# Patient Record
Sex: Female | Born: 1970 | Race: Black or African American | Hispanic: No | Marital: Single | Smoking: Former smoker
Health system: Southern US, Community
[De-identification: ages and names within clinical notes are randomized; demographics above are authoritative.]

## PROBLEM LIST (undated history)

## (undated) DIAGNOSIS — F419 Anxiety disorder, unspecified: Secondary | ICD-10-CM

## (undated) DIAGNOSIS — F32A Depression, unspecified: Secondary | ICD-10-CM

## (undated) DIAGNOSIS — Z9989 Dependence on other enabling machines and devices: Secondary | ICD-10-CM

## (undated) DIAGNOSIS — E039 Hypothyroidism, unspecified: Secondary | ICD-10-CM

## (undated) DIAGNOSIS — G4733 Obstructive sleep apnea (adult) (pediatric): Secondary | ICD-10-CM

## (undated) DIAGNOSIS — M1990 Unspecified osteoarthritis, unspecified site: Secondary | ICD-10-CM

## (undated) DIAGNOSIS — D649 Anemia, unspecified: Secondary | ICD-10-CM

## (undated) DIAGNOSIS — K219 Gastro-esophageal reflux disease without esophagitis: Secondary | ICD-10-CM

## (undated) DIAGNOSIS — Z973 Presence of spectacles and contact lenses: Secondary | ICD-10-CM

## (undated) DIAGNOSIS — R51 Headache: Secondary | ICD-10-CM

## (undated) DIAGNOSIS — R0602 Shortness of breath: Secondary | ICD-10-CM

## (undated) DIAGNOSIS — Z9689 Presence of other specified functional implants: Secondary | ICD-10-CM

## (undated) DIAGNOSIS — R42 Dizziness and giddiness: Secondary | ICD-10-CM

## (undated) DIAGNOSIS — Z8709 Personal history of other diseases of the respiratory system: Secondary | ICD-10-CM

## (undated) DIAGNOSIS — F329 Major depressive disorder, single episode, unspecified: Secondary | ICD-10-CM

## (undated) DIAGNOSIS — I209 Angina pectoris, unspecified: Secondary | ICD-10-CM

## (undated) DIAGNOSIS — I10 Essential (primary) hypertension: Secondary | ICD-10-CM

## (undated) HISTORY — PX: SHOULDER SURGERY: SHX246

## (undated) HISTORY — PX: CHOLECYSTECTOMY: SHX55

## (undated) HISTORY — PX: ROTATOR CUFF REPAIR: SHX139

## (undated) HISTORY — PX: KNEE ARTHROSCOPY: SHX127

## (undated) HISTORY — PX: ENDOMETRIAL ABLATION: SHX621

## (undated) HISTORY — PX: BACK SURGERY: SHX140

---
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
## 1994-09-17 HISTORY — PX: TUBAL LIGATION: SHX77

## 1998-12-21 ENCOUNTER — Ambulatory Visit (HOSPITAL_BASED_OUTPATIENT_CLINIC_OR_DEPARTMENT_OTHER): Admission: RE | Admit: 1998-12-21 | Discharge: 1998-12-21 | Payer: Self-pay | Admitting: Orthopedic Surgery

## 1999-02-22 ENCOUNTER — Other Ambulatory Visit: Admission: RE | Admit: 1999-02-22 | Discharge: 1999-02-22 | Payer: Self-pay | Admitting: Family Medicine

## 1999-04-10 ENCOUNTER — Other Ambulatory Visit: Admission: RE | Admit: 1999-04-10 | Discharge: 1999-04-10 | Payer: Self-pay | Admitting: Family Medicine

## 2003-11-29 ENCOUNTER — Emergency Department (HOSPITAL_COMMUNITY): Admission: EM | Admit: 2003-11-29 | Discharge: 2003-11-29 | Payer: Self-pay | Admitting: Emergency Medicine

## 2006-12-17 ENCOUNTER — Encounter: Admission: RE | Admit: 2006-12-17 | Discharge: 2007-01-28 | Payer: Self-pay | Admitting: Orthopedic Surgery

## 2008-12-18 ENCOUNTER — Emergency Department (HOSPITAL_COMMUNITY): Admission: EM | Admit: 2008-12-18 | Discharge: 2008-12-18 | Payer: Self-pay | Admitting: Emergency Medicine

## 2009-02-12 ENCOUNTER — Inpatient Hospital Stay (HOSPITAL_COMMUNITY): Admission: EM | Admit: 2009-02-12 | Discharge: 2009-02-14 | Payer: Self-pay | Admitting: Emergency Medicine

## 2009-02-12 ENCOUNTER — Ambulatory Visit: Payer: Self-pay | Admitting: Internal Medicine

## 2009-02-12 ENCOUNTER — Emergency Department (HOSPITAL_COMMUNITY): Admission: EM | Admit: 2009-02-12 | Discharge: 2009-02-12 | Payer: Self-pay | Admitting: Emergency Medicine

## 2009-05-22 ENCOUNTER — Emergency Department (HOSPITAL_COMMUNITY): Admission: EM | Admit: 2009-05-22 | Discharge: 2009-05-22 | Payer: Self-pay | Admitting: Emergency Medicine

## 2009-06-28 ENCOUNTER — Emergency Department (HOSPITAL_COMMUNITY): Admission: EM | Admit: 2009-06-28 | Discharge: 2009-06-28 | Payer: Self-pay | Admitting: Emergency Medicine

## 2009-08-18 ENCOUNTER — Emergency Department (HOSPITAL_COMMUNITY): Admission: EM | Admit: 2009-08-18 | Discharge: 2009-08-18 | Payer: Self-pay | Admitting: Emergency Medicine

## 2009-09-17 HISTORY — PX: JOINT REPLACEMENT: SHX530

## 2009-10-07 ENCOUNTER — Other Ambulatory Visit: Admission: RE | Admit: 2009-10-07 | Discharge: 2009-10-07 | Payer: Self-pay | Admitting: Obstetrics and Gynecology

## 2009-10-13 ENCOUNTER — Ambulatory Visit (HOSPITAL_COMMUNITY): Admission: RE | Admit: 2009-10-13 | Discharge: 2009-10-13 | Payer: Self-pay | Admitting: Obstetrics and Gynecology

## 2009-11-17 ENCOUNTER — Emergency Department (HOSPITAL_COMMUNITY): Admission: EM | Admit: 2009-11-17 | Discharge: 2009-11-17 | Payer: Self-pay | Admitting: Emergency Medicine

## 2009-11-21 ENCOUNTER — Encounter: Admission: RE | Admit: 2009-11-21 | Discharge: 2010-02-19 | Payer: Self-pay | Admitting: Orthopedic Surgery

## 2009-12-20 ENCOUNTER — Ambulatory Visit (HOSPITAL_COMMUNITY): Admission: RE | Admit: 2009-12-20 | Discharge: 2009-12-20 | Payer: Self-pay | Admitting: Obstetrics and Gynecology

## 2010-03-15 ENCOUNTER — Emergency Department (HOSPITAL_COMMUNITY): Admission: EM | Admit: 2010-03-15 | Discharge: 2010-03-15 | Payer: Self-pay | Admitting: Emergency Medicine

## 2010-04-15 ENCOUNTER — Emergency Department (HOSPITAL_COMMUNITY): Admission: EM | Admit: 2010-04-15 | Discharge: 2010-04-15 | Payer: Self-pay | Admitting: Emergency Medicine

## 2010-05-29 ENCOUNTER — Encounter
Admission: RE | Admit: 2010-05-29 | Discharge: 2010-08-27 | Payer: Self-pay | Source: Home / Self Care | Admitting: Orthopedic Surgery

## 2010-06-27 ENCOUNTER — Encounter: Admission: RE | Admit: 2010-06-27 | Discharge: 2010-06-27 | Payer: Self-pay | Admitting: Orthopedic Surgery

## 2010-08-30 ENCOUNTER — Emergency Department (HOSPITAL_COMMUNITY)
Admission: EM | Admit: 2010-08-30 | Discharge: 2010-08-31 | Payer: Self-pay | Source: Home / Self Care | Admitting: Emergency Medicine

## 2010-09-20 ENCOUNTER — Encounter
Admission: RE | Admit: 2010-09-20 | Discharge: 2010-10-17 | Payer: Self-pay | Source: Home / Self Care | Attending: Orthopedic Surgery | Admitting: Orthopedic Surgery

## 2010-11-10 ENCOUNTER — Other Ambulatory Visit: Payer: Self-pay | Admitting: Orthopedic Surgery

## 2010-11-10 DIAGNOSIS — M25512 Pain in left shoulder: Secondary | ICD-10-CM

## 2010-11-12 ENCOUNTER — Ambulatory Visit
Admission: RE | Admit: 2010-11-12 | Discharge: 2010-11-12 | Disposition: A | Discharge status: Home / Self Care | Payer: Medicaid Other | Source: Ambulatory Visit | Attending: Orthopedic Surgery | Admitting: Orthopedic Surgery

## 2010-11-12 DIAGNOSIS — M25512 Pain in left shoulder: Secondary | ICD-10-CM

## 2010-11-27 LAB — CBC
HCT: 35.9 % — ABNORMAL LOW (ref 36.0–46.0)
Hemoglobin: 11.9 g/dL — ABNORMAL LOW (ref 12.0–15.0)
MCH: 26.8 pg (ref 26.0–34.0)
MCHC: 33.1 g/dL (ref 30.0–36.0)
MCV: 80.9 fL (ref 78.0–100.0)
Platelets: 396 10*3/uL (ref 150–400)
RBC: 4.44 MIL/uL (ref 3.87–5.11)
RDW: 14.9 % (ref 11.5–15.5)
WBC: 8 10*3/uL (ref 4.0–10.5)

## 2010-11-27 LAB — BASIC METABOLIC PANEL
BUN: 9 mg/dL (ref 6–23)
CO2: 27 mEq/L (ref 19–32)
Calcium: 9.2 mg/dL (ref 8.4–10.5)
Chloride: 98 mEq/L (ref 96–112)
Creatinine, Ser: 0.95 mg/dL (ref 0.4–1.2)
GFR calc Af Amer: >60 mL/min (ref >60)
GFR calc non Af Amer: >60 mL/min (ref >60)
Glucose, Bld: 101 mg/dL — ABNORMAL HIGH (ref 70–99)
Potassium: 2.7 mEq/L — CL (ref 3.5–5.1)
Sodium: 137 mEq/L (ref 135–145)

## 2010-11-27 LAB — DIFFERENTIAL
Basophils Absolute: 0 10*3/uL (ref 0.0–0.1)
Basophils Relative: 0 % (ref 0–1)
Eosinophils Absolute: 0.3 10*3/uL (ref 0.0–0.7)
Eosinophils Relative: 4 % (ref 0–5)
Lymphocytes Relative: 39 % (ref 12–46)
Lymphs Abs: 3.1 10*3/uL (ref 0.7–4.0)
Monocytes Absolute: 0.3 10*3/uL (ref 0.1–1.0)
Monocytes Relative: 4 % (ref 3–12)
Neutro Abs: 4.3 10*3/uL (ref 1.7–7.7)
Neutrophils Relative %: 54 % (ref 43–77)

## 2010-12-06 LAB — CBC
HCT: 34.5 % — ABNORMAL LOW (ref 36.0–46.0)
Hemoglobin: 11.4 g/dL — ABNORMAL LOW (ref 12.0–15.0)
MCHC: 33.1 g/dL (ref 30.0–36.0)
MCV: 81.1 fL (ref 78.0–100.0)
Platelets: 339 10*3/uL (ref 150–400)
RBC: 4.25 MIL/uL (ref 3.87–5.11)
RDW: 15 % (ref 11.5–15.5)
WBC: 6.8 10*3/uL (ref 4.0–10.5)

## 2010-12-06 LAB — BASIC METABOLIC PANEL
BUN: 10 mg/dL (ref 6–23)
CO2: 26 mEq/L (ref 19–32)
Calcium: 9.3 mg/dL (ref 8.4–10.5)
Chloride: 103 mEq/L (ref 96–112)
Creatinine, Ser: 0.94 mg/dL (ref 0.4–1.2)
GFR calc Af Amer: >60 mL/min (ref >60)
GFR calc non Af Amer: >60 mL/min (ref >60)
Glucose, Bld: 99 mg/dL (ref 70–99)
Potassium: 3.4 mEq/L — ABNORMAL LOW (ref 3.5–5.1)
Sodium: 138 mEq/L (ref 135–145)

## 2010-12-06 LAB — HCG, QUANTITATIVE, PREGNANCY: hCG, Beta Chain, Quant, S: <2 m[IU]/mL (ref <5)

## 2010-12-19 LAB — BASIC METABOLIC PANEL
BUN: 14 mg/dL (ref 6–23)
CO2: 27 mEq/L (ref 19–32)
Calcium: 9.3 mg/dL (ref 8.4–10.5)
Chloride: 102 mEq/L (ref 96–112)
Creatinine, Ser: 1.02 mg/dL (ref 0.4–1.2)
GFR calc Af Amer: >60 mL/min (ref >60)
GFR calc non Af Amer: >60 mL/min (ref >60)
Glucose, Bld: 111 mg/dL — ABNORMAL HIGH (ref 70–99)
Potassium: 3.9 mEq/L (ref 3.5–5.1)
Sodium: 137 mEq/L (ref 135–145)

## 2010-12-19 LAB — POCT CARDIAC MARKERS
CKMB, poc: <1 ng/mL — ABNORMAL LOW (ref 1.0–8.0)
Myoglobin, poc: 72.8 ng/mL (ref 12–200)
Troponin i, poc: <0.05 ng/mL (ref 0.00–0.09)

## 2010-12-21 LAB — URINALYSIS, ROUTINE W REFLEX MICROSCOPIC
Bilirubin Urine: NEGATIVE
Glucose, UA: NEGATIVE mg/dL
Hgb urine dipstick: NEGATIVE
Ketones, ur: NEGATIVE mg/dL
Nitrite: NEGATIVE
Protein, ur: NEGATIVE mg/dL
Specific Gravity, Urine: 1.025 (ref 1.005–1.030)
Urobilinogen, UA: 0.2 mg/dL (ref 0.0–1.0)
pH: 5.5 (ref 5.0–8.0)

## 2010-12-21 LAB — URINE MICROSCOPIC-ADD ON

## 2010-12-26 LAB — URINALYSIS, ROUTINE W REFLEX MICROSCOPIC
Bilirubin Urine: NEGATIVE
Glucose, UA: NEGATIVE mg/dL
Ketones, ur: 40 mg/dL — AB
Protein, ur: NEGATIVE mg/dL
Urobilinogen, UA: 0.2 mg/dL (ref 0.0–1.0)

## 2010-12-26 LAB — CBC
HCT: 32.7 % — ABNORMAL LOW (ref 36.0–46.0)
HCT: 32.3 % — ABNORMAL LOW (ref 36.0–46.0)
Hemoglobin: 11.1 g/dL — ABNORMAL LOW (ref 12.0–15.0)
MCHC: 33.9 g/dL (ref 30.0–36.0)
MCHC: 33.3 g/dL (ref 30.0–36.0)
MCV: 79.1 fL (ref 78.0–100.0)
MCV: 79.5 fL (ref 78.0–100.0)
Platelets: 296 10*3/uL (ref 150–400)
Platelets: 298 10*3/uL (ref 150–400)
RBC: 4.08 MIL/uL (ref 3.87–5.11)
RBC: 4.08 MIL/uL (ref 3.87–5.11)
RDW: 15.3 % (ref 11.5–15.5)
WBC: 13.2 10*3/uL — ABNORMAL HIGH (ref 4.0–10.5)

## 2010-12-26 LAB — COMPREHENSIVE METABOLIC PANEL
AST: 16 U/L (ref 0–37)
Albumin: 3.3 g/dL — ABNORMAL LOW (ref 3.5–5.2)
Alkaline Phosphatase: 59 U/L (ref 39–117)
BUN: 10 mg/dL (ref 6–23)
BUN: 9 mg/dL (ref 6–23)
CO2: 24 mEq/L (ref 19–32)
Calcium: 9 mg/dL (ref 8.4–10.5)
Calcium: 8.5 mg/dL (ref 8.4–10.5)
Chloride: 104 mEq/L (ref 96–112)
Creatinine, Ser: 0.93 mg/dL (ref 0.4–1.2)
GFR calc Af Amer: >60 mL/min (ref >60)
GFR calc non Af Amer: >60 mL/min (ref >60)
Glucose, Bld: 98 mg/dL (ref 70–99)
Potassium: 3.7 mEq/L (ref 3.5–5.1)
Total Bilirubin: 1 mg/dL (ref 0.3–1.2)
Total Protein: 7.7 g/dL (ref 6.0–8.3)
Total Protein: 7.9 g/dL (ref 6.0–8.3)

## 2010-12-26 LAB — C4 COMPLEMENT: Complement C4, Body Fluid: 29 mg/dL (ref 16–47)

## 2010-12-26 LAB — BASIC METABOLIC PANEL
BUN: 12 mg/dL (ref 6–23)
CO2: 24 mEq/L (ref 19–32)
Calcium: 8.4 mg/dL (ref 8.4–10.5)
Chloride: 107 mEq/L (ref 96–112)
Creatinine, Ser: 0.85 mg/dL (ref 0.4–1.2)

## 2010-12-26 LAB — GONOCOCCUS CULTURE

## 2010-12-26 LAB — URINE CULTURE: Colony Count: 40000

## 2010-12-26 LAB — HIV ANTIBODY (ROUTINE TESTING W REFLEX): HIV: NONREACTIVE

## 2010-12-26 LAB — DIFFERENTIAL
Basophils Relative: 0 % (ref 0–1)
Eosinophils Absolute: 0.1 10*3/uL (ref 0.0–0.7)
Monocytes Relative: 3 % (ref 3–12)
Neutro Abs: 10.4 10*3/uL — ABNORMAL HIGH (ref 1.7–7.7)
Neutrophils Relative %: 84 % — ABNORMAL HIGH (ref 43–77)

## 2010-12-26 LAB — URINE MICROSCOPIC-ADD ON

## 2010-12-26 LAB — GC/CHLAMYDIA PROBE AMP, URINE: Chlamydia, Swab/Urine, PCR: NEGATIVE

## 2010-12-26 LAB — PREGNANCY, URINE: Preg Test, Ur: NEGATIVE

## 2010-12-26 LAB — PROTIME-INR
INR: 1.1 (ref 0.00–1.49)
Prothrombin Time: 14.2 seconds (ref 11.6–15.2)

## 2011-01-26 DIAGNOSIS — R079 Chest pain, unspecified: Secondary | ICD-10-CM

## 2011-01-30 NOTE — Consult Note (Signed)
Marilyn [name], Marilyn [name]             ACCOUNT NO.:  [id number]   MEDICAL RECORD NO.:  [id number]          PATIENT TYPE:  INP   LOCATION:  3303                         FACILITY:  [facility]   PHYSICIAN:  Jefry H. Pollyann Kennedy, MD     DATE OF BIRTH:  11-18-1970   DATE OF CONSULTATION:  02/12/2009  DATE OF DISCHARGE:                                 CONSULTATION   REASON FOR CONSULTATION:  Possible epiglottitis.   HISTORY:  This is a 40 year old lady who for the past 3 days had  worsening sore throat and trouble swallowing.  She has had intermittent  dyspnea but has not been noted to have stridor.  She was sent from [facility] by ambulance.  She was keeping her saturations  of 100% on room air.  She denies fever.  She has been on lisinopril for  about a month.   EXAMINATION:  Obese lady in no distress.  She has a clear voice and no  inspiratory or expiratory stridor.  She is able to handle some  secretions.  She is sitting semi supine and breathing without  difficulty.   Lateral x-ray reveals some enlargement of the epiglottis.  There is no  obvious airway obstruction.   Flexible fiberoptic laryngoscopy was performed following topical Afrin  and Xylocaine application.  Examination reveals normal vocal cord  mobility without any obstruction of the airway.  There is pale edema of  the anterior surface of the epiglottis.  The laryngeal surface is  uninvolved.  Some similar edema of the vallecula mucosa.  There is no  pooling of secretions and no evidence of airway obstruction.  There are  no palpable neck masses.   IMPRESSION:  Angioedema probably ACE inhibitor related.  Recommended  admission to the hospital for observation by the Medical Service and for  monitoring of blood pressure and for discontinuation of lisinopril and  starting on new antihypertensive medication.  I will be available for  consultation if there is any worsening airway concerns.  As long as she  gets  better, she can be discharged when we know that she is having no  worsening symptoms and no airway obstruction.  If the airway obstruction  does become more apparent and progressed, then surgical intervention may  be necessary.      Jefry H. Pollyann Kennedy, MD  Electronically Signed     JHR/MEDQ  D:  02/12/2009  T:  02/13/2009  Job:  [id number]

## 2011-01-30 NOTE — Discharge Summary (Signed)
Marilyn [name], Marilyn [name]             ACCOUNT NO.:  [id number]   MEDICAL RECORD NO.:  [id number]          PATIENT TYPE:  INP   LOCATION:  5504                         FACILITY:  [facility]   PHYSICIAN:  Marilyn Dickens, Marilyn [name]     DATE OF BIRTH:  02/01/1971   DATE OF ADMISSION:  02/12/2009  DATE OF DISCHARGE:  02/14/2009                               DISCHARGE SUMMARY   DISCHARGE DIAGNOSES:  1. Angioedema, likely secondary to lisinopril.  2. Trichomonas infection in urine.  3. Hypertension.  4. Sore throat.  5. Anemia.  6. Asthma.   DISPOSITION AND FOLLOWUP:  The patient to follow up with her primary  care doctor at the [facility] within next week.  The patient is instructed to go to her primary care Marilyn [name] for  restarting her blood pressure medications.  The patient instructed to  not take lisinopril-like medication due to possible reaction that could  be life-threatening.   DISCHARGE MEDICATION:  1. Prednisone taper over next 6 days 10 mg 21 pack.  2. Advair 250/50 one puff twice a day.  3. Albuterol inhaler 1-2 puffs every 6 hours as needed.  4. Augmentin 875 mg for 7 days twice daily.  5. Benadryl 25 mg every 6-hour as needed.  6. Flagyl 500 mg total of 4 tablets take it all at one time.   The patient instructed to avoid sexual activity and completed treatment  of partner and await 1 week prior to resuming sexual activity.   PROCEDURES PERFORMED:  Flexible fiberoptic laryngoscopy done by Dr.  Pollyann [name] on Feb 12, 2009, examination showed normal vocal cord mobility  without any obstruction of the airway, pale edema of anterior surface of  epiglottis.  Laryngeal surface in uninvolved.   Neck x-ray soft tissue with thickened epiglottitis worrisome for  epiglottitis.  Chest x-ray on Feb 12, 2009, no acute disease.   CONSULTATIONS:  Marilyn H. Marilyn Kennedy, Marilyn [name], ENT   ADMITTING HISTORY AND PHYSICAL:  The patient is a 40 year old African  American female with past  medical history of asthma since childhood;  hypertension, treated intermittently, recent treatment for community-  acquired pneumonia with azithromycin about a month prior to this  presentation.  The patient presented on this occasion with complaint of  increasing throat pain and feeling of choking.  The patient started  having some throat pain and sore-throat like symptoms about a week prior  to this admission.  This pain was intermittent and very minimal in the  initial stage.  The pain gradually started to progress.  The patient  went to bed night prior to admission without significant symptoms.  However, she woke up around 2 in the night with severe shortness of  breath and feeling of choking.  The patient also has severe throat pain.  The patient also reported neck swelling.  The patient attempted taking  albuterol inhaler, which did not produce any relief.  The patient went  to [facility] Emergency Department and was further evaluated.  The  patient was thought to have epiglottitis with soft tissue swelling  appreciated on neck x-ray.  The patient was  therefore transferred to  [facility] Emergency Department.  The patient was evaluated promptly by  Dr. Pollyann [name] from ENT as well as the emergency room physician.  The patient  denied taking any new medications except prescribed by her physician.  The patient did not have significant history of food allergies; however  the patient does have severe reaction to tomatoes.  The patient did not  report any intermittent ingestion or accidental exposure.   PHYSICAL EXAMINATION:  VITAL SIGNS:  On admission; temperature 100.9,  blood pressure 136/80, pulse of 101, respiratory rate of 22, and O2  saturation 100% on room air.  GENERAL:  The patient was not in acute distress.  EYES:  Extraocular movements intact.  Pupils were round and reactive to  light.  No icterus.  ENT:  The patient had a soft, mild edematous neck.  No lymphadenopathy.  The  patient's oral mucosa was pink, but dry.  The patient had some  ulceration on posterior pharyngeal wall and hemorrhages.  The patient's  uvula was in middle without any gross swelling.  The patient did not  have tonsillar enlargement. There was contusion bilaterally.  RESPIRATORY:  No accessory muscles were used.  The patient spoke in full  sentences without any distress.  CARDIOVASCULAR:  S1 and S2 normal.  No murmur.  No regurgitation.  ABDOMEN:  Soft, nontender, and obese.  No hepatosplenomegaly.  EXTREMITIES:  No edema, cyanosis, or clubbing.  SKIN:  No itching.  No rash.  LYMPH NODE:  No lymphadenopathy.  MUSCULOSKELETAL:  No joint swelling or pain or redness.  NEUROLOGICAL:  Alert and oriented x3.  Cranial nerves II through XII are  intact.  No focal deficit.  PSYCH:  Appropriate.   The patient was on admission, white count of 12.3, hemoglobin of 11.1,  hematocrit of 32.7, MCV of 80.1, and platelet count 284.  Sodium 136,  potassium 3.7, chloride 103, bicarb 24, BUN 10, creatinine 0.96, and  glucose of 98.  AST of 14, ALT of 12, total protein 7.7, albumin of 3.4,  calcium 9.0, total bilirubin 0.8, and alk phosphatase 59.  PT was 14.2  and INR was 1.1.  Urine hCG pregnancy test negative.  Urinalysis  revealed negative for bilirubin, 40 of ketones, trace blood,  urobilirubin of 0.2, positive for nitrite and small leukocyte esterase.  Urine microscopy showed 3-6 wbc per HPF.  Urine also showed mucous with  Trichomonas.  C4 complement level was 29.  HIV was nonreactive.  Urine  culture was pending.  Rapid strep antigen test was negative.   HOSPITAL COURSE:  1. Angioedema.  The patient on admission had throat pain and swelling,      which was evaluated by Dr. Pollyann [name].  His optical laryngoscopy      findings were consistent with angioedema and not epiglottitis.  The      patient was given intravenous steroids with decongestant.  The      patient was provided antihistamines and  lisinopril was held with      presumptive diagnosis of angioedema, secondary to lisinopril.  The      patient responded to this management appropriately.  At the time of      discharge, the patient had no shortness of breath.  No neck      swelling or pain on swallowing.  The patient was also treated with      Augmentin orally, which will be continued for 7 days at the time of  discharge.  The patient now instructed not to take angiotensin      receptor inhibitor as well as ACE inhibitors.  2. Hypertension.  The patient was not continued on any hypertensive      medications during this hospitalization.  The patient's blood      pressure was normotensive.  The patient on discharge instructed to      follow up with primary care Marilyn [name] for restarting her choice of      medication.  3. Anemia.  The patient likely has iron-deficiency anemia, secondary      to menstruation.  The patient was advised about iron      supplementation.  The patient wish to discuss this with her primary      care Marilyn [name].  Therefore, no iron supplementation will provided at      the time of discharge.  4. Trichomonas in urine.  The patient was positive for Trichomonas in      the urine.  The patient did not report significant urinary      symptoms.  The patient was treated with Flagyl 500 mg 4 tablets x1      at the time of discharge.  The patient was screened for gonorrhea      and chlamydia.  The results are pending, the patient was negative      for HIV.  The patient will be further contacted if any of the      screening test, positive for complete antibiotic therapy.  The      patient also instructed to inform her partner and not to resume      sexual activity at least for 1 week after completed therapy of her      and her partner.  The patient pregnancy test was negative.  5. Asthma.  The patient did not have significant wheezing on admission      or on physical exam.  The patient was continued on Advair  and      albuterol therapy.  The patient at the time of discharge was      instructed to continue this therapy.  6. Vitals at the time of discharge:  Temperature 97.1, pulse 87,      respirations of 16, blood pressure of 133/77, and 99% oxygen      saturation on room air.      Clerance Lav, MD PhD  Electronically Signed      Marilyn Dickens, Marilyn [name]  Electronically Signed    RS/MEDQ  D:  02/14/2009  T:  02/15/2009  Job:  [id number]   cc:   [facility]  Ihor Austin, Marilyn [name]  Jeannett Senior. Marilyn Kennedy, Marilyn [name]

## 2011-03-28 ENCOUNTER — Encounter: Payer: Self-pay | Admitting: *Deleted

## 2011-03-28 ENCOUNTER — Emergency Department (HOSPITAL_COMMUNITY)
Admission: EM | Admit: 2011-03-28 | Discharge: 2011-03-28 | Disposition: A | Discharge status: Home / Self Care | Payer: Medicaid Other | Attending: Emergency Medicine | Admitting: Emergency Medicine

## 2011-03-28 ENCOUNTER — Emergency Department (HOSPITAL_COMMUNITY): Payer: Medicaid Other

## 2011-03-28 ENCOUNTER — Other Ambulatory Visit: Payer: Self-pay

## 2011-03-28 DIAGNOSIS — I10 Essential (primary) hypertension: Secondary | ICD-10-CM | POA: Insufficient documentation

## 2011-03-28 DIAGNOSIS — J45909 Unspecified asthma, uncomplicated: Secondary | ICD-10-CM | POA: Insufficient documentation

## 2011-03-28 DIAGNOSIS — Z87891 Personal history of nicotine dependence: Secondary | ICD-10-CM | POA: Insufficient documentation

## 2011-03-28 DIAGNOSIS — R0789 Other chest pain: Secondary | ICD-10-CM

## 2011-03-28 DIAGNOSIS — R071 Chest pain on breathing: Secondary | ICD-10-CM | POA: Insufficient documentation

## 2011-03-28 HISTORY — DX: Essential (primary) hypertension: I10

## 2011-03-28 HISTORY — DX: Unspecified osteoarthritis, unspecified site: M19.90

## 2011-03-28 MED ORDER — KETOROLAC TROMETHAMINE 60 MG/2ML IM SOLN
60.0000 mg | Freq: Once | INTRAMUSCULAR | Status: AC
  Administered 2011-03-28: 60 mg via INTRAMUSCULAR
  Filled 2011-03-28: qty 2

## 2011-03-28 MED ORDER — IBUPROFEN 600 MG PO TABS: 600.0000 mg | ORAL_TABLET | Freq: Four times a day (QID) | ORAL | Status: AC | PRN

## 2011-03-28 NOTE — ED Notes (Signed)
C/o CP for 2 days

## 2011-03-28 NOTE — ED Notes (Signed)
Md at bs to evaluate.

## 2011-03-28 NOTE — ED Notes (Signed)
Pt states she has had intermittent right sided chest pain x 2 days. Radiates to center of chest. Denies anything making pain better or worse. Does hurt on palpation. Heart sounds strong, s1 and s2 present with no extra heart sounds. States she has had some shortness of breath with episodes. Appears to be slightly winded with work of breathing. Able to speak sentences. Placed on 2l o2 [place]. Lung sounds clear in all fields. Denies any n/v/d.

## 2011-03-28 NOTE — ED Notes (Signed)
Into room to see pt. Pt denies any pain at this time. Nad. Call bell at bs. Given sprite per request. Denies any other needs.

## 2011-03-28 NOTE — ED Notes (Signed)
Remains resting sitting up in bed. Nad. Denies any needs at this time. Call bell at bs. Pain 3/10. Resting comfortably.

## 2011-03-28 NOTE — ED Provider Notes (Signed)
History     Chief Complaint  Patient presents with  . Chest Pain  . Cough   HPI Comments: Pt with hx of asthma in the past presents with R sied parasternal shapr, intermittent CP, nothing makes better or worse, is havbing no pain while talking during HPI.  Denies leg swelling, fever, cough, vomiting or abd pain.  She has not had this pain in the past.  She has not had any problems with her heart or lungs other than well controlled asthma.  Pain started 2 days ago but got worse 1 hour ago.  Goes and comes spontaneously and is not made worse with braething, movement.  Patient is a 40 y.o. female presenting with chest pain and cough. The history is provided by the patient.  Chest Pain Pertinent negatives for primary symptoms include no fever, no shortness of breath, no cough, no abdominal pain, no nausea and no vomiting.  Pertinent negatives for associated symptoms include no numbness and no weakness.    Cough Associated symptoms include chest pain. Pertinent negatives include no chills, no headaches, no sore throat and no shortness of breath.    Past Medical History  Diagnosis Date  . Hypertension   . Arthritis   . Asthma     Past Surgical History  Procedure Date  . Cesarean section   . Cholecystectomy   . Tubal ligation   . Knee arthroscopy     History reviewed. No pertinent family history.  History  Substance Use Topics  . Smoking status: Former Games developer  . Smokeless tobacco: Not on file  . Alcohol Use: No    OB History    Grav Para Term Preterm Abortions TAB SAB Ect Mult Living                  Review of Systems  Constitutional: Negative for fever and chills.  HENT: Negative for sore throat and neck pain.   Eyes: Negative for visual disturbance.  Respiratory: Negative for cough and shortness of breath.   Cardiovascular: Positive for chest pain.  Gastrointestinal: Negative for nausea, vomiting, abdominal pain and diarrhea.  Genitourinary: Negative for dysuria  and frequency.  Musculoskeletal: Negative for back pain.  Skin: Negative for rash.  Neurological: Negative for weakness, numbness and headaches.  Hematological: Negative for adenopathy.  Psychiatric/Behavioral: Negative for behavioral problems.    Physical Exam  BP 182/89  Pulse 70  Temp(Src) 98.8 F (37.1 C) (Oral)  Resp 18  Ht 5\' 3"  (1.6 m)  Wt 291 lb (131.997 kg)  BMI 51.55 kg/m2  SpO2 100%  Physical Exam  Constitutional: She appears well-developed and well-nourished. No distress.  HENT:  Head: Normocephalic and atraumatic.  Mouth/Throat: Oropharynx is clear and moist. No oropharyngeal exudate.  Eyes: Conjunctivae and EOM are normal. Pupils are equal, round, and reactive to light. Right eye exhibits no discharge. Left eye exhibits no discharge. No scleral icterus.  Neck: Normal range of motion. Neck supple. No JVD present. No thyromegaly present.  Cardiovascular: Normal rate, regular rhythm, normal heart sounds and intact distal pulses.  Exam reveals no gallop and no friction rub.   No murmur heard. Pulmonary/Chest: Effort normal and breath sounds normal. No respiratory distress. She has no wheezes. She has no rales. Tenderness:  very ttp on the R chest wall in the mid R parasternal location.  nio rash at this location.  Abdominal: Soft. Bowel sounds are normal. She exhibits no distension and no mass. There is no tenderness.  Musculoskeletal: Normal range  of motion. She exhibits no edema and no tenderness.  Lymphadenopathy:    She has no cervical adenopathy.  Neurological: She is alert. Coordination normal.  Skin: Skin is warm and dry. No rash noted. She is not diaphoretic. No erythema.  Psychiatric: She has a normal mood and affect. Her behavior is normal.    ED Course  Procedures  MDM Pt has acute chest pain with palpation which she states is reproducible - the ECG is normal and shows no ischemia.  CXR r/o ptx, toradol IM.  Reeval.  8:13 PM    ED ECG REPORT    Date: 03/28/2011   Rate: 67  Rhythm: normal sinus rhythm  QRS Axis: normal  Intervals: normal  ST/T Wave abnormalities: normal  Conduction Disutrbances:none  Narrative Interpretation:   Old EKG Reviewed: none available   CXR without acute findings - will d/c home.  Vida Roller, MD 03/28/11 2131

## 2011-03-28 NOTE — ED Notes (Signed)
Resting sitting up in bed. Nad. Denies any needs at this time. Pain 3/10. Call bell at bs. Equal chest rise and fall with no respiratory distress. Will continue to monitor.

## 2011-03-28 NOTE — ED Notes (Signed)
rocephin

## 2011-04-12 ENCOUNTER — Encounter (HOSPITAL_COMMUNITY): Payer: Self-pay | Admitting: *Deleted

## 2011-04-12 ENCOUNTER — Emergency Department (HOSPITAL_COMMUNITY)
Admission: EM | Admit: 2011-04-12 | Discharge: 2011-04-13 | Discharge status: Against Medical Advice | Payer: Medicaid Other | Source: Home / Self Care

## 2011-04-12 DIAGNOSIS — F341 Dysthymic disorder: Secondary | ICD-10-CM | POA: Insufficient documentation

## 2011-04-12 DIAGNOSIS — T63461A Toxic effect of venom of wasps, accidental (unintentional), initial encounter: Secondary | ICD-10-CM | POA: Insufficient documentation

## 2011-04-12 DIAGNOSIS — I10 Essential (primary) hypertension: Secondary | ICD-10-CM | POA: Insufficient documentation

## 2011-04-12 DIAGNOSIS — M7989 Other specified soft tissue disorders: Secondary | ICD-10-CM | POA: Insufficient documentation

## 2011-04-12 DIAGNOSIS — T6391XA Toxic effect of contact with unspecified venomous animal, accidental (unintentional), initial encounter: Secondary | ICD-10-CM | POA: Insufficient documentation

## 2011-04-12 DIAGNOSIS — Z87891 Personal history of nicotine dependence: Secondary | ICD-10-CM | POA: Insufficient documentation

## 2011-04-12 DIAGNOSIS — J45909 Unspecified asthma, uncomplicated: Secondary | ICD-10-CM | POA: Insufficient documentation

## 2011-04-12 NOTE — ED Notes (Signed)
Reports stung by bee 2 days ago to right index finger; c/o increased pain and swelling spreading to hand

## 2011-04-13 ENCOUNTER — Emergency Department (HOSPITAL_COMMUNITY)
Admission: EM | Admit: 2011-04-13 | Discharge: 2011-04-13 | Disposition: A | Discharge status: Home / Self Care | Payer: Medicaid Other | Attending: Emergency Medicine | Admitting: Emergency Medicine

## 2011-04-13 ENCOUNTER — Encounter (HOSPITAL_COMMUNITY): Payer: Self-pay | Admitting: *Deleted

## 2011-04-13 DIAGNOSIS — T63441A Toxic effect of venom of bees, accidental (unintentional), initial encounter: Secondary | ICD-10-CM

## 2011-04-13 MED ORDER — METHYLPREDNISOLONE SODIUM SUCC 125 MG IJ SOLR
125.0000 mg | Freq: Once | INTRAMUSCULAR | Status: AC
  Administered 2011-04-13: 125 mg via INTRAMUSCULAR
  Filled 2011-04-13: qty 2

## 2011-04-13 MED ORDER — FEXOFENADINE HCL 180 MG PO TABS: ORAL_TABLET | ORAL | Status: DC

## 2011-04-13 MED ORDER — LORATADINE 10 MG PO TABS
10.0000 mg | ORAL_TABLET | Freq: Once | ORAL | Status: AC
  Administered 2011-04-13: 10 mg via ORAL
  Filled 2011-04-13: qty 1

## 2011-04-13 NOTE — ED Provider Notes (Signed)
Medical screening examination/treatment/procedure(s) were performed by non-physician practitioner and as supervising physician I was immediately available for consultation/collaboration.   Selin Eisler L Nader Boys, MD 04/13/11 2309 

## 2011-04-13 NOTE — ED Provider Notes (Signed)
History     Chief Complaint  Patient presents with  . Insect Bite   HPI Comments: Pt states she sustained a bee sting to her back and to the right index finger 7/24. She noted continued swelling of the left index finger and hand. She denies any SOB, hives, difficulty speaking, or LOC. She does note  Burning , itching, and swelling. The swollen area is aggravated by movement an palpation. Relieved by ice packs. She has taken benadryl with some relief, but became concerned that the hand and finger are still swollen 3 days after the sting.  The history is provided by the patient.    Past Medical History  Diagnosis Date  . Hypertension   . Arthritis   . Asthma     Past Surgical History  Procedure Date  . Cesarean section   . Cholecystectomy   . Tubal ligation   . Knee arthroscopy     No family history on file.  History  Substance Use Topics  . Smoking status: Former Games developer  . Smokeless tobacco: Not on file  . Alcohol Use: No    OB History    Grav Para Term Preterm Abortions TAB SAB Ect Mult Living                  Review of Systems  Constitutional: Negative for activity change.       All ROS Neg except as noted in HPI  HENT: Negative for nosebleeds and neck pain.   Eyes: Negative for photophobia and discharge.  Respiratory: Negative for cough, shortness of breath and wheezing.   Cardiovascular: Negative for chest pain and palpitations.  Gastrointestinal: Negative for abdominal pain and blood in stool.  Genitourinary: Negative for dysuria, frequency and hematuria.  Musculoskeletal: Negative for back pain and arthralgias.  Skin: Negative.   Neurological: Negative for dizziness, seizures and speech difficulty.  Psychiatric/Behavioral: Negative for hallucinations and confusion.    Physical Exam  BP 140/75  Pulse 75  Temp(Src) 98.4 F (36.9 C) (Oral)  Resp 20  Ht 5\' 3"  (1.6 m)  Wt 290 lb (131.543 kg)  BMI 51.37 kg/m2  SpO2 98%  Physical Exam  Nursing note  and vitals reviewed. Constitutional: She is oriented to person, place, and time. She appears well-developed and well-nourished.  Non-toxic appearance.  HENT:  Head: Normocephalic.  Right Ear: Tympanic membrane and external ear normal.  Left Ear: Tympanic membrane and external ear normal.  Eyes: EOM and lids are normal. Pupils are equal, round, and reactive to light.  Neck: Normal range of motion. Neck supple. Carotid bruit is not present.  Cardiovascular: Normal rate, regular rhythm, normal heart sounds, intact distal pulses and normal pulses.   Pulmonary/Chest: Breath sounds normal. No respiratory distress. She has no wheezes. She has no rales.  Abdominal: Soft. Bowel sounds are normal. There is no tenderness. There is no guarding.  Musculoskeletal:       The proximal left index finger is swollen. The dorsal portion of the left hand is mildly swollen. Good ROM of all fingers. Good cap refill.  Lymphadenopathy:       Head (right side): No submandibular adenopathy present.       Head (left side): No submandibular adenopathy present.    She has no cervical adenopathy.  Neurological: She is alert and oriented to person, place, and time. She has normal strength. No cranial nerve deficit or sensory deficit.  Skin: Skin is warm and dry.  Psychiatric: She has a normal mood  and affect. Her speech is normal.    ED Course  Procedures  MDM I have reviewed nursing notes, vital signs, and all appropriate lab and imaging results for this patient.      Kathie Dike, [place] 04/13/11 1723

## 2011-04-13 NOTE — ED Notes (Signed)
Pt not in waiting room when called to treatment room. 

## 2011-04-13 NOTE — ED Notes (Signed)
Stung by a bee to left hand on Tuesday. States she was here last night and left after waiting 3.5 hours. Pt states swelling, itching, and burning to left hand.

## 2011-04-13 NOTE — ED Notes (Signed)
Reports bee sting to left index finger 3 days ago with increased swelling and pain to site

## 2011-05-08 ENCOUNTER — Encounter (HOSPITAL_COMMUNITY)
Admission: RE | Admit: 2011-05-08 | Discharge: 2011-05-08 | Disposition: A | Discharge status: Home / Self Care | Payer: Medicare Other | Source: Ambulatory Visit | Attending: Orthopedic Surgery | Admitting: Orthopedic Surgery

## 2011-05-08 LAB — COMPREHENSIVE METABOLIC PANEL
ALT: 17 U/L (ref 0–35)
AST: 18 U/L (ref 0–37)
Calcium: 9.9 mg/dL (ref 8.4–10.5)
Creatinine, Ser: 0.87 mg/dL (ref 0.50–1.10)
GFR calc Af Amer: >60 mL/min (ref >60)
Glucose, Bld: 91 mg/dL (ref 70–99)
Sodium: 140 mEq/L (ref 135–145)
Total Protein: 7.4 g/dL (ref 6.0–8.3)

## 2011-05-08 LAB — CBC
Hemoglobin: 11.5 g/dL — ABNORMAL LOW (ref 12.0–15.0)
MCH: 26.6 pg (ref 26.0–34.0)
MCV: 82 fL (ref 78.0–100.0)
RBC: 4.33 MIL/uL (ref 3.87–5.11)

## 2011-05-08 LAB — SURGICAL PCR SCREEN
MRSA, PCR: NEGATIVE
Staphylococcus aureus: NEGATIVE

## 2011-05-08 LAB — TYPE AND SCREEN
ABO/RH(D): O POS
Antibody Screen: NEGATIVE

## 2011-05-08 LAB — HCG, SERUM, QUALITATIVE: Preg, Serum: NEGATIVE

## 2011-05-16 ENCOUNTER — Inpatient Hospital Stay (HOSPITAL_COMMUNITY): Payer: Medicare Other

## 2011-05-16 ENCOUNTER — Inpatient Hospital Stay (HOSPITAL_COMMUNITY)
Admission: RE | Admit: 2011-05-16 | Discharge: 2011-05-21 | DRG: 470 | Disposition: A | Discharge status: Home Health | Payer: Medicare Other | Source: Ambulatory Visit | Attending: Orthopedic Surgery | Admitting: Orthopedic Surgery

## 2011-05-16 DIAGNOSIS — M1710 Unilateral primary osteoarthritis, unspecified knee: Principal | ICD-10-CM | POA: Diagnosis present

## 2011-05-16 DIAGNOSIS — Z6841 Body Mass Index (BMI) 40.0 and over, adult: Secondary | ICD-10-CM

## 2011-05-16 DIAGNOSIS — I10 Essential (primary) hypertension: Secondary | ICD-10-CM | POA: Diagnosis present

## 2011-05-16 DIAGNOSIS — J45909 Unspecified asthma, uncomplicated: Secondary | ICD-10-CM | POA: Diagnosis present

## 2011-05-16 DIAGNOSIS — Z01812 Encounter for preprocedural laboratory examination: Secondary | ICD-10-CM

## 2011-05-17 LAB — CBC
MCH: 26.3 pg (ref 26.0–34.0)
MCHC: 32 g/dL (ref 30.0–36.0)
Platelets: 274 10*3/uL (ref 150–400)
RBC: 3.34 MIL/uL — ABNORMAL LOW (ref 3.87–5.11)
RDW: 14.3 % (ref 11.5–15.5)

## 2011-05-17 LAB — PROTIME-INR: Prothrombin Time: 14 seconds (ref 11.6–15.2)

## 2011-05-17 LAB — BASIC METABOLIC PANEL
CO2: 25 mEq/L (ref 19–32)
Calcium: 8.5 mg/dL (ref 8.4–10.5)
Creatinine, Ser: 1.02 mg/dL (ref 0.50–1.10)
GFR calc Af Amer: >60 mL/min (ref >60)
GFR calc non Af Amer: 59 mL/min — ABNORMAL LOW (ref >60)
Sodium: 136 mEq/L (ref 135–145)

## 2011-05-18 LAB — CBC
Platelets: 253 10*3/uL (ref 150–400)
RBC: 3.1 MIL/uL — ABNORMAL LOW (ref 3.87–5.11)
RDW: 14.4 % (ref 11.5–15.5)
WBC: 11.7 10*3/uL — ABNORMAL HIGH (ref 4.0–10.5)

## 2011-05-18 LAB — PROTIME-INR
INR: 1.33 (ref 0.00–1.49)
Prothrombin Time: 16.7 seconds — ABNORMAL HIGH (ref 11.6–15.2)

## 2011-05-18 LAB — BASIC METABOLIC PANEL
Calcium: 8.9 mg/dL (ref 8.4–10.5)
GFR calc Af Amer: >60 mL/min (ref >60)
GFR calc non Af Amer: >60 mL/min (ref >60)
Potassium: 3.7 mEq/L (ref 3.5–5.1)
Sodium: 138 mEq/L (ref 135–145)

## 2011-05-19 LAB — CBC
MCH: 26.4 pg (ref 26.0–34.0)
MCV: 83.3 fL (ref 78.0–100.0)
Platelets: 279 10*3/uL (ref 150–400)
RBC: 3.11 MIL/uL — ABNORMAL LOW (ref 3.87–5.11)
RDW: 14.6 % (ref 11.5–15.5)
WBC: 9.5 10*3/uL (ref 4.0–10.5)

## 2011-05-19 LAB — BASIC METABOLIC PANEL
CO2: 29 mEq/L (ref 19–32)
Calcium: 8.9 mg/dL (ref 8.4–10.5)
Creatinine, Ser: 0.83 mg/dL (ref 0.50–1.10)
GFR calc non Af Amer: >60 mL/min (ref >60)
Sodium: 136 mEq/L (ref 135–145)

## 2011-05-19 LAB — PROTIME-INR: INR: 1.83 — ABNORMAL HIGH (ref 0.00–1.49)

## 2011-05-20 LAB — PROTIME-INR: Prothrombin Time: 18 seconds — ABNORMAL HIGH (ref 11.6–15.2)

## 2011-05-21 LAB — PROTIME-INR
INR: 1.31 (ref 0.00–1.49)
Prothrombin Time: 16.5 seconds — ABNORMAL HIGH (ref 11.6–15.2)

## 2011-05-21 NOTE — Op Note (Signed)
Marilyn [name], Marilyn [name]             ACCOUNT NO.:  [id number]  MEDICAL RECORD NO.:  [id number]  LOCATION:  2550                         FACILITY:  [facility]  PHYSICIAN:  [name], MD     DATE OF BIRTH:  [birth year]  DATE OF PROCEDURE:  05/16/2011 DATE OF DISCHARGE:                              OPERATIVE REPORT   PREOPERATIVE DIAGNOSIS:  Osteoarthritis, left knee.  POSTOPERATIVE DIAGNOSIS:  Osteoarthritis, left knee.  PROCEDURES: 1. Left total knee arthroplasty with Zimmer components with a size 2     stemmed tibia with the combined length of 75 mm for the tibia, size     C femur, 29-mm patella with a 12-mm posterior stabilized rotating     platform poly. 2. Computer guidance for alignment.  SURGEON:  [name], MD  ANESTHESIA:  General plus femoral block plus popliteal block.  ESTIMATED BLOOD LOSS:  Minimal.  ANTIBIOTICS:  Two grams of Kefzol.  DRAINS:  None.  COMPLICATIONS:  None.  TOURNIQUET TIME:  None.  DISPOSITION:  To PACU in stable condition.  INDICTIONS FOR PROCEDURE:  The patient is a 40 year old woman with a BMI greater than 50 with osteoarthritis of her left knee.  She has undergone prolonged conservative therapy.  She has undergone arthroscopy, steroid injections, hyaluronic acid injections, activity modification, and walking aids.  She has failed prolonged conservative treatment and presents at this time for total knee arthroplasty.  Risks and benefits were discussed including an increased risk of the normal surgical risk due to the elevated BMI with an increased risk of infection, neurovascular injury, persistent pain, dislocation, DVT, pulmonary embolus, and need for additional surgery.  The patient states she understands and wished to proceed at this time.  DESCRIPTION OF PROCEDURE:  The patient was brought to OR room #10 after undergoing a femoral block.  She then underwent a general anesthetic. After adequate level of anesthesia was  obtained, the patient's left lower extremity was prepped using DuraPrep and draped into a sterile field.  Marilyn [name] was used to cover all exposed skin.  A midline incision was made and carried down to the medial parapatellar retinacular incision.  Attention was first focused on the femur.  10 mm was taken off the distal femur with IM guidance with 6 degrees of valgus. Attention was then focused on the tibia and 10 mm was taken off the tibia with external alignment, neutral varus and valgus, and a 7-degree posterior slope.  The tibia was sized for a size 2 and the keel punches were made for the long stem for the size 2.  The computer guidance tool was placed on the femur.  The rotation was adjusted to have a reading of 3 for median lateral with the thigh elevated and the leg hanging at 90 degrees.  This was then sized and pinned for a size C femur.  The size C femoral cutting block was placed and the cuts were made for the size C femur.  The box cutter was then placed and the box cut was made for the size E femur.  Lug cuts were made.  The patella was resurfaced and sized for a size 29-mm patella.  The tibia was  trialed with a 12-mm poly tray and this had stable varus and valgus stress.  The knee was irrigated with pulsatile lavage.  A popliteal block was provided with 60 mL of 0.25% Marcaine plain.  Care was taken not to have an intravascular injection.  The tibial and femoral component was cemented in place.  All loose cement was removed.  The patella was clamped and cemented in place.  It was difficult placing the poly tray due to tightness immediately and this was waited until the cement hardened.  After further freeing of the attachment of the MCL medially, we were able to obtain a proper fit with the 12-mm poly tray.  There was laxity of the MCL due to the elevation of the insertion.  There was no injury to the MCL.  The patient would be placed in a knee immobilizer with knee extension  to allow this to heal back.  The wound was irrigated with pulsatile lavage throughout the case.  Tourniquet was not used. Hemostasis was obtained.  The retinacula was closed using #1 Vicryl. Subcu was closed using 0 Vicryl.  Skin was closed using approximating staples.  The wound was covered with Adaptic orthopedic sponges, ABD dressing, Webril, and Coban.  The patient was placed in a knee immobilizer, extubated, and taken to PACU in stable condition.     [name], MD     MVD/MEDQ  D:  05/16/2011  T:  05/16/2011  Job:  [id number]  Electronically Signed by Aldean Baker MD on 05/21/2011 06:44:44 AM

## 2011-05-22 ENCOUNTER — Emergency Department (HOSPITAL_COMMUNITY)
Admission: EM | Admit: 2011-05-22 | Discharge: 2011-05-22 | Disposition: A | Discharge status: Home / Self Care | Payer: Medicaid Other | Attending: Emergency Medicine | Admitting: Emergency Medicine

## 2011-05-22 ENCOUNTER — Encounter (HOSPITAL_COMMUNITY): Payer: Self-pay

## 2011-05-22 DIAGNOSIS — J45909 Unspecified asthma, uncomplicated: Secondary | ICD-10-CM | POA: Insufficient documentation

## 2011-05-22 DIAGNOSIS — Z87891 Personal history of nicotine dependence: Secondary | ICD-10-CM | POA: Insufficient documentation

## 2011-05-22 DIAGNOSIS — K5900 Constipation, unspecified: Secondary | ICD-10-CM | POA: Insufficient documentation

## 2011-05-22 DIAGNOSIS — I10 Essential (primary) hypertension: Secondary | ICD-10-CM | POA: Insufficient documentation

## 2011-05-22 DIAGNOSIS — R1084 Generalized abdominal pain: Secondary | ICD-10-CM | POA: Insufficient documentation

## 2011-05-22 DIAGNOSIS — M129 Arthropathy, unspecified: Secondary | ICD-10-CM | POA: Insufficient documentation

## 2011-05-22 MED ORDER — POLYETHYLENE GLYCOL 3350 17 G PO PACK: 17.0000 g | PACK | Freq: Every day | ORAL | Status: AC

## 2011-05-22 NOTE — ED Notes (Signed)
Patient assisted with bedpan by Lupita Leash, ED tech. ED tech reports patient had large BM, dark in color. Patient reporting only minimal amount of pain after BM. Rating pain 3/10 at present time.

## 2011-05-22 NOTE — ED Notes (Signed)
Pt reports had left knee replacement one week ago.  C/O no bm since Thursday.  Reports severe generalized abd pain.

## 2011-05-22 NOTE — ED Provider Notes (Addendum)
History    Marilyn [name] with abdominal pain. Worsening over past few days. Generalized. Crampy.  No appreciable exacerbating or relieving factors. Marilyn [name] just discharged from hospital after L total knee. No fever or chills. No n/v. Marilyn [name] had 2 large bowel movements prior to my evaluation. Currently reports feelngmuch better. CSN: [id number] Arrival date & time: 05/22/2011  1:31 PM  Chief Complaint  Patient presents with  . Abdominal Pain   Patient is a 40 y.o. female presenting with abdominal pain. The history is provided by the patient. No language interpreter was used.  Abdominal Pain The primary symptoms of the illness include abdominal pain. The primary symptoms of the illness do not include fever, fatigue, shortness of breath, vomiting, dysuria, vaginal discharge or vaginal bleeding. The onset of the illness was gradual. The problem has not changed since onset. Additional symptoms associated with the illness include constipation. Symptoms associated with the illness do not include chills, diaphoresis or frequency.    Past Medical History  Diagnosis Date  . Hypertension   . Arthritis   . Asthma     Past Surgical History  Procedure Date  . Cesarean section   . Cholecystectomy   . Tubal ligation   . Knee arthroscopy   . Joint replacement   . Rotator cuff repair     No family history on file.  History  Substance Use Topics  . Smoking status: Former Games developer  . Smokeless tobacco: Not on file  . Alcohol Use: No    OB History    Grav Para Term Preterm Abortions TAB SAB Ect Mult Living                  Review of Systems  Constitutional: Negative for fever, chills, diaphoresis and fatigue.  HENT: Negative.   Eyes: Negative.   Respiratory: Negative for cough and shortness of breath.   Cardiovascular: Negative.   Gastrointestinal: Positive for abdominal pain and constipation. Negative for vomiting and blood in stool.  Genitourinary: Negative for dysuria, frequency, vaginal bleeding and  vaginal discharge.  Musculoskeletal:       L knee pain, improving. Denies drainage or increased redness.  Psychiatric/Behavioral: Negative.   All other systems reviewed and are negative.    Physical Exam  BP 128/70  Pulse 95  Temp(Src) 97.9 F (36.6 C) (Oral)  Resp 22  Ht 5\' 3"  (1.6 m)  Wt 301 lb (136.533 kg)  BMI 53.32 kg/m2  SpO2 100%  Physical Exam  Nursing note and vitals reviewed. Constitutional: She appears well-developed and well-nourished.       Morbidly obese  HENT:  Head: Normocephalic.  Cardiovascular: Normal rate, regular rhythm and normal heart sounds.   Pulmonary/Chest: Effort normal and breath sounds normal. No respiratory distress.  Abdominal: Soft. There is no tenderness. There is no rebound.  Musculoskeletal:       L knee in immobilizer. Taken down and examined. Incision intact with drainage or significant erythema. Neurovascularly intact distally.  Neurological: She is alert. GCS eye subscore is 4. GCS verbal subscore is 5. GCS motor subscore is 6.  Skin: Skin is warm and dry.    ED Course  Procedures  MDM 40yF with generalized abdominal pain. Suspect likely secondary to constipation from recent surgery, narcotic pain meds and decreased mobility. Marilyn [name] had two large BMs while in ED and reports feeling much better. Abdominal exam benign. Discussed further w/u with Marilyn [name] and she is comfortable with plan to DC home with bowel regiment. Understands sign/symptoms to return for  immediate re-eval.      Raeford Razor, MD 05/26/11 1647  Raeford Razor, MD 05/28/11 [phone]

## 2011-05-26 NOTE — H&P (Signed)
40yf with generalized abdominal pain. Worsening over past few days. Pt discharged from hospital after L total knee.  Pt reports no BM in almost a week. Pain crampy and intermittent. No appreciable exacerbating or relieving factors. No fever or chills. Prior to pt being evaluated by me had 2 large bowel movements in ED and reports feeling much better. No urinary complaints. Reports knee pain improving. No n/v.

## 2011-06-07 NOTE — Discharge Summary (Signed)
  Marilyn [name], Marilyn [name]             ACCOUNT NO.:  [id number]  MEDICAL RECORD NO.:  [id number]  LOCATION:  5019                         FACILITY:  [facility]  PHYSICIAN:  [name], MD     DATE OF BIRTH:  02/22/1971  DATE OF ADMISSION:  05/16/2011 DATE OF DISCHARGE:  05/21/2011                              DISCHARGE SUMMARY   FINAL DIAGNOSES: 1. Osteoarthritis, left knee. 2. Obesity, BMI greater than 50.  Discharged to home in stable condition with home health physical therapy.  Follow up in the office in 2 weeks.  Prescriptions include her admission medications as per the medical reconciliation form as well as Coumadin for DVT prophylaxis and Percocet for pain.  The patient is also to wear a Bledsoe knee brace on the left to ensure there is no strain or injury to her collateral ligaments during her rehab process.  Plan to follow up in the office in 2 weeks.  It is okay for her to remove the brace during range of motion exercises.   [name], MD     MVD/MEDQ  D:  05/21/2011  T:  05/21/2011  Job:  [id number]  Electronically Signed by Aldean Baker MD on 06/07/2011 06:23:01 AM

## 2011-06-25 ENCOUNTER — Ambulatory Visit: Payer: Medicaid Other | Attending: Orthopedic Surgery | Admitting: Physical Therapy

## 2011-06-25 DIAGNOSIS — M25669 Stiffness of unspecified knee, not elsewhere classified: Secondary | ICD-10-CM | POA: Insufficient documentation

## 2011-06-25 DIAGNOSIS — R269 Unspecified abnormalities of gait and mobility: Secondary | ICD-10-CM | POA: Insufficient documentation

## 2011-06-25 DIAGNOSIS — IMO0001 Reserved for inherently not codable concepts without codable children: Secondary | ICD-10-CM | POA: Insufficient documentation

## 2011-06-25 DIAGNOSIS — M25569 Pain in unspecified knee: Secondary | ICD-10-CM | POA: Insufficient documentation

## 2011-06-25 DIAGNOSIS — R262 Difficulty in walking, not elsewhere classified: Secondary | ICD-10-CM | POA: Insufficient documentation

## 2011-06-25 DIAGNOSIS — Z96659 Presence of unspecified artificial knee joint: Secondary | ICD-10-CM | POA: Insufficient documentation

## 2011-07-25 ENCOUNTER — Other Ambulatory Visit: Payer: Self-pay | Admitting: Family Medicine

## 2011-07-25 DIAGNOSIS — M25561 Pain in right knee: Secondary | ICD-10-CM

## 2011-07-29 ENCOUNTER — Ambulatory Visit
Admission: RE | Admit: 2011-07-29 | Discharge: 2011-07-29 | Disposition: A | Discharge status: Home / Self Care | Payer: Medicaid Other | Source: Ambulatory Visit | Attending: Family Medicine | Admitting: Family Medicine

## 2011-07-29 DIAGNOSIS — M25561 Pain in right knee: Secondary | ICD-10-CM

## 2011-09-19 ENCOUNTER — Emergency Department (HOSPITAL_COMMUNITY)
Admission: EM | Admit: 2011-09-19 | Discharge: 2011-09-19 | Disposition: A | Discharge status: Home / Self Care | Payer: Medicaid Other | Attending: Emergency Medicine | Admitting: Emergency Medicine

## 2011-09-19 ENCOUNTER — Encounter (HOSPITAL_COMMUNITY): Payer: Self-pay | Admitting: *Deleted

## 2011-09-19 DIAGNOSIS — R32 Unspecified urinary incontinence: Secondary | ICD-10-CM | POA: Insufficient documentation

## 2011-09-19 DIAGNOSIS — S39012A Strain of muscle, fascia and tendon of lower back, initial encounter: Secondary | ICD-10-CM

## 2011-09-19 DIAGNOSIS — J45909 Unspecified asthma, uncomplicated: Secondary | ICD-10-CM | POA: Insufficient documentation

## 2011-09-19 DIAGNOSIS — N390 Urinary tract infection, site not specified: Secondary | ICD-10-CM

## 2011-09-19 DIAGNOSIS — M545 Low back pain, unspecified: Secondary | ICD-10-CM | POA: Insufficient documentation

## 2011-09-19 DIAGNOSIS — R350 Frequency of micturition: Secondary | ICD-10-CM | POA: Insufficient documentation

## 2011-09-19 DIAGNOSIS — R300 Dysuria: Secondary | ICD-10-CM | POA: Insufficient documentation

## 2011-09-19 DIAGNOSIS — X58XXXA Exposure to other specified factors, initial encounter: Secondary | ICD-10-CM | POA: Insufficient documentation

## 2011-09-19 DIAGNOSIS — R3915 Urgency of urination: Secondary | ICD-10-CM | POA: Insufficient documentation

## 2011-09-19 DIAGNOSIS — I10 Essential (primary) hypertension: Secondary | ICD-10-CM | POA: Insufficient documentation

## 2011-09-19 DIAGNOSIS — M129 Arthropathy, unspecified: Secondary | ICD-10-CM | POA: Insufficient documentation

## 2011-09-19 DIAGNOSIS — S335XXA Sprain of ligaments of lumbar spine, initial encounter: Secondary | ICD-10-CM | POA: Insufficient documentation

## 2011-09-19 DIAGNOSIS — M5380 Other specified dorsopathies, site unspecified: Secondary | ICD-10-CM | POA: Insufficient documentation

## 2011-09-19 DIAGNOSIS — Z79899 Other long term (current) drug therapy: Secondary | ICD-10-CM | POA: Insufficient documentation

## 2011-09-19 DIAGNOSIS — M546 Pain in thoracic spine: Secondary | ICD-10-CM | POA: Insufficient documentation

## 2011-09-19 LAB — URINALYSIS, ROUTINE W REFLEX MICROSCOPIC
Bilirubin Urine: NEGATIVE
Glucose, UA: NEGATIVE mg/dL
Hgb urine dipstick: NEGATIVE
Ketones, ur: NEGATIVE mg/dL
pH: 5.5 (ref 5.0–8.0)

## 2011-09-19 LAB — URINE MICROSCOPIC-ADD ON

## 2011-09-19 MED ORDER — CEPHALEXIN 500 MG PO CAPS: 500.0000 mg | ORAL_CAPSULE | Freq: Four times a day (QID) | ORAL | Status: AC

## 2011-09-19 MED ORDER — CEPHALEXIN 500 MG PO CAPS
500.0000 mg | ORAL_CAPSULE | Freq: Once | ORAL | Status: AC
Start: 2011-09-19 — End: 2011-09-19
  Administered 2011-09-19: 500 mg via ORAL

## 2011-09-19 MED ORDER — OXYCODONE-ACETAMINOPHEN 7.5-325 MG PO TABS: 1.0000 | ORAL_TABLET | ORAL | Status: DC | PRN

## 2011-09-19 MED ORDER — CEPHALEXIN 500 MG PO CAPS
ORAL_CAPSULE | ORAL | Status: AC
  Filled 2011-09-19: qty 1

## 2011-09-19 MED ORDER — HYDROMORPHONE HCL PF 1 MG/ML IJ SOLN
1.0000 mg | Freq: Once | INTRAMUSCULAR | Status: AC
  Administered 2011-09-19: 1 mg via INTRAMUSCULAR
  Filled 2011-09-19: qty 1

## 2011-09-19 NOTE — ED Notes (Signed)
Upper and lower back pain.  

## 2011-09-19 NOTE — ED Notes (Signed)
Med for pain given,  Pt aware that u/a needed.  Drinking sprite.

## 2011-09-19 NOTE — ED Notes (Signed)
Pain from upper back down  To low back,  Increased pain with movement.  Alert,

## 2011-09-22 NOTE — ED Provider Notes (Signed)
History     CSN: [id number]  Arrival date & time 09/19/11  1731   First MD Initiated Contact with Patient 09/19/11 2040      Chief Complaint  Patient presents with  . Back Pain    (Consider location/radiation/quality/duration/timing/severity/associated sxs/prior treatment) Patient is a 41 y.o. female presenting with back pain. The history is provided by the patient.  Back Pain  This is a recurrent problem. The current episode started yesterday. The problem occurs constantly. The problem has been gradually worsening. The pain is associated with no known injury (she reports known history of arthritis in her neck and lower back). The pain is present in the thoracic spine and lumbar spine. The quality of the pain is described as stabbing and aching. The pain does not radiate. The pain is at a severity of 8/10. The pain is the same all the time. Associated symptoms include dysuria. Pertinent negatives include no chest pain, no fever, no numbness, no headaches, no abdominal pain, no paresthesias, no paresis and no weakness. Associated symptoms comments: Has increased urinary frequency and urgency.  Denies pain with urination.  Had an episode of urinary incontinence yesterday when she was unable to get to the bathroom quickly enough secondary to pain.  Otherwise,  No loss of urinary control.. She has tried muscle relaxants and NSAIDs for the symptoms. The treatment provided no relief.    Past Medical History  Diagnosis Date  . Hypertension   . Arthritis   . Asthma     Past Surgical History  Procedure Date  . Cesarean section   . Cholecystectomy   . Tubal ligation   . Knee arthroscopy   . Joint replacement   . Rotator cuff repair     History reviewed. No pertinent family history.  History  Substance Use Topics  . Smoking status: Former Games developer  . Smokeless tobacco: Not on file  . Alcohol Use: No    OB History    Grav Para Term Preterm Abortions TAB SAB Ect Mult Living         Review of Systems  Constitutional: Negative for fever.  HENT: Negative for congestion, sore throat and neck pain.   Eyes: Negative.   Respiratory: Negative for chest tightness and shortness of breath.   Cardiovascular: Negative for chest pain.  Gastrointestinal: Negative for nausea and abdominal pain.  Genitourinary: Positive for dysuria and urgency.  Musculoskeletal: Positive for back pain. Negative for joint swelling and arthralgias.  Skin: Negative.  Negative for rash and wound.  Neurological: Negative for dizziness, weakness, light-headedness, numbness, headaches and paresthesias.  Hematological: Negative.   Psychiatric/Behavioral: Negative.     Allergies  Lisinopril; Bee venom; Codeine; Darvocet; and Tomato  Home Medications   Current Outpatient Rx  Name Route Sig Dispense Refill  . AMLODIPINE BESYLATE 5 MG PO TABS Oral Take 5 mg by mouth daily.      [name] DICLOFENAC SODIUM 75 MG PO TBEC Oral Take 75 mg by mouth 2 (two) times daily.      [name] HYDROCHLOROTHIAZIDE 25 MG PO TABS Oral Take 25 mg by mouth daily.      [name] LORATADINE 10 MG PO TABS Oral Take 10 mg by mouth daily.      [name] PROVENTIL HFA IN Inhalation Inhale 2 puffs into the lungs daily as needed. Asthma Symptoms     . CEPHALEXIN 500 MG PO CAPS Oral Take 1 capsule (500 mg total) by mouth 4 (four) times daily. 28 capsule 0  . CYCLOBENZAPRINE HCL  10 MG PO TABS Oral Take 5-10 mg by mouth 3 (three) times daily as needed. Muscle Spasms     . OXYCODONE-ACETAMINOPHEN 7.5-325 MG PO TABS Oral Take 1 tablet by mouth every 4 (four) hours as needed for pain. 20 tablet 0  . POLYETHYLENE GLYCOL 3350 PO PACK Oral Take 17 g by mouth daily.        BP 150/88  Pulse 73  Temp(Src) 98.7 F (37.1 C) (Oral)  Resp 20  SpO2 98%  LMP 12/17/2009  Physical Exam  Nursing note and vitals reviewed. Constitutional: She is oriented to person, place, and time. She appears well-developed and well-nourished.  HENT:  Head: Normocephalic.  Eyes:  Conjunctivae are normal.  Neck: Normal range of motion. Neck supple.  Cardiovascular: Regular rhythm and intact distal pulses.        Pedal pulses normal.  Pulmonary/Chest: Effort normal. She has no wheezes.  Abdominal: Soft. Bowel sounds are normal. She exhibits no distension and no mass.  Musculoskeletal: Normal range of motion. She exhibits no edema.       Thoracic back: She exhibits tenderness, pain and spasm. She exhibits no swelling and no edema.       Lumbar back: She exhibits tenderness. She exhibits no swelling, no edema and no spasm.  Neurological: She is alert and oriented to person, place, and time. She has normal strength. She displays no atrophy and no tremor. No cranial nerve deficit or sensory deficit. Gait normal.  Reflex Scores:      Patellar reflexes are 2+ on the right side and 2+ on the left side.      Achilles reflexes are 2+ on the right side and 2+ on the left side.      No strength deficit noted in hip and knee flexor and extensor muscle groups.  Ankle flexion and extension intact.  Ambulatory.  Equal grip strength.  Skin: Skin is warm and dry.  Psychiatric: She has a normal mood and affect.    ED Course  Procedures (including critical care time)  Labs Reviewed  URINALYSIS, ROUTINE W REFLEX MICROSCOPIC - Abnormal; Notable for the following:    Specific Gravity, Urine >1.030 (*)    Nitrite POSITIVE (*)    All other components within normal limits  URINE MICROSCOPIC-ADD ON - Abnormal; Notable for the following:    Squamous Epithelial / LPF FEW (*)    Bacteria, UA MANY (*)    All other components within normal limits  LAB REPORT - SCANNED   No results found.   1. Lumbar strain   2. Urinary tract infection       MDM  Keflex,  Oxycodone.  F/u pcp for recheck in few days if not improved,  Or if develops numbness,  Weakness,  Fever,  Vomiting.        Candis Musa, PA 09/22/11 1112

## 2011-09-22 NOTE — ED Provider Notes (Signed)
Medical screening examination/treatment/procedure(s) were performed by non-physician practitioner and as supervising physician I was immediately available for consultation/collaboration.  Nicoletta Dress. Colon Branch, MD 09/22/11 1137

## 2011-10-25 ENCOUNTER — Encounter (HOSPITAL_COMMUNITY): Payer: Self-pay | Admitting: *Deleted

## 2011-10-25 ENCOUNTER — Emergency Department (HOSPITAL_COMMUNITY): Payer: Medicare Other

## 2011-10-25 ENCOUNTER — Emergency Department (HOSPITAL_COMMUNITY)
Admission: EM | Admit: 2011-10-25 | Discharge: 2011-10-25 | Disposition: A | Discharge status: Home / Self Care | Payer: Medicare Other | Attending: Emergency Medicine | Admitting: Emergency Medicine

## 2011-10-25 DIAGNOSIS — J3489 Other specified disorders of nose and nasal sinuses: Secondary | ICD-10-CM | POA: Insufficient documentation

## 2011-10-25 DIAGNOSIS — J069 Acute upper respiratory infection, unspecified: Secondary | ICD-10-CM | POA: Insufficient documentation

## 2011-10-25 DIAGNOSIS — Z79899 Other long term (current) drug therapy: Secondary | ICD-10-CM | POA: Insufficient documentation

## 2011-10-25 DIAGNOSIS — J45909 Unspecified asthma, uncomplicated: Secondary | ICD-10-CM | POA: Insufficient documentation

## 2011-10-25 DIAGNOSIS — Z8739 Personal history of other diseases of the musculoskeletal system and connective tissue: Secondary | ICD-10-CM | POA: Insufficient documentation

## 2011-10-25 DIAGNOSIS — I10 Essential (primary) hypertension: Secondary | ICD-10-CM | POA: Insufficient documentation

## 2011-10-25 LAB — RAPID STREP SCREEN (MED CTR MEBANE ONLY): Streptococcus, Group A Screen (Direct): NEGATIVE

## 2011-10-25 MED ORDER — GUAIFENESIN-DM 100-10 MG/5ML PO SYRP: ORAL_SOLUTION | ORAL | Status: DC

## 2011-10-25 MED ORDER — HYDROCOD POLST-CHLORPHEN POLST 10-8 MG/5ML PO LQCR
5.0000 mL | Freq: Once | ORAL | Status: AC
  Administered 2011-10-25: 5 mL via ORAL
  Filled 2011-10-25: qty 5

## 2011-10-25 NOTE — ED Provider Notes (Addendum)
History     CSN: [id number]  Arrival date & time 10/25/11  2017   First MD Initiated Contact with Patient 10/25/11 2046      Chief Complaint  Patient presents with  . Nasal Congestion    (Consider location/radiation/quality/duration/timing/severity/associated sxs/prior treatment) HPI Comments: Nasal congestion and mild NP cough.  No fever or chills.  No SOB.  The history is provided by the patient. No language interpreter was used.    Past Medical History  Diagnosis Date  . Hypertension   . Arthritis   . Asthma     Past Surgical History  Procedure Date  . Cesarean section   . Cholecystectomy   . Tubal ligation   . Knee arthroscopy   . Joint replacement   . Rotator cuff repair     No family history on file.  History  Substance Use Topics  . Smoking status: Former Games developer  . Smokeless tobacco: Not on file  . Alcohol Use: No    OB History    Grav Para Term Preterm Abortions TAB SAB Ect Mult Living                  Review of Systems  Constitutional: Negative for fever and chills.  HENT: Positive for congestion and sneezing.   Respiratory: Positive for cough. Negative for shortness of breath, wheezing and stridor.   Musculoskeletal: Positive for myalgias.  All other systems reviewed and are negative.    Allergies  Lisinopril; Bee venom; Codeine; Darvocet; and Tomato  Home Medications   Current Outpatient Rx  Name Route Sig Dispense Refill  . PROVENTIL HFA IN Inhalation Inhale 2 puffs into the lungs daily as needed. Asthma Symptoms     . AMLODIPINE BESYLATE 5 MG PO TABS Oral Take 5 mg by mouth every morning.     . CYCLOBENZAPRINE HCL 10 MG PO TABS Oral Take 5-10 mg by mouth 3 (three) times daily as needed. Muscle Spasms     . DICLOFENAC SODIUM 75 MG PO TBEC Oral Take 75 mg by mouth 2 (two) times daily.      [name] HYDROCHLOROTHIAZIDE 25 MG PO TABS Oral Take 25 mg by mouth every morning.     [name] LORATADINE 10 MG PO TABS Oral Take 10 mg by mouth daily.      .  GUAIFENESIN-DM 100-10 MG/5ML PO SYRP  10 mls po q 4-6 hrs prn cough 240 mL 0  . POLYETHYLENE GLYCOL 3350 PO PACK Oral Take 17 g by mouth daily.        BP 159/68  Pulse 82  Temp(Src) 98.7 F (37.1 C) (Oral)  Resp 20  Ht 5\' 3"  (1.6 m)  Wt 298 lb (135.172 kg)  BMI 52.79 kg/m2  SpO2 98%  Physical Exam  Constitutional: She is oriented to person, place, and time. She appears well-developed and well-nourished.  HENT:  Head: Normocephalic.  Right Ear: External ear normal.  Left Ear: External ear normal.  Eyes: EOM are normal.  Cardiovascular: Normal rate, regular rhythm and intact distal pulses.   Pulmonary/Chest: Effort normal and breath sounds normal. No accessory muscle usage. Not tachypneic. No respiratory distress. She has no decreased breath sounds. She has no wheezes. She has no rhonchi. She has no rales. She exhibits no tenderness.  Abdominal: Soft.  Lymphadenopathy:    She has no cervical adenopathy.  Neurological: She is alert and oriented to person, place, and time.  Skin: Skin is warm and dry.  Psychiatric: She has a normal mood  and affect. Her behavior is normal. Judgment and thought content normal.    ED Course  Procedures (including critical care time)   Labs Reviewed  RAPID STREP SCREEN   Dg Chest 2 View  10/25/2011  *RADIOLOGY REPORT*  Clinical Data: 41 year old female with productive cough.  CHEST - 2 VIEW  Comparison: 03/28/2011 and earlier.  Findings: Normal lung volumes. Normal cardiac size and mediastinal contours.  Visualized tracheal air column is within normal limits. The lungs are clear. No acute osseous abnormality identified.  IMPRESSION: Negative, no acute cardiopulmonary abnormality.  Original Report Authenticated By: Harley Hallmark, M.D.     1. URI, acute       MDM          Worthy Rancher, PA 10/25/11 2244  Worthy Rancher, [place] 11/27/11 [phone]

## 2011-10-25 NOTE — ED Notes (Signed)
Pt reports cough, congestion sinus pressure x 3 days

## 2011-10-26 NOTE — ED Provider Notes (Signed)
Medical screening examination/treatment/procedure(s) were performed by non-physician practitioner and as supervising physician I was immediately available for consultation/collaboration.  Nicoletta Dress. Colon Branch, MD 10/26/11 1246

## 2011-11-09 ENCOUNTER — Encounter (HOSPITAL_COMMUNITY): Payer: Self-pay | Admitting: Emergency Medicine

## 2011-11-09 ENCOUNTER — Emergency Department (HOSPITAL_COMMUNITY)
Admission: EM | Admit: 2011-11-09 | Discharge: 2011-11-09 | Disposition: A | Discharge status: Home / Self Care | Payer: Medicare Other | Attending: Emergency Medicine | Admitting: Emergency Medicine

## 2011-11-09 DIAGNOSIS — M25569 Pain in unspecified knee: Secondary | ICD-10-CM | POA: Insufficient documentation

## 2011-11-09 DIAGNOSIS — Z8739 Personal history of other diseases of the musculoskeletal system and connective tissue: Secondary | ICD-10-CM | POA: Insufficient documentation

## 2011-11-09 DIAGNOSIS — J45909 Unspecified asthma, uncomplicated: Secondary | ICD-10-CM | POA: Insufficient documentation

## 2011-11-09 DIAGNOSIS — M25561 Pain in right knee: Secondary | ICD-10-CM

## 2011-11-09 DIAGNOSIS — I10 Essential (primary) hypertension: Secondary | ICD-10-CM | POA: Insufficient documentation

## 2011-11-09 MED ORDER — HYDROCODONE-ACETAMINOPHEN 5-325 MG PO TABS
1.0000 | ORAL_TABLET | Freq: Once | ORAL | Status: DC
  Filled 2011-11-09: qty 1

## 2011-11-09 MED ORDER — IBUPROFEN 800 MG PO TABS
800.0000 mg | ORAL_TABLET | Freq: Once | ORAL | Status: DC
  Filled 2011-11-09: qty 1

## 2011-11-09 MED ORDER — HYDROCODONE-ACETAMINOPHEN 5-325 MG PO TABS: 1.0000 | ORAL_TABLET | Freq: Four times a day (QID) | ORAL | Status: AC | PRN

## 2011-11-09 NOTE — ED Notes (Signed)
Pt DC to home with the assistance of WC.

## 2011-11-09 NOTE — ED Provider Notes (Signed)
History     CSN: [id number]  Arrival date & time 11/09/11  1557   First MD Initiated Contact with Patient 11/09/11 [phone]      Chief Complaint  Patient presents with  . Knee Pain    (Consider location/radiation/quality/duration/timing/severity/associated sxs/prior treatment) HPI Comments: Pt has a known R knee meniscus tear dx on MRI recently.  She sees dr. Lajoyce Corners and called the  Office but they are unable to see her until 3 days from now.  She thinks she turned wrong while sleeping last night.  Patient is a 41 y.o. female presenting with knee pain. The history is provided by the patient. No language interpreter was used.  Knee Pain This is a new problem. The current episode started today. The problem occurs constantly. The problem has been unchanged. Pertinent negatives include no fever. Exacerbated by: weight bearing. She has tried nothing for the symptoms.    Past Medical History  Diagnosis Date  . Hypertension   . Arthritis   . Asthma     Past Surgical History  Procedure Date  . Cesarean section   . Cholecystectomy   . Tubal ligation   . Knee arthroscopy   . Joint replacement   . Rotator cuff repair     History reviewed. No pertinent family history.  History  Substance Use Topics  . Smoking status: Former Games developer  . Smokeless tobacco: Not on file  . Alcohol Use: No    OB History    Grav Para Term Preterm Abortions TAB SAB Ect Mult Living                  Review of Systems  Constitutional: Negative for fever.  Musculoskeletal:       Knee pain   All other systems reviewed and are negative.    Allergies  Lisinopril; Bee venom; Codeine; Darvocet; and Tomato  Home Medications   Current Outpatient Rx  Name Route Sig Dispense Refill  . PROVENTIL HFA IN Inhalation Inhale 2 puffs into the lungs daily as needed. Asthma Symptoms     . AMLODIPINE BESYLATE 5 MG PO TABS Oral Take 5 mg by mouth every morning.     . CYCLOBENZAPRINE HCL 10 MG PO TABS Oral Take  5-10 mg by mouth 3 (three) times daily as needed. Muscle Spasms     . DICLOFENAC SODIUM 75 MG PO TBEC Oral Take 75 mg by mouth 2 (two) times daily.      [name] HYDROCHLOROTHIAZIDE 25 MG PO TABS Oral Take 25 mg by mouth every morning.     [name] LORATADINE 10 MG PO TABS Oral Take 10 mg by mouth daily.      [name] POLYETHYLENE GLYCOL 3350 PO PACK Oral Take 17 g by mouth daily.        BP 150/93  Pulse 72  Temp(Src) 98.3 F (36.8 C) (Oral)  Resp 18  Ht 5\' 6"  (1.676 m)  Wt 280 lb (127.007 kg)  BMI 45.19 kg/m2  SpO2 100%  Physical Exam  Nursing note and vitals reviewed. Constitutional: She is oriented to person, place, and time. She appears well-developed and well-nourished. No distress.  HENT:  Head: Normocephalic and atraumatic.  Eyes: EOM are normal.  Neck: Normal range of motion.  Cardiovascular: Normal rate, regular rhythm and normal heart sounds.   Pulmonary/Chest: Effort normal and breath sounds normal.  Abdominal: Soft. She exhibits no distension. There is no tenderness.  Musculoskeletal:       Right knee: She exhibits decreased range  of motion. She exhibits no swelling.       Good examination difficult secondary to morbid obesity.  No swelling appreciated.  Skin intact.  Pain with movement and  Attempted weight bearing.  Neurological: She is alert and oriented to person, place, and time.  Skin: Skin is warm and dry.  Psychiatric: She has a normal mood and affect. Judgment normal.    ED Course  Procedures (including critical care time)  Labs Reviewed - No data to display No results found.   No diagnosis found.    MDM  Pt has a knee immobilizer and a walker at home.        Worthy Rancher, PA 11/09/11 1739

## 2011-11-09 NOTE — ED Notes (Signed)
Pt has had a MRI in oct and told she has a torn meniscus in right knee.  Has pain this am that she feels is from turning wrong in the bed last night.

## 2011-11-09 NOTE — ED Provider Notes (Signed)
Medical screening examination/treatment/procedure(s) were performed by non-physician practitioner and as supervising physician I was immediately available for consultation/collaboration.   Gerhard Munch, MD 11/09/11 2056

## 2011-11-09 NOTE — Discharge Instructions (Signed)
Knee Pain Knee pain can be a result of an injury or other medical conditions. Treatment will depend on the cause of your pain. HOME CARE  Only take medicine as told by your doctor.   Keep a healthy weight. Being overweight can make the knee hurt more.   Stretch before exercising or playing sports.   If there is constant knee pain, change the way you exercise. Ask your doctor for advice.   Make sure shoes fit well. Choose the right shoe for the sport or activity.   Protect your knees. Wear kneepads if needed.   Rest when you are tired.  GET HELP RIGHT AWAY IF:   Your knee pain does not stop.   Your knee pain does not get better.   Your knee joint feels hot to the touch.   You have a temperature by mouth above 102 F (38.9 C), not controlled by medicine.   Your baby is older than 3 months with a rectal temperature of 102 F (38.9 C) or higher.   Your baby is 42 months old or younger with a rectal temperature of 100.4 F (38 C) or higher.  MAKE SURE YOU:   Understand these instructions.   Will watch this condition.   Will get help right away if you are not doing well or get worse.  Document Released: 11/30/2008 Document Revised: 05/16/2011 Document Reviewed: 11/30/2008 [facility] Patient Information 2012 [place], [place].Cryotherapy Cryotherapy means treatment with cold. Ice or gel packs can be used to reduce both pain and swelling. Ice is the most helpful within the first 24 to 48 hours after an injury or flareup from overusing a muscle or joint. Sprains, strains, spasms, burning pain, shooting pain, and aches can all be eased with ice. Ice can also be used when recovering from surgery. Ice is effective, has very few side effects, and is safe for most people to use. PRECAUTIONS  Ice is not a safe treatment option for people with:  Raynaud's phenomenon. This is a condition affecting small blood vessels in the extremities. Exposure to cold may cause your problems to return.    Cold hypersensitivity. There are many forms of cold hypersensitivity, including:   Cold urticaria. Red, itchy hives appear on the skin when the tissues begin to warm after being iced.   Cold erythema. This is a red, itchy rash caused by exposure to cold.   Cold hemoglobinuria. Red blood cells break down when the tissues begin to warm after being iced. The hemoglobin that carry oxygen are passed into the urine because they cannot combine with blood proteins fast enough.   Numbness or altered sensitivity in the area being iced.  If you have any of the following conditions, do not use ice until you have discussed cryotherapy with your caregiver:  Heart conditions, such as arrhythmia, angina, or chronic heart disease.   High blood pressure.   Healing wounds or open skin in the area being iced.   Current infections.   Rheumatoid arthritis.   Poor circulation.   Diabetes.  Ice slows the blood flow in the region it is applied. This is beneficial when trying to stop inflamed tissues from spreading irritating chemicals to surrounding tissues. However, if you expose your skin to cold temperatures for too long or without the proper protection, you can damage your skin or nerves. Watch for signs of skin damage due to cold. HOME CARE INSTRUCTIONS Follow these tips to use ice and cold packs safely.  Place a dry or  damp towel between the ice and skin. A damp towel will cool the skin more quickly, so you may need to shorten the time that the ice is used.   For a more rapid response, add gentle compression to the ice.   Ice for no more than 10 to 20 minutes at a time. The bonier the area you are icing, the less time it will take to get the benefits of ice.   Check your skin after 5 minutes to make sure there are no signs of a poor response to cold or skin damage.   Rest 20 minutes or more in between uses.   Once your skin is numb, you can end your treatment. You can test numbness by very  lightly touching your skin. The touch should be so light that you do not see the skin dimple from the pressure of your fingertip. When using ice, most people will feel these normal sensations in this order: cold, burning, aching, and numbness.   Do not use ice on someone who cannot communicate their responses to pain, such as small children or people with dementia.  HOW TO MAKE AN ICE PACK Ice packs are the most common way to use ice therapy. Other methods include ice massage, ice baths, and cryo-sprays. Muscle creams that cause a cold, tingly feeling do not offer the same benefits that ice offers and should not be used as a substitute unless recommended by your caregiver. To make an ice pack, do one of the following:  Place crushed ice or a bag of frozen vegetables in a sealable plastic bag. Squeeze out the excess air. Place this bag inside another plastic bag. Slide the bag into a pillowcase or place a damp towel between your skin and the bag.   Mix 3 parts water with 1 part rubbing alcohol. Freeze the mixture in a sealable plastic bag. When you remove the mixture from the freezer, it will be slushy. Squeeze out the excess air. Place this bag inside another plastic bag. Slide the bag into a pillowcase or place a damp towel between your skin and the bag.  SEEK MEDICAL CARE IF:  You develop white spots on your skin. This may give the skin a blotchy (mottled) appearance.   Your skin turns blue or pale.   Your skin becomes waxy or hard.   Your swelling gets worse.  MAKE SURE YOU:   Understand these instructions.   Will watch your condition.   Will get help right away if you are not doing well or get worse.  Document Released: 04/30/2011 Document Reviewed: 04/26/2011 [facility] Patient Information 2012 [place], [place].    Take the pain med as directed.  Take ibuprofen 800 mg every 8 hrs with food.  Apply ice 20-30 min several times daily and elevate leg as much as possible.  Wear your knee  immobilizer and walker.  Bear weight as tolerated.  Follow up with dr. Lajoyce Corners as planned.

## 2011-11-22 ENCOUNTER — Other Ambulatory Visit (HOSPITAL_COMMUNITY): Payer: Self-pay | Admitting: Family Medicine

## 2011-11-22 DIAGNOSIS — Z139 Encounter for screening, unspecified: Secondary | ICD-10-CM

## 2011-11-23 ENCOUNTER — Ambulatory Visit (HOSPITAL_COMMUNITY)
Admission: RE | Admit: 2011-11-23 | Discharge: 2011-11-23 | Disposition: A | Discharge status: Home / Self Care | Payer: Medicare Other | Source: Ambulatory Visit | Attending: Family Medicine | Admitting: Family Medicine

## 2011-11-23 DIAGNOSIS — Z139 Encounter for screening, unspecified: Secondary | ICD-10-CM

## 2011-11-23 DIAGNOSIS — Z1231 Encounter for screening mammogram for malignant neoplasm of breast: Secondary | ICD-10-CM | POA: Insufficient documentation

## 2011-11-28 NOTE — ED Provider Notes (Signed)
Medical screening examination/treatment/procedure(s) were performed by non-physician practitioner and as supervising physician I was immediately available for consultation/collaboration.  Nicoletta Dress. Colon Branch, MD 11/28/11 1816

## 2012-02-16 ENCOUNTER — Emergency Department (HOSPITAL_COMMUNITY)
Admission: EM | Admit: 2012-02-16 | Discharge: 2012-02-16 | Disposition: A | Discharge status: Home / Self Care | Payer: Medicare Other | Attending: Emergency Medicine | Admitting: Emergency Medicine

## 2012-02-16 ENCOUNTER — Encounter (HOSPITAL_COMMUNITY): Payer: Self-pay | Admitting: Emergency Medicine

## 2012-02-16 ENCOUNTER — Emergency Department (HOSPITAL_COMMUNITY): Payer: Medicare Other

## 2012-02-16 DIAGNOSIS — M25519 Pain in unspecified shoulder: Secondary | ICD-10-CM | POA: Insufficient documentation

## 2012-02-16 DIAGNOSIS — R079 Chest pain, unspecified: Secondary | ICD-10-CM | POA: Insufficient documentation

## 2012-02-16 DIAGNOSIS — M542 Cervicalgia: Secondary | ICD-10-CM | POA: Insufficient documentation

## 2012-02-16 DIAGNOSIS — I10 Essential (primary) hypertension: Secondary | ICD-10-CM | POA: Insufficient documentation

## 2012-02-16 DIAGNOSIS — M25511 Pain in right shoulder: Secondary | ICD-10-CM

## 2012-02-16 DIAGNOSIS — J45909 Unspecified asthma, uncomplicated: Secondary | ICD-10-CM | POA: Insufficient documentation

## 2012-02-16 LAB — BASIC METABOLIC PANEL
BUN: 11 mg/dL (ref 6–23)
Chloride: 99 mEq/L (ref 96–112)
GFR calc Af Amer: 87 mL/min — ABNORMAL LOW (ref >90)
Glucose, Bld: 97 mg/dL (ref 70–99)
Potassium: 3.3 mEq/L — ABNORMAL LOW (ref 3.5–5.1)
Sodium: 135 mEq/L (ref 135–145)

## 2012-02-16 LAB — DIFFERENTIAL
Lymphs Abs: 2.9 10*3/uL (ref 0.7–4.0)
Monocytes Relative: 4 % (ref 3–12)
Neutro Abs: 3.3 10*3/uL (ref 1.7–7.7)
Neutrophils Relative %: 48 % (ref 43–77)

## 2012-02-16 LAB — CBC
Hemoglobin: 11.2 g/dL — ABNORMAL LOW (ref 12.0–15.0)
Platelets: 339 10*3/uL (ref 150–400)
RBC: 4.35 MIL/uL (ref 3.87–5.11)
WBC: 6.8 10*3/uL (ref 4.0–10.5)

## 2012-02-16 LAB — TROPONIN I: Troponin I: <0.3 ng/mL (ref <0.30)

## 2012-02-16 MED ORDER — NAPROXEN 500 MG PO TABS: 500.0000 mg | ORAL_TABLET | Freq: Two times a day (BID) | ORAL | Status: DC

## 2012-02-16 NOTE — Discharge Instructions (Signed)
Chest pain workup was negative no evidence of a blood clot in the lungs no evidence of an acute heart event no evidence of pneumonia or collapse of the lungs. Suspect based on your exam that there is a rotator cuff injury to the right shoulder area we'll treat you with Naprosyn take anti-inflammatory instructed make a point to followup with your primary care Dr. Next week may need orthopedic referral.

## 2012-02-16 NOTE — ED Provider Notes (Signed)
History  This chart was scribed for Marilyn Jakes, MD by Bennett Scrape. This patient was seen in room APA19/APA19 and the patient's care was started at 3:14PM.  CSN: [id number]  Arrival date & time 02/16/12  1427   First MD Initiated Contact with Patient 02/16/12 1514      Chief Complaint  Patient presents with  . Shoulder Pain  . Chest Pain    Patient is a 41 y.o. female presenting with chest pain. The history is provided by the patient. No language interpreter was used.  Chest Pain The chest pain began more than 2 weeks ago. Chest pain occurs constantly. The chest pain is improving. At its most intense, the pain is at 5/10. The pain is currently at 5/10. The quality of the pain is described as tightness. The pain radiates to the right shoulder and right neck. Pertinent negatives for primary symptoms include no fever, no cough, no nausea and no vomiting.  Pertinent negatives for associated symptoms include no weakness.     Marilyn [name] is a 41 y.o. female with a h/o HTN, arthritis and asthma who presents to the Emergency Department complaining of one month of gradual onset, gradually worsening right-sided chest pain described as a tight feeling that radiates to the right shoulder and right neck. She states that it originally started off as a coming and going pain that has become constant. She rates her pain a 5 out of 10 currently. The pain is worse with moving her right arm backwards. She called her PCP for advice but has not been seen by her PCP for the symptoms. She denies nausea, emesis and SOB as associated symptoms. She is a former smoker but denies alcohol use.  Dr. Lysbeth Galas is PCP.   Past Medical History  Diagnosis Date  . Hypertension   . Arthritis   . Asthma     Past Surgical History  Procedure Date  . Cesarean section   . Cholecystectomy   . Tubal ligation   . Knee arthroscopy   . Joint replacement   . Rotator cuff repair     History reviewed. No  pertinent family history.  History  Substance Use Topics  . Smoking status: Former Games developer  . Smokeless tobacco: Not on file  . Alcohol Use: No     Review of Systems  Constitutional: Negative for fever and chills.  HENT: Positive for neck pain (Mild right-sided neck pain). Negative for congestion.   Eyes: Negative for visual disturbance.  Respiratory: Negative for cough.   Cardiovascular: Positive for chest pain.  Gastrointestinal: Negative for nausea, vomiting and diarrhea.  Genitourinary: Negative for dysuria.  Musculoskeletal: Positive for myalgias (chronic) and back pain (chronic).  Skin: Negative for rash.  Neurological: Negative for weakness and headaches.  Hematological: Does not bruise/bleed easily.    Allergies  Lisinopril; Bee venom; Codeine; Darvocet; and Tomato  Home Medications   Current Outpatient Rx  Name Route Sig Dispense Refill  . PROVENTIL HFA IN Inhalation Inhale 2 puffs into the lungs daily as needed. Asthma Symptoms     . AMLODIPINE BESYLATE 5 MG PO TABS Oral Take 5 mg by mouth every morning.     [name] VITAMIN D 1000 UNITS PO TABS Oral Take 1,000 Units by mouth daily.    . CYCLOBENZAPRINE HCL 10 MG PO TABS Oral Take 5-10 mg by mouth 3 (three) times daily as needed. Muscle Spasms     . FERROUS SULFATE 325 (65 FE) MG PO TABS Oral Take  325 mg by mouth 2 (two) times daily.    [name] HYDROCHLOROTHIAZIDE 25 MG PO TABS Oral Take 25 mg by mouth every morning.     [name] LEVOTHYROXINE SODIUM 25 MCG PO TABS Oral Take 75 mcg by mouth daily.    [name] LORATADINE 10 MG PO TABS Oral Take 10 mg by mouth daily.      [name] ONE-DAILY MULTI VITAMINS PO TABS Oral Take 1 tablet by mouth daily.    . OXYBUTYNIN CHLORIDE ER 5 MG PO TB24 Oral Take 5 mg by mouth daily.    [name] POLYETHYLENE GLYCOL 3350 PO PACK Oral Take 17 g by mouth daily as needed. constipation    . NAPROXEN 500 MG PO TABS Oral Take 1 tablet (500 mg total) by mouth 2 (two) times daily. 14 tablet 0    Triage Vitals: BP 129/80  Pulse  85  Temp(Src) 98.7 F (37.1 C) (Oral)  Resp 20  Ht 5\' 3"  (1.6 m)  Wt 300 lb (136.079 kg)  BMI 53.14 kg/m2  SpO2 100%  Physical Exam  Nursing note and vitals reviewed. Constitutional: She is oriented to person, place, and time. She appears well-developed and well-nourished. No distress.  HENT:  Head: Normocephalic and atraumatic.       Moist mucus membranes  Eyes: Conjunctivae and EOM are normal.  Neck: Normal range of motion. Neck supple. No tracheal deviation present.  Cardiovascular: Normal rate and regular rhythm.   No murmur heard.      Radial pulse is 1+ on the right side  Pulmonary/Chest: Effort normal and breath sounds normal. No respiratory distress. She exhibits tenderness (Tenderness over the right upper chest area under the clavical toward the right shoulder).  Abdominal: Soft. Bowel sounds are normal. There is no tenderness.  Musculoskeletal: Normal range of motion. She exhibits tenderness.       Discomfort at 90 degrees of abduction of right arm, discomfort upon going back on distention of right arm  Lymphadenopathy:    She has no cervical adenopathy.  Neurological: She is alert and oriented to person, place, and time. No cranial nerve deficit.  Skin: Skin is warm and dry.  Psychiatric: She has a normal mood and affect. Her behavior is normal.    ED Course  Procedures (including critical care time)  DIAGNOSTIC STUDIES: Oxygen Saturation is 100% on room air, normal by my interpretation.    COORDINATION OF CARE: 3:27PM-Discussed treatment plan which includes x-rays and blood work with pt and pt agreed to plan.  Labs Reviewed  CBC - Abnormal; Notable for the following:    Hemoglobin 11.2 (*)    HCT 35.5 (*)    MCH 25.7 (*)    All other components within normal limits  DIFFERENTIAL - Abnormal; Notable for the following:    Eosinophils Relative 6 (*)    All other components within normal limits  BASIC METABOLIC PANEL - Abnormal; Notable for the following:     Potassium 3.3 (*)    GFR calc non Af Amer 75 (*)    GFR calc Af Amer 87 (*)    All other components within normal limits  D-DIMER, QUANTITATIVE  TROPONIN I   Dg Chest 2 View  02/16/2012  *RADIOLOGY REPORT*  Clinical Data: 42 year old female with chest pain, shoulder pain.  CHEST - 2 VIEW  Comparison: 10/25/2011 and earlier.  Findings:  Cardiac size and mediastinal contours are within normal limits.  Lung volumes are stable within normal limits. Visualized tracheal air column is within normal  limits.  No pneumothorax, pulmonary edema, pleural effusion or confluent pulmonary opacity. No acute osseous abnormality identified.  Right upper quadrant surgical clips.  IMPRESSION: No acute cardiopulmonary abnormality.  Original Report Authenticated By: Harley Hallmark, M.D.   Results for orders placed during the hospital encounter of 02/16/12  CBC      Component Value Range   WBC 6.8  4.0 - 10.5 (K/uL)   RBC 4.35  3.87 - 5.11 (MIL/uL)   Hemoglobin 11.2 (*) 12.0 - 15.0 (g/dL)   HCT 16.1 (*) 09.6 - 46.0 (%)   MCV 81.6  78.0 - 100.0 (fL)   MCH 25.7 (*) 26.0 - 34.0 (pg)   MCHC 31.5  30.0 - 36.0 (g/dL)   RDW 04.5  40.9 - 81.1 (%)   Platelets 339  150 - 400 (K/uL)  DIFFERENTIAL      Component Value Range   Neutrophils Relative 48  43 - 77 (%)   Neutro Abs 3.3  1.7 - 7.7 (K/uL)   Lymphocytes Relative 42  12 - 46 (%)   Lymphs Abs 2.9  0.7 - 4.0 (K/uL)   Monocytes Relative 4  3 - 12 (%)   Monocytes Absolute 0.3  0.1 - 1.0 (K/uL)   Eosinophils Relative 6 (*) 0 - 5 (%)   Eosinophils Absolute 0.4  0.0 - 0.7 (K/uL)   Basophils Relative 0  0 - 1 (%)   Basophils Absolute 0.0  0.0 - 0.1 (K/uL)  BASIC METABOLIC PANEL      Component Value Range   Sodium 135  135 - 145 (mEq/L)   Potassium 3.3 (*) 3.5 - 5.1 (mEq/L)   Chloride 99  96 - 112 (mEq/L)   CO2 25  19 - 32 (mEq/L)   Glucose, Bld 97  70 - 99 (mg/dL)   BUN 11  6 - 23 (mg/dL)   Creatinine, Ser 9.14  0.50 - 1.10 (mg/dL)   Calcium 9.1  8.4 - 78.2  (mg/dL)   GFR calc non Af Amer 75 (*) >90 (mL/min)   GFR calc Af Amer 87 (*) >90 (mL/min)  D-DIMER, QUANTITATIVE      Component Value Range   D-Dimer, Quant 0.27  0.00 - 0.48 (ug/mL-FEU)  TROPONIN I      Component Value Range   Troponin I <0.30  <0.30 (ng/mL)     Date: 02/16/2012  Rate: 65  Rhythm: normal sinus rhythm  QRS Axis: normal  Intervals: normal  ST/T Wave abnormalities: normal  Conduction Disutrbances:none  Narrative Interpretation:   Old EKG Reviewed: none available    1. Shoulder pain, acute, right   2. Chest pain       MDM  Patient with several week history of right-sided chest pain radiating into the right shoulder cardiac chest pain workup is negative no acute changes on EKG troponin is negative d-dimer negative for pulmonary embolism chest x-ray negative for pneumonia or pneumothorax. On examination symptoms very much consistent with a right rotator cuff injury patient has increased pain with abduction of the right shoulder up above 90 and with extension posteriorly. Will treat with anti-inflammatories have patient followup with her primary care doctor may need orthopedic evaluation.      I personally performed the services described in this documentation, which was scribed in my presence. The recorded information has been reviewed and considered.     Marilyn Jakes, MD 02/16/12 [phone]

## 2012-02-16 NOTE — ED Notes (Signed)
Discharge instructions reviewed with pt, questions answered. Pt verbalized understanding.  

## 2012-02-16 NOTE — ED Notes (Signed)
Pt c/o rt sided chest pain that radiates into the rt shoulder. Pt states the pain has been going on for about one month.

## 2012-03-13 ENCOUNTER — Other Ambulatory Visit: Payer: Self-pay | Admitting: Orthopedic Surgery

## 2012-03-13 DIAGNOSIS — M25511 Pain in right shoulder: Secondary | ICD-10-CM

## 2012-03-13 DIAGNOSIS — R531 Weakness: Secondary | ICD-10-CM

## 2012-03-19 ENCOUNTER — Ambulatory Visit
Admission: RE | Admit: 2012-03-19 | Discharge: 2012-03-19 | Disposition: A | Discharge status: Home / Self Care | Payer: Medicare Other | Source: Ambulatory Visit | Attending: Orthopedic Surgery | Admitting: Orthopedic Surgery

## 2012-03-19 DIAGNOSIS — M25511 Pain in right shoulder: Secondary | ICD-10-CM

## 2012-03-19 DIAGNOSIS — R531 Weakness: Secondary | ICD-10-CM

## 2012-03-28 ENCOUNTER — Other Ambulatory Visit (HOSPITAL_COMMUNITY): Payer: Self-pay | Admitting: Orthopedic Surgery

## 2012-04-08 ENCOUNTER — Encounter (HOSPITAL_COMMUNITY): Payer: Self-pay | Admitting: Pharmacy Technician

## 2012-04-17 ENCOUNTER — Encounter (HOSPITAL_COMMUNITY): Payer: Self-pay

## 2012-04-17 ENCOUNTER — Ambulatory Visit (HOSPITAL_COMMUNITY): Admission: RE | Admit: 2012-04-17 | Payer: Medicare Other | Source: Ambulatory Visit

## 2012-04-17 ENCOUNTER — Ambulatory Visit (HOSPITAL_COMMUNITY): Payer: Medicare Other

## 2012-04-17 ENCOUNTER — Encounter (HOSPITAL_COMMUNITY)
Admission: RE | Admit: 2012-04-17 | Discharge: 2012-04-17 | Disposition: A | Discharge status: Home / Self Care | Payer: Medicare Other | Source: Ambulatory Visit | Attending: Orthopedic Surgery | Admitting: Orthopedic Surgery

## 2012-04-17 HISTORY — DX: Anxiety disorder, unspecified: F41.9

## 2012-04-17 HISTORY — DX: Shortness of breath: R06.02

## 2012-04-17 HISTORY — DX: Anemia, unspecified: D64.9

## 2012-04-17 HISTORY — DX: Hypothyroidism, unspecified: E03.9

## 2012-04-17 LAB — COMPREHENSIVE METABOLIC PANEL
ALT: 21 U/L (ref 0–35)
AST: 19 U/L (ref 0–37)
Albumin: 3.6 g/dL (ref 3.5–5.2)
Alkaline Phosphatase: 72 U/L (ref 39–117)
CO2: 25 mEq/L (ref 19–32)
Chloride: 95 mEq/L — ABNORMAL LOW (ref 96–112)
Creatinine, Ser: 0.95 mg/dL (ref 0.50–1.10)
GFR calc non Af Amer: 72 mL/min — ABNORMAL LOW (ref >90)
Potassium: 3 mEq/L — ABNORMAL LOW (ref 3.5–5.1)
Total Bilirubin: 0.6 mg/dL (ref 0.3–1.2)

## 2012-04-17 LAB — CBC
MCV: 82.8 fL (ref 78.0–100.0)
Platelets: 324 10*3/uL (ref 150–400)
RBC: 4.77 MIL/uL (ref 3.87–5.11)
RDW: 15.4 % (ref 11.5–15.5)
WBC: 8.4 10*3/uL (ref 4.0–10.5)

## 2012-04-17 LAB — PROTIME-INR: INR: 1 (ref 0.00–1.49)

## 2012-04-17 LAB — TYPE AND SCREEN

## 2012-04-17 LAB — APTT: aPTT: 29 seconds (ref 24–37)

## 2012-04-17 NOTE — Pre-Procedure Instructions (Signed)
20 Marilyn [name]  04/17/2012   Your procedure is scheduled on:  04/23/12  Wednesday  Report to [facility] at [phone] AM.  Call this number if you have problems the morning of surgery: [phone]   Remember:   Do not eat food:After Midnight.  May have clear liquids:until Midnight .  Clear liquids include soda, tea, black coffee, apple or grape juice, broth.  Take these medicines the morning of surgery with A SIP OF WATER: use albuterol inhaler if needed and bring to hospital the da of surgery.  norvasc  Synthroid     Do not wear jewelry, make-up or nail polish.  Do not wear lotions, powders, or perfumes. You may wear deodorant.  Do not shave 48 hours prior to surgery. Men may shave face and neck.  Do not bring valuables to the hospital.  Contacts, dentures or bridgework may not be worn into surgery.  Leave suitcase in the car. After surgery it may be brought to your room.  For patients admitted to the hospital, checkout time is 11:00 AM the day of discharge.   Patients discharged the day of surgery will not be allowed to drive home.  Name and phone number of your driver:   Special Instructions: CHG Shower Use Special Wash: 1/2 bottle night before surgery and 1/2 bottle morning of surgery.   Please read over the following fact sheets that you were given: Pain Booklet, Coughing and Deep Breathing, Blood Transfusion Information, Lab Information, Total Joint Packet, MRSA Information and Surgical Site Infection Prevention

## 2012-04-22 MED ORDER — CEFAZOLIN SODIUM 10 G IJ SOLR
3.0000 g | INTRAMUSCULAR | Status: AC
  Administered 2012-04-23: 3 g via INTRAVENOUS
  Filled 2012-04-22: qty 3000

## 2012-04-23 ENCOUNTER — Inpatient Hospital Stay (HOSPITAL_COMMUNITY)
Admission: RE | Admit: 2012-04-23 | Discharge: 2012-04-27 | DRG: 470 | Disposition: A | Discharge status: Home Health | Payer: Medicare Other | Source: Ambulatory Visit | Attending: Orthopedic Surgery | Admitting: Orthopedic Surgery

## 2012-04-23 ENCOUNTER — Inpatient Hospital Stay (HOSPITAL_COMMUNITY): Payer: Medicare Other | Admitting: Anesthesiology

## 2012-04-23 ENCOUNTER — Encounter (HOSPITAL_COMMUNITY)
Admission: RE | Disposition: A | Discharge status: Home Health | Payer: Self-pay | Source: Ambulatory Visit | Attending: Orthopedic Surgery

## 2012-04-23 ENCOUNTER — Encounter (HOSPITAL_COMMUNITY): Payer: Self-pay | Admitting: Anesthesiology

## 2012-04-23 ENCOUNTER — Inpatient Hospital Stay (HOSPITAL_COMMUNITY): Payer: Medicare Other

## 2012-04-23 DIAGNOSIS — Z6841 Body Mass Index (BMI) 40.0 and over, adult: Secondary | ICD-10-CM

## 2012-04-23 DIAGNOSIS — D649 Anemia, unspecified: Secondary | ICD-10-CM | POA: Diagnosis present

## 2012-04-23 DIAGNOSIS — F411 Generalized anxiety disorder: Secondary | ICD-10-CM | POA: Diagnosis present

## 2012-04-23 DIAGNOSIS — E039 Hypothyroidism, unspecified: Secondary | ICD-10-CM | POA: Diagnosis present

## 2012-04-23 DIAGNOSIS — M1710 Unilateral primary osteoarthritis, unspecified knee: Principal | ICD-10-CM | POA: Diagnosis present

## 2012-04-23 DIAGNOSIS — I10 Essential (primary) hypertension: Secondary | ICD-10-CM | POA: Diagnosis present

## 2012-04-23 DIAGNOSIS — E119 Type 2 diabetes mellitus without complications: Secondary | ICD-10-CM | POA: Diagnosis present

## 2012-04-23 DIAGNOSIS — Z96659 Presence of unspecified artificial knee joint: Secondary | ICD-10-CM

## 2012-04-23 DIAGNOSIS — Z01812 Encounter for preprocedural laboratory examination: Secondary | ICD-10-CM

## 2012-04-23 DIAGNOSIS — J449 Chronic obstructive pulmonary disease, unspecified: Secondary | ICD-10-CM | POA: Diagnosis present

## 2012-04-23 DIAGNOSIS — J4489 Other specified chronic obstructive pulmonary disease: Secondary | ICD-10-CM | POA: Diagnosis present

## 2012-04-23 HISTORY — PX: KNEE ARTHROPLASTY: SHX992

## 2012-04-23 HISTORY — DX: Headache: R51

## 2012-04-23 HISTORY — PX: TOTAL KNEE ARTHROPLASTY: SHX125

## 2012-04-23 LAB — GLUCOSE, CAPILLARY
Glucose-Capillary: 121 mg/dL — ABNORMAL HIGH (ref 70–99)
Glucose-Capillary: 164 mg/dL — ABNORMAL HIGH (ref 70–99)
Glucose-Capillary: 150 mg/dL — ABNORMAL HIGH (ref 70–99)

## 2012-04-23 SURGERY — ARTHROPLASTY, KNEE, TOTAL
Anesthesia: General | Site: Knee | Laterality: Right | Wound class: Clean

## 2012-04-23 MED ORDER — FENTANYL CITRATE 0.05 MG/ML IJ SOLN
INTRAMUSCULAR | Status: AC
  Filled 2012-04-23: qty 2

## 2012-04-23 MED ORDER — PNEUMOCOCCAL VAC POLYVALENT 25 MCG/0.5ML IJ INJ
0.5000 mL | INJECTION | Freq: Once | INTRAMUSCULAR | Status: DC
  Filled 2012-04-23 (×2): qty 0.5

## 2012-04-23 MED ORDER — WARFARIN VIDEO: Freq: Once | Status: DC

## 2012-04-23 MED ORDER — WARFARIN - PHARMACIST DOSING INPATIENT: Freq: Every day | Status: DC

## 2012-04-23 MED ORDER — ONDANSETRON HCL 4 MG/2ML IJ SOLN: 4.0000 mg | Freq: Four times a day (QID) | INTRAMUSCULAR | Status: DC | PRN

## 2012-04-23 MED ORDER — ONDANSETRON HCL 4 MG PO TABS: 4.0000 mg | ORAL_TABLET | Freq: Four times a day (QID) | ORAL | Status: DC | PRN

## 2012-04-23 MED ORDER — LIDOCAINE HCL (CARDIAC) 20 MG/ML IV SOLN
INTRAVENOUS | Status: DC | PRN
  Administered 2012-04-23: 60 mg via INTRAVENOUS

## 2012-04-23 MED ORDER — MIDAZOLAM HCL 2 MG/2ML IJ SOLN
INTRAMUSCULAR | Status: AC
  Filled 2012-04-23: qty 2

## 2012-04-23 MED ORDER — FERROUS SULFATE 325 (65 FE) MG PO TABS
650.0000 mg | ORAL_TABLET | Freq: Every day | ORAL | Status: DC
  Administered 2012-04-24 – 2012-04-27 (×4): 650 mg via ORAL
  Filled 2012-04-23 (×5): qty 2

## 2012-04-23 MED ORDER — LACTATED RINGERS IV SOLN
INTRAVENOUS | Status: DC | PRN
  Administered 2012-04-23 (×2): via INTRAVENOUS

## 2012-04-23 MED ORDER — HYDROMORPHONE HCL PF 1 MG/ML IJ SOLN
INTRAMUSCULAR | Status: AC
  Filled 2012-04-23: qty 1

## 2012-04-23 MED ORDER — DOCUSATE SODIUM 100 MG PO CAPS
100.0000 mg | ORAL_CAPSULE | Freq: Two times a day (BID) | ORAL | Status: DC
  Administered 2012-04-23 – 2012-04-27 (×8): 100 mg via ORAL
  Filled 2012-04-23 (×11): qty 1

## 2012-04-23 MED ORDER — METHOCARBAMOL 500 MG PO TABS
ORAL_TABLET | ORAL | Status: AC
  Filled 2012-04-23: qty 1

## 2012-04-23 MED ORDER — POLYETHYLENE GLYCOL 3350 17 G PO PACK
17.0000 g | PACK | Freq: Every day | ORAL | Status: DC | PRN
  Administered 2012-04-25: 17 g via ORAL
  Filled 2012-04-23: qty 1

## 2012-04-23 MED ORDER — PROMETHAZINE HCL 25 MG/ML IJ SOLN: 6.2500 mg | INTRAMUSCULAR | Status: DC | PRN

## 2012-04-23 MED ORDER — ALBUTEROL SULFATE HFA 108 (90 BASE) MCG/ACT IN AERS
2.0000 | INHALATION_SPRAY | Freq: Four times a day (QID) | RESPIRATORY_TRACT | Status: DC | PRN
  Filled 2012-04-23: qty 6.7

## 2012-04-23 MED ORDER — ROCURONIUM BROMIDE 100 MG/10ML IV SOLN
INTRAVENOUS | Status: DC | PRN
  Administered 2012-04-23: 50 mg via INTRAVENOUS

## 2012-04-23 MED ORDER — METHOCARBAMOL 100 MG/ML IJ SOLN
500.0000 mg | Freq: Four times a day (QID) | INTRAVENOUS | Status: DC | PRN
  Filled 2012-04-23: qty 5

## 2012-04-23 MED ORDER — HYDROCHLOROTHIAZIDE 25 MG PO TABS
25.0000 mg | ORAL_TABLET | Freq: Every morning | ORAL | Status: DC
  Administered 2012-04-24 – 2012-04-27 (×4): 25 mg via ORAL
  Filled 2012-04-23 (×4): qty 1

## 2012-04-23 MED ORDER — WARFARIN SODIUM 7.5 MG PO TABS
7.5000 mg | ORAL_TABLET | Freq: Once | ORAL | Status: AC
  Administered 2012-04-23: 7.5 mg via ORAL
  Filled 2012-04-23: qty 1

## 2012-04-23 MED ORDER — ACETAMINOPHEN 10 MG/ML IV SOLN
INTRAVENOUS | Status: AC
  Filled 2012-04-23: qty 100

## 2012-04-23 MED ORDER — MEPERIDINE HCL 25 MG/ML IJ SOLN: 6.2500 mg | INTRAMUSCULAR | Status: DC | PRN

## 2012-04-23 MED ORDER — METOCLOPRAMIDE HCL 10 MG PO TABS: 5.0000 mg | ORAL_TABLET | Freq: Three times a day (TID) | ORAL | Status: DC | PRN

## 2012-04-23 MED ORDER — MIDAZOLAM HCL 5 MG/5ML IJ SOLN
INTRAMUSCULAR | Status: DC | PRN
  Administered 2012-04-23: 2 mg via INTRAVENOUS

## 2012-04-23 MED ORDER — OXYBUTYNIN CHLORIDE ER 5 MG PO TB24
5.0000 mg | ORAL_TABLET | Freq: Every day | ORAL | Status: DC
  Administered 2012-04-23 – 2012-04-27 (×5): 5 mg via ORAL
  Filled 2012-04-23 (×5): qty 1

## 2012-04-23 MED ORDER — HYDROMORPHONE HCL PF 1 MG/ML IJ SOLN
0.2500 mg | INTRAMUSCULAR | Status: DC | PRN
  Administered 2012-04-23: 1 mg via INTRAVENOUS
  Administered 2012-04-23 (×3): 0.5 mg via INTRAVENOUS

## 2012-04-23 MED ORDER — SODIUM CHLORIDE 0.9 % IR SOLN
Status: DC | PRN
  Administered 2012-04-23: 1000 mL
  Administered 2012-04-23: 3000 mL

## 2012-04-23 MED ORDER — ONDANSETRON HCL 4 MG/2ML IJ SOLN
INTRAMUSCULAR | Status: DC | PRN
  Administered 2012-04-23: 4 mg via INTRAVENOUS

## 2012-04-23 MED ORDER — ACETAMINOPHEN 10 MG/ML IV SOLN
INTRAVENOUS | Status: DC | PRN
  Administered 2012-04-23: 1000 mg via INTRAVENOUS

## 2012-04-23 MED ORDER — METOCLOPRAMIDE HCL 5 MG/ML IJ SOLN: 5.0000 mg | Freq: Three times a day (TID) | INTRAMUSCULAR | Status: DC | PRN

## 2012-04-23 MED ORDER — PROPOFOL 10 MG/ML IV EMUL
INTRAVENOUS | Status: DC | PRN
  Administered 2012-04-23: 200 mg via INTRAVENOUS

## 2012-04-23 MED ORDER — HYDROMORPHONE HCL PF 1 MG/ML IJ SOLN: 0.5000 mg | INTRAMUSCULAR | Status: DC | PRN

## 2012-04-23 MED ORDER — GLYCOPYRROLATE 0.2 MG/ML IJ SOLN
INTRAMUSCULAR | Status: DC | PRN
  Administered 2012-04-23: .6 mg via INTRAVENOUS

## 2012-04-23 MED ORDER — SODIUM CHLORIDE 0.9 % IV SOLN
INTRAVENOUS | Status: DC
  Administered 2012-04-23: 17:00:00 via INTRAVENOUS

## 2012-04-23 MED ORDER — BUPIVACAINE-EPINEPHRINE PF 0.5-1:200000 % IJ SOLN
INTRAMUSCULAR | Status: DC | PRN
  Administered 2012-04-23: 30 mL

## 2012-04-23 MED ORDER — HYDROCODONE-ACETAMINOPHEN 5-325 MG PO TABS
1.0000 | ORAL_TABLET | ORAL | Status: DC | PRN
  Administered 2012-04-25 – 2012-04-26 (×5): 2 via ORAL
  Administered 2012-04-26: 1 via ORAL
  Administered 2012-04-26 – 2012-04-27 (×5): 2 via ORAL
  Filled 2012-04-23: qty 2
  Filled 2012-04-23: qty 1
  Filled 2012-04-23 (×11): qty 2

## 2012-04-23 MED ORDER — COUMADIN BOOK
Freq: Once | Status: AC
  Administered 2012-04-23: 21:00:00
  Filled 2012-04-23: qty 1

## 2012-04-23 MED ORDER — HYDROMORPHONE HCL PF 1 MG/ML IJ SOLN
0.5000 mg | INTRAMUSCULAR | Status: DC | PRN
  Administered 2012-04-23 – 2012-04-25 (×11): 1 mg via INTRAVENOUS
  Filled 2012-04-23 (×10): qty 1

## 2012-04-23 MED ORDER — VITAMIN D3 25 MCG (1000 UNIT) PO TABS
1000.0000 [IU] | ORAL_TABLET | Freq: Every day | ORAL | Status: DC
  Administered 2012-04-23 – 2012-04-27 (×5): 1000 [IU] via ORAL
  Filled 2012-04-23 (×5): qty 1

## 2012-04-23 MED ORDER — AMLODIPINE BESYLATE 5 MG PO TABS
5.0000 mg | ORAL_TABLET | Freq: Every morning | ORAL | Status: DC
Start: 2012-04-24 — End: 2012-04-27
  Administered 2012-04-24 – 2012-04-27 (×4): 5 mg via ORAL
  Filled 2012-04-23 (×4): qty 1

## 2012-04-23 MED ORDER — NEOSTIGMINE METHYLSULFATE 1 MG/ML IJ SOLN
INTRAMUSCULAR | Status: DC | PRN
  Administered 2012-04-23: 4 mg via INTRAVENOUS

## 2012-04-23 MED ORDER — DEXAMETHASONE SODIUM PHOSPHATE 4 MG/ML IJ SOLN
INTRAMUSCULAR | Status: DC | PRN
  Administered 2012-04-23: 8 mg via INTRAVENOUS

## 2012-04-23 MED ORDER — MIDAZOLAM HCL 2 MG/2ML IJ SOLN
1.0000 mg | INTRAMUSCULAR | Status: DC | PRN
  Administered 2012-04-23: 1 mg via INTRAVENOUS
  Administered 2012-04-23: 2 mg via INTRAVENOUS

## 2012-04-23 MED ORDER — FENTANYL CITRATE 0.05 MG/ML IJ SOLN
INTRAMUSCULAR | Status: DC | PRN
  Administered 2012-04-23 (×2): 50 ug via INTRAVENOUS
  Administered 2012-04-23: 250 ug via INTRAVENOUS
  Administered 2012-04-23: 50 ug via INTRAVENOUS

## 2012-04-23 MED ORDER — FENTANYL CITRATE 0.05 MG/ML IJ SOLN
50.0000 ug | INTRAMUSCULAR | Status: DC | PRN
  Administered 2012-04-23: 100 ug via INTRAVENOUS

## 2012-04-23 MED ORDER — CEFAZOLIN SODIUM-DEXTROSE 2-3 GM-% IV SOLR
2.0000 g | Freq: Four times a day (QID) | INTRAVENOUS | Status: AC
  Administered 2012-04-23 – 2012-04-24 (×3): 2 g via INTRAVENOUS
  Filled 2012-04-23 (×3): qty 50

## 2012-04-23 MED ORDER — MIDAZOLAM HCL 2 MG/2ML IJ SOLN: 0.5000 mg | Freq: Once | INTRAMUSCULAR | Status: DC | PRN

## 2012-04-23 MED ORDER — MAGNESIUM CITRATE PO SOLN
1.0000 | Freq: Once | ORAL | Status: AC | PRN
  Filled 2012-04-23: qty 296

## 2012-04-23 MED ORDER — LEVOTHYROXINE SODIUM 75 MCG PO TABS
75.0000 ug | ORAL_TABLET | Freq: Every day | ORAL | Status: DC
  Administered 2012-04-24 – 2012-04-27 (×4): 75 ug via ORAL
  Filled 2012-04-23 (×5): qty 1

## 2012-04-23 MED ORDER — OXYCODONE-ACETAMINOPHEN 5-325 MG PO TABS
1.0000 | ORAL_TABLET | ORAL | Status: DC | PRN
  Administered 2012-04-23: 2 via ORAL
  Administered 2012-04-23: 1 via ORAL
  Administered 2012-04-24 – 2012-04-27 (×8): 2 via ORAL
  Filled 2012-04-23 (×9): qty 2

## 2012-04-23 MED ORDER — METHOCARBAMOL 500 MG PO TABS
500.0000 mg | ORAL_TABLET | Freq: Four times a day (QID) | ORAL | Status: DC | PRN
  Administered 2012-04-23 – 2012-04-27 (×11): 500 mg via ORAL
  Filled 2012-04-23 (×10): qty 1

## 2012-04-23 MED ORDER — LORATADINE 10 MG PO TABS
10.0000 mg | ORAL_TABLET | Freq: Every day | ORAL | Status: DC
  Administered 2012-04-23 – 2012-04-27 (×5): 10 mg via ORAL
  Filled 2012-04-23 (×5): qty 1

## 2012-04-23 MED ORDER — OXYCODONE-ACETAMINOPHEN 5-325 MG PO TABS
ORAL_TABLET | ORAL | Status: AC
  Filled 2012-04-23: qty 2

## 2012-04-23 MED ORDER — LACTATED RINGERS IV SOLN
INTRAVENOUS | Status: DC
  Administered 2012-04-23: 10:00:00 via INTRAVENOUS

## 2012-04-23 MED ORDER — BISACODYL 10 MG RE SUPP: 10.0000 mg | Freq: Every day | RECTAL | Status: DC | PRN

## 2012-04-23 SURGICAL SUPPLY — 52 items
BANDAGE GAUZE ELAST BULKY 4 IN (GAUZE/BANDAGES/DRESSINGS) ×2 IMPLANT
BLADE SAGITTAL 25.0X1.27X90 (BLADE) ×2 IMPLANT
BLADE SAW SAG 90X13X1.27 (BLADE) IMPLANT
BLADE SAW SGTL 13.0X1.19X90.0M (BLADE) ×2 IMPLANT
BLADE SURG 21 STRL SS (BLADE) ×2 IMPLANT
BNDG COHESIVE 6X5 TAN STRL LF (GAUZE/BANDAGES/DRESSINGS) ×2 IMPLANT
BONE CEMENT PALACOSE (Orthopedic Implant) ×4 IMPLANT
BOWL SMART MIX CTS (DISPOSABLE) ×2 IMPLANT
CEMENT BONE PALACOSE (Orthopedic Implant) ×2 IMPLANT
CLOTH BEACON ORANGE TIMEOUT ST (SAFETY) ×2 IMPLANT
COVER BACK TABLE 24X17X13 BIG (DRAPES) ×2 IMPLANT
COVER SURGICAL LIGHT HANDLE (MISCELLANEOUS) ×2 IMPLANT
CUFF TOURNIQUET SINGLE 34IN LL (TOURNIQUET CUFF) IMPLANT
CUFF TOURNIQUET SINGLE 44IN (TOURNIQUET CUFF) IMPLANT
DRAPE EXTREMITY T 121X128X90 (DRAPE) ×2 IMPLANT
DRAPE PROXIMA HALF (DRAPES) ×2 IMPLANT
DRAPE U-SHAPE 47X51 STRL (DRAPES) ×2 IMPLANT
DRSG ADAPTIC 3X8 NADH LF (GAUZE/BANDAGES/DRESSINGS) ×2 IMPLANT
DRSG PAD ABDOMINAL 8X10 ST (GAUZE/BANDAGES/DRESSINGS) ×2 IMPLANT
DURAPREP 26ML APPLICATOR (WOUND CARE) ×2 IMPLANT
ELECT REM PT RETURN 9FT ADLT (ELECTROSURGICAL) ×2
ELECTRODE REM PT RTRN 9FT ADLT (ELECTROSURGICAL) ×1 IMPLANT
FACESHIELD LNG OPTICON STERILE (SAFETY) ×6 IMPLANT
GLOVE BIOGEL PI IND STRL 9 (GLOVE) ×1 IMPLANT
GLOVE BIOGEL PI INDICATOR 9 (GLOVE) ×1
GLOVE NEODERM STER SZ 7 (GLOVE) ×4 IMPLANT
GLOVE SURG ORTHO 9.0 STRL STRW (GLOVE) ×6 IMPLANT
GOWN PREVENTION PLUS XLARGE (GOWN DISPOSABLE) ×2 IMPLANT
GOWN SRG XL XLNG 56XLVL 4 (GOWN DISPOSABLE) ×2 IMPLANT
GOWN STRL NON-REIN XL XLG LVL4 (GOWN DISPOSABLE) ×2
HANDPIECE INTERPULSE COAX TIP (DISPOSABLE) ×1
KIT BASIN OR (CUSTOM PROCEDURE TRAY) ×2 IMPLANT
KIT ROOM TURNOVER OR (KITS) ×2 IMPLANT
MANIFOLD NEPTUNE II (INSTRUMENTS) ×2 IMPLANT
NEEDLE SPNL 18GX3.5 QUINCKE PK (NEEDLE) IMPLANT
NS IRRIG 1000ML POUR BTL (IV SOLUTION) ×2 IMPLANT
PACK TOTAL JOINT (CUSTOM PROCEDURE TRAY) ×2 IMPLANT
PAD ARMBOARD 7.5X6 YLW CONV (MISCELLANEOUS) ×4 IMPLANT
PADDING CAST COTTON 6X4 STRL (CAST SUPPLIES) IMPLANT
SET HNDPC FAN SPRY TIP SCT (DISPOSABLE) ×1 IMPLANT
SPONGE GAUZE 4X4 12PLY (GAUZE/BANDAGES/DRESSINGS) ×2 IMPLANT
SPONGE LAP 18X18 X RAY DECT (DISPOSABLE) ×2 IMPLANT
STAPLER VISISTAT 35W (STAPLE) ×2 IMPLANT
SUCTION FRAZIER TIP 10 FR DISP (SUCTIONS) IMPLANT
SUT VIC AB 0 CTB1 27 (SUTURE) ×2 IMPLANT
SUT VIC AB 1 CTX 36 (SUTURE) ×2
SUT VIC AB 1 CTX36XBRD ANBCTR (SUTURE) ×2 IMPLANT
TOWEL OR 17X24 6PK STRL BLUE (TOWEL DISPOSABLE) ×2 IMPLANT
TOWEL OR 17X26 10 PK STRL BLUE (TOWEL DISPOSABLE) ×2 IMPLANT
TRAY FOLEY CATH 14FR (SET/KITS/TRAYS/PACK) ×2 IMPLANT
WATER STERILE IRR 1000ML POUR (IV SOLUTION) ×4 IMPLANT
WRAP KNEE MAXI GEL POST OP (GAUZE/BANDAGES/DRESSINGS) ×2 IMPLANT

## 2012-04-23 NOTE — H&P (Signed)
Marilyn [name] is an 41 y.o. female.   Chief Complaint: Osteoarthritis right knee HPI: Patient is a 41 year old woman with BMI greater than 40 who has had chronic osteoarthritis of her right knee. She has failed conservative care she has pain with activities of daily living and wishes to proceed at this time. Patient has used assistive devices anti-inflammatories injections activity modifications all without relief.  Past Medical History  Diagnosis Date  . Hypertension   . Arthritis   . Asthma   . Hypothyroidism     takes levothyroxen  . Anxiety   . Shortness of breath     with exertion  . Diabetes mellitus     borderline ... A1C was elevated.  . Anemia     taking iron now.     Past Surgical History  Procedure Date  . Cesarean section   . Cholecystectomy   . Knee arthroscopy   . Rotator cuff repair   . Joint replacement 2011    total left knee  . Tubal ligation 1996    No family history on file. Social History:  reports that she has quit smoking. She does not have any smokeless tobacco history on file. She reports that she does not drink alcohol or use illicit drugs.  Allergies:  Allergies  Allergen Reactions  . Lisinopril Anaphylaxis  . Bee Venom Swelling  . Codeine Other (See Comments)    Upset stomach  . Darvocet (Propoxyphene-Acetaminophen) Hives  . Tomato Rash    No prescriptions prior to admission    No results found for this or any previous visit (from the past 48 hour(s)). No results found.  Review of Systems  All other systems reviewed and are negative.    There were no vitals taken for this visit. Physical Exam   on examination patient has range of motion from 10-100. She has pain to palpation over the mediolateral joint line as well as the patellofemoral joint. Collateral cruciates are stable. Radiographs show tricompartmental arthritic changes. Assessment/Plan Assessment: Osteoarthritis right knee.  Plan: Will plan for total knee  arthroplasty. Risks and benefits were discussed including increased risks do to her elevated BMI which include infection neurovascular injury persistent pain failure of the implant DVT need for additional surgery. Patient states she understands and wishes to proceed at this time.  Lafe Clerk V 04/23/2012, 7:03 AM

## 2012-04-23 NOTE — Progress Notes (Deleted)
UR COMPLETED  

## 2012-04-23 NOTE — Transfer of Care (Signed)
Immediate Anesthesia Transfer of Care Note  Patient: Marilyn [name]  Procedure(s) Performed: Procedure(s) (LRB): TOTAL KNEE ARTHROPLASTY (Right)  Patient Location: PACU  Anesthesia Type: General  Level of Consciousness: awake, alert  and oriented  Airway & Oxygen Therapy: Patient Spontanous Breathing and Patient connected to nasal cannula oxygen  Post-op Assessment: Report given to PACU RN and Post -op Vital signs reviewed and stable  Post vital signs: Reviewed and stable  Complications: No apparent anesthesia complications

## 2012-04-23 NOTE — Preoperative (Signed)
Beta Blockers   Reason not to administer Beta Blockers:Not Applicable 

## 2012-04-23 NOTE — Progress Notes (Signed)
ANTICOAGULATION CONSULT NOTE - Initial Consult  Pharmacy Consult for Coumadin Indication: VTE prophylaxis  Allergies  Allergen Reactions  . Lisinopril Anaphylaxis  . Bee Venom Swelling  . Codeine Other (See Comments)    Upset stomach  . Darvocet (Propoxyphene-Acetaminophen) Hives  . Tomato Rash    Patient Measurements:   Weight ~ 140 kg  Vital Signs: Temp: 98.6 F (37 C) (08/07 1350) Temp src: Oral (08/07 0904) BP: 160/67 mmHg (08/07 1517) Pulse Rate: 77  (08/07 1526)  Labs: Baseline INR=1   Medical History: Past Medical History  Diagnosis Date  . Hypertension   . Arthritis   . Asthma   . Hypothyroidism     takes levothyroxen  . Anxiety   . Shortness of breath     with exertion  . Diabetes mellitus     borderline ... A1C was elevated.  . Anemia     taking iron now.     Medications:  Scheduled:    . amLODipine  5 mg Oral q morning - 10a  .  ceFAZolin (ANCEF) IV  3 g Intravenous 60 min Pre-Op  .  ceFAZolin (ANCEF) IV  2 g Intravenous Q6H  . cholecalciferol  1,000 Units Oral Daily  . docusate sodium  100 mg Oral BID  . ferrous sulfate  650 mg Oral Q breakfast  . hydrochlorothiazide  25 mg Oral q morning - 10a  . HYDROmorphone      . HYDROmorphone      . HYDROmorphone      . HYDROmorphone      . levothyroxine  75 mcg Oral Daily  . loratadine  10 mg Oral Daily  . methocarbamol      . midazolam      . oxybutynin  5 mg Oral Daily  . oxyCODONE-acetaminophen        Assessment: 41 yo F admitted 04/23/2012 for R TKA to start Coumadin for post-op VTE prophylaxis.  Baseline INR and CBC wnl.    Goal of Therapy:  INR 2-3 Monitor platelets by anticoagulation protocol: Yes   Plan:  1. Coumadin 7.5 mg PO x 1 tonight. 2. Daily INR. 3. Coumadin education materials ordered.  [employer], Pharm.D., BCPS Clinical Pharmacist Pager [phone] 04/23/2012 5:19 PM

## 2012-04-23 NOTE — Anesthesia Preprocedure Evaluation (Addendum)
Anesthesia Evaluation  Patient identified by MRN, date of birth, ID band Patient awake    Reviewed: Allergy & Precautions, H&P , NPO status , Patient's Chart, lab work & pertinent test results  History of Anesthesia Complications Negative for: history of anesthetic complications  Airway Mallampati: I TM Distance: >3 FB Neck ROM: Full    Dental No notable dental hx. (+) Teeth Intact and Dental Advisory Given   Pulmonary shortness of breath and with exertion, asthma , COPD COPD inhaler, former smoker,  breath sounds clear to auscultation  Pulmonary exam normal       Cardiovascular hypertension, Pt. 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) Morbid obesity  Renal/GU negative Renal ROS     Musculoskeletal   Abdominal (+) + obese,   Peds  Hematology negative hematology ROS (+)   Anesthesia Other Findings   Reproductive/Obstetrics ablation                          Anesthesia Physical Anesthesia Plan  ASA: III  Anesthesia Plan: General   Post-op Pain Management:    Induction: Intravenous  Airway Management Planned: Oral ETT  Additional Equipment:   Intra-op Plan:   Post-operative Plan: Extubation in OR  Informed Consent: I have reviewed the patients History and Physical, chart, labs and discussed the procedure including the risks, benefits and alternatives for the proposed anesthesia with the patient or authorized representative who has indicated his/her understanding and acceptance.   Dental advisory given  Plan Discussed with: CRNA and Surgeon  Anesthesia Plan Comments: (Plan routine monitors, GETA, with femoral nerve block for post op analgesia)        Anesthesia Quick Evaluation

## 2012-04-23 NOTE — Op Note (Signed)
OPERATIVE REPORT  DATE OF SURGERY: 04/23/2012  PATIENT:  Marilyn [name],  41 y.o. female  PRE-OPERATIVE DIAGNOSIS:  Osteoarthritis Right Knee  POST-OPERATIVE DIAGNOSIS:  Osteoarthritis Right Knee  PROCEDURE:  Procedure(s): TOTAL KNEE ARTHROPLASTY Zimmer components. Size 2 tibia with stem. Size C. femur. 17 mm rotating platform polyethylene. 29 mm patella. Extra time required do to BMI greater than 50.  SURGEON:  Surgeon(s): [name], MD  ANESTHESIA:   regional and general  EBL:  Minimal ML  SPECIMEN:  No Specimen  TOURNIQUET:  * No tourniquets in log *  PROCEDURE DETAILS: Patient is a 41 year old woman with BMI greater than 50 presents after failure conservative care for total knee arthroplasty on the right knee she is status post a total knee arthroplasty on the left. Patient complains of pain with activities daily living she is undergone arthroscopy anti-inflammatories assistive devices modification of her activities as well as injections all without relief. Due to failure conservative care patient presents at this time for surgical intervention. Risks and benefits of surgery were discussed including infection neurovascular injury persistent pain DVT fracture the bone need for additional surgery increased risk due to elevated BMI. Patient states she understands was pursued this time. Should procedure patient brought to or and underwent a general anesthetic after a popliteal block. After adequate levels of anesthesia were obtained patient's right lower extremity was prepped using DuraPrep draped into a sterile field an [place] was used to cover all exposed skin. A midline incision was made carried down to a medial parapatellar retinacular incision. Attention was first focused on the patella tendon millimeters was taken off the patella and the button cuts were made for a size 29 patella. Attention was then focused on the tibia and 10 mm was taken off the tibia neutral varus valgus  7 posterior slope this was then punched for the long stem. Attention was then focused on the femur and this was set for a size C. femur and knee box cuts and chamfer cuts were made for the femur. This was then trialed with different polyethylene drains in the 17 mm has stable varus and valgus in full extension. The knee was irrigated with pulsatile lavage meniscus and loose material was removed as well as bony spurs. The tibia was cemented in place followed by the femoral component cemented in place the poly-tray was inserted and the knee was left in extension until the cement hardened. The patella was clamped in place and left in place until the cement hardened. The wounds irrigated with pulsatile lavage both before and after placement of the components. A tourniquet was not used due to the elevated BMI. The knee was placed the range of motion and there is no subluxation of the patella. The retinaculum was closed using #1 Vicryl subcutaneous is closed using 0 Vicryl the skin was closed using staples the wound is covered with Adaptic orthopedic sponges web roll and Coban. Patient was extubated taken to the PACU in stable condition.  PLAN OF CARE: Admit to inpatient   PATIENT DISPOSITION:  PACU - hemodynamically stable.   [name], MD 04/23/2012 1:38 PM

## 2012-04-23 NOTE — Anesthesia Procedure Notes (Signed)
Anesthesia Regional Block:  Femoral nerve block  Pre-Anesthetic Checklist: ,, timeout performed, Correct Patient, Correct Site, Correct Laterality, Correct Procedure, Correct Position, site marked, Risks and benefits discussed,  Surgical consent,  Pre-op evaluation,  At surgeon's request and post-op pain management  Laterality: Right  Prep: chloraprep       Needles:  Injection technique: Single-shot  Needle Type: Stimulator Needle - 40     Needle Length: 4cm  Needle Gauge: 22 and 22 G    Additional Needles:  Procedures: nerve stimulator Femoral nerve block  Nerve Stimulator or Paresthesia:  Response: patella twitch, 0.48 mA, 0.1 ms,   Additional Responses:   Narrative:  Start time: 04/23/2012 10:32 AM End time: 04/23/2012 10:38 AM Injection made incrementally with aspirations every 5 mL.  Performed by: Personally  Anesthesiologist: Sandford Craze, MD  Additional Notes: Pt identified in Holding room.  Monitors applied. Working IV access confirmed. Sterile prep R groin.  #22ga PNS to patella twitch at 0.90mA threshold.  30cc 0.5% Bupivacaine with 1:200k epi injected incrementally after negative test dose.  Patient asymptomatic, VSS, no heme aspirated, tolerated well.   Sandford Craze, MD  Femoral nerve block

## 2012-04-23 NOTE — Anesthesia Postprocedure Evaluation (Signed)
  Anesthesia Post-op Note  Patient: Marilyn [name]  Procedure(s) Performed: Procedure(s) (LRB): TOTAL KNEE ARTHROPLASTY (Right)  Patient Location: PACU  Anesthesia Type: GA combined with regional for post-op pain  Level of Consciousness: awake, alert  and oriented  Airway and Oxygen Therapy: Patient Spontanous Breathing and Patient connected to nasal cannula oxygen  Post-op Pain: moderate  Post-op Assessment: Post-op Vital signs reviewed, Patient's Cardiovascular Status Stable, Respiratory Function Stable, Patent Airway, No signs of Nausea or vomiting and Pain level controlled  Post-op Vital Signs: Reviewed and stable  Complications: No apparent anesthesia complications

## 2012-04-24 ENCOUNTER — Encounter (HOSPITAL_COMMUNITY): Payer: Self-pay | Admitting: General Practice

## 2012-04-24 LAB — CBC
Hemoglobin: 8.2 g/dL — ABNORMAL LOW (ref 12.0–15.0)
MCH: 26.6 pg (ref 26.0–34.0)
MCHC: 32.2 g/dL (ref 30.0–36.0)
Platelets: 293 10*3/uL (ref 150–400)
RDW: 15.5 % (ref 11.5–15.5)

## 2012-04-24 LAB — BASIC METABOLIC PANEL
BUN: 10 mg/dL (ref 6–23)
Calcium: 8.8 mg/dL (ref 8.4–10.5)
GFR calc Af Amer: 82 mL/min — ABNORMAL LOW (ref >90)
GFR calc non Af Amer: 71 mL/min — ABNORMAL LOW (ref >90)
Glucose, Bld: 145 mg/dL — ABNORMAL HIGH (ref 70–99)
Potassium: 3.7 mEq/L (ref 3.5–5.1)

## 2012-04-24 LAB — PROTIME-INR: Prothrombin Time: 14.6 seconds (ref 11.6–15.2)

## 2012-04-24 LAB — GLUCOSE, CAPILLARY
Glucose-Capillary: 138 mg/dL — ABNORMAL HIGH (ref 70–99)
Glucose-Capillary: 120 mg/dL — ABNORMAL HIGH (ref 70–99)

## 2012-04-24 MED ORDER — PATIENT'S GUIDE TO USING COUMADIN BOOK
Freq: Once | Status: DC
  Filled 2012-04-24: qty 1

## 2012-04-24 MED ORDER — WARFARIN SODIUM 7.5 MG PO TABS
7.5000 mg | ORAL_TABLET | Freq: Once | ORAL | Status: AC
  Administered 2012-04-24: 7.5 mg via ORAL
  Filled 2012-04-24: qty 1

## 2012-04-24 MED ORDER — DIPHENHYDRAMINE HCL 25 MG PO CAPS
25.0000 mg | ORAL_CAPSULE | Freq: Four times a day (QID) | ORAL | Status: DC | PRN
  Administered 2012-04-24 – 2012-04-27 (×3): 25 mg via ORAL
  Filled 2012-04-24 (×5): qty 1

## 2012-04-24 NOTE — Progress Notes (Signed)
CARE MANAGEMENT NOTE 04/24/2012  Patient:  Marilyn [name], Marilyn [name]   Account Number:  [id number]  Date Initiated:  04/24/2012  Documentation initiated by:  Vance Peper  Subjective/Objective Assessment:   41 yr old female s/p right total knee arthroplasty.     Action/Plan:   CM spoke with patient regarding Home Health needs at discharge. Choice offered. Patient has rolling walker and 3in1.   Anticipated DC Date:  04/26/2012   Anticipated DC Plan:  HOME W HOME HEALTH SERVICES      DC Planning Services  CM consult      [facility] Choice  HOME HEALTH   Choice offered to / List presented to:  C-1 Patient        HH arranged  HH-2 PT      [facility] agency  [facility]   Status of service:  Completed, signed off Medicare Important Message given?   (If response is "NO", the following Medicare IM given date fields will be blank) Date Medicare IM given:   Date Additional Medicare IM given:    Discharge Disposition:  HOME W HOME HEALTH SERVICES  Per UR Regulation:    If discussed at Long Length of Stay Meetings, dates discussed:    Comments:

## 2012-04-24 NOTE — Progress Notes (Signed)
UR COMPLETED  

## 2012-04-24 NOTE — Evaluation (Signed)
Physical Therapy Evaluation Patient Details Name: Marilyn [name] MRN: [id number] DOB: October 11, 1970 Today's Date: 04/24/2012 Time: 4098-1191 PT Time Calculation (min): 25 min  PT Assessment / Plan / Recommendation Clinical Impression  Pt is a 41 y/o female, s/p R TKA.  Patient is pleasent and cooperative.  Pt demonstrates good knowledge of ther-ex.  Pt presents with deficits in functional mobility secondary to pain, decreased conditioning and weakness.  Patient will benefit from skilled physical therapy to improve function and maximize independence for discharge.    PT Assessment  Patient needs continued PT services    Follow Up Recommendations  Home health PT    Barriers to Discharge        Equipment Recommendations       Recommendations for Other Services     Frequency 7X/week    Precautions / Restrictions Precautions Precautions: Knee Restrictions Weight Bearing Restrictions: Yes RLE Weight Bearing: Weight bearing as tolerated   Pertinent Vitals/Pain 4/10 pre activity; 8/10 during ambulation; 6/10 post activity      Mobility  Bed Mobility Bed Mobility: Supine to Sit;Sitting - Scoot to Edge of Bed Supine to Sit: 4: Min guard Sitting - Scoot to [place] of Bed: 4: Min assist Details for Bed Mobility Assistance: Min assist for lowering RLE Transfers Transfers: Sit to Stand;Stand to Sit Sit to Stand: 4: Min guard;From bed Stand to Sit: 4: Min guard;With armrests;To chair/3-in-1 Details for Transfer Assistance: VCs for handplacement initially provided Ambulation/Gait Ambulation/Gait Assistance: 4: Min guard Ambulation Distance (Feet): 6 Feet Assistive device: 4-wheeled walker Gait Pattern: Step-to pattern;Decreased stride length;Antalgic;Trunk flexed;Wide base of support Gait velocity: decreased    Exercises Total Joint Exercises Ankle Circles/Pumps: AROM;10 reps;Both Quad Sets: AROM;Right;5 reps Heel Slides: AAROM;5 reps;Right   PT Diagnosis: Difficulty  walking;Abnormality of gait;Generalized weakness;Acute pain  PT Problem List: Decreased strength;Decreased range of motion;Decreased activity tolerance;Decreased mobility;Pain    PT Goals Acute Rehab PT Goals PT Goal Formulation: With patient Time For Goal Achievement: 05/01/12 Potential to Achieve Goals: Good Pt will go Sit to Stand: with modified independence PT Goal: Sit to Stand - Progress: Goal set today Pt will go Stand to Sit: with modified independence PT Goal: Stand to Sit - Progress: Goal set today Pt will Ambulate: 51 - 150 feet;with modified independence;with rolling walker PT Goal: Ambulate - Progress: Goal set today Pt will Go Up / Down Stairs: 3-5 stairs;with min assist;with rolling walker;with rail(s) PT Goal: Up/Down Stairs - Progress: Goal set today Pt will Perform Home Exercise Program: Independently PT Goal: Perform Home Exercise Program - Progress: Goal set today  Visit Information  Last PT Received On: 04/24/12 Assistance Needed: +2    Subjective Data  Subjective: I'm ready to get moving Patient Stated Goal: to go home with family assist   Prior Functioning  Home Living Lives With: Family Available Help at Discharge: Family Type of Home: House Home Access: Stairs to enter Secretary/administrator of Steps: 3 Entrance Stairs-Rails: Right Home Layout: One level Bathroom Shower/Tub: Engineer, manufacturing systems: Handicapped height Home Adaptive Equipment: Bedside commode/3-in-1;Raised toilet seat with rails;Tub transfer bench;Walker - rolling;Straight cane Additional Comments: Pt has had previous L TKA and has equipment from that procedure Prior Function Level of Independence: Independent Able to Take Stairs?: Yes Driving: Yes    Cognition  Overall Cognitive Status: Appears within functional limits for tasks assessed/performed Arousal/Alertness: Awake/alert Orientation Level: Appears intact for tasks assessed Behavior During Session: [facility] for tasks  performed    Extremity/Trunk Assessment Right Lower Extremity  Assessment RLE ROM/Strength/Tone: Deficits;Due to pain RLE ROM/Strength/Tone Deficits: 14-40 RLE Sensation: WFL - Light Touch;WFL - Proprioception RLE Coordination: WFL - gross motor Left Lower Extremity Assessment LLE ROM/Strength/Tone: WFL for tasks assessed (hx of Ltka) LLE Sensation: WFL - Light Touch;WFL - Proprioception LLE Coordination: WFL - gross motor      End of Session PT - End of Session Equipment Utilized During Treatment: Gait belt Activity Tolerance: Patient tolerated treatment well;Patient limited by fatigue;Patient limited by pain Patient left: in chair;with call bell/phone within reach;with family/visitor present Nurse Communication: Mobility status  GP     Fabio Asa 04/24/2012, 1:30 PM  Charlotte Crumb, PT DPT  [phone]

## 2012-04-24 NOTE — Progress Notes (Signed)
Physical Therapy Treatment Patient Details Name: Marilyn [name] MRN: [id number] DOB: Sep 05, 1971 Today's Date: 04/24/2012 Time: 7829-5621 PT Time Calculation (min): 28 min  PT Assessment / Plan / Recommendation Comments on Treatment Session  Pt is 41 yo s/p R TKA.  Patient is making steady progress towards PT goals at this time.  Pt will continue to benefit from skilled PT to improve functional mobility, ROM and strength.  Will continue to see to address HEP and stair negotiation.    Follow Up Recommendations  Home health PT    Barriers to Discharge        Equipment Recommendations       Recommendations for Other Services    Frequency 7X/week   Plan Discharge plan remains appropriate    Precautions / Restrictions Precautions Precautions: Knee Restrictions Weight Bearing Restrictions: Yes RLE Weight Bearing: Weight bearing as tolerated   Pertinent Vitals/Pain 3/10    Mobility  Bed Mobility Bed Mobility: Supine to Sit;Sitting - Scoot to Edge of Bed Supine to Sit: 4: Min guard Sitting - Scoot to [place] of Bed: 4: Min assist Details for Bed Mobility Assistance: Min assist for lowering RLE Transfers Transfers: Sit to Stand;Stand to Sit Sit to Stand: With armrests;From chair/3-in-1;4: Min assist;With upper extremity assist Stand to Sit: 4: Min guard;With armrests;To chair/3-in-1 Details for Transfer Assistance: VCs for handplacement initially provided Ambulation/Gait Ambulation/Gait Assistance: 4: Min guard Ambulation Distance (Feet): 48 Feet Assistive device: Rolling walker Gait Pattern: Step-to pattern;Decreased stride length;Antalgic;Trunk flexed;Wide base of support Gait velocity: decreased    Exercises Total Joint Exercises Ankle Circles/Pumps: AROM;10 reps;Both Quad Sets: AROM;Right;5 reps Heel Slides: AAROM;5 reps;Right   PT Diagnosis: Difficulty walking;Abnormality of gait;Generalized weakness;Acute pain  PT Problem List: Decreased strength;Decreased range of  motion;Decreased activity tolerance;Decreased mobility;Pain PT Treatment Interventions:     PT Goals Acute Rehab PT Goals PT Goal Formulation: With patient Time For Goal Achievement: 05/01/12 Potential to Achieve Goals: Good Pt will go Sit to Stand: with modified independence PT Goal: Sit to Stand - Progress: Progressing toward goal Pt will go Stand to Sit: with modified independence PT Goal: Stand to Sit - Progress: Progressing toward goal Pt will Ambulate: 51 - 150 feet;with modified independence;with rolling walker PT Goal: Ambulate - Progress: Progressing toward goal Pt will Go Up / Down Stairs: 3-5 stairs;with min assist;with rolling walker;with rail(s) PT Goal: Up/Down Stairs - Progress: Goal set today Pt will Perform Home Exercise Program: Independently PT Goal: Perform Home Exercise Program - Progress: Goal set today  Visit Information  Last PT Received On: 04/24/12 Assistance Needed: +2    Subjective Data  Subjective: I feel like the pain has gone down some this afternoon Patient Stated Goal: to go home   Cognition  Overall Cognitive Status: Appears within functional limits for tasks assessed/performed Arousal/Alertness: Awake/alert Orientation Level: Appears intact for tasks assessed Behavior During Session: [facility] for tasks performed    Balance     End of Session PT - End of Session Equipment Utilized During Treatment: Gait belt Activity Tolerance: Patient tolerated treatment well;Patient limited by fatigue;Patient limited by pain Patient left: in chair;with call bell/phone within reach;with family/visitor present Nurse Communication: Mobility status   GP     Fabio Asa 04/24/2012, 3:50 PM Charlotte Crumb, PT DPT  [phone]

## 2012-04-24 NOTE — Progress Notes (Signed)
Patient ID: Marilyn [name], female   DOB: 07-11-1971, 41 y.o.   MRN: [id number] Postoperative day one total knee arthroplasty. Plan to discontinue the Foley catheter this morning. Patient given instructions for knee extension exercises. Anticipate discharge to home on Monday. Lab work still pending this morning.

## 2012-04-25 ENCOUNTER — Encounter (HOSPITAL_COMMUNITY): Payer: Self-pay | Admitting: Orthopedic Surgery

## 2012-04-25 LAB — GLUCOSE, CAPILLARY
Glucose-Capillary: 118 mg/dL — ABNORMAL HIGH (ref 70–99)
Glucose-Capillary: 114 mg/dL — ABNORMAL HIGH (ref 70–99)

## 2012-04-25 LAB — CBC
HCT: 24.7 % — ABNORMAL LOW (ref 36.0–46.0)
Hemoglobin: 8.2 g/dL — ABNORMAL LOW (ref 12.0–15.0)
MCH: 27.4 pg (ref 26.0–34.0)
MCHC: 33.2 g/dL (ref 30.0–36.0)
MCV: 82.6 fL (ref 78.0–100.0)

## 2012-04-25 LAB — BASIC METABOLIC PANEL
BUN: 10 mg/dL (ref 6–23)
CO2: 30 mEq/L (ref 19–32)
Chloride: 95 mEq/L — ABNORMAL LOW (ref 96–112)
Glucose, Bld: 118 mg/dL — ABNORMAL HIGH (ref 70–99)
Potassium: 3.1 mEq/L — ABNORMAL LOW (ref 3.5–5.1)

## 2012-04-25 MED ORDER — PNEUMOCOCCAL VAC POLYVALENT 25 MCG/0.5ML IJ INJ
0.5000 mL | INJECTION | INTRAMUSCULAR | Status: AC
  Administered 2012-04-26: 0.5 mL via INTRAMUSCULAR
  Filled 2012-04-25: qty 0.5

## 2012-04-25 MED ORDER — WARFARIN SODIUM 1 MG PO TABS: 1.0000 mg | ORAL_TABLET | Freq: Every day | ORAL | Status: DC

## 2012-04-25 MED ORDER — WARFARIN SODIUM 7.5 MG PO TABS
7.5000 mg | ORAL_TABLET | Freq: Once | ORAL | Status: AC
  Administered 2012-04-25: 7.5 mg via ORAL
  Filled 2012-04-25: qty 1

## 2012-04-25 MED ORDER — HYDROCODONE-ACETAMINOPHEN 5-500 MG PO TABS: 1.0000 | ORAL_TABLET | Freq: Four times a day (QID) | ORAL | Status: AC | PRN

## 2012-04-25 MED ORDER — OXYCODONE-ACETAMINOPHEN 5-325 MG PO TABS: 1.0000 | ORAL_TABLET | ORAL | Status: DC | PRN

## 2012-04-25 NOTE — Progress Notes (Signed)
Patient ID: Marilyn [name], female   DOB: 01-Jul-1971, 41 y.o.   MRN: [id number] Postoperative day 2 right total knee arthroplasty. Last hemoglobin 8.2. Will discontinue her IV today. Anticipate discharge to home on Monday. Prescriptions on the chart for discharge.

## 2012-04-25 NOTE — Progress Notes (Signed)
ANTICOAGULATION CONSULT NOTE - Follow Up Consult  Pharmacy Consult for Coumadin Indication: VTE prophylaxis  Allergies  Allergen Reactions  . Lisinopril Anaphylaxis  . Bee Venom Swelling  . Codeine Other (See Comments)    Upset stomach  . Darvocet (Propoxyphene-Acetaminophen) Hives  . Tomato Rash   Labs:  [facility] 04/25/12 0815 04/24/12 0623  HGB 8.2* 8.2*  HCT 24.7* 25.5*  PLT 261 293  APTT -- --  LABPROT 16.3* 14.6  INR 1.29 1.12  HEPARINUNFRC -- --  CREATININE -- 0.97  CKTOTAL -- --  CKMB -- --  TROPONINI -- --    The CrCl is unknown because both a height and weight (above a minimum accepted value) are required for this calculation.  Assessment: S/p TKA No bleeding noted INR increasing as expected  Goal of Therapy:  INR 2-3 Monitor platelets by anticoagulation protocol: Yes   Plan:  1) Repeat Coumadin 7.5 mg po x 1 2) Follow up daily INR  Thank you. Okey Regal, PharmD [phone]  04/25/2012,9:04 AM

## 2012-04-25 NOTE — Progress Notes (Signed)
Chart reviewed and spoke with pt.  She has all DME from previous TKA, will have assist with LB ADLs until she achieves adequate knee flexion to perform independently.  She does not feel she needs OT services at this time.  Will sign off.  Thank you .  Jeani Hawking, OTR/L [phone]

## 2012-04-25 NOTE — Progress Notes (Signed)
Physical Therapy Treatment Patient Details Name: Marilyn [name] MRN: [id number] DOB: 1971-06-16 Today's Date: 04/25/2012 Time: 7829-5621 PT Time Calculation (min): 25 min  PT Assessment / Plan / Recommendation Comments on Treatment Session  Pt is making steady progress towards PT goals at this time.  Pt will benefit from continued skilled PT to adress stair negotiation with family training and HEP education.     Follow Up Recommendations  Home health PT    Barriers to Discharge        Equipment Recommendations       Recommendations for Other Services    Frequency 7X/week   Plan Discharge plan remains appropriate    Precautions / Restrictions Precautions Precautions: Knee Restrictions Weight Bearing Restrictions: Yes RLE Weight Bearing: Weight bearing as tolerated   Pertinent Vitals/Pain 4/10    Mobility  Transfers Transfers: Sit to Stand;Stand to Sit Sit to Stand: With armrests;From chair/3-in-1;With upper extremity assist;4: Min guard Stand to Sit: 4: Min guard;With armrests;To chair/3-in-1 Ambulation/Gait Ambulation/Gait Assistance: 5: Supervision Ambulation Distance (Feet): 155 Feet Assistive device: Rolling walker Ambulation/Gait Assistance Details: limited by fatigue Gait Pattern: Step-to pattern;Decreased stride length;Antalgic;Trunk flexed;Wide base of support Gait velocity: decreased          PT Goals Acute Rehab PT Goals PT Goal Formulation: With patient Time For Goal Achievement: 05/01/12 Potential to Achieve Goals: Good Pt will go Sit to Stand: with modified independence PT Goal: Sit to Stand - Progress: Progressing toward goal Pt will go Stand to Sit: with modified independence PT Goal: Stand to Sit - Progress: Progressing toward goal Pt will Ambulate: 51 - 150 feet;with modified independence;with rolling walker PT Goal: Ambulate - Progress: Progressing toward goal  Visit Information  Last PT Received On: 04/25/12 Assistance Needed: +2      Subjective Data  Subjective: I had a good night until about 3 am because i slept past my window for pain medicine Patient Stated Goal: to go home   Cognition  Overall Cognitive Status: Appears within functional limits for tasks assessed/performed Arousal/Alertness: Awake/alert Orientation Level: Appears intact for tasks assessed Behavior During Session: [facility] for tasks performed       End of Session PT - End of Session Equipment Utilized During Treatment: Gait belt Activity Tolerance: Patient tolerated treatment well;Patient limited by fatigue;Patient limited by pain Patient left: in chair;with call bell/phone within reach;with family/visitor present Nurse Communication: Mobility status   GP     Fabio Asa 04/25/2012, 10:59 AM Charlotte Crumb, PT DPT  [phone]

## 2012-04-26 LAB — BASIC METABOLIC PANEL
BUN: 7 mg/dL (ref 6–23)
Chloride: 95 mEq/L — ABNORMAL LOW (ref 96–112)
Creatinine, Ser: 0.81 mg/dL (ref 0.50–1.10)
GFR calc Af Amer: >90 mL/min (ref >90)

## 2012-04-26 LAB — GLUCOSE, CAPILLARY
Glucose-Capillary: 105 mg/dL — ABNORMAL HIGH (ref 70–99)
Glucose-Capillary: 92 mg/dL (ref 70–99)

## 2012-04-26 LAB — PROTIME-INR: Prothrombin Time: 21.8 seconds — ABNORMAL HIGH (ref 11.6–15.2)

## 2012-04-26 LAB — CBC
HCT: 22.2 % — ABNORMAL LOW (ref 36.0–46.0)
MCHC: 32.4 g/dL (ref 30.0–36.0)
MCV: 82.8 fL (ref 78.0–100.0)
RDW: 15.5 % (ref 11.5–15.5)

## 2012-04-26 MED ORDER — WARFARIN SODIUM 5 MG PO TABS
5.0000 mg | ORAL_TABLET | Freq: Once | ORAL | Status: AC
  Administered 2012-04-26: 5 mg via ORAL
  Filled 2012-04-26: qty 1

## 2012-04-26 MED ORDER — BISACODYL 10 MG RE SUPP
10.0000 mg | Freq: Once | RECTAL | Status: AC
  Administered 2012-04-26: 10 mg via RECTAL
  Filled 2012-04-26: qty 1

## 2012-04-26 NOTE — Progress Notes (Signed)
Subjective: 3 Days Post-Op Procedure(s) (LRB): TOTAL KNEE ARTHROPLASTY (Right) Patient reports pain as 6 on 0-10 scale.    Objective: Vital signs in last 24 hours: Temp:  [98.5 F (36.9 C)-99 F (37.2 C)] 99 F (37.2 C) (08/10 0549) Pulse Rate:  [83-97] 91  (08/10 0549) Resp:  [18-20] 18  (08/10 0549) BP: (131-150)/(57-74) 131/57 mmHg (08/10 0549) SpO2:  [100 %] 100 % (08/10 0549)  Intake/Output from previous day: 08/09 0701 - 08/10 0700 In: 1040 [P.O.:1040] Out: 3 [Urine:3] Intake/Output this shift:     Basename 04/25/12 0815 04/24/12 0623  HGB 8.2* 8.2*    Basename 04/25/12 0815 04/24/12 0623  WBC 11.6* 11.6*  RBC 2.99* 3.08*  HCT 24.7* 25.5*  PLT 261 293    Basename 04/25/12 0815 04/24/12 0623  NA 135 138  K 3.1* 3.7  CL 95* 100  CO2 30 27  BUN 10 10  CREATININE 0.95 0.97  GLUCOSE 118* 145*  CALCIUM 9.1 8.8    Basename 04/25/12 0815 04/24/12 0623  LABPT -- --  INR 1.29 1.12    Neurologically intact Compartment soft  Assessment/Plan: 3 Days Post-Op Procedure(s) (LRB): TOTAL KNEE ARTHROPLASTY (Right) Up with therapy  Chrishelle Zito C 04/26/2012, 8:03 AM

## 2012-04-26 NOTE — Progress Notes (Signed)
ANTICOAGULATION CONSULT NOTE - Follow Up Consult  Pharmacy Consult for Coumadin Indication: VTE prophylaxis  Allergies  Allergen Reactions  . Lisinopril Anaphylaxis  . Bee Venom Swelling  . Codeine Other (See Comments)    Upset stomach  . Darvocet (Propoxyphene-Acetaminophen) Hives  . Tomato Rash   Labs:  [facility] 04/26/12 0905 04/25/12 0815 04/24/12 0623  HGB 7.2* 8.2* --  HCT 22.2* 24.7* 25.5*  PLT 249 261 293  APTT -- -- --  LABPROT 21.8* 16.3* 14.6  INR 1.86* 1.29 1.12  HEPARINUNFRC -- -- --  CREATININE 0.81 0.95 0.97  CKTOTAL -- -- --  CKMB -- -- --  TROPONINI -- -- --    The CrCl is unknown because both a height and weight (above a minimum accepted value) are required for this calculation.  Assessment: S/p TKA No bleeding noted INR increasing as expected. INR today is 1.86  Goal of Therapy:  INR 2-3 Monitor platelets by anticoagulation protocol: Yes   Plan:  1) Coumadin 5mg  po x 1 2) Follow up daily INR 3) Monitor for signs of bleeding   Franchot Erichsen, Pharm.D. Clinical Pharmacist   Pager: [phone] Phone: [phone] 04/26/2012 11:01 AM

## 2012-04-26 NOTE — Progress Notes (Signed)
Physical Therapy Treatment Patient Details Name: Marilyn [name] MRN: [id number] DOB: 04/04/1971 Today's Date: 04/26/2012 Time: 4098-1191 PT Time Calculation (min): 25 min  PT Assessment / Plan / Recommendation Comments on Treatment Session  Continued to do well with stairs - son able to safely assist patient.  Slow progress with HEP.    Follow Up Recommendations  Home health PT    Barriers to Discharge        Equipment Recommendations  None recommended by PT    Recommendations for Other Services    Frequency 7X/week   Plan Discharge plan remains appropriate;Frequency remains appropriate    Precautions / Restrictions Precautions Precautions: Knee Restrictions Weight Bearing Restrictions: Yes RLE Weight Bearing: Weight bearing as tolerated       Mobility  Transfers Transfers: Sit to Stand;Stand to Sit Sit to Stand: 4: Min guard;With upper extremity assist;With armrests;From chair/3-in-1 Stand to Sit: 4: Min guard;With upper extremity assist;With armrests;To chair/3-in-1 Details for Transfer Assistance: Used proper hand placement.  Verbal cues for placement of RLE when sitting Ambulation/Gait Ambulation/Gait Assistance: 5: Supervision Ambulation Distance (Feet): 40 Feet Assistive device: Rolling walker Ambulation/Gait Assistance Details: Patient ambulated to and from bathroom.  Good technique. Gait Pattern: Step-to pattern;Decreased stride length;Antalgic;Trunk flexed;Wide base of support Gait velocity: decreased Stairs: Yes Stairs Assistance: 4: Min assist Stairs Assistance Details (indicate cue type and reason): Provided son with instruction on safe guarding technique for stairs.  Patient able to recall correct sequencing. Stair Management Technique: Backwards;With walker Number of Stairs: 3     Exercises Total Joint Exercises Ankle Circles/Pumps: AROM;Both;10 reps;Seated Quad Sets: AROM;Right;10 reps;Seated Short Arc Quad: AROM;Right;10 reps;Seated Heel Slides:  AROM;Right;10 reps;Seated   PT Diagnosis:    PT Problem List:   PT Treatment Interventions:     PT Goals Acute Rehab PT Goals PT Goal: Sit to Stand - Progress: Progressing toward goal PT Goal: Stand to Sit - Progress: Progressing toward goal PT Goal: Ambulate - Progress: Progressing toward goal PT Goal: Up/Down Stairs - Progress: Met PT Goal: Perform Home Exercise Program - Progress: Progressing toward goal  Visit Information  Last PT Received On: 04/26/12 Assistance Needed: +1    Subjective Data      Cognition  Overall Cognitive Status: Appears within functional limits for tasks assessed/performed Arousal/Alertness: Awake/alert Orientation Level: Appears intact for tasks assessed Behavior During Session: [facility] for tasks performed    Balance     End of Session PT - End of Session Equipment Utilized During Treatment: Gait belt Activity Tolerance: Patient limited by fatigue Patient left: in chair;with call bell/phone within reach;with family/visitor present Nurse Communication: Mobility status   GP     Vena Austria 04/26/2012, 5:01 PM Durenda Hurt. Renaldo Fiddler, [facility] Acute Rehab Services Pager [phone]

## 2012-04-26 NOTE — Progress Notes (Signed)
Pt dressing on right leg sliding off.  Pt requested the dressing to be changed. Incision was clean dry and intact with not drainage.  Staples were in place with no signs of infection.  ABD dressing and tape placed across incision.   Khrystyne Arpin N

## 2012-04-26 NOTE — Progress Notes (Signed)
Physical Therapy Treatment Patient Details Name: Marilyn [name] MRN: [id number] DOB: 06-17-1971 Today's Date: 04/26/2012 Time: 6578-4696 PT Time Calculation (min): 32 min  PT Assessment / Plan / Recommendation Comments on Treatment Session  Patient did well with stairs (daughter present for education).      Follow Up Recommendations  Home health PT    Barriers to Discharge        Equipment Recommendations  None recommended by PT    Recommendations for Other Services    Frequency 7X/week   Plan Discharge plan remains appropriate;Frequency remains appropriate    Precautions / Restrictions Precautions Precautions: Knee Restrictions Weight Bearing Restrictions: Yes RLE Weight Bearing: Weight bearing as tolerated       Mobility  Bed Mobility Bed Mobility: Supine to Sit;Sitting - Scoot to Edge of Bed Supine to Sit: 4: Min assist;HOB flat;With rails Sitting - Scoot to Edge of Bed: 4: Min guard;With rail Details for Bed Mobility Assistance:  (Min assist to move RLE off bed to sit) Transfers Transfers: Sit to Stand;Stand to Sit Sit to Stand: 4: Min guard;With upper extremity assist;From bed;With armrests;From chair/3-in-1 Stand to Sit: 4: Min guard;With upper extremity assist;With armrests;To chair/3-in-1 Details for Transfer Assistance: Verbal cues for hand placement and RLE placement during transfers Ambulation/Gait Ambulation/Gait Assistance: 5: Supervision Ambulation Distance (Feet): 82 Feet Assistive device: Rolling walker Ambulation/Gait Assistance Details: Cues to stand tall.  Limited by fatigue Gait Pattern: Step-to pattern;Decreased stride length;Antalgic;Trunk flexed;Wide base of support Gait velocity: decreased Stairs: Yes Stairs Assistance: 4: Min assist Stairs Assistance Details (indicate cue type and reason): Instructed patient on sequence for stairs backward.  Assist to steady RW Stair Management Technique: Backwards;With walker Number of Stairs: 3        PT Goals Acute Rehab PT Goals PT Goal: Sit to Stand - Progress: Progressing toward goal PT Goal: Stand to Sit - Progress: Progressing toward goal PT Goal: Ambulate - Progress: Progressing toward goal PT Goal: Up/Down Stairs - Progress: Met  Visit Information  Last PT Received On: 04/26/12 Assistance Needed: +1    Subjective Data  Subjective: I did the stairs backward with my last surgery.   Cognition  Overall Cognitive Status: Appears within functional limits for tasks assessed/performed Arousal/Alertness: Awake/alert Orientation Level: Appears intact for tasks assessed Behavior During Session: [facility] for tasks performed    Balance     End of Session PT - End of Session Equipment Utilized During Treatment: Gait belt Activity Tolerance: Patient limited by fatigue Patient left: in chair;with call bell/phone within reach;with family/visitor present Nurse Communication: Mobility status   GP     Vena Austria 04/26/2012, 9:48 AM Durenda Hurt. Renaldo Fiddler, [facility] Acute Rehab Services Pager [phone]

## 2012-04-27 LAB — PROTIME-INR: Prothrombin Time: 20.4 seconds — ABNORMAL HIGH (ref 11.6–15.2)

## 2012-04-27 LAB — GLUCOSE, CAPILLARY: Glucose-Capillary: 107 mg/dL — ABNORMAL HIGH (ref 70–99)

## 2012-04-27 MED ORDER — WARFARIN SODIUM 7.5 MG PO TABS
7.5000 mg | ORAL_TABLET | Freq: Once | ORAL | Status: AC
  Administered 2012-04-27: 7.5 mg via ORAL
  Filled 2012-04-27: qty 1

## 2012-04-27 NOTE — Progress Notes (Signed)
Physical Therapy Treatment Patient Details Name: LAQUITA HARLAN MRN: [id number] DOB: 04/30/1971 Today's Date: 04/27/2012 Time: 0830-0910 PT Time Calculation (min): 40 min  PT Assessment / Plan / Recommendation Comments on Treatment Session  Pt has met acute PT goals and is appropriate for discharge home when cleared by MD.    Follow Up Recommendations  Home health PT    Barriers to Discharge        Equipment Recommendations  None recommended by PT    Recommendations for Other Services    Frequency 7X/week   Plan Discharge plan remains appropriate;Frequency remains appropriate    Precautions / Restrictions Precautions Precautions: Knee Restrictions Weight Bearing Restrictions: Yes RLE Weight Bearing: Weight bearing as tolerated   Pertinent Vitals/Pain Pt reports pain in R knee 7/10 at rest.  Pt medicated approximately 1 hour prior to start of session. Repositioned R LE and applied Ice at completion of session.  Pt resting comfortably.     Mobility  Bed Mobility Bed Mobility: Supine to Sit;Sitting - Scoot to Edge of Bed Supine to Sit: 5: Supervision;HOB flat Sitting - Scoot to Edge of Bed: 7: Independent Details for Bed Mobility Assistance: Cues for technique to exit bed on contralateral side of surgery.   Transfers Transfers: Sit to Stand;Stand to Sit Sit to Stand: 6: Modified independent (Device/Increase time);From bed;From chair/3-in-1;With upper extremity assist (3 trials) Stand to Sit: 6: Modified independent (Device/Increase time);With upper extremity assist;To chair/3-in-1 Details for Transfer Assistance: Pt demonstrates proper technique Ambulation/Gait Ambulation/Gait Assistance: 5: Supervision Ambulation Distance (Feet): 130 Feet Assistive device: Rolling walker Ambulation/Gait Assistance Details: cues for decreased dependence on UEs Gait Pattern: Step-to pattern;Decreased stride length;Antalgic;Trunk flexed;Wide base of support Gait velocity:  decreased Stairs: Yes Stairs Assistance: 5: Supervision;6: Modified independent (Device/Increase time) (3 trials) Stairs Assistance Details (indicate cue type and reason): Instructed pt in ascending stairs forward with 1 rail only, forward with 1 rail and cane and backward with RW.  Pt preferred forward with 1 rail.   Stair Management Technique: One rail Right;Backwards;Forwards;With walker;With cane;Step to pattern Number of Stairs: 3  Wheelchair Mobility Wheelchair Mobility: No    Exercises Total Joint Exercises Ankle Circles/Pumps: AROM;Both;10 reps;Seated Quad Sets: AROM;Right;10 reps;Seated   PT Diagnosis:    PT Problem List:   PT Treatment Interventions:     PT Goals Acute Rehab PT Goals PT Goal Formulation: With patient Time For Goal Achievement: 05/01/12 Potential to Achieve Goals: Good Pt will go Sit to Stand: with modified independence PT Goal: Sit to Stand - Progress: Met Pt will go Stand to Sit: with modified independence PT Goal: Stand to Sit - Progress: Met Pt will Ambulate: 51 - 150 feet;with modified independence;with rolling walker PT Goal: Ambulate - Progress: Met Pt will Go Up / Down Stairs: 3-5 stairs;with min assist;with rolling walker;with rail(s) PT Goal: Up/Down Stairs - Progress: Met Pt will Perform Home Exercise Program: Independently PT Goal: Perform Home Exercise Program - Progress: Progressing toward goal  Visit Information  Last PT Received On: 04/27/12    Subjective Data  Subjective: I think I am doing pretty well Patient Stated Goal: to go home   Cognition  Overall Cognitive Status: Appears within functional limits for tasks assessed/performed Arousal/Alertness: Awake/alert Orientation Level: Appears intact for tasks assessed Behavior During Session: [facility] for tasks performed    Balance  Balance Balance Assessed: No  End of Session PT - End of Session Equipment Utilized During Treatment: Gait belt Activity Tolerance: Patient tolerated  treatment well Patient left: in  chair;with call bell/phone within reach;with family/visitor present Nurse Communication: Mobility status   GP     Tarryn Bogdan 04/27/2012, 9:20 AM Theron Arista L. Kambrey Hagger DPT [phone]

## 2012-04-27 NOTE — Discharge Summary (Signed)
Physician Discharge Summary  Patient ID: Marilyn [name] MRN: [id number] DOB/AGE: [birth year] 41 y.o.  Admit date: 04/23/2012 Discharge date: 04/27/2012  Admission Diagnoses: right knee OA   Discharge Diagnoses: right knee OA Active Problems:  * No active hospital problems. *    Discharged Condition: good  Hospital Course: underwent right TKA by Dr. Lajoyce Corners. Post op dvt prophylaxis, PT, OT. Stable post op course. Safely ambulating and ready for D/C home Sunday.   Consults: None  Significant Diagnostic Studies:   Treatments: surgery: right TKA  Discharge Exam: Blood pressure 126/55, pulse 92, temperature 98.7 F (37.1 C), temperature source Oral, resp. rate 18, SpO2 99.00%.   Disposition: 01-Home or Self Care   Medication List  As of 04/27/2012  3:58 PM   TAKE these medications         amLODipine 5 MG tablet   Commonly known as: NORVASC   Take 5 mg by mouth every morning.      cholecalciferol 1000 UNITS tablet   Commonly known as: VITAMIN D   Take 1,000 Units by mouth daily.      cyclobenzaprine 10 MG tablet   Commonly known as: FLEXERIL   Take 5-10 mg by mouth 3 (three) times daily as needed. For muscle spasms      ferrous sulfate 325 (65 FE) MG tablet   Take 650 mg by mouth daily with breakfast.      hydrochlorothiazide 25 MG tablet   Commonly known as: HYDRODIURIL   Take 25 mg by mouth every morning.      HYDROcodone-acetaminophen 5-500 MG per tablet   Commonly known as: VICODIN   Take 1 tablet by mouth every 6 (six) hours as needed for pain.      levothyroxine 75 MCG tablet   Commonly known as: SYNTHROID, LEVOTHROID   Take 75 mcg by mouth daily.      loratadine 10 MG tablet   Commonly known as: CLARITIN   Take 10 mg by mouth daily.      multivitamin tablet   Take 1 tablet by mouth daily.      oxybutynin 5 MG 24 hr tablet   Commonly known as: DITROPAN-XL   Take 5 mg by mouth daily.      oxyCODONE-acetaminophen 5-325 MG per tablet   Commonly known  as: PERCOCET/ROXICET   Take 1 tablet by mouth every 4 (four) hours as needed for pain.      polyethylene glycol packet   Commonly known as: MIRALAX / GLYCOLAX   Take 17 g by mouth daily as needed. For Constipation      PROVENTIL HFA IN   Inhale 2 puffs into the lungs daily as needed. For Asthma Symptoms      senna 8.6 MG Tabs   Commonly known as: SENOKOT   Take 1 tablet by mouth daily as needed. For constipation      warfarin 1 MG tablet   Commonly known as: COUMADIN   Take 1 tablet (1 mg total) by mouth daily.           Follow-up Information    Follow up with DUDA,MARCUS V, MD in 2 weeks.   Contact information:   [address] [place] [address] [phone]          Signed: Eldred Manges 04/27/2012, 3:58 PM

## 2012-04-27 NOTE — Progress Notes (Signed)
ANTICOAGULATION CONSULT NOTE - Follow Up Consult  Pharmacy Consult for Coumadin Indication: VTE prophylaxis  Allergies  Allergen Reactions  . Lisinopril Anaphylaxis  . Bee Venom Swelling  . Codeine Other (See Comments)    Upset stomach  . Darvocet (Propoxyphene-Acetaminophen) Hives  . Tomato Rash   Labs:  [facility] 04/27/12 0702 04/26/12 0905 04/25/12 0815  HGB -- 7.2* 8.2*  HCT -- 22.2* 24.7*  PLT -- 249 261  APTT -- -- --  LABPROT 20.4* 21.8* 16.3*  INR 1.71* 1.86* 1.29  HEPARINUNFRC -- -- --  CREATININE -- 0.81 0.95  CKTOTAL -- -- --  CKMB -- -- --  TROPONINI -- -- --    The CrCl is unknown because both a height and weight (above a minimum accepted value) are required for this calculation.  Assessment: S/p TKA now on subtherapeutic Coumadin  No bleeding noted INR down from 1.86 to 1.71 after a decrease in dose yesterday.   Goal of Therapy:  INR 2-3 Monitor platelets by anticoagulation protocol: Yes   Plan:  1) Coumadin 7.5mg  po x 1 2) Follow up daily INR 3) Monitor for signs of bleeding   Franchot Erichsen, Pharm.D. Clinical Pharmacist   Pager: [phone] Phone: [phone] 04/27/2012 12:20 PM

## 2012-04-28 LAB — GLUCOSE, CAPILLARY

## 2012-05-27 ENCOUNTER — Ambulatory Visit: Payer: Medicare Other | Attending: Orthopedic Surgery | Admitting: Physical Therapy

## 2012-05-27 DIAGNOSIS — R5381 Other malaise: Secondary | ICD-10-CM | POA: Insufficient documentation

## 2012-05-27 DIAGNOSIS — Z96659 Presence of unspecified artificial knee joint: Secondary | ICD-10-CM | POA: Insufficient documentation

## 2012-05-27 DIAGNOSIS — IMO0001 Reserved for inherently not codable concepts without codable children: Secondary | ICD-10-CM | POA: Insufficient documentation

## 2012-05-27 DIAGNOSIS — R262 Difficulty in walking, not elsewhere classified: Secondary | ICD-10-CM | POA: Insufficient documentation

## 2012-05-27 DIAGNOSIS — M25569 Pain in unspecified knee: Secondary | ICD-10-CM | POA: Insufficient documentation

## 2012-05-27 DIAGNOSIS — M25669 Stiffness of unspecified knee, not elsewhere classified: Secondary | ICD-10-CM | POA: Insufficient documentation

## 2012-05-28 ENCOUNTER — Other Ambulatory Visit: Payer: Self-pay | Admitting: Family Medicine

## 2012-05-28 DIAGNOSIS — M549 Dorsalgia, unspecified: Secondary | ICD-10-CM

## 2012-05-29 ENCOUNTER — Ambulatory Visit: Payer: Medicare Other | Admitting: Physical Therapy

## 2012-05-30 ENCOUNTER — Ambulatory Visit: Payer: Medicare Other | Admitting: Physical Therapy

## 2012-06-02 ENCOUNTER — Ambulatory Visit: Payer: Medicare Other | Admitting: *Deleted

## 2012-06-04 ENCOUNTER — Ambulatory Visit: Payer: Medicare Other | Admitting: Physical Therapy

## 2012-06-05 ENCOUNTER — Inpatient Hospital Stay
Admission: RE | Admit: 2012-06-05 | Discharge: 2012-06-05 | Payer: Medicare Other | Source: Ambulatory Visit | Attending: Family Medicine | Admitting: Family Medicine

## 2012-06-05 DIAGNOSIS — M5126 Other intervertebral disc displacement, lumbar region: Secondary | ICD-10-CM

## 2012-06-06 ENCOUNTER — Ambulatory Visit: Payer: Medicare Other | Admitting: Physical Therapy

## 2012-06-09 ENCOUNTER — Ambulatory Visit: Payer: Medicare Other | Admitting: Physical Therapy

## 2012-06-11 ENCOUNTER — Ambulatory Visit: Payer: Medicare Other | Admitting: Physical Therapy

## 2012-06-13 ENCOUNTER — Ambulatory Visit: Payer: Medicare Other | Admitting: Physical Therapy

## 2012-06-16 ENCOUNTER — Ambulatory Visit: Payer: Medicare Other | Attending: Orthopedic Surgery | Admitting: Physical Therapy

## 2012-06-16 DIAGNOSIS — R262 Difficulty in walking, not elsewhere classified: Secondary | ICD-10-CM | POA: Insufficient documentation

## 2012-06-16 DIAGNOSIS — IMO0001 Reserved for inherently not codable concepts without codable children: Secondary | ICD-10-CM | POA: Insufficient documentation

## 2012-06-16 DIAGNOSIS — M25569 Pain in unspecified knee: Secondary | ICD-10-CM | POA: Insufficient documentation

## 2012-06-16 DIAGNOSIS — M25669 Stiffness of unspecified knee, not elsewhere classified: Secondary | ICD-10-CM | POA: Insufficient documentation

## 2012-06-16 DIAGNOSIS — Z96659 Presence of unspecified artificial knee joint: Secondary | ICD-10-CM | POA: Insufficient documentation

## 2012-06-16 DIAGNOSIS — R5381 Other malaise: Secondary | ICD-10-CM | POA: Insufficient documentation

## 2012-06-18 ENCOUNTER — Ambulatory Visit: Payer: Medicare Other | Attending: Orthopedic Surgery | Admitting: Physical Therapy

## 2012-06-18 DIAGNOSIS — R5381 Other malaise: Secondary | ICD-10-CM | POA: Insufficient documentation

## 2012-06-18 DIAGNOSIS — IMO0001 Reserved for inherently not codable concepts without codable children: Secondary | ICD-10-CM | POA: Insufficient documentation

## 2012-06-18 DIAGNOSIS — R262 Difficulty in walking, not elsewhere classified: Secondary | ICD-10-CM | POA: Insufficient documentation

## 2012-06-18 DIAGNOSIS — Z96659 Presence of unspecified artificial knee joint: Secondary | ICD-10-CM | POA: Insufficient documentation

## 2012-06-18 DIAGNOSIS — M25569 Pain in unspecified knee: Secondary | ICD-10-CM | POA: Insufficient documentation

## 2012-06-18 DIAGNOSIS — M25669 Stiffness of unspecified knee, not elsewhere classified: Secondary | ICD-10-CM | POA: Insufficient documentation

## 2012-06-20 ENCOUNTER — Ambulatory Visit: Payer: Medicare Other | Admitting: Physical Therapy

## 2012-06-23 ENCOUNTER — Ambulatory Visit: Payer: Medicare Other | Admitting: Physical Therapy

## 2012-06-25 ENCOUNTER — Ambulatory Visit: Payer: Medicare Other | Admitting: Physical Therapy

## 2012-06-27 ENCOUNTER — Ambulatory Visit: Payer: Medicare Other | Admitting: Physical Therapy

## 2012-06-30 ENCOUNTER — Ambulatory Visit: Payer: Medicare Other | Admitting: Physical Therapy

## 2012-07-02 ENCOUNTER — Ambulatory Visit: Payer: Medicare Other | Admitting: Physical Therapy

## 2012-07-04 ENCOUNTER — Encounter: Payer: Medicare Other | Admitting: Physical Therapy

## 2012-07-18 ENCOUNTER — Encounter (HOSPITAL_COMMUNITY): Payer: Self-pay

## 2012-07-18 ENCOUNTER — Emergency Department (HOSPITAL_COMMUNITY)
Admission: EM | Admit: 2012-07-18 | Discharge: 2012-07-18 | Disposition: A | Discharge status: Home / Self Care | Payer: Medicare Other | Attending: Emergency Medicine | Admitting: Emergency Medicine

## 2012-07-18 DIAGNOSIS — Z87891 Personal history of nicotine dependence: Secondary | ICD-10-CM | POA: Insufficient documentation

## 2012-07-18 DIAGNOSIS — E039 Hypothyroidism, unspecified: Secondary | ICD-10-CM | POA: Insufficient documentation

## 2012-07-18 DIAGNOSIS — I10 Essential (primary) hypertension: Secondary | ICD-10-CM | POA: Insufficient documentation

## 2012-07-18 DIAGNOSIS — E876 Hypokalemia: Secondary | ICD-10-CM

## 2012-07-18 DIAGNOSIS — R7309 Other abnormal glucose: Secondary | ICD-10-CM | POA: Insufficient documentation

## 2012-07-18 DIAGNOSIS — Z79899 Other long term (current) drug therapy: Secondary | ICD-10-CM | POA: Insufficient documentation

## 2012-07-18 DIAGNOSIS — R5381 Other malaise: Secondary | ICD-10-CM | POA: Insufficient documentation

## 2012-07-18 DIAGNOSIS — N390 Urinary tract infection, site not specified: Secondary | ICD-10-CM | POA: Insufficient documentation

## 2012-07-18 DIAGNOSIS — Z91038 Other insect allergy status: Secondary | ICD-10-CM | POA: Insufficient documentation

## 2012-07-18 DIAGNOSIS — J45909 Unspecified asthma, uncomplicated: Secondary | ICD-10-CM | POA: Insufficient documentation

## 2012-07-18 DIAGNOSIS — R51 Headache: Secondary | ICD-10-CM

## 2012-07-18 DIAGNOSIS — G43109 Migraine with aura, not intractable, without status migrainosus: Secondary | ICD-10-CM | POA: Insufficient documentation

## 2012-07-18 DIAGNOSIS — D649 Anemia, unspecified: Secondary | ICD-10-CM | POA: Insufficient documentation

## 2012-07-18 DIAGNOSIS — F411 Generalized anxiety disorder: Secondary | ICD-10-CM | POA: Insufficient documentation

## 2012-07-18 DIAGNOSIS — Z888 Allergy status to other drugs, medicaments and biological substances status: Secondary | ICD-10-CM | POA: Insufficient documentation

## 2012-07-18 DIAGNOSIS — Z8701 Personal history of pneumonia (recurrent): Secondary | ICD-10-CM | POA: Insufficient documentation

## 2012-07-18 DIAGNOSIS — Z91018 Allergy to other foods: Secondary | ICD-10-CM | POA: Insufficient documentation

## 2012-07-18 LAB — URINALYSIS, ROUTINE W REFLEX MICROSCOPIC
Ketones, ur: NEGATIVE mg/dL
Nitrite: POSITIVE — AB
Protein, ur: 30 mg/dL — AB
Urobilinogen, UA: 0.2 mg/dL (ref 0.0–1.0)

## 2012-07-18 LAB — CBC WITH DIFFERENTIAL/PLATELET
Basophils Absolute: 0.1 10*3/uL (ref 0.0–0.1)
Eosinophils Absolute: 0.1 10*3/uL (ref 0.0–0.7)
HCT: 34.2 % — ABNORMAL LOW (ref 36.0–46.0)
Lymphocytes Relative: 22 % (ref 12–46)
Lymphs Abs: 1.1 10*3/uL (ref 0.7–4.0)
MCHC: 32.2 g/dL (ref 30.0–36.0)
MCV: 77 fL — ABNORMAL LOW (ref 78.0–100.0)
Monocytes Relative: 6 % (ref 3–12)
Neutro Abs: 3.5 10*3/uL (ref 1.7–7.7)
Platelets: 260 10*3/uL (ref 150–400)
RDW: 14.7 % (ref 11.5–15.5)
WBC: 5.1 10*3/uL (ref 4.0–10.5)

## 2012-07-18 LAB — BASIC METABOLIC PANEL
BUN: 16 mg/dL (ref 6–23)
Creatinine, Ser: 1.17 mg/dL — ABNORMAL HIGH (ref 0.50–1.10)
GFR calc Af Amer: 65 mL/min — ABNORMAL LOW (ref >90)
GFR calc non Af Amer: 56 mL/min — ABNORMAL LOW (ref >90)

## 2012-07-18 MED ORDER — NITROFURANTOIN MONOHYD MACRO 100 MG PO CAPS: 100.0000 mg | ORAL_CAPSULE | Freq: Two times a day (BID) | ORAL | Status: DC

## 2012-07-18 MED ORDER — METOCLOPRAMIDE HCL 5 MG/ML IJ SOLN
10.0000 mg | Freq: Once | INTRAMUSCULAR | Status: AC
  Administered 2012-07-18: 10 mg via INTRAVENOUS
  Filled 2012-07-18: qty 2

## 2012-07-18 MED ORDER — POTASSIUM CHLORIDE CRYS ER 20 MEQ PO TBCR
40.0000 meq | EXTENDED_RELEASE_TABLET | Freq: Once | ORAL | Status: AC
  Administered 2012-07-18: 40 meq via ORAL
  Filled 2012-07-18: qty 2

## 2012-07-18 MED ORDER — DIPHENHYDRAMINE HCL 50 MG/ML IJ SOLN
12.5000 mg | Freq: Once | INTRAMUSCULAR | Status: AC
  Administered 2012-07-18: 12.5 mg via INTRAVENOUS
  Filled 2012-07-18: qty 1

## 2012-07-18 MED ORDER — POTASSIUM CHLORIDE ER 10 MEQ PO TBCR: 10.0000 meq | EXTENDED_RELEASE_TABLET | Freq: Two times a day (BID) | ORAL | Status: DC

## 2012-07-18 MED ORDER — SODIUM CHLORIDE 0.9 % IV SOLN
Freq: Once | INTRAVENOUS | Status: AC
  Administered 2012-07-18: 12:00:00 via INTRAVENOUS

## 2012-07-18 NOTE — ED Notes (Signed)
Pt c/o nausea/vomiting and headache after starting new antibiotic. No vomiting since Wed

## 2012-07-18 NOTE — ED Provider Notes (Signed)
History     CSN: [id number]  Arrival date & time 07/18/12  1021   First MD Initiated Contact with Patient 07/18/12 1038      Chief Complaint  Patient presents with  . Nausea  . Headache    (Consider location/radiation/quality/duration/timing/severity/associated sxs/prior treatment) HPI Comments: Patient c/o nausea ,vomiting and frontal headache for several days.  States the symptoms began after she starting taking mobic for a "inflammed tendon" in her right knee.  She describes the headache as intermittent and throbbing.  She also c/o nausea and intermittent vomiting that resolved two days ago.  She denies abdominal pain, chest pain, shortness of breath,dizziness, fever, diarrhea or hematemesis  Patient is a 41 y.o. female presenting with headaches. The history is provided by the patient.  Headache  This is a new problem. The current episode started 2 days ago. Episode frequency: intermittently. The problem has not changed since onset.Associated with: recent anti-inflammatory. The pain is located in the frontal region. The quality of the pain is described as throbbing and dull. The pain is mild. The pain does not radiate. Associated symptoms include nausea and vomiting. Pertinent negatives include no fever, no malaise/fatigue, no near-syncope, no palpitations, no syncope and no shortness of breath. She has tried nothing for the symptoms. The treatment provided no relief.    Past Medical History  Diagnosis Date  . Hypertension   . Arthritis   . Asthma   . Hypothyroidism     takes levothyroxen  . Anxiety   . Shortness of breath     with exertion  . Anemia     taking iron now.   . Pneumonia     hx of PNA  . Diabetes mellitus     borderline ... A1C was elevated.  [name] Headache     HX OF MIGRAINES    Past Surgical History  Procedure Date  . Cesarean section   . Cholecystectomy   . Knee arthroscopy   . Rotator cuff repair   . Joint replacement 2011    total left knee  .  Tubal ligation 1996  . Knee arthroplasty 04/23/2012  . Total knee arthroplasty 04/23/2012    Procedure: TOTAL KNEE ARTHROPLASTY;  Surgeon: [name], MD;  Location: [facility] OR;  Service: Orthopedics;  Laterality: Right;  Right Total Knee Arthroplasty    No family history on file.  History  Substance Use Topics  . Smoking status: Former Smoker    Quit date: 04/24/1984  . Smokeless tobacco: Never Used  . Alcohol Use: No    OB History    Grav Para Term Preterm Abortions TAB SAB Ect Mult Living                  Review of Systems  Constitutional: Positive for appetite change and fatigue. Negative for fever, chills, malaise/fatigue and activity change.  HENT: Negative for neck pain and neck stiffness.   Respiratory: Negative for chest tightness and shortness of breath.   Cardiovascular: Negative for chest pain, palpitations, syncope and near-syncope.  Gastrointestinal: Positive for nausea and vomiting. Negative for abdominal pain and diarrhea.  Musculoskeletal: Negative for arthralgias.  Skin: Negative for rash.  Neurological: Positive for headaches. Negative for dizziness, seizures, syncope, facial asymmetry, speech difficulty, weakness and numbness.  Psychiatric/Behavioral: Negative for confusion and decreased concentration.  All other systems reviewed and are negative.    Allergies  Lisinopril; Bee venom; Codeine; Darvocet; and Tomato  Home Medications   Current Outpatient Rx  Name Route Sig  Dispense Refill  . PROVENTIL HFA IN Inhalation Inhale 2 puffs into the lungs daily as needed. For Asthma Symptoms    . AMLODIPINE BESYLATE 5 MG PO TABS Oral Take 5 mg by mouth every morning.     [name] VITAMIN D 1000 UNITS PO TABS Oral Take 1,000 Units by mouth daily.    [name] FERROUS SULFATE 325 (65 FE) MG PO TABS Oral Take 650 mg by mouth daily with breakfast.     . HYDROCHLOROTHIAZIDE 25 MG PO TABS Oral Take 25 mg by mouth every morning.     [name] LEVOTHYROXINE SODIUM 75 MCG PO TABS Oral Take 75  mcg by mouth daily.    [name] LORATADINE 10 MG PO TABS Oral Take 10 mg by mouth daily.      [name] ONE-DAILY MULTI VITAMINS PO TABS Oral Take 1 tablet by mouth daily.    . OXYBUTYNIN CHLORIDE ER 5 MG PO TB24 Oral Take 5 mg by mouth daily.    [name] POLYETHYLENE GLYCOL 3350 PO PACK Oral Take 17 g by mouth daily as needed. For Constipation    . SENNA 8.6 MG PO TABS Oral Take 1 tablet by mouth daily as needed. For constipation    . CYCLOBENZAPRINE HCL 10 MG PO TABS Oral Take 5-10 mg by mouth 3 (three) times daily as needed. For muscle spasms      BP 86/71  Pulse 182  Temp 98.2 F (36.8 C) (Oral)  Resp 20  Ht 5\' 3"  (1.6 m)  Wt 274 lb (124.286 kg)  BMI 48.54 kg/m2  SpO2 93%  Physical Exam  Nursing note and vitals reviewed. Constitutional: She is oriented to person, place, and time. She appears well-developed and well-nourished. No distress.  HENT:  Head: Normocephalic and atraumatic.  Mouth/Throat: Oropharynx is clear and moist.  Eyes: EOM are normal. Pupils are equal, round, and reactive to light.  Neck: Normal range of motion and phonation normal. Neck supple. No rigidity. No Brudzinski's sign and no Kernig's sign noted.  Cardiovascular: Normal rate, regular rhythm, normal heart sounds and intact distal pulses.   No murmur heard. Pulmonary/Chest: Effort normal and breath sounds normal. No respiratory distress.  Abdominal: Soft. She exhibits no distension and no mass. There is no tenderness. There is no rebound and no guarding.  Musculoskeletal: Normal range of motion.  Lymphadenopathy:    She has no cervical adenopathy.  Neurological: She is alert and oriented to person, place, and time. No cranial nerve deficit or sensory deficit. She exhibits normal muscle tone. Coordination and gait normal.  Reflex Scores:      Tricep reflexes are 2+ on the right side and 2+ on the left side.      Bicep reflexes are 2+ on the right side and 2+ on the left side. Skin: Skin is warm and dry.    ED Course    Procedures (including critical care time)  Labs Reviewed  CBC WITH DIFFERENTIAL - Abnormal; Notable for the following:    Hemoglobin 11.0 (*)     HCT 34.2 (*)     MCV 77.0 (*)     MCH 24.8 (*)     All other components within normal limits  BASIC METABOLIC PANEL - Abnormal; Notable for the following:    Sodium 132 (*)     Potassium 2.8 (*)     Chloride 89 (*)     Glucose, Bld 102 (*)     Creatinine, Ser 1.17 (*)     GFR calc non Af  Amer 56 (*)     GFR calc Af Amer 65 (*)     All other components within normal limits  URINALYSIS, ROUTINE W REFLEX MICROSCOPIC - Abnormal; Notable for the following:    Hgb urine dipstick SMALL (*)     Bilirubin Urine SMALL (*)     Protein, ur 30 (*)     Nitrite POSITIVE (*)     Leukocytes, UA TRACE (*)     All other components within normal limits  URINE MICROSCOPIC-ADD ON - Abnormal; Notable for the following:    Bacteria, UA MANY (*)     All other components within normal limits  POCT PREGNANCY, URINE  URINE CULTURE     1. Headache   2. Urinary tract infection   3. Hypokalemia     Urine culture is pending  MDM  Pt is feeling better, headache, nausea has resolved.  abd remains soft , NT.  Has received IVF's, reglan, benadryl and potassium.  Discussed lab findings with pt. Including hypokalemia and UTI.   Vitals stable,  Pt is non-toxic appearing.  No focal neuro deficits, no meningeal signs.  headache are intermittent, doubt emergent neurological process.   Patient agrees to close f/u with PMD or to return here if the symptoms worsen.    Pt agrees to close f/u with her PMD for recheck of her potassium.     Prescribed: macrobid potassium   Marilyn [name] L. [place], [place] 07/19/12 2051

## 2012-07-22 LAB — URINE CULTURE: Colony Count: >=100000

## 2012-07-23 NOTE — ED Notes (Signed)
+  Urine. Patient treated with Macrobid. Sensitive to same. Per protocol MD. °

## 2012-07-26 NOTE — ED Provider Notes (Signed)
Medical screening examination/treatment/procedure(s) were performed by non-physician practitioner and as supervising physician I was immediately available for consultation/collaboration. Devoria Albe, MD, Armando Gang   Ward Givens, MD 07/26/12 [phone]

## 2012-08-18 ENCOUNTER — Emergency Department (HOSPITAL_COMMUNITY): Payer: Medicare Other

## 2012-08-18 ENCOUNTER — Emergency Department (HOSPITAL_COMMUNITY)
Admission: EM | Admit: 2012-08-18 | Discharge: 2012-08-18 | Disposition: A | Discharge status: Home / Self Care | Payer: Medicare Other | Attending: Emergency Medicine | Admitting: Emergency Medicine

## 2012-08-18 ENCOUNTER — Encounter (HOSPITAL_COMMUNITY): Payer: Self-pay | Admitting: Emergency Medicine

## 2012-08-18 DIAGNOSIS — M25569 Pain in unspecified knee: Secondary | ICD-10-CM

## 2012-08-18 DIAGNOSIS — M25559 Pain in unspecified hip: Secondary | ICD-10-CM

## 2012-08-18 DIAGNOSIS — D649 Anemia, unspecified: Secondary | ICD-10-CM | POA: Insufficient documentation

## 2012-08-18 DIAGNOSIS — Y929 Unspecified place or not applicable: Secondary | ICD-10-CM | POA: Insufficient documentation

## 2012-08-18 DIAGNOSIS — Z9889 Other specified postprocedural states: Secondary | ICD-10-CM | POA: Insufficient documentation

## 2012-08-18 DIAGNOSIS — Y9389 Activity, other specified: Secondary | ICD-10-CM | POA: Insufficient documentation

## 2012-08-18 DIAGNOSIS — S79919A Unspecified injury of unspecified hip, initial encounter: Secondary | ICD-10-CM | POA: Insufficient documentation

## 2012-08-18 DIAGNOSIS — E119 Type 2 diabetes mellitus without complications: Secondary | ICD-10-CM | POA: Insufficient documentation

## 2012-08-18 DIAGNOSIS — S79929A Unspecified injury of unspecified thigh, initial encounter: Secondary | ICD-10-CM | POA: Insufficient documentation

## 2012-08-18 DIAGNOSIS — Z8701 Personal history of pneumonia (recurrent): Secondary | ICD-10-CM | POA: Insufficient documentation

## 2012-08-18 DIAGNOSIS — I10 Essential (primary) hypertension: Secondary | ICD-10-CM | POA: Insufficient documentation

## 2012-08-18 DIAGNOSIS — W19XXXA Unspecified fall, initial encounter: Secondary | ICD-10-CM

## 2012-08-18 DIAGNOSIS — W010XXA Fall on same level from slipping, tripping and stumbling without subsequent striking against object, initial encounter: Secondary | ICD-10-CM | POA: Insufficient documentation

## 2012-08-18 DIAGNOSIS — S8990XA Unspecified injury of unspecified lower leg, initial encounter: Secondary | ICD-10-CM | POA: Insufficient documentation

## 2012-08-18 DIAGNOSIS — F411 Generalized anxiety disorder: Secondary | ICD-10-CM | POA: Insufficient documentation

## 2012-08-18 DIAGNOSIS — Z79899 Other long term (current) drug therapy: Secondary | ICD-10-CM | POA: Insufficient documentation

## 2012-08-18 DIAGNOSIS — E039 Hypothyroidism, unspecified: Secondary | ICD-10-CM | POA: Insufficient documentation

## 2012-08-18 DIAGNOSIS — Z87891 Personal history of nicotine dependence: Secondary | ICD-10-CM | POA: Insufficient documentation

## 2012-08-18 DIAGNOSIS — M129 Arthropathy, unspecified: Secondary | ICD-10-CM | POA: Insufficient documentation

## 2012-08-18 MED ORDER — HYDROCODONE-ACETAMINOPHEN 5-500 MG PO TABS: 1.0000 | ORAL_TABLET | Freq: Four times a day (QID) | ORAL | Status: DC | PRN

## 2012-08-18 MED ORDER — METHOCARBAMOL 500 MG PO TABS: 500.0000 mg | ORAL_TABLET | Freq: Two times a day (BID) | ORAL | Status: DC

## 2012-08-18 NOTE — ED Provider Notes (Signed)
History     CSN: [id number]  Arrival date & time 08/18/12  1154   First MD Initiated Contact with Patient 08/18/12 1211      Chief Complaint  Patient presents with  . Fall  . Knee Pain  . Hip Pain    (Consider location/radiation/quality/duration/timing/severity/associated sxs/prior treatment) HPI  Patient presents to the ER by EMS after a fall. She is status post right knee replacement surgery in August 2013.  She was at a car wash when she went to turn and slipped in water causing her to fall on her right hip.  She denies hitting her head or injuring her neck. With 2 people to help she was able to stand and ambulate  with a limp. She is no longer having pain because she was given 2 mg of Dilaudid. nad vss  Past Medical History  Diagnosis Date  . Hypertension   . Arthritis   . Asthma   . Hypothyroidism     takes levothyroxen  . Anxiety   . Shortness of breath     with exertion  . Anemia     taking iron now.   . Pneumonia     hx of PNA  . Diabetes mellitus     borderline ... A1C was elevated.  [name] Headache     HX OF MIGRAINES    Past Surgical History  Procedure Date  . Cesarean section   . Cholecystectomy   . Knee arthroscopy   . Rotator cuff repair   . Joint replacement 2011    total left knee  . Tubal ligation 1996  . Knee arthroplasty 04/23/2012  . Total knee arthroplasty 04/23/2012    Procedure: TOTAL KNEE ARTHROPLASTY;  Surgeon: [name], MD;  Location: [facility] OR;  Service: Orthopedics;  Laterality: Right;  Right Total Knee Arthroplasty    No family history on file.  History  Substance Use Topics  . Smoking status: Former Smoker    Quit date: 04/24/1984  . Smokeless tobacco: Never Used  . Alcohol Use: No    OB History    Grav Para Term Preterm Abortions TAB SAB Ect Mult Living                  Review of Systems  Review of Systems  Gen: no weight loss, fevers, chills, night sweats  Lungs:No wheezing, coughing or hemoptysis CV: no chest  pain, palpitations, dependent edema or orthopnea  Abd: no abdominal pain, nausea, vomiting  GU: no dysuria or gross hematuria  MSK:  Right hip and knee pain Neuro: no headache, no focal neurologic deficits  Skin: no abnormalities Psyche: negative.   Allergies  Lisinopril; Bee venom; Codeine; Darvocet; Meloxicam; and Tomato  Home Medications   Current Outpatient Rx  Name  Route  Sig  Dispense  Refill  . PROVENTIL HFA IN   Inhalation   Inhale 2 puffs into the lungs daily as needed. For Asthma Symptoms         . AMLODIPINE BESYLATE 5 MG PO TABS   Oral   Take 5 mg by mouth every morning.          [name] VITAMIN D 1000 UNITS PO TABS   Oral   Take 1,000 Units by mouth daily.         . CYCLOBENZAPRINE HCL 10 MG PO TABS   Oral   Take 5-10 mg by mouth 3 (three) times daily as needed. For muscle spasms         .  FERROUS SULFATE 325 (65 FE) MG PO TABS   Oral   Take 650 mg by mouth daily with breakfast.          . HYDROCHLOROTHIAZIDE 25 MG PO TABS   Oral   Take 25 mg by mouth every morning.          [name] LEVOTHYROXINE SODIUM 75 MCG PO TABS   Oral   Take 75 mcg by mouth daily.         [name] LORATADINE 10 MG PO TABS   Oral   Take 10 mg by mouth daily.           [name] ONE-DAILY MULTI VITAMINS PO TABS   Oral   Take 1 tablet by mouth daily.         . OXYBUTYNIN CHLORIDE ER 5 MG PO TB24   Oral   Take 5 mg by mouth daily.         [name] POLYETHYLENE GLYCOL 3350 PO PACK   Oral   Take 17 g by mouth daily as needed. For Constipation         . SENNA 8.6 MG PO TABS   Oral   Take 1 tablet by mouth daily as needed. For constipation         . HYDROCODONE-ACETAMINOPHEN 5-500 MG PO TABS   Oral   Take 1 tablet by mouth every 6 (six) hours as needed for pain.   12 tablet   0   . METHOCARBAMOL 500 MG PO TABS   Oral   Take 1 tablet (500 mg total) by mouth 2 (two) times daily.   20 tablet   0     BP 156/109  Pulse 99  Temp 98.1 F (36.7 C) (Oral)  Resp 24  Ht 5\' 3"   (1.6 m)  Wt 286 lb (129.729 kg)  BMI 50.66 kg/m2  SpO2 99%  LMP 12/25/2009  Physical Exam  Nursing note and vitals reviewed. Constitutional: She appears well-developed and well-nourished. No distress.       Pt is morbidly obses  HENT:  Head: Normocephalic and atraumatic.  Eyes: Pupils are equal, round, and reactive to light.  Neck: Normal range of motion. Neck supple.  Cardiovascular: Normal rate and regular rhythm.   Pulmonary/Chest: Effort normal.  Abdominal: Soft.  Musculoskeletal:       Right hip: She exhibits decreased range of motion and tenderness. She exhibits normal strength, no bony tenderness, no swelling, no crepitus, no deformity and no laceration.       Right knee: She exhibits decreased range of motion (at baseline s/p knee replacement). tenderness (all over) found.       Legs: Neurological: She is alert.  Skin: Skin is warm and dry.    ED Course  Procedures (including critical care time)  Labs Reviewed - No data to display Dg Hip Complete Right  08/18/2012  *RADIOLOGY REPORT*  Clinical Data: Right hip pain post fall  RIGHT HIP - COMPLETE 2+ VIEW  Comparison: None.  Findings: 3 views of the right hip submitted.  No acute fracture or subluxation.  No radiopaque foreign body.  Pelvic phleboliths are noted.  IMPRESSION: No acute fracture or subluxation.   Original Report Authenticated By: Natasha Mead, M.D.    Dg Knee Complete 4 Views Right  08/18/2012  *RADIOLOGY REPORT*  Clinical Data: Fall, knee pain  RIGHT KNEE - COMPLETE 4+ VIEW  Comparison: 04/23/2012  Findings: Four views of the right knee submitted.  No acute fracture or subluxation.  The study is limited by patient large body habitus.  Again noted right knee prosthesis in anatomic alignment.  IMPRESSION: No acute fracture or subluxation.  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   Original Report Authenticated By: Natasha Mead, M.D.      1. Fall   2. Hip pain   3. Knee pain       MDM  xrays are normal.  People recently had knee replacement therefore has walker at home. She has two people she lives with who are available to help her ambulate should she have difficulty for a few days. Her Ortho doc is Dr. Lajoyce Corners. She is being referred abck to him for residual pain.   Pt has been advised of the symptoms that warrant their return to the ED. Patient has voiced understanding and has agreed to follow-up with the PCP or specialist.         Dorthula Matas, PA 08/18/12 1402

## 2012-08-18 NOTE — ED Notes (Signed)
Patient claims she was at a carwash and she slipped on water and fell on her R side.  Patient claims hard landing on R knee and R hip.   Patient described pain as 10/10 pain in both.

## 2012-08-19 NOTE — ED Provider Notes (Signed)
Medical screening examination/treatment/procedure(s) were performed by non-physician practitioner and as supervising physician I was immediately available for consultation/collaboration.   Dione Booze, MD 08/19/12 [phone]

## 2012-08-20 ENCOUNTER — Ambulatory Visit: Payer: Medicare Other | Admitting: Physical Therapy

## 2012-09-02 ENCOUNTER — Ambulatory Visit: Payer: Medicare Other | Admitting: Physical Therapy

## 2012-09-08 ENCOUNTER — Ambulatory Visit: Payer: Medicare Other | Admitting: Physical Therapy

## 2012-10-01 ENCOUNTER — Ambulatory Visit: Payer: Medicare Other | Attending: Orthopedic Surgery | Admitting: Physical Therapy

## 2012-10-01 DIAGNOSIS — R5381 Other malaise: Secondary | ICD-10-CM | POA: Insufficient documentation

## 2012-10-01 DIAGNOSIS — M25569 Pain in unspecified knee: Secondary | ICD-10-CM | POA: Insufficient documentation

## 2012-10-01 DIAGNOSIS — IMO0001 Reserved for inherently not codable concepts without codable children: Secondary | ICD-10-CM | POA: Insufficient documentation

## 2012-10-01 DIAGNOSIS — M25669 Stiffness of unspecified knee, not elsewhere classified: Secondary | ICD-10-CM | POA: Insufficient documentation

## 2012-10-02 ENCOUNTER — Ambulatory Visit: Payer: Medicare Other | Admitting: Physical Therapy

## 2012-10-06 ENCOUNTER — Ambulatory Visit: Payer: Medicare Other | Admitting: Physical Therapy

## 2012-10-07 ENCOUNTER — Ambulatory Visit: Payer: Medicare Other | Admitting: Physical Therapy

## 2012-10-09 ENCOUNTER — Ambulatory Visit: Payer: Medicare Other | Admitting: Physical Therapy

## 2012-10-13 ENCOUNTER — Ambulatory Visit: Payer: Medicare Other | Admitting: *Deleted

## 2012-10-14 ENCOUNTER — Encounter: Payer: Medicare Other | Admitting: Physical Therapy

## 2012-10-16 ENCOUNTER — Ambulatory Visit: Payer: Medicare Other | Admitting: Physical Therapy

## 2012-10-20 ENCOUNTER — Ambulatory Visit: Payer: Medicare Other | Attending: Orthopedic Surgery | Admitting: Physical Therapy

## 2012-10-20 DIAGNOSIS — M25569 Pain in unspecified knee: Secondary | ICD-10-CM | POA: Insufficient documentation

## 2012-10-20 DIAGNOSIS — IMO0001 Reserved for inherently not codable concepts without codable children: Secondary | ICD-10-CM | POA: Insufficient documentation

## 2012-10-20 DIAGNOSIS — R5381 Other malaise: Secondary | ICD-10-CM | POA: Insufficient documentation

## 2012-10-20 DIAGNOSIS — M25669 Stiffness of unspecified knee, not elsewhere classified: Secondary | ICD-10-CM | POA: Insufficient documentation

## 2012-10-21 ENCOUNTER — Ambulatory Visit: Payer: Medicare Other | Admitting: Physical Therapy

## 2012-10-23 ENCOUNTER — Ambulatory Visit: Payer: Medicare Other | Admitting: Physical Therapy

## 2012-10-23 ENCOUNTER — Other Ambulatory Visit (HOSPITAL_COMMUNITY): Payer: Self-pay | Admitting: Family Medicine

## 2012-10-23 DIAGNOSIS — Z139 Encounter for screening, unspecified: Secondary | ICD-10-CM

## 2012-10-27 ENCOUNTER — Ambulatory Visit: Payer: Medicare Other | Admitting: Physical Therapy

## 2012-10-28 ENCOUNTER — Ambulatory Visit: Payer: Medicare Other | Admitting: Physical Therapy

## 2012-10-30 ENCOUNTER — Encounter: Payer: Medicare Other | Admitting: Physical Therapy

## 2012-11-03 ENCOUNTER — Ambulatory Visit: Payer: Medicare Other | Admitting: *Deleted

## 2012-11-04 ENCOUNTER — Ambulatory Visit: Payer: Medicare Other | Admitting: *Deleted

## 2012-11-06 ENCOUNTER — Ambulatory Visit: Payer: Medicare Other | Admitting: Physical Therapy

## 2012-11-10 ENCOUNTER — Ambulatory Visit: Payer: Medicare Other | Admitting: Physical Therapy

## 2012-11-11 ENCOUNTER — Encounter: Payer: Medicare Other | Admitting: Physical Therapy

## 2012-11-13 ENCOUNTER — Ambulatory Visit: Payer: Medicare Other | Admitting: Physical Therapy

## 2012-11-17 ENCOUNTER — Encounter: Payer: Medicare Other | Admitting: Physical Therapy

## 2012-11-18 ENCOUNTER — Ambulatory Visit: Payer: Medicare Other | Attending: Orthopedic Surgery | Admitting: Physical Therapy

## 2012-11-18 DIAGNOSIS — R5381 Other malaise: Secondary | ICD-10-CM | POA: Insufficient documentation

## 2012-11-18 DIAGNOSIS — M25669 Stiffness of unspecified knee, not elsewhere classified: Secondary | ICD-10-CM | POA: Insufficient documentation

## 2012-11-18 DIAGNOSIS — M25569 Pain in unspecified knee: Secondary | ICD-10-CM | POA: Insufficient documentation

## 2012-11-18 DIAGNOSIS — IMO0001 Reserved for inherently not codable concepts without codable children: Secondary | ICD-10-CM | POA: Insufficient documentation

## 2012-11-20 ENCOUNTER — Ambulatory Visit: Payer: Medicare Other | Admitting: Physical Therapy

## 2012-11-24 ENCOUNTER — Ambulatory Visit: Payer: Medicare Other | Admitting: Physical Therapy

## 2012-11-25 ENCOUNTER — Ambulatory Visit (HOSPITAL_COMMUNITY)
Admission: RE | Admit: 2012-11-25 | Discharge: 2012-11-25 | Disposition: A | Discharge status: Home / Self Care | Payer: Medicare Other | Source: Ambulatory Visit | Attending: Family Medicine | Admitting: Family Medicine

## 2012-11-25 ENCOUNTER — Ambulatory Visit: Payer: Medicare Other | Admitting: Physical Therapy

## 2012-11-25 DIAGNOSIS — Z1231 Encounter for screening mammogram for malignant neoplasm of breast: Secondary | ICD-10-CM | POA: Insufficient documentation

## 2012-11-25 DIAGNOSIS — Z139 Encounter for screening, unspecified: Secondary | ICD-10-CM

## 2012-11-28 ENCOUNTER — Ambulatory Visit: Payer: Medicare Other | Admitting: Physical Therapy

## 2012-12-01 ENCOUNTER — Ambulatory Visit: Payer: Medicare Other | Admitting: Physical Therapy

## 2012-12-02 ENCOUNTER — Encounter: Payer: Medicare Other | Admitting: Physical Therapy

## 2012-12-04 ENCOUNTER — Ambulatory Visit: Payer: Medicare Other | Admitting: Physical Therapy

## 2012-12-08 ENCOUNTER — Encounter: Payer: Medicare Other | Admitting: *Deleted

## 2012-12-10 ENCOUNTER — Other Ambulatory Visit: Payer: Self-pay

## 2012-12-10 ENCOUNTER — Encounter (HOSPITAL_COMMUNITY): Payer: Self-pay | Admitting: *Deleted

## 2012-12-10 ENCOUNTER — Emergency Department (HOSPITAL_COMMUNITY)
Admission: EM | Admit: 2012-12-10 | Discharge: 2012-12-10 | Disposition: A | Discharge status: Home / Self Care | Payer: Medicare Other | Attending: Emergency Medicine | Admitting: Emergency Medicine

## 2012-12-10 ENCOUNTER — Emergency Department (HOSPITAL_COMMUNITY): Payer: Medicare Other

## 2012-12-10 DIAGNOSIS — I10 Essential (primary) hypertension: Secondary | ICD-10-CM | POA: Insufficient documentation

## 2012-12-10 DIAGNOSIS — E119 Type 2 diabetes mellitus without complications: Secondary | ICD-10-CM | POA: Insufficient documentation

## 2012-12-10 DIAGNOSIS — E669 Obesity, unspecified: Secondary | ICD-10-CM | POA: Insufficient documentation

## 2012-12-10 DIAGNOSIS — F411 Generalized anxiety disorder: Secondary | ICD-10-CM | POA: Insufficient documentation

## 2012-12-10 DIAGNOSIS — J45909 Unspecified asthma, uncomplicated: Secondary | ICD-10-CM | POA: Insufficient documentation

## 2012-12-10 DIAGNOSIS — Z8679 Personal history of other diseases of the circulatory system: Secondary | ICD-10-CM | POA: Insufficient documentation

## 2012-12-10 DIAGNOSIS — Z8701 Personal history of pneumonia (recurrent): Secondary | ICD-10-CM | POA: Insufficient documentation

## 2012-12-10 DIAGNOSIS — R079 Chest pain, unspecified: Secondary | ICD-10-CM

## 2012-12-10 DIAGNOSIS — Z8739 Personal history of other diseases of the musculoskeletal system and connective tissue: Secondary | ICD-10-CM | POA: Insufficient documentation

## 2012-12-10 DIAGNOSIS — E039 Hypothyroidism, unspecified: Secondary | ICD-10-CM | POA: Insufficient documentation

## 2012-12-10 DIAGNOSIS — D649 Anemia, unspecified: Secondary | ICD-10-CM | POA: Insufficient documentation

## 2012-12-10 DIAGNOSIS — Z87891 Personal history of nicotine dependence: Secondary | ICD-10-CM | POA: Insufficient documentation

## 2012-12-10 DIAGNOSIS — Z79899 Other long term (current) drug therapy: Secondary | ICD-10-CM | POA: Insufficient documentation

## 2012-12-10 LAB — COMPREHENSIVE METABOLIC PANEL
Albumin: 3.4 g/dL — ABNORMAL LOW (ref 3.5–5.2)
BUN: 14 mg/dL (ref 6–23)
Calcium: 9.3 mg/dL (ref 8.4–10.5)
Creatinine, Ser: 0.86 mg/dL (ref 0.50–1.10)
GFR calc Af Amer: >90 mL/min (ref >90)
Glucose, Bld: 96 mg/dL (ref 70–99)
Total Protein: 7.6 g/dL (ref 6.0–8.3)

## 2012-12-10 LAB — TROPONIN I: Troponin I: <0.3 ng/mL (ref <0.30)

## 2012-12-10 MED ORDER — ONDANSETRON HCL 4 MG/2ML IJ SOLN
4.0000 mg | Freq: Once | INTRAMUSCULAR | Status: AC
  Administered 2012-12-10: 4 mg via INTRAVENOUS
  Filled 2012-12-10: qty 2

## 2012-12-10 MED ORDER — KETOROLAC TROMETHAMINE 30 MG/ML IJ SOLN
30.0000 mg | Freq: Once | INTRAMUSCULAR | Status: AC
  Administered 2012-12-10: 30 mg via INTRAVENOUS
  Filled 2012-12-10: qty 1

## 2012-12-10 MED ORDER — CYCLOBENZAPRINE HCL 10 MG PO TABS: 10.0000 mg | ORAL_TABLET | Freq: Two times a day (BID) | ORAL | Status: DC | PRN

## 2012-12-10 MED ORDER — NAPROXEN 500 MG PO TABS: 500.0000 mg | ORAL_TABLET | Freq: Two times a day (BID) | ORAL | Status: DC

## 2012-12-10 NOTE — ED Provider Notes (Signed)
History     This chart was scribed for Donnetta Hutching, MD, MD by Smitty Pluck, ED Scribe. The patient was seen in room APA01/APA01 and the patient's care was started at 8:54 PM.   CSN: [id number]  Arrival date & time 12/10/12  1909      Chief Complaint  Patient presents with  . Chest Pain    The history is provided by the patient. No language interpreter was used.   Marilyn [name] is a 42 y.o. female hx of HTN and borderline DM who presents to the Emergency Department complaining of intermittent, stabbing, severe right chest pain onset 4 days ago. She states the episodes of pain lasted 1 minute. She reports that pain returned today 3 hours ago and has remained constant since. She states she has hx of asthma and usually has SOB. She has not taken any medication PTA. Pt denies diaphoresis, radiation of pain, numbness in extremities, fever, chills, nausea, vomiting, diarrhea, weakness in extremities, cough, SOB and any other pain. She denies hx of cardiac illnesses, MI and stroke. She denies smoking cigarettes. She denies family hx of cardiac illnesses. Hx of bilateral knee replacement.   PCP is Dr. Lysbeth Galas   Past Medical History  Diagnosis Date  . Hypertension   . Arthritis   . Asthma   . Hypothyroidism     takes levothyroxen  . Anxiety   . Shortness of breath     with exertion  . Anemia     taking iron now.   . Pneumonia     hx of PNA  . Diabetes mellitus     borderline ... A1C was elevated.  [name] Headache     HX OF MIGRAINES    Past Surgical History  Procedure Laterality Date  . Cesarean section    . Cholecystectomy    . Knee arthroscopy    . Rotator cuff repair    . Joint replacement  2011    total left knee  . Tubal ligation  1996  . Knee arthroplasty  04/23/2012  . Total knee arthroplasty  04/23/2012    Procedure: TOTAL KNEE ARTHROPLASTY;  Surgeon: [name], MD;  Location: [facility] OR;  Service: Orthopedics;  Laterality: Right;  Right Total Knee Arthroplasty    No  family history on file.  History  Substance Use Topics  . Smoking status: Former Smoker    Quit date: 04/24/1984  . Smokeless tobacco: Never Used  . Alcohol Use: No    OB History   Grav Para Term Preterm Abortions TAB SAB Ect Mult Living                  Review of Systems 10 Systems reviewed and all are negative for acute change except as noted in the HPI.   Allergies  Lisinopril; Bee venom; Codeine; Darvocet; Meloxicam; and Tomato  Home Medications   Current Outpatient Rx  Name  Route  Sig  Dispense  Refill  . albuterol (PROVENTIL HFA;VENTOLIN HFA) 108 (90 BASE) MCG/ACT inhaler   Inhalation   Inhale 2 puffs into the lungs every 6 (six) hours as needed for wheezing or shortness of breath.         [name] amLODipine (NORVASC) 5 MG tablet   Oral   Take 5 mg by mouth every morning.          . celecoxib (CELEBREX) 200 MG capsule   Oral   Take 200 mg by mouth 2 (two) times daily.         [name]  cholecalciferol (VITAMIN D) 1000 UNITS tablet   Oral   Take 1,000 Units by mouth daily.         . ferrous sulfate 325 (65 FE) MG tablet   Oral   Take 650 mg by mouth daily with breakfast.          . hydrochlorothiazide 25 MG tablet   Oral   Take 25 mg by mouth every morning.          [name] levothyroxine (SYNTHROID, LEVOTHROID) 75 MCG tablet   Oral   Take 75 mcg by mouth daily.         [name] loratadine (CLARITIN) 10 MG tablet   Oral   Take 10 mg by mouth daily.           . methocarbamol (ROBAXIN) 500 MG tablet   Oral   Take 1 tablet (500 mg total) by mouth 2 (two) times daily.   20 tablet   0   . Multiple Vitamin (MULTIVITAMIN) tablet   Oral   Take 1 tablet by mouth daily.         [name] oxybutynin (DITROPAN-XL) 5 MG 24 hr tablet   Oral   Take 5 mg by mouth daily.         . polyethylene glycol (MIRALAX / GLYCOLAX) packet   Oral   Take 17 g by mouth daily as needed. For Constipation         . senna (SENOKOT) 8.6 MG TABS   Oral   Take 1 tablet by mouth daily  as needed. For constipation           BP 161/85  Pulse 68  Temp(Src) 97.9 F (36.6 C) (Oral)  Resp 20  Ht 5\' 3"  (1.6 m)  Wt 295 lb (133.811 kg)  BMI 52.27 kg/m2  SpO2 100%  Physical Exam  Nursing note and vitals reviewed. Constitutional: She is oriented to person, place, and time. She appears well-developed and well-nourished.  obese  HENT:  Head: Normocephalic and atraumatic.  Eyes: Conjunctivae and EOM are normal. Pupils are equal, round, and reactive to light.  Neck: Normal range of motion. Neck supple.  Cardiovascular: Normal rate, regular rhythm and normal heart sounds.   Pulmonary/Chest: Effort normal and breath sounds normal.  Abdominal: Soft. Bowel sounds are normal.  Musculoskeletal: Normal range of motion.  Neurological: She is alert and oriented to person, place, and time.  Skin: Skin is warm and dry.  Psychiatric: She has a normal mood and affect.    ED Course  Procedures (including critical care time) DIAGNOSTIC STUDIES: Oxygen Saturation is 100% on room air, normal by my interpretation.    COORDINATION OF CARE: 8:57 PM Discussed ED treatment with pt and pt agrees.  9:00 PM Ordered:  Medications  ketorolac (TORADOL) 30 MG/ML injection 30 mg (not administered)  ondansetron (ZOFRAN) injection 4 mg (not administered)     Results for orders placed during the hospital encounter of 12/10/12  COMPREHENSIVE METABOLIC PANEL      Result Value Range   Sodium 133 (*) 135 - 145 mEq/L   Potassium 3.2 (*) 3.5 - 5.1 mEq/L   Chloride 99  96 - 112 mEq/L   CO2 22  19 - 32 mEq/L   Glucose, Bld 96  70 - 99 mg/dL   BUN 14  6 - 23 mg/dL   Creatinine, Ser 7.82  0.50 - 1.10 mg/dL   Calcium 9.3  8.4 - 95.6 mg/dL   Total Protein 7.6  6.0 -  8.3 g/dL   Albumin 3.4 (*) 3.5 - 5.2 g/dL   AST 14  0 - 37 U/L   ALT 12  0 - 35 U/L   Alkaline Phosphatase 70  39 - 117 U/L   Total Bilirubin 0.3  0.3 - 1.2 mg/dL   GFR calc non Af Amer 82 (*) >90 mL/min   GFR calc Af Amer >90   >90 mL/min  TROPONIN I      Result Value Range   Troponin I <0.30  <0.30 ng/mL   Dg Chest Portable 1 View  12/10/2012  *RADIOLOGY REPORT*  Clinical Data: Chest pain  PORTABLE CHEST - 1 VIEW  Comparison: 02/16/2012  Findings: Cardiomediastinal silhouette is within normal limits. The lungs are clear. No pleural effusion.  No pneumothorax.  No acute osseous abnormality.  IMPRESSION: Normal chest.   Original Report Authenticated By: Christiana Pellant, M.D.          No results found.   No diagnosis found.   Date: 12/10/2012  Rate: 66  Rhythm: normal sinus rhythm  QRS Axis: normal  Intervals: normal  ST/T Wave abnormalities: normal  Conduction Disutrbances: none  Narrative Interpretation: unremarkable     MDM  History, physical, risk factor profile is atypical for acute coronary syndrome or pulmonary emboli. Patient is in no acute distress. EKG and troponin negative.  Discharge meds Naprosyn 500 mg #20 and Flexeril 10 mg #20      I personally performed the services described in this documentation, which was scribed in my presence. The recorded information has been reviewed and is accurate.    Donnetta Hutching, MD 12/10/12 2158

## 2012-12-10 NOTE — ED Notes (Signed)
Pt reporting pain in right side of chest and shoulder.  Reports pain has occurred intermittently for several days.  Reports nausea earlier, but unrelated to time experienced to chest pain.  No vomiting or SOB.

## 2012-12-10 NOTE — ED Notes (Signed)
Performed EKG in Triage, pulled old one from MUSE and handed both to Dr Estell Harpin.

## 2012-12-11 ENCOUNTER — Encounter: Payer: Medicare Other | Admitting: Physical Therapy

## 2013-01-08 ENCOUNTER — Ambulatory Visit (HOSPITAL_BASED_OUTPATIENT_CLINIC_OR_DEPARTMENT_OTHER): Payer: Medicare Other | Attending: Otolaryngology

## 2013-01-08 VITALS — Ht 63.0 in | Wt 295.0 lb

## 2013-01-08 DIAGNOSIS — G4733 Obstructive sleep apnea (adult) (pediatric): Secondary | ICD-10-CM

## 2013-01-11 DIAGNOSIS — G4733 Obstructive sleep apnea (adult) (pediatric): Secondary | ICD-10-CM

## 2013-01-11 DIAGNOSIS — R0989 Other specified symptoms and signs involving the circulatory and respiratory systems: Secondary | ICD-10-CM

## 2013-01-11 DIAGNOSIS — R0609 Other forms of dyspnea: Secondary | ICD-10-CM

## 2013-01-12 NOTE — Procedures (Signed)
Marilyn [name], Marilyn [name]             ACCOUNT NO.:  [id number]  MEDICAL RECORD NO.:  [id number]          PATIENT TYPE:  OUT  LOCATION:  SLEEP CENTER                 FACILITY:  [facility]  PHYSICIAN:  Karlisa Gaubert D. Maple Hudson, MD, FCCP, FACPDATE OF BIRTH:  1968/[birth year]/09  DATE OF STUDY:  01/08/2013                           NOCTURNAL POLYSOMNOGRAM  REFERRING PHYSICIAN:  Jefry H. Pollyann Kennedy, MD  INDICATION FOR STUDY:  Hypersomnia with sleep apnea.  EPWORTH SLEEPINESS SCORE:  15/24, BMI 52.3, weight 295 pounds, height 63 inches, neck 15 inches.  MEDICATIONS:  Home medications are charted and reviewed.  SLEEP ARCHITECTURE:  Total sleep time 257.5 minutes with sleep efficiency 71.3%, stage I was 17.9%, stage II 69.5%, stage III absent, REM 12.6% of total sleep time.  Sleep latency 45 minutes, REM latency 173.5 minutes, awake after sleep onset 58.5 minutes.  Arousal index 11.2.  Bedtime medication:  None.  RESPIRATORY DATA:  Apnea-hypopnea index (AHI) 40.1 per hour.  A total of 172 events was scored including 3 obstructive apneas, 169 hypopneas. Events were seen in all sleep positions, especially supine, and in REM. REM AHI 44.3 per hour.  This study was ordered as a diagnostic NPSG protocol and CPAP titration was not done.  OXYGEN DATA:  Loud snoring with oxygen desaturation to a nadir of 87% and mean oxygen saturation through the study of 95.9% on room air.  CARDIAC DATA:  Sinus rhythm with occasional PAC and PVC.  MOVEMENT-PARASOMNIA:  No significant movement disturbance. Bathroom x1.  IMPRESSIONS-RECOMMENDATIONS: 1. Severe obstructive sleep apnea/hypopnea syndrome, AHI 40.1 per     hour, mainly hypopneas.  Loud snoring with oxygen desaturation to a     nadir of 87% and mean oxygen saturation through the study of 95.9%     on room air. 2. This study was ordered as a diagnostic NPSG protocol.  She can     return for dedicated CPAP titration study if appropriate.    [name] D. Maple Hudson, MD, [facility],  FACP Diplomate, American Board of Sleep Medicine   CDY/MEDQ  D:  01/11/2013 15:10:00  T:  01/12/2013 08:46:56  Job:  [id number]

## 2013-01-22 ENCOUNTER — Ambulatory Visit (HOSPITAL_BASED_OUTPATIENT_CLINIC_OR_DEPARTMENT_OTHER): Payer: Medicare Other | Attending: Otolaryngology

## 2013-01-22 VITALS — Ht 63.0 in | Wt 295.0 lb

## 2013-01-22 DIAGNOSIS — G4710 Hypersomnia, unspecified: Secondary | ICD-10-CM | POA: Insufficient documentation

## 2013-01-22 DIAGNOSIS — G4733 Obstructive sleep apnea (adult) (pediatric): Secondary | ICD-10-CM

## 2013-01-22 DIAGNOSIS — G4730 Sleep apnea, unspecified: Secondary | ICD-10-CM | POA: Insufficient documentation

## 2013-01-24 DIAGNOSIS — G4730 Sleep apnea, unspecified: Secondary | ICD-10-CM

## 2013-01-24 DIAGNOSIS — G4710 Hypersomnia, unspecified: Secondary | ICD-10-CM

## 2013-01-24 NOTE — Procedures (Signed)
NAMEJADI, Marilyn [name]             ACCOUNT NO.:  [id number]  MEDICAL RECORD NO.:  [id number]          PATIENT TYPE:  OUT  LOCATION:  SLEEP CENTER                 FACILITY:  [facility]  PHYSICIAN:  Halia Franey D. Maple Hudson, MD, FCCP, FACPDATE OF BIRTH:  [birth year]  DATE OF STUDY:  01/22/2013                           NOCTURNAL POLYSOMNOGRAM  REFERRING PHYSICIAN:  Jefry H. Pollyann Kennedy, MD  REFERRING PHYSICIAN:  Susy Frizzle, MD.  INDICATION FOR STUDY:  Hypersomnia with sleep apnea.  EPWORTH SLEEPINESS SCORE:  15/24.  BMI 52.3, weight 295 pounds.  Height 63 inches.  Neck 15 inches.  MEDICATIONS:  Home medications are charted and reviewed.  Baseline diagnostic NPSG on January 08, 2013 had recorded AHI 40.1 per hour with body weight 295 pounds.  CPAP titration is requested.  SLEEP ARCHITECTURE:  Total sleep time 301 minutes with sleep efficiency 80.1%.  Stage I was 14.1%, stage II was 77.1%, stage III was 0.5%, REM 8.3% of total sleep time.  Sleep latency 31.5 minutes, REM latency 109.5 minutes.  Awake after sleep onset 41.5 minutes.  Arousal index 9.4.  Bedtime medication:  None.  RESPIRATORY DATA:  CPAP titration protocol.  CPAP was titrated to 9 CWP, AHI 0 per hour.  She wore a small ResMed AirFit F10 full-face mask with heated humidifier.  OXYGEN DATA:  At final CPAP pressure, snoring was prevented and mean oxygen saturation held 97% on room air.  CARDIAC DATA:  Sinus rhythm with PVCs.  MOVEMENT/PARASOMNIA:  A total of 23 limb jerks were counted, of which 6 were associated with arousals or awakening for periodic limb movement with arousal and index of 1.2 per hour.  No bathroom trips.  IMPRESSION/RECOMMENDATION: 1. Successful CPAP titration to 9 CWP, AHI 0 per hour.  She wore a     small ResMed AirFit F10 full-face mask with heated humidifier.     Snoring was prevented and mean oxygen saturation held 97% on room     air. 2. Baseline diagnostic and PSG on January 08, 2013 had recorded AHI  of     40.1 per hour.  Body weight was 295 pounds.     Barrington Worley D. Maple Hudson, MD, [facility], FACP Diplomate, American Board of Sleep Medicine    CDY/MEDQ  D:  01/24/2013 14:09:19  T:  01/24/2013 19:43:50  Job:  [id number]

## 2013-04-03 ENCOUNTER — Encounter (HOSPITAL_COMMUNITY): Payer: Self-pay | Admitting: *Deleted

## 2013-04-03 ENCOUNTER — Emergency Department (HOSPITAL_COMMUNITY)
Admission: EM | Admit: 2013-04-03 | Discharge: 2013-04-03 | Disposition: A | Discharge status: Home / Self Care | Payer: Medicare Other | Attending: Emergency Medicine | Admitting: Emergency Medicine

## 2013-04-03 DIAGNOSIS — E119 Type 2 diabetes mellitus without complications: Secondary | ICD-10-CM | POA: Insufficient documentation

## 2013-04-03 DIAGNOSIS — Z791 Long term (current) use of non-steroidal anti-inflammatories (NSAID): Secondary | ICD-10-CM | POA: Insufficient documentation

## 2013-04-03 DIAGNOSIS — Z8701 Personal history of pneumonia (recurrent): Secondary | ICD-10-CM | POA: Insufficient documentation

## 2013-04-03 DIAGNOSIS — I10 Essential (primary) hypertension: Secondary | ICD-10-CM | POA: Insufficient documentation

## 2013-04-03 DIAGNOSIS — M25511 Pain in right shoulder: Secondary | ICD-10-CM

## 2013-04-03 DIAGNOSIS — Z87891 Personal history of nicotine dependence: Secondary | ICD-10-CM | POA: Insufficient documentation

## 2013-04-03 DIAGNOSIS — D649 Anemia, unspecified: Secondary | ICD-10-CM | POA: Insufficient documentation

## 2013-04-03 DIAGNOSIS — F411 Generalized anxiety disorder: Secondary | ICD-10-CM | POA: Insufficient documentation

## 2013-04-03 DIAGNOSIS — M549 Dorsalgia, unspecified: Secondary | ICD-10-CM | POA: Insufficient documentation

## 2013-04-03 DIAGNOSIS — M25519 Pain in unspecified shoulder: Secondary | ICD-10-CM | POA: Insufficient documentation

## 2013-04-03 DIAGNOSIS — Z8679 Personal history of other diseases of the circulatory system: Secondary | ICD-10-CM | POA: Insufficient documentation

## 2013-04-03 DIAGNOSIS — Z79899 Other long term (current) drug therapy: Secondary | ICD-10-CM | POA: Insufficient documentation

## 2013-04-03 DIAGNOSIS — G4733 Obstructive sleep apnea (adult) (pediatric): Secondary | ICD-10-CM | POA: Insufficient documentation

## 2013-04-03 DIAGNOSIS — J45901 Unspecified asthma with (acute) exacerbation: Secondary | ICD-10-CM | POA: Insufficient documentation

## 2013-04-03 DIAGNOSIS — E039 Hypothyroidism, unspecified: Secondary | ICD-10-CM | POA: Insufficient documentation

## 2013-04-03 DIAGNOSIS — M129 Arthropathy, unspecified: Secondary | ICD-10-CM | POA: Insufficient documentation

## 2013-04-03 DIAGNOSIS — Z9981 Dependence on supplemental oxygen: Secondary | ICD-10-CM | POA: Insufficient documentation

## 2013-04-03 HISTORY — DX: Obstructive sleep apnea (adult) (pediatric): G47.33

## 2013-04-03 HISTORY — DX: Dependence on other enabling machines and devices: Z99.89

## 2013-04-03 MED ORDER — HYDROCODONE-ACETAMINOPHEN 5-325 MG PO TABS
2.0000 | ORAL_TABLET | Freq: Once | ORAL | Status: AC
  Administered 2013-04-03: 2 via ORAL
  Filled 2013-04-03: qty 2

## 2013-04-03 MED ORDER — HYDROCODONE-ACETAMINOPHEN 7.5-325 MG PO TABS: 1.0000 | ORAL_TABLET | ORAL | Status: DC | PRN

## 2013-04-03 MED ORDER — IBUPROFEN 800 MG PO TABS
800.0000 mg | ORAL_TABLET | Freq: Once | ORAL | Status: AC
  Administered 2013-04-03: 800 mg via ORAL
  Filled 2013-04-03: qty 1

## 2013-04-03 MED ORDER — CELECOXIB 200 MG PO CAPS: 200.0000 mg | ORAL_CAPSULE | Freq: Two times a day (BID) | ORAL | Status: DC

## 2013-04-03 NOTE — ED Notes (Signed)
H Bryant, PA in with pt 

## 2013-04-03 NOTE — ED Provider Notes (Signed)
History    CSN: [id number] Arrival date & time 04/03/13  1431  First MD Initiated Contact with Patient 04/03/13 1618     Chief Complaint  Patient presents with  . Shoulder Pain   (Consider location/radiation/quality/duration/timing/severity/associated sxs/prior Treatment) Patient is a 42 y.o. female presenting with shoulder pain. The history is provided by the patient.  Shoulder Pain This is a new problem. The current episode started 1 to 4 weeks ago. The problem occurs daily. The problem has been gradually worsening. Associated symptoms include arthralgias. Pertinent negatives include no abdominal pain, chest pain, coughing, headaches, neck pain or numbness. Exacerbated by: moving the right shoulder. She has tried NSAIDs for the symptoms. The treatment provided no relief.   Past Medical History  Diagnosis Date  . Hypertension   . Arthritis   . Asthma   . Hypothyroidism     takes levothyroxen  . Anxiety   . Shortness of breath     with exertion  . Anemia     taking iron now.   . Pneumonia     hx of PNA  . Diabetes mellitus     borderline ... A1C was elevated.  [name] Headache(784.0)     HX OF MIGRAINES  . OSA on CPAP    Past Surgical History  Procedure Laterality Date  . Cesarean section    . Cholecystectomy    . Knee arthroscopy    . Rotator cuff repair    . Joint replacement  2011    total left knee  . Tubal ligation  1996  . Knee arthroplasty  04/23/2012  . Total knee arthroplasty  04/23/2012    Procedure: TOTAL KNEE ARTHROPLASTY;  Surgeon: [name], MD;  Location: [facility] OR;  Service: Orthopedics;  Laterality: Right;  Right Total Knee Arthroplasty   History reviewed. No pertinent family history. History  Substance Use Topics  . Smoking status: Former Smoker    Quit date: 04/24/1984  . Smokeless tobacco: Never Used  . Alcohol Use: No   OB History   Grav Para Term Preterm Abortions TAB SAB Ect Mult Living                 Review of Systems  Constitutional:  Negative for activity change.       All ROS Neg except as noted in HPI  HENT: Negative for nosebleeds and neck pain.   Eyes: Negative for photophobia and discharge.  Respiratory: Positive for wheezing. Negative for cough and shortness of breath.   Cardiovascular: Negative for chest pain and palpitations.  Gastrointestinal: Negative for abdominal pain and blood in stool.  Genitourinary: Negative for dysuria, frequency and hematuria.  Musculoskeletal: Positive for back pain and arthralgias.  Skin: Negative.   Neurological: Negative for dizziness, seizures, speech difficulty, numbness and headaches.  Psychiatric/Behavioral: Negative for hallucinations and confusion. The patient is nervous/anxious.     Allergies  Lisinopril; Bee venom; Codeine; Darvocet; Meloxicam; and Tomato  Home Medications   Current Outpatient Rx  Name  Route  Sig  Dispense  Refill  . albuterol (PROVENTIL HFA;VENTOLIN HFA) 108 (90 BASE) MCG/ACT inhaler   Inhalation   Inhale 2 puffs into the lungs every 6 (six) hours as needed for wheezing or shortness of breath.         [name] amLODipine (NORVASC) 5 MG tablet   Oral   Take 5 mg by mouth every morning.          . celecoxib (CELEBREX) 200 MG capsule   Oral  Take 200 mg by mouth 2 (two) times daily.         . cholecalciferol (VITAMIN D) 1000 UNITS tablet   Oral   Take 1,000 Units by mouth daily.         . cyclobenzaprine (FLEXERIL) 10 MG tablet   Oral   Take 1 tablet (10 mg total) by mouth 2 (two) times daily as needed for muscle spasms.   20 tablet   0   . ferrous sulfate 325 (65 FE) MG tablet   Oral   Take 650 mg by mouth daily with breakfast.          . hydrochlorothiazide 25 MG tablet   Oral   Take 25 mg by mouth every morning.          [name] levothyroxine (SYNTHROID, LEVOTHROID) 75 MCG tablet   Oral   Take 75 mcg by mouth daily.         [name] loratadine (CLARITIN) 10 MG tablet   Oral   Take 10 mg by mouth daily.           . Multiple  Vitamin (MULTIVITAMIN) tablet   Oral   Take 1 tablet by mouth daily.         [name] oxybutynin (DITROPAN-XL) 5 MG 24 hr tablet   Oral   Take 5 mg by mouth daily.         . polyethylene glycol (MIRALAX / GLYCOLAX) packet   Oral   Take 17 g by mouth daily as needed. For Constipation         . senna (SENOKOT) 8.6 MG TABS   Oral   Take 1 tablet by mouth daily as needed. For constipation          BP 155/74  Pulse 63  Temp(Src) 99.2 F (37.3 C) (Oral)  Resp 20  Ht 5\' 3"  (1.6 m)  Wt 309 lb (140.161 kg)  BMI 54.75 kg/m2  SpO2 99% Physical Exam  Nursing note and vitals reviewed. Constitutional: She is oriented to person, place, and time. She appears well-developed and well-nourished.  Non-toxic appearance.  Morbid obesity  HENT:  Head: Normocephalic.  Right Ear: Tympanic membrane and external ear normal.  Left Ear: Tympanic membrane and external ear normal.  Eyes: EOM and lids are normal. Pupils are equal, round, and reactive to light.  Neck: Normal range of motion. Neck supple. Carotid bruit is not present.  Cardiovascular: Normal rate, regular rhythm, normal heart sounds, intact distal pulses and normal pulses.   Pulmonary/Chest: Breath sounds normal. No respiratory distress.  Abdominal: Soft. Bowel sounds are normal. There is no tenderness. There is no guarding.  Musculoskeletal: Normal range of motion.  There is pain with attempted abduction and abduction of the right shoulder. There is pain to palpation of the anterior right shoulder. There is no dislocation. There no hot areas appreciated. The brachial and radial pulses are 2+. The capillary refill is less than 3 seconds.  Lymphadenopathy:       Head (right side): No submandibular adenopathy present.       Head (left side): No submandibular adenopathy present.    She has no cervical adenopathy.  Neurological: She is alert and oriented to person, place, and time. She has normal strength. No cranial nerve deficit or sensory  deficit.  No gross sensory or motor deficits appreciated.  Skin: Skin is warm and dry.  Psychiatric: She has a normal mood and affect. Her speech is normal.    ED Course  Procedures (including critical care time) Labs Reviewed - No data to display No results found. No diagnosis found.  MDM  I have reviewed nursing notes, vital signs, and all appropriate lab and imaging results for this patient. Patient states she has a history of rotator cuff injury on the left. She has had this repaired. She is now having similar symptoms on the right. She has pain with abduction and abduction. She is beginning to have at times some tingling involving the right upper extremity. It is of note that the patient was seen at another emergency department approximately 4-5 days ago with similar shoulder pain. X-ray of that time was negative for fracture or dislocation.  Examination is consistent with possible rotator cuff complications. There is no dislocation appreciated. The plan at this time is for the patient to be placed on Norco one or 2 tablets every 4 hours as needed for pain, and arm sling has been applied. Patient will be placed on Celebrex. Patient is to see Dr. Aldean Baker who performed her left shoulder operation.  Kathie Dike, PA-C 04/03/13 [address], [place] 04/03/13 1719

## 2013-04-03 NOTE — ED Notes (Signed)
Pain in R shoulder began 02/17/13.  Went to urgent care in [place] and [place] were benign.  Numbness in R arm.  She has had rotator cuff surgery on L shoulder and states this pain feels similar to pain prior to surgery.

## 2013-04-04 NOTE — ED Provider Notes (Signed)
Medical screening examination/treatment/procedure(s) were performed by non-physician practitioner and as supervising physician I was immediately available for consultation/collaboration.  Glenroy Crossen L Kalib Bhagat, MD 04/04/13 0111 

## 2013-04-14 ENCOUNTER — Ambulatory Visit: Payer: Medicare Other | Attending: Orthopedic Surgery | Admitting: Physical Therapy

## 2013-04-14 DIAGNOSIS — IMO0001 Reserved for inherently not codable concepts without codable children: Secondary | ICD-10-CM | POA: Insufficient documentation

## 2013-04-14 DIAGNOSIS — R5381 Other malaise: Secondary | ICD-10-CM | POA: Insufficient documentation

## 2013-04-14 DIAGNOSIS — M25519 Pain in unspecified shoulder: Secondary | ICD-10-CM | POA: Insufficient documentation

## 2013-04-17 ENCOUNTER — Ambulatory Visit: Payer: Medicare Other | Attending: Orthopedic Surgery | Admitting: Physical Therapy

## 2013-04-17 DIAGNOSIS — R5381 Other malaise: Secondary | ICD-10-CM | POA: Insufficient documentation

## 2013-04-17 DIAGNOSIS — M25519 Pain in unspecified shoulder: Secondary | ICD-10-CM | POA: Insufficient documentation

## 2013-04-17 DIAGNOSIS — IMO0001 Reserved for inherently not codable concepts without codable children: Secondary | ICD-10-CM | POA: Insufficient documentation

## 2013-04-21 ENCOUNTER — Ambulatory Visit: Payer: Medicare Other | Admitting: Physical Therapy

## 2013-04-23 ENCOUNTER — Ambulatory Visit: Payer: Medicare Other | Admitting: Physical Therapy

## 2013-04-28 ENCOUNTER — Encounter: Payer: Self-pay | Admitting: Obstetrics and Gynecology

## 2013-04-28 ENCOUNTER — Ambulatory Visit: Payer: Medicare Other | Admitting: Physical Therapy

## 2013-04-28 ENCOUNTER — Ambulatory Visit (INDEPENDENT_AMBULATORY_CARE_PROVIDER_SITE_OTHER): Payer: Medicare Other | Admitting: Obstetrics and Gynecology

## 2013-04-28 VITALS — BP 138/100 | Wt 308.2 lb

## 2013-04-28 DIAGNOSIS — E669 Obesity, unspecified: Secondary | ICD-10-CM

## 2013-04-28 DIAGNOSIS — Z9889 Other specified postprocedural states: Secondary | ICD-10-CM

## 2013-04-28 DIAGNOSIS — N926 Irregular menstruation, unspecified: Secondary | ICD-10-CM

## 2013-04-28 NOTE — Progress Notes (Signed)
Patient ID: Marilyn [name], female   DOB: [birth year], 42 y.o.   MRN: [id number] Pt here today to discuss PM symptoms after having endometrial ablation  Has questions re: duration of endo ablation NO period so far at 5 yrs.  Pt made aware of long range nature of endo ablation.  Weight issues discussed with options.  Plan 10% weight loss to prevent diabetes.

## 2013-04-30 ENCOUNTER — Encounter: Payer: Medicare Other | Admitting: Physical Therapy

## 2013-05-05 ENCOUNTER — Ambulatory Visit: Payer: Medicare Other | Admitting: Physical Therapy

## 2013-05-08 ENCOUNTER — Encounter: Payer: Medicare Other | Admitting: Physical Therapy

## 2013-05-11 ENCOUNTER — Other Ambulatory Visit: Payer: Self-pay | Admitting: Family Medicine

## 2013-05-11 DIAGNOSIS — M25511 Pain in right shoulder: Secondary | ICD-10-CM

## 2013-05-12 ENCOUNTER — Ambulatory Visit: Payer: Medicare Other | Admitting: Physical Therapy

## 2013-05-14 ENCOUNTER — Ambulatory Visit: Payer: Medicare Other | Admitting: Physical Therapy

## 2013-05-19 ENCOUNTER — Ambulatory Visit
Admission: RE | Admit: 2013-05-19 | Discharge: 2013-05-19 | Disposition: A | Discharge status: Home / Self Care | Payer: Medicare Other | Source: Ambulatory Visit | Attending: Family Medicine | Admitting: Family Medicine

## 2013-05-19 DIAGNOSIS — M25511 Pain in right shoulder: Secondary | ICD-10-CM

## 2013-05-29 ENCOUNTER — Ambulatory Visit: Payer: Medicare Other | Attending: Orthopedic Surgery

## 2013-05-29 DIAGNOSIS — IMO0001 Reserved for inherently not codable concepts without codable children: Secondary | ICD-10-CM | POA: Insufficient documentation

## 2013-05-29 DIAGNOSIS — R5381 Other malaise: Secondary | ICD-10-CM | POA: Insufficient documentation

## 2013-05-29 DIAGNOSIS — M25519 Pain in unspecified shoulder: Secondary | ICD-10-CM | POA: Insufficient documentation

## 2013-06-04 ENCOUNTER — Emergency Department (HOSPITAL_COMMUNITY)
Admission: EM | Admit: 2013-06-04 | Discharge: 2013-06-04 | Disposition: A | Discharge status: Home / Self Care | Payer: Medicare Other | Attending: Emergency Medicine | Admitting: Emergency Medicine

## 2013-06-04 ENCOUNTER — Encounter (HOSPITAL_COMMUNITY): Payer: Self-pay | Admitting: *Deleted

## 2013-06-04 ENCOUNTER — Emergency Department (HOSPITAL_COMMUNITY): Payer: Medicare Other

## 2013-06-04 DIAGNOSIS — J45909 Unspecified asthma, uncomplicated: Secondary | ICD-10-CM | POA: Insufficient documentation

## 2013-06-04 DIAGNOSIS — R52 Pain, unspecified: Secondary | ICD-10-CM | POA: Insufficient documentation

## 2013-06-04 DIAGNOSIS — M25552 Pain in left hip: Secondary | ICD-10-CM

## 2013-06-04 DIAGNOSIS — Z8659 Personal history of other mental and behavioral disorders: Secondary | ICD-10-CM | POA: Insufficient documentation

## 2013-06-04 DIAGNOSIS — D649 Anemia, unspecified: Secondary | ICD-10-CM | POA: Insufficient documentation

## 2013-06-04 DIAGNOSIS — Z79899 Other long term (current) drug therapy: Secondary | ICD-10-CM | POA: Insufficient documentation

## 2013-06-04 DIAGNOSIS — Z8701 Personal history of pneumonia (recurrent): Secondary | ICD-10-CM | POA: Insufficient documentation

## 2013-06-04 DIAGNOSIS — I10 Essential (primary) hypertension: Secondary | ICD-10-CM | POA: Insufficient documentation

## 2013-06-04 DIAGNOSIS — Z87891 Personal history of nicotine dependence: Secondary | ICD-10-CM | POA: Insufficient documentation

## 2013-06-04 DIAGNOSIS — M1612 Unilateral primary osteoarthritis, left hip: Secondary | ICD-10-CM

## 2013-06-04 DIAGNOSIS — G4730 Sleep apnea, unspecified: Secondary | ICD-10-CM | POA: Insufficient documentation

## 2013-06-04 DIAGNOSIS — M169 Osteoarthritis of hip, unspecified: Secondary | ICD-10-CM | POA: Insufficient documentation

## 2013-06-04 DIAGNOSIS — M1610 Unilateral primary osteoarthritis, unspecified hip: Secondary | ICD-10-CM | POA: Insufficient documentation

## 2013-06-04 DIAGNOSIS — E039 Hypothyroidism, unspecified: Secondary | ICD-10-CM | POA: Insufficient documentation

## 2013-06-04 DIAGNOSIS — Z9889 Other specified postprocedural states: Secondary | ICD-10-CM | POA: Insufficient documentation

## 2013-06-04 DIAGNOSIS — M25559 Pain in unspecified hip: Secondary | ICD-10-CM | POA: Insufficient documentation

## 2013-06-04 MED ORDER — OXYCODONE-ACETAMINOPHEN 5-325 MG PO TABS: 1.0000 | ORAL_TABLET | ORAL | Status: DC | PRN

## 2013-06-04 MED ORDER — OXYCODONE-ACETAMINOPHEN 5-325 MG PO TABS
1.0000 | ORAL_TABLET | Freq: Once | ORAL | Status: AC
  Administered 2013-06-04: 1 via ORAL
  Filled 2013-06-04: qty 1

## 2013-06-04 NOTE — ED Notes (Signed)
Pt states left hip pain x 2-3 weeks. States pain has gradually gotten worse. States it first began while getting up from a bent position while cleaning her bath tub. Seen PMD for same and was given muscle relaxers which did not help with the pain.

## 2013-06-04 NOTE — ED Provider Notes (Signed)
CSN: [id number]     Arrival date & time 06/04/13  1510 History   First MD Initiated Contact with Patient 06/04/13 1605     Chief Complaint  Patient presents with  . Hip Pain   (Consider location/radiation/quality/duration/timing/severity/associated sxs/prior Treatment) HPI Comments: Marilyn [name] is a 42 y.o. Female presenting with a now 2-3 week history of left lateral hip pain which has been intermittent but worse when she attempted to go up a flight of steps yesterday.  She originally develop sudden onset of pain while bending over her bathtub several weeks ago.  She has been seen by her pcp for this and started on flexeril which has not resolved the pain.  She takes celebrex chronically for shoulder pain she is currently undergoing evaluation for by her orthopedist,  Dr Lajoyce Corners,  In fact saw his this week,  But forgot to mention her hip pain as it was hurting her the day of this visit.  Pain is worse with weight bearing, and attempt to "roll her leg outward".   It is better at rest.     The history is provided by the patient.    Past Medical History  Diagnosis Date  . Hypertension   . Arthritis   . Asthma   . Hypothyroidism     takes levothyroxen  . Anxiety   . Shortness of breath     with exertion  . Anemia     taking iron now.   . Pneumonia     hx of PNA  . Headache(784.0)     HX OF MIGRAINES  . OSA on CPAP    Past Surgical History  Procedure Laterality Date  . Cesarean section    . Cholecystectomy    . Knee arthroscopy    . Rotator cuff repair    . Joint replacement  2011    total left knee  . Tubal ligation  1996  . Knee arthroplasty  04/23/2012  . Total knee arthroplasty  04/23/2012    Procedure: TOTAL KNEE ARTHROPLASTY;  Surgeon: [name], MD;  Location: [facility] OR;  Service: Orthopedics;  Laterality: Right;  Right Total Knee Arthroplasty  . Endometrial ablation     Family History  Problem Relation Age of Onset  . Cancer Mother     colon  . Epilepsy Mother    . Cancer Father     prostate  . Diabetes Father   . Hypertension Father   . Hypertension Maternal Aunt   . Diabetes Maternal Aunt   . Hypertension Paternal Aunt    History  Substance Use Topics  . Smoking status: Former Smoker    Quit date: 04/24/1984  . Smokeless tobacco: Never Used  . Alcohol Use: No   OB History   Grav Para Term Preterm Abortions TAB SAB Ect Mult Living                 Review of Systems  Constitutional: Negative for fever.  Musculoskeletal: Positive for arthralgias. Negative for myalgias and joint swelling.  Neurological: Negative for weakness and numbness.    Allergies  Lisinopril; Bee venom; Codeine; Darvocet; Meloxicam; and Tomato  Home Medications   Current Outpatient Rx  Name  Route  Sig  Dispense  Refill  . albuterol (PROVENTIL HFA;VENTOLIN HFA) 108 (90 BASE) MCG/ACT inhaler   Inhalation   Inhale 2 puffs into the lungs every 6 (six) hours as needed for wheezing or shortness of breath.         [name]  amLODipine (NORVASC) 5 MG tablet   Oral   Take 5 mg by mouth every morning.          . celecoxib (CELEBREX) 200 MG capsule   Oral   Take 200 mg by mouth 2 (two) times daily.         . cholecalciferol (VITAMIN D) 1000 UNITS tablet   Oral   Take 1,000 Units by mouth daily.         . ferrous sulfate 325 (65 FE) MG tablet   Oral   Take 650 mg by mouth daily with breakfast.          . hydrochlorothiazide 25 MG tablet   Oral   Take 25 mg by mouth every morning.          [name] HYDROcodone-acetaminophen (NORCO) 7.5-325 MG per tablet   Oral   Take 1 tablet by mouth every 4 (four) hours as needed for pain.   20 tablet   0   . levothyroxine (SYNTHROID, LEVOTHROID) 75 MCG tablet   Oral   Take 75 mcg by mouth daily.         [name] loratadine (CLARITIN) 10 MG tablet   Oral   Take 10 mg by mouth daily.           . Multiple Vitamin (MULTIVITAMIN) tablet   Oral   Take 1 tablet by mouth daily.         [name] oxybutynin (DITROPAN-XL) 5 MG  24 hr tablet   Oral   Take 5 mg by mouth daily.         [name] senna (SENOKOT) 8.6 MG TABS   Oral   Take 1 tablet by mouth daily as needed. For constipation         . traMADol (ULTRAM) 50 MG tablet   Oral   Take 50 mg by mouth at bedtime as needed for pain.         [name] oxyCODONE-acetaminophen (PERCOCET/ROXICET) 5-325 MG per tablet   Oral   Take 1 tablet by mouth every 4 (four) hours as needed for pain.   20 tablet   0    BP 174/94  Pulse 65  Temp(Src) 98.6 F (37 C) (Oral)  Resp 18  Ht 5\' 3"  (1.6 m)  Wt 305 lb (138.347 kg)  BMI 54.04 kg/m2  SpO2 95% Physical Exam  Constitutional: She appears well-developed and well-nourished.  HENT:  Head: Atraumatic.  Neck: Normal range of motion.  Cardiovascular:  Pulses equal bilaterally  Musculoskeletal: She exhibits tenderness.  ttp along left anterior lateral thigh.  Worsened with attempts to flex the quadricep muscles and with active internal rotation of the left hip,  No pain with external rotation.    Neurological: She is alert. She has normal strength. She displays normal reflexes. No sensory deficit.  Equal strength  Skin: Skin is warm and dry.  Psychiatric: She has a normal mood and affect.    ED Course  Procedures (including critical care time) Labs Review Labs Reviewed - No data to display Imaging Review Dg Hip Complete Left  06/04/2013   *RADIOLOGY REPORT*  Clinical Data: Left hip pain  LEFT HIP - COMPLETE 2+ VIEW  Comparison: None.  Findings: No fracture or dislocation is noted. Sacroiliac joints appear normal.  Mild narrowing of the left hip joint is noted with sclerosis noted in the superior acetabulum consistent with mild degenerative joint disease.  IMPRESSION: Mild degenerative joint disease of left hip.  No fracture or dislocation is  noted.   Original Report Authenticated By: Lupita Raider.,  M.D.    MDM   1. Osteoarthritis of left hip   2. Acute hip pain, left    Patients labs and/or radiological studies  were viewed and considered during the medical decision making and disposition process. Pt was prescribed percocet.  Encouraged heat therapy,  Continue with flexeril and her celebrex.  She has appt with her pcp in 4 days for recheck of this condition.  She was encouraged to keep this appt.    The patient appears reasonably screened and/or stabilized for discharge and I doubt any other medical condition or other [facility] requiring further screening, evaluation, or treatment in the ED at this time prior to discharge.     Burgess Amor, PA-C 06/04/13 1929

## 2013-06-04 NOTE — ED Provider Notes (Signed)
Medical screening examination/treatment/procedure(s) were performed by non-physician practitioner and as supervising physician I was immediately available for consultation/collaboration.   Glynn Octave, MD 06/04/13 2051

## 2013-10-28 ENCOUNTER — Other Ambulatory Visit (HOSPITAL_COMMUNITY): Payer: Self-pay | Admitting: Family Medicine

## 2013-10-28 DIAGNOSIS — Z1231 Encounter for screening mammogram for malignant neoplasm of breast: Secondary | ICD-10-CM

## 2013-11-26 ENCOUNTER — Encounter (HOSPITAL_COMMUNITY): Payer: Self-pay | Admitting: Emergency Medicine

## 2013-11-26 ENCOUNTER — Emergency Department (HOSPITAL_COMMUNITY)
Admission: EM | Admit: 2013-11-26 | Discharge: 2013-11-26 | Disposition: A | Discharge status: Home / Self Care | Payer: Medicare Other | Attending: Emergency Medicine | Admitting: Emergency Medicine

## 2013-11-26 ENCOUNTER — Emergency Department (HOSPITAL_COMMUNITY): Payer: Medicare Other

## 2013-11-26 ENCOUNTER — Ambulatory Visit (HOSPITAL_COMMUNITY)
Admission: RE | Admit: 2013-11-26 | Discharge: 2013-11-26 | Disposition: A | Discharge status: Home / Self Care | Payer: Medicare Other | Source: Ambulatory Visit | Attending: Family Medicine | Admitting: Family Medicine

## 2013-11-26 DIAGNOSIS — Z1231 Encounter for screening mammogram for malignant neoplasm of breast: Secondary | ICD-10-CM

## 2013-11-26 DIAGNOSIS — G43909 Migraine, unspecified, not intractable, without status migrainosus: Secondary | ICD-10-CM | POA: Insufficient documentation

## 2013-11-26 DIAGNOSIS — G4733 Obstructive sleep apnea (adult) (pediatric): Secondary | ICD-10-CM | POA: Insufficient documentation

## 2013-11-26 DIAGNOSIS — F411 Generalized anxiety disorder: Secondary | ICD-10-CM | POA: Insufficient documentation

## 2013-11-26 DIAGNOSIS — M129 Arthropathy, unspecified: Secondary | ICD-10-CM | POA: Insufficient documentation

## 2013-11-26 DIAGNOSIS — E669 Obesity, unspecified: Secondary | ICD-10-CM | POA: Insufficient documentation

## 2013-11-26 DIAGNOSIS — Z79899 Other long term (current) drug therapy: Secondary | ICD-10-CM | POA: Insufficient documentation

## 2013-11-26 DIAGNOSIS — E039 Hypothyroidism, unspecified: Secondary | ICD-10-CM | POA: Insufficient documentation

## 2013-11-26 DIAGNOSIS — Z791 Long term (current) use of non-steroidal anti-inflammatories (NSAID): Secondary | ICD-10-CM | POA: Insufficient documentation

## 2013-11-26 DIAGNOSIS — D649 Anemia, unspecified: Secondary | ICD-10-CM | POA: Insufficient documentation

## 2013-11-26 DIAGNOSIS — M722 Plantar fascial fibromatosis: Secondary | ICD-10-CM | POA: Insufficient documentation

## 2013-11-26 DIAGNOSIS — I10 Essential (primary) hypertension: Secondary | ICD-10-CM | POA: Insufficient documentation

## 2013-11-26 DIAGNOSIS — Z8701 Personal history of pneumonia (recurrent): Secondary | ICD-10-CM | POA: Insufficient documentation

## 2013-11-26 DIAGNOSIS — Z87891 Personal history of nicotine dependence: Secondary | ICD-10-CM | POA: Insufficient documentation

## 2013-11-26 DIAGNOSIS — J45909 Unspecified asthma, uncomplicated: Secondary | ICD-10-CM | POA: Insufficient documentation

## 2013-11-26 MED ORDER — HYDROCODONE-ACETAMINOPHEN 7.5-325 MG PO TABS: 1.0000 | ORAL_TABLET | Freq: Four times a day (QID) | ORAL | Status: DC | PRN

## 2013-11-26 NOTE — ED Notes (Signed)
Patient c/o bilateral foot pain. Denies any known injury or swelling. Per patient originally pain only in left foot but has now started in right foot. Per patient has appointment to see podiatrist next month but states pain is to bad. Per patient took Aleve last night with no relief.

## 2013-11-26 NOTE — Discharge Instructions (Signed)

## 2013-11-28 NOTE — ED Provider Notes (Signed)
Medical screening examination/treatment/procedure(s) were performed by non-physician practitioner and as supervising physician I was immediately available for consultation/collaboration.   EKG Interpretation None        Laray AngerKathleen M Lady Wisham, DO 11/28/13 1411

## 2013-11-28 NOTE — ED Provider Notes (Signed)
CSN: [id number]     Arrival date & time 11/26/13  [id number] History   First MD Initiated Contact with Patient 11/26/13 [phone]     Chief Complaint  Patient presents with  . Foot Pain     (Consider location/radiation/quality/duration/timing/severity/associated sxs/prior Treatment) Patient is a 43 y.o. female presenting with lower extremity pain. The history is provided by the patient.  Foot Pain This is a chronic problem. The current episode started more than 1 month ago. The problem occurs constantly. The problem has been gradually worsening. Associated symptoms include arthralgias. Pertinent negatives include no chills, fever, joint swelling, myalgias, numbness, rash, vomiting or weakness. The symptoms are aggravated by standing and walking. She has tried NSAIDs for the symptoms. The treatment provided no relief.   patient c/o bilateral foot and heel pain for approximately one month.  States the sx's began in left foot and now also has similar pain in the right foot as well.  Pain is worse in the morning and with excessive weight bearing,  She denies injury, redness, fever, chills, swelling or calf pain.  Pt has appt with podiatry next month, but pain is worsening and uncontrolled with NSAID   Past Medical History  Diagnosis Date  . Hypertension   . Arthritis   . Asthma   . Hypothyroidism     takes levothyroxen  . Anxiety   . Shortness of breath     with exertion  . Anemia     taking iron now.   . Pneumonia     hx of PNA  . Headache(784.0)     HX OF MIGRAINES  . OSA on CPAP    Past Surgical History  Procedure Laterality Date  . Cesarean section    . Cholecystectomy    . Knee arthroscopy    . Rotator cuff repair    . Joint replacement  2011    total left knee  . Tubal ligation  1996  . Knee arthroplasty  04/23/2012  . Total knee arthroplasty  04/23/2012    Procedure: TOTAL KNEE ARTHROPLASTY;  Surgeon: Nadara MustardMarcus V Duda, MD;  Location: [facility] OR;  Service: Orthopedics;  Laterality: Right;   Right Total Knee Arthroplasty  . Endometrial ablation     Family History  Problem Relation Age of Onset  . Cancer Mother     colon  . Epilepsy Mother   . Cancer Father     prostate  . Diabetes Father   . Hypertension Father   . Hypertension Maternal Aunt   . Diabetes Maternal Aunt   . Hypertension Paternal Aunt    History  Substance Use Topics  . Smoking status: Former Smoker -- 0.25 packs/day for 3 years    Types: Cigarettes    Quit date: 04/24/1984  . Smokeless tobacco: Never Used  . Alcohol Use: No   OB History   Grav Para Term Preterm Abortions TAB SAB Ect Mult Living   2 2 2       2      Review of Systems  Constitutional: Negative for fever and chills.  Gastrointestinal: Negative for vomiting.  Genitourinary: Negative for dysuria and difficulty urinating.  Musculoskeletal: Positive for arthralgias. Negative for back pain, joint swelling and myalgias.  Skin: Negative for color change, rash and wound.  Neurological: Negative for weakness and numbness.  All other systems reviewed and are negative.      Allergies  Lisinopril; Bee venom; Codeine; Darvocet; Meloxicam; and Tomato  Home Medications   Current Outpatient Rx  Name  Route  Sig  Dispense  Refill  . albuterol (PROVENTIL HFA;VENTOLIN HFA) 108 (90 BASE) MCG/ACT inhaler   Inhalation   Inhale 2 puffs into the lungs every 6 (six) hours as needed for wheezing or shortness of breath.         [name] amLODipine (NORVASC) 5 MG tablet   Oral   Take 5 mg by mouth every morning.          . celecoxib (CELEBREX) 200 MG capsule   Oral   Take 200 mg by mouth 2 (two) times daily.         . cholecalciferol (VITAMIN D) 1000 UNITS tablet   Oral   Take 1,000 Units by mouth daily.         . ferrous sulfate 325 (65 FE) MG tablet   Oral   Take 650 mg by mouth daily with breakfast.          . hydrochlorothiazide 25 MG tablet   Oral   Take 25 mg by mouth every morning.          [name] HYDROcodone-acetaminophen  (NORCO) 7.5-325 MG per tablet   Oral   Take 1 tablet by mouth every 4 (four) hours as needed for pain.   20 tablet   0   . HYDROcodone-acetaminophen (NORCO) 7.5-325 MG per tablet   Oral   Take 1 tablet by mouth every 6 (six) hours as needed for moderate pain.   20 tablet   0   . levothyroxine (SYNTHROID, LEVOTHROID) 75 MCG tablet   Oral   Take 75 mcg by mouth daily.         [name] loratadine (CLARITIN) 10 MG tablet   Oral   Take 10 mg by mouth daily.           . Multiple Vitamin (MULTIVITAMIN) tablet   Oral   Take 1 tablet by mouth daily.         [name] oxybutynin (DITROPAN-XL) 5 MG 24 hr tablet   Oral   Take 5 mg by mouth daily.         [name] oxyCODONE-acetaminophen (PERCOCET/ROXICET) 5-325 MG per tablet   Oral   Take 1 tablet by mouth every 4 (four) hours as needed for pain.   20 tablet   0   . senna (SENOKOT) 8.6 MG TABS   Oral   Take 1 tablet by mouth daily as needed. For constipation         . traMADol (ULTRAM) 50 MG tablet   Oral   Take 50 mg by mouth at bedtime as needed for pain.          BP 160/70  Pulse 66  Temp(Src) 98.1 F (36.7 C)  Resp 24  Ht 5\' 3"  (1.6 m)  Wt 305 lb (138.347 kg)  BMI 54.04 kg/m2  SpO2 100% Physical Exam  Nursing note and vitals reviewed. Constitutional: She is oriented to person, place, and time. She appears well-developed and well-nourished. No distress.  Pt is obese  HENT:  Head: Normocephalic and atraumatic.  Cardiovascular: Normal rate, regular rhythm, normal heart sounds and intact distal pulses.   Pulmonary/Chest: Effort normal and breath sounds normal.  Musculoskeletal: She exhibits tenderness. She exhibits no edema.       Left foot: She exhibits tenderness. She exhibits normal range of motion, no bony tenderness, no swelling, normal capillary refill, no crepitus, no deformity and no laceration.       Feet:  Localized ttp of the bilateral heel, pain  worse on left.  ROM is preserved.  DP pulse is brisk,distal  sensation intact.  No erythema, abrasion, bruising or bony deformity.  No proximal tenderness or erythema.  Achille's tendon intact and NT.  Compartments soft  Neurological: She is alert and oriented to person, place, and time. She exhibits normal muscle tone. Coordination normal.  Skin: Skin is warm and dry.    ED Course  Procedures (including critical care time) Labs Review Labs Reviewed - No data to display Imaging Review Dg Foot Complete Left  11/26/2013   CLINICAL DATA:  Foot pain bearing weight  EXAM: LEFT FOOT - COMPLETE 3+ VIEW  COMPARISON:  None.  FINDINGS: There is no evidence of fracture or dislocation. There is no evidence of arthropathy or other focal bone abnormality. Soft tissues are unremarkable.  IMPRESSION: Negative.   Electronically Signed   By: Marlan Palau M.D.   On: 11/26/2013 10:33    EKG Interpretation None      MDM   Final diagnoses:  Plantar fasciitis      patient is ambulatory, well appearing.  No concerning sx's for cellulitis or DVT.  Vitals improved on recheck.    Foot XR read as negative, but on my evaluation of the image, pt has a large calcaneal spur that may also be source of her foot pain given the patient has heel pain.  XR findings discussed with patient.  She agrees to f/u with podiatry and try shoe inserts   Patient is stable for discharge and agrees to plan  Sarahlynn Cisnero L. Halley Shepheard, PA-C 11/28/13 1318

## 2014-03-04 ENCOUNTER — Other Ambulatory Visit (HOSPITAL_COMMUNITY): Payer: Self-pay | Admitting: Adult Health Nurse Practitioner

## 2014-03-04 ENCOUNTER — Ambulatory Visit (HOSPITAL_COMMUNITY)
Admission: RE | Admit: 2014-03-04 | Discharge: 2014-03-04 | Disposition: A | Discharge status: Home / Self Care | Payer: PRIVATE HEALTH INSURANCE | Source: Ambulatory Visit | Attending: Adult Health Nurse Practitioner | Admitting: Adult Health Nurse Practitioner

## 2014-03-04 DIAGNOSIS — M549 Dorsalgia, unspecified: Secondary | ICD-10-CM

## 2014-03-04 DIAGNOSIS — M546 Pain in thoracic spine: Secondary | ICD-10-CM

## 2014-03-04 DIAGNOSIS — M47817 Spondylosis without myelopathy or radiculopathy, lumbosacral region: Secondary | ICD-10-CM | POA: Insufficient documentation

## 2014-03-16 ENCOUNTER — Other Ambulatory Visit (HOSPITAL_COMMUNITY): Payer: Self-pay | Admitting: Adult Health Nurse Practitioner

## 2014-03-16 ENCOUNTER — Other Ambulatory Visit: Payer: Medicare Other

## 2014-03-16 ENCOUNTER — Other Ambulatory Visit: Payer: Self-pay | Admitting: Adult Health Nurse Practitioner

## 2014-03-16 DIAGNOSIS — IMO0002 Reserved for concepts with insufficient information to code with codable children: Secondary | ICD-10-CM

## 2014-03-17 ENCOUNTER — Encounter (HOSPITAL_COMMUNITY): Payer: Self-pay | Admitting: Emergency Medicine

## 2014-03-17 ENCOUNTER — Ambulatory Visit (HOSPITAL_COMMUNITY)
Admission: RE | Admit: 2014-03-17 | Discharge: 2014-03-17 | Disposition: A | Discharge status: Home / Self Care | Payer: PRIVATE HEALTH INSURANCE | Source: Ambulatory Visit | Attending: Adult Health Nurse Practitioner | Admitting: Adult Health Nurse Practitioner

## 2014-03-17 ENCOUNTER — Emergency Department (HOSPITAL_COMMUNITY)
Admission: EM | Admit: 2014-03-17 | Discharge: 2014-03-17 | Disposition: A | Discharge status: Home / Self Care | Payer: PRIVATE HEALTH INSURANCE | Attending: Emergency Medicine | Admitting: Emergency Medicine

## 2014-03-17 DIAGNOSIS — M51379 Other intervertebral disc degeneration, lumbosacral region without mention of lumbar back pain or lower extremity pain: Secondary | ICD-10-CM | POA: Insufficient documentation

## 2014-03-17 DIAGNOSIS — M129 Arthropathy, unspecified: Secondary | ICD-10-CM | POA: Diagnosis not present

## 2014-03-17 DIAGNOSIS — F40298 Other specified phobia: Secondary | ICD-10-CM | POA: Insufficient documentation

## 2014-03-17 DIAGNOSIS — D649 Anemia, unspecified: Secondary | ICD-10-CM | POA: Diagnosis not present

## 2014-03-17 DIAGNOSIS — F410 Panic disorder [episodic paroxysmal anxiety] without agoraphobia: Secondary | ICD-10-CM | POA: Diagnosis not present

## 2014-03-17 DIAGNOSIS — Z8701 Personal history of pneumonia (recurrent): Secondary | ICD-10-CM | POA: Insufficient documentation

## 2014-03-17 DIAGNOSIS — Z79899 Other long term (current) drug therapy: Secondary | ICD-10-CM | POA: Insufficient documentation

## 2014-03-17 DIAGNOSIS — I10 Essential (primary) hypertension: Secondary | ICD-10-CM | POA: Insufficient documentation

## 2014-03-17 DIAGNOSIS — M79609 Pain in unspecified limb: Secondary | ICD-10-CM | POA: Diagnosis not present

## 2014-03-17 DIAGNOSIS — IMO0002 Reserved for concepts with insufficient information to code with codable children: Secondary | ICD-10-CM

## 2014-03-17 DIAGNOSIS — Z9981 Dependence on supplemental oxygen: Secondary | ICD-10-CM | POA: Diagnosis not present

## 2014-03-17 DIAGNOSIS — J45909 Unspecified asthma, uncomplicated: Secondary | ICD-10-CM | POA: Insufficient documentation

## 2014-03-17 DIAGNOSIS — Z87891 Personal history of nicotine dependence: Secondary | ICD-10-CM | POA: Diagnosis not present

## 2014-03-17 DIAGNOSIS — E039 Hypothyroidism, unspecified: Secondary | ICD-10-CM | POA: Diagnosis not present

## 2014-03-17 DIAGNOSIS — M5137 Other intervertebral disc degeneration, lumbosacral region: Secondary | ICD-10-CM | POA: Diagnosis not present

## 2014-03-17 DIAGNOSIS — M47817 Spondylosis without myelopathy or radiculopathy, lumbosacral region: Secondary | ICD-10-CM | POA: Insufficient documentation

## 2014-03-17 DIAGNOSIS — M549 Dorsalgia, unspecified: Secondary | ICD-10-CM | POA: Insufficient documentation

## 2014-03-17 DIAGNOSIS — Z791 Long term (current) use of non-steroidal anti-inflammatories (NSAID): Secondary | ICD-10-CM | POA: Diagnosis not present

## 2014-03-17 DIAGNOSIS — M48061 Spinal stenosis, lumbar region without neurogenic claudication: Secondary | ICD-10-CM | POA: Diagnosis not present

## 2014-03-17 DIAGNOSIS — F411 Generalized anxiety disorder: Secondary | ICD-10-CM | POA: Diagnosis present

## 2014-03-17 DIAGNOSIS — G4733 Obstructive sleep apnea (adult) (pediatric): Secondary | ICD-10-CM | POA: Diagnosis not present

## 2014-03-17 NOTE — ED Provider Notes (Signed)
TIME SEEN:  8:20 PM  CHIEF COMPLAINT: panic attack  HPI Comments: Marilyn [name] is a 43 y.o. female who presents to the Emergency Department complaining of attention, asthma, hypothyroidism, anxiety who was having an MRI of her back today and begin having a panic attack. Patient states she normally has to take Valium prior to her MRI but took a Xanax instead. She states during the end of the MRI she began to hyperventilate, felt short of breath and claustrophobic. She was sent to the emergency department for evaluation. She denies having any current complaints now. Chest pain or shortness of breath. No numbness, tingling or focal weakness.   ROS: See HPI Constitutional: no fever  Eyes: no drainage  ENT: no runny nose   Cardiovascular:  no chest pain  Resp: no SOB  GI: no vomiting GU: no dysuria Integumentary: no rash  Allergy: no hives  Musculoskeletal: no leg swelling  Neurological: no slurred speech ROS otherwise negative  PAST MEDICAL HISTORY/PAST SURGICAL HISTORY:  Past Medical History  Diagnosis Date  . Hypertension   . Arthritis   . Asthma   . Hypothyroidism     takes levothyroxen  . Anxiety   . Shortness of breath     with exertion  . Anemia     taking iron now.   . Pneumonia     hx of PNA  . Headache(784.0)     HX OF MIGRAINES  . OSA on CPAP     MEDICATIONS:  Prior to Admission medications   Medication Sig Start Date End Date Taking? Authorizing Provider  albuterol (PROVENTIL HFA;VENTOLIN HFA) 108 (90 BASE) MCG/ACT inhaler Inhale 2 puffs into the lungs every 6 (six) hours as needed for wheezing or shortness of breath.    Historical Provider, MD  amLODipine (NORVASC) 5 MG tablet Take 5 mg by mouth every morning.     Historical Provider, MD  celecoxib (CELEBREX) 200 MG capsule Take 200 mg by mouth 2 (two) times daily.    Historical Provider, MD  cholecalciferol (VITAMIN D) 1000 UNITS tablet Take 1,000 Units by mouth daily.    Historical Provider, MD  ferrous  sulfate 325 (65 FE) MG tablet Take 650 mg by mouth daily with breakfast.     Historical Provider, MD  hydrochlorothiazide 25 MG tablet Take 25 mg by mouth every morning.     Historical Provider, MD  HYDROcodone-acetaminophen (NORCO) 7.5-325 MG per tablet Take 1 tablet by mouth every 4 (four) hours as needed for pain. 04/03/13   Kathie DikeHobson M Bryant, PA-C  HYDROcodone-acetaminophen (NORCO) 7.5-325 MG per tablet Take 1 tablet by mouth every 6 (six) hours as needed for moderate pain. 11/26/13   Tammy L. Triplett, PA-C  levothyroxine (SYNTHROID, LEVOTHROID) 75 MCG tablet Take 75 mcg by mouth daily.    Historical Provider, MD  loratadine (CLARITIN) 10 MG tablet Take 10 mg by mouth daily.      Historical Provider, MD  Multiple Vitamin (MULTIVITAMIN) tablet Take 1 tablet by mouth daily.    Historical Provider, MD  oxybutynin (DITROPAN-XL) 5 MG 24 hr tablet Take 5 mg by mouth daily.    Historical Provider, MD  oxyCODONE-acetaminophen (PERCOCET/ROXICET) 5-325 MG per tablet Take 1 tablet by mouth every 4 (four) hours as needed for pain. 06/04/13   Burgess AmorJulie Idol, PA-C  senna (SENOKOT) 8.6 MG TABS Take 1 tablet by mouth daily as needed. For constipation    Historical Provider, MD  traMADol (ULTRAM) 50 MG tablet Take 50 mg by mouth at  bedtime as needed for pain.    Historical Provider, MD    ALLERGIES:  Allergies  Allergen Reactions  . Lisinopril Anaphylaxis  . Bee Venom Swelling  . Codeine Other (See Comments)    Upset stomach  . Darvocet [Propoxyphene N-Acetaminophen] Hives  . Meloxicam     Diarrhea, insomnia, constipation  . Tomato Rash    SOCIAL HISTORY:  History  Substance Use Topics  . Smoking status: Former Smoker -- 0.25 packs/day for 3 years    Types: Cigarettes    Quit date: 04/24/1984  . Smokeless tobacco: Never Used  . Alcohol Use: No    FAMILY HISTORY: Family History  Problem Relation Age of Onset  . Cancer Mother     colon  . Epilepsy Mother   . Cancer Father     prostate  .  Diabetes Father   . Hypertension Father   . Hypertension Maternal Aunt   . Diabetes Maternal Aunt   . Hypertension Paternal Aunt     EXAM: Triage Vitals: BP 154/89  Pulse 62  Temp(Src) 97.9 F (36.6 C) (Oral)  Resp 16  Ht 5\' 3"  (1.6 m)  Wt 308 lb (139.708 kg)  BMI 54.57 kg/m2  SpO2 99% CONSTITUTIONAL: Alert and oriented and responds appropriately to questions. Well-appearing; well-nourished, drowsy but easily arousable, protecting her airway HEAD: Normocephalic EYES: Conjunctivae clear, PERRL ENT: normal nose; no rhinorrhea; moist mucous membranes; pharynx without lesions noted NECK: Supple, no meningismus, no LAD  CARD: RRR; S1 and S2 appreciated; no murmurs, no clicks, no rubs, no gallops RESP: Normal chest excursion without splinting or tachypnea; breath sounds clear and equal bilaterally; no wheezes, no rhonchi, no rales,  ABD/GI: Normal bowel sounds; non-distended; soft, non-tender, no rebound, no guarding BACK:  The back appears normal and is non-tender to palpation, there is no CVA tenderness EXT: Normal ROM in all joints; non-tender to palpation; no edema; normal capillary refill; no cyanosis    SKIN: Normal color for age and race; warm NEURO: Moves all extremities equally PSYCH: The patient's mood and manner are appropriate. Grooming and personal hygiene are appropriate.  MEDICAL DECISION MAKING: Patient here with panic attack due to claustrophobia while getting an MRI. She states this feels like prior panic attack she is having the past. She is now appropriate, no complaints. I feel she is safe to be discharged home with her family. She is very drowsy but did take 2 Xanax prior to her MRI. She is here with family and I feel they are safe to take her home.     Layla MawKristen N Chace Klippel, DO 03/17/14 2104

## 2014-03-17 NOTE — ED Notes (Signed)
She was having an MRI and is claustrophobic, she normally gets valium when she has an MRI and she was given xanax to take. Had a panic attack while in the machine and can't calm down per family.

## 2014-03-17 NOTE — Discharge Instructions (Signed)

## 2014-03-18 ENCOUNTER — Other Ambulatory Visit: Payer: Medicare Other

## 2014-05-06 ENCOUNTER — Ambulatory Visit: Payer: PRIVATE HEALTH INSURANCE | Attending: Anesthesiology | Admitting: Physical Therapy

## 2014-05-06 DIAGNOSIS — E119 Type 2 diabetes mellitus without complications: Secondary | ICD-10-CM | POA: Insufficient documentation

## 2014-05-06 DIAGNOSIS — J45909 Unspecified asthma, uncomplicated: Secondary | ICD-10-CM | POA: Insufficient documentation

## 2014-05-06 DIAGNOSIS — R5381 Other malaise: Secondary | ICD-10-CM | POA: Insufficient documentation

## 2014-05-06 DIAGNOSIS — IMO0001 Reserved for inherently not codable concepts without codable children: Secondary | ICD-10-CM | POA: Insufficient documentation

## 2014-05-06 DIAGNOSIS — I10 Essential (primary) hypertension: Secondary | ICD-10-CM | POA: Insufficient documentation

## 2014-05-06 DIAGNOSIS — M545 Low back pain, unspecified: Secondary | ICD-10-CM | POA: Insufficient documentation

## 2014-05-11 ENCOUNTER — Ambulatory Visit: Payer: PRIVATE HEALTH INSURANCE | Admitting: Physical Therapy

## 2014-05-11 DIAGNOSIS — IMO0001 Reserved for inherently not codable concepts without codable children: Secondary | ICD-10-CM | POA: Diagnosis not present

## 2014-05-12 ENCOUNTER — Ambulatory Visit: Payer: PRIVATE HEALTH INSURANCE | Admitting: Physical Therapy

## 2014-05-12 DIAGNOSIS — IMO0001 Reserved for inherently not codable concepts without codable children: Secondary | ICD-10-CM | POA: Diagnosis not present

## 2014-05-26 ENCOUNTER — Ambulatory Visit: Payer: PRIVATE HEALTH INSURANCE | Attending: Anesthesiology | Admitting: Physical Therapy

## 2014-05-26 DIAGNOSIS — Z96659 Presence of unspecified artificial knee joint: Secondary | ICD-10-CM | POA: Insufficient documentation

## 2014-05-26 DIAGNOSIS — J45909 Unspecified asthma, uncomplicated: Secondary | ICD-10-CM | POA: Diagnosis not present

## 2014-05-26 DIAGNOSIS — M545 Low back pain, unspecified: Secondary | ICD-10-CM | POA: Diagnosis not present

## 2014-05-26 DIAGNOSIS — IMO0001 Reserved for inherently not codable concepts without codable children: Secondary | ICD-10-CM | POA: Diagnosis present

## 2014-05-26 DIAGNOSIS — I10 Essential (primary) hypertension: Secondary | ICD-10-CM | POA: Diagnosis not present

## 2014-05-26 DIAGNOSIS — R5381 Other malaise: Secondary | ICD-10-CM | POA: Diagnosis not present

## 2014-05-26 DIAGNOSIS — E119 Type 2 diabetes mellitus without complications: Secondary | ICD-10-CM | POA: Insufficient documentation

## 2014-05-26 DIAGNOSIS — M47817 Spondylosis without myelopathy or radiculopathy, lumbosacral region: Secondary | ICD-10-CM | POA: Insufficient documentation

## 2014-05-27 ENCOUNTER — Encounter: Payer: Medicare Other | Admitting: Physical Therapy

## 2014-07-19 ENCOUNTER — Encounter (HOSPITAL_COMMUNITY): Payer: Self-pay | Admitting: Emergency Medicine

## 2014-08-18 ENCOUNTER — Other Ambulatory Visit: Payer: Self-pay | Admitting: Orthopedic Surgery

## 2014-08-18 DIAGNOSIS — M25512 Pain in left shoulder: Secondary | ICD-10-CM

## 2014-08-28 ENCOUNTER — Ambulatory Visit
Admission: RE | Admit: 2014-08-28 | Discharge: 2014-08-28 | Disposition: A | Discharge status: Home / Self Care | Payer: Medicare Other | Source: Ambulatory Visit | Attending: Orthopedic Surgery | Admitting: Orthopedic Surgery

## 2014-08-28 DIAGNOSIS — M25512 Pain in left shoulder: Secondary | ICD-10-CM

## 2014-10-18 ENCOUNTER — Ambulatory Visit: Payer: Medicare Other | Admitting: Physical Therapy

## 2014-10-25 ENCOUNTER — Ambulatory Visit: Payer: Medicare Other | Admitting: Physical Therapy

## 2014-11-19 ENCOUNTER — Other Ambulatory Visit: Payer: Self-pay

## 2014-11-19 DIAGNOSIS — Z1231 Encounter for screening mammogram for malignant neoplasm of breast: Secondary | ICD-10-CM

## 2014-11-30 ENCOUNTER — Ambulatory Visit: Payer: Medicare Other

## 2014-12-09 ENCOUNTER — Ambulatory Visit
Admission: RE | Admit: 2014-12-09 | Discharge: 2014-12-09 | Disposition: A | Discharge status: Home / Self Care | Payer: Medicare Other | Source: Ambulatory Visit

## 2014-12-09 DIAGNOSIS — Z1231 Encounter for screening mammogram for malignant neoplasm of breast: Secondary | ICD-10-CM

## 2015-03-12 ENCOUNTER — Emergency Department (HOSPITAL_COMMUNITY)
Admission: EM | Admit: 2015-03-12 | Discharge: 2015-03-13 | Disposition: A | Discharge status: Home / Self Care | Payer: Medicare Other | Attending: Emergency Medicine | Admitting: Emergency Medicine

## 2015-03-12 ENCOUNTER — Encounter (HOSPITAL_COMMUNITY): Payer: Self-pay | Admitting: *Deleted

## 2015-03-12 ENCOUNTER — Emergency Department (HOSPITAL_COMMUNITY): Payer: Medicare Other

## 2015-03-12 DIAGNOSIS — F419 Anxiety disorder, unspecified: Secondary | ICD-10-CM | POA: Insufficient documentation

## 2015-03-12 DIAGNOSIS — R0789 Other chest pain: Secondary | ICD-10-CM | POA: Diagnosis not present

## 2015-03-12 DIAGNOSIS — M1990 Unspecified osteoarthritis, unspecified site: Secondary | ICD-10-CM | POA: Diagnosis not present

## 2015-03-12 DIAGNOSIS — R600 Localized edema: Secondary | ICD-10-CM | POA: Insufficient documentation

## 2015-03-12 DIAGNOSIS — R079 Chest pain, unspecified: Secondary | ICD-10-CM | POA: Diagnosis present

## 2015-03-12 DIAGNOSIS — I10 Essential (primary) hypertension: Secondary | ICD-10-CM | POA: Diagnosis not present

## 2015-03-12 DIAGNOSIS — Z79899 Other long term (current) drug therapy: Secondary | ICD-10-CM | POA: Insufficient documentation

## 2015-03-12 DIAGNOSIS — Z9981 Dependence on supplemental oxygen: Secondary | ICD-10-CM | POA: Insufficient documentation

## 2015-03-12 DIAGNOSIS — Z87891 Personal history of nicotine dependence: Secondary | ICD-10-CM | POA: Insufficient documentation

## 2015-03-12 DIAGNOSIS — G4733 Obstructive sleep apnea (adult) (pediatric): Secondary | ICD-10-CM | POA: Diagnosis not present

## 2015-03-12 DIAGNOSIS — Z8701 Personal history of pneumonia (recurrent): Secondary | ICD-10-CM | POA: Diagnosis not present

## 2015-03-12 DIAGNOSIS — E039 Hypothyroidism, unspecified: Secondary | ICD-10-CM | POA: Insufficient documentation

## 2015-03-12 DIAGNOSIS — Z862 Personal history of diseases of the blood and blood-forming organs and certain disorders involving the immune mechanism: Secondary | ICD-10-CM | POA: Insufficient documentation

## 2015-03-12 LAB — CBC WITH DIFFERENTIAL/PLATELET
BASOS ABS: 0 10*3/uL (ref 0.0–0.1)
Basophils Relative: 0 % (ref 0–1)
Eosinophils Absolute: 0.5 10*3/uL (ref 0.0–0.7)
Eosinophils Relative: 5 % (ref 0–5)
HEMATOCRIT: 37 % (ref 36.0–46.0)
HEMOGLOBIN: 11.7 g/dL — AB (ref 12.0–15.0)
LYMPHS ABS: 4 10*3/uL (ref 0.7–4.0)
Lymphocytes Relative: 43 % (ref 12–46)
MCH: 27.2 pg (ref 26.0–34.0)
MCHC: 31.6 g/dL (ref 30.0–36.0)
MCV: 86 fL (ref 78.0–100.0)
MONO ABS: 0.3 10*3/uL (ref 0.1–1.0)
Monocytes Relative: 3 % (ref 3–12)
NEUTROS PCT: 49 % (ref 43–77)
Neutro Abs: 4.6 10*3/uL (ref 1.7–7.7)
Platelets: 335 10*3/uL (ref 150–400)
RBC: 4.3 MIL/uL (ref 3.87–5.11)
RDW: 14.3 % (ref 11.5–15.5)
WBC: 9.4 10*3/uL (ref 4.0–10.5)

## 2015-03-12 LAB — COMPREHENSIVE METABOLIC PANEL
ALT: 29 U/L (ref 14–54)
ANION GAP: 10 (ref 5–15)
AST: 25 U/L (ref 15–41)
Albumin: 3.9 g/dL (ref 3.5–5.0)
Alkaline Phosphatase: 72 U/L (ref 38–126)
BUN: 18 mg/dL (ref 6–20)
CALCIUM: 9 mg/dL (ref 8.9–10.3)
CHLORIDE: 102 mmol/L (ref 101–111)
CO2: 28 mmol/L (ref 22–32)
Creatinine, Ser: 1.16 mg/dL — ABNORMAL HIGH (ref 0.44–1.00)
GFR calc Af Amer: >60 mL/min (ref >60)
GFR, EST NON AFRICAN AMERICAN: 56 mL/min — AB (ref >60)
Glucose, Bld: 103 mg/dL — ABNORMAL HIGH (ref 65–99)
Potassium: 3.6 mmol/L (ref 3.5–5.1)
SODIUM: 140 mmol/L (ref 135–145)
Total Bilirubin: 1.1 mg/dL (ref 0.3–1.2)
Total Protein: 7.6 g/dL (ref 6.5–8.1)

## 2015-03-12 LAB — TROPONIN I

## 2015-03-12 MED ORDER — ASPIRIN 81 MG PO CHEW
324.0000 mg | CHEWABLE_TABLET | Freq: Once | ORAL | Status: AC
  Administered 2015-03-12: 324 mg via ORAL
  Filled 2015-03-12: qty 4

## 2015-03-12 MED ORDER — KETOROLAC TROMETHAMINE 30 MG/ML IJ SOLN
30.0000 mg | Freq: Once | INTRAMUSCULAR | Status: AC
  Administered 2015-03-12: 30 mg via INTRAVENOUS
  Filled 2015-03-12: qty 1

## 2015-03-12 NOTE — ED Provider Notes (Signed)
CSN: [id number]     Arrival date & time 03/12/15  2209 History  This chart was scribed for Dione Booze, MD by Evon Slack, ED Scribe. This patient was seen in room APA02/APA02 and the patient's care was started at 11:05 PM.    Chief Complaint  Patient presents with  . Chest Pain    Patient is a 44 y.o. female presenting with chest pain. The history is provided by the patient. No language interpreter was used.  Chest Pain Associated symptoms: no diaphoresis, no nausea, no shortness of breath and not vomiting    HPI Comments: Marilyn [name] is a 44 y.o. female with PMHX of HTN, SOB and anemia who presents to the Emergency Department complaining of new sharp chest pain onset tonight at 9:30 PM. Pt rates the severity of her pain at is worse 9/10 and currently 5/10. Pt describes the pain as a pressure on her chest. Pt states that the pain was intermittently radiating up into her left shoulder. Pt states that onset of pain began while she was driving. Pt doesn't report any alleviating or worsening factors. Pt doesn't report any medications PTA.  Denies SOB, nausea, vomiting or diaphoresis. Denies any recent long distant travel or surgery. Pt reports Hx of chest wall tenderness but states that her pain tonight is worse.   Past Medical History  Diagnosis Date  . Hypertension   . Arthritis   . Asthma   . Hypothyroidism     takes levothyroxen  . Anxiety   . Shortness of breath     with exertion  . Anemia     taking iron now.   . Pneumonia     hx of PNA  . Headache(784.0)     HX OF MIGRAINES  . OSA on CPAP    Past Surgical History  Procedure Laterality Date  . Cesarean section    . Cholecystectomy    . Knee arthroscopy    . Rotator cuff repair    . Joint replacement  2011    total left knee  . Tubal ligation  1996  . Knee arthroplasty  04/23/2012  . Total knee arthroplasty  04/23/2012    Procedure: TOTAL KNEE ARTHROPLASTY;  Surgeon: [name], MD;  Location: [facility] OR;  Service:  Orthopedics;  Laterality: Right;  Right Total Knee Arthroplasty  . Endometrial ablation     Family History  Problem Relation Age of Onset  . Cancer Mother     colon  . Epilepsy Mother   . Cancer Father     prostate  . Diabetes Father   . Hypertension Father   . Hypertension Maternal Aunt   . Diabetes Maternal Aunt   . Hypertension Paternal Aunt    History  Substance Use Topics  . Smoking status: Former Smoker -- 0.25 packs/day for 3 years    Types: Cigarettes    Quit date: 04/24/1984  . Smokeless tobacco: Never Used  . Alcohol Use: No   OB History    Gravida Para Term Preterm AB TAB SAB Ectopic Multiple Living   Review of Systems  Constitutional: Negative for diaphoresis.  Respiratory: Negative for shortness of breath.   Cardiovascular: Positive for chest pain.  Gastrointestinal: Negative for nausea and vomiting.  All other systems reviewed and are negative.     Allergies  Lisinopril; Bee venom; Codeine; Darvocet; Meloxicam; and Tomato  Home Medications   Prior to  Admission medications   Medication Sig Start Date End Date Taking? Authorizing Provider  amLODipine (NORVASC) 5 MG tablet Take 5 mg by mouth every morning.    Yes Historical Provider, MD  celecoxib (CELEBREX) 200 MG capsule Take 200 mg by mouth 2 (two) times daily. 01/09/15  Yes Historical Provider, MD  cetirizine (ZYRTEC) 10 MG tablet Take 10 mg by mouth daily as needed for allergies.   Yes Historical Provider, MD  cyclobenzaprine (FLEXERIL) 10 MG tablet Take 10 mg by mouth 3 (three) times daily as needed for muscle spasms.   Yes Historical Provider, MD  hydrochlorothiazide 25 MG tablet Take 25 mg by mouth every morning.    Yes Historical Provider, MD  HYDROcodone-acetaminophen (NORCO) 7.5-325 MG per tablet Take 1 tablet by mouth every 4 (four) hours as needed for pain. 04/03/13  Yes Ivery Quale, PA-C  levothyroxine (SYNTHROID, LEVOTHROID) 100 MCG tablet Take 100 mcg by mouth daily  before breakfast.   Yes Historical Provider, MD  MICRONIZED COLESTIPOL HCL 1 G tablet Take 2 g by mouth 2 (two) times daily. 02/18/15  Yes Historical Provider, MD  naproxen (NAPROSYN) 500 MG tablet Take 500 mg by mouth 2 (two) times daily. 03/08/15  Yes Historical Provider, MD  oxybutynin (DITROPAN-XL) 10 MG 24 hr tablet Take 10 mg by mouth at bedtime.   Yes Historical Provider, MD  oxyCODONE-acetaminophen (PERCOCET) 7.5-325 MG per tablet Take 1 tablet by mouth every 6 (six) hours as needed. 03/01/15  Yes Historical Provider, MD  albuterol (PROVENTIL HFA;VENTOLIN HFA) 108 (90 BASE) MCG/ACT inhaler Inhale 2 puffs into the lungs every 6 (six) hours as needed for wheezing or shortness of breath.    Historical Provider, MD  ALPRAZolam Prudy Feeler) 1 MG tablet Take 2 mg by mouth once. For MRI    Historical Provider, MD   BP 171/86 mmHg  Pulse 68  Temp(Src) 98 F (36.7 C) (Oral)  Resp 29  Ht 5\' 3"  (1.6 m)  Wt 308 lb (139.708 kg)  BMI 54.57 kg/m2  SpO2 100%   Physical Exam  Constitutional: She is oriented to person, place, and time. She appears well-developed and well-nourished. No distress.  Obese.   HENT:  Head: Normocephalic and atraumatic.  Eyes: Conjunctivae and EOM are normal. Pupils are equal, round, and reactive to light.  Neck: Normal range of motion. Neck supple. No JVD present.  Cardiovascular: Normal rate, regular rhythm and normal heart sounds.   No murmur heard. Pulmonary/Chest: Effort normal and breath sounds normal. She has no wheezes. She has no rales. She exhibits tenderness.  Moderate bilateral parasternal tenderness which reproduces her pain  Abdominal: Soft. Bowel sounds are normal. She exhibits no distension and no mass. There is no tenderness.  Musculoskeletal: Normal range of motion. She exhibits edema.  Trace pitting edema   Lymphadenopathy:    She has no cervical adenopathy.  Neurological: She is alert and oriented to person, place, and time. No cranial nerve deficit. She  exhibits normal muscle tone. Coordination normal.  Skin: Skin is warm and dry. No rash noted.  Psychiatric: She has a normal mood and affect. Her behavior is normal. Judgment and thought content normal.  Nursing note and vitals reviewed.   ED Course  Procedures (including critical care time) DIAGNOSTIC STUDIES: Oxygen Saturation is 100% on RA, normal by my interpretation.    COORDINATION OF CARE: 11:17 PM-Discussed treatment plan with pt at bedside and pt agreed to plan.     Labs Review Labs Reviewed  CBC WITH DIFFERENTIAL/PLATELET -  Abnormal; Notable for the following:    Hemoglobin 11.7 (*)    All other components within normal limits  COMPREHENSIVE METABOLIC PANEL  TROPONIN I    Imaging Review Dg Chest Portable 1 View  03/12/2015   CLINICAL DATA:  Chest pain  EXAM: PORTABLE CHEST - 1 VIEW  COMPARISON:  12/10/2012  FINDINGS: Lungs are clear.  No pleural effusion or pneumothorax.  The heart is top-normal in size.  IMPRESSION: No evidence of acute cardiopulmonary disease.   Electronically Signed   By: Charline Bills M.D.   On: 03/12/2015 23:01     EKG Interpretation   Date/Time:  Saturday March 12 2015 22:18:04 EDT Ventricular Rate:  78 PR Interval:  177 QRS Duration: 81 QT Interval:  386 QTC Calculation: 440 R Axis:   52 Text Interpretation:  Sinus rhythm Normal ECG When compared with ECG of  12/10/2012, No significant change was found Confirmed by [facility]  MD, Tywanda Rice  ([id number]) on 03/12/2015 11:06:58 PM      MDM   Final diagnoses:  Chest wall pain      Chest pain is reproducible suspicious for chest wall pain. Old records are reviewed and she does have prior ED visit for chest wall pain. Heart score is ,2 putting the patient at very low risk. She was given a dose of aspirin and therapeutic trial of ibuprofen with good relief of pain. She is discharged with prescription for naproxen.   I personally performed the services described in this documentation, which was  scribed in my presence. The recorded information has been reviewed and is accurate.       Dione Booze, MD 03/13/15 [phone]

## 2015-03-12 NOTE — ED Notes (Signed)
Pt reports sharp chest pain that radiates to her left arm. Pain started about 1 hour ago while she was driving.

## 2015-03-13 DIAGNOSIS — R0789 Other chest pain: Secondary | ICD-10-CM | POA: Diagnosis not present

## 2015-03-13 MED ORDER — NAPROXEN 500 MG PO TABS: 500.0000 mg | ORAL_TABLET | Freq: Two times a day (BID) | ORAL | Status: DC

## 2015-03-13 MED ORDER — DEXAMETHASONE 4 MG PO TABS
12.0000 mg | ORAL_TABLET | Freq: Once | ORAL | Status: AC
  Administered 2015-03-13: 12 mg via ORAL
  Filled 2015-03-13: qty 3

## 2015-03-13 NOTE — Discharge Instructions (Signed)
Chest Wall Pain °Chest wall pain is pain in or around the bones and muscles of your chest. It may take up to 6 weeks to get better. It may take longer if you must stay physically active in your work and activities.  °CAUSES  °Chest wall pain may happen on its own. However, it may be caused by: °· A viral illness like the flu. °· Injury. °· Coughing. °· Exercise. °· Arthritis. °· Fibromyalgia. °· Shingles. °HOME CARE INSTRUCTIONS  °· Avoid overtiring physical activity. Try not to strain or perform activities that cause pain. This includes any activities using your chest or your abdominal and side muscles, especially if heavy weights are used. °· Put ice on the sore area. °¨ Put ice in a plastic bag. °¨ Place a towel between your skin and the bag. °¨ Leave the ice on for 15-20 minutes per hour while awake for the first 2 days. °· Only take over-the-counter or prescription medicines for pain, discomfort, or fever as directed by your caregiver. °SEEK IMMEDIATE MEDICAL CARE IF:  °· Your pain increases, or you are very uncomfortable. °· You have a fever. °· Your chest pain becomes worse. °· You have new, unexplained symptoms. °· You have nausea or vomiting. °· You feel sweaty or lightheaded. °· You have a cough with phlegm (sputum), or you cough up blood. °MAKE SURE YOU:  °· Understand these instructions. °· Will watch your condition. °· Will get help right away if you are not doing well or get worse. °Document Released: 09/03/2005 Document Revised: 11/26/2011 Document Reviewed: 04/30/2011 °ExitCare® Patient Information ©2015 [employer]. This information is not intended to replace advice given to you by your health care provider. Make sure you discuss any questions you have with your health care provider. ° °Naproxen and naproxen sodium oral immediate-release tablets °What is this medicine? °NAPROXEN (na PROX en) is a non-steroidal anti-inflammatory drug (NSAID). It is used to reduce swelling and to treat pain. This  medicine may be used for dental pain, headache, or painful monthly periods. It is also used for painful joint and muscular problems such as arthritis, tendinitis, bursitis, and gout. °This medicine may be used for other purposes; ask your health care provider or pharmacist if you have questions. °COMMON BRAND NAME(S): Aflaxen, Aleve, Aleve Arthritis, All Day Relief, Anaprox, Anaprox DS, Naprosyn °What should I tell my health care provider before I take this medicine? °They need to know if you have any of these conditions: °-asthma °-cigarette smoker °-drink more than 3 alcohol containing drinks a day °-heart disease or circulation problems such as heart failure or leg edema (fluid retention) °-high blood pressure °-kidney disease °-liver disease °-stomach bleeding or ulcers °-an unusual or allergic reaction to naproxen, aspirin, other NSAIDs, other medicines, foods, dyes, or preservatives °-pregnant or trying to get pregnant °-breast-feeding °How should I use this medicine? °Take this medicine by mouth with a glass of water. Follow the directions on the prescription label. Take it with food if your stomach gets upset. Try to not lie down for at least 10 minutes after you take it. Take your medicine at regular intervals. Do not take your medicine more often than directed. Long-term, continuous use may increase the risk of heart attack or stroke. °A special MedGuide will be given to you by the pharmacist with each prescription and refill. Be sure to read this information carefully each time. °Talk to your pediatrician regarding the use of this medicine in children. Special care may be needed. °Overdosage:   If you think you have taken too much of this medicine contact a poison control center or emergency room at once. °NOTE: This medicine is only for you. Do not share this medicine with others. °What if I miss a dose? °If you miss a dose, take it as soon as you can. If it is almost time for your next dose, take only  that dose. Do not take double or extra doses. °What may interact with this medicine? °-alcohol °-aspirin °-cidofovir °-diuretics °-lithium °-methotrexate °-other drugs for inflammation like ketorolac or prednisone °-pemetrexed °-probenecid °-warfarin °This list may not describe all possible interactions. Give your health care provider a list of all the medicines, herbs, non-prescription drugs, or dietary supplements you use. Also tell them if you smoke, drink alcohol, or use illegal drugs. Some items may interact with your medicine. °What should I watch for while using this medicine? °Tell your doctor or health care professional if your pain does not get better. Talk to your doctor before taking another medicine for pain. Do not treat yourself. °This medicine does not prevent heart attack or stroke. In fact, this medicine may increase the chance of a heart attack or stroke. The chance may increase with longer use of this medicine and in people who have heart disease. If you take aspirin to prevent heart attack or stroke, talk with your doctor or health care professional. °Do not take other medicines that contain aspirin, ibuprofen, or naproxen with this medicine. Side effects such as stomach upset, nausea, or ulcers may be more likely to occur. Many medicines available without a prescription should not be taken with this medicine. °This medicine can cause ulcers and bleeding in the stomach and intestines at any time during treatment. Do not smoke cigarettes or drink alcohol. These increase irritation to your stomach and can make it more susceptible to damage from this medicine. Ulcers and bleeding can happen without warning symptoms and can cause death. °You may get drowsy or dizzy. Do not drive, use machinery, or do anything that needs mental alertness until you know how this medicine affects you. Do not stand or sit up quickly, especially if you are an older patient. This reduces the risk of dizzy or fainting  spells. °This medicine can cause you to bleed more easily. Try to avoid damage to your teeth and gums when you brush or floss your teeth. °What side effects may I notice from receiving this medicine? °Side effects that you should report to your doctor or health care professional as soon as possible: °-black or bloody stools, blood in the urine or vomit °-blurred vision °-chest pain °-difficulty breathing or wheezing °-nausea or vomiting °-severe stomach pain °-skin rash, skin redness, blistering or peeling skin, hives, or itching °-slurred speech or weakness on one side of the body °-swelling of eyelids, throat, lips °-unexplained weight gain or swelling °-unusually weak or tired °-yellowing of eyes or skin °Side effects that usually do not require medical attention (report to your doctor or health care professional if they continue or are bothersome): °-constipation °-headache °-heartburn °This list may not describe all possible side effects. Call your doctor for medical advice about side effects. You may report side effects to FDA at 1-800-FDA-1088. °Where should I keep my medicine? °Keep out of the reach of children. °Store at room temperature between 15 and 30 degrees C (59 and 86 degrees F). Keep container tightly closed. Throw away any unused medicine after the expiration date. °NOTE: This sheet is a summary. It   may not cover all possible information. If you have questions about this medicine, talk to your doctor, pharmacist, or health care provider. °© 2015, [employer]. (2009-09-05 20:10:16) ° °

## 2015-03-13 NOTE — ED Notes (Signed)
Patient ambulatory to bathroom without difficulty.

## 2015-05-20 ENCOUNTER — Encounter (HOSPITAL_COMMUNITY): Payer: Self-pay | Admitting: Emergency Medicine

## 2015-05-20 ENCOUNTER — Emergency Department (HOSPITAL_COMMUNITY)
Admission: EM | Admit: 2015-05-20 | Discharge: 2015-05-20 | Disposition: A | Discharge status: Home / Self Care | Payer: Medicare Other | Attending: Emergency Medicine | Admitting: Emergency Medicine

## 2015-05-20 DIAGNOSIS — I10 Essential (primary) hypertension: Secondary | ICD-10-CM | POA: Insufficient documentation

## 2015-05-20 DIAGNOSIS — D649 Anemia, unspecified: Secondary | ICD-10-CM | POA: Diagnosis not present

## 2015-05-20 DIAGNOSIS — R221 Localized swelling, mass and lump, neck: Secondary | ICD-10-CM

## 2015-05-20 DIAGNOSIS — Z791 Long term (current) use of non-steroidal anti-inflammatories (NSAID): Secondary | ICD-10-CM | POA: Diagnosis not present

## 2015-05-20 DIAGNOSIS — Z8701 Personal history of pneumonia (recurrent): Secondary | ICD-10-CM | POA: Diagnosis not present

## 2015-05-20 DIAGNOSIS — Z87891 Personal history of nicotine dependence: Secondary | ICD-10-CM | POA: Insufficient documentation

## 2015-05-20 DIAGNOSIS — M1990 Unspecified osteoarthritis, unspecified site: Secondary | ICD-10-CM | POA: Diagnosis not present

## 2015-05-20 DIAGNOSIS — J029 Acute pharyngitis, unspecified: Secondary | ICD-10-CM | POA: Insufficient documentation

## 2015-05-20 DIAGNOSIS — E039 Hypothyroidism, unspecified: Secondary | ICD-10-CM | POA: Insufficient documentation

## 2015-05-20 DIAGNOSIS — R0789 Other chest pain: Secondary | ICD-10-CM | POA: Diagnosis not present

## 2015-05-20 DIAGNOSIS — Z91040 Latex allergy status: Secondary | ICD-10-CM | POA: Diagnosis not present

## 2015-05-20 DIAGNOSIS — G4730 Sleep apnea, unspecified: Secondary | ICD-10-CM | POA: Insufficient documentation

## 2015-05-20 DIAGNOSIS — Z7952 Long term (current) use of systemic steroids: Secondary | ICD-10-CM | POA: Diagnosis not present

## 2015-05-20 DIAGNOSIS — Z79899 Other long term (current) drug therapy: Secondary | ICD-10-CM | POA: Insufficient documentation

## 2015-05-20 DIAGNOSIS — F419 Anxiety disorder, unspecified: Secondary | ICD-10-CM | POA: Diagnosis not present

## 2015-05-20 DIAGNOSIS — J45909 Unspecified asthma, uncomplicated: Secondary | ICD-10-CM | POA: Insufficient documentation

## 2015-05-20 MED ORDER — PREDNISONE 20 MG PO TABS: 40.0000 mg | ORAL_TABLET | Freq: Every day | ORAL | Status: DC

## 2015-05-20 MED ORDER — PREDNISONE 50 MG PO TABS
60.0000 mg | ORAL_TABLET | Freq: Once | ORAL | Status: AC
  Administered 2015-05-20: 60 mg via ORAL
  Filled 2015-05-20 (×2): qty 1

## 2015-05-20 NOTE — ED Notes (Signed)
Pt able to swallow medication without difficulty

## 2015-05-20 NOTE — Discharge Instructions (Signed)
His throat looks a little swollen. It is likely either a reaction or a viral infection. return for worsening of symptoms. Take sterile it's for 2 days.

## 2015-05-20 NOTE — ED Provider Notes (Signed)
CSN: [id number]     Arrival date & time 05/20/15  1318 History   First MD Initiated Contact with Patient 05/20/15 1328     Chief Complaint  Patient presents with  . Oral Swelling     (Consider location/radiation/quality/duration/timing/severity/associated sxs/prior Treatment) The history is provided by the patient.   patient has had swelling in her throat. States she woke up this morning feeling a little tight in her. States her voice is harsh. 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 That was several years ago. No new medications. No fevers. She states it also feels little tight in the chest. No cough. No difficulty swallowing. No pain in her throat. No new medications. States around 2 weeks ago she had an injection in her left hip.  Past Medical History  Diagnosis Date  . Hypertension   . Arthritis   . Asthma   . Hypothyroidism     takes levothyroxen  . Anxiety   . Shortness of breath     with exertion  . Anemia     taking iron now.   . Pneumonia     hx of PNA  . Headache(784.0)     HX OF MIGRAINES  . OSA on CPAP    Past Surgical History  Procedure Laterality Date  . Cesarean section    . Cholecystectomy    . Knee arthroscopy    . Rotator cuff repair    . Joint replacement  2011    total left knee  . Tubal ligation  1996  . Knee arthroplasty  04/23/2012  . Total knee arthroplasty  04/23/2012    Procedure: TOTAL KNEE ARTHROPLASTY;  Surgeon: [name], MD;  Location: [facility] OR;  Service: Orthopedics;  Laterality: Right;  Right Total Knee Arthroplasty  . Endometrial ablation     Family History  Problem Relation Age of Onset  . Cancer Mother     colon  . Epilepsy Mother   . Cancer Father     prostate  . Diabetes Father   . Hypertension Father   . Hypertension Maternal Aunt   . Diabetes Maternal Aunt   . Hypertension Paternal Aunt    Social History  Substance Use Topics  . Smoking status: Former Smoker -- 0.25 packs/day for 3 years   Types: Cigarettes    Quit date: 04/24/1984  . Smokeless tobacco: Never Used  . Alcohol Use: No   OB History    Gravida Para Term Preterm AB TAB SAB Ectopic Multiple Living   Review of Systems  Constitutional: Negative for diaphoresis and appetite change.  HENT: Positive for sore throat and voice change. Negative for dental problem, drooling, facial swelling, tinnitus and trouble swallowing.   Respiratory: Positive for chest tightness. Negative for choking and shortness of breath.   Cardiovascular: Negative for chest pain.  Gastrointestinal: Negative for abdominal pain.  Genitourinary: Negative for dyspareunia.  Musculoskeletal: Negative for back pain.  Skin: Negative for wound.      Allergies  Lisinopril; Bee venom; Codeine; Darvocet; Meloxicam; Latex; and Tomato  Home Medications   Prior to Admission medications   Medication Sig Start Date End Date Taking? Authorizing Provider  albuterol (PROVENTIL HFA;VENTOLIN HFA) 108 (90 BASE) MCG/ACT inhaler Inhale 2 puffs into the lungs every 6 (six) hours as needed for wheezing or shortness of breath.   Yes Historical Provider, MD  ALPRAZolam Prudy Feeler) 1 MG tablet Take  2 mg by mouth once. For MRI   Yes Historical Provider, MD  amLODipine (NORVASC) 5 MG tablet Take 5 mg by mouth every morning.    Yes Historical Provider, MD  celecoxib (CELEBREX) 200 MG capsule Take 200 mg by mouth 2 (two) times daily. 01/09/15  Yes Historical Provider, MD  cetirizine (ZYRTEC) 10 MG tablet Take 10 mg by mouth daily as needed for allergies.   Yes Historical Provider, MD  cyclobenzaprine (FLEXERIL) 10 MG tablet Take 10 mg by mouth 3 (three) times daily as needed for muscle spasms.   Yes Historical Provider, MD  ENDOCET 10-325 MG per tablet Take 1 tablet by mouth every 4 (four) hours as needed. 04/21/15  Yes Historical Provider, MD  hydrochlorothiazide 25 MG tablet Take 25 mg by mouth every morning.    Yes Historical Provider, MD  levothyroxine  (SYNTHROID, LEVOTHROID) 100 MCG tablet Take 100 mcg by mouth daily before breakfast.   Yes Historical Provider, MD  naproxen (NAPROSYN) 500 MG tablet Take 1 tablet (500 mg total) by mouth 2 (two) times daily. 03/13/15  Yes Dione Booze, MD  oxybutynin (DITROPAN-XL) 10 MG 24 hr tablet Take 10 mg by mouth at bedtime.   Yes Historical Provider, MD  HYDROcodone-acetaminophen (NORCO) 7.5-325 MG per tablet Take 1 tablet by mouth every 4 (four) hours as needed for pain. Patient not taking: Reported on 05/20/2015 04/03/13   Ivery Quale, PA-C  oxyCODONE-acetaminophen (PERCOCET) 7.5-325 MG per tablet Take 1 tablet by mouth every 6 (six) hours as needed. 03/01/15   Historical Provider, MD  predniSONE (DELTASONE) 20 MG tablet Take 2 tablets (40 mg total) by mouth daily. 05/21/15   Benjiman Core, MD   BP 154/96 mmHg  Pulse 64  Temp(Src) 98.1 F (36.7 C) (Oral)  Resp 13  Ht 5\' 3"  (1.6 m)  Wt 300 lb (136.079 kg)  BMI 53.16 kg/m2  SpO2 100% Physical Exam  Constitutional: She appears well-developed and well-nourished.  HENT:  Mouth/Throat: No oropharyngeal exudate.  Slightly harsh voice. Mild posterior pharyngeal edema laterally. No erythema. No swelling of tongue.  Eyes: EOM are normal.  Neck: Neck supple. No thyromegaly present.  Cardiovascular: Normal rate and regular rhythm.   Pulmonary/Chest: Effort normal.  Abdominal: Soft.  Neurological: She is alert.  Skin: Skin is warm.    ED Course  Procedures (including critical care time) Labs Review Labs Reviewed - No data to display  Imaging Review No results found. I have personally reviewed and evaluated these images and lab results as part of my medical decision-making.   EKG Interpretation None      MDM   Final diagnoses:  Throat swelling    Patient is a feeling of swelling in the mouth. Patient appears stable. Does not appear to be a severe allergic reaction. Will discharge home with steroids    Benjiman Core, MD 05/21/15  2150

## 2015-05-20 NOTE — ED Notes (Addendum)
Pt c/o of difficulty swallowing and states, "I feel like my throat is starting to close up." Pt voice is hoarse. 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 Airway patent. Lung sounds clear. Sats 100 percent. Denies any new foods or medications. AOx4

## 2015-06-20 ENCOUNTER — Other Ambulatory Visit: Payer: Self-pay | Admitting: Nurse Practitioner

## 2015-06-20 DIAGNOSIS — M25552 Pain in left hip: Secondary | ICD-10-CM

## 2015-06-26 ENCOUNTER — Ambulatory Visit
Admission: RE | Admit: 2015-06-26 | Discharge: 2015-06-26 | Disposition: A | Discharge status: Home / Self Care | Payer: Medicare Other | Source: Ambulatory Visit | Attending: Nurse Practitioner | Admitting: Nurse Practitioner

## 2015-06-26 DIAGNOSIS — M25552 Pain in left hip: Secondary | ICD-10-CM

## 2015-07-24 ENCOUNTER — Encounter (HOSPITAL_COMMUNITY): Payer: Self-pay | Admitting: *Deleted

## 2015-07-24 ENCOUNTER — Emergency Department (HOSPITAL_COMMUNITY)
Admission: EM | Admit: 2015-07-24 | Discharge: 2015-07-24 | Disposition: A | Discharge status: Home / Self Care | Payer: Medicare Other | Attending: Emergency Medicine | Admitting: Emergency Medicine

## 2015-07-24 ENCOUNTER — Emergency Department (HOSPITAL_COMMUNITY): Payer: Medicare Other

## 2015-07-24 DIAGNOSIS — Y998 Other external cause status: Secondary | ICD-10-CM | POA: Insufficient documentation

## 2015-07-24 DIAGNOSIS — M1990 Unspecified osteoarthritis, unspecified site: Secondary | ICD-10-CM | POA: Diagnosis not present

## 2015-07-24 DIAGNOSIS — Z9981 Dependence on supplemental oxygen: Secondary | ICD-10-CM | POA: Insufficient documentation

## 2015-07-24 DIAGNOSIS — J45909 Unspecified asthma, uncomplicated: Secondary | ICD-10-CM | POA: Diagnosis not present

## 2015-07-24 DIAGNOSIS — Z79899 Other long term (current) drug therapy: Secondary | ICD-10-CM | POA: Diagnosis not present

## 2015-07-24 DIAGNOSIS — Z91040 Latex allergy status: Secondary | ICD-10-CM | POA: Diagnosis not present

## 2015-07-24 DIAGNOSIS — Z791 Long term (current) use of non-steroidal anti-inflammatories (NSAID): Secondary | ICD-10-CM | POA: Diagnosis not present

## 2015-07-24 DIAGNOSIS — Y9389 Activity, other specified: Secondary | ICD-10-CM | POA: Insufficient documentation

## 2015-07-24 DIAGNOSIS — G4733 Obstructive sleep apnea (adult) (pediatric): Secondary | ICD-10-CM | POA: Diagnosis not present

## 2015-07-24 DIAGNOSIS — Y9289 Other specified places as the place of occurrence of the external cause: Secondary | ICD-10-CM | POA: Diagnosis not present

## 2015-07-24 DIAGNOSIS — F419 Anxiety disorder, unspecified: Secondary | ICD-10-CM | POA: Insufficient documentation

## 2015-07-24 DIAGNOSIS — E039 Hypothyroidism, unspecified: Secondary | ICD-10-CM | POA: Insufficient documentation

## 2015-07-24 DIAGNOSIS — Z9889 Other specified postprocedural states: Secondary | ICD-10-CM | POA: Insufficient documentation

## 2015-07-24 DIAGNOSIS — Z87891 Personal history of nicotine dependence: Secondary | ICD-10-CM | POA: Insufficient documentation

## 2015-07-24 DIAGNOSIS — I10 Essential (primary) hypertension: Secondary | ICD-10-CM | POA: Diagnosis not present

## 2015-07-24 DIAGNOSIS — Z862 Personal history of diseases of the blood and blood-forming organs and certain disorders involving the immune mechanism: Secondary | ICD-10-CM | POA: Insufficient documentation

## 2015-07-24 DIAGNOSIS — M19011 Primary osteoarthritis, right shoulder: Secondary | ICD-10-CM | POA: Diagnosis not present

## 2015-07-24 DIAGNOSIS — S4991XA Unspecified injury of right shoulder and upper arm, initial encounter: Secondary | ICD-10-CM | POA: Diagnosis present

## 2015-07-24 DIAGNOSIS — Z8701 Personal history of pneumonia (recurrent): Secondary | ICD-10-CM | POA: Insufficient documentation

## 2015-07-24 DIAGNOSIS — X58XXXA Exposure to other specified factors, initial encounter: Secondary | ICD-10-CM | POA: Diagnosis not present

## 2015-07-24 MED ORDER — PROMETHAZINE HCL 12.5 MG PO TABS
12.5000 mg | ORAL_TABLET | Freq: Once | ORAL | Status: AC
  Administered 2015-07-24: 12.5 mg via ORAL
  Filled 2015-07-24: qty 1

## 2015-07-24 MED ORDER — HYDROCODONE-ACETAMINOPHEN 5-325 MG PO TABS: 1.0000 | ORAL_TABLET | ORAL | Status: DC | PRN

## 2015-07-24 MED ORDER — HYDROCODONE-ACETAMINOPHEN 5-325 MG PO TABS
2.0000 | ORAL_TABLET | Freq: Once | ORAL | Status: AC
  Administered 2015-07-24: 2 via ORAL
  Filled 2015-07-24: qty 2

## 2015-07-24 NOTE — ED Provider Notes (Signed)
History  By signing my name below, I, Karle Plumber, attest that this documentation has been prepared under the direction and in the presence of Ivery Quale, PA-C. Electronically Signed: Karle Plumber, ED Scribe. 07/24/2015. 1:58 PM.  Chief Complaint  Patient presents with  . Shoulder Pain   The history is provided by the patient and medical records. No language interpreter was used.    HPI Comments:  Marilyn [name] is a 44 y.o. morbidly obese female who presents to the Emergency Department complaining of severe right shoulder pain onset two weeks ago. Pt states she heard a popping sound last night and now reports associated swelling of the fingers of the right hand. Pt states she has had surgery (performed by Dr. Lajoyce Corners) on the right shoulder in the past for a tendon repair and on the left for a tendon repair and rotator cuff repair. She is wearing a sling on the right arm but has not taken anything for pain. Touching or moving the area intensifies the pain. She denies alleviating factors. She denies numbness, tingling or weakness of the RUE, bruising or wounds. She denies any trauma, injury or fall.  Past Medical History  Diagnosis Date  . Hypertension   . Arthritis   . Asthma   . Hypothyroidism     takes levothyroxen  . Anxiety   . Shortness of breath     with exertion  . Anemia     taking iron now.   . Pneumonia     hx of PNA  . Headache(784.0)     HX OF MIGRAINES  . OSA on CPAP    Past Surgical History  Procedure Laterality Date  . Cesarean section    . Cholecystectomy    . Knee arthroscopy    . Rotator cuff repair    . Joint replacement  2011    total left knee  . Tubal ligation  1996  . Knee arthroplasty  04/23/2012  . Total knee arthroplasty  04/23/2012    Procedure: TOTAL KNEE ARTHROPLASTY;  Surgeon: [name], MD;  Location: [facility] OR;  Service: Orthopedics;  Laterality: Right;  Right Total Knee Arthroplasty  . Endometrial ablation     Family History   Problem Relation Age of Onset  . Cancer Mother     colon  . Epilepsy Mother   . Cancer Father     prostate  . Diabetes Father   . Hypertension Father   . Hypertension Maternal Aunt   . Diabetes Maternal Aunt   . Hypertension Paternal Aunt    Social History  Substance Use Topics  . Smoking status: Former Smoker -- 0.25 packs/day for 3 years    Types: Cigarettes    Quit date: 04/24/1984  . Smokeless tobacco: Never Used  . Alcohol Use: No   OB History    Gravida Para Term Preterm AB TAB SAB Ectopic Multiple Living   Review of Systems  Musculoskeletal: Positive for myalgias. Negative for joint swelling.  Skin: Negative for color change and wound.  Neurological: Negative for weakness and numbness.  All other systems reviewed and are negative.   Allergies  Lisinopril; Bee venom; Codeine; Darvocet; Meloxicam; Latex; and Tomato  Home Medications   Prior to Admission medications   Medication Sig Start Date End Date Taking? Authorizing Provider  albuterol (PROVENTIL HFA;VENTOLIN HFA) 108 (90 BASE) MCG/ACT inhaler Inhale 2 puffs into the lungs every 6 (six)  hours as needed for wheezing or shortness of breath.   Yes Historical Provider, MD  ALPRAZolam Prudy Feeler) 1 MG tablet Take 2 mg by mouth once as needed. For MRI   Yes Historical Provider, MD  amLODipine (NORVASC) 5 MG tablet Take 5 mg by mouth every morning.    Yes Historical Provider, MD  cetirizine (ZYRTEC) 10 MG tablet Take 10 mg by mouth daily as needed for allergies.   Yes Historical Provider, MD  cyclobenzaprine (FLEXERIL) 10 MG tablet Take 10 mg by mouth 3 (three) times daily as needed for muscle spasms.   Yes Historical Provider, MD  hydrochlorothiazide 25 MG tablet Take 25 mg by mouth every morning.    Yes Historical Provider, MD  levothyroxine (SYNTHROID, LEVOTHROID) 100 MCG tablet Take 100 mcg by mouth daily before breakfast.   Yes Historical Provider, MD  naproxen (NAPROSYN) 500 MG tablet Take 1  tablet (500 mg total) by mouth 2 (two) times daily. 03/13/15  Yes Dione Booze, MD  oxybutynin (DITROPAN-XL) 10 MG 24 hr tablet Take 10 mg by mouth at bedtime.   Yes Historical Provider, MD  oxyCODONE-acetaminophen (PERCOCET) 7.5-325 MG per tablet Take 1 tablet by mouth every 6 (six) hours as needed. 03/01/15  Yes Historical Provider, MD  HYDROcodone-acetaminophen (NORCO/VICODIN) 5-325 MG tablet Take 1 tablet by mouth every 4 (four) hours as needed. 07/24/15   Ivery Quale, PA-C   Triage Vitals: BP 153/91 mmHg  Pulse 89  Temp(Src) 97.8 F (36.6 C) (Oral)  Resp 20  Ht  (1.6 m)  Wt 310 lb (140.615 kg)  BMI 54.93 kg/m2  SpO2 97% Physical Exam  Constitutional: She is oriented to person, place, and time. She appears well-developed and well-nourished.  HENT:  Head: Normocephalic and atraumatic.  Eyes: EOM are normal.  Neck: Normal range of motion.  Cardiovascular: Normal rate, regular rhythm and normal heart sounds.  Exam reveals no gallop and no friction rub.   No murmur heard. Cap refill of RUE less than 2 seconds  Pulmonary/Chest: Effort normal and breath sounds normal. No respiratory distress. She has no wheezes. She has no rales.  Musculoskeletal: Normal range of motion. She exhibits tenderness.  No deformity of right hand or wrist Pain in anterior and posterior right shoulder No evidence of dislocation Bicep/tricep muscles with soreness  Neurological: She is alert and oriented to person, place, and time.  Sensory intact. Grip of RUE intact.  Skin: Skin is warm and dry.  Psychiatric: She has a normal mood and affect. Her behavior is normal.  Nursing note and vitals reviewed.   ED Course  Procedures (including critical care time) DIAGNOSTIC STUDIES: Oxygen Saturation is 97% on RA, normal by my interpretation.   COORDINATION OF CARE: 12:39 PM- Will X-Ray right shoulder and order pain medication. Pt verbalizes understanding and agrees to plan.  Medications   HYDROcodone-acetaminophen (NORCO/VICODIN) 5-325 MG per tablet 2 tablet (2 tablets Oral Given 07/24/15 1243)  promethazine (PHENERGAN) tablet 12.5 mg (12.5 mg Oral Given 07/24/15 1243)   Labs Review Labs Reviewed - No data to display  Imaging Review Dg Shoulder Right  07/24/2015  CLINICAL DATA:  44 year old female right shoulder pain for several weeks, acute exacerbation last night. Initial encounter. EXAM: RIGHT SHOULDER - 2+ VIEW COMPARISON:  Shoulder MRI 05/19/2013. FINDINGS: Bone mineralization is within normal limits. No glenohumeral joint dislocation. Proximal right humerus intact. Right clavicle and scapula appear intact. Mild to moderate degenerative overgrowth at the right acromioclavicular joint. Visible right ribs and lung parenchyma within  normal limits. IMPRESSION: Degenerative changes. No acute osseous abnormality identified about the right shoulder. Electronically Signed   By: Odessa FlemingH  Hall M.D.   On: 07/24/2015 13:39   I have personally reviewed and evaluated these images and lab results as part of my medical decision-making.   EKG Interpretation None      MDM  X-ray of the right shoulder shows degenerative changes, but no acute problems. The examination shows normal neurovascular status. The patient has a sling. She will be treated with Norco, and she has an allergy in the nonsteroidal anti-inflammatory family. Patient is to follow-up with Dr. Berna SpareMarcus dUDA.   Final diagnoses:  Primary osteoarthritis of right shoulder    **I have reviewed nursing notes, vital signs, and all appropriate lab and imaging results for this patient.* I personally performed the services described in this documentation, which was scribed in my presence. The recorded information has been reviewed and is accurate.    Ivery QualeHobson Dequavious Harshberger, PA-C 07/24/15 1512  Bethann BerkshireJoseph Zammit, MD 07/25/15 [phone]

## 2015-07-24 NOTE — ED Notes (Signed)
Hx of shoulder pain, pain for last couple of weeks but last night pt heard a pop and c/o swelling to fingers of right hand.  Pt with arm sling in place on arrival

## 2015-07-24 NOTE — ED Notes (Signed)
PA at bedside.

## 2015-07-24 NOTE — Discharge Instructions (Signed)
Your x-ray is negative for any dislocation or fracture. Please see Dr. Lajoyce Corners as soon as possible for orthopedic evaluation . Use Tylenol for mild discomfort. Use Norco for more severe pain. Osteoarthritis Osteoarthritis is a disease that causes soreness and inflammation of a joint. It occurs when the cartilage at the affected joint wears down. Cartilage acts as a cushion, covering the ends of bones where they meet to form a joint. Osteoarthritis is the most common form of arthritis. It often occurs in older people. The joints affected most often by this condition include those in the:  Ends of the fingers.  Thumbs.  Neck.  Lower back.  Knees.  Hips. CAUSES  Over time, the cartilage that covers the ends of bones begins to wear away. This causes bone to rub on bone, producing pain and stiffness in the affected joints.  RISK FACTORS Certain factors can increase your chances of having osteoarthritis, including:  Older age.  Excessive body weight.  Overuse of joints.  Previous joint injury. SIGNS AND SYMPTOMS   Pain, swelling, and stiffness in the joint.  Over time, the joint may lose its normal shape.  Small deposits of bone (osteophytes) may grow on the edges of the joint.  Bits of bone or cartilage can break off and float inside the joint space. This may cause more pain and damage. DIAGNOSIS  Your health care provider will do a physical exam and ask about your symptoms. Various tests may be ordered, such as:  X-rays of the affected joint.  Blood tests to rule out other types of arthritis. Additional tests may be used to diagnose your condition. TREATMENT  Goals of treatment are to control pain and improve joint function. Treatment plans may include:  A prescribed exercise program that allows for rest and joint relief.  A weight control plan.  Pain relief techniques, such as:  Properly applied heat and cold.  Electric pulses delivered to nerve endings under the skin  (transcutaneous electrical nerve stimulation [TENS]).  Massage.  Certain nutritional supplements.  Medicines to control pain, such as:  Acetaminophen.  Nonsteroidal anti-inflammatory drugs (NSAIDs), such as naproxen.  Narcotic or central-acting agents, such as tramadol.  Corticosteroids. These can be given orally or as an injection.  Surgery to reposition the bones and relieve pain (osteotomy) or to remove loose pieces of bone and cartilage. Joint replacement may be needed in advanced states of osteoarthritis. HOME CARE INSTRUCTIONS   Take medicines only as directed by your health care provider.  Maintain a healthy weight. Follow your health care provider's instructions for weight control. This may include dietary instructions.  Exercise as directed. Your health care provider can recommend specific types of exercise. These may include:  Strengthening exercises. These are done to strengthen the muscles that support joints affected by arthritis. They can be performed with weights or with exercise bands to add resistance.  Aerobic activities. These are exercises, such as brisk walking or low-impact aerobics, that get your heart pumping.  Range-of-motion activities. These keep your joints limber.  Balance and agility exercises. These help you maintain daily living skills.  Rest your affected joints as directed by your health care provider.  Keep all follow-up visits as directed by your health care provider. SEEK MEDICAL CARE IF:   Your skin turns red.  You develop a rash in addition to your joint pain.  You have worsening joint pain.  You have a fever along with joint or muscle aches. SEEK IMMEDIATE MEDICAL CARE IF:  You  have a significant loss of weight or appetite.  You have night sweats. FOR MORE INFORMATION   National Institute of Arthritis and Musculoskeletal and Skin Diseases: www.niams.http://www.myers.net/nih.gov  [employer] Aging: https://walker.com/www.nia.nih.gov  American College  of Rheumatology: www.rheumatology.org   This information is not intended to replace advice given to you by your health care provider. Make sure you discuss any questions you have with your health care provider.   Document Released: 09/03/2005 Document Revised: 09/24/2014 Document Reviewed: 05/11/2013 Elsevier Interactive Patient Education [employer]2016 Elsevier Inc.

## 2015-08-30 ENCOUNTER — Other Ambulatory Visit (HOSPITAL_COMMUNITY): Payer: Self-pay | Admitting: Orthopaedic Surgery

## 2015-09-02 ENCOUNTER — Encounter (HOSPITAL_COMMUNITY)
Admission: RE | Admit: 2015-09-02 | Discharge: 2015-09-02 | Disposition: A | Discharge status: Home / Self Care | Payer: Medicare Other | Source: Ambulatory Visit | Attending: Orthopaedic Surgery | Admitting: Orthopaedic Surgery

## 2015-09-02 ENCOUNTER — Encounter (HOSPITAL_COMMUNITY): Payer: Self-pay

## 2015-09-02 DIAGNOSIS — Z01812 Encounter for preprocedural laboratory examination: Secondary | ICD-10-CM | POA: Diagnosis present

## 2015-09-02 DIAGNOSIS — M1612 Unilateral primary osteoarthritis, left hip: Secondary | ICD-10-CM | POA: Insufficient documentation

## 2015-09-02 HISTORY — DX: Personal history of other diseases of the respiratory system: Z87.09

## 2015-09-02 HISTORY — DX: Gastro-esophageal reflux disease without esophagitis: K21.9

## 2015-09-02 HISTORY — DX: Dizziness and giddiness: R42

## 2015-09-02 HISTORY — DX: Angina pectoris, unspecified: I20.9

## 2015-09-02 HISTORY — DX: Presence of spectacles and contact lenses: Z97.3

## 2015-09-02 LAB — CBC
HEMATOCRIT: 36 % (ref 36.0–46.0)
HEMOGLOBIN: 11.1 g/dL — AB (ref 12.0–15.0)
MCH: 26.6 pg (ref 26.0–34.0)
MCHC: 30.8 g/dL (ref 30.0–36.0)
MCV: 86.3 fL (ref 78.0–100.0)
Platelets: 351 10*3/uL (ref 150–400)
RBC: 4.17 MIL/uL (ref 3.87–5.11)
RDW: 13.9 % (ref 11.5–15.5)
WBC: 5.9 10*3/uL (ref 4.0–10.5)

## 2015-09-02 LAB — HCG, SERUM, QUALITATIVE: PREG SERUM: NEGATIVE

## 2015-09-02 LAB — BASIC METABOLIC PANEL
ANION GAP: 10 (ref 5–15)
BUN: 13 mg/dL (ref 6–20)
CALCIUM: 9.3 mg/dL (ref 8.9–10.3)
CO2: 27 mmol/L (ref 22–32)
Chloride: 101 mmol/L (ref 101–111)
Creatinine, Ser: 1.1 mg/dL — ABNORMAL HIGH (ref 0.44–1.00)
GFR calc Af Amer: >60 mL/min (ref >60)
GFR calc non Af Amer: 59 mL/min — ABNORMAL LOW (ref >60)
Glucose, Bld: 104 mg/dL — ABNORMAL HIGH (ref 65–99)
POTASSIUM: 3.9 mmol/L (ref 3.5–5.1)
Sodium: 138 mmol/L (ref 135–145)

## 2015-09-02 LAB — SURGICAL PCR SCREEN
MRSA, PCR: NEGATIVE
Staphylococcus aureus: NEGATIVE

## 2015-09-02 LAB — PROTIME-INR
INR: 1.15 (ref 0.00–1.49)
Prothrombin Time: 14.9 seconds (ref 11.6–15.2)

## 2015-09-02 LAB — ABO/RH: ABO/RH(D): O POS

## 2015-09-02 NOTE — Progress Notes (Signed)
EKG / epic 03/14/2015 CXR/epic 03/12/2015

## 2015-09-02 NOTE — Progress Notes (Signed)
Dr Park BreedManny/Anesthesia in to see pt. No orders given. Anesthesia to see pt day of surgery.

## 2015-09-02 NOTE — Patient Instructions (Addendum)
Marilyn [name]  09/02/2015   Your procedure is scheduled on: Friday September 09, 2015  [facility] to [facility]  Entrance [place]  elevators to 3rd floor to  Short Stay Center at 5:30 AM.  Call this number if you have problems the morning of surgery [phone]   Remember: ONLY 1 PERSON MAY GO WITH YOU TO SHORT STAY TO GET  READY MORNING OF YOUR SURGERY.  Do not eat food or drink liquids :After Midnight.     Take these medicines the morning of surgery with A SIP OF WATER: Amlodipine (Norvasc); Ceterizine (Zyrtec) if needed; Levothyroxine; Oxycodone-Acetaminophen if needed; May use Albuterol Inhaler if needed (bring with you day of surgery)                               You may not have any metal on your body including hair pins and              piercings  Do not wear jewelry, make-up, lotions, powders or perfumes, deodorant             Do not wear nail polish.  Do not shave  48 hours prior to surgery.                Do not bring valuables to the hospital. [facility] IS NOT             RESPONSIBLE   FOR VALUABLES.  Contacts, dentures or bridgework may not be worn into surgery.  Leave suitcase in the car. After surgery it may be brought to your room.   BRING CPAP MASK AND TUBING WITH YOU DAY OF SURGERY              _____________________________________________________________________             [facility] Before surgery, you can play an important role.  Because skin is not sterile, your skin needs to be as free of germs as possible.  You can reduce the number of germs on your skin by washing with CHG (chlorahexidine gluconate) soap before surgery.  CHG is an antiseptic cleaner which kills germs and bonds with the skin to continue killing germs even after washing. Please DO NOT use if you have an allergy to CHG or antibacterial soaps.  If your skin becomes reddened/irritated stop using the CHG and inform your nurse when you  arrive at Short Stay. Do not shave (including legs and underarms) for at least 48 hours prior to the first CHG shower.  You may shave your face/neck. Please follow these instructions carefully:  1.  Shower with CHG Soap the night before surgery and the  morning of Surgery.  2.  If you choose to wash your hair, wash your hair first as usual with your  normal  shampoo.  3.  After you shampoo, rinse your hair and body thoroughly to remove the  shampoo.                           4.  Use CHG as you would any other liquid soap.  You can apply chg directly  to the skin and wash  Gently with a scrungie or clean washcloth.  5.  Apply the CHG Soap to your body ONLY FROM THE NECK DOWN.   Do not use on face/ open                           Wound or open sores. Avoid contact with eyes, ears mouth and genitals (private parts).                       Wash face,  Genitals (private parts) with your normal soap.             6.  Wash thoroughly, paying special attention to the area where your surgery  will be performed.  7.  Thoroughly rinse your body with warm water from the neck down.  8.  DO NOT shower/wash with your normal soap after using and rinsing off  the CHG Soap.                9.  Pat yourself dry with a clean towel.            10.  Wear clean pajamas.            11.  Place clean sheets on your bed the night of your first shower and do not  sleep with pets. Day of Surgery : Do not apply any lotions/deodorants the morning of surgery.  Please wear clean clothes to the hospital/surgery center.  FAILURE TO FOLLOW THESE INSTRUCTIONS MAY RESULT IN THE CANCELLATION OF YOUR SURGERY PATIENT SIGNATURE_________________________________  NURSE SIGNATURE__________________________________  ________________________________________________________________________

## 2015-09-03 LAB — HEMOGLOBIN A1C
HEMOGLOBIN A1C: 6.2 % — AB (ref 4.8–5.6)
Mean Plasma Glucose: 131 mg/dL

## 2015-09-06 NOTE — Progress Notes (Signed)
A1C results in epic per PAT visit 09/02/2015 sent to Dr Maureen Ralphs Blackman

## 2015-09-08 MED ORDER — DEXTROSE 5 % IV SOLN
3.0000 g | INTRAVENOUS | Status: AC
  Administered 2015-09-09: 3 g via INTRAVENOUS
  Filled 2015-09-08: qty 3000

## 2015-09-08 NOTE — Anesthesia Preprocedure Evaluation (Addendum)
Anesthesia Evaluation  Patient identified by MRN, date of birth, ID band Patient awake    Reviewed: Allergy & Precautions, NPO status , Patient's Chart, lab work & pertinent test results  Airway Mallampati: II  TM Distance: >3 FB Neck ROM: Full    Dental no notable dental hx.    Pulmonary shortness of breath, asthma , sleep apnea , former smoker,    Pulmonary exam normal breath sounds clear to auscultation       Cardiovascular hypertension, Pt. on medications + angina Normal cardiovascular exam Rhythm:Regular Rate:Normal     Neuro/Psych  Headaches, Anxiety    GI/Hepatic Neg liver ROS, GERD  ,  Endo/Other  Hypothyroidism Morbid obesity  Renal/GU negative Renal ROS  negative genitourinary   Musculoskeletal negative musculoskeletal ROS (+)   Abdominal (+) + obese,   Peds negative pediatric ROS (+)  Hematology negative hematology ROS (+)   Anesthesia Other Findings   Reproductive/Obstetrics negative OB ROS                            Anesthesia Physical Anesthesia Plan  ASA: III  Anesthesia Plan: General   Post-op Pain Management:    Induction: Intravenous  Airway Management Planned: Oral ETT  Additional Equipment:   Intra-op Plan:   Post-operative Plan: Extubation in OR  Informed Consent: I have reviewed the patients History and Physical, chart, labs and discussed the procedure including the risks, benefits and alternatives for the proposed anesthesia with the patient or authorized representative who has indicated his/her understanding and acceptance.   Dental advisory given  Plan Discussed with: CRNA  Anesthesia Plan Comments: (Discussed r/b/a of general and spinal anesthesia. She prefers general. She did have a general with her two knee arthroplasties without complication.)       Anesthesia Quick Evaluation

## 2015-09-09 ENCOUNTER — Encounter (HOSPITAL_COMMUNITY): Payer: Self-pay | Admitting: *Deleted

## 2015-09-09 ENCOUNTER — Inpatient Hospital Stay (HOSPITAL_COMMUNITY): Payer: Medicare Other

## 2015-09-09 ENCOUNTER — Inpatient Hospital Stay (HOSPITAL_COMMUNITY)
Admission: RE | Admit: 2015-09-09 | Discharge: 2015-09-12 | DRG: 470 | Disposition: A | Discharge status: Skilled Nursing Facility | Payer: Medicare Other | Source: Ambulatory Visit | Attending: Orthopaedic Surgery | Admitting: Orthopaedic Surgery

## 2015-09-09 ENCOUNTER — Inpatient Hospital Stay (HOSPITAL_COMMUNITY): Payer: Medicare Other | Admitting: Anesthesiology

## 2015-09-09 ENCOUNTER — Encounter (HOSPITAL_COMMUNITY)
Admission: RE | Disposition: A | Discharge status: Skilled Nursing Facility | Payer: Self-pay | Source: Ambulatory Visit | Attending: Orthopaedic Surgery

## 2015-09-09 DIAGNOSIS — Z419 Encounter for procedure for purposes other than remedying health state, unspecified: Secondary | ICD-10-CM

## 2015-09-09 DIAGNOSIS — Z87891 Personal history of nicotine dependence: Secondary | ICD-10-CM

## 2015-09-09 DIAGNOSIS — Z01812 Encounter for preprocedural laboratory examination: Secondary | ICD-10-CM | POA: Diagnosis not present

## 2015-09-09 DIAGNOSIS — Z96653 Presence of artificial knee joint, bilateral: Secondary | ICD-10-CM | POA: Diagnosis present

## 2015-09-09 DIAGNOSIS — E039 Hypothyroidism, unspecified: Secondary | ICD-10-CM | POA: Diagnosis present

## 2015-09-09 DIAGNOSIS — M25552 Pain in left hip: Secondary | ICD-10-CM | POA: Diagnosis present

## 2015-09-09 DIAGNOSIS — Z96651 Presence of right artificial knee joint: Secondary | ICD-10-CM | POA: Diagnosis present

## 2015-09-09 DIAGNOSIS — Z6841 Body Mass Index (BMI) 40.0 and over, adult: Secondary | ICD-10-CM | POA: Diagnosis not present

## 2015-09-09 DIAGNOSIS — D62 Acute posthemorrhagic anemia: Secondary | ICD-10-CM | POA: Diagnosis not present

## 2015-09-09 DIAGNOSIS — G4733 Obstructive sleep apnea (adult) (pediatric): Secondary | ICD-10-CM | POA: Diagnosis present

## 2015-09-09 DIAGNOSIS — Z96642 Presence of left artificial hip joint: Secondary | ICD-10-CM

## 2015-09-09 DIAGNOSIS — I10 Essential (primary) hypertension: Secondary | ICD-10-CM | POA: Diagnosis present

## 2015-09-09 DIAGNOSIS — M1612 Unilateral primary osteoarthritis, left hip: Principal | ICD-10-CM

## 2015-09-09 DIAGNOSIS — Z79899 Other long term (current) drug therapy: Secondary | ICD-10-CM | POA: Diagnosis not present

## 2015-09-09 HISTORY — PX: TOTAL HIP ARTHROPLASTY: SHX124

## 2015-09-09 LAB — GLUCOSE, CAPILLARY: Glucose-Capillary: 100 mg/dL — ABNORMAL HIGH (ref 65–99)

## 2015-09-09 SURGERY — ARTHROPLASTY, HIP, TOTAL, ANTERIOR APPROACH
Anesthesia: General | Site: Hip | Laterality: Left

## 2015-09-09 MED ORDER — ALBUTEROL SULFATE (2.5 MG/3ML) 0.083% IN NEBU: 3.0000 mL | INHALATION_SOLUTION | Freq: Four times a day (QID) | RESPIRATORY_TRACT | Status: DC | PRN

## 2015-09-09 MED ORDER — HYDROCHLOROTHIAZIDE 25 MG PO TABS
25.0000 mg | ORAL_TABLET | Freq: Every day | ORAL | Status: DC
  Administered 2015-09-09 – 2015-09-12 (×4): 25 mg via ORAL
  Filled 2015-09-09 (×4): qty 1

## 2015-09-09 MED ORDER — OXYCODONE HCL 5 MG PO TABS
5.0000 mg | ORAL_TABLET | ORAL | Status: DC | PRN
  Administered 2015-09-09: 10 mg via ORAL
  Administered 2015-09-09 (×2): 5 mg via ORAL
  Administered 2015-09-10: 10 mg via ORAL
  Administered 2015-09-10: 5 mg via ORAL
  Administered 2015-09-10 – 2015-09-12 (×11): 10 mg via ORAL
  Filled 2015-09-09 (×3): qty 2
  Filled 2015-09-09: qty 1
  Filled 2015-09-09 (×3): qty 2
  Filled 2015-09-09: qty 1
  Filled 2015-09-09 (×8): qty 2

## 2015-09-09 MED ORDER — AMLODIPINE BESYLATE 5 MG PO TABS
5.0000 mg | ORAL_TABLET | Freq: Every day | ORAL | Status: DC
  Administered 2015-09-10 – 2015-09-12 (×3): 5 mg via ORAL
  Filled 2015-09-09 (×3): qty 1

## 2015-09-09 MED ORDER — ZOLPIDEM TARTRATE 5 MG PO TABS: 5.0000 mg | ORAL_TABLET | Freq: Every evening | ORAL | Status: DC | PRN

## 2015-09-09 MED ORDER — GLYCOPYRROLATE 0.2 MG/ML IJ SOLN
INTRAMUSCULAR | Status: AC
  Filled 2015-09-09: qty 2

## 2015-09-09 MED ORDER — DIPHENHYDRAMINE HCL 12.5 MG/5ML PO ELIX: 12.5000 mg | ORAL_SOLUTION | ORAL | Status: DC | PRN

## 2015-09-09 MED ORDER — MIDAZOLAM HCL 5 MG/5ML IJ SOLN
INTRAMUSCULAR | Status: DC | PRN
  Administered 2015-09-09 (×2): 0.5 mg via INTRAVENOUS
  Administered 2015-09-09: 1 mg via INTRAVENOUS

## 2015-09-09 MED ORDER — METHOCARBAMOL 500 MG PO TABS
500.0000 mg | ORAL_TABLET | Freq: Four times a day (QID) | ORAL | Status: DC | PRN
  Administered 2015-09-09 – 2015-09-12 (×9): 500 mg via ORAL
  Filled 2015-09-09 (×9): qty 1

## 2015-09-09 MED ORDER — MIDAZOLAM HCL 2 MG/2ML IJ SOLN
INTRAMUSCULAR | Status: AC
Start: 2015-09-09 — End: 2015-09-09
  Filled 2015-09-09: qty 2

## 2015-09-09 MED ORDER — SUCCINYLCHOLINE CHLORIDE 20 MG/ML IJ SOLN
INTRAMUSCULAR | Status: DC | PRN
  Administered 2015-09-09: 160 mg via INTRAVENOUS

## 2015-09-09 MED ORDER — LABETALOL HCL 5 MG/ML IV SOLN: 5.0000 mg | INTRAVENOUS | Status: DC | PRN

## 2015-09-09 MED ORDER — GLYCOPYRROLATE 0.2 MG/ML IJ SOLN
INTRAMUSCULAR | Status: AC
  Filled 2015-09-09: qty 1

## 2015-09-09 MED ORDER — NEOSTIGMINE METHYLSULFATE 10 MG/10ML IV SOLN
INTRAVENOUS | Status: DC | PRN
  Administered 2015-09-09: 4 mg via INTRAVENOUS

## 2015-09-09 MED ORDER — LEVOTHYROXINE SODIUM 100 MCG PO TABS
100.0000 ug | ORAL_TABLET | Freq: Every day | ORAL | Status: DC
  Administered 2015-09-10 – 2015-09-12 (×3): 100 ug via ORAL
  Filled 2015-09-09 (×4): qty 1

## 2015-09-09 MED ORDER — BISACODYL 10 MG RE SUPP: 10.0000 mg | Freq: Every day | RECTAL | Status: DC | PRN

## 2015-09-09 MED ORDER — OXYBUTYNIN CHLORIDE ER 10 MG PO TB24
10.0000 mg | ORAL_TABLET | Freq: Every day | ORAL | Status: DC
  Administered 2015-09-09 – 2015-09-12 (×4): 10 mg via ORAL
  Filled 2015-09-09 (×4): qty 1

## 2015-09-09 MED ORDER — ALUM & MAG HYDROXIDE-SIMETH 200-200-20 MG/5ML PO SUSP: 30.0000 mL | ORAL | Status: DC | PRN

## 2015-09-09 MED ORDER — ONDANSETRON HCL 4 MG/2ML IJ SOLN
4.0000 mg | Freq: Four times a day (QID) | INTRAMUSCULAR | Status: DC | PRN
Start: 2015-09-09 — End: 2015-09-12

## 2015-09-09 MED ORDER — HYDROMORPHONE HCL 1 MG/ML IJ SOLN
0.2500 mg | INTRAMUSCULAR | Status: DC | PRN
  Administered 2015-09-09: 0.25 mg via INTRAVENOUS
  Administered 2015-09-09: 0.5 mg via INTRAVENOUS
  Administered 2015-09-09 (×2): 0.25 mg via INTRAVENOUS

## 2015-09-09 MED ORDER — FENTANYL CITRATE (PF) 250 MCG/5ML IJ SOLN
INTRAMUSCULAR | Status: AC
  Filled 2015-09-09: qty 5

## 2015-09-09 MED ORDER — LIDOCAINE HCL (CARDIAC) 20 MG/ML IV SOLN
INTRAVENOUS | Status: DC | PRN
  Administered 2015-09-09: 100 mg via INTRAVENOUS

## 2015-09-09 MED ORDER — PHENOL 1.4 % MT LIQD
1.0000 | OROMUCOSAL | Status: DC | PRN
  Filled 2015-09-09: qty 177

## 2015-09-09 MED ORDER — LACTATED RINGERS IV SOLN
INTRAVENOUS | Status: DC | PRN
  Administered 2015-09-09 (×2): via INTRAVENOUS

## 2015-09-09 MED ORDER — LABETALOL HCL 5 MG/ML IV SOLN
10.0000 mg | INTRAVENOUS | Status: DC | PRN
  Administered 2015-09-09: 10 mg via INTRAVENOUS

## 2015-09-09 MED ORDER — FENTANYL CITRATE (PF) 100 MCG/2ML IJ SOLN
INTRAMUSCULAR | Status: AC
  Filled 2015-09-09: qty 2

## 2015-09-09 MED ORDER — SODIUM CHLORIDE 0.9 % IR SOLN
Status: DC | PRN
  Administered 2015-09-09: 1000 mL

## 2015-09-09 MED ORDER — LABETALOL HCL 5 MG/ML IV SOLN
INTRAVENOUS | Status: DC | PRN
  Administered 2015-09-09 (×2): 5 mg via INTRAVENOUS

## 2015-09-09 MED ORDER — METOCLOPRAMIDE HCL 5 MG/ML IJ SOLN: 5.0000 mg | Freq: Three times a day (TID) | INTRAMUSCULAR | Status: DC | PRN

## 2015-09-09 MED ORDER — LABETALOL HCL 5 MG/ML IV SOLN
5.0000 mg | INTRAVENOUS | Status: DC | PRN
  Administered 2015-09-09: 5 mg via INTRAVENOUS

## 2015-09-09 MED ORDER — RIVAROXABAN 10 MG PO TABS
10.0000 mg | ORAL_TABLET | Freq: Every day | ORAL | Status: DC
  Administered 2015-09-10 – 2015-09-12 (×3): 10 mg via ORAL
  Filled 2015-09-09 (×4): qty 1

## 2015-09-09 MED ORDER — ONDANSETRON HCL 4 MG PO TABS: 4.0000 mg | ORAL_TABLET | Freq: Four times a day (QID) | ORAL | Status: DC | PRN

## 2015-09-09 MED ORDER — DEXAMETHASONE SODIUM PHOSPHATE 10 MG/ML IJ SOLN
INTRAMUSCULAR | Status: AC
  Filled 2015-09-09: qty 1

## 2015-09-09 MED ORDER — DEXAMETHASONE SODIUM PHOSPHATE 10 MG/ML IJ SOLN
INTRAMUSCULAR | Status: DC | PRN
  Administered 2015-09-09: 10 mg via INTRAVENOUS

## 2015-09-09 MED ORDER — HYDROMORPHONE HCL 1 MG/ML IJ SOLN: 0.5000 mg | INTRAMUSCULAR | Status: DC | PRN

## 2015-09-09 MED ORDER — ONDANSETRON HCL 4 MG/2ML IJ SOLN
INTRAMUSCULAR | Status: DC | PRN
  Administered 2015-09-09 (×2): 2 mg via INTRAVENOUS

## 2015-09-09 MED ORDER — TRANEXAMIC ACID 1000 MG/10ML IV SOLN
1000.0000 mg | INTRAVENOUS | Status: AC
  Administered 2015-09-09: 1000 mg via INTRAVENOUS
  Filled 2015-09-09: qty 10

## 2015-09-09 MED ORDER — MENTHOL 3 MG MT LOZG: 1.0000 | LOZENGE | OROMUCOSAL | Status: DC | PRN

## 2015-09-09 MED ORDER — LABETALOL HCL 5 MG/ML IV SOLN
INTRAVENOUS | Status: AC
  Filled 2015-09-09: qty 4

## 2015-09-09 MED ORDER — HYDROMORPHONE HCL 1 MG/ML IJ SOLN
INTRAMUSCULAR | Status: AC
  Filled 2015-09-09: qty 1

## 2015-09-09 MED ORDER — NEOSTIGMINE METHYLSULFATE 10 MG/10ML IV SOLN
INTRAVENOUS | Status: AC
  Filled 2015-09-09: qty 1

## 2015-09-09 MED ORDER — GLYCOPYRROLATE 0.2 MG/ML IJ SOLN
INTRAMUSCULAR | Status: DC | PRN
  Administered 2015-09-09: .8 mg via INTRAVENOUS

## 2015-09-09 MED ORDER — PROPOFOL 10 MG/ML IV BOLUS
INTRAVENOUS | Status: AC
  Filled 2015-09-09: qty 40

## 2015-09-09 MED ORDER — DOCUSATE SODIUM 100 MG PO CAPS
100.0000 mg | ORAL_CAPSULE | Freq: Two times a day (BID) | ORAL | Status: DC
  Administered 2015-09-09 – 2015-09-12 (×6): 100 mg via ORAL

## 2015-09-09 MED ORDER — SODIUM CHLORIDE 0.9 % IV SOLN
INTRAVENOUS | Status: DC
  Administered 2015-09-09 – 2015-09-10 (×2): via INTRAVENOUS

## 2015-09-09 MED ORDER — ONDANSETRON HCL 4 MG/2ML IJ SOLN
INTRAMUSCULAR | Status: AC
Start: 2015-09-09 — End: 2015-09-09
  Filled 2015-09-09: qty 2

## 2015-09-09 MED ORDER — PROPOFOL 10 MG/ML IV BOLUS
INTRAVENOUS | Status: DC | PRN
  Administered 2015-09-09: 50 mg via INTRAVENOUS
  Administered 2015-09-09: 200 mg via INTRAVENOUS

## 2015-09-09 MED ORDER — FENTANYL CITRATE (PF) 100 MCG/2ML IJ SOLN
INTRAMUSCULAR | Status: DC | PRN
Start: 2015-09-09 — End: 2015-09-09
  Administered 2015-09-09 (×5): 50 ug via INTRAVENOUS
  Administered 2015-09-09: 100 ug via INTRAVENOUS

## 2015-09-09 MED ORDER — PROMETHAZINE HCL 25 MG/ML IJ SOLN: 6.2500 mg | INTRAMUSCULAR | Status: DC | PRN

## 2015-09-09 MED ORDER — ROCURONIUM BROMIDE 100 MG/10ML IV SOLN
INTRAVENOUS | Status: DC | PRN
  Administered 2015-09-09: 30 mg via INTRAVENOUS

## 2015-09-09 MED ORDER — CEFAZOLIN SODIUM-DEXTROSE 2-3 GM-% IV SOLR
2.0000 g | Freq: Four times a day (QID) | INTRAVENOUS | Status: AC
  Administered 2015-09-09 (×2): 2 g via INTRAVENOUS
  Filled 2015-09-09 (×2): qty 50

## 2015-09-09 MED ORDER — METOCLOPRAMIDE HCL 10 MG PO TABS: 5.0000 mg | ORAL_TABLET | Freq: Three times a day (TID) | ORAL | Status: DC | PRN

## 2015-09-09 MED ORDER — METHOCARBAMOL 1000 MG/10ML IJ SOLN
500.0000 mg | Freq: Four times a day (QID) | INTRAVENOUS | Status: DC | PRN
  Administered 2015-09-09: 500 mg via INTRAVENOUS
  Filled 2015-09-09 (×2): qty 5

## 2015-09-09 SURGICAL SUPPLY — 36 items
BAG ZIPLOCK 12X15 (MISCELLANEOUS) IMPLANT
BENZOIN TINCTURE PRP APPL 2/3 (GAUZE/BANDAGES/DRESSINGS) IMPLANT
BLADE SAW SGTL 18X1.27X75 (BLADE) ×2 IMPLANT
CAPT HIP TOTAL 2 ×2 IMPLANT
CELLS DAT CNTRL 66122 CELL SVR (MISCELLANEOUS) ×1 IMPLANT
CLOTH BEACON ORANGE TIMEOUT ST (SAFETY) ×2 IMPLANT
DRAPE STERI IOBAN 125X83 (DRAPES) ×2 IMPLANT
DRAPE U-SHAPE 47X51 STRL (DRAPES) ×4 IMPLANT
DRSG AQUACEL AG ADV 3.5X10 (GAUZE/BANDAGES/DRESSINGS) ×2 IMPLANT
DURAPREP 26ML APPLICATOR (WOUND CARE) ×2 IMPLANT
ELECT REM PT RETURN 9FT ADLT (ELECTROSURGICAL) ×2
ELECTRODE REM PT RTRN 9FT ADLT (ELECTROSURGICAL) ×1 IMPLANT
GAUZE XEROFORM 1X8 LF (GAUZE/BANDAGES/DRESSINGS) ×2 IMPLANT
GLOVE BIO SURGEON STRL SZ7.5 (GLOVE) IMPLANT
GLOVE BIOGEL PI IND STRL 7.5 (GLOVE) ×1 IMPLANT
GLOVE BIOGEL PI IND STRL 8 (GLOVE) ×3 IMPLANT
GLOVE BIOGEL PI INDICATOR 7.5 (GLOVE) ×1
GLOVE BIOGEL PI INDICATOR 8 (GLOVE) ×3
GLOVE ECLIPSE 8.0 STRL XLNG CF (GLOVE) IMPLANT
GOWN STRL REUS W/TWL XL LVL3 (GOWN DISPOSABLE) ×4 IMPLANT
HANDPIECE INTERPULSE COAX TIP (DISPOSABLE) ×1
HOLDER FOLEY CATH W/STRAP (MISCELLANEOUS) ×2 IMPLANT
PACK ANTERIOR HIP CUSTOM (KITS) ×2 IMPLANT
RTRCTR WOUND ALEXIS 18CM MED (MISCELLANEOUS) ×2
SET HNDPC FAN SPRY TIP SCT (DISPOSABLE) ×1 IMPLANT
STAPLER VISISTAT 35W (STAPLE) ×2 IMPLANT
STRIP CLOSURE SKIN 1/2X4 (GAUZE/BANDAGES/DRESSINGS) IMPLANT
SUT ETHIBOND NAB CT1 #1 30IN (SUTURE) ×2 IMPLANT
SUT MNCRL AB 4-0 PS2 18 (SUTURE) IMPLANT
SUT VIC AB 0 CT1 36 (SUTURE) ×2 IMPLANT
SUT VIC AB 1 CT1 36 (SUTURE) ×4 IMPLANT
SUT VIC AB 2-0 CT1 27 (SUTURE) ×2
SUT VIC AB 2-0 CT1 TAPERPNT 27 (SUTURE) ×2 IMPLANT
SUT VIC AB 4-0 PS2 18 (SUTURE) ×2 IMPLANT
TRAY FOLEY W/METER SILVER 14FR (SET/KITS/TRAYS/PACK) IMPLANT
TRAY FOLEY W/METER SILVER 16FR (SET/KITS/TRAYS/PACK) IMPLANT

## 2015-09-09 NOTE — Progress Notes (Signed)
Dr. Council Mechanicenenny made aware of patient's blood pressures -orders given- Labetalol 10 mg IVP given as ordered.

## 2015-09-09 NOTE — Anesthesia Procedure Notes (Signed)
Procedure Name: Intubation Date/Time: 09/09/2015 7:31 AM Performed by: Edison PaceGRAY, Meade Hogeland E Pre-anesthesia Checklist: Patient identified, Suction available, Emergency Drugs available, Patient being monitored and Timeout performed Patient Re-evaluated:Patient Re-evaluated prior to inductionOxygen Delivery Method: Circle system utilized Preoxygenation: Pre-oxygenation with 100% oxygen Intubation Type: IV induction Laryngoscope Size: Mac and 3 Grade View: Grade II Tube type: Oral Tube size: 7.5 mm Number of attempts: 1 Airway Equipment and Method: Stylet Placement Confirmation: ETT inserted through vocal cords under direct vision,  positive ETCO2 and breath sounds checked- equal and bilateral Secured at: 21 cm Tube secured with: Tape Dental Injury: Teeth and Oropharynx as per pre-operative assessment

## 2015-09-09 NOTE — Progress Notes (Signed)
Blood pressure now 151/89

## 2015-09-09 NOTE — Progress Notes (Signed)
Labetalol 5 mg IVP given as ordered for elevated blood pressures

## 2015-09-09 NOTE — Brief Op Note (Signed)
09/09/2015  8:54 AM  PATIENT:  Seward Speckhelma M Berres  44 y.o. female  PRE-OPERATIVE DIAGNOSIS:  Severe osteoarthritis left hip  POST-OPERATIVE DIAGNOSIS:  Severe osteoarthritis left hip  PROCEDURE:  Procedure(s): LEFT TOTAL HIP ARTHROPLASTY ANTERIOR APPROACH (Left)  SURGEON:  Surgeon(s) and Role:    * Kathryne Hitchhristopher Y Blackman, MD - Primary  PHYSICIAN ASSISTANT: Rexene EdisonGil Clark, PA-C  ANESTHESIA:   general  EBL:  Total I/O In: 1000 [I.V.:1000] Out: 400 [Urine:150; Blood:250]  BLOOD ADMINISTERED:none  DRAINS: none   LOCAL MEDICATIONS USED:  NONE  SPECIMEN:  No Specimen  DISPOSITION OF SPECIMEN:  N/A  COUNTS:  YES  TOURNIQUET:  * No tourniquets in log *  DICTATION: .Other Dictation: Dictation Number [id number]  PLAN OF CARE: Admit to inpatient   PATIENT DISPOSITION:  PACU - hemodynamically stable.   Delay start of Pharmacological VTE agent (>24hrs) due to surgical blood loss or risk of bleeding: no

## 2015-09-09 NOTE — Progress Notes (Signed)
Dr. Council Mechanicenenny made aware of patient's blood pressures- orders given.

## 2015-09-09 NOTE — Progress Notes (Signed)
Dr. Magnus IvanBlackman stated patient had all the X-rays she needed done in the O.R.- none for PACU

## 2015-09-09 NOTE — Progress Notes (Signed)
Dr. Council Mechanicenenny in to see patient- O.K. To go to floor.

## 2015-09-09 NOTE — Anesthesia Postprocedure Evaluation (Signed)
Anesthesia Post Note  Patient: Marilyn [name]  Procedure(s) Performed: Procedure(s) (LRB): LEFT TOTAL HIP ARTHROPLASTY ANTERIOR APPROACH (Left)  Patient location during evaluation: PACU Anesthesia Type: General Level of consciousness: awake and alert Pain management: pain level controlled Vital Signs Assessment: post-procedure vital signs reviewed and stable Respiratory status: spontaneous breathing, nonlabored ventilation, respiratory function stable and patient connected to nasal cannula oxygen Cardiovascular status: blood pressure returned to baseline and stable Postop Assessment: no signs of nausea or vomiting Anesthetic complications: no Comments: To floor on OSA precautions.    Last Vitals:  Filed Vitals:   09/09/15 1215 09/09/15 1244  BP: 151/89 159/91  Pulse: 78 77  Temp: 36.3 C 36.7 C  Resp: 20 16    Last Pain:  Filed Vitals:   09/09/15 1257  PainSc: 3                  Jeremiyah Cullens J

## 2015-09-09 NOTE — Op Note (Signed)
Marilyn [name], Marilyn [name]             ACCOUNT NO.:  [id number]  MEDICAL RECORD NO.:  [id number]  LOCATION:                               FACILITY:  [facility]  PHYSICIAN:  Vanita Panda. Magnus Ivan, M.D.DATE OF BIRTH:  1971-05-03  DATE OF PROCEDURE:  09/09/2015 DATE OF DISCHARGE:                              OPERATIVE REPORT   PREOPERATIVE DIAGNOSES: 1. Primary osteoarthritis and degenerative joint disease, left hip. 2. Morbid obesity with a body mass index or BMI greater than 40.  POSTOPERATIVE DIAGNOSES: 1. Primary osteoarthritis and degenerative joint disease, left hip. 2. Morbid obesity with a body mass index or BMI greater than 40.  PROCEDURE:  Left total hip arthroplasty with a direct anterior approach.  IMPLANTS:  DePuy Sector Gription acetabular component size 48, size 32+ 0 neutral polyethylene liner, size 9 Corail femoral component with standard offset, size 32+ 1 ceramic hip ball.  SURGEON:  Doneen Poisson, MD  ASSISTING:  Richardean Canal, PA-C  ANESTHESIA:  General.  ANTIBIOTICS:  3 g IV Ancef.  BLOOD LOSS:  250 mL.  COMPLICATIONS:  None.  INDICATIONS:  Ms. Fryer is a 44 year old female with known end-stage arthritis, involving her left hip.  She is a morbidly obese individual, but a partner of mine was able to do bilateral knee replacements for her.  She has debilitating arthritis of her left hip.  Her pain is daily, it is affecting her mobility, her activities of daily living, and her quality of life.  I did see her in the office and examined her and felt that I could get to her hip through a direct anterior approach. She understands the limiting factors of this are mainly her obesity, and she understands her heightened risks of acute blood loss anemia, nerve, and vessel injury, fracture, infection, dislocation, DVT due to her obesity.  I explained all these things to her.  Her quality of life is miserable.  Her activities of daily living and mobility are  limited detrimentally.  She has x-rays that show complete loss of her joint space.  I again feel comfortable with this surgery and proceeding in light of her understanding the risks and benefits involved, and an informed consent has been obtained.  PROCEDURE DESCRIPTION:  After informed consent was obtained, appropriate left hip was marked.  She was brought to the operating room, and general anesthesia was obtained while she was on a stretcher.  A Foley catheter was placed.  Both feet had traction boots applied to them.  Next, she was placed supine on the Hana fracture table with a perineal post in place, and both legs in inline skeletal traction devices, but no traction applied.  Her left operative hip was then prepped and draped with DuraPrep and sterile drapes.  Time-out was called.  She was identified as the correct patient, correct left hip.  I then made an incision inferior and posterior to the anterior superior iliac spine and carried this obliquely down the leg.  I dissected down the tensor fascia lata muscle.  Tensor fascia was then divided longitudinally, so I could proceed with a direct anterior approach to the hip.  We identified and cauterized the lateral femoral circumflex vessels.  I then  identified the hip capsule.  I opened up the hip capsule in an L-type format, and basically performed a capsulectomy due to her size.  I was able to place Cobra retractors around the medial and lateral femoral neck and made our femoral neck cut with an oscillating saw proximal to the lesser trochanter and completed this with an osteotome.  We placed a corkscrew guide in the femoral head and removed the femoral head in its entirety and found it to be devoid of cartilage.  We then cleaned the acetabular rim and released the acetabular labrum and placed a bent Hohmann to the medial acetabular rim and released the transverse acetabular ligament. Then, began reaming under direct visualization  from a size 42 reamer only up to a size 48 with all reamers under direct visualization, and the last reamer under direct fluoroscopy, so we could obtain our depth of reaming, our inclination, and anteversion.  Once, I was pleased with this, I placed a real DePuy Sector Gription acetabular component size 48 without difficulty and a 32+ 0 neutral polyethylene liner for that sized acetabular component.  Attention was then turned to the femur.  With the leg externally rotated to 100 degrees, extended and adducted, we were able to place a Mueller retractor medially and a Hohmann retractor behind the greater trochanter.  We rolled the leg down and over and again with a Mueller retractor medially and a Hohmann retractor behind the greater trochanter, we were able to release the lateral joint capsule.  We then used a box cutting osteotome to enter the femoral canal and a rongeur to lateralize.  We were able to broach with just a size 8 and a size 9 broach.  Due to her thick cortical bone and her young age, I felt a size 9 fit nicely.  We trialed a 32+ 0 head and a standard neck and brought the leg back over and up with traction and internal rotation.  We were pleased with range of motion, stability, offset, and leg length.  We then dislocated the hip and removed the trial components.  We then placed the real Corail femoral component with standard offset, size 9 and a 32+ 1 ceramic hip ball and reduced it in the acetabulum.  Again, we were pleased with stability, leg lengths, and offset.  We then irrigated the soft tissues with normal saline solution using pulsatile lavage.  We were not able to close the joint capsule. We closed the tensor fascia with interrupted #1 Vicryl suture followed by 0 Vicryl in the deep tissue, 2-0 Vicryl in subcutaneous tissue, interrupted staples on the skin.  Xeroform and Aquacel dressing was applied.  She was taken off the Hana table, awakened, extubated, and taken to  the recovery room in stable condition.  All final counts were correct.  There were no complications noted.  Of note, Richardean CanalGilbert Clark, PA- C, assisted during the entire case.  His assistance was crucial for facilitating all aspects of this case.     Vanita Pandahristopher Y. Magnus IvanBlackman, M.D.     CYB/MEDQ  D:  09/09/2015  T:  09/09/2015  Job:  [id number]

## 2015-09-09 NOTE — Transfer of Care (Signed)
Immediate Anesthesia Transfer of Care Note  Patient: Marilyn [name]  Procedure(s) Performed: Procedure(s): LEFT TOTAL HIP ARTHROPLASTY ANTERIOR APPROACH (Left)  Patient Location: PACU  Anesthesia Type:General  Level of Consciousness: awake, oriented, patient cooperative, lethargic and responds to stimulation  Airway & Oxygen Therapy: Patient Spontanous Breathing and Patient connected to face mask oxygen  Post-op Assessment: Report given to RN, Post -op Vital signs reviewed and stable and Patient moving all extremities  Post vital signs: Reviewed and stable  Last Vitals:  Filed Vitals:   09/09/15 0523  BP: 168/98  Pulse: 85  Temp: 36.6 C  Resp: 18    Complications: No apparent anesthesia complications

## 2015-09-09 NOTE — Progress Notes (Signed)
Utilization review completed.  

## 2015-09-09 NOTE — H&P (Signed)
TOTAL HIP ADMISSION H&P  Patient is admitted for left total hip arthroplasty.  Subjective:  Chief Complaint: left hip pain  HPI: Marilyn [name], 44 y.o. female, has a history of pain and functional disability in the left hip(s) due to arthritis and patient has failed non-surgical conservative treatments for greater than 12 weeks to include NSAID's and/or analgesics, flexibility and strengthening excercises, use of assistive devices, weight reduction as appropriate and activity modification.  Onset of symptoms was gradual starting 2 years ago with rapidlly worsening course since that time.The patient noted no past surgery on the left hip(s).  Patient currently rates pain in the left hip at 10 out of 10 with activity. Patient has night pain, worsening of pain with activity and weight bearing, trendelenberg gait, pain that interfers with activities of daily living and pain with passive range of motion. Patient has evidence of subchondral sclerosis, periarticular osteophytes and joint space narrowing by imaging studies. This condition presents safety issues increasing the risk of falls.  There is no current active infection.  Patient Active Problem List   Diagnosis Date Noted  . Osteoarthritis of left hip 09/09/2015  . Obesity (BMI 35.0-39.9 without comorbidity) (HCC) 04/28/2013   Past Medical History  Diagnosis Date  . Hypertension   . Arthritis   . Asthma   . Hypothyroidism     takes levothyroxen  . Shortness of breath     with exertion  . Headache(784.0)     HX OF MIGRAINES  . OSA on CPAP   . Anginal pain (HCC)     pt states experiences chest wall pain pt states related to her asthma   . History of bronchitis   . Anxiety     with MRI's  . GERD (gastroesophageal reflux disease)   . Anemia     taking iron now. pt states having no current issues 09/02/2015  . Wears glasses   . Dizziness     Past Surgical History  Procedure Laterality Date  . Cesarean section      times 2  .  Cholecystectomy    . Knee arthroscopy    . Rotator cuff repair      left   . Joint replacement  2011    total left knee  . Tubal ligation  1996  . Knee arthroplasty  04/23/2012    right   . Total knee arthroplasty  04/23/2012    Procedure: TOTAL KNEE ARTHROPLASTY;  Surgeon: [name], MD;  Location: [facility] OR;  Service: Orthopedics;  Laterality: Right;  Right Total Knee Arthroplasty  . Endometrial ablation    . Shoulder surgery      right to repair ligament tear    Prescriptions prior to admission  Medication Sig Dispense Refill Last Dose  . albuterol (PROVENTIL HFA;VENTOLIN HFA) 108 (90 BASE) MCG/ACT inhaler Inhale 2 puffs into the lungs every 6 (six) hours as needed for wheezing or shortness of breath.   Past Month at Unknown time  . amLODipine (NORVASC) 5 MG tablet Take 5 mg by mouth daily.    09/09/2015 at 0345  . cetirizine (ZYRTEC) 10 MG tablet Take 10 mg by mouth daily as needed for allergies.   Past Month at Unknown time  . cyclobenzaprine (FLEXERIL) 10 MG tablet Take 10 mg by mouth 3 (three) times daily as needed for muscle spasms.   09/08/2015 at pm  . hydrochlorothiazide 25 MG tablet Take 25 mg by mouth daily.    09/08/2015 at am  . levothyroxine (  SYNTHROID, LEVOTHROID) 100 MCG tablet Take 100 mcg by mouth daily before breakfast.   09/09/2015 at 0345  . naproxen (NAPROSYN) 500 MG tablet Take 1 tablet (500 mg total) by mouth 2 (two) times daily. (Patient taking differently: Take 1,000 mg by mouth daily. ) 30 tablet 0 09/08/2015 at pm  . oxybutynin (DITROPAN-XL) 10 MG 24 hr tablet Take 10 mg by mouth daily.    09/08/2015 at am  . oxyCODONE-acetaminophen (PERCOCET) 10-325 MG tablet Take 2 tablets by mouth every 4 (four) hours as needed for pain.   09/09/2015 at 0345   Allergies  Allergen Reactions  . Lisinopril Anaphylaxis  . Bee Venom Swelling  . Codeine Other (See Comments)    Upset stomach  . Darvocet [Propoxyphene N-Acetaminophen] Hives    Tolerates tylenol  . Meloxicam      Diarrhea, insomnia, constipation  . Latex Rash  . Tomato Rash    Social History  Substance Use Topics  . Smoking status: Former Smoker -- 0.25 packs/day for 4 years    Types: Cigarettes    Quit date: 09/18/1991  . Smokeless tobacco: Never Used  . Alcohol Use: No    Family History  Problem Relation Age of Onset  . Cancer Mother     colon  . Epilepsy Mother   . Cancer Father     prostate  . Diabetes Father   . Hypertension Father   . Hypertension Maternal Aunt   . Diabetes Maternal Aunt   . Hypertension Paternal Aunt      Review of Systems  Musculoskeletal: Positive for joint pain.  All other systems reviewed and are negative.   Objective:  Physical Exam  Constitutional: She is oriented to person, place, and time. She appears well-developed and well-nourished.  HENT:  Head: Normocephalic and atraumatic.  Eyes: EOM are normal. Pupils are equal, round, and reactive to light.  Neck: Normal range of motion. Neck supple.  Cardiovascular: Normal rate and regular rhythm.   Respiratory: Effort normal.  GI: Soft. Bowel sounds are normal.  Musculoskeletal:       Left hip: She exhibits decreased range of motion, decreased strength, tenderness and bony tenderness.  Neurological: She is alert and oriented to person, place, and time.  Skin: Skin is warm and dry.    Vital signs in last 24 hours: Temp:  [97.9 F (36.6 C)] 97.9 F (36.6 C) (12/23 0523) Pulse Rate:  [85] 85 (12/23 0523) Resp:  [18] 18 (12/23 0523) BP: (168)/(98) 168/98 mmHg (12/23 0523) SpO2:  [99 %] 99 % (12/23 0523) Weight:  [143.535 kg (316 lb 7 oz)] 143.535 kg (316 lb 7 oz) (12/23 0553)  Labs:   Estimated body mass index is 56.07 kg/(m^2) as calculated from the following:   Height as of this encounter: 5\' 3"  (1.6 m).   Weight as of this encounter: 143.535 kg (316 lb 7 oz).   Imaging Review Plain radiographs demonstrate severe degenerative joint disease of the left hip(s). The bone quality  appears to be good for age and reported activity level.  Assessment/Plan:  End stage arthritis, left hip(s)  The patient history, physical examination, clinical judgement of the provider and imaging studies are consistent with end stage degenerative joint disease of the left hip(s) and total hip arthroplasty is deemed medically necessary. The treatment options including medical management, injection therapy, arthroscopy and arthroplasty were discussed at length. The risks and benefits of total hip arthroplasty were presented and reviewed. The risks due to aseptic loosening, infection, stiffness,  dislocation/subluxation,  thromboembolic complications and other imponderables were discussed.  The patient acknowledged the explanation, agreed to proceed with the plan and consent was signed. Patient is being admitted for inpatient treatment for surgery, pain control, PT, OT, prophylactic antibiotics, VTE prophylaxis, progressive ambulation and ADL's and discharge planning.The patient is planning to be discharged home with home health services

## 2015-09-10 LAB — BASIC METABOLIC PANEL
Anion gap: 10 (ref 5–15)
BUN: 15 mg/dL (ref 6–20)
CALCIUM: 8.6 mg/dL — AB (ref 8.9–10.3)
CO2: 24 mmol/L (ref 22–32)
Chloride: 103 mmol/L (ref 101–111)
Creatinine, Ser: 1.13 mg/dL — ABNORMAL HIGH (ref 0.44–1.00)
GFR calc Af Amer: >60 mL/min (ref >60)
GFR calc non Af Amer: 57 mL/min — ABNORMAL LOW (ref >60)
GLUCOSE: 141 mg/dL — AB (ref 65–99)
Potassium: 4.1 mmol/L (ref 3.5–5.1)
Sodium: 137 mmol/L (ref 135–145)

## 2015-09-10 LAB — CBC
HEMATOCRIT: 27.6 % — AB (ref 36.0–46.0)
Hemoglobin: 8.7 g/dL — ABNORMAL LOW (ref 12.0–15.0)
MCH: 26.8 pg (ref 26.0–34.0)
MCHC: 31.5 g/dL (ref 30.0–36.0)
MCV: 84.9 fL (ref 78.0–100.0)
Platelets: 310 10*3/uL (ref 150–400)
RBC: 3.25 MIL/uL — ABNORMAL LOW (ref 3.87–5.11)
RDW: 13.8 % (ref 11.5–15.5)
WBC: 9.2 10*3/uL (ref 4.0–10.5)

## 2015-09-10 NOTE — Evaluation (Signed)
Physical Therapy Evaluation Patient Details Name: Marilyn [name] MRN: [id number] DOB: 1971-05-03 Today's Date: 09/10/2015   History of Present Illness  L THR; s/p Bil TKR  Clinical Impression  Pt with good pain control and pleased with progress with mobility.    Follow Up Recommendations SNF    Equipment Recommendations  Rolling walker with 5" wheels    Recommendations for Other Services OT consult     Precautions / Restrictions Precautions Precautions: Fall Restrictions Weight Bearing Restrictions: No Other Position/Activity Restrictions: WBAT      Mobility  Bed Mobility               General bed mobility comments: Pt declines back to  bed  Transfers Overall transfer level: Needs assistance Equipment used: Rolling walker (2 wheeled) Transfers: Sit to/from Stand Sit to Stand: Min assist Stand pivot transfers: Min assist       General transfer comment: cues for LE management and use of UEs to self assist  Ambulation/Gait Ambulation/Gait assistance: Min assist;Min guard Ambulation Distance (Feet): 130 Feet (twice) Assistive device: Rolling walker (2 wheeled) Gait Pattern/deviations: Step-to pattern;Step-through pattern;Decreased step length - right;Decreased step length - left;Shuffle;Trunk flexed Gait velocity: decreased   General Gait Details: cues for posture, position from RW and initial sequence  Stairs            Wheelchair Mobility    Modified Rankin (Stroke Patients Only)       Balance                                             Pertinent Vitals/Pain Pain Assessment: 0-10 Pain Score: 3  Pain Location: L hip Pain Descriptors / Indicators: Aching;Sore Pain Intervention(s): Limited activity within patient's tolerance;Monitored during session;Premedicated before session;Ice applied    Home Living Family/patient expects to be discharged to:: Private residence Living Arrangements: Parent Available Help at  Discharge: Family Type of Home: House Home Access: Stairs to enter Entrance Stairs-Rails: Right Entrance Stairs-Number of Steps: 3 Home Layout: One level Home Equipment: Bedside commode;Cane - quad      Prior Function Level of Independence: Independent;Independent with assistive device(s)               Hand Dominance        Extremity/Trunk Assessment   Upper Extremity Assessment: Overall WFL for tasks assessed                     Communication   Communication: No difficulties  Cognition Arousal/Alertness: Awake/alert Behavior During Therapy: WFL for tasks assessed/performed Overall Cognitive Status: Within Functional Limits for tasks assessed                      General Comments      Exercises Total Joint Exercises Ankle Circles/Pumps: AROM;Both;15 reps;Supine      Assessment/Plan    PT Assessment    PT Diagnosis     PT Problem List    PT Treatment Interventions     PT Goals (Current goals can be found in the Care Plan section) Acute Rehab PT Goals Patient Stated Goal: Regain IND and ambulate with decreased pain PT Goal Formulation: With patient Time For Goal Achievement: 09/15/15 Potential to Achieve Goals: Good    Frequency 7X/week   Barriers to discharge        Co-evaluation  End of Session   Activity Tolerance: Patient tolerated treatment well Patient left: in chair;with call bell/phone within reach;with family/visitor present Nurse Communication: Mobility status         Time: 1340-1403 PT Time Calculation (min) (ACUTE ONLY): 23 min   Charges:     PT Treatments $Gait Training: 23-37 mins   PT G Codes:        Kyheem Bathgate 09/10/2015, 2:07 PM

## 2015-09-10 NOTE — Progress Notes (Signed)
Subjective: 1 Day Post-Op Procedure(s) (LRB): LEFT TOTAL HIP ARTHROPLASTY ANTERIOR APPROACH (Left) Patient reports pain as moderate.  Acute blood loss anemia from surgery, but well compensated.  Objective: Vital signs in last 24 hours: Temp:  [97.4 F (36.3 C)-98.8 F (37.1 C)] 98.4 F (36.9 C) (12/24 0557) Pulse Rate:  [65-92] 86 (12/24 0557) Resp:  [0-26] 16 (12/24 0557) BP: (136-190)/(71-131) 136/71 mmHg (12/24 0557) SpO2:  [38 %-100 %] 99 % (12/24 0557)  Intake/Output from previous day: 12/23 0701 - 12/24 0700 In: 3820 [P.O.:840; I.V.:2925; IV Piggyback:55] Out: 2600 [Urine:2350; Blood:250] Intake/Output this shift:     Recent Labs  09/10/15 0506  HGB 8.7*    Recent Labs  09/10/15 0506  WBC 9.2  RBC 3.25*  HCT 27.6*  PLT 310    Recent Labs  09/10/15 0506  NA 137  K 4.1  CL 103  CO2 24  BUN 15  CREATININE 1.13*  GLUCOSE 141*  CALCIUM 8.6*   No results for input(s): LABPT, INR in the last 72 hours.  Sensation intact distally Intact pulses distally Dorsiflexion/Plantar flexion intact Incision: scant drainage  Assessment/Plan: 1 Day Post-Op Procedure(s) (LRB): LEFT TOTAL HIP ARTHROPLASTY ANTERIOR APPROACH (Left) Up with therapy - WBAT left hip May need short-term SNF placement due to minimal support at home  [facility]BLACKMAN,Edmond Ginsberg Y 09/10/2015, 7:50 AM

## 2015-09-10 NOTE — Progress Notes (Signed)
Foley discontinued, pt due to void by 12:45p.

## 2015-09-10 NOTE — NC FL2 (Signed)
[place] MEDICAID FL2 LEVEL OF CARE SCREENING TOOL     IDENTIFICATION  Patient Name: Marilyn [name] Birthdate: [birth year] Sex: female Admission Date (Current Location): 09/09/2015  [facility] [place]   Facility and Address:  [facility],  [address], [place] [id number]      Provider Number: [id number]  Attending Physician Name and Address:  Kathryne Hitchhristopher Y Blackman, *  Relative Name and Phone Number:       Current Level of Care: Hospital Recommended Level of Care: Skilled Nursing Facility Prior Approval Number:    Date Approved/Denied:   PASRR Number: [id number]  Discharge Plan: SNF    Current Diagnoses: Patient Active Problem List   Diagnosis Date Noted  . Osteoarthritis of left hip 09/09/2015  . 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 Obesity (BMI 35.0-39.9 without comorbidity) (HCC) 04/28/2013    Orientation RESPIRATION BLADDER Height & Weight    Self, Time, Situation, Place  Normal Continent      BEHAVIORAL SYMPTOMS/MOOD NEUROLOGICAL BOWEL NUTRITION STATUS  Other (Comment) (no behaviors)   Continent Diet  AMBULATORY STATUS COMMUNICATION OF NEEDS Skin   Limited Assist Verbally Surgical wounds                       Personal Care Assistance Level of Assistance  Bathing, Feeding, Dressing Bathing Assistance: Limited assistance Feeding assistance: Independent Dressing Assistance: Limited assistance     Functional Limitations Info  Sight, Hearing, Speech Sight Info: Adequate Hearing Info: Adequate Speech Info: Adequate    SPECIAL CARE FACTORS FREQUENCY  PT (By licensed PT), OT (By licensed OT)     PT Frequency: 5 x wk OT Frequency: 5 x wk            Contractures Contractures Info: Not present    Additional Factors Info  Code Status Code Status Info: Full             Current Medications (09/10/2015):  This is the current hospital active medication list Current  Facility-Administered Medications  Medication Dose Route Frequency Provider Last Rate Last Dose  . 0.9 %  sodium chloride infusion   Intravenous Continuous Kathryne Hitchhristopher Y Blackman, MD 75 mL/hr at 09/10/15 0534    . albuterol (PROVENTIL) (2.5 MG/3ML) 0.083% nebulizer solution 3 mL  3 mL Inhalation Q6H PRN Kathryne Hitchhristopher Y Blackman, MD      . alum & mag hydroxide-simeth (MAALOX/MYLANTA) 200-200-20 MG/5ML suspension 30 mL  30 mL Oral Q4H PRN Kathryne Hitchhristopher Y Blackman, MD      . amLODipine (NORVASC) tablet 5 mg  5 mg Oral Daily Kathryne Hitchhristopher Y Blackman, MD   5 mg at 09/10/15 0956  . bisacodyl (DULCOLAX) suppository 10 mg  10 mg Rectal Daily PRN Kathryne Hitchhristopher Y Blackman, MD      . diphenhydrAMINE (BENADRYL) 12.5 MG/5ML elixir 12.5-25 mg  12.5-25 mg Oral Q4H PRN Kathryne Hitchhristopher Y Blackman, MD      . docusate sodium (COLACE) capsule 100 mg  100 mg Oral BID Kathryne Hitchhristopher Y Blackman, MD   100 mg at 09/10/15 0956  . hydrochlorothiazide (HYDRODIURIL) tablet 25 mg  25 mg Oral Daily Kathryne Hitchhristopher Y Blackman, MD   25 mg at 09/10/15 0956  . HYDROmorphone (DILAUDID) injection 0.5 mg  0.5 mg Intravenous Q2H PRN Kathryne Hitchhristopher Y Blackman, MD      . levothyroxine (SYNTHROID, LEVOTHROID) tablet 100 mcg  100 mcg Oral QAC breakfast Kathryne Hitchhristopher Y Blackman, MD   100 mcg at 09/10/15 [phone]  .  menthol-cetylpyridinium (CEPACOL) lozenge 3 mg  1 lozenge Oral PRN Kathryne Hitch, MD       Or  . phenol (CHLORASEPTIC) mouth spray 1 spray  1 spray Mouth/Throat PRN Kathryne Hitch, MD      . methocarbamol (ROBAXIN) tablet 500 mg  500 mg Oral Q6H PRN Kathryne Hitch, MD   500 mg at 09/10/15 1478   Or  . methocarbamol (ROBAXIN) 500 mg in dextrose 5 % 50 mL IVPB  500 mg Intravenous Q6H PRN Kathryne Hitch, MD   500 mg at 09/09/15 1002  . metoCLOPramide (REGLAN) tablet 5-10 mg  5-10 mg Oral Q8H PRN Kathryne Hitch, MD       Or  . metoCLOPramide (REGLAN) injection 5-10 mg  5-10 mg Intravenous Q8H PRN Kathryne Hitch,  MD      . ondansetron [facility]) tablet 4 mg  4 mg Oral Q6H PRN Kathryne Hitch, MD       Or  . ondansetron [facility]) injection 4 mg  4 mg Intravenous Q6H PRN Kathryne Hitch, MD      . oxybutynin (DITROPAN-XL) 24 hr tablet 10 mg  10 mg Oral Daily Kathryne Hitch, MD   10 mg at 09/10/15 0956  . oxyCODONE (Oxy IR/ROXICODONE) immediate release tablet 5-15 mg  5-15 mg Oral Q3H PRN Kathryne Hitch, MD   10 mg at 09/10/15 0949  . rivaroxaban (XARELTO) tablet 10 mg  10 mg Oral Q breakfast Kathryne Hitch, MD   10 mg at 09/10/15 2956  . zolpidem (AMBIEN) tablet 5 mg  5 mg Oral QHS PRN Kathryne Hitch, MD         Discharge Medications: Please see discharge summary for a list of discharge medications.  Relevant Imaging Results:  Relevant Lab Results:   Additional Information SS # [id number]. Pt uses CPAP at night.  Janace Decker, Dickey Gave, LCSW

## 2015-09-10 NOTE — Clinical Social Work Note (Signed)
Clinical Social Work Assessment  Patient Details  Name: Marilyn [name] MRN: [id number] Date of Birth: 30-Apr-1971  Date of referral:  09/10/15               Reason for consult:  Facility Placement, Discharge Planning                Permission sought to share information with:  Chartered certified accountant granted to share information::  Yes, Verbal Permission Granted  Name::        Agency::     Relationship::     Contact Information:     Housing/Transportation Living arrangements for the past 2 months:  Single Family Home Source of Information:  Patient Patient Interpreter Needed:  None Criminal Activity/Legal Involvement Pertinent to Current Situation/Hospitalization:  No - Comment as needed Significant Relationships:  Adult Children Lives with:    Do you feel safe going back to the place where you live?   (ST Rehab needed.) Need for family participation in patient care:  No (Coment)  Care giving concerns:  Pt's care cannot be managed at home following hospital d/c.   Social Worker assessment / plan: Pt hospitalized on 09/09/15 for pre planned left total hip arthroplasty. CSW met with pt to assist with d/c planning. ST Rehab will be needed at d/c. Pt has requested placement in [facility]. SNF search initiated and bed offers pending. CSW will continue to follow to assist with d/c planning to SNF. Employment status:  Unemployed Forensic scientist:  Medicaid In [place], [place] PT Recommendations:  [place] / Referral to community resources:  [place]  Patient/Family's Response to care:  Pt feels ST Rehab is needed.   Patient/Family's Understanding of and Emotional Response to Diagnosis, Current Treatment, and Prognosis: Pt is aware of her medical status and is motivated to work with therapy.  Emotional Assessment Appearance:  Appears stated age Attitude/Demeanor/Rapport:  Other (cooperative) Affect (typically  observed):  Calm, Pleasant Orientation:    Alcohol / Substance use:  Not Applicable Psych involvement (Current and /or in the community):  No (Comment)  Discharge Needs  Concerns to be addressed:  Discharge Planning Concerns Readmission within the last 30 days:  No Current discharge risk:  None Barriers to Discharge:  No Barriers Identified   Loraine Maple  [phone] 09/10/2015, 12:21 PM

## 2015-09-10 NOTE — Evaluation (Signed)
Physical Therapy Evaluation Patient Details Name: Marilyn [name] MRN: [id number] DOB: 1971-03-22 Today's Date: 09/10/2015   History of Present Illness  L THR; s/p Bil THR  Clinical Impression  Pt s/p L THR presents with decreased L LE strength/ROM, post op pain, and obesity limiting functional mobility.  Pt would benefit from follow up rehab at SNF level to maximize IND and safety prior to return home with ltd assist.    Follow Up Recommendations SNF    Equipment Recommendations  Rolling walker with 5" wheels (wide)    Recommendations for Other Services OT consult     Precautions / Restrictions Precautions Precautions: Fall Restrictions Weight Bearing Restrictions: No Other Position/Activity Restrictions: WBAT      Mobility  Bed Mobility Overal bed mobility: Needs Assistance Bed Mobility: Supine to Sit     Supine to sit: Min assist;Mod assist     General bed mobility comments: cues for sequence and use of R LE to self assist.  HOB raised and pt utilizing hand rails  Transfers Overall transfer level: Needs assistance Equipment used: Rolling walker (2 wheeled) Transfers: Sit to/from Stand Sit to Stand: Min assist;From elevated surface         General transfer comment: cues for LE management and use of UEs to self assist  Ambulation/Gait Ambulation/Gait assistance: Min assist Ambulation Distance (Feet): 130 Feet Assistive device: Rolling walker (2 wheeled) Gait Pattern/deviations: Step-to pattern;Step-through pattern;Decreased step length - right;Decreased step length - left;Shuffle;Trunk flexed Gait velocity: decreased   General Gait Details: cues for posture, position from RW and initial sequence  Stairs            Wheelchair Mobility    Modified Rankin (Stroke Patients Only)       Balance                                             Pertinent Vitals/Pain Pain Assessment: 0-10 Pain Score: 3  Pain Location: L hip Pain  Descriptors / Indicators: Aching;Sore Pain Intervention(s): Limited activity within patient's tolerance;Monitored during session;Premedicated before session;Ice applied    Home Living Family/patient expects to be discharged to:: Private residence Living Arrangements: Parent Available Help at Discharge: Family Type of Home: House Home Access: Stairs to enter Entrance Stairs-Rails: Right Entrance Stairs-Number of Steps: 3 Home Layout: One level Home Equipment: Bedside commode;Cane - quad      Prior Function Level of Independence: Independent;Independent with assistive device(s)               Hand Dominance        Extremity/Trunk Assessment   Upper Extremity Assessment: Overall WFL for tasks assessed           Lower Extremity Assessment: LLE deficits/detail   LLE Deficits / Details: AAROM at hip to 70 flex and 20 abd with 2+/5 strength at hip  Cervical / Trunk Assessment: Normal  Communication   Communication: No difficulties  Cognition Arousal/Alertness: Awake/alert Behavior During Therapy: WFL for tasks assessed/performed Overall Cognitive Status: Within Functional Limits for tasks assessed                      General Comments      Exercises Total Joint Exercises Ankle Circles/Pumps: AROM;Both;15 reps;Supine Heel Slides: AAROM;Left;15 reps;Supine Hip ABduction/ADduction: AAROM;Left;10 reps;Supine      Assessment/Plan    PT Assessment Patient needs continued PT services  PT Diagnosis Difficulty walking   PT Problem List Decreased strength;Decreased range of motion;Decreased activity tolerance;Decreased mobility;Decreased knowledge of use of DME;Obesity;Pain  PT Treatment Interventions DME instruction;Gait training;Stair training;Functional mobility training;Therapeutic activities;Therapeutic exercise;Patient/family education   PT Goals (Current goals can be found in the Care Plan section) Acute Rehab PT Goals Patient Stated Goal: Regain IND  and ambulate with decreased pain PT Goal Formulation: With patient Time For Goal Achievement: 09/15/15 Potential to Achieve Goals: Good    Frequency 7X/week   Barriers to discharge Decreased caregiver support Pt resides with mother who has significant health issues    Co-evaluation               End of Session Equipment Utilized During Treatment: Gait belt Activity Tolerance: Patient tolerated treatment well Patient left: in chair;with call bell/phone within reach Nurse Communication: Mobility status         Time: 1005-1036 PT Time Calculation (min) (ACUTE ONLY): 31 min   Charges:   PT Evaluation $Initial PT Evaluation Tier I: 1 Procedure PT Treatments $Gait Training: 8-22 mins   PT G Codes:        Artavious Trebilcock 10/04/15, 12:45 PM

## 2015-09-10 NOTE — Evaluation (Signed)
Occupational Therapy Evaluation Patient Details Name: Donley Redderhelma M Messman MRN: [id number] DOB: 07-Aug-1971 Today's Date: 09/10/2015    History of Present Illness L THR; s/p Bil TKR   Clinical Impression   Pt is s/p THA resulting in the deficits listed below (see OT Problem List).  Pt will benefit from skilled OT to increase their safety and independence with ADL and functional mobility for ADL to facilitate discharge to venue listed below.       Follow Up Recommendations  Home health OT;SNF (depending on progress)    Equipment Recommendations  None recommended by OT       Precautions / Restrictions Precautions Precautions: Fall Restrictions Weight Bearing Restrictions: No Other Position/Activity Restrictions: WBAT      Mobility Bed Mobility Overal bed mobility: Needs Assistance Bed Mobility: Supine to Sit     Supine to sit: Min assist;Mod assist     General bed mobility comments: pt in chair  Transfers Overall transfer level: Needs assistance Equipment used: Rolling walker (2 wheeled) Transfers: Sit to/from UGI CorporationStand;Stand Pivot Transfers Sit to Stand: Min assist Stand pivot transfers: Min assist       General transfer comment: cues for LE management and use of UEs to self assist         ADL Overall ADL's : Needs assistance/impaired                     Lower Body Dressing: Sit to/from stand;Moderate assistance Lower Body Dressing Details (indicate cue type and reason): pt may benefit from AE Toilet Transfer: Minimal assistance;BSC;Cueing for safety;RW   Toileting- Clothing Manipulation and Hygiene: Minimal assistance;Sit to/from stand         General ADL Comments: pt performed sit to stand x3  with minA. Pt will benefit from AE/ Instructed on where to obtain. will follow up next OT session               Pertinent Vitals/Pain Pain Assessment: 0-10 Pain Score: 3  Pain Location: L hip Pain Descriptors / Indicators: Sore Pain  Intervention(s): Monitored during session        Extremity/Trunk Assessment Upper Extremity Assessment Upper Extremity Assessment: Overall WFL for tasks assessed     Cervical / Trunk Assessment Cervical / Trunk Assessment: Normal   Communication Communication Communication: No difficulties   Cognition Arousal/Alertness: Awake/alert Behavior During Therapy: WFL for tasks assessed/performed Overall Cognitive Status: Within Functional Limits for tasks assessed                        Exercises Exercises: Total Joint          Home Living Family/patient expects to be discharged to:: Private residence Living Arrangements: Parent Available Help at Discharge: Family Type of Home: House Home Access: Stairs to enter Secretary/administratorntrance Stairs-Number of Steps: 3 Entrance Stairs-Rails: Right Home Layout: One level     Bathroom Shower/Tub: Chief Strategy OfficerTub/shower unit   Bathroom Toilet: Handicapped height     Home Equipment: Bedside commode;Cane - quad          Prior Functioning/Environment Level of Independence: Independent;Independent with assistive device(s)             OT Diagnosis: Acute pain;Generalized weakness   OT Problem List: Decreased strength;Pain   OT Treatment/Interventions: Self-care/ADL training;DME and/or AE instruction;Patient/family education    OT Goals(Current goals can be found in the care plan section) Acute Rehab OT Goals Patient Stated Goal: Regain IND and ambulate with decreased pain OT Goal Formulation: With  patient Time For Goal Achievement: 09/24/15 Potential to Achieve Goals: Good  OT Frequency: Min 2X/week   Barriers to D/C: Decreased caregiver support          Co-evaluation              End of Session Equipment Utilized During Treatment: Rolling walker Nurse Communication: Mobility status  Activity Tolerance: Patient tolerated treatment well Patient left: in chair;with call bell/phone within reach   Time: 1241-1300 OT Time  Calculation (min): 19 min Charges:  OT General Charges $OT Visit: 1 Procedure OT Evaluation $Initial OT Evaluation Tier I: 1 Procedure G-Codes:    Einar Crow D 09/13/15, 1:15 PM

## 2015-09-10 NOTE — Clinical Social Work Placement (Signed)
   CLINICAL SOCIAL WORK PLACEMENT  NOTE  Date:  09/10/2015  Patient Details  Name: Donley Redderhelma M Vanderhoof MRN: [id number] Date of Birth: 11/25/1970  Clinical Social Work is seeking post-discharge placement for this patient at the Skilled  Nursing Facility level of care (*CSW will initial, date and re-position this form in  chart as items are completed):  Yes   Patient/family provided with [facility] Clinical Social Work Department's list of facilities offering this level of care within the geographic area requested by the patient (or if unable, by the patient's family).  Yes   Patient/family informed of their freedom to choose among providers that offer the needed level of care, that participate in Medicare, Medicaid or managed care program needed by the patient, have an available bed and are willing to accept the patient.  Yes   Patient/family informed of [facility]'s ownership interest in [facility] and [facility], as well as of the fact that they are under no obligation to receive care at these facilities.  PASRR submitted to EDS on 09/10/15     PASRR number received on 09/10/15     Existing PASRR number confirmed on       FL2 transmitted to all facilities in geographic area requested by pt/family on 09/10/15     FL2 transmitted to all facilities within larger geographic area on       Patient informed that his/her managed care company has contracts with or will negotiate with certain facilities, including the following:            Patient/family informed of bed offers received.  Patient chooses bed at       Physician recommends and patient chooses bed at      Patient to be transferred to   on  .  Patient to be transferred to facility by       Patient family notified on   of transfer.  Name of family member notified:        PHYSICIAN       Additional Comment:    _______________________________________________ Royetta AsalHaidinger, Ayren Zumbro Lee, Alexander MtLCSW   [phone] 09/10/2015, 12:27 PM

## 2015-09-11 LAB — CBC
HCT: 26.6 % — ABNORMAL LOW (ref 36.0–46.0)
Hemoglobin: 8.3 g/dL — ABNORMAL LOW (ref 12.0–15.0)
MCH: 26.8 pg (ref 26.0–34.0)
MCHC: 31.2 g/dL (ref 30.0–36.0)
MCV: 85.8 fL (ref 78.0–100.0)
PLATELETS: 299 10*3/uL (ref 150–400)
RBC: 3.1 MIL/uL — AB (ref 3.87–5.11)
RDW: 14.2 % (ref 11.5–15.5)
WBC: 9.2 10*3/uL (ref 4.0–10.5)

## 2015-09-11 LAB — TYPE AND SCREEN
ABO/RH(D): O POS
Antibody Screen: NEGATIVE

## 2015-09-11 MED ORDER — POLYETHYLENE GLYCOL 3350 17 G PO PACK
17.0000 g | PACK | Freq: Every day | ORAL | Status: DC | PRN
  Administered 2015-09-11 – 2015-09-12 (×2): 17 g via ORAL
  Filled 2015-09-11 (×2): qty 1

## 2015-09-11 NOTE — Progress Notes (Signed)
Dr. Roda ShuttersXu aware via phone pt requested laxative. See new order received.

## 2015-09-11 NOTE — Progress Notes (Signed)
Subjective: 2 Days Post-Op Procedure(s) (LRB): LEFT TOTAL HIP ARTHROPLASTY ANTERIOR APPROACH (Left) Patient reports pain as moderate.  Still asymptomatic from acute blood loss anemia.  Working well with therapy.  Objective: Vital signs in last 24 hours: Temp:  [97.9 F (36.6 C)-98.6 F (37 C)] 98.6 F (37 C) (12/25 0355) Pulse Rate:  [93-94] 93 (12/25 0355) Resp:  [16] 16 (12/25 0355) BP: (115-119)/(70-73) 115/73 mmHg (12/25 0355) SpO2:  [99 %-100 %] 100 % (12/25 0355)  Intake/Output from previous day: 12/24 0701 - 12/25 0700 In: 1896.3 [P.O.:720; I.V.:1176.3] Out: -  Intake/Output this shift: Total I/O In: 240 [P.O.:240] Out: -    Recent Labs  09/10/15 0506 09/11/15 0524  HGB 8.7* 8.3*    Recent Labs  09/10/15 0506 09/11/15 0524  WBC 9.2 9.2  RBC 3.25* 3.10*  HCT 27.6* 26.6*  PLT 310 299    Recent Labs  09/10/15 0506  NA 137  K 4.1  CL 103  CO2 24  BUN 15  CREATININE 1.13*  GLUCOSE 141*  CALCIUM 8.6*   No results for input(s): LABPT, INR in the last 72 hours.  Sensation intact distally Intact pulses distally Dorsiflexion/Plantar flexion intact Incision: scant drainage  Assessment/Plan: 2 Days Post-Op Procedure(s) (LRB): LEFT TOTAL HIP ARTHROPLASTY ANTERIOR APPROACH (Left) Up with therapy Discharge to SNF next 1-2 days. Will change dressing prior to discharge.  Marilyn [name] Y 09/11/2015, 10:45 AM

## 2015-09-11 NOTE — Progress Notes (Signed)
Physical Therapy Treatment Patient Details Name: Marilyn [name] MRN: [id number] DOB: 1970/10/21 Today's Date: 09/11/2015    History of Present Illness L THR; s/p Bil TKR    PT Comments    Pt motivated and progressing well with mobility.  Follow Up Recommendations  SNF     Equipment Recommendations  Rolling walker with 5" wheels    Recommendations for Other Services OT consult     Precautions / Restrictions Precautions Precautions: Fall Restrictions Weight Bearing Restrictions: No Other Position/Activity Restrictions: WBAT    Mobility  Bed Mobility Overal bed mobility: Needs Assistance Bed Mobility: Supine to Sit     Supine to sit: Min assist     General bed mobility comments: cues for sequence and use or R LE to self assist  Transfers Overall transfer level: Needs assistance Equipment used: Rolling walker (2 wheeled) Transfers: Sit to/from Stand Sit to Stand: Min guard         General transfer comment: cues for LE management and use of UEs to self assist  Ambulation/Gait Ambulation/Gait assistance: Min assist;Min guard Ambulation Distance (Feet): 100 Feet (and 50' and 20' to bathroom) Assistive device: Rolling walker (2 wheeled) Gait Pattern/deviations: Step-to pattern;Step-through pattern;Decreased step length - right;Decreased step length - left;Shuffle;Trunk flexed Gait velocity: decreased   General Gait Details: cues for posture, position from RW and initial sequence   Stairs            Wheelchair Mobility    Modified Rankin (Stroke Patients Only)       Balance                                    Cognition Arousal/Alertness: Awake/alert Behavior During Therapy: WFL for tasks assessed/performed Overall Cognitive Status: Within Functional Limits for tasks assessed                      Exercises Total Joint Exercises Ankle Circles/Pumps: AROM;Both;15 reps;Supine Quad Sets: AROM;Both;10  reps;Supine Gluteal Sets: AROM;Both;10 reps;Supine Heel Slides: AAROM;Left;Supine;20 reps Hip ABduction/ADduction: AAROM;Left;Supine;20 reps    General Comments        Pertinent Vitals/Pain Pain Assessment: 0-10 Pain Score: 5  Pain Location: L hip/thigh Pain Descriptors / Indicators: Aching;Burning Pain Intervention(s): Limited activity within patient's tolerance;Monitored during session;Premedicated before session;Ice applied    Home Living                      Prior Function            PT Goals (current goals can now be found in the care plan section) Acute Rehab PT Goals Patient Stated Goal: Regain IND and ambulate with decreased pain PT Goal Formulation: With patient Time For Goal Achievement: 09/15/15 Potential to Achieve Goals: Good Progress towards PT goals: Progressing toward goals    Frequency  7X/week    PT Plan Current plan remains appropriate    Co-evaluation             End of Session   Activity Tolerance: Patient tolerated treatment well Patient left: in chair;with call bell/phone within reach     Time: 0807-0850 PT Time Calculation (min) (ACUTE ONLY): 43 min  Charges:  $Gait Training: 23-37 mins $Therapeutic Exercise: 8-22 mins                    G Codes:      Damar Petit 09/11/2015, 8:58 AM

## 2015-09-12 MED ORDER — CYCLOBENZAPRINE HCL 10 MG PO TABS: 10.0000 mg | ORAL_TABLET | Freq: Three times a day (TID) | ORAL | Status: DC | PRN

## 2015-09-12 MED ORDER — OXYCODONE HCL 5 MG PO TABS: 5.0000 mg | ORAL_TABLET | ORAL | Status: DC | PRN

## 2015-09-12 MED ORDER — RIVAROXABAN 10 MG PO TABS: 10.0000 mg | ORAL_TABLET | Freq: Every day | ORAL | Status: DC

## 2015-09-12 NOTE — Progress Notes (Signed)
CSW continuing to follow.   CSW received report that pt needing short [facility] rehab [facility] [facility]  [facility] and facility confirmed that they could accept pt today.   CSW discussed with pt and pt son and both are in agreement [facility].  CSW facilitated pt discharge needs including contacting facility, faxing pt discharge information via [employer], discussing with pt and pt son at bedside, providing RN phone number to call report, and providing discharge packet to pt to [facility] to [facility].  [facility]   No further social work needs identified at this time.  CSW signing off.   Loletta SpecterSuzanna Manning Luna, MSW, LCSW Clinical Social Work Coverage for Hum[employer] IncJamie Haidinger, [place]LCSW [phone]

## 2015-09-12 NOTE — Progress Notes (Signed)
Patient alert and oriented, pain controlled. Report called to SNF discharge paperwork given to patient to give to facility. Patient left via private vehicle.

## 2015-09-12 NOTE — Care Management Note (Signed)
Case Management Note  Patient Details  Name: Marilyn [name] MRN: [id number] Date of Birth: 10-Nov-1970  Subjective/Objective:    S/p Left total hip arthroplasty             Action/Plan: Discharge planning per CSW  Expected Discharge Date:                  Expected Discharge Plan:  Skilled Nursing Facility  In-House Referral:  Clinical Social Work  Discharge planning Services  CM Consult  Post Acute Care Choice:  NA Choice offered to:  NA  DME Arranged:  N/A DME Agency:  NA  HH Arranged:  NA HH Agency:  NA  Status of Service:  Completed, signed off  Medicare Important Message Given:    Date Medicare IM Given:    Medicare IM give by:    Date Additional Medicare IM Given:    Additional Medicare Important Message give by:     If discussed at Long Length of Stay Meetings, dates discussed:    Additional Comments:  Alexis Goodelleele, Tausha Milhoan K, RN 09/12/2015, 11:00 AM

## 2015-09-12 NOTE — Progress Notes (Signed)
Physical Therapy Treatment Patient Details Name: Marilyn [name] Nemmers MRN: [id number] DOB: [birth year] Today's Date: 09/12/2015    History of Present Illness L THR; s/p Bil TKR    PT Comments    POD # 3 Assisted out of recliner to amb top BR then in hallway.  Slow but steady gait.  Pt will need ST Rehab at SNF prior to D/C to home.   Follow Up Recommendations  SNF     Equipment Recommendations       Recommendations for Other Services       Precautions / Restrictions Precautions Precautions: Fall Restrictions Weight Bearing Restrictions: No LLE Weight Bearing: Weight bearing as tolerated    Mobility  Bed Mobility   Bed Mobility: Supine to Sit     Supine to sit: Supervision     General bed mobility comments: Pt OOB in recliner  Transfers Overall transfer level: Needs assistance Equipment used: Rolling walker (2 wheeled) Transfers: Sit to/from Stand Sit to Stand: Min guard Stand pivot transfers: Min guard       General transfer comment: cues for LE management and use of UEs to self assist plus increased time esp off toilet  Ambulation/Gait Ambulation/Gait assistance: Min guard;Min assist Ambulation Distance (Feet): 75 Feet Assistive device: Rolling walker (2 wheeled) Gait Pattern/deviations: Step-to pattern;Step-through pattern Gait velocity: decreased   General Gait Details: decreased amb distance due to increase c/o fatigue   Stairs            Wheelchair Mobility    Modified Rankin (Stroke Patients Only)       Balance                                    Cognition Arousal/Alertness: Awake/alert Behavior During Therapy: WFL for tasks assessed/performed Overall Cognitive Status: Within Functional Limits for tasks assessed                      Exercises      General Comments        Pertinent Vitals/Pain Pain Assessment: 0-10 Pain Score: 5  Pain Location: L hip Pain Descriptors / Indicators: Sore;Tender Pain  Intervention(s): Monitored during session;Repositioned;Ice applied    Home Living                      Prior Function            PT Goals (current goals can now be found in the care plan section) Progress towards PT goals: Progressing toward goals    Frequency  7X/week    PT Plan Current plan remains appropriate    Co-evaluation             End of Session Equipment Utilized During Treatment: Gait belt Activity Tolerance: Patient tolerated treatment well Patient left: in chair;with call bell/phone within reach     Time: 1115-1145 PT Time Calculation (min) (ACUTE ONLY): 30 min  Charges:  $Gait Training: 8-22 mins $Therapeutic Activity: 8-22 mins                    G Codes:      Felecia ShellingLori Roxene Alviar  PTA [facility]  Acute  Rehab Pager      [phone]

## 2015-09-12 NOTE — Progress Notes (Signed)
Occupational Therapy Treatment Patient Details Name: Marilyn [name] MRN: [id number] DOB: [birth year] Today's Date: 09/12/2015    History of present illness L THR; s/p Bil TKR   OT comments  Pt wants SNF  Follow Up Recommendations  SNF (pt wants SNF as her mother not able to A)    Equipment Recommendations  None recommended by OT    Recommendations for Other Services      Precautions / Restrictions Precautions Precautions: Fall       Mobility Bed Mobility   Bed Mobility: Supine to Sit     Supine to sit: Supervision        Transfers Overall transfer level: Needs assistance Equipment used: Rolling walker (2 wheeled) Transfers: Sit to/from Stand Sit to Stand: Min guard Stand pivot transfers: Min guard                ADL                       Lower Body Dressing: Minimal assistance;Sit to/from stand Lower Body Dressing Details (indicate cue type and reason): pt will need AE. she is aware Toilet Transfer: Supervision/safety;RW;Comfort height toilet   Toileting- Clothing Manipulation and Hygiene: Supervision/safety;Sit to/from stand                Vision                            Cognition   Behavior During Therapy: [facility] System[facility]FL for tasks assessed/performed Overall Cognitive Status: Within Functional Limits for tasks assessed                               General Comments      Pertinent Vitals/ Pain       Pain Score: 2  Pain Location: L hip Pain Descriptors / Indicators: Sore Pain Intervention(s): Monitored during session;Repositioned         Frequency       Progress Toward Goals  OT Goals(current goals can now be found in the care plan section)  Progress towards OT goals: Progressing toward goals     Plan Discharge plan needs to be updated    Co-evaluation                 End of Session Equipment Utilized During Treatment: Rolling walker   Activity Tolerance Patient tolerated treatment well   Patient Left in chair;with call bell/phone within reach;with family/visitor present   Nurse Communication Mobility status        Time: 1000-1025 OT Time Calculation (min): 25 min  Charges: OT General Charges $OT Visit: 1 Procedure OT Treatments $Self Care/Home Management : 23-37 mins  Immaculate Crutcher, Metro KungLorraine D 09/12/2015, 10:29 AM

## 2015-09-12 NOTE — Progress Notes (Signed)
Subjective: 3 Days Post-Op Procedure(s) (LRB): LEFT TOTAL HIP ARTHROPLASTY ANTERIOR APPROACH (Left) Patient reports pain as moderate.    Objective: Vital signs in last 24 hours: Temp:  [97.7 F (36.5 C)-98.7 F (37.1 C)] 98.2 F (36.8 C) (12/26 0551) Pulse Rate:  [63-95] 89 (12/26 0551) Resp:  [16-18] 16 (12/26 0551) BP: (123-131)/(59-70) 131/59 mmHg (12/26 0551) SpO2:  [98 %-100 %] 98 % (12/26 0551)  Intake/Output from previous day: 12/25 0701 - 12/26 0700 In: 1320 [P.O.:1320] Out: -  Intake/Output this shift:     Recent Labs  09/10/15 0506 09/11/15 0524  HGB 8.7* 8.3*    Recent Labs  09/10/15 0506 09/11/15 0524  WBC 9.2 9.2  RBC 3.25* 3.10*  HCT 27.6* 26.6*  PLT 310 299    Recent Labs  09/10/15 0506  NA 137  K 4.1  CL 103  CO2 24  BUN 15  CREATININE 1.13*  GLUCOSE 141*  CALCIUM 8.6*   No results for input(s): LABPT, INR in the last 72 hours.  Sensation intact distally Intact pulses distally Dorsiflexion/Plantar flexion intact Incision: scant drainage No cellulitis present  Assessment/Plan: 3 Days Post-Op Procedure(s) (LRB): LEFT TOTAL HIP ARTHROPLASTY ANTERIOR APPROACH (Left) Up with therapy Discharge to SNF today  Vernadine Coombs Y 09/12/2015, 9:42 AM

## 2015-09-12 NOTE — Discharge Instructions (Signed)

## 2015-09-12 NOTE — Discharge Summary (Signed)
Patient ID: Marilyn [name] MRN: [id number] DOB/AGE: [birth year] 46 y.o.  Admit date: 09/09/2015 Discharge date: 09/12/2015  Admission Diagnoses:  Principal Problem:   Osteoarthritis of left hip Active Problems:   Status post total replacement of left hip   Discharge Diagnoses:  Same  Past Medical History  Diagnosis Date  . Hypertension   . Arthritis   . Asthma   . Hypothyroidism     takes levothyroxen  . Shortness of breath     with exertion  . Headache(784.0)     HX OF MIGRAINES  . OSA on CPAP   . Anginal pain (HCC)     pt states experiences chest wall pain pt states related to her asthma   . History of bronchitis   . Anxiety     with MRI's  . GERD (gastroesophageal reflux disease)   . Anemia     taking iron now. pt states having no current issues 09/02/2015  . Wears glasses   . Dizziness     Surgeries: Procedure(s): LEFT TOTAL HIP ARTHROPLASTY ANTERIOR APPROACH on 09/09/2015   Consultants:    Discharged Condition: Improved  Hospital Course: Marilyn [name] is an 44 y.o. female who was admitted 09/09/2015 for operative treatment ofOsteoarthritis of left hip. Patient has severe unremitting pain that affects sleep, daily activities, and work/hobbies. After pre-op clearance the patient was taken to the operating room on 09/09/2015 and underwent  Procedure(s): LEFT TOTAL HIP ARTHROPLASTY ANTERIOR APPROACH.    Patient was given perioperative antibiotics: Anti-infectives    Start     Dose/Rate Route Frequency Ordered Stop   09/09/15 1400  ceFAZolin (ANCEF) IVPB 2 g/50 mL premix     2 g 100 mL/hr over 30 Minutes Intravenous Every 6 hours 09/09/15 1327 09/09/15 2125   09/09/15 0600  ceFAZolin (ANCEF) 3 g in dextrose 5 % 50 mL IVPB     3 g 160 mL/hr over 30 Minutes Intravenous On call to O.R. 09/08/15 1304 09/09/15 0721       Patient was given sequential compression devices, early ambulation, and chemoprophylaxis to prevent DVT.  Patient benefited  maximally from hospital stay and there were no complications.    Recent vital signs: Patient Vitals for the past 24 hrs:  BP Temp Temp src Pulse Resp SpO2  09/12/15 0551 (!) 131/59 mmHg 98.2 F (36.8 C) Axillary 89 16 98 %  09/11/15 2102 123/60 mmHg 98.7 F (37.1 C) Oral 95 17 100 %  09/11/15 1532 129/70 mmHg 97.7 F (36.5 C) Oral 63 18 100 %     Recent laboratory studies:  Recent Labs  09/10/15 0506 09/11/15 0524  WBC 9.2 9.2  HGB 8.7* 8.3*  HCT 27.6* 26.6*  PLT 310 299  NA 137  --   K 4.1  --   CL 103  --   CO2 24  --   BUN 15  --   CREATININE 1.13*  --   GLUCOSE 141*  --   CALCIUM 8.6*  --      Discharge Medications:     Medication List    STOP taking these medications        naproxen 500 MG tablet  Commonly known as:  NAPROSYN     oxyCODONE-acetaminophen 10-325 MG tablet  Commonly known as:  PERCOCET      TAKE these medications        albuterol 108 (90 BASE) MCG/ACT inhaler  Commonly known as:  PROVENTIL HFA;VENTOLIN HFA  Inhale 2 puffs into the  lungs every 6 (six) hours as needed for wheezing or shortness of breath.     amLODipine 5 MG tablet  Commonly known as:  NORVASC  Take 5 mg by mouth daily.     cetirizine 10 MG tablet  Commonly known as:  ZYRTEC  Take 10 mg by mouth daily as needed for allergies.     cyclobenzaprine 10 MG tablet  Commonly known as:  FLEXERIL  Take 1 tablet (10 mg total) by mouth 3 (three) times daily as needed for muscle spasms.     hydrochlorothiazide 25 MG tablet  Commonly known as:  HYDRODIURIL  Take 25 mg by mouth daily.     levothyroxine 100 MCG tablet  Commonly known as:  SYNTHROID, LEVOTHROID  Take 100 mcg by mouth daily before breakfast.     oxybutynin 10 MG 24 hr tablet  Commonly known as:  DITROPAN-XL  Take 10 mg by mouth daily.     oxyCODONE 5 MG immediate release tablet  Commonly known as:  Oxy IR/ROXICODONE  Take 1-3 tablets (5-15 mg total) by mouth every 3 (three) hours as needed for severe pain or  breakthrough pain.     rivaroxaban 10 MG Tabs tablet  Commonly known as:  XARELTO  Take 1 tablet (10 mg total) by mouth daily with breakfast.        Diagnostic Studies: Dg C-arm 1-60 Min-no Report  09/09/2015  CLINICAL DATA: surgery C-ARM 1-60 MINUTES Fluoroscopy was utilized by the requesting physician.  No radiographic interpretation.   Dg Hip Operative Unilat With Pelvis Left  09/09/2015  CLINICAL DATA:  Left total hip replacement EXAM: OPERATIVE left HIP (WITH PELVIS IF PERFORMED) 3 VIEWS TECHNIQUE: Fluoroscopic spot image(s) were submitted for interpretation post-operatively. COMPARISON:  preoperative radiographs 04/21/2015. FINDINGS: 3 images show total hip replacement on the left. Femoral and acetabular components for well positioned. No radiographically detectable complication. IMPRESSION: Good appearance following hip replacement. Electronically Signed   By: Paulina FusiMark  Shogry M.D.   On: 09/09/2015 09:06    Disposition: to skilled nursing      Discharge Instructions    Discharge patient    Complete by:  As directed            Follow-up Information    Follow up with Kathryne HitchBLACKMAN,Pasqualino Witherspoon Y, MD In 2 weeks.   Specialty:  Orthopedic Surgery   Contact information:   [address] [place] [address] [phone]        Signed: Kathryne HitchBLACKMAN,Kenai Fluegel Y 09/12/2015, 9:45 AM

## 2015-09-12 NOTE — Progress Notes (Signed)
RT placed patient on CPAP. Patient home setting is 10 cmH2O. Sterile water added to water chamber for humidification. Patient is tolerating well. RT will continue to monitor.

## 2015-09-13 ENCOUNTER — Encounter (HOSPITAL_COMMUNITY): Payer: Self-pay | Admitting: Orthopaedic Surgery

## 2015-09-29 ENCOUNTER — Encounter (HOSPITAL_COMMUNITY): Payer: Self-pay | Admitting: *Deleted

## 2015-09-29 ENCOUNTER — Emergency Department (HOSPITAL_COMMUNITY)
Admission: EM | Admit: 2015-09-29 | Discharge: 2015-09-29 | Disposition: A | Discharge status: Home / Self Care | Payer: Medicare Other | Attending: Emergency Medicine | Admitting: Emergency Medicine

## 2015-09-29 DIAGNOSIS — Z8739 Personal history of other diseases of the musculoskeletal system and connective tissue: Secondary | ICD-10-CM | POA: Insufficient documentation

## 2015-09-29 DIAGNOSIS — G4733 Obstructive sleep apnea (adult) (pediatric): Secondary | ICD-10-CM | POA: Insufficient documentation

## 2015-09-29 DIAGNOSIS — E039 Hypothyroidism, unspecified: Secondary | ICD-10-CM | POA: Diagnosis not present

## 2015-09-29 DIAGNOSIS — I209 Angina pectoris, unspecified: Secondary | ICD-10-CM | POA: Diagnosis not present

## 2015-09-29 DIAGNOSIS — Z8719 Personal history of other diseases of the digestive system: Secondary | ICD-10-CM | POA: Diagnosis not present

## 2015-09-29 DIAGNOSIS — J029 Acute pharyngitis, unspecified: Secondary | ICD-10-CM | POA: Insufficient documentation

## 2015-09-29 DIAGNOSIS — R491 Aphonia: Secondary | ICD-10-CM | POA: Diagnosis not present

## 2015-09-29 DIAGNOSIS — Z9981 Dependence on supplemental oxygen: Secondary | ICD-10-CM | POA: Diagnosis not present

## 2015-09-29 DIAGNOSIS — J45909 Unspecified asthma, uncomplicated: Secondary | ICD-10-CM | POA: Diagnosis not present

## 2015-09-29 DIAGNOSIS — Z79899 Other long term (current) drug therapy: Secondary | ICD-10-CM | POA: Insufficient documentation

## 2015-09-29 DIAGNOSIS — Z862 Personal history of diseases of the blood and blood-forming organs and certain disorders involving the immune mechanism: Secondary | ICD-10-CM | POA: Insufficient documentation

## 2015-09-29 DIAGNOSIS — R1110 Vomiting, unspecified: Secondary | ICD-10-CM | POA: Diagnosis not present

## 2015-09-29 DIAGNOSIS — I10 Essential (primary) hypertension: Secondary | ICD-10-CM | POA: Diagnosis not present

## 2015-09-29 DIAGNOSIS — Z87891 Personal history of nicotine dependence: Secondary | ICD-10-CM | POA: Diagnosis not present

## 2015-09-29 DIAGNOSIS — K1379 Other lesions of oral mucosa: Secondary | ICD-10-CM

## 2015-09-29 DIAGNOSIS — Z8659 Personal history of other mental and behavioral disorders: Secondary | ICD-10-CM | POA: Insufficient documentation

## 2015-09-29 DIAGNOSIS — Z91040 Latex allergy status: Secondary | ICD-10-CM | POA: Diagnosis not present

## 2015-09-29 LAB — RAPID STREP SCREEN (MED CTR MEBANE ONLY): Streptococcus, Group A Screen (Direct): NEGATIVE

## 2015-09-29 MED ORDER — SODIUM CHLORIDE 0.9 % IV BOLUS (SEPSIS)
1000.0000 mL | Freq: Once | INTRAVENOUS | Status: AC
  Administered 2015-09-29: 1000 mL via INTRAVENOUS

## 2015-09-29 MED ORDER — DEXAMETHASONE SODIUM PHOSPHATE 4 MG/ML IJ SOLN
12.0000 mg | Freq: Once | INTRAMUSCULAR | Status: AC
  Administered 2015-09-29: 12 mg via INTRAVENOUS
  Filled 2015-09-29: qty 3

## 2015-09-29 MED ORDER — DIPHENHYDRAMINE HCL 25 MG PO TABS: 25.0000 mg | ORAL_TABLET | Freq: Four times a day (QID) | ORAL | Status: DC

## 2015-09-29 MED ORDER — DEXTROSE 5 % IV SOLN
1.0000 g | Freq: Once | INTRAVENOUS | Status: AC
  Administered 2015-09-29: 1 g via INTRAVENOUS
  Filled 2015-09-29: qty 10

## 2015-09-29 MED ORDER — DIPHENHYDRAMINE HCL 50 MG/ML IJ SOLN
25.0000 mg | Freq: Once | INTRAMUSCULAR | Status: AC
  Administered 2015-09-29: 25 mg via INTRAVENOUS
  Filled 2015-09-29: qty 1

## 2015-09-29 MED ORDER — MORPHINE SULFATE (PF) 4 MG/ML IV SOLN
4.0000 mg | Freq: Once | INTRAVENOUS | Status: AC
  Administered 2015-09-29: 4 mg via INTRAVENOUS
  Filled 2015-09-29: qty 1

## 2015-09-29 NOTE — ED Provider Notes (Signed)
CSN: [id number]     Arrival date & time 09/29/15  1118 History  By signing my name below, I, Marilyn [name], attest that this documentation has been prepared under the direction and in the presence of Azalia BilisKevin Ima Hafner, MD. Electronically Signed: Marica OtterNusrat [name], ED Scribe. 09/29/2015. 12:22 PM.   Chief Complaint  Patient presents with  . Sore Throat   The history is provided by the patient. No language interpreter was used.   PCP: Josue HectorNYLAND,LEONARD ROBERT, MD HPI Comments: Marilyn [name] is a 45 y.o. female who presents to the Emergency Department complaining of sore throat onset 2 days ago. Associated Sx include losing her voice, decreased intake PO, and pain with swallowing. Pt denies fever at home (at triage pt's temperature is 100.1), nasal congestion, cough.   Past Medical History  Diagnosis Date  . Hypertension   . Arthritis   . Asthma   . Hypothyroidism     takes levothyroxen  . Shortness of breath     with exertion  . Headache(784.0)     HX OF MIGRAINES  . OSA on CPAP   . Anginal pain (HCC)     pt states experiences chest wall pain pt states related to her asthma   . History of bronchitis   . Anxiety     with MRI's  . GERD (gastroesophageal reflux disease)   . Anemia     taking iron now. pt states having no current issues 09/02/2015  . Wears glasses   . Dizziness    Past Surgical History  Procedure Laterality Date  . Cesarean section      times 2  . Cholecystectomy    . Knee arthroscopy    . Rotator cuff repair      left   . Joint replacement  2011    total left knee  . Tubal ligation  1996  . Knee arthroplasty  04/23/2012    right   . Total knee arthroplasty  04/23/2012    Procedure: TOTAL KNEE ARTHROPLASTY;  Surgeon: Nadara MustardMarcus V Duda, MD;  Location: [facility] OR;  Service: Orthopedics;  Laterality: Right;  Right Total Knee Arthroplasty  . Endometrial ablation    . Shoulder surgery      right to repair ligament tear  . Total hip arthroplasty Left 09/09/2015    Procedure:  LEFT TOTAL HIP ARTHROPLASTY ANTERIOR APPROACH;  Surgeon: Kathryne Hitchhristopher Y Blackman, MD;  Location: [facility];  Service: Orthopedics;  Laterality: Left;   Family History  Problem Relation Age of Onset  . Cancer Mother     colon  . Epilepsy Mother   . Cancer Father     prostate  . Diabetes Father   . Hypertension Father   . Hypertension Maternal Aunt   . Diabetes Maternal Aunt   . Hypertension Paternal Aunt    Social History  Substance Use Topics  . Smoking status: Former Smoker -- 0.25 packs/day for 4 years    Types: Cigarettes    Quit date: 09/18/1991  . Smokeless tobacco: Never Used  . Alcohol Use: No   OB History    Gravida Para Term Preterm AB TAB SAB Ectopic Multiple Living   2 2 2       2      Review of Systems A complete 10 system review of systems was obtained and all systems are negative except as noted in the HPI and PMH.   Allergies  Lisinopril; Bee venom; Codeine; Darvocet; Meloxicam; Latex; and Tomato  Home Medications   Prior  to Admission medications   Medication Sig Start Date End Date Taking? Authorizing Provider  albuterol (PROVENTIL HFA;VENTOLIN HFA) 108 (90 BASE) MCG/ACT inhaler Inhale 2 puffs into the lungs every 6 (six) hours as needed for wheezing or shortness of breath.   Yes Historical Provider, MD  amLODipine (NORVASC) 5 MG tablet Take 5 mg by mouth daily.    Yes Historical Provider, MD  amoxicillin-clavulanate (AUGMENTIN) 875-125 MG tablet Take 1 tablet by mouth 2 (two) times daily. 10 day course starting 09/28/2015 09/28/15  Yes Historical Provider, MD  cetirizine (ZYRTEC) 10 MG tablet Take 10 mg by mouth daily as needed for allergies.   Yes Historical Provider, MD  cyclobenzaprine (FLEXERIL) 10 MG tablet Take 1 tablet (10 mg total) by mouth 3 (three) times daily as needed for muscle spasms. 09/12/15  Yes Kathryne Hitch, MD  hydrochlorothiazide 25 MG tablet Take 25 mg by mouth daily.    Yes Historical Provider, MD  levothyroxine (SYNTHROID,  LEVOTHROID) 100 MCG tablet Take 100 mcg by mouth daily before breakfast.   Yes Historical Provider, MD  oxybutynin (DITROPAN-XL) 10 MG 24 hr tablet Take 10 mg by mouth daily.    Yes Historical Provider, MD  oxycodone (ROXICODONE) 30 MG immediate release tablet Take 30 mg by mouth 2 (two) times daily. 09/08/15  Yes Historical Provider, MD  oxyCODONE-acetaminophen (PERCOCET) 10-325 MG tablet Take 1-2 tablets by mouth every 4 (four) hours as needed for pain.  09/25/15  Yes Historical Provider, MD  polyethylene glycol powder (GLYCOLAX/MIRALAX) powder Take 17 g by mouth daily. 09/26/15  Yes Historical Provider, MD  Influenza Virus Vacc Split PF (AFLURIA PRESERVATIVE FREE) 0.5 ML SUSY Reported on 09/29/2015 07/30/14   Historical Provider, MD  oxyCODONE (OXY IR/ROXICODONE) 5 MG immediate release tablet Take 1-3 tablets (5-15 mg total) by mouth every 3 (three) hours as needed for severe pain or breakthrough pain. Patient not taking: Reported on 09/29/2015 09/12/15   Kathryne Hitch, MD  pneumococcal 13-valent conjugate vaccine (PREVNAR 13) SUSP injection Reported on 09/29/2015 08/10/14   Historical Provider, MD  rivaroxaban (XARELTO) 10 MG TABS tablet Take 1 tablet (10 mg total) by mouth daily with breakfast. Patient not taking: Reported on 09/29/2015 09/12/15   Kathryne Hitch, MD   Triage Vitals: BP 127/67 mmHg  Pulse 100  Temp(Src) 100.1 F (37.8 C) (Tympanic)  Resp 18  Ht 5\' 2"  (1.575 m)  Wt 290 lb (131.543 kg)  BMI 53.03 kg/m2  SpO2 100% Physical Exam  Constitutional: She is oriented to person, place, and time. She appears well-developed and well-nourished. No distress.  HENT:  Head: Normocephalic and atraumatic.  Mouth/Throat: Uvula is midline.  Tonsillar swelling, uvula edema.   Eyes: EOM are normal.  Neck: Normal range of motion.  Cardiovascular: Normal rate, regular rhythm and normal heart sounds.   Pulmonary/Chest: Effort normal and breath sounds normal.  No stridor    Abdominal: Soft. She exhibits no distension. There is no tenderness.  Musculoskeletal: Normal range of motion.  Neurological: She is alert and oriented to person, place, and time.  Skin: Skin is warm and dry.  Psychiatric: She has a normal mood and affect. Judgment normal.  Nursing note and vitals reviewed.   ED Course  Procedures (including critical care time) DIAGNOSTIC STUDIES: Oxygen Saturation is 100% on ra, nl by my interpretation.    COORDINATION OF CARE: 12:21 PM: Discussed treatment plan which includes meds and labs with pt at bedside; patient verbalizes understanding and agrees with treatment plan.  Labs Review Labs Reviewed  RAPID STREP SCREEN (NOT AT [facility])   I have personally reviewed and evaluated these lab results as part of my medical decision-making.   MDM   Final diagnoses:  None    2:17 PM Patient feels better this time.  Improve speech.  She will continue her antibiotics.  IV Decadron given.  Hydrated with fluids.  Benadryl given.  Tolerating secretions.  Oral airway patent.  No stridor.  Uvular edema.  No tongue swelling or lip swelling.  Overall well-appearing.  Primary care follow-up.  She understands to return to the ER for new or worsening symptoms  I personally performed the services described in this documentation, which was scribed in my presence. The recorded information has been reviewed and is accurate.       Azalia Bilis, MD 09/29/15 [phone]

## 2015-09-29 NOTE — ED Notes (Addendum)
Pt seen at urgent care yesterday for sore throat. Pt was told it was from drainage. States she feels like her throat is getting "tighter and tighter". Vomited x 1 yesterday. Pt has muffled voice and states she is unable to swallow. Tonsils are enlarged.

## 2015-09-29 NOTE — ED Notes (Signed)
Patient with no complaints at this time. Respirations even and unlabored. Skin warm/dry. Discharge instructions reviewed with patient at this time. Patient given opportunity to voice concerns/ask questions. IV removed per policy and band-aid applied to site. Patient discharged at this time and left Emergency Department with steady gait.  

## 2015-09-29 NOTE — ED Notes (Signed)
Not able to obtain IV access.  2nd nurse to try.

## 2015-10-02 LAB — CULTURE, GROUP A STREP (THRC)

## 2015-10-06 ENCOUNTER — Emergency Department (HOSPITAL_COMMUNITY)
Admission: EM | Admit: 2015-10-06 | Discharge: 2015-10-06 | Disposition: A | Discharge status: Home / Self Care | Payer: Medicare Other | Attending: Emergency Medicine | Admitting: Emergency Medicine

## 2015-10-06 ENCOUNTER — Encounter (HOSPITAL_COMMUNITY): Payer: Self-pay | Admitting: Emergency Medicine

## 2015-10-06 DIAGNOSIS — I209 Angina pectoris, unspecified: Secondary | ICD-10-CM | POA: Insufficient documentation

## 2015-10-06 DIAGNOSIS — F17210 Nicotine dependence, cigarettes, uncomplicated: Secondary | ICD-10-CM | POA: Insufficient documentation

## 2015-10-06 DIAGNOSIS — Z91040 Latex allergy status: Secondary | ICD-10-CM | POA: Insufficient documentation

## 2015-10-06 DIAGNOSIS — E039 Hypothyroidism, unspecified: Secondary | ICD-10-CM | POA: Insufficient documentation

## 2015-10-06 DIAGNOSIS — M1990 Unspecified osteoarthritis, unspecified site: Secondary | ICD-10-CM | POA: Diagnosis not present

## 2015-10-06 DIAGNOSIS — Z9989 Dependence on other enabling machines and devices: Secondary | ICD-10-CM | POA: Diagnosis not present

## 2015-10-06 DIAGNOSIS — I10 Essential (primary) hypertension: Secondary | ICD-10-CM | POA: Insufficient documentation

## 2015-10-06 DIAGNOSIS — Z862 Personal history of diseases of the blood and blood-forming organs and certain disorders involving the immune mechanism: Secondary | ICD-10-CM | POA: Insufficient documentation

## 2015-10-06 DIAGNOSIS — J45909 Unspecified asthma, uncomplicated: Secondary | ICD-10-CM | POA: Diagnosis not present

## 2015-10-06 DIAGNOSIS — M7989 Other specified soft tissue disorders: Secondary | ICD-10-CM | POA: Insufficient documentation

## 2015-10-06 DIAGNOSIS — G4733 Obstructive sleep apnea (adult) (pediatric): Secondary | ICD-10-CM | POA: Diagnosis not present

## 2015-10-06 DIAGNOSIS — G8929 Other chronic pain: Secondary | ICD-10-CM | POA: Insufficient documentation

## 2015-10-06 DIAGNOSIS — Z8659 Personal history of other mental and behavioral disorders: Secondary | ICD-10-CM | POA: Diagnosis not present

## 2015-10-06 DIAGNOSIS — Z9981 Dependence on supplemental oxygen: Secondary | ICD-10-CM | POA: Diagnosis not present

## 2015-10-06 DIAGNOSIS — Z8719 Personal history of other diseases of the digestive system: Secondary | ICD-10-CM | POA: Insufficient documentation

## 2015-10-06 DIAGNOSIS — G43909 Migraine, unspecified, not intractable, without status migrainosus: Secondary | ICD-10-CM | POA: Insufficient documentation

## 2015-10-06 DIAGNOSIS — Z792 Long term (current) use of antibiotics: Secondary | ICD-10-CM | POA: Diagnosis not present

## 2015-10-06 DIAGNOSIS — M25511 Pain in right shoulder: Secondary | ICD-10-CM | POA: Insufficient documentation

## 2015-10-06 DIAGNOSIS — Z79899 Other long term (current) drug therapy: Secondary | ICD-10-CM | POA: Diagnosis not present

## 2015-10-06 MED ORDER — NAPROXEN 500 MG PO TABS: 500.0000 mg | ORAL_TABLET | Freq: Two times a day (BID) | ORAL | Status: DC

## 2015-10-06 NOTE — ED Provider Notes (Signed)
CSN: [id number]     Arrival date & time 10/06/15  0857 History   First MD Initiated Contact with Patient 10/06/15 [phone]     Chief Complaint  Patient presents with  . Shoulder Pain     (Consider location/radiation/quality/duration/timing/severity/associated sxs/prior Treatment) The history is provided by the patient.   Marilyn [name] is a 45 y.o. female presenting with acute on chronic right shoulder pain. She has a history of arthritis in this joint and has had arthroscopic surgery by Dr Lajoyce Corners approximately 2 years ago.  She underwent a total left hip arthroplasty last month and was in a wheelchair, and has aggravated her right shoulder with the motion required to roll the chair while she was recovering from the surgery.  She has seen Dr. Lajoyce Corners for this new shoulder pain, x-rays revealed arthritis per patient's report.  She had a steroid injection last month which has not improved her pain.  She is currently taking oxycodone 10 mg tablets for her hip surgery which is not alleviating her right shoulder pain.  She denies any other injury or fall to the shoulder.  It feels swollen to her, there has been no redness, rash, radiation of pain.  She denies chest pain or shortness of breath.  Pain is worsened with movement but is also constant at rest.  She is scheduled for follow-up with Dr. Lajoyce Corners in 2 weeks.     Past Medical History  Diagnosis Date  . Hypertension   . Arthritis   . Asthma   . Hypothyroidism     takes levothyroxen  . Shortness of breath     with exertion  . Headache(784.0)     HX OF MIGRAINES  . OSA on CPAP   . Anginal pain (HCC)     pt states experiences chest wall pain pt states related to her asthma   . History of bronchitis   . Anxiety     with MRI's  . GERD (gastroesophageal reflux disease)   . Anemia     taking iron now. pt states having no current issues 09/02/2015  . Wears glasses   . Dizziness    Past Surgical History  Procedure Laterality Date  . Cesarean  section      times 2  . Cholecystectomy    . Knee arthroscopy    . Rotator cuff repair      left   . Joint replacement  2011    total left knee  . Tubal ligation  1996  . Knee arthroplasty  04/23/2012    right   . Total knee arthroplasty  04/23/2012    Procedure: TOTAL KNEE ARTHROPLASTY;  Surgeon: [name], MD;  Location: [facility] OR;  Service: Orthopedics;  Laterality: Right;  Right Total Knee Arthroplasty  . Endometrial ablation    . Shoulder surgery      right to repair ligament tear  . Total hip arthroplasty Left 09/09/2015    Procedure: LEFT TOTAL HIP ARTHROPLASTY ANTERIOR APPROACH;  Surgeon: Kathryne Hitch, MD;  Location: [facility];  Service: Orthopedics;  Laterality: Left;   Family History  Problem Relation Age of Onset  . Cancer Mother     colon  . Epilepsy Mother   . Cancer Father     prostate  . Diabetes Father   . Hypertension Father   . Hypertension Maternal Aunt   . Diabetes Maternal Aunt   . Hypertension Paternal Aunt    Social History  Substance Use Topics  . Smoking  status: Former Smoker -- 0.25 packs/day for 4 years    Types: Cigarettes    Quit date: 09/18/1991  . Smokeless tobacco: Never Used  . Alcohol Use: No   OB History    Gravida Para Term Preterm AB TAB SAB Ectopic Multiple Living   Review of Systems  Constitutional: Negative for fever.  Musculoskeletal: Positive for joint swelling and arthralgias. Negative for myalgias.  Skin: Negative for rash.  Neurological: Negative for weakness and numbness.      Allergies  Lisinopril; Bee venom; Codeine; Darvocet; Meloxicam; Latex; and Tomato  Home Medications   Prior to Admission medications   Medication Sig Start Date End Date Taking? Authorizing Provider  amLODipine (NORVASC) 5 MG tablet Take 5 mg by mouth daily.    Yes Historical Provider, MD  amoxicillin-clavulanate (AUGMENTIN) 875-125 MG tablet Take 1 tablet by mouth 2 (two) times daily. 10 day course starting  09/28/2015 09/28/15  Yes Historical Provider, MD  cetirizine (ZYRTEC) 10 MG tablet Take 10 mg by mouth daily as needed for allergies.   Yes Historical Provider, MD  hydrochlorothiazide 25 MG tablet Take 25 mg by mouth daily.    Yes Historical Provider, MD  levothyroxine (SYNTHROID, LEVOTHROID) 100 MCG tablet Take 100 mcg by mouth daily before breakfast.   Yes Historical Provider, MD  oxybutynin (DITROPAN-XL) 10 MG 24 hr tablet Take 10 mg by mouth daily.    Yes Historical Provider, MD  oxyCODONE-acetaminophen (PERCOCET) 10-325 MG tablet Take 1-2 tablets by mouth every 4 (four) hours as needed for pain.  09/25/15  Yes Historical Provider, MD  polyethylene glycol powder (GLYCOLAX/MIRALAX) powder Take 17 g by mouth daily. 09/26/15  Yes Historical Provider, MD  albuterol (PROVENTIL HFA;VENTOLIN HFA) 108 (90 BASE) MCG/ACT inhaler Inhale 2 puffs into the lungs every 6 (six) hours as needed for wheezing or shortness of breath.    Historical Provider, MD  cyclobenzaprine (FLEXERIL) 10 MG tablet Take 1 tablet (10 mg total) by mouth 3 (three) times daily as needed for muscle spasms. 09/12/15   Kathryne Hitch, MD  diphenhydrAMINE (BENADRYL) 25 MG tablet Take 1 tablet (25 mg total) by mouth every 6 (six) hours. 09/29/15   Azalia Bilis, MD  Influenza Virus Vacc Split PF (AFLURIA PRESERVATIVE FREE) 0.5 ML SUSY Reported on 09/29/2015 07/30/14   Historical Provider, MD  naproxen (NAPROSYN) 500 MG tablet Take 1 tablet (500 mg total) by mouth 2 (two) times daily. 10/06/15   Burgess Amor, PA-C  oxyCODONE (OXY IR/ROXICODONE) 5 MG immediate release tablet Take 1-3 tablets (5-15 mg total) by mouth every 3 (three) hours as needed for severe pain or breakthrough pain. Patient not taking: Reported on 09/29/2015 09/12/15   Kathryne Hitch, MD  pneumococcal 13-valent conjugate vaccine (PREVNAR 13) SUSP injection Reported on 09/29/2015 08/10/14   Historical Provider, MD  rivaroxaban (XARELTO) 10 MG TABS tablet Take 1 tablet (10  mg total) by mouth daily with breakfast. Patient not taking: Reported on 09/29/2015 09/12/15   Kathryne Hitch, MD   BP 123/83 mmHg  Pulse 88  Temp(Src) 97.8 F (36.6 C) (Oral)  Resp 16  Ht  (1.6 m)  Wt 133.811 kg  BMI 52.27 kg/m2  SpO2 100% Physical Exam  Constitutional: She appears well-developed and well-nourished.  HENT:  Head: Atraumatic.  Neck: Normal range of motion.  Cardiovascular:  Pulses equal bilaterally  Musculoskeletal: She exhibits tenderness.  Right shoulder: She exhibits tenderness. She exhibits no swelling, no effusion, no crepitus, no deformity, no spasm, normal pulse and normal strength.  ttp right anterior shoulder.  No appreciable edema in comparison to the left shoulder. No erythema, no rash. Pain worsened with passive and active ROM.  Neurological: She is alert. She has normal strength. She displays normal reflexes. No sensory deficit.  Equal grip strength.  Skin: Skin is warm and dry.  Psychiatric: She has a normal mood and affect.    ED Course  Procedures (including critical care time) Labs Review Labs Reviewed - No data to display  Imaging Review No results found. I have personally reviewed and evaluated these images and lab results as part of my medical decision-making.   EKG Interpretation None      MDM   Final diagnoses:  Chronic shoulder pain, right    Pt advised heat tx, sling provided, discussed for prn use, make sure maintains ROM.  Naproxen prescribed.  States has had this in the past and has been helpful without increasing stomach upset as meloxicam does. Heat tx.  F/u with Dr. Lajoyce Corners as planned.  The patient appears reasonably screened and/or stabilized for discharge and I doubt any other medical condition or other [facility] requiring further screening, evaluation, or treatment in the ED at this time prior to discharge.     Burgess Amor, PA-C 10/06/15 1026  Rolland Porter, MD 10/06/15 1036

## 2015-10-06 NOTE — ED Notes (Signed)
Pt states that she has been having right shoulder pain with no injury since the middle of December.

## 2015-10-06 NOTE — Discharge Instructions (Signed)
Shoulder Pain The shoulder is the joint that connects your arms to your body. The bones that form the shoulder joint include the upper arm bone (humerus), the shoulder blade (scapula), and the collarbone (clavicle). The top of the humerus is shaped like a ball and fits into a rather flat socket on the scapula (glenoid cavity). A combination of muscles and strong, fibrous tissues that connect muscles to bones (tendons) support your shoulder joint and hold the ball in the socket. Small, fluid-filled sacs (bursae) are located in different areas of the joint. They act as cushions between the bones and the overlying soft tissues and help reduce friction between the gliding tendons and the bone as you move your arm. Your shoulder joint allows a wide range of motion in your arm. This range of motion allows you to do things like scratch your back or throw a ball. However, this range of motion also makes your shoulder more prone to pain from overuse and injury. Causes of shoulder pain can originate from both injury and overuse and usually can be grouped in the following four categories:  Redness, swelling, and pain (inflammation) of the tendon (tendinitis) or the bursae (bursitis).  Instability, such as a dislocation of the joint.  Inflammation of the joint (arthritis).  Broken bone (fracture). HOME CARE INSTRUCTIONS   Apply heat to your shoulder 20 minutes 3 times daily.  If you have a shoulder sling or immobilizer, wear it for comfort.  Make sure you are maintaining range of motion however in your shoulder as discussed.  Only take over-the-counter or prescription medicines for pain, discomfort, or fever as directed by your caregiver. SEEK MEDICAL CARE IF:   Your shoulder pain increases, or new pain develops in your arm, hand, or fingers.  Your hand or fingers become cold and numb.  Your pain is not relieved with medicines. SEEK IMMEDIATE MEDICAL CARE IF:   Your arm, hand, or fingers are numb or  tingling.  Your arm, hand, or fingers are significantly swollen or turn white or blue. MAKE SURE YOU:   Understand these instructions.  Will watch your condition.  Will get help right away if you are not doing well or get worse.   This information is not intended to replace advice given to you by your health care provider. Make sure you discuss any questions you have with your health care provider.   Document Released: 06/13/2005 Document Revised: 09/24/2014 Document Reviewed: 12/27/2014 Elsevier Interactive Patient Education [employer]

## 2015-11-09 ENCOUNTER — Other Ambulatory Visit: Payer: Self-pay | Admitting: Physician Assistant

## 2015-11-09 ENCOUNTER — Encounter (HOSPITAL_COMMUNITY): Payer: Self-pay | Admitting: *Deleted

## 2015-11-10 ENCOUNTER — Inpatient Hospital Stay (HOSPITAL_COMMUNITY): Payer: Medicare Other | Admitting: Anesthesiology

## 2015-11-10 ENCOUNTER — Inpatient Hospital Stay (HOSPITAL_COMMUNITY)
Admission: AD | Admit: 2015-11-10 | Discharge: 2015-11-14 | DRG: 857 | Disposition: A | Discharge status: Home / Self Care | Payer: Medicare Other | Source: Ambulatory Visit | Attending: Orthopaedic Surgery | Admitting: Orthopaedic Surgery

## 2015-11-10 ENCOUNTER — Encounter (HOSPITAL_COMMUNITY)
Admission: AD | Disposition: A | Discharge status: Home / Self Care | Payer: Self-pay | Source: Ambulatory Visit | Attending: Orthopaedic Surgery

## 2015-11-10 ENCOUNTER — Encounter (HOSPITAL_COMMUNITY): Payer: Self-pay | Admitting: *Deleted

## 2015-11-10 DIAGNOSIS — E662 Morbid (severe) obesity with alveolar hypoventilation: Secondary | ICD-10-CM | POA: Diagnosis present

## 2015-11-10 DIAGNOSIS — M25552 Pain in left hip: Secondary | ICD-10-CM | POA: Diagnosis present

## 2015-11-10 DIAGNOSIS — T8131XA Disruption of external operation (surgical) wound, not elsewhere classified, initial encounter: Secondary | ICD-10-CM

## 2015-11-10 DIAGNOSIS — E039 Hypothyroidism, unspecified: Secondary | ICD-10-CM | POA: Diagnosis present

## 2015-11-10 DIAGNOSIS — IMO0001 Reserved for inherently not codable concepts without codable children: Secondary | ICD-10-CM

## 2015-11-10 DIAGNOSIS — T814XXA Infection following a procedure, initial encounter: Secondary | ICD-10-CM | POA: Diagnosis present

## 2015-11-10 DIAGNOSIS — Z6841 Body Mass Index (BMI) 40.0 and over, adult: Secondary | ICD-10-CM | POA: Diagnosis not present

## 2015-11-10 DIAGNOSIS — Z96651 Presence of right artificial knee joint: Secondary | ICD-10-CM | POA: Diagnosis present

## 2015-11-10 DIAGNOSIS — L02415 Cutaneous abscess of right lower limb: Secondary | ICD-10-CM | POA: Diagnosis present

## 2015-11-10 DIAGNOSIS — T8149XA Infection following a procedure, other surgical site, initial encounter: Secondary | ICD-10-CM | POA: Diagnosis present

## 2015-11-10 DIAGNOSIS — Z87891 Personal history of nicotine dependence: Secondary | ICD-10-CM | POA: Diagnosis not present

## 2015-11-10 DIAGNOSIS — Z96642 Presence of left artificial hip joint: Secondary | ICD-10-CM | POA: Diagnosis present

## 2015-11-10 DIAGNOSIS — B999 Unspecified infectious disease: Secondary | ICD-10-CM

## 2015-11-10 HISTORY — PX: INCISION AND DRAINAGE HIP: SHX1801

## 2015-11-10 LAB — POCT I-STAT 4, (NA,K, GLUC, HGB,HCT)
Glucose, Bld: 85 mg/dL (ref 65–99)
HCT: 33 % — ABNORMAL LOW (ref 36.0–46.0)
HEMOGLOBIN: 11.2 g/dL — AB (ref 12.0–15.0)
POTASSIUM: 3.5 mmol/L (ref 3.5–5.1)
SODIUM: 137 mmol/L (ref 135–145)

## 2015-11-10 SURGERY — IRRIGATION AND DEBRIDEMENT HIP
Anesthesia: General | Site: Hip | Laterality: Left

## 2015-11-10 MED ORDER — CEFAZOLIN SODIUM-DEXTROSE 2-3 GM-% IV SOLR
2.0000 g | Freq: Three times a day (TID) | INTRAVENOUS | Status: AC
  Administered 2015-11-11 – 2015-11-13 (×9): 2 g via INTRAVENOUS
  Filled 2015-11-10 (×10): qty 50

## 2015-11-10 MED ORDER — SUCCINYLCHOLINE CHLORIDE 20 MG/ML IJ SOLN
INTRAMUSCULAR | Status: AC
  Filled 2015-11-10: qty 1

## 2015-11-10 MED ORDER — ACETAMINOPHEN 650 MG RE SUPP: 650.0000 mg | Freq: Four times a day (QID) | RECTAL | Status: DC | PRN

## 2015-11-10 MED ORDER — ROCURONIUM BROMIDE 50 MG/5ML IV SOLN
INTRAVENOUS | Status: AC
  Filled 2015-11-10: qty 1

## 2015-11-10 MED ORDER — DEXAMETHASONE SODIUM PHOSPHATE 10 MG/ML IJ SOLN
INTRAMUSCULAR | Status: AC
  Filled 2015-11-10: qty 1

## 2015-11-10 MED ORDER — ONDANSETRON HCL 4 MG/2ML IJ SOLN
INTRAMUSCULAR | Status: DC | PRN
  Administered 2015-11-10: 4 mg via INTRAVENOUS

## 2015-11-10 MED ORDER — LACTATED RINGERS IV SOLN
INTRAVENOUS | Status: DC
  Administered 2015-11-10 (×3): via INTRAVENOUS

## 2015-11-10 MED ORDER — ALBUTEROL SULFATE (2.5 MG/3ML) 0.083% IN NEBU: 2.5000 mg | INHALATION_SOLUTION | Freq: Four times a day (QID) | RESPIRATORY_TRACT | Status: DC | PRN

## 2015-11-10 MED ORDER — ESCITALOPRAM OXALATE 10 MG PO TABS
10.0000 mg | ORAL_TABLET | Freq: Every day | ORAL | Status: DC
  Administered 2015-11-11 – 2015-11-13 (×4): 10 mg via ORAL
  Filled 2015-11-10 (×4): qty 1

## 2015-11-10 MED ORDER — METOCLOPRAMIDE HCL 5 MG/ML IJ SOLN: 5.0000 mg | Freq: Three times a day (TID) | INTRAMUSCULAR | Status: DC | PRN

## 2015-11-10 MED ORDER — OXYCODONE HCL 5 MG PO TABS
ORAL_TABLET | ORAL | Status: AC
  Filled 2015-11-10: qty 2

## 2015-11-10 MED ORDER — PROPOFOL 10 MG/ML IV BOLUS
INTRAVENOUS | Status: DC | PRN
  Administered 2015-11-10: 50 mg via INTRAVENOUS
  Administered 2015-11-10: 200 mg via INTRAVENOUS

## 2015-11-10 MED ORDER — DEXAMETHASONE SODIUM PHOSPHATE 10 MG/ML IJ SOLN
INTRAMUSCULAR | Status: DC | PRN
  Administered 2015-11-10: 10 mg via INTRAVENOUS

## 2015-11-10 MED ORDER — METHOCARBAMOL 500 MG PO TABS
ORAL_TABLET | ORAL | Status: AC
  Filled 2015-11-10: qty 1

## 2015-11-10 MED ORDER — ONDANSETRON HCL 4 MG/2ML IJ SOLN: 4.0000 mg | Freq: Four times a day (QID) | INTRAMUSCULAR | Status: DC | PRN

## 2015-11-10 MED ORDER — ONDANSETRON HCL 4 MG PO TABS: 4.0000 mg | ORAL_TABLET | Freq: Four times a day (QID) | ORAL | Status: DC | PRN

## 2015-11-10 MED ORDER — ACETAMINOPHEN 325 MG PO TABS: 650.0000 mg | ORAL_TABLET | Freq: Four times a day (QID) | ORAL | Status: DC | PRN

## 2015-11-10 MED ORDER — FENTANYL CITRATE (PF) 100 MCG/2ML IJ SOLN
25.0000 ug | INTRAMUSCULAR | Status: DC | PRN
  Administered 2015-11-10: 25 ug via INTRAVENOUS
  Administered 2015-11-10 (×2): 50 ug via INTRAVENOUS

## 2015-11-10 MED ORDER — SODIUM CHLORIDE 0.9 % IV SOLN: INTRAVENOUS | Status: DC

## 2015-11-10 MED ORDER — MIDAZOLAM HCL 5 MG/5ML IJ SOLN
INTRAMUSCULAR | Status: DC | PRN
  Administered 2015-11-10: 2 mg via INTRAVENOUS

## 2015-11-10 MED ORDER — PHENYLEPHRINE 40 MCG/ML (10ML) SYRINGE FOR IV PUSH (FOR BLOOD PRESSURE SUPPORT)
PREFILLED_SYRINGE | INTRAVENOUS | Status: AC
  Filled 2015-11-10: qty 10

## 2015-11-10 MED ORDER — HYDROMORPHONE HCL 1 MG/ML IJ SOLN: 1.0000 mg | INTRAMUSCULAR | Status: DC | PRN

## 2015-11-10 MED ORDER — PROPOFOL 10 MG/ML IV BOLUS
INTRAVENOUS | Status: AC
  Filled 2015-11-10: qty 20

## 2015-11-10 MED ORDER — LEVOTHYROXINE SODIUM 100 MCG PO TABS
100.0000 ug | ORAL_TABLET | Freq: Every day | ORAL | Status: DC
  Administered 2015-11-11 – 2015-11-14 (×4): 100 ug via ORAL
  Filled 2015-11-10 (×4): qty 1

## 2015-11-10 MED ORDER — METHOCARBAMOL 500 MG PO TABS
500.0000 mg | ORAL_TABLET | Freq: Four times a day (QID) | ORAL | Status: DC | PRN
  Administered 2015-11-10 – 2015-11-13 (×5): 500 mg via ORAL
  Filled 2015-11-10 (×4): qty 1

## 2015-11-10 MED ORDER — CHLORHEXIDINE GLUCONATE 4 % EX LIQD: 60.0000 mL | Freq: Once | CUTANEOUS | Status: DC

## 2015-11-10 MED ORDER — OXYCODONE HCL 5 MG PO TABS
5.0000 mg | ORAL_TABLET | ORAL | Status: DC | PRN
  Administered 2015-11-10: 10 mg via ORAL
  Administered 2015-11-11 (×4): 5 mg via ORAL
  Administered 2015-11-11 – 2015-11-14 (×6): 10 mg via ORAL
  Filled 2015-11-10 (×3): qty 2
  Filled 2015-11-10: qty 1
  Filled 2015-11-10: qty 2
  Filled 2015-11-10 (×3): qty 1
  Filled 2015-11-10 (×2): qty 2

## 2015-11-10 MED ORDER — FENTANYL CITRATE (PF) 100 MCG/2ML IJ SOLN
INTRAMUSCULAR | Status: AC
  Filled 2015-11-10: qty 2

## 2015-11-10 MED ORDER — PROMETHAZINE HCL 25 MG/ML IJ SOLN: 6.2500 mg | INTRAMUSCULAR | Status: DC | PRN

## 2015-11-10 MED ORDER — SODIUM CHLORIDE 0.9 % IR SOLN
Status: DC | PRN
  Administered 2015-11-10 (×2): 3000 mL

## 2015-11-10 MED ORDER — AMLODIPINE BESYLATE 5 MG PO TABS
5.0000 mg | ORAL_TABLET | Freq: Every day | ORAL | Status: DC
  Administered 2015-11-11 – 2015-11-14 (×4): 5 mg via ORAL
  Filled 2015-11-10 (×4): qty 1

## 2015-11-10 MED ORDER — LIDOCAINE HCL (CARDIAC) 20 MG/ML IV SOLN
INTRAVENOUS | Status: AC
  Filled 2015-11-10: qty 5

## 2015-11-10 MED ORDER — DOCUSATE SODIUM 100 MG PO CAPS
100.0000 mg | ORAL_CAPSULE | Freq: Two times a day (BID) | ORAL | Status: DC
  Administered 2015-11-11 – 2015-11-14 (×8): 100 mg via ORAL
  Filled 2015-11-10 (×8): qty 1

## 2015-11-10 MED ORDER — METHOCARBAMOL 1000 MG/10ML IJ SOLN
500.0000 mg | Freq: Four times a day (QID) | INTRAVENOUS | Status: DC | PRN
  Filled 2015-11-10: qty 5

## 2015-11-10 MED ORDER — METOCLOPRAMIDE HCL 5 MG PO TABS: 5.0000 mg | ORAL_TABLET | Freq: Three times a day (TID) | ORAL | Status: DC | PRN

## 2015-11-10 MED ORDER — POLYETHYLENE GLYCOL 3350 17 G PO PACK
17.0000 g | PACK | Freq: Every day | ORAL | Status: DC | PRN
  Administered 2015-11-12: 17 g via ORAL
  Filled 2015-11-10 (×2): qty 1

## 2015-11-10 MED ORDER — FENTANYL CITRATE (PF) 100 MCG/2ML IJ SOLN
INTRAMUSCULAR | Status: DC | PRN
  Administered 2015-11-10: 50 ug via INTRAVENOUS
  Administered 2015-11-10: 100 ug via INTRAVENOUS
  Administered 2015-11-10 (×2): 50 ug via INTRAVENOUS

## 2015-11-10 MED ORDER — DIPHENHYDRAMINE HCL 12.5 MG/5ML PO ELIX: 12.5000 mg | ORAL_SOLUTION | ORAL | Status: DC | PRN

## 2015-11-10 MED ORDER — EPHEDRINE SULFATE 50 MG/ML IJ SOLN
INTRAMUSCULAR | Status: AC
  Filled 2015-11-10: qty 1

## 2015-11-10 MED ORDER — LIDOCAINE HCL (CARDIAC) 20 MG/ML IV SOLN
INTRAVENOUS | Status: DC | PRN
  Administered 2015-11-10: 60 mg via INTRAVENOUS

## 2015-11-10 MED ORDER — HYDROCHLOROTHIAZIDE 25 MG PO TABS
25.0000 mg | ORAL_TABLET | Freq: Every day | ORAL | Status: DC
  Administered 2015-11-11 – 2015-11-14 (×5): 25 mg via ORAL
  Filled 2015-11-10 (×5): qty 1

## 2015-11-10 MED ORDER — MIDAZOLAM HCL 2 MG/2ML IJ SOLN
INTRAMUSCULAR | Status: AC
  Filled 2015-11-10: qty 2

## 2015-11-10 MED ORDER — PHENYLEPHRINE HCL 10 MG/ML IJ SOLN
INTRAMUSCULAR | Status: DC | PRN
  Administered 2015-11-10: 40 ug via INTRAVENOUS

## 2015-11-10 MED ORDER — FENTANYL CITRATE (PF) 250 MCG/5ML IJ SOLN
INTRAMUSCULAR | Status: AC
  Filled 2015-11-10: qty 5

## 2015-11-10 MED ORDER — DEXTROSE 5 % IV SOLN
3.0000 g | INTRAVENOUS | Status: AC
  Administered 2015-11-10: 3 g via INTRAVENOUS
  Filled 2015-11-10: qty 3000

## 2015-11-10 MED ORDER — ONDANSETRON HCL 4 MG/2ML IJ SOLN
INTRAMUSCULAR | Status: AC
  Filled 2015-11-10: qty 2

## 2015-11-10 MED ORDER — SODIUM CHLORIDE 0.9 % IV SOLN
INTRAVENOUS | Status: DC
  Administered 2015-11-10 – 2015-11-11 (×2): via INTRAVENOUS

## 2015-11-10 MED ORDER — OXYBUTYNIN CHLORIDE ER 10 MG PO TB24
10.0000 mg | ORAL_TABLET | Freq: Every day | ORAL | Status: DC
  Administered 2015-11-11 – 2015-11-14 (×4): 10 mg via ORAL
  Filled 2015-11-10 (×4): qty 1

## 2015-11-10 SURGICAL SUPPLY — 54 items
BAG DECANTER FOR FLEXI CONT (MISCELLANEOUS) IMPLANT
COVER SURGICAL LIGHT HANDLE (MISCELLANEOUS) ×2 IMPLANT
DRAPE IMP U-DRAPE 54X76 (DRAPES) ×2 IMPLANT
DRAPE ORTHO SPLIT 77X108 STRL (DRAPES) ×2
DRAPE SURG ORHT 6 SPLT 77X108 (DRAPES) ×2 IMPLANT
DRAPE U-SHAPE 47X51 STRL (DRAPES) ×2 IMPLANT
DRSG ADAPTIC 3X8 NADH LF (GAUZE/BANDAGES/DRESSINGS) ×2 IMPLANT
DRSG AQUACEL AG ADV 3.5X10 (GAUZE/BANDAGES/DRESSINGS) ×2 IMPLANT
DRSG PAD ABDOMINAL 8X10 ST (GAUZE/BANDAGES/DRESSINGS) ×2 IMPLANT
DURAPREP 26ML APPLICATOR (WOUND CARE) ×2 IMPLANT
ELECT CAUTERY BLADE 6.4 (BLADE) IMPLANT
ELECT REM PT RETURN 9FT ADLT (ELECTROSURGICAL)
ELECTRODE REM PT RTRN 9FT ADLT (ELECTROSURGICAL) IMPLANT
GAUZE SPONGE 4X4 12PLY STRL (GAUZE/BANDAGES/DRESSINGS) ×2 IMPLANT
GAUZE XEROFORM 1X8 LF (GAUZE/BANDAGES/DRESSINGS) ×2 IMPLANT
GLOVE BIO SURGEON STRL SZ8 (GLOVE) ×2 IMPLANT
GLOVE BIOGEL PI IND STRL 6.5 (GLOVE) ×1 IMPLANT
GLOVE BIOGEL PI IND STRL 8 (GLOVE) ×1 IMPLANT
GLOVE BIOGEL PI INDICATOR 6.5 (GLOVE) ×1
GLOVE BIOGEL PI INDICATOR 8 (GLOVE) ×1
GLOVE ORTHO TXT STRL SZ7.5 (GLOVE) ×2 IMPLANT
GLOVE SURG SS PI 6.5 STRL IVOR (GLOVE) ×2 IMPLANT
GLOVE SURG SS PI 7.5 STRL IVOR (GLOVE) ×2 IMPLANT
GLOVE SURG SS PI 8.5 STRL IVOR (GLOVE) ×1
GLOVE SURG SS PI 8.5 STRL STRW (GLOVE) ×1 IMPLANT
GOWN STRL REUS W/ TWL LRG LVL3 (GOWN DISPOSABLE) ×1 IMPLANT
GOWN STRL REUS W/ TWL XL LVL3 (GOWN DISPOSABLE) ×4 IMPLANT
GOWN STRL REUS W/TWL LRG LVL3 (GOWN DISPOSABLE) ×1
GOWN STRL REUS W/TWL XL LVL3 (GOWN DISPOSABLE) ×4
HANDPIECE INTERPULSE COAX TIP (DISPOSABLE)
KIT BASIN OR (CUSTOM PROCEDURE TRAY) ×2 IMPLANT
KIT ROOM TURNOVER OR (KITS) ×2 IMPLANT
MANIFOLD NEPTUNE II (INSTRUMENTS) ×2 IMPLANT
NS IRRIG 1000ML POUR BTL (IV SOLUTION) ×2 IMPLANT
PACK TOTAL JOINT (CUSTOM PROCEDURE TRAY) ×2 IMPLANT
PACK UNIVERSAL I (CUSTOM PROCEDURE TRAY) ×2 IMPLANT
PAD ARMBOARD 7.5X6 YLW CONV (MISCELLANEOUS) ×4 IMPLANT
SET HNDPC FAN SPRY TIP SCT (DISPOSABLE) IMPLANT
SPONGE LAP 18X18 X RAY DECT (DISPOSABLE) ×2 IMPLANT
STAPLER VISISTAT 35W (STAPLE) ×2 IMPLANT
SUT ETHILON 2 0 FS 18 (SUTURE) ×6 IMPLANT
SUT VIC AB 0 CT1 27 (SUTURE) ×2
SUT VIC AB 0 CT1 27XBRD ANBCTR (SUTURE) ×2 IMPLANT
SUT VIC AB 1 CTB1 27 (SUTURE) ×4 IMPLANT
SUT VIC AB 2-0 CT1 27 (SUTURE) ×2
SUT VIC AB 2-0 CT1 TAPERPNT 27 (SUTURE) ×2 IMPLANT
SUT VICRYL 0 CT 1 36IN (SUTURE) IMPLANT
TIP HIGH FLOW IRRIGATION COAX (MISCELLANEOUS) ×4 IMPLANT
TOWEL OR 17X24 6PK STRL BLUE (TOWEL DISPOSABLE) ×2 IMPLANT
TOWEL OR 17X26 10 PK STRL BLUE (TOWEL DISPOSABLE) ×2 IMPLANT
TUBE ANAEROBIC SPECIMEN COL (MISCELLANEOUS) IMPLANT
UNDERPAD 30X30 INCONTINENT (UNDERPADS AND DIAPERS) ×2 IMPLANT
WATER STERILE IRR 1000ML POUR (IV SOLUTION) ×2 IMPLANT
YANKAUER SUCT BULB TIP NO VENT (SUCTIONS) ×2 IMPLANT

## 2015-11-10 NOTE — H&P (Signed)
Marilyn [name] is an 45 y.o. female.   Chief Complaint:   Left hip incision pain and wound breakdown HPI:   45 yo morbidly obese female who underwent a left anterior total hip replacement in December.  Presented to the office a week ago with a small opening at her incision with some drainage.  The wound was cleaned and she was started on antibiotics for what looked like a superficial suture abscess.  The continues to complain of incisional pain and concern about something deeper going on.  Cultures from the office show sensitive Staph.  I have recommended an irrigation and debridement of the superficial tissues around her hip incision.  Past Medical History  Diagnosis Date  . Hypertension   . Arthritis   . Asthma   . Hypothyroidism     takes levothyroxen  . Shortness of breath     with exertion  . Headache(784.0)     HX OF MIGRAINES  . OSA on CPAP   . Anginal pain (HCC)     pt states experiences chest wall pain pt states related to her asthma   . History of bronchitis   . Anxiety     with MRI's  . GERD (gastroesophageal reflux disease)   . Anemia     taking iron now. pt states having no current issues 09/02/2015  . Wears glasses   . Dizziness     Past Surgical History  Procedure Laterality Date  . Cesarean section      times 2  . Cholecystectomy    . Knee arthroscopy    . Rotator cuff repair      left   . Joint replacement  2011    total left knee  . Tubal ligation  1996  . Knee arthroplasty  04/23/2012    right   . Total knee arthroplasty  04/23/2012    Procedure: TOTAL KNEE ARTHROPLASTY;  Surgeon: [name], MD;  Location: [facility];  Service: Orthopedics;  Laterality: Right;  Right Total Knee Arthroplasty  . Endometrial ablation    . Shoulder surgery      right to repair ligament tear  . Total hip arthroplasty Left 09/09/2015    Procedure: LEFT TOTAL HIP ARTHROPLASTY ANTERIOR APPROACH;  Surgeon: Kathryne Hitch, MD;  Location: [facility];  Service: Orthopedics;   Laterality: Left;    Family History  Problem Relation Age of Onset  . Cancer Mother     colon  . Epilepsy Mother   . Cancer Father     prostate  . Diabetes Father   . Hypertension Father   . Hypertension Maternal Aunt   . Diabetes Maternal Aunt   . Hypertension Paternal Aunt    Social History:  reports that she quit smoking about 24 years ago. Her smoking use included Cigarettes. She has a 1 pack-year smoking history. She has never used smokeless tobacco. She reports that she does not drink alcohol or use illicit drugs.  Allergies:  Allergies  Allergen Reactions  . Lisinopril Anaphylaxis  . Bee Venom Swelling  . Codeine Other (See Comments)    Upset stomach  . Darvocet [Propoxyphene N-Acetaminophen] Hives    Tolerates tylenol  . Meloxicam     Diarrhea, insomnia, constipation  . Latex Rash  . Tomato Rash    Medications Prior to Admission  Medication Sig Dispense Refill  . amLODipine (NORVASC) 5 MG tablet Take 5 mg by mouth daily.     . cyclobenzaprine (FLEXERIL) 10 MG tablet Take  1 tablet (10 mg total) by mouth 3 (three) times daily as needed for muscle spasms. 30 tablet 0  . diphenhydrAMINE (BENADRYL) 25 MG tablet Take 1 tablet (25 mg total) by mouth every 6 (six) hours. (Patient taking differently: Take 25 mg by mouth at bedtime as needed for itching or allergies. ) 12 tablet 0  . escitalopram (LEXAPRO) 10 MG tablet Take 10 mg by mouth at bedtime.    . hydrochlorothiazide 25 MG tablet Take 25 mg by mouth daily.     [name] levothyroxine (SYNTHROID, LEVOTHROID) 100 MCG tablet Take 100 mcg by mouth daily before breakfast.    . oxybutynin (DITROPAN-XL) 10 MG 24 hr tablet Take 10 mg by mouth daily.     [name] oxyCODONE-acetaminophen (PERCOCET) 10-325 MG tablet Take 1-2 tablets by mouth every 4 (four) hours as needed for pain.   0  . albuterol (PROVENTIL HFA;VENTOLIN HFA) 108 (90 BASE) MCG/ACT inhaler Inhale 2 puffs into the lungs every 6 (six) hours as needed for wheezing or shortness of  breath.    . cetirizine (ZYRTEC) 10 MG tablet Take 10 mg by mouth daily as needed for allergies.    . naproxen (NAPROSYN) 500 MG tablet Take 1 tablet (500 mg total) by mouth 2 (two) times daily. (Patient taking differently: Take 500 mg by mouth 2 (two) times daily as needed for mild pain. ) 30 tablet 0  . polyethylene glycol powder (GLYCOLAX/MIRALAX) powder Take 17 g by mouth daily as needed for mild constipation.       Results for orders placed or performed during the hospital encounter of 11/10/15 (from the past 48 hour(s))  I-STAT 4, (NA,K, GLUC, HGB,HCT)     Status: Abnormal   Collection Time: 11/10/15  3:49 PM  Result Value Ref Range   Sodium 137 135 - 145 mmol/L   Potassium 3.5 3.5 - 5.1 mmol/L   Glucose, Bld 85 65 - 99 mg/dL   HCT 19.1 (L) 47.8 - 29.5 %   Hemoglobin 11.2 (L) 12.0 - 15.0 g/dL   No results found.  Review of Systems  All other systems reviewed and are negative.   Blood pressure 134/66, pulse 76, temperature 97.3 F (36.3 C), temperature source Oral, resp. rate 18, height  (1.6 m), weight 133.811 kg (295 lb), SpO2 100 %. Physical Exam  Constitutional: She is oriented to person, place, and time. She appears well-developed and well-nourished.  HENT:  Head: Normocephalic and atraumatic.  Eyes: EOM are normal. Pupils are equal, round, and reactive to light.  Neck: Normal range of motion. Neck supple.  Cardiovascular: Normal rate and regular rhythm.   Respiratory: Effort normal and breath sounds normal.  GI: Soft. Bowel sounds are normal.  Musculoskeletal:       Legs: Neurological: She is alert and oriented to person, place, and time.  Skin: Skin is warm and dry.  Psychiatric: She has a normal mood and affect.     Assessment/Plan Left hip incision with small dehiscence and questionable superficial infection 1)  To the OR for an irrigation and debridement of her left hip incision.  Kathryne Hitch, MD 11/10/2015, 5:14 PM

## 2015-11-10 NOTE — Anesthesia Procedure Notes (Signed)
Procedure Name: Intubation Date/Time: 11/10/2015 5:24 PM Performed by: Little Ishikawa L Pre-anesthesia Checklist: Patient identified, Timeout performed, Emergency Drugs available, Suction available and Patient being monitored Patient Re-evaluated:Patient Re-evaluated prior to inductionOxygen Delivery Method: Circle system utilized Preoxygenation: Pre-oxygenation with 100% oxygen Intubation Type: IV induction Ventilation: Mask ventilation without difficulty Laryngoscope Size: Mac and 3 Grade View: Grade I Tube type: Oral Tube size: 7.0 mm Number of attempts: 1 Airway Equipment and Method: Stylet Placement Confirmation: positive ETCO2,  ETT inserted through vocal cords under direct vision and breath sounds checked- equal and bilateral Secured at: 21 cm Tube secured with: Tape Dental Injury: Teeth and Oropharynx as per pre-operative assessment

## 2015-11-10 NOTE — Anesthesia Preprocedure Evaluation (Signed)
Anesthesia Evaluation  Patient identified by MRN, date of birth, ID band Patient awake    Reviewed: Allergy & Precautions, NPO status , Patient's Chart, lab work & pertinent test results  Airway Mallampati: II  TM Distance: >3 FB Neck ROM: Full    Dental  (+) Dental Advisory Given   Pulmonary asthma , sleep apnea and Continuous Positive Airway Pressure Ventilation , former smoker,    breath sounds clear to auscultation       Cardiovascular hypertension, Pt. on medications  Rhythm:Regular Rate:Normal     Neuro/Psych Anxiety negative neurological ROS     GI/Hepatic Neg liver ROS, GERD  ,  Endo/Other  Hypothyroidism Morbid obesity  Renal/GU negative Renal ROS     Musculoskeletal  (+) Arthritis ,   Abdominal   Peds  Hematology  (+) anemia ,   Anesthesia Other Findings   Reproductive/Obstetrics                             Anesthesia Physical Anesthesia Plan  ASA: III  Anesthesia Plan: General   Post-op Pain Management:    Induction: Intravenous  Airway Management Planned: Oral ETT  Additional Equipment:   Intra-op Plan:   Post-operative Plan: Extubation in OR  Informed Consent: I have reviewed the patients History and Physical, chart, labs and discussed the procedure including the risks, benefits and alternatives for the proposed anesthesia with the patient or authorized representative who has indicated his/her understanding and acceptance.   Dental advisory given  Plan Discussed with: CRNA  Anesthesia Plan Comments:         Anesthesia Quick Evaluation

## 2015-11-10 NOTE — Brief Op Note (Signed)
11/10/2015  6:23 PM  PATIENT:  Marilyn [name]  45 y.o. female  PRE-OPERATIVE DIAGNOSIS:  left hip wound dehiscence, superficial infection  POST-OPERATIVE DIAGNOSIS:  left hip wound dehiscence, superficial infection  PROCEDURE:  Procedure(s): IRRIGATION AND DEBRIDEMENT LEFT HIP INCISION (Left)  SURGEON:  Surgeon(s) and Role:    * Kathryne Hitch, MD - Primary  PHYSICIAN ASSISTANT: Rexene Edison, PA-C  ANESTHESIA:   general  EBL:  Total I/O In: -  Out: 200 [Blood:200]  COUNTS:  YES  DICTATION: .Other Dictation: Dictation Number [id number]  PLAN OF CARE: Admit to inpatient   PATIENT DISPOSITION:  PACU - hemodynamically stable.   Delay start of Pharmacological VTE agent (>24hrs) due to surgical blood loss or risk of bleeding: no

## 2015-11-10 NOTE — Op Note (Signed)
Marilyn, [name]             ACCOUNT NO.:  [id number]  MEDICAL RECORD NO.:  [id number]  LOCATION:  [facility]                         FACILITY:  [facility]  PHYSICIAN:  Vanita Panda. Magnus Ivan, M.D.DATE OF BIRTH:  1970-12-04  DATE OF PROCEDURE:  11/10/2015 DATE OF DISCHARGE:                              OPERATIVE REPORT   PREOPERATIVE DIAGNOSIS:  Right hip wound dehiscence with questionable superficial infection.  POSTOPERATIVE DIAGNOSIS:  Right hip superficial abscess with adipose tissue necrosis.  FINDINGS:  Adipose tissue necrosis of the left hip superficial to the deep tissues with obvious abscess.  SURGEON:  Vanita Panda. Magnus Ivan, MD  ASSISTANT:  Richardean Canal, PA-C  ANESTHESIA:  General.  ANTIBIOTICS:  3 g IV Ancef.  BLOOD LOSS:  200 mL.  COMPLICATIONS:  None.  INDICATIONS:  [name] is a morbidly obese 45 year old, who in late December, I believe underwent what we felt was successful left total hip arthroplasty.  We watched her wound carefully and she seemed to do well. However about 2 weeks ago, she noted a small area of drainage from her incision.  We felt this may have been superficial abscess and we put her on antibiotics, but did obtain cultures and it grew out a sensitive Staph.  The skin is now indurated around this area.  I could see no gross purulence, but is definitely painful to her and there is a slight hint of redness, so at this point, an open irrigation debridement is warranted in an attempt to salvage the hip joint.  PROCEDURE DESCRIPTION:  After informed consent was obtained, appropriate left hip was marked.  She was brought to the operating room and general anesthesia was obtained while she was on her stretcher.  She was then placed supine on the Hana fracture table with the perineal post in place and both legs in inline skeletal traction devices, but no traction applied.  Her left operative hip was prepped and draped with Betadine scrub and  paint and sterile drapes.  Time-out was called.  She was identified as correct patient, correct left hip.  I then ellipsed out her previous incision and this seems I got into the adipose tissue.  I did find adipose tissue necrosis and obvious what appeared to be abscess.  I was able to use a sharp knife to debride just a little bit of necrotic adipose tissue, but otherwise there was no other necrotic tissue.  I do not feel any opening of our closed tensor fascia at all. I then used 3 L of normal saline solution using pulsatile lavage and cleaned out the superficial tissues.  I did open the deep tissues to see if there was any fluid in the joint, there was no fluid joint and no communication.  I still then irrigated the joint itself with additional 3 L of normal saline solution after changing out instrumentation for cleaner instruments.  I then closed the iliotibial band with #1 Vicryl suture followed by 0 Vicryl in the deep tissue, 2-0 Vicryl in the subcutaneous tissue, interrupted nylon sutures on the skin.  Xeroform and Aquacel dressing applied and she was taken off the Hana table and taken to recovery room in stable condition.  All final  counts were correct.  There were no complications noted.  Postoperatively, we are going to admit her for IV antibiotics with likely a PICC line and 6 weeks of IV antibiotics.     Vanita Panda. Magnus Ivan, M.D.     CYB/MEDQ  D:  11/10/2015  T:  11/10/2015  Job:  [id number]

## 2015-11-10 NOTE — Transfer of Care (Signed)
Immediate Anesthesia Transfer of Care Note  Patient: Marilyn [name]  Procedure(s) Performed: Procedure(s): IRRIGATION AND DEBRIDEMENT LEFT HIP INCISION (Left)  Patient Location: PACU  Anesthesia Type:General  Level of Consciousness: awake  Airway & Oxygen Therapy: Patient Spontanous Breathing and Patient connected to nasal cannula oxygen  Post-op Assessment: Report given to RN and Post -op Vital signs reviewed and stable  Post vital signs: stable  Last Vitals:  Filed Vitals:   11/10/15 1521 11/10/15 1902  BP: 134/66 148/94  Pulse:    Temp: 36.3 C 36.7 C  Resp:  12    Complications: No apparent anesthesia complications

## 2015-11-10 NOTE — Anesthesia Postprocedure Evaluation (Signed)
Anesthesia Post Note  Patient: Marilyn [name]  Procedure(s) Performed: Procedure(s) (LRB): IRRIGATION AND DEBRIDEMENT LEFT HIP INCISION (Left)  Patient location during evaluation: PACU Anesthesia Type: General Level of consciousness: awake and alert, oriented and patient cooperative Pain management: pain level controlled Vital Signs Assessment: post-procedure vital signs reviewed and stable Respiratory status: spontaneous breathing, nonlabored ventilation, respiratory function stable and patient connected to nasal cannula oxygen Cardiovascular status: blood pressure returned to baseline and stable Postop Assessment: no signs of nausea or vomiting Anesthetic complications: no    Last Vitals:  Filed Vitals:   11/10/15 1930 11/10/15 1946  BP: 122/76 138/73  Pulse: 79 77  Temp:  36.6 C  Resp: 16 16    Last Pain:  Filed Vitals:   11/10/15 1947  PainSc: Asleep                 Manning Luna,E. Indy [place]

## 2015-11-11 ENCOUNTER — Encounter (HOSPITAL_COMMUNITY): Payer: Self-pay | Admitting: Orthopaedic Surgery

## 2015-11-11 LAB — SEDIMENTATION RATE: SED RATE: 122 mm/h — AB (ref 0–22)

## 2015-11-11 LAB — BASIC METABOLIC PANEL
ANION GAP: 7 (ref 5–15)
BUN: 6 mg/dL (ref 6–20)
CALCIUM: 8.4 mg/dL — AB (ref 8.9–10.3)
CHLORIDE: 104 mmol/L (ref 101–111)
CO2: 26 mmol/L (ref 22–32)
CREATININE: 1.02 mg/dL — AB (ref 0.44–1.00)
GFR calc Af Amer: >60 mL/min (ref >60)
GFR calc non Af Amer: >60 mL/min (ref >60)
GLUCOSE: 158 mg/dL — AB (ref 65–99)
POTASSIUM: 3.9 mmol/L (ref 3.5–5.1)
Sodium: 137 mmol/L (ref 135–145)

## 2015-11-11 LAB — C-REACTIVE PROTEIN: CRP: 11.2 mg/dL — AB (ref <1.0)

## 2015-11-11 LAB — CBC
HEMATOCRIT: 24.3 % — AB (ref 36.0–46.0)
HEMOGLOBIN: 7.4 g/dL — AB (ref 12.0–15.0)
MCH: 24.6 pg — AB (ref 26.0–34.0)
MCHC: 30.5 g/dL (ref 30.0–36.0)
MCV: 80.7 fL (ref 78.0–100.0)
PLATELETS: 378 10*3/uL (ref 150–400)
RBC: 3.01 MIL/uL — AB (ref 3.87–5.11)
RDW: 15 % (ref 11.5–15.5)
WBC: 8.4 10*3/uL (ref 4.0–10.5)

## 2015-11-11 NOTE — Progress Notes (Signed)
Utilization review completed. Nomi Rudnicki, RN, BSN. 

## 2015-11-11 NOTE — Progress Notes (Signed)
Patient ID: Marilyn [name], female   DOB: 16-Dec-1970, 45 y.o.   MRN: [id number] No acute changes.  Will continue IV antibiotics through out the weekend for her soft-tissue infection.

## 2015-11-12 LAB — CBC
HCT: 24.5 % — ABNORMAL LOW (ref 36.0–46.0)
Hemoglobin: 7.2 g/dL — ABNORMAL LOW (ref 12.0–15.0)
MCH: 24.2 pg — ABNORMAL LOW (ref 26.0–34.0)
MCHC: 29.4 g/dL — AB (ref 30.0–36.0)
MCV: 82.5 fL (ref 78.0–100.0)
PLATELETS: 395 10*3/uL (ref 150–400)
RBC: 2.97 MIL/uL — ABNORMAL LOW (ref 3.87–5.11)
RDW: 15.3 % (ref 11.5–15.5)
WBC: 8.2 10*3/uL (ref 4.0–10.5)

## 2015-11-12 NOTE — Progress Notes (Signed)
Patient ID: Marilyn [name], female   DOB: 19-Apr-1971, 45 y.o.   MRN: [id number] Postoperative day 1 status post irrigation and debridement left hip. Patient is scheduled for a PICC line and anticipate discharge on Monday.

## 2015-11-13 NOTE — Progress Notes (Signed)
Patient ID: Marilyn [name], female   DOB: January 02, 1971, 45 y.o.   MRN: [id number] Patient is sitting in a chair and comfortable. She states she has a little bit of serosanguineous drainage. Plan for discharge possibly on Monday. Dr. Magnus Ivan to determine need for IV antibiotics or not.

## 2015-11-14 MED ORDER — CEPHALEXIN 500 MG PO CAPS: 500.0000 mg | ORAL_CAPSULE | Freq: Four times a day (QID) | ORAL | Status: DC

## 2015-11-14 MED ORDER — CIPROFLOXACIN HCL 500 MG PO TABS: 500.0000 mg | ORAL_TABLET | Freq: Two times a day (BID) | ORAL | Status: DC

## 2015-11-14 MED ORDER — OXYCODONE-ACETAMINOPHEN 10-325 MG PO TABS: 1.0000 | ORAL_TABLET | Freq: Four times a day (QID) | ORAL | Status: DC | PRN

## 2015-11-14 NOTE — Progress Notes (Signed)
Pt to be discharged to home today. Went over discharge instructions and prescriptions with patient. Answered any Questions or concerns pt had. IV d/c.

## 2015-11-14 NOTE — Discharge Instructions (Signed)
You can get your current dressing wet daily in the shower. You can change your dressing if it gets too soaked.

## 2015-11-14 NOTE — Care Management Important Message (Signed)
Important Message  Patient Details  Name: GWENN TEODORO MRN: [id number] Date of Birth: March 21, 1971   Medicare Important Message Given:  Yes    Oralia Rud Zita Ozimek 11/14/2015, 4:46 PM

## 2015-11-14 NOTE — Discharge Summary (Signed)
Patient ID: Marilyn [name] MRN: [id number] DOB/AGE: 47-Mar-1972 47 y.o.  Admit date: 11/10/2015 Discharge date: 11/14/2015  Admission Diagnoses:  Principal Problem:   Left hip postoperative wound infection Active Problems:   Surgical wound dehiscence left hip; questionable superficial infection   Discharge Diagnoses:  Same  Past Medical History  Diagnosis Date  . Hypertension   . Arthritis   . Asthma   . Hypothyroidism     takes levothyroxen  . Shortness of breath     with exertion  . Headache(784.0)     HX OF MIGRAINES  . OSA on CPAP   . Anginal pain (HCC)     pt states experiences chest wall pain pt states related to her asthma   . History of bronchitis   . Anxiety     with MRI's  . GERD (gastroesophageal reflux disease)   . Anemia     taking iron now. pt states having no current issues 09/02/2015  . Wears glasses   . Dizziness     Surgeries: Procedure(s): IRRIGATION AND DEBRIDEMENT LEFT HIP INCISION on 11/10/2015   Consultants:    Discharged Condition: Improved  Hospital Course: Marilyn [name] is an 45 y.o. female who was admitted 11/10/2015 for operative treatment ofLeft hip postoperative wound infection. Patient has severe unremitting pain that affects sleep, daily activities, and work/hobbies. After pre-op clearance the patient was taken to the operating room on 11/10/2015 and underwent  Procedure(s): IRRIGATION AND DEBRIDEMENT LEFT HIP INCISION.    Patient was given perioperative antibiotics: Anti-infectives    Start     Dose/Rate Route Frequency Ordered Stop   11/14/15 0000  cephALEXin (KEFLEX) 500 MG capsule     500 mg Oral 4 times daily 11/14/15 0750     11/14/15 0000  ciprofloxacin (CIPRO) 500 MG tablet     500 mg Oral 2 times daily 11/14/15 0750     11/11/15 0000  ceFAZolin (ANCEF) IVPB 2 g/50 mL premix     2 g 100 mL/hr over 30 Minutes Intravenous 3 times per day 11/10/15 2006 11/13/15 1530   11/10/15 1515  ceFAZolin (ANCEF) 3 g in dextrose 5  % 50 mL IVPB     3 g 130 mL/hr over 30 Minutes Intravenous To [facility] 11/10/15 1455 11/10/15 1740       Patient was given sequential compression devices, early ambulation, and chemoprophylaxis to prevent DVT.  Patient benefited maximally from hospital stay and there were no complications.    Recent vital signs: Patient Vitals for the past 24 hrs:  BP Temp Temp src Pulse Resp SpO2  11/14/15 0600 127/62 mmHg 98 F (36.7 C) Oral 73 16 99 %  11/13/15 2219 (!) 134/50 mmHg 98 F (36.7 C) Oral 77 - 100 %  11/13/15 1400 136/68 mmHg 99 F (37.2 C) Oral 75 18 100 %     Recent laboratory studies:  Recent Labs  11/12/15 0531  WBC 8.2  HGB 7.2*  HCT 24.5*  PLT 395     Discharge Medications:     Medication List    TAKE these medications        albuterol 108 (90 Base) MCG/ACT inhaler  Commonly known as:  PROVENTIL HFA;VENTOLIN HFA  Inhale 2 puffs into the lungs every 6 (six) hours as needed for wheezing or shortness of breath.     amLODipine 5 MG tablet  Commonly known as:  NORVASC  Take 5 mg by mouth daily.     cephALEXin 500 MG capsule  Commonly known as:  KEFLEX  Take 1 capsule (500 mg total) by mouth 4 (four) times daily.     cetirizine 10 MG tablet  Commonly known as:  ZYRTEC  Take 10 mg by mouth daily as needed for allergies.     ciprofloxacin 500 MG tablet  Commonly known as:  CIPRO  Take 1 tablet (500 mg total) by mouth 2 (two) times daily.     cyclobenzaprine 10 MG tablet  Commonly known as:  FLEXERIL  Take 1 tablet (10 mg total) by mouth 3 (three) times daily as needed for muscle spasms.     diphenhydrAMINE 25 MG tablet  Commonly known as:  BENADRYL  Take 1 tablet (25 mg total) by mouth every 6 (six) hours.     escitalopram 10 MG tablet  Commonly known as:  LEXAPRO  Take 10 mg by mouth at bedtime.     hydrochlorothiazide 25 MG tablet  Commonly known as:  HYDRODIURIL  Take 25 mg by mouth daily.     levothyroxine 100 MCG tablet  Commonly  known as:  SYNTHROID, LEVOTHROID  Take 100 mcg by mouth daily before breakfast.     naproxen 500 MG tablet  Commonly known as:  NAPROSYN  Take 1 tablet (500 mg total) by mouth 2 (two) times daily.     oxybutynin 10 MG 24 hr tablet  Commonly known as:  DITROPAN-XL  Take 10 mg by mouth daily.     oxyCODONE-acetaminophen 10-325 MG tablet  Commonly known as:  PERCOCET  Take 1-2 tablets by mouth every 6 (six) hours as needed for pain.     polyethylene glycol powder powder  Commonly known as:  GLYCOLAX/MIRALAX  Take 17 g by mouth daily as needed for mild constipation.        Diagnostic Studies: No results found.  Disposition: 01-Home or Self Care      Discharge Instructions    Discharge patient    Complete by:  As directed            Follow-up Information    Follow up with Kathryne Hitch, MD. Schedule an appointment as soon as possible for a visit in 2 weeks.   Specialty:  Orthopedic Surgery   Contact information:   [address] [place] [address] [phone]        Signed: Kathryne Hitch 11/14/2015, 7:50 AM

## 2015-11-14 NOTE — Progress Notes (Signed)
Patient ID: Marilyn [name], female   DOB: 10-26-1970, 45 y.o.   MRN: [id number] Feels better and looks better overall.  Skin not red over incision and dressing changed.  Can be discharged to home today.

## 2015-12-06 ENCOUNTER — Other Ambulatory Visit: Payer: Self-pay

## 2015-12-06 DIAGNOSIS — Z1231 Encounter for screening mammogram for malignant neoplasm of breast: Secondary | ICD-10-CM

## 2015-12-11 ENCOUNTER — Encounter (HOSPITAL_COMMUNITY): Payer: Self-pay | Admitting: Emergency Medicine

## 2015-12-11 ENCOUNTER — Emergency Department (HOSPITAL_COMMUNITY)
Admission: EM | Admit: 2015-12-11 | Discharge: 2015-12-11 | Disposition: A | Discharge status: Home / Self Care | Payer: Medicare Other | Attending: Emergency Medicine | Admitting: Emergency Medicine

## 2015-12-11 ENCOUNTER — Emergency Department (HOSPITAL_COMMUNITY): Payer: Medicare Other

## 2015-12-11 DIAGNOSIS — Z792 Long term (current) use of antibiotics: Secondary | ICD-10-CM | POA: Diagnosis not present

## 2015-12-11 DIAGNOSIS — M25552 Pain in left hip: Secondary | ICD-10-CM | POA: Insufficient documentation

## 2015-12-11 DIAGNOSIS — Z87891 Personal history of nicotine dependence: Secondary | ICD-10-CM | POA: Diagnosis not present

## 2015-12-11 DIAGNOSIS — Z79899 Other long term (current) drug therapy: Secondary | ICD-10-CM | POA: Insufficient documentation

## 2015-12-11 DIAGNOSIS — I10 Essential (primary) hypertension: Secondary | ICD-10-CM | POA: Insufficient documentation

## 2015-12-11 DIAGNOSIS — Z96642 Presence of left artificial hip joint: Secondary | ICD-10-CM | POA: Diagnosis not present

## 2015-12-11 DIAGNOSIS — E039 Hypothyroidism, unspecified: Secondary | ICD-10-CM | POA: Insufficient documentation

## 2015-12-11 DIAGNOSIS — M1990 Unspecified osteoarthritis, unspecified site: Secondary | ICD-10-CM | POA: Diagnosis not present

## 2015-12-11 DIAGNOSIS — M25511 Pain in right shoulder: Secondary | ICD-10-CM

## 2015-12-11 MED ORDER — KETOROLAC TROMETHAMINE 60 MG/2ML IM SOLN
30.0000 mg | Freq: Once | INTRAMUSCULAR | Status: AC
  Administered 2015-12-11: 30 mg via INTRAMUSCULAR
  Filled 2015-12-11: qty 2

## 2015-12-11 NOTE — ED Notes (Addendum)
Pt states she had right shoulder surgery in February and she is having pain with no injury.  States she has had a lot of complications with this surgery and her hip replacement.

## 2015-12-11 NOTE — Discharge Instructions (Signed)
Watch out for fevers. Which of her redness of the joint. Follow-up in 2 days as planned. You have run out of the medicines from your pain management doctor several days early. Follow up with him.

## 2015-12-11 NOTE — ED Notes (Signed)
MD at bedside. 

## 2015-12-11 NOTE — ED Provider Notes (Signed)
CSN: [id number]     Arrival date & time 12/11/15  1539 History   First MD Initiated Contact with Patient 12/11/15 1553     Chief Complaint  Patient presents with  . Shoulder Pain      Patient is a 45 y.o. female presenting with shoulder pain. The history is provided by the patient.  Shoulder Pain Patient had acute on chronic pain in her right shoulder. States that around 6 weeks ago Dr. Lajoyce Corners did outpatient surgery and had some rotator cuff surgery. States she's had some pain since. States it has been bothering her but she's had more problems with her left hip so she had to put that on the back burner. States now she's been getting more pain in the right shoulder. States it goes down the arm. No fevers. States that the pain is unrelieved with her 15 mg oxycodone that she gets her pain clinic. No fall. No new trauma. Patient states later on questioning that she has run out of her pain pills. States that she ran out around for 5 days ago. She states that she's had to take more because the pain was so severe. Picked up 27 days ago. She said that she has follow-up with her orthopedic surgeon in 2 days. No nausea vomiting or diarrhea.  Past Medical History  Diagnosis Date  . Hypertension   . Arthritis   . Asthma   . Hypothyroidism     takes levothyroxen  . Shortness of breath     with exertion  . Headache(784.0)     HX OF MIGRAINES  . OSA on CPAP   . Anginal pain (HCC)     pt states experiences chest wall pain pt states related to her asthma   . History of bronchitis   . Anxiety     with MRI's  . GERD (gastroesophageal reflux disease)   . Anemia     taking iron now. pt states having no current issues 09/02/2015  . Wears glasses   . Dizziness    Past Surgical History  Procedure Laterality Date  . Cesarean section      times 2  . Cholecystectomy    . Knee arthroscopy    . Rotator cuff repair      left   . Joint replacement  2011    total left knee  . Tubal ligation  1996  . Knee  arthroplasty  04/23/2012    right   . Total knee arthroplasty  04/23/2012    Procedure: TOTAL KNEE ARTHROPLASTY;  Surgeon: [name], MD;  [facility];  Service: Orthopedics;  Laterality: Right;  Right Total Knee Arthroplasty  . Endometrial ablation    . Shoulder surgery      right to repair ligament tear  . Total hip arthroplasty Left 09/09/2015    Procedure: LEFT TOTAL HIP ARTHROPLASTY ANTERIOR APPROACH;  Surgeon: Kathryne Hitch, MD;  [facility];  Service: Orthopedics;  Laterality: Left;  . Incision and drainage hip Left 11/10/2015    Procedure: IRRIGATION AND DEBRIDEMENT LEFT HIP INCISION;  Surgeon: Kathryne Hitch, MD;  [facility];  Service: Orthopedics;  Laterality: Left;   Family History  Problem Relation Age of Onset  . Cancer Mother     colon  . Epilepsy Mother   . Cancer Father     prostate  . Diabetes Father   . Hypertension Father   . Hypertension Maternal Aunt   . Diabetes Maternal Aunt   .  Hypertension Paternal Aunt    Social History  Substance Use Topics  . Smoking status: Former Smoker -- 0.25 packs/day for 4 years    Types: Cigarettes    Quit date: 09/18/1991  . Smokeless tobacco: Never Used  . Alcohol Use: No   OB History    Gravida Para Term Preterm AB TAB SAB Ectopic Multiple Living   Review of Systems  Constitutional: Negative for appetite change.  Respiratory: Negative for shortness of breath.   Cardiovascular: Negative for chest pain.  Musculoskeletal:       Right shoulder pain and left hip pain.  Skin: Negative for wound.  Neurological: Negative for weakness and numbness.      Allergies  Lisinopril; Bee venom; Codeine; Darvocet; Meloxicam; Latex; and Tomato  Home Medications   Prior to Admission medications   Medication Sig Start Date End Date Taking? Authorizing Provider  amLODipine (NORVASC) 5 MG tablet Take 5 mg by mouth daily.    Yes Historical Provider, MD  Ascorbic Acid (VITAMIN C  PO) Take 1 tablet by mouth daily.   Yes Historical Provider, MD  cephALEXin (KEFLEX) 500 MG capsule Take 1 capsule (500 mg total) by mouth 4 (four) times daily. 11/14/15  Yes Kathryne Hitch, MD  cetirizine (ZYRTEC) 10 MG tablet Take 10 mg by mouth daily as needed for allergies.   Yes Historical Provider, MD  ciprofloxacin (CIPRO) 500 MG tablet Take 1 tablet (500 mg total) by mouth 2 (two) times daily. 11/14/15  Yes Kathryne Hitch, MD  cyclobenzaprine (FLEXERIL) 10 MG tablet Take 1 tablet (10 mg total) by mouth 3 (three) times daily as needed for muscle spasms. 09/12/15  Yes Kathryne Hitch, MD  diphenhydrAMINE (BENADRYL) 25 MG tablet Take 1 tablet (25 mg total) by mouth every 6 (six) hours. Patient taking differently: Take 25 mg by mouth at bedtime as needed for itching or allergies.  09/29/15  Yes Azalia Bilis, MD  escitalopram (LEXAPRO) 10 MG tablet Take 10 mg by mouth at bedtime.   Yes Historical Provider, MD  hydrochlorothiazide 25 MG tablet Take 25 mg by mouth daily.    Yes Historical Provider, MD  levothyroxine (SYNTHROID, LEVOTHROID) 100 MCG tablet Take 100 mcg by mouth daily before breakfast.   Yes Historical Provider, MD  naproxen sodium (ALEVE) 220 MG tablet Take 440-660 mg by mouth 2 (two) times daily as needed (Pain).   Yes Historical Provider, MD  oxybutynin (DITROPAN-XL) 10 MG 24 hr tablet Take 10 mg by mouth daily.    Yes Historical Provider, MD  oxyCODONE (ROXICODONE) 15 MG immediate release tablet Take 15 mg by mouth every 4 (four) hours as needed for pain.   Yes Historical Provider, MD  albuterol (PROVENTIL HFA;VENTOLIN HFA) 108 (90 BASE) MCG/ACT inhaler Inhale 2 puffs into the lungs every 6 (six) hours as needed for wheezing or shortness of breath.    Historical Provider, MD  naproxen (NAPROSYN) 500 MG tablet Take 1 tablet (500 mg total) by mouth 2 (two) times daily. Patient taking differently: Take 500 mg by mouth 2 (two) times daily as needed for mild pain.   10/06/15   Burgess Amor, PA-C  oxyCODONE-acetaminophen (PERCOCET) 10-325 MG tablet Take 1-2 tablets by mouth every 6 (six) hours as needed for pain. 11/14/15   Kathryne Hitch, MD  polyethylene glycol powder (GLYCOLAX/MIRALAX) powder Take 17 g by mouth daily as needed for mild constipation.  09/26/15  Historical Provider, MD   BP 175/86 mmHg  Pulse 70  Temp(Src) 98.2 F (36.8 C) (Oral)  Resp 18  Ht 5\' 3"  (1.6 m)  Wt 297 lb (134.718 kg)  BMI 52.62 kg/m2  SpO2 100% Physical Exam  Constitutional: She appears well-developed.  Patient is obese  HENT:  Head: Atraumatic.  Abdominal: Soft.  Musculoskeletal: She exhibits tenderness.  Tenderness over right shoulder, particularly anteriorly. No real swelling to upper extremity. neurovascularly intact in right hand. Some pain with range of motion. Not irritable joint.  Skin: Skin is warm.  No rash over right shoulder.    ED Course  Procedures (including critical care time) Labs Review Labs Reviewed - No data to display  Imaging Review Dg Shoulder Right  12/11/2015  CLINICAL DATA:  Status post right shoulder surgery 11/01/2015. Pain has worsened since the procedure. Initial encounter. EXAM: RIGHT SHOULDER - 2+ VIEW COMPARISON:  MRI right shoulder 05/19/2013.  Plain films 07/24/2015. FINDINGS: No acute bony or joint abnormality is identified. Moderate acromioclavicular degenerative change is seen. Imaged right lung and ribs appear normal. IMPRESSION: No acute abnormality. Moderate acromioclavicular osteoarthritis. Electronically Signed   By: Drusilla Kannerhomas  Dalessio M.D.   On: 12/11/2015 16:28   Mm Digital Screening Bilateral  12/12/2015  CLINICAL DATA:  Screening. EXAM: DIGITAL SCREENING BILATERAL MAMMOGRAM WITH CAD COMPARISON:  Previous exam(s). ACR Breast Density Category a: The breast tissue is almost entirely fatty. FINDINGS: There are no findings suspicious for malignancy. Images were processed with CAD. IMPRESSION: No mammographic evidence of  malignancy. A result letter of this screening mammogram will be mailed directly to the patient. RECOMMENDATION: Screening mammogram in one year. (Code:SM-B-01Y) BI-RADS CATEGORY  1: Negative. Electronically Signed   By: Britta MccreedySusan  Turner M.D.   On: 12/12/2015 15:26   I have personally reviewed and evaluated these images and lab results as part of my medical decision-making.   EKG Interpretation None      MDM   Final diagnoses:  Shoulder joint pain, right    Patient with shoulder pain. Chronic. Does not appear to have irritable shoulder. Doubt infection. Pain likely exacerbated by the fact that she ran out of her chronic pain meds around 5 days ago. Will discharge home.    Benjiman CoreNathan Jmarion Christiano, MD 12/13/15 1459

## 2015-12-12 ENCOUNTER — Ambulatory Visit
Admission: RE | Admit: 2015-12-12 | Discharge: 2015-12-12 | Disposition: A | Discharge status: Home / Self Care | Payer: Medicare Other | Source: Ambulatory Visit

## 2015-12-12 DIAGNOSIS — Z1231 Encounter for screening mammogram for malignant neoplasm of breast: Secondary | ICD-10-CM

## 2016-01-20 ENCOUNTER — Other Ambulatory Visit: Payer: Self-pay | Admitting: Orthopaedic Surgery

## 2016-01-20 DIAGNOSIS — M25551 Pain in right hip: Secondary | ICD-10-CM

## 2016-01-28 ENCOUNTER — Ambulatory Visit
Admission: RE | Admit: 2016-01-28 | Discharge: 2016-01-28 | Disposition: A | Discharge status: Home / Self Care | Payer: Medicare Other | Source: Ambulatory Visit | Attending: Orthopaedic Surgery | Admitting: Orthopaedic Surgery

## 2016-01-28 DIAGNOSIS — M25551 Pain in right hip: Secondary | ICD-10-CM

## 2016-03-19 ENCOUNTER — Ambulatory Visit: Payer: Medicare Other | Attending: Orthopaedic Surgery | Admitting: Physical Therapy

## 2016-03-19 DIAGNOSIS — R2689 Other abnormalities of gait and mobility: Secondary | ICD-10-CM | POA: Diagnosis present

## 2016-03-19 DIAGNOSIS — M25552 Pain in left hip: Secondary | ICD-10-CM | POA: Insufficient documentation

## 2016-03-19 DIAGNOSIS — M6281 Muscle weakness (generalized): Secondary | ICD-10-CM | POA: Diagnosis present

## 2016-03-19 NOTE — Therapy (Signed)
[facility] [address] [place], [place], [address] Phone: [phone]   Fax:  [phone]  Physical Therapy Treatment  Patient Details  Name: Marilyn [name] MRN: [id number] Date of Birth: [birth year] Referring Provider: Doneen Poissonhristopher Blackman.  Encounter Date: 03/19/2016      PT End of Session - 03/19/16 1007    Visit Number 1   Number of Visits 16   Date for PT Re-Evaluation 05/18/16   PT Start Time 0903   PT Stop Time 0953   PT Time Calculation (min) 50 min   Activity Tolerance Patient tolerated treatment well   Behavior [facility] Therapy [facility] for tasks assessed/performed      Past Medical History  Diagnosis Date  . Hypertension   . Arthritis   . Asthma   . Hypothyroidism     takes levothyroxen  . Shortness of breath     with exertion  . Headache(784.0)     HX OF MIGRAINES  . OSA on CPAP   . Anginal pain (HCC)     pt states experiences chest wall pain pt states related to her asthma   . History of bronchitis   . Anxiety     with MRI's  . GERD (gastroesophageal reflux disease)   . Anemia     taking iron now. pt states having no current issues 09/02/2015  . Wears glasses   . Dizziness     Past Surgical History  Procedure Laterality Date  . Cesarean section      times 2  . Cholecystectomy    . Knee arthroscopy    . Rotator cuff repair      left   . Joint replacement  2011    total left knee  . Tubal ligation  1996  . Knee arthroplasty  04/23/2012    right   . Total knee arthroplasty  04/23/2012    Procedure: TOTAL KNEE ARTHROPLASTY;  Surgeon: Nadara MustardMarcus V Duda, MD;  Location: [facility];  Service: Orthopedics;  Laterality: Right;  Right Total Knee Arthroplasty  . Endometrial ablation    . Shoulder surgery      right to repair ligament tear  . Total hip arthroplasty Left 09/09/2015    Procedure: LEFT TOTAL HIP ARTHROPLASTY ANTERIOR APPROACH;  Surgeon: Kathryne Hitchhristopher Y Blackman, MD;  Location[facility];  Service: Orthopedics;   Laterality: Left;  . Incision and drainage hip Left 11/10/2015    Procedure: IRRIGATION AND DEBRIDEMENT LEFT HIP INCISION;  Surgeon: Kathryne Hitchhristopher Y Blackman, MD;  Location: [facility];  Service: Orthopedics;  Laterality: Left;    There were no vitals filed for this visit.      Subjective Assessment - 03/19/16 0936    Subjective My left hip hurts and nothing helps.   Limitations Sitting;Standing   How long can you sit comfortably? 20 minutes.   How long can you stand comfortably? 15-20 minutes.   Patient Stated Goals Get out of pain and walk better.   Currently in Pain? Yes   Pain Score 6    Pain Location Hip   Pain Orientation Left   Pain Descriptors / Indicators Aching;Throbbing   Pain Type Surgical pain   Pain Onset More than a month ago   Pain Frequency Constant   Aggravating Factors  Walking; sitting and standing.  cannot lie on left side.   Pain Relieving Factors "Nothing."            [facility] - 03/19/16 0001    Assessment   Medical  Diagnosis Left anterior hip replacement.   Referring Provider Doneen Poissonhristopher Blackman.   Precautions   Precaution Comments No ultrasound.   Restrictions   Weight Bearing Restrictions No   Balance Screen   Has the patient fallen in the past 6 months No   Has the patient had a decrease in activity level because of a fear of falling?  Yes   Is the patient reluctant to leave their home because of a fear of falling?  No   Home [employer] information [employer] managernvironment   Living Environment Private residence   Prior Function   Level of Independence Independent   ROM / Strength   AROM / PROM / Strength AROM;Strength   AROM   Overall AROM Comments Functional left hip ROM.   Strength   Overall Strength Comments Left hip flexion= 3-/5 and left hip= 3-/5.   Palpation   Palpation comment Very tender to palpation over and around the patient's left greater trochanter and left hip incisional site with scar tissue palable.   Bed Mobility   Bed Mobility --  Needs assist to  move left LE.   Ambulation/Gait   Gait Pattern Decreased step length - left;Decreased stance time - left;Trendelenburg;Antalgic                     [facility] Adult PT Treatment/Exercise - 03/19/16 0001    Modalities   Modalities Electrical Stimulation;Iontophoresis   Electrical Stimulation   Electrical Stimulation Location Right lateral hip.   Electrical Stimulation Action IFC   Electrical Stimulation Parameters 80-150 Hz x 15 minutes.   Electrical Stimulation Goals Pain   Iontophoresis   Type of Iontophoresis Dexamethasone   Location Right hip.   Dose 80 mA-Min.                  PT Short Term Goals - 03/19/16 1108    PT SHORT TERM GOAL #1   Title Ind with a comprehensive HEP.   Time 4   Period Weeks   Status New           PT Long Term Goals - 03/19/16 1108    PT LONG TERM GOAL #1   Title Walk without antalgia a community distance.   Time 8   Period Weeks   Status New   PT LONG TERM GOAL #2   Title Increase left hip strength to a solid 4+/5 to increase stability for functional tasks.   Time 8   Period Weeks   Status New   PT LONG TERM GOAL #3   Title Stand 30 minutes with pain not > 3/10.   Time 8   Period Weeks   Status New   PT LONG TERM GOAL #4   Title Sit 30 minutes with pain not > 3/10.   Time 8   Period Weeks   Status New   PT LONG TERM GOAL #5   Title Perform ADL's with left hip pain not > 3/10.   Time 8   Period Weeks   Status New               Plan - 03/19/16 1008    Clinical Impression Statement The patient had a left anterior total hip replacement on 09/09/15.  Unfortunately she developed an infection and had to undergo surgical debridement on 11/10/15.  The pain has worsened and she has increased pain with sitting, standing, walking and her sleep is disturbed by pain.  Her pain-level is a 6/10 today and higher (7-8+/10) with the  aforementioned activities.  Her left hip is weak and very tender in the area of her left  greater trochanter..     Rehab Potential Good   PT Frequency 2x / week   PT Duration 8 weeks   PT Next Visit Plan STW/M to left hip incisional site and greater trochanter; e'stim; Ionto and left hip strengthening.   Consulted and Agree with Plan of Care Patient      Patient will benefit from skilled therapeutic intervention in order to improve the following deficits and impairments:  Pain, Decreased activity tolerance, Decreased strength, Abnormal gait  Visit Diagnosis: Pain in left hip - Plan: PT plan of care cert/re-cert  Muscle weakness (generalized) - Plan: PT plan of care cert/re-cert  Other abnormalities of gait and mobility - Plan: PT plan of care cert/re-cert       G-Codes - Apr 16, 2016 1101    Functional Assessment Tool Used FOTO.Marland KitchenMarland KitchenMarland Kitchen58% limitation.   Functional Limitation Mobility: Walking and moving around   Mobility: Walking and Moving Around Current Status [phone]) At least 40 percent but less than 60 percent impaired, limited or restricted   Mobility: Walking and Moving Around Goal Status [phone]) At least 1 percent but less than 20 percent impaired, limited or restricted      Problem List Patient Active Problem List   Diagnosis Date Noted  . Surgical wound dehiscence left hip; questionable superficial infection 11/10/2015  . Left hip postoperative wound infection 11/10/2015  . Osteoarthritis of left hip 09/09/2015  . 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 Obesity (BMI 35.0-39.9 without comorbidity) (HCC) 04/28/2013    Micahel Omlor, [place] MPT 04/16/16, 11:12 AM  [facility] [address] [place], [place], [address] Phone: [phone]   Fax:  [phone]  Name: Marilyn [name] MRN: [id number] Date of Birth: [birth year]

## 2016-03-22 ENCOUNTER — Ambulatory Visit: Payer: Medicare Other | Admitting: Physical Therapy

## 2016-03-22 DIAGNOSIS — R2689 Other abnormalities of gait and mobility: Secondary | ICD-10-CM

## 2016-03-22 DIAGNOSIS — M25552 Pain in left hip: Secondary | ICD-10-CM | POA: Diagnosis not present

## 2016-03-22 DIAGNOSIS — M6281 Muscle weakness (generalized): Secondary | ICD-10-CM

## 2016-03-22 NOTE — Therapy (Addendum)
[facility] [place], [name], [address] Phone: [phone]   Fax:  [phone]  Physical Therapy Treatment  Patient Details  Name: Marilyn [name] MRN: [id number] Date of Birth: 01/06/1971 Referring Provider: Jean Rosenthal.  Encounter Date: 03/22/2016      PT End of Session - 03/22/16 1210    Visit Number 2   Number of Visits 16   Date for PT Re-Evaluation 05/18/16   PT Start Time 1761   PT Stop Time 1221   PT Time Calculation (min) 57 min   Activity Tolerance Patient tolerated treatment well   Behavior During Therapy [facility] for tasks assessed/performed      Past Medical History  Diagnosis Date  . Hypertension   . Arthritis   . Asthma   . Hypothyroidism     takes levothyroxen  . Shortness of breath     with exertion  . Headache(784.0)     HX OF MIGRAINES  . OSA on CPAP   . Anginal pain ([place])     pt states experiences chest wall pain pt states related to her asthma   . History of bronchitis   . Anxiety     with MRI's  . GERD (gastroesophageal reflux disease)   . Anemia     taking iron now. pt states having no current issues 09/02/2015  . Wears glasses   . Dizziness     Past Surgical History  Procedure Laterality Date  . Cesarean section      times 2  . Cholecystectomy    . Knee arthroscopy    . Rotator cuff repair      left   . Joint replacement  2011    total left knee  . Tubal ligation  1996  . Knee arthroplasty  04/23/2012    right   . Total knee arthroplasty  04/23/2012    Procedure: TOTAL KNEE ARTHROPLASTY;  Surgeon: Newt Minion, MD;  Location: [place];  Service: Orthopedics;  Laterality: Right;  Right Total Knee Arthroplasty  . Endometrial ablation    . Shoulder surgery      right to repair ligament tear  . Total hip arthroplasty Left 09/09/2015    Procedure: LEFT TOTAL HIP ARTHROPLASTY ANTERIOR APPROACH;  Surgeon: Mcarthur Rossetti, MD;  [facility];  Service: Orthopedics;   Laterality: Left;  . Incision and drainage hip Left 11/10/2015    Procedure: IRRIGATION AND DEBRIDEMENT LEFT HIP INCISION;  Surgeon: Mcarthur Rossetti, MD;  Location: [place];  Service: Orthopedics;  Laterality: Left;    There were no vitals filed for this visit.      Subjective Assessment - 03/22/16 1126    Subjective My left hip hurts and nothing helps.   Limitations Sitting;Standing   How long can you sit comfortably? 20 minutes.   How long can you stand comfortably? 15-20 minutes.   Patient Stated Goals Get out of pain and walk better.   Currently in Pain? Yes   Pain Score 6    Pain Location Hip   Pain Orientation Left   Pain Descriptors / Indicators Throbbing;Aching                         [facility] Adult PT Treatment/Exercise - 03/22/16 0001    Exercises   Exercises Knee/Hip   Knee/Hip Exercises: Stretches   Hip Flexor Stretch Left;1 rep;30 seconds   ITB Stretch Left;1 rep;30 seconds   Knee/Hip Exercises: Aerobic  Nustep L 3 x 10   Modalities   Modalities Electrical Stimulation;Iontophoresis   Electrical Stimulation   Electrical Stimulation Location R hip and lateral leg   Electrical Stimulation Action IFC   Electrical Stimulation Parameters 80-150 hz to tolerance x 15 min    Electrical Stimulation Goals Pain   Iontophoresis   Type of Iontophoresis Dexamethasone   Location Right hip.   Dose 80 mA-Min.   Manual Therapy   Manual Therapy Myofascial release;Soft tissue mobilization   Soft tissue mobilization to L gluteals, piriformis, lateral quads and HS   Myofascial Release L ITB                PT Education - 03/22/16 1209    Education provided Yes   Education Details HEP   Person(s) Educated Patient   Methods Explanation;Demonstration;Handout   Comprehension Returned demonstration;Verbalized understanding          PT Short Term Goals - 03/19/16 1108    PT SHORT TERM GOAL #1   Title Ind with a comprehensive HEP.   Time 4    Period Weeks   Status New           PT Long Term Goals - 03/19/16 1108    PT LONG TERM GOAL #1   Title Walk without antalgia a community distance.   Time 8   Period Weeks   Status New   PT LONG TERM GOAL #2   Title Increase left hip strength to a solid 4+/5 to increase stability for functional tasks.   Time 8   Period Weeks   Status New   PT LONG TERM GOAL #3   Title Stand 30 minutes with pain not > 3/10.   Time 8   Period Weeks   Status New   PT LONG TERM GOAL #4   Title Sit 30 minutes with pain not > 3/10.   Time 8   Period Weeks   Status New   PT LONG TERM GOAL #5   Title Perform ADL's with left hip pain not > 3/10.   Time 8   Period Weeks   Status New               Plan - 03/22/16 1211    Clinical Impression Statement Patient tolerated TE and STW well although she was painful along ITB and throughout gluts and piriformis. Discussed DN with patient however she has received results of bloodwork yet as to whether or not she has an infection.   Rehab Potential Good   PT Frequency 2x / week   PT Duration 8 weeks   PT Treatment/Interventions ADLs/Self Care Home Management;Electrical Stimulation;Iontophoresis 35m/ml Dexamethasone;Therapeutic exercise;Patient/family education;Manual techniques;Dry needling;Passive range of motion   PT Next Visit Plan STW/M to left hip incisional site and greater trochanter; e'stim; Ionto and left hip strengthening.   Consulted and Agree with Plan of Care Patient      Patient will benefit from skilled therapeutic intervention in order to improve the following deficits and impairments:  Pain, Decreased activity tolerance, Decreased strength, Abnormal gait  Visit Diagnosis: Pain in left hip  Muscle weakness (generalized)  Other abnormalities of gait and mobility     Problem List Patient Active Problem List   Diagnosis Date Noted  . Surgical wound dehiscence left hip; questionable superficial infection 11/10/2015  . Left  hip postoperative wound infection 11/10/2015  . Osteoarthritis of left hip 09/09/2015  . 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 Obesity (BMI 35.0-39.9  without comorbidity) ([place]) 04/28/2013    Madelyn Flavors PT  03/22/2016, 12:57 PM  [facility] [place], [name], [address] Phone: [phone]   Fax:  [phone]  Name: Marilyn [name] MRN: [id number] Date of Birth: 01/08/1971  PHYSICAL THERAPY DISCHARGE SUMMARY  Visits from Start of Care: 2.  Current functional level related to goals / functional outcomes: See above.   Remaining deficits: Patient did not return to PT.   Education / Equipment:   Plan: Patient agrees to discharge.  Patient goals were not met. Patient is being discharged due to not returning since the last visit.  ?????        Mali Applegate MPT

## 2016-03-22 NOTE — Patient Instructions (Signed)
Iliotibial Band Stretch, Side-Lying   Lie on side, back to edge of bed, top arm in front. Allow top leg to drape behind over edge. Hold 30 or more seconds.  Repeat 3 times per session. Do _2-3 sessions per day.   HIP: Flexors - Supine   Lie on edge of surface. Place leg off the surface, allow knee to bend. Bring other knee toward chest. Hold _60__ seconds. _3__ reps per set, _2-3__ times per day, Rest lowered foot on stool if needed.  Solon PalmJulie Braden Cimo, PT 03/22/2016 12:09 PM [facility] [address] [place], [place], [address] Phone: [phone]   Fax:  [phone]

## 2016-03-27 ENCOUNTER — Ambulatory Visit: Payer: Medicare Other | Admitting: *Deleted

## 2016-03-29 ENCOUNTER — Encounter: Payer: Medicare Other | Admitting: *Deleted

## 2016-05-31 ENCOUNTER — Other Ambulatory Visit: Payer: Self-pay | Admitting: Orthopaedic Surgery

## 2016-05-31 DIAGNOSIS — M25552 Pain in left hip: Secondary | ICD-10-CM

## 2016-06-01 ENCOUNTER — Ambulatory Visit
Admission: RE | Admit: 2016-06-01 | Discharge: 2016-06-01 | Disposition: A | Discharge status: Home / Self Care | Payer: Medicare Other | Source: Ambulatory Visit | Attending: Orthopaedic Surgery | Admitting: Orthopaedic Surgery

## 2016-06-01 ENCOUNTER — Other Ambulatory Visit: Payer: Self-pay

## 2016-06-01 DIAGNOSIS — M25552 Pain in left hip: Secondary | ICD-10-CM

## 2016-06-27 ENCOUNTER — Ambulatory Visit (INDEPENDENT_AMBULATORY_CARE_PROVIDER_SITE_OTHER): Payer: Medicare Other | Admitting: Orthopaedic Surgery

## 2016-06-27 DIAGNOSIS — M25552 Pain in left hip: Secondary | ICD-10-CM

## 2016-07-18 ENCOUNTER — Observation Stay (HOSPITAL_COMMUNITY)
Admission: EM | Admit: 2016-07-18 | Discharge: 2016-07-19 | Disposition: A | Discharge status: Home / Self Care | Payer: Medicare Other | Attending: Internal Medicine | Admitting: Internal Medicine

## 2016-07-18 ENCOUNTER — Observation Stay (HOSPITAL_COMMUNITY): Payer: Medicare Other

## 2016-07-18 ENCOUNTER — Encounter (HOSPITAL_COMMUNITY): Payer: Self-pay

## 2016-07-18 DIAGNOSIS — Z87891 Personal history of nicotine dependence: Secondary | ICD-10-CM | POA: Diagnosis not present

## 2016-07-18 DIAGNOSIS — E119 Type 2 diabetes mellitus without complications: Secondary | ICD-10-CM | POA: Diagnosis not present

## 2016-07-18 DIAGNOSIS — F418 Other specified anxiety disorders: Secondary | ICD-10-CM | POA: Diagnosis present

## 2016-07-18 DIAGNOSIS — Z91040 Latex allergy status: Secondary | ICD-10-CM | POA: Insufficient documentation

## 2016-07-18 DIAGNOSIS — J4521 Mild intermittent asthma with (acute) exacerbation: Secondary | ICD-10-CM

## 2016-07-18 DIAGNOSIS — J45901 Unspecified asthma with (acute) exacerbation: Secondary | ICD-10-CM | POA: Diagnosis not present

## 2016-07-18 DIAGNOSIS — R0600 Dyspnea, unspecified: Secondary | ICD-10-CM

## 2016-07-18 DIAGNOSIS — J45909 Unspecified asthma, uncomplicated: Secondary | ICD-10-CM | POA: Diagnosis present

## 2016-07-18 DIAGNOSIS — R0602 Shortness of breath: Secondary | ICD-10-CM | POA: Diagnosis present

## 2016-07-18 DIAGNOSIS — Z79899 Other long term (current) drug therapy: Secondary | ICD-10-CM | POA: Diagnosis not present

## 2016-07-18 DIAGNOSIS — R739 Hyperglycemia, unspecified: Secondary | ICD-10-CM | POA: Diagnosis present

## 2016-07-18 DIAGNOSIS — E039 Hypothyroidism, unspecified: Secondary | ICD-10-CM | POA: Insufficient documentation

## 2016-07-18 DIAGNOSIS — Z23 Encounter for immunization: Secondary | ICD-10-CM | POA: Diagnosis not present

## 2016-07-18 DIAGNOSIS — I10 Essential (primary) hypertension: Secondary | ICD-10-CM | POA: Insufficient documentation

## 2016-07-18 DIAGNOSIS — E876 Hypokalemia: Secondary | ICD-10-CM | POA: Diagnosis present

## 2016-07-18 LAB — BASIC METABOLIC PANEL
Anion gap: 12 (ref 5–15)
BUN: 14 mg/dL (ref 6–20)
CALCIUM: 9.1 mg/dL (ref 8.9–10.3)
CO2: 18 mmol/L — ABNORMAL LOW (ref 22–32)
CREATININE: 1.1 mg/dL — AB (ref 0.44–1.00)
Chloride: 104 mmol/L (ref 101–111)
GFR calc Af Amer: >60 mL/min (ref >60)
GFR, EST NON AFRICAN AMERICAN: 59 mL/min — AB (ref >60)
GLUCOSE: 272 mg/dL — AB (ref 65–99)
Potassium: 3.4 mmol/L — ABNORMAL LOW (ref 3.5–5.1)
Sodium: 134 mmol/L — ABNORMAL LOW (ref 135–145)

## 2016-07-18 LAB — GLUCOSE, CAPILLARY
GLUCOSE-CAPILLARY: 202 mg/dL — AB (ref 65–99)
GLUCOSE-CAPILLARY: 165 mg/dL — AB (ref 65–99)
Glucose-Capillary: 212 mg/dL — ABNORMAL HIGH (ref 65–99)
Glucose-Capillary: 187 mg/dL — ABNORMAL HIGH (ref 65–99)

## 2016-07-18 LAB — CBC WITH DIFFERENTIAL/PLATELET
BASOS ABS: 0 10*3/uL (ref 0.0–0.1)
BASOS PCT: 0 %
EOS ABS: 0 10*3/uL (ref 0.0–0.7)
EOS PCT: 0 %
HEMATOCRIT: 39.6 % (ref 36.0–46.0)
Hemoglobin: 12.5 g/dL (ref 12.0–15.0)
LYMPHS PCT: 11 %
Lymphs Abs: 0.9 10*3/uL (ref 0.7–4.0)
MCH: 26.2 pg (ref 26.0–34.0)
MCHC: 31.6 g/dL (ref 30.0–36.0)
MCV: 82.8 fL (ref 78.0–100.0)
MONO ABS: 0 10*3/uL — AB (ref 0.1–1.0)
Monocytes Relative: 0 %
Neutro Abs: 7.7 10*3/uL (ref 1.7–7.7)
Neutrophils Relative %: 89 %
PLATELETS: ADEQUATE 10*3/uL (ref 150–400)
RBC: 4.78 MIL/uL (ref 3.87–5.11)
RDW: 15.2 % (ref 11.5–15.5)
Smear Review: ADEQUATE
WBC: 8.6 10*3/uL (ref 4.0–10.5)

## 2016-07-18 MED ORDER — OXYCODONE-ACETAMINOPHEN 10-325 MG PO TABS: 1.0000 | ORAL_TABLET | Freq: Four times a day (QID) | ORAL | Status: DC | PRN

## 2016-07-18 MED ORDER — HYDRALAZINE HCL 25 MG PO TABS
50.0000 mg | ORAL_TABLET | Freq: Three times a day (TID) | ORAL | Status: DC
  Administered 2016-07-18 – 2016-07-19 (×3): 50 mg via ORAL
  Filled 2016-07-18 (×3): qty 2

## 2016-07-18 MED ORDER — AMLODIPINE BESYLATE 5 MG PO TABS
10.0000 mg | ORAL_TABLET | Freq: Every day | ORAL | Status: DC
  Administered 2016-07-18 – 2016-07-19 (×2): 10 mg via ORAL
  Filled 2016-07-18 (×2): qty 2

## 2016-07-18 MED ORDER — OXYCODONE HCL 5 MG PO TABS
5.0000 mg | ORAL_TABLET | Freq: Four times a day (QID) | ORAL | Status: DC | PRN
  Administered 2016-07-18: 5 mg via ORAL
  Filled 2016-07-18 (×2): qty 1

## 2016-07-18 MED ORDER — METHYLPREDNISOLONE SODIUM SUCC 125 MG IJ SOLR
125.0000 mg | Freq: Once | INTRAMUSCULAR | Status: AC
  Administered 2016-07-18: 125 mg via INTRAVENOUS
  Filled 2016-07-18: qty 2

## 2016-07-18 MED ORDER — METHYLPREDNISOLONE SODIUM SUCC 125 MG IJ SOLR
80.0000 mg | Freq: Three times a day (TID) | INTRAMUSCULAR | Status: DC
  Administered 2016-07-18 – 2016-07-19 (×3): 80 mg via INTRAVENOUS
  Filled 2016-07-18 (×3): qty 2

## 2016-07-18 MED ORDER — IPRATROPIUM BROMIDE 0.02 % IN SOLN
RESPIRATORY_TRACT | Status: AC
  Administered 2016-07-18: 0.5 mg
  Filled 2016-07-18: qty 2.5

## 2016-07-18 MED ORDER — IPRATROPIUM-ALBUTEROL 0.5-2.5 (3) MG/3ML IN SOLN
3.0000 mL | Freq: Once | RESPIRATORY_TRACT | Status: AC
  Administered 2016-07-18: 3 mL via RESPIRATORY_TRACT
  Filled 2016-07-18: qty 3

## 2016-07-18 MED ORDER — POLYETHYLENE GLYCOL 3350 17 G PO PACK: 17.0000 g | PACK | Freq: Every day | ORAL | Status: DC | PRN

## 2016-07-18 MED ORDER — ALBUTEROL SULFATE (2.5 MG/3ML) 0.083% IN NEBU
2.5000 mg | INHALATION_SOLUTION | Freq: Four times a day (QID) | RESPIRATORY_TRACT | Status: DC
  Administered 2016-07-18 – 2016-07-19 (×5): 2.5 mg via RESPIRATORY_TRACT
  Filled 2016-07-18 (×4): qty 3

## 2016-07-18 MED ORDER — METHYLPREDNISOLONE SODIUM SUCC 125 MG IJ SOLR
60.0000 mg | Freq: Four times a day (QID) | INTRAMUSCULAR | Status: DC
  Administered 2016-07-18: 60 mg via INTRAVENOUS
  Filled 2016-07-18: qty 2

## 2016-07-18 MED ORDER — ALBUTEROL (5 MG/ML) CONTINUOUS INHALATION SOLN: 15.0000 mg/h | INHALATION_SOLUTION | Freq: Once | RESPIRATORY_TRACT | Status: DC

## 2016-07-18 MED ORDER — OXYBUTYNIN CHLORIDE ER 5 MG PO TB24
10.0000 mg | ORAL_TABLET | Freq: Every day | ORAL | Status: DC
  Administered 2016-07-18 – 2016-07-19 (×2): 10 mg via ORAL
  Filled 2016-07-18 (×2): qty 2

## 2016-07-18 MED ORDER — MOMETASONE FURO-FORMOTEROL FUM 200-5 MCG/ACT IN AERO
2.0000 | INHALATION_SPRAY | Freq: Two times a day (BID) | RESPIRATORY_TRACT | Status: DC
  Administered 2016-07-18 – 2016-07-19 (×2): 2 via RESPIRATORY_TRACT
  Filled 2016-07-18: qty 8.8

## 2016-07-18 MED ORDER — CYCLOBENZAPRINE HCL 10 MG PO TABS: 10.0000 mg | ORAL_TABLET | Freq: Three times a day (TID) | ORAL | Status: DC | PRN

## 2016-07-18 MED ORDER — ONDANSETRON HCL 4 MG PO TABS: 4.0000 mg | ORAL_TABLET | Freq: Four times a day (QID) | ORAL | Status: DC | PRN

## 2016-07-18 MED ORDER — IPRATROPIUM BROMIDE 0.02 % IN SOLN: 0.5000 mg | Freq: Once | RESPIRATORY_TRACT | Status: DC

## 2016-07-18 MED ORDER — LEVOTHYROXINE SODIUM 50 MCG PO TABS
100.0000 ug | ORAL_TABLET | Freq: Every day | ORAL | Status: DC
  Administered 2016-07-18 – 2016-07-19 (×2): 100 ug via ORAL
  Filled 2016-07-18 (×2): qty 2

## 2016-07-18 MED ORDER — ALBUTEROL SULFATE (2.5 MG/3ML) 0.083% IN NEBU: 2.5000 mg | INHALATION_SOLUTION | RESPIRATORY_TRACT | Status: DC | PRN

## 2016-07-18 MED ORDER — ALBUTEROL SULFATE (2.5 MG/3ML) 0.083% IN NEBU
2.5000 mg | INHALATION_SOLUTION | Freq: Once | RESPIRATORY_TRACT | Status: AC
  Administered 2016-07-18: 2.5 mg via RESPIRATORY_TRACT
  Filled 2016-07-18: qty 3

## 2016-07-18 MED ORDER — POTASSIUM CHLORIDE CRYS ER 20 MEQ PO TBCR
30.0000 meq | EXTENDED_RELEASE_TABLET | Freq: Once | ORAL | Status: AC
  Administered 2016-07-18: 30 meq via ORAL
  Filled 2016-07-18: qty 3

## 2016-07-18 MED ORDER — HEPARIN SODIUM (PORCINE) 5000 UNIT/ML IJ SOLN
5000.0000 [IU] | Freq: Three times a day (TID) | INTRAMUSCULAR | Status: DC
  Administered 2016-07-18 – 2016-07-19 (×3): 5000 [IU] via SUBCUTANEOUS
  Filled 2016-07-18 (×3): qty 1

## 2016-07-18 MED ORDER — INSULIN ASPART 100 UNIT/ML ~~LOC~~ SOLN: 0.0000 [IU] | Freq: Every day | SUBCUTANEOUS | Status: DC

## 2016-07-18 MED ORDER — ESCITALOPRAM OXALATE 10 MG PO TABS
10.0000 mg | ORAL_TABLET | Freq: Every day | ORAL | Status: DC
Start: 2016-07-18 — End: 2016-07-19
  Administered 2016-07-18: 10 mg via ORAL
  Filled 2016-07-18: qty 1

## 2016-07-18 MED ORDER — HYDRALAZINE HCL 20 MG/ML IJ SOLN
10.0000 mg | Freq: Four times a day (QID) | INTRAMUSCULAR | Status: DC | PRN
  Filled 2016-07-18: qty 1

## 2016-07-18 MED ORDER — ALBUTEROL (5 MG/ML) CONTINUOUS INHALATION SOLN
INHALATION_SOLUTION | RESPIRATORY_TRACT | Status: AC
  Administered 2016-07-18: 15 mg
  Filled 2016-07-18: qty 20

## 2016-07-18 MED ORDER — POLYETHYLENE GLYCOL 3350 17 GM/SCOOP PO POWD: 17.0000 g | Freq: Every day | ORAL | Status: DC | PRN

## 2016-07-18 MED ORDER — SODIUM CHLORIDE 0.9% FLUSH: 3.0000 mL | INTRAVENOUS | Status: DC | PRN

## 2016-07-18 MED ORDER — PNEUMOCOCCAL VAC POLYVALENT 25 MCG/0.5ML IJ INJ
0.5000 mL | INJECTION | INTRAMUSCULAR | Status: AC
  Administered 2016-07-19: 0.5 mL via INTRAMUSCULAR
  Filled 2016-07-18: qty 0.5

## 2016-07-18 MED ORDER — HYDRALAZINE HCL 20 MG/ML IJ SOLN: 10.0000 mg | INTRAMUSCULAR | Status: DC | PRN

## 2016-07-18 MED ORDER — ONDANSETRON HCL 4 MG/2ML IJ SOLN: 4.0000 mg | Freq: Four times a day (QID) | INTRAMUSCULAR | Status: DC | PRN

## 2016-07-18 MED ORDER — OXYCODONE-ACETAMINOPHEN 5-325 MG PO TABS
1.0000 | ORAL_TABLET | Freq: Four times a day (QID) | ORAL | Status: DC | PRN
  Administered 2016-07-18 (×2): 1 via ORAL
  Filled 2016-07-18 (×2): qty 1

## 2016-07-18 MED ORDER — INSULIN ASPART 100 UNIT/ML ~~LOC~~ SOLN
0.0000 [IU] | Freq: Three times a day (TID) | SUBCUTANEOUS | Status: DC
  Administered 2016-07-18: 2 [IU] via SUBCUTANEOUS
  Administered 2016-07-18 (×2): 3 [IU] via SUBCUTANEOUS
  Administered 2016-07-19: 2 [IU] via SUBCUTANEOUS

## 2016-07-18 MED ORDER — MAGNESIUM SULFATE 2 GM/50ML IV SOLN
2.0000 g | Freq: Once | INTRAVENOUS | Status: AC
  Administered 2016-07-18: 2 g via INTRAVENOUS
  Filled 2016-07-18: qty 50

## 2016-07-18 MED ORDER — ALBUTEROL SULFATE (2.5 MG/3ML) 0.083% IN NEBU: 5.0000 mg | INHALATION_SOLUTION | Freq: Once | RESPIRATORY_TRACT | Status: DC

## 2016-07-18 MED ORDER — IPRATROPIUM-ALBUTEROL 0.5-2.5 (3) MG/3ML IN SOLN: 3.0000 mL | RESPIRATORY_TRACT | Status: DC | PRN

## 2016-07-18 MED ORDER — MONTELUKAST SODIUM 10 MG PO TABS
10.0000 mg | ORAL_TABLET | Freq: Every day | ORAL | Status: DC
  Administered 2016-07-18: 10 mg via ORAL
  Filled 2016-07-18: qty 1

## 2016-07-18 MED ORDER — ENOXAPARIN SODIUM 40 MG/0.4ML ~~LOC~~ SOLN: 40.0000 mg | SUBCUTANEOUS | Status: DC

## 2016-07-18 MED ORDER — SODIUM CHLORIDE 0.9 % IV SOLN: 250.0000 mL | INTRAVENOUS | Status: DC | PRN

## 2016-07-18 MED ORDER — POTASSIUM CHLORIDE CRYS ER 20 MEQ PO TBCR
20.0000 meq | EXTENDED_RELEASE_TABLET | Freq: Once | ORAL | Status: AC
  Administered 2016-07-18: 20 meq via ORAL
  Filled 2016-07-18: qty 1

## 2016-07-18 MED ORDER — SODIUM CHLORIDE 0.9% FLUSH
3.0000 mL | Freq: Two times a day (BID) | INTRAVENOUS | Status: DC
  Administered 2016-07-18 (×2): 3 mL via INTRAVENOUS

## 2016-07-18 NOTE — Progress Notes (Signed)
PROGRESS NOTE                                                                                                                                                                                                             Patient Demographics:    Marilyn [name], is a 45 y.o. female, DOB - [birth year], ZOX:[id number]  Admit date - 07/18/2016   Admitting Physician Briscoe Deutscherimothy S Opyd, MD  Outpatient Primary MD for the patient is Marilyn HectorNYLAND,LEONARD ROBERT, MD  LOS - 0  Chief Complaint  Patient presents with  . Shortness of Breath       Brief Narrative   45 year old morbidly obese African-American female with history of hypertension, anxiety, depression, hypothyroidism and asthma was admitted with three-day history of shortness of breath and wheezing found to be due to asthma exacerbation.   Subjective:    Marilyn [name] today has, No headache, No chest pain, No abdominal pain - No Nausea, No new weakness tingling or numbness, No Cough - Improved wheezing and SOB.     Assessment  & Plan :    Principal Problem:   Exacerbation of asthma Active Problems:   Hypokalemia   Hypertension   Depression with anxiety   Hypothyroidism   Hyperglycemia   Acute asthma exacerbation   1.Acute hypoxic respiratory failure due to acute on chronic asthma exacerbation. Continue IV Solu-Medrol, have added Singulair and delirium, wheezing and shortness of breath is improved, continue scheduled as well as when necessary nebulizer treatments. Likely discharge in the morning if stable with outpatient pulmonary follow-up.  2. Hypertension. Continue Norvasc, have added scheduled and as needed hydralazine for better control.  3. Hypokalemia. Replaced and monitor.  4. Hypothyroidism. Continue home dose Synthroid.  5. Anxiety depression. Continue home medications.  6. Morbid obesity and steroid-induced hyperglycemia. Follow with PCP for weight loss,  sliding scale while on IV Solu Medrol.    Family Communication  :  None  Code Status :  Full  Diet : Heart Healthy  Disposition Plan  :  Home in am  Consults  :  None  Procedures  :  None  DVT Prophylaxis  :    Heparin & SCDs   Lab Results  Component Value Date   PLT  07/18/2016    PLATELET CLUMPS NOTED ON SMEAR, COUNT APPEARS ADEQUATE    Inpatient Medications  Scheduled Meds: . albuterol  2.5 mg Nebulization QID  . amLODipine  10 mg Oral Daily  . escitalopram  10 mg Oral QHS  . insulin aspart  0-5 Units Subcutaneous QHS  . insulin aspart  0-9 Units Subcutaneous TID WC  . levothyroxine  100 mcg Oral QAC breakfast  . methylPREDNISolone (SOLU-MEDROL) injection  80 mg Intravenous TID  . mometasone-formoterol  2 puff Inhalation BID  . montelukast  10 mg Oral QHS  . oxybutynin  10 mg Oral Daily  . potassium chloride  20 mEq Oral Once  . sodium chloride flush  3 mL Intravenous Q12H   Continuous Infusions:  PRN Meds:.sodium chloride, albuterol, cyclobenzaprine, hydrALAZINE, ondansetron **OR** ondansetron (ZOFRAN) IV, oxyCODONE-acetaminophen **AND** oxyCODONE, polyethylene glycol, sodium chloride flush  Antibiotics  :    Anti-infectives    None         Objective:   Vitals:   07/18/16 0600 07/18/16 0630 07/18/16 0800 07/18/16 0911  BP: 193/81 185/82 (!) 178/77   Pulse: 108 107 (!) 109   Resp: 24 23 (!) 24   Temp:   97.3 F (36.3 C)   TempSrc:   Oral   SpO2: 96% 95% 98% 98%  Weight:   (!) 146.3 kg (322 lb 8 oz)   Height:   5\' 3"  (1.6 m)     Wt Readings from Last 3 Encounters:  07/18/16 (!) 146.3 kg (322 lb 8 oz)  12/11/15 134.7 kg (297 lb)  11/10/15 133.8 kg (295 lb)    No intake or output data in the 24 hours ending 07/18/16 0919   Physical Exam  Awake Alert, Oriented X 3, No new F.N deficits, Normal affect [place].AT,PERRAL Supple Neck,No JVD, No cervical lymphadenopathy appriciated.  Symmetrical Chest wall movement, Good air movement bilaterally,  +ve wheezing RRR,No Gallops,Rubs or new Murmurs, No Parasternal Heave +ve B.Sounds, Abd Soft, No tenderness, No organomegaly appriciated, No rebound - guarding or rigidity. No Cyanosis, Clubbing or edema, No new Rash or bruise      Data Review:    CBC  Recent Labs Lab 07/18/16 0312  WBC 8.6  HGB 12.5  HCT 39.6  PLT PLATELET CLUMPS NOTED ON SMEAR, COUNT APPEARS ADEQUATE  MCV 82.8  MCH 26.2  MCHC 31.6  RDW 15.2  LYMPHSABS 0.9  MONOABS 0.0*  EOSABS 0.0  BASOSABS 0.0    Chemistries   Recent Labs Lab 07/18/16 0312  NA 134*  K 3.4*  CL 104  CO2 18*  GLUCOSE 272*  BUN 14  CREATININE 1.10*  CALCIUM 9.1   ------------------------------------------------------------------------------------------------------------------ No results for input(s): CHOL, HDL, LDLCALC, TRIG, CHOLHDL, LDLDIRECT in the last 72 hours.  Lab Results  Component Value Date   HGBA1C 6.2 (H) 09/02/2015   ------------------------------------------------------------------------------------------------------------------ No results for input(s): TSH, T4TOTAL, T3FREE, THYROIDAB in the last 72 hours.  Invalid input(s): FREET3 ------------------------------------------------------------------------------------------------------------------ No results for input(s): VITAMINB12, FOLATE, FERRITIN, TIBC, IRON, RETICCTPCT in the last 72 hours.  Coagulation profile No results for input(s): INR, PROTIME in the last 168 hours.  No results for input(s): DDIMER in the last 72 hours.  Cardiac Enzymes No results for input(s): CKMB, TROPONINI, MYOGLOBIN in the last 168 hours.  Invalid input(s): CK ------------------------------------------------------------------------------------------------------------------ No results found for: BNP  Micro Results No results found for this or any previous visit (from the past 240 hour(s)).  Radiology Reports Dg Chest 1 View  Result Date: 07/18/2016 CLINICAL DATA:   Dyspnea this morning EXAM: CHEST 1 VIEW COMPARISON:  03/12/2015 FINDINGS: A single AP portable  view of the chest demonstrates no focal airspace consolidation or alveolar edema. The lungs are grossly clear. There is no large effusion or pneumothorax. Cardiac and mediastinal contours appear unremarkable. IMPRESSION: No active disease. Electronically Signed   By: Ellery Plunk M.D.   On: 07/18/2016 06:02    Time Spent in minutes  30   SINGH,PRASHANT K M.D on 07/18/2016 at 9:19 AM  Between 7am to 7pm - Pager - [phone]  After 7pm go to [email or web address] - password [facility]  [facility] -  Office  [phone]

## 2016-07-18 NOTE — Care Management Note (Signed)
Case Management Note  Patient Details  Name: Marilyn [name] M Bodnar MRN: [id number] Date of Birth: 06-04-1971  Subjective/Objective:                  Pt is from home, lives with parents and her children. She is ind with ADL's. She has CPAP at home but no home oxygen. She thought she has a neb machine but does not and will need one. She was prescribed neb tx from the urgent care. She has chosen [facility] from list of DME providers. Alroy BailiffLinda Lothian, [facility], is aware of [facility], will obtain pt info from chart and deliver DME to pt room prior to DC. Pt has PCP, transportation and no difficulty affording her medications. She plans to return home with self care.   Action/Plan: No further CM needs anticipated. Pt will need to be weaned from oxygen prior to DC or have home O2 assessment performed.  Expected Discharge Date:    07/20/2016              Expected Discharge Plan:  Home/Self Care  In-House Referral:  NA  Discharge planning Services  CM Consult  Post Acute Care Choice:  Durable Medical Equipment Choice offered to:  Patient  DME Arranged:  Nebulizer machine DME Agency:  [facility]  Status of Service:  Completed, signed off  Malcolm MetroChildress, Brennden Masten Demske, RN 07/18/2016, 1:43 PM

## 2016-07-18 NOTE — H&P (Signed)
History and Physical    Marilyn [name] ZOX:[id number] DOB: 03/11/1971 DOA: 07/18/2016  PCP: Josue Hector, MD   Patient coming from: Home  Chief Complaint: SOB, wheezing   HPI: Marilyn [name] is a 45 y.o. female with medical history significant for hypertension, anxiety, depression, hypothyroidism, and asthma who presents to the emergency department with 3 days of worsening dyspnea and wheezing. Patient was in her usual state of health until approximately 3 days ago when she noted the insidious development of mild exertional dyspnea and occasional wheezing. Symptoms quickly began to progress and she sought evaluation in an urgent care yesterday.  She was suspected to be having an acute asthma exacerbation at the urgent care and was treated with IM steroid and albuterol nebulizers before being discharged home with prednisone and albuterol nebs. Despite frequent use of the nebulizer, the patient continued to worsen and became very dyspneic at rest. She denies any recent fevers or chills, denies chest pain or palpitations, and denies recent long distance travel or sick contacts. She has not experienced any runny nose, sore throat, or aches. She denies orthopnea or significant lower extremity edema. She reports having an asthma exacerbation approximately twice per year, but has never required hospitalization. She was previously treated with a controller medication on a scheduled basis, but reports that it has been several years since she has used anything other than albuterol.  ED Course: Upon arrival to the ED, patient is found to be afebrile, saturating adequately on room air, tachycardic in the 110s, and hypertensive to 210/100. EKG features a sinus tachycardia with rate 111 and QTc of 515. Chest x-ray is negative for acute cardiopulmonary disease. Chemistry panel is notable for a mild hyponatremia and hypokalemia, as well as serum glucose 272. CBC features clumped platelets and is otherwise  unremarkable. Patient was treated with duo nebs and continuous albuterol nebulizer. She was given 125 mg IV Solu-Medrol and 2 g of IV magnesium. She had some improvement in the ED, but continues to be dyspneic at rest and will be observed on the telemetry unit for ongoing evaluation and management of asthma with acute exacerbation.  Review of Systems:  All other systems reviewed and apart from HPI, are negative.  Past Medical History:  Diagnosis Date  . Anemia    taking iron now. pt states having no current issues 09/02/2015  . Anginal pain (HCC)    pt states experiences chest wall pain pt states related to her asthma   . Anxiety    with MRI's  . Arthritis   . Asthma   . Dizziness   . GERD (gastroesophageal reflux disease)   . Headache(784.0)    HX OF MIGRAINES  . History of bronchitis   . Hypertension   . Hypothyroidism    takes levothyroxen  . OSA on CPAP   . Shortness of breath    with exertion  . Wears glasses     Past Surgical History:  Procedure Laterality Date  . CESAREAN SECTION     times 2  . CHOLECYSTECTOMY    . ENDOMETRIAL ABLATION    . INCISION AND DRAINAGE HIP Left 11/10/2015   Procedure: IRRIGATION AND DEBRIDEMENT LEFT HIP INCISION;  Surgeon: Kathryne Hitch, MD;  Location: [facility] OR;  Service: Orthopedics;  Laterality: Left;  . JOINT REPLACEMENT  2011   total left knee  . KNEE ARTHROPLASTY  04/23/2012   right   . KNEE ARTHROSCOPY    . ROTATOR CUFF REPAIR  left   . SHOULDER SURGERY     right to repair ligament tear  . TOTAL HIP ARTHROPLASTY Left 09/09/2015   Procedure: LEFT TOTAL HIP ARTHROPLASTY ANTERIOR APPROACH;  Surgeon: Kathryne Hitch, MD;  Location: [facility] ORS;  Service: Orthopedics;  Laterality: Left;  . TOTAL KNEE ARTHROPLASTY  04/23/2012   Procedure: TOTAL KNEE ARTHROPLASTY;  Surgeon: [name], MD;  Location: [facility] OR;  Service: Orthopedics;  Laterality: Right;  Right Total Knee Arthroplasty  . TUBAL LIGATION  1996     reports that  she quit smoking about 24 years ago. She has a 2.00 pack-year smoking history. She has never used smokeless tobacco. She reports that she does not drink alcohol or use drugs.  Allergies  Allergen Reactions  . Lisinopril Anaphylaxis  . Bee Venom Swelling    On site swelling  . Codeine Other (See Comments)    Upset stomach  . Darvocet [Propoxyphene N-Acetaminophen] Hives    Tolerates tylenol  . Meloxicam Diarrhea and Other (See Comments)    Insomnia, constipation  . Latex Rash  . Tomato Rash    Family History  Problem Relation Age of Onset  . Cancer Mother     colon  . Epilepsy Mother   . Cancer Father     prostate  . Diabetes Father   . Hypertension Father   . Hypertension Maternal Aunt   . Diabetes Maternal Aunt   . Hypertension Paternal Aunt      Prior to Admission medications   Medication Sig Start Date End Date Taking? Authorizing Provider  predniSONE (DELTASONE) 20 MG tablet Take 20 mg by mouth daily with breakfast. 07/17/16 07/22/16 Yes Historical Provider, MD  albuterol (PROVENTIL HFA;VENTOLIN HFA) 108 (90 BASE) MCG/ACT inhaler Inhale 2 puffs into the lungs every 6 (six) hours as needed for wheezing or shortness of breath.    Historical Provider, MD  amLODipine (NORVASC) 5 MG tablet Take 5 mg by mouth daily.     Historical Provider, MD  Ascorbic Acid (VITAMIN C PO) Take 1 tablet by mouth daily.    Historical Provider, MD  cephALEXin (KEFLEX) 500 MG capsule Take 1 capsule (500 mg total) by mouth 4 (four) times daily. 11/14/15   Kathryne Hitch, MD  cetirizine (ZYRTEC) 10 MG tablet Take 10 mg by mouth daily as needed for allergies.    Historical Provider, MD  ciprofloxacin (CIPRO) 500 MG tablet Take 1 tablet (500 mg total) by mouth 2 (two) times daily. 11/14/15   Kathryne Hitch, MD  cyclobenzaprine (FLEXERIL) 10 MG tablet Take 1 tablet (10 mg total) by mouth 3 (three) times daily as needed for muscle spasms. 09/12/15   Kathryne Hitch, MD    diphenhydrAMINE (BENADRYL) 25 MG tablet Take 1 tablet (25 mg total) by mouth every 6 (six) hours. Patient taking differently: Take 25 mg by mouth at bedtime as needed for itching or allergies.  09/29/15   Azalia Bilis, MD  escitalopram (LEXAPRO) 10 MG tablet Take 10 mg by mouth at bedtime.    Historical Provider, MD  hydrochlorothiazide 25 MG tablet Take 25 mg by mouth daily.     Historical Provider, MD  levothyroxine (SYNTHROID, LEVOTHROID) 100 MCG tablet Take 100 mcg by mouth daily before breakfast.    Historical Provider, MD  naproxen (NAPROSYN) 500 MG tablet Take 1 tablet (500 mg total) by mouth 2 (two) times daily. Patient taking differently: Take 500 mg by mouth 2 (two) times daily as needed for mild pain.  10/06/15  Burgess AmorJulie Idol, PA-C  naproxen sodium (ALEVE) 220 MG tablet Take 440-660 mg by mouth 2 (two) times daily as needed (Pain).    Historical Provider, MD  oxybutynin (DITROPAN-XL) 10 MG 24 hr tablet Take 10 mg by mouth daily.     Historical Provider, MD  oxyCODONE (ROXICODONE) 15 MG immediate release tablet Take 15 mg by mouth every 4 (four) hours as needed for pain.    Historical Provider, MD  oxyCODONE-acetaminophen (PERCOCET) 10-325 MG tablet Take 1-2 tablets by mouth every 6 (six) hours as needed for pain. 11/14/15   Kathryne Hitchhristopher Y Blackman, MD  polyethylene glycol powder (GLYCOLAX/MIRALAX) powder Take 17 g by mouth daily as needed for mild constipation.  09/26/15   Historical Provider, MD    Physical Exam: Vitals:   07/18/16 0356 07/18/16 0400 07/18/16 0432 07/18/16 0505  BP: 198/86 187/88  194/87  Pulse: 110 104  104  Resp: 26 20  23   Temp:      TempSrc:      SpO2: 99% 97% 98% 98%  Weight:      Height:          Constitutional: Respiratory distress with tachypnea and accessory muscle recruitment. No pallor, no cyanosis Eyes: PERTLA, lids and conjunctivae normal ENMT: Mucous membranes are moist. Posterior pharynx clear of any exudate or lesions.   Neck: normal, supple, no  masses, no thyromegaly Respiratory: Markedly diminished b/l with occasional expiratory wheeze. Increased WOB.  Cardiovascular: Rate ~110 and regular. No carotid bruits. No significant JVD. Abdomen: No distension, no tenderness, no masses palpated. Bowel sounds normal.  Musculoskeletal: no clubbing / cyanosis. No joint deformity upper and lower extremities. Normal muscle tone.  Skin: no significant rashes, lesions, ulcers. Warm, dry, well-perfused. Neurologic: CN 2-12 grossly intact. Sensation intact, DTR normal. Strength 5/5 in all 4 limbs.  Psychiatric: Normal judgment and insight. Alert and oriented x 3. Normal mood and affect.     Labs on Admission: I have personally reviewed following labs and imaging studies  CBC:  Recent Labs Lab 07/18/16 0312  WBC 8.6  NEUTROABS 7.7  HGB 12.5  HCT 39.6  MCV 82.8  PLT PLATELET CLUMPS NOTED ON SMEAR, COUNT APPEARS ADEQUATE   Basic Metabolic Panel:  Recent Labs Lab 07/18/16 0312  NA 134*  K 3.4*  CL 104  CO2 18*  GLUCOSE 272*  BUN 14  CREATININE 1.10*  CALCIUM 9.1   GFR: Estimated Creatinine Clearance: 90.2 mL/min (by C-G formula based on SCr of 1.1 mg/dL (H)). Liver Function Tests: No results for input(s): AST, ALT, ALKPHOS, BILITOT, PROT, ALBUMIN in the last 168 hours. No results for input(s): LIPASE, AMYLASE in the last 168 hours. No results for input(s): AMMONIA in the last 168 hours. Coagulation Profile: No results for input(s): INR, PROTIME in the last 168 hours. Cardiac Enzymes: No results for input(s): CKTOTAL, CKMB, CKMBINDEX, TROPONINI in the last 168 hours. BNP (last 3 results) No results for input(s): PROBNP in the last 8760 hours. HbA1C: No results for input(s): HGBA1C in the last 72 hours. CBG: No results for input(s): GLUCAP in the last 168 hours. Lipid Profile: No results for input(s): CHOL, HDL, LDLCALC, TRIG, CHOLHDL, LDLDIRECT in the last 72 hours. Thyroid Function Tests: No results for input(s): TSH,  T4TOTAL, FREET4, T3FREE, THYROIDAB in the last 72 hours. Anemia Panel: No results for input(s): VITAMINB12, FOLATE, FERRITIN, TIBC, IRON, RETICCTPCT in the last 72 hours. Urine analysis:    Component Value Date/Time   COLORURINE YELLOW 07/18/2012 1156   APPEARANCEUR  CLEAR 07/18/2012 1156   LABSPEC 1.025 07/18/2012 1156   PHURINE 5.5 07/18/2012 1156   GLUCOSEU NEGATIVE 07/18/2012 1156   HGBUR SMALL (A) 07/18/2012 1156   BILIRUBINUR SMALL (A) 07/18/2012 1156   KETONESUR NEGATIVE 07/18/2012 1156   PROTEINUR 30 (A) 07/18/2012 1156   UROBILINOGEN 0.2 07/18/2012 1156   NITRITE POSITIVE (A) 07/18/2012 1156   LEUKOCYTESUR TRACE (A) 07/18/2012 1156   Sepsis Labs: @LABRCNTIP (procalcitonin:4,lacticidven:4) )No results found for this or any previous visit (from the past 240 hour(s)).   Radiological Exams on Admission: Dg Chest 1 View  Result Date: 07/18/2016 CLINICAL DATA:  Dyspnea this morning EXAM: CHEST 1 VIEW COMPARISON:  03/12/2015 FINDINGS: A single AP portable view of the chest demonstrates no focal airspace consolidation or alveolar edema. The lungs are grossly clear. There is no large effusion or pneumothorax. Cardiac and mediastinal contours appear unremarkable. IMPRESSION: No active disease. Electronically Signed   By: Ellery Plunkaniel R Mitchell M.D.   On: 07/18/2016 06:02    EKG: Independently reviewed. Sinus tachycardia (rate 112), QTc 515  Assessment/Plan  1. Asthma with acute exacerbation - Presents in acute respiratory distress despite treatment at home with steroids and nebs  - Reports ~2 exacerbations per year; this is her first hospitalization for asthma; was on Advair years ago, now just using albuterol prn  - CXR is clear, EKG without ischemia, no JVD or edema - She was treated with 2 g IV mag, 125 mg IV Solu-Medrol, and multiple neb treatments in ED  - Continue albuterol nebs, Solu-Medrol 60 mg IV q6h   2. Hypertension - BP elevated to 220/105 initially, improved by time of  admission but still elevated   - Managed at home with Norvasc and HCTZ  - Continue Norvasc; hold HCTZ in setting of hyponatremia and hypokalemia  - Hydralazine IVPs available prn    3. Hypokalemia  - Serum potassium 3.4 on admission - 30 mEq K-Dur given on admission - She was treated with 2 g IV magnesium in ED    4. Hypothyroidism  - Appears to be stable  - Continue Synthroid   5. Hyperglycemia - Serum glucose 272 on admission, likely secondary to steroids  - Check CBG with meals and qHS - Start a low-intensity sliding-scale insulin correctional   6. Depression, anxiety - Pt reports this to be stable - Continue Lexapro     DVT prophylaxis: SCDs  Code Status: Full  Family Communication: Discussed with patient Disposition Plan: Observe on med-surg Consults called: None Admission status: Observation    Briscoe Deutscherimothy S Opyd, MD [facility] Pager [phone]  If 7PM-7AM, please contact night-coverage [email or web address] Password [email or web address]  07/18/2016, 6:07 AM

## 2016-07-18 NOTE — Care Management Obs Status (Signed)
MEDICARE OBSERVATION STATUS NOTIFICATION   Patient Details  Name: Marilyn [name] MRN: [id number] Date of Birth: May 03, 1971   Medicare Observation Status Notification Given:  Yes    Malcolm MetroChildress, Lanayah Gartley Demske, RN 07/18/2016, 1:16 PM

## 2016-07-18 NOTE — ED Notes (Signed)
ED Provider at bedside. 

## 2016-07-18 NOTE — ED Provider Notes (Signed)
[facility] DEPT Provider Note   CSN: [id number] Arrival date & time: 07/18/16  0248  Time seen 02:55 AM   History   Chief Complaint Chief Complaint  Patient presents with  . Shortness of Breath   Level 5 caveat for respiratory distress  HPI Marilyn [name] is a 45 y.o. female.  HPI patient only able to answer yes or no questions by shaking her head. She indicates she has a history of asthma and it is seasonally aggravated. She reports for the past 3 days she's been having increasing shortness of breath. She was seen earlier today at her doctor's office and got a steroid injection and started on oral prednisone. She reports dry cough and chest tightness. She has a mild sore throat. She denies rhinorrhea or fever. She indicates she's never had to be admitted because her asthma so bad.  Past Medical History:  Diagnosis Date  . Anemia    taking iron now. pt states having no current issues 09/02/2015  . Anginal pain (HCC)    pt states experiences chest wall pain pt states related to her asthma   . Anxiety    with MRI's  . Arthritis   . Asthma   . Dizziness   . GERD (gastroesophageal reflux disease)   . Headache(784.0)    HX OF MIGRAINES  . History of bronchitis   . Hypertension   . Hypothyroidism    takes levothyroxen  . OSA on CPAP   . Shortness of breath    with exertion  . Wears glasses     Patient Active Problem List   Diagnosis Date Noted  . Surgical wound dehiscence left hip; questionable superficial infection 11/10/2015  . Left hip postoperative wound infection 11/10/2015  . Osteoarthritis of left hip 09/09/2015  . 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 Obesity (BMI 35.0-39.9 without comorbidity) 04/28/2013    Past Surgical History:  Procedure Laterality Date  . CESAREAN SECTION     times 2  . CHOLECYSTECTOMY    . ENDOMETRIAL ABLATION    . INCISION AND DRAINAGE HIP Left 11/10/2015   Procedure: IRRIGATION AND DEBRIDEMENT LEFT HIP  INCISION;  Surgeon: Kathryne Hitch, MD;  Location: [facility] OR;  Service: Orthopedics;  Laterality: Left;  . JOINT REPLACEMENT  2011   total left knee  . KNEE ARTHROPLASTY  04/23/2012   right   . KNEE ARTHROSCOPY    . ROTATOR CUFF REPAIR     left   . SHOULDER SURGERY     right to repair ligament tear  . TOTAL HIP ARTHROPLASTY Left 09/09/2015   Procedure: LEFT TOTAL HIP ARTHROPLASTY ANTERIOR APPROACH;  Surgeon: Kathryne Hitch, MD;  Location: [facility];  Service: Orthopedics;  Laterality: Left;  . TOTAL KNEE ARTHROPLASTY  04/23/2012   Procedure: TOTAL KNEE ARTHROPLASTY;  Surgeon: [name], MD;  Location: [facility] OR;  Service: Orthopedics;  Laterality: Right;  Right Total Knee Arthroplasty  . TUBAL LIGATION  1996    OB History    Gravida Para Term Preterm AB Living   2 2 2     2    SAB TAB Ectopic Multiple Live Births                   Home Medications    Prior to Admission medications   Medication Sig Start Date End Date Taking? Authorizing Provider  predniSONE (DELTASONE) 20 MG tablet Take 20 mg by mouth daily with breakfast. 07/17/16 07/22/16 Yes Historical Provider,  MD  albuterol (PROVENTIL HFA;VENTOLIN HFA) 108 (90 BASE) MCG/ACT inhaler Inhale 2 puffs into the lungs every 6 (six) hours as needed for wheezing or shortness of breath.    Historical Provider, MD  amLODipine (NORVASC) 5 MG tablet Take 5 mg by mouth daily.     Historical Provider, MD  Ascorbic Acid (VITAMIN C PO) Take 1 tablet by mouth daily.    Historical Provider, MD  cephALEXin (KEFLEX) 500 MG capsule Take 1 capsule (500 mg total) by mouth 4 (four) times daily. 11/14/15   Kathryne Hitchhristopher Y Blackman, MD  cetirizine (ZYRTEC) 10 MG tablet Take 10 mg by mouth daily as needed for allergies.    Historical Provider, MD  ciprofloxacin (CIPRO) 500 MG tablet Take 1 tablet (500 mg total) by mouth 2 (two) times daily. 11/14/15   Kathryne Hitchhristopher Y Blackman, MD  cyclobenzaprine (FLEXERIL) 10 MG tablet Take 1 tablet (10 mg total) by  mouth 3 (three) times daily as needed for muscle spasms. 09/12/15   Kathryne Hitchhristopher Y Blackman, MD  diphenhydrAMINE (BENADRYL) 25 MG tablet Take 1 tablet (25 mg total) by mouth every 6 (six) hours. Patient taking differently: Take 25 mg by mouth at bedtime as needed for itching or allergies.  09/29/15   Azalia BilisKevin Campos, MD  escitalopram (LEXAPRO) 10 MG tablet Take 10 mg by mouth at bedtime.    Historical Provider, MD  hydrochlorothiazide 25 MG tablet Take 25 mg by mouth daily.     Historical Provider, MD  levothyroxine (SYNTHROID, LEVOTHROID) 100 MCG tablet Take 100 mcg by mouth daily before breakfast.    Historical Provider, MD  naproxen (NAPROSYN) 500 MG tablet Take 1 tablet (500 mg total) by mouth 2 (two) times daily. Patient taking differently: Take 500 mg by mouth 2 (two) times daily as needed for mild pain.  10/06/15   Burgess AmorJulie Idol, PA-C  naproxen sodium (ALEVE) 220 MG tablet Take 440-660 mg by mouth 2 (two) times daily as needed (Pain).    Historical Provider, MD  oxybutynin (DITROPAN-XL) 10 MG 24 hr tablet Take 10 mg by mouth daily.     Historical Provider, MD  oxyCODONE (ROXICODONE) 15 MG immediate release tablet Take 15 mg by mouth every 4 (four) hours as needed for pain.    Historical Provider, MD  oxyCODONE-acetaminophen (PERCOCET) 10-325 MG tablet Take 1-2 tablets by mouth every 6 (six) hours as needed for pain. 11/14/15   Kathryne Hitchhristopher Y Blackman, MD  polyethylene glycol powder (GLYCOLAX/MIRALAX) powder Take 17 g by mouth daily as needed for mild constipation.  09/26/15   Historical Provider, MD    Family History Family History  Problem Relation Age of Onset  . Cancer Mother     colon  . Epilepsy Mother   . Cancer Father     prostate  . Diabetes Father   . Hypertension Father   . Hypertension Maternal Aunt   . Diabetes Maternal Aunt   . Hypertension Paternal Aunt     Social History Social History  Substance Use Topics  . Smoking status: Former Smoker    Packs/day: 0.50    Years: 4.00     Quit date: 09/18/1991  . Smokeless tobacco: Never Used  . Alcohol use No     Allergies   Lisinopril; Bee venom; Codeine; Darvocet [propoxyphene n-acetaminophen]; Meloxicam; Latex; and Tomato   Review of Systems Review of Systems  All other systems reviewed and are negative.    Physical Exam Updated Vital Signs BP (!) 219/105 (BP Location: Left Arm)   Pulse  111   Temp 97.9 F (36.6 C) (Oral)   Resp 25   Ht 5\' 3"  (1.6 m)   Wt (!) 325 lb (147.4 kg)   SpO2 98%   BMI 57.57 kg/m   Vital signs normal except for tachycardia and hypertension   Physical Exam  Constitutional: She is oriented to person, place, and time. She appears well-developed and well-nourished.  Non-toxic appearance. She does not appear ill. She appears distressed.  HENT:  Head: Normocephalic and atraumatic.  Right Ear: External ear normal.  Left Ear: External ear normal.  Nose: Nose normal. No mucosal edema or rhinorrhea.  Mouth/Throat: Oropharynx is clear and moist and mucous membranes are normal. No dental abscesses or uvula swelling.  Eyes: Conjunctivae and EOM are normal. Pupils are equal, round, and reactive to light.  Neck: Normal range of motion and full passive range of motion without pain. Neck supple.  Cardiovascular: Normal rate, regular rhythm and normal heart sounds.  Exam reveals no gallop and no friction rub.   No murmur heard. Pulmonary/Chest: Accessory muscle usage present. Tachypnea noted. She is in respiratory distress. She has decreased breath sounds. She has wheezes. She has no rhonchi. She has no rales. She exhibits no tenderness and no crepitus.  Patient has audible wheezing at times, she is unable to verbally answer questions.  Abdominal: Soft. Normal appearance and bowel sounds are normal. She exhibits no distension. There is no tenderness. There is no rebound and no guarding.  Musculoskeletal: Normal range of motion. She exhibits no edema or tenderness.  Moves all extremities  well.   Neurological: She is alert and oriented to person, place, and time. She has normal strength. No cranial nerve deficit.  Skin: Skin is warm, dry and intact. No rash noted. No erythema. No pallor.  Psychiatric: She has a normal mood and affect. Her speech is normal and behavior is normal. Her mood appears not anxious.  Nursing note and vitals reviewed.    ED Treatments / Results  Labs (all labs ordered are listed, but only abnormal results are displayed) Results for orders placed or performed during the hospital encounter of 07/18/16  Basic metabolic panel  Result Value Ref Range   Sodium 134 (L) 135 - 145 mmol/L   Potassium 3.4 (L) 3.5 - 5.1 mmol/L   Chloride 104 101 - 111 mmol/L   CO2 18 (L) 22 - 32 mmol/L   Glucose, Bld 272 (H) 65 - 99 mg/dL   BUN 14 6 - 20 mg/dL   Creatinine, Ser 1.611.10 (H) 0.44 - 1.00 mg/dL   Calcium 9.1 8.9 - 09.610.3 mg/dL   GFR calc non Af Amer 59 (L) >60 mL/min   GFR calc Af Amer >60 >60 mL/min   Anion gap 12 5 - 15  CBC with Differential  Result Value Ref Range   WBC 8.6 4.0 - 10.5 K/uL   RBC 4.78 3.87 - 5.11 MIL/uL   Hemoglobin 12.5 12.0 - 15.0 g/dL   HCT 04.539.6 40.936.0 - 81.146.0 %   MCV 82.8 78.0 - 100.0 fL   MCH 26.2 26.0 - 34.0 pg   MCHC 31.6 30.0 - 36.0 g/dL   RDW 91.415.2 78.211.5 - 95.615.5 %   Platelets  150 - 400 K/uL    PLATELET CLUMPS NOTED ON SMEAR, COUNT APPEARS ADEQUATE   Neutrophils Relative % 89 %   Neutro Abs 7.7 1.7 - 7.7 K/uL   Lymphocytes Relative 11 %   Lymphs Abs 0.9 0.7 - 4.0 K/uL   Monocytes  Relative 0 %   Monocytes Absolute 0.0 (L) 0.1 - 1.0 K/uL   Eosinophils Relative 0 %   Eosinophils Absolute 0.0 0.0 - 0.7 K/uL   Basophils Relative 0 %   Basophils Absolute 0.0 0.0 - 0.1 K/uL   Smear Review      PLATELET CLUMPS NOTED ON SMEAR, COUNT APPEARS ADEQUATE   Laboratory interpretation all normal except mild hypokalemia, normal GFR (African American)    EKG  EKG Interpretation None       Radiology No results  found.  Procedures Procedures (including critical care time)  Medications Ordered in ED Medications  magnesium sulfate IVPB 2 g 50 mL (2 g Intravenous New Bag/Given 07/18/16 0419)  ipratropium (ATROVENT) 0.02 % nebulizer solution (0.5 mg  Given 07/18/16 0309)  albuterol (PROVENTIL, VENTOLIN) (5 MG/ML) 0.5% continuous inhalation solution (15 mg  Given 07/18/16 0309)  methylPREDNISolone sodium succinate (SOLU-MEDROL) 125 mg/2 mL injection 125 mg (125 mg Intravenous Given 07/18/16 0320)  ipratropium-albuterol (DUONEB) 0.5-2.5 (3) MG/3ML nebulizer solution 3 mL (3 mLs Nebulization Given 07/18/16 0429)  albuterol (PROVENTIL) (2.5 MG/3ML) 0.083% nebulizer solution 2.5 mg (2.5 mg Nebulization Given 07/18/16 0429)     Initial Impression / Assessment and Plan / ED Course  I have reviewed the triage vital signs and the nursing notes.  Pertinent labs & imaging results that were available during my care of the patient were reviewed by me and considered in my medical decision making (see chart for details).  Clinical Course   Patient was started on a continuous nebulizer. She received a steroid injection earlier today and was started on oral prednisone. However that was earlier today, so given another IV dose. I reviewed her labs from about 8 months ago and she had a mild renal insufficiency, will hold off of IV magnesium for now.  Recheck 03:30 AM continuous nebulizer about 3/4 done, still have diffuse wheezing, has improved air movement, retractions better, still not talking.   Nurse reports patient is still c/o chest tightness, another neb ordered, BMET back and was given IV magnesium (normal GFR).   Recheck 04:15 AM pt ambulated to bathroom just outside her room. On reexam her lungs are clear. She is able to speak in sentences now.   Recheck 05:00 AM starting to c/o chest tightness again, has low pitched rhonchi returning. Discussed admission and she is agreeable. Has some chest wall tenderness over  the CCJ especially in the right upper areas.   05:23 AM Dr Antionette Char, hospitalist, admit to obs, med-surg  Final Clinical Impressions(s) / ED Diagnoses   Final diagnoses:  Exacerbation of asthma, unspecified asthma severity, unspecified whether persistent    Plan admission  Devoria Albe, MD, FACEP  CRITICAL CARE Performed by: Young Mulvey L Salem Lembke Total critical care time: 40 minutes Critical care time was exclusive of separately billable procedures and treating other patients. Critical care was necessary to treat or prevent imminent or life-threatening deterioration. Critical care was time spent personally by me on the following activities: development of treatment plan with patient and/or surrogate as well as nursing, discussions with consultants, evaluation of patient's response to treatment, examination of patient, obtaining history from patient or surrogate, ordering and performing treatments and interventions, ordering and review of laboratory studies, ordering and review of radiographic studies, pulse oximetry and re-evaluation of patient's condition.     Devoria Albe, MD 07/18/16 [phone]

## 2016-07-18 NOTE — ED Triage Notes (Signed)
Pt reports SOB that started yesterday with dry cough. Pt reports going to PCP today and being prescribed albuterol HFA and nebulizer treatments, as well as prednisone x 5 days. Pt took breathing tx around 11:30 pm last night without relief.

## 2016-07-19 DIAGNOSIS — I10 Essential (primary) hypertension: Secondary | ICD-10-CM | POA: Diagnosis not present

## 2016-07-19 DIAGNOSIS — J45901 Unspecified asthma with (acute) exacerbation: Secondary | ICD-10-CM | POA: Diagnosis not present

## 2016-07-19 DIAGNOSIS — J4521 Mild intermittent asthma with (acute) exacerbation: Secondary | ICD-10-CM | POA: Diagnosis not present

## 2016-07-19 DIAGNOSIS — R739 Hyperglycemia, unspecified: Secondary | ICD-10-CM | POA: Diagnosis not present

## 2016-07-19 LAB — GLUCOSE, CAPILLARY: Glucose-Capillary: 159 mg/dL — ABNORMAL HIGH (ref 65–99)

## 2016-07-19 LAB — BASIC METABOLIC PANEL
ANION GAP: 9 (ref 5–15)
BUN: 23 mg/dL — ABNORMAL HIGH (ref 6–20)
CALCIUM: 8.9 mg/dL (ref 8.9–10.3)
CO2: 24 mmol/L (ref 22–32)
Chloride: 103 mmol/L (ref 101–111)
Creatinine, Ser: 0.9 mg/dL (ref 0.44–1.00)
Glucose, Bld: 158 mg/dL — ABNORMAL HIGH (ref 65–99)
POTASSIUM: 4.7 mmol/L (ref 3.5–5.1)
Sodium: 136 mmol/L (ref 135–145)

## 2016-07-19 LAB — HEMOGLOBIN A1C
Hgb A1c MFr Bld: 6.2 % — ABNORMAL HIGH (ref 4.8–5.6)
Mean Plasma Glucose: 131 mg/dL

## 2016-07-19 MED ORDER — METHYLPREDNISOLONE 4 MG PO TBPK: ORAL_TABLET | ORAL | 0 refills | Status: DC

## 2016-07-19 MED ORDER — FLUTICASONE-SALMETEROL 250-50 MCG/DOSE IN AEPB: 1.0000 | INHALATION_SPRAY | Freq: Two times a day (BID) | RESPIRATORY_TRACT | 0 refills | Status: DC

## 2016-07-19 MED ORDER — ALBUTEROL SULFATE (2.5 MG/3ML) 0.083% IN NEBU: 2.5000 mg | INHALATION_SOLUTION | Freq: Three times a day (TID) | RESPIRATORY_TRACT | Status: DC

## 2016-07-19 MED ORDER — MONTELUKAST SODIUM 10 MG PO TABS: 10.0000 mg | ORAL_TABLET | Freq: Every day | ORAL | 0 refills | Status: DC

## 2016-07-19 MED ORDER — PREDNISONE 5 MG PO TABS: ORAL_TABLET | ORAL | 0 refills | Status: DC

## 2016-07-19 MED ORDER — ALBUTEROL SULFATE (2.5 MG/3ML) 0.083% IN NEBU: 2.5000 mg | INHALATION_SOLUTION | Freq: Three times a day (TID) | RESPIRATORY_TRACT | 12 refills | Status: DC

## 2016-07-19 NOTE — Progress Notes (Signed)
Discharge instructions read to patient and her family member All questions answered to patient satisfaction. RX as written given to patient.  Patient discharged to home with family

## 2016-07-19 NOTE — Care Management Note (Signed)
Case Management Note  Patient Details  Name: Marilyn [name] MRN: [id number] Date of Birth: [birth year]  Expected Discharge Date:     07/19/2016             Expected Discharge Plan:  Home/Self Care  In-House Referral:  NA  Discharge planning Services  CM Consult  Post Acute Care Choice:  Durable Medical Equipment Choice offered to:  Patient  DME Arranged:  Nebulizer machine DME Agency:  [facility] Status of Service:  Completed, signed off  Additional Comments: Pt discharging home today with self care. [facility]. No further CM needs.   Malcolm Metrohildress, Dannilynn Gallina Demske, RN 07/19/2016, 10:58 AM

## 2016-07-19 NOTE — Discharge Instructions (Signed)
Follow with Primary MD NYLAND,LEONARD ROBERT, MD in 7 days  ° °Get CBC, CMP, 2 view Chest X ray checked  by Primary MD or SNF MD in 5-7 days ( we routinely change or add medications that can affect your baseline labs and fluid status, therefore we recommend that you get the mentioned basic workup next visit with your PCP, your PCP may decide not to get them or add new tests based on their clinical decision) ° ° °Activity: As tolerated with Full fall precautions use walker/cane & assistance as needed ° ° °Disposition Home   ° ° °Diet:   Heart Healthy  . ° °For Heart failure patients - Check your Weight same time everyday, if you gain over 2 pounds, or you develop in leg swelling, experience more shortness of breath or chest pain, call your Primary MD immediately. Follow Cardiac Low Salt Diet and 1.5 lit/day fluid restriction. ° ° °On your next visit with your primary care physician please Get Medicines reviewed and adjusted. ° ° °Please request your Prim.MD to go over all Hospital Tests and Procedure/Radiological results at the follow up, please get all Hospital records sent to your Prim MD by signing hospital release before you go home. ° ° °If you experience worsening of your admission symptoms, develop shortness of breath, life threatening emergency, suicidal or homicidal thoughts you must seek medical attention immediately by calling 911 or calling your MD immediately  if symptoms less severe. ° °You Must read complete instructions/literature along with all the possible adverse reactions/side effects for all the Medicines you take and that have been prescribed to you. Take any new Medicines after you have completely understood and accpet all the possible adverse reactions/side effects.  ° °Do not drive, operate heavy machinery, perform activities at heights, swimming or participation in water activities or provide baby sitting services if your were admitted for syncope or siezures until you have seen by Primary  MD or a Neurologist and advised to do so again. ° °Do not drive when taking Pain medications.  ° ° °Do not take more than prescribed Pain, Sleep and Anxiety Medications ° °Special Instructions: If you have smoked or chewed Tobacco  in the last 2 yrs please stop smoking, stop any regular Alcohol  and or any Recreational drug use. ° °Wear Seat belts while driving. ° ° °Please note ° °You were cared for by a hospitalist during your hospital stay. If you have any questions about your discharge medications or the care you received while you were in the hospital after you are discharged, you can call the unit and asked to speak with the hospitalist on call if the hospitalist that took care of you is not available. Once you are discharged, your primary care physician will handle any further medical issues. Please note that NO REFILLS for any discharge medications will be authorized once you are discharged, as it is imperative that you return to your primary care physician (or establish a relationship with a primary care physician if you do not have one) for your aftercare needs so that they can reassess your need for medications and monitor your lab values. ° ° °

## 2016-07-19 NOTE — Discharge Summary (Signed)
Marilyn [name] ZOX:[id number] DOB: 11-27-1970 DOA: 07/18/2016  PCP: Josue Hector, MD  Admit date: 07/18/2016  Discharge date: 07/19/2016  Admitted From: Home   Disposition:  Home   Recommendations for Outpatient Follow-up:   Follow up with PCP in 1 week  PCP Please obtain BMP/CBC, 2 view CXR in 1week,  (see Discharge instructions)   PCP Please follow up on the following pending results: Monitor BMP closely as she developed hypokalemia on her home dose HCTZ may require potassium supplementation on a chronic basis   Home Health: None   Equipment/Devices: Community education officer  Consultations: None Discharge Condition: Stable   CODE STATUS: Full   Diet Recommendation: Heart Healthy     Chief Complaint  Patient presents with  . Shortness of Breath     Brief history of present illness from the day of admission and additional interim summary    45 year old morbidly obese African-American female with history of hypertension, anxiety, depression, hypothyroidism and asthma was admitted with three-day history of shortness of breath and wheezing found to be due to asthma exacerbation.  Hospital issues addressed    1. Acute hypoxic respiratory failure due to acute on chronic asthma exacerbation. Resolved after IV Solu-Medrol, Singulair and Advair, wheezing and shortness of breath Completely resolved, she is back to her baseline and completely symptom-free, will be discharged on a oral prednisone taper along with nebulizer treatments as needed and Singulair plus had wet. Follow with PCP and pulmonary outpatient.  2. Hypertension. Continue Norvasc and home dose HCTZ monitor BMP closely.  3. Hypokalemia. Replaced and monitor.  4. Hypothyroidism. Continue home dose Synthroid.  5. Anxiety depression. Continue  home medications.  6. Morbid obesity and steroid-induced hyperglycemia. Follow with PCP for weight loss, sliding scale while on IV Solu Medrol.  7. OSA. Compliant with CPAP daily at bedtime.   Discharge diagnosis     Principal Problem:   Exacerbation of asthma Active Problems:   Hypokalemia   Hypertension   Depression with anxiety   Hypothyroidism   Hyperglycemia   Acute asthma exacerbation    Discharge instructions    Discharge Instructions    Diet - low sodium heart healthy    Complete by:  As directed    Discharge instructions    Complete by:  As directed    Follow with Primary MD Josue Hector, MD in 7 days   Get CBC, CMP, 2 view Chest X ray checked  by Primary MD or SNF MD in 5-7 days ( we routinely change or add medications that can affect your baseline labs and fluid status, therefore we recommend that you get the mentioned basic workup next visit with your PCP, your PCP may decide not to get them or add new tests based on their clinical decision)   Activity: As tolerated with Full fall precautions use walker/cane & assistance as needed   Disposition Home     Diet:   Heart Healthy    For Heart failure patients - Check your Weight same time everyday, if you  gain over 2 pounds, or you develop in leg swelling, experience more shortness of breath or chest pain, call your Primary MD immediately. Follow Cardiac Low Salt Diet and 1.5 lit/day fluid restriction.   On your next visit with your primary care physician please Get Medicines reviewed and adjusted.   Please request your Prim.MD to go over all Hospital Tests and Procedure/Radiological results at the follow up, please get all Hospital records sent to your Prim MD by signing hospital release before you go home.   If you experience worsening of your admission symptoms, develop shortness of breath, life threatening emergency, suicidal or homicidal thoughts you must seek medical attention immediately by  calling 911 or calling your MD immediately  if symptoms less severe.  You Must read complete instructions/literature along with all the possible adverse reactions/side effects for all the Medicines you take and that have been prescribed to you. Take any new Medicines after you have completely understood and accpet all the possible adverse reactions/side effects.   Do not drive, operate heavy machinery, perform activities at heights, swimming or participation in water activities or provide baby sitting services if your were admitted for syncope or siezures until you have seen by Primary MD or a Neurologist and advised to do so again.  Do not drive when taking Pain medications.    Do not take more than prescribed Pain, Sleep and Anxiety Medications  Special Instructions: If you have smoked or chewed Tobacco  in the last 2 yrs please stop smoking, stop any regular Alcohol  and or any Recreational drug use.  Wear Seat belts while driving.   Please note  You were cared for by a hospitalist during your hospital stay. If you have any questions about your discharge medications or the care you received while you were in the hospital after you are discharged, you can call the unit and asked to speak with the hospitalist on call if the hospitalist that took care of you is not available. Once you are discharged, your primary care physician will handle any further medical issues. Please note that NO REFILLS for any discharge medications will be authorized once you are discharged, as it is imperative that you return to your primary care physician (or establish a relationship with a primary care physician if you do not have one) for your aftercare needs so that they can reassess your need for medications and monitor your lab values.   Increase activity slowly    Complete by:  As directed       Discharge Medications     Medication List    STOP taking these medications   oxyCODONE 15 MG immediate  release tablet Commonly known as:  ROXICODONE   predniSONE 20 MG tablet Commonly known as:  DELTASONE Replaced by:  predniSONE 5 MG tablet     TAKE these medications   albuterol (2.5 MG/3ML) 0.083% nebulizer solution Commonly known as:  PROVENTIL Take 3 mLs (2.5 mg total) by nebulization 3 (three) times daily. What changed:  You were already taking a medication with the same name, and this prescription was added. Make sure you understand how and when to take each.   albuterol 108 (90 Base) MCG/ACT inhaler Commonly known as:  PROVENTIL HFA;VENTOLIN HFA Inhale 2 puffs into the lungs every 6 (six) hours as needed for wheezing or shortness of breath. What changed:  Another medication with the same name was added. Make sure you understand how and when to take each.   ALEVE  220 MG tablet Generic drug:  naproxen sodium Take 440-660 mg by mouth 2 (two) times daily as needed (Pain).   amLODipine 5 MG tablet Commonly known as:  NORVASC Take 5 mg by mouth daily.   cyclobenzaprine 10 MG tablet Commonly known as:  FLEXERIL Take 1 tablet (10 mg total) by mouth 3 (three) times daily as needed for muscle spasms.   diphenhydrAMINE 25 MG tablet Commonly known as:  BENADRYL Take 1 tablet (25 mg total) by mouth every 6 (six) hours. What changed:  when to take this  reasons to take this   escitalopram 10 MG tablet Commonly known as:  LEXAPRO Take 10 mg by mouth at bedtime.   Fluticasone-Salmeterol 250-50 MCG/DOSE Aepb Commonly known as:  ADVAIR DISKUS Inhale 1 puff into the lungs 2 (two) times daily.   hydrochlorothiazide 25 MG tablet Commonly known as:  HYDRODIURIL Take 25 mg by mouth daily.   levothyroxine 100 MCG tablet Commonly known as:  SYNTHROID, LEVOTHROID Take 100 mcg by mouth daily before breakfast.   montelukast 10 MG tablet Commonly known as:  SINGULAIR Take 1 tablet (10 mg total) by mouth at bedtime.   oxybutynin 10 MG 24 hr tablet Commonly known as:   DITROPAN-XL Take 10 mg by mouth daily.   oxyCODONE-acetaminophen 10-325 MG tablet Commonly known as:  PERCOCET Take 1-2 tablets by mouth every 6 (six) hours as needed for pain.   polyethylene glycol powder powder Commonly known as:  GLYCOLAX/MIRALAX Take 17 g by mouth daily as needed for mild constipation.   predniSONE 5 MG tablet Commonly known as:  DELTASONE Label  & dispense according to the schedule below. 8 Pills PO for 3 days, 6 Pills PO for 3 days, 4 Pills PO for 3 days, 2 Pills PO for 3 days, 1 Pills PO for 3 days, 1/2 Pill  PO for 3 days then STOP. Total 65 pills. Replaces:  predniSONE 20 MG tablet   VITAMIN C PO Take 1 tablet by mouth daily.   XTAMPZA ER 27 MG C12a Generic drug:  OxyCODONE ER Take 1 capsule by mouth every 12 (twelve) hours.       Follow-up Information    Josue Hector, MD. Schedule an appointment as soon as possible for a visit in 1 week(s).   Specialty:  Family Medicine Contact information: [address] [place] [address] [phone]        Fredirick Maudlin, MD. Schedule an appointment as soon as possible for a visit in 1 week(s).   Specialty:  Pulmonary Disease Why:  Asthma Contact information: [address] [place] [address] [phone]           Major procedures and Radiology Reports - PLEASE review detailed and final reports thoroughly  -        Dg Chest 1 View  Result Date: 07/18/2016 CLINICAL DATA:  Dyspnea this morning EXAM: CHEST 1 VIEW COMPARISON:  03/12/2015 FINDINGS: A single AP portable view of the chest demonstrates no focal airspace consolidation or alveolar edema. The lungs are grossly clear. There is no large effusion or pneumothorax. Cardiac and mediastinal contours appear unremarkable. IMPRESSION: No active disease. Electronically Signed   By: Ellery Plunk M.D.   On: 07/18/2016 06:02    Micro Results     No results found for this or any previous visit (from the past  240 hour(s)).  Today   Subjective    Marilyn [name] today has no headache,no chest abdominal pain,no new weakness tingling  or numbness, feels much better wants to go home today.     Objective   Blood pressure 140/85, pulse 92, temperature 98.1 F (36.7 C), temperature source Oral, resp. rate 20, height 5\' 3"  (1.6 m), weight (!) 146.3 kg (322 lb 8 oz), SpO2 96 %.   Intake/Output Summary (Last 24 hours) at 07/19/16 0852 Last data filed at 07/18/16 1700  Gross per 24 hour  Intake              720 ml  Output                0 ml  Net              720 ml    Exam Awake Alert, Oriented x 3, No new F.N deficits, Normal affect .AT,PERRAL Supple Neck,No JVD, No cervical lymphadenopathy appriciated.  Symmetrical Chest wall movement, Good air movement bilaterally, CTAB RRR,No Gallops,Rubs or new Murmurs, No Parasternal Heave +ve B.Sounds, Abd Soft, Non tender, No organomegaly appriciated, No rebound -guarding or rigidity. No Cyanosis, Clubbing or edema, No new Rash or bruise   Data Review   CBC w Diff:  Lab Results  Component Value Date   WBC 8.6 07/18/2016   HGB 12.5 07/18/2016   HCT 39.6 07/18/2016   PLT  07/18/2016    PLATELET CLUMPS NOTED ON SMEAR, COUNT APPEARS ADEQUATE   LYMPHOPCT 11 07/18/2016   MONOPCT 0 07/18/2016   EOSPCT 0 07/18/2016   BASOPCT 0 07/18/2016    CMP:  Lab Results  Component Value Date   NA 136 07/19/2016   K 4.7 07/19/2016   CL 103 07/19/2016   CO2 24 07/19/2016   BUN 23 (H) 07/19/2016   CREATININE 0.90 07/19/2016   PROT 7.6 03/12/2015   ALBUMIN 3.9 03/12/2015   BILITOT 1.1 03/12/2015   ALKPHOS 72 03/12/2015   AST 25 03/12/2015   ALT 29 03/12/2015  .   Total Time in preparing paper work, data evaluation and todays exam - 35 minutes  Leroy SeaSINGH,Tanish Prien K M.D on 07/19/2016 at 8:52 AM  [facility]   Office  [phone]

## 2016-08-02 ENCOUNTER — Other Ambulatory Visit (HOSPITAL_COMMUNITY): Payer: Self-pay | Admitting: Respiratory Therapy

## 2016-08-02 DIAGNOSIS — J45909 Unspecified asthma, uncomplicated: Secondary | ICD-10-CM

## 2016-08-15 ENCOUNTER — Ambulatory Visit (INDEPENDENT_AMBULATORY_CARE_PROVIDER_SITE_OTHER): Payer: Medicare Other | Admitting: Orthopaedic Surgery

## 2016-08-15 ENCOUNTER — Ambulatory Visit (INDEPENDENT_AMBULATORY_CARE_PROVIDER_SITE_OTHER): Payer: Medicare Other

## 2016-08-15 DIAGNOSIS — M25552 Pain in left hip: Secondary | ICD-10-CM

## 2016-08-15 MED ORDER — METHYLPREDNISOLONE 4 MG PO TABS: ORAL_TABLET | ORAL | 0 refills | Status: DC

## 2016-08-15 NOTE — Progress Notes (Signed)
[name] comes in in today due to acute left hip pain. This hip is been hurting for about 2 days now is somewhat in the groin. 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 Soon after hip replacement did taken to the operating room for superficial infection soft tissues. She some and is morbidly obese. She denies any recent illnesses. She does have bad asthma may feel like it is going into COPD and she actually sees her pulmonologist Friday for this. She's been off of steroids. She is on chronic pain medication as well.  On examination I can easily put her left hip the range of motion. She does have some pain with this but she is not walking with any significant limp. Her incisions well-healed. There is no redness or warmth around her incision either. I did review x-rays of her left hip today and I see no lucency around the components or periosteal reaction. At this point I will try steroid taper. She is artery on chronic pain medications well. Obviously if this is worsening we will need to see her back. She aren't has appointment with me on the severed 21st we'll have her keep that appointment but no x-rays are needed.

## 2016-08-17 ENCOUNTER — Ambulatory Visit (HOSPITAL_COMMUNITY): Payer: Medicare Other

## 2016-08-22 ENCOUNTER — Ambulatory Visit (HOSPITAL_COMMUNITY)
Admission: RE | Admit: 2016-08-22 | Discharge: 2016-08-22 | Disposition: A | Discharge status: Home / Self Care | Payer: Medicare Other | Source: Ambulatory Visit | Attending: Pulmonary Disease | Admitting: Pulmonary Disease

## 2016-08-22 DIAGNOSIS — J45909 Unspecified asthma, uncomplicated: Secondary | ICD-10-CM | POA: Insufficient documentation

## 2016-08-22 MED ORDER — ALBUTEROL SULFATE (2.5 MG/3ML) 0.083% IN NEBU
2.5000 mg | INHALATION_SOLUTION | Freq: Once | RESPIRATORY_TRACT | Status: AC
  Administered 2016-08-22: 2.5 mg via RESPIRATORY_TRACT

## 2016-08-27 ENCOUNTER — Emergency Department (HOSPITAL_COMMUNITY): Payer: Medicare Other

## 2016-08-27 ENCOUNTER — Emergency Department (HOSPITAL_COMMUNITY)
Admission: EM | Admit: 2016-08-27 | Discharge: 2016-08-28 | Disposition: A | Discharge status: Home / Self Care | Payer: Medicare Other | Attending: Emergency Medicine | Admitting: Emergency Medicine

## 2016-08-27 ENCOUNTER — Encounter (HOSPITAL_COMMUNITY): Payer: Self-pay | Admitting: Emergency Medicine

## 2016-08-27 DIAGNOSIS — E039 Hypothyroidism, unspecified: Secondary | ICD-10-CM | POA: Diagnosis not present

## 2016-08-27 DIAGNOSIS — Z79899 Other long term (current) drug therapy: Secondary | ICD-10-CM | POA: Diagnosis not present

## 2016-08-27 DIAGNOSIS — J069 Acute upper respiratory infection, unspecified: Secondary | ICD-10-CM | POA: Insufficient documentation

## 2016-08-27 DIAGNOSIS — R0602 Shortness of breath: Secondary | ICD-10-CM | POA: Diagnosis present

## 2016-08-27 DIAGNOSIS — Z87891 Personal history of nicotine dependence: Secondary | ICD-10-CM | POA: Diagnosis not present

## 2016-08-27 DIAGNOSIS — I10 Essential (primary) hypertension: Secondary | ICD-10-CM | POA: Diagnosis not present

## 2016-08-27 DIAGNOSIS — J040 Acute laryngitis: Secondary | ICD-10-CM

## 2016-08-27 DIAGNOSIS — J4521 Mild intermittent asthma with (acute) exacerbation: Secondary | ICD-10-CM | POA: Diagnosis not present

## 2016-08-27 LAB — BRAIN NATRIURETIC PEPTIDE: B Natriuretic Peptide: 42 pg/mL (ref 0.0–100.0)

## 2016-08-27 LAB — BASIC METABOLIC PANEL
ANION GAP: 12 (ref 5–15)
BUN: 13 mg/dL (ref 6–20)
CHLORIDE: 107 mmol/L (ref 101–111)
CO2: 20 mmol/L — AB (ref 22–32)
Calcium: 9.4 mg/dL (ref 8.9–10.3)
Creatinine, Ser: 0.97 mg/dL (ref 0.44–1.00)
GFR calc Af Amer: >60 mL/min (ref >60)
GFR calc non Af Amer: >60 mL/min (ref >60)
GLUCOSE: 93 mg/dL (ref 65–99)
POTASSIUM: 3.3 mmol/L — AB (ref 3.5–5.1)
Sodium: 139 mmol/L (ref 135–145)

## 2016-08-27 LAB — CBC
HEMATOCRIT: 39.7 % (ref 36.0–46.0)
HEMOGLOBIN: 12.5 g/dL (ref 12.0–15.0)
MCH: 26.5 pg (ref 26.0–34.0)
MCHC: 31.5 g/dL (ref 30.0–36.0)
MCV: 84.3 fL (ref 78.0–100.0)
Platelets: 290 10*3/uL (ref 150–400)
RBC: 4.71 MIL/uL (ref 3.87–5.11)
RDW: 15.7 % — AB (ref 11.5–15.5)
WBC: 8.9 10*3/uL (ref 4.0–10.5)

## 2016-08-27 LAB — TROPONIN I: Troponin I: <0.03 ng/mL (ref <0.03)

## 2016-08-27 MED ORDER — SODIUM CHLORIDE 0.9 % IV BOLUS (SEPSIS)
1000.0000 mL | Freq: Once | INTRAVENOUS | Status: AC
  Administered 2016-08-27: 1000 mL via INTRAVENOUS

## 2016-08-27 MED ORDER — NITROGLYCERIN 0.4 MG SL SUBL
0.4000 mg | SUBLINGUAL_TABLET | SUBLINGUAL | Status: DC | PRN
  Administered 2016-08-27 (×2): 0.4 mg via SUBLINGUAL
  Filled 2016-08-27 (×2): qty 1

## 2016-08-27 MED ORDER — SODIUM CHLORIDE 0.9 % IV BOLUS (SEPSIS): 1000.0000 mL | Freq: Once | INTRAVENOUS | Status: DC

## 2016-08-27 MED ORDER — PREDNISONE 10 MG PO TABS
60.0000 mg | ORAL_TABLET | Freq: Once | ORAL | Status: AC
  Administered 2016-08-27: 60 mg via ORAL
  Filled 2016-08-27: qty 1

## 2016-08-27 MED ORDER — PREDNISONE 10 MG (21) PO TBPK: ORAL_TABLET | ORAL | 0 refills | Status: DC

## 2016-08-27 MED ORDER — IOPAMIDOL (ISOVUE-370) INJECTION 76%
100.0000 mL | Freq: Once | INTRAVENOUS | Status: AC | PRN
  Administered 2016-08-27: 100 mL via INTRAVENOUS

## 2016-08-27 NOTE — ED Triage Notes (Signed)
Pt c/o SOB that started today. Pt also has cough. Family reports pt has been under a Dr's care for same. Tests being run. Lung sounds clear to auscultation. O2 sat 100% on RA.

## 2016-08-27 NOTE — ED Provider Notes (Signed)
[facility]-EM[facility]Y DEPT Provider Note   CSN: [id number] Arrival date & time: 08/27/16  1718   By signing my name below, I, Clarisse GougeXavier Herndon, attest that this documentation has been prepared under the direction and in the presence of Jacalyn LefevreJulie Mayleigh Tetrault, MD. Electronically signed, Clarisse GougeXavier Herndon, ED Scribe. 08/27/16. 8:36 PM.   History   Chief Complaint Chief Complaint  Patient presents with  . Shortness of Breath   The history is provided by the patient and a relative. No language interpreter was used.    HPI Comments: Marilyn [name] is a 45 y.o. female with Hx of  HTN who presents to the Emergency Department complaining of constant, moderate chest pain x 2 days. She describess the pain as pressure and she states that it feels like someone is sitting on her chest. Pt reports associated dry cough and hoarse throat. Relative states that triage believes that she may be dehydrated. Pt takes morphine at home with no relief. She denies fever .    vioce hoarse Past Medical History:  Diagnosis Date  . Anemia    taking iron now. pt states having no current issues 09/02/2015  . Anginal pain (HCC)    pt states experiences chest wall pain pt states related to her asthma   . Anxiety    with MRI's  . Arthritis   . Asthma   . Dizziness   . GERD (gastroesophageal reflux disease)   . Headache(784.0)    HX OF MIGRAINES  . History of bronchitis   . Hypertension   . Hypothyroidism    takes levothyroxen  . OSA on CPAP   . Shortness of breath    with exertion  . Wears glasses     Patient Active Problem List   Diagnosis Date Noted  . Exacerbation of asthma 07/18/2016  . Hypokalemia 07/18/2016  . Hypertension 07/18/2016  . Depression with anxiety 07/18/2016  . Hypothyroidism 07/18/2016  . Hyperglycemia 07/18/2016  . Acute asthma exacerbation 07/18/2016  . Surgical wound dehiscence left hip; questionable superficial infection 11/10/2015  . Left hip postoperative wound infection 11/10/2015  .  Osteoarthritis of left hip 09/09/2015  . 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 Obesity (BMI 35.0-39.9 without comorbidity) 04/28/2013    Past Surgical History:  Procedure Laterality Date  . CESAREAN SECTION     times 2  . CHOLECYSTECTOMY    . ENDOMETRIAL ABLATION    . INCISION AND DRAINAGE HIP Left 11/10/2015   Procedure: IRRIGATION AND DEBRIDEMENT LEFT HIP INCISION;  Surgeon: Kathryne Hitchhristopher Y Blackman, MD;  Location: [facility] OR;  Service: Orthopedics;  Laterality: Left;  . JOINT REPLACEMENT  2011   total left knee  . KNEE ARTHROPLASTY  04/23/2012   right   . KNEE ARTHROSCOPY    . ROTATOR CUFF REPAIR     left   . SHOULDER SURGERY     right to repair ligament tear  . TOTAL HIP ARTHROPLASTY Left 09/09/2015   Procedure: LEFT TOTAL HIP ARTHROPLASTY ANTERIOR APPROACH;  Surgeon: Kathryne Hitchhristopher Y Blackman, MD;  Location: [facility];  Service: Orthopedics;  Laterality: Left;  . TOTAL KNEE ARTHROPLASTY  04/23/2012   Procedure: TOTAL KNEE ARTHROPLASTY;  Surgeon: Nadara MustardMarcus V Duda, MD;  Location: [facility] OR;  Service: Orthopedics;  Laterality: Right;  Right Total Knee Arthroplasty  . TUBAL LIGATION  1996    OB History    Gravida Para Term Preterm AB Living   2 2 2     2    SAB TAB Ectopic Multiple  Live Births                   Home Medications    Prior to Admission medications   Medication Sig Start Date End Date Taking? Authorizing Provider  albuterol (PROVENTIL HFA;VENTOLIN HFA) 108 (90 BASE) MCG/ACT inhaler Inhale 2 puffs into the lungs every 6 (six) hours as needed for wheezing or shortness of breath.   Yes Historical Provider, MD  albuterol (PROVENTIL) (2.5 MG/3ML) 0.083% nebulizer solution Take 3 mLs (2.5 mg total) by nebulization 3 (three) times daily. 07/19/16  Yes Leroy Sea, MD  amLODipine (NORVASC) 5 MG tablet Take 5 mg by mouth daily.    Yes Historical Provider, MD  cyclobenzaprine (FLEXERIL) 10 MG tablet Take 1 tablet (10 mg total) by mouth 3 (three) times daily as needed for  muscle spasms. 09/12/15  Yes Kathryne Hitch, MD  escitalopram (LEXAPRO) 10 MG tablet Take 10 mg by mouth at bedtime.   Yes Historical Provider, MD  hydrochlorothiazide 25 MG tablet Take 25 mg by mouth daily.    Yes Historical Provider, MD  levothyroxine (SYNTHROID, LEVOTHROID) 100 MCG tablet Take 100 mcg by mouth daily before breakfast.   Yes Historical Provider, MD  oxybutynin (DITROPAN-XL) 10 MG 24 hr tablet Take 10 mg by mouth daily.    Yes Historical Provider, MD  oxyCODONE (ROXICODONE) 15 MG immediate release tablet Take 15 mg by mouth every 6 (six) hours as needed for pain.  08/10/16  Yes Historical Provider, MD  XTAMPZA ER 27 MG C12A Take 1 capsule by mouth every 12 (twelve) hours.  07/12/16  Yes Historical Provider, MD  diphenhydrAMINE (BENADRYL) 25 MG tablet Take 1 tablet (25 mg total) by mouth every 6 (six) hours. Patient not taking: Reported on 08/27/2016 09/29/15   Azalia Bilis, MD  predniSONE (STERAPRED UNI-PAK 21 TAB) 10 MG (21) TBPK tablet Take 6 tabs by mouth daily  for 2 days, then 5 tabs for 2 days, then 4 tabs for 2 days, then 3 tabs for 2 days, 2 tabs for 2 days, then 1 tab by mouth daily for 2 days 08/27/16   Jacalyn Lefevre, MD  RESTASIS MULTIDOSE 0.05 % ophthalmic emulsion Place 1 drop into both eyes 2 (two) times daily.  08/12/16   Historical Provider, MD    Family History Family History  Problem Relation Age of Onset  . Cancer Mother     colon  . Epilepsy Mother   . Cancer Father     prostate  . Diabetes Father   . Hypertension Father   . Hypertension Maternal Aunt   . Diabetes Maternal Aunt   . Hypertension Paternal Aunt     Social History Social History  Substance Use Topics  . Smoking status: Former Smoker    Packs/day: 0.50    Years: 4.00    Quit date: 09/18/1991  . Smokeless tobacco: Never Used  . Alcohol use No     Allergies   Lisinopril; Bee venom; Codeine; Darvocet [propoxyphene n-acetaminophen]; Meloxicam; Latex; Morphine and related; and  Tomato   Review of Systems Review of Systems  Constitutional: Negative for fever.  Respiratory: Positive for cough (dry), chest tightness and shortness of breath.   Cardiovascular: Positive for chest pain.  All other systems reviewed and are negative.    Physical Exam Updated Vital Signs BP 171/83   Pulse 73   Temp 97.6 F (36.4 C) (Temporal)   Resp 24   Ht 5\' 3"  (1.6 m)   Wt [name])  322 lb (146.1 kg)   SpO2 100%   BMI 57.04 kg/m   Physical Exam  Constitutional: She is oriented to person, place, and time. She appears well-developed and well-nourished.  Hoarse throat.  HENT:  Head: Normocephalic.  Eyes: EOM are normal.  Neck: Normal range of motion.  Cardiovascular: Normal rate, regular rhythm, normal heart sounds and intact distal pulses.   Pulmonary/Chest: Effort normal.  Abdominal: Soft. Bowel sounds are normal. She exhibits no distension.  Musculoskeletal: Normal range of motion.  Neurological: She is alert and oriented to person, place, and time.  Psychiatric: She has a normal mood and affect.  Nursing note and vitals reviewed.    ED Treatments / Results  DIAGNOSTIC STUDIES: Oxygen Saturation is 100% on RA, normal by my interpretation.    COORDINATION OF CARE: 8:14 PM Discussed treatment plan with pt at bedside and pt agreed to plan.  Labs (all labs ordered are listed, but only abnormal results are displayed) Labs Reviewed  CBC - Abnormal; Notable for the following:       Result Value   RDW 15.7 (*)    All other components within normal limits  BASIC METABOLIC PANEL - Abnormal; Notable for the following:    Potassium 3.3 (*)    CO2 20 (*)    All other components within normal limits  TROPONIN I  BRAIN NATRIURETIC PEPTIDE  TROPONIN I    EKG  EKG Interpretation  Date/Time:  Monday August 27 2016 17:24:51 EST Ventricular Rate:  75 PR Interval:  162 QRS Duration: 78 QT Interval:  392 QTC Calculation: 437 R Axis:   12 Text Interpretation:  Normal  sinus rhythm Normal ECG Confirmed by Kijuan Gallicchio MD, Ivone Licht ([id number]) on 08/27/2016 8:17:47 PM       Radiology Dg Chest 2 View  Result Date: 08/27/2016 CLINICAL DATA:  Chest pain and shortness of Breath EXAM: CHEST  2 VIEW COMPARISON:  07/18/2016 FINDINGS: The heart size and mediastinal contours are within normal limits. Both lungs are clear. The visualized skeletal structures are unremarkable. IMPRESSION: No active cardiopulmonary disease. Electronically Signed   By: Alcide CleverMark  Lukens M.D.   On: 08/27/2016 17:55   Ct Angio Chest Pe W And/or Wo Contrast  Result Date: 08/27/2016 CLINICAL DATA:  Moderate chest pain x2 days. Dry cough and hoarse throat. EXAM: CT ANGIOGRAPHY CHEST WITH CONTRAST TECHNIQUE: Multidetector CT imaging of the chest was performed using the standard protocol during bolus administration of intravenous contrast. Multiplanar CT image reconstructions and MIPs were obtained to evaluate the vascular anatomy. CONTRAST:  100 cc of Isovue 370 IV COMPARISON:  None. FINDINGS: Cardiovascular: The study is of quality for the evaluation of pulmonary embolism. There are no filling defects in the central, lobar, segmental or subsegmental pulmonary artery branches to suggest acute pulmonary embolism. Great vessels are normal in course and caliber. Normal heart size. No significant pericardial fluid/thickening. Mild aortic atherosclerosis without dissection or aneurysm. Mediastinum/Nodes: No discrete thyroid nodules. Unremarkable esophagus. No pathologically enlarged axillary, mediastinal or hilar lymph nodes. Lungs/Pleura: No pneumothorax. No pleural effusion. Left lower lobe scarring and/or atelectasis. No dominant mass. No pneumonic consolidations or effusions. No pneumothoraces. Tiny 2 mm pleural-based density in the right lower lobe, nonspecific possibly postinfectious or postinflammatory. Upper abdomen: Hepatic steatosis.  No splenomegaly.  Small splenule. Musculoskeletal:  No aggressive appearing focal  osseous lesions. Review of the MIP images confirms the above findings. IMPRESSION: No acute pulmonary embolus. Left lower lobe scarring and/or atelectasis. No acute cardiopulmonary disease. Hepatic steatosis. Electronically Signed  By: Tollie Eth M.D.   On: 08/27/2016 23:30    Procedures Procedures (including critical care time)  Medications Ordered in ED Medications  nitroGLYCERIN (NITROSTAT) SL tablet 0.4 mg (0.4 mg Sublingual Given 08/27/16 2116)  predniSONE (DELTASONE) tablet 60 mg (not administered)  sodium chloride 0.9 % bolus 1,000 mL (0 mLs Intravenous Stopped 08/27/16 2237)  iopamidol (ISOVUE-370) 76 % injection 100 mL (100 mLs Intravenous Contrast Given 08/27/16 2248)     Initial Impression / Assessment and Plan / ED Course  I have reviewed the triage vital signs and the nursing notes.  Pertinent labs & imaging results that were available during my care of the patient were reviewed by me and considered in my medical decision making (see chart for details).  Clinical Course     Pt feels much better. She is in no resp distress now or when she arrived.  Per her doctor, PFTs were normal.  Pt has some laryngitis, so I am going to add prednisone.  She knows to f/u with pcp.  Final Clinical Impressions(s) / ED Diagnoses   Final diagnoses:  Mild intermittent asthma with exacerbation  Viral upper respiratory tract infection  I personally performed the services described in this documentation, which was scribed in my presence. The recorded information has been reviewed and is accurate.   New Prescriptions New Prescriptions   PREDNISONE (STERAPRED UNI-PAK 21 TAB) 10 MG (21) TBPK TABLET    Take 6 tabs by mouth daily  for 2 days, then 5 tabs for 2 days, then 4 tabs for 2 days, then 3 tabs for 2 days, 2 tabs for 2 days, then 1 tab by mouth daily for 2 days     Jacalyn Lefevre, MD 08/27/16 [phone]

## 2016-09-06 ENCOUNTER — Ambulatory Visit (INDEPENDENT_AMBULATORY_CARE_PROVIDER_SITE_OTHER): Payer: Medicare Other | Admitting: Orthopaedic Surgery

## 2016-09-11 ENCOUNTER — Ambulatory Visit (INDEPENDENT_AMBULATORY_CARE_PROVIDER_SITE_OTHER): Payer: Medicare Other | Admitting: Orthopaedic Surgery

## 2016-10-02 ENCOUNTER — Telehealth (INDEPENDENT_AMBULATORY_CARE_PROVIDER_SITE_OTHER): Payer: Self-pay | Admitting: *Deleted

## 2016-10-02 NOTE — Telephone Encounter (Signed)
Can you call her for me and see what's going on?

## 2016-10-02 NOTE — Telephone Encounter (Signed)
Pt called asking if she could speak to someone about her hip.

## 2016-10-02 NOTE — Telephone Encounter (Signed)
Patient states that her left hip has been hurting since New Years from wearing a pair of ankle boots.  Hurts to walk, sit, and stand.  Her call back number is [phone]

## 2016-10-02 NOTE — Telephone Encounter (Signed)
See below

## 2016-10-05 NOTE — Telephone Encounter (Signed)
I did not look at my messages unfortunately.  See if she would like tramadol or tylenol #3 called in.  Eiher 1-2 every 8-12 hours as needed for pain. #60, no refills.

## 2016-10-05 NOTE — Telephone Encounter (Signed)
Patient has appt for Monday

## 2016-10-08 ENCOUNTER — Ambulatory Visit (INDEPENDENT_AMBULATORY_CARE_PROVIDER_SITE_OTHER): Payer: Medicare Other | Admitting: Orthopaedic Surgery

## 2016-10-08 ENCOUNTER — Ambulatory Visit (INDEPENDENT_AMBULATORY_CARE_PROVIDER_SITE_OTHER): Payer: Medicare Other

## 2016-10-08 DIAGNOSIS — M25552 Pain in left hip: Secondary | ICD-10-CM

## 2016-10-08 MED ORDER — TIZANIDINE HCL 4 MG PO TABS: 4.0000 mg | ORAL_TABLET | Freq: Three times a day (TID) | ORAL | 0 refills | Status: DC | PRN

## 2016-10-08 NOTE — Progress Notes (Signed)
The patient is now over a year out from a left total hip arthroplasty. She is 11 months out from an irrigation and debridement of her left hip incision. She had been doing well but she had a constant type of dull aching pain about a few weeks ago after wearing high heels. She actually has been on steroids and antibiotic recently due to upper respiratory tract infection does feel little bit better to her.  On examination her hip incisions well-healed. There is no redness or inflammation. I can put her hip through range of motion with some pain but it is not appear to be the pain associated with a chronic infection. She is walking without a significant limp at all.  An AP pelvis lateral of her left operative hip shows a well-seated implant. I see no radiolucencies around it to suggest loosening and there is no periosteal reaction around the bone suggest a chronic infection.  At this point I'll try Zanaflex as a muscle relaxant. I'll see her back in 3 months or obviously sooner if there is any issues. At that visit in 3 months I would like an AP and lateral of her left operative hip for comparative purposes.

## 2016-10-29 ENCOUNTER — Telehealth (INDEPENDENT_AMBULATORY_CARE_PROVIDER_SITE_OTHER): Payer: Self-pay | Admitting: *Deleted

## 2016-10-29 MED ORDER — CYCLOBENZAPRINE HCL 10 MG PO TABS: 10.0000 mg | ORAL_TABLET | Freq: Three times a day (TID) | ORAL | 0 refills | Status: DC | PRN

## 2016-10-29 NOTE — Telephone Encounter (Signed)
Please advise 

## 2016-10-29 NOTE — Telephone Encounter (Signed)
Pt calling about left hip. Pt stated muscle relaxer that was prescribed is not working.

## 2016-10-29 NOTE — Telephone Encounter (Signed)
I sent in some flexeril to try, but other than that, there is really nothing else.

## 2016-11-02 ENCOUNTER — Ambulatory Visit (INDEPENDENT_AMBULATORY_CARE_PROVIDER_SITE_OTHER): Payer: Medicare Other | Admitting: Family

## 2016-11-02 DIAGNOSIS — M7541 Impingement syndrome of right shoulder: Secondary | ICD-10-CM | POA: Diagnosis not present

## 2016-11-02 NOTE — Progress Notes (Signed)
Office Visit Note   Patient: Marilyn [name]           Date of Birth: July 28, 1971           MRN: [id number] Visit Date: 11/02/2016              Requested by: Joette CatchingLeonard Nyland, MD [address] [place], [place] [id number] PCP: Josue HectorNYLAND,LEONARD ROBERT, MD  No chief complaint on file.   HPI: The patient is a 46 year old woman who presents today for evaluation of right shoulder pain. She has had history of arthroscopies to bilateral shoulders for rotator cuff injuries. States that she noticed him a couple weeks ago pain lifting arms above her head. 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 No known injury. Increased pain at night. Difficulty getting comfortable in bed.    Assessment & Plan: Visit Diagnoses:  1. Impingement syndrome of right shoulder     Plan: injection today. Will follow up in office in 4 weeks if continued pain.   Follow-Up Instructions: Return in about 4 weeks (around 11/30/2016).   Physical Exam  Constitutional: Appears well-developed.  Head: Normocephalic.  Eyes: EOM are normal.  Neck: Normal range of motion.  Cardiovascular: Normal rate.   Pulmonary/Chest: Effort normal.  Neurological: Is alert.  Skin: Skin is warm.  Psychiatric: Has a normal mood and affect.  Right Shoulder Exam   Tenderness  The patient is experiencing tenderness in the biceps tendon.  Range of Motion  Active Abduction: 90  Passive Abduction: normal   Tests  Drop Arm: negative Impingement: positive  Other  Erythema: absent       Imaging: No results found.  Orders:  No orders of the defined types were placed in this encounter.  No orders of the defined types were placed in this encounter.    Procedures: Large Joint Inj Date/Time: 11/02/2016 1:23 PM Performed by: Adonis HugueninZAMORA, ERIN R Authorized by: Barnie DelZAMORA, ERIN R   Consent Given by:  Patient Site marked: the procedure site was marked   Timeout: prior to procedure the correct patient, procedure, and site was  verified   Indications:  Pain and diagnostic evaluation Location:  Shoulder Site:  R subacromial bursa Prep: patient was prepped and draped in usual sterile fashion   Needle Size:  22 G Needle Length:  1.5 inches Ultrasound Guidance: No   Fluoroscopic Guidance: No   Arthrogram: No   Medications:  5 mL lidocaine 1 %; 40 mg methylPREDNISolone acetate 40 MG/ML Aspiration Attempted: No   Patient tolerance:  Patient tolerated the procedure well with no immediate complications     Clinical Data: No additional findings.  Subjective: Review of Systems  Constitutional: Negative for chills and fever.  Musculoskeletal: Positive for arthralgias. Negative for joint swelling.  Neurological: Negative for weakness and numbness.    Objective: Vital Signs: There were no vitals taken for this visit.  Specialty Comments:  No specialty comments available.  PMFS History: Patient Active Problem List   Diagnosis Date Noted  . Exacerbation of asthma 07/18/2016  . Hypokalemia 07/18/2016  . Hypertension 07/18/2016  . Depression with anxiety 07/18/2016  . Hypothyroidism 07/18/2016  . Hyperglycemia 07/18/2016  . Acute asthma exacerbation 07/18/2016  . Surgical wound dehiscence left hip; questionable superficial infection 11/10/2015  . Left hip postoperative wound infection 11/10/2015  . Osteoarthritis of left hip 09/09/2015  . 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 Obesity (BMI 35.0-39.9 without comorbidity) 04/28/2013   Past Medical History:  Diagnosis Date  . Anemia    taking iron now. pt states having no current issues 09/02/2015  . Anginal pain (HCC)    pt states experiences chest wall pain pt states related to her asthma   . Anxiety    with MRI's  . Arthritis   . Asthma   . Dizziness   . GERD (gastroesophageal reflux disease)   . Headache(784.0)    HX OF MIGRAINES  . History of bronchitis   . Hypertension   . Hypothyroidism    takes levothyroxen  . OSA on  CPAP   . Shortness of breath    with exertion  . Wears glasses     Family History  Problem Relation Age of Onset  . Cancer Mother     colon  . Epilepsy Mother   . Cancer Father     prostate  . Diabetes Father   . Hypertension Father   . Hypertension Maternal Aunt   . Diabetes Maternal Aunt   . Hypertension Paternal Aunt     Past Surgical History:  Procedure Laterality Date  . CESAREAN SECTION     times 2  . CHOLECYSTECTOMY    . ENDOMETRIAL ABLATION    . INCISION AND DRAINAGE HIP Left 11/10/2015   Procedure: IRRIGATION AND DEBRIDEMENT LEFT HIP INCISION;  Surgeon: Kathryne Hitch, MD;  Location: [facility];  Service: Orthopedics;  Laterality: Left;  . JOINT REPLACEMENT  2011   total left knee  . KNEE ARTHROPLASTY  04/23/2012   right   . KNEE ARTHROSCOPY    . ROTATOR CUFF REPAIR     left   . SHOULDER SURGERY     right to repair ligament tear  . TOTAL HIP ARTHROPLASTY Left 09/09/2015   Procedure: LEFT TOTAL HIP ARTHROPLASTY ANTERIOR APPROACH;  Surgeon: Kathryne Hitch, MD;  Location: [facility];  Service: Orthopedics;  Laterality: Left;  . TOTAL KNEE ARTHROPLASTY  04/23/2012   Procedure: TOTAL KNEE ARTHROPLASTY;  Surgeon: [name], MD;  Location: [facility];  Service: Orthopedics;  Laterality: Right;  Right Total Knee Arthroplasty  . TUBAL LIGATION  1996   Social History   Occupational History  . Not on file.   Social History Main Topics  . Smoking status: Former Smoker    Packs/day: 0.50    Years: 4.00    Quit date: 09/18/1991  . Smokeless tobacco: Never Used  . Alcohol use No  . Drug use: No  . Sexual activity: Yes    Birth control/ protection: Surgical

## 2016-11-05 MED ORDER — LIDOCAINE HCL 1 % IJ SOLN
5.0000 mL | INTRAMUSCULAR | Status: AC | PRN
  Administered 2016-11-02: 5 mL

## 2016-11-05 MED ORDER — METHYLPREDNISOLONE ACETATE 40 MG/ML IJ SUSP
40.0000 mg | INTRAMUSCULAR | Status: AC | PRN
  Administered 2016-11-02: 40 mg via INTRA_ARTICULAR

## 2016-11-16 ENCOUNTER — Ambulatory Visit (HOSPITAL_COMMUNITY)
Admission: RE | Admit: 2016-11-16 | Discharge: 2016-11-16 | Disposition: A | Discharge status: Home / Self Care | Payer: Medicare Other | Source: Ambulatory Visit | Attending: Anesthesiology | Admitting: Anesthesiology

## 2016-11-16 ENCOUNTER — Other Ambulatory Visit (HOSPITAL_COMMUNITY): Payer: Self-pay | Admitting: Anesthesiology

## 2016-11-16 DIAGNOSIS — M5136 Other intervertebral disc degeneration, lumbar region: Secondary | ICD-10-CM | POA: Diagnosis not present

## 2016-11-16 DIAGNOSIS — M47816 Spondylosis without myelopathy or radiculopathy, lumbar region: Secondary | ICD-10-CM | POA: Insufficient documentation

## 2016-11-16 DIAGNOSIS — M47817 Spondylosis without myelopathy or radiculopathy, lumbosacral region: Secondary | ICD-10-CM | POA: Diagnosis present

## 2016-11-21 ENCOUNTER — Ambulatory Visit (INDEPENDENT_AMBULATORY_CARE_PROVIDER_SITE_OTHER): Payer: Medicare Other | Admitting: Physician Assistant

## 2016-11-29 NOTE — Progress Notes (Signed)
Office Visit Note   Patient: Marilyn [name]           Date of Birth: Feb 14, 1971           MRN: [id number] Visit Date: 11/30/2016              Requested by: Joette CatchingLeonard Nyland, MD [address] [place], [place] [id number] PCP: Josue HectorNYLAND,LEONARD ROBERT, MD  Chief Complaint  Patient presents with  . Right Shoulder - Follow-up    HPI: s/p right shoulder injection last month for impingement   The patient is a 46 year old woman who presents today for evaluation of her right shoulder. She has had impingement syndrome ongoing for several months. She is 4 weeks status post cortisone injection which she states was not helpful at all. States cortisone injections never helped for me I have had too many of them. I had physical therapy int the past with little releif.   She is status post arthroscopy bilateral shoulders for rotator cuff tears.  Pain. Complaint patient complains of decreased range of motion. Feels she is unable to reach above her head or behind her back. Complains of night pain and aching which is constant.    Assessment & Plan: Visit Diagnoses:  1. Impingement syndrome of right shoulder   2. History of rotator cuff tear     Plan: Recommended that we give physical therapy a try. Patient is in agreement with the plan. [facility] and [place] which is convenient for her. She'll follow up in 4 weeks to reevaluate her right shoulder pain.  Follow-Up Instructions: Return in about 4 weeks (around 12/28/2016), or if symptoms worsen or fail to improve.  Physical Exam  Constitutional: Appears well-developed.  Head: Normocephalic.  Eyes: EOM are normal.  Neck: Normal range of motion.  Cardiovascular: Normal rate.   Pulmonary/Chest: Effort normal.  Neurological: Is alert.  Skin: Skin is warm.  Psychiatric: Has a normal mood and affect.  Right Shoulder Exam   Tenderness  The patient is experiencing no tenderness.    Range of  Motion  Active Abduction: 90  Passive Abduction: 100  Extension: normal   Tests  Drop Arm: negative Impingement: positive  Other  Erythema: absent Pulse: present      Imaging: No results found.  Labs: Lab Results  Component Value Date   HGBA1C 6.2 (H) 07/18/2016   HGBA1C 6.2 (H) 09/02/2015   ESRSEDRATE 122 (H) 11/11/2015   CRP 11.2 (H) 11/11/2015   REPTSTATUS 10/02/2015 FINAL 09/29/2015   CULT  09/29/2015    NO GROUP A STREP (S.PYOGENES) ISOLATED [facility] 07[facility]18[facility]2012    Orders:  Orders Placed This Encounter  Procedures  . Ambulatory referral to Physical Therapy   No orders of the defined types were placed in this encounter.    Procedures: No procedures performed  Clinical Data: No additional findings.  Subjective: Review of Systems  Constitutional: Negative for chills and fever.  Musculoskeletal: Positive for arthralgias.    Objective: Vital Signs: Ht 5\' 3"  (1.6 m)   Wt (!) 322 lb (146.1 kg)   BMI 57.04 kg/m   Specialty Comments:  No specialty comments available.  PMFS History: Patient Active Problem List   Diagnosis Date Noted  . History of rotator cuff tear 11/30/2016  . Impingement syndrome of right shoulder 11/30/2016  . Exacerbation of asthma 07/18/2016  . Hypokalemia 07/18/2016  . Hypertension 07/18/2016  . Depression with  anxiety 07/18/2016  . Hypothyroidism 07/18/2016  . Hyperglycemia 07/18/2016  . Acute asthma exacerbation 07/18/2016  . Surgical wound dehiscence left hip; questionable superficial infection 11/10/2015  . Left hip postoperative wound infection 11/10/2015  . Osteoarthritis of left hip 09/09/2015  . 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 Obesity (BMI 35.0-39.9 without comorbidity) 04/28/2013   Past Medical History:  Diagnosis Date  . Anemia    taking iron now. pt states having no current issues 09/02/2015  . Anginal pain (HCC)    pt states  experiences chest wall pain pt states related to her asthma   . Anxiety    with MRI's  . Arthritis   . Asthma   . Dizziness   . GERD (gastroesophageal reflux disease)   . Headache(784.0)    HX OF MIGRAINES  . History of bronchitis   . Hypertension   . Hypothyroidism    takes levothyroxen  . OSA on CPAP   . Shortness of breath    with exertion  . Wears glasses     Family History  Problem Relation Age of Onset  . Cancer Mother     colon  . Epilepsy Mother   . Cancer Father     prostate  . Diabetes Father   . Hypertension Father   . Hypertension Maternal Aunt   . Diabetes Maternal Aunt   . Hypertension Paternal Aunt     Past Surgical History:  Procedure Laterality Date  . CESAREAN SECTION     times 2  . CHOLECYSTECTOMY    . ENDOMETRIAL ABLATION    . INCISION AND DRAINAGE HIP Left 11/10/2015   Procedure: IRRIGATION AND DEBRIDEMENT LEFT HIP INCISION;  Surgeon: Kathryne Hitch, MD;  [facility];  Service: Orthopedics;  Laterality: Left;  . JOINT REPLACEMENT  2011   total left knee  . KNEE ARTHROPLASTY  04/23/2012   right   . KNEE ARTHROSCOPY    . ROTATOR CUFF REPAIR     left   . SHOULDER SURGERY     right to repair ligament tear  . TOTAL HIP ARTHROPLASTY Left 09/09/2015   Procedure: LEFT TOTAL HIP ARTHROPLASTY ANTERIOR APPROACH;  Surgeon: Kathryne Hitch, MD;  Location: [facility];  Service: Orthopedics;  Laterality: Left;  . TOTAL KNEE ARTHROPLASTY  04/23/2012   Procedure: TOTAL KNEE ARTHROPLASTY;  Surgeon: [name], MD;  Location: [facility];  Service: Orthopedics;  Laterality: Right;  Right Total Knee Arthroplasty  . TUBAL LIGATION  1996   Social History   Occupational History  . Not on file.   Social History Main Topics  . Smoking status: Former Smoker    Packs/day: 0.50    Years: 4.00    Quit date: 09/18/1991  . Smokeless tobacco: Never Used  . Alcohol use No  . Drug use: No  . Sexual activity: Yes    Birth control/ protection: Surgical

## 2016-11-30 ENCOUNTER — Ambulatory Visit (INDEPENDENT_AMBULATORY_CARE_PROVIDER_SITE_OTHER): Payer: Medicare Other | Admitting: Family

## 2016-11-30 ENCOUNTER — Encounter (INDEPENDENT_AMBULATORY_CARE_PROVIDER_SITE_OTHER): Payer: Self-pay | Admitting: Family

## 2016-11-30 VITALS — Ht 63.0 in | Wt 322.0 lb

## 2016-11-30 DIAGNOSIS — M7541 Impingement syndrome of right shoulder: Secondary | ICD-10-CM | POA: Insufficient documentation

## 2016-11-30 DIAGNOSIS — Z8739 Personal history of other diseases of the musculoskeletal system and connective tissue: Secondary | ICD-10-CM | POA: Diagnosis not present

## 2016-12-11 ENCOUNTER — Ambulatory Visit: Payer: Medicare Other | Admitting: Physical Therapy

## 2016-12-12 ENCOUNTER — Ambulatory Visit: Payer: Medicare Other | Attending: Family | Admitting: Physical Therapy

## 2016-12-12 DIAGNOSIS — M25611 Stiffness of right shoulder, not elsewhere classified: Secondary | ICD-10-CM | POA: Diagnosis present

## 2016-12-12 DIAGNOSIS — M25511 Pain in right shoulder: Secondary | ICD-10-CM | POA: Insufficient documentation

## 2016-12-12 DIAGNOSIS — G8929 Other chronic pain: Secondary | ICD-10-CM | POA: Diagnosis present

## 2016-12-12 DIAGNOSIS — M6281 Muscle weakness (generalized): Secondary | ICD-10-CM | POA: Insufficient documentation

## 2016-12-12 NOTE — Patient Instructions (Signed)
Instructed patient in right shoulder ER with yellow theraband.  Provided theraband to patient for home use.

## 2016-12-12 NOTE — Therapy (Signed)
[facility] [address] [place], [place], [address] Phone: [phone]   Fax:  [phone]  Physical Therapy Evaluation  Patient Details  Name: Marilyn [name] MRN: [id number] Date of Birth: 08-14-1971 Referring Provider: Barnie Del  Encounter Date: 12/12/2016      PT End of Session - 12/12/16 1740    Visit Number 1   Number of Visits 12   Date for PT Re-Evaluation 01/23/17   PT Start Time 1030   PT Stop Time 1120   PT Time Calculation (min) 50 min   Activity Tolerance Patient tolerated treatment well   Behavior During Therapy [facility] for tasks assessed/performed      Past Medical History:  Diagnosis Date  . Anemia    taking iron now. pt states having no current issues 09/02/2015  . Anginal pain (HCC)    pt states experiences chest wall pain pt states related to her asthma   . Anxiety    with MRI's  . Arthritis   . Asthma   . Dizziness   . GERD (gastroesophageal reflux disease)   . Headache(784.0)    HX OF MIGRAINES  . History of bronchitis   . Hypertension   . Hypothyroidism    takes levothyroxen  . OSA on CPAP   . Shortness of breath    with exertion  . Wears glasses     Past Surgical History:  Procedure Laterality Date  . CESAREAN SECTION     times 2  . CHOLECYSTECTOMY    . ENDOMETRIAL ABLATION    . INCISION AND DRAINAGE HIP Left 11/10/2015   Procedure: IRRIGATION AND DEBRIDEMENT LEFT HIP INCISION;  Surgeon: Kathryne Hitch, MD;  [facility];  Service: Orthopedics;  Laterality: Left;  . JOINT REPLACEMENT  2011   total left knee  . KNEE ARTHROPLASTY  04/23/2012   right   . KNEE ARTHROSCOPY    . ROTATOR CUFF REPAIR     left   . SHOULDER SURGERY     right to repair ligament tear  . TOTAL HIP ARTHROPLASTY Left 09/09/2015   Procedure: LEFT TOTAL HIP ARTHROPLASTY ANTERIOR APPROACH;  Surgeon: Kathryne Hitch, MD;  [facility];  Service: Orthopedics;  Laterality: Left;  . TOTAL KNEE  ARTHROPLASTY  04/23/2012   Procedure: TOTAL KNEE ARTHROPLASTY;  Surgeon: [name], MD;  [facility];  Service: Orthopedics;  Laterality: Right;  Right Total Knee Arthroplasty  . TUBAL LIGATION  1996    There were no vitals filed for this visit.       Subjective Assessment - 12/12/16 1714    Subjective The patient reports ongoing and worsening right shoulder pain.  She has had surgery to both shoulders.  Pain increases to a 8+/10 with certain movements.  Rest decreases her pain.  Patient to consult with her doctor as she feels pain in her left shoulder as well.   Pertinent History Previous right shoulder surgery.   Patient Stated Goals Use right UE without.   Currently in Pain? Yes   Pain Score 5    Pain Location Shoulder   Pain Orientation Right   Pain Descriptors / Indicators Aching;Stabbing;Sharp;Throbbing   Pain Type Chronic pain   Pain Onset More than a month ago   Pain Frequency Constant   Aggravating Factors  See above.   Pain Relieving Factors See above.   Effect of Pain on Daily Activities See above.            [facility]  PT Assessment - 12/12/16 0001      Assessment   Medical Diagnosis Right shoulder Impingement.   Referring Provider Barnie DelErin Zamora     Precautions   Precautions None     Restrictions   Weight Bearing Restrictions No     Balance Screen   Has the patient fallen in the past 6 months No   Has the patient had a decrease in activity level because of a fear of falling?  No   Is the patient reluctant to leave their home because of a fear of falling?  No     Home Environment   Living Environment Private residence     Prior Function   Level of Independence Independent     Posture/Postural Control   Posture/Postural Control Postural limitations   Postural Limitations Rounded Shoulders;Forward head     AROM   Overall AROM Comments Right shoulder flexion to 140 degrees; ER= 40 degrees; behind back to right gluteal region.     Strength   Overall  Strength Comments Right shoulder ER= 4-/5 and IR= 4+/5.     Palpation   Palpation comment tender to palpation over right Infraspinatus especially near the humeral attachment.     Special Tests    Special Tests Rotator Cuff Impingement   Rotator Cuff Impingment tests Neer impingement test;Hawkins- Kennedy test     Neer Impingement test    Findings Positive   Side Right     Hawkins-Kennedy test   Findings Positive   Side Right                   [facility] Adult PT Treatment/Exercise - 12/12/16 0001      Modalities   Modalities Electrical Stimulation;Moist Heat     Moist Heat Therapy   Number Minutes Moist Heat --  15 minutes.     Programme researcher, broadcasting/film/videolectrical Stimulation   Electrical Stimulation Location Right posterior shoulder.   Electrical Stimulation Action Constant Pre-mod.   Electrical Stimulation Parameters 80-150 Hz x 15 minutes.   Electrical Stimulation Goals Pain                PT Education - 12/12/16 1720    Education provided Yes   Education Details HEP.   Person(s) Educated Patient   Methods Explanation;Demonstration;Handout   Comprehension Returned demonstration;Verbalized understanding;Tactile cues required          PT Short Term Goals - 03/19/16 1108      PT SHORT TERM GOAL #1   Title Ind with a comprehensive HEP.   Time 4   Period Weeks   Status New           PT Long Term Goals - 12/12/16 1736      PT LONG TERM GOAL #1   Title independent with a HEP.   Time 6   Status New     PT LONG TERM GOAL #2   Title Active right shoulder flexion to 155 degrees so the patient can easily reach overhead   Time 6   Period Weeks   Status New     PT LONG TERM GOAL #3   Title Active ER to 75 degrees+ to allow for easily donning/doffing of apparel   Time 6   Period Weeks   Status New     PT LONG TERM GOAL #4   Title Increase ROM so patient is able to reach behind back to L4 with right hand.   Time 6   Period Weeks   Status New  PT LONG TERM  GOAL #5   Title Increase right  shoulder ER strength to a solid 4+/5 to increase stability for performance of functional activities   Time 6   Period Weeks   Status New     PT LONG TERM GOAL #6   Title Perform ADL's with right shoulder pain not > 3/10.   Time 6   Period Weeks   Status New               Plan - January 07, 2017 1728    Clinical Impression Statement Patient presents with chronic right shoulder pain with a subsquent loss of range of motion and strength especially into ER.  Impingement testing is positive.  Limitations impair patient's ability to perform ADL's.  Patient will benefit from skilled physical therapy to include ROM and RTC and scapular strengthening exercises as well as modalites PRN.   Rehab Potential Excellent   PT Frequency 2x / week   PT Duration 6 weeks   PT Treatment/Interventions ADLs/Self Care Home Management;Cryotherapy;Electrical Stimulation;Moist Heat;Ultrasound;Patient/family education;Therapeutic exercise;Therapeutic activities;Manual techniques;Passive range of motion;Dry needling;Vasopneumatic Device   PT Next Visit Plan Please perform combo e'stim/U/S to right Infraspinatus f/b STW/M including IASTM.  PAIN-FREE pulleys, UE Ranger; PROM; RW 4; scapular strengthening, full can and SDLY ER.  Other modalites and dry needling if needed.   Consulted and Agree with Plan of Care Patient      Patient will benefit from skilled therapeutic intervention in order to improve the following deficits and impairments:  Pain, Decreased activity tolerance, Decreased strength, Decreased range of motion  Visit Diagnosis: Chronic right shoulder pain - Plan: PT plan of care cert/re-cert  Stiffness of right shoulder, not elsewhere classified - Plan: PT plan of care cert/re-cert  Muscle weakness (generalized) - Plan: PT plan of care cert/re-cert      G-Codes - 01/07/2017 1735    Functional Assessment Tool Used (Outpatient Only) Clinical judgement.   Functional  Limitation Self care   Mobility: Walking and Moving Around Current Status [phone]) At least 20 percent but less than 40 percent impaired, limited or restricted   Mobility: Walking and Moving Around Goal Status [phone]) At least 1 percent but less than 20 percent impaired, limited or restricted       Problem List Patient Active Problem List   Diagnosis Date Noted  . History of rotator cuff tear 11/30/2016  . Impingement syndrome of right shoulder 11/30/2016  . Exacerbation of asthma 07/18/2016  . Hypokalemia 07/18/2016  . Hypertension 07/18/2016  . Depression with anxiety 07/18/2016  . Hypothyroidism 07/18/2016  . Hyperglycemia 07/18/2016  . Acute asthma exacerbation 07/18/2016  . Surgical wound dehiscence left hip; questionable superficial infection 11/10/2015  . Left hip postoperative wound infection 11/10/2015  . Osteoarthritis of left hip 09/09/2015  . 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 Obesity (BMI 35.0-39.9 without comorbidity) 04/28/2013    Milyn Stapleton, [place] MPT 01-07-17, 5:42 PM  [facility] [address] [place], [place], [address] Phone: [phone]   Fax:  [phone]  Name: Marilyn [name] MRN: [id number] Date of Birth: [birth year]

## 2016-12-18 ENCOUNTER — Encounter: Payer: Medicare Other | Admitting: Physical Therapy

## 2016-12-20 ENCOUNTER — Ambulatory Visit: Payer: Medicare Other | Attending: Family | Admitting: Physical Therapy

## 2016-12-20 ENCOUNTER — Encounter: Payer: Self-pay | Admitting: Physical Therapy

## 2016-12-20 DIAGNOSIS — M25511 Pain in right shoulder: Secondary | ICD-10-CM | POA: Diagnosis present

## 2016-12-20 DIAGNOSIS — G8929 Other chronic pain: Secondary | ICD-10-CM | POA: Diagnosis present

## 2016-12-20 DIAGNOSIS — M25611 Stiffness of right shoulder, not elsewhere classified: Secondary | ICD-10-CM | POA: Insufficient documentation

## 2016-12-20 DIAGNOSIS — M6281 Muscle weakness (generalized): Secondary | ICD-10-CM

## 2016-12-20 NOTE — Therapy (Signed)
[facility], [name], [address] Phone: [phone]   Fax:  [phone]  Physical Therapy Treatment  Patient Details  Name: Marilyn [name] MRN: [id number] Date of Birth: [birth year] Referring Provider: Dondra Prader  Encounter Date: 12/20/2016      PT End of Session - 12/20/16 1412    Visit Number 2   Number of Visits 12   Date for PT Re-Evaluation 01/23/17   PT Start Time 1230   PT Stop Time 1303   PT Time Calculation (min) 33 min   Activity Tolerance Patient tolerated treatment well   Behavior During Therapy [facility] for tasks assessed/performed      Past Medical History:  Diagnosis Date  . Anemia    taking iron now. pt states having no current issues 09/02/2015  . Anginal pain ([place])    pt states experiences chest wall pain pt states related to her asthma   . Anxiety    with MRI's  . Arthritis   . Asthma   . Dizziness   . GERD (gastroesophageal reflux disease)   . Headache(784.0)    HX OF MIGRAINES  . History of bronchitis   . Hypertension   . Hypothyroidism    takes levothyroxen  . OSA on CPAP   . Shortness of breath    with exertion  . Wears glasses     Past Surgical History:  Procedure Laterality Date  . CESAREAN SECTION     times 2  . CHOLECYSTECTOMY    . ENDOMETRIAL ABLATION    . INCISION AND DRAINAGE HIP Left 11/10/2015   Procedure: IRRIGATION AND DEBRIDEMENT LEFT HIP INCISION;  Surgeon: Mcarthur Rossetti, MD;  Location: [place];  Service: Orthopedics;  Laterality: Left;  . JOINT REPLACEMENT  2011   total left knee  . KNEE ARTHROPLASTY  04/23/2012   right   . KNEE ARTHROSCOPY    . ROTATOR CUFF REPAIR     left   . SHOULDER SURGERY     right to repair ligament tear  . TOTAL HIP ARTHROPLASTY Left 09/09/2015   Procedure: LEFT TOTAL HIP ARTHROPLASTY ANTERIOR APPROACH;  Surgeon: Mcarthur Rossetti, MD;  [facility];  Service: Orthopedics;  Laterality: Left;  . TOTAL KNEE  ARTHROPLASTY  04/23/2012   Procedure: TOTAL KNEE ARTHROPLASTY;  Surgeon: Newt Minion, MD;  Location: [place];  Service: Orthopedics;  Laterality: Right;  Right Total Knee Arthroplasty  . TUBAL LIGATION  1996    There were no vitals filed for this visit.      Subjective Assessment - 12/20/16 1408    Subjective Patient arriving to therapy today reporting 6/10 R shoulder pain. Pt reporting certain movements make her pain worse.    Pertinent History Previous right shoulder surgery.   Limitations Sitting;Standing   How long can you sit comfortably? 20 minutes.   How long can you stand comfortably? 15-20 minutes.   Patient Stated Goals Use right UE without.   Currently in Pain? Yes   Pain Score 6    Pain Location Shoulder   Pain Orientation Right   Pain Descriptors / Indicators Aching;Throbbing   Pain Type Chronic pain   Pain Onset More than a month ago   Pain Frequency Constant   Aggravating Factors  certain movements   Pain Relieving Factors changing positions, resting   Effect of Pain on Daily Activities difficutly reaching and performing ADL's            [facility] PT Assessment -  12/20/16 0001      Assessment   Medical Diagnosis Right shoulder Impingement.   Referring Provider Dondra Prader     Precautions   Precautions None     Restrictions   Weight Bearing Restrictions No     Balance Screen   Has the patient fallen in the past 6 months No   Has the patient had a decrease in activity level because of a fear of falling?  No   Is the patient reluctant to leave their home because of a fear of falling?  No     Home Environment   Living Environment Private residence                     [facility] Adult PT Treatment/Exercise - 12/20/16 0001      Posture/Postural Control   Posture/Postural Control Postural limitations   Postural Limitations Rounded Shoulders;Forward head     Exercises   Exercises Shoulder     Shoulder Exercises: Seated   Other Seated Exercises  UE ranger: 30 reps counter clock wise and clockwise, flexion     Shoulder Exercises: Pulleys   Flexion 3 minutes     Modalities   Modalities Electrical Stimulation;Ultrasound     Electrical Stimulation   Electrical Stimulation Location Right posterior shoulder.   Electrical Stimulation Action IFC    Electrical Stimulation Parameters 80-150 Hz x 15 minutes, intensity to pt's tolerance   Electrical Stimulation Goals Pain     Ultrasound   Ultrasound Location R posterior shoulder, 1 mHz, 100% duty cycle, 1.2 w.cm2 x 8 minutes   Ultrasound Goals Pain                  PT Short Term Goals - 03/19/16 1108      PT SHORT TERM GOAL #1   Title Ind with a comprehensive HEP.   Time 4   Period Weeks   Status New           PT Long Term Goals - 12/12/16 1736      PT LONG TERM GOAL #1   Title independent with a HEP.   Time 6   Status New     PT LONG TERM GOAL #2   Title Active right shoulder flexion to 155 degrees so the patient can easily reach overhead   Time 6   Period Weeks   Status New     PT LONG TERM GOAL #3   Title Active ER to 75 degrees+ to allow for easily donning/doffing of apparel   Time 6   Period Weeks   Status New     PT LONG TERM GOAL #4   Title Increase ROM so patient is able to reach behind back to L4 with right hand.   Time 6   Period Weeks   Status New     PT LONG TERM GOAL #5   Title Increase right  shoulder ER strength to a solid 4+/5 to increase stability for performance of functional activities   Time 6   Period Weeks   Status New     PT LONG TERM GOAL #6   Title Perform ADL's with right shoulder pain not > 3/10.   Time 6   Period Weeks   Status New               Plan - 12/20/16 1413    Clinical Impression Statement Patient presenting with R shoulder pain with decreased ROM. Pt tolerating pulley and UE ranger  exercises well today. Discussed increasing pt's exercise plan at next visit. [place] and E-stim performed today for  pain and pt reporting 3/10 pain at end of session. Continue with skilled PT to progress toward goals set. No goals met this visit.    Rehab Potential Excellent   PT Frequency 2x / week   PT Duration 6 weeks   PT Treatment/Interventions ADLs/Self Care Home Management;Cryotherapy;Electrical Stimulation;Moist Heat;Ultrasound;Patient/family education;Therapeutic exercise;Therapeutic activities;Manual techniques;Passive range of motion;Dry needling;Vasopneumatic Device   PT Next Visit Plan Please perform combo e'stim/U/S to right Infraspinatus f/b STW/M including IASTM.  PAIN-FREE pulleys, UE Ranger; PROM; RW 4; scapular strengthening, full can and SDLY ER.  Other modalites and dry needling if needed.   Consulted and Agree with Plan of Care Patient      Patient will benefit from skilled therapeutic intervention in order to improve the following deficits and impairments:  Pain, Decreased activity tolerance, Decreased strength, Decreased range of motion  Visit Diagnosis: Chronic right shoulder pain  Stiffness of right shoulder, not elsewhere classified  Muscle weakness (generalized)     Problem List Patient Active Problem List   Diagnosis Date Noted  . History of rotator cuff tear 11/30/2016  . Impingement syndrome of right shoulder 11/30/2016  . Exacerbation of asthma 07/18/2016  . Hypokalemia 07/18/2016  . Hypertension 07/18/2016  . Depression with anxiety 07/18/2016  . Hypothyroidism 07/18/2016  . Hyperglycemia 07/18/2016  . Acute asthma exacerbation 07/18/2016  . Surgical wound dehiscence left hip; questionable superficial infection 11/10/2015  . Left hip postoperative wound infection 11/10/2015  . Osteoarthritis of left hip 09/09/2015  . 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 Obesity (BMI 35.0-39.9 without comorbidity) 04/28/2013    Oretha Caprice, MPT 12/20/2016, 2:16 PM  [facility] [address] [place], [name], [address] Phone: [phone]   Fax:  [phone]  Name: Marilyn [name] MRN: [id number] Date of Birth: Dec 05, 1970

## 2016-12-21 ENCOUNTER — Ambulatory Visit (INDEPENDENT_AMBULATORY_CARE_PROVIDER_SITE_OTHER): Payer: Medicare Other | Admitting: Family

## 2016-12-21 ENCOUNTER — Encounter: Payer: Medicare Other | Admitting: Physical Therapy

## 2016-12-21 DIAGNOSIS — M7541 Impingement syndrome of right shoulder: Secondary | ICD-10-CM | POA: Diagnosis not present

## 2016-12-21 NOTE — Progress Notes (Signed)
Office Visit Note   Patient: Marilyn [name]           Date of Birth: 09/30/1970           MRN: [id number] Visit Date: 12/21/2016              Requested by: Joette Catching, MD [address] [place], [place] [address] PCP: Josue Hector, MD  No chief complaint on file.   HPI: The patient is a 46 year old woman who presents today for evaluation of her right shoulder. She has had impingement syndrome ongoing for several months. She is 4 weeks status post cortisone injection which she states was not helpful at all. States cortisone injections never helped for me I have had too many of them. has had physical therapy int the past with little releif.   She is status post arthroscopy bilateral shoulders x 2 each shoulder.  Pain. Complaint patient complains of decreased range of motion. Feels she has pain with above head reaching and behind her back reaching. Complains of night pain and aching which is constant.  Has had 2 visits with PT, has made little improvement thus far.    Assessment & Plan: Visit Diagnoses:  1. Impingement syndrome of right shoulder     Plan: Recommended that we continue with physical therapy. Patient is in agreement with the plan. She'll follow up in 4 weeks to reevaluate her right shoulder pain with Dr. Lajoyce Corners as she is concerned she may require further surgical intervention.  Follow-Up Instructions: Return in about 4 weeks (around 01/18/2017).  Physical Exam  Constitutional: Appears well-developed.  Head: Normocephalic.  Eyes: EOM are normal.  Neck: Normal range of motion.  Cardiovascular: Normal rate.   Pulmonary/Chest: Effort normal.  Neurological: Is alert.  Skin: Skin is warm.  Psychiatric: Has a normal mood and affect.  Right Shoulder Exam   Tenderness  The patient is experiencing no tenderness.    Range of Motion  Active Abduction: 110   Tests  Drop Arm: negative Impingement: positive  Other  Erythema: absent Pulse:  present      Imaging: No results found.  Labs: Lab Results  Component Value Date   HGBA1C 6.2 (H) 07/18/2016   HGBA1C 6.2 (H) 09/02/2015   ESRSEDRATE 122 (H) 11/11/2015   CRP 11.2 (H) 11/11/2015   REPTSTATUS 10/02/2015 FINAL 09/29/2015   CULT  09/29/2015    NO GROUP A STREP (S.PYOGENES) ISOLATED Performed at [facility]    [facility] ESCHERICHIA COLI 07/18/2012    Orders:  No orders of the defined types were placed in this encounter.  No orders of the defined types were placed in this encounter.    Procedures: No procedures performed  Clinical Data: No additional findings.  Subjective: Review of Systems  Constitutional: Negative for chills and fever.  Musculoskeletal: Positive for arthralgias.    Objective: Vital Signs: There were no vitals taken for this visit.  Specialty Comments:  No specialty comments available.  PMFS History: Patient Active Problem List   Diagnosis Date Noted  . History of rotator cuff tear 11/30/2016  . Impingement syndrome of right shoulder 11/30/2016  . Exacerbation of asthma 07/18/2016  . Hypokalemia 07/18/2016  . Hypertension 07/18/2016  . Depression with anxiety 07/18/2016  . Hypothyroidism 07/18/2016  . Hyperglycemia 07/18/2016  . Acute asthma exacerbation 07/18/2016  . Surgical wound dehiscence left hip; questionable superficial infection 11/10/2015  . Left hip postoperative wound infection 11/10/2015  . Osteoarthritis of left hip 09/09/2015  . 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 Obesity (BMI 35.0-39.9 without comorbidity) 04/28/2013   Past Medical History:  Diagnosis Date  . Anemia    taking iron now. pt states having no current issues 09/02/2015  . Anginal pain (HCC)    pt states experiences chest wall pain pt states related to her asthma   . Anxiety    with MRI's  . Arthritis   . Asthma   . Dizziness   . GERD (gastroesophageal reflux disease)   . Headache(784.0)    HX OF MIGRAINES   . History of bronchitis   . Hypertension   . Hypothyroidism    takes levothyroxen  . OSA on CPAP   . Shortness of breath    with exertion  . Wears glasses     Family History  Problem Relation Age of Onset  . Cancer Mother     colon  . Epilepsy Mother   . Cancer Father     prostate  . Diabetes Father   . Hypertension Father   . Hypertension Maternal Aunt   . Diabetes Maternal Aunt   . Hypertension Paternal Aunt     Past Surgical History:  Procedure Laterality Date  . CESAREAN SECTION     times 2  . CHOLECYSTECTOMY    . ENDOMETRIAL ABLATION    . INCISION AND DRAINAGE HIP Left 11/10/2015   Procedure: IRRIGATION AND DEBRIDEMENT LEFT HIP INCISION;  Surgeon: Kathryne Hitch, MD;  Location: [facility];  Service: Orthopedics;  Laterality: Left;  . JOINT REPLACEMENT  2011   total left knee  . KNEE ARTHROPLASTY  04/23/2012   right   . KNEE ARTHROSCOPY    . ROTATOR CUFF REPAIR     left   . SHOULDER SURGERY     right to repair ligament tear  . TOTAL HIP ARTHROPLASTY Left 09/09/2015   Procedure: LEFT TOTAL HIP ARTHROPLASTY ANTERIOR APPROACH;  Surgeon: Kathryne Hitch, MD;  Location: [facility];  Service: Orthopedics;  Laterality: Left;  . TOTAL KNEE ARTHROPLASTY  04/23/2012   Procedure: TOTAL KNEE ARTHROPLASTY;  Surgeon: [name], MD;  Location: [facility] OR;  Service: Orthopedics;  Laterality: Right;  Right Total Knee Arthroplasty  . TUBAL LIGATION  1996   Social History   Occupational History  . Not on file.   Social History Main Topics  . Smoking status: Former Smoker    Packs/day: 0.50    Years: 4.00    Quit date: 09/18/1991  . Smokeless tobacco: Never Used  . Alcohol use No  . Drug use: No  . Sexual activity: Yes    Birth control/ protection: Surgical

## 2016-12-25 ENCOUNTER — Encounter: Payer: Self-pay | Admitting: Physical Therapy

## 2016-12-25 ENCOUNTER — Ambulatory Visit: Payer: Medicare Other | Admitting: Physical Therapy

## 2016-12-25 DIAGNOSIS — M25611 Stiffness of right shoulder, not elsewhere classified: Secondary | ICD-10-CM

## 2016-12-25 DIAGNOSIS — M25511 Pain in right shoulder: Principal | ICD-10-CM

## 2016-12-25 DIAGNOSIS — G8929 Other chronic pain: Secondary | ICD-10-CM

## 2016-12-25 DIAGNOSIS — M6281 Muscle weakness (generalized): Secondary | ICD-10-CM

## 2016-12-25 NOTE — Therapy (Signed)
[facility] [address] [place], [place], [address] Phone: [phone]   Fax:  [phone]  Physical Therapy Treatment  Patient Details  Name: Marilyn [name] MRN: [id number] Date of Birth: 21-Nov-1970 Referring Provider: Barnie Del  Encounter Date: 12/25/2016      PT End of Session - 12/25/16 1353    Visit Number 3   Number of Visits 12   Date for PT Re-Evaluation 01/23/17   PT Start Time 1350   PT Stop Time 1438   PT Time Calculation (min) 48 min   Activity Tolerance Patient tolerated treatment well   Behavior During Therapy [facility] for tasks assessed/performed      Past Medical History:  Diagnosis Date  . Anemia    taking iron now. pt states having no current issues 09/02/2015  . Anginal pain (HCC)    pt states experiences chest wall pain pt states related to her asthma   . Anxiety    with MRI's  . Arthritis   . Asthma   . Dizziness   . GERD (gastroesophageal reflux disease)   . Headache(784.0)    HX OF MIGRAINES  . History of bronchitis   . Hypertension   . Hypothyroidism    takes levothyroxen  . OSA on CPAP   . Shortness of breath    with exertion  . Wears glasses     Past Surgical History:  Procedure Laterality Date  . CESAREAN SECTION     times 2  . CHOLECYSTECTOMY    . ENDOMETRIAL ABLATION    . INCISION AND DRAINAGE HIP Left 11/10/2015   Procedure: IRRIGATION AND DEBRIDEMENT LEFT HIP INCISION;  Surgeon: Kathryne Hitch, MD;  Location: [facility];  Service: Orthopedics;  Laterality: Left;  . JOINT REPLACEMENT  2011   total left knee  . KNEE ARTHROPLASTY  04/23/2012   right   . KNEE ARTHROSCOPY    . ROTATOR CUFF REPAIR     left   . SHOULDER SURGERY     right to repair ligament tear  . TOTAL HIP ARTHROPLASTY Left 09/09/2015   Procedure: LEFT TOTAL HIP ARTHROPLASTY ANTERIOR APPROACH;  Surgeon: Kathryne Hitch, MD;  Location: [facility];  Service: Orthopedics;  Laterality: Left;  . TOTAL KNEE  ARTHROPLASTY  04/23/2012   Procedure: TOTAL KNEE ARTHROPLASTY;  Surgeon: [name], MD;  Location: [facility];  Service: Orthopedics;  Laterality: Right;  Right Total Knee Arthroplasty  . TUBAL LIGATION  1996    There were no vitals filed for this visit.      Subjective Assessment - 12/25/16 1352    Subjective "Its bad today."   Pertinent History Previous right shoulder surgery.   Limitations Sitting;Standing   How long can you sit comfortably? 20 minutes.   How long can you stand comfortably? 15-20 minutes.   Patient Stated Goals Use right UE without.   Currently in Pain? Yes   Pain Score 6    Pain Location Shoulder   Pain Orientation Right   Pain Descriptors / Indicators Aching;Throbbing;Sharp  sharp pains intermittantly   Pain Type Chronic pain   Pain Radiating Towards to R elbow   Pain Onset More than a month ago            [facility] PT Assessment - 12/25/16 0001      Assessment   Medical Diagnosis Right shoulder Impingement.   Next MD Visit "2 weeks"     Precautions   Precautions None     Restrictions  Weight Bearing Restrictions No                     [facility] Adult PT Treatment/Exercise - 12/25/16 0001      Shoulder Exercises: Seated   Extension Strengthening;Right;20 reps;Theraband   Theraband Level (Shoulder Extension) Level 1 (Yellow)   Row Strengthening;Right;20 reps;Theraband   Theraband Level (Shoulder Row) Level 1 (Yellow)   External Rotation Strengthening;Right;20 reps;Theraband   Theraband Level (Shoulder External Rotation) Level 1 (Yellow)   Internal Rotation Strengthening;Right;20 reps;Theraband   Theraband Level (Shoulder Internal Rotation) Level 1 (Yellow)     Shoulder Exercises: Standing   Flexion AROM;Both;20 reps   Other Standing Exercises B shoulder scaption AROM x20 reps   Other Standing Exercises R wall slides x15 reps     Shoulder Exercises: Pulleys   Flexion Other (comment)  x5 min     Modalities   Modalities Electrical  Stimulation;Moist Heat;Ultrasound     Moist Heat Therapy   Number Minutes Moist Heat 15 Minutes   Moist Heat Location Shoulder     Electrical Stimulation   Electrical Stimulation Location R posterior shoulder   Electrical Stimulation Action Pre-Mod   Electrical Stimulation Parameters 80-150 hz x15 min   Electrical Stimulation Goals Pain;Tone     Ultrasound   Ultrasound Location R posteriolateral shoulder   Ultrasound Parameters 1.5 w/cm2, 100%, 1 mhz x10 min   Ultrasound Goals Pain     Manual Therapy   Manual Therapy Soft tissue mobilization   Soft tissue mobilization STW to posterior shoulder and deltoids to reduce tone and pain in sitting                     PT Long Term Goals - 12/12/16 1736      PT LONG TERM GOAL #1   Title independent with a HEP.   Time 6   Status New     PT LONG TERM GOAL #2   Title Active right shoulder flexion to 155 degrees so the patient can easily reach overhead   Time 6   Period Weeks   Status New     PT LONG TERM GOAL #3   Title Active ER to 75 degrees+ to allow for easily donning/doffing of apparel   Time 6   Period Weeks   Status New     PT LONG TERM GOAL #4   Title Increase ROM so patient is able to reach behind back to L4 with right hand.   Time 6   Period Weeks   Status New     PT LONG TERM GOAL #5   Title Increase right  shoulder ER strength to a solid 4+/5 to increase stability for performance of functional activities   Time 6   Period Weeks   Status New     PT LONG TERM GOAL #6   Title Perform ADL's with right shoulder pain not > 3/10.   Time 6   Period Weeks   Status New               Plan - 12/25/16 1516    Clinical Impression Statement Patient tolerated today's treatment fairly well as she arrived with 6/10 R shoulder pain. Patient guided through various strengthening and ROM exercises with minimal pain noted with seated extension, ER and with standing scaption and wall slides. Continued tone  noted in R posterior shoulder as well as R deltoids region. Normal modalities response noted following removal of the modalities.  Rehab Potential Excellent   PT Frequency 2x / week   PT Duration 6 weeks   PT Treatment/Interventions ADLs/Self Care Home Management;Cryotherapy;Electrical Stimulation;Moist Heat;Ultrasound;Patient/family education;Therapeutic exercise;Therapeutic activities;Manual techniques;Passive range of motion;Dry needling;Vasopneumatic Device   PT Next Visit Plan Please perform combo e'stim/U/S to right Infraspinatus f/b STW/M including IASTM.  PAIN-FREE pulleys, UE Ranger; PROM; RW 4; scapular strengthening, full can and SDLY ER.  Other modalites and dry needling if needed.   Consulted and Agree with Plan of Care Patient      Patient will benefit from skilled therapeutic intervention in order to improve the following deficits and impairments:  Pain, Decreased activity tolerance, Decreased strength, Decreased range of motion  Visit Diagnosis: Chronic right shoulder pain  Stiffness of right shoulder, not elsewhere classified  Muscle weakness (generalized)     Problem List Patient Active Problem List   Diagnosis Date Noted  . History of rotator cuff tear 11/30/2016  . Impingement syndrome of right shoulder 11/30/2016  . Exacerbation of asthma 07/18/2016  . Hypokalemia 07/18/2016  . Hypertension 07/18/2016  . Depression with anxiety 07/18/2016  . Hypothyroidism 07/18/2016  . Hyperglycemia 07/18/2016  . Acute asthma exacerbation 07/18/2016  . Surgical wound dehiscence left hip; questionable superficial infection 11/10/2015  . Left hip postoperative wound infection 11/10/2015  . Osteoarthritis of left hip 09/09/2015  . 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 Obesity (BMI 35.0-39.9 without comorbidity) 04/28/2013    Evelene Croon, PTA 12/25/2016, 3:19 PM  [facility] [address] [place],  [place], [address] Phone: [phone]   Fax:  [phone]  Name: MEKIAH WAHLER MRN: [id number] Date of Birth: [birth year]

## 2016-12-28 ENCOUNTER — Encounter: Payer: Self-pay | Admitting: Physical Therapy

## 2016-12-28 ENCOUNTER — Ambulatory Visit: Payer: Medicare Other | Admitting: Physical Therapy

## 2016-12-28 DIAGNOSIS — M25611 Stiffness of right shoulder, not elsewhere classified: Secondary | ICD-10-CM

## 2016-12-28 DIAGNOSIS — M6281 Muscle weakness (generalized): Secondary | ICD-10-CM

## 2016-12-28 DIAGNOSIS — M25511 Pain in right shoulder: Secondary | ICD-10-CM | POA: Diagnosis not present

## 2016-12-28 DIAGNOSIS — G8929 Other chronic pain: Secondary | ICD-10-CM

## 2016-12-28 NOTE — Therapy (Signed)
[facility] [address] [place], [place], [address] Phone: [phone]   Fax:  [phone]  Physical Therapy Treatment  Patient Details  Name: Marilyn [name] MRN: [id number] Date of Birth: 1971/02/16 Referring Provider: Barnie Del  Encounter Date: 12/28/2016      PT End of Session - 12/28/16 1200    Visit Number 4   Number of Visits 12   Date for PT Re-Evaluation 01/23/17   PT Start Time 1030   PT Stop Time 1120   PT Time Calculation (min) 50 min   Activity Tolerance Patient tolerated treatment well   Behavior During Therapy [facility] for tasks assessed/performed      Past Medical History:  Diagnosis Date  . Anemia    taking iron now. pt states having no current issues 09/02/2015  . Anginal pain (HCC)    pt states experiences chest wall pain pt states related to her asthma   . Anxiety    with MRI's  . Arthritis   . Asthma   . Dizziness   . GERD (gastroesophageal reflux disease)   . Headache(784.0)    HX OF MIGRAINES  . History of bronchitis   . Hypertension   . Hypothyroidism    takes levothyroxen  . OSA on CPAP   . Shortness of breath    with exertion  . Wears glasses     Past Surgical History:  Procedure Laterality Date  . CESAREAN SECTION     times 2  . CHOLECYSTECTOMY    . ENDOMETRIAL ABLATION    . INCISION AND DRAINAGE HIP Left 11/10/2015   Procedure: IRRIGATION AND DEBRIDEMENT LEFT HIP INCISION;  Surgeon: Kathryne Hitch, MD;  Location: [facility] OR;  Service: Orthopedics;  Laterality: Left;  . JOINT REPLACEMENT  2011   total left knee  . KNEE ARTHROPLASTY  04/23/2012   right   . KNEE ARTHROSCOPY    . ROTATOR CUFF REPAIR     left   . SHOULDER SURGERY     right to repair ligament tear  . TOTAL HIP ARTHROPLASTY Left 09/09/2015   Procedure: LEFT TOTAL HIP ARTHROPLASTY ANTERIOR APPROACH;  Surgeon: Kathryne Hitch, MD;  Location: [facility];  Service: Orthopedics;  Laterality: Left;  . TOTAL KNEE  ARTHROPLASTY  04/23/2012   Procedure: TOTAL KNEE ARTHROPLASTY;  Surgeon: [name], MD;  Location: [facility];  Service: Orthopedics;  Laterality: Right;  Right Total Knee Arthroplasty  . TUBAL LIGATION  1996    There were no vitals filed for this visit.      Subjective Assessment - 12/28/16 1158    Subjective Pt arriving to therapy today reporting 4/10 pain.    Pertinent History Previous right shoulder surgery.   Limitations Sitting;Standing   How long can you sit comfortably? 20 minutes.   How long can you stand comfortably? 15-20 minutes.   Patient Stated Goals Use right UE without.   Currently in Pain? Yes   Pain Score 4    Pain Location Shoulder   Pain Orientation Right   Pain Descriptors / Indicators Aching   Pain Type Chronic pain   Pain Onset More than a month ago   Pain Frequency Constant   Aggravating Factors  certain movements   Pain Relieving Factors changing positions, resting   Effect of Pain on Daily Activities difficugtly reaching and performing ADL's            [facility] PT Assessment - 12/28/16 0001      Assessment  Medical Diagnosis Right shoulder Impingement.     Precautions   Precautions None     Restrictions   Weight Bearing Restrictions No     AROM   Overall AROM Comments R shoulder flexion: 138 degrees, ER: 55 degrees, IR: thumb to T12.                      [facility] Adult PT Treatment/Exercise - 12/28/16 0001      Exercises   Exercises Shoulder     Shoulder Exercises: Supine   Other Supine Exercises serratus punches x 20, shoulder stabilization 20 cricles clock wise and 20 counter clock wise with 2# weight.      Shoulder Exercises: Seated   Extension Strengthening;Right;20 reps;Theraband   Theraband Level (Shoulder Extension) Level 2 (Red)   Row Strengthening;Right;20 reps;Theraband   Theraband Level (Shoulder Row) Level 2 (Red)   External Rotation Strengthening;Right;20 reps;Theraband   Theraband Level (Shoulder External  Rotation) Level 2 (Red)   Internal Rotation Strengthening;Right;20 reps;Theraband   Theraband Level (Shoulder Internal Rotation) Level 2 (Red)   Other Seated Exercises IR with towel  x10     Shoulder Exercises: Sidelying   Other Sidelying Exercises In sidelying, allowing gravity to pull R UE into extension and IR, holding 30 seconds x 6     Shoulder Exercises: Standing   Flexion AROM;Both;20 reps   Other Standing Exercises B shoulder scaption AROM x20 reps     Shoulder Exercises: Pulleys   Flexion Limitations;Other (comment)   Flexion Limitations 4 minutes     Moist Heat Therapy   Number Minutes Moist Heat 10 Minutes   Moist Heat Location Shoulder                PT Education - 12/28/16 1159    Education provided Yes   Education Details Instructed in sidelying stretch for IR   Person(s) Educated Patient   Methods Explanation;Demonstration   Comprehension Verbalized understanding;Returned demonstration             PT Long Term Goals - 12/28/16 1202      PT LONG TERM GOAL #1   Title independent with a HEP.   Time 6   Period Weeks   Status On-going     PT LONG TERM GOAL #2   Title Active right shoulder flexion to 155 degrees so the patient can easily reach overhead   Period Weeks   Status New     PT LONG TERM GOAL #3   Title Active ER to 75 degrees+ to allow for easily donning/doffing of apparel   Time 6   Period Weeks   Status New     PT LONG TERM GOAL #4   Title Increase ROM so patient is able to reach behind back to L4 with right hand.   Time 6   Period Weeks   Status New     PT LONG TERM GOAL #5   Title Increase right  shoulder ER strength to a solid 4+/5 to increase stability for performance of functional activities   Time 6   Period Weeks   Status New     PT LONG TERM GOAL #6   Title Perform ADL's with right shoulder pain not > 3/10.   Time 6   Period Weeks   Status New               Plan - 12/28/16 1200    Clinical  Impression Statement Patient tolerated today's treatment well with concentration  on ther exercises for R shoulder. Pt reporting 4/10 pain at beginning of session and 3/10 pain at end of session. Pt with improvement in AROM of her R shoulder in ER and IR. Continue with skilled PT to progress toward goals set.    Rehab Potential Excellent   PT Frequency 2x / week   PT Duration 6 weeks   PT Treatment/Interventions ADLs/Self Care Home Management;Cryotherapy;Electrical Stimulation;Moist Heat;Ultrasound;Patient/family education;Therapeutic exercise;Therapeutic activities;Manual techniques;Passive range of motion;Dry needling;Vasopneumatic Device   PT Next Visit Plan Please perform combo e'stim/U/S to right Infraspinatus f/b STW/M including IASTM.  PAIN-FREE pulleys, UE Ranger; PROM; RW 4; scapular strengthening, full can and SDLY ER.  Other modalites and dry needling if needed.   Consulted and Agree with Plan of Care Patient      Patient will benefit from skilled therapeutic intervention in order to improve the following deficits and impairments:  Pain, Decreased activity tolerance, Decreased strength, Decreased range of motion  Visit Diagnosis: Chronic right shoulder pain  Stiffness of right shoulder, not elsewhere classified  Muscle weakness (generalized)     Problem List Patient Active Problem List   Diagnosis Date Noted  . History of rotator cuff tear 11/30/2016  . Impingement syndrome of right shoulder 11/30/2016  . Exacerbation of asthma 07/18/2016  . Hypokalemia 07/18/2016  . Hypertension 07/18/2016  . Depression with anxiety 07/18/2016  . Hypothyroidism 07/18/2016  . Hyperglycemia 07/18/2016  . Acute asthma exacerbation 07/18/2016  . Surgical wound dehiscence left hip; questionable superficial infection 11/10/2015  . Left hip postoperative wound infection 11/10/2015  . Osteoarthritis of left hip 09/09/2015  . 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 Obesity (BMI  35.0-39.9 without comorbidity) 04/28/2013    Sharmon Leyden, MPT 12/28/2016, 12:05 PM  [facility] [address] [place], [place], [address] Phone: [phone]   Fax:  [phone]  Name: Marilyn [name] MRN: [id number] Date of Birth: 08-29-1971

## 2017-01-02 ENCOUNTER — Encounter: Payer: Self-pay | Admitting: Physical Therapy

## 2017-01-02 ENCOUNTER — Ambulatory Visit: Payer: Medicare Other | Admitting: Physical Therapy

## 2017-01-02 DIAGNOSIS — G8929 Other chronic pain: Secondary | ICD-10-CM

## 2017-01-02 DIAGNOSIS — M25511 Pain in right shoulder: Principal | ICD-10-CM

## 2017-01-02 DIAGNOSIS — M25611 Stiffness of right shoulder, not elsewhere classified: Secondary | ICD-10-CM

## 2017-01-02 DIAGNOSIS — M6281 Muscle weakness (generalized): Secondary | ICD-10-CM

## 2017-01-02 NOTE — Therapy (Signed)
[facility] [address] [place], [place], [address] Phone: [phone]   Fax:  [phone]  Physical Therapy Treatment  Patient Details  Name: Marilyn [name] MRN: [id number] Date of Birth: [birth year] Referring Provider: Barnie Del  Encounter Date: 01/02/2017      PT End of Session - 01/02/17 1212    Visit Number 5   Number of Visits 12   Date for PT Re-Evaluation 01/23/17   PT Start Time 1030   PT Stop Time 1115   PT Time Calculation (min) 45 min   Activity Tolerance Patient tolerated treatment well   Behavior During Therapy [facility] for tasks assessed/performed      Past Medical History:  Diagnosis Date  . Anemia    taking iron now. pt states having no current issues 09/02/2015  . Anginal pain (HCC)    pt states experiences chest wall pain pt states related to her asthma   . Anxiety    with MRI's  . Arthritis   . Asthma   . Dizziness   . GERD (gastroesophageal reflux disease)   . Headache(784.0)    HX OF MIGRAINES  . History of bronchitis   . Hypertension   . Hypothyroidism    takes levothyroxen  . OSA on CPAP   . Shortness of breath    with exertion  . Wears glasses     Past Surgical History:  Procedure Laterality Date  . CESAREAN SECTION     times 2  . CHOLECYSTECTOMY    . ENDOMETRIAL ABLATION    . INCISION AND DRAINAGE HIP Left 11/10/2015   Procedure: IRRIGATION AND DEBRIDEMENT LEFT HIP INCISION;  Surgeon: Kathryne Hitch, MD;  [facility];  Service: Orthopedics;  Laterality: Left;  . JOINT REPLACEMENT  2011   total left knee  . KNEE ARTHROPLASTY  04/23/2012   right   . KNEE ARTHROSCOPY    . ROTATOR CUFF REPAIR     left   . SHOULDER SURGERY     right to repair ligament tear  . TOTAL HIP ARTHROPLASTY Left 09/09/2015   Procedure: LEFT TOTAL HIP ARTHROPLASTY ANTERIOR APPROACH;  Surgeon: Kathryne Hitch, MD;  [facility]: [facility];  Service: Orthopedics;  Laterality: Left;  . TOTAL KNEE  ARTHROPLASTY  04/23/2012   Procedure: TOTAL KNEE ARTHROPLASTY;  Surgeon: [name], MD;  Location[facility];  Service: Orthopedics;  Laterality: Right;  Right Total Knee Arthroplasty  . TUBAL LIGATION  1996    There were no vitals filed for this visit.      Subjective Assessment - 01/02/17 1210    Subjective Pt arriving to therapy today reporting 6/10 pain. Pt reporting a hard day at work yesterday which made her pain worse.    Pertinent History Previous right shoulder surgery.   Limitations Sitting;Standing   How long can you sit comfortably? 20 minutes.   How long can you stand comfortably? 15-20 minutes.   Patient Stated Goals Use right UE without.   Currently in Pain? Yes   Pain Score 6    Pain Location Shoulder   Pain Orientation Right   Pain Descriptors / Indicators Aching;Sore   Pain Type Chronic pain   Pain Onset More than a month ago   Pain Frequency Constant   Aggravating Factors  certain movements, being at work for long hours   Pain Relieving Factors changing positions, heat, resting   Effect of Pain on Daily Activities difficulty reaching and performing ADL's at times  [facility] PT Assessment - 01/02/17 0001      Assessment   Medical Diagnosis Right shoulder Impingement.     Precautions   Precautions None     Restrictions   Weight Bearing Restrictions No                     [facility] Adult PT Treatment/Exercise - 01/02/17 0001      Exercises   Exercises Shoulder     Shoulder Exercises: Seated   Extension Strengthening;Right;20 reps;Theraband   Theraband Level (Shoulder Extension) Level 2 (Red)   Row Strengthening;Right;20 reps;Theraband   Theraband Level (Shoulder Row) Level 2 (Red)   External Rotation Strengthening;Right;20 reps;Theraband   Theraband Level (Shoulder External Rotation) Level 2 (Red)   Internal Rotation Strengthening;Right;20 reps;Theraband   Theraband Level (Shoulder Internal Rotation) Level 2 (Red)     Shoulder  Exercises: Standing   Flexion AROM;Both;20 reps   Other Standing Exercises B shoulder scaption AROM x20 reps     Shoulder Exercises: Pulleys   Flexion Limitations 5 minutes     Modalities   Modalities Electrical Stimulation;Moist Heat     Moist Heat Therapy   Number Minutes Moist Heat 15 Minutes   Moist Heat Location Shoulder     Electrical Stimulation   Electrical Stimulation Location R shoulder upper trap   Electrical Stimulation Action IFC   Electrical Stimulation Parameters 80-150 Hz x 15 minutes, intensity to pt's tolerance   Electrical Stimulation Goals Pain     Manual Therapy   Manual Therapy Soft tissue mobilization   Soft tissue mobilization STW to R upper trap and cervical paraspinals, Pt with trigger points noted in R Upper Trap.                 PT Education - 01/02/17 1211    Education provided Yes   Education Details reviewed HEP   Person(s) Educated Patient   Methods Explanation   Comprehension Verbalized understanding             PT Long Term Goals - 01/02/17 1213      PT LONG TERM GOAL #1   Title independent with a HEP.   Period Weeks   Status Achieved     PT LONG TERM GOAL #2   Title Active right shoulder flexion to 155 degrees so the patient can easily reach overhead   Time 6   Period Weeks   Status New     PT LONG TERM GOAL #3   Title Active ER to 75 degrees+ to allow for easily donning/doffing of apparel   Period Weeks   Status New     PT LONG TERM GOAL #4   Title Increase ROM so patient is able to reach behind back to L4 with right hand.   Time 6   Period Weeks   Status New     PT LONG TERM GOAL #5   Title Increase right  shoulder ER strength to a solid 4+/5 to increase stability for performance of functional activities   Time 6   Period Weeks   Status New     PT LONG TERM GOAL #6   Title Perform ADL's with right shoulder pain not > 3/10.   Time 6   Period Weeks   Status New               Plan - 01/02/17  1212    Clinical Impression Statement patient tolerated treatment well today. Pt arriving with 6/10 pain and leaving  with 4/10 pain in her R shoulder. Reviewed HEP and STW with biofreeze and E-stim applied today. Continue with skilled PT as pt progresses toward LTG's set.    Rehab Potential Excellent   PT Frequency 2x / week   PT Duration 6 weeks   PT Treatment/Interventions ADLs/Self Care Home Management;Cryotherapy;Electrical Stimulation;Moist Heat;Ultrasound;Patient/family education;Therapeutic exercise;Therapeutic activities;Manual techniques;Passive range of motion;Dry needling;Vasopneumatic Device   PT Next Visit Plan Please perform combo e'stim/U/S to right Infraspinatus f/b STW/M including IASTM.  PAIN-FREE pulleys, UE Ranger; PROM; RW 4; scapular strengthening, full can and SDLY ER.  Other modalites and dry needling if needed.   Consulted and Agree with Plan of Care Patient      Patient will benefit from skilled therapeutic intervention in order to improve the following deficits and impairments:  Pain, Decreased activity tolerance, Decreased strength, Decreased range of motion  Visit Diagnosis: Chronic right shoulder pain  Stiffness of right shoulder, not elsewhere classified  Muscle weakness (generalized)     Problem List Patient Active Problem List   Diagnosis Date Noted  . History of rotator cuff tear 11/30/2016  . Impingement syndrome of right shoulder 11/30/2016  . Exacerbation of asthma 07/18/2016  . Hypokalemia 07/18/2016  . Hypertension 07/18/2016  . Depression with anxiety 07/18/2016  . Hypothyroidism 07/18/2016  . Hyperglycemia 07/18/2016  . Acute asthma exacerbation 07/18/2016  . Surgical wound dehiscence left hip; questionable superficial infection 11/10/2015  . Left hip postoperative wound infection 11/10/2015  . Osteoarthritis of left hip 09/09/2015  . 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 Obesity (BMI 35.0-39.9 without comorbidity)  04/28/2013    Sharmon Leyden , MPT 01/02/2017, 12:15 PM  [facility] [address] [place], [place], [address] Phone: [phone]   Fax:  [phone]  Name: Marilyn [name] MRN: [id number] Date of Birth: [birth year]

## 2017-01-04 ENCOUNTER — Encounter: Payer: Medicare Other | Admitting: Physical Therapy

## 2017-01-04 ENCOUNTER — Ambulatory Visit (INDEPENDENT_AMBULATORY_CARE_PROVIDER_SITE_OTHER): Payer: Medicare Other | Admitting: Orthopedic Surgery

## 2017-01-04 DIAGNOSIS — M7541 Impingement syndrome of right shoulder: Secondary | ICD-10-CM | POA: Diagnosis not present

## 2017-01-04 NOTE — Progress Notes (Signed)
Office Visit Note   Patient: Marilyn [name]           Date of Birth: Dec 26, 1970           MRN: [id number] Visit Date: 01/04/2017              Requested by: Joette Catching, MD [address] [place], [place] [address] PCP: Josue Hector, MD  Chief Complaint  Patient presents with  . Right Shoulder - Pain      HPI: Patient is a 46 year old woman with impingement and adhesive capsulitis of both shoulders she is currently doing physical therapy at [facility] outpatient and she states she has not returned to the gym yet. She is working primarily on her right shoulder. She states the pain is worse at the end of the day does not have much pain generated today she states she still has decreased range of motion she has had no relief with subacromial injections. Of note patient states she is currently going to pain management and she is on morphine and oxycodone 15 mg.  Assessment & Plan: Visit Diagnoses:  1. Impingement syndrome of right shoulder     Plan: Recommended continue with her physical therapy continue with scapular stabilization return to the gym for overall conditioning. Follow-up as needed. Recommended focusing on scapular stabilization.  Follow-Up Instructions: Return if symptoms worsen or fail to improve.   Ortho Exam  Patient is alert, oriented, no adenopathy, well-dressed, normal affect, normal respiratory effort. Examination patient does have an antalgic gait. Examination of both shoulders she has active abduction and flexion 220. She has significant decreased internal and external rotation of only about 30 bilaterally. Patient does have pain with Neer and Hawkins impingement test.  Imaging: No results found.  Labs: Lab Results  Component Value Date   HGBA1C 6.2 (H) 07/18/2016   HGBA1C 6.2 (H) 09/02/2015   ESRSEDRATE 122 (H) 11/11/2015   CRP 11.2 (H) 11/11/2015   REPTSTATUS 10/02/2015 FINAL 09/29/2015   CULT  09/29/2015    NO GROUP A STREP (S.PYOGENES)  ISOLATED Performed at [facility]    [facility] ESCHERICHIA COLI 07/18/2012    Orders:  No orders of the defined types were placed in this encounter.  No orders of the defined types were placed in this encounter.    Procedures: No procedures performed  Clinical Data: No additional findings.  ROS:  All other systems negative, except as noted in the HPI. Review of Systems  Objective: Vital Signs: There were no vitals taken for this visit.  Specialty Comments:  No specialty comments available.  PMFS History: Patient Active Problem List   Diagnosis Date Noted  . History of rotator cuff tear 11/30/2016  . Impingement syndrome of right shoulder 11/30/2016  . Exacerbation of asthma 07/18/2016  . Hypokalemia 07/18/2016  . Hypertension 07/18/2016  . Depression with anxiety 07/18/2016  . Hypothyroidism 07/18/2016  . Hyperglycemia 07/18/2016  . Acute asthma exacerbation 07/18/2016  . Surgical wound dehiscence left hip; questionable superficial infection 11/10/2015  . Left hip postoperative wound infection 11/10/2015  . Osteoarthritis of left hip 09/09/2015  . 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 Obesity (BMI 35.0-39.9 without comorbidity) 04/28/2013   Past Medical History:  Diagnosis Date  . Anemia    taking iron now. pt states having no current issues 09/02/2015  . Anginal pain (HCC)    pt states experiences chest wall pain pt states related to her asthma   . Anxiety    with  MRI's  . Arthritis   . Asthma   . Dizziness   . GERD (gastroesophageal reflux disease)   . Headache(784.0)    HX OF MIGRAINES  . History of bronchitis   . Hypertension   . Hypothyroidism    takes levothyroxen  . OSA on CPAP   . Shortness of breath    with exertion  . Wears glasses     Family History  Problem Relation Age of Onset  . Cancer Mother     colon  . Epilepsy Mother   . Cancer Father     prostate  . Diabetes Father   . Hypertension Father   .  Hypertension Maternal Aunt   . Diabetes Maternal Aunt   . Hypertension Paternal Aunt     Past Surgical History:  Procedure Laterality Date  . CESAREAN SECTION     times 2  . CHOLECYSTECTOMY    . ENDOMETRIAL ABLATION    . INCISION AND DRAINAGE HIP Left 11/10/2015   Procedure: IRRIGATION AND DEBRIDEMENT LEFT HIP INCISION;  Surgeon: Kathryne Hitch, MD;  [facility];  Service: Orthopedics;  Laterality: Left;  . JOINT REPLACEMENT  2011   total left knee  . KNEE ARTHROPLASTY  04/23/2012   right   . KNEE ARTHROSCOPY    . ROTATOR CUFF REPAIR     left   . SHOULDER SURGERY     right to repair ligament tear  . TOTAL HIP ARTHROPLASTY Left 09/09/2015   Procedure: LEFT TOTAL HIP ARTHROPLASTY ANTERIOR APPROACH;  Surgeon: Kathryne Hitch, MD;  [facility];  Service: Orthopedics;  Laterality: Left;  . TOTAL KNEE ARTHROPLASTY  04/23/2012   Procedure: TOTAL KNEE ARTHROPLASTY;  Surgeon: [name], MD;  Location[facility];  Service: Orthopedics;  Laterality: Right;  Right Total Knee Arthroplasty  . TUBAL LIGATION  1996   Social History   Occupational History  . Not on file.   Social History Main Topics  . Smoking status: Former Smoker    Packs/day: 0.50    Years: 4.00    Quit date: 09/18/1991  . Smokeless tobacco: Never Used  . Alcohol use No  . Drug use: No  . Sexual activity: Yes    Birth control/ protection: Surgical

## 2017-01-07 ENCOUNTER — Ambulatory Visit (INDEPENDENT_AMBULATORY_CARE_PROVIDER_SITE_OTHER): Payer: Medicare Other | Admitting: Orthopaedic Surgery

## 2017-01-07 ENCOUNTER — Ambulatory Visit (INDEPENDENT_AMBULATORY_CARE_PROVIDER_SITE_OTHER): Payer: Medicare Other

## 2017-01-07 DIAGNOSIS — Z96642 Presence of left artificial hip joint: Secondary | ICD-10-CM

## 2017-01-07 NOTE — Progress Notes (Signed)
The patient is now over 2 years out from a left hip replacement. She did have a postoperative, patient of the wound dehiscence and superficial infection but it never became deep. She's had bilateral knee replacements in the past as well. She is someone who is morbidly obese and very active. Recently she was doing a lot of activities and felt a pulling sensation the muscle from the hip this was last week. She will also make sure that hip is doing well. She says she feels okay now.  On examination she has fluid range of motion of her left hip. His no issues at all with the skin or soft tissues. An AP pelvis lateral of her left hip show no evidence of osteolysis or loosening. There is no periosteal reaction tissue just residual infection.  At this point she'll follow-up as needed. She understands she can have a occasional aches and pains and given her weight problems certainly these things can be an issue I told her if any time there is any issues at all that we need to cc a call if we can see her.

## 2017-01-08 ENCOUNTER — Encounter: Payer: Medicare Other | Admitting: *Deleted

## 2017-01-09 ENCOUNTER — Encounter: Payer: Self-pay | Admitting: Physical Therapy

## 2017-01-09 ENCOUNTER — Ambulatory Visit: Payer: Medicare Other | Admitting: Physical Therapy

## 2017-01-09 DIAGNOSIS — M25511 Pain in right shoulder: Secondary | ICD-10-CM | POA: Diagnosis not present

## 2017-01-09 DIAGNOSIS — M6281 Muscle weakness (generalized): Secondary | ICD-10-CM

## 2017-01-09 DIAGNOSIS — M25611 Stiffness of right shoulder, not elsewhere classified: Secondary | ICD-10-CM

## 2017-01-09 DIAGNOSIS — G8929 Other chronic pain: Secondary | ICD-10-CM

## 2017-01-09 NOTE — Therapy (Signed)
[facility] [place], [name], [address] Phone: [phone]   Fax:  [phone]  Physical Therapy Treatment  Patient Details  Name: KEMIYA BATDORF MRN: [id number] Date of Birth: [birth year] Referring Provider: Dondra Prader  Encounter Date: 01/09/2017      PT End of Session - 01/09/17 1458    Visit Number 6   Number of Visits 12   Date for PT Re-Evaluation 01/23/17   PT Start Time 9480   PT Stop Time 1521   PT Time Calculation (min) 48 min   Activity Tolerance Patient tolerated treatment well   Behavior During Therapy [facility] for tasks assessed/performed      Past Medical History:  Diagnosis Date  . Anemia    taking iron now. pt states having no current issues 09/02/2015  . Anginal pain ([place])    pt states experiences chest wall pain pt states related to her asthma   . Anxiety    with MRI's  . Arthritis   . Asthma   . Dizziness   . GERD (gastroesophageal reflux disease)   . Headache(784.0)    HX OF MIGRAINES  . History of bronchitis   . Hypertension   . Hypothyroidism    takes levothyroxen  . OSA on CPAP   . Shortness of breath    with exertion  . Wears glasses     Past Surgical History:  Procedure Laterality Date  . CESAREAN SECTION     times 2  . CHOLECYSTECTOMY    . ENDOMETRIAL ABLATION    . INCISION AND DRAINAGE HIP Left 11/10/2015   Procedure: IRRIGATION AND DEBRIDEMENT LEFT HIP INCISION;  Surgeon: Mcarthur Rossetti, MD;  Location: [facility];  Service: Orthopedics;  Laterality: Left;  . JOINT REPLACEMENT  2011   total left knee  . KNEE ARTHROPLASTY  04/23/2012   right   . KNEE ARTHROSCOPY    . ROTATOR CUFF REPAIR     left   . SHOULDER SURGERY     right to repair ligament tear  . TOTAL HIP ARTHROPLASTY Left 09/09/2015   Procedure: LEFT TOTAL HIP ARTHROPLASTY ANTERIOR APPROACH;  Surgeon: Mcarthur Rossetti, MD;  [facility];  Service: Orthopedics;  Laterality: Left;  . TOTAL KNEE  ARTHROPLASTY  04/23/2012   Procedure: TOTAL KNEE ARTHROPLASTY;  Surgeon: Newt Minion, MD;  Location: [facility];  Service: Orthopedics;  Laterality: Right;  Right Total Knee Arthroplasty  . TUBAL LIGATION  1996    There were no vitals filed for this visit.      Subjective Assessment - 01/09/17 1434    Subjective Reports that early in the morning is usually the worst time. Still has good days and bad days. Thinks recent rainy weather may be affecting her shoulder as well.   Pertinent History Previous right shoulder surgery.   Limitations Sitting;Standing   How long can you sit comfortably? 20 minutes.   How long can you stand comfortably? 15-20 minutes.   Patient Stated Goals Use right UE without.   Currently in Pain? Yes   Pain Score 5    Pain Location Shoulder   Pain Orientation Right   Pain Descriptors / Indicators Aching;Sore   Pain Type Chronic pain   Pain Onset More than a month ago            [facility] PT Assessment - 01/09/17 0001      Assessment   Medical Diagnosis Right shoulder Impingement.     Precautions  Precautions None     Restrictions   Weight Bearing Restrictions No     ROM / Strength   AROM / PROM / Strength AROM     AROM   Overall AROM  Deficits   AROM Assessment Site Shoulder   Right/Left Shoulder Right   Right Shoulder Flexion 138 Degrees   Right Shoulder External Rotation 50 Degrees                     [facility] Adult PT Treatment/Exercise - 01/09/17 0001      Shoulder Exercises: Seated   Other Seated Exercises Limited to 2 reps of towel stretch for IR due to pain     Shoulder Exercises: Standing   Horizontal ABduction Strengthening;Both;20 reps;Theraband   Theraband Level (Shoulder Horizontal ABduction) Level 2 (Red)   External Rotation Strengthening;Right;20 reps;Theraband   Theraband Level (Shoulder External Rotation) Level 2 (Red)   Internal Rotation Strengthening;Right;20 reps;Theraband   Theraband Level (Shoulder Internal  Rotation) Level 2 (Red)   Flexion AROM;Both;20 reps   Extension Strengthening;Right;20 reps;Theraband   Theraband Level (Shoulder Extension) Level 2 (Red)   Row Strengthening;Right;20 reps;Theraband   Theraband Level (Shoulder Row) Level 2 (Red)   Other Standing Exercises B shoulder scaption AROM x20 reps   Other Standing Exercises R wall slides x10 reps     Shoulder Exercises: Pulleys   Flexion Other (comment)  x5 min   Other Pulley Exercises Seated UE ranger flex/circles x20 reps each     Modalities   Modalities Electrical Stimulation;Moist Heat     Moist Heat Therapy   Number Minutes Moist Heat 15 Minutes   Moist Heat Location Shoulder     Electrical Stimulation   Electrical Stimulation Location R shoulder   Electrical Stimulation Action IFC   Electrical Stimulation Parameters 1-10 hz x15 min   Electrical Stimulation Goals Pain                     PT Long Term Goals - 01/09/17 1500      PT LONG TERM GOAL #1   Title independent with a HEP.   Period Weeks   Status Achieved     PT LONG TERM GOAL #2   Title Active right shoulder flexion to 155 degrees so the patient can easily reach overhead   Time 6   Period Weeks   Status On-going  AROM 138 deg R shoulder flexion 01/09/2017     PT LONG TERM GOAL #3   Title Active ER to 75 degrees+ to allow for easily donning/doffing of apparel   Period Weeks   Status On-going  AROM R shoulder ER 55 deg 01/09/2017     PT LONG TERM GOAL #4   Title Increase ROM so patient is able to reach behind back to L4 with right hand.   Time 6   Period Weeks   Status On-going     PT LONG TERM GOAL #5   Title Increase right  shoulder ER strength to a solid 4+/5 to increase stability for performance of functional activities   Time 6   Period Weeks   Status On-going     PT LONG TERM GOAL #6   Title Perform ADL's with right shoulder pain not > 3/10.   Time 6   Period Weeks   Status Partially Met               Plan  - 01/09/17 1514    Clinical Impression Statement Patient able  to complete exercises fairly well although towel stretch into IR was especially painful today for patient. Patient able to complete resisted theraband strengthening with red therband without complaint of pain. End range pain experienced by patient with ER today as well as AROM flexion in sitting. Normal modalities response noted following removal of the modalities. Low level soreness in R shoulder experienced by patient following today's treatment per patient report.   Rehab Potential Excellent   PT Frequency 2x / week   PT Duration 6 weeks   PT Treatment/Interventions ADLs/Self Care Home Management;Cryotherapy;Electrical Stimulation;Moist Heat;Ultrasound;Patient/family education;Therapeutic exercise;Therapeutic activities;Manual techniques;Passive range of motion;Dry needling;Vasopneumatic Device   PT Next Visit Plan Please perform combo e'stim/U/S to right Infraspinatus f/b STW/M including IASTM.  PAIN-FREE pulleys, UE Ranger; PROM; RW 4; scapular strengthening, full can and SDLY ER.  Other modalites and dry needling if needed.   Consulted and Agree with Plan of Care Patient      Patient will benefit from skilled therapeutic intervention in order to improve the following deficits and impairments:  Pain, Decreased activity tolerance, Decreased strength, Decreased range of motion  Visit Diagnosis: Chronic right shoulder pain  Stiffness of right shoulder, not elsewhere classified  Muscle weakness (generalized)     Problem List Patient Active Problem List   Diagnosis Date Noted  . History of rotator cuff tear 11/30/2016  . Impingement syndrome of right shoulder 11/30/2016  . Exacerbation of asthma 07/18/2016  . Hypokalemia 07/18/2016  . Hypertension 07/18/2016  . Depression with anxiety 07/18/2016  . Hypothyroidism 07/18/2016  . Hyperglycemia 07/18/2016  . Acute asthma exacerbation 07/18/2016  . Surgical wound dehiscence  left hip; questionable superficial infection 11/10/2015  . Left hip postoperative wound infection 11/10/2015  . Osteoarthritis of left hip 09/09/2015  . 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 Obesity (BMI 35.0-39.9 without comorbidity) 04/28/2013    Wynelle Fanny, PTA 01/09/2017, 3:33 PM  [facility] [address] [place], [name], [address] Phone: [phone]   Fax:  [phone]  Name: ITZELLE GAINS MRN: [id number] Date of Birth: 1970/11/02

## 2017-01-10 ENCOUNTER — Ambulatory Visit: Payer: Medicare Other | Admitting: *Deleted

## 2017-01-10 DIAGNOSIS — M6281 Muscle weakness (generalized): Secondary | ICD-10-CM

## 2017-01-10 DIAGNOSIS — G8929 Other chronic pain: Secondary | ICD-10-CM

## 2017-01-10 DIAGNOSIS — M25511 Pain in right shoulder: Principal | ICD-10-CM

## 2017-01-10 DIAGNOSIS — M25611 Stiffness of right shoulder, not elsewhere classified: Secondary | ICD-10-CM

## 2017-01-10 NOTE — Therapy (Signed)
[facility] [place], [name], [address] Phone: [phone]   Fax:  [phone]  Physical Therapy Treatment  Patient Details  Name: Marilyn [name] MRN: [id number] Date of Birth: [birth year] Referring Provider: Dondra Prader  Encounter Date: 01/10/2017      PT End of Session - 01/10/17 1311    Visit Number 7   Number of Visits 12   Date for PT Re-Evaluation 01/23/17   PT Start Time 1300   PT Stop Time 8325   PT Time Calculation (min) 52 min      Past Medical History:  Diagnosis Date  . Anemia    taking iron now. pt states having no current issues 09/02/2015  . Anginal pain ([place])    pt states experiences chest wall pain pt states related to her asthma   . Anxiety    with MRI's  . Arthritis   . Asthma   . Dizziness   . GERD (gastroesophageal reflux disease)   . Headache(784.0)    HX OF MIGRAINES  . History of bronchitis   . Hypertension   . Hypothyroidism    takes levothyroxen  . OSA on CPAP   . Shortness of breath    with exertion  . Wears glasses     Past Surgical History:  Procedure Laterality Date  . CESAREAN SECTION     times 2  . CHOLECYSTECTOMY    . ENDOMETRIAL ABLATION    . INCISION AND DRAINAGE HIP Left 11/10/2015   Procedure: IRRIGATION AND DEBRIDEMENT LEFT HIP INCISION;  Surgeon: Mcarthur Rossetti, MD;  Location: [place];  Service: Orthopedics;  Laterality: Left;  . JOINT REPLACEMENT  2011   total left knee  . KNEE ARTHROPLASTY  04/23/2012   right   . KNEE ARTHROSCOPY    . ROTATOR CUFF REPAIR     left   . SHOULDER SURGERY     right to repair ligament tear  . TOTAL HIP ARTHROPLASTY Left 09/09/2015   Procedure: LEFT TOTAL HIP ARTHROPLASTY ANTERIOR APPROACH;  Surgeon: Mcarthur Rossetti, MD;  Location: [facility];  Service: Orthopedics;  Laterality: Left;  . TOTAL KNEE ARTHROPLASTY  04/23/2012   Procedure: TOTAL KNEE ARTHROPLASTY;  Surgeon: Newt Minion, MD;  Location: [place];  Service:  Orthopedics;  Laterality: Right;  Right Total Knee Arthroplasty  . TUBAL LIGATION  1996    There were no vitals filed for this visit.      Subjective Assessment - 01/10/17 1310    Subjective Reports that early in the morning is usually the worst time. Still has good days and bad days. Thinks recent rainy weather may be affecting her shoulder as well.   Pertinent History Previous right shoulder surgery.   Limitations Sitting;Standing   How long can you sit comfortably? 20 minutes.   How long can you stand comfortably? 15-20 minutes.   Patient Stated Goals Use right UE without.   Currently in Pain? Yes   Pain Score 4    Pain Location Shoulder   Pain Orientation Right   Pain Descriptors / Indicators Aching;Sore                         [facility] Adult PT Treatment/Exercise - 01/10/17 0001      Exercises   Exercises Shoulder     Shoulder Exercises: Standing   Horizontal ABduction Strengthening;Both;20 reps;Theraband   Theraband Level (Shoulder Horizontal ABduction) Level 2 (Red)   External Rotation Strengthening;Right;20  reps;Theraband   Theraband Level (Shoulder External Rotation) Level 2 (Red)   Internal Rotation Strengthening;Right;20 reps;Theraband   Theraband Level (Shoulder Internal Rotation) Level 2 (Red)   Extension Strengthening;Right;20 reps;Theraband   Row Strengthening;Right;20 reps;Theraband     Shoulder Exercises: Pulleys   Flexion Other (comment)  x5 min   Other Pulley Exercises Seated UE ranger flex/circles x20 reps each     Modalities   Modalities Electrical Stimulation;Moist Heat     Moist Heat Therapy   Number Minutes Moist Heat 15 Minutes   Moist Heat Location Shoulder     Electrical Stimulation   Electrical Stimulation Location R shoulder  IFC 1-'10hz'  x 15 mins   Electrical Stimulation Goals Pain     Ultrasound   Ultrasound Location RT shldr lateral aspect   Ultrasound Parameters  Combo 1.5 w/cm2 x 10 mins   Ultrasound Goals Pain                      PT Long Term Goals - 01/09/17 1500      PT LONG TERM GOAL #1   Title independent with a HEP.   Period Weeks   Status Achieved     PT LONG TERM GOAL #2   Title Active right shoulder flexion to 155 degrees so the patient can easily reach overhead   Time 6   Period Weeks   Status On-going  AROM 138 deg R shoulder flexion 01/09/2017     PT LONG TERM GOAL #3   Title Active ER to 75 degrees+ to allow for easily donning/doffing of apparel   Period Weeks   Status On-going  AROM R shoulder ER 55 deg 01/09/2017     PT LONG TERM GOAL #4   Title Increase ROM so patient is able to reach behind back to L4 with right hand.   Time 6   Period Weeks   Status On-going     PT LONG TERM GOAL #5   Title Increase right  shoulder ER strength to a solid 4+/5 to increase stability for performance of functional activities   Time 6   Period Weeks   Status On-going     PT LONG TERM GOAL #6   Title Perform ADL's with right shoulder pain not > 3/10.   Time 6   Period Weeks   Status Partially Met               Plan - 01/10/17 1312    Clinical Impression Statement Pt arrived today doing a little better with less RT shldr pain 4/10. She was able to complete therex with minimal increase in pain. Her CC was pain in the mornings and at night, but feels she is atleast 30% better since starting PT. Normal modality response today. LTGs are ongoing   Rehab Potential Excellent   PT Frequency 2x / week   PT Duration 6 weeks   PT Next Visit Plan Please perform combo e'stim/U/S to right Infraspinatus f/b STW/M including IASTM.  PAIN-FREE pulleys, UE Ranger; PROM; RW 4; scapular strengthening, full can and SDLY ER.  Other modalites and dry needling if needed.   Consulted and Agree with Plan of Care Patient      Patient will benefit from skilled therapeutic intervention in order to improve the following deficits and impairments:  Pain, Decreased activity tolerance, Decreased  strength, Decreased range of motion  Visit Diagnosis: Chronic right shoulder pain  Stiffness of right shoulder, not elsewhere classified  Muscle weakness (generalized)  Problem List Patient Active Problem List   Diagnosis Date Noted  . History of rotator cuff tear 11/30/2016  . Impingement syndrome of right shoulder 11/30/2016  . Exacerbation of asthma 07/18/2016  . Hypokalemia 07/18/2016  . Hypertension 07/18/2016  . Depression with anxiety 07/18/2016  . Hypothyroidism 07/18/2016  . Hyperglycemia 07/18/2016  . Acute asthma exacerbation 07/18/2016  . Surgical wound dehiscence left hip; questionable superficial infection 11/10/2015  . Left hip postoperative wound infection 11/10/2015  . Osteoarthritis of left hip 09/09/2015  . 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 Obesity (BMI 35.0-39.9 without comorbidity) 04/28/2013    Frankye Schwegel,CHRIS , PTA 01/10/2017, 3:38 PM  [facility] [place], [name], [address] Phone: [phone]   Fax:  [phone]  Name: ARES TEGTMEYER MRN: [id number] Date of Birth: 1971-08-26

## 2017-01-15 ENCOUNTER — Ambulatory Visit: Payer: Medicare Other | Attending: Family | Admitting: Physical Therapy

## 2017-01-15 ENCOUNTER — Encounter: Payer: Self-pay | Admitting: Physical Therapy

## 2017-01-15 DIAGNOSIS — M25611 Stiffness of right shoulder, not elsewhere classified: Secondary | ICD-10-CM | POA: Insufficient documentation

## 2017-01-15 DIAGNOSIS — M25511 Pain in right shoulder: Secondary | ICD-10-CM | POA: Diagnosis present

## 2017-01-15 DIAGNOSIS — M6281 Muscle weakness (generalized): Secondary | ICD-10-CM | POA: Diagnosis present

## 2017-01-15 DIAGNOSIS — G8929 Other chronic pain: Secondary | ICD-10-CM | POA: Diagnosis present

## 2017-01-15 NOTE — Therapy (Signed)
[facility] [place], [name], [address] Phone: [phone]   Fax:  [phone]  Physical Therapy Treatment  Patient Details  Name: Marilyn [name] MRN: [id number] Date of Birth: [birth year] Referring Provider: Dondra Prader  Encounter Date: 01/15/2017      PT End of Session - 01/15/17 1119    Visit Number 8   Number of Visits 12   Date for PT Re-Evaluation 01/23/17   PT Start Time 1115   PT Stop Time 1203   PT Time Calculation (min) 48 min   Activity Tolerance Patient tolerated treatment well   Behavior During Therapy [facility] for tasks assessed/performed      Past Medical History:  Diagnosis Date  . Anemia    taking iron now. pt states having no current issues 09/02/2015  . Anginal pain ([place])    pt states experiences chest wall pain pt states related to her asthma   . Anxiety    with MRI's  . Arthritis   . Asthma   . Dizziness   . GERD (gastroesophageal reflux disease)   . Headache(784.0)    HX OF MIGRAINES  . History of bronchitis   . Hypertension   . Hypothyroidism    takes levothyroxen  . OSA on CPAP   . Shortness of breath    with exertion  . Wears glasses     Past Surgical History:  Procedure Laterality Date  . CESAREAN SECTION     times 2  . CHOLECYSTECTOMY    . ENDOMETRIAL ABLATION    . INCISION AND DRAINAGE HIP Left 11/10/2015   Procedure: IRRIGATION AND DEBRIDEMENT LEFT HIP INCISION;  Surgeon: Mcarthur Rossetti, MD;  Location: [place];  Service: Orthopedics;  Laterality: Left;  . JOINT REPLACEMENT  2011   total left knee  . KNEE ARTHROPLASTY  04/23/2012   right   . KNEE ARTHROSCOPY    . ROTATOR CUFF REPAIR     left   . SHOULDER SURGERY     right to repair ligament tear  . TOTAL HIP ARTHROPLASTY Left 09/09/2015   Procedure: LEFT TOTAL HIP ARTHROPLASTY ANTERIOR APPROACH;  Surgeon: Mcarthur Rossetti, MD;  Location: [facility];  Service: Orthopedics;  Laterality: Left;  . TOTAL KNEE  ARTHROPLASTY  04/23/2012   Procedure: TOTAL KNEE ARTHROPLASTY;  Surgeon: Newt Minion, MD;  Location: [place];  Service: Orthopedics;  Laterality: Right;  Right Total Knee Arthroplasty  . TUBAL LIGATION  1996    There were no vitals filed for this visit.      Subjective Assessment - 01/15/17 1118    Subjective Reports that earlier this morning she experienced 7/10 sore and sharp pain in shoulder but mornings are always worse. Reports that she has been taking muscle relaxers at night and still sometimes does not get good sleep.   Pertinent History Previous right shoulder surgery.   Limitations Sitting;Standing   How long can you sit comfortably? 20 minutes.   How long can you stand comfortably? 15-20 minutes.   Patient Stated Goals Use right UE without.   Currently in Pain? Yes   Pain Score 6    Pain Location Shoulder   Pain Orientation Right   Pain Descriptors / Indicators Sore   Pain Type Chronic pain   Pain Onset More than a month ago            [facility] PT Assessment - 01/15/17 0001      Assessment   Medical Diagnosis Right shoulder  Impingement.     Precautions   Precautions None     Restrictions   Weight Bearing Restrictions No                     [facility] Adult PT Treatment/Exercise - 01/15/17 0001      Shoulder Exercises: Standing   Protraction Strengthening;Right;20 reps;Theraband   Theraband Level (Shoulder Protraction) Level 2 (Red)   External Rotation Strengthening;Right;Theraband  3x10 reps   Theraband Level (Shoulder External Rotation) Level 2 (Red)   Internal Rotation Strengthening;Right;Theraband   Theraband Level (Shoulder Internal Rotation) Level 2 (Red)   Internal Rotation Limitations 3x10 reps   Flexion Strengthening;Both;20 reps;Weights   Shoulder Flexion Weight (lbs) 1   Extension Strengthening;Right;Theraband   Theraband Level (Shoulder Extension) Level 2 (Red)   Extension Limitations 3x10 reps   Row Strengthening;Right;Theraband    Theraband Level (Shoulder Row) Level 2 (Red)   Row Limitations 3x10 reps   Other Standing Exercises B shoulder scaption 1# 2x10 reps     Shoulder Exercises: Pulleys   Flexion Other (comment)  x5 min   Other Pulley Exercises Seated UE ranger flex/circles x20 reps each     Modalities   Modalities Electrical Stimulation;Moist Heat;Ultrasound     Moist Heat Therapy   Number Minutes Moist Heat 15 Minutes   Moist Heat Location Shoulder     Electrical Stimulation   Electrical Stimulation Location R shoulder   Electrical Stimulation Action IFC   Electrical Stimulation Parameters 1-10 hz x15 min   Electrical Stimulation Goals Pain     Ultrasound   Ultrasound Location R posteriolateral shoulder   Ultrasound Parameters 1.5 w/cm2, 100%,1 mhz x10 min   Ultrasound Goals Pain                     PT Long Term Goals - 01/09/17 1500      PT LONG TERM GOAL #1   Title independent with a HEP.   Period Weeks   Status Achieved     PT LONG TERM GOAL #2   Title Active right shoulder flexion to 155 degrees so the patient can easily reach overhead   Time 6   Period Weeks   Status On-going  AROM 138 deg R shoulder flexion 01/09/2017     PT LONG TERM GOAL #3   Title Active ER to 75 degrees+ to allow for easily donning/doffing of apparel   Period Weeks   Status On-going  AROM R shoulder ER 55 deg 01/09/2017     PT LONG TERM GOAL #4   Title Increase ROM so patient is able to reach behind back to L4 with right hand.   Time 6   Period Weeks   Status On-going     PT LONG TERM GOAL #5   Title Increase right  shoulder ER strength to a solid 4+/5 to increase stability for performance of functional activities   Time 6   Period Weeks   Status On-going     PT LONG TERM GOAL #6   Title Perform ADL's with right shoulder pain not > 3/10.   Time 6   Period Weeks   Status Partially Met               Plan - 01/15/17 1155    Clinical Impression Statement Patient arrived to  clinic with 6/10 current R shoulder pain that earlier this morning was 7/10. Tone noted inferior to R acromian process region with tenderness experienced by patient in  R deltoids musculature. Patient guided through R shoulder strengthening with no complaints other than minimal increased pain with B shoulder scaption with 1#. Discomfort also noted with R shoulder protraction with red theraband per patient facial grimacing. Normal modalities response noted following removal of the modalities.   Rehab Potential Excellent   PT Frequency 2x / week   PT Duration 6 weeks   PT Treatment/Interventions ADLs/Self Care Home Management;Cryotherapy;Electrical Stimulation;Moist Heat;Ultrasound;Patient/family education;Therapeutic exercise;Therapeutic activities;Manual techniques;Passive range of motion;Dry needling;Vasopneumatic Device   PT Next Visit Plan Continue painfree R shoulder strengthening with modalities PRN per MPT POC.   Consulted and Agree with Plan of Care Patient      Patient will benefit from skilled therapeutic intervention in order to improve the following deficits and impairments:  Pain, Decreased activity tolerance, Decreased strength, Decreased range of motion  Visit Diagnosis: Chronic right shoulder pain  Stiffness of right shoulder, not elsewhere classified  Muscle weakness (generalized)     Problem List Patient Active Problem List   Diagnosis Date Noted  . History of rotator cuff tear 11/30/2016  . Impingement syndrome of right shoulder 11/30/2016  . Exacerbation of asthma 07/18/2016  . Hypokalemia 07/18/2016  . Hypertension 07/18/2016  . Depression with anxiety 07/18/2016  . Hypothyroidism 07/18/2016  . Hyperglycemia 07/18/2016  . Acute asthma exacerbation 07/18/2016  . Surgical wound dehiscence left hip; questionable superficial infection 11/10/2015  . Left hip postoperative wound infection 11/10/2015  . Osteoarthritis of left hip 09/09/2015  . Status post total  replacement of left hip 09/09/2015  . Obesity (BMI 35.0-39.9 without comorbidity) 04/28/2013    Wynelle Fanny, PTA 01/15/2017, 12:08 PM  [facility] [place], [name], [address] Phone: [phone]   Fax:  [phone]  Name: ENRIKA AGUADO MRN: [id number] Date of Birth: 07-30-1971

## 2017-01-17 ENCOUNTER — Encounter: Payer: Self-pay | Admitting: Physical Therapy

## 2017-01-17 ENCOUNTER — Ambulatory Visit: Payer: Medicare Other | Admitting: Physical Therapy

## 2017-01-17 DIAGNOSIS — M25511 Pain in right shoulder: Secondary | ICD-10-CM | POA: Diagnosis not present

## 2017-01-17 DIAGNOSIS — M6281 Muscle weakness (generalized): Secondary | ICD-10-CM

## 2017-01-17 DIAGNOSIS — G8929 Other chronic pain: Secondary | ICD-10-CM

## 2017-01-17 DIAGNOSIS — M25611 Stiffness of right shoulder, not elsewhere classified: Secondary | ICD-10-CM

## 2017-01-17 NOTE — Therapy (Signed)
[facility] [place], [name], [address] Phone: [phone]   Fax:  [phone]  Physical Therapy Treatment  Patient Details  Name: Marilyn [name] MRN: [id number] Date of Birth: [birth year] Referring Provider: Dondra Prader  Encounter Date: 01/17/2017      PT End of Session - 01/17/17 1128    Visit Number 9   Number of Visits 12   Date for PT Re-Evaluation 01/23/17   PT Start Time 1127  2 units secondary to late arrival   PT Stop Time 1207   PT Time Calculation (min) 40 min   Activity Tolerance Patient tolerated treatment well   Behavior During Therapy [facility] for tasks assessed/performed      Past Medical History:  Diagnosis Date  . Anemia    taking iron now. pt states having no current issues 09/02/2015  . Anginal pain ([place])    pt states experiences chest wall pain pt states related to her asthma   . Anxiety    with MRI's  . Arthritis   . Asthma   . Dizziness   . GERD (gastroesophageal reflux disease)   . Headache(784.0)    HX OF MIGRAINES  . History of bronchitis   . Hypertension   . Hypothyroidism    takes levothyroxen  . OSA on CPAP   . Shortness of breath    with exertion  . Wears glasses     Past Surgical History:  Procedure Laterality Date  . CESAREAN SECTION     times 2  . CHOLECYSTECTOMY    . ENDOMETRIAL ABLATION    . INCISION AND DRAINAGE HIP Left 11/10/2015   Procedure: IRRIGATION AND DEBRIDEMENT LEFT HIP INCISION;  Surgeon: Mcarthur Rossetti, MD;  Location: [place];  Service: Orthopedics;  Laterality: Left;  . JOINT REPLACEMENT  2011   total left knee  . KNEE ARTHROPLASTY  04/23/2012   right   . KNEE ARTHROSCOPY    . ROTATOR CUFF REPAIR     left   . SHOULDER SURGERY     right to repair ligament tear  . TOTAL HIP ARTHROPLASTY Left 09/09/2015   Procedure: LEFT TOTAL HIP ARTHROPLASTY ANTERIOR APPROACH;  Surgeon: Mcarthur Rossetti, MD;  Location[facility];  Service: Orthopedics;   Laterality: Left;  . TOTAL KNEE ARTHROPLASTY  04/23/2012   Procedure: TOTAL KNEE ARTHROPLASTY;  Surgeon: Newt Minion, MD;  Location: [place];  Service: Orthopedics;  Laterality: Right;  Right Total Knee Arthroplasty  . TUBAL LIGATION  1996    There were no vitals filed for this visit.      Subjective Assessment - 01/17/17 1127    Subjective Reports that she has "a little" shoulder pain this morning and "so far so good."   Pertinent History Previous right shoulder surgery.   Limitations Sitting;Standing   How long can you sit comfortably? 20 minutes.   How long can you stand comfortably? 15-20 minutes.   Patient Stated Goals Use right UE without.   Currently in Pain? Yes   Pain Score 4    Pain Location Shoulder   Pain Orientation Right   Pain Descriptors / Indicators Aching   Pain Type Chronic pain   Pain Onset More than a month ago            [facility] PT Assessment - 01/17/17 0001      Assessment   Medical Diagnosis Right shoulder Impingement.     Precautions   Precautions None     Restrictions  Weight Bearing Restrictions No                     [facility] Adult PT Treatment/Exercise - 01/17/17 0001      Shoulder Exercises: Standing   Protraction Strengthening;Right;Theraband   Theraband Level (Shoulder Protraction) Level 2 (Red)   Protraction Limitations 3x10 reps   Horizontal ABduction Strengthening;Both;20 reps;Theraband   Theraband Level (Shoulder Horizontal ABduction) Level 2 (Red)   External Rotation Strengthening;Right;Theraband  3x10 reps   Theraband Level (Shoulder External Rotation) Level 2 (Red)   Internal Rotation Strengthening;Right;Theraband   Theraband Level (Shoulder Internal Rotation) Level 2 (Red)   Internal Rotation Limitations 3x10 reps   Flexion Strengthening;Both;20 reps;Weights   Shoulder Flexion Weight (lbs) 1   Extension Strengthening;Right;Theraband   Theraband Level (Shoulder Extension) Level 2 (Red)   Extension Limitations  3x10 reps   Row Strengthening;Right;Theraband   Theraband Level (Shoulder Row) Level 2 (Red)   Row Limitations 3x10 reps   Other Standing Exercises B shoulder scaption 1# 2x10 reps   Other Standing Exercises R shoulder D2 strengthening 2# x20 reps     Shoulder Exercises: Pulleys   Flexion Other (comment)  x5 min   Other Pulley Exercises Seated UE ranger CW and CCW circles x30 reps each   Other Pulley Exercises Wall slides x15 reps     Modalities   Modalities Electrical Stimulation;Moist Heat     Moist Heat Therapy   Number Minutes Moist Heat 15 Minutes   Moist Heat Location Shoulder     Electrical Stimulation   Electrical Stimulation Location R shoulder   Electrical Stimulation Action IFC   Electrical Stimulation Parameters 1-10 hz x15 min   Electrical Stimulation Goals Pain;Tone                     PT Long Term Goals - 01/09/17 1500      PT LONG TERM GOAL #1   Title independent with a HEP.   Period Weeks   Status Achieved     PT LONG TERM GOAL #2   Title Active right shoulder flexion to 155 degrees so the patient can easily reach overhead   Time 6   Period Weeks   Status On-going  AROM 138 deg R shoulder flexion 01/09/2017     PT LONG TERM GOAL #3   Title Active ER to 75 degrees+ to allow for easily donning/doffing of apparel   Period Weeks   Status On-going  AROM R shoulder ER 55 deg 01/09/2017     PT LONG TERM GOAL #4   Title Increase ROM so patient is able to reach behind back to L4 with right hand.   Time 6   Period Weeks   Status On-going     PT LONG TERM GOAL #5   Title Increase right  shoulder ER strength to a solid 4+/5 to increase stability for performance of functional activities   Time 6   Period Weeks   Status On-going     PT LONG TERM GOAL #6   Title Perform ADL's with right shoulder pain not > 3/10.   Time 6   Period Weeks   Status Partially Met               Plan - 01/17/17 1158    Clinical Impression Statement  Patient tolerated today's treatment well with 4/10 R shoulder pain today. Patient guided through ROM and strengthening for R shoulder with only reports of complaint with D2 strengthening with  2# ball. Patient provided handout for TENS unit at home. Tone palpated throughout R UT region with patient educated regarding dry needling that is present in [place]. Normal modalities response noted following removal of the modalities.   Rehab Potential Excellent   PT Frequency 2x / week   PT Duration 6 weeks   PT Treatment/Interventions ADLs/Self Care Home Management;Cryotherapy;Electrical Stimulation;Moist Heat;Ultrasound;Patient/family education;Therapeutic exercise;Therapeutic activities;Manual techniques;Passive range of motion;Dry needling;Vasopneumatic Device   PT Next Visit Plan Continue painfree R shoulder strengthening with modalities PRN per MPT POC.   Consulted and Agree with Plan of Care Patient      Patient will benefit from skilled therapeutic intervention in order to improve the following deficits and impairments:  Pain, Decreased activity tolerance, Decreased strength, Decreased range of motion  Visit Diagnosis: Chronic right shoulder pain  Stiffness of right shoulder, not elsewhere classified  Muscle weakness (generalized)     Problem List Patient Active Problem List   Diagnosis Date Noted  . History of rotator cuff tear 11/30/2016  . Impingement syndrome of right shoulder 11/30/2016  . Exacerbation of asthma 07/18/2016  . Hypokalemia 07/18/2016  . Hypertension 07/18/2016  . Depression with anxiety 07/18/2016  . Hypothyroidism 07/18/2016  . Hyperglycemia 07/18/2016  . Acute asthma exacerbation 07/18/2016  . Surgical wound dehiscence left hip; questionable superficial infection 11/10/2015  . Left hip postoperative wound infection 11/10/2015  . Osteoarthritis of left hip 09/09/2015  . 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 Obesity (BMI 35.0-39.9 without  comorbidity) 04/28/2013    Wynelle Fanny, PTA 01/17/2017, 12:11 PM  [facility] [place], [name], [address] Phone: [phone]   Fax:  [phone]  Name: KATHARINA JEHLE MRN: [id number] Date of Birth: 07/21/1971

## 2017-01-22 ENCOUNTER — Ambulatory Visit: Payer: Medicare Other | Admitting: Physical Therapy

## 2017-01-22 DIAGNOSIS — M25511 Pain in right shoulder: Secondary | ICD-10-CM | POA: Diagnosis not present

## 2017-01-22 DIAGNOSIS — G8929 Other chronic pain: Secondary | ICD-10-CM

## 2017-01-22 DIAGNOSIS — M25611 Stiffness of right shoulder, not elsewhere classified: Secondary | ICD-10-CM

## 2017-01-22 NOTE — Patient Instructions (Addendum)
  Flexibility: Upper Trapezius Stretch   Gently grasp right side of head while reaching behind back with other hand. Tilt head away until a gentle stretch is felt. Hold 30 seconds. Repeat 3 times per set. Do 2 sessions per day.  http://orth.exer.us/340   Levator Stretch   Grasp seat or sit on hand on side to be stretched. Turn head toward other side and look down. Use hand on head to gently stretch neck in that position. Hold _30___ seconds. Repeat on other side. Repeat 3 times. Do 2 sessions per day.  http://gt2.exer.us/30    Trigger Point Dry Needling  . What is Trigger Point Dry Needling (DN)? o DN is a physical therapy technique used to treat muscle pain and dysfunction. Specifically, DN helps deactivate muscle trigger points (muscle knots).  o A thin filiform needle is used to penetrate the skin and stimulate the underlying trigger point. The goal is for a local twitch response (LTR) to occur and for the trigger point to relax. No medication of any kind is injected during the procedure.   . What Does Trigger Point Dry Needling Feel Like?  o The procedure feels different for each individual patient. Some patients report that they do not actually feel the needle enter the skin and overall the process is not painful. Very mild bleeding may occur. However, many patients feel a deep cramping in the muscle in which the needle was inserted. This is the local twitch response.   [name]. How Will I feel after the treatment? o Soreness is normal, and the onset of soreness may not occur for a few hours. Typically this soreness does not last longer than two days.  o Bruising is uncommon, however; ice can be used to decrease any possible bruising.  o In rare cases feeling tired or nauseous after the treatment is normal. In addition, your symptoms may get worse before they get better, this period will typically not last longer than 24 hours.   . What Can I do After My Treatment? o Increase your  hydration by drinking more water for the next 24 hours. o You may place ice or heat on the areas treated that have become sore, however, do not use heat on inflamed or bruised areas. Heat often brings more relief post needling. o You can continue your regular activities, but vigorous activity is not recommended initially after the treatment for 24 hours. o DN is best combined with other physical therapy such as strengthening, stretching, and other therapies.    Precautions:  In some cases, dry needling is done over the lung field. While rare, there is a risk of pneumothorax (punctured lung). Because of this, if you ever experience shortness of breath on exertion, difficulty taking a deep breath, chest pain or a dry cough following dry needling, you should report to an emergency room and tell them that you have been dry needled over the thorax.  Marilyn PalmJulie Nakeitha [name], PT 01/22/17 11:28 AM [facility] [address] [place], [place], [address] Phone: [phone]   Fax:  [phone]

## 2017-01-22 NOTE — Therapy (Signed)
[facility] [place], [name], [address] Phone: [phone]   Fax:  [phone]  Physical Therapy Treatment  Patient Details  Name: Marilyn [name] MRN: [id number] Date of Birth: 1971/02/16 Referring Provider: Dondra Prader  Encounter Date: 01/22/2017      PT End of Session - 01/22/17 1124    Visit Number 10   Number of Visits 12   Date for PT Re-Evaluation 01/23/17   PT Start Time 6754   PT Stop Time 1216   PT Time Calculation (min) 52 min   Activity Tolerance Patient tolerated treatment well   Behavior During Therapy [facility] for tasks assessed/performed      Past Medical History:  Diagnosis Date  . Anemia    taking iron now. pt states having no current issues 09/02/2015  . Anginal pain ([place])    pt states experiences chest wall pain pt states related to her asthma   . Anxiety    with MRI's  . Arthritis   . Asthma   . Dizziness   . GERD (gastroesophageal reflux disease)   . Headache(784.0)    HX OF MIGRAINES  . History of bronchitis   . Hypertension   . Hypothyroidism    takes levothyroxen  . OSA on CPAP   . Shortness of breath    with exertion  . Wears glasses     Past Surgical History:  Procedure Laterality Date  . CESAREAN SECTION     times 2  . CHOLECYSTECTOMY    . ENDOMETRIAL ABLATION    . INCISION AND DRAINAGE HIP Left 11/10/2015   Procedure: IRRIGATION AND DEBRIDEMENT LEFT HIP INCISION;  Surgeon: Mcarthur Rossetti, MD;  Location: [place];  Service: Orthopedics;  Laterality: Left;  . JOINT REPLACEMENT  2011   total left knee  . KNEE ARTHROPLASTY  04/23/2012   right   . KNEE ARTHROSCOPY    . ROTATOR CUFF REPAIR     left   . SHOULDER SURGERY     right to repair ligament tear  . TOTAL HIP ARTHROPLASTY Left 09/09/2015   Procedure: LEFT TOTAL HIP ARTHROPLASTY ANTERIOR APPROACH;  Surgeon: Mcarthur Rossetti, MD;  Location: [facility];  Service: Orthopedics;  Laterality: Left;  . TOTAL KNEE  ARTHROPLASTY  04/23/2012   Procedure: TOTAL KNEE ARTHROPLASTY;  Surgeon: Newt Minion, MD;  Location: [place];  Service: Orthopedics;  Laterality: Right;  Right Total Knee Arthroplasty  . TUBAL LIGATION  1996    There were no vitals filed for this visit.      Subjective Assessment - 01/22/17 1124    Subjective Reports 4-5/10 pain. She states 30-40% improvement overall. It really hurts by 3 in the afternoon.   Limitations Sitting;Standing   How long can you sit comfortably? 20 minutes.   How long can you stand comfortably? 15-20 minutes.   Patient Stated Goals Use right UE without.   Currently in Pain? Yes   Pain Score 4    Pain Location Shoulder   Pain Orientation Right            [facility] PT Assessment - 01/22/17 0001      AROM   Right Shoulder Flexion 130 Degrees  in standing                     [facility] Adult PT Treatment/Exercise - 01/22/17 0001      Shoulder Exercises: Pulleys   Flexion Other (comment)  x5 min   Other Pulley  Exercises standing UE Ranger flexion and arcs x 3 min     Shoulder Exercises: Stretch   Other Shoulder Stretches UT and levator trap stretches x 30 seconds ea     Modalities   Modalities Electrical Stimulation     Electrical Stimulation   Electrical Stimulation Location R shoulder   Electrical Stimulation Action IFC   Electrical Stimulation Parameters 80-150 Hz x 15 min   Electrical Stimulation Goals Pain;Tone     Manual Therapy   Manual Therapy Soft tissue mobilization   Soft tissue mobilization to R UT, Lev scap, deltoids and triceps          Trigger Point Dry Needling - 01/22/17 1210    Consent Given? Yes   Education Handout Provided Yes   Muscles Treated Upper Body Upper trapezius;Levator scapulae  R: splenius capitus and deltoids (a, m, p)   Upper Trapezius Response Twitch reponse elicited;Palpable increased muscle length   Levator Scapulae Response Twitch response elicited;Palpable increased muscle length               PT Education - 01/22/17 1130    Education provided Yes   Education Details HEP; DN education and aftercare   Person(s) Educated Patient   Methods Explanation;Demonstration;Tactile cues;Verbal cues;Handout   Comprehension Verbalized understanding;Returned demonstration             PT Long Term Goals - 01/09/17 1500      PT LONG TERM GOAL #1   Title independent with a HEP.   Period Weeks   Status Achieved     PT LONG TERM GOAL #2   Title Active right shoulder flexion to 155 degrees so the patient can easily reach overhead   Time 6   Period Weeks   Status On-going  AROM 138 deg R shoulder flexion 01/09/2017     PT LONG TERM GOAL #3   Title Active ER to 75 degrees+ to allow for easily donning/doffing of apparel   Period Weeks   Status On-going  AROM R shoulder ER 55 deg 01/09/2017     PT LONG TERM GOAL #4   Title Increase ROM so patient is able to reach behind back to L4 with right hand.   Time 6   Period Weeks   Status On-going     PT LONG TERM GOAL #5   Title Increase right  shoulder ER strength to a solid 4+/5 to increase stability for performance of functional activities   Time 6   Period Weeks   Status On-going     PT LONG TERM GOAL #6   Title Perform ADL's with right shoulder pain not > 3/10.   Time 6   Period Weeks   Status Partially Met               Plan - 01/22/17 1213    Clinical Impression Statement Patient did very well with TPDN. She had ++twitch responses in delotids, UT and levator scapula. She is hypersensitive to STW in theses areas and in R triceps. She may benefit from further DN in triceps. Cervical stretches were issued.   PT Treatment/Interventions ADLs/Self Care Home Management;Cryotherapy;Electrical Stimulation;Moist Heat;Ultrasound;Patient/family education;Therapeutic exercise;Therapeutic activities;Manual techniques;Passive range of motion;Dry needling;Vasopneumatic Device   PT Next Visit Plan Assess DN,  continute STW to R shoulder girdle and triceps; Continue DN prn. Continue painfree R shoulder strengthening with modalities PRN per MPT POC.   PT Home Exercise Plan UT and lev scap stretches   Consulted and Agree with Plan of Care  Patient      Patient will benefit from skilled therapeutic intervention in order to improve the following deficits and impairments:  Pain, Decreased activity tolerance, Decreased strength, Decreased range of motion  Visit Diagnosis: Chronic right shoulder pain  Stiffness of right shoulder, not elsewhere classified       G-Codes - Jan 31, 2017 1126    Functional Assessment Tool Used (Outpatient Only) Clinical judgement.   Functional Limitation Self care   Mobility: Walking and Moving Around Current Status [phone]) At least 20 percent but less than 40 percent impaired, limited or restricted   Mobility: Walking and Moving Around Goal Status [phone]) At least 1 percent but less than 20 percent impaired, limited or restricted      Problem List Patient Active Problem List   Diagnosis Date Noted  . History of rotator cuff tear 11/30/2016  . Impingement syndrome of right shoulder 11/30/2016  . Exacerbation of asthma 07/18/2016  . Hypokalemia 07/18/2016  . Hypertension 07/18/2016  . Depression with anxiety 07/18/2016  . Hypothyroidism 07/18/2016  . Hyperglycemia 07/18/2016  . Acute asthma exacerbation 07/18/2016  . Surgical wound dehiscence left hip; questionable superficial infection 11/10/2015  . Left hip postoperative wound infection 11/10/2015  . Osteoarthritis of left hip 09/09/2015  . 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 Obesity (BMI 35.0-39.9 without comorbidity) 04/28/2013   Madelyn Flavors PT 01/31/2017, 12:31 PM  [facility] [address] [place], [name], [address] Phone: [phone]   Fax:  [phone]  Name: ELIANNA WINDOM MRN: [id number] Date of Birth: 11-12-1970

## 2017-01-24 ENCOUNTER — Ambulatory Visit: Payer: Medicare Other | Admitting: Physical Therapy

## 2017-01-24 ENCOUNTER — Encounter: Payer: Self-pay | Admitting: Physical Therapy

## 2017-01-24 DIAGNOSIS — G8929 Other chronic pain: Secondary | ICD-10-CM

## 2017-01-24 DIAGNOSIS — M6281 Muscle weakness (generalized): Secondary | ICD-10-CM

## 2017-01-24 DIAGNOSIS — M25611 Stiffness of right shoulder, not elsewhere classified: Secondary | ICD-10-CM

## 2017-01-24 DIAGNOSIS — M25511 Pain in right shoulder: Secondary | ICD-10-CM | POA: Diagnosis not present

## 2017-01-24 NOTE — Therapy (Signed)
[facility] [place], [name], [address] Phone: [phone]   Fax:  [phone]  Physical Therapy Treatment  Patient Details  Name: Marilyn [name] MRN: [id number] Date of Birth: [birth year] Referring Provider: Dondra Prader  Encounter Date: 01/24/2017      PT End of Session - 01/24/17 1241    Visit Number 11   Number of Visits 12   Date for PT Re-Evaluation 01/23/17   PT Start Time 1030   PT Stop Time 1125   PT Time Calculation (min) 55 min   Activity Tolerance Patient tolerated treatment well   Behavior During Therapy [facility] for tasks assessed/performed      Past Medical History:  Diagnosis Date  . Anemia    taking iron now. pt states having no current issues 09/02/2015  . Anginal pain ([place])    pt states experiences chest wall pain pt states related to her asthma   . Anxiety    with MRI's  . Arthritis   . Asthma   . Dizziness   . GERD (gastroesophageal reflux disease)   . Headache(784.0)    HX OF MIGRAINES  . History of bronchitis   . Hypertension   . Hypothyroidism    takes levothyroxen  . OSA on CPAP   . Shortness of breath    with exertion  . Wears glasses     Past Surgical History:  Procedure Laterality Date  . CESAREAN SECTION     times 2  . CHOLECYSTECTOMY    . ENDOMETRIAL ABLATION    . INCISION AND DRAINAGE HIP Left 11/10/2015   Procedure: IRRIGATION AND DEBRIDEMENT LEFT HIP INCISION;  Surgeon: Mcarthur Rossetti, MD;  Location: [place];  Service: Orthopedics;  Laterality: Left;  . JOINT REPLACEMENT  2011   total left knee  . KNEE ARTHROPLASTY  04/23/2012   right   . KNEE ARTHROSCOPY    . ROTATOR CUFF REPAIR     left   . SHOULDER SURGERY     right to repair ligament tear  . TOTAL HIP ARTHROPLASTY Left 09/09/2015   Procedure: LEFT TOTAL HIP ARTHROPLASTY ANTERIOR APPROACH;  Surgeon: Mcarthur Rossetti, MD;  Location[facility];  Service: Orthopedics;  Laterality: Left;  . TOTAL KNEE  ARTHROPLASTY  04/23/2012   Procedure: TOTAL KNEE ARTHROPLASTY;  Surgeon: Newt Minion, MD;  Location: [place];  Service: Orthopedics;  Laterality: Right;  Right Total Knee Arthroplasty  . TUBAL LIGATION  1996    There were no vitals filed for this visit.      Subjective Assessment - 01/24/17 1238    Subjective Pt arriving to therapy today reporting 5/10 R shoulder pain. Pt reporting dry needling really helped after her last visit and pt reporting no pain for several hours following her session. Discussed extending pt's POC in order to continue with Dry Needling and progress toward goals set.    Pertinent History Previous right shoulder surgery.   Limitations Sitting;Standing   How long can you sit comfortably? 20 minutes.   How long can you stand comfortably? 15-20 minutes.   Patient Stated Goals Use right UE without.   Currently in Pain? Yes   Pain Score 5    Pain Location Shoulder   Pain Orientation Right   Pain Descriptors / Indicators Aching;Sore   Pain Onset More than a month ago   Pain Frequency Constant   Aggravating Factors  working, certain movements   Pain Relieving Factors changing positions, heat, resting, ice  Effect of Pain on Daily Activities difficulty reaching overhead and performing work duties all day            [facility] PT Assessment - 01/24/17 0001      Assessment   Medical Diagnosis right shoulder impingment     Precautions   Precautions None                     [facility] Adult PT Treatment/Exercise - 01/24/17 0001      Exercises   Exercises Shoulder     Shoulder Exercises: Standing   Other Standing Exercises Standing with 2# ball PNF D! and D2 patterns     Shoulder Exercises: Pulleys   Flexion Other (comment)  x5 min   Other Pulley Exercises standing UE Ranger flexion and arcs x 3 min     Modalities   Modalities Electrical Stimulation;Vasopneumatic     Vasopneumatic   Number Minutes Vasopneumatic  15 minutes   Vasopnuematic  Location  Shoulder   Vasopneumatic Pressure Medium   Vasopneumatic Temperature  34     Manual Therapy   Manual Therapy Soft tissue mobilization   Soft tissue mobilization to R UT, Lev scap, deltoids and triceps                PT Education - 01/24/17 1241    Education Details Discussed POC with pt   Person(s) Educated Patient   Methods Explanation   Comprehension Verbalized understanding             PT Long Term Goals - 01/09/17 1500      PT LONG TERM GOAL #1   Title independent with a HEP.   Period Weeks   Status Achieved     PT LONG TERM GOAL #2   Title Active right shoulder flexion to 155 degrees so the patient can easily reach overhead   Time 6   Period Weeks   Status On-going  AROM 138 deg R shoulder flexion 01/09/2017     PT LONG TERM GOAL #3   Title Active ER to 75 degrees+ to allow for easily donning/doffing of apparel   Period Weeks   Status On-going  AROM R shoulder ER 55 deg 01/09/2017     PT LONG TERM GOAL #4   Title Increase ROM so patient is able to reach behind back to L4 with right hand.   Time 6   Period Weeks   Status On-going     PT LONG TERM GOAL #5   Title Increase right  shoulder ER strength to a solid 4+/5 to increase stability for performance of functional activities   Time 6   Period Weeks   Status On-going     PT LONG TERM GOAL #6   Title Perform ADL's with right shoulder pain not > 3/10.   Time 6   Period Weeks   Status Partially Met               Plan - 01/24/17 1242    Clinical Impression Statement Patient arriving to therapy reporting 5/10 pain and leaving reporting 3/10 in her right shoulder. Pt with positive results following dry needling. We discussed Re-cert to perform further dry needling and progress toward her goals not met.    Rehab Potential Excellent   PT Frequency 2x / week   PT Duration 6 weeks   PT Treatment/Interventions ADLs/Self Care Home Management;Cryotherapy;Electrical Stimulation;Moist  Heat;Ultrasound;Patient/family education;Therapeutic exercise;Therapeutic activities;Manual techniques;Passive range of motion;Dry needling;Vasopneumatic Device   PT Next  Visit Plan Assess DN, continute STW to R shoulder girdle and triceps; Continue DN, Continue painfree R shoulder strengthening with modalities PRN per MPT POC.   PT Home Exercise Plan UT and lev scap stretches   Consulted and Agree with Plan of Care Patient      Patient will benefit from skilled therapeutic intervention in order to improve the following deficits and impairments:  Pain, Decreased activity tolerance, Decreased strength, Decreased range of motion  Visit Diagnosis: Chronic right shoulder pain  Stiffness of right shoulder, not elsewhere classified  Muscle weakness (generalized)       G-Codes - 02/22/17 1245    Functional Assessment Tool Used (Outpatient Only) Clinical judgement.   Functional Limitation Mobility: Walking and moving around   Mobility: Walking and Moving Around Current Status [phone]) At least 20 percent but less than 40 percent impaired, limited or restricted   Mobility: Walking and Moving Around Goal Status [phone]) At least 1 percent but less than 20 percent impaired, limited or restricted      Problem List Patient Active Problem List   Diagnosis Date Noted  . History of rotator cuff tear 11/30/2016  . Impingement syndrome of right shoulder 11/30/2016  . Exacerbation of asthma 07/18/2016  . Hypokalemia 07/18/2016  . Hypertension 07/18/2016  . Depression with anxiety 07/18/2016  . Hypothyroidism 07/18/2016  . Hyperglycemia 07/18/2016  . Acute asthma exacerbation 07/18/2016  . Surgical wound dehiscence left hip; questionable superficial infection 11/10/2015  . Left hip postoperative wound infection 11/10/2015  . Osteoarthritis of left hip 09/09/2015  . 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 Obesity (BMI 35.0-39.9 without comorbidity) 04/28/2013    Oretha Caprice, MPT  02/22/2017, 12:53 PM  [facility] [place], [name], [address] Phone: [phone]   Fax:  [phone]  Name: Marilyn [name] MRN: [id number] Date of Birth: [birth year]

## 2017-01-28 ENCOUNTER — Encounter: Payer: Medicare Other | Admitting: Physical Therapy

## 2017-01-30 ENCOUNTER — Ambulatory Visit: Payer: Medicare Other | Admitting: Physical Therapy

## 2017-01-30 ENCOUNTER — Encounter: Payer: Self-pay | Admitting: Physical Therapy

## 2017-01-30 DIAGNOSIS — M25511 Pain in right shoulder: Principal | ICD-10-CM

## 2017-01-30 DIAGNOSIS — G8929 Other chronic pain: Secondary | ICD-10-CM

## 2017-01-30 DIAGNOSIS — M6281 Muscle weakness (generalized): Secondary | ICD-10-CM

## 2017-01-30 DIAGNOSIS — M25611 Stiffness of right shoulder, not elsewhere classified: Secondary | ICD-10-CM

## 2017-01-30 NOTE — Therapy (Signed)
[facility] [place], [name], [address] Phone: [phone]   Fax:  [phone]  Physical Therapy Treatment  Patient Details  Name: Marilyn [name] MRN: [id number] Date of Birth: 1970-11-21 Referring Provider: Dondra Prader  Encounter Date: 01/30/2017      PT End of Session - 01/30/17 1125    Visit Number 12   Number of Visits 20   Date for PT Re-Evaluation 03/26/17   Authorization Type Sent Re-cert on 0/62/37   PT Start Time 1123   PT Stop Time 1210   PT Time Calculation (min) 47 min   Activity Tolerance Patient tolerated treatment well   Behavior During Therapy [facility] for tasks assessed/performed      Past Medical History:  Diagnosis Date  . Anemia    taking iron now. pt states having no current issues 09/02/2015  . Anginal pain ([place])    pt states experiences chest wall pain pt states related to her asthma   . Anxiety    with MRI's  . Arthritis   . Asthma   . Dizziness   . GERD (gastroesophageal reflux disease)   . Headache(784.0)    HX OF MIGRAINES  . History of bronchitis   . Hypertension   . Hypothyroidism    takes levothyroxen  . OSA on CPAP   . Shortness of breath    with exertion  . Wears glasses     Past Surgical History:  Procedure Laterality Date  . CESAREAN SECTION     times 2  . CHOLECYSTECTOMY    . ENDOMETRIAL ABLATION    . INCISION AND DRAINAGE HIP Left 11/10/2015   Procedure: IRRIGATION AND DEBRIDEMENT LEFT HIP INCISION;  Surgeon: Mcarthur Rossetti, MD;  Location: [place];  Service: Orthopedics;  Laterality: Left;  . JOINT REPLACEMENT  2011   total left knee  . KNEE ARTHROPLASTY  04/23/2012   right   . KNEE ARTHROSCOPY    . ROTATOR CUFF REPAIR     left   . SHOULDER SURGERY     right to repair ligament tear  . TOTAL HIP ARTHROPLASTY Left 09/09/2015   Procedure: LEFT TOTAL HIP ARTHROPLASTY ANTERIOR APPROACH;  Surgeon: Mcarthur Rossetti, MD;  Location: [facility];  Service:  Orthopedics;  Laterality: Left;  . TOTAL KNEE ARTHROPLASTY  04/23/2012   Procedure: TOTAL KNEE ARTHROPLASTY;  Surgeon: Newt Minion, MD;  Location: [place];  Service: Orthopedics;  Laterality: Right;  Right Total Knee Arthroplasty  . TUBAL LIGATION  1996    There were no vitals filed for this visit.      Subjective Assessment - 01/30/17 1124    Subjective Reports that she has been having more pain recently but thinks that may be due to rainy weather. Reports such intense R shoulder pain Sunday that she went to bed with an ice pack on.   Pertinent History Previous right shoulder surgery.   Limitations Sitting;Standing   How long can you sit comfortably? 20 minutes.   How long can you stand comfortably? 15-20 minutes.   Patient Stated Goals Use right UE without.   Currently in Pain? Yes   Pain Score 6    Pain Location Shoulder   Pain Orientation Right   Pain Descriptors / Indicators Aching;Sore   Pain Type Chronic pain   Pain Onset More than a month ago            [facility] PT Assessment - 01/30/17 0001      Assessment  Medical Diagnosis right shoulder impingment   Next MD Visit None     Precautions   Precautions None                     [facility] Adult PT Treatment/Exercise - 01/30/17 0001      Shoulder Exercises: Standing   External Rotation Strengthening;Right  R shoulder ER eccentric deloading 10# 3x1 min each   Flexion Strengthening;Both;20 reps;Weights   Shoulder Flexion Weight (lbs) 2   Flexion Limitations R shoulder flexion eccentric deloading with 10#   ABduction Strengthening;Right  R shoulder abduction eccentric deloading with 10#   ABduction Limitations 3x 1 min each   Other Standing Exercises B shoulder abduction 1# 2x10 reps     Shoulder Exercises: Pulleys   Flexion Other (comment)  x5 min   Other Pulley Exercises Wall slide x20 reps     Shoulder Exercises: ROM/Strengthening   UBE (Upper Arm Bike) 120 RPM x5 min     Modalities   Modalities  Electrical Stimulation;Moist Heat     Moist Heat Therapy   Number Minutes Moist Heat 15 Minutes   Moist Heat Location Shoulder     Electrical Stimulation   Electrical Stimulation Location R shoulder   Electrical Stimulation Action IFC   Electrical Stimulation Parameters 1-10 hz x15 min   Electrical Stimulation Goals Pain;Tone                     PT Long Term Goals - 01/09/17 1500      PT LONG TERM GOAL #1   Title independent with a HEP.   Period Weeks   Status Achieved     PT LONG TERM GOAL #2   Title Active right shoulder flexion to 155 degrees so the patient can easily reach overhead   Time 6   Period Weeks   Status On-going  AROM 138 deg R shoulder flexion 01/09/2017     PT LONG TERM GOAL #3   Title Active ER to 75 degrees+ to allow for easily donning/doffing of apparel   Period Weeks   Status On-going  AROM R shoulder ER 55 deg 01/09/2017     PT LONG TERM GOAL #4   Title Increase ROM so patient is able to reach behind back to L4 with right hand.   Time 6   Period Weeks   Status On-going     PT LONG TERM GOAL #5   Title Increase right  shoulder ER strength to a solid 4+/5 to increase stability for performance of functional activities   Time 6   Period Weeks   Status On-going     PT LONG TERM GOAL #6   Title Perform ADL's with right shoulder pain not > 3/10.   Time 6   Period Weeks   Status Partially Met               Plan - 01/30/17 1201    Clinical Impression Statement Patient arrived to treatment with what she perceives as weather related R shoulder pain. Patient did experience pain with trial of 5 min on UBE with 120 RPM but was able to push through to finish exercise. Eccentric deloading exercises for R shoulder to improve eccentric control and to improve opposite ROM. 10# for flexion and abduction eccentric control appeared to be easy thus patient educated that 20# may be completed tomorrow. Normal modalities response noted following  removal of the modalities.   Rehab Potential Excellent   PT Frequency  2x / week   PT Duration 6 weeks   PT Treatment/Interventions ADLs/Self Care Home Management;Cryotherapy;Electrical Stimulation;Moist Heat;Ultrasound;Patient/family education;Therapeutic exercise;Therapeutic activities;Manual techniques;Passive range of motion;Dry needling;Vasopneumatic Device   PT Next Visit Plan Attempt 20# eccentric deloading for flexion and abduction next treatment.   PT Home Exercise Plan UT and lev scap stretches   Consulted and Agree with Plan of Care Patient      Patient will benefit from skilled therapeutic intervention in order to improve the following deficits and impairments:  Pain, Decreased activity tolerance, Decreased strength, Decreased range of motion  Visit Diagnosis: Chronic right shoulder pain  Stiffness of right shoulder, not elsewhere classified  Muscle weakness (generalized)     Problem List Patient Active Problem List   Diagnosis Date Noted  . History of rotator cuff tear 11/30/2016  . Impingement syndrome of right shoulder 11/30/2016  . Exacerbation of asthma 07/18/2016  . Hypokalemia 07/18/2016  . Hypertension 07/18/2016  . Depression with anxiety 07/18/2016  . Hypothyroidism 07/18/2016  . Hyperglycemia 07/18/2016  . Acute asthma exacerbation 07/18/2016  . Surgical wound dehiscence left hip; questionable superficial infection 11/10/2015  . Left hip postoperative wound infection 11/10/2015  . Osteoarthritis of left hip 09/09/2015  . 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 Obesity (BMI 35.0-39.9 without comorbidity) 04/28/2013    Wynelle Fanny, PTA 01/30/2017, 12:16 PM  [facility] [address] [place], [name], [address] Phone: [phone]   Fax:  [phone]  Name: MIYANA MORDECAI MRN: [id number] Date of Birth: 1971-04-17

## 2017-01-31 ENCOUNTER — Encounter: Payer: Self-pay | Admitting: Physical Therapy

## 2017-01-31 ENCOUNTER — Ambulatory Visit: Payer: Medicare Other | Admitting: Physical Therapy

## 2017-01-31 DIAGNOSIS — M25611 Stiffness of right shoulder, not elsewhere classified: Secondary | ICD-10-CM

## 2017-01-31 DIAGNOSIS — G8929 Other chronic pain: Secondary | ICD-10-CM

## 2017-01-31 DIAGNOSIS — M25511 Pain in right shoulder: Secondary | ICD-10-CM | POA: Diagnosis not present

## 2017-01-31 DIAGNOSIS — M6281 Muscle weakness (generalized): Secondary | ICD-10-CM

## 2017-01-31 NOTE — Therapy (Signed)
[facility] [place], [name], [address] Phone: [phone]   Fax:  [phone]  Physical Therapy Treatment  Patient Details  Name: Marilyn [name] MRN: [id number] Date of Birth: 1971/08/08 Referring Provider: Dondra Prader  Encounter Date: 01/31/2017      PT End of Session - 01/31/17 1303    Visit Number 13   Number of Visits 20   Date for PT Re-Evaluation 03/26/17   PT Start Time 1230   PT Stop Time 1319   PT Time Calculation (min) 49 min   Activity Tolerance Patient tolerated treatment well   Behavior During Therapy [facility] for tasks assessed/performed      Past Medical History:  Diagnosis Date  . Anemia    taking iron now. pt states having no current issues 09/02/2015  . Anginal pain ([place])    pt states experiences chest wall pain pt states related to her asthma   . Anxiety    with MRI's  . Arthritis   . Asthma   . Dizziness   . GERD (gastroesophageal reflux disease)   . Headache(784.0)    HX OF MIGRAINES  . History of bronchitis   . Hypertension   . Hypothyroidism    takes levothyroxen  . OSA on CPAP   . Shortness of breath    with exertion  . Wears glasses     Past Surgical History:  Procedure Laterality Date  . CESAREAN SECTION     times 2  . CHOLECYSTECTOMY    . ENDOMETRIAL ABLATION    . INCISION AND DRAINAGE HIP Left 11/10/2015   Procedure: IRRIGATION AND DEBRIDEMENT LEFT HIP INCISION;  Surgeon: Mcarthur Rossetti, MD;  Location: [place];  Service: Orthopedics;  Laterality: Left;  . JOINT REPLACEMENT  2011   total left knee  . KNEE ARTHROPLASTY  04/23/2012   right   . KNEE ARTHROSCOPY    . ROTATOR CUFF REPAIR     left   . SHOULDER SURGERY     right to repair ligament tear  . TOTAL HIP ARTHROPLASTY Left 09/09/2015   Procedure: LEFT TOTAL HIP ARTHROPLASTY ANTERIOR APPROACH;  Surgeon: Mcarthur Rossetti, MD;  [facility];  Service: Orthopedics;  Laterality: Left;  . TOTAL KNEE  ARTHROPLASTY  04/23/2012   Procedure: TOTAL KNEE ARTHROPLASTY;  Surgeon: Newt Minion, MD;  Location: [place];  Service: Orthopedics;  Laterality: Right;  Right Total Knee Arthroplasty  . TUBAL LIGATION  1996    There were no vitals filed for this visit.      Subjective Assessment - 01/31/17 1237    Subjective Patient reported doing good after last treatment   Pertinent History Previous right shoulder surgery.   Limitations Sitting;Standing   How long can you sit comfortably? 20 minutes.   Patient Stated Goals Use right UE without.   Currently in Pain? Yes   Pain Score 6    Pain Location Shoulder   Pain Orientation Right   Pain Descriptors / Indicators Aching;Sore   Pain Type Chronic pain   Pain Onset More than a month ago   Pain Frequency Constant   Aggravating Factors  increased activity   Pain Relieving Factors rest            [facility] PT Assessment - 01/31/17 0001      AROM   AROM Assessment Site Shoulder   Right/Left Shoulder Right   Right Shoulder Flexion 145 Degrees  [facility] Adult PT Treatment/Exercise - 01/31/17 0001      Shoulder Exercises: Standing   Row Strengthening;Both;20 reps;Theraband   Theraband Level (Shoulder Row) Level 3 (Green)   Other Standing Exercises right shoulder flexion and abd eccentric contol 90-0 degrees '@20' # 3x36mn each      Shoulder Exercises: Pulleys   Flexion Other (comment)  587m   Other Pulley Exercises Wall slide x20 reps     Shoulder Exercises: ROM/Strengthening   Wall Pushups 20 reps     Moist Heat Therapy   Number Minutes Moist Heat 15 Minutes   Moist Heat Location Shoulder     Electrical Stimulation   Electrical Stimulation Location R shoulder   Electrical Stimulation Action IFC   Electrical Stimulation Parameters 1-'10hz'  x1518m  Electrical Stimulation Goals Pain;Tone                     PT Long Term Goals - 01/31/17 1308      PT LONG TERM GOAL #1   Title independent  with a HEP.   Time 6   Period Weeks   Status Achieved     PT LONG TERM GOAL #2   Title Active right shoulder flexion to 155 degrees so the patient can easily reach overhead   Time 6   Period Weeks   Status On-going  AROM 145 degrees 01/31/17     PT LONG TERM GOAL #3   Title Active ER to 75 degrees+ to allow for easily donning/doffing of apparel   Time 6   Period Weeks   Status On-going     PT LONG TERM GOAL #4   Title Increase ROM so patient is able to reach behind back to L4 with right hand.   Time 6   Period Weeks   Status On-going     PT LONG TERM GOAL #5   Title Increase right  shoulder ER strength to a solid 4+/5 to increase stability for performance of functional activities   Time 6   Period Weeks   Status On-going     PT LONG TERM GOAL #6   Title Perform ADL's with right shoulder pain not > 3/10.   Time 6   Period Weeks   Status Partially Met               Plan - 01/31/17 1309    Clinical Impression Statement Patient tolerated treatment well today. Patient reported doing better overall yet has increased soreness due to weather. Patient improved active ROM today. Patient current goals ongoing due to pain, ROM and strength deficts.    Rehab Potential Excellent   PT Frequency 2x / week   PT Duration 6 weeks   PT Treatment/Interventions ADLs/Self Care Home Management;Cryotherapy;Electrical Stimulation;Moist Heat;Ultrasound;Patient/family education;Therapeutic exercise;Therapeutic activities;Manual techniques;Passive range of motion;Dry needling;Vasopneumatic Device   PT Next Visit Plan cont with POC for ROM and strength/modalities PRN   Consulted and Agree with Plan of Care Patient      Patient will benefit from skilled therapeutic intervention in order to improve the following deficits and impairments:  Pain, Decreased activity tolerance, Decreased strength, Decreased range of motion  Visit Diagnosis: Chronic right shoulder pain  Stiffness of right  shoulder, not elsewhere classified  Muscle weakness (generalized)     Problem List Patient Active Problem List   Diagnosis Date Noted  . History of rotator cuff tear 11/30/2016  . Impingement syndrome of right shoulder 11/30/2016  . Exacerbation of asthma 07/18/2016  . Hypokalemia 07/18/2016  .  Hypertension 07/18/2016  . Depression with anxiety 07/18/2016  . Hypothyroidism 07/18/2016  . Hyperglycemia 07/18/2016  . Acute asthma exacerbation 07/18/2016  . Surgical wound dehiscence left hip; questionable superficial infection 11/10/2015  . Left hip postoperative wound infection 11/10/2015  . Osteoarthritis of left hip 09/09/2015  . 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 Obesity (BMI 35.0-39.9 without comorbidity) 04/28/2013    DUNFORD, CHRISTINA P, PTA 01/31/2017, 1:22 PM  [facility] [place], [name], [address] Phone: [phone]   Fax:  [phone]  Name: Marilyn [name] MRN: [id number] Date of Birth: 1971-04-03

## 2017-02-04 ENCOUNTER — Other Ambulatory Visit: Payer: Self-pay | Admitting: Adult Health Nurse Practitioner

## 2017-02-04 DIAGNOSIS — Z1231 Encounter for screening mammogram for malignant neoplasm of breast: Secondary | ICD-10-CM

## 2017-02-05 ENCOUNTER — Ambulatory Visit: Payer: Medicare Other | Admitting: Physical Therapy

## 2017-02-05 DIAGNOSIS — G8929 Other chronic pain: Secondary | ICD-10-CM

## 2017-02-05 DIAGNOSIS — M25611 Stiffness of right shoulder, not elsewhere classified: Secondary | ICD-10-CM

## 2017-02-05 DIAGNOSIS — M25511 Pain in right shoulder: Secondary | ICD-10-CM | POA: Diagnosis not present

## 2017-02-05 NOTE — Therapy (Signed)
[facility] [place], [name], [address] Phone: [phone]   Fax:  [phone]  Physical Therapy Treatment  Patient Details  Name: Marilyn [name] MRN: [id number] Date of Birth: 04/23/1971 Referring Provider: Dondra Prader  Encounter Date: 02/05/2017      PT End of Session - 02/05/17 1120    Visit Number 14   Number of Visits 20   Date for PT Re-Evaluation 03/26/17   Authorization Type Sent Re-cert on 9/57/47   PT Start Time 1116   PT Stop Time 1214   PT Time Calculation (min) 58 min   Activity Tolerance Patient tolerated treatment well   Behavior During Therapy [facility] for tasks assessed/performed      Past Medical History:  Diagnosis Date  . Anemia    taking iron now. pt states having no current issues 09/02/2015  . Anginal pain ([place])    pt states experiences chest wall pain pt states related to her asthma   . Anxiety    with MRI's  . Arthritis   . Asthma   . Dizziness   . GERD (gastroesophageal reflux disease)   . Headache(784.0)    HX OF MIGRAINES  . History of bronchitis   . Hypertension   . Hypothyroidism    takes levothyroxen  . OSA on CPAP   . Shortness of breath    with exertion  . Wears glasses     Past Surgical History:  Procedure Laterality Date  . CESAREAN SECTION     times 2  . CHOLECYSTECTOMY    . ENDOMETRIAL ABLATION    . INCISION AND DRAINAGE HIP Left 11/10/2015   Procedure: IRRIGATION AND DEBRIDEMENT LEFT HIP INCISION;  Surgeon: Mcarthur Rossetti, MD;  Location: [place];  Service: Orthopedics;  Laterality: Left;  . JOINT REPLACEMENT  2011   total left knee  . KNEE ARTHROPLASTY  04/23/2012   right   . KNEE ARTHROSCOPY    . ROTATOR CUFF REPAIR     left   . SHOULDER SURGERY     right to repair ligament tear  . TOTAL HIP ARTHROPLASTY Left 09/09/2015   Procedure: LEFT TOTAL HIP ARTHROPLASTY ANTERIOR APPROACH;  Surgeon: Mcarthur Rossetti, MD;  [facility];  Service:  Orthopedics;  Laterality: Left;  . TOTAL KNEE ARTHROPLASTY  04/23/2012   Procedure: TOTAL KNEE ARTHROPLASTY;  Surgeon: Newt Minion, MD;  Location: [place];  Service: Orthopedics;  Laterality: Right;  Right Total Knee Arthroplasty  . TUBAL LIGATION  1996    There were no vitals filed for this visit.      Subjective Assessment - 02/05/17 1119    Subjective Patient states she is really hurting today and last night had pain up into neck.   Pertinent History Previous right shoulder surgery.   Limitations Sitting;Standing   How long can you sit comfortably? 20 minutes.   How long can you stand comfortably? 15-20 minutes.   Patient Stated Goals Use right UE without.   Currently in Pain? Yes   Pain Score 5    Pain Location Shoulder   Pain Orientation Right   Pain Descriptors / Indicators Aching;Sore            [facility] PT Assessment - 02/05/17 0001      AROM   Right Shoulder Flexion 145 Degrees  120 standing   Right Shoulder External Rotation 30 Degrees  35 passive; limited by pain  [place] Adult PT Treatment/Exercise - 02/05/17 0001      Shoulder Exercises: Supine   Other Supine Exercises supine cane for ER x 10 with VCs and TCs     Shoulder Exercises: Pulleys   Flexion Other (comment)  64mn     Shoulder Exercises: ROM/Strengthening   UBE (Upper Arm Bike) 120 RPM x5 min     Modalities   Modalities Electrical Stimulation;Moist Heat     Moist Heat Therapy   Number Minutes Moist Heat 15 Minutes   Moist Heat Location Shoulder     Electrical Stimulation   Electrical Stimulation Location R shoulder   Electrical Stimulation Action IFC    Electrical Stimulation Parameters 80-150 Hz x 15 min   Electrical Stimulation Goals Pain;Tone     Manual Therapy   Manual Therapy Soft tissue mobilization;Myofascial release   Soft tissue mobilization to R UT, Lev Scap, cervical paraspinals and suboccipitals, R distal triceps and deltoids   Myofascial  Release R UT/levator scap          Trigger Point Dry Needling - 02/05/17 1207    Consent Given? Yes   Education Handout Provided No   Muscles Treated Upper Body Upper trapezius;Suboccipitals muscle group;Levator scapulae  R; and deltoids middle and distal triceps   Upper Trapezius Response Twitch reponse elicited;Palpable increased muscle length   SubOccipitals Response Twitch response elicited;Palpable increased muscle length   Levator Scapulae Response Palpable increased muscle length;Twitch response elicited              PT Education - 02/05/17 1220    Education provided Yes   Education Details supine cane for ER with elbow at 45 deg ABD   Person(s) Educated Patient   Methods Explanation;Demonstration;Tactile cues;Verbal cues   Comprehension Returned demonstration;Verbalized understanding             PT Long Term Goals - 01/31/17 1308      PT LONG TERM GOAL #1   Title independent with a HEP.   Time 6   Period Weeks   Status Achieved     PT LONG TERM GOAL #2   Title Active right shoulder flexion to 155 degrees so the patient can easily reach overhead   Time 6   Period Weeks   Status On-going  AROM 145 degrees 01/31/17     PT LONG TERM GOAL #3   Title Active ER to 75 degrees+ to allow for easily donning/doffing of apparel   Time 6   Period Weeks   Status On-going     PT LONG TERM GOAL #4   Title Increase ROM so patient is able to reach behind back to L4 with right hand.   Time 6   Period Weeks   Status On-going     PT LONG TERM GOAL #5   Title Increase right  shoulder ER strength to a solid 4+/5 to increase stability for performance of functional activities   Time 6   Period Weeks   Status On-going     PT LONG TERM GOAL #6   Title Perform ADL's with right shoulder pain not > 3/10.   Time 6   Period Weeks   Status Partially Met               Plan - 02/05/17 1212    Clinical Impression Statement Patient did well with DN today. She  has significant tone in L UT and levator scap due to compensation from weak shoulder. She responds well to DN with decrease  in muscle tension afterwards. She continues to have significant ROM deficits with pain being the limiting factor for her. ER is limited with 45 degrees ABD vs. elbow at side.    PT Treatment/Interventions ADLs/Self Care Home Management;Cryotherapy;Electrical Stimulation;Moist Heat;Ultrasound;Patient/family education;Therapeutic exercise;Therapeutic activities;Manual techniques;Passive range of motion;Dry needling;Vasopneumatic Device   PT Next Visit Plan cont with POC for ROM and strength/modalities PRN      Patient will benefit from skilled therapeutic intervention in order to improve the following deficits and impairments:  Pain, Decreased activity tolerance, Decreased strength, Decreased range of motion  Visit Diagnosis: Chronic right shoulder pain  Stiffness of right shoulder, not elsewhere classified     Problem List Patient Active Problem List   Diagnosis Date Noted  . History of rotator cuff tear 11/30/2016  . Impingement syndrome of right shoulder 11/30/2016  . Exacerbation of asthma 07/18/2016  . Hypokalemia 07/18/2016  . Hypertension 07/18/2016  . Depression with anxiety 07/18/2016  . Hypothyroidism 07/18/2016  . Hyperglycemia 07/18/2016  . Acute asthma exacerbation 07/18/2016  . Surgical wound dehiscence left hip; questionable superficial infection 11/10/2015  . Left hip postoperative wound infection 11/10/2015  . Osteoarthritis of left hip 09/09/2015  . 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 Obesity (BMI 35.0-39.9 without comorbidity) 04/28/2013   Madelyn Flavors PT 02/05/2017, 12:25 PM  [facility] [address] [place], [name], [address] Phone: [phone]   Fax:  [phone]  Name: Marilyn [name] MRN: [id number] Date of Birth: [birth year]

## 2017-02-07 ENCOUNTER — Encounter: Payer: Self-pay | Admitting: Physical Therapy

## 2017-02-07 ENCOUNTER — Ambulatory Visit: Payer: Medicare Other | Admitting: Physical Therapy

## 2017-02-07 DIAGNOSIS — G8929 Other chronic pain: Secondary | ICD-10-CM

## 2017-02-07 DIAGNOSIS — M6281 Muscle weakness (generalized): Secondary | ICD-10-CM

## 2017-02-07 DIAGNOSIS — M25611 Stiffness of right shoulder, not elsewhere classified: Secondary | ICD-10-CM

## 2017-02-07 DIAGNOSIS — M25511 Pain in right shoulder: Secondary | ICD-10-CM | POA: Diagnosis not present

## 2017-02-07 NOTE — Therapy (Signed)
[facility] [place], [name], [address] Phone: [phone]   Fax:  [phone]  Physical Therapy Treatment  Patient Details  Name: Marilyn [name] MRN: [id number] Date of Birth: 04-Dec-1970 Referring Provider: Dondra Prader  Encounter Date: 02/07/2017      PT End of Session - 02/07/17 1319    Visit Number 15   Number of Visits 20   Date for PT Re-Evaluation 03/26/17   Authorization Type Sent Re-cert on 3/33/54   PT Start Time 1230   PT Stop Time 1315   PT Time Calculation (min) 45 min   Activity Tolerance Patient tolerated treatment well      Past Medical History:  Diagnosis Date  . Anemia    taking iron now. pt states having no current issues 09/02/2015  . Anginal pain ([place])    pt states experiences chest wall pain pt states related to her asthma   . Anxiety    with MRI's  . Arthritis   . Asthma   . Dizziness   . GERD (gastroesophageal reflux disease)   . Headache(784.0)    HX OF MIGRAINES  . History of bronchitis   . Hypertension   . Hypothyroidism    takes levothyroxen  . OSA on CPAP   . Shortness of breath    with exertion  . Wears glasses     Past Surgical History:  Procedure Laterality Date  . CESAREAN SECTION     times 2  . CHOLECYSTECTOMY    . ENDOMETRIAL ABLATION    . INCISION AND DRAINAGE HIP Left 11/10/2015   Procedure: IRRIGATION AND DEBRIDEMENT LEFT HIP INCISION;  Surgeon: Mcarthur Rossetti, MD;  Location: [place];  Service: Orthopedics;  Laterality: Left;  . JOINT REPLACEMENT  2011   total left knee  . KNEE ARTHROPLASTY  04/23/2012   right   . KNEE ARTHROSCOPY    . ROTATOR CUFF REPAIR     left   . SHOULDER SURGERY     right to repair ligament tear  . TOTAL HIP ARTHROPLASTY Left 09/09/2015   Procedure: LEFT TOTAL HIP ARTHROPLASTY ANTERIOR APPROACH;  Surgeon: Mcarthur Rossetti, MD;  Location[facility];  Service: Orthopedics;  Laterality: Left;  . TOTAL KNEE ARTHROPLASTY   04/23/2012   Procedure: TOTAL KNEE ARTHROPLASTY;  Surgeon: Newt Minion, MD;  Location: [place];  Service: Orthopedics;  Laterality: Right;  Right Total Knee Arthroplasty  . TUBAL LIGATION  1996    There were no vitals filed for this visit.      Subjective Assessment - 02/07/17 1241    Subjective Patient arriving to therapy reporting 7/10 pain in right shoulder. Pt reported she was going to follow up with her physician for a X-ray or MRI to further evaluate her R shoulder.    Pertinent History Previous right shoulder surgery.   Limitations Sitting;Standing   How long can you sit comfortably? 20 minutes.   How long can you stand comfortably? 15-20 minutes.   Patient Stated Goals Use right UE without.   Currently in Pain? Yes   Pain Score 7    Pain Location Shoulder   Pain Orientation Right   Pain Type Chronic pain   Pain Onset More than a month ago   Pain Frequency Constant   Aggravating Factors  increased activity   Pain Relieving Factors rest   Effect of Pain on Daily Activities difficutly reaching overhead and performing work duties  [facility] Adult PT Treatment/Exercise - 02/07/17 0001      Shoulder Exercises: Pulleys   Flexion Other (comment)   Flexion Limitations 3 minutes   Other Pulley Exercises UBE x 5 minutes, 2.5 minutes both directions     Modalities   Modalities Electrical Stimulation;Moist Heat     Moist Heat Therapy   Number Minutes Moist Heat 15 Minutes   Moist Heat Location Shoulder     Electrical Stimulation   Electrical Stimulation Location R shoulder   Electrical Stimulation Action IFC   Electrical Stimulation Parameters 80-150 Hz x 15 minutes   Electrical Stimulation Goals Tone;Pain                PT Education - 02/07/17 1318    Education Details discussed follow up with MD, continue gentle stretching and exercises with HEP   Person(s) Educated Patient   Methods Explanation   Comprehension Verbalized  understanding             PT Long Term Goals - 02/07/17 1323      PT LONG TERM GOAL #1   Title independent with a HEP.   Time 6   Period Weeks   Status Achieved     PT LONG TERM GOAL #2   Title Active right shoulder flexion to 155 degrees so the patient can easily reach overhead   Period Weeks   Status On-going     PT LONG TERM GOAL #3   Title Active ER to 75 degrees+ to allow for easily donning/doffing of apparel   Time 6   Period Weeks   Status On-going     PT LONG TERM GOAL #4   Title Increase ROM so patient is able to reach behind back to L4 with right hand.   Time 6   Period Weeks   Status On-going     PT LONG TERM GOAL #5   Title Increase right  shoulder ER strength to a solid 4+/5 to increase stability for performance of functional activities   Time 6   Period Weeks   Status On-going     PT LONG TERM GOAL #6   Title Perform ADL's with right shoulder pain not > 3/10.   Time 6   Period Weeks   Status Partially Met               Plan - 02/07/17 1320    Clinical Impression Statement Patient arriving to therapy reporting 7/10 pain in her right shoulder and right upper trap. Pt with limited tolerance to exercises today due to pain. E-stim and heat applied. Pt reporting decreased pain  to 6/10 at end of session. Recommended pt folllw up with MD for further evaluation of her right shoulder.    Rehab Potential Excellent   PT Frequency 2x / week   PT Duration 6 weeks   PT Treatment/Interventions ADLs/Self Care Home Management;Cryotherapy;Electrical Stimulation;Moist Heat;Ultrasound;Patient/family education;Therapeutic exercise;Therapeutic activities;Manual techniques;Passive range of motion;Dry needling;Vasopneumatic Device   PT Next Visit Plan cont with POC for ROM and strength/modalities PRN   PT Home Exercise Plan UT and lev scap stretches   Consulted and Agree with Plan of Care Patient      Patient will benefit from skilled therapeutic intervention  in order to improve the following deficits and impairments:  Pain, Decreased activity tolerance, Decreased strength, Decreased range of motion  Visit Diagnosis: Chronic right shoulder pain  Muscle weakness (generalized)  Stiffness of right shoulder, not elsewhere classified     Problem List Patient  Active Problem List   Diagnosis Date Noted  . History of rotator cuff tear 11/30/2016  . Impingement syndrome of right shoulder 11/30/2016  . Exacerbation of asthma 07/18/2016  . Hypokalemia 07/18/2016  . Hypertension 07/18/2016  . Depression with anxiety 07/18/2016  . Hypothyroidism 07/18/2016  . Hyperglycemia 07/18/2016  . Acute asthma exacerbation 07/18/2016  . Surgical wound dehiscence left hip; questionable superficial infection 11/10/2015  . Left hip postoperative wound infection 11/10/2015  . Osteoarthritis of left hip 09/09/2015  . 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 Obesity (BMI 35.0-39.9 without comorbidity) 04/28/2013    Oretha Caprice, MPT 02/07/2017, 1:29 PM  [facility] [address] [place], [name], [address] Phone: [phone]   Fax:  [phone]  Name: Marilyn [name] MRN: [id number] Date of Birth: 06-22-1971

## 2017-02-13 ENCOUNTER — Ambulatory Visit (INDEPENDENT_AMBULATORY_CARE_PROVIDER_SITE_OTHER): Payer: Medicare Other

## 2017-02-13 ENCOUNTER — Encounter (INDEPENDENT_AMBULATORY_CARE_PROVIDER_SITE_OTHER): Payer: Self-pay | Admitting: Family

## 2017-02-13 ENCOUNTER — Ambulatory Visit (INDEPENDENT_AMBULATORY_CARE_PROVIDER_SITE_OTHER): Payer: Medicare Other | Admitting: Family

## 2017-02-13 DIAGNOSIS — G8929 Other chronic pain: Secondary | ICD-10-CM | POA: Diagnosis not present

## 2017-02-13 DIAGNOSIS — M542 Cervicalgia: Secondary | ICD-10-CM | POA: Diagnosis not present

## 2017-02-13 DIAGNOSIS — M25511 Pain in right shoulder: Secondary | ICD-10-CM

## 2017-02-13 MED ORDER — PREDNISONE 10 MG (21) PO TBPK: ORAL_TABLET | ORAL | 0 refills | Status: DC

## 2017-02-13 MED ORDER — OXYCODONE HCL 15 MG PO TABS: 15.0000 mg | ORAL_TABLET | Freq: Two times a day (BID) | ORAL | 0 refills | Status: DC | PRN

## 2017-02-13 NOTE — Progress Notes (Signed)
Office Visit Note   Patient: Marilyn [name]           Date of Birth: May 09, 1971           MRN: [id number] Visit Date: 02/13/2017              Requested by: Joette Catching, MD [address] [place], [place] [address] PCP: Joette Catching, MD  Chief Complaint  Patient presents with  . Right Shoulder - Pain, Follow-up      HPI: Patient is a 46 year old woman with impingement and adhesive capsulitis of both shoulders she is currently doing physical therapy at [facility] outpatient and she states she has not returned to the gym yet. She is working primarily on her right shoulder. She states the pain is worse at the end of the day does not have much pain generated today she states she still has decreased range of motion she has had no relief with subacromial injections. Of note patient states she is currently going to pain management and she is on morphine and oxycodone 15 mg.  Is currently between pain management clinics. Her current clinic no longer could see her they have placed referral to another clinic. She requests a refill of her oxycodone in the interim.  Complaining of neck pain. Points to the scapular border. Continues to have issues with range of motion which is consistent with adhesive capsulitis. Today complaining of numbness, tingling and weakness down the right arm to fingers, this has been consistent for months. Wonders if an MRI would be helpful. Requesting an x-ray today.  Has a history of 2 arthroscopies on the right shoulder as well as the left.  Assessment & Plan: Visit Diagnoses:  1. Chronic right shoulder pain   2. Cervicalgia      Plan: Recommended continue with her physical therapy continue with scapular stabilization. To discuss case with Dr. Lajoyce Corners and update patient on plan of care as to further MRI or arthroscopy. Will trial a pred taper for cervicalgia.   Follow-Up Instructions: Return in about 4 weeks (around 03/13/2017).   Ortho Exam  Patient is alert,  oriented, no adenopathy, well-dressed, normal affect, normal respiratory effort. Examination patient does have an antalgic gait. Cervical spine nontender. Full flexion and extension. These are painless. Does have pain with left rotation. Spurlings negative. Trapezius on right tender. Examination of both shoulders she has active abduction and flexion to 20. She has significant decreased internal and external rotation of only about 30 bilaterally. Patient does have pain with Neer and Hawkins impingement test. Biceps tendon nontender. Pain out of proportion to exam. Grip strength equal bilaterally.   Imaging: Xr Cervical Spine 2 Or 3 Views  Result Date: 02/13/2017 Radiographs of the cervical spine are negative. Disc spaces well preserved. No listhesis.   Xr Shoulder Right  Result Date: 02/13/2017 Radiographs of the right shoulder are negative for acute finding.   Labs: Lab Results  Component Value Date   HGBA1C 6.2 (H) 07/18/2016   HGBA1C 6.2 (H) 09/02/2015   ESRSEDRATE 122 (H) 11/11/2015   CRP 11.2 (H) 11/11/2015   REPTSTATUS 10/02/2015 FINAL 09/29/2015   CULT  09/29/2015    NO GROUP A STREP (S.PYOGENES) ISOLATED Performed at [facility]    [facility] ESCHERICHIA COLI 07/18/2012    Orders:  Orders Placed This Encounter  Procedures  . XR Shoulder Right  . XR Cervical Spine 2 or 3 views   Meds ordered this encounter  Medications  . oxyCODONE (ROXICODONE) 15 MG  immediate release tablet    Sig: Take 1 tablet (15 mg total) by mouth 2 (two) times daily as needed for pain.    Dispense:  30 tablet    Refill:  0    Do not fill until February 20, 2017.  [name] DISCONTD: predniSONE (STERAPRED UNI-PAK 21 TAB) 10 MG (21) TBPK tablet    Sig: Take 6 tabs by mouth daily  for 2 days, then 5 tabs for 2 days, then 4 tabs for 2 days, then 3 tabs for 2 days, 2 tabs for 2 days, then 1 tab by mouth daily for 2 days    Dispense:  42 tablet    Refill:  0  . DISCONTD: predniSONE (STERAPRED UNI-PAK 21  TAB) 10 MG (21) TBPK tablet    Sig: Take 6 tabs by mouth daily  for 2 days, then 5 tabs for 2 days, then 4 tabs for 2 days, then 3 tabs for 2 days, 2 tabs for 2 days, then 1 tab by mouth daily for 2 days    Dispense:  42 tablet    Refill:  0  . predniSONE (STERAPRED UNI-PAK 21 TAB) 10 MG (21) TBPK tablet    Sig: Take 6 tabs by mouth daily  for 2 days, then 5 tabs for 2, 4 tabs for 2 days, then 3 tabs for 2, 2 tabs for 2, then 1 tab daily for 2    Dispense:  42 tablet    Refill:  0     Procedures: No procedures performed  Clinical Data: No additional findings.  ROS:  All other systems negative, except as noted in the HPI. Review of Systems  Constitutional: Negative for chills and fever.  Musculoskeletal: Positive for arthralgias and neck pain. Negative for joint swelling and neck stiffness.    Objective: Vital Signs: There were no vitals taken for this visit.  Specialty Comments:  No specialty comments available.  PMFS History: Patient Active Problem List   Diagnosis Date Noted  . Chronic right shoulder pain 02/13/2017  . History of rotator cuff tear 11/30/2016  . Impingement syndrome of right shoulder 11/30/2016  . Exacerbation of asthma 07/18/2016  . Hypokalemia 07/18/2016  . Hypertension 07/18/2016  . Depression with anxiety 07/18/2016  . Hypothyroidism 07/18/2016  . Hyperglycemia 07/18/2016  . Acute asthma exacerbation 07/18/2016  . Surgical wound dehiscence left hip; questionable superficial infection 11/10/2015  . Left hip postoperative wound infection 11/10/2015  . Osteoarthritis of left hip 09/09/2015  . 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 Obesity (BMI 35.0-39.9 without comorbidity) 04/28/2013   Past Medical History:  Diagnosis Date  . Anemia    taking iron now. pt states having no current issues 09/02/2015  . Anginal pain ([facility])    pt states experiences chest wall pain pt states related to her asthma   . Anxiety    with MRI's  .  Arthritis   . Asthma   . Dizziness   . GERD (gastroesophageal reflux disease)   . Headache(784.0)    HX OF MIGRAINES  . History of bronchitis   . Hypertension   . Hypothyroidism    takes levothyroxen  . OSA on CPAP   . Shortness of breath    with exertion  . Wears glasses     Family History  Problem Relation Age of Onset  . Cancer Mother        colon  . Epilepsy Mother   . Cancer Father  prostate  . Diabetes Father   . Hypertension Father   . Hypertension Maternal Aunt   . Diabetes Maternal Aunt   . Hypertension Paternal Aunt     Past Surgical History:  Procedure Laterality Date  . CESAREAN SECTION     times 2  . CHOLECYSTECTOMY    . ENDOMETRIAL ABLATION    . INCISION AND DRAINAGE HIP Left 11/10/2015   Procedure: IRRIGATION AND DEBRIDEMENT LEFT HIP INCISION;  Surgeon: Kathryne Hitchhristopher Y Blackman, MD;  Location: [facility];  Service: Orthopedics;  Laterality: Left;  . JOINT REPLACEMENT  2011   total left knee  . KNEE ARTHROPLASTY  04/23/2012   right   . KNEE ARTHROSCOPY    . ROTATOR CUFF REPAIR     left   . SHOULDER SURGERY     right to repair ligament tear  . TOTAL HIP ARTHROPLASTY Left 09/09/2015   Procedure: LEFT TOTAL HIP ARTHROPLASTY ANTERIOR APPROACH;  Surgeon: Kathryne Hitchhristopher Y Blackman, MD;  Location[facility];  Service: Orthopedics;  Laterality: Left;  . TOTAL KNEE ARTHROPLASTY  04/23/2012   Procedure: TOTAL KNEE ARTHROPLASTY;  Surgeon: Nadara MustardMarcus V Duda, MD;  Location: [facility];  Service: Orthopedics;  Laterality: Right;  Right Total Knee Arthroplasty  . TUBAL LIGATION  1996   Social History   Occupational History  . Not on file.   Social History Main Topics  . Smoking status: Former Smoker    Packs/day: 0.50    Years: 4.00    Quit date: 09/18/1991  . Smokeless tobacco: Never Used  . Alcohol use No  . Drug use: No  . Sexual activity: Yes    Birth control/ protection: Surgical

## 2017-02-18 ENCOUNTER — Telehealth (INDEPENDENT_AMBULATORY_CARE_PROVIDER_SITE_OTHER): Payer: Self-pay | Admitting: Orthopedic Surgery

## 2017-02-18 ENCOUNTER — Ambulatory Visit: Payer: Medicare Other | Attending: Family | Admitting: Physical Therapy

## 2017-02-18 DIAGNOSIS — M6281 Muscle weakness (generalized): Secondary | ICD-10-CM | POA: Diagnosis present

## 2017-02-18 DIAGNOSIS — G8929 Other chronic pain: Secondary | ICD-10-CM

## 2017-02-18 DIAGNOSIS — M25611 Stiffness of right shoulder, not elsewhere classified: Secondary | ICD-10-CM

## 2017-02-18 DIAGNOSIS — M25511 Pain in right shoulder: Secondary | ICD-10-CM | POA: Diagnosis present

## 2017-02-18 NOTE — Telephone Encounter (Signed)
Patient called wanting to speak with you and see if she needs to set up an MRI. CB # [phone]

## 2017-02-18 NOTE — Therapy (Signed)
[facility] [place], [name], [address] Phone: [phone]   Fax:  [phone]  Physical Therapy Treatment  Patient Details  Name: Marilyn [name] MRN: [id number] Date of Birth: [birth year] Referring Provider: Dondra Prader  Encounter Date: 02/18/2017      PT End of Session - 02/18/17 1111    Visit Number 16   Number of Visits 20   Date for PT Re-Evaluation 03/26/17   Authorization Type Sent Re-cert on 3/47/42   PT Start Time 1115   PT Stop Time 1210   PT Time Calculation (min) 55 min   Activity Tolerance Patient tolerated treatment well   Behavior During Therapy [facility] for tasks assessed/performed      Past Medical History:  Diagnosis Date  . Anemia    taking iron now. pt states having no current issues 09/02/2015  . Anginal pain ([place])    pt states experiences chest wall pain pt states related to her asthma   . Anxiety    with MRI's  . Arthritis   . Asthma   . Dizziness   . GERD (gastroesophageal reflux disease)   . Headache(784.0)    HX OF MIGRAINES  . History of bronchitis   . Hypertension   . Hypothyroidism    takes levothyroxen  . OSA on CPAP   . Shortness of breath    with exertion  . Wears glasses     Past Surgical History:  Procedure Laterality Date  . CESAREAN SECTION     times 2  . CHOLECYSTECTOMY    . ENDOMETRIAL ABLATION    . INCISION AND DRAINAGE HIP Left 11/10/2015   Procedure: IRRIGATION AND DEBRIDEMENT LEFT HIP INCISION;  Surgeon: Mcarthur Rossetti, MD;  Location: [place];  Service: Orthopedics;  Laterality: Left;  . JOINT REPLACEMENT  2011   total left knee  . KNEE ARTHROPLASTY  04/23/2012   right   . KNEE ARTHROSCOPY    . ROTATOR CUFF REPAIR     left   . SHOULDER SURGERY     right to repair ligament tear  . TOTAL HIP ARTHROPLASTY Left 09/09/2015   Procedure: LEFT TOTAL HIP ARTHROPLASTY ANTERIOR APPROACH;  Surgeon: Mcarthur Rossetti, MD;  [facility];  Service:  Orthopedics;  Laterality: Left;  . TOTAL KNEE ARTHROPLASTY  04/23/2012   Procedure: TOTAL KNEE ARTHROPLASTY;  Surgeon: Newt Minion, MD;  Location: [place];  Service: Orthopedics;  Laterality: Right;  Right Total Knee Arthroplasty  . TUBAL LIGATION  1996    There were no vitals filed for this visit.      Subjective Assessment - 02/18/17 1124    Subjective Patient states she saw MD and xrays were taken of her neck and that PA would like her to have an MRI. Her pain is much better today reporting no pain.   Pertinent History Previous right shoulder surgery.   Limitations Sitting;Standing   How long can you sit comfortably? 20 minutes.   How long can you stand comfortably? 15-20 minutes.   Patient Stated Goals Use right UE without.   Currently in Pain? No/denies            [facility] PT Assessment - 02/18/17 0001      ROM / Strength   AROM / PROM / Strength Strength     AROM   AROM Assessment Site Shoulder   Right/Left Shoulder Right     Strength   Strength Assessment Site Shoulder   Right/Left Shoulder Right  Right Shoulder Flexion 4+/5   Right Shoulder Extension 5/5   Right Shoulder ABduction 4-/5   Right Shoulder Internal Rotation 5/5   Right Shoulder External Rotation 5/5                     [facility] Adult PT Treatment/Exercise - 02/18/17 0001      Shoulder Exercises: Supine   Horizontal ABduction Limitations atttempted but too painful   Other Supine Exercises PNF D1/D2 x 20 reps no weight  unable to do in stdg without pain or compensation     Shoulder Exercises: Standing   Row Strengthening;Both;10 reps;20 reps;Theraband   Theraband Level (Shoulder Row) Level 3 (Green)   Other Standing Exercises flex/ABD 1# x 20 ea     Shoulder Exercises: Pulleys   Flexion Other (comment);3 minutes   Other Pulley Exercises UBE x 5 minutes, 2.5 minutes both directions 120 RPM     Shoulder Exercises: ROM/Strengthening   Rhythmic Stabilization, Seated rhythmic stab; pain  with resisted flex and abd   Other ROM/Strengthening Exercises supine cane for flex x 20 and ER with approx 40 deg ABD x 20     Modalities   Modalities Electrical Stimulation;Moist Heat     Moist Heat Therapy   Number Minutes Moist Heat 15 Minutes   Moist Heat Location Shoulder     Electrical Stimulation   Electrical Stimulation Location R shoulder   Electrical Stimulation Action premod   Electrical Stimulation Parameters 80-150 Hz x 15 min   Electrical Stimulation Goals Pain                     PT Long Term Goals - 02/07/17 1323      PT LONG TERM GOAL #1   Title independent with a HEP.   Time 6   Period Weeks   Status Achieved     PT LONG TERM GOAL #2   Title Active right shoulder flexion to 155 degrees so the patient can easily reach overhead   Period Weeks   Status On-going     PT LONG TERM GOAL #3   Title Active ER to 75 degrees+ to allow for easily donning/doffing of apparel   Time 6   Period Weeks   Status On-going     PT LONG TERM GOAL #4   Title Increase ROM so patient is able to reach behind back to L4 with right hand.   Time 6   Period Weeks   Status On-going     PT LONG TERM GOAL #5   Title Increase right  shoulder ER strength to a solid 4+/5 to increase stability for performance of functional activities   Time 6   Period Weeks   Status On-going     PT LONG TERM GOAL #6   Title Perform ADL's with right shoulder pain not > 3/10.   Time 6   Period Weeks   Status Partially Met               Plan - 02/18/17 1202    Clinical Impression Statement Patient did well with exercises in pain free range. She has normal IR/ER strength, but is limited with flex and ABD due to pain. She had increased pain at end of treatment so estim and heat applied with normal response.   PT Treatment/Interventions ADLs/Self Care Home Management;Cryotherapy;Electrical Stimulation;Moist Heat;Ultrasound;Patient/family education;Therapeutic exercise;Therapeutic  activities;Manual techniques;Passive range of motion;Dry needling;Vasopneumatic Device   PT Next Visit Plan cont with POC for ROM  and strength/modalities PRN      Patient will benefit from skilled therapeutic intervention in order to improve the following deficits and impairments:  Pain, Decreased activity tolerance, Decreased strength, Decreased range of motion  Visit Diagnosis: Chronic right shoulder pain  Muscle weakness (generalized)  Stiffness of right shoulder, not elsewhere classified     Problem List Patient Active Problem List   Diagnosis Date Noted  . Chronic right shoulder pain 02/13/2017  . History of rotator cuff tear 11/30/2016  . Impingement syndrome of right shoulder 11/30/2016  . Exacerbation of asthma 07/18/2016  . Hypokalemia 07/18/2016  . Hypertension 07/18/2016  . Depression with anxiety 07/18/2016  . Hypothyroidism 07/18/2016  . Hyperglycemia 07/18/2016  . Acute asthma exacerbation 07/18/2016  . Surgical wound dehiscence left hip; questionable superficial infection 11/10/2015  . Left hip postoperative wound infection 11/10/2015  . Osteoarthritis of left hip 09/09/2015  . 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 Obesity (BMI 35.0-39.9 without comorbidity) 04/28/2013   Madelyn Flavors PT 02/18/2017, 12:06 PM  [facility] [address] [place], [name], [address] Phone: [phone]   Fax:  [phone]  Name: Marilyn [name] MRN: [id number] Date of Birth: 1970-11-03

## 2017-02-19 NOTE — Telephone Encounter (Signed)
I called and advised her patient MRI is unnecessary. She is wanting to know if arthroscopy is next step, it was mentioned. Please advise.

## 2017-02-19 NOTE — Telephone Encounter (Signed)
U call we could proceed with arthroscopy of her shoulder, but the chance of improvement is about 50-50.

## 2017-02-19 NOTE — Telephone Encounter (Signed)
Per Dr. Lajoyce Cornersuda she does not need another MRI. That she has had 2 and has had 2 scopes and that further imaging is not needed at this time.

## 2017-02-20 ENCOUNTER — Ambulatory Visit: Payer: Medicare Other | Admitting: Physical Therapy

## 2017-02-20 ENCOUNTER — Encounter: Payer: Self-pay | Admitting: Physical Therapy

## 2017-02-20 DIAGNOSIS — M6281 Muscle weakness (generalized): Secondary | ICD-10-CM

## 2017-02-20 DIAGNOSIS — M25511 Pain in right shoulder: Principal | ICD-10-CM

## 2017-02-20 DIAGNOSIS — G8929 Other chronic pain: Secondary | ICD-10-CM

## 2017-02-20 DIAGNOSIS — M25611 Stiffness of right shoulder, not elsewhere classified: Secondary | ICD-10-CM

## 2017-02-20 NOTE — Therapy (Addendum)
[name], [name], [address] Phone: [phone]   Fax:  [phone]  Physical Therapy Treatment  Patient Details  Name: Marilyn [name] MRN: [id number] Date of Birth: 12-20-1970 Referring Provider: Dondra Prader  Encounter Date: 02/20/2017      PT End of Session - 02/20/17 1130    Visit Number 17   Number of Visits 20   Date for PT Re-Evaluation 03/26/17   Authorization Type Sent Re-cert on 5/00/93   PT Start Time 1124   PT Stop Time 1209   PT Time Calculation (min) 45 min   Activity Tolerance Patient tolerated treatment well   Behavior During Therapy [facility] for tasks assessed/performed      Past Medical History:  Diagnosis Date  . Anemia    taking iron now. pt states having no current issues 09/02/2015  . Anginal pain ([place])    pt states experiences chest wall pain pt states related to her asthma   . Anxiety    with MRI's  . Arthritis   . Asthma   . Dizziness   . GERD (gastroesophageal reflux disease)   . Headache(784.0)    HX OF MIGRAINES  . History of bronchitis   . Hypertension   . Hypothyroidism    takes levothyroxen  . OSA on CPAP   . Shortness of breath    with exertion  . Wears glasses     Past Surgical History:  Procedure Laterality Date  . CESAREAN SECTION     times 2  . CHOLECYSTECTOMY    . ENDOMETRIAL ABLATION    . INCISION AND DRAINAGE HIP Left 11/10/2015   Procedure: IRRIGATION AND DEBRIDEMENT LEFT HIP INCISION;  Surgeon: Mcarthur Rossetti, MD;  Location: [facility];  Service: Orthopedics;  Laterality: Left;  . JOINT REPLACEMENT  2011   total left knee  . KNEE ARTHROPLASTY  04/23/2012   right   . KNEE ARTHROSCOPY    . ROTATOR CUFF REPAIR     left   . SHOULDER SURGERY     right to repair ligament tear  . TOTAL HIP ARTHROPLASTY Left 09/09/2015   Procedure: LEFT TOTAL HIP ARTHROPLASTY ANTERIOR APPROACH;  Surgeon: Mcarthur Rossetti, MD;  Location: [facility];  Service:  Orthopedics;  Laterality: Left;  . TOTAL KNEE ARTHROPLASTY  04/23/2012   Procedure: TOTAL KNEE ARTHROPLASTY;  Surgeon: Newt Minion, MD;  Location: [place];  Service: Orthopedics;  Laterality: Right;  Right Total Knee Arthroplasty  . TUBAL LIGATION  1996    There were no vitals filed for this visit.      Subjective Assessment - 02/20/17 1127    Subjective Reports that she recieved a call that they are going to do an MRI soon. Reports that her shoulder feels about the same.    Pertinent History Previous right shoulder surgery.   Limitations Sitting;Standing   How long can you sit comfortably? 20 minutes.   How long can you stand comfortably? 15-20 minutes.   Patient Stated Goals Use right UE without.   Currently in Pain? Yes   Pain Score 3    Pain Location Shoulder   Pain Orientation Right   Pain Descriptors / Indicators Discomfort   Pain Type Chronic pain   Pain Onset More than a month ago            [facility] PT Assessment - 02/20/17 0001      Assessment   Medical Diagnosis right shoulder impingment   Next MD  Visit None     Precautions   Precautions None     Restrictions   Weight Bearing Restrictions No                     [facility] Adult PT Treatment/Exercise - 02/20/17 0001      Shoulder Exercises: Standing   Flexion Strengthening;Both;20 reps;Weights   Shoulder Flexion Weight (lbs) 1   ABduction Strengthening;Both;20 reps;Weights   Shoulder ABduction Weight (lbs) 1   Row Strengthening;Left;Theraband   Theraband Level (Shoulder Row) Level 3 (Green)   Row Limitations 3x10 reps   Other Standing Exercises L shoulder D2 and D1 AROM  x20 reps   Other Standing Exercises Wall slides x30 reps     Shoulder Exercises: Pulleys   Flexion Other (comment)  x5 min     Shoulder Exercises: ROM/Strengthening   UBE (Upper Arm Bike) 120 RPM x5 min     Modalities   Modalities Electrical Stimulation;Moist Heat     Moist Heat Therapy   Number Minutes Moist Heat 10  Minutes   Moist Heat Location Shoulder     Electrical Stimulation   Electrical Stimulation Location R shoulder   Electrical Stimulation Action Pre-mod   Electrical Stimulation Parameters 80-150 hz x10 min   Electrical Stimulation Goals Pain                     PT Long Term Goals - 02/07/17 1323      PT LONG TERM GOAL #1   Title independent with a HEP.   Time 6   Period Weeks   Status Achieved     PT LONG TERM GOAL #2   Title Active right shoulder flexion to 155 degrees so the patient can easily reach overhead   Period Weeks   Status On-going     PT LONG TERM GOAL #3   Title Active ER to 75 degrees+ to allow for easily donning/doffing of apparel   Time 6   Period Weeks   Status On-going     PT LONG TERM GOAL #4   Title Increase ROM so patient is able to reach behind back to L4 with right hand.   Time 6   Period Weeks   Status On-going     PT LONG TERM GOAL #5   Title Increase right  shoulder ER strength to a solid 4+/5 to increase stability for performance of functional activities   Time 6   Period Weeks   Status On-going     PT LONG TERM GOAL #6   Title Perform ADL's with right shoulder pain not > 3/10.   Time 6   Period Weeks   Status Partially Met               Plan - 02/20/17 1549    Clinical Impression Statement Patient tolerated today's treatment well with only reports of weakness especially with shoulder flexion and abduction with 1# weights. Intermittant facial grimacing observed with D1 and D2 strengthening exercises. Normal modalities response noted following removal of the modalities.   Rehab Potential Excellent   PT Frequency 2x / week   PT Duration 6 weeks   PT Treatment/Interventions ADLs/Self Care Home Management;Cryotherapy;Electrical Stimulation;Moist Heat;Ultrasound;Patient/family education;Therapeutic exercise;Therapeutic activities;Manual techniques;Passive range of motion;Dry needling;Vasopneumatic Device   PT Next Visit  Plan cont with POC for ROM and strength/modalities PRN   PT Home Exercise Plan UT and lev scap stretches   Consulted and Agree with Plan of Care Patient  Patient will benefit from skilled therapeutic intervention in order to improve the following deficits and impairments:  Pain, Decreased activity tolerance, Decreased strength, Decreased range of motion  Visit Diagnosis: Chronic right shoulder pain  Muscle weakness (generalized)  Stiffness of right shoulder, not elsewhere classified     Problem List Patient Active Problem List   Diagnosis Date Noted  . Chronic right shoulder pain 02/13/2017  . History of rotator cuff tear 11/30/2016  . Impingement syndrome of right shoulder 11/30/2016  . Exacerbation of asthma 07/18/2016  . Hypokalemia 07/18/2016  . Hypertension 07/18/2016  . Depression with anxiety 07/18/2016  . Hypothyroidism 07/18/2016  . Hyperglycemia 07/18/2016  . Acute asthma exacerbation 07/18/2016  . Surgical wound dehiscence left hip; questionable superficial infection 11/10/2015  . Left hip postoperative wound infection 11/10/2015  . Osteoarthritis of left hip 09/09/2015  . 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 Obesity (BMI 35.0-39.9 without comorbidity) 04/28/2013    Wynelle Fanny, PTA 02/20/2017, 4:04 PM  [facility] [address] [place], [name], [address] Phone: [phone]   Fax:  [phone]  Name: Marilyn [name] MRN: [id number] Date of Birth: 01-30-1971  PHYSICAL THERAPY DISCHARGE SUMMARY  Visits from Start of Care: 17.  Current functional level related to goals / functional outcomes: See above.   Remaining deficits: Continued right shoulder pain and loss of motion.   Education / Equipment: HEP. Plan: Patient agrees to discharge.  Patient goals were partially met. Patient is being discharged due to not returning since the last visit.  ?????         Mali  Applegate MPT

## 2017-02-20 NOTE — Telephone Encounter (Signed)
Per Dr. Lajoyce Cornersuda that patient's best option would to continue with physical therapy. Arthroscopy surgery may not give relief if any. Further surgery would cause muscle to weaken more.

## 2017-02-21 ENCOUNTER — Encounter: Payer: Medicare Other | Admitting: *Deleted

## 2017-02-22 NOTE — Telephone Encounter (Signed)
I called and left voicemail to patient to advise of message below.

## 2017-03-13 ENCOUNTER — Ambulatory Visit (INDEPENDENT_AMBULATORY_CARE_PROVIDER_SITE_OTHER): Payer: Medicare Other | Admitting: Family

## 2017-03-27 ENCOUNTER — Ambulatory Visit (INDEPENDENT_AMBULATORY_CARE_PROVIDER_SITE_OTHER): Payer: Medicare Other | Admitting: Orthopaedic Surgery

## 2017-03-27 DIAGNOSIS — M549 Dorsalgia, unspecified: Secondary | ICD-10-CM | POA: Insufficient documentation

## 2017-03-27 DIAGNOSIS — M5441 Lumbago with sciatica, right side: Secondary | ICD-10-CM | POA: Diagnosis not present

## 2017-03-27 MED ORDER — METHYLPREDNISOLONE 4 MG PO TABS: ORAL_TABLET | ORAL | 0 refills | Status: DC

## 2017-03-27 MED ORDER — GABAPENTIN 300 MG PO CAPS: 300.0000 mg | ORAL_CAPSULE | Freq: Every day | ORAL | 0 refills | Status: DC

## 2017-03-27 NOTE — Progress Notes (Signed)
The patient comes in today with a 2 week history of low back pain that radiates into her left backside and sciatic area. She denies any injuries that she is aware of. She denies any hip pain in the groin today. It hurts mainly with activities. It does not radiate down into her foot. She is well-known patient her office. She's had a left hip replacement and bilateral knee replacements. She is also morbidly obese as well.  She denies any change in bowel or bladder function or weakness in her legs.  On examination she appears comfortable. I can easily put her right hip the range of motion was not much pain. Her pain seems to be more sciatic region. She's got good strength in her lower extremity and a numbness in her foot on the right side.  Previous plain films her lumbar spine show well maintained disc heights and alignment.  I am going to treat this as acute sciatica. We will try a Six-day steroid taper. She is already on chronic narcotic pain medications and does take anti-inflammatories. I counseled her about weight loss. We will have her try back extension exercises well. We'll also send in some Neurontin to take at night.

## 2017-04-03 ENCOUNTER — Telehealth (INDEPENDENT_AMBULATORY_CARE_PROVIDER_SITE_OTHER): Payer: Self-pay | Admitting: Orthopaedic Surgery

## 2017-04-03 DIAGNOSIS — G8929 Other chronic pain: Secondary | ICD-10-CM

## 2017-04-03 DIAGNOSIS — M5441 Lumbago with sciatica, right side: Principal | ICD-10-CM

## 2017-04-03 MED ORDER — DIAZEPAM 5 MG PO TABS: ORAL_TABLET | ORAL | 0 refills | Status: DC

## 2017-04-03 NOTE — Telephone Encounter (Signed)
We can order a MRI of her lumbar spine to rule out a herniated disc as the source of her pain.  Can still try intermittent ice and heat as well as over-the-counter anti-inflammatories.

## 2017-04-03 NOTE — Telephone Encounter (Signed)
I think patient means she finished the MDP and still has pain. Please advise

## 2017-04-03 NOTE — Telephone Encounter (Signed)
Patient called left voicemail message stating she finished the z-pack but still having a lot of pain. The number to contact patient is [phone]

## 2017-04-03 NOTE — Telephone Encounter (Signed)
IC patient and advised, she needs open scanner and valium for claustro.  Order entered for [employer], and I have sent valium to pharm

## 2017-04-19 ENCOUNTER — Ambulatory Visit
Admission: RE | Admit: 2017-04-19 | Discharge: 2017-04-19 | Disposition: A | Discharge status: Home / Self Care | Payer: Medicare Other | Source: Ambulatory Visit | Attending: Orthopaedic Surgery | Admitting: Orthopaedic Surgery

## 2017-04-19 DIAGNOSIS — M5441 Lumbago with sciatica, right side: Principal | ICD-10-CM

## 2017-04-19 DIAGNOSIS — G8929 Other chronic pain: Secondary | ICD-10-CM

## 2017-04-23 ENCOUNTER — Telehealth (INDEPENDENT_AMBULATORY_CARE_PROVIDER_SITE_OTHER): Payer: Self-pay | Admitting: Orthopaedic Surgery

## 2017-04-23 NOTE — Telephone Encounter (Signed)
Needs to come in to go over the MRI results and we can show the images and discuss with them what we need to do

## 2017-04-23 NOTE — Telephone Encounter (Signed)
Can we please make an appt for her  He really likes to see his patients in office not over phone, thanks [name]

## 2017-04-23 NOTE — Telephone Encounter (Signed)
Patient called asked if she can get a call back concerning the results of her MRI. The number to contact patient is [phone]

## 2017-04-23 NOTE — Telephone Encounter (Signed)
Please advise 

## 2017-04-24 NOTE — Telephone Encounter (Signed)
Patient scheduled 05/06/17 at 2:45pm with Dr. Magnus IvanBlackman for MRI review

## 2017-05-01 ENCOUNTER — Telehealth (INDEPENDENT_AMBULATORY_CARE_PROVIDER_SITE_OTHER): Payer: Self-pay

## 2017-05-01 ENCOUNTER — Ambulatory Visit
Admission: RE | Admit: 2017-05-01 | Discharge: 2017-05-01 | Disposition: A | Discharge status: Home / Self Care | Payer: Medicare Other | Source: Ambulatory Visit | Attending: Adult Health Nurse Practitioner | Admitting: Adult Health Nurse Practitioner

## 2017-05-01 DIAGNOSIS — Z1231 Encounter for screening mammogram for malignant neoplasm of breast: Secondary | ICD-10-CM

## 2017-05-01 MED ORDER — CYCLOBENZAPRINE HCL 10 MG PO TABS: 10.0000 mg | ORAL_TABLET | Freq: Two times a day (BID) | ORAL | 0 refills | Status: DC | PRN

## 2017-05-01 MED ORDER — ACETAMINOPHEN-CODEINE #3 300-30 MG PO TABS: 1.0000 | ORAL_TABLET | Freq: Three times a day (TID) | ORAL | 0 refills | Status: DC | PRN

## 2017-05-01 NOTE — Telephone Encounter (Signed)
Patient would like a call from you.  Stated that it's urgent and it's concerning her back.  CB# is [phone].

## 2017-05-01 NOTE — Telephone Encounter (Signed)
Patient had MRI on 04/19/17. She is wanting results but said she cannot get in to see you until Monday. She wanted to know if she could have something for pain in the meantime.

## 2017-05-01 NOTE — Addendum Note (Signed)
Addended byPrescott Parma: Ilithyia Titzer on: 05/01/2017 03:55 PM   Modules accepted: Orders

## 2017-05-01 NOTE — Telephone Encounter (Signed)
Please send and Tylenol 3, one to 2 every 8 hours as needed for pain #60 with no refills  as well as Flexeril 10 mg 1 by mouth twice a day for pain and spasms as needed #60 with no refills. Thanks

## 2017-05-01 NOTE — Telephone Encounter (Signed)
Called to pharmacy. Patient aware. 

## 2017-05-02 ENCOUNTER — Telehealth (INDEPENDENT_AMBULATORY_CARE_PROVIDER_SITE_OTHER): Payer: Self-pay | Admitting: Orthopaedic Surgery

## 2017-05-02 ENCOUNTER — Other Ambulatory Visit (INDEPENDENT_AMBULATORY_CARE_PROVIDER_SITE_OTHER): Payer: Self-pay

## 2017-05-02 ENCOUNTER — Other Ambulatory Visit (INDEPENDENT_AMBULATORY_CARE_PROVIDER_SITE_OTHER): Payer: Self-pay | Admitting: Orthopaedic Surgery

## 2017-05-02 MED ORDER — TRAMADOL HCL 50 MG PO TABS: 50.0000 mg | ORAL_TABLET | Freq: Four times a day (QID) | ORAL | 0 refills | Status: DC | PRN

## 2017-05-02 NOTE — Telephone Encounter (Signed)
Try tramadol 50 mg, 1-2 every 6-8 hours as needed, #60, no refills and flexeril 10 mg, 1 every 8 hours as needed, #60

## 2017-05-02 NOTE — Telephone Encounter (Signed)
Patient called advised the pharmacy did not fill the Rx because she us allergic to codeine.  Patient said she do not have anything to take for the pain. Patient asked for a call back as soon as possible.  The number to contact patient is [phone]

## 2017-05-02 NOTE — Telephone Encounter (Signed)
Please advise 

## 2017-05-02 NOTE — Telephone Encounter (Signed)
Called into pharmacy, left message for patient letting her know I did so

## 2017-05-06 ENCOUNTER — Ambulatory Visit (INDEPENDENT_AMBULATORY_CARE_PROVIDER_SITE_OTHER): Payer: Medicare Other | Admitting: Orthopaedic Surgery

## 2017-05-06 DIAGNOSIS — M5441 Lumbago with sciatica, right side: Secondary | ICD-10-CM | POA: Diagnosis not present

## 2017-05-06 NOTE — Progress Notes (Signed)
Marilyn [name] is a very pleasant morbidly obese female who comes in for follow-up after MRI of her lumbar spine. She was getting radicular symptoms from her low back going down her right backside to behind her knee and down her foot. She is radiating to the groin as well. She's had a left total hip arthroplasty bilateral knee replacements as well. Due to to the continued radicular symptoms and the failure of conservative treatment options we obtain an MRI to better evaluate her lumbar spine.  Her symptoms are still continued. She does have some pain in the groin with rotation of her hip but it is only minimal. She does have pain at the sciatic nerve region no weakness in her leg but numbness in the L4-5 distribution. She has significant truncal obesity obesity around her legs as well.  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  I share her the findings and MRI. I do most importantly feel that weight loss would help her the most and we even talked about this in detail. Certainly it is worth trying an epidural steroid injection talked her about this as well. She will think about this and if she decides to have this and see if Dr. Alvester Morin would be able to provide this at the right side at the L4-L5 area.

## 2017-05-13 ENCOUNTER — Telehealth (INDEPENDENT_AMBULATORY_CARE_PROVIDER_SITE_OTHER): Payer: Self-pay | Admitting: Physical Medicine and Rehabilitation

## 2017-05-13 ENCOUNTER — Other Ambulatory Visit (INDEPENDENT_AMBULATORY_CARE_PROVIDER_SITE_OTHER): Payer: Self-pay

## 2017-05-13 DIAGNOSIS — Z7689 Persons encountering health services in other specified circumstances: Secondary | ICD-10-CM

## 2017-05-13 NOTE — Telephone Encounter (Signed)
I looked back and his note said that she would call if she decided to wanted to proceed with injection. I put order in for you.

## 2017-05-29 ENCOUNTER — Encounter (INDEPENDENT_AMBULATORY_CARE_PROVIDER_SITE_OTHER): Payer: Self-pay | Admitting: Physical Medicine and Rehabilitation

## 2017-05-29 ENCOUNTER — Ambulatory Visit (INDEPENDENT_AMBULATORY_CARE_PROVIDER_SITE_OTHER): Payer: Medicare Other | Admitting: Physical Medicine and Rehabilitation

## 2017-05-29 VITALS — BP 173/104 | HR 81

## 2017-05-29 DIAGNOSIS — E669 Obesity, unspecified: Secondary | ICD-10-CM

## 2017-05-29 DIAGNOSIS — M25551 Pain in right hip: Secondary | ICD-10-CM

## 2017-05-29 DIAGNOSIS — M5416 Radiculopathy, lumbar region: Secondary | ICD-10-CM

## 2017-05-29 DIAGNOSIS — G8929 Other chronic pain: Secondary | ICD-10-CM

## 2017-05-29 DIAGNOSIS — Z96642 Presence of left artificial hip joint: Secondary | ICD-10-CM

## 2017-05-29 DIAGNOSIS — M47816 Spondylosis without myelopathy or radiculopathy, lumbar region: Secondary | ICD-10-CM

## 2017-05-29 DIAGNOSIS — M5441 Lumbago with sciatica, right side: Secondary | ICD-10-CM | POA: Diagnosis not present

## 2017-05-29 NOTE — Progress Notes (Deleted)
Lower back pain. Burning pain down side of right leg/ hip area. Pain is constant and does not seem to worsen with position/ activity. Also has right groin pain.

## 2017-06-06 ENCOUNTER — Encounter (INDEPENDENT_AMBULATORY_CARE_PROVIDER_SITE_OTHER): Payer: Self-pay | Admitting: Physical Medicine and Rehabilitation

## 2017-06-06 NOTE — Progress Notes (Signed)
Marilyn [name] - 46 y.o. female MRN [id number]  Date of birth: Mar 26, 1971  Office Visit Note: Visit Date: 05/29/2017 PCP: Joette Catching, MD Referred by: Joette Catching, MD  Subjective: Chief Complaint  Patient presents with  . Lower Back - Pain   HPI: Mrs. Guinn is a 46 year old female with prior left total hip arthroplasty in who is followed by Dr. Magnus Ivan in our office. In July she had acute on chronic right lower back pain referring into the hip and leg and more of a radicular-type fashion. Dr. Magnus Ivan evaluator the time and did not feel like this is coming from any hip arthritis on the right. She had good range of motion of the right hip without pain. She did have an MRI of the lumbar spine which is again reviewed below and reviewed with the patient. Dr. Magnus Ivan felt like there may be some treatment possible with interventional spine procedure. The patient at that time really was very anxious about injections into her spine and really did not decide at that time to complete any evaluation for this. She comes in today for evaluation for potential interventional spine procedure. I did go over her findings of the MRI today we spoke at length. I spoke for more than 50% of the time of the visit which was 30 minutes on the lumbar spine pathology as well as hip pathology in symptoms. She again reports constant pain that is unremitting. This pain is across the lower back but into the right hip. She gets occasional referral to the groin but pain down the posterior lateral leg and more of an L5 distribution. She has not had focal weakness but feels weak in general. She is obese. She does try to exercise when she can. She's not had any focal weakness or trauma. She's had no bowel or bladder changes.    Review of Systems  Constitutional: Negative for chills, fever, malaise/fatigue and weight loss.  HENT: Negative for hearing loss and sinus pain.   Eyes: Negative for blurred vision, double  vision and photophobia.  Respiratory: Negative for cough and shortness of breath.   Cardiovascular: Negative for chest pain, palpitations and leg swelling.  Gastrointestinal: Negative for abdominal pain, nausea and vomiting.  Genitourinary: Negative for flank pain.  Musculoskeletal: Positive for back pain and joint pain. Negative for myalgias.  Skin: Negative for itching and rash.  Neurological: Negative for tremors, focal weakness and weakness.  Endo/Heme/Allergies: Negative.   Psychiatric/Behavioral: Negative for depression.  All other systems reviewed and are negative.  Otherwise per HPI.  Assessment & Plan: Visit Diagnoses:  1. Chronic bilateral low back pain with right-sided sciatica   2. Spondylosis without myelopathy or radiculopathy, lumbar region   3. Pain in right hip   4. Obesity (BMI 35.0-39.9 without comorbidity)   5. Lumbar radiculopathy   6. History of total left hip arthroplasty     Plan: Findings:  Chronic severe worsening at times low back pain referral on the right hip and leg which is somewhat radicular. MRI evidence of significant facet arthropathy at L4-5 with facet joint effusions. There is right foraminal narrowing as well which appears to be worse than prior images. There is moderate foraminal narrowing on the left. L5-S1 shows extremely bad facet arthritis as well as severe foraminal narrowing worse on the right. The lumbar spine does seem to be an issue with her. We discussed injections in terms of facet joint injections for back pain and epidural injections for radicular-type pain. Transforaminal approach  would be somewhat difficult given her body habitus but probably doable. We discussed the risk and benefits and medications etc. Again we talked at length about this. We also discussed at length weight loss and diet and exercise. She is unsure she wants to complete spine injections at this point as she is very anxious. She will let us know if she decides to proceed.  Otherwise she'll follow-up with Dr. Magnus Ivan.    Meds & Orders: No orders of the defined types were placed in this encounter.  No orders of the defined types were placed in this encounter.   Follow-up: Return if symptoms worsen or fail to improve.   Procedures: No procedures performed  No notes on file   Clinical History: MRI LUMBAR SPINE WITHOUT CONTRAST  TECHNIQUE: Multiplanar, multisequence MR imaging of the lumbar spine was performed. No intravenous contrast was administered.  COMPARISON:  Plain films lumbar spine 11/16/2016. MRI lumbar spine 03/17/2014. This examination is incomplete. No axial imaging is available.  FINDINGS: Segmentation:  Standard.  Alignment:  Maintained.  Vertebrae:  No fracture or worrisome lesion.  Conus medullaris: Extends to the L1 level and appears normal.  Paraspinal and other soft tissues: Negative.  Disc levels:  T11-12 is imaged in the sagittal plane only. Shallow central protrusion at this level without stenosis is identified.  T12-L1:  Negative.  L1-2:  Negative.  L2-3:  Negative.  L3-4: Mild facet degenerative change. There is some ligamentum flavum thickening. No disc bulge or protrusion. The central canal and foramina are open.  L4-5: Bilateral facet degenerative change with associated effusions. There is a shallow disc bulge without central canal stenosis. Moderately severe right foraminal narrowing appears worse than on the prior examination. There is moderate left foraminal narrowing.  L5-S1: Advanced bilateral facet degenerative disease. Shallow disc bulge without central canal stenosis. Moderate to moderately severe foraminal narrowing at this level is worse on the right and unchanged in appearance.  IMPRESSION: Spondylosis appearing worst at L4-5 where there is moderately severe right foraminal narrowing, worse than on the prior examination. Facet arthropathy and new bilateral facet joint  effusions are present this level. The central canal is open.  Advanced bilateral facet degenerative disease L5-S1. Moderate to moderately severe bilateral foraminal narrowing at this level is unchanged.   Electronically Signed   By: Drusilla Kanner M.D.   On: 04/19/2017 19:13  She reports that she quit smoking about 25 years ago. She has a 2.00 pack-year smoking history. She has never used smokeless tobacco.   Recent Labs  07/18/16 0312  HGBA1C 6.2*    Objective:  VS:  HT:    WT:   BMI:     BP:(!) 173/104  HR:81bpm  TEMP: ( )  RESP:  Physical Exam  Constitutional: She is oriented to person, place, and time. She appears well-developed and well-nourished. No distress.  Obese  HENT:  Head: Normocephalic and atraumatic.  Nose: Nose normal.  Mouth/Throat: Oropharynx is clear and moist.  Eyes: Pupils are equal, round, and reactive to light. Conjunctivae are normal.  Neck: Normal range of motion. Neck supple.  Cardiovascular: Regular rhythm and intact distal pulses.   Pulmonary/Chest: Effort normal. No respiratory distress.  Abdominal: She exhibits no distension. There is no guarding.  Musculoskeletal:  Patient ambulates without aid. She has pain with extension of the lumbar spine. No real pain over the greater trochanters. No pain with right hip rotation. She has good distal strength without clonus.  Neurological: She is alert and oriented to  person, place, and time. She exhibits normal muscle tone. Coordination normal.  Skin: Skin is warm. No rash noted. No erythema.  Psychiatric: She has a normal mood and affect. Her behavior is normal.  Nursing note and vitals reviewed.   Ortho Exam Imaging: No results found.  Past Medical/Family/Surgical/Social History: Medications & Allergies reviewed per EMR Patient Active Problem List   Diagnosis Date Noted  . Acute right-sided low back pain with right-sided sciatica 03/27/2017  . Chronic right shoulder pain 02/13/2017  .  History of rotator cuff tear 11/30/2016  . Impingement syndrome of right shoulder 11/30/2016  . Exacerbation of asthma 07/18/2016  . Hypokalemia 07/18/2016  . Hypertension 07/18/2016  . Depression with anxiety 07/18/2016  . Hypothyroidism 07/18/2016  . Hyperglycemia 07/18/2016  . Acute asthma exacerbation 07/18/2016  . Surgical wound dehiscence left hip; questionable superficial infection 11/10/2015  . Left hip postoperative wound infection 11/10/2015  . Osteoarthritis of left hip 09/09/2015  . 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 Obesity (BMI 35.0-39.9 without comorbidity) 04/28/2013   Past Medical History:  Diagnosis Date  . Anemia    taking iron now. pt states having no current issues 09/02/2015  . Anginal pain (HCC)    pt states experiences chest wall pain pt states related to her asthma   . Anxiety    with MRI's  . Arthritis   . Asthma   . Dizziness   . GERD (gastroesophageal reflux disease)   . Headache(784.0)    HX OF MIGRAINES  . History of bronchitis   . Hypertension   . Hypothyroidism    takes levothyroxen  . OSA on CPAP   . Shortness of breath    with exertion  . Wears glasses    Family History  Problem Relation Age of Onset  . Cancer Mother        colon  . Epilepsy Mother   . Cancer Father        prostate  . Diabetes Father   . Hypertension Father   . Hypertension Maternal Aunt   . Diabetes Maternal Aunt   . Hypertension Paternal Aunt    Past Surgical History:  Procedure Laterality Date  . CESAREAN SECTION     times 2  . CHOLECYSTECTOMY    . ENDOMETRIAL ABLATION    . INCISION AND DRAINAGE HIP Left 11/10/2015   Procedure: IRRIGATION AND DEBRIDEMENT LEFT HIP INCISION;  Surgeon: Kathryne Hitch, MD;  Location: [facility] OR;  Service: Orthopedics;  Laterality: Left;  . JOINT REPLACEMENT  2011   total left knee  . KNEE ARTHROPLASTY  04/23/2012   right   . KNEE ARTHROSCOPY    . ROTATOR CUFF REPAIR     left   . SHOULDER SURGERY      right to repair ligament tear  . TOTAL HIP ARTHROPLASTY Left 09/09/2015   Procedure: LEFT TOTAL HIP ARTHROPLASTY ANTERIOR APPROACH;  Surgeon: Kathryne Hitch, MD;  Location: [facility];  Service: Orthopedics;  Laterality: Left;  . TOTAL KNEE ARTHROPLASTY  04/23/2012   Procedure: TOTAL KNEE ARTHROPLASTY;  Surgeon: [name], MD;  Location: [facility] OR;  Service: Orthopedics;  Laterality: Right;  Right Total Knee Arthroplasty  . TUBAL LIGATION  1996   Social History   Occupational History  . Not on file.   Social History Main Topics  . Smoking status: Former Smoker    Packs/day: 0.50    Years: 4.00    Quit date: 09/18/1991  . Smokeless tobacco: Never  Used  . Alcohol use No  . Drug use: No  . Sexual activity: Yes    Birth control/ protection: Surgical

## 2017-06-21 ENCOUNTER — Telehealth (INDEPENDENT_AMBULATORY_CARE_PROVIDER_SITE_OTHER): Payer: Self-pay | Admitting: Radiology

## 2017-06-21 NOTE — Telephone Encounter (Signed)
Patient is calling states that she does want to schedule for an injection.  Looking at her office note from 05/29/17 that she was anxious about the injections and so a consult was done and that she would call back if she wanted to do the injections.  --Not sure what injection needs to be done. Please advise. Patients call back number is [phone]

## 2017-06-26 NOTE — Telephone Encounter (Signed)
Bilateral L4-5 facet 

## 2017-06-27 NOTE — Telephone Encounter (Signed)
I called and lmom for pt to call back to sched

## 2017-07-01 ENCOUNTER — Telehealth (INDEPENDENT_AMBULATORY_CARE_PROVIDER_SITE_OTHER): Payer: Self-pay | Admitting: Physical Medicine and Rehabilitation

## 2017-07-01 NOTE — Telephone Encounter (Signed)
Bilateral L4-5 facet block but if the right hip leg is worse and has numbnes then L5-S1 interlam esi

## 2017-07-01 NOTE — Telephone Encounter (Signed)
More hip and leg pain- bilateral now. Scheduled for 10/29 at 1530.

## 2017-07-01 NOTE — Telephone Encounter (Signed)
Patient would like Valium before injection. [facility] [place]/ [place].

## 2017-07-02 MED ORDER — DIAZEPAM 5 MG PO TABS: ORAL_TABLET | ORAL | 0 refills | Status: DC

## 2017-07-02 NOTE — Telephone Encounter (Signed)
Printed rx  

## 2017-07-02 NOTE — Telephone Encounter (Signed)
Faxed

## 2017-07-03 ENCOUNTER — Ambulatory Visit (INDEPENDENT_AMBULATORY_CARE_PROVIDER_SITE_OTHER): Payer: Medicare Other | Admitting: Orthopaedic Surgery

## 2017-07-03 ENCOUNTER — Other Ambulatory Visit (INDEPENDENT_AMBULATORY_CARE_PROVIDER_SITE_OTHER): Payer: Self-pay

## 2017-07-03 DIAGNOSIS — M25551 Pain in right hip: Secondary | ICD-10-CM

## 2017-07-03 DIAGNOSIS — M5441 Lumbago with sciatica, right side: Secondary | ICD-10-CM

## 2017-07-03 NOTE — Progress Notes (Signed)
The patient is well-known to me. She is significantly morbidly obese female that I performed a left total hip arthroplasty on in December 2016. Her postoperative course was, located by superficial infection with adipose tissue necrosis. 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 She is actually scheduled for lumbar spine injections by Dr. Alvester MorinNewton on October 29. I will like to see if he would consider an intra-articular injection of her right hip at the same time. She is not a diabetic. She understands my concerns about surgery on her right hip given her significant thigh obesity and significant adipose tissue in this area that makes be quite worried about getting an implant and safely and dealing with the adipose tissue in this area.  On exam I can put her hip through range of motion on the right side but is painful to her in the groin area.  At this point we will set her up for steroid injection. I'll see her back in about 6 weeks to see how she is done. I think at some point losing weight for sure is in a been a better interest for her and until she loses more weight around the thigh I would likely still not be called with a hip replacement on that side.

## 2017-07-05 NOTE — Telephone Encounter (Signed)
sched for 07/15/2017

## 2017-07-15 ENCOUNTER — Encounter (INDEPENDENT_AMBULATORY_CARE_PROVIDER_SITE_OTHER): Payer: Self-pay | Admitting: Physical Medicine and Rehabilitation

## 2017-07-15 ENCOUNTER — Ambulatory Visit (INDEPENDENT_AMBULATORY_CARE_PROVIDER_SITE_OTHER): Payer: Medicare Other | Admitting: Physical Medicine and Rehabilitation

## 2017-07-15 ENCOUNTER — Ambulatory Visit (INDEPENDENT_AMBULATORY_CARE_PROVIDER_SITE_OTHER): Payer: Medicare Other

## 2017-07-15 VITALS — BP 157/88 | HR 73 | Temp 98.5°F

## 2017-07-15 DIAGNOSIS — M48062 Spinal stenosis, lumbar region with neurogenic claudication: Secondary | ICD-10-CM

## 2017-07-15 DIAGNOSIS — M5416 Radiculopathy, lumbar region: Secondary | ICD-10-CM | POA: Diagnosis not present

## 2017-07-15 DIAGNOSIS — M25551 Pain in right hip: Secondary | ICD-10-CM

## 2017-07-15 DIAGNOSIS — M47816 Spondylosis without myelopathy or radiculopathy, lumbar region: Secondary | ICD-10-CM

## 2017-07-15 MED ORDER — BETAMETHASONE SOD PHOS & ACET 6 (3-3) MG/ML IJ SUSP
12.0000 mg | Freq: Once | INTRAMUSCULAR | Status: AC
  Administered 2017-07-15: 12 mg

## 2017-07-15 MED ORDER — LIDOCAINE HCL (PF) 1 % IJ SOLN
2.0000 mL | Freq: Once | INTRAMUSCULAR | Status: AC
  Administered 2017-07-15: 2 mL

## 2017-07-15 NOTE — Progress Notes (Deleted)
Pt here to get Lumbar spine injection. Pt not in pain at the moment but when she is it hurts every where. Has numbness in bilateral feet. When pt stands for long periods or bends over it hurts. Pt taking dissolvable film for pain. Pt does have driver, not on any blood thinners, no allergy to contrast dye. .[name]

## 2017-07-15 NOTE — Patient Instructions (Signed)

## 2017-07-16 NOTE — Procedures (Signed)
Marilyn [name] is a 46 year old female that we saw for evaluation of potential injection of her lumbar spine.  Her lumbar spine show significant facet arthropathy with gaping facet joints at L4-5 with some lateral recess and B.  She has bilateral hip and leg pain in a radicular type fashion worse with standing and ambulating.  She also has pretty significant hip arthritis and has seen Dr. Magnus IvanBlackman since I saw her.  He requested right hip injection diagnostically and therapeutically.  We are going to complete the epidural injection today followed in 2 weeks by the hip injection.  She may unfortunately need facet joint blocks as well.  She is morbidly obese and has had a prior left total hip arthroplasty with complications.  She also takes buprenorphine palpable film.  I am unsure who prescribes this medication but she is reserved for chronic pain management.  Lumbar Epidural Steroid Injection - Interlaminar Approach with Fluoroscopic Guidance  Patient: Marilyn [name]      Date of Birth: June 28, 1971 MRN: [id number] PCP: Joette CatchingNyland, Leonard, MD      Visit Date: 07/15/2017   Universal Protocol:     Consent Given By: the patient  Position: PRONE  Additional Comments: Vital signs were monitored before and after the procedure. Patient was prepped and draped in the usual sterile fashion. The correct patient, procedure, and site was verified.   Injection Procedure Details:  Procedure Site One Meds Administered:  Meds ordered this encounter  Medications  . lidocaine (PF) (XYLOCAINE) 1 % injection 2 mL  . betamethasone acetate-betamethasone sodium phosphate (CELESTONE) injection 12 mg     Laterality: Right  Location/Site:  L5-S1  Needle size: 20 G  Needle type: Tuohy  Needle Placement: Paramedian epidural  Findings:  -Contrast Used: 0.5 mL iohexol 180 mg iodine/mL   -Comments: Excellent flow of contrast into the epidural space.  Procedure Details: Using a paramedian approach from the  side mentioned above, the region overlying the inferior lamina was localized under fluoroscopic visualization and the soft tissues overlying this structure were infiltrated with 4 ml. of 1% Lidocaine without Epinephrine. The Tuohy needle was inserted into the epidural space using a paramedian approach.   The epidural space was localized using loss of resistance along with lateral and bi-planar fluoroscopic views.  After negative aspirate for air, blood, and CSF, a 2 ml. volume of Isovue-250 was injected into the epidural space and the flow of contrast was observed. Radiographs were obtained for documentation purposes.    The injectate was administered into the level noted above.   Additional Comments:  The patient tolerated the procedure well Dressing: Band-Aid    Post-procedure details: Patient was observed during the procedure. Post-procedure instructions were reviewed.  Patient left the clinic in stable condition.

## 2017-07-24 ENCOUNTER — Telehealth (INDEPENDENT_AMBULATORY_CARE_PROVIDER_SITE_OTHER): Payer: Self-pay | Admitting: Physical Medicine and Rehabilitation

## 2017-07-24 ENCOUNTER — Other Ambulatory Visit (INDEPENDENT_AMBULATORY_CARE_PROVIDER_SITE_OTHER): Payer: Self-pay | Admitting: Physical Medicine and Rehabilitation

## 2017-07-24 MED ORDER — DIAZEPAM 5 MG PO TABS: ORAL_TABLET | ORAL | 0 refills | Status: DC

## 2017-07-24 NOTE — Telephone Encounter (Signed)
printed

## 2017-07-25 NOTE — Telephone Encounter (Signed)
Faxed to patient's pharmacy.  

## 2017-07-29 ENCOUNTER — Ambulatory Visit (INDEPENDENT_AMBULATORY_CARE_PROVIDER_SITE_OTHER): Payer: Medicare Other | Admitting: Physical Medicine and Rehabilitation

## 2017-08-05 ENCOUNTER — Ambulatory Visit (INDEPENDENT_AMBULATORY_CARE_PROVIDER_SITE_OTHER): Payer: Medicare Other | Admitting: Physical Medicine and Rehabilitation

## 2017-08-12 ENCOUNTER — Ambulatory Visit (INDEPENDENT_AMBULATORY_CARE_PROVIDER_SITE_OTHER): Payer: Medicare Other | Admitting: Orthopaedic Surgery

## 2017-08-19 ENCOUNTER — Telehealth (INDEPENDENT_AMBULATORY_CARE_PROVIDER_SITE_OTHER): Payer: Self-pay | Admitting: Physical Medicine and Rehabilitation

## 2017-08-19 ENCOUNTER — Ambulatory Visit (INDEPENDENT_AMBULATORY_CARE_PROVIDER_SITE_OTHER): Payer: Medicare Other | Admitting: Physical Medicine and Rehabilitation

## 2017-08-19 ENCOUNTER — Ambulatory Visit (INDEPENDENT_AMBULATORY_CARE_PROVIDER_SITE_OTHER): Payer: Self-pay

## 2017-08-19 ENCOUNTER — Encounter (INDEPENDENT_AMBULATORY_CARE_PROVIDER_SITE_OTHER): Payer: Self-pay | Admitting: Physical Medicine and Rehabilitation

## 2017-08-19 DIAGNOSIS — M25551 Pain in right hip: Secondary | ICD-10-CM

## 2017-08-19 DIAGNOSIS — M545 Low back pain, unspecified: Secondary | ICD-10-CM

## 2017-08-19 DIAGNOSIS — G8929 Other chronic pain: Secondary | ICD-10-CM | POA: Diagnosis not present

## 2017-08-19 NOTE — Progress Notes (Signed)
Marilyn [name] - 46 y.o. female MRN [id number]  Date of birth: 1971/06/27  Office Visit Note: Visit Date: 08/19/2017 PCP: Joette CatchingNyland, Leonard, MD Referred by: Joette CatchingNyland, Leonard, MD  Subjective: Chief Complaint  Patient presents with  . Right Hip - Pain   HPI: Marilyn [name] is a morbidly obese 46 year old female with chronic pain syndrome and chronic musculoskeletal pain.  We have seen her on occasion for back injection which was somewhat beneficial for her leg pain but not her back pain.  She has been followed by Dr. Magnus IvanBlackman for her hip pain.  We completed epidural injection which was not very beneficial.  She comes in today for planned hip injection as requested by Dr. Magnus IvanBlackman.  She has a history of prior total knee replacement and total hip replacement on the left.  She has had prior shoulder surgery.  She is on chronic buprenorphine and recovering Narcan as well as Cymbalta and gabapentin.  She has had various prescriptions for short acting opioid medications for different conditions.  She is followed by chronic pain management.  She reports most of her pain is in the low back and not the hip.  She is a very complicated pain patient again morbidly obese.  She has done well with prior hip replacement.  She denies any numbness tingling or paresthesia.  She denies any focal weakness but feels weak overall.  She has had no new trauma.    Review of Systems  Constitutional: Negative for chills, fever, malaise/fatigue and weight loss.  HENT: Negative for hearing loss and sinus pain.   Eyes: Negative for blurred vision, double vision and photophobia.  Respiratory: Negative for cough and shortness of breath.   Cardiovascular: Negative for chest pain, palpitations and leg swelling.  Gastrointestinal: Negative for abdominal pain, nausea and vomiting.  Genitourinary: Negative for flank pain.  Musculoskeletal: Positive for back pain and joint pain. Negative for myalgias.  Skin: Negative for itching and  rash.  Neurological: Negative for tremors, focal weakness and weakness.  Endo/Heme/Allergies: Negative.   Psychiatric/Behavioral: Negative for depression.  All other systems reviewed and are negative.  Otherwise per HPI.  Assessment & Plan: Visit Diagnoses:  1. Pain in right hip   2. Chronic bilateral low back pain without sciatica     Plan: Findings:  Acute on chronic axial low back pain which is felt to be probably facet mediated low back pain.  MRI evidence shows facet arthropathy without much in the way of stenosis or disc herniation.  Epidural injection helped to a degree of her leg pain.  Her hip right now is not bothering her to a great deal.  She has had prior total hip replacement on the other side.  She really is someone who has a complicated pain picture and would best be served in multifactorial pain management where she already receives treatment.  Nonetheless I did tell her that we could have her back and at least go over her spine once again from an evaluation standpoint.  She was scheduled today of the proposed injection of the hip which she really just did not need today.    Meds & Orders: No orders of the defined types were placed in this encounter.  No orders of the defined types were placed in this encounter.   Follow-up: Return if symptoms worsen or fail to improve.   Procedures: No procedures performed  No notes on file   Clinical History: MRI LUMBAR SPINE WITHOUT CONTRAST  TECHNIQUE: Multiplanar, multisequence MR  imaging of the lumbar spine was performed. No intravenous contrast was administered.  COMPARISON:  Plain films lumbar spine 11/16/2016. MRI lumbar spine 03/17/2014. This examination is incomplete. No axial imaging is available.  FINDINGS: Segmentation:  Standard.  Alignment:  Maintained.  Vertebrae:  No fracture or worrisome lesion.  Conus medullaris: Extends to the L1 level and appears normal.  Paraspinal and other soft tissues:  Negative.  Disc levels:  T11-12 is imaged in the sagittal plane only. Shallow central protrusion at this level without stenosis is identified.  T12-L1:  Negative.  L1-2:  Negative.  L2-3:  Negative.  L3-4: Mild facet degenerative change. There is some ligamentum flavum thickening. No disc bulge or protrusion. The central canal and foramina are open.  L4-5: Bilateral facet degenerative change with associated effusions. There is a shallow disc bulge without central canal stenosis. Moderately severe right foraminal narrowing appears worse than on the prior examination. There is moderate left foraminal narrowing.  L5-S1: Advanced bilateral facet degenerative disease. Shallow disc bulge without central canal stenosis. Moderate to moderately severe foraminal narrowing at this level is worse on the right and unchanged in appearance.  IMPRESSION: Spondylosis appearing worst at L4-5 where there is moderately severe right foraminal narrowing, worse than on the prior examination. Facet arthropathy and new bilateral facet joint effusions are present this level. The central canal is open.  Advanced bilateral facet degenerative disease L5-S1. Moderate to moderately severe bilateral foraminal narrowing at this level is unchanged.   Electronically Signed   By: Drusilla Kanner M.D.   On: 04/19/2017 19:13  She reports that she quit smoking about 25 years ago. She has a 2.00 pack-year smoking history. she has never used smokeless tobacco. No results for input(s): HGBA1C, LABURIC in the last 8760 hours.  Objective:  VS:  HT:    WT:   BMI:     BP:   HR: bpm  TEMP: ( )  RESP:  Physical Exam  Constitutional: She is oriented to person, place, and time. She appears well-developed and well-nourished.  Morbidly obese  Eyes: Conjunctivae and EOM are normal. Pupils are equal, round, and reactive to light.  Cardiovascular: Normal rate and intact distal pulses.  Pulmonary/Chest:  Effort normal.  Musculoskeletal:  Patient is slow to stand from a seated position.  She does not seem to have much pain with hip rotation.  She has good distal strength.  Neurological: She is alert and oriented to person, place, and time. She exhibits normal muscle tone.  Skin: Skin is warm and dry. No rash noted. No erythema.  Psychiatric: She has a normal mood and affect. Her behavior is normal.  Nursing note and vitals reviewed.   Ortho Exam Imaging: No results found.  Past Medical/Family/Surgical/Social History: Medications & Allergies reviewed per EMR Patient Active Problem List   Diagnosis Date Noted  . Pain of right hip joint 07/03/2017  . Acute right-sided low back pain with right-sided sciatica 03/27/2017  . Chronic right shoulder pain 02/13/2017  . History of rotator cuff tear 11/30/2016  . Impingement syndrome of right shoulder 11/30/2016  . Exacerbation of asthma 07/18/2016  . Hypokalemia 07/18/2016  . Hypertension 07/18/2016  . Depression with anxiety 07/18/2016  . Hypothyroidism 07/18/2016  . Hyperglycemia 07/18/2016  . Acute asthma exacerbation 07/18/2016  . Surgical wound dehiscence left hip; questionable superficial infection 11/10/2015  . Left hip postoperative wound infection 11/10/2015  . Osteoarthritis of left hip 09/09/2015  . 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  Obesity (BMI 35.0-39.9 without comorbidity) 04/28/2013   Past Medical History:  Diagnosis Date  . Anemia    taking iron now. pt states having no current issues 09/02/2015  . Anginal pain (HCC)    pt states experiences chest wall pain pt states related to her asthma   . Anxiety    with MRI's  . Arthritis   . Asthma   . Dizziness   . GERD (gastroesophageal reflux disease)   . Headache(784.0)    HX OF MIGRAINES  . History of bronchitis   . Hypertension   . Hypothyroidism    takes levothyroxen  . OSA on CPAP   . Shortness of breath    with exertion  . Wears glasses      Family History  Problem Relation Age of Onset  . Cancer Mother        colon  . Epilepsy Mother   . Cancer Father        prostate  . Diabetes Father   . Hypertension Father   . Hypertension Maternal Aunt   . Diabetes Maternal Aunt   . Hypertension Paternal Aunt    Past Surgical History:  Procedure Laterality Date  . CESAREAN SECTION     times 2  . CHOLECYSTECTOMY    . ENDOMETRIAL ABLATION    . INCISION AND DRAINAGE HIP Left 11/10/2015   Procedure: IRRIGATION AND DEBRIDEMENT LEFT HIP INCISION;  Surgeon: Kathryne Hitchhristopher Y Blackman, MD;  Location: [facility];  Service: Orthopedics;  Laterality: Left;  . JOINT REPLACEMENT  2011   total left knee  . KNEE ARTHROPLASTY  04/23/2012   right   . KNEE ARTHROSCOPY    . ROTATOR CUFF REPAIR     left   . SHOULDER SURGERY     right to repair ligament tear  . TOTAL HIP ARTHROPLASTY Left 09/09/2015   Procedure: LEFT TOTAL HIP ARTHROPLASTY ANTERIOR APPROACH;  Surgeon: Kathryne Hitchhristopher Y Blackman, MD;  Location: [facility];  Service: Orthopedics;  Laterality: Left;  . TOTAL KNEE ARTHROPLASTY  04/23/2012   Procedure: TOTAL KNEE ARTHROPLASTY;  Surgeon: Nadara MustardMarcus V Duda, MD;  Location: [facility];  Service: Orthopedics;  Laterality: Right;  Right Total Knee Arthroplasty  . TUBAL LIGATION  1996   Social History   Occupational History  . Not on file  Tobacco Use  . Smoking status: Former Smoker    Packs/day: 0.50    Years: 4.00    Pack years: 2.00    Last attempt to quit: 09/18/1991    Years since quitting: 25.9  . Smokeless tobacco: Never Used  Substance and Sexual Activity  . Alcohol use: No  . Drug use: No  . Sexual activity: Yes    Birth control/protection: Surgical

## 2017-08-19 NOTE — Patient Instructions (Signed)

## 2017-08-22 ENCOUNTER — Ambulatory Visit (INDEPENDENT_AMBULATORY_CARE_PROVIDER_SITE_OTHER): Payer: Medicare Other | Admitting: Orthopaedic Surgery

## 2017-08-22 LAB — PULMONARY FUNCTION TEST
DL/VA % pred: 103 %
DL/VA: 4.84 ml/min/mmHg/L
DLCO UNC: 17.51 ml/min/mmHg
DLCO unc % pred: 76 %
FEF 25-75 POST: 3.2 L/s
FEF 25-75 Pre: 2.51 L/sec
FEF2575-%Change-Post: 27 %
FEF2575-%Pred-Post: 129 %
FEF2575-%Pred-Pre: 101 %
FEV1-%CHANGE-POST: 4 %
FEV1-%PRED-POST: 98 %
FEV1-%PRED-PRE: 94 %
FEV1-POST: 2.26 L
FEV1-Pre: 2.16 L
FEV1FVC-%Change-Post: 3 %
FEV1FVC-%PRED-PRE: 103 %
FEV6-%CHANGE-POST: 0 %
FEV6-%PRED-POST: 92 %
FEV6-%Pred-Pre: 91 %
FEV6-POST: 2.55 L
FEV6-Pre: 2.53 L
FEV6FVC-%CHANGE-POST: 0 %
FEV6FVC-%PRED-POST: 103 %
FEV6FVC-%PRED-PRE: 103 %
FVC-%Change-Post: 1 %
FVC-%Pred-Post: 91 %
FVC-%Pred-Pre: 89 %
FVC-Post: 2.59 L
FVC-Pre: 2.55 L
PRE FEV6/FVC RATIO: 100 %
Post FEV1/FVC ratio: 87 %
Post FEV6/FVC ratio: 100 %
Pre FEV1/FVC ratio: 85 %
RV % PRED: 77 %
RV: 1.31 L
TLC % PRED: 84 %
TLC: 4.12 L

## 2017-08-22 NOTE — Telephone Encounter (Signed)
Closing encounter

## 2017-08-27 ENCOUNTER — Encounter (INDEPENDENT_AMBULATORY_CARE_PROVIDER_SITE_OTHER): Payer: Self-pay | Admitting: Physical Medicine and Rehabilitation

## 2017-08-27 NOTE — Telephone Encounter (Signed)
Reviewed chart, need to have her come in for OV and possible facet injection, she is in chronic pain management and can also follow with them.

## 2017-09-05 NOTE — Telephone Encounter (Signed)
Patient is scheduled and would like Valium prior. [facility] [place].

## 2017-09-19 ENCOUNTER — Other Ambulatory Visit (INDEPENDENT_AMBULATORY_CARE_PROVIDER_SITE_OTHER): Payer: Self-pay | Admitting: Physical Medicine and Rehabilitation

## 2017-09-19 ENCOUNTER — Telehealth (INDEPENDENT_AMBULATORY_CARE_PROVIDER_SITE_OTHER): Payer: Self-pay | Admitting: *Deleted

## 2017-09-19 MED ORDER — DIAZEPAM 5 MG PO TABS: ORAL_TABLET | ORAL | 0 refills | Status: DC

## 2017-09-19 NOTE — Telephone Encounter (Signed)
Done, printed.

## 2017-09-19 NOTE — Telephone Encounter (Signed)
Faxed to patient's pharmacy. Patient notified.

## 2017-09-23 ENCOUNTER — Ambulatory Visit (INDEPENDENT_AMBULATORY_CARE_PROVIDER_SITE_OTHER): Payer: Medicare Other | Admitting: Physical Medicine and Rehabilitation

## 2017-09-23 ENCOUNTER — Encounter (INDEPENDENT_AMBULATORY_CARE_PROVIDER_SITE_OTHER): Payer: Self-pay | Admitting: Physical Medicine and Rehabilitation

## 2017-09-23 VITALS — BP 188/93 | HR 68 | Temp 98.3°F

## 2017-09-23 DIAGNOSIS — M47816 Spondylosis without myelopathy or radiculopathy, lumbar region: Secondary | ICD-10-CM | POA: Diagnosis not present

## 2017-09-23 DIAGNOSIS — M545 Low back pain, unspecified: Secondary | ICD-10-CM

## 2017-09-23 DIAGNOSIS — G8929 Other chronic pain: Secondary | ICD-10-CM | POA: Diagnosis not present

## 2017-09-23 MED ORDER — METHYLPREDNISOLONE ACETATE 80 MG/ML IJ SUSP
80.0000 mg | Freq: Once | INTRAMUSCULAR | Status: AC
  Administered 2017-09-23: 80 mg

## 2017-09-23 NOTE — Patient Instructions (Signed)

## 2017-09-23 NOTE — Progress Notes (Deleted)
Pt states a needle feeling pain in neck that radiates to both buttocks with some tingling in left foot. Pt states if she puts her feet on the cold floor it eases pain, standing on feet for a long period of time makes pain worse. +Driver, -BT, -Dye Allergies.

## 2017-09-24 ENCOUNTER — Ambulatory Visit (INDEPENDENT_AMBULATORY_CARE_PROVIDER_SITE_OTHER): Payer: Medicare Other

## 2017-09-24 NOTE — Procedures (Signed)
Marilyn [name] is a 47 year old morbidly obese female with chronic worsening back pain as well as hip pain.  She is followed by Dr. Magnus Ivan in our office for her hips and who kindly sent her to me for back injection.  She is someone that does not like injections at all.  We have provided Valium preprocedure.  I did see her evaluation recently we decided to complete facet joint block at L4-5.  She does have severe facet arthropathy at L4-5 and less so at L5-S1.  There is been no frank nerve compression on imaging.  She has pain really all over the lower back hips and legs and neck and arms.  She is in chronic pain management.  She is on buprenorphine.  Lumbar Facet Joint Intra-Articular Injection(s) with Fluoroscopic Guidance  Patient: Marilyn [name]      Date of Birth: [birth year] MRN: [id number] PCP: Joette Catching, MD      Visit Date: 09/23/2017   Universal Protocol:    Date/Time: 09/23/2017  Consent Given By: the patient  Position: PRONE   Additional Comments: Vital signs were monitored before and after the procedure. Patient was prepped and draped in the usual sterile fashion. The correct patient, procedure, and site was verified.   Injection Procedure Details:  Procedure Site One Meds Administered:  Meds ordered this encounter  Medications  . methylPREDNISolone acetate (DEPO-MEDROL) injection 80 mg     Laterality: Bilateral  Location/Site:  L4-L5  Needle size: 22 guage  Needle type: Spinal  Needle Placement: Articular  Findings:  -Comments: Excellent flow of contrast producing a partial arthrogram.  Procedure Details: The fluoroscope beam is vertically oriented in AP, and the inferior recess is visualized beneath the lower pole of the inferior apophyseal process, which represents the target point for needle insertion. When direct visualization is difficult the target point is located at the medial projection of the vertebral pedicle. The region overlying each  aforementioned target is locally anesthetized with a 1 to 2 ml. volume of 1% Lidocaine without Epinephrine.   The spinal needle was inserted into each of the above mentioned facet joints using biplanar fluoroscopic guidance. A 0.25 to 0.5 ml. volume of Isovue-250 was injected and a partial facet joint arthrogram was obtained. A single spot film was obtained of the resulting arthrogram.    One to 1.25 ml of the steroid/anesthetic solution was then injected into each of the facet joints noted above.   Additional Comments:  The patient tolerated the procedure well Dressing: Band-Aid    Post-procedure details: Patient was observed during the procedure. Post-procedure instructions were reviewed.  Patient left the clinic in stable condition.  Pertinent Imaging: MRI LUMBAR SPINE WITHOUT CONTRAST  TECHNIQUE: Multiplanar, multisequence MR imaging of the lumbar spine was performed. No intravenous contrast was administered.  COMPARISON:  Plain films lumbar spine 11/16/2016. MRI lumbar spine 03/17/2014. This examination is incomplete. No axial imaging is available.  FINDINGS: Segmentation:  Standard.  Alignment:  Maintained.  Vertebrae:  No fracture or worrisome lesion.  Conus medullaris: Extends to the L1 level and appears normal.  Paraspinal and other soft tissues: Negative.  Disc levels:  T11-12 is imaged in the sagittal plane only. Shallow central protrusion at this level without stenosis is identified.  T12-L1:  Negative.  L1-2:  Negative.  L2-3:  Negative.  L3-4: Mild facet degenerative change. There is some ligamentum flavum thickening. No disc bulge or protrusion. The central canal and foramina are open.  L4-5: Bilateral facet degenerative change with associated  effusions. There is a shallow disc bulge without central canal stenosis. Moderately severe right foraminal narrowing appears worse than on the prior examination. There is moderate left  foraminal narrowing.  L5-S1: Advanced bilateral facet degenerative disease. Shallow disc bulge without central canal stenosis. Moderate to moderately severe foraminal narrowing at this level is worse on the right and unchanged in appearance.  IMPRESSION: Spondylosis appearing worst at L4-5 where there is moderately severe right foraminal narrowing, worse than on the prior examination. Facet arthropathy and new bilateral facet joint effusions are present this level. The central canal is open.  Advanced bilateral facet degenerative disease L5-S1. Moderate to moderately severe bilateral foraminal narrowing at this level is unchanged.   Electronically Signed   By: Drusilla Kannerhomas  Dalessio M.D.   On: 04/19/2017 19:13

## 2017-10-04 ENCOUNTER — Ambulatory Visit (INDEPENDENT_AMBULATORY_CARE_PROVIDER_SITE_OTHER): Payer: Medicare Other | Admitting: Family

## 2017-10-04 ENCOUNTER — Encounter (INDEPENDENT_AMBULATORY_CARE_PROVIDER_SITE_OTHER): Payer: Self-pay | Admitting: Family

## 2017-10-04 DIAGNOSIS — Z8739 Personal history of other diseases of the musculoskeletal system and connective tissue: Secondary | ICD-10-CM | POA: Diagnosis not present

## 2017-10-04 DIAGNOSIS — M7541 Impingement syndrome of right shoulder: Secondary | ICD-10-CM | POA: Diagnosis not present

## 2017-10-04 NOTE — Progress Notes (Signed)
Office Visit Note   Patient: Marilyn [name]           Date of Birth: 1970/09/19           MRN: [id number] Visit Date: 10/04/2017              Requested by: Joette Catching, MD [address] [place], [place] [address] PCP: Joette Catching, MD  Chief Complaint  Patient presents with  . Right Shoulder - Follow-up      HPI: Patient is a 47 year old woman with return of her right shoulder pain. Has has worsening pain over last 2 weeks. States about a week ago had to lift a client that had fallen. Had immedate worsening over her pain following this.    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 She has completed physical therapy at [facility] outpatient last summer.   She states the pain is worse at the end of the day. she states she still has decreased range of motion, pain with above head reaching.   Has a history of 2 arthroscopies on the right shoulder as well as the left.  Assessment & Plan: Visit Diagnoses:  1. Diabetic neurogenic arthropathy (HCC)      Plan: Recommended continue with her physical therapy exercises. Will trial a subacromial injection.    Follow-Up Instructions: No Follow-up on file.   Right Shoulder Exam   Tenderness  Right shoulder tenderness location: global.  Range of Motion  Active abduction: abnormal  Passive abduction: normal  Extension: 40   Muscle Strength  The patient has normal right shoulder strength.  Tests  Impingement: positive  Other  Erythema: absent Pulse: present      Patient is alert, oriented, no adenopathy, well-dressed, normal affect, normal respiratory effort. Morbidly obese.   Imaging: No results found.  Labs: Lab Results  Component Value Date   HGBA1C 6.2 (H) 07/18/2016   HGBA1C 6.2 (H) 09/02/2015   ESRSEDRATE 122 (H) 11/11/2015   CRP 11.2 (H) 11/11/2015   REPTSTATUS 10/02/2015 FINAL 09/29/2015   CULT  09/29/2015    NO GROUP A STREP (S.PYOGENES) ISOLATED Performed at [facility] 07/18/2012    Orders:  No orders of the defined types were placed in this encounter.  No orders of the defined types were placed in this encounter.    Procedures: Large Joint Inj: R subacromial bursa on 10/04/2017 9:51 AM Indications: pain Details: 22 G 1.5 in needle Medications: 5 mL lidocaine 1 %; 40 mg methylPREDNISolone acetate 40 MG/ML Consent was given by the patient.      Clinical Data: No additional findings.  ROS:  All other systems negative, except as noted in the HPI. Review of Systems  Constitutional: Negative for chills and fever.  Musculoskeletal: Positive for arthralgias and neck pain. Negative for joint swelling and neck stiffness.    Objective: Vital Signs: There were no vitals taken for this visit.  Specialty Comments:  No specialty comments available.  PMFS History: Patient Active Problem List   Diagnosis Date Noted  . Pain of right hip joint 07/03/2017  . Acute right-sided low back pain with right-sided sciatica 03/27/2017  . Chronic right shoulder pain 02/13/2017  . History of rotator cuff tear 11/30/2016  . Impingement syndrome of right shoulder 11/30/2016  . Exacerbation of asthma 07/18/2016  . Hypokalemia 07/18/2016  . Hypertension 07/18/2016  . Depression with anxiety 07/18/2016  . Hypothyroidism 07/18/2016  . Hyperglycemia 07/18/2016  . Acute asthma  exacerbation 07/18/2016  . Surgical wound dehiscence left hip; questionable superficial infection 11/10/2015  . Left hip postoperative wound infection 11/10/2015  . Osteoarthritis of left hip 09/09/2015  . 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 Obesity (BMI 35.0-39.9 without comorbidity) 04/28/2013   Past Medical History:  Diagnosis Date  . Anemia    taking iron now. pt states having no current issues 09/02/2015  . Anginal pain (HCC)    pt states experiences chest wall pain pt states related to her asthma   . Anxiety    with  MRI's  . Arthritis   . Asthma   . Dizziness   . GERD (gastroesophageal reflux disease)   . Headache(784.0)    HX OF MIGRAINES  . History of bronchitis   . Hypertension   . Hypothyroidism    takes levothyroxen  . OSA on CPAP   . Shortness of breath    with exertion  . Wears glasses     Family History  Problem Relation Age of Onset  . Cancer Mother        colon  . Epilepsy Mother   . Cancer Father        prostate  . Diabetes Father   . Hypertension Father   . Hypertension Maternal Aunt   . Diabetes Maternal Aunt   . Hypertension Paternal Aunt     Past Surgical History:  Procedure Laterality Date  . CESAREAN SECTION     times 2  . CHOLECYSTECTOMY    . ENDOMETRIAL ABLATION    . INCISION AND DRAINAGE HIP Left 11/10/2015   Procedure: IRRIGATION AND DEBRIDEMENT LEFT HIP INCISION;  Surgeon: Kathryne Hitchhristopher Y Blackman, MD;  Location: [facility] OR;  Service: Orthopedics;  Laterality: Left;  . JOINT REPLACEMENT  2011   total left knee  . KNEE ARTHROPLASTY  04/23/2012   right   . KNEE ARTHROSCOPY    . ROTATOR CUFF REPAIR     left   . SHOULDER SURGERY     right to repair ligament tear  . TOTAL HIP ARTHROPLASTY Left 09/09/2015   Procedure: LEFT TOTAL HIP ARTHROPLASTY ANTERIOR APPROACH;  Surgeon: Kathryne Hitchhristopher Y Blackman, MD;  Location: [facility];  Service: Orthopedics;  Laterality: Left;  . TOTAL KNEE ARTHROPLASTY  04/23/2012   Procedure: TOTAL KNEE ARTHROPLASTY;  Surgeon: Nadara MustardMarcus V Duda, MD;  Location: [facility] OR;  Service: Orthopedics;  Laterality: Right;  Right Total Knee Arthroplasty  . TUBAL LIGATION  1996   Social History   Occupational History  . Not on file  Tobacco Use  . Smoking status: Former Smoker    Packs/day: 0.50    Years: 4.00    Pack years: 2.00    Last attempt to quit: 09/18/1991    Years since quitting: 26.0  . Smokeless tobacco: Never Used  Substance and Sexual Activity  . Alcohol use: No  . Drug use: No  . Sexual activity: Yes    Birth control/protection: Surgical

## 2017-10-07 MED ORDER — LIDOCAINE HCL 1 % IJ SOLN
5.0000 mL | INTRAMUSCULAR | Status: AC | PRN
  Administered 2017-10-04: 5 mL

## 2017-10-07 MED ORDER — METHYLPREDNISOLONE ACETATE 40 MG/ML IJ SUSP
40.0000 mg | INTRAMUSCULAR | Status: AC | PRN
  Administered 2017-10-04: 40 mg via INTRA_ARTICULAR

## 2017-11-11 ENCOUNTER — Ambulatory Visit (INDEPENDENT_AMBULATORY_CARE_PROVIDER_SITE_OTHER): Payer: Medicare Other | Admitting: Orthopaedic Surgery

## 2017-11-12 ENCOUNTER — Ambulatory Visit (INDEPENDENT_AMBULATORY_CARE_PROVIDER_SITE_OTHER): Payer: Medicare Other | Admitting: Orthopaedic Surgery

## 2017-11-12 ENCOUNTER — Encounter (INDEPENDENT_AMBULATORY_CARE_PROVIDER_SITE_OTHER): Payer: Self-pay | Admitting: Orthopaedic Surgery

## 2017-11-12 ENCOUNTER — Other Ambulatory Visit (INDEPENDENT_AMBULATORY_CARE_PROVIDER_SITE_OTHER): Payer: Self-pay

## 2017-11-12 DIAGNOSIS — R200 Anesthesia of skin: Secondary | ICD-10-CM

## 2017-11-12 DIAGNOSIS — M5441 Lumbago with sciatica, right side: Secondary | ICD-10-CM | POA: Diagnosis not present

## 2017-11-12 DIAGNOSIS — G8929 Other chronic pain: Secondary | ICD-10-CM

## 2017-11-12 NOTE — Progress Notes (Signed)
The patient is a very pleasant 47 year old female with continued chronic low back pain with radicular symptoms going down her right leg.  She said the right leg is pretty numb to her at this point.  She has had at least 2 interventions by Dr. Alvester MorinNewton in the lumbar spine.  The first was in October 2019 and it looks like it was an L5-S1 injection of the right.  In January of this year looks like he did bilateral facet joint injections.  She is someone who is morbidly obese and weighs 330+ pounds.  She still having discomfort in her lumbar spine radiating down her right leg to her foot.  On exam she has limited flexion extension lumbar spine due to her significant obesity as well as her pain.  She has subjective numbness in the right lower extremity especially at the foot.  She has a positive straight leg raise on the right side.  At this point I would [facility] to work on anything to decrease her back pain including traction.  We will also send her for at least one more injection with Dr. Alvester MorinNewton at the right side at L4-L5 to see if this will help from a symptomatology standpoint.  If not other than losing weight we could send her to a spine surgeon for surgical evaluation without any guarantee that they would be able to do anything or not based on her weight.

## 2017-11-13 ENCOUNTER — Telehealth (INDEPENDENT_AMBULATORY_CARE_PROVIDER_SITE_OTHER): Payer: Self-pay | Admitting: *Deleted

## 2017-11-13 ENCOUNTER — Other Ambulatory Visit (INDEPENDENT_AMBULATORY_CARE_PROVIDER_SITE_OTHER): Payer: Self-pay | Admitting: Physical Medicine and Rehabilitation

## 2017-11-13 MED ORDER — DIAZEPAM 5 MG PO TABS: ORAL_TABLET | ORAL | 0 refills | Status: DC

## 2017-11-13 NOTE — Telephone Encounter (Signed)
rx printed

## 2017-11-13 NOTE — Progress Notes (Signed)
Valium pre-procedure 

## 2017-11-19 ENCOUNTER — Ambulatory Visit: Payer: Medicare Other | Attending: Orthopaedic Surgery | Admitting: Physical Therapy

## 2017-11-19 ENCOUNTER — Encounter: Payer: Self-pay | Admitting: Physical Therapy

## 2017-11-19 DIAGNOSIS — M5441 Lumbago with sciatica, right side: Secondary | ICD-10-CM | POA: Diagnosis present

## 2017-11-19 DIAGNOSIS — R293 Abnormal posture: Secondary | ICD-10-CM

## 2017-11-19 NOTE — Therapy (Signed)
[facility] [address] [place], [place], [address] Phone: [phone]   Fax:  [phone]  Physical Therapy Evaluation  Patient Details  Name: Marilyn [name] MRN: [id number] Date of Birth: 22-Nov-1970 Referring Provider: Gordy Savers MD.   Encounter Date: 11/19/2017  PT End of Session - 11/19/17 1411    Visit Number  1    Number of Visits  12    Date for PT Re-Evaluation  01/14/18    PT Start Time  0126    PT Stop Time  0218    PT Time Calculation (min)  52 min    Activity Tolerance  Patient tolerated treatment well    Behavior During Therapy  [facility] for tasks assessed/performed       Past Medical History:  Diagnosis Date  . Anemia    taking iron now. pt states having no current issues 09/02/2015  . Anginal pain (HCC)    pt states experiences chest wall pain pt states related to her asthma   . Anxiety    with MRI's  . Arthritis   . Asthma   . Dizziness   . GERD (gastroesophageal reflux disease)   . Headache(784.0)    HX OF MIGRAINES  . History of bronchitis   . Hypertension   . Hypothyroidism    takes levothyroxen  . OSA on CPAP   . Shortness of breath    with exertion  . Wears glasses     Past Surgical History:  Procedure Laterality Date  . CESAREAN SECTION     times 2  . CHOLECYSTECTOMY    . ENDOMETRIAL ABLATION    . INCISION AND DRAINAGE HIP Left 11/10/2015   Procedure: IRRIGATION AND DEBRIDEMENT LEFT HIP INCISION;  Surgeon: Kathryne Hitch, MD;  Location: [facility] OR;  Service: Orthopedics;  Laterality: Left;  . JOINT REPLACEMENT  2011   total left knee  . KNEE ARTHROPLASTY  04/23/2012   right   . KNEE ARTHROSCOPY    . ROTATOR CUFF REPAIR     left   . SHOULDER SURGERY     right to repair ligament tear  . TOTAL HIP ARTHROPLASTY Left 09/09/2015   Procedure: LEFT TOTAL HIP ARTHROPLASTY ANTERIOR APPROACH;  Surgeon: Kathryne Hitch, MD;  Location: [facility];  Service: Orthopedics;  Laterality:  Left;  . TOTAL KNEE ARTHROPLASTY  04/23/2012   Procedure: TOTAL KNEE ARTHROPLASTY;  Surgeon: [name], MD;  Location: [facility] OR;  Service: Orthopedics;  Laterality: Right;  Right Total Knee Arthroplasty  . TUBAL LIGATION  1996    There were no vitals filed for this visit.   Subjective Assessment - 11/19/17 1353    Subjective  The patient presents to OPPT with severe low back pain.  She has had ongoing low back pain for a long time but reports a recent severe exacerbation.  She is has had two injections in the past and is scheduled for another on 12/11/17.  She rates her pain at a 10+/10 today.  Any type of spinal movement is extremely painful.  She reports numbness over her right LE.    Pertinent History  H/o LBP; spinal arthritis.    Limitations  Walking    How long can you walk comfortably?  Short distances around house.    Diagnostic tests  MRI:  Please look under "Imaging" tab for details.    Patient Stated Goals  Get around without pain.    Pain Score  10-Worst pain ever  Pain Location  Back    Pain Orientation  Mid;Right    Pain Descriptors / Indicators  Stabbing;Sharp;Numbness    Pain Type  Acute pain    Pain Onset  1 to 4 weeks ago    Pain Frequency  Constant    Aggravating Factors   Sitting.    Pain Relieving Factors  Moving.         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    Referring Provider  Gordy Savers MD.    Onset Date/Surgical Date  -- ~a week and a half ago (recent exacerbation).      Precautions   Precautions  None      Restrictions   Weight Bearing Restrictions  No      Balance Screen   Has the patient fallen in the past 6 months  No    Has the patient had a decrease in activity level because of a fear of falling?   No    Is the patient reluctant to leave their home because of a fear of falling?   No      Home Environment   Living Environment   Private residence      Prior Function   Level of Independence  Independent      Posture/Postural Control   Posture Comments  Patient standing in 20 degrees of spinal flexion due to being in extreme pain.      ROM / Strength   AROM / PROM / Strength  AROM;Strength      AROM   Overall AROM Comments  Patient unable to move her spine from the 20 degree flexed position due to extreme pain and alos a fear of moving.        Strength   Overall Strength Comments  Normal right LE strength at right knee and ankle.      Palpation   Palpation comment  Extreme pain upon palpation at L4 to S1.      Special Tests   Other special tests  Unable to elicit right LE DTR's.  (+)Slump test.      Ambulation/Gait   Gait Comments  Patient walking in spinal flexion.  It is very obvious she is in severe pain today.             Objective measurements completed on examination: See above findings.      [facility] Adult PT Treatment/Exercise - 11/19/17 0001      Modalities   Modalities  Electrical Stimulation      Electrical Stimulation   Electrical Stimulation Location  Mid-lower back.    Electrical Stimulation Action  Pre-mod.    Electrical Stimulation Parameters  80-150 hz x 20 minutes.    Electrical Stimulation Goals  Pain                  PT Long Term Goals - 11/19/17 1413      PT LONG TERM GOAL #1   Title  independent with a HEP.    Time  6    Period  Weeks    Status  New      PT LONG TERM GOAL #2   Title  Walk a short cummunity distance wiht pain not > 4/10.    Time  6    Period  Weeks    Status  New      PT LONG TERM  GOAL #3   Title  Eliminate right LE symptoms.    Time  6    Period  Weeks    Status  New      PT LONG TERM GOAL #4   Title  Perform ADL's with pain not > 4/10.    Time  6    Period  Weeks    Status  New             Plan - 11/19/17 1408    Clinical Impression Statement  The patient reports an extreme exacerbation of low back pain with  right LE numbness about a week and a half ago.  She stands in 20 degrees of spinal flexion today and is unable to move from this position due to severe pain.  Her functional mobility is obvious severely impaired at this time.  She is scheduled for a spinal injection on 12/11/17.  The patient will benefit from skilled physical threapy intervention to begin with conservative treatment such as modalites.    Clinical Presentation  Evolving    Clinical Presentation due to:  Not improving.    Clinical Decision Making  Low    Clinical Impairments Affecting Rehab Potential  Severity of pain.    PT Frequency  2x / week    PT Duration  6 weeks    PT Treatment/Interventions  ADLs/Self Care Home Management;Cryotherapy;Electrical Stimulation;Ultrasound;Moist Heat;Therapeutic activities;Therapeutic exercise;Patient/family education;Manual techniques;Dry needling    PT Home Exercise Plan  Begin wihHMP, electrical stimulation; U/S and light STW/M.  Start with draw-in progression.    Consulted and Agree with Plan of Care  Patient       Patient will benefit from skilled therapeutic intervention in order to improve the following deficits and impairments:  Obesity, Decreased activity tolerance, Decreased range of motion, Difficulty walking, Decreased mobility, Postural dysfunction, Pain  Visit Diagnosis: Acute right-sided low back pain with right-sided sciatica - Plan: PT plan of care cert/re-cert  Abnormal posture - Plan: PT plan of care cert/re-cert     Problem List Patient Active Problem List   Diagnosis Date Noted  . Pain of right hip joint 07/03/2017  . Acute right-sided low back pain with right-sided sciatica 03/27/2017  . Chronic right shoulder pain 02/13/2017  . History of rotator cuff tear 11/30/2016  . Impingement syndrome of right shoulder 11/30/2016  . Exacerbation of asthma 07/18/2016  . Hypokalemia 07/18/2016  . Hypertension 07/18/2016  . Depression with anxiety 07/18/2016  .  Hypothyroidism 07/18/2016  . Hyperglycemia 07/18/2016  . Acute asthma exacerbation 07/18/2016  . Surgical wound dehiscence left hip; questionable superficial infection 11/10/2015  . Left hip postoperative wound infection 11/10/2015  . Osteoarthritis of left hip 09/09/2015  . 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 Obesity (BMI 35.0-39.9 without comorbidity) 04/28/2013    Ermon Sagan, [place] MPT 11/19/2017, 2:21 PM  [facility] [address] [place], [place], [address] Phone: [phone]   Fax:  [phone]  Name: Marilyn [name] MRN: [id number] Date of Birth: Feb 11, 1971

## 2017-11-21 ENCOUNTER — Encounter: Payer: Self-pay | Admitting: Physical Therapy

## 2017-11-21 ENCOUNTER — Ambulatory Visit: Payer: Medicare Other | Admitting: Physical Therapy

## 2017-11-21 DIAGNOSIS — M5441 Lumbago with sciatica, right side: Secondary | ICD-10-CM

## 2017-11-21 DIAGNOSIS — R293 Abnormal posture: Secondary | ICD-10-CM

## 2017-11-21 NOTE — Therapy (Signed)
[facility] [address] [place], [place], [address] Phone: [phone]   Fax:  [phone]  Physical Therapy Treatment  Patient Details  Name: Marilyn [name] MRN: [id number] Date of Birth: 1971/01/31 Referring Provider: Gordy Savershristopher Balckman MD.   Encounter Date: 11/21/2017  PT End of Session - 11/21/17 1346    Visit Number  2    Number of Visits  12    Date for PT Re-Evaluation  01/14/18    PT Start Time  1310    PT Stop Time  1354    PT Time Calculation (min)  44 min    Activity Tolerance  Patient tolerated treatment well    Behavior During Therapy  [facility] for tasks assessed/performed       Past Medical History:  Diagnosis Date  . Anemia    taking iron now. pt states having no current issues 09/02/2015  . Anginal pain (HCC)    pt states experiences chest wall pain pt states related to her asthma   . Anxiety    with MRI's  . Arthritis   . Asthma   . Dizziness   . GERD (gastroesophageal reflux disease)   . Headache(784.0)    HX OF MIGRAINES  . History of bronchitis   . Hypertension   . Hypothyroidism    takes levothyroxen  . OSA on CPAP   . Shortness of breath    with exertion  . Wears glasses     Past Surgical History:  Procedure Laterality Date  . CESAREAN SECTION     times 2  . CHOLECYSTECTOMY    . ENDOMETRIAL ABLATION    . INCISION AND DRAINAGE HIP Left 11/10/2015   Procedure: IRRIGATION AND DEBRIDEMENT LEFT HIP INCISION;  Surgeon: Kathryne Hitchhristopher Y Blackman, MD;  Location: [facility];  Service: Orthopedics;  Laterality: Left;  . JOINT REPLACEMENT  2011   total left knee  . KNEE ARTHROPLASTY  04/23/2012   right   . KNEE ARTHROSCOPY    . ROTATOR CUFF REPAIR     left   . SHOULDER SURGERY     right to repair ligament tear  . TOTAL HIP ARTHROPLASTY Left 09/09/2015   Procedure: LEFT TOTAL HIP ARTHROPLASTY ANTERIOR APPROACH;  Surgeon: Kathryne Hitchhristopher Y Blackman, MD;  Location: [facility];  Service: Orthopedics;  Laterality:  Left;  . TOTAL KNEE ARTHROPLASTY  04/23/2012   Procedure: TOTAL KNEE ARTHROPLASTY;  Surgeon: Nadara MustardMarcus V Duda, MD;  Location: [facility] OR;  Service: Orthopedics;  Laterality: Right;  Right Total Knee Arthroplasty  . TUBAL LIGATION  1996    There were no vitals filed for this visit.  Subjective Assessment - 11/21/17 1323    Subjective  patient arrived in pain    Pertinent History  H/o LBP; spinal arthritis.    Limitations  Walking    How long can you sit comfortably?  20 minutes.    How long can you stand comfortably?  15-20 minutes.    How long can you walk comfortably?  Short distances around house.    Diagnostic tests  MRI:  Please look under "Imaging" tab for details.    Patient Stated Goals  Get around without pain.    Currently in Pain?  Yes    Pain Score  10-Worst pain ever    Pain Location  Back    Pain Orientation  Right    Pain Descriptors / Indicators  Sharp;Discomfort;Aching    Pain Type  Chronic pain    Pain Onset  More than  a month ago    Pain Frequency  Constant    Aggravating Factors   any prolong position    Pain Relieving Factors  moveing                      [facility] Adult PT Treatment/Exercise - 11/21/17 0001      Moist Heat Therapy   Number Minutes Moist Heat  15 Minutes    Moist Heat Location  Lumbar Spine      Electrical Stimulation   Electrical Stimulation Location  low back    Electrical Stimulation Action  premod    Electrical Stimulation Parameters  80-150hz  x22min    Electrical Stimulation Goals  Pain      Ultrasound   Ultrasound Location  low back paraspinals    Ultrasound Parameters  1.5w/cm2/62mhz x60min    Ultrasound Goals  Pain      Manual Therapy   Manual Therapy  Soft tissue mobilization;Myofascial release    Soft tissue mobilization  gentle manual STW to bil low back esp right side to decrease pain and tone             PT Education - 11/21/17 1345    Education provided  Yes    Education Details  posture awareness  techniques    Person(s) Educated  Patient    Methods  Explanation;Demonstration;Handout    Comprehension  Verbalized understanding;Returned demonstration          PT Long Term Goals - 11/21/17 1348      PT LONG TERM GOAL #1   Title  independent with a HEP.    Time  6    Period  Weeks    Status  On-going      PT LONG TERM GOAL #2   Title  Walk a short cummunity distance wiht pain not > 4/10.    Time  6    Period  Weeks    Status  On-going      PT LONG TERM GOAL #3   Title  Eliminate right LE symptoms.    Time  6    Period  Weeks    Status  On-going      PT LONG TERM GOAL #4   Title  Perform ADL's with pain not > 4/10.    Time  6    Period  Weeks    Status  On-going            Plan - 11/21/17 1348    Clinical Impression Statement  Patient tolerated treatment fair due to high pain level. Today educated patient on posture awareness techniques and core activation with HEP provided. Patient has a lot of pain overall and walks with forward flexed posture. Patient has palpable pain on right low back area. Patient current goals ongoing due to pain limitations.     Rehab Potential  Excellent    Clinical Impairments Affecting Rehab Potential  Severity of pain.    PT Frequency  2x / week    PT Duration  6 weeks    PT Treatment/Interventions  ADLs/Self Care Home Management;Cryotherapy;Electrical Stimulation;Ultrasound;Moist Heat;Therapeutic activities;Therapeutic exercise;Patient/family education;Manual techniques;Dry needling    PT Next Visit Plan  cont with POC for ROM and strength/modalities PRN    Consulted and Agree with Plan of Care  Patient       Patient will benefit from skilled therapeutic intervention in order to improve the following deficits and impairments:  Obesity, Decreased activity tolerance, Decreased range of motion,  Difficulty walking, Decreased mobility, Postural dysfunction, Pain  Visit Diagnosis: Acute right-sided low back pain with right-sided  sciatica  Abnormal posture     Problem List Patient Active Problem List   Diagnosis Date Noted  . Pain of right hip joint 07/03/2017  . Acute right-sided low back pain with right-sided sciatica 03/27/2017  . Chronic right shoulder pain 02/13/2017  . History of rotator cuff tear 11/30/2016  . Impingement syndrome of right shoulder 11/30/2016  . Exacerbation of asthma 07/18/2016  . Hypokalemia 07/18/2016  . Hypertension 07/18/2016  . Depression with anxiety 07/18/2016  . Hypothyroidism 07/18/2016  . Hyperglycemia 07/18/2016  . Acute asthma exacerbation 07/18/2016  . Surgical wound dehiscence left hip; questionable superficial infection 11/10/2015  . Left hip postoperative wound infection 11/10/2015  . Osteoarthritis of left hip 09/09/2015  . 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 Obesity (BMI 35.0-39.9 without comorbidity) 04/28/2013    Leya Paige P, PTA 11/21/2017, 2:11 PM  [facility] [address] [place], [place], [address] Phone: [phone]   Fax:  [phone]  Name: Marilyn [name] MRN: [id number] Date of Birth: Jun 27, 1971

## 2017-11-21 NOTE — Patient Instructions (Signed)
Pelvic Tilt: Posterior - Legs Bent (Supine)   Tighten stomach and flatten back by rolling pelvis down and tighten buttock muscles. Hold _10___ seconds. Relax. Repeat _10-30___ times per set. Do __2__ sets per session. Do _2___ sessions per day.  Can perform in any position sitting/standing  Brushing Teeth    Place one foot on ledge and one hand on counter. Bend other knee slightly to keep back straight.  Copyright  VHI. All rights reserved.  Refrigerator   Squat with knees apart to reach lower shelves and drawers.   Copyright  VHI. All rights reserved.  Laundry [employer]   Squat down and hold basket close to stand. Use leg muscles to do the work.   Copyright  VHI. All rights reserved.  Housework - Vacuuming   Hold the vacuum with arm held at side. Step back and forth to move it, keeping head up. Avoid twisting.   Copyright  VHI. All rights reserved.  Housework - Wiping   Position yourself as close as possible to reach work surface. Avoid straining your back.   Copyright  VHI. All rights reserved.  Gardening - Mowing   Keep arms close to sides and walk with lawn mower.   Copyright  VHI. All rights reserved.  Sleeping on Side   Place pillow between knees. Use cervical support under neck and a roll around waist as needed.   Copyright  VHI. All rights reserved.  Log Roll   Lying on back, bend left knee and place left arm across chest. Roll all in one movement to the right. Reverse to roll to the left. Always move as one unit.   Copyright  VHI. All rights reserved.  Stand to Sit / Sit to Stand   To sit: Bend knees to lower self onto front edge of chair, then scoot back on seat. To stand: Reverse sequence by placing one foot forward, and scoot to front of seat. Use rocking motion to stand up.  Copyright  VHI. All rights reserved.  Posture - Standing   Good posture is important. Avoid slouching and forward head thrust. Maintain curve in low  back and align ears over shoul- ders, hips over ankles.   Copyright  VHI. All rights reserved.  Posture - Sitting   Sit upright, head facing forward. Try using a roll to support lower back. Keep shoulders relaxed, and avoid rounded back. Keep hips level with knees. Avoid crossing legs for long periods.   Copyright  VHI. All rights reserved.  Computer Work   Position work to Art gallery managerface forward. Use proper work and seat height. Keep shoulders back and down, wrists straight, and elbows at right angles. Use chair that provides full back support. Add footrest and lumbar roll as needed.   Copyright  VHI. All rights reserved.

## 2017-11-26 ENCOUNTER — Encounter: Payer: Self-pay | Admitting: Physical Therapy

## 2017-11-26 ENCOUNTER — Ambulatory Visit: Payer: Medicare Other | Admitting: Physical Therapy

## 2017-11-26 DIAGNOSIS — R293 Abnormal posture: Secondary | ICD-10-CM

## 2017-11-26 DIAGNOSIS — M5441 Lumbago with sciatica, right side: Secondary | ICD-10-CM

## 2017-11-26 NOTE — Therapy (Signed)
[facility] [address] [place], [place], [address] Phone: [phone]   Fax:  [phone]  Physical Therapy Treatment  Patient Details  Name: Marilyn [name] MRN: [id number] Date of Birth: May 04, 1971 Referring Provider: Gordy Savers MD.   Encounter Date: 11/26/2017  PT End of Session - 11/26/17 1330    Visit Number  3    Number of Visits  12    Date for PT Re-Evaluation  01/14/18    PT Start Time  1320    PT Stop Time  1358    PT Time Calculation (min)  38 min    Activity Tolerance  Treatment limited secondary to medical complications (Comment)    Behavior During Therapy  [facility] for tasks assessed/performed       Past Medical History:  Diagnosis Date  . Anemia    taking iron now. pt states having no current issues 09/02/2015  . Anginal pain (HCC)    pt states experiences chest wall pain pt states related to her asthma   . Anxiety    with MRI's  . Arthritis   . Asthma   . Dizziness   . GERD (gastroesophageal reflux disease)   . Headache(784.0)    HX OF MIGRAINES  . History of bronchitis   . Hypertension   . Hypothyroidism    takes levothyroxen  . OSA on CPAP   . Shortness of breath    with exertion  . Wears glasses     Past Surgical History:  Procedure Laterality Date  . CESAREAN SECTION     times 2  . CHOLECYSTECTOMY    . ENDOMETRIAL ABLATION    . INCISION AND DRAINAGE HIP Left 11/10/2015   Procedure: IRRIGATION AND DEBRIDEMENT LEFT HIP INCISION;  Surgeon: Kathryne Hitch, MD;  [facility];  Service: Orthopedics;  Laterality: Left;  . JOINT REPLACEMENT  2011   total left knee  . KNEE ARTHROPLASTY  04/23/2012   right   . KNEE ARTHROSCOPY    . ROTATOR CUFF REPAIR     left   . SHOULDER SURGERY     right to repair ligament tear  . TOTAL HIP ARTHROPLASTY Left 09/09/2015   Procedure: LEFT TOTAL HIP ARTHROPLASTY ANTERIOR APPROACH;  Surgeon: Kathryne Hitch, MD;  Location: [facility];   Service: Orthopedics;  Laterality: Left;  . TOTAL KNEE ARTHROPLASTY  04/23/2012   Procedure: TOTAL KNEE ARTHROPLASTY;  Surgeon: [name], MD;  [facility]: [facility];  Service: Orthopedics;  Laterality: Right;  Right Total Knee Arthroplasty  . TUBAL LIGATION  1996    There were no vitals filed for this visit.  Subjective Assessment - 11/26/17 1329    Subjective  Patient arrived to clinic with reports of difficulty breathing due to rushing to appointment.    Pertinent History  H/o LBP; spinal arthritis.    Limitations  Walking    How long can you sit comfortably?  20 minutes.    How long can you stand comfortably?  15-20 minutes.    How long can you walk comfortably?  Short distances around house.    Diagnostic tests  MRI:  Please look under "Imaging" tab for details.    Patient Stated Goals  Get around without pain.    Currently in Pain?  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      Precautions   Precautions  None      Restrictions   Weight Bearing Restrictions  No                  [facility] Adult PT Treatment/Exercise - 11/26/17 0001      Modalities   Modalities  Electrical Stimulation;Moist Heat      Moist Heat Therapy   Number Minutes Moist Heat  20 Minutes    Moist Heat Location  Lumbar Spine      Electrical Stimulation   Electrical Stimulation Location  B low back    Electrical Stimulation Action  Pre-Mod    Electrical Stimulation Parameters  80-150 hz x20 min    Electrical Stimulation Goals  Pain      Ultrasound   Ultrasound Location  B lumbar paraspinals    Ultrasound Parameters  1.5 w/cm2, 100%, 1 mhz x10 min    Ultrasound Goals  Pain      Manual Therapy    Manual Therapy  Soft tissue mobilization    Soft tissue mobilization  gentle manual STW to bil low back esp right side to decrease pain and tone                  PT Long Term Goals - 11/21/17 1348      PT LONG TERM GOAL #1   Title  independent with a HEP.    Time  6    Period  Weeks    Status  On-going      PT LONG TERM GOAL #2   Title  Walk a short cummunity distance wiht pain not > 4/10.    Time  6    Period  Weeks    Status  On-going      PT LONG TERM GOAL #3   Title  Eliminate right LE symptoms.    Time  6    Period  Weeks    Status  On-going      PT LONG TERM GOAL #4   Title  Perform ADL's with pain not > 4/10.    Time  6    Period  Weeks    Status  On-going            Plan - 11/26/17 1404    Clinical Impression Statement  Patient tolerated today's treatment fair as she arrived with extreme SOB due to rushing to appointment. Patient ambulated into clinic in trunk flexion. Patient very tender to palpation across the L2 region of lumbar paraspinals bilaterally. Patient's treatment completed in sitting and limited with breathing but able to breath better following diaphragmatic breathing. Normal modalities response noted following removal of the modalities.    Rehab Potential  Excellent    Clinical Impairments Affecting Rehab Potential  Severity of pain.    PT Frequency  2x / week    PT Duration  6 weeks    PT Treatment/Interventions  ADLs/Self Care Home Management;Cryotherapy;Electrical Stimulation;Ultrasound;Moist Heat;Therapeutic activities;Therapeutic exercise;Patient/family education;Manual techniques;Dry needling    PT Next Visit Plan  cont with POC for ROM and strength/modalities PRN    Consulted and Agree with Plan of Care  Patient       Patient  will benefit from skilled therapeutic intervention in order to improve the following deficits and impairments:  Obesity, Decreased activity tolerance, Decreased range of motion, Difficulty walking,  Decreased mobility, Postural dysfunction, Pain  Visit Diagnosis: Acute right-sided low back pain with right-sided sciatica  Abnormal posture     Problem List Patient Active Problem List   Diagnosis Date Noted  . Pain of right hip joint 07/03/2017  . Acute right-sided low back pain with right-sided sciatica 03/27/2017  . Chronic right shoulder pain 02/13/2017  . History of rotator cuff tear 11/30/2016  . Impingement syndrome of right shoulder 11/30/2016  . Exacerbation of asthma 07/18/2016  . Hypokalemia 07/18/2016  . Hypertension 07/18/2016  . Depression with anxiety 07/18/2016  . Hypothyroidism 07/18/2016  . Hyperglycemia 07/18/2016  . Acute asthma exacerbation 07/18/2016  . Surgical wound dehiscence left hip; questionable superficial infection 11/10/2015  . Left hip postoperative wound infection 11/10/2015  . Osteoarthritis of left hip 09/09/2015  . 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 Obesity (BMI 35.0-39.9 without comorbidity) 04/28/2013    Marvell Fuller, PTA 11/26/2017, 2:32 PM  [facility] [address] [place], [place], [address] Phone: [phone]   Fax:  [phone]  Name: Marilyn [name] MRN: [id number] Date of Birth: 1971-07-28

## 2017-11-28 ENCOUNTER — Ambulatory Visit (INDEPENDENT_AMBULATORY_CARE_PROVIDER_SITE_OTHER): Payer: Medicare Other | Admitting: Physical Medicine and Rehabilitation

## 2017-11-28 ENCOUNTER — Encounter (INDEPENDENT_AMBULATORY_CARE_PROVIDER_SITE_OTHER): Payer: Self-pay | Admitting: Physical Medicine and Rehabilitation

## 2017-11-28 ENCOUNTER — Ambulatory Visit (INDEPENDENT_AMBULATORY_CARE_PROVIDER_SITE_OTHER): Payer: Self-pay

## 2017-11-28 ENCOUNTER — Telehealth (INDEPENDENT_AMBULATORY_CARE_PROVIDER_SITE_OTHER): Payer: Self-pay

## 2017-11-28 VITALS — BP 156/98 | HR 71 | Temp 98.7°F

## 2017-11-28 DIAGNOSIS — M5416 Radiculopathy, lumbar region: Secondary | ICD-10-CM | POA: Diagnosis not present

## 2017-11-28 MED ORDER — METHYLPREDNISOLONE ACETATE 80 MG/ML IJ SUSP
80.0000 mg | Freq: Once | INTRAMUSCULAR | Status: AC
  Administered 2017-11-28: 80 mg

## 2017-11-28 NOTE — Patient Instructions (Signed)

## 2017-11-28 NOTE — Progress Notes (Signed)
.  Numeric Pain Rating Scale and Functional Assessment Average Pain 7   In the last MONTH (on 0-10 scale) has pain interfered with the following?  1. General activity like being  able to carry out your everyday physical activities such as walking, climbing stairs, carrying groceries, or moving a chair?  Rating(8)   +Driver, -BT, -Dye Allergies.  

## 2017-11-28 NOTE — Telephone Encounter (Signed)
Patient called stating that she has a appt.at 3:30pm with Dr. Alvester MorinNewton and Rx for Valium has not been sent to her pharmacy.  Cb# is [phone].  Please advise.  Thank you.

## 2017-11-28 NOTE — Telephone Encounter (Signed)
Called into pharmacy and patient notified

## 2017-11-29 ENCOUNTER — Encounter: Payer: Self-pay | Admitting: Physical Therapy

## 2017-11-29 ENCOUNTER — Ambulatory Visit: Payer: Medicare Other | Admitting: Physical Therapy

## 2017-11-29 DIAGNOSIS — R293 Abnormal posture: Secondary | ICD-10-CM

## 2017-11-29 DIAGNOSIS — M5441 Lumbago with sciatica, right side: Secondary | ICD-10-CM | POA: Diagnosis not present

## 2017-11-29 NOTE — Therapy (Signed)
[facility] [address] [place], [place], [address] Phone: [phone]   Fax:  [phone]  Physical Therapy Treatment  Patient Details  Name: Marilyn [name] MRN: [id number] Date of Birth: [birth year] Referring Provider: Gordy Savers MD.   Encounter Date: 11/29/2017  PT End of Session - 11/29/17 1123    Visit Number  4    Number of Visits  12    Date for PT Re-Evaluation  01/14/18    PT Start Time  1126    PT Stop Time  1216    PT Time Calculation (min)  50 min    Activity Tolerance  Patient tolerated treatment well    Behavior During Therapy  [facility] for tasks assessed/performed       Past Medical History:  Diagnosis Date  . Anemia    taking iron now. pt states having no current issues 09/02/2015  . Anginal pain (HCC)    pt states experiences chest wall pain pt states related to her asthma   . Anxiety    with MRI's  . Arthritis   . Asthma   . Dizziness   . GERD (gastroesophageal reflux disease)   . Headache(784.0)    HX OF MIGRAINES  . History of bronchitis   . Hypertension   . Hypothyroidism    takes levothyroxen  . OSA on CPAP   . Shortness of breath    with exertion  . Wears glasses     Past Surgical History:  Procedure Laterality Date  . CESAREAN SECTION     times 2  . CHOLECYSTECTOMY    . ENDOMETRIAL ABLATION    . INCISION AND DRAINAGE HIP Left 11/10/2015   Procedure: IRRIGATION AND DEBRIDEMENT LEFT HIP INCISION;  Surgeon: Kathryne Hitch, MD;  [facility];  Service: Orthopedics;  Laterality: Left;  . JOINT REPLACEMENT  2011   total left knee  . KNEE ARTHROPLASTY  04/23/2012   right   . KNEE ARTHROSCOPY    . ROTATOR CUFF REPAIR     left   . SHOULDER SURGERY     right to repair ligament tear  . TOTAL HIP ARTHROPLASTY Left 09/09/2015   Procedure: LEFT TOTAL HIP ARTHROPLASTY ANTERIOR APPROACH;  Surgeon: Kathryne Hitch, MD;  [facility];  Service: Orthopedics;  Laterality:  Left;  . TOTAL KNEE ARTHROPLASTY  04/23/2012   Procedure: TOTAL KNEE ARTHROPLASTY;  Surgeon: [name], MD;  [facility];  Service: Orthopedics;  Laterality: Right;  Right Total Knee Arthroplasty  . TUBAL LIGATION  1996    There were no vitals filed for this visit.  Subjective Assessment - 11/29/17 1123    Subjective  Had injection yesterday in her low back. Reports that when she got home she experienced a real sharp pain in middle of her low back when she was rising back up from kneeling.    Pertinent History  H/o LBP; spinal arthritis.    Limitations  Walking    How long can you sit comfortably?  20 minutes.    How long can you stand comfortably?  15-20 minutes.    How long can you walk comfortably?  Short distances around house.    Diagnostic tests  MRI:  Please look under "Imaging" tab for details.    Patient Stated Goals  Get around without pain.    Currently in Pain?  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    Next MD Visit  12/11/2017      Precautions   Precautions  None      Restrictions   Weight Bearing Restrictions  No                  [facility] Adult PT Treatment/Exercise - 11/29/17 0001      Modalities   Modalities  Electrical Stimulation;Moist Heat;Ultrasound      Moist Heat Therapy   Number Minutes Moist Heat  15 Minutes    Moist Heat Location  Lumbar Spine      Electrical Stimulation   Electrical Stimulation Location  B low back    Electrical Stimulation Action  Pre-Mod    Electrical Stimulation Parameters  80-150 hz x15 min    Electrical Stimulation Goals  Pain      Ultrasound   Ultrasound Location  B lumbar paraspinals    Ultrasound Parameters  1.5  w/cm2, 100%, 1 mhz x10 min    Ultrasound Goals  Pain      Manual Therapy   Manual Therapy  Soft tissue mobilization    Soft tissue mobilization  gentle manual STW to bil low back esp right side to decrease pain and tone                  PT Long Term Goals - 11/21/17 1348      PT LONG TERM GOAL #1   Title  independent with a HEP.    Time  6    Period  Weeks    Status  On-going      PT LONG TERM GOAL #2   Title  Walk a short cummunity distance wiht pain not > 4/10.    Time  6    Period  Weeks    Status  On-going      PT LONG TERM GOAL #3   Title  Eliminate right LE symptoms.    Time  6    Period  Weeks    Status  On-going      PT LONG TERM GOAL #4   Title  Perform ADL's with pain not > 4/10.    Time  6    Period  Weeks    Status  On-going            Plan - 11/29/17 1223    Clinical Impression Statement  Patient tolerated today's treatment well and arrived standing more erect following injection yesterday. Patient still very tender around L5-S1 region with increased muscle tone around L4-L5 region as well. Patient with improved and more normalized gait pattern following treatment. Normal modalities response noted following removal of the modalities.    Rehab Potential  Excellent    Clinical Impairments Affecting Rehab Potential  Severity of pain.    PT Frequency  2x / week    PT Duration  6 weeks    PT Treatment/Interventions  ADLs/Self Care Home Management;Cryotherapy;Electrical Stimulation;Ultrasound;Moist Heat;Therapeutic activities;Therapeutic exercise;Patient/family education;Manual techniques;Dry needling    PT Next Visit Plan  cont with POC for ROM and strength/modalities PRN    PT Home Exercise Plan  Begin  wihHMP, electrical stimulation; U/S and light STW/M.  Start with draw-in progression.    Consulted and Agree with Plan of Care  Patient       Patient will benefit from skilled therapeutic intervention in order to improve the following deficits  and impairments:  Obesity, Decreased activity tolerance, Decreased range of motion, Difficulty walking, Decreased mobility, Postural dysfunction, Pain  Visit Diagnosis: Acute right-sided low back pain with right-sided sciatica  Abnormal posture     Problem List Patient Active Problem List   Diagnosis Date Noted  . Pain of right hip joint 07/03/2017  . Acute right-sided low back pain with right-sided sciatica 03/27/2017  . Chronic right shoulder pain 02/13/2017  . History of rotator cuff tear 11/30/2016  . Impingement syndrome of right shoulder 11/30/2016  . Exacerbation of asthma 07/18/2016  . Hypokalemia 07/18/2016  . Hypertension 07/18/2016  . Depression with anxiety 07/18/2016  . Hypothyroidism 07/18/2016  . Hyperglycemia 07/18/2016  . Acute asthma exacerbation 07/18/2016  . Surgical wound dehiscence left hip; questionable superficial infection 11/10/2015  . Left hip postoperative wound infection 11/10/2015  . Osteoarthritis of left hip 09/09/2015  . 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 Obesity (BMI 35.0-39.9 without comorbidity) 04/28/2013    Marvell FullerKelsey P Burma Ketcher, PTA 11/29/2017, 12:28 PM  [facility] [address] [place], [place], [address] Phone: [phone]   Fax:  [phone]  Name: Marilyn [name] MRN: [id number] Date of Birth: 01/04/1971

## 2017-12-02 ENCOUNTER — Encounter: Payer: Medicare Other | Admitting: Physical Therapy

## 2017-12-04 ENCOUNTER — Encounter: Payer: Self-pay | Admitting: Physical Therapy

## 2017-12-04 ENCOUNTER — Ambulatory Visit: Payer: Medicare Other | Admitting: Physical Therapy

## 2017-12-04 DIAGNOSIS — R293 Abnormal posture: Secondary | ICD-10-CM

## 2017-12-04 DIAGNOSIS — M5441 Lumbago with sciatica, right side: Secondary | ICD-10-CM | POA: Diagnosis not present

## 2017-12-04 NOTE — Therapy (Signed)
[facility] [address] [place], [place], [address] Phone: [phone]   Fax:  [phone]  Physical Therapy Treatment  Patient Details  Name: Marilyn [name] MRN: [id number] Date of Birth: 1971-06-06 Referring Provider: Gordy Savers MD.   Encounter Date: 12/04/2017  PT End of Session - 12/04/17 1558    Visit Number  5    Number of Visits  12    Date for PT Re-Evaluation  01/14/18    PT Start Time  0205    PT Stop Time  0245    PT Time Calculation (min)  40 min       Past Medical History:  Diagnosis Date  . Anemia    taking iron now. pt states having no current issues 09/02/2015  . Anginal pain (HCC)    pt states experiences chest wall pain pt states related to her asthma   . Anxiety    with MRI's  . Arthritis   . Asthma   . Dizziness   . GERD (gastroesophageal reflux disease)   . Headache(784.0)    HX OF MIGRAINES  . History of bronchitis   . Hypertension   . Hypothyroidism    takes levothyroxen  . OSA on CPAP   . Shortness of breath    with exertion  . Wears glasses     Past Surgical History:  Procedure Laterality Date  . CESAREAN SECTION     times 2  . CHOLECYSTECTOMY    . ENDOMETRIAL ABLATION    . INCISION AND DRAINAGE HIP Left 11/10/2015   Procedure: IRRIGATION AND DEBRIDEMENT LEFT HIP INCISION;  Surgeon: Kathryne Hitch, MD;  [facility];  Service: Orthopedics;  Laterality: Left;  . JOINT REPLACEMENT  2011   total left knee  . KNEE ARTHROPLASTY  04/23/2012   right   . KNEE ARTHROSCOPY    . ROTATOR CUFF REPAIR     left   . SHOULDER SURGERY     right to repair ligament tear  . TOTAL HIP ARTHROPLASTY Left 09/09/2015   Procedure: LEFT TOTAL HIP ARTHROPLASTY ANTERIOR APPROACH;  Surgeon: Kathryne Hitch, MD;  [facility];  Service: Orthopedics;  Laterality: Left;  . TOTAL KNEE ARTHROPLASTY  04/23/2012   Procedure: TOTAL KNEE ARTHROPLASTY;  Surgeon: [name], MD;   [facility];  Service: Orthopedics;  Laterality: Right;  Right Total Knee Arthroplasty  . TUBAL LIGATION  1996    There were no vitals filed for this visit.  Subjective Assessment - 12/04/17 1558    Subjective  I'm having a good day.    Pain Score  4     Pain Location  Back    Pain Orientation  Lower    Pain Descriptors / Indicators  Discomfort    Pain Type  Chronic pain    Pain Onset  More than a month ago                      [facility] Adult PT Treatment/Exercise - 12/04/17 0001      Modalities   Modalities  Electrical Stimulation;Moist Heat      Moist Heat Therapy   Number Minutes Moist Heat  10 Minutes    Moist Heat Location  Lumbar Spine      Electrical Stimulation   Electrical Stimulation Location  Bilateral lower lumbar region    Electrical Stimulation Action  Pre-mod    Electrical Stimulation Parameters  80-150 Hz x 10 minutes.  Electrical Stimulation Goals  Pain      Ultrasound   Ultrasound Location  Lower lumbar    Ultrasound Parameters  Combo e'stim/U/S at 1.50 W/CM2 x 12 minutes.    Ultrasound Goals  Pain      Manual Therapy   Manual Therapy  Soft tissue mobilization    Soft tissue mobilization  STW/M x 11 minutes to bilateral lower lumbar paraspinal musculature.                    PT Long Term Goals - 11/21/17 1348      PT LONG TERM GOAL #1   Title  independent with a HEP.    Time  6    Period  Weeks    Status  On-going      PT LONG TERM GOAL #2   Title  Walk a short cummunity distance wiht pain not > 4/10.    Time  6    Period  Weeks    Status  On-going      PT LONG TERM GOAL #3   Title  Eliminate right LE symptoms.    Time  6    Period  Weeks    Status  On-going      PT LONG TERM GOAL #4   Title  Perform ADL's with pain not > 4/10.    Time  6    Period  Weeks    Status  On-going            Plan - 12/04/17 1604    Clinical Impression Statement  Patient did well today.  Patient able to tolerate STW/M  with increased pressure today.  She was extremely tender at the time of her evaluation even with light palpation.    PT Treatment/Interventions  ADLs/Self Care Home Management;Cryotherapy;Electrical Stimulation;Ultrasound;Moist Heat;Therapeutic activities;Therapeutic exercise;Patient/family education;Manual techniques;Dry needling    PT Next Visit Plan  cont with POC for ROM and strength/modalities PRN    PT Home Exercise Plan  Begin wihHMP, electrical stimulation; U/S and light STW/M.  Start with draw-in progression.    Consulted and Agree with Plan of Care  Patient       Patient will benefit from skilled therapeutic intervention in order to improve the following deficits and impairments:  Obesity, Decreased activity tolerance, Decreased range of motion, Difficulty walking, Decreased mobility, Postural dysfunction, Pain  Visit Diagnosis: Acute right-sided low back pain with right-sided sciatica  Abnormal posture     Problem List Patient Active Problem List   Diagnosis Date Noted  . Pain of right hip joint 07/03/2017  . Acute right-sided low back pain with right-sided sciatica 03/27/2017  . Chronic right shoulder pain 02/13/2017  . History of rotator cuff tear 11/30/2016  . Impingement syndrome of right shoulder 11/30/2016  . Exacerbation of asthma 07/18/2016  . Hypokalemia 07/18/2016  . Hypertension 07/18/2016  . Depression with anxiety 07/18/2016  . Hypothyroidism 07/18/2016  . Hyperglycemia 07/18/2016  . Acute asthma exacerbation 07/18/2016  . Surgical wound dehiscence left hip; questionable superficial infection 11/10/2015  . Left hip postoperative wound infection 11/10/2015  . Osteoarthritis of left hip 09/09/2015  . 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 Obesity (BMI 35.0-39.9 without comorbidity) 04/28/2013    Navdeep Halt, [place] MPT 12/04/2017, 4:07 PM  [facility] [address] [place], [place],  [id number] Phone: [phone]   Fax:  [phone]  Name: Donley Redderhelma M Cancio MRN: [id number] Date of Birth: Jul 04, 1971

## 2017-12-09 ENCOUNTER — Ambulatory Visit: Payer: Medicare Other | Admitting: Physical Therapy

## 2017-12-09 ENCOUNTER — Encounter: Payer: Self-pay | Admitting: Physical Therapy

## 2017-12-09 DIAGNOSIS — R293 Abnormal posture: Secondary | ICD-10-CM

## 2017-12-09 DIAGNOSIS — M5441 Lumbago with sciatica, right side: Secondary | ICD-10-CM

## 2017-12-09 NOTE — Therapy (Signed)
[facility] [address] [place], [place], [address] Phone: [phone]   Fax:  [phone]  Physical Therapy Treatment  Patient Details  Name: Marilyn [name] MRN: [id number] Date of Birth: [birth year] Referring Provider: Gordy Savers MD.   Encounter Date: 12/09/2017  PT End of Session - 12/09/17 1454    Visit Number  6    Number of Visits  12    Date for PT Re-Evaluation  01/14/18    PT Start Time  1458    PT Stop Time  1540    PT Time Calculation (min)  42 min    Activity Tolerance  Patient tolerated treatment well    Behavior During Therapy  [facility] for tasks assessed/performed       Past Medical History:  Diagnosis Date  . Anemia    taking iron now. pt states having no current issues 09/02/2015  . Anginal pain (HCC)    pt states experiences chest wall pain pt states related to her asthma   . Anxiety    with MRI's  . Arthritis   . Asthma   . Dizziness   . GERD (gastroesophageal reflux disease)   . Headache(784.0)    HX OF MIGRAINES  . History of bronchitis   . Hypertension   . Hypothyroidism    takes levothyroxen  . OSA on CPAP   . Shortness of breath    with exertion  . Wears glasses     Past Surgical History:  Procedure Laterality Date  . CESAREAN SECTION     times 2  . CHOLECYSTECTOMY    . ENDOMETRIAL ABLATION    . INCISION AND DRAINAGE HIP Left 11/10/2015   Procedure: IRRIGATION AND DEBRIDEMENT LEFT HIP INCISION;  Surgeon: Kathryne Hitch, MD;  Location: [facility] OR;  Service: Orthopedics;  Laterality: Left;  . JOINT REPLACEMENT  2011   total left knee  . KNEE ARTHROPLASTY  04/23/2012   right   . KNEE ARTHROSCOPY    . ROTATOR CUFF REPAIR     left   . SHOULDER SURGERY     right to repair ligament tear  . TOTAL HIP ARTHROPLASTY Left 09/09/2015   Procedure: LEFT TOTAL HIP ARTHROPLASTY ANTERIOR APPROACH;  Surgeon: Kathryne Hitch, MD;  Location: [facility];  Service: Orthopedics;  Laterality:  Left;  . TOTAL KNEE ARTHROPLASTY  04/23/2012   Procedure: TOTAL KNEE ARTHROPLASTY;  Surgeon: [name], MD;  Location: [facility] OR;  Service: Orthopedics;  Laterality: Right;  Right Total Knee Arthroplasty  . TUBAL LIGATION  1996    There were no vitals filed for this visit.  Subjective Assessment - 12/09/17 1453    Subjective  Reports that she is moving slow but relates that to rain coming in.    Pertinent History  H/o LBP; spinal arthritis.    Limitations  Walking    How long can you sit comfortably?  20 minutes.    How long can you stand comfortably?  15-20 minutes.    How long can you walk comfortably?  Short distances around house.    Diagnostic tests  MRI:  Please look under "Imaging" tab for details.    Patient Stated Goals  Get around without pain.    Currently in Pain?  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    Next MD Visit  12/11/2017      Precautions   Precautions  None      Restrictions   Weight Bearing Restrictions  No            No data recorded       [facility] Adult PT Treatment/Exercise - 12/09/17 0001      Modalities   Modalities  Electrical Stimulation;Moist Heat;Ultrasound      Moist Heat Therapy   Number Minutes Moist Heat  15 Minutes    Moist Heat Location  Lumbar Spine      Electrical Stimulation   Electrical Stimulation Location  B low back    Electrical Stimulation Action  Pre-Mod    Electrical Stimulation Parameters  80-150 hz x15 min    Electrical Stimulation Goals  Pain      Ultrasound   Ultrasound Location  B lumbar paraspinals     Ultrasound Parameters  1.5 w/cm2, 100%,1 mhz x10 min    Ultrasound Goals  Pain      Manual Therapy   Manual Therapy  Soft tissue mobilization    Soft tissue mobilization  STW to B lumbar paraspinals/ QL region to reduce muscle tightness and pain                  PT Long Term Goals - 11/21/17 1348      PT LONG TERM GOAL #1   Title  independent with a HEP.    Time  6    Period  Weeks    Status  On-going      PT LONG TERM GOAL #2   Title  Walk a short cummunity distance wiht pain not > 4/10.    Time  6    Period  Weeks    Status  On-going      PT LONG TERM GOAL #3   Title  Eliminate right LE symptoms.    Time  6    Period  Weeks    Status  On-going      PT LONG TERM GOAL #4   Title  Perform ADL's with pain not > 4/10.    Time  6    Period  Weeks    Status  On-going            Plan - 12/09/17 1556    Clinical Impression Statement  Patient tolerated today's treatment well although still in high level LBP and minimally sensitive to manual therapy of the L lumbar paraspinals/ QL but B sensitive to B SI region. Patient has not recieved continuing relief from latest injection and only had relief the day of the injection. No goal progression at this time secondary to pain. Normal modalities response noted following removal of the modalities.    Rehab Potential  Excellent    Clinical Impairments Affecting Rehab Potential  Severity of pain.    PT Frequency  2x / week    PT Duration  6 weeks    PT Treatment/Interventions  ADLs/Self Care Home Management;Cryotherapy;Electrical Stimulation;Ultrasound;Moist Heat;Therapeutic activities;Therapeutic exercise;Patient/family education;Manual techniques;Dry needling    PT Next Visit  Plan  Continue per MD discretion.    Consulted and Agree with Plan of Care  Patient       Patient will benefit from skilled therapeutic intervention in order to improve the following deficits and impairments:  Obesity, Decreased activity  tolerance, Decreased range of motion, Difficulty walking, Decreased mobility, Postural dysfunction, Pain  Visit Diagnosis: Acute right-sided low back pain with right-sided sciatica  Abnormal posture     Problem List Patient Active Problem List   Diagnosis Date Noted  . Pain of right hip joint 07/03/2017  . Acute right-sided low back pain with right-sided sciatica 03/27/2017  . Chronic right shoulder pain 02/13/2017  . History of rotator cuff tear 11/30/2016  . Impingement syndrome of right shoulder 11/30/2016  . Exacerbation of asthma 07/18/2016  . Hypokalemia 07/18/2016  . Hypertension 07/18/2016  . Depression with anxiety 07/18/2016  . Hypothyroidism 07/18/2016  . Hyperglycemia 07/18/2016  . Acute asthma exacerbation 07/18/2016  . Surgical wound dehiscence left hip; questionable superficial infection 11/10/2015  . Left hip postoperative wound infection 11/10/2015  . Osteoarthritis of left hip 09/09/2015  . 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 Obesity (BMI 35.0-39.9 without comorbidity) 04/28/2013    Marvell FullerKelsey P Pinchos Topel, PTA 12/09/17 4:01 PM   [facility] [address] [place], [place], [address] Phone: [phone]   Fax:  [phone]  Name: Marilyn [name] MRN: [id number] Date of Birth: 1971-04-23

## 2017-12-11 ENCOUNTER — Ambulatory Visit (INDEPENDENT_AMBULATORY_CARE_PROVIDER_SITE_OTHER): Payer: Medicare Other | Admitting: Orthopaedic Surgery

## 2017-12-11 ENCOUNTER — Encounter (INDEPENDENT_AMBULATORY_CARE_PROVIDER_SITE_OTHER): Payer: Self-pay | Admitting: Orthopaedic Surgery

## 2017-12-11 VITALS — Ht 63.0 in | Wt 322.0 lb

## 2017-12-11 DIAGNOSIS — G8929 Other chronic pain: Secondary | ICD-10-CM | POA: Diagnosis not present

## 2017-12-11 DIAGNOSIS — M5441 Lumbago with sciatica, right side: Secondary | ICD-10-CM

## 2017-12-11 NOTE — Progress Notes (Signed)
The patient is continue to have issues with right-sided low back pain with radicular symptoms going down her legs.  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  She is morbidly obese as well.  She is tried multiple injections by Dr. Alvester MorinNewton and is going to physical therapy.  She says she would just like some type of relief of the pain.  On examination she does have significant truncal obesity.  She has pain in the lumbar spine to the right and left sides.  It seems to radiate down more of the right leg.  There is no gross weakness with her subjective numbness and tingling down the foot.  An MRI done in August of last year showed moderately severe right foraminal narrowing at L4-L5 that are worsened from previous MRI comparisons.  At this point on loss of what else to try.  We can certainly send her for a surgical evaluation by a spine specialist but I gave her no guarantees that they would consider surgery on her based on her obesity.  She understands this well.  She will continue physical therapy while we await to get a referral to Dr. Otelia SergeantNitka.

## 2017-12-16 ENCOUNTER — Ambulatory Visit: Payer: Medicare Other | Attending: Orthopaedic Surgery | Admitting: Physical Therapy

## 2017-12-16 ENCOUNTER — Encounter: Payer: Self-pay | Admitting: Physical Therapy

## 2017-12-16 DIAGNOSIS — R293 Abnormal posture: Secondary | ICD-10-CM

## 2017-12-16 DIAGNOSIS — M5441 Lumbago with sciatica, right side: Secondary | ICD-10-CM | POA: Diagnosis present

## 2017-12-16 NOTE — Therapy (Signed)
[facility] [address] [place], [place], [address] Phone: [phone]   Fax:  [phone]  Physical Therapy Treatment  Patient Details  Name: Marilyn [name] MRN: [id number] Date of Birth: 1971-05-14 Referring Provider: Gordy Savershristopher Balckman MD.   Encounter Date: 12/16/2017  PT End of Session - 12/16/17 1445    Visit Number  7    Number of Visits  12    Date for PT Re-Evaluation  01/14/18    PT Start Time  1441    PT Stop Time  1525    PT Time Calculation (min)  44 min    Activity Tolerance  Patient tolerated treatment well    Behavior During Therapy  [facility] for tasks assessed/performed       Past Medical History:  Diagnosis Date  . Anemia    taking iron now. pt states having no current issues 09/02/2015  . Anginal pain (HCC)    pt states experiences chest wall pain pt states related to her asthma   . Anxiety    with MRI's  . Arthritis   . Asthma   . Dizziness   . GERD (gastroesophageal reflux disease)   . Headache(784.0)    HX OF MIGRAINES  . History of bronchitis   . Hypertension   . Hypothyroidism    takes levothyroxen  . OSA on CPAP   . Shortness of breath    with exertion  . Wears glasses     Past Surgical History:  Procedure Laterality Date  . CESAREAN SECTION     times 2  . CHOLECYSTECTOMY    . ENDOMETRIAL ABLATION    . INCISION AND DRAINAGE HIP Left 11/10/2015   Procedure: IRRIGATION AND DEBRIDEMENT LEFT HIP INCISION;  Surgeon: Kathryne Hitchhristopher Y Blackman, MD;  Location: [facility];  Service: Orthopedics;  Laterality: Left;  . JOINT REPLACEMENT  2011   total left knee  . KNEE ARTHROPLASTY  04/23/2012   right   . KNEE ARTHROSCOPY    . ROTATOR CUFF REPAIR     left   . SHOULDER SURGERY     right to repair ligament tear  . TOTAL HIP ARTHROPLASTY Left 09/09/2015   Procedure: LEFT TOTAL HIP ARTHROPLASTY ANTERIOR APPROACH;  Surgeon: Kathryne Hitchhristopher Y Blackman, MD;  Location: [facility];  Service: Orthopedics;  Laterality:  Left;  . TOTAL KNEE ARTHROPLASTY  04/23/2012   Procedure: TOTAL KNEE ARTHROPLASTY;  Surgeon: Nadara MustardMarcus V Duda, MD;  Location: [facility] OR;  Service: Orthopedics;  Laterality: Right;  Right Total Knee Arthroplasty  . TUBAL LIGATION  1996    There were no vitals filed for this visit.  Subjective Assessment - 12/16/17 1442    Subjective  No improvement thus far    Pertinent History  H/o LBP; spinal arthritis.    Limitations  Walking    How long can you sit comfortably?  20 minutes.    How long can you stand comfortably?  15-20 minutes.    How long can you walk comfortably?  Short distances around house.    Diagnostic tests  MRI:  Please look under "Imaging" tab for details.    Patient Stated Goals  Get around without pain.    Currently in Pain?  Yes    Pain Score  7     Pain Location  Back    Pain Orientation  Lower;Right    Pain Descriptors / Indicators  Discomfort    Pain Type  Chronic pain    Pain Onset  More than a  month ago    Pain Frequency  Constant    Aggravating Factors   prolong standing or activity    Pain Relieving Factors  movement out of position                       [facility] Adult PT Treatment/Exercise - 12/16/17 0001      Moist Heat Therapy   Number Minutes Moist Heat  15 Minutes    Moist Heat Location  Lumbar Spine      Electrical Stimulation   Electrical Stimulation Location  B low back    Electrical Stimulation Action  premod    Electrical Stimulation Parameters  80+-150hz  x1min    Electrical Stimulation Goals  Pain      Ultrasound   Ultrasound Location  b lumbar paraspinals    Ultrasound Parameters  1.5w/cm2/100%/74mz x36min    Ultrasound Goals  Pain      Manual Therapy   Manual Therapy  Soft tissue mobilization    Soft tissue mobilization  STW to B lumbar paraspinals/ QL region to reduce muscle tightness and pain                  PT Long Term Goals - 11/21/17 1348      PT LONG TERM GOAL #1   Title  independent with a HEP.    Time   6    Period  Weeks    Status  On-going      PT LONG TERM GOAL #2   Title  Walk a short cummunity distance wiht pain not > 4/10.    Time  6    Period  Weeks    Status  On-going      PT LONG TERM GOAL #3   Title  Eliminate right LE symptoms.    Time  6    Period  Weeks    Status  On-going      PT LONG TERM GOAL #4   Title  Perform ADL's with pain not > 4/10.    Time  6    Period  Weeks    Status  On-going            Plan - 12/16/17 1446    Clinical Impression Statement  Patient tolerated treatment well today. Patient reported no improvement with symptoms yet no worse. Patient has palpable pain bil QL and some tighness in muscles in low back. Patient has reported she will cont thaerapy and will be seeing a new MD in a few weeks. Goals ongoing at this time.     Rehab Potential  Excellent    Clinical Impairments Affecting Rehab Potential  Severity of pain.    PT Frequency  2x / week    PT Duration  6 weeks    PT Treatment/Interventions  ADLs/Self Care Home Management;Cryotherapy;Electrical Stimulation;Ultrasound;Moist Heat;Therapeutic activities;Therapeutic exercise;Patient/family education;Manual techniques;Dry needling    PT Next Visit Plan  cont with POC    Consulted and Agree with Plan of Care  Patient       Patient will benefit from skilled therapeutic intervention in order to improve the following deficits and impairments:  Obesity, Decreased activity tolerance, Decreased range of motion, Difficulty walking, Decreased mobility, Postural dysfunction, Pain  Visit Diagnosis: Acute right-sided low back pain with right-sided sciatica  Abnormal posture     Problem List Patient Active Problem List   Diagnosis Date Noted  . Pain of right hip joint 07/03/2017  . Acute right-sided low back  pain with right-sided sciatica 03/27/2017  . Chronic right shoulder pain 02/13/2017  . History of rotator cuff tear 11/30/2016  . Impingement syndrome of right shoulder 11/30/2016   . Exacerbation of asthma 07/18/2016  . Hypokalemia 07/18/2016  . Hypertension 07/18/2016  . Depression with anxiety 07/18/2016  . Hypothyroidism 07/18/2016  . Hyperglycemia 07/18/2016  . Acute asthma exacerbation 07/18/2016  . Surgical wound dehiscence left hip; questionable superficial infection 11/10/2015  . Left hip postoperative wound infection 11/10/2015  . Osteoarthritis of left hip 09/09/2015  . 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 Obesity (BMI 35.0-39.9 without comorbidity) 04/28/2013    Fusako Tanabe P, PTA 12/16/2017, 3:25 PM  [facility] [address] [place], [place], [address] Phone: [phone]   Fax:  [phone]  Name: Marilyn [name] MRN: [id number] Date of Birth: 06-12-1971

## 2017-12-18 ENCOUNTER — Ambulatory Visit: Payer: Medicare Other | Admitting: Physical Therapy

## 2017-12-18 ENCOUNTER — Encounter: Payer: Self-pay | Admitting: Physical Therapy

## 2017-12-18 DIAGNOSIS — M5441 Lumbago with sciatica, right side: Secondary | ICD-10-CM

## 2017-12-18 DIAGNOSIS — R293 Abnormal posture: Secondary | ICD-10-CM

## 2017-12-18 NOTE — Procedures (Signed)
Lumbar Epidural Steroid Injection - Interlaminar Approach with Fluoroscopic Guidance  Patient: Marilyn [name]      Date of Birth: Nov 26, 1970 MRN: [id number] PCP: Joette CatchingNyland, Leonard, MD      Visit Date: 11/28/2017   Universal Protocol:     Consent Given By: the patient  Position: PRONE  Additional Comments: Vital signs were monitored before and after the procedure. Patient was prepped and draped in the usual sterile fashion. The correct patient, procedure, and site was verified.   Injection Procedure Details:  Procedure Site One Meds Administered:  Meds ordered this encounter  Medications  . methylPREDNISolone acetate (DEPO-MEDROL) injection 80 mg     Laterality: Right  Location/Site:  L4-L5  Needle size: 20 G  Needle type: Tuohy  Needle Placement: Paramedian epidural  Findings:   -Comments: Excellent flow of contrast into the epidural space.  Procedure Details: Using a paramedian approach from the side mentioned above, the region overlying the inferior lamina was localized under fluoroscopic visualization and the soft tissues overlying this structure were infiltrated with 4 ml. of 1% Lidocaine without Epinephrine. The Tuohy needle was inserted into the epidural space using a paramedian approach.   The epidural space was localized using loss of resistance along with lateral and bi-planar fluoroscopic views.  After negative aspirate for air, blood, and CSF, a 2 ml. volume of Isovue-250 was injected into the epidural space and the flow of contrast was observed. Radiographs were obtained for documentation purposes.    The injectate was administered into the level noted above.   Additional Comments:  No complications occurred Dressing: Band-Aid    Post-procedure details: Patient was observed during the procedure. Post-procedure instructions were reviewed.  Patient left the clinic in stable condition.

## 2017-12-18 NOTE — Progress Notes (Signed)
Marilyn [name] - 47 y.o. female MRN [id number]  Date of birth: 1971-07-03  Office Visit Note: Visit Date: 11/28/2017 PCP: Joette Catching, MD Referred by: Joette Catching, MD  Subjective: Chief Complaint  Patient presents with  . Lower Back - Pain  . Right Leg - Pain  . Left Leg - Pain   HPI: Marilyn [name] is a 47 year old female followed by Dr. Magnus Ivan in our office for orthopedic care.  We have seen on a few occasions for facet joint block and epidural injection.  She has MRI findings of severe facet arthropathy at L4-5 and L5-S1 with by foraminal and foraminal narrowing quite severe.  She has complaints of low back pain and bilateral radicular pain which are always present and have really not been relieved with medications or injections for any length of time.  She has gotten relief with the injections but it seems to be fairly short-lived basis.  Depending on relief we get today with epidural injection she likely is going to need surgical consultation at some point.  Alternatively she could seek comprehensive pain management as she does have significant drug allergies and interactions.   ROS Otherwise per HPI.  Assessment & Plan: Visit Diagnoses:  1. Lumbar radiculopathy     Plan: No additional findings.   Meds & Orders:  Meds ordered this encounter  Medications  . methylPREDNISolone acetate (DEPO-MEDROL) injection 80 mg    Orders Placed This Encounter  Procedures  . XR C-ARM NO REPORT  . Epidural Steroid injection    Follow-up: Return if symptoms worsen or fail to improve, for Dr. Magnus Ivan.   Procedures: No procedures performed  Lumbar Epidural Steroid Injection - Interlaminar Approach with Fluoroscopic Guidance  Patient: Marilyn [name]      Date of Birth: 28-Feb-1971 MRN: [id number] PCP: Joette Catching, MD      Visit Date: 11/28/2017   Universal Protocol:     Consent Given By: the patient  Position: PRONE  Additional Comments: Vital signs were  monitored before and after the procedure. Patient was prepped and draped in the usual sterile fashion. The correct patient, procedure, and site was verified.   Injection Procedure Details:  Procedure Site One Meds Administered:  Meds ordered this encounter  Medications  . methylPREDNISolone acetate (DEPO-MEDROL) injection 80 mg     Laterality: Right  Location/Site:  L4-L5  Needle size: 20 G  Needle type: Tuohy  Needle Placement: Paramedian epidural  Findings:   -Comments: Excellent flow of contrast into the epidural space.  Procedure Details: Using a paramedian approach from the side mentioned above, the region overlying the inferior lamina was localized under fluoroscopic visualization and the soft tissues overlying this structure were infiltrated with 4 ml. of 1% Lidocaine without Epinephrine. The Tuohy needle was inserted into the epidural space using a paramedian approach.   The epidural space was localized using loss of resistance along with lateral and bi-planar fluoroscopic views.  After negative aspirate for air, blood, and CSF, a 2 ml. volume of Isovue-250 was injected into the epidural space and the flow of contrast was observed. Radiographs were obtained for documentation purposes.    The injectate was administered into the level noted above.   Additional Comments:  No complications occurred Dressing: Band-Aid    Post-procedure details: Patient was observed during the procedure. Post-procedure instructions were reviewed.  Patient left the clinic in stable condition.   Clinical History: MRI LUMBAR SPINE WITHOUT CONTRAST  TECHNIQUE: Multiplanar, multisequence MR imaging of the lumbar spine  was performed. No intravenous contrast was administered.  COMPARISON:  Plain films lumbar spine 11/16/2016. MRI lumbar spine 03/17/2014. This examination is incomplete. No axial imaging is available.  FINDINGS: Segmentation:  Standard.  Alignment:   Maintained.  Vertebrae:  No fracture or worrisome lesion.  Conus medullaris: Extends to the L1 level and appears normal.  Paraspinal and other soft tissues: Negative.  Disc levels:  T11-12 is imaged in the sagittal plane only. Shallow central protrusion at this level without stenosis is identified.  T12-L1:  Negative.  L1-2:  Negative.  L2-3:  Negative.  L3-4: Mild facet degenerative change. There is some ligamentum flavum thickening. No disc bulge or protrusion. The central canal and foramina are open.  L4-5: Bilateral facet degenerative change with associated effusions. There is a shallow disc bulge without central canal stenosis. Moderately severe right foraminal narrowing appears worse than on the prior examination. There is moderate left foraminal narrowing.  L5-S1: Advanced bilateral facet degenerative disease. Shallow disc bulge without central canal stenosis. Moderate to moderately severe foraminal narrowing at this level is worse on the right and unchanged in appearance.  IMPRESSION: Spondylosis appearing worst at L4-5 where there is moderately severe right foraminal narrowing, worse than on the prior examination. Facet arthropathy and new bilateral facet joint effusions are present this level. The central canal is open.  Advanced bilateral facet degenerative disease L5-S1. Moderate to moderately severe bilateral foraminal narrowing at this level is unchanged.   Electronically Signed   By: Drusilla Kannerhomas  Dalessio M.D.   On: 04/19/2017 19:13   She reports that she quit smoking about 26 years ago. She has a 2.00 pack-year smoking history. She has never used smokeless tobacco. No results for input(s): HGBA1C, LABURIC in the last 8760 hours.  Objective:  VS:  HT:    WT:   BMI:     BP:(!) 156/98  HR:71bpm  TEMP:98.7 F (37.1 C)(Oral)  RESP:96 % Physical Exam  Ortho Exam Imaging: No results found.  Past Medical/Family/Surgical/Social  History: Medications & Allergies reviewed per EMR, new medications updated. Patient Active Problem List   Diagnosis Date Noted  . Pain of right hip joint 07/03/2017  . Acute right-sided low back pain with right-sided sciatica 03/27/2017  . Chronic right shoulder pain 02/13/2017  . History of rotator cuff tear 11/30/2016  . Impingement syndrome of right shoulder 11/30/2016  . Exacerbation of asthma 07/18/2016  . Hypokalemia 07/18/2016  . Hypertension 07/18/2016  . Depression with anxiety 07/18/2016  . Hypothyroidism 07/18/2016  . Hyperglycemia 07/18/2016  . Acute asthma exacerbation 07/18/2016  . Surgical wound dehiscence left hip; questionable superficial infection 11/10/2015  . Left hip postoperative wound infection 11/10/2015  . Osteoarthritis of left hip 09/09/2015  . 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 Obesity (BMI 35.0-39.9 without comorbidity) 04/28/2013   Past Medical History:  Diagnosis Date  . Anemia    taking iron now. pt states having no current issues 09/02/2015  . Anginal pain (HCC)    pt states experiences chest wall pain pt states related to her asthma   . Anxiety    with MRI's  . Arthritis   . Asthma   . Dizziness   . GERD (gastroesophageal reflux disease)   . Headache(784.0)    HX OF MIGRAINES  . History of bronchitis   . Hypertension   . Hypothyroidism    takes levothyroxen  . OSA on CPAP   . Shortness of breath    with exertion  . Wears glasses  Family History  Problem Relation Age of Onset  . Cancer Mother        colon  . Epilepsy Mother   . Cancer Father        prostate  . Diabetes Father   . Hypertension Father   . Hypertension Maternal Aunt   . Diabetes Maternal Aunt   . Hypertension Paternal Aunt    Past Surgical History:  Procedure Laterality Date  . CESAREAN SECTION     times 2  . CHOLECYSTECTOMY    . ENDOMETRIAL ABLATION    . INCISION AND DRAINAGE HIP Left 11/10/2015   Procedure: IRRIGATION AND  DEBRIDEMENT LEFT HIP INCISION;  Surgeon: Kathryne Hitch, MD;  Location[facility];  Service: Orthopedics;  Laterality: Left;  . JOINT REPLACEMENT  2011   total left knee  . KNEE ARTHROPLASTY  04/23/2012   right   . KNEE ARTHROSCOPY    . ROTATOR CUFF REPAIR     left   . SHOULDER SURGERY     right to repair ligament tear  . TOTAL HIP ARTHROPLASTY Left 09/09/2015   Procedure: LEFT TOTAL HIP ARTHROPLASTY ANTERIOR APPROACH;  Surgeon: Kathryne Hitch, MD;  Location: [facility];  Service: Orthopedics;  Laterality: Left;  . TOTAL KNEE ARTHROPLASTY  04/23/2012   Procedure: TOTAL KNEE ARTHROPLASTY;  Surgeon: [name], MD;  Location: [facility];  Service: Orthopedics;  Laterality: Right;  Right Total Knee Arthroplasty  . TUBAL LIGATION  1996   Social History   Occupational History  . Not on file  Tobacco Use  . Smoking status: Former Smoker    Packs/day: 0.50    Years: 4.00    Pack years: 2.00    Last attempt to quit: 09/18/1991    Years since quitting: 26.2  . Smokeless tobacco: Never Used  Substance and Sexual Activity  . Alcohol use: No  . Drug use: No  . Sexual activity: Yes    Birth control/protection: Surgical

## 2017-12-18 NOTE — Therapy (Signed)
[facility] [address] [place], [place], [address] Phone: [phone]   Fax:  [phone]  Physical Therapy Treatment  Patient Details  Name: Marilyn [name] MRN: [id number] Date of Birth: May 13, 1971 Referring Provider: Gordy Savershristopher Balckman MD.   Encounter Date: 12/18/2017  PT End of Session - 12/18/17 1440    Visit Number  8    Number of Visits  12    Date for PT Re-Evaluation  01/14/18    PT Start Time  1440    PT Stop Time  1523    PT Time Calculation (min)  43 min    Activity Tolerance  Patient tolerated treatment well    Behavior During Therapy  [facility] for tasks assessed/performed       Past Medical History:  Diagnosis Date  . Anemia    taking iron now. pt states having no current issues 09/02/2015  . Anginal pain (HCC)    pt states experiences chest wall pain pt states related to her asthma   . Anxiety    with MRI's  . Arthritis   . Asthma   . Dizziness   . GERD (gastroesophageal reflux disease)   . Headache(784.0)    HX OF MIGRAINES  . History of bronchitis   . Hypertension   . Hypothyroidism    takes levothyroxen  . OSA on CPAP   . Shortness of breath    with exertion  . Wears glasses     Past Surgical History:  Procedure Laterality Date  . CESAREAN SECTION     times 2  . CHOLECYSTECTOMY    . ENDOMETRIAL ABLATION    . INCISION AND DRAINAGE HIP Left 11/10/2015   Procedure: IRRIGATION AND DEBRIDEMENT LEFT HIP INCISION;  Surgeon: Kathryne Hitchhristopher Y Blackman, MD;  Location[facility];  Service: Orthopedics;  Laterality: Left;  . JOINT REPLACEMENT  2011   total left knee  . KNEE ARTHROPLASTY  04/23/2012   right   . KNEE ARTHROSCOPY    . ROTATOR CUFF REPAIR     left   . SHOULDER SURGERY     right to repair ligament tear  . TOTAL HIP ARTHROPLASTY Left 09/09/2015   Procedure: LEFT TOTAL HIP ARTHROPLASTY ANTERIOR APPROACH;  Surgeon: Kathryne Hitchhristopher Y Blackman, MD;  Location[facility];  Service: Orthopedics;  Laterality:  Left;  . TOTAL KNEE ARTHROPLASTY  04/23/2012   Procedure: TOTAL KNEE ARTHROPLASTY;  Surgeon: Nadara MustardMarcus V Duda, MD;  Location[facility];  Service: Orthopedics;  Laterality: Right;  Right Total Knee Arthroplasty  . TUBAL LIGATION  1996    There were no vitals filed for this visit.  Subjective Assessment - 12/18/17 1439    Subjective  Reports that she was referred to a back surgeon and is hurting today.    Pertinent History  H/o LBP; spinal arthritis.    Limitations  Walking    How long can you sit comfortably?  20 minutes.    How long can you stand comfortably?  15-20 minutes.    How long can you walk comfortably?  Short distances around house.    Diagnostic tests  MRI:  Please look under "Imaging" tab for details.    Patient Stated Goals  Get around without pain.    Currently in Pain?  Yes    Pain Score  7     Pain Location  Back    Pain Orientation  Lower    Pain Descriptors / Indicators  Discomfort    Pain Type  Chronic pain  Pain Onset  More than a month ago    Pain Frequency  Constant         [facility] PT Assessment - 12/18/17 0001      Assessment   Medical Diagnosis  Chronic right-sided low abck pain with right sided sciatica.    Next MD Visit  TBD      Precautions   Precautions  None      Restrictions   Weight Bearing Restrictions  No                   [facility] Adult PT Treatment/Exercise - 12/18/17 0001      Modalities   Modalities  Electrical Stimulation;Moist Heat;Ultrasound      Moist Heat Therapy   Number Minutes Moist Heat  15 Minutes    Moist Heat Location  Lumbar Spine      Electrical Stimulation   Electrical Stimulation Location  B low back    Electrical Stimulation Action  Pre-Mod    Electrical Stimulation Parameters  80-150 hz x15 min    Electrical Stimulation Goals  Pain      Ultrasound   Ultrasound Location  B lumbar paraspinals    Ultrasound Parameters  1.5 w/cm2, 100%, 1 mhz x10 min    Ultrasound Goals  Pain      Manual Therapy    Manual Therapy  Soft tissue mobilization    Soft tissue mobilization  STW to B lumbar paraspinals/ QL region to reduce muscle tightness and pain                  PT Long Term Goals - 11/21/17 1348      PT LONG TERM GOAL #1   Title  independent with a HEP.    Time  6    Period  Weeks    Status  On-going      PT LONG TERM GOAL #2   Title  Walk a short cummunity distance wiht pain not > 4/10.    Time  6    Period  Weeks    Status  On-going      PT LONG TERM GOAL #3   Title  Eliminate right LE symptoms.    Time  6    Period  Weeks    Status  On-going      PT LONG TERM GOAL #4   Title  Perform ADL's with pain not > 4/10.    Time  6    Period  Weeks    Status  On-going            Plan - 12/18/17 1536    Clinical Impression Statement  Patient tolerated today's treatment fairly well as she arrived with increased LBP. Patient hesistant to talk with back surgeon per reports. Increased muscle tightness of B lumbar paraspinals at approx L4 region L > R. Patient more sensitive to R lumbar paraspinals manual therapy. Normal modalities response noted following removal of the modalities. Limited goal progression per pain reports.    Rehab Potential  Excellent    Clinical Impairments Affecting Rehab Potential  Severity of pain.    PT Frequency  2x / week    PT Duration  6 weeks    PT Treatment/Interventions  ADLs/Self Care Home Management;Cryotherapy;Electrical Stimulation;Ultrasound;Moist Heat;Therapeutic activities;Therapeutic exercise;Patient/family education;Manual techniques;Dry needling    PT Next Visit Plan  cont with POC    Consulted and Agree with Plan of Care  Patient       Patient will  benefit from skilled therapeutic intervention in order to improve the following deficits and impairments:  Obesity, Decreased activity tolerance, Decreased range of motion, Difficulty walking, Decreased mobility, Postural dysfunction, Pain  Visit Diagnosis: Acute right-sided  low back pain with right-sided sciatica  Abnormal posture     Problem List Patient Active Problem List   Diagnosis Date Noted  . Pain of right hip joint 07/03/2017  . Acute right-sided low back pain with right-sided sciatica 03/27/2017  . Chronic right shoulder pain 02/13/2017  . History of rotator cuff tear 11/30/2016  . Impingement syndrome of right shoulder 11/30/2016  . Exacerbation of asthma 07/18/2016  . Hypokalemia 07/18/2016  . Hypertension 07/18/2016  . Depression with anxiety 07/18/2016  . Hypothyroidism 07/18/2016  . Hyperglycemia 07/18/2016  . Acute asthma exacerbation 07/18/2016  . Surgical wound dehiscence left hip; questionable superficial infection 11/10/2015  . Left hip postoperative wound infection 11/10/2015  . Osteoarthritis of left hip 09/09/2015  . 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 Obesity (BMI 35.0-39.9 without comorbidity) 04/28/2013    Marvell Fuller, PTA 12/18/2017, 3:38 PM  [facility] [address] [place], [place], [address] Phone: [phone]   Fax:  [phone]  Name: ALISON KUBICKI MRN: [id number] Date of Birth: 1971-03-07

## 2017-12-23 ENCOUNTER — Ambulatory Visit: Payer: Medicare Other | Admitting: Physical Therapy

## 2017-12-23 ENCOUNTER — Encounter: Payer: Self-pay | Admitting: Physical Therapy

## 2017-12-23 DIAGNOSIS — M5441 Lumbago with sciatica, right side: Secondary | ICD-10-CM

## 2017-12-23 DIAGNOSIS — R293 Abnormal posture: Secondary | ICD-10-CM

## 2017-12-23 NOTE — Therapy (Signed)
[facility] [place], [name], [address] Phone: [phone]   Fax:  [phone]  Physical Therapy Treatment  Patient Details  Name: GENAE STRINE MRN: [id number] Date of Birth: 02-24-1971 Referring Provider: Doyne Keel MD.   Encounter Date: 12/23/2017  PT End of Session - 12/23/17 1440    Visit Number  9    Number of Visits  12    Date for PT Re-Evaluation  01/14/18    PT Start Time  9191    PT Stop Time  1519    PT Time Calculation (min)  42 min    Activity Tolerance  Patient tolerated treatment well    Behavior During Therapy  [facility] for tasks assessed/performed       Past Medical History:  Diagnosis Date  . Anemia    taking iron now. pt states having no current issues 09/02/2015  . Anginal pain ([place])    pt states experiences chest wall pain pt states related to her asthma   . Anxiety    with MRI's  . Arthritis   . Asthma   . Dizziness   . GERD (gastroesophageal reflux disease)   . Headache(784.0)    HX OF MIGRAINES  . History of bronchitis   . Hypertension   . Hypothyroidism    takes levothyroxen  . OSA on CPAP   . Shortness of breath    with exertion  . Wears glasses     Past Surgical History:  Procedure Laterality Date  . CESAREAN SECTION     times 2  . CHOLECYSTECTOMY    . ENDOMETRIAL ABLATION    . INCISION AND DRAINAGE HIP Left 11/10/2015   Procedure: IRRIGATION AND DEBRIDEMENT LEFT HIP INCISION;  Surgeon: Mcarthur Rossetti, MD;  Location: [facility];  Service: Orthopedics;  Laterality: Left;  . JOINT REPLACEMENT  2011   total left knee  . KNEE ARTHROPLASTY  04/23/2012   right   . KNEE ARTHROSCOPY    . ROTATOR CUFF REPAIR     left   . SHOULDER SURGERY     right to repair ligament tear  . TOTAL HIP ARTHROPLASTY Left 09/09/2015   Procedure: LEFT TOTAL HIP ARTHROPLASTY ANTERIOR APPROACH;  Surgeon: Mcarthur Rossetti, MD;  Location: [facility];  Service: Orthopedics;  Laterality:  Left;  . TOTAL KNEE ARTHROPLASTY  04/23/2012   Procedure: TOTAL KNEE ARTHROPLASTY;  Surgeon: Newt Minion, MD;  Location: [facility];  Service: Orthopedics;  Laterality: Right;  Right Total Knee Arthroplasty  . TUBAL LIGATION  1996    There were no vitals filed for this visit.  Subjective Assessment - 12/23/17 1437    Subjective  Patient arrived with reported ongoing discomfort    Pertinent History  H/o LBP; spinal arthritis.    Limitations  Walking    How long can you sit comfortably?  20 minutes.    How long can you stand comfortably?  15-20 minutes.    How long can you walk comfortably?  Short distances around house.    Diagnostic tests  MRI:  Please look under "Imaging" tab for details.    Patient Stated Goals  Get around without pain.    Currently in Pain?  Yes    Pain Score  7     Pain Location  Back    Pain Orientation  Right;Lower;Left more right    Pain Descriptors / Indicators  Discomfort    Pain Type  Chronic pain    Pain Onset  More than a month ago    Pain Frequency  Constant    Aggravating Factors   prolong standing or activity    Pain Relieving Factors  changing position                       [facility] Adult PT Treatment/Exercise - 12/23/17 0001      Moist Heat Therapy   Number Minutes Moist Heat  15 Minutes    Moist Heat Location  Lumbar Spine      Electrical Stimulation   Electrical Stimulation Location  B low back    Electrical Stimulation Action  premod    Electrical Stimulation Parameters  80-_0  x24mn    Electrical Stimulation Goals  Pain      Ultrasound   Ultrasound Location  B lumbar paraspinals    Ultrasound Parameters  1.5w/cm2/100%/128m x1026m   Ultrasound Goals  Pain      Manual Therapy   Manual Therapy  Soft tissue mobilization    Soft tissue mobilization  STW to B lumbar paraspinals/ QL region to reduce muscle tightness and pain                  PT Long Term Goals - 11/21/17 1348      PT LONG TERM GOAL #1   Title   independent with a HEP.    Time  6    Period  Weeks    Status  On-going      PT LONG TERM GOAL #2   Title  Walk a short cummunity distance wiht pain not > 4/10.    Time  6    Period  Weeks    Status  On-going      PT LONG TERM GOAL #3   Title  Eliminate right LE symptoms.    Time  6    Period  Weeks    Status  On-going      PT LONG TERM GOAL #4   Title  Perform ADL's with pain not > 4/10.    Time  6    Period  Weeks    Status  On-going            Plan - 12/23/17 1442    Clinical Impression Statement  Patient tolerated treatment well today. Patient reported ongoing discomfort in low back esp right side. Patient feels relief from therapy yet temporary. No further goals met at this time. MD appt with back doctor on 01/06/18.    Rehab Potential  Excellent    Clinical Impairments Affecting Rehab Potential  Severity of pain.    PT Frequency  2x / week    PT Duration  6 weeks    PT Treatment/Interventions  ADLs/Self Care Home Management;Cryotherapy;Electrical Stimulation;Ultrasound;Moist Heat;Therapeutic activities;Therapeutic exercise;Patient/family education;Manual techniques;Dry needling    PT Next Visit Plan  cont with POC    Consulted and Agree with Plan of Care  Patient       Patient will benefit from skilled therapeutic intervention in order to improve the following deficits and impairments:  Obesity, Decreased activity tolerance, Decreased range of motion, Difficulty walking, Decreased mobility, Postural dysfunction, Pain  Visit Diagnosis: Acute right-sided low back pain with right-sided sciatica  Abnormal posture     Problem List Patient Active Problem List   Diagnosis Date Noted  . Pain of right hip joint 07/03/2017  . Acute right-sided low back pain with right-sided sciatica 03/27/2017  . Chronic right shoulder pain 02/13/2017  . History  of rotator cuff tear 11/30/2016  . Impingement syndrome of right shoulder 11/30/2016  . Exacerbation of asthma  07/18/2016  . Hypokalemia 07/18/2016  . Hypertension 07/18/2016  . Depression with anxiety 07/18/2016  . Hypothyroidism 07/18/2016  . Hyperglycemia 07/18/2016  . Acute asthma exacerbation 07/18/2016  . Surgical wound dehiscence left hip; questionable superficial infection 11/10/2015  . Left hip postoperative wound infection 11/10/2015  . Osteoarthritis of left hip 09/09/2015  . 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 Obesity (BMI 35.0-39.9 without comorbidity) 04/28/2013    Kennedi Lizardo P, PTA 12/23/2017, 3:30 PM  [facility] [address] [place], [name], [address] Phone: [phone]   Fax:  [phone]  Name: TARANEH METHENEY MRN: [id number] Date of Birth: Feb 06, 1971

## 2017-12-25 ENCOUNTER — Ambulatory Visit: Payer: Medicare Other | Admitting: Physical Therapy

## 2017-12-25 DIAGNOSIS — M5441 Lumbago with sciatica, right side: Secondary | ICD-10-CM | POA: Diagnosis not present

## 2017-12-25 NOTE — Therapy (Signed)
[facility] [address] [place], [place], [address] Phone: [phone]   Fax:  [phone]  Physical Therapy Treatment  Patient Details  Name: Marilyn [name] MRN: [id number] Date of Birth: 04-28-1971 Referring Provider: Gordy Savershristopher Balckman MD.   Encounter Date: 12/25/2017  PT End of Session - 12/25/17 1458    Visit Number  10    Number of Visits  12    Date for PT Re-Evaluation  01/14/18    PT Start Time  1455 very late arrival; E-stim only    PT Stop Time  1512    PT Time Calculation (min)  17 min    Activity Tolerance  Patient tolerated treatment well    Behavior During Therapy  [facility] for [facility] assessed/performed       Past Medical History:  Diagnosis Date  . Anemia    taking iron now. pt states having no current issues 09/02/2015  . Anginal pain (HCC)    pt states experiences chest wall pain pt states related to her asthma   . Anxiety    with MRI's  . Arthritis   . Asthma   . Dizziness   . GERD (gastroesophageal reflux disease)   . Headache(784.0)    HX OF MIGRAINES  . History of bronchitis   . Hypertension   . Hypothyroidism    takes levothyroxen  . OSA on CPAP   . Shortness of breath    with exertion  . Wears glasses     Past Surgical History:  Procedure Laterality Date  . CESAREAN SECTION     times 2  . CHOLECYSTECTOMY    . ENDOMETRIAL ABLATION    . INCISION AND DRAINAGE HIP Left 11/10/2015   Procedure: IRRIGATION AND DEBRIDEMENT LEFT HIP INCISION;  Surgeon: Kathryne Hitchhristopher Y Blackman, MD;  [facility];  Service: Orthopedics;  Laterality: Left;  . JOINT REPLACEMENT  2011   total left knee  . KNEE ARTHROPLASTY  04/23/2012   right   . KNEE ARTHROSCOPY    . ROTATOR CUFF REPAIR     left   . SHOULDER SURGERY     right to repair ligament tear  . TOTAL HIP ARTHROPLASTY Left 09/09/2015   Procedure: LEFT TOTAL HIP ARTHROPLASTY ANTERIOR APPROACH;  Surgeon: Kathryne Hitchhristopher Y Blackman, MD;  [facility];   Service: Orthopedics;  Laterality: Left;  . TOTAL KNEE ARTHROPLASTY  04/23/2012   Procedure: TOTAL KNEE ARTHROPLASTY;  Surgeon: Nadara MustardMarcus V Duda, MD;  Location[facility];  Service: Orthopedics;  Laterality: Right;  Right Total Knee Arthroplasty  . TUBAL LIGATION  1996    There were no vitals filed for this visit.  Subjective Assessment - 12/25/17 1458    Subjective  Patient stated pain is bad secondary from just finishing work. 6/10    Pertinent History  H/o LBP; spinal arthritis.    Limitations  Walking    How long can you sit comfortably?  20 minutes.    How long can you stand comfortably?  15-20 minutes.    How long can you walk comfortably?  Short distances around house.    Diagnostic tests  MRI:  Please look under "Imaging" tab for details.    Patient Stated Goals  Get around without pain.    Currently in Pain?  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                   [facility] Adult PT Treatment/Exercise - 12/25/17 0001      Moist Heat Therapy   Number Minutes Moist Heat  15 Minutes    Moist Heat Location  Lumbar Spine      Electrical Stimulation   Electrical Stimulation Location  R low back    Electrical Stimulation Action  pre-mod    Electrical Stimulation Parameters  80-150 hz x 15 min    Electrical Stimulation Goals  Pain                  PT Long Term Goals - 11/21/17 1348      PT LONG TERM GOAL #1   Title  independent with a HEP.    Time  6    Period  Weeks    Status  On-going      PT LONG TERM GOAL #2   Title  Walk a short cummunity distance wiht pain not > 4/10.    Time  6    Period  Weeks    Status  On-going      PT LONG TERM GOAL #3   Title   Eliminate right LE symptoms.    Time  6    Period  Weeks    Status  On-going      PT LONG TERM GOAL #4   Title  Perform ADL's with pain not > 4/10.    Time  6    Period  Weeks    Status  On-going            Plan - 12/25/17 1500    Clinical Impression Statement  Secondary to late arrival, patient only recieved e-stim and moist heat. Patient reported a decrease in pain at end of session. Normal response to modalities upon removal.    Clinical Presentation  Evolving    Clinical Decision Making  Low    Rehab Potential  Excellent    Clinical Impairments Affecting Rehab Potential  Severity of pain.    PT Treatment/Interventions  ADLs/Self Care Home Management;Cryotherapy;Electrical Stimulation;Ultrasound;Moist Heat;Therapeutic activities;Therapeutic exercise;Patient/family education;Manual techniques;Dry needling    PT Next Visit Plan  cont with POC    Consulted and Agree with Plan of Care  Patient       Patient will benefit from skilled therapeutic intervention in order to improve the following deficits and impairments:  Obesity, Decreased activity tolerance, Decreased range of motion, Difficulty walking, Decreased mobility, Postural dysfunction, Pain  Visit Diagnosis: Acute right-sided low back pain with right-sided sciatica     Problem List Patient Active Problem List   Diagnosis Date Noted  . Pain of right hip joint 07/03/2017  . Acute right-sided low back pain with right-sided sciatica 03/27/2017  . Chronic right shoulder pain 02/13/2017  . History of rotator cuff tear 11/30/2016  . Impingement syndrome of right shoulder 11/30/2016  . Exacerbation of asthma 07/18/2016  . Hypokalemia 07/18/2016  . Hypertension 07/18/2016  . Depression with anxiety 07/18/2016  . Hypothyroidism 07/18/2016  . Hyperglycemia 07/18/2016  . Acute asthma exacerbation 07/18/2016  . Surgical wound dehiscence left hip; questionable superficial infection 11/10/2015  . Left hip postoperative  wound infection 11/10/2015  . Osteoarthritis of left hip 09/09/2015  . 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 Obesity (BMI 35.0-39.9 without comorbidity) 04/28/2013   Guss Bunde, PT, DPT 12/25/2017, 3:13 PM  [facility] [address] [place], [place], [address] Phone: [phone]   Fax:  [phone]  Name: Marilyn [name] MRN: [id number] Date of Birth: Apr 21, 1971

## 2017-12-30 ENCOUNTER — Ambulatory Visit: Payer: Medicare Other | Admitting: Physical Therapy

## 2017-12-30 ENCOUNTER — Encounter: Payer: Self-pay | Admitting: Physical Therapy

## 2017-12-30 DIAGNOSIS — M5441 Lumbago with sciatica, right side: Secondary | ICD-10-CM | POA: Diagnosis not present

## 2017-12-30 DIAGNOSIS — R293 Abnormal posture: Secondary | ICD-10-CM

## 2017-12-30 NOTE — Therapy (Signed)
[facility] [address] [place], [place], [address] Phone: [phone]   Fax:  [phone]  Physical Therapy Treatment  Patient Details  Name: Marilyn [name] MRN: [id number] Date of Birth: 1970/10/11 Referring Provider: Gordy Savers MD.   Encounter Date: 12/30/2017  PT End of Session - 12/30/17 1441    Visit Number  11    Number of Visits  12    Date for PT Re-Evaluation  01/14/18    PT Start Time  1441    PT Stop Time  1522    PT Time Calculation (min)  41 min    Activity Tolerance  Patient tolerated treatment well    Behavior During Therapy  [facility] for tasks assessed/performed       Past Medical History:  Diagnosis Date  . Anemia    taking iron now. pt states having no current issues 09/02/2015  . Anginal pain (HCC)    pt states experiences chest wall pain pt states related to her asthma   . Anxiety    with MRI's  . Arthritis   . Asthma   . Dizziness   . GERD (gastroesophageal reflux disease)   . Headache(784.0)    HX OF MIGRAINES  . History of bronchitis   . Hypertension   . Hypothyroidism    takes levothyroxen  . OSA on CPAP   . Shortness of breath    with exertion  . Wears glasses     Past Surgical History:  Procedure Laterality Date  . CESAREAN SECTION     times 2  . CHOLECYSTECTOMY    . ENDOMETRIAL ABLATION    . INCISION AND DRAINAGE HIP Left 11/10/2015   Procedure: IRRIGATION AND DEBRIDEMENT LEFT HIP INCISION;  Surgeon: Kathryne Hitch, MD;  [facility];  Service: Orthopedics;  Laterality: Left;  . JOINT REPLACEMENT  2011   total left knee  . KNEE ARTHROPLASTY  04/23/2012   right   . KNEE ARTHROSCOPY    . ROTATOR CUFF REPAIR     left   . SHOULDER SURGERY     right to repair ligament tear  . TOTAL HIP ARTHROPLASTY Left 09/09/2015   Procedure: LEFT TOTAL HIP ARTHROPLASTY ANTERIOR APPROACH;  Surgeon: Kathryne Hitch, MD;  [facility];  Service: Orthopedics;  Laterality:  Left;  . TOTAL KNEE ARTHROPLASTY  04/23/2012   Procedure: TOTAL KNEE ARTHROPLASTY;  Surgeon: [name], MD;  [facility];  Service: Orthopedics;  Laterality: Right;  Right Total Knee Arthroplasty  . TUBAL LIGATION  1996    There were no vitals filed for this visit.  Subjective Assessment - 12/30/17 1441    Subjective  Reports she has been running errands today.    Pertinent History  H/o LBP; spinal arthritis.    Limitations  Walking    How long can you sit comfortably?  20 minutes.    How long can you stand comfortably?  15-20 minutes.    How long can you walk comfortably?  Short distances around house.    Diagnostic tests  MRI:  Please look under "Imaging" tab for details.    Patient Stated Goals  Get around without pain.    Currently in Pain?  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    Next MD Visit  01/06/2018      Precautions   Precautions  None      Restrictions   Weight Bearing Restrictions  No                   [facility] Adult PT Treatment/Exercise - 12/30/17 0001      Modalities   Modalities  Electrical Stimulation;Moist Heat;Ultrasound      Moist Heat Therapy   Number Minutes Moist Heat  15 Minutes    Moist Heat Location  Lumbar Spine      Electrical Stimulation   Electrical Stimulation Location  B low back    Electrical Stimulation Action  Pre-Mod    Electrical Stimulation Parameters  80-150 hz x15 min    Electrical Stimulation Goals  Pain      Ultrasound   Ultrasound Location  B lumbar paraspinals    Ultrasound Parameters  1.5 w/cm2, 100%, 1 mhz x10 mi    Ultrasound Goals  Pain      Manual Therapy   Manual Therapy  Soft tissue mobilization    Soft  tissue mobilization  STW to B lumbar paraspinals/ QL region to reduce muscle tightness and pain                  PT Long Term Goals - 11/21/17 1348      PT LONG TERM GOAL #1   Title  independent with a HEP.    Time  6    Period  Weeks    Status  On-going      PT LONG TERM GOAL #2   Title  Walk a short cummunity distance wiht pain not > 4/10.    Time  6    Period  Weeks    Status  On-going      PT LONG TERM GOAL #3   Title  Eliminate right LE symptoms.    Time  6    Period  Weeks    Status  On-going      PT LONG TERM GOAL #4   Title  Perform ADL's with pain not > 4/10.    Time  6    Period  Weeks    Status  On-going            Plan - 12/30/17 1525    Clinical Impression Statement  Patient tolerated today's treatment well although very tender to manual therapy. Normal modalities response noted following removal of the modalities. Patient especially tender around R lumbar paraspinals as well as surrounding the R SI joint. Patient still limited by pain especially by the end of the day per patient reports.    Rehab Potential  Excellent    Clinical Impairments Affecting Rehab Potential  Severity of pain.    PT Frequency  2x / week    PT Duration  6 weeks    PT Treatment/Interventions  ADLs/Self Care Home Management;Cryotherapy;Electrical Stimulation;Ultrasound;Moist Heat;Therapeutic activities;Therapeutic exercise;Patient/family education;Manual techniques;Dry needling    PT Next Visit Plan  cont with POC    Consulted and Agree with Plan of Care  Patient       Patient will benefit from skilled therapeutic intervention in order to improve the following deficits and impairments:  Obesity, Decreased activity tolerance,  Decreased range of motion, Difficulty walking, Decreased mobility, Postural dysfunction, Pain  Visit Diagnosis: Acute right-sided low back pain with right-sided sciatica  Abnormal posture     Problem List Patient Active Problem List    Diagnosis Date Noted  . Pain of right hip joint 07/03/2017  . Acute right-sided low back pain with right-sided sciatica 03/27/2017  . Chronic right shoulder pain 02/13/2017  . History of rotator cuff tear 11/30/2016  . Impingement syndrome of right shoulder 11/30/2016  . Exacerbation of asthma 07/18/2016  . Hypokalemia 07/18/2016  . Hypertension 07/18/2016  . Depression with anxiety 07/18/2016  . Hypothyroidism 07/18/2016  . Hyperglycemia 07/18/2016  . Acute asthma exacerbation 07/18/2016  . Surgical wound dehiscence left hip; questionable superficial infection 11/10/2015  . Left hip postoperative wound infection 11/10/2015  . Osteoarthritis of left hip 09/09/2015  . 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 Obesity (BMI 35.0-39.9 without comorbidity) 04/28/2013    Marvell FullerKelsey P Sidney Kann, PTA 12/30/2017, 3:31 PM  [facility] [address] [place], [place], [address] Phone: [phone]   Fax:  [phone]  Name: Donley Redderhelma M Bauers MRN: [id number] Date of Birth: August 18, 1971

## 2018-01-02 ENCOUNTER — Encounter: Payer: Self-pay | Admitting: Physical Therapy

## 2018-01-02 ENCOUNTER — Ambulatory Visit: Payer: Medicare Other | Admitting: Physical Therapy

## 2018-01-02 DIAGNOSIS — M5441 Lumbago with sciatica, right side: Secondary | ICD-10-CM | POA: Diagnosis not present

## 2018-01-02 DIAGNOSIS — R293 Abnormal posture: Secondary | ICD-10-CM

## 2018-01-02 NOTE — Therapy (Addendum)
[facility] [place], [name], [address] Phone: [phone]   Fax:  [phone]  Physical Therapy Treatment  Patient Details  Name: Marilyn [name] MRN: [id number] Date of Birth: May 28, 1971 Referring Provider: Doyne Keel MD.   Encounter Date: 01/02/2018  PT End of Session - 01/02/18 1437    Visit Number  12    Number of Visits  12    Date for PT Re-Evaluation  01/14/18    PT Start Time  1440    PT Stop Time  1522    PT Time Calculation (min)  42 min    Activity Tolerance  Patient tolerated treatment well    Behavior During Therapy  [facility] for tasks assessed/performed       Past Medical History:  Diagnosis Date  . Anemia    taking iron now. pt states having no current issues 09/02/2015  . Anginal pain ([place])    pt states experiences chest wall pain pt states related to her asthma   . Anxiety    with MRI's  . Arthritis   . Asthma   . Dizziness   . GERD (gastroesophageal reflux disease)   . Headache(784.0)    HX OF MIGRAINES  . History of bronchitis   . Hypertension   . Hypothyroidism    takes levothyroxen  . OSA on CPAP   . Shortness of breath    with exertion  . Wears glasses     Past Surgical History:  Procedure Laterality Date  . CESAREAN SECTION     times 2  . CHOLECYSTECTOMY    . ENDOMETRIAL ABLATION    . INCISION AND DRAINAGE HIP Left 11/10/2015   Procedure: IRRIGATION AND DEBRIDEMENT LEFT HIP INCISION;  Surgeon: Mcarthur Rossetti, MD;  Location: [place];  Service: Orthopedics;  Laterality: Left;  . JOINT REPLACEMENT  2011   total left knee  . KNEE ARTHROPLASTY  04/23/2012   right   . KNEE ARTHROSCOPY    . ROTATOR CUFF REPAIR     left   . SHOULDER SURGERY     right to repair ligament tear  . TOTAL HIP ARTHROPLASTY Left 09/09/2015   Procedure: LEFT TOTAL HIP ARTHROPLASTY ANTERIOR APPROACH;  Surgeon: Mcarthur Rossetti, MD;  [facility];  Service: Orthopedics;  Laterality:  Left;  . TOTAL KNEE ARTHROPLASTY  04/23/2012   Procedure: TOTAL KNEE ARTHROPLASTY;  Surgeon: Newt Minion, MD;  Location: [place];  Service: Orthopedics;  Laterality: Right;  Right Total Knee Arthroplasty  . TUBAL LIGATION  1996    There were no vitals filed for this visit.  Subjective Assessment - 01/02/18 1436    Subjective  Reports her back is the same.    Pertinent History  H/o LBP; spinal arthritis.    Limitations  Walking    How long can you sit comfortably?  20 minutes.    How long can you stand comfortably?  15-20 minutes or less    How long can you walk comfortably?  Short distances around house.    Diagnostic tests  MRI:  Please look under "Imaging" tab for details.    Patient Stated Goals  Get around without pain.    Currently in Pain?  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    Next MD Visit  01/06/2018      Precautions   Precautions  None      Restrictions   Weight Bearing Restrictions  No                   [facility] Adult PT Treatment/Exercise - 01/02/18 0001      Modalities   Modalities  Electrical Stimulation;Moist Heat;Ultrasound      Moist Heat Therapy   Number Minutes Moist Heat  15 Minutes    Moist Heat Location  Lumbar Spine      Electrical Stimulation   Electrical Stimulation Location  B low back    Electrical Stimulation Action  Pre-Mod    Electrical Stimulation Parameters  80-150 hz x15 min    Electrical Stimulation Goals  Pain      Ultrasound   Ultrasound Location  B lumbar paraspinals    Ultrasound Parameters  1.5 w/cm2,100%, 1 mhz x10 min    Ultrasound Goals  Pain      Manual Therapy   Manual Therapy  Soft tissue mobilization    Soft tissue  mobilization  STW to B lumbar paraspinals/ QL region to reduce muscle tightness and pain                  PT Long Term Goals - 01/02/18 1523      PT LONG TERM GOAL #1   Title  independent with a HEP.    Time  6    Period  Weeks    Status  Not Met      PT LONG TERM GOAL #2   Title  Walk a short cummunity distance wiht pain not > 4/10.    Time  6    Period  Weeks    Status  Achieved      PT LONG TERM GOAL #3   Title  Eliminate right LE symptoms.    Time  6    Period  Weeks    Status  Not Met      PT LONG TERM GOAL #4   Title  Perform ADL's with pain not > 4/10.    Time  6    Period  Weeks    Status  Partially Met As long as intermittant sitting permitted per patient report 01/02/2018            Plan - 01/02/18 1529    Clinical Impression Statement  Patient has not progressed via PT for LBP. Patient still limited with tolerable sitting, standing and walking. Increased muscle tightness palpated along B L5 region in lumbar paraspinals, QL region. Patient did not report any sensitivity to manual therapy today. Normal modalities response noted following removal of the modalities. Goal achievement limited due to pain as patient able to tolerate short distances and activities as long as sitting permitted intermittantly.    Rehab Potential  Excellent    Clinical Impairments Affecting Rehab Potential  Severity of pain.    PT Frequency  2x / week    PT Duration  6 weeks    PT Treatment/Interventions  ADLs/Self Care Home Management;Cryotherapy;Electrical Stimulation;Ultrasound;Moist Heat;Therapeutic activities;Therapeutic exercise;Patient/family education;Manual techniques;Dry needling    PT Next Visit Plan  D/C summary due to returning to meet with orthopedic surgeon.  PT Home Exercise Plan  --    Consulted and Agree with Plan of Care  Patient       Patient will benefit from skilled therapeutic intervention in order to improve the following deficits and impairments:   Obesity, Decreased activity tolerance, Decreased range of motion, Difficulty walking, Decreased mobility, Postural dysfunction, Pain  Visit Diagnosis: Acute right-sided low back pain with right-sided sciatica  Abnormal posture     Problem List Patient Active Problem List   Diagnosis Date Noted  . Pain of right hip joint 07/03/2017  . Acute right-sided low back pain with right-sided sciatica 03/27/2017  . Chronic right shoulder pain 02/13/2017  . History of rotator cuff tear 11/30/2016  . Impingement syndrome of right shoulder 11/30/2016  . Exacerbation of asthma 07/18/2016  . Hypokalemia 07/18/2016  . Hypertension 07/18/2016  . Depression with anxiety 07/18/2016  . Hypothyroidism 07/18/2016  . Hyperglycemia 07/18/2016  . Acute asthma exacerbation 07/18/2016  . Surgical wound dehiscence left hip; questionable superficial infection 11/10/2015  . Left hip postoperative wound infection 11/10/2015  . Osteoarthritis of left hip 09/09/2015  . 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 Obesity (BMI 35.0-39.9 without comorbidity) 04/28/2013    Standley Brooking, PTA 01/02/2018, 3:36 PM  [facility] [address] [place], [name], [address] Phone: [phone]   Fax:  [phone]  Name: JALYNN WADDELL MRN: [id number] Date of Birth: 1971/01/31  PHYSICAL THERAPY DISCHARGE SUMMARY  Visits from Start of Care: 12.  Current functional level related to goals / functional outcomes: See above.   Remaining deficits: Continued c/o low back pain.   Education / Equipment: HEP. Plan: Patient agrees to discharge.  Patient goals were partially met. Patient is being discharged due to lack of progress.  ?????      Mali Applegate MPT

## 2018-01-06 ENCOUNTER — Ambulatory Visit (INDEPENDENT_AMBULATORY_CARE_PROVIDER_SITE_OTHER): Payer: Medicare Other | Admitting: Specialist

## 2018-01-29 ENCOUNTER — Encounter (INDEPENDENT_AMBULATORY_CARE_PROVIDER_SITE_OTHER): Payer: Self-pay | Admitting: Specialist

## 2018-01-29 ENCOUNTER — Ambulatory Visit (INDEPENDENT_AMBULATORY_CARE_PROVIDER_SITE_OTHER): Payer: Medicare Other

## 2018-01-29 ENCOUNTER — Ambulatory Visit (INDEPENDENT_AMBULATORY_CARE_PROVIDER_SITE_OTHER): Payer: Medicare Other | Admitting: Specialist

## 2018-01-29 VITALS — BP 153/83 | HR 66 | Ht 63.0 in | Wt 330.0 lb

## 2018-01-29 DIAGNOSIS — G8929 Other chronic pain: Secondary | ICD-10-CM | POA: Diagnosis not present

## 2018-01-29 DIAGNOSIS — M5441 Lumbago with sciatica, right side: Secondary | ICD-10-CM

## 2018-01-29 DIAGNOSIS — M4726 Other spondylosis with radiculopathy, lumbar region: Secondary | ICD-10-CM

## 2018-01-29 DIAGNOSIS — M4316 Spondylolisthesis, lumbar region: Secondary | ICD-10-CM

## 2018-01-29 DIAGNOSIS — M5136 Other intervertebral disc degeneration, lumbar region: Secondary | ICD-10-CM

## 2018-01-29 MED ORDER — DICLOFENAC SODIUM 75 MG PO TBEC: 75.0000 mg | DELAYED_RELEASE_TABLET | Freq: Two times a day (BID) | ORAL | 2 refills | Status: DC

## 2018-01-29 MED ORDER — GABAPENTIN 300 MG PO CAPS: 300.0000 mg | ORAL_CAPSULE | Freq: Two times a day (BID) | ORAL | 3 refills | Status: DC

## 2018-01-29 MED ORDER — FAMOTIDINE 20 MG PO TABS
20.0000 mg | ORAL_TABLET | Freq: Two times a day (BID) | ORAL | 3 refills | Status: DC
Start: 2018-01-29 — End: 2018-02-24

## 2018-01-29 NOTE — Progress Notes (Signed)
Office Visit Note   Patient: Marilyn [name]           Date of Birth: May 04, 1971           MRN: [id number] Visit Date: 01/29/2018              Requested by: Joette Catching, MD [address] [place], [place] [address] PCP: Joette Catching, MD   Assessment & Plan: Visit Diagnoses:  1. Chronic midline low back pain with right-sided sciatica   2. Spondylolisthesis, lumbar region   3. Other spondylosis with radiculopathy, lumbar region   4. Degenerative disc disease, lumbar     Plan: Avoid bending, stooping and avoid lifting weights greater than 10 lbs. Avoid prolong standing and walking. Avoid frequent bending and stooping  No lifting greater than 10 lbs. May use ice or moist heat for pain. Weight loss is of benefit. Handicap license is approved. Lumbar flexion exercises and exercises in sitting like a stationary bike are recommended, pool walking is also  Tolerated better than prolong standing and walking.  Follow-Up Instructions: No follow-ups on file.   Orders:  Orders Placed This Encounter  Procedures  . XR Lumbar Spine Complete W/Bend   Meds ordered this encounter  Medications  . diclofenac (VOLTAREN) 75 MG EC tablet    Sig: Take 1 tablet (75 mg total) by mouth 2 (two) times daily.    Dispense:  60 tablet    Refill:  2  . famotidine (PEPCID) 20 MG tablet    Sig: Take 1 tablet (20 mg total) by mouth 2 (two) times daily.    Dispense:  60 tablet    Refill:  3  . gabapentin (NEURONTIN) 300 MG capsule    Sig: Take 1 capsule (300 mg total) by mouth 2 (two) times daily.    Dispense:  60 capsule    Refill:  3      Procedures: No procedures performed   Clinical Data: No additional findings.   Subjective: Chief Complaint  Patient presents with  . Lower Back - Follow-up, Pain  . 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  She had bilateral TKR Left 2012, and right 2013 and then left THR in 2016. She also has had arthroscopic  Surgery of her both sides twice by Dr. Lajoyce Corners. She has been seen by Dr. Magnus Ivan and Dr. Alvester Morin. Has had Facet blocks and  ESI x2. Last ESI was 3/14 right L4-5. The back pain is mainly confined to the back from the neck all the way down. She notices some stiffness, it is painful on a scale of 1-10 it is a 5 or 6 and has been as high as a 6-8. It goes out and is mostly a  6-8, she will not get out of the house, go anywheree or do anything. Stays in a bed or recliner or chair. Has difficulty getting up  From a chair or from bed.  She is able to walk but does use the motorized cart even before her hips and knee surgery. She has a can, no walker. There is night right upper thigh pain laterally and anterior and the side of the left thigh with tingling but not Sure about numbness. No bowel difficulty, constipation but does have bladder with stress incontinence even with the medication she is on oxybutynin. She could not  walk a mile, due to legs being tired. She is able to sit and shuffles some or squirms. The legs feel weak or tired, driving she has to move her legs before starting out to get going. Also in the am she  Gets dizzy headed and she will sit on the side of the bed and mobilize the legs. On amylodipine, med that was recalled.  Bending and stooping with pain, could lift 20 lbs. She goes to pain management, she is on belbuca. Takes percocet 5-325  qhs and as needed maybe 4-5 tablets per week.Pain is midline L-S and it is sharp some days others it is dull and it changes with weather. Pain with cold days, can tell weather changes.          Review of Systems  Constitutional: Positive for activity change and unexpected weight change. Negative for appetite change, chills, diaphoresis, fatigue and fever.  HENT: Positive for congestion and voice change. Negative for dental problem, drooling, ear  discharge, ear pain, facial swelling, hearing loss, mouth sores, nosebleeds, postnasal drip, rhinorrhea, sinus pressure, sinus pain, sneezing, sore throat, tinnitus and trouble swallowing.   Eyes: Negative for photophobia, pain, discharge, redness, itching and visual disturbance.  Respiratory: Negative for apnea, cough, choking, chest tightness and wheezing.   Cardiovascular: Positive for leg swelling. Negative for chest pain and palpitations.  Gastrointestinal: Negative for abdominal distention, abdominal pain, anal bleeding, blood in stool, constipation, diarrhea, nausea, rectal pain and vomiting.  Endocrine: Positive for cold intolerance.  Genitourinary: Positive for urgency. Negative for difficulty urinating, dysuria, enuresis, flank pain, frequency and hematuria.  Musculoskeletal: Positive for back pain, gait problem, neck pain and neck stiffness. Negative for arthralgias, joint swelling and myalgias.  Skin: Negative for color change, pallor, rash and wound.  Allergic/Immunologic: Positive for environmental allergies. Negative for food allergies and immunocompromised state.  Neurological: Positive for dizziness, weakness and light-headedness. Negative for tremors, seizures, facial asymmetry, speech difficulty, numbness and headaches.  Hematological: Negative for adenopathy. Does not bruise/bleed easily.  Psychiatric/Behavioral: Negative for agitation, behavioral problems, confusion, decreased concentration, dysphoric mood, hallucinations, self-injury, sleep disturbance and suicidal ideas. The patient is not nervous/anxious and is not hyperactive.      Objective: Vital Signs: BP (!) 153/83 (BP Location: Right Arm, Patient Position: Sitting, Cuff Size: Large)   Pulse 66   Ht  (1.6 m)   Wt (!) 330 lb (149.7 kg)   BMI 58.46 kg/m   Physical Exam  Constitutional: She is oriented to person, place, and time. She appears well-developed and well-nourished.  HENT:  Head: Normocephalic and  atraumatic.  Eyes: Pupils are equal, round, and reactive to light. EOM are normal.  Neck: Normal range of motion. Neck supple.  Pulmonary/Chest: Effort normal and breath sounds normal.  Abdominal: Soft. Bowel sounds are normal.  Musculoskeletal: Normal range of motion.  Neurological: She is alert and oriented to person, place, and time.  Skin: Skin is warm and dry.  Psychiatric: She has a normal mood and affect. Her behavior is normal. Judgment and thought content normal.    Ortho Exam  Specialty Comments:  No specialty comments available.  Imaging: No results found.   PMFS History: Patient Active Problem List   Diagnosis Date Noted  . Pain of right hip joint 07/03/2017  . Acute right-sided low back pain with right-sided sciatica 03/27/2017  . Chronic right shoulder pain 02/13/2017  . History of rotator cuff tear 11/30/2016  . Impingement syndrome of right shoulder 11/30/2016  . Exacerbation of  asthma 07/18/2016  . Hypokalemia 07/18/2016  . Hypertension 07/18/2016  . Depression with anxiety 07/18/2016  . Hypothyroidism 07/18/2016  . Hyperglycemia 07/18/2016  . Acute asthma exacerbation 07/18/2016  . Surgical wound dehiscence left hip; questionable superficial infection 11/10/2015  . Left hip postoperative wound infection 11/10/2015  . Osteoarthritis of left hip 09/09/2015  . 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 Obesity (BMI 35.0-39.9 without comorbidity) 04/28/2013   Past Medical History:  Diagnosis Date  . Anemia    taking iron now. pt states having no current issues 09/02/2015  . Anginal pain (HCC)    pt states experiences chest wall pain pt states related to her asthma   . Anxiety    with MRI's  . Arthritis   . Asthma   . Dizziness   . GERD (gastroesophageal reflux disease)   . Headache(784.0)    HX OF MIGRAINES  . History of bronchitis   . Hypertension   . Hypothyroidism    takes levothyroxen  . OSA on CPAP   . Shortness of breath     with exertion  . Wears glasses     Family History  Problem Relation Age of Onset  . Cancer Mother        colon  . Epilepsy Mother   . Cancer Father        prostate  . Diabetes Father   . Hypertension Father   . Hypertension Maternal Aunt   . Diabetes Maternal Aunt   . Hypertension Paternal Aunt     Past Surgical History:  Procedure Laterality Date  . CESAREAN SECTION     times 2  . CHOLECYSTECTOMY    . ENDOMETRIAL ABLATION    . INCISION AND DRAINAGE HIP Left 11/10/2015   Procedure: IRRIGATION AND DEBRIDEMENT LEFT HIP INCISION;  Surgeon: Kathryne Hitch, MD;  Location: [facility];  Service: Orthopedics;  Laterality: Left;  . JOINT REPLACEMENT  2011   total left knee  . KNEE ARTHROPLASTY  04/23/2012   right   . KNEE ARTHROSCOPY    . ROTATOR CUFF REPAIR     left   . SHOULDER SURGERY     right to repair ligament tear  . TOTAL HIP ARTHROPLASTY Left 09/09/2015   Procedure: LEFT TOTAL HIP ARTHROPLASTY ANTERIOR APPROACH;  Surgeon: Kathryne Hitch, MD;  Location: [facility];  Service: Orthopedics;  Laterality: Left;  . TOTAL KNEE ARTHROPLASTY  04/23/2012   Procedure: TOTAL KNEE ARTHROPLASTY;  Surgeon: [name], MD;  Location: [facility];  Service: Orthopedics;  Laterality: Right;  Right Total Knee Arthroplasty  . TUBAL LIGATION  1996   Social History   Occupational History  . Not on file  Tobacco Use  . Smoking status: Former Smoker    Packs/day: 0.50    Years: 4.00    Pack years: 2.00    Last attempt to quit: 09/18/1991    Years since quitting: 26.3  . Smokeless tobacco: Never Used  Substance and Sexual Activity  . Alcohol use: No  . Drug use: No  . Sexual activity: Yes    Birth control/protection: Surgical

## 2018-01-29 NOTE — Patient Instructions (Signed)
Avoid bending, stooping and avoid lifting weights greater than 10 lbs. Avoid prolong standing and walking. Avoid frequent bending and stooping  No lifting greater than 10 lbs. May use ice or moist heat for pain. Weight loss is of benefit. Handicap license is approved. Lumbar flexion exercises and exercises in sitting like a stationary bike are recommended, pool walking is also  Tolerated better than prolong standing and walking.

## 2018-02-19 ENCOUNTER — Telehealth (INDEPENDENT_AMBULATORY_CARE_PROVIDER_SITE_OTHER): Payer: Self-pay | Admitting: Specialist

## 2018-02-19 NOTE — Telephone Encounter (Signed)
Patient called advised she had  severe back spasm yesterday and had to have help getting home. The number to contact patient is [phone]

## 2018-02-19 NOTE — Telephone Encounter (Signed)
Patient called advised she had severe back spasm yesterday and had to have help getting home. ---She has a follow up appt on 03/10/18 

## 2018-02-19 NOTE — Telephone Encounter (Signed)
Patient called advised she had severe back spasm yesterday and had to have help getting home. ---She has a follow up appt on 03/10/18

## 2018-02-21 ENCOUNTER — Ambulatory Visit (INDEPENDENT_AMBULATORY_CARE_PROVIDER_SITE_OTHER): Payer: Medicare Other | Admitting: Specialist

## 2018-02-21 ENCOUNTER — Encounter (INDEPENDENT_AMBULATORY_CARE_PROVIDER_SITE_OTHER): Payer: Self-pay | Admitting: Specialist

## 2018-02-21 VITALS — BP 146/78 | HR 68 | Ht 63.0 in | Wt 330.0 lb

## 2018-02-21 DIAGNOSIS — M4317 Spondylolisthesis, lumbosacral region: Secondary | ICD-10-CM | POA: Diagnosis not present

## 2018-02-21 DIAGNOSIS — M4316 Spondylolisthesis, lumbar region: Secondary | ICD-10-CM

## 2018-02-21 DIAGNOSIS — M5116 Intervertebral disc disorders with radiculopathy, lumbar region: Secondary | ICD-10-CM

## 2018-02-21 DIAGNOSIS — M48062 Spinal stenosis, lumbar region with neurogenic claudication: Secondary | ICD-10-CM

## 2018-02-21 DIAGNOSIS — M5136 Other intervertebral disc degeneration, lumbar region: Secondary | ICD-10-CM | POA: Diagnosis not present

## 2018-02-21 NOTE — Patient Instructions (Signed)
Avoid bending, stooping and avoid lifting weights greater than 10 lbs. Avoid prolong standing and walking. Order for a new walker with wheels. Surgery scheduling secretary Sherri Billings, will call you in the next week to schedule for surgery.  Surgery recommended is a two level lumbar fusion L4-5 and L5-S1 this would be done with rods, screws and cages with local bone graft and allograft (donor bone graft). Take hydrocodone for for pain. Risk of surgery includes risk of infection 1 in 200 patients, bleeding 1/2% chance you would need a transfusion.   Risk to the nerves is one in 10,000. You will need to use a brace for 3 months and wean from the brace on the 4th month. Expect improved walking and standing tolerance. Expect relief of leg pain but numbness may persist depending on the length and degree of pressure that has been present.   

## 2018-02-21 NOTE — Progress Notes (Signed)
Office Visit Note   Patient: Marilyn [name]           Date of Birth: 12-02-1970           MRN: [id number] Visit Date: 02/21/2018              Requested by: Joette CatchingNyland, Leonard, MD [address] [place], [place] [id number] PCP: Joette CatchingNyland, Leonard, MD   Assessment & Plan: Visit Diagnoses:  1. Spondylolisthesis, lumbar region   2. Intervertebral disc disorders with radiculopathy, lumbar region   3. Degenerative disc disease, lumbar     Plan:Avoid bending, stooping and avoid lifting weights greater than 10 lbs. Avoid prolong standing and walking. Order for a new walker with wheels. Surgery scheduling secretary Tivis RingerSherri Billings, will call you in the next week to schedule for surgery.  Surgery recommended is a two level lumbar fusion L4-5 and L5-S1 this would be done with rods, screws and cages with local bone graft and allograft (donor bone graft). Take hydrocodone for for pain. Risk of surgery includes risk of infection 1 in 200 patients, bleeding 1/2% chance you would need a transfusion.   Risk to the nerves is one in 10,000. You will need to use a brace for 3 months and wean from the brace on the 4th month. Expect improved walking and standing tolerance. Expect relief of leg pain but numbness may persist depending on the length and degree of pressure that has been present.  Follow-Up Instructions: No follow-ups on file.   Orders:  No orders of the defined types were placed in this encounter.  No orders of the defined types were placed in this encounter.     Procedures: No procedures performed   Clinical Data: No additional findings.   Subjective: Chief Complaint  Patient presents with  . Lower Back - Pain, Spasms    47 year old female status post left total hip replacement for severe OA left hip complicated by a post op infection, THR 09/2015 and I&D of the left THR 10/2015. She is having persistent pain with standing and walking and difficulty even grocery shopping to  the point she uses an electric shopping cart to get around. She has had some problems with leg swelling and has been seen by her cardiologist and evaluated. I have not received formal note but she indicates that testing with an Echocardiogram  Returned okay for consideration of surgery. Her plain radiographs demonstrate spondylolisthesis at both L4-5 and L5-S1 with severe spondylosis changes. Her MRI from 04/2017 show the spondylolisthesis reduces in a supine position and there is an empty facet sign present at both L4-5 and L5-S1. She returns today for assessment. Has undergone 3 ESIs without improvement in the back and leg pain. She underwent PT post her hip replacements and was not able to return to standings.   Review of Systems   Objective: Vital Signs: BP (!) 146/78 (BP Location: Right Arm, Patient Position: Sitting, Cuff Size: Normal) Comment (BP Location): forearm  Pulse 68   Physical Exam  Constitutional: She is oriented to person, place, and time. She appears well-developed and well-nourished.  HENT:  Head: Normocephalic and atraumatic.  Eyes: Pupils are equal, round, and reactive to light. EOM are normal.  Neck: Normal range of motion. Neck supple.  Pulmonary/Chest: Effort normal and breath sounds normal.  Abdominal: Soft. Bowel sounds are normal.  Neurological: She is alert and oriented to person, place, and time.  Skin: Skin is warm and dry.  Psychiatric: She has a normal  mood and affect. Her behavior is normal. Judgment and thought content normal.    Back Exam   Tenderness  The patient is experiencing tenderness in the lumbar.  Range of Motion  Extension: abnormal  Flexion: abnormal  Lateral bend right: abnormal  Lateral bend left: abnormal  Rotation right: abnormal  Rotation left: abnormal   Muscle Strength  Right Quadriceps:  5/5  Left Quadriceps:  5/5  Right Hamstrings:  5/5  Left Hamstrings:  5/5   Tests  Straight leg raise right: negative  Reflexes    Patellar: 0/4 Achilles: 0/4 Babinski's sign: normal   Other  Toe walk: abnormal Heel walk: abnormal Gait: abnormal  Erythema: no back redness Scars: absent      Specialty Comments:  No specialty comments available.  Imaging: No results found.   PMFS History: Patient Active Problem List   Diagnosis Date Noted  . Pain of right hip joint 07/03/2017  . Acute right-sided low back pain with right-sided sciatica 03/27/2017  . Chronic right shoulder pain 02/13/2017  . History of rotator cuff tear 11/30/2016  . Impingement syndrome of right shoulder 11/30/2016  . Exacerbation of asthma 07/18/2016  . Hypokalemia 07/18/2016  . Hypertension 07/18/2016  . Depression with anxiety 07/18/2016  . Hypothyroidism 07/18/2016  . Hyperglycemia 07/18/2016  . Acute asthma exacerbation 07/18/2016  . Surgical wound dehiscence left hip; questionable superficial infection 11/10/2015  . Left hip postoperative wound infection 11/10/2015  . Osteoarthritis of left hip 09/09/2015  . 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 Obesity (BMI 35.0-39.9 without comorbidity) 04/28/2013   Past Medical History:  Diagnosis Date  . Anemia    taking iron now. pt states having no current issues 09/02/2015  . Anginal pain (HCC)    pt states experiences chest wall pain pt states related to her asthma   . Anxiety    with MRI's  . Arthritis   . Asthma   . Dizziness   . GERD (gastroesophageal reflux disease)   . Headache(784.0)    HX OF MIGRAINES  . History of bronchitis   . Hypertension   . Hypothyroidism    takes levothyroxen  . OSA on CPAP   . Shortness of breath    with exertion  . Wears glasses     Family History  Problem Relation Age of Onset  . Cancer Mother        colon  . Epilepsy Mother   . Cancer Father        prostate  . Diabetes Father   . Hypertension Father   . Hypertension Maternal Aunt   . Diabetes Maternal Aunt   . Hypertension Paternal Aunt     Past  Surgical History:  Procedure Laterality Date  . CESAREAN SECTION     times 2  . CHOLECYSTECTOMY    . ENDOMETRIAL ABLATION    . INCISION AND DRAINAGE HIP Left 11/10/2015   Procedure: IRRIGATION AND DEBRIDEMENT LEFT HIP INCISION;  Surgeon: Kathryne Hitch, MD;  Location: [facility];  Service: Orthopedics;  Laterality: Left;  . JOINT REPLACEMENT  2011   total left knee  . KNEE ARTHROPLASTY  04/23/2012   right   . KNEE ARTHROSCOPY    . ROTATOR CUFF REPAIR     left   . SHOULDER SURGERY     right to repair ligament tear  . TOTAL HIP ARTHROPLASTY Left 09/09/2015   Procedure: LEFT TOTAL HIP ARTHROPLASTY ANTERIOR APPROACH;  Surgeon: Kathryne Hitch, MD;  Location: [facility];  Service: Orthopedics;  Laterality: Left;  . TOTAL KNEE ARTHROPLASTY  04/23/2012   Procedure: TOTAL KNEE ARTHROPLASTY;  Surgeon: [name], MD;  Location: [facility];  Service: Orthopedics;  Laterality: Right;  Right Total Knee Arthroplasty  . TUBAL LIGATION  1996   Social History   Occupational History  . Not on file  Tobacco Use  . Smoking status: Former Smoker    Packs/day: 0.50    Years: 4.00    Pack years: 2.00    Last attempt to quit: 09/18/1991    Years since quitting: 26.4  . Smokeless tobacco: Never Used  Substance and Sexual Activity  . Alcohol use: No  . Drug use: No  . Sexual activity: Yes    Birth control/protection: Surgical

## 2018-02-28 ENCOUNTER — Telehealth (INDEPENDENT_AMBULATORY_CARE_PROVIDER_SITE_OTHER): Payer: Self-pay

## 2018-02-28 NOTE — Telephone Encounter (Signed)
I called and advised that she will be fitted for a brace after surgery at the hospital and that usually the rx for the walker will come from the hospital as well.

## 2018-02-28 NOTE — Telephone Encounter (Signed)
Patient left voice mail inquiring about walker with wheels and brace.  Please call.  Thanks! [phone]

## 2018-03-02 ENCOUNTER — Other Ambulatory Visit: Payer: Self-pay

## 2018-03-02 ENCOUNTER — Encounter (HOSPITAL_COMMUNITY): Payer: Self-pay | Admitting: Emergency Medicine

## 2018-03-02 ENCOUNTER — Emergency Department (HOSPITAL_COMMUNITY)
Admission: EM | Admit: 2018-03-02 | Discharge: 2018-03-02 | Disposition: A | Discharge status: Home / Self Care | Payer: Medicare Other | Attending: Emergency Medicine | Admitting: Emergency Medicine

## 2018-03-02 DIAGNOSIS — D649 Anemia, unspecified: Secondary | ICD-10-CM | POA: Insufficient documentation

## 2018-03-02 DIAGNOSIS — J45909 Unspecified asthma, uncomplicated: Secondary | ICD-10-CM | POA: Diagnosis not present

## 2018-03-02 DIAGNOSIS — Z79899 Other long term (current) drug therapy: Secondary | ICD-10-CM | POA: Diagnosis not present

## 2018-03-02 DIAGNOSIS — R112 Nausea with vomiting, unspecified: Secondary | ICD-10-CM | POA: Diagnosis not present

## 2018-03-02 DIAGNOSIS — N390 Urinary tract infection, site not specified: Secondary | ICD-10-CM

## 2018-03-02 DIAGNOSIS — R1110 Vomiting, unspecified: Secondary | ICD-10-CM | POA: Diagnosis present

## 2018-03-02 DIAGNOSIS — Z87891 Personal history of nicotine dependence: Secondary | ICD-10-CM | POA: Diagnosis not present

## 2018-03-02 DIAGNOSIS — R197 Diarrhea, unspecified: Secondary | ICD-10-CM | POA: Insufficient documentation

## 2018-03-02 DIAGNOSIS — E039 Hypothyroidism, unspecified: Secondary | ICD-10-CM | POA: Diagnosis not present

## 2018-03-02 DIAGNOSIS — I10 Essential (primary) hypertension: Secondary | ICD-10-CM | POA: Insufficient documentation

## 2018-03-02 LAB — URINALYSIS, ROUTINE W REFLEX MICROSCOPIC
Bilirubin Urine: NEGATIVE
Glucose, UA: NEGATIVE mg/dL
Hgb urine dipstick: NEGATIVE
Ketones, ur: 5 mg/dL — AB
Leukocytes, UA: NEGATIVE
Nitrite: POSITIVE — AB
PH: 9 — AB (ref 5.0–8.0)
Protein, ur: NEGATIVE mg/dL
SPECIFIC GRAVITY, URINE: 1.015 (ref 1.005–1.030)

## 2018-03-02 LAB — CBC WITH DIFFERENTIAL/PLATELET
Basophils Absolute: 0 10*3/uL (ref 0.0–0.1)
Basophils Relative: 0 %
Eosinophils Absolute: 0.4 10*3/uL (ref 0.0–0.7)
Eosinophils Relative: 5 %
HCT: 34.8 % — ABNORMAL LOW (ref 36.0–46.0)
Hemoglobin: 10.8 g/dL — ABNORMAL LOW (ref 12.0–15.0)
LYMPHS PCT: 33 %
Lymphs Abs: 2.6 10*3/uL (ref 0.7–4.0)
MCH: 26.2 pg (ref 26.0–34.0)
MCHC: 31 g/dL (ref 30.0–36.0)
MCV: 84.5 fL (ref 78.0–100.0)
MONO ABS: 0.4 10*3/uL (ref 0.1–1.0)
MONOS PCT: 6 %
NEUTROS ABS: 4.4 10*3/uL (ref 1.7–7.7)
Neutrophils Relative %: 56 %
Platelets: 291 10*3/uL (ref 150–400)
RBC: 4.12 MIL/uL (ref 3.87–5.11)
RDW: 13.7 % (ref 11.5–15.5)
WBC: 7.8 10*3/uL (ref 4.0–10.5)

## 2018-03-02 LAB — COMPREHENSIVE METABOLIC PANEL
ALBUMIN: 4.2 g/dL (ref 3.5–5.0)
ALK PHOS: 61 U/L (ref 38–126)
ALT: 44 U/L (ref 14–54)
ANION GAP: 10 (ref 5–15)
AST: 35 U/L (ref 15–41)
BUN: 19 mg/dL (ref 6–20)
CO2: 23 mmol/L (ref 22–32)
Calcium: 9.6 mg/dL (ref 8.9–10.3)
Chloride: 104 mmol/L (ref 101–111)
Creatinine, Ser: 1.08 mg/dL — ABNORMAL HIGH (ref 0.44–1.00)
GFR calc Af Amer: >60 mL/min (ref >60)
GFR calc non Af Amer: 59 mL/min — ABNORMAL LOW (ref >60)
Glucose, Bld: 120 mg/dL — ABNORMAL HIGH (ref 65–99)
POTASSIUM: 3.3 mmol/L — AB (ref 3.5–5.1)
SODIUM: 137 mmol/L (ref 135–145)
Total Bilirubin: 0.8 mg/dL (ref 0.3–1.2)
Total Protein: 7.9 g/dL (ref 6.5–8.1)

## 2018-03-02 LAB — LIPASE, BLOOD: Lipase: 20 U/L (ref 11–51)

## 2018-03-02 MED ORDER — ONDANSETRON HCL 4 MG/2ML IJ SOLN
4.0000 mg | Freq: Once | INTRAMUSCULAR | Status: DC
  Filled 2018-03-02: qty 2

## 2018-03-02 MED ORDER — ALBUTEROL SULFATE HFA 108 (90 BASE) MCG/ACT IN AERS: 2.0000 | INHALATION_SPRAY | RESPIRATORY_TRACT | Status: DC | PRN

## 2018-03-02 MED ORDER — SODIUM CHLORIDE 0.9 % IV BOLUS: 1000.0000 mL | Freq: Once | INTRAVENOUS | Status: DC

## 2018-03-02 MED ORDER — NITROFURANTOIN MACROCRYSTAL 100 MG PO CAPS
100.0000 mg | ORAL_CAPSULE | Freq: Once | ORAL | Status: AC
  Administered 2018-03-02: 100 mg via ORAL
  Filled 2018-03-02: qty 2
  Filled 2018-03-02: qty 1

## 2018-03-02 MED ORDER — NITROFURANTOIN MONOHYD MACRO 100 MG PO CAPS: 100.0000 mg | ORAL_CAPSULE | Freq: Two times a day (BID) | ORAL | 0 refills | Status: DC

## 2018-03-02 MED ORDER — ONDANSETRON 8 MG PO TBDP
8.0000 mg | ORAL_TABLET | Freq: Once | ORAL | Status: AC
  Administered 2018-03-02: 8 mg via ORAL
  Filled 2018-03-02: qty 1

## 2018-03-02 MED ORDER — ONDANSETRON HCL 4 MG PO TABS: 4.0000 mg | ORAL_TABLET | Freq: Three times a day (TID) | ORAL | 0 refills | Status: DC | PRN

## 2018-03-02 NOTE — Discharge Instructions (Addendum)
Take loperamide (Imodium A-D) as needed for diarrhea. ?

## 2018-03-02 NOTE — ED Notes (Signed)
Pt ambulated to bathroom to give nurse urine sample.

## 2018-03-02 NOTE — ED Notes (Signed)
Pt denies nausea at this time.

## 2018-03-02 NOTE — ED Provider Notes (Signed)
[facility] EMERGENCY DEPARTMENT Provider Note   CSN: [id number] Arrival date & time: 03/02/18  0144     History   Chief Complaint Chief Complaint  Patient presents with  . Emesis    HPI Marilyn [name] is a 47 y.o. female.  The history is provided by the patient.  She has history of hypertension, asthma, GERD and comes in with vomiting and diarrhea this evening.  She states she has vomited 6-8 times, and had 2 episodes of diarrhea.  This has been associated with some epigastric pain which feels like excess stomach acid.  Pain was 6/10, but has subsided to 4/10.  There is no radiation of pain to the back, chest, shoulders.  She denies fever, but has had chills and sweats.  She also relates that she has not eaten in the last 2 days, but states that is not unusual for her.  She denies any sick contacts and denies any suspicious food exposure.  Past Medical History:  Diagnosis Date  . Anemia    taking iron now. pt states having no current issues 09/02/2015  . Anginal pain ([place])    pt states experiences chest wall pain pt states related to her asthma   . Anxiety    with MRI's  . Arthritis   . Asthma   . Dizziness   . GERD (gastroesophageal reflux disease)   . Headache(784.0)    HX OF MIGRAINES  . History of bronchitis   . Hypertension   . Hypothyroidism    takes levothyroxen  . OSA on CPAP   . Shortness of breath    with exertion  . Wears glasses     Patient Active Problem List   Diagnosis Date Noted  . Pain of right hip joint 07/03/2017  . Acute right-sided low back pain with right-sided sciatica 03/27/2017  . Chronic right shoulder pain 02/13/2017  . History of rotator cuff tear 11/30/2016  . Impingement syndrome of right shoulder 11/30/2016  . Exacerbation of asthma 07/18/2016  . Hypokalemia 07/18/2016  . Hypertension 07/18/2016  . Depression with anxiety 07/18/2016  . Hypothyroidism 07/18/2016  . Hyperglycemia 07/18/2016  . Acute asthma exacerbation  07/18/2016  . Surgical wound dehiscence left hip; questionable superficial infection 11/10/2015  . Left hip postoperative wound infection 11/10/2015  . Osteoarthritis of left hip 09/09/2015  . 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 Obesity (BMI 35.0-39.9 without comorbidity) 04/28/2013    Past Surgical History:  Procedure Laterality Date  . CESAREAN SECTION     times 2  . CHOLECYSTECTOMY    . ENDOMETRIAL ABLATION    . INCISION AND DRAINAGE HIP Left 11/10/2015   Procedure: IRRIGATION AND DEBRIDEMENT LEFT HIP INCISION;  Surgeon: Mcarthur Rossetti, MD;  Location: [place];  Service: Orthopedics;  Laterality: Left;  . JOINT REPLACEMENT  2011   total left knee  . KNEE ARTHROPLASTY  04/23/2012   right   . KNEE ARTHROSCOPY    . ROTATOR CUFF REPAIR     left   . SHOULDER SURGERY     right to repair ligament tear  . TOTAL HIP ARTHROPLASTY Left 09/09/2015   Procedure: LEFT TOTAL HIP ARTHROPLASTY ANTERIOR APPROACH;  Surgeon: Mcarthur Rossetti, MD;  Location: [facility];  Service: Orthopedics;  Laterality: Left;  . TOTAL KNEE ARTHROPLASTY  04/23/2012   Procedure: TOTAL KNEE ARTHROPLASTY;  Surgeon: Newt Minion, MD;  Location: [place];  Service: Orthopedics;  Laterality: Right;  Right Total Knee Arthroplasty  .  TUBAL LIGATION  1996     OB History    Gravida  2   Para  2   Term  2   Preterm      AB      Living  2     SAB      TAB      Ectopic      Multiple      Live Births               Home Medications    Prior to Admission medications   Medication Sig Start Date End Date Taking? Authorizing Provider  albuterol (PROVENTIL HFA;VENTOLIN HFA) 108 (90 BASE) MCG/ACT inhaler Inhale 2 puffs into the lungs every 6 (six) hours as needed for wheezing or shortness of breath.    [provider]  albuterol (PROVENTIL) (2.5 MG/3ML) 0.083% nebulizer solution Take 3 mLs (2.5 mg total) by nebulization 3 (three) times daily. 07/19/16   Thurnell Lose, MD    buprenorphine (SUBUTEX) 8 MG SUBL SL tablet Place 8 mg under the tongue daily.    [provider]  cyclobenzaprine (FLEXERIL) 10 MG tablet TAKE 1 TABLET BY MOUTH THREE TIMES DAILY AS NEEDED FOR MUSCLE SPASM 05/02/17   Mcarthur Rossetti, MD  diclofenac (VOLTAREN) 75 MG EC tablet Take 1 tablet (75 mg total) by mouth 2 (two) times daily. 01/29/18   Jessy Oto, MD  diphenhydrAMINE (BENADRYL) 25 MG tablet Take 1 tablet (25 mg total) by mouth every 6 (six) hours. 09/29/15   Jola Schmidt, MD  DULoxetine (CYMBALTA) 60 MG capsule  06/22/17   [provider]  FLUZONE QUADRIVALENT 0.5 ML injection TO BE ADMINISTERED BY PHARMACIST FOR IMMUNIZATION 07/20/17   [provider]  furosemide (LASIX) 40 MG tablet Take one po bid x 2days prn swelling 11/01/17   [provider]  gabapentin (NEURONTIN) 300 MG capsule Take 1 capsule (300 mg total) by mouth 2 (two) times daily. 01/29/18   Jessy Oto, MD  hydrochlorothiazide 25 MG tablet Take 25 mg by mouth daily.     [provider]  levothyroxine (SYNTHROID, LEVOTHROID) 112 MCG tablet  05/31/17   [provider]  mometasone-formoterol (DULERA) 200-5 MCG/ACT AERO Inhale into the lungs.    [provider]  NARCAN 4 MG/0.1ML LIQD nasal spray kit  06/28/17   [provider]  oxybutynin (DITROPAN-XL) 10 MG 24 hr tablet TAKE ONE TABLET BY MOUTH ONCE DAILY 05/31/17   [provider]  oxyCODONE-acetaminophen (PERCOCET/ROXICET) 5-325 MG tablet Take 1 tablet by mouth daily as needed. 02/06/18   [provider]  RESTASIS MULTIDOSE 0.05 % ophthalmic emulsion Place 1 drop into both eyes 2 (two) times daily.  08/12/16   [provider]    Family History Family History  Problem Relation Age of Onset  . Cancer Mother        colon  . Epilepsy Mother   . Cancer Father        prostate  . Diabetes Father   . Hypertension Father   . Hypertension Maternal Aunt   . Diabetes  Maternal Aunt   . Hypertension Paternal Aunt     Social History Social History   Tobacco Use  . Smoking status: Former Smoker    Packs/day: 0.50    Years: 4.00    Pack years: 2.00    Last attempt to quit: 09/18/1991    Years since quitting: 26.4  . Smokeless tobacco: Never Used  Substance Use  Topics  . Alcohol use: No  . Drug use: No     Allergies   Lisinopril; Bee venom; Other; Propoxyphene; Meloxicam; Codeine; Latex; Morphine and related; and Tomato   Review of Systems Review of Systems  All other systems reviewed and are negative.    Physical Exam Updated Vital Signs BP 137/82 (BP Location: Right Arm)   Pulse 62   Temp 97.6 F (36.4 C) (Oral)   Resp (!) 25   Ht _0  (1.6 m)   Wt (!) 149.7 kg (330 lb)   SpO2 100%   BMI 58.46 kg/m   Physical Exam  Nursing note and vitals reviewed.  47 year old female, resting comfortably and in no acute distress. Vital signs are significant for rapid respiratory rate. Oxygen saturation is 100%, which is normal. Head is normocephalic and atraumatic. PERRLA, EOMI. Oropharynx is clear. Neck is nontender and supple without adenopathy or JVD. Back is nontender and there is no CVA tenderness. Lungs are clear without rales, wheezes, or rhonchi. Chest is nontender. Heart has regular rate and rhythm without murmur. Abdomen is soft, flat, with mild epigastric tenderness.  There is no rebound or guarding.  There are no masses or hepatosplenomegaly and peristalsis is hypoactive. Extremities have 2+ edema, full range of motion is present. Skin is warm and dry without rash. Neurologic: Mental status is normal, cranial nerves are intact, there are no motor or sensory deficits.  ED Treatments / Results  Labs (all labs ordered are listed, but only abnormal results are displayed) Labs Reviewed  COMPREHENSIVE METABOLIC PANEL - Abnormal; Notable for the following components:      Result Value   Potassium 3.3 (*)    Glucose, Bld 120 (*)      Creatinine, Ser 1.08 (*)    GFR calc non Af Amer 59 (*)    All other components within normal limits  CBC WITH DIFFERENTIAL/PLATELET - Abnormal; Notable for the following components:   Hemoglobin 10.8 (*)    HCT 34.8 (*)    All other components within normal limits  URINALYSIS, ROUTINE W REFLEX MICROSCOPIC - Abnormal; Notable for the following components:   APPearance HAZY (*)    pH 9.0 (*)    Ketones, ur 5 (*)    Nitrite POSITIVE (*)    Bacteria, UA MANY (*)    All other components within normal limits  LIPASE, BLOOD   Procedures Procedures   Medications Ordered in ED Medications  nitrofurantoin (MACRODANTIN) capsule 100 mg (has no administration in time range)  ondansetron (ZOFRAN-ODT) disintegrating tablet 8 mg (8 mg Oral Given 03/02/18 0315)     Initial Impression / Assessment and Plan / ED Course  I have reviewed the triage vital signs and the nursing notes.  Pertinent lab results that were available during my care of the patient were reviewed by me and considered in my medical decision making (see chart for details).  Nausea, vomiting, diarrhea.  Pattern suspicious for viral gastroenteritis.  No red flags this is a just more serious illness.  Will check screening labs, give IV fluids, IV ondansetron.  Old records are reviewed, and she has no relevant past visits.  Unfortunately, IV access was not able to be established.  She was given oral dissolving tablet ondansetron with good relief of nausea.  Labs show mild hypokalemia which is presumably secondary to vomiting.  This should correct with normal diet without additional supplementation.  Urinalysis does show evidence of UTI, and she is given a prescription for  nitrofurantoin.  Mild anemia is present, and it is noted that she has had anemia before.  She is discharged with a take-home pack of ondansetron in addition to the prescription for nitrofurantoin.  Final Clinical Impressions(s) / ED Diagnoses   Final diagnoses:   Nausea vomiting and diarrhea  Urinary tract infection without hematuria, site unspecified  Normochromic normocytic anemia    ED Discharge Orders        Ordered    ondansetron (ZOFRAN) 4 MG tablet  Every 8 hours PRN     03/02/18 0528    nitrofurantoin, macrocrystal-monohydrate, (MACROBID) 100 MG capsule  2 times daily     65/03/54 6568       Delora Fuel, MD 12/75/17 [phone]

## 2018-03-02 NOTE — ED Triage Notes (Signed)
Pt c/o emesis since 2000 03/01/18 x 6 episodes with 2 episodes of diarrhea, no fever

## 2018-03-02 NOTE — ED Notes (Signed)
IV access attempted x 2 without success. Dr Preston FleetingGlick made aware- new orders received.  Tori, from lab made aware of need for blood draw.

## 2018-03-02 NOTE — ED Notes (Signed)
Lab at bedside

## 2018-03-03 ENCOUNTER — Emergency Department (HOSPITAL_COMMUNITY)
Admission: EM | Admit: 2018-03-03 | Discharge: 2018-03-03 | Disposition: A | Discharge status: Home / Self Care | Payer: Medicare Other | Attending: Emergency Medicine | Admitting: Emergency Medicine

## 2018-03-03 ENCOUNTER — Other Ambulatory Visit: Payer: Self-pay

## 2018-03-03 ENCOUNTER — Encounter (HOSPITAL_COMMUNITY): Payer: Self-pay

## 2018-03-03 DIAGNOSIS — Z96642 Presence of left artificial hip joint: Secondary | ICD-10-CM | POA: Diagnosis not present

## 2018-03-03 DIAGNOSIS — Z96653 Presence of artificial knee joint, bilateral: Secondary | ICD-10-CM | POA: Insufficient documentation

## 2018-03-03 DIAGNOSIS — J45909 Unspecified asthma, uncomplicated: Secondary | ICD-10-CM | POA: Diagnosis not present

## 2018-03-03 DIAGNOSIS — I10 Essential (primary) hypertension: Secondary | ICD-10-CM | POA: Insufficient documentation

## 2018-03-03 DIAGNOSIS — N390 Urinary tract infection, site not specified: Secondary | ICD-10-CM | POA: Insufficient documentation

## 2018-03-03 DIAGNOSIS — Z91040 Latex allergy status: Secondary | ICD-10-CM | POA: Insufficient documentation

## 2018-03-03 DIAGNOSIS — Z87891 Personal history of nicotine dependence: Secondary | ICD-10-CM | POA: Diagnosis not present

## 2018-03-03 DIAGNOSIS — R112 Nausea with vomiting, unspecified: Secondary | ICD-10-CM | POA: Insufficient documentation

## 2018-03-03 DIAGNOSIS — Z79899 Other long term (current) drug therapy: Secondary | ICD-10-CM | POA: Diagnosis not present

## 2018-03-03 DIAGNOSIS — E039 Hypothyroidism, unspecified: Secondary | ICD-10-CM | POA: Insufficient documentation

## 2018-03-03 LAB — COMPREHENSIVE METABOLIC PANEL
ALBUMIN: 3.8 g/dL (ref 3.5–5.0)
ALT: 39 U/L (ref 14–54)
ANION GAP: 8 (ref 5–15)
AST: 32 U/L (ref 15–41)
Alkaline Phosphatase: 58 U/L (ref 38–126)
BUN: 17 mg/dL (ref 6–20)
CO2: 21 mmol/L — AB (ref 22–32)
Calcium: 9.3 mg/dL (ref 8.9–10.3)
Chloride: 111 mmol/L (ref 101–111)
Creatinine, Ser: 1.16 mg/dL — ABNORMAL HIGH (ref 0.44–1.00)
GFR calc non Af Amer: 54 mL/min — ABNORMAL LOW (ref >60)
GLUCOSE: 113 mg/dL — AB (ref 65–99)
POTASSIUM: 3.4 mmol/L — AB (ref 3.5–5.1)
SODIUM: 140 mmol/L (ref 135–145)
Total Bilirubin: 0.8 mg/dL (ref 0.3–1.2)
Total Protein: 7.3 g/dL (ref 6.5–8.1)

## 2018-03-03 LAB — CBC WITH DIFFERENTIAL/PLATELET
BASOS PCT: 0 %
Basophils Absolute: 0 10*3/uL (ref 0.0–0.1)
Eosinophils Absolute: 0.2 10*3/uL (ref 0.0–0.7)
Eosinophils Relative: 3 %
HEMATOCRIT: 35.2 % — AB (ref 36.0–46.0)
HEMOGLOBIN: 10.7 g/dL — AB (ref 12.0–15.0)
Lymphocytes Relative: 35 %
Lymphs Abs: 2.5 10*3/uL (ref 0.7–4.0)
MCH: 26 pg (ref 26.0–34.0)
MCHC: 30.4 g/dL (ref 30.0–36.0)
MCV: 85.4 fL (ref 78.0–100.0)
MONOS PCT: 5 %
Monocytes Absolute: 0.4 10*3/uL (ref 0.1–1.0)
NEUTROS ABS: 4 10*3/uL (ref 1.7–7.7)
NEUTROS PCT: 57 %
Platelets: 268 10*3/uL (ref 150–400)
RBC: 4.12 MIL/uL (ref 3.87–5.11)
RDW: 13.6 % (ref 11.5–15.5)
WBC: 7 10*3/uL (ref 4.0–10.5)

## 2018-03-03 LAB — I-STAT BETA HCG BLOOD, ED (MC, WL, AP ONLY)

## 2018-03-03 LAB — LIPASE, BLOOD: Lipase: 17 U/L (ref 11–51)

## 2018-03-03 MED ORDER — NITROFURANTOIN MONOHYD MACRO 100 MG PO CAPS: 100.0000 mg | ORAL_CAPSULE | Freq: Two times a day (BID) | ORAL | 0 refills | Status: DC

## 2018-03-03 MED ORDER — SODIUM CHLORIDE 0.9 % IV SOLN
1.0000 g | Freq: Once | INTRAVENOUS | Status: AC
  Administered 2018-03-03: 1 g via INTRAVENOUS
  Filled 2018-03-03: qty 10

## 2018-03-03 MED ORDER — SODIUM CHLORIDE 0.9 % IV SOLN
INTRAVENOUS | Status: DC
  Administered 2018-03-03: 15:00:00 via INTRAVENOUS

## 2018-03-03 MED ORDER — SODIUM CHLORIDE 0.9 % IV BOLUS
1000.0000 mL | Freq: Once | INTRAVENOUS | Status: AC
  Administered 2018-03-03: 1000 mL via INTRAVENOUS

## 2018-03-03 MED ORDER — ONDANSETRON 8 MG PO TBDP: 8.0000 mg | ORAL_TABLET | Freq: Three times a day (TID) | ORAL | 0 refills | Status: DC | PRN

## 2018-03-03 MED ORDER — ONDANSETRON HCL 4 MG/2ML IJ SOLN
4.0000 mg | Freq: Once | INTRAMUSCULAR | Status: AC
  Administered 2018-03-03: 4 mg via INTRAVENOUS
  Filled 2018-03-03: qty 2

## 2018-03-03 MED FILL — Ondansetron HCl Tab 4 MG: ORAL | Qty: 4 | Status: AC

## 2018-03-03 NOTE — ED Triage Notes (Signed)
Pt reports n/v/d since Friday.  Was evaluated here yesterday and diagnosed with UTI.  Pt says hasn't gotten her antibiotics filled yet.  Pt returned today because still having n/v/d.

## 2018-03-03 NOTE — Discharge Instructions (Addendum)
Make sure to start the antibiotics that were prescribed the other day, I have given you another in prescription in case he lost that one, take the antinausea medications as needed.  Follow-up with your doctor later this week if you are not feeling any better.

## 2018-03-03 NOTE — ED Notes (Signed)
Pt given urinal to obtain UA sample.

## 2018-03-03 NOTE — ED Provider Notes (Signed)
[facility] EMERGENCY DEPARTMENT Provider Note   CSN: [id number] Arrival date & time: 03/03/18  1126     History   Chief Complaint Chief Complaint  Patient presents with  . Emesis    HPI Marilyn [name] is a 47 y.o. female.  HPI Pt stared having trouble with NVD since Friday.  She has not been able to keep anything down.  She has not been able to eat anything since Friday.  The vomiting has decreased somewhat but she still vomited a couple times yesterday and once today and still has a few loose stools per day.  Pt was seen in the ED yesterday early am.  She was discharged with zofran in hand and rx for abx and instructed to get imodium.  Pt did not pick up her prescriptions.  She is not feeling any better today and came back to the ED. Past Medical History:  Diagnosis Date  . Anemia    taking iron now. pt states having no current issues 09/02/2015  . Anginal pain ([place])    pt states experiences chest wall pain pt states related to her asthma   . Anxiety    with MRI's  . Arthritis   . Asthma   . Dizziness   . GERD (gastroesophageal reflux disease)   . Headache(784.0)    HX OF MIGRAINES  . History of bronchitis   . Hypertension   . Hypothyroidism    takes levothyroxen  . OSA on CPAP   . Shortness of breath    with exertion  . Wears glasses     Patient Active Problem List   Diagnosis Date Noted  . Pain of right hip joint 07/03/2017  . Acute right-sided low back pain with right-sided sciatica 03/27/2017  . Chronic right shoulder pain 02/13/2017  . History of rotator cuff tear 11/30/2016  . Impingement syndrome of right shoulder 11/30/2016  . Exacerbation of asthma 07/18/2016  . Hypokalemia 07/18/2016  . Hypertension 07/18/2016  . Depression with anxiety 07/18/2016  . Hypothyroidism 07/18/2016  . Hyperglycemia 07/18/2016  . Acute asthma exacerbation 07/18/2016  . Surgical wound dehiscence left hip; questionable superficial infection 11/10/2015  . Left hip  postoperative wound infection 11/10/2015  . Osteoarthritis of left hip 09/09/2015  . 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 Obesity (BMI 35.0-39.9 without comorbidity) 04/28/2013    Past Surgical History:  Procedure Laterality Date  . CESAREAN SECTION     times 2  . CHOLECYSTECTOMY    . ENDOMETRIAL ABLATION    . INCISION AND DRAINAGE HIP Left 11/10/2015   Procedure: IRRIGATION AND DEBRIDEMENT LEFT HIP INCISION;  Surgeon: Mcarthur Rossetti, MD;  Location: [place];  Service: Orthopedics;  Laterality: Left;  . JOINT REPLACEMENT  2011   total left knee  . KNEE ARTHROPLASTY  04/23/2012   right   . KNEE ARTHROSCOPY    . ROTATOR CUFF REPAIR     left   . SHOULDER SURGERY     right to repair ligament tear  . TOTAL HIP ARTHROPLASTY Left 09/09/2015   Procedure: LEFT TOTAL HIP ARTHROPLASTY ANTERIOR APPROACH;  Surgeon: Mcarthur Rossetti, MD;  Location: [facility];  Service: Orthopedics;  Laterality: Left;  . TOTAL KNEE ARTHROPLASTY  04/23/2012   Procedure: TOTAL KNEE ARTHROPLASTY;  Surgeon: Newt Minion, MD;  Location: [place];  Service: Orthopedics;  Laterality: Right;  Right Total Knee Arthroplasty  . TUBAL LIGATION  1996     OB History  Gravida  2   Para  2   Term  2   Preterm      AB      Living  2     SAB      TAB      Ectopic      Multiple      Live Births               Home Medications    Prior to Admission medications   Medication Sig Start Date End Date Taking? Authorizing Provider  albuterol (PROVENTIL HFA;VENTOLIN HFA) 108 (90 BASE) MCG/ACT inhaler Inhale 2 puffs into the lungs every 6 (six) hours as needed for wheezing or shortness of breath.   Yes [provider]  albuterol (PROVENTIL) (2.5 MG/3ML) 0.083% nebulizer solution Take 3 mLs (2.5 mg total) by nebulization 3 (three) times daily. 07/19/16  Yes Thurnell Lose, MD  buprenorphine (SUBUTEX) 8 MG SUBL SL tablet Place 8 mg under the tongue daily.   Yes [provider]  cetirizine (ZYRTEC) 10 MG tablet Take 10 mg by mouth daily.   Yes [provider]  cyclobenzaprine (FLEXERIL) 10 MG tablet TAKE 1 TABLET BY MOUTH THREE TIMES DAILY AS NEEDED FOR MUSCLE SPASM 05/02/17  Yes Mcarthur Rossetti, MD  diclofenac (VOLTAREN) 75 MG EC tablet Take 1 tablet (75 mg total) by mouth 2 (two) times daily. 01/29/18  Yes Jessy Oto, MD  diphenhydrAMINE (BENADRYL) 25 MG tablet Take 1 tablet (25 mg total) by mouth every 6 (six) hours. 09/29/15  Yes Jola Schmidt, MD  DULoxetine (CYMBALTA) 60 MG capsule Take 60 mg by mouth daily.  06/22/17  Yes [provider]  ergocalciferol (VITAMIN D2) 50000 units capsule Take 50,000 Units by mouth once a week.   Yes [provider]  famotidine (PEPCID) 20 MG tablet Take 20 mg by mouth 2 (two) times daily.   Yes [provider]  furosemide (LASIX) 20 MG tablet Take one tablet by mouth daily prn swelling 11/01/17  Yes [provider]  gabapentin (NEURONTIN) 300 MG capsule Take 1 capsule (300 mg total) by mouth 2 (two) times daily. 01/29/18  Yes Jessy Oto, MD  hydrochlorothiazide 25 MG tablet Take 25 mg by mouth daily.    Yes [provider]  levothyroxine (SYNTHROID, LEVOTHROID) 112 MCG tablet Take 112 mcg by mouth daily before breakfast.  05/31/17  Yes [provider]  NARCAN 4 MG/0.1ML LIQD nasal spray kit  06/28/17  Yes [provider]  ondansetron (ZOFRAN) 4 MG tablet Take 1 tablet (4 mg total) by mouth every 8 (eight) hours as needed for nausea or vomiting. 9/38/18  Yes Delora Fuel, MD  oxybutynin (DITROPAN-XL) 10 MG 24 hr tablet TAKE ONE TABLET BY MOUTH ONCE DAILY 05/31/17  Yes [provider]  oxyCODONE-acetaminophen (PERCOCET/ROXICET) 5-325 MG tablet Take 1 tablet by mouth daily as needed. 02/06/18  Yes [provider]  RESTASIS MULTIDOSE 0.05 % ophthalmic emulsion Place 1 drop into both eyes 2 (two) times daily.  08/12/16  Yes  [provider]  Topiramate ER (TROKENDI XR) 100 MG CP24 Take 1 capsule by mouth daily.   Yes [provider]  nitrofurantoin, macrocrystal-monohydrate, (MACROBID) 100 MG capsule Take 1 capsule (100 mg total) by mouth 2 (two) times daily. X 7 days 03/03/18   Dorie Rank, MD  ondansetron (ZOFRAN ODT) 8 MG disintegrating tablet Take 1 tablet (8 mg total) by mouth every 8 (eight) hours as needed for nausea  or vomiting. 03/03/18   Dorie Rank, MD    Family History Family History  Problem Relation Age of Onset  . Cancer Mother        colon  . Epilepsy Mother   . Cancer Father        prostate  . Diabetes Father   . Hypertension Father   . Hypertension Maternal Aunt   . Diabetes Maternal Aunt   . Hypertension Paternal Aunt     Social History Social History   Tobacco Use  . Smoking status: Former Smoker    Packs/day: 0.50    Years: 4.00    Pack years: 2.00    Last attempt to quit: 09/18/1991    Years since quitting: 26.4  . Smokeless tobacco: Never Used  Substance Use Topics  . Alcohol use: No  . Drug use: No     Allergies   Lisinopril; Bee venom; Other; Propoxyphene; Meloxicam; Codeine; Latex; Morphine and related; and Tomato   Review of Systems Review of Systems  Constitutional: Negative for fever.  Respiratory: Negative for cough.   Cardiovascular: Negative for chest pain.  Gastrointestinal: Negative for abdominal pain.  Genitourinary: Negative for dysuria.  Neurological: Positive for weakness.  All other systems reviewed and are negative.    Physical Exam Updated Vital Signs BP (!) 170/91 (BP Location: Left Arm)   Pulse 60   Temp 98.2 F (36.8 C) (Oral)   Resp 18   Ht 1.6 m (5' 3")   Wt 136.1 kg (300 lb)   SpO2 99%   BMI 53.14 kg/m   Physical Exam  HENT:  Head: Normocephalic and atraumatic.  Right Ear: External ear normal.  Left Ear: External ear normal.  Eyes: Conjunctivae are normal. Right eye exhibits no discharge. Left eye exhibits  no discharge. No scleral icterus.  Neck: Neck supple. No tracheal deviation present.  Cardiovascular: Normal rate, regular rhythm and intact distal pulses.  Pulmonary/Chest: Effort normal and breath sounds normal. No stridor. No respiratory distress. She has no wheezes. She has no rales.  Abdominal: Soft. Bowel sounds are normal. She exhibits no distension. There is no tenderness. There is no rebound and no guarding.  Musculoskeletal: She exhibits no edema or tenderness.  Neurological: She is alert. She has normal strength. No cranial nerve deficit (no facial droop, extraocular movements intact, no slurred speech) or sensory deficit. She exhibits normal muscle tone. She displays no seizure activity. Coordination normal.  Skin: Skin is warm and dry. No rash noted. She is not diaphoretic.  Psychiatric: She has a normal mood and affect.  Nursing note and vitals reviewed.    ED Treatments / Results  Labs (all labs ordered are listed, but only abnormal results are displayed) Labs Reviewed  COMPREHENSIVE METABOLIC PANEL - Abnormal; Notable for the following components:      Result Value   Potassium 3.4 (*)    CO2 21 (*)    Glucose, Bld 113 (*)    Creatinine, Ser 1.16 (*)    GFR calc non Af Amer 54 (*)    All other components within normal limits  CBC WITH DIFFERENTIAL/PLATELET - Abnormal; Notable for the following components:   Hemoglobin 10.7 (*)    HCT 35.2 (*)    All other components within normal limits  LIPASE, BLOOD  I-STAT BETA HCG BLOOD, ED ([facility], [facility], [facility] ONLY)    EKG None  Radiology No results found.  Procedures Procedures (including critical care time)  Medications Ordered in ED Medications  sodium chloride  0.9 % bolus 1,000 mL (0 mLs Intravenous Stopped 03/03/18 1509)  ondansetron (ZOFRAN) injection 4 mg (4 mg Intravenous Given 03/03/18 1216)  cefTRIAXone (ROCEPHIN) 1 g in sodium chloride 0.9 % 100 mL IVPB (0 g Intravenous Stopped 03/03/18 1620)     Initial Impression  / Assessment and Plan / ED Course  I have reviewed the triage vital signs and the nursing notes.  Pertinent labs & imaging results that were available during my care of the patient were reviewed by me and considered in my medical decision making (see chart for details).  Clinical Course as of Mar 03 2210  Mon Mar 03, 2018  1323 Slight increase in cr.  Consistent with dehydration   [JK]  1323 Anemia is stable   [JK]  1448 No further episodes of vomiting.  Patient has been treated with IV fluids and antibiotics   [JK]    Clinical Course User Index [JK] Dorie Rank, MD    Likely viral GE.  No signs to suggest obstruction. Labs are reassuring.  Sx improved with tx.  Discussed importance of getting her meds filled.  Final Clinical Impressions(s) / ED Diagnoses   Final diagnoses:  Non-intractable vomiting with nausea, unspecified vomiting type  Urinary tract infection without hematuria, site unspecified    ED Discharge Orders        Ordered    nitrofurantoin, macrocrystal-monohydrate, (MACROBID) 100 MG capsule  2 times daily     03/03/18 1450    ondansetron (ZOFRAN ODT) 8 MG disintegrating tablet  Every 8 hours PRN     03/03/18 1451       Dorie Rank, MD 03/03/18 2212

## 2018-03-04 ENCOUNTER — Inpatient Hospital Stay: Admission: RE | Admit: 2018-03-04 | Payer: Medicare Other | Source: Ambulatory Visit

## 2018-03-04 ENCOUNTER — Other Ambulatory Visit: Payer: Medicare Other

## 2018-03-05 ENCOUNTER — Other Ambulatory Visit (HOSPITAL_COMMUNITY): Payer: Self-pay | Admitting: Adult Health Nurse Practitioner

## 2018-03-05 ENCOUNTER — Telehealth (INDEPENDENT_AMBULATORY_CARE_PROVIDER_SITE_OTHER): Payer: Self-pay | Admitting: Specialist

## 2018-03-05 ENCOUNTER — Ambulatory Visit (HOSPITAL_COMMUNITY)
Admission: RE | Admit: 2018-03-05 | Discharge: 2018-03-05 | Disposition: A | Discharge status: Home / Self Care | Payer: Medicare Other | Source: Ambulatory Visit | Attending: Adult Health Nurse Practitioner | Admitting: Adult Health Nurse Practitioner

## 2018-03-05 DIAGNOSIS — R1010 Upper abdominal pain, unspecified: Secondary | ICD-10-CM | POA: Insufficient documentation

## 2018-03-05 DIAGNOSIS — R112 Nausea with vomiting, unspecified: Secondary | ICD-10-CM | POA: Insufficient documentation

## 2018-03-05 DIAGNOSIS — N390 Urinary tract infection, site not specified: Secondary | ICD-10-CM | POA: Diagnosis present

## 2018-03-05 DIAGNOSIS — K760 Fatty (change of) liver, not elsewhere classified: Secondary | ICD-10-CM | POA: Diagnosis not present

## 2018-03-05 DIAGNOSIS — Z9049 Acquired absence of other specified parts of digestive tract: Secondary | ICD-10-CM | POA: Insufficient documentation

## 2018-03-05 NOTE — Telephone Encounter (Signed)
Patient said she has had virus for past 5 days, she has not had the myelo test done - does she need to keep her appt for 6/24? Please advise # [phone]

## 2018-03-07 NOTE — Telephone Encounter (Signed)
I called and advised her to call us back once this has been r/s'd and we will get her back on our sched

## 2018-03-10 ENCOUNTER — Ambulatory Visit (INDEPENDENT_AMBULATORY_CARE_PROVIDER_SITE_OTHER): Payer: Medicare Other | Admitting: Specialist

## 2018-03-13 ENCOUNTER — Emergency Department (HOSPITAL_COMMUNITY)
Admission: EM | Admit: 2018-03-13 | Discharge: 2018-03-13 | Disposition: A | Discharge status: Home / Self Care | Payer: Medicare Other | Attending: Emergency Medicine | Admitting: Emergency Medicine

## 2018-03-13 ENCOUNTER — Other Ambulatory Visit: Payer: Self-pay

## 2018-03-13 ENCOUNTER — Encounter (HOSPITAL_COMMUNITY): Payer: Self-pay | Admitting: Emergency Medicine

## 2018-03-13 DIAGNOSIS — Z79899 Other long term (current) drug therapy: Secondary | ICD-10-CM | POA: Diagnosis not present

## 2018-03-13 DIAGNOSIS — I10 Essential (primary) hypertension: Secondary | ICD-10-CM | POA: Diagnosis not present

## 2018-03-13 DIAGNOSIS — J45909 Unspecified asthma, uncomplicated: Secondary | ICD-10-CM | POA: Insufficient documentation

## 2018-03-13 DIAGNOSIS — E039 Hypothyroidism, unspecified: Secondary | ICD-10-CM | POA: Diagnosis not present

## 2018-03-13 DIAGNOSIS — Z87891 Personal history of nicotine dependence: Secondary | ICD-10-CM | POA: Insufficient documentation

## 2018-03-13 DIAGNOSIS — R1013 Epigastric pain: Secondary | ICD-10-CM | POA: Insufficient documentation

## 2018-03-13 DIAGNOSIS — R109 Unspecified abdominal pain: Secondary | ICD-10-CM | POA: Diagnosis present

## 2018-03-13 LAB — COMPREHENSIVE METABOLIC PANEL
ALT: 32 U/L (ref 0–44)
AST: 34 U/L (ref 15–41)
Albumin: 4.1 g/dL (ref 3.5–5.0)
Alkaline Phosphatase: 71 U/L (ref 38–126)
Anion gap: 15 (ref 5–15)
BUN: 12 mg/dL (ref 6–20)
CO2: 25 mmol/L (ref 22–32)
Calcium: 9.6 mg/dL (ref 8.9–10.3)
Chloride: 98 mmol/L (ref 98–111)
Creatinine, Ser: 1.31 mg/dL — ABNORMAL HIGH (ref 0.44–1.00)
GFR calc Af Amer: 54 mL/min — ABNORMAL LOW (ref >60)
GFR calc non Af Amer: 47 mL/min — ABNORMAL LOW (ref >60)
Glucose, Bld: 100 mg/dL — ABNORMAL HIGH (ref 70–99)
Potassium: 3 mmol/L — ABNORMAL LOW (ref 3.5–5.1)
Sodium: 138 mmol/L (ref 135–145)
Total Bilirubin: 1.2 mg/dL (ref 0.3–1.2)
Total Protein: 8.2 g/dL — ABNORMAL HIGH (ref 6.5–8.1)

## 2018-03-13 LAB — CBC WITH DIFFERENTIAL/PLATELET
Basophils Absolute: 0 10*3/uL (ref 0.0–0.1)
Basophils Relative: 0 %
Eosinophils Absolute: 0.3 10*3/uL (ref 0.0–0.7)
Eosinophils Relative: 4 %
HCT: 40.6 % (ref 36.0–46.0)
Hemoglobin: 12.7 g/dL (ref 12.0–15.0)
Lymphocytes Relative: 42 %
Lymphs Abs: 3.2 10*3/uL (ref 0.7–4.0)
MCH: 26.4 pg (ref 26.0–34.0)
MCHC: 31.3 g/dL (ref 30.0–36.0)
MCV: 84.4 fL (ref 78.0–100.0)
Monocytes Absolute: 0.4 10*3/uL (ref 0.1–1.0)
Monocytes Relative: 5 %
Neutro Abs: 3.7 10*3/uL (ref 1.7–7.7)
Neutrophils Relative %: 49 %
Platelets: 334 10*3/uL (ref 150–400)
RBC: 4.81 MIL/uL (ref 3.87–5.11)
RDW: 14 % (ref 11.5–15.5)
WBC: 7.7 10*3/uL (ref 4.0–10.5)

## 2018-03-13 LAB — LIPASE, BLOOD: Lipase: 21 U/L (ref 11–51)

## 2018-03-13 MED ORDER — LACTATED RINGERS IV BOLUS
1000.0000 mL | Freq: Once | INTRAVENOUS | Status: AC
  Administered 2018-03-13: 1000 mL via INTRAVENOUS

## 2018-03-13 MED ORDER — HALOPERIDOL LACTATE 5 MG/ML IJ SOLN
2.0000 mg | Freq: Once | INTRAMUSCULAR | Status: AC
  Administered 2018-03-13: 2 mg via INTRAVENOUS
  Filled 2018-03-13: qty 1

## 2018-03-13 MED ORDER — GI COCKTAIL ~~LOC~~
30.0000 mL | Freq: Once | ORAL | Status: AC
  Administered 2018-03-13: 30 mL via ORAL
  Filled 2018-03-13: qty 30

## 2018-03-13 MED ORDER — FAMOTIDINE IN NACL 20-0.9 MG/50ML-% IV SOLN
20.0000 mg | Freq: Once | INTRAVENOUS | Status: AC
  Administered 2018-03-13: 20 mg via INTRAVENOUS
  Filled 2018-03-13: qty 50

## 2018-03-13 MED ORDER — POTASSIUM CHLORIDE CRYS ER 20 MEQ PO TBCR
60.0000 meq | EXTENDED_RELEASE_TABLET | Freq: Once | ORAL | Status: AC
  Administered 2018-03-13: 60 meq via ORAL
  Filled 2018-03-13: qty 3

## 2018-03-13 MED ORDER — HYDROMORPHONE HCL 1 MG/ML IJ SOLN
0.5000 mg | Freq: Once | INTRAMUSCULAR | Status: AC
  Administered 2018-03-13: 0.5 mg via INTRAVENOUS
  Filled 2018-03-13: qty 1

## 2018-03-13 MED ORDER — PANTOPRAZOLE SODIUM 20 MG PO TBEC: 20.0000 mg | DELAYED_RELEASE_TABLET | Freq: Two times a day (BID) | ORAL | 0 refills | Status: DC

## 2018-03-13 NOTE — ED Triage Notes (Signed)
Pt c/o epigastric pain x 2 weeks.  Has been here for same recently. Pt arrived hyperventilating. Instructed to slow deep breathe. Pt states she has been this way x 2 hours now. C/o n/v/d.

## 2018-03-17 NOTE — ED Provider Notes (Signed)
[facility] EMERGENCY DEPARTMENT Provider Note   CSN: [id number] Arrival date & time: 03/13/18  1828     History   Chief Complaint Chief Complaint  Patient presents with  . Abdominal Pain    HPI Marilyn [name] is a 47 y.o. female.  HPI   47 year old female with abdominal pain.  Epigastric.  Onset about 2 weeks ago.  Initially felt that she may have food poisoning but symptoms have persisted.  She has been evaluated in the emergency room for the same.  Symptoms wax and wane.  Consistently worse after eating though.  No sick contacts.  Associated vomiting and diarrhea.  Past Medical History:  Diagnosis Date  . Anemia    taking iron now. pt states having no current issues 09/02/2015  . Anginal pain ([place])    pt states experiences chest wall pain pt states related to her asthma   . Anxiety    with MRI's  . Arthritis   . Asthma   . Dizziness   . GERD (gastroesophageal reflux disease)   . Headache(784.0)    HX OF MIGRAINES  . History of bronchitis   . Hypertension   . Hypothyroidism    takes levothyroxen  . OSA on CPAP   . Shortness of breath    with exertion  . Wears glasses     Patient Active Problem List   Diagnosis Date Noted  . Pain of right hip joint 07/03/2017  . Acute right-sided low back pain with right-sided sciatica 03/27/2017  . Chronic right shoulder pain 02/13/2017  . History of rotator cuff tear 11/30/2016  . Impingement syndrome of right shoulder 11/30/2016  . Exacerbation of asthma 07/18/2016  . Hypokalemia 07/18/2016  . Hypertension 07/18/2016  . Depression with anxiety 07/18/2016  . Hypothyroidism 07/18/2016  . Hyperglycemia 07/18/2016  . Acute asthma exacerbation 07/18/2016  . Surgical wound dehiscence left hip; questionable superficial infection 11/10/2015  . Left hip postoperative wound infection 11/10/2015  . Osteoarthritis of left hip 09/09/2015  . 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 Obesity (BMI 35.0-39.9  without comorbidity) 04/28/2013    Past Surgical History:  Procedure Laterality Date  . CESAREAN SECTION     times 2  . CHOLECYSTECTOMY    . ENDOMETRIAL ABLATION    . INCISION AND DRAINAGE HIP Left 11/10/2015   Procedure: IRRIGATION AND DEBRIDEMENT LEFT HIP INCISION;  Surgeon: Mcarthur Rossetti, MD;  Location: [place];  Service: Orthopedics;  Laterality: Left;  . JOINT REPLACEMENT  2011   total left knee  . KNEE ARTHROPLASTY  04/23/2012   right   . KNEE ARTHROSCOPY    . ROTATOR CUFF REPAIR     left   . SHOULDER SURGERY     right to repair ligament tear  . TOTAL HIP ARTHROPLASTY Left 09/09/2015   Procedure: LEFT TOTAL HIP ARTHROPLASTY ANTERIOR APPROACH;  Surgeon: Mcarthur Rossetti, MD;  Location: [facility];  Service: Orthopedics;  Laterality: Left;  . TOTAL KNEE ARTHROPLASTY  04/23/2012   Procedure: TOTAL KNEE ARTHROPLASTY;  Surgeon: Newt Minion, MD;  Location: [place];  Service: Orthopedics;  Laterality: Right;  Right Total Knee Arthroplasty  . TUBAL LIGATION  1996     OB History    Gravida  2   Para  2   Term  2   Preterm      AB      Living  2     SAB      TAB  Ectopic      Multiple      Live Births               Home Medications    Prior to Admission medications   Medication Sig Start Date End Date Taking? Authorizing Provider  albuterol (PROVENTIL HFA;VENTOLIN HFA) 108 (90 BASE) MCG/ACT inhaler Inhale 2 puffs into the lungs every 6 (six) hours as needed for wheezing or shortness of breath.   Yes [provider]  albuterol (PROVENTIL) (2.5 MG/3ML) 0.083% nebulizer solution Take 3 mLs (2.5 mg total) by nebulization 3 (three) times daily. 07/19/16  Yes Thurnell Lose, MD  buprenorphine (SUBUTEX) 8 MG SUBL SL tablet Place 8 mg under the tongue daily.   Yes [provider]  cetirizine (ZYRTEC) 10 MG tablet Take 10 mg by mouth daily.   Yes [provider]  cyclobenzaprine (FLEXERIL) 10 MG tablet TAKE 1 TABLET BY MOUTH  THREE TIMES DAILY AS NEEDED FOR MUSCLE SPASM 05/02/17  Yes Mcarthur Rossetti, MD  diclofenac (VOLTAREN) 75 MG EC tablet Take 1 tablet (75 mg total) by mouth 2 (two) times daily. 01/29/18  Yes Jessy Oto, MD  diphenhydrAMINE (BENADRYL) 25 MG tablet Take 1 tablet (25 mg total) by mouth every 6 (six) hours. 09/29/15  Yes Jola Schmidt, MD  DULoxetine (CYMBALTA) 60 MG capsule Take 60 mg by mouth daily.  06/22/17  Yes [provider]  ergocalciferol (VITAMIN D2) 50000 units capsule Take 50,000 Units by mouth once a week.   Yes [provider]  famotidine (PEPCID) 20 MG tablet Take 20 mg by mouth 2 (two) times daily.   Yes [provider]  furosemide (LASIX) 20 MG tablet Take one tablet by mouth daily as needed for swelling 11/01/17  Yes [provider]  gabapentin (NEURONTIN) 300 MG capsule Take 1 capsule (300 mg total) by mouth 2 (two) times daily. 01/29/18  Yes Jessy Oto, MD  hydrochlorothiazide 25 MG tablet Take 25 mg by mouth daily.    Yes [provider]  levothyroxine (SYNTHROID, LEVOTHROID) 112 MCG tablet Take 112 mcg by mouth daily before breakfast.  05/31/17  Yes [provider]  NARCAN 4 MG/0.1ML LIQD nasal spray kit Place 1 spray into the nose once.  06/28/17  Yes [provider]  ondansetron (ZOFRAN ODT) 8 MG disintegrating tablet Take 1 tablet (8 mg total) by mouth every 8 (eight) hours as needed for nausea or vomiting. 03/03/18  Yes Dorie Rank, MD  oxybutynin (DITROPAN-XL) 10 MG 24 hr tablet TAKE ONE TABLET BY MOUTH ONCE DAILY 05/31/17  Yes [provider]  oxyCODONE-acetaminophen (PERCOCET/ROXICET) 5-325 MG tablet Take 1 tablet by mouth daily as needed for moderate pain.  02/06/18  Yes [provider]  promethazine (PHENERGAN) 25 MG suppository Place 1 suppository rectally every 6 (six) hours as needed. 03/05/18  Yes [provider]  RESTASIS MULTIDOSE 0.05 % ophthalmic emulsion Place 1 drop into  both eyes 2 (two) times daily.  08/12/16  Yes [provider]  Topiramate ER (TROKENDI XR) 100 MG CP24 Take 1 capsule by mouth daily.   Yes [provider]  nitrofurantoin, macrocrystal-monohydrate, (MACROBID) 100 MG capsule Take 1 capsule (100 mg total) by mouth 2 (two) times daily. X 7 days Patient not taking: Reported on 03/13/2018 03/03/18   Dorie Rank, MD  ondansetron (ZOFRAN) 4 MG tablet Take 1 tablet (4 mg total) by mouth every 8 (eight) hours as needed for nausea or vomiting. Patient not taking:  Reported on 7/32/2025 01/11/05   Delora Fuel, MD  pantoprazole (PROTONIX) 20 MG tablet Take 1 tablet (20 mg total) by mouth 2 (two) times daily before a meal. 03/13/18   Virgel Manifold, MD    Family History Family History  Problem Relation Age of Onset  . Cancer Mother        colon  . Epilepsy Mother   . Cancer Father        prostate  . Diabetes Father   . Hypertension Father   . Hypertension Maternal Aunt   . Diabetes Maternal Aunt   . Hypertension Paternal Aunt     Social History Social History   Tobacco Use  . Smoking status: Former Smoker    Packs/day: 0.50    Years: 4.00    Pack years: 2.00    Last attempt to quit: 09/18/1991    Years since quitting: 26.5  . Smokeless tobacco: Never Used  Substance Use Topics  . Alcohol use: No  . Drug use: No     Allergies   Lisinopril; Bee venom; Other; Propoxyphene; Meloxicam; Codeine; Latex; Morphine and related; and Tomato   Review of Systems Review of Systems  All systems reviewed and negative, other than as noted in HPI.  Physical Exam Updated Vital Signs BP (!) 154/83   Pulse 67   Temp 99.1 F (37.3 C) (Oral)   Resp (!) 25   SpO2 99%   Physical Exam  Constitutional: She appears well-developed and well-nourished. No distress.  HENT:  Head: Normocephalic and atraumatic.  Eyes: Conjunctivae are normal. Right eye exhibits no discharge. Left eye exhibits no discharge.  Neck: Neck supple.    Cardiovascular: Normal rate, regular rhythm and normal heart sounds. Exam reveals no gallop and no friction rub.  No murmur heard. Pulmonary/Chest: Effort normal and breath sounds normal. No respiratory distress.  Abdominal: Soft. She exhibits no distension. There is tenderness.  Epigastric tenderness without rebound or guarding.  Musculoskeletal: She exhibits no edema or tenderness.  Neurological: She is alert.  Skin: Skin is warm and dry.  Psychiatric: She has a normal mood and affect. Her behavior is normal. Thought content normal.  Nursing note and vitals reviewed.    ED Treatments / Results  Labs (all labs ordered are listed, but only abnormal results are displayed) Labs Reviewed  COMPREHENSIVE METABOLIC PANEL - Abnormal; Notable for the following components:      Result Value   Potassium 3.0 (*)    Glucose, Bld 100 (*)    Creatinine, Ser 1.31 (*)    Total Protein 8.2 (*)    GFR calc non Af Amer 47 (*)    GFR calc Af Amer 54 (*)    All other components within normal limits  CBC WITH DIFFERENTIAL/PLATELET  LIPASE, BLOOD    EKG None  Radiology No results found.  Procedures Procedures (including critical care time)  Medications Ordered in ED Medications  haloperidol lactate (HALDOL) injection 2 mg (2 mg Intravenous Given 03/13/18 1948)  gi cocktail (Maalox,Lidocaine,Donnatal) (30 mLs Oral Given 03/13/18 1951)  HYDROmorphone (DILAUDID) injection 0.5 mg (0.5 mg Intravenous Given 03/13/18 1946)  lactated ringers bolus 1,000 mL (0 mLs Intravenous Stopped 03/13/18 2108)  famotidine (PEPCID) IVPB 20 mg premix (0 mg Intravenous Stopped 03/13/18 2034)  potassium chloride SA (K-DUR,KLOR-CON) CR tablet 60 mEq (60 mEq Oral Given 03/13/18 2109)     Initial Impression / Assessment and Plan / ED Course  I have reviewed the triage vital signs and the nursing notes.  Pertinent labs & imaging results that were available during my care of the patient were reviewed by me and  considered in my medical decision making (see chart for details).     47 year old female with epigastric pain and nausea/vomiting.  Ongoing for 2 weeks.  Consistently worse after eating.  She is status post cholecystectomy.  We will empirically put her on a PPI for possible PUD.  Symptoms going on for 2 weeks at this point I think that GI follow-up is reasonable.  Return precautions were discussed.  Final Clinical Impressions(s) / ED Diagnoses   Final diagnoses:  Epigastric pain    ED Discharge Orders        Ordered    pantoprazole (PROTONIX) 20 MG tablet  2 times daily before meals     03/13/18 2240      Virgel Manifold, MD 03/17/18 1742

## 2018-03-24 ENCOUNTER — Other Ambulatory Visit: Payer: Medicare Other

## 2018-03-31 ENCOUNTER — Other Ambulatory Visit: Payer: Medicare Other

## 2018-04-03 ENCOUNTER — Ambulatory Visit (INDEPENDENT_AMBULATORY_CARE_PROVIDER_SITE_OTHER): Payer: Medicare Other | Admitting: Specialist

## 2018-04-07 ENCOUNTER — Ambulatory Visit
Admission: RE | Admit: 2018-04-07 | Discharge: 2018-04-07 | Disposition: A | Discharge status: Home / Self Care | Payer: Medicare Other | Source: Ambulatory Visit | Attending: Specialist | Admitting: Specialist

## 2018-04-07 DIAGNOSIS — M4316 Spondylolisthesis, lumbar region: Secondary | ICD-10-CM

## 2018-04-07 DIAGNOSIS — M5136 Other intervertebral disc degeneration, lumbar region: Secondary | ICD-10-CM

## 2018-04-07 DIAGNOSIS — M48062 Spinal stenosis, lumbar region with neurogenic claudication: Secondary | ICD-10-CM

## 2018-04-07 DIAGNOSIS — M4317 Spondylolisthesis, lumbosacral region: Secondary | ICD-10-CM

## 2018-04-07 MED ORDER — DIAZEPAM 5 MG PO TABS
10.0000 mg | ORAL_TABLET | Freq: Once | ORAL | Status: AC
  Administered 2018-04-07: 10 mg via ORAL

## 2018-04-07 MED ORDER — IOPAMIDOL (ISOVUE-M 200) INJECTION 41%
20.0000 mL | Freq: Once | INTRAMUSCULAR | Status: AC
  Administered 2018-04-07: 20 mL via INTRATHECAL

## 2018-04-07 NOTE — Discharge Instructions (Signed)
Myelogram Discharge Instructions  1. Go home and rest quietly for the next 24 hours.  It is important to lie flat for the next 24 hours.  Get up only to go to the restroom.  You may lie in the bed or on a couch on your back, your stomach, your left side or your right side.  You may have one pillow under your head.  You may have pillows between your knees while you are on your side or under your knees while you are on your back.  2. DO NOT drive today.  Recline the seat as far back as it will go, while still wearing your seat belt, on the way home.  3. You may get up to go to the bathroom as needed.  You may sit up for 10 minutes to eat.  You may resume your normal diet and medications unless otherwise indicated.  Drink lots of extra fluids today and tomorrow.  4. The incidence of headache, nausea, or vomiting is about 5% (one in 20 patients).  If you develop a headache, lie flat and drink plenty of fluids until the headache goes away.  Caffeinated beverages may be helpful.  If you develop severe nausea and vomiting or a headache that does not go away with flat bed rest, call [phone].  5. You may resume normal activities after your 24 hours of bed rest is over; however, do not exert yourself strongly or do any heavy lifting tomorrow. If when you get up you have a headache when standing, go back to bed and force fluids for another 24 hours.  6. Call your physician for a follow-up appointment.  The results of your myelogram will be sent directly to your physician by the following day.  7. If you have any questions or if complications develop after you arrive home, please call [phone].  Discharge instructions have been explained to the patient.  The patient, or the person responsible for the patient, fully understands these instructions.  YOU MAY RESTART YOUR CYMBALTA TOMORROW 04/08/2018 AT 10:30AM.

## 2018-04-10 ENCOUNTER — Encounter (INDEPENDENT_AMBULATORY_CARE_PROVIDER_SITE_OTHER): Payer: Self-pay | Admitting: Specialist

## 2018-04-10 ENCOUNTER — Ambulatory Visit (INDEPENDENT_AMBULATORY_CARE_PROVIDER_SITE_OTHER): Payer: Medicare Other | Admitting: Specialist

## 2018-04-10 VITALS — BP 153/85 | HR 69 | Ht 63.0 in | Wt 292.0 lb

## 2018-04-10 DIAGNOSIS — M4316 Spondylolisthesis, lumbar region: Secondary | ICD-10-CM

## 2018-04-10 DIAGNOSIS — M48062 Spinal stenosis, lumbar region with neurogenic claudication: Secondary | ICD-10-CM | POA: Diagnosis not present

## 2018-04-10 DIAGNOSIS — M5136 Other intervertebral disc degeneration, lumbar region: Secondary | ICD-10-CM

## 2018-04-10 NOTE — Progress Notes (Signed)
Office Visit Note   Patient: Marilyn [name]           Date of Birth: 08-08-1971           MRN: [id number] Visit Date: 04/10/2018              Requested by: Joette Catching, MD [address] [place], [place] [address] PCP: Joette Catching, MD   Assessment & Plan: Visit Diagnoses:  1. Degenerative disc disease, lumbar   2. Spinal stenosis of lumbar region with neurogenic claudication   3. Spondylolisthesis, lumbar region    Body mass index is 51.73 kg/m.  Plan: Avoid bending, stooping and avoid lifting weights greater than 10 lbs. Avoid prolong standing and walking. Order for a new walker with wheels. Surgery scheduling secretary Tivis Ringer, will call you in the next week to schedule for surgery.  Surgery recommended is a two level lumbar fusion L5-S1 and L4-5 this would be done with rods, screws and cages with local bone graft and allograft (donor bone graft). Take hydrocodone for for pain. Risk of surgery includes risk of infection 1 in 200 patients, bleeding 1/2% chance you would need a transfusion.   Risk to the nerves is one in 10,000. You will need to use a brace for 3 months and wean from the brace on the 4th month. Expect improved walking and standing tolerance. Expect relief of leg pain but numbness may persist depending on the length and degree of pressure that has been present.    Follow-Up Instructions: Return in about 1 month (around 05/08/2018).   Orders:  No orders of the defined types were placed in this encounter.  No orders of the defined types were placed in this encounter.     Procedures: No procedures performed   Clinical Data: No additional findings.   Subjective: Chief Complaint  Patient presents with  . Lower Back - Follow-up    Ct Myelogram Review    HPI  Review of Systems  Constitutional: Negative.   HENT: Negative.   Eyes: Negative.   Respiratory: Negative.   Cardiovascular: Negative.   Gastrointestinal: Negative.     Endocrine: Negative.   Genitourinary: Negative.   Musculoskeletal: Negative.   Skin: Negative.   Allergic/Immunologic: Negative.   Neurological: Negative.   Hematological: Negative.   Psychiatric/Behavioral: Negative.      Objective: Vital Signs: BP (!) 153/85 (BP Location: Left Arm, Patient Position: Sitting)   Pulse 69   Ht 5\' 3"  (1.6 m)   Wt 292 lb (132.5 kg)   BMI 51.73 kg/m   Physical Exam  Constitutional: She is oriented to person, place, and time. She appears well-developed and well-nourished.  HENT:  Head: Normocephalic and atraumatic.  Eyes: Pupils are equal, round, and reactive to light. EOM are normal.  Neck: Normal range of motion. Neck supple.  Pulmonary/Chest: Effort normal and breath sounds normal.  Abdominal: Soft. Bowel sounds are normal.  Musculoskeletal: Normal range of motion.  Neurological: She is alert and oriented to person, place, and time.  Skin: Skin is warm and dry.  Psychiatric: She has a normal mood and affect. Her behavior is normal. Judgment and thought content normal.    Ortho Exam  Specialty Comments:  No specialty comments available.  Imaging: No results found.   PMFS History: Patient Active Problem List   Diagnosis Date Noted  . Pain of right hip joint 07/03/2017  . Acute right-sided low back pain with right-sided sciatica 03/27/2017  . Chronic right shoulder pain 02/13/2017  .  History of rotator cuff tear 11/30/2016  . Impingement syndrome of right shoulder 11/30/2016  . Exacerbation of asthma 07/18/2016  . Hypokalemia 07/18/2016  . Hypertension 07/18/2016  . Depression with anxiety 07/18/2016  . Hypothyroidism 07/18/2016  . Hyperglycemia 07/18/2016  . Acute asthma exacerbation 07/18/2016  . Surgical wound dehiscence left hip; questionable superficial infection 11/10/2015  . Left hip postoperative wound infection 11/10/2015  . Osteoarthritis of left hip 09/09/2015  . Status post total replacement of left hip 09/09/2015   . Obesity (BMI 35.0-39.9 without comorbidity) 04/28/2013   Past Medical History:  Diagnosis Date  . Anemia    taking iron now. pt states having no current issues 09/02/2015  . Anginal pain ([facility])    pt states experiences chest wall pain pt states related to her asthma   . Anxiety    with MRI's  . Arthritis   . Asthma   . Dizziness   . GERD (gastroesophageal reflux disease)   . Headache(784.0)    HX OF MIGRAINES  . History of bronchitis   . Hypertension   . Hypothyroidism    takes levothyroxen  . OSA on CPAP   . Shortness of breath    with exertion  . Wears glasses     Family History  Problem Relation Age of Onset  . Cancer Mother        colon  . Epilepsy Mother   . Cancer Father        prostate  . Diabetes Father   . Hypertension Father   . Hypertension Maternal Aunt   . Diabetes Maternal Aunt   . Hypertension Paternal Aunt     Past Surgical History:  Procedure Laterality Date  . CESAREAN SECTION     times 2  . CHOLECYSTECTOMY    . ENDOMETRIAL ABLATION    . INCISION AND DRAINAGE HIP Left 11/10/2015   Procedure: IRRIGATION AND DEBRIDEMENT LEFT HIP INCISION;  Surgeon: Kathryne Hitchhristopher Y Blackman, MD;  Location: [facility];  Service: Orthopedics;  Laterality: Left;  . JOINT REPLACEMENT  2011   total left knee  . KNEE ARTHROPLASTY  04/23/2012   right   . KNEE ARTHROSCOPY    . ROTATOR CUFF REPAIR     left   . SHOULDER SURGERY     right to repair ligament tear  . TOTAL HIP ARTHROPLASTY Left 09/09/2015   Procedure: LEFT TOTAL HIP ARTHROPLASTY ANTERIOR APPROACH;  Surgeon: Kathryne Hitchhristopher Y Blackman, MD;  Location: [facility];  Service: Orthopedics;  Laterality: Left;  . TOTAL KNEE ARTHROPLASTY  04/23/2012   Procedure: TOTAL KNEE ARTHROPLASTY;  Surgeon: Nadara MustardMarcus V Duda, MD;  Location: [facility];  Service: Orthopedics;  Laterality: Right;  Right Total Knee Arthroplasty  . TUBAL LIGATION  1996   Social History   Occupational History  . Not on file  Tobacco Use  . Smoking status: Former  Smoker    Packs/day: 0.50    Years: 4.00    Pack years: 2.00    Last attempt to quit: 09/18/1991    Years since quitting: 26.5  . Smokeless tobacco: Never Used  Substance and Sexual Activity  . Alcohol use: No  . Drug use: No  . Sexual activity: Yes    Birth control/protection: Surgical

## 2018-04-10 NOTE — Patient Instructions (Addendum)
Plan: Avoid bending, stooping and avoid lifting weights greater than 10 lbs. Avoid prolong standing and walking. Order for a new walker with wheels. Surgery scheduling secretary Tivis RingerSherri Billings, will call you in the next week to schedule for surgery.  Surgery recommended is a two level lumbar fusion L5-S1 and L4-5 this would be done with rods, screws and cages with local bone graft and allograft (donor bone graft). Take hydrocodone for for pain. Risk of surgery includes risk of infection 1 in 200 patients, bleeding 1/2% chance you would need a transfusion.   Risk to the nerves is one in 10,000. You will need to use a brace for 3 months and wean from the brace on the 4th month. Expect improved walking and standing tolerance. Expect relief of leg pain but numbness may persist depending on the length and degree of pressure that has been present.

## 2018-04-29 ENCOUNTER — Telehealth (INDEPENDENT_AMBULATORY_CARE_PROVIDER_SITE_OTHER): Payer: Self-pay | Admitting: Specialist

## 2018-04-29 NOTE — Telephone Encounter (Signed)
Patient called requesting to speak with you in regards to her surgery. No moe info given.  [phone]

## 2018-04-29 NOTE — Telephone Encounter (Signed)
I called and spoke with patient, she states that she is still having issues with her stomach with vomitting and she is wanting to know if she needs to postpone her surgery?

## 2018-05-01 ENCOUNTER — Telehealth (INDEPENDENT_AMBULATORY_CARE_PROVIDER_SITE_OTHER): Payer: Self-pay | Admitting: Specialist

## 2018-05-01 NOTE — Telephone Encounter (Signed)
Patient called wanting to speak with you in regards to her surgery on the 27th.  CB#[phone].  Thank you

## 2018-05-01 NOTE — Telephone Encounter (Signed)
I called and advised pt that we need to cancel her surgery till her GI issue has been taken care of, she will call our office back once she has this taken care of for another appt to review all info

## 2018-05-06 ENCOUNTER — Inpatient Hospital Stay (HOSPITAL_COMMUNITY)
Admission: RE | Admit: 2018-05-06 | Discharge: 2018-05-06 | Disposition: A | Discharge status: Home / Self Care | Payer: Medicare Other | Source: Ambulatory Visit

## 2018-05-06 ENCOUNTER — Other Ambulatory Visit (HOSPITAL_COMMUNITY): Payer: Self-pay | Admitting: Adult Health Nurse Practitioner

## 2018-05-06 DIAGNOSIS — Z1231 Encounter for screening mammogram for malignant neoplasm of breast: Secondary | ICD-10-CM

## 2018-05-08 ENCOUNTER — Ambulatory Visit (INDEPENDENT_AMBULATORY_CARE_PROVIDER_SITE_OTHER): Payer: Medicare Other | Admitting: Surgery

## 2018-05-08 ENCOUNTER — Ambulatory Visit (HOSPITAL_COMMUNITY)
Admission: RE | Admit: 2018-05-08 | Discharge: 2018-05-08 | Disposition: A | Discharge status: Home / Self Care | Payer: Medicare Other | Source: Ambulatory Visit | Attending: Adult Health Nurse Practitioner | Admitting: Adult Health Nurse Practitioner

## 2018-05-08 ENCOUNTER — Encounter (HOSPITAL_COMMUNITY): Payer: Self-pay

## 2018-05-08 DIAGNOSIS — Z1231 Encounter for screening mammogram for malignant neoplasm of breast: Secondary | ICD-10-CM | POA: Diagnosis present

## 2018-05-13 ENCOUNTER — Encounter (HOSPITAL_COMMUNITY): Payer: Self-pay

## 2018-05-13 ENCOUNTER — Inpatient Hospital Stay (HOSPITAL_COMMUNITY): Admit: 2018-05-13 | Payer: Medicare Other | Admitting: Specialist

## 2018-05-13 SURGERY — POSTERIOR LUMBAR FUSION 2 LEVEL
Anesthesia: General

## 2018-05-21 ENCOUNTER — Ambulatory Visit (INDEPENDENT_AMBULATORY_CARE_PROVIDER_SITE_OTHER): Payer: Medicare Other | Admitting: Surgery

## 2018-05-21 ENCOUNTER — Telehealth (INDEPENDENT_AMBULATORY_CARE_PROVIDER_SITE_OTHER): Payer: Self-pay | Admitting: Surgery

## 2018-05-21 ENCOUNTER — Encounter (INDEPENDENT_AMBULATORY_CARE_PROVIDER_SITE_OTHER): Payer: Self-pay | Admitting: Surgery

## 2018-05-21 VITALS — BP 118/68 | HR 69 | Temp 98.5°F

## 2018-05-21 DIAGNOSIS — M4317 Spondylolisthesis, lumbosacral region: Secondary | ICD-10-CM

## 2018-05-21 DIAGNOSIS — M48062 Spinal stenosis, lumbar region with neurogenic claudication: Secondary | ICD-10-CM

## 2018-05-21 NOTE — H&P (Addendum)
Marilyn [name] is an 47 y.o. female.   Chief Complaint: Back pain and lower extremity radiculopathy HPI: Patient with history of L4-5 L5-S1/stenosis and the above complaint presents for preoperative evaluation today.  Progressively worsening symptoms.  Failed conservative treatment.  Past Medical History:  Diagnosis Date  . Anemia    taking iron now. pt states having no current issues 09/02/2015  . Anginal pain ([facility])    pt states experiences chest wall pain pt states related to her asthma   . Anxiety    with MRI's  . Arthritis   . Asthma   . Dizziness   . GERD (gastroesophageal reflux disease)   . Headache(784.0)    HX OF MIGRAINES  . History of bronchitis   . Hypertension   . Hypothyroidism    takes levothyroxen  . OSA on CPAP   . Shortness of breath    with exertion  . Wears glasses     Past Surgical History:  Procedure Laterality Date  . CESAREAN SECTION     times 2  . CHOLECYSTECTOMY    . ENDOMETRIAL ABLATION    . INCISION AND DRAINAGE HIP Left 11/10/2015   Procedure: IRRIGATION AND DEBRIDEMENT LEFT HIP INCISION;  Surgeon: Kathryne Hitch, MD;  Location: [facility];  Service: Orthopedics;  Laterality: Left;  . JOINT REPLACEMENT  2011   total left knee  . KNEE ARTHROPLASTY  04/23/2012   right   . KNEE ARTHROSCOPY    . ROTATOR CUFF REPAIR     left   . SHOULDER SURGERY     right to repair ligament tear  . TOTAL HIP ARTHROPLASTY Left 09/09/2015   Procedure: LEFT TOTAL HIP ARTHROPLASTY ANTERIOR APPROACH;  Surgeon: Kathryne Hitch, MD;  Location: [facility];  Service: Orthopedics;  Laterality: Left;  . TOTAL KNEE ARTHROPLASTY  04/23/2012   Procedure: TOTAL KNEE ARTHROPLASTY;  Surgeon: [name], MD;  Location: [facility];  Service: Orthopedics;  Laterality: Right;  Right Total Knee Arthroplasty  . TUBAL LIGATION  1996    Family History  Problem Relation Age of Onset  . Cancer Mother        colon  . Epilepsy Mother   . Cancer Father        prostate  .  Diabetes Father   . Hypertension Father   . Hypertension Maternal Aunt   . Diabetes Maternal Aunt   . Hypertension Paternal Aunt    Social History:  reports that she quit smoking about 26 years ago. She has a 2.00 pack-year smoking history. She has never used smokeless tobacco. She reports that she does not drink alcohol or use drugs.  Allergies:  Allergies  Allergen Reactions  . Lisinopril Anaphylaxis  . Bee Venom Swelling and Other (See Comments)    On site swelling  . Propoxyphene Hives  . Meloxicam Diarrhea and Other (See Comments)    Insomnia, constipation  . Codeine Nausea Only  . Latex Rash  . Morphine And Related Rash  . Tomato Rash    No medications prior to admission.    No results found for this or any previous visit (from the past 48 hour(s)). No results found.  Review of Systems  Constitutional: Negative.   HENT: Negative.   Respiratory: Negative.   Cardiovascular: Negative.   Gastrointestinal: Negative.   Genitourinary: Negative.   Musculoskeletal: Positive for back pain.  Skin: Negative.   Neurological: Positive for tingling.    There were no vitals taken for this visit. Physical  Exam  Constitutional: She is oriented to person, place, and time. No distress.  HENT:  Head: Normocephalic and atraumatic.  Eyes: Pupils are equal, round, and reactive to light. EOM are normal.  Neck: Normal range of motion.  Cardiovascular: Normal rate.  Respiratory: Effort normal. No respiratory distress.  GI: Soft. She exhibits no distension. There is no tenderness.  Musculoskeletal: She exhibits tenderness.  Neurological: She is alert and oriented to person, place, and time.  Skin: Skin is warm and dry.  Psychiatric: She has a normal mood and affect.     Assessment/Plan L4-5 and L5-S1 HNP/stenosis, back pain and lower extremity radiculopathy   We will proceed with TRANSFORAMINAL LUMBAR INTERBODY FUSION LEFT L4-5 AND L5-S1 WITH DEPUY MPACT SCREWS AND RODS,  CONCORDE PROTI CAGES, LOCAL AND ALLOGRAFT BONE GRAFT, VIVIGEN as scheduled.  Surgery procedure along with possible rehab/recovery time discussed.  All questions answered and wishes to proceed.  [name], PA-C 05/21/2018, 11:27 AM

## 2018-05-21 NOTE — Telephone Encounter (Signed)
Patient called advised she spoke with pain management in [place] today and was advised that it was ok for Dr Otelia Sergeant to control her pain medicine while she is under Dr Otelia Sergeant care after surgery. The number to contact patient is [phone]

## 2018-05-21 NOTE — Progress Notes (Signed)
47 year old female history of L4-5 and L5-S1 spinal stenosis presents for preoperative evaluation.  Continues to have ongoing symptoms.  Today full history and physical performed.  We received preop cardiac clearance.

## 2018-05-22 NOTE — Telephone Encounter (Signed)
Patient called advised she spoke with pain management in [place] today and was advised that it was ok for Dr Nitka to control her pain medicine while she is under Dr Nitka care after surgery. The number to contact patient is [phone] °

## 2018-05-22 NOTE — Pre-Procedure Instructions (Signed)
Marilyn [name]  05/22/2018      [facility] [address], [place] - [address] [place] [address] Phone: [phone] Fax: [phone]    Your procedure is scheduled on Tuesday Septmeber 10th.  Report to [facility] Admitting at 0530 A.M.  Call this number if you have problems the morning of surgery:  [phone]   Remember:  Do not eat or drink after midnight.     Take these medicines the morning of surgery with A SIP OF WATER   Albuterol if needed (Bring inhaler with you)  Zyrtec (if needed)  Flexeril (if needed)  Synthroid  Oxybutynin  Oxycodone (if needed)  Reglan (if needed)   Stop taking your Subutex 3 days prior to surgery and call the prescribing doctor to inform them.  7 days prior to surgery STOP taking any Aspirin(unless otherwise instructed by your surgeon), Aleve, Naproxen, Ibuprofen, Motrin, Advil, Goody's, BC's, all herbal medications, fish oil, and all vitamins       Do not wear jewelry, make-up or nail polish.  Do not wear lotions, powders, or perfumes, or deodorant.  Do not shave 48 hours prior to surgery.  Men may shave face and neck.  Do not bring valuables to the hospital.  [facility] is not responsible for any belongings or valuables.  Contacts, dentures or bridgework may not be worn into surgery.  Leave your suitcase in the car.  After surgery it may be brought to your room.  For patients admitted to the hospital, discharge time will be determined by your treatment team.  Patients discharged the day of surgery will not be allowed to drive home.    [facility]- Preparing For Surgery  Before surgery, you can play an important role. Because skin is not sterile, your skin needs to be as free of germs as possible. You can reduce the number of germs on your skin by washing with CHG (chlorahexidine gluconate) Soap before surgery.  CHG is an antiseptic cleaner which kills germs and bonds with the skin to  continue killing germs even after washing.    Oral Hygiene is also important to reduce your risk of infection.  Remember - BRUSH YOUR TEETH THE MORNING OF SURGERY WITH YOUR REGULAR TOOTHPASTE  Please do not use if you have an allergy to CHG or antibacterial soaps. If your skin becomes reddened/irritated stop using the CHG.  Do not shave (including legs and underarms) for at least 48 hours prior to first CHG shower. It is OK to shave your face.  Please follow these instructions carefully.   1. Shower the NIGHT BEFORE SURGERY and the MORNING OF SURGERY with CHG.   2. If you chose to wash your hair, wash your hair first as usual with your normal shampoo.  3. After you shampoo, rinse your hair and body thoroughly to remove the shampoo.  4. Use CHG as you would any other liquid soap. You can apply CHG directly to the skin and wash gently with a scrungie or a clean washcloth.   5. Apply the CHG Soap to your body ONLY FROM THE NECK DOWN.  Do not use on open wounds or open sores. Avoid contact with your eyes, ears, mouth and genitals (private parts). Wash Face and genitals (private parts)  with your normal soap.  6. Wash thoroughly, paying special attention to the area where your surgery will be performed.  7. Thoroughly rinse your body with warm water  from the neck down.  8. DO NOT shower/wash with your normal soap after using and rinsing off the CHG Soap.  9. Pat yourself dry with a CLEAN TOWEL.  10. Wear CLEAN PAJAMAS to bed the night before surgery, wear comfortable clothes the morning of surgery  11. Place CLEAN SHEETS on your bed the night of your first shower and DO NOT SLEEP WITH PETS.    Day of Surgery:  Do not apply any deodorants/lotions.  Please wear clean clothes to the hospital/surgery center.   Remember to brush your teeth WITH YOUR REGULAR TOOTHPASTE.    Please read over the following fact sheets that you were given.

## 2018-05-23 ENCOUNTER — Encounter (HOSPITAL_COMMUNITY)
Admission: RE | Admit: 2018-05-23 | Discharge: 2018-05-23 | Disposition: A | Discharge status: Home / Self Care | Payer: Medicare Other | Source: Ambulatory Visit | Attending: Surgery | Admitting: Surgery

## 2018-05-23 ENCOUNTER — Encounter (HOSPITAL_COMMUNITY): Payer: Self-pay

## 2018-05-23 ENCOUNTER — Encounter (HOSPITAL_COMMUNITY)
Admission: RE | Admit: 2018-05-23 | Discharge: 2018-05-23 | Disposition: A | Discharge status: Home / Self Care | Payer: Medicare Other | Source: Ambulatory Visit | Attending: Specialist | Admitting: Specialist

## 2018-05-23 ENCOUNTER — Other Ambulatory Visit: Payer: Self-pay

## 2018-05-23 DIAGNOSIS — Z01812 Encounter for preprocedural laboratory examination: Secondary | ICD-10-CM | POA: Diagnosis present

## 2018-05-23 DIAGNOSIS — Z01810 Encounter for preprocedural cardiovascular examination: Secondary | ICD-10-CM | POA: Insufficient documentation

## 2018-05-23 DIAGNOSIS — Z01818 Encounter for other preprocedural examination: Secondary | ICD-10-CM

## 2018-05-23 LAB — COMPREHENSIVE METABOLIC PANEL
ALT: 28 U/L (ref 0–44)
AST: 32 U/L (ref 15–41)
Albumin: 3.6 g/dL (ref 3.5–5.0)
Alkaline Phosphatase: 69 U/L (ref 38–126)
Anion gap: 13 (ref 5–15)
BUN: 5 mg/dL — AB (ref 6–20)
CHLORIDE: 98 mmol/L (ref 98–111)
CO2: 27 mmol/L (ref 22–32)
CREATININE: 1.04 mg/dL — AB (ref 0.44–1.00)
Calcium: 9.3 mg/dL (ref 8.9–10.3)
GFR calc Af Amer: >60 mL/min (ref >60)
Glucose, Bld: 106 mg/dL — ABNORMAL HIGH (ref 70–99)
Potassium: 3.2 mmol/L — ABNORMAL LOW (ref 3.5–5.1)
Sodium: 138 mmol/L (ref 135–145)
Total Bilirubin: 1 mg/dL (ref 0.3–1.2)
Total Protein: 7.8 g/dL (ref 6.5–8.1)

## 2018-05-23 LAB — URINALYSIS, ROUTINE W REFLEX MICROSCOPIC
BILIRUBIN URINE: NEGATIVE
GLUCOSE, UA: NEGATIVE mg/dL
HGB URINE DIPSTICK: NEGATIVE
Ketones, ur: NEGATIVE mg/dL
NITRITE: POSITIVE — AB
PH: 7 (ref 5.0–8.0)
Protein, ur: NEGATIVE mg/dL
SPECIFIC GRAVITY, URINE: 1.016 (ref 1.005–1.030)

## 2018-05-23 LAB — APTT: aPTT: 37 seconds — ABNORMAL HIGH (ref 24–36)

## 2018-05-23 LAB — CBC
HEMATOCRIT: 38.9 % (ref 36.0–46.0)
Hemoglobin: 11.6 g/dL — ABNORMAL LOW (ref 12.0–15.0)
MCH: 25.9 pg — ABNORMAL LOW (ref 26.0–34.0)
MCHC: 29.8 g/dL — ABNORMAL LOW (ref 30.0–36.0)
MCV: 86.8 fL (ref 78.0–100.0)
PLATELETS: 308 10*3/uL (ref 150–400)
RBC: 4.48 MIL/uL (ref 3.87–5.11)
RDW: 14.3 % (ref 11.5–15.5)
WBC: 6 10*3/uL (ref 4.0–10.5)

## 2018-05-23 LAB — TYPE AND SCREEN
ABO/RH(D): O POS
Antibody Screen: NEGATIVE

## 2018-05-23 LAB — SURGICAL PCR SCREEN
MRSA, PCR: NEGATIVE
Staphylococcus aureus: NEGATIVE

## 2018-05-23 LAB — PROTIME-INR
INR: 1.03
PROTHROMBIN TIME: 13.4 s (ref 11.4–15.2)

## 2018-05-23 NOTE — Pre-Procedure Instructions (Signed)
Abnormal urinalysis in PAT. Inbox message sent to Dr. Lovell Sheehan.

## 2018-05-23 NOTE — Progress Notes (Signed)
PCP - Dr. Lysbeth Galas  Cardiologist -  Dr. Leeann Must  Chest x-ray - 05/23/18 EKG - 05/23/18 ECHO - 2019  Sleep Study -2014  CPAP -  Bring mask and hose  Blood Thinner Instructions: N/A Aspirin Instructions: N/A  Anesthesia review: yes. Requested outside documents  Patient denies shortness of breath, fever, cough and chest pain at PAT appointment   Patient verbalized understanding of instructions that were given to them at the PAT appointment. Patient was also instructed that they will need to review over the PAT instructions again at home before surgery.

## 2018-05-26 ENCOUNTER — Telehealth (INDEPENDENT_AMBULATORY_CARE_PROVIDER_SITE_OTHER): Payer: Self-pay | Admitting: Specialist

## 2018-05-26 ENCOUNTER — Inpatient Hospital Stay (INDEPENDENT_AMBULATORY_CARE_PROVIDER_SITE_OTHER): Payer: Medicare Other | Admitting: Specialist

## 2018-05-26 MED ORDER — AMOXICILLIN-POT CLAVULANATE 500-125 MG PO TABS: 1.0000 | ORAL_TABLET | Freq: Three times a day (TID) | ORAL | 0 refills | Status: DC

## 2018-05-26 MED ORDER — DEXTROSE 5 % IV SOLN
3.0000 g | INTRAVENOUS | Status: AC
  Administered 2018-05-27 (×3): 3 g via INTRAVENOUS
  Filled 2018-05-26: qty 3

## 2018-05-26 NOTE — Telephone Encounter (Signed)
Fayrene Fearing w/anesthesia at [facility] Short Stay [phone] calling to say that patient is scheduled for surgery tomorrow and has a urinalysis that has come back dirty.  See labs from 05-23-18

## 2018-05-26 NOTE — Progress Notes (Signed)
Anesthesia Chart Review:  Case:  [id number] Date/Time:  05/27/18 0715   Procedure:  TRANSFORAMINAL LUMBAR INTERBODY FUSION LEFT L4-5 AND L5-S1 WITH DEPUY MPACT SCREWS AND RODS, CONCORDE PROTI CAGES, LOCAL AND ALLOGRAFT BONE GRAFT, VIVIGEN (N/A )   Anesthesia type:  General   Pre-op diagnosis:  lumbar spondylolisthesis and severe bilateral foraminal stenosis L4-5 and L5-S1   Location:  [facility] OR ROOM 06 / [facility]   Surgeon:  Jessy Oto, MD      DISCUSSION: 47 yo female former smoker for above procedure. Pertinent hx includes HTN, Hypothyroid, HA, Persistent nausea/vomiting, Chest pain (pt states experiences chest wall pain related to her asthma), Anxiety, Anemia, OSA on CPAP, GERD.  Pt was seen by cardiology 02/07/2018 for eval of bilateral LE edema. Per notes in care everywhere her edema was felt to be more due to morbid obesity and venous insufficiency. Echo was ordered which showed EF 60-65% with normal wall motion and no gross valvular abnormality.  Anticipate she can proceed with surgery as planned barring acute status change.  VS: BP (!) 158/81   Pulse 72   Temp 36.9 C (Oral)   Ht '5\' 3"'  (1.6 m)   Wt 131.3 kg   SpO2 100%   BMI 51.28 kg/m   PROVIDERS: [name], MD is PCP  Lars Mage, NP is Cardiology provider last seen 02/07/2018  LABS: Labs reviewed: Acceptable for surgery. Dr. Otho Ket office has been notified of UA results. (all labs ordered are listed, but only abnormal results are displayed)  Labs Reviewed  APTT - Abnormal; Notable for the following components:      Result Value   aPTT 37 (*)    All other components within normal limits  CBC - Abnormal; Notable for the following components:   Hemoglobin 11.6 (*)    MCH 25.9 (*)    MCHC 29.8 (*)    All other components within normal limits  COMPREHENSIVE METABOLIC PANEL - Abnormal; Notable for the following components:   Potassium 3.2 (*)    Glucose, Bld 106 (*)    BUN 5 (*)    Creatinine, Ser 1.04 (*)    All other components within normal limits  URINALYSIS, ROUTINE W REFLEX MICROSCOPIC - Abnormal; Notable for the following components:   APPearance HAZY (*)    Nitrite POSITIVE (*)    Leukocytes, UA SMALL (*)    Bacteria, UA MANY (*)    All other components within normal limits  SURGICAL PCR SCREEN  PROTIME-INR  TYPE AND SCREEN    IMAGES: CHEST - 2 VIEW 05/23/2018  COMPARISON:  08/27/2016  FINDINGS: The heart size and mediastinal contours are within normal limits. Both lungs are clear. Mild degenerative changes of the spine.  IMPRESSION: No active cardiopulmonary disease.   EKG: 05/23/2018: Normal sinus rhythm with sinus arrhythmia  CV: TTE 02/11/2018: Interpretation summary: A complete two-dimensional transthoracic echocardiogram with color flow Doppler and spectral Doppler was performed.  The study was technically difficult.  The left ventricle is normal in size, wall thickness and wall motion with ejection fraction of 60 to 55%.  Left ventricular diastolic function is normal.  Right ventricular systolic pressure was normal.  The aortic valve is not well visualized, was grossly normal.  Left ventricle: Left ventricle is normal in size, wall thickness and wall motion with ejection fraction 65%.  Left ventricular diastolic function is normal.  Right ventricle: The right ventricle is normal in structure and function  Atria: The left atrium is normal in size.  The right atrium is normal.  Mitral valve: The mitral valve leaflets appear normal.  There is no evidence of stenosis, fluttering, or prolapse.  Tricuspid valve: The tricuspid valve leaflets are thin and pliable valve motion is normal.  There is trace tricuspid regurgitation.  Right ventricular systolic pressure was normal.  Aortic valve: Aortic valve: Well visualized.  The aortic valve was not well visualized, normal.  Pulmonic valve: The pulmonic valve is not well visualized.  Pulmonic valve was not well seen,  but is grossly normal.  Vessels: Aortic root is normal in diameter.  Pericardium: There is no pericardial effusion.   Past Medical History:  Diagnosis Date  . Anemia    taking iron now. pt states having no current issues 09/02/2015  . Anginal pain ([place])    pt states experiences chest wall pain pt states related to her asthma   . Anxiety    with MRI's  . Arthritis    Everywhere  . Asthma   . Dizziness   . GERD (gastroesophageal reflux disease)   . Headache(784.0)    HX OF MIGRAINES  . History of bronchitis   . Hypertension   . Hypothyroidism    takes levothyroxen  . OSA on CPAP   . Shortness of breath    with exertion  . Wears glasses     Past Surgical History:  Procedure Laterality Date  . CESAREAN SECTION     times 2  . CHOLECYSTECTOMY    . ENDOMETRIAL ABLATION    . INCISION AND DRAINAGE HIP Left 11/10/2015   Procedure: IRRIGATION AND DEBRIDEMENT LEFT HIP INCISION;  Surgeon: Mcarthur Rossetti, MD;  Location: [facility];  Service: Orthopedics;  Laterality: Left;  . JOINT REPLACEMENT  2011   total left knee  . KNEE ARTHROPLASTY  04/23/2012   right   . KNEE ARTHROSCOPY    . ROTATOR CUFF REPAIR     left   . SHOULDER SURGERY     right to repair ligament tear  . TOTAL HIP ARTHROPLASTY Left 09/09/2015   Procedure: LEFT TOTAL HIP ARTHROPLASTY ANTERIOR APPROACH;  Surgeon: Mcarthur Rossetti, MD;  Location: [facility];  Service: Orthopedics;  Laterality: Left;  . TOTAL KNEE ARTHROPLASTY  04/23/2012   Procedure: TOTAL KNEE ARTHROPLASTY;  Surgeon: Newt Minion, MD;  Location: [facility];  Service: Orthopedics;  Laterality: Right;  Right Total Knee Arthroplasty  . TUBAL LIGATION  1996    MEDICATIONS: . albuterol (PROVENTIL HFA;VENTOLIN HFA) 108 (90 BASE) MCG/ACT inhaler  . albuterol (PROVENTIL) (2.5 MG/3ML) 0.083% nebulizer solution  . buprenorphine (SUBUTEX) 8 MG SUBL SL tablet  . cetirizine (ZYRTEC) 10 MG tablet  . cyclobenzaprine (FLEXERIL) 10 MG tablet  . diclofenac  (VOLTAREN) 75 MG EC tablet  . diphenhydrAMINE (BENADRYL) 25 MG tablet  . ergocalciferol (VITAMIN D2) 50000 units capsule  . furosemide (LASIX) 20 MG tablet  . gabapentin (NEURONTIN) 300 MG capsule  . hydrochlorothiazide 25 MG tablet  . levothyroxine (SYNTHROID, LEVOTHROID) 137 MCG tablet  . metoCLOPramide (REGLAN) 5 MG tablet  . NARCAN 4 MG/0.1ML LIQD nasal spray kit  . ondansetron (ZOFRAN ODT) 8 MG disintegrating tablet  . oxybutynin (DITROPAN-XL) 10 MG 24 hr tablet  . oxyCODONE-acetaminophen (PERCOCET/ROXICET) 5-325 MG tablet  . pantoprazole (PROTONIX) 20 MG tablet  . potassium chloride (K-DUR,KLOR-CON) 10 MEQ tablet   No current facility-administered medications for this encounter.      Wynonia Musty [facility] Short Stay Center/Anesthesiology Phone [phone] 05/26/2018 9:33 AM

## 2018-05-27 ENCOUNTER — Inpatient Hospital Stay (HOSPITAL_COMMUNITY): Payer: Medicare Other | Admitting: Physician Assistant

## 2018-05-27 ENCOUNTER — Inpatient Hospital Stay (HOSPITAL_COMMUNITY): Payer: Medicare Other

## 2018-05-27 ENCOUNTER — Other Ambulatory Visit: Payer: Self-pay

## 2018-05-27 ENCOUNTER — Inpatient Hospital Stay (HOSPITAL_COMMUNITY): Payer: Medicare Other | Admitting: Anesthesiology

## 2018-05-27 ENCOUNTER — Inpatient Hospital Stay (HOSPITAL_COMMUNITY)
Admission: RE | Admit: 2018-05-27 | Discharge: 2018-05-30 | DRG: 460 | Disposition: A | Discharge status: Home Health | Payer: Medicare Other | Source: Ambulatory Visit | Attending: Specialist | Admitting: Specialist

## 2018-05-27 ENCOUNTER — Encounter (HOSPITAL_COMMUNITY): Payer: Self-pay | Admitting: Surgery

## 2018-05-27 ENCOUNTER — Inpatient Hospital Stay (HOSPITAL_COMMUNITY)
Admission: RE | Disposition: A | Discharge status: Home Health | Payer: Self-pay | Source: Ambulatory Visit | Attending: Specialist

## 2018-05-27 DIAGNOSIS — J45909 Unspecified asthma, uncomplicated: Secondary | ICD-10-CM | POA: Diagnosis present

## 2018-05-27 DIAGNOSIS — Z96653 Presence of artificial knee joint, bilateral: Secondary | ICD-10-CM | POA: Diagnosis present

## 2018-05-27 DIAGNOSIS — Z833 Family history of diabetes mellitus: Secondary | ICD-10-CM | POA: Diagnosis not present

## 2018-05-27 DIAGNOSIS — M48062 Spinal stenosis, lumbar region with neurogenic claudication: Secondary | ICD-10-CM

## 2018-05-27 DIAGNOSIS — Z6841 Body Mass Index (BMI) 40.0 and over, adult: Secondary | ICD-10-CM | POA: Diagnosis not present

## 2018-05-27 DIAGNOSIS — G4733 Obstructive sleep apnea (adult) (pediatric): Secondary | ICD-10-CM | POA: Diagnosis present

## 2018-05-27 DIAGNOSIS — Z888 Allergy status to other drugs, medicaments and biological substances status: Secondary | ICD-10-CM

## 2018-05-27 DIAGNOSIS — Z8042 Family history of malignant neoplasm of prostate: Secondary | ICD-10-CM | POA: Diagnosis not present

## 2018-05-27 DIAGNOSIS — N390 Urinary tract infection, site not specified: Secondary | ICD-10-CM | POA: Diagnosis not present

## 2018-05-27 DIAGNOSIS — M4326 Fusion of spine, lumbar region: Secondary | ICD-10-CM | POA: Diagnosis present

## 2018-05-27 DIAGNOSIS — M1990 Unspecified osteoarthritis, unspecified site: Secondary | ICD-10-CM | POA: Diagnosis present

## 2018-05-27 DIAGNOSIS — Z9049 Acquired absence of other specified parts of digestive tract: Secondary | ICD-10-CM

## 2018-05-27 DIAGNOSIS — D62 Acute posthemorrhagic anemia: Secondary | ICD-10-CM | POA: Diagnosis not present

## 2018-05-27 DIAGNOSIS — Z96642 Presence of left artificial hip joint: Secondary | ICD-10-CM | POA: Diagnosis present

## 2018-05-27 DIAGNOSIS — Z91040 Latex allergy status: Secondary | ICD-10-CM

## 2018-05-27 DIAGNOSIS — M4316 Spondylolisthesis, lumbar region: Secondary | ICD-10-CM | POA: Diagnosis not present

## 2018-05-27 DIAGNOSIS — I10 Essential (primary) hypertension: Secondary | ICD-10-CM | POA: Diagnosis present

## 2018-05-27 DIAGNOSIS — M48061 Spinal stenosis, lumbar region without neurogenic claudication: Secondary | ICD-10-CM | POA: Diagnosis present

## 2018-05-27 DIAGNOSIS — Z8249 Family history of ischemic heart disease and other diseases of the circulatory system: Secondary | ICD-10-CM

## 2018-05-27 DIAGNOSIS — M4056 Lordosis, unspecified, lumbar region: Secondary | ICD-10-CM | POA: Diagnosis present

## 2018-05-27 DIAGNOSIS — Z800 Family history of malignant neoplasm of digestive organs: Secondary | ICD-10-CM

## 2018-05-27 DIAGNOSIS — E039 Hypothyroidism, unspecified: Secondary | ICD-10-CM | POA: Diagnosis present

## 2018-05-27 DIAGNOSIS — Z23 Encounter for immunization: Secondary | ICD-10-CM

## 2018-05-27 DIAGNOSIS — M549 Dorsalgia, unspecified: Secondary | ICD-10-CM | POA: Diagnosis present

## 2018-05-27 DIAGNOSIS — Z419 Encounter for procedure for purposes other than remedying health state, unspecified: Secondary | ICD-10-CM

## 2018-05-27 DIAGNOSIS — Z886 Allergy status to analgesic agent status: Secondary | ICD-10-CM

## 2018-05-27 DIAGNOSIS — G4730 Sleep apnea, unspecified: Secondary | ICD-10-CM | POA: Diagnosis present

## 2018-05-27 DIAGNOSIS — K219 Gastro-esophageal reflux disease without esophagitis: Secondary | ICD-10-CM | POA: Diagnosis present

## 2018-05-27 DIAGNOSIS — Z885 Allergy status to narcotic agent status: Secondary | ICD-10-CM

## 2018-05-27 DIAGNOSIS — Z87891 Personal history of nicotine dependence: Secondary | ICD-10-CM

## 2018-05-27 DIAGNOSIS — Z820 Family history of epilepsy and other diseases of the nervous system: Secondary | ICD-10-CM | POA: Diagnosis not present

## 2018-05-27 DIAGNOSIS — Z79890 Hormone replacement therapy (postmenopausal): Secondary | ICD-10-CM

## 2018-05-27 DIAGNOSIS — Z91030 Bee allergy status: Secondary | ICD-10-CM

## 2018-05-27 DIAGNOSIS — G43909 Migraine, unspecified, not intractable, without status migrainosus: Secondary | ICD-10-CM | POA: Diagnosis present

## 2018-05-27 DIAGNOSIS — Z91018 Allergy to other foods: Secondary | ICD-10-CM

## 2018-05-27 LAB — GLUCOSE, CAPILLARY: Glucose-Capillary: 189 mg/dL — ABNORMAL HIGH (ref 70–99)

## 2018-05-27 SURGERY — POSTERIOR LUMBAR FUSION 2 LEVEL
Anesthesia: General | Site: Spine Lumbar

## 2018-05-27 MED ORDER — METHOCARBAMOL 500 MG PO TABS
ORAL_TABLET | ORAL | Status: AC
  Filled 2018-05-27: qty 1

## 2018-05-27 MED ORDER — MENTHOL 3 MG MT LOZG: 1.0000 | LOZENGE | OROMUCOSAL | Status: DC | PRN

## 2018-05-27 MED ORDER — METHOCARBAMOL 1000 MG/10ML IJ SOLN: 500.0000 mg | Freq: Four times a day (QID) | INTRAVENOUS | Status: DC | PRN

## 2018-05-27 MED ORDER — OXYCODONE HCL 5 MG PO TABS
ORAL_TABLET | ORAL | Status: AC
  Filled 2018-05-27: qty 2

## 2018-05-27 MED ORDER — THROMBIN 20000 UNITS EX KIT
PACK | CUTANEOUS | Status: AC
  Filled 2018-05-27: qty 1

## 2018-05-27 MED ORDER — GABAPENTIN 300 MG PO CAPS
300.0000 mg | ORAL_CAPSULE | Freq: Three times a day (TID) | ORAL | Status: DC
  Administered 2018-05-28 – 2018-05-30 (×8): 300 mg via ORAL
  Filled 2018-05-27 (×8): qty 1

## 2018-05-27 MED ORDER — SODIUM CHLORIDE 0.9 % IV SOLN: 250.0000 mL | INTRAVENOUS | Status: DC

## 2018-05-27 MED ORDER — HYDROMORPHONE HCL 1 MG/ML IJ SOLN
0.5000 mg | INTRAMUSCULAR | Status: DC | PRN
  Administered 2018-05-27: 0.5 mg via INTRAVENOUS
  Filled 2018-05-27: qty 1

## 2018-05-27 MED ORDER — HYDROMORPHONE HCL 1 MG/ML IJ SOLN
INTRAMUSCULAR | Status: AC
  Administered 2018-05-27: 0.5 mg via INTRAVENOUS
  Filled 2018-05-27: qty 1

## 2018-05-27 MED ORDER — HYDROCHLOROTHIAZIDE 25 MG PO TABS
25.0000 mg | ORAL_TABLET | Freq: Every day | ORAL | Status: DC
  Administered 2018-05-28 – 2018-05-30 (×3): 25 mg via ORAL
  Filled 2018-05-27 (×3): qty 1

## 2018-05-27 MED ORDER — LABETALOL HCL 5 MG/ML IV SOLN
INTRAVENOUS | Status: AC
  Filled 2018-05-27: qty 4

## 2018-05-27 MED ORDER — ONDANSETRON HCL 4 MG/2ML IJ SOLN
INTRAMUSCULAR | Status: DC | PRN
  Administered 2018-05-27: 4 mg via INTRAVENOUS

## 2018-05-27 MED ORDER — ROCURONIUM BROMIDE 50 MG/5ML IV SOSY
PREFILLED_SYRINGE | INTRAVENOUS | Status: DC | PRN
  Administered 2018-05-27: 50 mg via INTRAVENOUS
  Administered 2018-05-27: 20 mg via INTRAVENOUS
  Administered 2018-05-27: 30 mg via INTRAVENOUS
  Administered 2018-05-27: 50 mg via INTRAVENOUS

## 2018-05-27 MED ORDER — MIDAZOLAM HCL 2 MG/2ML IJ SOLN
INTRAMUSCULAR | Status: AC
  Filled 2018-05-27: qty 2

## 2018-05-27 MED ORDER — CEFAZOLIN SODIUM-DEXTROSE 2-4 GM/100ML-% IV SOLN
2.0000 g | Freq: Three times a day (TID) | INTRAVENOUS | Status: AC
  Administered 2018-05-28 (×2): 2 g via INTRAVENOUS
  Filled 2018-05-27 (×2): qty 100

## 2018-05-27 MED ORDER — OXYCODONE HCL 5 MG PO TABS
5.0000 mg | ORAL_TABLET | ORAL | Status: DC | PRN
  Filled 2018-05-27: qty 1

## 2018-05-27 MED ORDER — DEXAMETHASONE SODIUM PHOSPHATE 10 MG/ML IJ SOLN
INTRAMUSCULAR | Status: DC | PRN
  Administered 2018-05-27: 10 mg via INTRAVENOUS

## 2018-05-27 MED ORDER — POTASSIUM CHLORIDE CRYS ER 10 MEQ PO TBCR
10.0000 meq | EXTENDED_RELEASE_TABLET | Freq: Two times a day (BID) | ORAL | Status: DC
  Administered 2018-05-28 – 2018-05-30 (×6): 10 meq via ORAL
  Filled 2018-05-27 (×6): qty 1

## 2018-05-27 MED ORDER — FLEET ENEMA 7-19 GM/118ML RE ENEM: 1.0000 | ENEMA | Freq: Once | RECTAL | Status: DC | PRN

## 2018-05-27 MED ORDER — CYCLOBENZAPRINE HCL 10 MG PO TABS
10.0000 mg | ORAL_TABLET | Freq: Three times a day (TID) | ORAL | Status: DC | PRN
  Administered 2018-05-30: 10 mg via ORAL
  Filled 2018-05-27: qty 1

## 2018-05-27 MED ORDER — BUPIVACAINE LIPOSOME 1.3 % IJ SUSP
INTRAMUSCULAR | Status: DC | PRN
  Administered 2018-05-27: 20 mL

## 2018-05-27 MED ORDER — SUGAMMADEX SODIUM 200 MG/2ML IV SOLN
INTRAVENOUS | Status: DC | PRN
  Administered 2018-05-27: 270 mg via INTRAVENOUS

## 2018-05-27 MED ORDER — EPHEDRINE 5 MG/ML INJ
INTRAVENOUS | Status: AC
  Filled 2018-05-27: qty 10

## 2018-05-27 MED ORDER — OXYCODONE HCL 5 MG PO TABS
10.0000 mg | ORAL_TABLET | ORAL | Status: DC | PRN
  Administered 2018-05-27 – 2018-05-30 (×3): 10 mg via ORAL
  Filled 2018-05-27 (×2): qty 2

## 2018-05-27 MED ORDER — ALBUTEROL SULFATE (2.5 MG/3ML) 0.083% IN NEBU: 3.0000 mL | INHALATION_SOLUTION | Freq: Four times a day (QID) | RESPIRATORY_TRACT | Status: DC | PRN

## 2018-05-27 MED ORDER — MIDAZOLAM HCL 5 MG/5ML IJ SOLN
INTRAMUSCULAR | Status: DC | PRN
  Administered 2018-05-27: 2 mg via INTRAVENOUS

## 2018-05-27 MED ORDER — PROPOFOL 10 MG/ML IV BOLUS
INTRAVENOUS | Status: AC
  Filled 2018-05-27: qty 40

## 2018-05-27 MED ORDER — ONDANSETRON 4 MG PO TBDP: 8.0000 mg | ORAL_TABLET | Freq: Three times a day (TID) | ORAL | Status: DC | PRN

## 2018-05-27 MED ORDER — LORATADINE 10 MG PO TABS
10.0000 mg | ORAL_TABLET | Freq: Every day | ORAL | Status: DC
  Administered 2018-05-28 – 2018-05-30 (×3): 10 mg via ORAL
  Filled 2018-05-27 (×3): qty 1

## 2018-05-27 MED ORDER — 0.9 % SODIUM CHLORIDE (POUR BTL) OPTIME
TOPICAL | Status: DC | PRN
  Administered 2018-05-27: 1000 mL

## 2018-05-27 MED ORDER — LIDOCAINE 2% (20 MG/ML) 5 ML SYRINGE
INTRAMUSCULAR | Status: DC | PRN
  Administered 2018-05-27: 60 mg via INTRAVENOUS

## 2018-05-27 MED ORDER — SCOPOLAMINE 1 MG/3DAYS TD PT72
MEDICATED_PATCH | TRANSDERMAL | Status: DC | PRN
  Administered 2018-05-27: 1 via TRANSDERMAL

## 2018-05-27 MED ORDER — AMOXICILLIN-POT CLAVULANATE 500-125 MG PO TABS: 1.0000 | ORAL_TABLET | Freq: Three times a day (TID) | ORAL | Status: DC

## 2018-05-27 MED ORDER — POLYETHYLENE GLYCOL 3350 17 G PO PACK
17.0000 g | PACK | Freq: Every day | ORAL | Status: DC | PRN
  Administered 2018-05-30: 17 g via ORAL
  Filled 2018-05-27: qty 1

## 2018-05-27 MED ORDER — PROMETHAZINE HCL 25 MG/ML IJ SOLN: 6.2500 mg | INTRAMUSCULAR | Status: DC | PRN

## 2018-05-27 MED ORDER — HEMOSTATIC AGENTS (NO CHARGE) OPTIME
TOPICAL | Status: DC | PRN
  Administered 2018-05-27: 1 via TOPICAL

## 2018-05-27 MED ORDER — ALUM & MAG HYDROXIDE-SIMETH 200-200-20 MG/5ML PO SUSP: 30.0000 mL | Freq: Four times a day (QID) | ORAL | Status: DC | PRN

## 2018-05-27 MED ORDER — PHENYLEPHRINE 40 MCG/ML (10ML) SYRINGE FOR IV PUSH (FOR BLOOD PRESSURE SUPPORT)
PREFILLED_SYRINGE | INTRAVENOUS | Status: AC
  Filled 2018-05-27: qty 10

## 2018-05-27 MED ORDER — HYDROMORPHONE HCL 1 MG/ML IJ SOLN
INTRAMUSCULAR | Status: AC
  Filled 2018-05-27: qty 1

## 2018-05-27 MED ORDER — THROMBIN 20000 UNITS EX SOLR
CUTANEOUS | Status: DC | PRN
  Administered 2018-05-27: 20 mL via TOPICAL

## 2018-05-27 MED ORDER — BUPRENORPHINE HCL-NALOXONE HCL 8-2 MG SL SUBL
1.0000 | SUBLINGUAL_TABLET | Freq: Every day | SUBLINGUAL | Status: DC
  Administered 2018-05-28 – 2018-05-30 (×3): 1 via SUBLINGUAL
  Filled 2018-05-27 (×4): qty 1

## 2018-05-27 MED ORDER — ACETAMINOPHEN 325 MG PO TABS: 650.0000 mg | ORAL_TABLET | ORAL | Status: DC | PRN

## 2018-05-27 MED ORDER — PHENOL 1.4 % MT LIQD: 1.0000 | OROMUCOSAL | Status: DC | PRN

## 2018-05-27 MED ORDER — METOCLOPRAMIDE HCL 10 MG PO TABS
5.0000 mg | ORAL_TABLET | Freq: Three times a day (TID) | ORAL | Status: DC
  Administered 2018-05-28 – 2018-05-30 (×7): 5 mg via ORAL
  Filled 2018-05-27 (×7): qty 1

## 2018-05-27 MED ORDER — SUGAMMADEX SODIUM 500 MG/5ML IV SOLN
INTRAVENOUS | Status: AC
  Filled 2018-05-27: qty 5

## 2018-05-27 MED ORDER — LABETALOL HCL 5 MG/ML IV SOLN
INTRAVENOUS | Status: DC | PRN
  Administered 2018-05-27 (×3): 10 mg via INTRAVENOUS

## 2018-05-27 MED ORDER — ROCURONIUM BROMIDE 50 MG/5ML IV SOSY
PREFILLED_SYRINGE | INTRAVENOUS | Status: AC
  Filled 2018-05-27: qty 15

## 2018-05-27 MED ORDER — ARTIFICIAL TEARS OPHTHALMIC OINT
TOPICAL_OINTMENT | OPHTHALMIC | Status: AC
  Filled 2018-05-27: qty 3.5

## 2018-05-27 MED ORDER — LIDOCAINE 2% (20 MG/ML) 5 ML SYRINGE
INTRAMUSCULAR | Status: AC
  Filled 2018-05-27: qty 5

## 2018-05-27 MED ORDER — DEXAMETHASONE SODIUM PHOSPHATE 10 MG/ML IJ SOLN
INTRAMUSCULAR | Status: AC
  Filled 2018-05-27: qty 1

## 2018-05-27 MED ORDER — DOCUSATE SODIUM 100 MG PO CAPS
100.0000 mg | ORAL_CAPSULE | Freq: Two times a day (BID) | ORAL | Status: DC
  Administered 2018-05-28 – 2018-05-30 (×6): 100 mg via ORAL
  Filled 2018-05-27 (×6): qty 1

## 2018-05-27 MED ORDER — CHLORHEXIDINE GLUCONATE 4 % EX LIQD: 60.0000 mL | Freq: Once | CUTANEOUS | Status: DC

## 2018-05-27 MED ORDER — OXYCODONE HCL ER 10 MG PO T12A
10.0000 mg | EXTENDED_RELEASE_TABLET | Freq: Two times a day (BID) | ORAL | Status: DC
  Administered 2018-05-28 – 2018-05-30 (×6): 10 mg via ORAL
  Filled 2018-05-27 (×6): qty 1

## 2018-05-27 MED ORDER — ALBUTEROL SULFATE (2.5 MG/3ML) 0.083% IN NEBU
2.5000 mg | INHALATION_SOLUTION | Freq: Three times a day (TID) | RESPIRATORY_TRACT | Status: DC
  Administered 2018-05-28 (×3): 2.5 mg via RESPIRATORY_TRACT
  Filled 2018-05-27 (×3): qty 3

## 2018-05-27 MED ORDER — BISACODYL 5 MG PO TBEC
5.0000 mg | DELAYED_RELEASE_TABLET | Freq: Every day | ORAL | Status: DC | PRN
  Administered 2018-05-29: 5 mg via ORAL
  Filled 2018-05-27 (×2): qty 1

## 2018-05-27 MED ORDER — DIPHENHYDRAMINE HCL 25 MG PO CAPS
25.0000 mg | ORAL_CAPSULE | Freq: Four times a day (QID) | ORAL | Status: DC | PRN
  Administered 2018-05-28 – 2018-05-29 (×2): 25 mg via ORAL
  Filled 2018-05-27 (×2): qty 1

## 2018-05-27 MED ORDER — BUPIVACAINE HCL (PF) 0.5 % IJ SOLN
INTRAMUSCULAR | Status: AC
  Filled 2018-05-27: qty 30

## 2018-05-27 MED ORDER — BUPIVACAINE HCL 0.5 % IJ SOLN
INTRAMUSCULAR | Status: DC | PRN
  Administered 2018-05-27: 20 mL

## 2018-05-27 MED ORDER — PHENYLEPHRINE HCL 10 MG/ML IJ SOLN
INTRAMUSCULAR | Status: DC | PRN
  Administered 2018-05-27: 80 ug via INTRAVENOUS
  Administered 2018-05-27: 40 ug via INTRAVENOUS

## 2018-05-27 MED ORDER — FENTANYL CITRATE (PF) 250 MCG/5ML IJ SOLN
INTRAMUSCULAR | Status: AC
  Filled 2018-05-27: qty 5

## 2018-05-27 MED ORDER — ACETAMINOPHEN 650 MG RE SUPP: 650.0000 mg | RECTAL | Status: DC | PRN

## 2018-05-27 MED ORDER — DEXTROSE 5 % IV SOLN
3.0000 g | INTRAVENOUS | Status: DC
  Filled 2018-05-27: qty 3000

## 2018-05-27 MED ORDER — ONDANSETRON HCL 4 MG/2ML IJ SOLN
INTRAMUSCULAR | Status: AC
  Filled 2018-05-27: qty 2

## 2018-05-27 MED ORDER — KETOROLAC TROMETHAMINE 15 MG/ML IJ SOLN
15.0000 mg | Freq: Four times a day (QID) | INTRAMUSCULAR | Status: AC
  Administered 2018-05-27 – 2018-05-28 (×3): 15 mg via INTRAVENOUS
  Filled 2018-05-27 (×2): qty 1

## 2018-05-27 MED ORDER — BUPIVACAINE LIPOSOME 1.3 % IJ SUSP
20.0000 mL | Freq: Once | INTRAMUSCULAR | Status: DC
  Filled 2018-05-27: qty 20

## 2018-05-27 MED ORDER — LACTATED RINGERS IV SOLN
INTRAVENOUS | Status: DC | PRN
  Administered 2018-05-27 (×2): via INTRAVENOUS

## 2018-05-27 MED ORDER — NALOXONE HCL 4 MG/0.1ML NA LIQD: 0.4000 mg | Freq: Once | NASAL | Status: DC

## 2018-05-27 MED ORDER — SODIUM CHLORIDE 0.9% FLUSH
3.0000 mL | Freq: Two times a day (BID) | INTRAVENOUS | Status: DC
  Administered 2018-05-28 – 2018-05-30 (×6): 3 mL via INTRAVENOUS

## 2018-05-27 MED ORDER — SODIUM CHLORIDE 0.9 % IV SOLN
INTRAVENOUS | Status: DC | PRN
  Administered 2018-05-27: 25 ug/min via INTRAVENOUS

## 2018-05-27 MED ORDER — PROPOFOL 10 MG/ML IV BOLUS
INTRAVENOUS | Status: DC | PRN
  Administered 2018-05-27: 200 mg via INTRAVENOUS
  Administered 2018-05-27: 100 mg via INTRAVENOUS

## 2018-05-27 MED ORDER — LEVOTHYROXINE SODIUM 25 MCG PO TABS
137.0000 ug | ORAL_TABLET | Freq: Every day | ORAL | Status: DC
  Administered 2018-05-28 – 2018-05-30 (×3): 137 ug via ORAL
  Filled 2018-05-27 (×3): qty 1

## 2018-05-27 MED ORDER — VITAMIN D (ERGOCALCIFEROL) 1.25 MG (50000 UNIT) PO CAPS: 50000.0000 [IU] | ORAL_CAPSULE | ORAL | Status: DC

## 2018-05-27 MED ORDER — SODIUM CHLORIDE 0.9 % IV SOLN
INTRAVENOUS | Status: DC
  Administered 2018-05-28: via INTRAVENOUS

## 2018-05-27 MED ORDER — OXYBUTYNIN CHLORIDE ER 10 MG PO TB24
10.0000 mg | ORAL_TABLET | Freq: Every day | ORAL | Status: DC
  Administered 2018-05-28 – 2018-05-30 (×3): 10 mg via ORAL
  Filled 2018-05-27 (×3): qty 1

## 2018-05-27 MED ORDER — FENTANYL CITRATE (PF) 100 MCG/2ML IJ SOLN
INTRAMUSCULAR | Status: DC | PRN
  Administered 2018-05-27 (×12): 50 ug via INTRAVENOUS

## 2018-05-27 MED ORDER — HYDROMORPHONE HCL 1 MG/ML IJ SOLN
0.2500 mg | INTRAMUSCULAR | Status: DC | PRN
  Administered 2018-05-27 (×4): 0.5 mg via INTRAVENOUS

## 2018-05-27 MED ORDER — SODIUM CHLORIDE 0.9% FLUSH: 3.0000 mL | INTRAVENOUS | Status: DC | PRN

## 2018-05-27 MED ORDER — LACTATED RINGERS IV SOLN
INTRAVENOUS | Status: DC | PRN
  Administered 2018-05-27: 07:00:00 via INTRAVENOUS

## 2018-05-27 MED ORDER — METHOCARBAMOL 500 MG PO TABS
500.0000 mg | ORAL_TABLET | Freq: Four times a day (QID) | ORAL | Status: DC | PRN
  Administered 2018-05-27 – 2018-05-30 (×3): 500 mg via ORAL
  Filled 2018-05-27 (×2): qty 1

## 2018-05-27 MED ORDER — KETOROLAC TROMETHAMINE 15 MG/ML IJ SOLN
INTRAMUSCULAR | Status: AC
  Filled 2018-05-27: qty 1

## 2018-05-27 MED ORDER — SCOPOLAMINE 1 MG/3DAYS TD PT72
MEDICATED_PATCH | TRANSDERMAL | Status: AC
  Filled 2018-05-27: qty 1

## 2018-05-27 MED ORDER — FUROSEMIDE 20 MG PO TABS: 20.0000 mg | ORAL_TABLET | Freq: Every day | ORAL | Status: DC | PRN

## 2018-05-27 MED ORDER — PANTOPRAZOLE SODIUM 20 MG PO TBEC: 20.0000 mg | DELAYED_RELEASE_TABLET | Freq: Two times a day (BID) | ORAL | Status: DC

## 2018-05-27 SURGICAL SUPPLY — 72 items
BLADE CLIPPER SURG (BLADE) IMPLANT
BONE CANC CHIPS 20CC PCAN1/4 (Bone Implant) ×2 IMPLANT
BONE VIVIGEN FORMABLE 5.4CC (Bone Implant) ×4 IMPLANT
BUR MATCHSTICK NEURO 3.0 LAGG (BURR) ×2 IMPLANT
BUR RND FLUTED 2.5 (BURR) IMPLANT
BUR SABER RD CUTTING 3.0 (BURR) IMPLANT
CHIPS CANC BONE 20CC PCAN1/4 (Bone Implant) ×1 IMPLANT
COVER BACK TABLE 80X110 HD (DRAPES) ×4 IMPLANT
COVER MAYO STAND STRL (DRAPES) ×2 IMPLANT
COVER SURGICAL LIGHT HANDLE (MISCELLANEOUS) ×2 IMPLANT
DERMABOND ADVANCED (GAUZE/BANDAGES/DRESSINGS)
DERMABOND ADVANCED .7 DNX12 (GAUZE/BANDAGES/DRESSINGS) IMPLANT
DRAPE C-ARM 42X72 X-RAY (DRAPES) ×4 IMPLANT
DRAPE C-ARMOR (DRAPES) ×2 IMPLANT
DRAPE INCISE IOBAN 66X45 STRL (DRAPES) ×2 IMPLANT
DRAPE MICROSCOPE LEICA (MISCELLANEOUS) ×2 IMPLANT
DRAPE SURG 17X23 STRL (DRAPES) ×8 IMPLANT
DRSG MEPILEX BORDER 4X4 (GAUZE/BANDAGES/DRESSINGS) IMPLANT
DRSG MEPILEX BORDER 4X8 (GAUZE/BANDAGES/DRESSINGS) ×2 IMPLANT
DURAPREP 26ML APPLICATOR (WOUND CARE) ×2 IMPLANT
ELECT BLADE 6.5 EXT (BLADE) ×4 IMPLANT
ELECT CAUTERY BLADE 6.4 (BLADE) ×2 IMPLANT
ELECT REM PT RETURN 9FT ADLT (ELECTROSURGICAL) ×2
ELECTRODE REM PT RTRN 9FT ADLT (ELECTROSURGICAL) ×1 IMPLANT
EVACUATOR 1/8 PVC DRAIN (DRAIN) IMPLANT
GLOVE BIOGEL PI IND STRL 8 (GLOVE) ×1 IMPLANT
GLOVE BIOGEL PI INDICATOR 8 (GLOVE) ×1
GLOVE ECLIPSE 9.0 STRL (GLOVE) ×2 IMPLANT
GLOVE ORTHO TXT STRL SZ7.5 (GLOVE) ×2 IMPLANT
GLOVE SURG 8.5 LATEX PF (GLOVE) ×2 IMPLANT
GOWN STRL REUS W/ TWL LRG LVL3 (GOWN DISPOSABLE) ×1 IMPLANT
GOWN STRL REUS W/TWL 2XL LVL3 (GOWN DISPOSABLE) ×4 IMPLANT
GOWN STRL REUS W/TWL LRG LVL3 (GOWN DISPOSABLE) ×1
KIT BASIN OR (CUSTOM PROCEDURE TRAY) ×2 IMPLANT
KIT POSITION SURG JACKSON T1 (MISCELLANEOUS) ×2 IMPLANT
KIT TURNOVER KIT B (KITS) ×2 IMPLANT
MANIFOLD NEPTUNE II (INSTRUMENTS) ×2 IMPLANT
NEEDLE 22X1 1/2 (OR ONLY) (NEEDLE) ×2 IMPLANT
NEEDLE SPNL 18GX3.5 QUINCKE PK (NEEDLE) ×2 IMPLANT
NS IRRIG 1000ML POUR BTL (IV SOLUTION) ×8 IMPLANT
PACK LAMINECTOMY ORTHO (CUSTOM PROCEDURE TRAY) ×2 IMPLANT
PAD ARMBOARD 7.5X6 YLW CONV (MISCELLANEOUS) ×4 IMPLANT
PATTIES SURGICAL .75X.75 (GAUZE/BANDAGES/DRESSINGS) IMPLANT
PATTIES SURGICAL 1X1 (DISPOSABLE) ×2 IMPLANT
ROD EXPEDIUM PER BENT 65MM (Rod) ×4 IMPLANT
SCREW CORT FIX FEN 5.5X7X35MM (Screw) ×4 IMPLANT
SCREW CORT FIX FEN 5.5X7X40MM (Screw) ×4 IMPLANT
SCREW SET SINGLE INNER (Screw) ×12 IMPLANT
SCREW VIPER 7X45MM (Screw) ×4 IMPLANT
SPACER CONCORDE PRO 9X11X27 (Spacer) ×4 IMPLANT
SPONGE LAP 4X18 RFD (DISPOSABLE) ×8 IMPLANT
SPONGE SURGIFOAM ABS GEL 100 (HEMOSTASIS) ×2 IMPLANT
STAPLER VISISTAT (STAPLE) ×2 IMPLANT
STAPLER VISISTAT 35W (STAPLE) ×2 IMPLANT
SURGIFLO W/THROMBIN 8M KIT (HEMOSTASIS) ×2 IMPLANT
SUT VIC AB 0 CT1 27 (SUTURE) ×1
SUT VIC AB 0 CT1 27XBRD ANBCTR (SUTURE) ×1 IMPLANT
SUT VIC AB 1 CTX 36 (SUTURE) ×2
SUT VIC AB 1 CTX36XBRD ANBCTR (SUTURE) ×2 IMPLANT
SUT VIC AB 2-0 CT1 27 (SUTURE) ×1
SUT VIC AB 2-0 CT1 TAPERPNT 27 (SUTURE) ×1 IMPLANT
SUT VIC AB 3-0 X1 27 (SUTURE) ×2 IMPLANT
SYR 20CC LL (SYRINGE) ×2 IMPLANT
SYR CONTROL 10ML LL (SYRINGE) ×4 IMPLANT
TAP CANN VIPER2 DL 5.0 (TAP) ×2 IMPLANT
TAP CANN VIPER2 DL 6.0 (TAP) ×2 IMPLANT
TAP CANN VIPER2 DL 7.0 (TAP) ×2 IMPLANT
TOWEL GREEN STERILE (TOWEL DISPOSABLE) ×2 IMPLANT
TOWEL GREEN STERILE FF (TOWEL DISPOSABLE) ×2 IMPLANT
TRAY FOLEY MTR SLVR 16FR STAT (SET/KITS/TRAYS/PACK) IMPLANT
WATER STERILE IRR 1000ML POUR (IV SOLUTION) IMPLANT
YANKAUER SUCT BULB TIP NO VENT (SUCTIONS) ×2 IMPLANT

## 2018-05-27 NOTE — Transfer of Care (Signed)
Immediate Anesthesia Transfer of Care Note  Patient: Marilyn [name]  Procedure(s) Performed: TRANSFORAMINAL LUMBAR INTERBODY FUSION LEFT L4-5 AND L5-S1 WITH DEPUY MPACT SCREWS AND RODS, CONCORDE PROTI CAGES, LOCAL AND ALLOGRAFT BONE GRAFT, VIVIGEN (N/A Spine Lumbar)  Patient Location: PACU  Anesthesia Type:General  Level of Consciousness: awake and patient cooperative  Airway & Oxygen Therapy: Patient Spontanous Breathing  Post-op Assessment: Report given to RN and Post -op Vital signs reviewed and stable  Post vital signs: Reviewed and stable  Last Vitals:  Vitals Value Taken Time  BP 161/108 05/27/2018  4:35 PM  Temp    Pulse 71 05/27/2018  4:40 PM  Resp 6 05/27/2018  4:40 PM  SpO2 95 % 05/27/2018  4:40 PM  Vitals shown include unvalidated device data.  Last Pain:  Vitals:   05/27/18 0629  TempSrc:   PainSc: 0-No pain      Patients Stated Pain Goal: 3 (05/27/18 3382)  Complications: No apparent anesthesia complications

## 2018-05-27 NOTE — Interval H&P Note (Signed)
History and Physical Interval Note:  05/27/2018 7:41 AM  Marilyn [name]  has presented today for surgery, with the diagnosis of lumbar spondylolisthesis and severe bilateral foraminal stenosis L4-5 and L5-S1  The various methods of treatment have been discussed with the patient and family. After consideration of risks, benefits and other options for treatment, the patient has consented to  Procedure(s): TRANSFORAMINAL LUMBAR INTERBODY FUSION LEFT L4-5 AND L5-S1 WITH DEPUY MPACT SCREWS AND RODS, CONCORDE PROTI CAGES, LOCAL AND ALLOGRAFT BONE GRAFT, VIVIGEN (N/A) as a surgical intervention .  The patient's history has been reviewed, patient examined, no change in status, stable for surgery.  I have reviewed the patient's chart and labs.  Questions were answered to the patient's satisfaction.     Vira Browns

## 2018-05-27 NOTE — Anesthesia Preprocedure Evaluation (Addendum)
Anesthesia Evaluation  Patient identified by MRN, date of birth, ID band Patient awake    Reviewed: Allergy & Precautions, NPO status , Patient's Chart, lab work & pertinent test results  Airway Mallampati: II  TM Distance: >3 FB Neck ROM: Full    Dental no notable dental hx.    Pulmonary asthma , sleep apnea and Continuous Positive Airway Pressure Ventilation , former smoker,    Pulmonary exam normal breath sounds clear to auscultation       Cardiovascular hypertension, Pt. on medications Normal cardiovascular exam Rhythm:Regular Rate:Normal  ECG: NSR, rate 76  Pt was seen by cardiology 02/07/2018 for eval of bilateral LE edema. Per notes in care everywhere her edema was felt to be more due to morbid obesity and venous insufficiency. Echo was ordered which showed EF 60-65% with normal wall motion and no gross valvular abnormality.  Sharon Seller, NP is Cardiology provider last seen 02/07/2018   Neuro/Psych  Headaches, Anxiety    GI/Hepatic Neg liver ROS,   Endo/Other  Hypothyroidism Morbid obesitySuper obese  Renal/GU negative Renal ROS     Musculoskeletal  (+) Arthritis , Chronic pain on narcotics   Abdominal   Peds  Hematology  (+) anemia ,   Anesthesia Other Findings lumbar spondylolisthesis and severe bilateral foraminal stenosis L4-5 and L5-S1  Reproductive/Obstetrics S/p TUBAL LIGATION                            Anesthesia Physical Anesthesia Plan  ASA: IV  Anesthesia Plan: General   Post-op Pain Management:    Induction: Intravenous  PONV Risk Score and Plan: 4 or greater and Midazolam, Scopolamine patch - Pre-op, Dexamethasone, Ondansetron and Treatment may vary due to age or medical condition  Airway Management Planned: Oral ETT  Additional Equipment: Arterial line  Intra-op Plan:   Post-operative Plan: Extubation in OR  Informed Consent: I have reviewed the  patients History and Physical, chart, labs and discussed the procedure including the risks, benefits and alternatives for the proposed anesthesia with the patient or authorized representative who has indicated his/her understanding and acceptance.   Dental advisory given  Plan Discussed with: CRNA  Anesthesia Plan Comments:         Anesthesia Quick Evaluation

## 2018-05-27 NOTE — Anesthesia Procedure Notes (Signed)
Arterial Line Insertion Start/End9/06/2018 9:15 AM, 05/27/2018 9:30 AM Performed by: Leonides Grills, MD, anesthesiologist  Patient location: OR. Preanesthetic checklist: patient identified, IV checked, site marked, risks and benefits discussed, surgical consent, monitors and equipment checked, pre-op evaluation, timeout performed and anesthesia consent Right, radial was placed Catheter size: 20 Fr Hand hygiene performed , maximum sterile barriers used  and Seldinger technique used  Attempts: 1 Procedure performed without using ultrasound guided technique. Following insertion, dressing applied and Biopatch. Post procedure assessment: normal and unchanged  Patient tolerated the procedure well with no immediate complications.

## 2018-05-27 NOTE — Op Note (Signed)
05/27/2018  4:28 PM  PATIENT:  Marilyn [name]  48 y.o. female  MRN: [id number]  OPERATIVE REPORT  PRE-OPERATIVE DIAGNOSIS:  lumbar spondylolisthesis and severe bilateral foraminal stenosis L4-5 and L5-S1  POST-OPERATIVE DIAGNOSIS:  lumbar spondylolisthesis and severe bilateral foraminal stenosis  PROCEDURE:  Procedure(s): TRANSFORAMINAL LUMBAR INTERBODY FUSION LEFT L4-5 AND L5-S1 WITH DEPUY MPACT SCREWS AND RODS, CONCORDE PROTI CAGES, LOCAL AND ALLOGRAFT BONE GRAFT, VIVIGEN    SURGEON:  Jessy Oto, MD     ASSISTANT:  Benjiman Core, PA-C  (Present throughout the entire procedure and necessary for completion of procedure in a timely manner)     ANESTHESIA:  General, supplemented with local marcaine 0.5% 1:1 exparel 1.3% total 30cc, Dr. Roanna Banning    COMPLICATIONS:  None.  LINES: A-line right wrist, EJ line right neck.   EBL: 500CC  CELL SAVER BLOOD RETURNED: 200CC  DRAINS: Foley to SD.     COMPONENTS  Implant Name Type Inv. Item Serial No. Manufacturer Lot No. LRB No. Used  BONE VIVIGEN FORMABLE 5.4CC - [id number] Bone Implant BONE VIVIGEN FORMABLE 5.4CC [id number] [facility]  N/A 1  BONE CANC CHIPS 20CC - [id number] Bone Implant BONE [facility] CHIPS 20CC [id number] [facility] [place] [facility]  N/A 1  SPACER CONCORDE PRO [id number] - [id number] Spacer SPACER CONCORDE PRO [id number]  [facility] [id number] N/A 1  SPACER CONCORDE PRO [id number] - [id number] Spacer SPACER CONCORDE PRO [id number]  [place] [id number] N/A 1  BONE VIVIGEN FORMABLE 5.4CC - [id number] Bone Implant BONE VIVIGEN FORMABLE 5.4CC [id number] [facility] [place] [facility]  N/A 1  SCREW VIPER 7X45MM - [id number] Screw SCREW VIPER 7X45MM  [employer] [facility] [employer]  N/A 2  SCREW CORT FIX FEN 5.5X7X40MM - [id number] Screw SCREW CORT FIX FEN 5.5X7X40MM  [facility]  N/A 2  SCREW CORT FIX FEN 5.5X7X35MM - [id number] Screw SCREW CORT FIX FEN 5.5X7X35MM  [facility]  DEPUY SPINE  N/A 2  ROD EXPEDIUM PER BENT 65MM - [id number] Rod ROD EXPEDIUM PER BENT 65MM  [employer] [facility] [facility] [employer]  N/A 2  SCREW SET SINGLE INNER - [id number] Screw SCREW SET SINGLE INNER  [employer] [facility] [employer]  N/A 6  :   PROCEDURE: The patient was met in the holding area, and the appropriate lumbar levels left L4-5 and L5-S1 identified and marked with an "X" and my initials. I had discussion with the patient in the preop holding area regarding consent form.The fusion levels are reidentified as left L4-5 and L5-S1. Patient understands the rationale to perform TLIFs at two levels to decompress the left L4-5 and left L5-S1 lateral recess and foramenal stenosis and to allow for maintenance of lumbar lordosis. The patient was then transported to OR and was placed under general anesthetic without difficulty. The patient received appropriate preoperative antibiotic prophylaxis 3 gm Ancef.  Nursing staff inserted a Foley catheter under sterile conditions. The patient was then turned to a prone position using the [place] spine frame. PAS. all pressure points well padded the arms at the side to 90 90. Following the initial positioning decision by anesthesia was to return to a supine position on a stretcher in order to place a EJ line as the patients current IV was positional and to place a arterial blood pressure monitoring line in the right wrist radial artery. Following the placement of these lines the patient was again turned to a prone position on the [facility] spine table. All pressure points were  padded the arms at 90/90. The skin over the buttocks was placed under mild traction to remove redunancy in the lumbar area and skin folds that would make exposure more difficult. The legs held in place on the lower portion of the [place] table with  Belt and bilateral arm boards placed to prevent legs from migrating off the table due to her sice.Standard prep with DuraPrep solution draped in the usual manner from  the lower dorsal spine the mid sacral segment. Iodine Vi-Drape was used and the old incision scar was marked. Time-out procedure was called and correct. Skin in the midline between L3and S1 was then infiltrated with local anesthesia, marcaine 1/2% 1:1 exparel 1.3% total 20 cc used. Incision was then made  extending from L3-S2  through the skin and subcutaneous layers down to the patient's lumbodorsal fascia and spinous processes. The incision then carried sharply down to the supraspinous ligament and then continuing the lateral aspect of the spinous processes of L3, L4, L5 and S1. Cobb elevator used to carefully elevate the paralumbar muscles off of the posterior elements using electrocautery carefully drilled bleeding and perform dissection of the muscle tissues of the preserving the facet capsule at the L3-4. Continuing the exposure out laterally to expose the lateral margin of the facet joint line at L3-4, L4-5 and L5-S1. Incision was carried in the midline down to the S1 level area bleeders controlled using electrocautery monopolar electrocautery.  Insight self retaining retractor was used for the lower part of the incision and the cerebellar retractor cranially. C-arm fluoroscopy was then brought into the field and using C-arm fluoroscopy then a hole made into the medial aspect of the left pedicle of L4 observed in the pedicle using C arm at the 5 oclock position on the left L4 pedicle nerve probe initial entry was determined on fluoroscopy to be good position alignment so that a 4.35 mm tap was passed to 40 mm within the left L4 pedicle to a depth of nearly 40 mm observed on C-arm fluoroscopy to be beyond the midpoint of the lumbar vertebra and then position alignment within the left L4 pedicle this was then removed and the pedicle channel probed demonstrating patency no sign of rupture the cortex of the pedicle. Tapping with a 4 mm screw tap then 5 mm tap, then 6.71m tap and then a 7.0 mm tap, then 7.0 mm x  40 mm screw  was chosen to be placed later following decompression and TLIF. .Marland KitchenC-arm fluoroscopy was then brought into the field and using C-arm fluoroscopy then a hole made into the posterior medial aspect of the pedicle of right L4 observed in the pedicle using ball tipped nerve hook and hockey stick nerve probe initial entry was determined on fluoroscopy to be good position alignment so that 4.35 mm tap was then used to tap the right L4 pedicle to a depth of nearly 40 mm observed on C-arm fluoroscopy to be beyond the midpoint of the lumbar vertebra and then position alignment within the right L4 pedicle this was then removed and the pedicle channel probed demonstrating patency no sign of rupture the cortex of the pedicle. Tapping with a 5 mm screw tap, then a 6.0 mm tap and then a 7.0 mm tap, then a 7.0 mm x 40 mm screw was placed on the right side at the L4 level. C-arm fluoroscopy was then brought into the field and using C-arm fluoroscopy then a hole made into the posterior and medial aspect of the  left pedicle of L5 observed in the pedicle using ball tipped nerve hook and hockey stick nerve probe initial entry was determined on fluoroscopy to be good position alignment so that a 4.76m tap was then used to tap the left L5 pedicle to a depth of nearly 45 mm observed on C-arm fluoroscopy to be beyond the posterior one third of the lumbar vertebra and good position alignment within the left L5 pedicle this was then removed and the pedicle channel probed demonstrating patency no sign of rupture the cortex of the pedicle. Tapping with a 5 mm screw tap, then a 6.0 mm tap and then a 7.018mtap then 7.62m98m 45 mm screw  was chosen to be placed at the left L4 later following decompression and TLIF. . TMarland Kitchene pedicle channel of L5 on the left probed demonstrating patency no sign of rupture the cortex of the pedicle. C-arm fluoroscopy was then brought into the field and using C-arm fluoroscopy then a hole made into the  posterior and medial aspect of the right pedicle of L5 observed in the pedicle using ball tipped nerve hook and hockey stick nerve probe initial entry was determined on fluoroscopy to be good position alignment so that a 4.62mm36mp was then used to tap the left L5 pedicle to a depth of nearly 45 mm observed on C-arm fluoroscopy to be beyond the posterior one third of the lumbar vertebra and good position alignment within the right L5 pedicle this was then removed and the pedicle channel probed demonstrating patency no sign of rupture the cortex of the pedicle. Tapping with a 5 mm screw tap, and then a 6.0 mm tap and then a 7.0 mm tap,  Then a 7.62mm 762m5 mm screw was placed in the right L5 pedicle . The pedicle channel of L5 on the right probed demonstrating patency no sign of rupture the cortex of the pedicle. Viper screw for fixation of this level was measured as 7.0 mm x 45 mm screw for the right L5 pedicle. C-arm fluoroscopy was then brought into the field and using C-arm fluoroscopy then the hole into the pedicle of left S1 was placed and observed with ball-tipped probe the S1 pedicle on this side was 6.0 mm x 35 mm. A 6.62mm X22mmmca72mlly passed down the center of the S1 pedicle to a depth of nearly 35 mm. Observed on C-arm fluoroscopy to be in good position alignment channel was probed with a ball-tipped probe ensure patency no sign of cortical disruption. Following tapping with a 5 mm tap, the a 6.0 mm tap and a 7.62mm x 366mm screw  was chosen to be placed later following decompression and TLIF at the left side pedicle at S1. C-arm fluoroscopy was used to localize the hole made in the medial aspect of the pedicle of S1 on the right localizing the pedicle within the spinal canal with nerve hook and hockey-stick nerve probe carefully passed down the center of the S1 pedicle to a depth of nearly 35 mm. Observed on C-arm fluoroscopy to be in good position alignment channel was probed with a ball-tipped probe  ensure predicle screw  to be placed on the right side at the S1 level following decompression and TLIFs..C-arm fluoroscopy was used to localize the hole made in the lateral aspect of the pedicle of S1 on the right localizing the pedicle within the spinal canal with nerve hook and hockey-stick nerve probe re patency no sign of cortical disruption. Following tapping with  a 4 mm, 80m and 6 mm taps a 7.0 x 35 mm screw was placed in the right S1 pedicle..Army Chacorongeur used to resect inferior aspect of the lamina on the left side at the L4 level . The left medial 40% of the facet of L4-5 and L5-S1 were resected in order to decompress the left side of the lumbar thecal sac at  L4-5 and L5-S1 and decompressing the left L4 and L5 and S1 neuroforamen. Osteotomes and 219mand 28m24merrisons were used for this portion of the decompression. A near complete facetecomy on the left side at L4-5 and L5-S1. The left  L4 and L5 inferior portions of the lamina and pars were also resected first beginning with the Leksell rongeur and osteotomes and then resecting using 2 and 3 mm Kerrison. Continued laminectomy was carried out resecting the medial portions of the lamina of left L4 and L5 performing foraminotomies on the left side at the L5 and S1levels. The inferior articular process left L4 and right L5 were resected. The left L5 nerve root identified at the medial aspect of the L5 pedicle. Superior articular process of left L5 was then resected from the left side further decompressing the left L5 nerve and providing for exposure of the area just superior to the left L5 pedicle for a placement of left L4-5 cage.A large amount of hypertrophic ligmentum flavum was found impressing on the left lateral recesses at L4-5 and L5-S1 and narrowing the respective bilateral left L4, L5 and S1neuroforamen. Loupe magnification and headlight were used during the initial this portion procedure. The operating room OR microscope sterily draped was  brought to the surgical field for the end of the decompression portion of the case. Attention then turned to placement of the transforaminal lumbar interbody fusion cages. Using a Penfield 4 the left lateral aspect of the thecal sac at the L5-S1 disc space was carefully freed up The thecal sac could then easily be retracted in the posterior lateral aspect of the L5-S1 disc was exposed 15 blade scalpel used to incise the posterolateral disc and an osteotome used to resect a small portion of bone off the superior aspect of the posterior superior vertebral body of S1 in order to ease the entry into the L5-S1 disc space. A  4mm24mrrison rongeur was then able to be introduced in the disc space debrided it was not narrow. 9 mm dilator shaver was used to dialate the L5-S1 disc space on the left side attempts were made to dilate further to 11 mm were successful and using small curettes and the disc space was debrided a minimal degenerative disc present in the endplates debrided to bleeding endplate bone. Shavers were inserted to trial the intervertebral disc space. A 11 mm x 27mm628muy ProTi concorde cage was carefully packed with morcellized bone graft and the been harvested from previous laminotomies and vivigen.The cage was then inserted with the articulating insertion handle.  Additional local autogenous bone graft and vivigen and cancellous allograft chips was then packed into the intervertebral disc space. Bleeding controlled using bipolar electrocautery thrombin soaked gel cottonoids. Attention then turned to the left L4-5 level similarly the exposure the posterior lateral aspect this was carried out using a Penfield 4 bipolar electrocautery to control small bleeders present. Derricho retractor used to retract the thecal sac and left L4 nerve root a 15 blade scalpel was used to incise posterior lateral aspect of the left L4-5 disc the disc space at this level  showed a rather severe narrowing posteriorly was more open  anteriorly so that an osteotome again was used to resect a small portion the posterior superior lip of the vertebral body at L5  in order to gain ease of access into the L4-5 disc space. The space was debrided of degenerative disc material using pituitary along root the entire disc space was then debrided of degenerative disc material using pituitary rongeurs curettage down to bleeding bone endplates. Curettes both serrated and ring were used to debride the disc space and pituitary ronguers used to remove the loosened debris. This space was then carefully assess using spacers  a11.60m trial cage provided the best fit, the depuy ProTi concorde cage 11 mm x 27 mm with 5 degrees of lordosis was chosen so that the permanent 11.0 mm cage by 27 mm cage was packed with local bone graft and vivigen was placed into the left L4-5 intervertebral disc space. The posterior intervertebral disc space was packed with autogenous local bone graft as well as vivigen and allograft cancellous chips. Bleeding controlled using bipolar electrocautery.  Observed on C-arm fluoroscopy to be in good position alignment. The cages at L4-5 and L5-S1 were placed anteriorly as best as possible the correct patient's kyphosis that was present. With this then the transforaminal lumbar interbody fusion portion of the case was completed bleeders were controlled using bipolar electrocautery thrombin-soaked Gelfoam were appropriate.Decortication of the facet joints carried out bilateral L4-5 and L5-S1. These were packed with cancellous local bone graft, autograft and vivigen. The left L4, L5 and S1 pedicle screws on the left were then placed, the L5 and S1 viper corticofixation screws on the right were each placed and then each fastener carefully aligned  to allow for placement of rods. The right side first quarter inch precontoured titanium rod was then carefully place. This was then placed into the pedicle screw fasteners on the right extending from  L4-S1 each of the caps were carefully placed loosely tightened. Attention turned to the left side were similarly and then screws were carefully adjusted to allow for a better pattern screws to allow for placement of fixation of the rod a quarter inch 65 mm precontoured titanium rod was then carefully contoured. This was able to be inserted into the left pedicle screw fasteners, Caps onto the L4 fasteners were tightened to 80 foot lbs. Across the right side  L4-5 and L5-S1 screw fasteners compression was obtained on the right side between L4 and L5, then L5 andS1 by compressing between the fasteners and tightening the screw caps 85 pounds. Similarly this was done on the left side at L4-5 and L5-S1 obtaining compression and tightened 85 pounds. Irrigation was carried out with copious amounts of saline solution this was done throughout the case. Cell Saver was used during the case. Total of 200 cc of cell saver blood was returned. Hockey stick neuroprobe was used to probe the neuroforamen bilateral L4, L5 and S1, these were determined to be well decompressed. Permanent C-arm images were obtained in AP and lateral plane and oblique planes. Remaining local bone graft was then applied along both lateral posterior lateral region extending from L4 toS1 facet beds.Gelfoam was then removed spinal canal the lumbodorsal musculature carefully exam debrided of any devitalized tissue following removal of Insight retractors were the bleeders were controlled using electrocautery.The area dorsal lumbar muscle were then approximated in the midline with interrupted #1 Vicryl sutures loose the dorsal fascia was reattached to the spinous process of L3 to superiorly  and S1 inferiorly this was done with #1 Vicryl sutures. Subcutaneous layers then approximated using interrupted 0 Vicryl sutures and 2-0 Vicryl sutures. Skin was closed with a running subcutaneous stitch of 4-0 Vicryl Dermabond was applied then MedPlex bandage. All instrument  and sponge counts were correct. The patient was then returned to a supine position on her bed reactivated extubated and returned to the recovery room in satisfactory condition.   Benjiman Core, PA-C perform the duties of assistant surgeon during this case. He was present from the beginning of the case to the end of the case assisting in transfer the patient from his stretcher to the OR table and back to the stretcher at the end of the case. Assisted in careful retraction and suction of the laminectomy site delicate neural structures operating under the operating room microscope. He performed closure of the incision from the fascia to the skin applying the dressing.             Routing History       Basil Dess  05/27/2018, 4:28 PM

## 2018-05-27 NOTE — Brief Op Note (Signed)
05/27/2018  4:04 PM  PATIENT:  Marilyn [name]  47 y.o. female  PRE-OPERATIVE DIAGNOSIS:  lumbar spondylolisthesis and severe bilateral foraminal stenosis L4-5 and L5-S1  POST-OPERATIVE DIAGNOSIS:  lumbar spondylolisthesis and severe bilateral foraminal stenosis  PROCEDURE:  Procedure(s): TRANSFORAMINAL LUMBAR INTERBODY FUSION LEFT L4-5 AND L5-S1 WITH DEPUY MPACT SCREWS AND RODS, CONCORDE PROTI CAGES, LOCAL AND ALLOGRAFT BONE GRAFT, VIVIGEN (N/A)  SURGEON:  Surgeon(s) and Role:    * Kerrin Champagne, MD - Primary  PHYSICIAN ASSISTANT:James Owens,PA-C   ANESTHESIA:   local and general, Dr. Bradley Ferris MD.  EBL:  500 mL   BLOOD ADMINISTERED:200 CC CELLSAVER  DRAINS: Urinary Catheter (Foley)   LOCAL MEDICATIONS USED:  MARCAINE 0.5% 1:1 EXPAREL 1.3% Amount: 30 ml  SPECIMEN:  No Specimen  DISPOSITION OF SPECIMEN:  N/A  COUNTS:  YES  TOURNIQUET:  * No tourniquets in log *  DICTATION: .Dragon Dictation  PLAN OF CARE: Admit to inpatient   PATIENT DISPOSITION:  PACU - hemodynamically stable.   Delay start of Pharmacological VTE agent (>24hrs) due to surgical blood loss or risk of bleeding: yes

## 2018-05-27 NOTE — Discharge Instructions (Addendum)

## 2018-05-27 NOTE — Anesthesia Procedure Notes (Signed)
Procedure Name: Intubation Date/Time: 05/27/2018 8:03 AM Performed by: Fransisca Kaufmann, CRNA Pre-anesthesia Checklist: Patient identified, Emergency Drugs available, Suction available and Patient being monitored Patient Re-evaluated:Patient Re-evaluated prior to induction Oxygen Delivery Method: Circle System Utilized Preoxygenation: Pre-oxygenation with 100% oxygen Induction Type: IV induction Ventilation: Mask ventilation without difficulty Laryngoscope Size: Miller and 2 Grade View: Grade I Tube type: Oral Number of attempts: 1 Airway Equipment and Method: Stylet and Oral airway Placement Confirmation: ETT inserted through vocal cords under direct vision,  positive ETCO2 and breath sounds checked- equal and bilateral Secured at: 22 cm Tube secured with: Tape Dental Injury: Teeth and Oropharynx as per pre-operative assessment

## 2018-05-28 ENCOUNTER — Other Ambulatory Visit: Payer: Self-pay

## 2018-05-28 LAB — CBC
HCT: 29.3 % — ABNORMAL LOW (ref 36.0–46.0)
Hemoglobin: 9.1 g/dL — ABNORMAL LOW (ref 12.0–15.0)
MCH: 26.3 pg (ref 26.0–34.0)
MCHC: 31.1 g/dL (ref 30.0–36.0)
MCV: 84.7 fL (ref 78.0–100.0)
Platelets: 253 10*3/uL (ref 150–400)
RBC: 3.46 MIL/uL — ABNORMAL LOW (ref 3.87–5.11)
RDW: 14.5 % (ref 11.5–15.5)
WBC: 12.2 10*3/uL — ABNORMAL HIGH (ref 4.0–10.5)

## 2018-05-28 LAB — BASIC METABOLIC PANEL
ANION GAP: 10 (ref 5–15)
BUN: 8 mg/dL (ref 6–20)
CALCIUM: 8.9 mg/dL (ref 8.9–10.3)
CO2: 27 mmol/L (ref 22–32)
Chloride: 102 mmol/L (ref 98–111)
Creatinine, Ser: 1.11 mg/dL — ABNORMAL HIGH (ref 0.44–1.00)
GFR calc non Af Amer: 57 mL/min — ABNORMAL LOW (ref >60)
GLUCOSE: 119 mg/dL — AB (ref 70–99)
Potassium: 3.8 mmol/L (ref 3.5–5.1)
Sodium: 139 mmol/L (ref 135–145)

## 2018-05-28 MED ORDER — INFLUENZA VAC SPLIT QUAD 0.5 ML IM SUSY
0.5000 mL | PREFILLED_SYRINGE | INTRAMUSCULAR | Status: AC
  Administered 2018-05-30: 0.5 mL via INTRAMUSCULAR
  Filled 2018-05-28: qty 0.5

## 2018-05-28 MED ORDER — ALBUTEROL SULFATE (2.5 MG/3ML) 0.083% IN NEBU
2.5000 mg | INHALATION_SOLUTION | Freq: Two times a day (BID) | RESPIRATORY_TRACT | Status: DC
  Administered 2018-05-29 – 2018-05-30 (×3): 2.5 mg via RESPIRATORY_TRACT
  Filled 2018-05-28 (×3): qty 3

## 2018-05-28 MED FILL — Thrombin For Soln Kit 20000 Unit: CUTANEOUS | Qty: 1 | Status: AC

## 2018-05-28 NOTE — Progress Notes (Signed)
Orthopedic Tech Progress Note Patient Details:  Marilyn [name] 06/23/1971 [id number]  Patient ID: [name], female   DOB: [birth year], 47 y.o.   MRN: [id number]   Nikki Dom 05/28/2018, 9:32 AM Called in bio-tech brace order; spoke with [facility]

## 2018-05-28 NOTE — Anesthesia Postprocedure Evaluation (Signed)
Anesthesia Post Note  Patient: Saida M Connelly  Procedure(s) Performed: TRANSFORAMINAL LUMBAR INTERBODY FUSION LEFT L4-5 AND L5-S1 WITH DEPUY MPACT SCREWS AND RODS, CONCORDE PROTI CAGES, LOCAL AND ALLOGRAFT BONE GRAFT, VIVIGEN (N/A Spine Lumbar)     Patient location during evaluation: PACU Anesthesia Type: General Level of consciousness: awake and alert Pain management: pain level controlled Vital Signs Assessment: post-procedure vital signs reviewed and stable Respiratory status: spontaneous breathing, nonlabored ventilation, respiratory function stable and patient connected to nasal cannula oxygen Cardiovascular status: blood pressure returned to baseline and stable Postop Assessment: no apparent nausea or vomiting Anesthetic complications: no    Last Vitals:  Vitals:   05/28/18 2010 05/28/18 2020  BP:  (!) 97/56  Pulse:  78  Resp:    Temp:  36.5 C  SpO2: 98%     Last Pain:  Vitals:   05/28/18 2020  TempSrc: Oral  PainSc:                  Xayne Brumbaugh DAVID

## 2018-05-28 NOTE — Plan of Care (Signed)
  Problem: Education: Goal: Ability to verbalize activity precautions or restrictions will improve Outcome: Progressing Goal: Knowledge of the prescribed therapeutic regimen will improve Outcome: Progressing Goal: Understanding of discharge needs will improve Outcome: Progressing   Problem: Activity: Goal: Ability to avoid complications of mobility impairment will improve Outcome: Progressing Goal: Ability to tolerate increased activity will improve Outcome: Progressing Goal: Will remain free from falls Outcome: Progressing   Problem: Bowel/Gastric: Goal: Gastrointestinal status for postoperative course will improve Outcome: Progressing   Problem: Clinical Measurements: Goal: Ability to maintain clinical measurements within normal limits will improve Outcome: Progressing Goal: Postoperative complications will be avoided or minimized Outcome: Progressing Goal: Diagnostic test results will improve Outcome: Progressing   Problem: Skin Integrity: Goal: Will show signs of wound healing Outcome: Progressing   Problem: Health Behavior/Discharge Planning: Goal: Identification of resources available to assist in meeting health care needs will improve Outcome: Progressing   Problem: Bladder/Genitourinary: Goal: Urinary functional status for postoperative course will improve Outcome: Progressing

## 2018-05-28 NOTE — Evaluation (Signed)
Physical Therapy Evaluation Patient Details Name: Marilyn [name] MRN: [id number] DOB: 06/09/1971 Today's Date: 05/28/2018   History of Present Illness  47 y.o. female admitted on 05/27/18 for elective TLIF L4-5, L5-S1.  Pt with other significant PMH of bil TKA and L THA, HTN, HA, dizziness, asthma, anxiety, anemia, L RTC repair, R shoulder ligament repair.   Clinical Impression  Pt was limited to EOB mobility this AM as her brace was not present for treatment.  She reports decreased leg pain and did well with transitions to EOB and back into bed.  PT will await brace arrival to progress hallway ambulation with pt, but education initiated and home set up documented.  PT to follow acutely until d/c confirmed.       Follow Up Recommendations Home health PT;Supervision for mobility/OOB    Equipment Recommendations  Rolling walker with 5" wheels    Recommendations for Other Services    NA    Precautions / Restrictions Precautions Precautions: Back Precaution Booklet Issued: Yes (comment) Precaution Comments: back precaution handout given and reviewed Required Braces or Orthoses: Spinal Brace Spinal Brace: Lumbar corset;Applied in sitting position Restrictions Weight Bearing Restrictions: No      Mobility  Bed Mobility Overal bed mobility: Needs Assistance Bed Mobility: Rolling;Sidelying to Sit;Sit to Sidelying Rolling: Min assist Sidelying to sit: Min assist     Sit to sidelying: Min assist General bed mobility comments: Min assist to help progress to side lying and support trunk to come to sitting EOB, assist to return to sidelying by helping to raise legs into the bed.  Verbal cues for log roll and reverse log roll technique.   Transfers Overall transfer level: Needs assistance Equipment used: Rolling walker (2 wheeled) Transfers: Sit to/from Stand Sit to Stand: Min assist         General transfer comment: Min assist to support trunk during transitions.     Ambulation/Gait Ambulation/Gait assistance: Min assist Gait Distance (Feet): 3 Feet Assistive device: Rolling walker (2 wheeled) Gait Pattern/deviations: Step-to pattern     General Gait Details: 2-3 side steps up to [facility] to reposition in the bed.          Balance Overall balance assessment: Needs assistance Sitting-balance support: Feet supported;No upper extremity supported Sitting balance-Leahy Scale: Good     Standing balance support: Bilateral upper extremity supported Standing balance-Leahy Scale: Poor Standing balance comment: needs support from RW                             Pertinent Vitals/Pain Pain Assessment: Faces Faces Pain Scale: Hurts even more Pain Location: low back/incisional Pain Descriptors / Indicators: Aching;Burning Pain Intervention(s): Limited activity within patient's tolerance;Monitored during session;Repositioned    Home Living Family/patient expects to be discharged to:: Private residence Living Arrangements: Spouse/significant other Available Help at Discharge: Family Type of Home: House Home Access: Stairs to enter Entrance Stairs-Rails: Right Entrance Stairs-Number of Steps: 3 Home Layout: One level Home Equipment: Cane - single point      Prior Function Level of Independence: Independent         Comments: on disability, does not work.  Was having to use the electric scooters at the store to do shopping.      Hand Dominance   Dominant Hand: Right    Extremity/Trunk Assessment   Upper Extremity Assessment Upper Extremity Assessment: Overall WFL for tasks assessed    Lower Extremity Assessment Lower Extremity Assessment: Overall [facility]  for tasks assessed    Cervical / Trunk Assessment Cervical / Trunk Assessment: Other exceptions Cervical / Trunk Exceptions: post op lumbar surgery  Communication   Communication: No difficulties  Cognition Arousal/Alertness: Awake/alert Behavior During Therapy: WFL for  tasks assessed/performed Overall Cognitive Status: Within Functional Limits for tasks assessed                                        General Comments General comments (skin integrity, edema, etc.): We did not walk any significant distance or get OOB to the chair as pt did not have her spinal brace yet.         Assessment/Plan    PT Assessment Patient needs continued PT services  PT Problem List Decreased strength;Decreased activity tolerance;Decreased balance;Decreased mobility;Decreased knowledge of use of DME;Pain;Decreased knowledge of precautions;Obesity       PT Treatment Interventions DME instruction;Gait training;Stair training;Functional mobility training;Therapeutic activities;Therapeutic exercise;Balance training;Patient/family education;Modalities    PT Goals (Current goals can be found in the Care Plan section)  Acute Rehab PT Goals Patient Stated Goal: to stop having her back lock up on her. PT Goal Formulation: With patient Time For Goal Achievement: 06/04/18 Potential to Achieve Goals: Good    Frequency Min 5X/week           AM-PAC PT "6 Clicks" Daily Activity  Outcome Measure Difficulty turning over in bed (including adjusting bedclothes, sheets and blankets)?: Unable Difficulty moving from lying on back to sitting on the side of the bed? : Unable Difficulty sitting down on and standing up from a chair with arms (e.g., wheelchair, bedside commode, etc,.)?: Unable Help needed moving to and from a bed to chair (including a wheelchair)?: A Little Help needed walking in hospital room?: A Little Help needed climbing 3-5 steps with a railing? : A Lot 6 Click Score: 11    End of Session   Activity Tolerance: Patient tolerated treatment well Patient left: in bed;with call bell/phone within reach Nurse Communication: Mobility status PT Visit Diagnosis: Difficulty in walking, not elsewhere classified (R26.2);Pain Pain - Right/Left:  (incisional) Pain - part of body: (back)    Time: 3893-7342 PT Time Calculation (min) (ACUTE ONLY): 27 min   Charges:          Lurena Joiner B. Elexis Pollak, PT, DPT  Acute Rehabilitation #[phone] pager #[phone] office   PT Evaluation $PT Eval Moderate Complexity: 1 Mod PT Treatments $Therapeutic Activity: 8-22 mins        05/28/2018, 1:04 PM

## 2018-05-28 NOTE — Progress Notes (Signed)
   05/28/18 1130  Clinical Encounter Type  Visited With Patient  Visit Type Spiritual support  Referral From Nurse  Spiritual Encounters  Spiritual Needs Prayer  Stress Factors  Patient Stress Factors Major life changes;Health changes;Exhausted  Patient requested prayer, and chaplain responded to call. Chaplain offered a ministry of presence, and offered prayer.  Rev. Lynnell Chad

## 2018-05-28 NOTE — Progress Notes (Signed)
Subjective: Doing well.  preop left leg pain gone.  No complaints.    Objective: Vital signs in last 24 hours: Temp:  [97.2 F (36.2 C)-98.4 F (36.9 C)] 98.4 F (36.9 C) (09/11 0400) Pulse Rate:  [67-95] 86 (09/11 0853) Resp:  [5-21] 16 (09/11 0853) BP: (123-165)/(67-116) 133/71 (09/11 0400) SpO2:  [93 %-100 %] 93 % (09/11 0853) Arterial Line BP: (147-174)/(83-110) 166/92 (09/10 1830) Weight:  [130 kg] 130 kg (09/11 0635)  Intake/Output from previous day: 09/10 0701 - 09/11 0700 In: 3357.7 [P.O.:240; I.V.:2817.7; Blood:200; IV Piggyback:100] Out: 1900 [Urine:1400; Blood:500] Intake/Output this shift: No intake/output data recorded.  Recent Labs    05/28/18 0355  HGB 9.1*   Recent Labs    05/28/18 0355  WBC 12.2*  RBC 3.46*  HCT 29.3*  PLT 253   Recent Labs    05/28/18 0355  NA 139  K 3.8  CL 102  CO2 27  BUN 8  CREATININE 1.11*  GLUCOSE 119*  CALCIUM 8.9   No results for input(s): LABPT, INR in the last 72 hours.  Exam: Alert and oriented.  NAD.  Wound looks good.  Staples intact.  No drainage.  Dressing changed. bilat calves nontender. NVI.  No focal motor deficits.       Assessment/Plan: Up with PT.  Needs brace.  Anticipate home 2-3 days if makes good progress.     [name] 05/28/2018, 9:02 AM

## 2018-05-28 NOTE — Progress Notes (Signed)
RN handoff received from off going RN Rob.

## 2018-05-28 NOTE — Care Management Note (Signed)
Case Management Note  Patient Details  Name: Marilyn [name] MRN: [id number] Date of Birth: 1970/11/16  Subjective/Objective:                    Action/Plan:  Discussed discharge planning with patient at bedside. Patient has 3 in 1 at home already. Needs walker same ordered Reggie with [facility].   Referral for HHPT given to [facility] per patient . Expected Discharge Date:                  Expected Discharge Plan:  Home w Home Health Services  In-House Referral:     Discharge planning Services  CM Consult  Post Acute Care Choice:  Durable Medical Equipment, Home Health Choice offered to:  Patient  DME Arranged:  Walker rolling DME Agency:  [facility]  HH Arranged:  PT HH Agency:  [facility]  Status of Service:  Completed, signed off  If discussed at Long Length of Stay Meetings, dates discussed:    Additional Comments:  Kingsley Plan, RN 05/28/2018, 11:09 AM

## 2018-05-28 NOTE — Progress Notes (Signed)
Return call noted from Dr. Vira Browns. Per this conversation, RN given verbal to pull foley. RN inquired about patient right EJ. Per MD RN advised to leave EJ as patient is a difficult stick. Patient updated on current POC. Foley will be discontinued once patient is back in bed.

## 2018-05-28 NOTE — Progress Notes (Signed)
RN placed a call to covering provider (Dr. Alfonso Patten) with [place] [phone] to discuss foley catheter removal. RN's contact information provided, awaiting return call.

## 2018-05-28 NOTE — Progress Notes (Signed)
05/28/2018 Pt's brace was delivered and PT came back to walk with pt into the hallway which she did well with minimal support and use of the RW.  She reports left leg pain has significantly diminished and is hopeful that is a good sign that her back surgery was successful.  PT will continue to follow acutely to reinforce education and progress safe mobility. Rollene Rotunda Ryott Rafferty, PT, DPT  Acute Rehabilitation [phone] pager [phone] office      05/28/18 1309  PT Visit Information  Last PT Received On 05/28/18  Assistance Needed +1  History of Present Illness 47 y.o. female admitted on 05/27/18 for elective TLIF L4-5, L5-S1.  Pt with other significant PMH of bil TKA and L THA, HTN, HA, dizziness, asthma, anxiety, anemia, L RTC repair, R shoulder ligament repair.   Subjective Data  Patient Stated Goal to stop having her back lock up on her.  Precautions  Precautions Back  Precaution Booklet Issued Yes (comment)  Precaution Comments back precaution handout given and reviewed  Required Braces or Orthoses Spinal Brace  Spinal Brace Lumbar corset;Applied in sitting position  Pain Assessment  Pain Assessment Faces  Faces Pain Scale 4  Pain Location low back/incisional  Pain Descriptors / Indicators Aching;Burning  Pain Intervention(s) Limited activity within patient's tolerance;Monitored during session;Repositioned  Cognition  Arousal/Alertness Awake/alert  Behavior During Therapy WFL for tasks assessed/performed  Overall Cognitive Status Within Functional Limits for tasks assessed  Bed Mobility  General bed mobility comments Pt seated EOB when PT entered the room.  Brace already donned  Transfers  Overall transfer level Needs assistance  Equipment used Rolling walker (2 wheeled)  Transfers Sit to/from Stand  Sit to Stand Min assist  General transfer comment Min assist to support trunk during transitions.    Ambulation/Gait  Ambulation/Gait assistance Min assist  Gait  Distance (Feet) 85 Feet  Assistive device Rolling walker (2 wheeled)  Gait Pattern/deviations Step-through pattern  General Gait Details Verbal cues for upright posture, slow, cautious gait pattern, but stable, no signs of instability at the legs or foot drag.  Pt reports significant improvement in L LE pain with gait.   Balance  Overall balance assessment Needs assistance  Sitting-balance support Feet supported;No upper extremity supported  Sitting balance-Leahy Scale Good  Standing balance support Bilateral upper extremity supported  Standing balance-Leahy Scale Poor  Standing balance comment needs support from RW  PT - End of Session  Equipment Utilized During Treatment Back brace  Activity Tolerance Patient tolerated treatment well  Patient left in bed;with call bell/phone within reach  Nurse Communication Mobility status   PT - Assessment/Plan  PT Plan Current plan remains appropriate  PT Visit Diagnosis Difficulty in walking, not elsewhere classified (R26.2);Pain  Pain - Right/Left  (incisional)  Pain - part of body  (back)  PT Frequency (ACUTE ONLY) Min 5X/week  Follow Up Recommendations Home health PT;Supervision for mobility/OOB  PT equipment Rolling walker with 5" wheels  AM-PAC PT "6 Clicks" Daily Activity Outcome Measure  Difficulty turning over in bed (including adjusting bedclothes, sheets and blankets)? 1  Difficulty moving from lying on back to sitting on the side of the bed?  1  Difficulty sitting down on and standing up from a chair with arms (e.g., wheelchair, bedside commode, etc,.)? 1  Help needed moving to and from a bed to chair (including a wheelchair)? 3  Help needed walking in hospital room? 3  Help needed climbing 3-5 steps with a railing?  2  6 Click Score 11  Mobility G Code  CL  PT Goal Progression  Progress towards PT goals Progressing toward goals  PT Time Calculation  PT Start Time (ACUTE ONLY) 1121  PT Stop Time (ACUTE ONLY) 1136  PT Time  Calculation (min) (ACUTE ONLY) 15 min  PT General Charges  $$ ACUTE PT VISIT 1 Visit  PT Treatments  $Gait Training 8-22 mins

## 2018-05-28 NOTE — Evaluation (Signed)
Occupational Therapy Evaluation Patient Details Name: Marilyn [name] MRN: [id number] DOB: 1970/10/22 Today's Date: 05/28/2018    History of Present Illness 47 y.o. female admitted on 05/27/18 for elective TLIF L4-5, L5-S1.  Pt with other significant PMH of bil TKA and L THA, HTN, HA, dizziness, asthma, anxiety, anemia, L RTC repair, R shoulder ligament repair.    Clinical Impression   Pt was independent with ADLs PTA and planning for her mother to stay at dc. Pt currently requiring Min-Max A for LB ADLs and Min Guard-Min A for functional mobility with RW. Providing education on back precautions, brace management, LB ADLs, and toileting. Pt will need further OT to address LB ADLs with AE and tub transfers. Recommend dc home once medically stable per physician.     Follow Up Recommendations  No OT follow up;Supervision/Assistance - 24 hour    Equipment Recommendations  None recommended by OT    Recommendations for Other Services       Precautions / Restrictions Precautions Precautions: Back Precaution Booklet Issued: Yes (comment) Precaution Comments: back precaution handout given and reviewed Required Braces or Orthoses: Spinal Brace Spinal Brace: Lumbar corset;Applied in sitting position Restrictions Weight Bearing Restrictions: No      Mobility Bed Mobility Overal bed mobility: Needs Assistance Bed Mobility: Rolling;Sidelying to Sit;Sit to Sidelying Rolling: Min assist Sidelying to sit: Min assist     Sit to sidelying: Min assist General bed mobility comments: OOB upone arrival  Transfers Overall transfer level: Needs assistance Equipment used: Rolling walker (2 wheeled) Transfers: Sit to/from Stand Sit to Stand: Min guard;Min assist         General transfer comment: Min Guard A for sit>stand and then Min A for safe descent    Balance Overall balance assessment: Needs assistance Sitting-balance support: Feet supported;No upper extremity supported Sitting  balance-Leahy Scale: Good     Standing balance support: Bilateral upper extremity supported Standing balance-Leahy Scale: Poor Standing balance comment: needs support from RW                           ADL either performed or assessed with clinical judgement   ADL Overall ADL's : Needs assistance/impaired Eating/Feeding: Set up;Supervision/ safety;Sitting   Grooming: Standing;Min guard;Oral care Grooming Details (indicate cue type and reason): Educating pt on compensatory techniques. Upper Body Bathing: Set up;Supervision/ safety;Sitting Upper Body Bathing Details (indicate cue type and reason): Pt bathing UB at sink while seated Lower Body Bathing: Minimal assistance;Sit to/from stand Lower Body Bathing Details (indicate cue type and reason): Pt performing LB bathing with Min A for sit<>stand. Min tactile cues for no twisting. Pt demonstrating good adhereance to precautions Upper Body Dressing : Set up;Supervision/safety;Sitting Upper Body Dressing Details (indicate cue type and reason): Donning/doffing gown. Pt managing brace with supervision demosntrating understanding Lower Body Dressing: Maximal assistance;Sit to/from stand Lower Body Dressing Details (indicate cue type and reason): Due to increased body habitus, pt unable to bring ankles to knees. pt requiring further education on AE Toilet Transfer: Minimal assistance;Ambulation;RW(Simualted to chair) Toilet Transfer Details (indicate cue type and reason): Min A for safe descent   Toileting - Clothing Manipulation Details (indicate cue type and reason): Educating toilet hygiene techniques and pt practicing during bathing     Functional mobility during ADLs: Min guard;Rolling walker General ADL Comments: Pt presenting with decreased ROM for LB ADLs. Pt highly motivated to return to PLOF and home.      Vision  Perception     Praxis      Pertinent Vitals/Pain Pain Assessment: Faces Faces Pain Scale:  Hurts little more Pain Location: low back/incisional Pain Descriptors / Indicators: Aching;Burning Pain Intervention(s): Monitored during session;Repositioned     Hand Dominance Right   Extremity/Trunk Assessment Upper Extremity Assessment Upper Extremity Assessment: Overall WFL for tasks assessed   Lower Extremity Assessment Lower Extremity Assessment: Overall WFL for tasks assessed   Cervical / Trunk Assessment Cervical / Trunk Assessment: Other exceptions Cervical / Trunk Exceptions: post op lumbar surgery   Communication Communication Communication: No difficulties   Cognition Arousal/Alertness: Awake/alert Behavior During Therapy: WFL for tasks assessed/performed Overall Cognitive Status: Within Functional Limits for tasks assessed                                     General Comments  VSS    Exercises     Shoulder Instructions      Home Living Family/patient expects to be discharged to:: Private residence Living Arrangements: Parent(Mother) Available Help at Discharge: Family;Available 24 hours/day Type of Home: House Home Access: Stairs to enter [employer] of Steps: 3 Entrance Stairs-Rails: Right Home Layout: One level     Bathroom Shower/Tub: Tub/shower unit;Curtain   Firefighter: Standard     Home Equipment: Cane - single point;Bedside commode;Shower seat;Adaptive equipment Adaptive Equipment: Long-handled sponge        Prior Functioning/Environment Level of Independence: Independent        Comments: on disability, does not work.  Was having to use the electric scooters at the store to do shopping. (Use to work as a Lawyer)        OT Problem List: Decreased range of motion;Decreased activity tolerance;Impaired balance (sitting and/or standing);Decreased knowledge of use of DME or AE;Decreased knowledge of precautions;Pain      OT Treatment/Interventions: Self-care/ADL training;Therapeutic exercise;Energy  conservation;DME and/or AE instruction;Therapeutic activities;Patient/family education    OT Goals(Current goals can be found in the care plan section) Acute Rehab OT Goals Patient Stated Goal: to stop having her back lock up on her. OT Goal Formulation: With patient Time For Goal Achievement: 06/11/18 Potential to Achieve Goals: Good ADL Goals Pt Will Perform Lower Body Dressing: with set-up;with supervision;with adaptive equipment;sit to/from stand Pt Will Transfer to Toilet: with set-up;with supervision;regular height toilet;ambulating Pt Will Perform Toileting - Clothing Manipulation and hygiene: with set-up;with supervision;sit to/from stand;sitting/lateral leans Pt Will Perform Tub/Shower Transfer: Tub transfer;with set-up;with supervision;ambulating;shower seat;rolling walker  OT Frequency: Min 3X/week   Barriers to D/C:            Co-evaluation              AM-PAC PT "6 Clicks" Daily Activity     Outcome Measure Help from another person eating meals?: None Help from another person taking care of personal grooming?: None Help from another person toileting, which includes using toliet, bedpan, or urinal?: A Little Help from another person bathing (including washing, rinsing, drying)?: A Little Help from another person to put on and taking off regular upper body clothing?: None Help from another person to put on and taking off regular lower body clothing?: A Little 6 Click Score: 21   End of Session Equipment Utilized During Treatment: Back brace;Rolling walker Nurse Communication: Mobility status;Precautions  Activity Tolerance: Patient tolerated treatment well Patient left: in chair;with call bell/phone within reach;with nursing/sitter in room  OT Visit Diagnosis: Unsteadiness on feet (  R26.81);Other abnormalities of gait and mobility (R26.89);Muscle weakness (generalized) (M62.81);Pain Pain - part of body: (Back)                Time: 1610-9604 OT Time Calculation  (min): 31 min Charges:  OT General Charges $OT Visit: 1 Visit OT Evaluation $OT Eval Moderate Complexity: 1 Mod OT Treatments $Self Care/Home Management : 8-22 mins  Jaci Desanto MSOT, OTR/L Acute Rehab Pager: [phone] Office: [phone]  Theodoro Grist Lynell Greenhouse 05/28/2018, 3:49 PM

## 2018-05-28 NOTE — Progress Notes (Signed)
Nutrition Brief Note  Patient identified on the Malnutrition Screening Tool (MST) Report  Wt Readings from Last 15 Encounters:  05/28/18 130 kg  05/23/18 131.3 kg  04/10/18 132.5 kg  03/03/18 136.1 kg  03/02/18 (!) 149.7 kg  02/21/18 (!) 149.7 kg  01/29/18 (!) 149.7 kg  12/11/17 (!) 146.1 kg  11/30/16 (!) 146.1 kg  08/27/16 (!) 146.1 kg  07/18/16 (!) 146.3 kg  12/11/15 134.7 kg  11/10/15 133.8 kg  10/06/15 133.8 kg  09/29/15 131.5 kg    Body mass index is 50.77 kg/m. Patient meets criteria for morbid obesity based on current BMI. Pt with a 13% weight loss in 3 months. Pt reports weight loss related to an acute stomach virus in June. Pt reports appetite currently is fine with usual intake of at least 2-3 meals a day with no other difficulties.   Current diet order is clear liquids, patient is consuming approximately 100% of meals at this time. Pt hopeful diet will be advance soon as pt has been tolerating her food items at meals. Labs and medications reviewed.   No nutrition interventions warranted at this time. If nutrition issues arise, please consult RD.   Roslyn Smiling, MS, RD, LDN Pager # [phone] After hours/ weekend pager # [phone]

## 2018-05-29 LAB — BASIC METABOLIC PANEL
Anion gap: 7 (ref 5–15)
BUN: 11 mg/dL (ref 6–20)
CHLORIDE: 100 mmol/L (ref 98–111)
CO2: 29 mmol/L (ref 22–32)
CREATININE: 1.14 mg/dL — AB (ref 0.44–1.00)
Calcium: 8.2 mg/dL — ABNORMAL LOW (ref 8.9–10.3)
GFR calc Af Amer: >60 mL/min (ref >60)
GFR calc non Af Amer: 56 mL/min — ABNORMAL LOW (ref >60)
GLUCOSE: 111 mg/dL — AB (ref 70–99)
POTASSIUM: 3.6 mmol/L (ref 3.5–5.1)
SODIUM: 136 mmol/L (ref 135–145)

## 2018-05-29 LAB — CBC WITH DIFFERENTIAL/PLATELET
Abs Immature Granulocytes: 0 10*3/uL (ref 0.0–0.1)
Abs Immature Granulocytes: 0.1 10*3/uL (ref 0.0–0.1)
BASOS ABS: 0 10*3/uL (ref 0.0–0.1)
Basophils Absolute: 0 10*3/uL (ref 0.0–0.1)
Basophils Relative: 0 %
Basophils Relative: 0 %
EOS PCT: 1 %
EOS PCT: 3 %
Eosinophils Absolute: 0.1 10*3/uL (ref 0.0–0.7)
Eosinophils Absolute: 0.3 10*3/uL (ref 0.0–0.7)
HCT: 26.1 % — ABNORMAL LOW (ref 36.0–46.0)
HEMATOCRIT: 31.6 % — AB (ref 36.0–46.0)
HEMOGLOBIN: 9.5 g/dL — AB (ref 12.0–15.0)
Hemoglobin: 8 g/dL — ABNORMAL LOW (ref 12.0–15.0)
Immature Granulocytes: 1 %
Immature Granulocytes: 1 %
LYMPHS ABS: 2.8 10*3/uL (ref 0.7–4.0)
LYMPHS PCT: 29 %
Lymphocytes Relative: 28 %
Lymphs Abs: 2.4 10*3/uL (ref 0.7–4.0)
MCH: 26.4 pg (ref 26.0–34.0)
MCH: 26.3 pg (ref 26.0–34.0)
MCHC: 30.7 g/dL (ref 30.0–36.0)
MCHC: 30.1 g/dL (ref 30.0–36.0)
MCV: 86.1 fL (ref 78.0–100.0)
MCV: 87.5 fL (ref 78.0–100.0)
MONO ABS: 0.6 10*3/uL (ref 0.1–1.0)
MONOS PCT: 6 %
Monocytes Absolute: 0.5 10*3/uL (ref 0.1–1.0)
Monocytes Relative: 6 %
Neutro Abs: 5.3 10*3/uL (ref 1.7–7.7)
Neutro Abs: 6.1 10*3/uL (ref 1.7–7.7)
Neutrophils Relative %: 64 %
Neutrophils Relative %: 61 %
Platelets: 221 10*3/uL (ref 150–400)
Platelets: 257 10*3/uL (ref 150–400)
RBC: 3.03 MIL/uL — ABNORMAL LOW (ref 3.87–5.11)
RBC: 3.61 MIL/uL — AB (ref 3.87–5.11)
RDW: 14.7 % (ref 11.5–15.5)
RDW: 14.6 % (ref 11.5–15.5)
WBC: 8.3 10*3/uL (ref 4.0–10.5)
WBC: 9.8 10*3/uL (ref 4.0–10.5)

## 2018-05-29 MED ORDER — FERROUS GLUCONATE 324 (38 FE) MG PO TABS
324.0000 mg | ORAL_TABLET | Freq: Three times a day (TID) | ORAL | Status: DC
  Administered 2018-05-29 – 2018-05-30 (×5): 324 mg via ORAL
  Filled 2018-05-29 (×7): qty 1

## 2018-05-29 MED FILL — Heparin Sodium (Porcine) Inj 1000 Unit/ML: INTRAMUSCULAR | Qty: 60 | Status: AC

## 2018-05-29 MED FILL — Sodium Chloride IV Soln 0.9%: INTRAVENOUS | Qty: 2000 | Status: AC

## 2018-05-29 NOTE — Progress Notes (Signed)
     Subjective: 2 Days Post-Op Procedure(s) (LRB): TRANSFORAMINAL LUMBAR INTERBODY FUSION LEFT L4-5 AND L5-S1 WITH DEPUY MPACT SCREWS AND RODS, CONCORDE PROTI CAGES, LOCAL AND ALLOGRAFT BONE GRAFT, VIVIGEN (N/A) Awake, alert and oriented x4. Using her CPAP with her home mask no problems last evening. Marilyn KiefJames [name] changed dressing yesterday. Tolerating po, flatus, no BM. Walking with assistance with walker.   Patient reports pain as moderate.    Objective:   VITALS:  Temp:  [97.5 F (36.4 C)-98.4 F (36.9 C)] 98.4 F (36.9 C) (09/12 0818) Pulse Rate:  [76-87] 76 (09/12 0818) Resp:  [16-18] 18 (09/11 1449) BP: (87-142)/(45-68) 107/54 (09/12 0818) SpO2:  [93 %-100 %] 93 % (09/12 0818)  Neurologically intact ABD soft Neurovascular intact Sensation intact distally Intact pulses distally Dorsiflexion/Plantar flexion intact Incision: dressing C/D/I, no drainage and New dressing as the old has rolled up and is not covering incision.   LABS Recent Labs    05/28/18 0355 05/29/18 0309  HGB 9.1* 8.0*  WBC 12.2* 8.3  PLT 253 221   Recent Labs    05/28/18 0355 05/29/18 0309  NA 139 136  K 3.8 3.6  CL 102 100  CO2 27 29  BUN 8 11  CREATININE 1.11* 1.14*  GLUCOSE 119* 111*   No results for input(s): LABPT, INR in the last 72 hours.   Assessment/Plan: 2 Days Post-Op Procedure(s) (LRB): TRANSFORAMINAL LUMBAR INTERBODY FUSION LEFT L4-5 AND L5-S1 WITH DEPUY MPACT SCREWS AND RODS, CONCORDE PROTI CAGES, LOCAL AND ALLOGRAFT BONE GRAFT, VIVIGEN (N/A)  Anemia blood loss from  Surgery.   Advance diet Up with therapy Plan for discharge tomorrow Recheck H/H this evening in hopes of being able to discontinue EJ line. Starte Ferrous Gluconate. Vira BrownsJames Parmvir [name] 05/29/2018, 8:28 AMPatient ID: Marilyn [name], female   DOB: August 10, 1971, 47 y.o.   MRN: [id number]

## 2018-05-29 NOTE — Progress Notes (Signed)
Occupational Therapy Treatment Patient Details Name: Marilyn [name] MRN: [id number] DOB: December 04, 1970 Today's Date: 05/29/2018    History of present illness 47 y.o. female admitted on 05/27/18 for elective TLIF L4-5, L5-S1.  Pt with other significant PMH of bil TKA and L THA, HTN, HA, dizziness, asthma, anxiety, anemia, L RTC repair, R shoulder ligament repair.    OT comments  Pt making progress towards OT goals this session. Focused on AE education and practice with reacher/grabber, sock donner, toilet aide, long handle shoe horn and toilet aide. Pt eager for movement and education and is SUCH a pleasure to work with. She has the most positive attitude and her progress towards therapy goals is indicative of her motivation. OT will continue to follow in the acute setting.    Follow Up Recommendations  No OT follow up;Supervision/Assistance - 24 hour    Equipment Recommendations  Other (comment)(AE for LB ADL)    Recommendations for Other Services      Precautions / Restrictions Precautions Precautions: Back Precaution Booklet Issued: Yes (comment) Precaution Comments: Pt able to report 2/3 back precautions.  Required Braces or Orthoses: Spinal Brace Spinal Brace: Lumbar corset;Applied in sitting position       Mobility Bed Mobility               General bed mobility comments: Pt was OOB in the recliner chair.   Transfers Overall transfer level: Needs assistance Equipment used: Rolling walker (2 wheeled) Transfers: Sit to/from Stand Sit to Stand: Min guard         General transfer comment: Min guard assist for safety during transitions, pt demonstrated safe hand placement.     Balance Overall balance assessment: Needs assistance Sitting-balance support: No upper extremity supported;Feet supported Sitting balance-Leahy Scale: Good     Standing balance support: Bilateral upper extremity supported;No upper extremity supported Standing balance-Leahy Scale: Fair                              ADL either performed or assessed with clinical judgement   ADL Overall ADL's : Needs assistance/impaired           Upper Body Bathing Details (indicate cue type and reason): educated in long handle sponge Lower Body Bathing: Supervison/ safety;Sit to/from stand Lower Body Bathing Details (indicate cue type and reason): educated in long handle sponge Upper Body Dressing : Set up;Supervision/safety;Sitting Upper Body Dressing Details (indicate cue type and reason): Donning/doffing gown. Pt managing brace with supervision demosntrating understanding Lower Body Dressing: Min guard;With adaptive equipment;Sit to/from stand Lower Body Dressing Details (indicate cue type and reason): educated in Water quality scientist; sock donner, long handle shoe horn       Toileting - Clothing Manipulation Details (indicate cue type and reason): educated in toilet aide for peri care             Vision       Perception     Praxis      Cognition Arousal/Alertness: Awake/alert Behavior During Therapy: WFL for tasks assessed/performed Overall Cognitive Status: Within Functional Limits for tasks assessed                                          Exercises     Shoulder Instructions       General Comments Pt would like to practice going up a ramp  tomorrow in preparation of going home.     Pertinent Vitals/ Pain       Pain Assessment: Faces Faces Pain Scale: Hurts little more Pain Location: low back/incisional Pain Descriptors / Indicators: Aching;Burning Pain Intervention(s): Monitored during session;Repositioned  Home Living                                          Prior Functioning/Environment              Frequency  Min 3X/week        Progress Toward Goals  OT Goals(current goals can now be found in the care plan section)  Progress towards OT goals: Progressing toward goals  Acute Rehab OT  Goals Patient Stated Goal: to be able to go shopping and not need the mechanical cart OT Goal Formulation: With patient Time For Goal Achievement: 06/11/18 Potential to Achieve Goals: Good  Plan Discharge plan remains appropriate;Frequency remains appropriate    Co-evaluation                 AM-PAC PT "6 Clicks" Daily Activity     Outcome Measure   Help from another person eating meals?: None Help from another person taking care of personal grooming?: None Help from another person toileting, which includes using toliet, bedpan, or urinal?: A Little Help from another person bathing (including washing, rinsing, drying)?: A Little Help from another person to put on and taking off regular upper body clothing?: None Help from another person to put on and taking off regular lower body clothing?: A Little 6 Click Score: 21    End of Session Equipment Utilized During Treatment: Back brace;Rolling walker  OT Visit Diagnosis: Unsteadiness on feet (R26.81);Other abnormalities of gait and mobility (R26.89);Muscle weakness (generalized) (M62.81);Pain Pain - part of body: (back)   Activity Tolerance Patient tolerated treatment well   Patient Left in chair;with call bell/phone within reach   Nurse Communication Mobility status;Precautions        Time: [id number] OT Time Calculation (min): 25 min  Charges: OT General Charges $OT Visit: 1 Visit OT Treatments $Self Care/Home Management : 23-37 mins  Sherryl MangesLaura Espn Zeman OTR/L Acute Rehabilitation Services Pager: [phone] Office: [phone]   Evern BioLaura J Caliope Ruppert 05/29/2018, 4:24 PM

## 2018-05-29 NOTE — Progress Notes (Signed)
Physical Therapy Treatment Patient Details Name: Marilyn [name] MRN: [id number] DOB: 08-16-1971 Today's Date: 05/29/2018    History of Present Illness 47 y.o. female admitted on 05/27/18 for elective TLIF L4-5, L5-S1.  Pt with other significant PMH of bil TKA and L THA, HTN, HA, dizziness, asthma, anxiety, anemia, L RTC repair, R shoulder ligament repair.     PT Comments    Pt is progressing well with gait and mobility.  She reports her father is building a ramp at her house today and is wanting to practice walking up a ramp prior to possible d/c tomorrow.  We can do this in the 6N long hallway or take her down to inpatient rehab that has a built in ramp there.   Follow Up Recommendations  Home health PT;Supervision for mobility/OOB     Equipment Recommendations  Rolling walker with 5" wheels    Recommendations for Other Services   NA     Precautions / Restrictions Precautions Precautions: Back Precaution Booklet Issued: Yes (comment) Precaution Comments: Pt able to report 2/3 back precautions.  Required Braces or Orthoses: Spinal Brace Spinal Brace: Lumbar corset;Applied in sitting position Restrictions Weight Bearing Restrictions: No    Mobility  Bed Mobility               General bed mobility comments: Pt was OOB in the recliner chair.   Transfers Overall transfer level: Needs assistance Equipment used: Rolling walker (2 wheeled) Transfers: Sit to/from Stand Sit to Stand: Min guard         General transfer comment: Min guard assist for safety during transitions, pt demonstrated safe hand placement.   Ambulation/Gait Ambulation/Gait assistance: Min guard;Supervision Gait Distance (Feet): 200 Feet Assistive device: Rolling walker (2 wheeled) Gait Pattern/deviations: Step-through pattern Gait velocity: decreased Gait velocity interpretation: 1.31 - 2.62 ft/sec, indicative of limited community ambulator General Gait Details: Pt with good upright posture  during gait despite RW use, reported LEs still felt good.  We talked about a home walking program.    Stairs Stairs: (pt reports her father is building a ramp today for her )                  Balance Overall balance assessment: Needs assistance Sitting-balance support: No upper extremity supported;Feet supported Sitting balance-Leahy Scale: Good     Standing balance support: Bilateral upper extremity supported;No upper extremity supported Standing balance-Leahy Scale: Fair                              Cognition Arousal/Alertness: Awake/alert Behavior During Therapy: WFL for tasks assessed/performed Overall Cognitive Status: Within Functional Limits for tasks assessed                                           General Comments General comments (skin integrity, edema, etc.): Pt would like to practice going up a ramp tomorrow in preparation of going home.       Pertinent Vitals/Pain Pain Assessment: Faces Faces Pain Scale: Hurts little more Pain Location: low back/incisional Pain Descriptors / Indicators: Aching;Burning Pain Intervention(s): Limited activity within patient's tolerance;Monitored during session;Repositioned           PT Goals (current goals can now be found in the care plan section) Acute Rehab PT Goals Patient Stated Goal: to stop having her back lock up on  her. Progress towards PT goals: Progressing toward goals    Frequency    Min 5X/week      PT Plan Current plan remains appropriate       AM-PAC PT "6 Clicks" Daily Activity  Outcome Measure  Difficulty turning over in bed (including adjusting bedclothes, sheets and blankets)?: Unable Difficulty moving from lying on back to sitting on the side of the bed? : Unable Difficulty sitting down on and standing up from a chair with arms (e.g., wheelchair, bedside commode, etc,.)?: Unable Help needed moving to and from a bed to chair (including a wheelchair)?: A  Little Help needed walking in hospital room?: A Little Help needed climbing 3-5 steps with a railing? : A Lot 6 Click Score: 11    End of Session Equipment Utilized During Treatment: Back brace Activity Tolerance: Patient tolerated treatment well Patient left: in chair;with call bell/phone within reach   PT Visit Diagnosis: Difficulty in walking, not elsewhere classified (R26.2);Pain Pain - Right/Left: (incisional) Pain - part of body: (back)     Time: 1212-1228 PT Time Calculation (min) (ACUTE ONLY): 16 min  Charges:  $Gait Training: 8-22 mins                    Guerry Covington B. Princess Karnes, PT, DPT  Acute Rehabilitation [phone] pager #[phone] office   05/29/2018, 3:30 PM

## 2018-05-30 LAB — HEMOGLOBIN AND HEMATOCRIT, BLOOD
HEMATOCRIT: 29.5 % — AB (ref 36.0–46.0)
Hemoglobin: 9 g/dL — ABNORMAL LOW (ref 12.0–15.0)

## 2018-05-30 MED ORDER — DOCUSATE SODIUM 100 MG PO CAPS: 100.0000 mg | ORAL_CAPSULE | Freq: Two times a day (BID) | ORAL | 0 refills | Status: DC

## 2018-05-30 MED ORDER — BISACODYL 10 MG RE SUPP
10.0000 mg | Freq: Once | RECTAL | Status: AC
  Administered 2018-05-30: 10 mg via RECTAL

## 2018-05-30 MED ORDER — FERROUS GLUCONATE 324 (38 FE) MG PO TABS: 324.0000 mg | ORAL_TABLET | Freq: Two times a day (BID) | ORAL | 1 refills | Status: DC

## 2018-05-30 MED ORDER — MAGNESIUM CITRATE PO SOLN
0.5000 | Freq: Once | ORAL | Status: AC
  Administered 2018-05-30: 0.5 via ORAL
  Filled 2018-05-30: qty 296

## 2018-05-30 MED ORDER — OXYCODONE HCL 5 MG PO TABS: 5.0000 mg | ORAL_TABLET | ORAL | 0 refills | Status: AC | PRN

## 2018-05-30 NOTE — Progress Notes (Signed)
IJ removed.  Pt bathed and dressed.  Pt discharged to home.

## 2018-05-30 NOTE — Progress Notes (Signed)
Physical Therapy Treatment Patient Details Name: Marilyn [name] MRN: [id number] DOB: Dec 18, 1970 Today's Date: 05/30/2018    History of Present Illness 47 y.o. female admitted on 05/27/18 for elective TLIF L4-5, L5-S1.  Pt with other significant PMH of bil TKA and L THA, HTN, HA, dizziness, asthma, anxiety, anemia, L RTC repair, R shoulder ligament repair.     PT Comments    Pt is progressing well with gait and mobility.  We were able to practice walking up a ramp on our inpatient rehab unit and walk part of that long distance back to her room.  She is happy to be d/c home later today.  PT to follow acutely until d/c confirmed.      Follow Up Recommendations  Home health PT;Supervision for mobility/OOB     Equipment Recommendations  Rolling walker with 5" wheels    Recommendations for Other Services   NA     Precautions / Restrictions Precautions Precautions: Back Required Braces or Orthoses: Spinal Brace Spinal Brace: Lumbar corset;Applied in sitting position    Mobility  Bed Mobility               General bed mobility comments: Pt was OOB in the recliner chair.   Transfers Overall transfer level: Needs assistance Equipment used: Rolling walker (2 wheeled) Transfers: Sit to/from Stand Sit to Stand: Supervision         General transfer comment: supervision for safety due to slow speed of transitions.  Ambulation/Gait Ambulation/Gait assistance: Supervision Gait Distance (Feet): 250 Feet Assistive device: Rolling walker (2 wheeled) Gait Pattern/deviations: Step-through pattern;Trunk flexed Gait velocity: decreased Gait velocity interpretation: 1.31 - 2.62 ft/sec, indicative of limited community ambulator General Gait Details: Pt with flexed trunk posture, cues for upright posture, praciteced a ramp on rehab and walked part of the way back with recliner to follow for when she got fatigued.  Her daughter went with [place] and was educated re: guarded her mom on the  ramp at home.           Balance Overall balance assessment: Needs assistance Sitting-balance support: Feet supported;No upper extremity supported Sitting balance-Leahy Scale: Good     Standing balance support: Bilateral upper extremity supported;No upper extremity supported;Single extremity supported Standing balance-Leahy Scale: Fair                              Cognition Arousal/Alertness: Awake/alert Behavior During Therapy: WFL for tasks assessed/performed Overall Cognitive Status: Within Functional Limits for tasks assessed                                               Pertinent Vitals/Pain Pain Assessment: Faces Faces Pain Scale: Hurts even more Pain Location: low back/incisional Pain Descriptors / Indicators: Aching;Burning Pain Intervention(s): Limited activity within patient's tolerance;Monitored during session;Repositioned       Prior Function            PT Goals (current goals can now be found in the care plan section) Acute Rehab PT Goals Patient Stated Goal: to be able to go shopping and not need the mechanical cart Progress towards PT goals: Progressing toward goals    Frequency    Min 5X/week      PT Plan Current plan remains appropriate       AM-PAC PT "6 Clicks" Daily  Activity  Outcome Measure  Difficulty turning over in bed (including adjusting bedclothes, sheets and blankets)?: Unable Difficulty moving from lying on back to sitting on the side of the bed? : Unable Difficulty sitting down on and standing up from a chair with arms (e.g., wheelchair, bedside commode, etc,.)?: Unable Help needed moving to and from a bed to chair (including a wheelchair)?: None Help needed walking in hospital room?: None Help needed climbing 3-5 steps with a railing? : A Little 6 Click Score: 14    End of Session Equipment Utilized During Treatment: Back brace Activity Tolerance: Patient limited by pain;Patient limited by  fatigue Patient left: in chair;with call bell/phone within reach;with family/visitor present   PT Visit Diagnosis: Difficulty in walking, not elsewhere classified (R26.2);Pain Pain - Right/Left: (incisional) Pain - part of body: (back)     Time: [id number] PT Time Calculation (min) (ACUTE ONLY): 20 min  Charges:  $Gait Training: 8-22 mins          Tabatha Razzano B. Audrina Marten, PT, DPT  Acute Rehabilitation [phone] pager #[phone] office             05/30/2018, 1:14 PM

## 2018-05-30 NOTE — Care Management Note (Signed)
Case Management Note Donn PieriniKristi Ambika Zettlemoyer RN, BSN Unit 4E- RN Care Coordinator -- Cross coverage for 4NP [phone]  Patient Details  Name: Marilyn [name] MRN: [id number] Date of Birth: Feb 17, 1971  Subjective/Objective:    Pt admitted s/p lumbar fusion                Action/Plan: PTA Pt lived at home, for transition home later today with daughter. Spoke with pt at bedside [facility]- referral has been sent per previous CM- [facility] HHPT arranged. Per pt she has 3n1 at home already, and [facility]. No further transition of care needs noted, pt denies any other barriers or concerns.   Expected Discharge Date:  05/30/18               Expected Discharge Plan:  Home w Home Health Services  In-House Referral:     Discharge planning Services  CM Consult  Post Acute Care Choice:  Durable Medical Equipment, Home Health Choice offered to:  Patient  DME Arranged:  Walker rolling DME Agency:  [facility]  [facility]  [facility]  Status of Service:  Completed, signed off  If discussed at Long Length of Stay Meetings, dates discussed:    Discharge Disposition: home/home health   Additional Comments:  Darrold SpanWebster, Shalaunda Weatherholtz Hall, RN 05/30/2018, 11:07 AM

## 2018-05-30 NOTE — Progress Notes (Signed)
Result in from lab draw, hemoglobin is 9.0

## 2018-05-30 NOTE — Progress Notes (Signed)
Patient ID: Marilyn [name], female   DOB: Jun 13, 1971, 47 y.o.   MRN: [id number] Hgb is 9.2 this past evening, expect to discontinue EJ line in AM. Probable discharge home this morning.

## 2018-05-30 NOTE — Progress Notes (Signed)
Subjective: Doing well.  Pain controlled.  Ambulating great in hall. Denies fatigue, lightheadedness.  Wants to go home.  Has not had a bowel movement.     Objective: Vital signs in last 24 hours: Temp:  [98.4 F (36.9 C)-99.7 F (37.6 C)] 99.5 F (37.5 C) (09/13 0750) Pulse Rate:  [76-95] 85 (09/13 0750) Resp:  [17] 17 (09/12 0851) BP: (107-121)/(52-85) 112/63 (09/13 0750) SpO2:  [93 %-100 %] 98 % (09/13 0750)  Intake/Output from previous day: 09/12 0701 - 09/13 0700 In: 480 [P.O.:480] Out: -  Intake/Output this shift: No intake/output data recorded.  Recent Labs    05/28/18 0355 05/29/18 0309 05/29/18 2231  HGB 9.1* 8.0* 9.5*   Recent Labs    05/29/18 0309 05/29/18 2231  WBC 8.3 9.8  RBC 3.03* 3.61*  HCT 26.1* 31.6*  PLT 221 257   Recent Labs    05/28/18 0355 05/29/18 0309  NA 139 136  K 3.8 3.6  CL 102 100  CO2 27 29  BUN 8 11  CREATININE 1.11* 1.14*  GLUCOSE 119* 111*  CALCIUM 8.9 8.2*   No results for input(s): LABPT, INR in the last 72 hours.  Exam: Alert and oriented. NAD.  Dressing C/D/I.  NVI.  No focal motor deficits.  bilat calves nontender.        Assessment/Plan: Will repeat H/H this AM.  Anticipate d/c home this afternoon.  Script for Oxy on chart.  Give mag citrate and dulcolax supp this morning.    Zonia KiefJames Poseidon Pam 05/30/2018, 8:17 AM

## 2018-05-30 NOTE — Care Management Important Message (Signed)
Important Message  Patient Details  Name: Marilyn [name] MRN: [id number] Date of Birth: 10/14/1970   Medicare Important Message Given:  Yes    Drezden Seitzinger Stefan ChurchBratton 05/30/2018, 3:49 PM

## 2018-06-08 ENCOUNTER — Emergency Department (HOSPITAL_COMMUNITY): Payer: Medicare Other

## 2018-06-08 ENCOUNTER — Other Ambulatory Visit: Payer: Self-pay

## 2018-06-08 ENCOUNTER — Emergency Department (HOSPITAL_COMMUNITY)
Admission: EM | Admit: 2018-06-08 | Discharge: 2018-06-08 | Disposition: A | Discharge status: Home / Self Care | Payer: Medicare Other | Source: Home / Self Care | Attending: Emergency Medicine | Admitting: Emergency Medicine

## 2018-06-08 ENCOUNTER — Encounter (HOSPITAL_COMMUNITY): Payer: Self-pay

## 2018-06-08 ENCOUNTER — Emergency Department (HOSPITAL_COMMUNITY)
Admit: 2018-06-08 | Discharge: 2018-06-08 | Disposition: A | Discharge status: Home / Self Care | Payer: Medicare Other | Attending: Emergency Medicine | Admitting: Emergency Medicine

## 2018-06-08 DIAGNOSIS — Z87891 Personal history of nicotine dependence: Secondary | ICD-10-CM | POA: Insufficient documentation

## 2018-06-08 DIAGNOSIS — R52 Pain, unspecified: Secondary | ICD-10-CM

## 2018-06-08 DIAGNOSIS — M25571 Pain in right ankle and joints of right foot: Secondary | ICD-10-CM | POA: Diagnosis not present

## 2018-06-08 DIAGNOSIS — I10 Essential (primary) hypertension: Secondary | ICD-10-CM | POA: Insufficient documentation

## 2018-06-08 DIAGNOSIS — Z96652 Presence of left artificial knee joint: Secondary | ICD-10-CM | POA: Insufficient documentation

## 2018-06-08 DIAGNOSIS — J45909 Unspecified asthma, uncomplicated: Secondary | ICD-10-CM | POA: Insufficient documentation

## 2018-06-08 DIAGNOSIS — Z91040 Latex allergy status: Secondary | ICD-10-CM | POA: Insufficient documentation

## 2018-06-08 DIAGNOSIS — T8142XA Infection following a procedure, deep incisional surgical site, initial encounter: Secondary | ICD-10-CM | POA: Diagnosis not present

## 2018-06-08 DIAGNOSIS — R609 Edema, unspecified: Secondary | ICD-10-CM

## 2018-06-08 DIAGNOSIS — M25572 Pain in left ankle and joints of left foot: Secondary | ICD-10-CM

## 2018-06-08 DIAGNOSIS — E039 Hypothyroidism, unspecified: Secondary | ICD-10-CM

## 2018-06-08 DIAGNOSIS — Z79899 Other long term (current) drug therapy: Secondary | ICD-10-CM

## 2018-06-08 MED ORDER — HYDROCODONE-ACETAMINOPHEN 5-325 MG PO TABS: 2.0000 | ORAL_TABLET | ORAL | 0 refills | Status: DC | PRN

## 2018-06-08 MED ORDER — ENOXAPARIN SODIUM 100 MG/ML ~~LOC~~ SOLN
100.0000 mg | Freq: Once | SUBCUTANEOUS | Status: AC
  Administered 2018-06-08: 100 mg via SUBCUTANEOUS
  Filled 2018-06-08: qty 1

## 2018-06-08 NOTE — Discharge Instructions (Addendum)
Return if any problems.

## 2018-06-08 NOTE — ED Triage Notes (Addendum)
Pt reports right ankle pain since last night. No injury. Sent from urgent care since pt was unable to get back into car due to pain. No fx per EMS Pt had back sx on 9/10

## 2018-06-08 NOTE — ED Provider Notes (Signed)
[facility] EMERGENCY DEPARTMENT Provider Note   CSN: [id number] Arrival date & time: 06/08/18  1225     History   Chief Complaint Chief Complaint  Patient presents with  . Ankle Pain    HPI Marilyn [name] is a 47 y.o. female.  The history is provided by the patient. No language interpreter was used.  Ankle Pain   The incident occurred yesterday. The incident occurred at home. There was no injury mechanism. The pain is present in the right ankle. The quality of the pain is described as aching. The pain is at a severity of 6/10. The pain is moderate. The pain has been constant since onset. Associated symptoms include inability to bear weight. Pertinent negatives include no numbness. She reports no foreign bodies present. She has tried nothing for the symptoms. The treatment provided no relief.   Pt reports she began having pain in her right ankle last pm.  Pt reports no injury.  Pt had back surgery 2 weeks ago.  Pt went to Urgent care today and was sent here by EMS for evaluation of possible DVT.  Pt reports pain is better after the medications she was given Past Medical History:  Diagnosis Date  . Anemia    taking iron now. pt states having no current issues 09/02/2015  . Anginal pain ([place])    pt states experiences chest wall pain pt states related to her asthma   . Anxiety    with MRI's  . Arthritis    Everywhere  . Asthma   . Dizziness   . GERD (gastroesophageal reflux disease)   . Headache(784.0)    HX OF MIGRAINES  . History of bronchitis   . Hypertension   . Hypothyroidism    takes levothyroxen  . OSA on CPAP   . Shortness of breath    with exertion  . Wears glasses     Patient Active Problem List   Diagnosis Date Noted  . Morbid (severe) obesity due to excess calories ([place]) 05/29/2018    Class: Chronic  . Spondylolisthesis, lumbar region 05/27/2018    Class: Chronic  . Spinal stenosis of lumbar region 05/27/2018    Class: Chronic  . Urinary tract  infection 05/27/2018    Class: Acute  . Fusion of spine of lumbar region 05/27/2018  . Pain of right hip joint 07/03/2017  . Acute right-sided low back pain with right-sided sciatica 03/27/2017  . Chronic right shoulder pain 02/13/2017  . History of rotator cuff tear 11/30/2016  . Impingement syndrome of right shoulder 11/30/2016  . Exacerbation of asthma 07/18/2016  . Hypokalemia 07/18/2016  . Hypertension 07/18/2016  . Depression with anxiety 07/18/2016  . Hypothyroidism 07/18/2016  . Hyperglycemia 07/18/2016  . Acute asthma exacerbation 07/18/2016  . Surgical wound dehiscence left hip; questionable superficial infection 11/10/2015  . Left hip postoperative wound infection 11/10/2015  . Osteoarthritis of left hip 09/09/2015  . 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 Obesity (BMI 35.0-39.9 without comorbidity) 04/28/2013    Past Surgical History:  Procedure Laterality Date  . CESAREAN SECTION     times 2  . CHOLECYSTECTOMY    . ENDOMETRIAL ABLATION    . INCISION AND DRAINAGE HIP Left 11/10/2015   Procedure: IRRIGATION AND DEBRIDEMENT LEFT HIP INCISION;  Surgeon: Mcarthur Rossetti, MD;  Location: [place];  Service: Orthopedics;  Laterality: Left;  . JOINT REPLACEMENT  2011   total left knee  . KNEE ARTHROPLASTY  04/23/2012  right   . KNEE ARTHROSCOPY    . ROTATOR CUFF REPAIR     left   . SHOULDER SURGERY     right to repair ligament tear  . TOTAL HIP ARTHROPLASTY Left 09/09/2015   Procedure: LEFT TOTAL HIP ARTHROPLASTY ANTERIOR APPROACH;  Surgeon: Mcarthur Rossetti, MD;  Location: [facility];  Service: Orthopedics;  Laterality: Left;  . TOTAL KNEE ARTHROPLASTY  04/23/2012   Procedure: TOTAL KNEE ARTHROPLASTY;  Surgeon: Newt Minion, MD;  Location: [place];  Service: Orthopedics;  Laterality: Right;  Right Total Knee Arthroplasty  . TUBAL LIGATION  1996     OB History    Gravida  2   Para  2   Term  2   Preterm      AB      Living  2     SAB        TAB      Ectopic      Multiple      Live Births               Home Medications    Prior to Admission medications   Medication Sig Start Date End Date Taking? Authorizing Provider  albuterol (PROVENTIL HFA;VENTOLIN HFA) 108 (90 BASE) MCG/ACT inhaler Inhale 2 puffs into the lungs every 6 (six) hours as needed for wheezing or shortness of breath.    [provider]  albuterol (PROVENTIL) (2.5 MG/3ML) 0.083% nebulizer solution Take 3 mLs (2.5 mg total) by nebulization 3 (three) times daily. Patient not taking: Reported on 05/01/2018 07/19/16   Thurnell Lose, MD  amoxicillin-clavulanate (AUGMENTIN) 500-125 MG tablet Take 1 tablet (500 mg total) by mouth 3 (three) times daily. 05/26/18   Jessy Oto, MD  buprenorphine (SUBUTEX) 8 MG SUBL SL tablet Place 8 mg under the tongue 2 (two) times daily.     [provider]  cetirizine (ZYRTEC) 10 MG tablet Take 10 mg by mouth daily as needed for allergies.     [provider]  cyclobenzaprine (FLEXERIL) 10 MG tablet TAKE 1 TABLET BY MOUTH THREE TIMES DAILY AS NEEDED FOR MUSCLE SPASM Patient taking differently: Take 10 mg by mouth 3 (three) times daily as needed for muscle spasms.  05/02/17   Mcarthur Rossetti, MD  diphenhydrAMINE (BENADRYL) 25 MG tablet Take 1 tablet (25 mg total) by mouth every 6 (six) hours. Patient taking differently: Take 25 mg by mouth every 6 (six) hours as needed for itching or allergies.  09/29/15   Jola Schmidt, MD  docusate sodium (COLACE) 100 MG capsule Take 1 capsule (100 mg total) by mouth 2 (two) times daily. 05/30/18   Jessy Oto, MD  ergocalciferol (VITAMIN D2) 50000 units capsule Take 50,000 Units by mouth every Monday.     [provider]  ferrous gluconate (FERGON) 324 MG tablet Take 1 tablet (324 mg total) by mouth 2 (two) times daily with a meal. 05/30/18   Jessy Oto, MD  furosemide (LASIX) 20 MG tablet Take 20 mg by mouth daily as needed for fluid.   11/01/17   [provider]  hydrochlorothiazide 25 MG tablet Take 25 mg by mouth daily.     [provider]  levothyroxine (SYNTHROID, LEVOTHROID) 137 MCG tablet Take 137 mcg by mouth daily before breakfast.  05/31/17   [provider]  metoCLOPramide (REGLAN) 5 MG tablet Take 5 mg by mouth 3 (three) times daily before meals.    [provider]  NARCAN 4 MG/0.1ML LIQD nasal spray kit Place 0.4 mg into the nose once.  06/28/17   [provider]  ondansetron (ZOFRAN ODT) 8 MG disintegrating tablet Take 1 tablet (8 mg total) by mouth every 8 (eight) hours as needed for nausea or vomiting. Patient not taking: Reported on 05/01/2018 03/03/18   Dorie Rank, MD  oxybutynin (DITROPAN-XL) 10 MG 24 hr tablet Take 10 mg by mouth daily.  05/31/17   [provider]  pantoprazole (PROTONIX) 20 MG tablet Take 1 tablet (20 mg total) by mouth 2 (two) times daily before a meal. Patient not taking: Reported on 05/01/2018 03/13/18   Virgel Manifold, MD  potassium chloride (K-DUR,KLOR-CON) 10 MEQ tablet Take 10 mEq by mouth 2 (two) times daily.     [provider]    Family History Family History  Problem Relation Age of Onset  . Cancer Mother        colon  . Epilepsy Mother   . Cancer Father        prostate  . Diabetes Father   . Hypertension Father   . Hypertension Maternal Aunt   . Diabetes Maternal Aunt   . Hypertension Paternal Aunt     Social History Social History   Tobacco Use  . Smoking status: Former Smoker    Packs/day: 0.50    Years: 4.00    Pack years: 2.00    Last attempt to quit: 09/18/1991    Years since quitting: 26.7  . Smokeless tobacco: Never Used  Substance Use Topics  . Alcohol use: No  . Drug use: No     Allergies   Lisinopril; Bee venom; Propoxyphene; Codeine; Latex; Meloxicam; Morphine and related; and Tomato   Review of Systems Review of Systems  Musculoskeletal: Positive for arthralgias and myalgias.    Neurological: Negative for numbness.  All other systems reviewed and are negative.    Physical Exam Updated Vital Signs BP (!) 130/58 (BP Location: Left Arm)   Pulse 87   Temp 99 F (37.2 C) (Oral)   Resp 16   Ht '5\' 3"'  (1.6 m)   Wt (!) 172.4 kg   SpO2 99%   BMI 67.31 kg/m   Physical Exam  Constitutional: She is oriented to person, place, and time. She appears well-developed and well-nourished.  HENT:  Head: Normocephalic.  Eyes: EOM are normal.  Neck: Normal range of motion.  Pulmonary/Chest: Effort normal.  Abdominal: She exhibits no distension.  Musculoskeletal: Normal range of motion. She exhibits tenderness.  Tender left ankle,  pai with movement,  nv and ns intact.  homan's slight tenderness   Neurological: She is alert and oriented to person, place, and time.  Skin: Skin is warm.  Psychiatric: She has a normal mood and affect.  Nursing note and vitals reviewed.    ED Treatments / Results  Labs (all labs ordered are listed, but only abnormal results are displayed) Labs Reviewed - No data to display  EKG None  Radiology Dg Ankle Complete Right  Result Date: 06/08/2018 CLINICAL DATA:  Acute RIGHT ankle pain for 1 day. No known injury. Initial encounter. EXAM: RIGHT ANKLE - COMPLETE 3+ VIEW FINDINGS: No acute fracture, subluxation or dislocation. Mild soft tissue swelling noted. Small calcaneal spur and mild degenerative changes in the midfoot noted. IMPRESSION: Mild soft tissue swelling without acute bony abnormality. Electronically Signed   By: Margarette Canada M.D.   On: 06/08/2018 13:50    Procedures Procedures (including critical care time)  Medications Ordered  in ED Medications  enoxaparin (LOVENOX) injection 100 mg (100 mg Subcutaneous Given 06/08/18 1338)     Initial Impression / Assessment and Plan / ED Course  I have reviewed the triage vital signs and the nursing notes.  Pertinent labs & imaging results that were available during my care of the  patient were reviewed by me and considered in my medical decision making (see chart for details).     Discussed with Dr. Sabra Heck.  Pt given lovenox.  Ultrasound tomorrow am.  Deatra Robinson Tech called in for another procedure and agreed to do today)    No DVT.   Pt placed in an aso.  Rx for hydrocodone.  Pt advised to see Dr. Aline Brochure for recheck tomorrow    Final Clinical Impressions(s) / ED Diagnoses   Final diagnoses:  Pain  Swelling    ED Discharge Orders         Ordered    US Venous Img Lower Unilateral Right  Status:  Canceled     06/08/18 1303    US Venous Img Lower Unilateral Right     06/08/18 1303        An After Visit Summary was printed and given to the patient. Meds ordered this encounter  Medications  . enoxaparin (LOVENOX) injection 100 mg  . HYDROcodone-acetaminophen (NORCO/VICODIN) 5-325 MG tablet    Sig: Take 2 tablets by mouth every 4 (four) hours as needed.    Dispense:  10 tablet    Refill:  0    Order Specific Question:   Supervising Provider    Answer:   Noemi Chapel [[id number]]   Follow up with Dr. Aline Brochure for recheck.  Call for appointment    Sidney Ace 06/08/18 1543    Noemi Chapel, MD 06/12/18 1339

## 2018-06-09 ENCOUNTER — Other Ambulatory Visit (HOSPITAL_COMMUNITY): Payer: Medicare Other

## 2018-06-11 ENCOUNTER — Inpatient Hospital Stay (HOSPITAL_COMMUNITY): Payer: Medicare Other

## 2018-06-11 ENCOUNTER — Encounter (HOSPITAL_COMMUNITY)
Admission: AD | Disposition: A | Discharge status: Home Health | Payer: Self-pay | Source: Ambulatory Visit | Attending: Specialist

## 2018-06-11 ENCOUNTER — Ambulatory Visit (INDEPENDENT_AMBULATORY_CARE_PROVIDER_SITE_OTHER): Payer: Self-pay

## 2018-06-11 ENCOUNTER — Inpatient Hospital Stay (HOSPITAL_COMMUNITY): Payer: Medicare Other | Admitting: Anesthesiology

## 2018-06-11 ENCOUNTER — Encounter (INDEPENDENT_AMBULATORY_CARE_PROVIDER_SITE_OTHER): Payer: Self-pay | Admitting: Surgery

## 2018-06-11 ENCOUNTER — Encounter (HOSPITAL_COMMUNITY): Payer: Self-pay | Admitting: Certified Registered"

## 2018-06-11 ENCOUNTER — Ambulatory Visit (INDEPENDENT_AMBULATORY_CARE_PROVIDER_SITE_OTHER): Payer: Medicare Other | Admitting: Surgery

## 2018-06-11 ENCOUNTER — Inpatient Hospital Stay (HOSPITAL_COMMUNITY)
Admission: AD | Admit: 2018-06-11 | Discharge: 2018-06-19 | DRG: 857 | Disposition: A | Discharge status: Home Health | Payer: Medicare Other | Source: Ambulatory Visit | Attending: Specialist | Admitting: Specialist

## 2018-06-11 VITALS — BP 118/73 | HR 97

## 2018-06-11 DIAGNOSIS — T8141XD Infection following a procedure, superficial incisional surgical site, subsequent encounter: Secondary | ICD-10-CM | POA: Diagnosis not present

## 2018-06-11 DIAGNOSIS — Z973 Presence of spectacles and contact lenses: Secondary | ICD-10-CM

## 2018-06-11 DIAGNOSIS — M869 Osteomyelitis, unspecified: Secondary | ICD-10-CM

## 2018-06-11 DIAGNOSIS — Z885 Allergy status to narcotic agent status: Secondary | ICD-10-CM | POA: Diagnosis not present

## 2018-06-11 DIAGNOSIS — Z96653 Presence of artificial knee joint, bilateral: Secondary | ICD-10-CM | POA: Diagnosis present

## 2018-06-11 DIAGNOSIS — Z91040 Latex allergy status: Secondary | ICD-10-CM

## 2018-06-11 DIAGNOSIS — F418 Other specified anxiety disorders: Secondary | ICD-10-CM | POA: Diagnosis present

## 2018-06-11 DIAGNOSIS — M949 Disorder of cartilage, unspecified: Secondary | ICD-10-CM

## 2018-06-11 DIAGNOSIS — Z96642 Presence of left artificial hip joint: Secondary | ICD-10-CM | POA: Diagnosis present

## 2018-06-11 DIAGNOSIS — T8142XA Infection following a procedure, deep incisional surgical site, initial encounter: Secondary | ICD-10-CM | POA: Diagnosis present

## 2018-06-11 DIAGNOSIS — I10 Essential (primary) hypertension: Secondary | ICD-10-CM | POA: Diagnosis present

## 2018-06-11 DIAGNOSIS — E039 Hypothyroidism, unspecified: Secondary | ICD-10-CM | POA: Diagnosis present

## 2018-06-11 DIAGNOSIS — Z96649 Presence of unspecified artificial hip joint: Secondary | ICD-10-CM | POA: Diagnosis not present

## 2018-06-11 DIAGNOSIS — M722 Plantar fascial fibromatosis: Secondary | ICD-10-CM | POA: Diagnosis present

## 2018-06-11 DIAGNOSIS — M25571 Pain in right ankle and joints of right foot: Secondary | ICD-10-CM | POA: Diagnosis present

## 2018-06-11 DIAGNOSIS — K219 Gastro-esophageal reflux disease without esophagitis: Secondary | ICD-10-CM | POA: Diagnosis present

## 2018-06-11 DIAGNOSIS — F419 Anxiety disorder, unspecified: Secondary | ICD-10-CM | POA: Diagnosis present

## 2018-06-11 DIAGNOSIS — B965 Pseudomonas (aeruginosa) (mallei) (pseudomallei) as the cause of diseases classified elsewhere: Secondary | ICD-10-CM | POA: Diagnosis present

## 2018-06-11 DIAGNOSIS — Z981 Arthrodesis status: Secondary | ICD-10-CM

## 2018-06-11 DIAGNOSIS — Z452 Encounter for adjustment and management of vascular access device: Secondary | ICD-10-CM

## 2018-06-11 DIAGNOSIS — M25471 Effusion, right ankle: Secondary | ICD-10-CM | POA: Diagnosis present

## 2018-06-11 DIAGNOSIS — Z888 Allergy status to other drugs, medicaments and biological substances status: Secondary | ICD-10-CM

## 2018-06-11 DIAGNOSIS — Y838 Other surgical procedures as the cause of abnormal reaction of the patient, or of later complication, without mention of misadventure at the time of the procedure: Secondary | ICD-10-CM | POA: Diagnosis present

## 2018-06-11 DIAGNOSIS — Z79890 Hormone replacement therapy (postmenopausal): Secondary | ICD-10-CM

## 2018-06-11 DIAGNOSIS — Z87891 Personal history of nicotine dependence: Secondary | ICD-10-CM

## 2018-06-11 DIAGNOSIS — M899 Disorder of bone, unspecified: Secondary | ICD-10-CM | POA: Diagnosis present

## 2018-06-11 DIAGNOSIS — D500 Iron deficiency anemia secondary to blood loss (chronic): Secondary | ICD-10-CM | POA: Diagnosis present

## 2018-06-11 DIAGNOSIS — D62 Acute posthemorrhagic anemia: Secondary | ICD-10-CM | POA: Diagnosis not present

## 2018-06-11 DIAGNOSIS — G0491 Myelitis, unspecified: Secondary | ICD-10-CM | POA: Diagnosis not present

## 2018-06-11 DIAGNOSIS — F40240 Claustrophobia: Secondary | ICD-10-CM | POA: Diagnosis present

## 2018-06-11 DIAGNOSIS — M545 Low back pain, unspecified: Secondary | ICD-10-CM

## 2018-06-11 DIAGNOSIS — Z91030 Bee allergy status: Secondary | ICD-10-CM | POA: Diagnosis not present

## 2018-06-11 DIAGNOSIS — Z91018 Allergy to other foods: Secondary | ICD-10-CM

## 2018-06-11 DIAGNOSIS — Z79899 Other long term (current) drug therapy: Secondary | ICD-10-CM | POA: Diagnosis not present

## 2018-06-11 DIAGNOSIS — T8141XA Infection following a procedure, superficial incisional surgical site, initial encounter: Secondary | ICD-10-CM | POA: Diagnosis present

## 2018-06-11 DIAGNOSIS — M7661 Achilles tendinitis, right leg: Secondary | ICD-10-CM | POA: Diagnosis present

## 2018-06-11 DIAGNOSIS — T8149XA Infection following a procedure, other surgical site, initial encounter: Secondary | ICD-10-CM | POA: Diagnosis present

## 2018-06-11 DIAGNOSIS — Z9049 Acquired absence of other specified parts of digestive tract: Secondary | ICD-10-CM

## 2018-06-11 DIAGNOSIS — M96842 Postprocedural seroma of a musculoskeletal structure following a musculoskeletal system procedure: Secondary | ICD-10-CM | POA: Diagnosis not present

## 2018-06-11 DIAGNOSIS — Z6841 Body Mass Index (BMI) 40.0 and over, adult: Secondary | ICD-10-CM

## 2018-06-11 DIAGNOSIS — M4626 Osteomyelitis of vertebra, lumbar region: Secondary | ICD-10-CM

## 2018-06-11 DIAGNOSIS — G4733 Obstructive sleep apnea (adult) (pediatric): Secondary | ICD-10-CM | POA: Diagnosis present

## 2018-06-11 DIAGNOSIS — Z886 Allergy status to analgesic agent status: Secondary | ICD-10-CM | POA: Diagnosis not present

## 2018-06-11 DIAGNOSIS — Z8249 Family history of ischemic heart disease and other diseases of the circulatory system: Secondary | ICD-10-CM | POA: Diagnosis not present

## 2018-06-11 DIAGNOSIS — Z792 Long term (current) use of antibiotics: Secondary | ICD-10-CM

## 2018-06-11 DIAGNOSIS — M79661 Pain in right lower leg: Secondary | ICD-10-CM

## 2018-06-11 DIAGNOSIS — Z978 Presence of other specified devices: Secondary | ICD-10-CM | POA: Diagnosis not present

## 2018-06-11 HISTORY — PX: LUMBAR WOUND DEBRIDEMENT: SHX1988

## 2018-06-11 LAB — CBC WITH DIFFERENTIAL/PLATELET
Abs Immature Granulocytes: 0 10*3/uL (ref 0.0–0.1)
BASOS PCT: 0 %
Basophils Absolute: 0 10*3/uL (ref 0.0–0.1)
EOS ABS: 0.6 10*3/uL (ref 0.0–0.7)
Eosinophils Relative: 8 %
HCT: 27.5 % — ABNORMAL LOW (ref 36.0–46.0)
Hemoglobin: 8.4 g/dL — ABNORMAL LOW (ref 12.0–15.0)
IMMATURE GRANULOCYTES: 0 %
Lymphocytes Relative: 35 %
Lymphs Abs: 2.5 10*3/uL (ref 0.7–4.0)
MCH: 26 pg (ref 26.0–34.0)
MCHC: 30.5 g/dL (ref 30.0–36.0)
MCV: 85.1 fL (ref 78.0–100.0)
MONOS PCT: 5 %
Monocytes Absolute: 0.4 10*3/uL (ref 0.1–1.0)
NEUTROS ABS: 3.6 10*3/uL (ref 1.7–7.7)
Neutrophils Relative %: 52 %
PLATELETS: 433 10*3/uL — AB (ref 150–400)
RBC: 3.23 MIL/uL — ABNORMAL LOW (ref 3.87–5.11)
RDW: 13.8 % (ref 11.5–15.5)
WBC: 7.1 10*3/uL (ref 4.0–10.5)

## 2018-06-11 LAB — TYPE AND SCREEN
ABO/RH(D): O POS
ANTIBODY SCREEN: NEGATIVE

## 2018-06-11 LAB — COMPREHENSIVE METABOLIC PANEL
ALT: 13 U/L (ref 0–44)
AST: 22 U/L (ref 15–41)
Albumin: 2.9 g/dL — ABNORMAL LOW (ref 3.5–5.0)
Alkaline Phosphatase: 52 U/L (ref 38–126)
Anion gap: 12 (ref 5–15)
BILIRUBIN TOTAL: 0.6 mg/dL (ref 0.3–1.2)
BUN: 5 mg/dL — ABNORMAL LOW (ref 6–20)
CALCIUM: 9.4 mg/dL (ref 8.9–10.3)
CO2: 26 mmol/L (ref 22–32)
Chloride: 97 mmol/L — ABNORMAL LOW (ref 98–111)
Creatinine, Ser: 0.99 mg/dL (ref 0.44–1.00)
GFR calc non Af Amer: >60 mL/min (ref >60)
Glucose, Bld: 95 mg/dL (ref 70–99)
Potassium: 3.3 mmol/L — ABNORMAL LOW (ref 3.5–5.1)
Sodium: 135 mmol/L (ref 135–145)
TOTAL PROTEIN: 6.9 g/dL (ref 6.5–8.1)

## 2018-06-11 LAB — URINALYSIS, ROUTINE W REFLEX MICROSCOPIC
Bilirubin Urine: NEGATIVE
GLUCOSE, UA: NEGATIVE mg/dL
Hgb urine dipstick: NEGATIVE
KETONES UR: NEGATIVE mg/dL
Leukocytes, UA: NEGATIVE
NITRITE: NEGATIVE
PH: 7 (ref 5.0–8.0)
PROTEIN: NEGATIVE mg/dL
Specific Gravity, Urine: 1.017 (ref 1.005–1.030)

## 2018-06-11 LAB — PROTIME-INR
INR: 1.15
Prothrombin Time: 14.6 seconds (ref 11.4–15.2)

## 2018-06-11 LAB — APTT: APTT: 43 s — AB (ref 24–36)

## 2018-06-11 LAB — GLUCOSE, CAPILLARY: Glucose-Capillary: 103 mg/dL — ABNORMAL HIGH (ref 70–99)

## 2018-06-11 SURGERY — IRRIGATION AND DEBRIDEMENT WOUND
Anesthesia: General | Laterality: Right

## 2018-06-11 SURGERY — LUMBAR WOUND DEBRIDEMENT
Anesthesia: General | Laterality: Right

## 2018-06-11 MED ORDER — OXYCODONE HCL 5 MG PO TABS
ORAL_TABLET | ORAL | Status: AC
  Filled 2018-06-11: qty 1

## 2018-06-11 MED ORDER — ROCURONIUM BROMIDE 50 MG/5ML IV SOSY
PREFILLED_SYRINGE | INTRAVENOUS | Status: AC
  Filled 2018-06-11: qty 5

## 2018-06-11 MED ORDER — GABAPENTIN 300 MG PO CAPS
300.0000 mg | ORAL_CAPSULE | Freq: Three times a day (TID) | ORAL | Status: DC
  Administered 2018-06-12 – 2018-06-16 (×15): 300 mg via ORAL
  Filled 2018-06-11 (×17): qty 1

## 2018-06-11 MED ORDER — OXYCODONE HCL ER 10 MG PO T12A
10.0000 mg | EXTENDED_RELEASE_TABLET | Freq: Two times a day (BID) | ORAL | Status: DC
  Administered 2018-06-12 – 2018-06-19 (×16): 10 mg via ORAL
  Filled 2018-06-11 (×17): qty 1

## 2018-06-11 MED ORDER — OXYCODONE HCL 5 MG PO TABS
5.0000 mg | ORAL_TABLET | ORAL | Status: DC | PRN
  Administered 2018-06-11 – 2018-06-15 (×3): 5 mg via ORAL
  Filled 2018-06-11 (×2): qty 1

## 2018-06-11 MED ORDER — VANCOMYCIN HCL IN DEXTROSE 1-5 GM/200ML-% IV SOLN
INTRAVENOUS | Status: AC
  Filled 2018-06-11: qty 200

## 2018-06-11 MED ORDER — LORAZEPAM 2 MG/ML IJ SOLN: 1.0000 mg | Freq: Once | INTRAMUSCULAR | Status: DC

## 2018-06-11 MED ORDER — SUCCINYLCHOLINE CHLORIDE 200 MG/10ML IV SOSY
PREFILLED_SYRINGE | INTRAVENOUS | Status: AC
  Filled 2018-06-11: qty 10

## 2018-06-11 MED ORDER — BUPIVACAINE-EPINEPHRINE (PF) 0.25% -1:200000 IJ SOLN
INTRAMUSCULAR | Status: AC
  Filled 2018-06-11: qty 30

## 2018-06-11 MED ORDER — PHENYLEPHRINE 40 MCG/ML (10ML) SYRINGE FOR IV PUSH (FOR BLOOD PRESSURE SUPPORT)
PREFILLED_SYRINGE | INTRAVENOUS | Status: AC
  Filled 2018-06-11: qty 10

## 2018-06-11 MED ORDER — MIDAZOLAM HCL 5 MG/5ML IJ SOLN
INTRAMUSCULAR | Status: DC | PRN
  Administered 2018-06-11: 2 mg via INTRAVENOUS

## 2018-06-11 MED ORDER — EPHEDRINE 5 MG/ML INJ
INTRAVENOUS | Status: AC
  Filled 2018-06-11: qty 10

## 2018-06-11 MED ORDER — POVIDONE-IODINE 10 % EX SWAB: 2.0000 "application " | Freq: Once | CUTANEOUS | Status: DC

## 2018-06-11 MED ORDER — CHLORHEXIDINE GLUCONATE 4 % EX LIQD: 60.0000 mL | Freq: Once | CUTANEOUS | Status: DC

## 2018-06-11 MED ORDER — FENTANYL CITRATE (PF) 100 MCG/2ML IJ SOLN
INTRAMUSCULAR | Status: AC
  Filled 2018-06-11: qty 2

## 2018-06-11 MED ORDER — HYDROMORPHONE HCL 1 MG/ML IJ SOLN
INTRAMUSCULAR | Status: AC
  Filled 2018-06-11: qty 1

## 2018-06-11 MED ORDER — LACTATED RINGERS IV SOLN
INTRAVENOUS | Status: DC | PRN
  Administered 2018-06-11 (×2): via INTRAVENOUS

## 2018-06-11 MED ORDER — HYDROMORPHONE HCL 1 MG/ML IJ SOLN
1.0000 mg | INTRAMUSCULAR | Status: DC | PRN
  Administered 2018-06-11 – 2018-06-16 (×4): 1 mg via INTRAVENOUS
  Filled 2018-06-11 (×4): qty 1

## 2018-06-11 MED ORDER — PROPOFOL 10 MG/ML IV BOLUS
INTRAVENOUS | Status: AC
  Filled 2018-06-11: qty 20

## 2018-06-11 MED ORDER — SUGAMMADEX SODIUM 200 MG/2ML IV SOLN
INTRAVENOUS | Status: DC | PRN
  Administered 2018-06-11: 500 mg via INTRAVENOUS

## 2018-06-11 MED ORDER — FENTANYL CITRATE (PF) 250 MCG/5ML IJ SOLN
INTRAMUSCULAR | Status: AC
  Filled 2018-06-11: qty 5

## 2018-06-11 MED ORDER — SODIUM CHLORIDE 0.9 % IV SOLN
INTRAVENOUS | Status: AC
  Filled 2018-06-11: qty 500000

## 2018-06-11 MED ORDER — ONDANSETRON HCL 4 MG/2ML IJ SOLN
INTRAMUSCULAR | Status: DC | PRN
  Administered 2018-06-11: 4 mg via INTRAVENOUS

## 2018-06-11 MED ORDER — SODIUM CHLORIDE 0.9 % IV SOLN
INTRAVENOUS | Status: DC | PRN
  Administered 2018-06-11: 500 mL

## 2018-06-11 MED ORDER — BUPIVACAINE HCL (PF) 0.5 % IJ SOLN
INTRAMUSCULAR | Status: AC
  Filled 2018-06-11: qty 30

## 2018-06-11 MED ORDER — PIPERACILLIN-TAZOBACTAM 3.375 G IVPB
3.3750 g | Freq: Once | INTRAVENOUS | Status: AC
  Administered 2018-06-11: 3.375 g via INTRAVENOUS
  Filled 2018-06-11: qty 50

## 2018-06-11 MED ORDER — FENTANYL CITRATE (PF) 250 MCG/5ML IJ SOLN
INTRAMUSCULAR | Status: DC | PRN
  Administered 2018-06-11 (×2): 50 ug via INTRAVENOUS
  Administered 2018-06-11 (×2): 100 ug via INTRAVENOUS
  Administered 2018-06-11: 50 ug via INTRAVENOUS

## 2018-06-11 MED ORDER — MIDAZOLAM HCL 2 MG/2ML IJ SOLN
INTRAMUSCULAR | Status: AC
  Filled 2018-06-11: qty 2

## 2018-06-11 MED ORDER — FENTANYL CITRATE (PF) 100 MCG/2ML IJ SOLN
25.0000 ug | INTRAMUSCULAR | Status: DC | PRN
  Administered 2018-06-11 (×3): 50 ug via INTRAVENOUS

## 2018-06-11 MED ORDER — THROMBIN (RECOMBINANT) 20000 UNITS EX SOLR
CUTANEOUS | Status: AC
  Filled 2018-06-11: qty 20000

## 2018-06-11 MED ORDER — ALBUMIN HUMAN 5 % IV SOLN
INTRAVENOUS | Status: DC | PRN
  Administered 2018-06-11: 22:00:00 via INTRAVENOUS

## 2018-06-11 MED ORDER — ROCURONIUM BROMIDE 10 MG/ML (PF) SYRINGE
PREFILLED_SYRINGE | INTRAVENOUS | Status: DC | PRN
  Administered 2018-06-11: 40 mg via INTRAVENOUS
  Administered 2018-06-11: 10 mg via INTRAVENOUS

## 2018-06-11 MED ORDER — PROPOFOL 10 MG/ML IV BOLUS
INTRAVENOUS | Status: DC | PRN
  Administered 2018-06-11: 180 mg via INTRAVENOUS
  Administered 2018-06-11: 20 mg via INTRAVENOUS

## 2018-06-11 MED ORDER — SODIUM CHLORIDE 0.9 % IR SOLN
Status: DC | PRN
  Administered 2018-06-11: 1000 mL

## 2018-06-11 MED ORDER — VANCOMYCIN HCL 1000 MG IV SOLR
INTRAVENOUS | Status: DC | PRN
  Administered 2018-06-11: 1000 mg via INTRAVENOUS

## 2018-06-11 SURGICAL SUPPLY — 73 items
AIRSTRIP 3X4 (GAUZE/BANDAGES/DRESSINGS) IMPLANT
BANDAGE ACE 6X5 VEL STRL LF (GAUZE/BANDAGES/DRESSINGS) IMPLANT
BLADE CUDA 5.5 (BLADE) IMPLANT
BLADE GREAT WHITE 4.2 (BLADE) IMPLANT
BNDG GAUZE ELAST 4 BULKY (GAUZE/BANDAGES/DRESSINGS) IMPLANT
BOOTCOVER CLEANROOM LRG (PROTECTIVE WEAR) IMPLANT
BUR OVAL 6.0 (BURR) IMPLANT
BUR RND FLUTED 2.5 (BURR) IMPLANT
BUR SABER RD CUTTING 3.0 (BURR) IMPLANT
COVER SURGICAL LIGHT HANDLE (MISCELLANEOUS) IMPLANT
CUFF TOURNIQUET SINGLE 34IN LL (TOURNIQUET CUFF) IMPLANT
CUFF TOURNIQUET SINGLE 44IN (TOURNIQUET CUFF) IMPLANT
DERMABOND ADVANCED (GAUZE/BANDAGES/DRESSINGS)
DERMABOND ADVANCED .7 DNX12 (GAUZE/BANDAGES/DRESSINGS) IMPLANT
DRAPE ARTHROSCOPY W/POUCH 114 (DRAPES) IMPLANT
DRAPE HALF SHEET 40X57 (DRAPES) IMPLANT
DRAPE INCISE IOBAN 66X45 STRL (DRAPES) IMPLANT
DRAPE MICROSCOPE LEICA (MISCELLANEOUS) ×3 IMPLANT
DRAPE POUCH INSTRU U-SHP 10X18 (DRAPES) IMPLANT
DRAPE SURG 17X23 STRL (DRAPES) ×12 IMPLANT
DRSG MEPILEX BORDER 4X4 (GAUZE/BANDAGES/DRESSINGS) IMPLANT
DRSG MEPILEX BORDER 4X8 (GAUZE/BANDAGES/DRESSINGS) IMPLANT
DRSG PAD ABDOMINAL 8X10 ST (GAUZE/BANDAGES/DRESSINGS) IMPLANT
DRSG VAC ATS MED SENSATRAC (GAUZE/BANDAGES/DRESSINGS) ×3 IMPLANT
DURAPREP 26ML APPLICATOR (WOUND CARE) IMPLANT
ELECT CAUTERY BLADE 6.4 (BLADE) IMPLANT
ELECT REM PT RETURN 9FT ADLT (ELECTROSURGICAL) ×3
ELECTRODE REM PT RTRN 9FT ADLT (ELECTROSURGICAL) ×2 IMPLANT
EVACUATOR 1/8 PVC DRAIN (DRAIN) IMPLANT
GAUZE SPONGE 4X4 12PLY STRL (GAUZE/BANDAGES/DRESSINGS) IMPLANT
GAUZE XEROFORM 1X8 LF (GAUZE/BANDAGES/DRESSINGS) IMPLANT
GLOVE BIOGEL PI IND STRL 8 (GLOVE) IMPLANT
GLOVE BIOGEL PI INDICATOR 8 (GLOVE)
GLOVE ECLIPSE 9.0 STRL (GLOVE) IMPLANT
GLOVE ORTHO TXT STRL SZ7.5 (GLOVE) IMPLANT
GLOVE SURG 8.5 LATEX PF (GLOVE) IMPLANT
GOWN STRL REUS W/ TWL LRG LVL3 (GOWN DISPOSABLE) ×2 IMPLANT
GOWN STRL REUS W/TWL 2XL LVL3 (GOWN DISPOSABLE) ×3 IMPLANT
GOWN STRL REUS W/TWL LRG LVL3 (GOWN DISPOSABLE) ×1
KIT BASIN OR (CUSTOM PROCEDURE TRAY) ×3 IMPLANT
KIT TURNOVER KIT B (KITS) ×3 IMPLANT
MANIFOLD NEPTUNE II (INSTRUMENTS) IMPLANT
NEEDLE 22X1 1/2 (OR ONLY) (NEEDLE) ×3 IMPLANT
NEEDLE HYPO 25GX1X1/2 BEV (NEEDLE) IMPLANT
NEEDLE SPNL 18GX3.5 QUINCKE PK (NEEDLE) ×6 IMPLANT
NS IRRIG 1000ML POUR BTL (IV SOLUTION) ×3 IMPLANT
PACK ARTHROSCOPY DSU (CUSTOM PROCEDURE TRAY) IMPLANT
PACK LAMINECTOMY ORTHO (CUSTOM PROCEDURE TRAY) ×3 IMPLANT
PAD ARMBOARD 7.5X6 YLW CONV (MISCELLANEOUS) ×12 IMPLANT
PADDING CAST COTTON 6X4 STRL (CAST SUPPLIES) IMPLANT
PATTIES SURGICAL .5 X.5 (GAUZE/BANDAGES/DRESSINGS) IMPLANT
PATTIES SURGICAL .75X.75 (GAUZE/BANDAGES/DRESSINGS) IMPLANT
PATTIES SURGICAL 1X1 (DISPOSABLE) IMPLANT
SET ARTHROSCOPY TUBING (MISCELLANEOUS)
SET ARTHROSCOPY TUBING LN (MISCELLANEOUS) IMPLANT
SPONGE LAP 4X18 RFD (DISPOSABLE) ×3 IMPLANT
SPONGE SURGIFOAM ABS GEL 100 (HEMOSTASIS) IMPLANT
STRAP ANKLE FOOT DISTRACTOR (ORTHOPEDIC SUPPLIES) IMPLANT
SUT ETHILON 4 0 PS 2 18 (SUTURE) IMPLANT
SUT VIC AB 0 CT1 27 (SUTURE)
SUT VIC AB 0 CT1 27XBRD ANBCTR (SUTURE) IMPLANT
SUT VIC AB 1 CT1 27 (SUTURE)
SUT VIC AB 1 CT1 27XBRD ANBCTR (SUTURE) IMPLANT
SUT VIC AB 2-0 CT1 27 (SUTURE)
SUT VIC AB 2-0 CT1 TAPERPNT 27 (SUTURE) IMPLANT
SUT VIC AB 3-0 X1 27 (SUTURE) IMPLANT
SUT VICRYL 0 UR6 27IN ABS (SUTURE) IMPLANT
SYR CONTROL 10ML LL (SYRINGE) ×3 IMPLANT
TOWEL OR 17X24 6PK STRL BLUE (TOWEL DISPOSABLE) ×6 IMPLANT
TOWEL OR 17X26 10 PK STRL BLUE (TOWEL DISPOSABLE) IMPLANT
TRAY FOLEY MTR SLVR 16FR STAT (SET/KITS/TRAYS/PACK) IMPLANT
WATER STERILE IRR 1000ML POUR (IV SOLUTION) IMPLANT
YANKAUER SUCT BULB TIP NO VENT (SUCTIONS) ×3 IMPLANT

## 2018-06-11 NOTE — Progress Notes (Addendum)
Patient ID: Marilyn [name], female   DOB: 01-09-1971, 47 y.o.   MRN: [id number] 47 year old female with history of L4-5 and L5-S1 TLIF lumbar fusion surgery 05/27/2018. She did well post op and discharged on POD#3. She noticed increasing lumbar swelling over the last one week, then started draining purulent drainage about 4-5 days ago. She preented to the ER 9/22 with complaints of right leg swelling and right ankle pain and swelling. No trauma. She presented to our office for followup today with diffuse peri lumbar incision swelling and warmth with a dressing saturated with purulent drainage. She is afebrile and has had no fever or chills.  She is painful over the right leg and ankle.   Lumbar incision with seropurulent drainage, staples are intact. Legs are NV normal.  Right ankle with warmth, swelling and tenderness to palpation. SLR is negative Right leg is diffusely painful to movement.  Radiographs: pedicle screws and rods L4-S1, no lucency. Cages with graft in place within the intervertebral disc spaces L4-5 and L5-S1.  DX: lumbar fusion incision site infection, acute post op 2 weeks. Right ankle effusion, need to assess for infection, will perform aspiration at the beginning of procedure.  Due to acute presentation and claustrophobia in MRIs I will Cancel plan for MRI and proceed with OR with aspiration of the right ankle to rule out infection, then perform lumbar incision, irrigation and debridement. With cultures. Hold IV antibiotics until the cultures are obtained.

## 2018-06-11 NOTE — H&P (Signed)
PREOPERATIVE H&P  Chief Complaint: wound infection  HPI: Marilyn [name] is a 47 y.o. female with history of L4-5 and L5-S1 TLIF lumbar fusion surgery 05/27/2018. She did well post op and discharged on POD#3. She noticed increasing lumbar swelling over the last one week, then started draining purulent drainage about 4-5 days ago. She preented to the ER 9/22 with complaints of right leg swelling and right ankle pain and swelling. No trauma. She presented to our office for followup today with diffuse peri lumbar incision swelling and warmth with a dressing saturated with purulent drainage. She is afebrile and has had no fever or chills.  She is painful over the right leg and ankle.    Past Medical History:  Diagnosis Date  . Anemia    taking iron now. pt states having no current issues 09/02/2015  . Anginal pain ([place])    pt states experiences chest wall pain pt states related to her asthma   . Anxiety    with MRI's  . Arthritis    Everywhere  . Asthma   . Dizziness   . GERD (gastroesophageal reflux disease)   . Headache(784.0)    HX OF MIGRAINES  . History of bronchitis   . Hypertension   . Hypothyroidism    takes levothyroxen  . OSA on CPAP   . Shortness of breath    with exertion  . Wears glasses    Past Surgical History:  Procedure Laterality Date  . CESAREAN SECTION     times 2  . CHOLECYSTECTOMY    . ENDOMETRIAL ABLATION    . INCISION AND DRAINAGE HIP Left 11/10/2015   Procedure: IRRIGATION AND DEBRIDEMENT LEFT HIP INCISION;  Surgeon: Mcarthur Rossetti, MD;  Location: [place];  Service: Orthopedics;  Laterality: Left;  . JOINT REPLACEMENT  2011   total left knee  . KNEE ARTHROPLASTY  04/23/2012   right   . KNEE ARTHROSCOPY    . ROTATOR CUFF REPAIR     left   . SHOULDER SURGERY     right to repair ligament tear  . TOTAL HIP ARTHROPLASTY Left 09/09/2015   Procedure: LEFT TOTAL HIP ARTHROPLASTY ANTERIOR APPROACH;  Surgeon: Mcarthur Rossetti, MD;  Location: [facility];  Service: Orthopedics;  Laterality: Left;  . TOTAL KNEE ARTHROPLASTY  04/23/2012   Procedure: TOTAL KNEE ARTHROPLASTY;  Surgeon: Newt Minion, MD;  Location: [place];  Service: Orthopedics;  Laterality: Right;  Right Total Knee Arthroplasty  . TUBAL LIGATION  1996   Social History   Socioeconomic History  . Marital status: Single    Spouse name: Not on file  . Number of children: Not on file  . Years of education: Not on file  . Highest education level: Not on file  Occupational History  . Not on file  Social Needs  . Financial resource strain: Not on file  . Food insecurity:    Worry: Not on file    Inability: Not on file  . Transportation needs:    Medical: Not on file    Non-medical: Not on file  Tobacco Use  . Smoking status: Former Smoker    Packs/day: 0.50    Years: 4.00    Pack years: 2.00    Last attempt to quit: 09/18/1991    Years since quitting: 26.7  . Smokeless tobacco: Never Used  Substance and Sexual Activity  . Alcohol use: No  . Drug use: No  . Sexual activity: Yes    Birth  control/protection: Surgical  Lifestyle  . Physical activity:    Days per week: Not on file    Minutes per session: Not on file  . Stress: Not on file  Relationships  . Social connections:    Talks on phone: Not on file    Gets together: Not on file    Attends religious service: Not on file    Active member of club or organization: Not on file    Attends meetings of clubs or organizations: Not on file    Relationship status: Not on file  Other Topics Concern  . Not on file  Social History Narrative  . Not on file   Family History  Problem Relation Age of Onset  . Cancer Mother        colon  . Epilepsy Mother   . Cancer Father        prostate  . Diabetes Father   . Hypertension Father   . Hypertension Maternal Aunt   . Diabetes Maternal Aunt   . Hypertension Paternal Aunt    Allergies  Allergen Reactions  . Lisinopril Anaphylaxis  . Bee Venom Swelling and Other  (See Comments)    On site swelling  . Propoxyphene Hives  . Codeine Nausea Only  . Latex Rash  . Meloxicam Diarrhea and Other (See Comments)    Insomnia, constipation  . Morphine And Related Rash  . Tomato Rash   Prior to Admission medications   Medication Sig Start Date End Date Taking? Authorizing Provider  albuterol (PROVENTIL HFA;VENTOLIN HFA) 108 (90 BASE) MCG/ACT inhaler Inhale 2 puffs into the lungs every 6 (six) hours as needed for wheezing or shortness of breath.    [provider]  albuterol (PROVENTIL) (2.5 MG/3ML) 0.083% nebulizer solution Take 3 mLs (2.5 mg total) by nebulization 3 (three) times daily. 07/19/16   Thurnell Lose, MD  amoxicillin-clavulanate (AUGMENTIN) 500-125 MG tablet Take 1 tablet (500 mg total) by mouth 3 (three) times daily. 05/26/18   Jessy Oto, MD  buprenorphine (SUBUTEX) 8 MG SUBL SL tablet Place 8 mg under the tongue 2 (two) times daily.     [provider]  cetirizine (ZYRTEC) 10 MG tablet Take 10 mg by mouth daily as needed for allergies.     [provider]  cyclobenzaprine (FLEXERIL) 10 MG tablet TAKE 1 TABLET BY MOUTH THREE TIMES DAILY AS NEEDED FOR MUSCLE SPASM Patient taking differently: Take 10 mg by mouth 3 (three) times daily as needed for muscle spasms.  05/02/17   Mcarthur Rossetti, MD  diphenhydrAMINE (BENADRYL) 25 MG tablet Take 1 tablet (25 mg total) by mouth every 6 (six) hours. Patient taking differently: Take 25 mg by mouth every 6 (six) hours as needed for itching or allergies.  09/29/15   Jola Schmidt, MD  docusate sodium (COLACE) 100 MG capsule Take 1 capsule (100 mg total) by mouth 2 (two) times daily. 05/30/18   Jessy Oto, MD  ergocalciferol (VITAMIN D2) 50000 units capsule Take 50,000 Units by mouth every Monday.     [provider]  ferrous gluconate (FERGON) 324 MG tablet Take 1 tablet (324 mg total) by mouth 2 (two) times daily with a meal. 05/30/18   Jessy Oto, MD   furosemide (LASIX) 20 MG tablet Take 20 mg by mouth daily as needed for fluid.  11/01/17   [provider]  hydrochlorothiazide 25 MG tablet Take 25 mg by mouth daily.     [provider]  levothyroxine (SYNTHROID, LEVOTHROID) 137 MCG tablet Take 137 mcg by mouth daily before breakfast.  05/31/17   [provider]  metoCLOPramide (REGLAN) 5 MG tablet Take 5 mg by mouth 3 (three) times daily before meals.    [provider]  NARCAN 4 MG/0.1ML LIQD nasal spray kit Place 0.4 mg into the nose once.  06/28/17   [provider]  ondansetron (ZOFRAN ODT) 8 MG disintegrating tablet Take 1 tablet (8 mg total) by mouth every 8 (eight) hours as needed for nausea or vomiting. 03/03/18   Dorie Rank, MD  oxybutynin (DITROPAN-XL) 10 MG 24 hr tablet Take 10 mg by mouth daily.  05/31/17   [provider]  pantoprazole (PROTONIX) 20 MG tablet Take 1 tablet (20 mg total) by mouth 2 (two) times daily before a meal. 03/13/18   Virgel Manifold, MD  potassium chloride (K-DUR,KLOR-CON) 10 MEQ tablet Take 10 mEq by mouth 2 (two) times daily.     [provider]     Positive ROS: All other systems have been reviewed and were otherwise negative with the exception of those mentioned in the HPI and as above.  Physical Exam: General: Alert, no acute distress Cardiovascular: No pedal edema Respiratory: No cyanosis, no use of accessory musculature GI: No organomegaly, abdomen is soft and non-tender Skin: No lesions in the area of chief complaint Neurologic: Sensation intact distally Psychiatric: Patient is competent for consent with normal mood and affect Lymphatic: No axillary or cervical lymphadenopathy  MUSCULOSKELETAL:Lumbar incision with seropurulent drainage, staples are intact. Legs are NV normal.  Right ankle with warmth, swelling and tenderness to palpation. SLR is negative Right leg is diffusely painful to movement.  Radiographs: pedicle screws and  rods L4-S1, no lucency. Cages with graft in place within the intervertebral disc spaces L4-5 and L5-S1.   Assessment: Lumbar fusion surgical site wound infection. Right ankle effusion Morbid obesity.   Plan: Plan for Procedure(s): LUMBAR WOUND DEBRIDEMENT with aspiration of right ankle ANKLE ARTHROSCOPY lavage Plan to perform right ankle aspiration for stat gram stain, proceed with lumbar incision exploration with I&D. Pending the results of the right ankle aspiration possible arthroscopic irrigation of the right ankle.   The risks benefits and alternatives were discussed with the patient including but not limited to the risks of nonoperative treatment, versus surgical intervention including infection, bleeding, nerve injury,  blood clots, cardiopulmonary complications, morbidity, mortality, among others, and they were willing to proceed.   Basil Dess, MD Cell [phone] Office [phone] 06/11/2018 6:02 PM

## 2018-06-11 NOTE — Progress Notes (Signed)
Office Visit Note   Patient: Marilyn [name]           Date of Birth: June 08, 1971           MRN: [id number] Visit Date: 06/11/2018              Requested by: Joette Catching, MD [address] [place], [place] [address] PCP: Joette Catching, MD   Assessment & Plan: Visit Diagnoses:  1. Lumbar pain   2. Right calf pain   3. Pain in right ankle and joints of right foot   4. Infection of lumbar spine (HCC)     Plan: Patient seen and examined with Dr. Otelia Sergeant as well.  He does feel the patient does have a postop lumbar infection.  Will directly admit patient to the hospital plan for lumbar irrigation and debridement.  We will need to get stat lumbar MRI.  Patient's worsening right calf and ankle pain she will also need right lower extremity venous Doppler to rule out DVT.  She did have a venous Doppler emergency room June 08, 2018 at any pain report negative.  We may need to consider repeating that study.   patient will be n.p.o.   Currently [facility] does not have any beds available.  Bed placement will give patient a call when they have some patients discharge so she can be a direct admit.   Follow-Up Instructions: Return if symptoms worsen or fail to improve.   Orders:  Orders Placed This Encounter  Procedures  . XR Lumbar Spine 2-3 Views   No orders of the defined types were placed in this encounter.     Procedures: No procedures performed   Clinical Data: No additional findings.   Subjective: Chief Complaint  Patient presents with  . Spine - Routine Post Op    HPI 47 year old white female who is a couple weeks out from lumbar fusion returns.  Patient states that areas of her wound have been open and draining purulent fluid.  No complaints of fever chills.  States that her preop leg pain is swelling.  Was seen at the ER Sunday.  Diagnosed with possible gout.  Pain was sudden onset.  She did have venous Doppler negative for DVT. Review of Systems No cardiac  pulmonary GI GU issues  Objective: Vital Signs: BP 118/73   Pulse 97   Physical Exam  Constitutional: No distress.  HENT:  Head: Normocephalic and atraumatic.  Eyes: Pupils are equal, round, and reactive to light. EOM are normal.  Musculoskeletal:  Lumbar spine she does have areas of wound dehiscence.  Purulent drainage.  Moderate to markedly tender down to the ankle and foot.  There is swelling.  No signs of lower extremity infection.  Left lower extremity unremarkable.  Neurologically intact.  Neurological: She is alert.  Skin: Skin is warm and dry.    Ortho Exam  Specialty Comments:  No specialty comments available.  Imaging: No results found.   PMFS History: Patient Active Problem List   Diagnosis Date Noted  . Morbid (severe) obesity due to excess calories (HCC) 05/29/2018    Class: Chronic  . Spondylolisthesis, lumbar region 05/27/2018    Class: Chronic  . Spinal stenosis of lumbar region 05/27/2018    Class: Chronic  . Urinary tract infection 05/27/2018    Class: Acute  . Fusion of spine of lumbar region 05/27/2018  . Pain of right hip joint 07/03/2017  . Acute right-sided low back pain with right-sided sciatica 03/27/2017  .  Chronic right shoulder pain 02/13/2017  . History of rotator cuff tear 11/30/2016  . Impingement syndrome of right shoulder 11/30/2016  . Exacerbation of asthma 07/18/2016  . Hypokalemia 07/18/2016  . Hypertension 07/18/2016  . Depression with anxiety 07/18/2016  . Hypothyroidism 07/18/2016  . Hyperglycemia 07/18/2016  . Acute asthma exacerbation 07/18/2016  . Surgical wound dehiscence left hip; questionable superficial infection 11/10/2015  . Left hip postoperative wound infection 11/10/2015  . Osteoarthritis of left hip 09/09/2015  . 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 Obesity (BMI 35.0-39.9 without comorbidity) 04/28/2013   Past Medical History:  Diagnosis Date  . Anemia    taking iron now. pt states having  no current issues 09/02/2015  . Anginal pain (HCC)    pt states experiences chest wall pain pt states related to her asthma   . Anxiety    with MRI's  . Arthritis    Everywhere  . Asthma   . Dizziness   . GERD (gastroesophageal reflux disease)   . Headache(784.0)    HX OF MIGRAINES  . History of bronchitis   . Hypertension   . Hypothyroidism    takes levothyroxen  . OSA on CPAP   . Shortness of breath    with exertion  . Wears glasses     Family History  Problem Relation Age of Onset  . Cancer Mother        colon  . Epilepsy Mother   . Cancer Father        prostate  . Diabetes Father   . Hypertension Father   . Hypertension Maternal Aunt   . Diabetes Maternal Aunt   . Hypertension Paternal Aunt     Past Surgical History:  Procedure Laterality Date  . CESAREAN SECTION     times 2  . CHOLECYSTECTOMY    . ENDOMETRIAL ABLATION    . INCISION AND DRAINAGE HIP Left 11/10/2015   Procedure: IRRIGATION AND DEBRIDEMENT LEFT HIP INCISION;  Surgeon: Kathryne Hitch, MD;  Location: [facility] OR;  Service: Orthopedics;  Laterality: Left;  . JOINT REPLACEMENT  2011   total left knee  . KNEE ARTHROPLASTY  04/23/2012   right   . KNEE ARTHROSCOPY    . ROTATOR CUFF REPAIR     left   . SHOULDER SURGERY     right to repair ligament tear  . TOTAL HIP ARTHROPLASTY Left 09/09/2015   Procedure: LEFT TOTAL HIP ARTHROPLASTY ANTERIOR APPROACH;  Surgeon: Kathryne Hitch, MD;  Location: [facility] OR[facility];  Service: Orthopedics;  Laterality: Left;  . TOTAL KNEE ARTHROPLASTY  04/23/2012   Procedure: TOTAL KNEE ARTHROPLASTY;  Surgeon: [name], MD;  Location: [facility] OR;  Service: Orthopedics;  Laterality: Right;  Right Total Knee Arthroplasty  . TUBAL LIGATION  1996   Social History   Occupational History  . Not on file  Tobacco Use  . Smoking status: Former Smoker    Packs/day: 0.50    Years: 4.00    Pack years: 2.00    Last attempt to quit: 09/18/1991    Years since quitting: 26.7  .  Smokeless tobacco: Never Used  Substance and Sexual Activity  . Alcohol use: No  . Drug use: No  . Sexual activity: Yes    Birth control/protection: Surgical

## 2018-06-11 NOTE — Anesthesia Procedure Notes (Addendum)
Central Venous Catheter Insertion Performed by: Dorris Singh, MD, anesthesiologist Start/End9/25/2019 8:45 PM, 06/11/2018 9:00 PM Patient location: Pre-op. Preanesthetic checklist: patient identified, IV checked, site marked, risks and benefits discussed, surgical consent, monitors and equipment checked, pre-op evaluation and timeout performed Position: Trendelenburg Lidocaine 1% used for infiltration and patient sedated Hand hygiene performed , maximum sterile barriers used  and Seldinger technique used Central line was placed.Double lumen Procedure performed using ultrasound guided technique. Ultrasound Notes:anatomy identified Attempts: 1 Following insertion, line sutured. Post procedure assessment: blood return through all ports  Patient tolerated the procedure well with no immediate complications.

## 2018-06-11 NOTE — Anesthesia Preprocedure Evaluation (Signed)
Anesthesia Evaluation  Patient identified by MRN, date of birth, ID band Patient awake    Reviewed: Allergy & Precautions, NPO status , Patient's Chart, lab work & pertinent test results  Airway Mallampati: II  TM Distance: >3 FB     Dental   Pulmonary shortness of breath, asthma , sleep apnea , former smoker,    breath sounds clear to auscultation       Cardiovascular hypertension,  Rhythm:Regular Rate:Normal     Neuro/Psych  Headaches,    GI/Hepatic Neg liver ROS, GERD  ,  Endo/Other  Hypothyroidism   Renal/GU negative Renal ROS     Musculoskeletal  (+) Arthritis ,   Abdominal   Peds  Hematology  (+) anemia ,   Anesthesia Other Findings   Reproductive/Obstetrics                             Anesthesia Physical Anesthesia Plan  ASA: III and emergent  Anesthesia Plan: General   Post-op Pain Management:    Induction: Intravenous, Rapid sequence and Cricoid pressure planned  PONV Risk Score and Plan: Ondansetron, Dexamethasone and Midazolam  Airway Management Planned: Oral ETT  Additional Equipment:   Intra-op Plan:   Post-operative Plan: Possible Post-op intubation/ventilation  Informed Consent: I have reviewed the patients History and Physical, chart, labs and discussed the procedure including the risks, benefits and alternatives for the proposed anesthesia with the patient or authorized representative who has indicated his/her understanding and acceptance.   Dental advisory given  Plan Discussed with: CRNA and Anesthesiologist  Anesthesia Plan Comments:         Anesthesia Quick Evaluation

## 2018-06-11 NOTE — H&P (Signed)
All Notes   Progress Notes by Annabell Sabal, CMA at 06/11/2018 10:15 AM  Author: Annabell Sabal, CMA Author Type: Certified Medical Assistant Filed: 06/11/2018 12:37 PM  Note Status: Signed Cosign: Cosign Not Required Encounter Date: 06/11/2018  Editor: Delbert Phenix (Physician Assistant Certified)  Prior Versions: 1. Annabell Sabal, CMA (Certified Medical Assistant) at 06/11/2018 10:48 AM - Sign at close encounter      Office Visit Note              Patient: Marilyn [name]                                      Date of Birth: 19-Jul-1971                                                    MRN: [id number] Visit Date: 06/11/2018                                                                     Requested by: Joette Catching, MD [address] [place], [place] [address] PCP: Joette Catching, MD   Assessment & Plan: Visit Diagnoses:  1. Lumbar pain   2. Right calf pain   3. Pain in right ankle and joints of right foot   4. Infection of lumbar spine (HCC)     Plan: Patient seen and examined with Dr. Otelia Sergeant as well.  He does feel the patient does have a postop lumbar infection.  Will directly admit patient to the hospital plan for lumbar irrigation and debridement.  We will need to get stat lumbar MRI.  Patient's worsening right calf and ankle pain she will also need right lower extremity venous Doppler to rule out DVT.  She did have a venous Doppler emergency room June 08, 2018 at any pain report negative.  We may need to consider repeating that study.   patient will be n.p.o.   Currently [facility] does not have any beds available.  Bed placement will give patient a call when they have some patients discharge so she can be a direct admit.   Follow-Up Instructions: Return if symptoms worsen or fail to improve.   Orders:     Orders Placed This Encounter  Procedures  . XR Lumbar Spine 2-3 Views   No orders of the defined types were placed in this  encounter.     Procedures: No procedures performed   Clinical Data: No additional findings.   Subjective:    Chief Complaint  Patient presents with  . Spine - Routine Post Op    HPI 47 year old white female who is a couple weeks out from lumbar fusion returns.  Patient states that areas of her wound have been open and draining purulent fluid.  No complaints of fever chills.  States that her preop leg pain is swelling.  Was seen at the ER Sunday.  Diagnosed with possible gout.  Pain was sudden onset.  She did have venous Doppler negative for DVT. Review of Systems No  cardiac pulmonary GI GU issues  Objective: Vital Signs: BP 118/73   Pulse 97   Physical Exam  Constitutional: No distress.  HENT:  Head: Normocephalic and atraumatic.  Eyes: Pupils are equal, round, and reactive to light. EOM are normal.  Musculoskeletal:  Lumbar spine she does have areas of wound dehiscence.  Purulent drainage.  Moderate to markedly tender down to the ankle and foot.  There is swelling.  No signs of lower extremity infection.  Left lower extremity unremarkable.  Neurologically intact.  Neurological: She is alert.  Skin: Skin is warm and dry.    Ortho Exam  Specialty Comments:  No specialty comments available.  Imaging: No results found.   PMFS History:      Patient Active Problem List   Diagnosis Date Noted  . Morbid (severe) obesity due to excess calories (HCC) 05/29/2018    Class: Chronic  . Spondylolisthesis, lumbar region 05/27/2018    Class: Chronic  . Spinal stenosis of lumbar region 05/27/2018    Class: Chronic  . Urinary tract infection 05/27/2018    Class: Acute  . Fusion of spine of lumbar region 05/27/2018  . Pain of right hip joint 07/03/2017  . Acute right-sided low back pain with right-sided sciatica 03/27/2017  . Chronic right shoulder pain 02/13/2017  . History of rotator cuff tear 11/30/2016  . Impingement syndrome of right  shoulder 11/30/2016  . Exacerbation of asthma 07/18/2016  . Hypokalemia 07/18/2016  . Hypertension 07/18/2016  . Depression with anxiety 07/18/2016  . Hypothyroidism 07/18/2016  . Hyperglycemia 07/18/2016  . Acute asthma exacerbation 07/18/2016  . Surgical wound dehiscence left hip; questionable superficial infection 11/10/2015  . Left hip postoperative wound infection 11/10/2015  . Osteoarthritis of left hip 09/09/2015  . 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 Obesity (BMI 35.0-39.9 without comorbidity) 04/28/2013       Past Medical History:  Diagnosis Date  . Anemia    taking iron now. pt states having no current issues 09/02/2015  . Anginal pain (HCC)    pt states experiences chest wall pain pt states related to her asthma   . Anxiety    with MRI's  . Arthritis    Everywhere  . Asthma   . Dizziness   . GERD (gastroesophageal reflux disease)   . Headache(784.0)    HX OF MIGRAINES  . History of bronchitis   . Hypertension   . Hypothyroidism    takes levothyroxen  . OSA on CPAP   . Shortness of breath    with exertion  . Wears glasses          Family History  Problem Relation Age of Onset  . Cancer Mother        colon  . Epilepsy Mother   . Cancer Father        prostate  . Diabetes Father   . Hypertension Father   . Hypertension Maternal Aunt   . Diabetes Maternal Aunt   . Hypertension Paternal Aunt          Past Surgical History:  Procedure Laterality Date  . CESAREAN SECTION     times 2  . CHOLECYSTECTOMY    . ENDOMETRIAL ABLATION    . INCISION AND DRAINAGE HIP Left 11/10/2015   Procedure: IRRIGATION AND DEBRIDEMENT LEFT HIP INCISION;  Surgeon: Kathryne Hitchhristopher Y Blackman, MD;  Location: [facility] OR;  Service: Orthopedics;  Laterality: Left;  . JOINT REPLACEMENT  2011   total left knee  . KNEE  ARTHROPLASTY  04/23/2012   right   . KNEE ARTHROSCOPY    . ROTATOR CUFF REPAIR     left   . SHOULDER  SURGERY     right to repair ligament tear  . TOTAL HIP ARTHROPLASTY Left 09/09/2015   Procedure: LEFT TOTAL HIP ARTHROPLASTY ANTERIOR APPROACH;  Surgeon: Kathryne Hitch, MD;  Location: [facility] OR[facility];  Service: Orthopedics;  Laterality: Left;  . TOTAL KNEE ARTHROPLASTY  04/23/2012   Procedure: TOTAL KNEE ARTHROPLASTY;  Surgeon: [name], MD;  Location: [facility] OR;  Service: Orthopedics;  Laterality: Right;  Right Total Knee Arthroplasty  . TUBAL LIGATION  1996   Social History        Occupational History  . Not on file  Tobacco Use  . Smoking status: Former Smoker    Packs/day: 0.50    Years: 4.00    Pack years: 2.00    Last attempt to quit: 09/18/1991    Years since quitting: 26.7  . Smokeless tobacco: Never Used  Substance and Sexual Activity  . Alcohol use: No  . Drug use: No  . Sexual activity: Yes    Birth control/protection: Surgical          Instructions      Return if symptoms worsen or fail to improve.

## 2018-06-11 NOTE — Progress Notes (Signed)
Received call from OR that they are sending transport for Pt. Called and gave report to [facility] from anesthesia. Pt is a hard stick, no IV access at the moment, 3 attempts made by IV team but still unsuccessful. Blood works and culture ordered but unsuccessful in getting sample. Received call from OR team to send Pt to OR without IV access. Endorsed accordingly.

## 2018-06-11 NOTE — Discharge Instructions (Addendum)
° ° °  Call if there is increasing drainage, fever greater than 101.5, severe head aches, and worsening nausea or light sensitivity. If shortness of breath, bloody cough or chest tightness or pain go to an emergency room. No lifting greater than 10 lbs. Avoid bending, stooping and twisting. Use brace when sitting and out of bed even to go to bathroom. Walk in house for first 2 weeks then may start to get out slowly increasing distances up to one quarter mile by 4-6 weeks post op. Keep VAC intact and HHN will change VAC weekly use an moisture impervious dressing. Please call and return for scheduled follow up appointment 1 weeks from the time of surgery. You have a PICCU line in your left arm and this requires periodic irrigation with a blood thinner to keep open please obey the instructions by  Nursing concerning the sterility and care of this important IV access line. The line allows for your use of antibiotics IV for 6 weeks without more Needle and IV sticks and allows for blood to be drawn rather than obtained with more needle sticks.  Call our office to arrange for a weekly visit to have the VAC removed and wet to dry dressing temporarily placed until [facility] can change the dressing later in the Day at home following your office visit.  Make an appointment to see Dr. Ninetta Lights infectious disease for follow up appointment in 4 weeks. HHN will help direct your IV antibiotic treatment and VAC changes.

## 2018-06-11 NOTE — Brief Op Note (Signed)
06/11/2018  10:31 PM  PATIENT:  Marilyn [name]  47 y.o. female  PRE-OPERATIVE DIAGNOSIS:  wound infection  POST-OPERATIVE DIAGNOSIS: Subcutaneous hematoma with areas of fat necrosis and purulence. Aspiration of right ankle negative for purulence, less than one cc of clear synovial fluid.   PROCEDURE:  Procedure(s): LUMBAR WOUND DEBRIDEMENT with aspiration of right ankle (N/A) ANKLE ARTHROSCOPY lavage (Right) Application of wound VAC  SURGEON:  Surgeon(s) and Role:    Kerrin Champagne, MD - Primary   ANESTHESIA:   general , Dr. Judie Petit EBL:  100CC   BLOOD ADMINISTERED:none  DRAINS: VAC to 125 mmHG suction   LOCAL MEDICATIONS USED:  NONE  SPECIMEN:  Source of Specimen:  lumbar hematoma cavity swab for C&S and gram stain.  DISPOSITION OF SPECIMEN:  N/A  COUNTS:  YES  TOURNIQUET:  * Missing tourniquet times found for documented tourniquets in log: [id number] *  DICTATION: .Dragon Dictation  PLAN OF CARE: Admit to inpatient   PATIENT DISPOSITION:  PACU - hemodynamically stable.   Delay start of Pharmacological VTE agent (>24hrs) due to surgical blood loss or risk of bleeding: no

## 2018-06-11 NOTE — Anesthesia Procedure Notes (Signed)
Procedure Name: Intubation Date/Time: 06/11/2018 9:16 PM Performed by: Myna Bright, CRNA Pre-anesthesia Checklist: Patient identified, Emergency Drugs available, Suction available and Patient being monitored Patient Re-evaluated:Patient Re-evaluated prior to induction Oxygen Delivery Method: Circle system utilized Preoxygenation: Pre-oxygenation with 100% oxygen Induction Type: IV induction, Rapid sequence and Cricoid Pressure applied Laryngoscope Size: Mac and 3 Grade View: Grade I Tube type: Oral Tube size: 7.0 mm Number of attempts: 1 Airway Equipment and Method: Stylet Placement Confirmation: ETT inserted through vocal cords under direct vision,  positive ETCO2 and breath sounds checked- equal and bilateral Secured at: 21 cm Tube secured with: Tape Dental Injury: Teeth and Oropharynx as per pre-operative assessment

## 2018-06-11 NOTE — Transfer of Care (Signed)
Immediate Anesthesia Transfer of Care Note  Patient: Marilyn [name]  Procedure(s) Performed: LUMBAR WOUND DEBRIDEMENT with aspiration of right ankle (N/A ) ANKLE ARTHROSCOPY lavage (Right )  Patient Location: PACU  Anesthesia Type:General  Level of Consciousness: awake, alert , oriented and patient cooperative  Airway & Oxygen Therapy: Patient Spontanous Breathing and Patient connected to nasal cannula oxygen  Post-op Assessment: Report given to RN, Post -op Vital signs reviewed and stable and Patient moving all extremities  Post vital signs: Reviewed and stable  Last Vitals:  Vitals Value Taken Time  BP    Temp    Pulse    Resp    SpO2      Last Pain:  Vitals:   06/11/18 1800  TempSrc:   PainSc: 0-No pain         Complications: No apparent anesthesia complications

## 2018-06-11 NOTE — Progress Notes (Signed)
Pt's surgeon has requested to have 18g PIV inserted---- attempts to find suitable vein with [place] unsuccessful.  Primary RN was notified of pt's vein condition.  Primary RN was advised by surgeon to take pt down to the OR at this time and OR Team will insert PIV.

## 2018-06-11 NOTE — Plan of Care (Signed)

## 2018-06-11 NOTE — Anesthesia Postprocedure Evaluation (Signed)
Anesthesia Post Note  Patient: Marilyn [name]  Procedure(s) Performed: LUMBAR WOUND DEBRIDEMENT (N/A )     Patient location during evaluation: PACU Anesthesia Type: General Level of consciousness: awake Pain management: pain level controlled Vital Signs Assessment: post-procedure vital signs reviewed and stable Respiratory status: spontaneous breathing Cardiovascular status: stable Postop Assessment: no apparent nausea or vomiting Anesthetic complications: no    Last Vitals:  Vitals:   06/11/18 1517 06/11/18 2235  BP: (!) 160/108 (!) 167/92  Pulse: (!) 108 98  Resp:  (!) 24  Temp: 37.1 C (!) 36.2 C  SpO2: 100% 99%    Last Pain:  Vitals:   06/11/18 2235  TempSrc:   PainSc: 10-Worst pain ever                 Tayshawn Purnell

## 2018-06-11 NOTE — Op Note (Signed)
06/11/2018  10:37 PM  PATIENT:  Marilyn [name]  47 y.o. female  MRN: [id number]  OPERATIVE REPORT  PRE-OPERATIVE DIAGNOSIS:  wound infection  POST-OPERATIVE DIAGNOSIS:Lumbar Subcutaneous hematoma with areas of fat necrosis and minimal purulence, lumbodorsal fascia intact  PROCEDURE:  Procedure(s): LUMBAR WOUND DEBRIDEMENT with aspiration of right ankle ANKLE ARTHROSCOPY lavage    SURGEON:  Jessy Oto, MD     ANESTHESIA:  General, Dr. Finis Bud    COMPLICATIONS:  None.     DRAINS: Wound VAC to the lumbar spine to 125 mmHg vacuum  PROCEDURE: The patient was met in the holding area, and the appropriate lumbar area identified not marked with an "X" and my initials as it represents midline structure with obvious drainage open wound. The right anterior ankle was marked with a marking pen with my initials and an "X" The patient was then transported to OR, then placed under  general anesthesia without difficulty. The right anteromedial ankle then prepped with betadiene solution. The tibialis anterioris identified and the soft spot representing the anteromedial ankle joint line marked just medial to the AT prior to prepped. An 79 G needle then placed into the anteromedial aspect of the right ankle joint. Aspiration disclosed no purulent drainage, clear fluid less than one cc that was normal viscosity. No specimen was sent as the aspiration was negative for infected arthritis right ankle. The patient was placed on the operative table with Wilson frame,  all pressure areas well padded, the arms at 90 9D at the side in a prone position position. The patient had received no preoperative antibiotics. Lumbar spine from the lower dorsal spine to the sacrum was then prepped using sterile conditions and Betadine scrub and Betadine prep and draped using sterile technique.  Time-out procedure was called and correct.  Using headlight and loupe magnification the initial incisionin the  upper third of the six-inch incision scar in this patient's lumbar spine extending from about L4 to S1. Incision through the old incision scar and a carried down along the subcutaneous layers by entering into a seroma fluid cavity at about 2-1/2-3 inches deep. Subcutaneous sutures were removed the drainage found was seroma-like fluid that was serosanguineous. Palpating deep the lumbodorsal fascia was found to be intact however the seroma cavity continued distally the entire length of the incision so that the entire incision scar was then excised with a border of about 2 or 3 mm on each side of the patient's previous wound edges. This down to the subcutaneous levels and all suture material was then excised using hemostats and palpable. The deeper seroma cavity was then returned to the cultures were sent for both for Gram stain and anaerobic and aerobic culture and sensitivities. Cavity evaluated all the way down to the lumbodorsal fascia which showed no openings and her to be sutured intact. There was no drainage through the lumbodorsal fascia so that this layer was left intact. Irrigation was then carried out using 500 mL of normal saline solution and then a double antibiotic bug juice of 250 mL was used to irrigate the incision site. The edges showed good bleeding tissue throughout no definite purulence was noted however a significant seroma cavity that extended from L3-S1. The sponge then for the [facility] was carefully fashioned and placed into the open wound site one third of the entire material A 3" x 5" sponge was able to be placed into the open wound site the edges circumferentially about the back incision site  was then carefully dried and  the Vi-Drape like material for the [facility] wound dressing applied. Central portions of the dressing overlying the sponge were then cut out and the exiting tube then applied to this area 125 mmHg negative pressure applied which showed excellent drawing down of the soft tissues with  no sign of air leakage. Patient then was carefully returned to her bed reactivated and extubated by anesthesia and then to the recovery room in satisfactory condition. The edges of the incision were debrided and this was an excisional debridement measuring about 1 cm thick by a depth of about 3 cm and the length of nearly 15 cm.  SPECIMEN: Swab sent of the seroma cavity the subcutaneous layers of the skin were sent for back and aerobic culture and sensitivity.      Basil Dess  06/11/2018, 10:37 PM

## 2018-06-11 NOTE — Progress Notes (Signed)
Three doses of 50 mcg of IV fentanyl (see MAR) given with no pain relief.  Dr. Otelia Sergeant at bedside and made aware.  Floor orders for 5mg  IR oxycodone and 1mg  IV dilaudid released and administered.  Patient now resting comfortably and VSS.  Will continue to monitor.

## 2018-06-12 ENCOUNTER — Other Ambulatory Visit: Payer: Self-pay

## 2018-06-12 ENCOUNTER — Encounter (HOSPITAL_COMMUNITY): Payer: Self-pay | Admitting: Specialist

## 2018-06-12 DIAGNOSIS — T8149XA Infection following a procedure, other surgical site, initial encounter: Secondary | ICD-10-CM | POA: Diagnosis present

## 2018-06-12 LAB — BASIC METABOLIC PANEL
Anion gap: 9 (ref 5–15)
BUN: 5 mg/dL — AB (ref 6–20)
CO2: 28 mmol/L (ref 22–32)
Calcium: 9.2 mg/dL (ref 8.9–10.3)
Chloride: 100 mmol/L (ref 98–111)
Creatinine, Ser: 1 mg/dL (ref 0.44–1.00)
GFR calc Af Amer: >60 mL/min (ref >60)
GFR calc non Af Amer: >60 mL/min (ref >60)
GLUCOSE: 109 mg/dL — AB (ref 70–99)
POTASSIUM: 3.7 mmol/L (ref 3.5–5.1)
Sodium: 137 mmol/L (ref 135–145)

## 2018-06-12 LAB — POCT I-STAT 4, (NA,K, GLUC, HGB,HCT)
GLUCOSE: 96 mg/dL (ref 70–99)
HCT: 28 % — ABNORMAL LOW (ref 36.0–46.0)
Hemoglobin: 9.5 g/dL — ABNORMAL LOW (ref 12.0–15.0)
POTASSIUM: 3.5 mmol/L (ref 3.5–5.1)
Sodium: 138 mmol/L (ref 135–145)

## 2018-06-12 LAB — CBC
HCT: 26.2 % — ABNORMAL LOW (ref 36.0–46.0)
Hemoglobin: 8.1 g/dL — ABNORMAL LOW (ref 12.0–15.0)
MCH: 26 pg (ref 26.0–34.0)
MCHC: 30.9 g/dL (ref 30.0–36.0)
MCV: 84.2 fL (ref 78.0–100.0)
PLATELETS: 415 10*3/uL — AB (ref 150–400)
RBC: 3.11 MIL/uL — AB (ref 3.87–5.11)
RDW: 13.7 % (ref 11.5–15.5)
WBC: 6.9 10*3/uL (ref 4.0–10.5)

## 2018-06-12 LAB — SEDIMENTATION RATE: Sed Rate: 93 mm/hr — ABNORMAL HIGH (ref 0–22)

## 2018-06-12 LAB — C-REACTIVE PROTEIN: CRP: 6.4 mg/dL — ABNORMAL HIGH (ref <1.0)

## 2018-06-12 MED ORDER — FLEET ENEMA 7-19 GM/118ML RE ENEM: 1.0000 | ENEMA | Freq: Once | RECTAL | Status: DC | PRN

## 2018-06-12 MED ORDER — FERROUS GLUCONATE 324 (38 FE) MG PO TABS
324.0000 mg | ORAL_TABLET | Freq: Two times a day (BID) | ORAL | Status: DC
  Administered 2018-06-12 – 2018-06-19 (×16): 324 mg via ORAL
  Filled 2018-06-12 (×16): qty 1

## 2018-06-12 MED ORDER — ALBUTEROL SULFATE (2.5 MG/3ML) 0.083% IN NEBU
2.5000 mg | INHALATION_SOLUTION | Freq: Three times a day (TID) | RESPIRATORY_TRACT | Status: DC
  Administered 2018-06-12: 2.5 mg via RESPIRATORY_TRACT
  Filled 2018-06-12: qty 3

## 2018-06-12 MED ORDER — DIPHENHYDRAMINE HCL 25 MG PO CAPS: 25.0000 mg | ORAL_CAPSULE | Freq: Four times a day (QID) | ORAL | Status: DC | PRN

## 2018-06-12 MED ORDER — LORATADINE 10 MG PO TABS
10.0000 mg | ORAL_TABLET | Freq: Every day | ORAL | Status: DC
  Administered 2018-06-12 – 2018-06-19 (×8): 10 mg via ORAL
  Filled 2018-06-12 (×8): qty 1

## 2018-06-12 MED ORDER — PHENOL 1.4 % MT LIQD: 1.0000 | OROMUCOSAL | Status: DC | PRN

## 2018-06-12 MED ORDER — METOCLOPRAMIDE HCL 5 MG PO TABS
5.0000 mg | ORAL_TABLET | Freq: Three times a day (TID) | ORAL | Status: DC
  Administered 2018-06-12 – 2018-06-19 (×23): 5 mg via ORAL
  Filled 2018-06-12 (×23): qty 1

## 2018-06-12 MED ORDER — MENTHOL 3 MG MT LOZG: 1.0000 | LOZENGE | OROMUCOSAL | Status: DC | PRN

## 2018-06-12 MED ORDER — CYCLOBENZAPRINE HCL 10 MG PO TABS
10.0000 mg | ORAL_TABLET | Freq: Three times a day (TID) | ORAL | Status: DC | PRN
  Administered 2018-06-19: 10 mg via ORAL
  Filled 2018-06-12: qty 1

## 2018-06-12 MED ORDER — SODIUM CHLORIDE 0.9% FLUSH
3.0000 mL | Freq: Two times a day (BID) | INTRAVENOUS | Status: DC
  Administered 2018-06-13 – 2018-06-19 (×13): 3 mL via INTRAVENOUS

## 2018-06-12 MED ORDER — VANCOMYCIN HCL 10 G IV SOLR
1500.0000 mg | Freq: Two times a day (BID) | INTRAVENOUS | Status: DC
  Administered 2018-06-12 – 2018-06-13 (×3): 1500 mg via INTRAVENOUS
  Filled 2018-06-12 (×3): qty 1500

## 2018-06-12 MED ORDER — POLYETHYLENE GLYCOL 3350 17 G PO PACK
17.0000 g | PACK | Freq: Every day | ORAL | Status: DC | PRN
  Administered 2018-06-13 – 2018-06-14 (×2): 17 g via ORAL
  Filled 2018-06-12 (×2): qty 1

## 2018-06-12 MED ORDER — BUPRENORPHINE HCL-NALOXONE HCL 8-2 MG SL SUBL
1.0000 | SUBLINGUAL_TABLET | Freq: Two times a day (BID) | SUBLINGUAL | Status: AC
  Administered 2018-06-12 – 2018-06-17 (×12): 1 via SUBLINGUAL
  Filled 2018-06-12 (×12): qty 1

## 2018-06-12 MED ORDER — ACETAMINOPHEN 650 MG RE SUPP: 650.0000 mg | RECTAL | Status: DC | PRN

## 2018-06-12 MED ORDER — SODIUM CHLORIDE 0.9 % IV SOLN
INTRAVENOUS | Status: DC
  Administered 2018-06-12 – 2018-06-18 (×5): via INTRAVENOUS

## 2018-06-12 MED ORDER — ALBUTEROL SULFATE (2.5 MG/3ML) 0.083% IN NEBU
3.0000 mL | INHALATION_SOLUTION | Freq: Four times a day (QID) | RESPIRATORY_TRACT | Status: DC | PRN
  Administered 2018-06-14: 3 mL via RESPIRATORY_TRACT
  Filled 2018-06-12: qty 3

## 2018-06-12 MED ORDER — VITAMIN D (ERGOCALCIFEROL) 1.25 MG (50000 UNIT) PO CAPS
50000.0000 [IU] | ORAL_CAPSULE | ORAL | Status: DC
  Administered 2018-06-16: 50000 [IU] via ORAL
  Filled 2018-06-12: qty 1

## 2018-06-12 MED ORDER — ACETAMINOPHEN 325 MG PO TABS: 650.0000 mg | ORAL_TABLET | ORAL | Status: DC | PRN

## 2018-06-12 MED ORDER — FAMOTIDINE IN NACL 20-0.9 MG/50ML-% IV SOLN
20.0000 mg | Freq: Two times a day (BID) | INTRAVENOUS | Status: DC
  Administered 2018-06-12 – 2018-06-13 (×3): 20 mg via INTRAVENOUS
  Filled 2018-06-12 (×3): qty 50

## 2018-06-12 MED ORDER — LEVOTHYROXINE SODIUM 25 MCG PO TABS
137.0000 ug | ORAL_TABLET | Freq: Every day | ORAL | Status: DC
  Administered 2018-06-12 – 2018-06-19 (×8): 137 ug via ORAL
  Filled 2018-06-12 (×8): qty 1

## 2018-06-12 MED ORDER — DOCUSATE SODIUM 100 MG PO CAPS
100.0000 mg | ORAL_CAPSULE | Freq: Two times a day (BID) | ORAL | Status: DC
  Administered 2018-06-12 – 2018-06-19 (×16): 100 mg via ORAL
  Filled 2018-06-12 (×16): qty 1

## 2018-06-12 MED ORDER — OXYCODONE HCL 5 MG PO TABS
10.0000 mg | ORAL_TABLET | ORAL | Status: DC | PRN
  Administered 2018-06-13 – 2018-06-18 (×8): 10 mg via ORAL
  Filled 2018-06-12 (×8): qty 2

## 2018-06-12 MED ORDER — ALUM & MAG HYDROXIDE-SIMETH 200-200-20 MG/5ML PO SUSP: 30.0000 mL | Freq: Four times a day (QID) | ORAL | Status: DC | PRN

## 2018-06-12 MED ORDER — JUVEN PO PACK
1.0000 | PACK | Freq: Two times a day (BID) | ORAL | Status: DC
  Administered 2018-06-13 – 2018-06-19 (×13): 1 via ORAL
  Filled 2018-06-12 (×14): qty 1

## 2018-06-12 MED ORDER — FUROSEMIDE 20 MG PO TABS: 20.0000 mg | ORAL_TABLET | Freq: Every day | ORAL | Status: DC | PRN

## 2018-06-12 MED ORDER — ONDANSETRON 4 MG PO TBDP: 8.0000 mg | ORAL_TABLET | Freq: Three times a day (TID) | ORAL | Status: DC | PRN

## 2018-06-12 MED ORDER — ONDANSETRON HCL 4 MG PO TABS
4.0000 mg | ORAL_TABLET | Freq: Four times a day (QID) | ORAL | Status: DC | PRN
  Administered 2018-06-19: 4 mg via ORAL
  Filled 2018-06-12: qty 1

## 2018-06-12 MED ORDER — ONDANSETRON HCL 4 MG/2ML IJ SOLN
4.0000 mg | Freq: Four times a day (QID) | INTRAMUSCULAR | Status: DC | PRN
  Administered 2018-06-18 – 2018-06-19 (×2): 4 mg via INTRAVENOUS
  Filled 2018-06-12 (×2): qty 2

## 2018-06-12 MED ORDER — SODIUM CHLORIDE 0.9% FLUSH: 3.0000 mL | INTRAVENOUS | Status: DC | PRN

## 2018-06-12 MED ORDER — BISACODYL 5 MG PO TBEC
5.0000 mg | DELAYED_RELEASE_TABLET | Freq: Every day | ORAL | Status: DC | PRN
  Administered 2018-06-16: 5 mg via ORAL
  Filled 2018-06-12: qty 1

## 2018-06-12 MED ORDER — BUPRENORPHINE HCL-NALOXONE HCL 8-2 MG SL SUBL: 1.0000 | SUBLINGUAL_TABLET | Freq: Every day | SUBLINGUAL | Status: DC

## 2018-06-12 MED ORDER — SODIUM CHLORIDE 0.9 % IV SOLN: 250.0000 mL | INTRAVENOUS | Status: DC

## 2018-06-12 MED ORDER — HYDROCHLOROTHIAZIDE 25 MG PO TABS
25.0000 mg | ORAL_TABLET | Freq: Every day | ORAL | Status: DC
  Administered 2018-06-12 – 2018-06-19 (×8): 25 mg via ORAL
  Filled 2018-06-12 (×8): qty 1

## 2018-06-12 MED ORDER — NALOXONE HCL 4 MG/0.1ML NA LIQD: 0.4000 mg | Freq: Once | NASAL | Status: DC

## 2018-06-12 NOTE — Evaluation (Signed)
Occupational Therapy Evaluation Patient Details Name: Marilyn [name] MRN: [id number] DOB: [birth year] Today's Date: 06/12/2018    History of Present Illness 47 y.o. female admitted on 06/11/18 for drainage from her lumbar surgical incision site.  Pt had a TLIF L4-S1 on 05/27/18.  Pt dx with infection and s/p I and D and wound vac placement on 06/11/18.  Also, pt with painful and swollen R ankle (with no know trauma), so surgeon aspirated the ankle during surgery.  Pt with other significant PMH of HTN, HA, dizziness, arthritis, anemia, asthma, bil TKA, L THA with infection and I and D.     Clinical Impression   Pt remains highly motivated from previous admission, known to this therapist from that admission. She is min guard assist for transfers from elevated surfaces, mod to max A for LB ADL without AE, and set up for UB ADL. TOday she was min A for donning brace due to pain. Pt will benefit from skilled OT in the acute setting, but anticipate ability to return home and resume [place] home services. OT will follow acutely and next session will practice getting back INTO bed (this is what she states is the hardest for her at home.    Follow Up Recommendations  Other (comment);Home health OT;Supervision/Assistance - 24 hour(resume [facility])    Equipment Recommendations  None recommended by OT(Pt has appropriate DME)    Recommendations for Other Services       Precautions / Restrictions Precautions Precautions: Back Precaution Booklet Issued: No Precaution Comments: reviewed 3/3 back precautions Required Braces or Orthoses: Spinal Brace Spinal Brace: Lumbar corset;Applied in sitting position Restrictions Weight Bearing Restrictions: No      Mobility Bed Mobility Overal bed mobility: Needs Assistance Bed Mobility: Rolling;Sidelying to Sit Rolling: Mod assist(use of pad to assist with hips in rolling) Sidelying to sit: Min guard;HOB elevated(use of bed rail to assist  with trunk elevation)       General bed mobility comments: Pt states that getting back INTO bed is what she has struggled with the most at home  Transfers Overall transfer level: Needs assistance Equipment used: Rolling walker (2 wheeled) Transfers: Sit to/from Stand Sit to Stand: Min assist         General transfer comment: Min assist to support trunk to power up from EOB, slow painful transitions, safe hand placement without cues.     Balance Overall balance assessment: Needs assistance Sitting-balance support: Feet supported;Bilateral upper extremity supported Sitting balance-Leahy Scale: Fair     Standing balance support: Bilateral upper extremity supported Standing balance-Leahy Scale: Poor Standing balance comment: relies on the RW in standing for support.                            ADL either performed or assessed with clinical judgement   ADL Overall ADL's : Needs assistance/impaired Eating/Feeding: Set up;Sitting   Grooming: Wash/dry hands;Oral care;Set up;Sitting Grooming Details (indicate cue type and reason): Pt unable to maintain standing for consecutive grooming tasks Upper Body Bathing: Set up;Supervision/ safety;Sitting;With adaptive equipment Upper Body Bathing Details (indicate cue type and reason): with long handle sponge Lower Body Bathing: Moderate assistance;Min guard;With adaptive equipment;Sitting/lateral leans   Upper Body Dressing : Minimal assistance;Sitting Upper Body Dressing Details (indicate cue type and reason): to don brace Lower Body Dressing: Maximal assistance;Sitting/lateral leans Lower Body Dressing Details (indicate cue type and reason): to don socks this session Toilet Transfer: Minimal assistance;Stand-pivot;BSC;RW;Requires wide/bariatric Toilet  Transfer Details (indicate cue type and reason): simulated through recliner transfer         Functional mobility during ADLs: Min guard;Rolling walker General ADL Comments:  Pt presenting with decreased ROM for LB ADLs. Pt highly motivated to return to PLOF and home.      Vision         Perception     Praxis      Pertinent Vitals/Pain Pain Assessment: Faces Faces Pain Scale: Hurts even more Pain Location: low back and R ankle Pain Descriptors / Indicators: Grimacing;Guarding Pain Intervention(s): Limited activity within patient's tolerance;Monitored during session;Repositioned     Hand Dominance Right   Extremity/Trunk Assessment Upper Extremity Assessment Upper Extremity Assessment: Overall WFL for tasks assessed   Lower Extremity Assessment Lower Extremity Assessment: RLE deficits/detail RLE Deficits / Details: R ankle swollen, encouraged elevation   Cervical / Trunk Assessment Cervical / Trunk Assessment: Other exceptions Cervical / Trunk Exceptions: post op lumbar surgery   Communication Communication Communication: No difficulties   Cognition Arousal/Alertness: Awake/alert Behavior During Therapy: WFL for tasks assessed/performed Overall Cognitive Status: Within Functional Limits for tasks assessed                                     General Comments       Exercises     Shoulder Instructions      Home Living Family/patient expects to be discharged to:: Private residence Living Arrangements: Parent Available Help at Discharge: Family;Available 24 hours/day Type of Home: House Home Access: Ramped entrance     Home Layout: One level     Bathroom Shower/Tub: Tub/shower unit;Curtain   Firefighter: Standard     Home Equipment: Cane - single point;Bedside commode;Shower seat;Adaptive equipment Adaptive Equipment: Long-handled sponge        Prior Functioning/Environment Level of Independence: Independent with assistive device(s)        Comments: Still using the RW for gait, active with HH therapies and has a bath aide.         OT Problem List: Decreased range of motion;Decreased activity  tolerance;Impaired balance (sitting and/or standing);Decreased knowledge of use of DME or AE;Decreased knowledge of precautions;Pain      OT Treatment/Interventions: Self-care/ADL training;Therapeutic exercise;Energy conservation;DME and/or AE instruction;Therapeutic activities;Patient/family education    OT Goals(Current goals can be found in the care plan section) Acute Rehab OT Goals Patient Stated Goal: to get back to normal and get this infection out OT Goal Formulation: With patient Time For Goal Achievement: 06/26/18 Potential to Achieve Goals: Good ADL Goals Pt Will Perform Grooming: with supervision;standing Pt Will Perform Lower Body Bathing: with supervision;sitting/lateral leans;with adaptive equipment Pt Will Perform Upper Body Dressing: with modified independence;sitting Pt Will Perform Lower Body Dressing: with set-up;with adaptive equipment;sit to/from stand Pt Will Transfer to Toilet: ambulating;with supervision;regular height toilet Pt Will Perform Toileting - Clothing Manipulation and hygiene: with supervision;with adaptive equipment;sit to/from stand Additional ADL Goal #1: Pt will perform bed mobility at supervision level AFTER an ADL activity (getting into supine position)  OT Frequency: Min 2X/week   Barriers to D/C:            Co-evaluation              AM-PAC PT "6 Clicks" Daily Activity     Outcome Measure Help from another person eating meals?: None Help from another person taking care of personal grooming?: None(in sitting) Help from another  person toileting, which includes using toliet, bedpan, or urinal?: A Lot Help from another person bathing (including washing, rinsing, drying)?: A Lot Help from another person to put on and taking off regular upper body clothing?: A Little Help from another person to put on and taking off regular lower body clothing?: A Lot 6 Click Score: 17   End of Session Equipment Utilized During Treatment: Back  brace;Rolling walker Nurse Communication: Mobility status;Precautions  Activity Tolerance: Patient tolerated treatment well Patient left: in chair;with call bell/phone within reach  OT Visit Diagnosis: Unsteadiness on feet (R26.81);Other abnormalities of gait and mobility (R26.89);Muscle weakness (generalized) (M62.81);Pain Pain - Right/Left: Right Pain - part of body: Ankle and joints of foot(back)                Time: 1454-1530 OT Time Calculation (min): 36 min Charges:  OT General Charges $OT Visit: 1 Visit OT Evaluation $OT Eval Moderate Complexity: 1 Mod OT Treatments $Self Care/Home Management : 8-22 mins  Sherryl Manges OTR/L Acute Rehabilitation Services Pager: [phone] Office: [phone]  Evern Bio Monque Haggar 06/12/2018, 5:13 PM

## 2018-06-12 NOTE — Plan of Care (Signed)

## 2018-06-12 NOTE — Progress Notes (Addendum)
Noted entered in error. Please see full assessment for further details.

## 2018-06-12 NOTE — Evaluation (Signed)
Physical Therapy Evaluation Patient Details Name: Marilyn [name] MRN: [id number] DOB: [birth year] Today's Date: 06/12/2018   History of Present Illness  47 y.o. female admitted on 06/11/18 for drainage from her lumbar surgical incision site.  Pt had a TLIF L4-S1 on 05/27/18.  Pt dx with infection and s/p I and D and wound vac placement on 06/11/18.  Also, pt with painful and swollen R ankle (with no know trauma), so surgeon aspirated the ankle during surgery.  Pt with other significant PMH of HTN, HA, dizziness, arthritis, anemia, asthma, bil TKA, L THA with infection and I and D.    Clinical Impression  Pt is mobilizing well, ambulating a short distance in her room with min guard assist and RW.  She is limited by both low back pain and R ankle pain.  She will likely progress well enough to d/c back home with her father and resume [facility] services that were in place PTA.   PT to follow acutely for deficits listed below.     Follow Up Recommendations Home health PT;Supervision for mobility/OOB(resume [facility] services)    Equipment Recommendations  None recommended by PT    Recommendations for Other Services   NA     Precautions / Restrictions Precautions Precautions: Back Precaution Booklet Issued: No Precaution Comments: reviewed 3/3 back precautions Required Braces or Orthoses: Spinal Brace Spinal Brace: Lumbar corset;Applied in sitting position Restrictions Weight Bearing Restrictions: No      Mobility  Bed Mobility               General bed mobility comments: Pt was OOB in the recliner chair.   Transfers Overall transfer level: Needs assistance Equipment used: Rolling walker (2 wheeled) Transfers: Sit to/from Stand Sit to Stand: Min assist         General transfer comment: Min assist to support trunk to power up from recliner chair, slow painful transitions, safe hand placement without cues.   Ambulation/Gait Ambulation/Gait assistance: Min guard Gait Distance  (Feet): 15 Feet Assistive device: Rolling walker (2 wheeled) Gait Pattern/deviations: Step-through pattern;Antalgic     General Gait Details: Pt with moderately antalgic gait pattern favoring her R ankle due to pain, also reports low back pain and pulling in standing and with gait.  Reliance on RW for support.          Balance Overall balance assessment: Needs assistance Sitting-balance support: Feet supported;Bilateral upper extremity supported Sitting balance-Leahy Scale: Fair     Standing balance support: Bilateral upper extremity supported Standing balance-Leahy Scale: Poor Standing balance comment: relies on the RW in standing for support.                              Pertinent Vitals/Pain Pain Assessment: Faces Faces Pain Scale: Hurts even more Pain Location: low back and R ankle Pain Descriptors / Indicators: Grimacing;Guarding Pain Intervention(s): Limited activity within patient's tolerance;Monitored during session;Repositioned;Ice applied(ice applied to ankle)    Home Living Family/patient expects to be discharged to:: Private residence Living Arrangements: Parent Available Help at Discharge: Family;Available 24 hours/day Type of Home: House Home Access: Ramped entrance     Home Layout: One level Home Equipment: Cane - single point;Bedside commode;Shower seat;Adaptive equipment      Prior Function Level of Independence: Independent with assistive device(s)         Comments: Still using the RW for gait, active with HH therapies and has a bath aide.  Hand Dominance   Dominant Hand: Right    Extremity/Trunk Assessment   Upper Extremity Assessment Upper Extremity Assessment: Defer to OT evaluation    Lower Extremity Assessment Lower Extremity Assessment: RLE deficits/detail RLE Deficits / Details: right ankle swollen in sock pattern (not gastroc) and generally tender to the touch (not point tender at a specific spot).  Encourage  frequent active movement of the toes and ankle in all directions and ice application for now (no heat) for swelling management and pain control.    Cervical / Trunk Assessment Cervical / Trunk Assessment: Other exceptions Cervical / Trunk Exceptions: post op lumbar surgery  Communication   Communication: No difficulties  Cognition Arousal/Alertness: Awake/alert Behavior During Therapy: WFL for tasks assessed/performed Overall Cognitive Status: Within Functional Limits for tasks assessed                                               Assessment/Plan    PT Assessment Patient needs continued PT services  PT Problem List Decreased strength;Decreased activity tolerance;Decreased balance;Decreased mobility;Decreased knowledge of use of DME;Pain;Decreased knowledge of precautions;Obesity       PT Treatment Interventions DME instruction;Gait training;Functional mobility training;Therapeutic activities;Therapeutic exercise;Balance training;Patient/family education;Modalities    PT Goals (Current goals can be found in the Care Plan section)  Acute Rehab PT Goals Patient Stated Goal: to get back to normal and get this infection out PT Goal Formulation: With patient Time For Goal Achievement: 06/19/18 Potential to Achieve Goals: Good    Frequency Min 5X/week           AM-PAC PT "6 Clicks" Daily Activity  Outcome Measure Difficulty turning over in bed (including adjusting bedclothes, sheets and blankets)?: Unable Difficulty moving from lying on back to sitting on the side of the bed? : Unable Difficulty sitting down on and standing up from a chair with arms (e.g., wheelchair, bedside commode, etc,.)?: Unable Help needed moving to and from a bed to chair (including a wheelchair)?: A Little Help needed walking in hospital room?: A Little Help needed climbing 3-5 steps with a railing? : A Little 6 Click Score: 12    End of Session Equipment Utilized During Treatment:  Back brace Activity Tolerance: Patient limited by pain Patient left: in chair;with call bell/phone within reach Nurse Communication: Mobility status PT Visit Diagnosis: Muscle weakness (generalized) (M62.81);Difficulty in walking, not elsewhere classified (R26.2);Pain Pain - Right/Left: Right(incisional) Pain - part of body: Ankle and joints of foot(back)    Time: 6045-4098 PT Time Calculation (min) (ACUTE ONLY): 31 min   Charges:          Lurena Joiner B. Vivion Romano, PT, DPT  Acute Rehabilitation [phone] pager #[phone] office   PT Evaluation $PT Eval Moderate Complexity: 1 Mod PT Treatments $Therapeutic Activity: 8-22 mins       06/12/2018, 4:34 PM

## 2018-06-12 NOTE — Progress Notes (Signed)
     Subjective: 1 Day Post-Op Procedure(s) (LRB): LUMBAR WOUND DEBRIDEMENT (N/A) Awake, alert and oriented x 4. Takes subloxone BID. Voiding without foley. Felt cold last night. Started on  Vancomycin and Zosyn last evening following sending Specimens for C&S from the OR. Ankle aspiration negative for purulence.   Patient reports pain as moderate.    Objective:   VITALS:  Temp:  [97.2 F (36.2 C)-98.8 F (37.1 C)] 98.7 F (37.1 C) (09/26 0639) Pulse Rate:  [76-108] 76 (09/26 0639) Resp:  [5-43] 14 (09/26 0639) BP: (118-167)/(58-108) 134/62 (09/26 0639) SpO2:  [95 %-100 %] 100 % (09/26 0639) Weight:  [172.4 kg] 172.4 kg (09/26 0440)  Neurologically intact ABD soft Neurovascular intact Sensation intact distally Intact pulses distally Dorsiflexion/Plantar flexion intact Incision: dressing C/D/I and VAC is functioning normally -125 mm HG.   LABS Recent Labs    06/11/18 2100 06/12/18 0402  HGB 8.4* 8.1*  WBC 7.1 6.9  PLT 433* 415*   Recent Labs    06/11/18 2100 06/12/18 0402  NA 135 137  K 3.3* 3.7  CL 97* 100  CO2 26 28  BUN 5* 5*  CREATININE 0.99 1.00  GLUCOSE 95 109*   Recent Labs    06/11/18 2100  INR 1.15     Assessment/Plan: 1 Day Post-Op Procedure(s) (LRB): LUMBAR WOUND DEBRIDEMENT (N/A) Anemia from previous surgery and yesterdays. Hematoma superficial, no deep sub fascial infection or drainage.   Advance diet Up with therapy Continue ABX therapy due to Post-op infection Cu;tures pending from OR.  Wound care team to assist with dressing change tomorrow. Vira Browns 06/12/2018, 8:36 AMPatient ID: Marilyn [name], female   DOB: December 02, 1970, 47 y.o.   MRN: [id number]

## 2018-06-12 NOTE — Progress Notes (Addendum)
Initial Nutrition Assessment  DOCUMENTATION CODES:   Morbid obesity  INTERVENTION:  Juven Fruit Punch BID, each serving provides 95kcal and 2.5g of protein (amino acids glutamine and arginine)  MVI with minerals daily   NUTRITION DIAGNOSIS:   Increased nutrient needs related to wound healing as evidenced by estimated needs.   GOAL:   Patient will meet greater than or equal to 90% of their needs   MONITOR:   PO intake, Supplement acceptance, Labs, Skin, I & O's, Weight trends  REASON FOR ASSESSMENT:   Malnutrition Screening Tool    ASSESSMENT:   Marilyn [name] is a 47 yo female with PMH of obesity, hypothyroidism, asthma exacerbation, s/p total hip replacement, hypertension, spondylolisthesis of lumbar region, and left hip postoperative wound infection admitted for superficial incisional surgical site infection.   Bed weight today was 138.1 kg, which is similar to weights recorded on admissions 6/17-9/11/19. Weight of 172.4 kg is most likely an inaccurate stated weight.   Body mass index is 53.93 kg/m. This patient meets criteria for morbid obesity based on current BMI. Pt reports no recent weight loss. Pt reports her appetite has been slightly decreased recently, eating 1-2 meals a day PTA. Per chart review, she had an acute stomach virus in 6/19; reports N/V issues have since resolved. She had previous constipation which is well-controlled now with Miralax.   Current diet order is regular. Patient is consuming 75-80% of meals at this time. Brief physical exam showed no signs of fat or muscle wasting.   Believe that patient will benefit from Juven supplement BID to help with healing of incision site infection.   Medications reviewed and include: Synthroid, Colace, Fergon, Reglan, Vit D 50,000 units/wk, Pepcid Labs reviewed: Hbg 8.1, CBG 103   NUTRITION - FOCUSED PHYSICAL EXAM:    Most Recent Value  Orbital Region  No depletion  Upper Arm Region  No depletion   Thoracic and Lumbar Region  No depletion  Buccal Region  No depletion  Temple Region  No depletion  Clavicle Bone Region  No depletion  Clavicle and Acromion Bone Region  No depletion  Scapular Bone Region  No depletion  Dorsal Hand  No depletion  Patellar Region  No depletion  Anterior Thigh Region  No depletion  Posterior Calf Region  No depletion  Edema (RD Assessment)  Unable to assess  Hair  Reviewed  Eyes  Reviewed  Mouth  Reviewed  Skin  Reviewed  Nails  Reviewed       Diet Order:   Diet Order            Diet regular Room service appropriate? Yes; Fluid consistency: Thin  Diet effective now              EDUCATION NEEDS:   No education needs have been identified at this time  Skin:  Skin Assessment: Skin Integrity Issues: Skin Integrity Issues:: Wound VAC Wound Vac: back  Last BM:   9/25  Height:   Ht Readings from Last 1 Encounters:  06/12/18 5\' 3"  (1.6 m)    Weight:   Wt Readings from Last 1 Encounters:  06/12/18 (!) 138.1 kg    Ideal Body Weight:  52.3 kg  BMI:  Body mass index is 53.93 kg/m.  Estimated Nutritional Needs:   Kcal:  1700-1900  Protein:  90-105  Fluid:  >/=1.7 L   Marilyn [name], Dietetic Intern [phone]

## 2018-06-12 NOTE — Progress Notes (Signed)
Pharmacy Antibiotic Note  Marilyn [name] is a 47 y.o. female admitted on 06/11/2018 with lumbar wound debridement.  Pharmacy has been consulted for Vancomycin dosing. A wound vac/drain is in place>>will continue vancomycin until DC'd by the MD. WBC WNL. Renal function good.   Plan: Vancomycin 1500 mg IV q12h Trend WBC, temp, renal function  F/U infectious work-up Drug levels as indicated  Temp (24hrs), Avg:97.8 F (36.6 C), Min:97.2 F (36.2 C), Max:98.8 F (37.1 C)  Recent Labs  Lab 06/11/18 2100  WBC 7.1  CREATININE 0.99    Estimated Creatinine Clearance: 108.9 mL/min (by C-G formula based on SCr of 0.99 mg/dL).    Allergies  Allergen Reactions  . Lisinopril Anaphylaxis  . Bee Venom Swelling and Other (See Comments)    On site swelling  . Propoxyphene Hives  . Codeine Nausea Only  . Latex Rash  . Meloxicam Diarrhea and Other (See Comments)    Insomnia, constipation  . Morphine And Related Rash  . Tomato Rash     Abran Duke 06/12/2018 12:27 AM

## 2018-06-13 ENCOUNTER — Inpatient Hospital Stay: Payer: Self-pay

## 2018-06-13 DIAGNOSIS — Z91030 Bee allergy status: Secondary | ICD-10-CM

## 2018-06-13 DIAGNOSIS — Z885 Allergy status to narcotic agent status: Secondary | ICD-10-CM

## 2018-06-13 DIAGNOSIS — Z886 Allergy status to analgesic agent status: Secondary | ICD-10-CM

## 2018-06-13 DIAGNOSIS — Z981 Arthrodesis status: Secondary | ICD-10-CM

## 2018-06-13 DIAGNOSIS — Z91018 Allergy to other foods: Secondary | ICD-10-CM

## 2018-06-13 DIAGNOSIS — Z91040 Latex allergy status: Secondary | ICD-10-CM

## 2018-06-13 DIAGNOSIS — T8149XA Infection following a procedure, other surgical site, initial encounter: Secondary | ICD-10-CM

## 2018-06-13 DIAGNOSIS — Z888 Allergy status to other drugs, medicaments and biological substances status: Secondary | ICD-10-CM

## 2018-06-13 DIAGNOSIS — Z87891 Personal history of nicotine dependence: Secondary | ICD-10-CM

## 2018-06-13 DIAGNOSIS — M96842 Postprocedural seroma of a musculoskeletal structure following a musculoskeletal system procedure: Secondary | ICD-10-CM

## 2018-06-13 DIAGNOSIS — Z96649 Presence of unspecified artificial hip joint: Secondary | ICD-10-CM

## 2018-06-13 DIAGNOSIS — T8142XA Infection following a procedure, deep incisional surgical site, initial encounter: Secondary | ICD-10-CM

## 2018-06-13 DIAGNOSIS — M25471 Effusion, right ankle: Secondary | ICD-10-CM

## 2018-06-13 DIAGNOSIS — Z978 Presence of other specified devices: Secondary | ICD-10-CM

## 2018-06-13 LAB — URINE CULTURE

## 2018-06-13 MED ORDER — FAMOTIDINE 20 MG PO TABS
20.0000 mg | ORAL_TABLET | Freq: Two times a day (BID) | ORAL | Status: DC
  Administered 2018-06-13 – 2018-06-19 (×13): 20 mg via ORAL
  Filled 2018-06-13 (×13): qty 1

## 2018-06-13 MED ORDER — SODIUM CHLORIDE 0.9 % IV SOLN
2.0000 g | Freq: Two times a day (BID) | INTRAVENOUS | Status: DC
  Administered 2018-06-13 – 2018-06-19 (×13): 2 g via INTRAVENOUS
  Filled 2018-06-13 (×14): qty 2

## 2018-06-13 MED ORDER — SODIUM CHLORIDE 0.9 % IV SOLN
1.0000 g | INTRAVENOUS | Status: DC
  Administered 2018-06-13: 1 g via INTRAVENOUS
  Filled 2018-06-13: qty 10

## 2018-06-13 NOTE — Consult Note (Signed)
WOC Nurse wound consult note Reason for Consult: Negative pressure (VAC) dressing change.  Dr Otelia Sergeant at bedside.  Wound type:surgical wound Pressure Injury POA: NA Measurement: 9 cm x 5 cm x 6 cm Wound bed: beefy red, friable Drainage (amount, consistency, odor) moderate serosanguinous  No odor Periwound:intact Dressing procedure/placement/frequency:Cleanse with NS.  1 piece black foam to wound bed. Cover with drape.  Attempt to bridge was unsuccessful due to body habitus.  Transitioned to trac pad over wound bed and seal immediately achieved. Change Mon/Wed/Fri WOC team will follow Marilyn Hudson MSN, RN, FNP-BC CWON Wound, Ostomy, Continence Nurse Pager [phone]

## 2018-06-13 NOTE — Care Management Important Message (Signed)
Important Message  Patient Details  Name: Marilyn [name] MRN: [id number] Date of Birth: [birth year]   Medicare Important Message Given:  Yes    Berry Gallacher Stefan Church 06/13/2018, 3:41 PM

## 2018-06-13 NOTE — Progress Notes (Signed)
Patient ID: Marilyn [name], female   DOB: Nov 29, 1970, 47 y.o.   MRN: [id number] Incision wound VAC changed, beefy red sanguineous appearance, no gross purulence. Gram stain with gram negative rods from OR incision specimen superficial hematoma, no leak from fascia.  ID consulted  I appreciate wound care and Dr. Moshe Cipro help with this patient.  Due to persistent pain in the right ankle and leg, negative previous venous U/S 9/22 at ER will obtain MRI right ankle.

## 2018-06-13 NOTE — Care Management Note (Signed)
Case Management Note  Patient Details  Name: Marilyn [name] MRN: [id number] Date of Birth: 23-May-1971  Subjective/Objective:    47 yr old female admitted with infection of lumbar wound, s/p I & D 06/11/18 with application of wound vac.  05/27/18 patient had L4-5, L5-S1 fusion                Action/Plan: patient was active with [facility], has DME. CM has contacted Lorenza Chick with [facility] and informed him of patient's admission and that she will need resumption of care at discharge. CM will continue to follow.    Expected Discharge Date:    pending              Expected Discharge Plan:  Home w Home Health Services  In-House Referral:     Discharge planning Services  CM Consult  Post Acute Care Choice:  Resumption of Svcs/PTA Provider Choice offered to:  Patient  DME Arranged:  N/A(has DME) DME Agency:  NA  HH Arranged:  PT HH Agency:  [facility]  Status of Service:  In process, will continue to follow  If discussed at Long Length of Stay Meetings, dates discussed:    Additional Comments:  Durenda Guthrie, RN 06/13/2018, 10:19 AM

## 2018-06-13 NOTE — Progress Notes (Signed)
PHARMACIST - PHYSICIAN COMMUNICATION  DR:   Otelia Sergeant  CONCERNING: IV to Oral Route Change Policy  RECOMMENDATION: This patient is receiving famotidine by the intravenous route.  Based on criteria approved by the Pharmacy and Therapeutics Committee, the intravenous medication(s) is/are being converted to the equivalent oral dose form(s).   DESCRIPTION: These criteria include:  The patient is eating (either orally or via tube) and/or has been taking other orally administered medications for a least 24 hours  The patient has no evidence of active gastrointestinal bleeding or impaired GI absorption (gastrectomy, short bowel, patient on TNA or NPO).  If you have questions about this conversion, please contact the Pharmacy Department  []   [phone] )  Marilyn [name] []   [phone] )  Marilyn [name] [x]   [phone] )  Marilyn [name] []   [phone] )  Marilyn [name] []   [phone] )  Marilyn [name]   Marilyn [name], [name] 06/13/2018 1:12 PM

## 2018-06-13 NOTE — Progress Notes (Signed)
     Subjective: 2 Days Post-Op Procedure(s) (LRB): LUMBAR WOUND DEBRIDEMENT (N/A) Awake, alert and oriented x 4. VAC to 125 mm Hg. Patient reports pain as moderate.    Objective:   VITALS:  Temp:  [97 F (36.1 C)-99.1 F (37.3 C)] 98.6 F (37 C) (09/27 0401) Pulse Rate:  [74-87] 87 (09/27 0401) Resp:  [16-18] 18 (09/27 0401) BP: (118-138)/(55-83) 118/58 (09/27 0401) SpO2:  [94 %-100 %] 100 % (09/27 0401) Weight:  [138.1 kg] 138.1 kg (09/26 1141)  Neurologically intact ABD soft Neurovascular intact Sensation intact distally Dorsiflexion/Plantar flexion intact Incision: moderate drainage and VAC to -125 mmHg negative pressure  Sed rate 91 CRP 6.1  Micro culture of incision site with gram negative rod. LABS Recent Labs    06/11/18 2053 06/11/18 2100 06/12/18 0402  HGB 9.5* 8.4* 8.1*  WBC  --  7.1 6.9  PLT  --  433* 415*   Recent Labs    06/11/18 2100 06/12/18 0402  NA 135 137  K 3.3* 3.7  CL 97* 100  CO2 26 28  BUN 5* 5*  CREATININE 0.99 1.00  GLUCOSE 95 109*   Recent Labs    06/11/18 2100  INR 1.15     Assessment/Plan: 2 Days Post-Op Procedure(s) (LRB): LUMBAR WOUND DEBRIDEMENT (N/A)  Advance diet Up with therapy Continue ABX therapy due to Post-op infection Wound and osteomy care contacted for dressing change today. ID consult for determination of best antibiotic treatment. Right ankle MRI to r/o stress fracture.   Vira Browns 06/13/2018, 8:43 AMPatient ID: Marilyn [name], female   DOB: [birth year], 47 y.o.   MRN: [id number]

## 2018-06-13 NOTE — Progress Notes (Signed)
Physical Therapy Treatment Patient Details Name: Marilyn [name] MRN: [id number] DOB: 1970-10-01 Today's Date: 06/13/2018    History of Present Illness 47 y.o. female admitted on 06/11/18 for drainage from her lumbar surgical incision site.  Pt had a TLIF L4-S1 on 05/27/18.  Pt dx with infection and s/p I and D and wound vac placement on 06/11/18.  Also, pt with painful and swollen R ankle (with no know trauma), so surgeon aspirated the ankle during surgery.  Pt with other significant PMH of HTN, HA, dizziness, arthritis, anemia, asthma, bil TKA, L THA with infection and I and D.      PT Comments    Pt is limited by both low back pain and R ankle pain. MRI of ankle not yet done, but ordered.  Ice applied to ankle after gait.  Pt remains mobile despite pain, but just needs a bit more assist than when she was just recently home.  PT will continue to follow acutely to progress safe mobility.    Follow Up Recommendations  Home health PT;Supervision for mobility/OOB(resume [facility] services at d/c)     Equipment Recommendations  None recommended by PT    Recommendations for Other Services   NA     Precautions / Restrictions Precautions Precautions: Back Required Braces or Orthoses: Spinal Brace;Other Brace/Splint Spinal Brace: Lumbar corset;Applied in sitting position Other Brace/Splint: wound vac    Mobility  Bed Mobility Overal bed mobility: Needs Assistance   Rolling: Min assist Sidelying to sit: Min assist       General bed mobility comments: Min assist to support trunk and hips to come to side lying and then to provide hand held assist to pull up to sitting EOB.    Transfers Overall transfer level: Needs assistance Equipment used: Rolling walker (2 wheeled) Transfers: Sit to/from Stand Sit to Stand: Min assist         General transfer comment: Min assist to support trunk during transitions  Ambulation/Gait Ambulation/Gait assistance: Min guard Gait Distance  (Feet): 65 Feet Assistive device: Rolling walker (2 wheeled) Gait Pattern/deviations: Step-through pattern;Antalgic     General Gait Details: Pt with moderately antalgic gait pattern favoring her right ankle.  Both back and ankle were equally bothering her during gait.           Balance Overall balance assessment: Needs assistance Sitting-balance support: Feet supported;No upper extremity supported Sitting balance-Leahy Scale: Fair     Standing balance support: Bilateral upper extremity supported Standing balance-Leahy Scale: Poor                              Cognition Arousal/Alertness: Awake/alert Behavior During Therapy: WFL for tasks assessed/performed Overall Cognitive Status: Within Functional Limits for tasks assessed                                               Pertinent Vitals/Pain Pain Assessment: Faces Faces Pain Scale: Hurts whole lot Pain Location: low back and R ankle Pain Descriptors / Indicators: Grimacing;Guarding Pain Intervention(s): Limited activity within patient's tolerance;Monitored during session;Repositioned;Patient requesting pain meds-RN notified;Ice applied(ice to R ankle)           PT Goals (current goals can now be found in the care plan section) Progress towards PT goals: Progressing toward goals    Frequency  Min 5X/week      PT Plan Current plan remains appropriate       AM-PAC PT "6 Clicks" Daily Activity  Outcome Measure  Difficulty turning over in bed (including adjusting bedclothes, sheets and blankets)?: Unable Difficulty moving from lying on back to sitting on the side of the bed? : Unable Difficulty sitting down on and standing up from a chair with arms (e.g., wheelchair, bedside commode, etc,.)?: Unable Help needed moving to and from a bed to chair (including a wheelchair)?: A Little Help needed walking in hospital room?: A Little Help needed climbing 3-5 steps with a railing? : A  Little 6 Click Score: 12    End of Session Equipment Utilized During Treatment: Back brace Activity Tolerance: Patient limited by pain Patient left: in chair;with call bell/phone within reach Nurse Communication: Patient requests pain meds PT Visit Diagnosis: Muscle weakness (generalized) (M62.81);Difficulty in walking, not elsewhere classified (R26.2);Pain Pain - Right/Left: Right(lower) Pain - part of body: Ankle and joints of foot(back)     Time: 6213-0865 PT Time Calculation (min) (ACUTE ONLY): 25 min  Charges:  $Gait Training: 8-22 mins $Therapeutic Activity: 8-22 mins          Verle Wheeling B. Tywon Niday, PT, DPT  Acute Rehabilitation [phone] pager #[phone] office            06/13/2018, 2:32 PM

## 2018-06-13 NOTE — Progress Notes (Signed)
RN spoke with MRI, they stated that pt should be able to get MRI of ankle done today but it will be late this evening. Pt informed.

## 2018-06-13 NOTE — Plan of Care (Signed)
  Problem: Activity: Goal: Risk for activity intolerance will decrease Outcome: Progressing   Problem: Pain Managment: Goal: General experience of comfort will improve Outcome: Progressing   Problem: Safety: Goal: Ability to remain free from injury will improve Outcome: Progressing   

## 2018-06-13 NOTE — Consult Note (Addendum)
[facility]    Date of Admission:  06/11/2018     Total days of antibiotics                Reason for Consult: Lumbar wound infection   Referring Provider: Otelia Sergeant Primary Care Provider: Joette Catching, MD   Assessment/Plan:  Marilyn [name] has a post-operative surgical wound infection from her most recent L4-L5 and L5 S1 TLIF lumbar fusion on 05/27/18. She is POD #2 and has remained afebrile and currently receiving vancomycin. Surgical gram stain with no organisms and culture showing gram negative rods. Continues to have serosangenous drainage in her VAC. Also continues to have pain and edema located in her right ankle of unclear source with Dr. Otelia Sergeant exploring possibility of stress fracture. MRI of the ankle is pending.   1. Will change her anbx to cefepime  2. Await MRI results for right ankle. 3. Her pain mgmt will be complicated with her hx of suboxone use (prescribed by her pain MD).  4. Will f/u over the weekend.  5. PIC line.    Principal Problem:   Superficial incisional surgical site infection Active Problems:   Right ankle effusion   Infected incision   . buprenorphine-naloxone  1 tablet Sublingual Q12H  . docusate sodium  100 mg Oral BID  . ferrous gluconate  324 mg Oral BID WC  . gabapentin  300 mg Oral TID  . hydrochlorothiazide  25 mg Oral Daily  . levothyroxine  137 mcg Oral QAC breakfast  . loratadine  10 mg Oral Daily  . metoCLOPramide  5 mg Oral TID AC  . nutrition supplement (JUVEN)  1 packet Oral BID BM  . oxyCODONE  10 mg Oral Q12H  . sodium chloride flush  3 mL Intravenous Q12H  . [START ON 06/16/2018] Vitamin D (Ergocalciferol)  50,000 Units Oral Q Mon     HPI: Marilyn [name] is a 47 y.o. female with previous medical history as detailed below and relevant for L4-L5 and L5 S1 TLIF lumbar fusion on 05/27/18 admitted for lumbar wound debridement and ankle arthroscopy.   Marilyn [name] was recently seen in the orthopedic  office for follow up from spinal surgery and noted to have her wound remaining opening and draining purulent fluid. She had no systemic symptoms. Venous dopplers completed were negative for DVT. Surgical notes report finding a seroma with serosanguineous fluid and no purulence noted. Cultures were obtained and a wound VAC was placed. Her ankle was aspirated with no purulent drainage with no specimens being sent for culture. She did not receive any pre-operative antibiotics.   Marilyn [name] has remained afebrile since admission. She is currently receiving vancomycin. Blood cultures have remained without growth to date. Surgical cultures with no organisms seen on gram stain and gram negative rods on culture.  Marilyn [name] symptoms started about 1.5 weeks post op when she noted increasing drainage from her back describing it is green in color. Around the same time she noted that her right ankle was becoming more painful and swollen, progressing to the point that she was unable to pressure on it. During this time she had no systemic symptoms of fevers, chills or sweats and was seen in the ED. She has had previous total hip replacement in 2016 which also developed wound infection.  Review of Systems: Review of Systems  Constitutional: Negative for chills, fever and malaise/fatigue.  Respiratory: Negative for cough, shortness of breath and wheezing.   Cardiovascular:  Negative for chest pain, palpitations and leg swelling.  Gastrointestinal: Negative for abdominal pain, constipation, diarrhea, nausea and vomiting.  Musculoskeletal: Positive for joint pain.       Positive for right ankle pain and edema.     Past Medical History:  Diagnosis Date  . Anemia    taking iron now. pt states having no current issues 09/02/2015  . Anginal pain (HCC)    pt states experiences chest wall pain pt states related to her asthma   . Anxiety    with MRI's  . Arthritis    Everywhere  . Asthma   . Dizziness   .  GERD (gastroesophageal reflux disease)   . Headache(784.0)    HX OF MIGRAINES  . History of bronchitis   . Hypertension   . Hypothyroidism    takes levothyroxen  . OSA on CPAP   . Shortness of breath    with exertion  . Wears glasses     Social History   Tobacco Use  . Smoking status: Former Smoker    Packs/day: 0.50    Years: 4.00    Pack years: 2.00    Last attempt to quit: 09/18/1991    Years since quitting: 26.7  . Smokeless tobacco: Never Used  Substance Use Topics  . Alcohol use: No  . Drug use: No    Family History  Problem Relation Age of Onset  . Cancer Mother        colon  . Epilepsy Mother   . Cancer Father        prostate  . Diabetes Father   . Hypertension Father   . Hypertension Maternal Aunt   . Diabetes Maternal Aunt   . Hypertension Paternal Aunt     Allergies  Allergen Reactions  . Lisinopril Anaphylaxis  . Bee Venom Swelling and Other (See Comments)    On site swelling  . Propoxyphene Hives  . Codeine Nausea Only  . Latex Rash  . Meloxicam Diarrhea and Other (See Comments)    Insomnia, constipation  . Morphine And Related Rash  . Tomato Rash    OBJECTIVE: Blood pressure (!) 118/58, pulse 87, temperature 98.6 F (37 C), temperature source Oral, resp. rate 18, height 5\' 3"  (1.6 m), weight (!) 138.1 kg, SpO2 100 %.  Physical Exam  Constitutional: She is oriented to person, place, and time. She appears well-developed and well-nourished. No distress.  Cardiovascular: Normal rate, regular rhythm, normal heart sounds and intact distal pulses. Exam reveals no gallop and no friction rub.  No murmur heard. Pulmonary/Chest: Effort normal and breath sounds normal. No stridor. No respiratory distress. She has no wheezes. She has no rales. She exhibits no tenderness.  Musculoskeletal:  Wound vac in place with serosanguinous drainage. Right ankle with edema and tenderness of over the anterior aspect with no crepitus or deformity.   Neurological:  She is alert and oriented to person, place, and time.  Skin: Skin is warm and dry.  Psychiatric: She has a normal mood and affect. Her behavior is normal. Judgment and thought content normal.  Media Information   Document Information   Photos    06/13/2018 14:24  Attached To:  Hospital Encounter on 06/11/18  Source Information   Ronda Rajkumar, Lacretia Leigh, MD  [facility]     Lab Results Lab Results  Component Value Date   WBC 6.9 06/12/2018   HGB 8.1 (L) 06/12/2018   HCT 26.2 (L) 06/12/2018   MCV 84.2 06/12/2018   PLT 415 (  H) 06/12/2018    Lab Results  Component Value Date   CREATININE 1.00 06/12/2018   BUN 5 (L) 06/12/2018   NA 137 06/12/2018   K 3.7 06/12/2018   CL 100 06/12/2018   CO2 28 06/12/2018    Lab Results  Component Value Date   ALT 13 06/11/2018   AST 22 06/11/2018   ALKPHOS 52 06/11/2018   BILITOT 0.6 06/11/2018     Microbiology: Recent Results (from the past 240 hour(s))  Culture, blood (routine x 2)     Status: None (Preliminary result)   Collection Time: 06/11/18  7:20 PM  Result Value Ref Range Status   Specimen Description BLOOD RIGHT ANTECUBITAL  Final   Special Requests   Final    BOTTLES DRAWN AEROBIC ONLY Blood Culture results may not be optimal due to an inadequate volume of blood received in culture bottles   Culture   Final    NO GROWTH 2 DAYS Performed at [facility], [address], [place], [place] [address]    Report Status PENDING  Incomplete  Urine culture     Status: None   Collection Time: 06/11/18  7:24 PM  Result Value Ref Range Status   Specimen Description URINE, CLEAN CATCH  Final   Special Requests   Final    NONE Performed at [facility], [address] [place], [place] [address]    Culture   Final    Multiple bacterial morphotypes present, none predominant. Suggest appropriate recollection if clinically indicated.   Report Status 06/13/2018 FINAL  Final  Aerobic/Anaerobic Culture (surgical/deep  wound)     Status: None (Preliminary result)   Collection Time: 06/11/18 10:15 PM  Result Value Ref Range Status   Specimen Description WOUND LUMBAR INCISION  Final   Special Requests PATIENT ON FOLLOWING VANC ZOSYN  Final   Gram Stain   Final    FEW WBC PRESENT,BOTH PMN AND MONONUCLEAR NO ORGANISMS SEEN Performed at [facility], [address] [place], [place] [address]    Culture RARE GRAM NEGATIVE RODS  Final   Report Status PENDING  Incomplete  Culture, blood (routine x 2)     Status: None (Preliminary result)   Collection Time: 06/12/18  4:01 AM  Result Value Ref Range Status   Specimen Description BLOOD LEFT ARM  Final   Special Requests   Final    BOTTLES DRAWN AEROBIC AND ANAEROBIC Blood Culture adequate volume   Culture   Final    NO GROWTH 1 DAY Performed at [facility], [address], [place], [place] [address]    Report Status PENDING  Incomplete     Marcos Eke, NP [facility] [phone] Pager  06/13/2018  10:45 AM

## 2018-06-14 ENCOUNTER — Inpatient Hospital Stay (HOSPITAL_COMMUNITY): Payer: Medicare Other

## 2018-06-14 NOTE — Progress Notes (Signed)
Physical Therapy Treatment Patient Details Name: Marilyn [name] MRN: [id number] DOB: 1971/01/15 Today's Date: 06/14/2018    History of Present Illness 47 y.o. female admitted on 06/11/18 for drainage from her lumbar surgical incision site.  Pt had a TLIF L4-S1 on 05/27/18.  Pt dx with infection and s/p I and D and wound vac placement on 06/11/18.  Also, pt with painful and swollen R ankle (with no know trauma), so surgeon aspirated the ankle during surgery.  Pt with other significant PMH of HTN, HA, dizziness, arthritis, anemia, asthma, bil TKA, L THA with infection and I and D.      PT Comments    Pt is progressing well despite unchanged R ankle pain and low back incisional pain.  She was able to push gait further and I added a compression wrap to ankle and ice at the end of our session to encourage edema management.  PT will continue to follow acutely for safe mobility progression    Follow Up Recommendations  Home health PT;Supervision for mobility/OOB(resume prior [facility] services)     Equipment Recommendations  None recommended by PT    Recommendations for Other Services   NA     Precautions / Restrictions Precautions Precautions: Back Required Braces or Orthoses: Spinal Brace;Other Brace/Splint Spinal Brace: Lumbar corset;Applied in sitting position Other Brace/Splint: wound vac    Mobility  Bed Mobility               General bed mobility comments: Pt was OOB in the recliner chair.   Transfers Overall transfer level: Needs assistance Equipment used: Rolling walker (2 wheeled) Transfers: Sit to/from Stand Sit to Stand: Min assist         General transfer comment: Min assist to power up to stand, verbal cues for safe hand placement.   Ambulation/Gait Ambulation/Gait assistance: Min guard Gait Distance (Feet): 85 Feet Assistive device: Rolling walker (2 wheeled) Gait Pattern/deviations: Step-to pattern;Antalgic;Trunk flexed Gait velocity: decreased Gait  velocity interpretation: 1.31 - 2.62 ft/sec, indicative of limited community ambulator General Gait Details: Pt with moderately antalgic gait pattern, ankle pain does not seem to have improved.  She did go to MRI.  RN spoke with MD who is covering today and he reports "degenerative changes" to her R ankle, but no bone infection or anything they can really do surgically for it at this time.  I told pt to ask Dr. Otelia Sergeant when he gets back his thoughts about a boot, but if she gets a boot that her family will need to bring her a shoe to level her out.            Balance Overall balance assessment: Needs assistance Sitting-balance support: Feet supported;No upper extremity supported Sitting balance-Leahy Scale: Good     Standing balance support: Bilateral upper extremity supported Standing balance-Leahy Scale: Poor                              Cognition Arousal/Alertness: Awake/alert Behavior During Therapy: WFL for tasks assessed/performed Overall Cognitive Status: Within Functional Limits for tasks assessed                                           General Comments General comments (skin integrity, edema, etc.): I wrapped pt ankle with an ace wrap and put a gel ice pack ontop of  that.  She has a good bit of swelling going all the way down to her toes, so I am hoping the compression and ice will help.       Pertinent Vitals/Pain Pain Assessment: Faces Faces Pain Scale: Hurts whole lot Pain Location: low back and R ankle Pain Descriptors / Indicators: Grimacing;Guarding Pain Intervention(s): Limited activity within patient's tolerance;Monitored during session;Repositioned;RN gave pain meds during session(pain meds at end of session)        PT Goals (current goals can now be found in the care plan section) Acute Rehab PT Goals Patient Stated Goal: to get back to normal and get this infection out Progress towards PT goals: Progressing toward goals     Frequency    Min 5X/week      PT Plan Current plan remains appropriate       AM-PAC PT "6 Clicks" Daily Activity  Outcome Measure  Difficulty turning over in bed (including adjusting bedclothes, sheets and blankets)?: Unable Difficulty moving from lying on back to sitting on the side of the bed? : Unable Difficulty sitting down on and standing up from a chair with arms (e.g., wheelchair, bedside commode, etc,.)?: Unable Help needed moving to and from a bed to chair (including a wheelchair)?: A Little Help needed walking in hospital room?: A Little Help needed climbing 3-5 steps with a railing? : A Little 6 Click Score: 12    End of Session Equipment Utilized During Treatment: Back brace Activity Tolerance: Patient limited by pain Patient left: in chair;with call bell/phone within reach;with nursing/sitter in room   PT Visit Diagnosis: Muscle weakness (generalized) (M62.81);Difficulty in walking, not elsewhere classified (R26.2);Pain Pain - Right/Left: Right(lower) Pain - part of body: Ankle and joints of foot(back)     Time: 1610-9604 PT Time Calculation (min) (ACUTE ONLY): 28 min  Charges:  $Gait Training: 23-37 mins          Taysen Bushart B. Marigene Erler, PT, DPT  Acute Rehabilitation [phone] pager #[phone] office            06/14/2018, 3:47 PM

## 2018-06-14 NOTE — Progress Notes (Addendum)
Pt to MRI  12:30 - Pt back from MRI

## 2018-06-14 NOTE — Progress Notes (Addendum)
Assessed BUE for PICC placement. All vessels too small for PICC placement at 45% occupancy or greater with the tourniquet on. Also unable to track vessel without many bifurcations.  RN notified.  Please refer PICC placement to IR. Dr Drue Second notified.

## 2018-06-14 NOTE — Progress Notes (Signed)
Subjective: Patient stable.  Pain controlled.  No MRI yet.  PICC line today.   Objective: Vital signs in last 24 hours: Temp:  [98.2 F (36.8 C)-98.8 F (37.1 C)] 98.2 F (36.8 C) (09/28 0523) Pulse Rate:  [92-97] 97 (09/28 0523) Resp:  [16-18] 16 (09/28 0523) BP: (116-121)/(65-67) 116/65 (09/28 0523) SpO2:  [96 %-98 %] 96 % (09/28 0523)  Intake/Output from previous day: 09/27 0701 - 09/28 0700 In: 4506.6 [P.O.:960; I.V.:3246.6; IV Piggyback:300] Out: 2900 [Urine:2850; Drains:50] Intake/Output this shift: No intake/output data recorded.  Exam:  Dorsiflexion/Plantar flexion intact  Labs: Recent Labs    06/11/18 2053 06/11/18 2100 06/12/18 0402  HGB 9.5* 8.4* 8.1*   Recent Labs    06/11/18 2100 06/12/18 0402  WBC 7.1 6.9  RBC 3.23* 3.11*  HCT 27.5* 26.2*  PLT 433* 415*   Recent Labs    06/11/18 2100 06/12/18 0402  NA 135 137  K 3.3* 3.7  CL 97* 100  CO2 26 28  BUN 5* 5*  CREATININE 0.99 1.00  GLUCOSE 95 109*  CALCIUM 9.4 9.2   Recent Labs    06/11/18 2100  INR 1.15    Assessment/Plan: Plan at this time is to keep the patient here to the weekend while the wound VAC in her back functions.  PICC line for long-term IV antibiotics.  MRI scan pending on extremity.   Marrianne Mood Claudene Gatliff 06/14/2018, 9:28 AM

## 2018-06-15 NOTE — Progress Notes (Signed)
Subjective: Patient stable.  Sitting in chair.  In good spirits.  MRI scan of the ankle demonstrates plantar fasciitis flexor tendinitis as well as talar dome lesion consistent with degenerative change.   Objective: Vital signs in last 24 hours: Temp:  [98.3 F (36.8 C)-98.8 F (37.1 C)] 98.8 F (37.1 C) (09/29 0620) Pulse Rate:  [79-89] 79 (09/29 0620) Resp:  [16-18] 16 (09/29 0620) BP: (117-129)/(59-87) 117/59 (09/29 0620) SpO2:  [98 %-100 %] 98 % (09/29 0620)  Intake/Output from previous day: 09/28 0701 - 09/29 0700 In: 900.5 [P.O.:360; I.V.:440.5; IV Piggyback:100] Out: 1700 [Urine:1700] Intake/Output this shift: Total I/O In: -  Out: 1125 [Urine:800; Drains:325]  Exam:  Dorsiflexion/Plantar flexion intact  Labs: No results for input(s): HGB in the last 72 hours. No results for input(s): WBC, RBC, HCT, PLT in the last 72 hours. No results for input(s): NA, K, CL, CO2, BUN, CREATININE, GLUCOSE, CALCIUM in the last 72 hours. No results for input(s): LABPT, INR in the last 72 hours.  Assessment/Plan: Plan at this time is CAM Walker per patient request.  PICC line placement unsuccessful yesterday.  May need central line for long-term antibiotics.   G Scott Dean 06/15/2018, 10:10 AM

## 2018-06-15 NOTE — Progress Notes (Signed)
Orthopedic Tech Progress Note Patient Details:  Marilyn [name] December 23, 1970 [id number]  Ortho Devices Type of Ortho Device: CAM walker Ortho Device/Splint Location: rle Ortho Device/Splint Interventions: Application   Post Interventions Patient Tolerated: Well Instructions Provided: Care of device   Nikki Dom 06/15/2018, 10:34 AM

## 2018-06-16 DIAGNOSIS — M899 Disorder of bone, unspecified: Secondary | ICD-10-CM | POA: Diagnosis present

## 2018-06-16 DIAGNOSIS — M722 Plantar fascial fibromatosis: Secondary | ICD-10-CM | POA: Diagnosis present

## 2018-06-16 DIAGNOSIS — G0491 Myelitis, unspecified: Secondary | ICD-10-CM

## 2018-06-16 DIAGNOSIS — B965 Pseudomonas (aeruginosa) (mallei) (pseudomallei) as the cause of diseases classified elsewhere: Secondary | ICD-10-CM

## 2018-06-16 DIAGNOSIS — M949 Disorder of cartilage, unspecified: Secondary | ICD-10-CM

## 2018-06-16 DIAGNOSIS — T8141XD Infection following a procedure, superficial incisional surgical site, subsequent encounter: Secondary | ICD-10-CM

## 2018-06-16 DIAGNOSIS — M7661 Achilles tendinitis, right leg: Secondary | ICD-10-CM | POA: Diagnosis present

## 2018-06-16 LAB — CBC WITH DIFFERENTIAL/PLATELET
ABS IMMATURE GRANULOCYTES: 0 10*3/uL (ref 0.0–0.1)
Basophils Absolute: 0 10*3/uL (ref 0.0–0.1)
Basophils Relative: 0 %
Eosinophils Absolute: 0.6 10*3/uL (ref 0.0–0.7)
Eosinophils Relative: 11 %
HEMATOCRIT: 26.2 % — AB (ref 36.0–46.0)
Hemoglobin: 7.9 g/dL — ABNORMAL LOW (ref 12.0–15.0)
IMMATURE GRANULOCYTES: 1 %
LYMPHS ABS: 1.3 10*3/uL (ref 0.7–4.0)
Lymphocytes Relative: 22 %
MCH: 25.4 pg — ABNORMAL LOW (ref 26.0–34.0)
MCHC: 30.2 g/dL (ref 30.0–36.0)
MCV: 84.2 fL (ref 78.0–100.0)
MONOS PCT: 7 %
Monocytes Absolute: 0.4 10*3/uL (ref 0.1–1.0)
NEUTROS ABS: 3.3 10*3/uL (ref 1.7–7.7)
NEUTROS PCT: 59 %
PLATELETS: 355 10*3/uL (ref 150–400)
RBC: 3.11 MIL/uL — AB (ref 3.87–5.11)
RDW: 14 % (ref 11.5–15.5)
WBC: 5.7 10*3/uL (ref 4.0–10.5)

## 2018-06-16 LAB — C-REACTIVE PROTEIN: CRP: 7.5 mg/dL — ABNORMAL HIGH (ref <1.0)

## 2018-06-16 LAB — CULTURE, BLOOD (ROUTINE X 2): Culture: NO GROWTH

## 2018-06-16 LAB — URIC ACID: URIC ACID, SERUM: 8.7 mg/dL — AB (ref 2.5–7.1)

## 2018-06-16 MED ORDER — DICLOFENAC SODIUM 1 % TD GEL
4.0000 g | Freq: Four times a day (QID) | TRANSDERMAL | Status: DC
  Administered 2018-06-16 – 2018-06-19 (×14): 4 g via TOPICAL
  Filled 2018-06-16: qty 100

## 2018-06-16 MED ORDER — ASPIRIN EC 325 MG PO TBEC
325.0000 mg | DELAYED_RELEASE_TABLET | Freq: Every day | ORAL | Status: DC
  Administered 2018-06-16 – 2018-06-18 (×3): 325 mg via ORAL
  Filled 2018-06-16 (×3): qty 1

## 2018-06-16 MED ORDER — ALLOPURINOL 100 MG PO TABS
100.0000 mg | ORAL_TABLET | Freq: Two times a day (BID) | ORAL | Status: DC
  Administered 2018-06-17 – 2018-06-19 (×7): 100 mg via ORAL
  Filled 2018-06-16 (×7): qty 1

## 2018-06-16 NOTE — Progress Notes (Signed)
Patient ID: Marilyn [name], female   DOB: 10-Feb-1971, 47 y.o.   MRN: [id number] Uric acid level is elevated 8.7  Start allopurinol 100mg  Bid.

## 2018-06-16 NOTE — Progress Notes (Signed)
   Patient Status: [facility] - In-pt  Assessment and Plan: Patient in need of venous access.   Peripherally inserted central catheter vs tunneled central catheter placement ______________________________________________________________________   History of Present Illness: Marilyn [name] is a 47 y.o. female   Recent lumbar spine surgery 05/27/2018 New possible ankle infection Need for long term IV antibiotics IV team examined pt-- veins too small Request IR to placed PICC vs tunneled central catheter placement  Allergies and medications reviewed.   Review of Systems: A 12 point ROS discussed and pertinent positives are indicated in the HPI above.  All other systems are negative.   Vital Signs: BP 127/76 (BP Location: Right Arm)   Pulse 88   Temp 98.6 F (37 C) (Oral)   Resp 16   Ht 5\' 3"  (1.6 m)   Wt (!) 304 lb 7.3 oz (138.1 kg)   SpO2 99%   BMI 53.93 kg/m   Physical Exam  Cardiovascular: Normal rate and regular rhythm.  Pulmonary/Chest: Effort normal and breath sounds normal.  Musculoskeletal: Normal range of motion.  Neurological: She is alert.  Skin: Skin is warm and dry.  Psychiatric: She has a normal mood and affect. Her behavior is normal.  Vitals reviewed.    Imaging reviewed.   Labs:  COAGS: Recent Labs    05/23/18 1034 06/11/18 2100  INR 1.03 1.15  APTT 37* 43*    BMP: Recent Labs    05/28/18 0355 05/29/18 0309 06/11/18 2053 06/11/18 2100 06/12/18 0402  NA 139 136 138 135 137  K 3.8 3.6 3.5 3.3* 3.7  CL 102 100  --  97* 100  CO2 27 29  --  26 28  GLUCOSE 119* 111* 96 95 109*  BUN 8 11  --  5* 5*  CALCIUM 8.9 8.2*  --  9.4 9.2  CREATININE 1.11* 1.14*  --  0.99 1.00  GFRNONAA 57* 56*  --  >60 >60  GFRAA >60 >60  --  >60 >60   Scheduled for peripherally inserted central catheter placement vs tunneled central catheter placement Pt is aware of procedure benefits and risks Including but not limited to Infection; bleeding; vessel  damage Agreeable to proceed Consent signed and in chart    Electronically Signed: Dearius Hoffmann A, PA-C 06/16/2018, 10:38 AM   I spent a total of 15 minutes in face to face in clinical consultation, greater than 50% of which was counseling/coordinating care for venous access.Patient ID: Marilyn [name], female   DOB: April 13, 1971, 47 y.o.   MRN: [id number]

## 2018-06-16 NOTE — Progress Notes (Signed)
Occupational Therapy Treatment Patient Details Name: Marilyn [name] MRN: [id number] DOB: 04/28/1971 Today's Date: 06/16/2018    History of present illness 47 y.o. female admitted on 06/11/18 for drainage from her lumbar surgical incision site.  Pt had a TLIF L4-S1 on 05/27/18.  Pt dx with infection and s/p I and D and wound vac placement on 06/11/18.  Also, pt with painful and swollen R ankle (with no know trauma), so surgeon aspirated the ankle during surgery.  Pt with other significant PMH of HTN, HA, dizziness, arthritis, anemia, asthma, bil TKA, L THA with infection and I and D.     OT comments  Pt progressing towards established OT goals. Pt performing toileting with Min A for safety and balance. Reviewing compensatory techniques for toilet hygiene. Pt performing hand hygiene and oral care at sink with Min Guard A for safety. Pt continues to be motivated to return to PLOF and home. Continue to recommend dc to home with HHOT (resuming [facility]) and will continue to follow acutely as admitted.    Follow Up Recommendations  Other (comment);Home health OT;Supervision/Assistance - 24 hour(resume [facility])    Equipment Recommendations  None recommended by OT(Pt has appropriate DME)    Recommendations for Other Services      Precautions / Restrictions Precautions Precautions: Back Precaution Booklet Issued: No Precaution Comments: reviewed 3/3 back precautions Required Braces or Orthoses: Spinal Brace;Other Brace/Splint Spinal Brace: Lumbar corset;Applied in sitting position Other Brace/Splint: wound vac Restrictions Weight Bearing Restrictions: No       Mobility Bed Mobility               General bed mobility comments: Pt was OOB in the recliner chair.   Transfers Overall transfer level: Needs assistance Equipment used: Rolling walker (2 wheeled) Transfers: Sit to/from Stand Sit to Stand: Min assist         General transfer comment: Min assist to  power up to stand, verbal cues for safe hand placement.     Balance Overall balance assessment: Needs assistance Sitting-balance support: Feet supported;No upper extremity supported Sitting balance-Leahy Scale: Good     Standing balance support: Bilateral upper extremity supported Standing balance-Leahy Scale: Poor Standing balance comment: relies on the RW in standing for support.                            ADL either performed or assessed with clinical judgement   ADL Overall ADL's : Needs assistance/impaired     Grooming: Wash/dry hands;Min guard;Standing Grooming Details (indicate cue type and reason): Min Guard A for safety during hand hygiene at sink. Pt performing oral care at sink with MIn GUard A for safety. Cues for using second cup to rinse and sip with.         Upper Body Dressing : Min guard;Sitting Upper Body Dressing Details (indicate cue type and reason): donning brace with cues for sequencing      Toilet Transfer: Minimal assistance;Ambulation;RW;BSC Toilet Transfer Details (indicate cue type and reason): Min A for initial power up into standing from [facility]. Pt demonstrating good technique.  Toileting- Clothing Manipulation and Hygiene: Minimal assistance;Sit to/from stand Toileting - Clothing Manipulation Details (indicate cue type and reason): MIn A for standing balance. Providing education on toilet hygiene and compensatory techniques. Pt performing peri care with Min cues for adherance to precautions.     Functional mobility during ADLs: Min guard;Rolling walker General ADL Comments: Pt continues to  present with decreased strength and coorindation. Motivated to particiapte in therapy.     Vision       Perception     Praxis      Cognition Arousal/Alertness: Awake/alert Behavior During Therapy: WFL for tasks assessed/performed Overall Cognitive Status: Impaired/Different from baseline Area of Impairment: Following commands                        Following Commands: Follows one step commands with increased time;Follows multi-step commands with increased time       General Comments: Pt following commands and presenting with decreased speech. Pt requiring increased time to complete a sentence and would perseverate on a certain word. Pt also with decreased following of commands and requiring increased cues. RN Elita Quick) present during session, and stating this is not her presentation yesterday.         Exercises     Shoulder Instructions       General Comments Rn (Pam) present during session    Pertinent Vitals/ Pain       Faces Pain Scale: Hurts whole lot Pain Location: low back and R ankle Pain Descriptors / Indicators: Grimacing;Guarding  Home Living                                          Prior Functioning/Environment              Frequency  Min 2X/week        Progress Toward Goals  OT Goals(current goals can now be found in the care plan section)  Progress towards OT goals: Progressing toward goals  Acute Rehab OT Goals Patient Stated Goal: to get back to normal and get this infection out OT Goal Formulation: With patient Time For Goal Achievement: 06/26/18 Potential to Achieve Goals: Good ADL Goals Pt Will Perform Grooming: with supervision;standing Pt Will Perform Lower Body Bathing: with supervision;sitting/lateral leans;with adaptive equipment Pt Will Perform Upper Body Dressing: with modified independence;sitting Pt Will Perform Lower Body Dressing: with set-up;with adaptive equipment;sit to/from stand Pt Will Transfer to Toilet: ambulating;with supervision;regular height toilet Pt Will Perform Toileting - Clothing Manipulation and hygiene: with supervision;with adaptive equipment;sit to/from stand Pt Will Perform Tub/Shower Transfer: Tub transfer;with set-up;with supervision;ambulating;shower seat;rolling walker Additional ADL Goal #1: Pt will perform bed mobility  at supervision level AFTER an ADL activity (getting into supine position)  Plan Discharge plan remains appropriate;Frequency remains appropriate    Co-evaluation                 AM-PAC PT "6 Clicks" Daily Activity     Outcome Measure   Help from another person eating meals?: None Help from another person taking care of personal grooming?: None(in sitting) Help from another person toileting, which includes using toliet, bedpan, or urinal?: A Lot Help from another person bathing (including washing, rinsing, drying)?: A Lot Help from another person to put on and taking off regular upper body clothing?: A Little Help from another person to put on and taking off regular lower body clothing?: A Lot 6 Click Score: 17    End of Session Equipment Utilized During Treatment: Back brace;Rolling walker  OT Visit Diagnosis: Unsteadiness on feet (R26.81);Other abnormalities of gait and mobility (R26.89);Muscle weakness (generalized) (M62.81);Pain Pain - Right/Left: Right Pain - part of body: Ankle and joints of foot(back)   Activity Tolerance Patient tolerated treatment well  Patient Left in chair;with call bell/phone within reach   Nurse Communication Mobility status;Precautions        Time: 1610-9604 OT Time Calculation (min): 23 min  Charges: OT General Charges $OT Visit: 1 Visit OT Treatments $Self Care/Home Management : 23-37 mins  Adisen Bennion MSOT, OTR/L Acute Rehab Pager: [phone] Office: [phone]   Theodoro Grist Cole [place] 06/16/2018, 2:52 PM

## 2018-06-16 NOTE — Consult Note (Signed)
WOC Nurse wound consult note VAC dressing changed in [facility] 5N14.  No family present.  Patient had received IV meds for pain prior to beginning VAC dressing change.  After dressing change was completed, I contacted her primary RN, Tobi Bastos, to let her know about the patient's pain and ask if there was anything additional she could have at that time. Reason for Consult: Lumbar spine surgical VAC dressing change. One piece of black foam removed, one piece of black foam placed.  Obtained an immediate seal.   Wound bed: Three of four wound surfaces were dark pink/red/bleeding.  The patient's right side of the incision contained mostly golden globules, I am assuming were adipose tissue. Drainage (amount, consistency, odor) Bright red, bloody. Periwound: Intact, normal color and texture. Dressing procedure/placement/frequency: MWF VAC dressing change by WOC team.   At the beginning of the dressing change, the VAC pump was found hanging from the top of the IV pole.  The power cord was not connected.  The pump had exhausted it's battery supply.  The power cord was reconnected, and the pump was functioning properly at the conclusion of the dressing change.  Emotional support was provided to the patient.  Helmut Muster, RN, MSN, CWOCN, CNS-BC, pager [phone]

## 2018-06-16 NOTE — Progress Notes (Signed)
[facility] for Infectious Disease  Date of Admission:  06/11/2018   Total days of antibiotics 4 cefepime                 Patient ID: Marilyn [name] is a 47 y.o. female with  Principal Problem:   Superficial incisional surgical site infection Active Problems:   Right ankle effusion   Infected incision   Deep incisional surgical site infection   . buprenorphine-naloxone  1 tablet Sublingual Q12H  . docusate sodium  100 mg Oral BID  . famotidine  20 mg Oral BID  . ferrous gluconate  324 mg Oral BID WC  . gabapentin  300 mg Oral TID  . hydrochlorothiazide  25 mg Oral Daily  . levothyroxine  137 mcg Oral QAC breakfast  . loratadine  10 mg Oral Daily  . metoCLOPramide  5 mg Oral TID AC  . nutrition supplement (JUVEN)  1 packet Oral BID BM  . oxyCODONE  10 mg Oral Q12H  . sodium chloride flush  3 mL Intravenous Q12H  . Vitamin D (Ergocalciferol)  50,000 Units Oral Q Mon    SUBJECTIVE: C/o feeling poorly, back pain  Review of Systems: Review of Systems  Constitutional: Negative for chills and fever.  Musculoskeletal: Positive for back pain.    Allergies  Allergen Reactions  . Lisinopril Anaphylaxis  . Bee Venom Swelling and Other (See Comments)    On site swelling  . Propoxyphene Hives  . Codeine Nausea Only  . Latex Rash  . Meloxicam Diarrhea and Other (See Comments)    Insomnia, constipation  . Morphine And Related Rash  . Tomato Rash    OBJECTIVE: Vitals:   06/15/18 0620 06/15/18 1513 06/15/18 2005 06/16/18 0523  BP: (!) 117/59 (!) 112/52 122/77 127/76  Pulse: 79 81 65 88  Resp: 16 16    Temp: 98.8 F (37.1 C) 98.4 F (36.9 C) 98.6 F (37 C) 98.6 F (37 C)  TempSrc: Oral Oral Oral Oral  SpO2: 98% 100% 94% 99%  Weight:      Height:       Body mass index is 53.93 kg/m.  Physical Exam  Constitutional: She appears well-developed and well-nourished.  Neck:      Lab Results Lab Results  Component Value Date   WBC 5.7  06/16/2018   HGB 7.9 (L) 06/16/2018   HCT 26.2 (L) 06/16/2018   MCV 84.2 06/16/2018   PLT 355 06/16/2018    Lab Results  Component Value Date   CREATININE 1.00 06/12/2018   BUN 5 (L) 06/12/2018   NA 137 06/12/2018   K 3.7 06/12/2018   CL 100 06/12/2018   CO2 28 06/12/2018    Lab Results  Component Value Date   ALT 13 06/11/2018   AST 22 06/11/2018   ALKPHOS 52 06/11/2018   BILITOT 0.6 06/11/2018     Microbiology:   IMAGING:  R Ankle MR: IMPRESSION: 1. Mild-moderate tendinosis of the Achilles tendon without a tear. 2. Moderate plantar fasciitis of the medial band of the plantar fascia at the calcaneal insertion with a small partial-thickness tear. 3. 7 mm osteochondral lesion involving the lateral corner of the talar dome with subchondral marrow edema and partial-thickness overlying cartilage loss. 4. Small amount of fluid in the flexor digitorum, flexor hallucis longus and posterior tibial tendon sheaths as can be seen with Tenosynovitis.  Op Note:  Lumbar Subcutaneous hematoma with areas of fat necrosis and minimal  purulence, lumbodorsal fascia intact   ASSESSMENT:  Marilyn [name] is a 47 y.o. female with superficial post op surgical wound infection involving the lumbar spine (s/p 9/10 TLIF). She has pan-sensitive pseudomonas infection; apparently there has been some difficulty with placing a PICC line and may need IR to consult.   Will continue her on cefepime.  Would plan for her to go home on cefepime    Allergies  Allergen Reactions  . Lisinopril Anaphylaxis  . Bee Venom Swelling and Other (See Comments)    On site swelling  . Propoxyphene Hives  . Codeine Nausea Only  . Latex Rash  . Meloxicam Diarrhea and Other (See Comments)    Insomnia, constipation  . Morphine And Related Rash  . Tomato Rash    OPAT Orders Discharge antibiotics: cefepime 2g ivpb q12h  Duration: 42 days total  End Date: 07-24-18  [facility] Care Per Protocol: please  Labs  weekly while on IV antibiotics: _x_ CBC with differential __ BMP _x_ CMP _x_ CRP _x_ ESR __ Vancomycin trough __ CK  x__ Please pull PIC at completion of IV antibiotics __ Please leave PIC in place until doctor has seen patient or been notified  Fax weekly labs to [phone]  Clinic Follow Up Appt: 6 weeks  Janene Madeira, MSN, NP-C [facility] for Infectious [place] Cell: [phone] Pager: [phone]  06/16/2018  10:15 AM

## 2018-06-16 NOTE — Progress Notes (Signed)
     Subjective: 5 Days Post-Op Procedure(s) (LRB): LUMBAR WOUND DEBRIDEMENT (N/A) Awake, alert and oriented x 4. Incision wound VAC changed with wound and osteomy nursing care.  She has pain in excess but probably related to long  Term narcotic tolerance for long term exposure.  She had MRI of the right ankle this past weekend. Attempted PICCU line unsuccessful will consult interventional radiology. On maxipime 2 grams q 12 hours, day #5  Patient reports pain as marked.    Objective:   VITALS:  Temp:  [98.4 F (36.9 C)-98.6 F (37 C)] 98.6 F (37 C) (09/30 0523) Pulse Rate:  [65-88] 88 (09/30 0523) Resp:  [16] 16 (09/29 1513) BP: (112-127)/(52-77) 127/76 (09/30 0523) SpO2:  [94 %-100 %] 99 % (09/30 0523)  Neurologically intact ABD soft Neurovascular intact Sensation intact distally Intact pulses distally Dorsiflexion/Plantar flexion intact Incision: moderate drainage No cellulitis present Serosanguineous drainage, wound cultures with pseudomonas aeriginosa, blood cult neg, urine mixed colonies preop 9/10   LABS Recent Labs    06/16/18 0725  HGB 7.9*  WBC 5.7  PLT 355  CRP slightly elevated compared with POD#1 7.4 from 6.9 MRI with small amount of fluid flexor digitorum and flexor hallucis and posterior tibial tendon sheath consistent with tendinosis. Signal at the insertion site of medial plantar fascia band into the calcaneus. Mild signal within the achillis tendon consistent with achilles tendinosis. Minimal effusion left ankle with osteochondral lesion superolateral talar dome. No loose body, area of full thickness cartilage loss associated with the lesion likely due to DJD.   No results for input(s): NA, K, CL, CO2, BUN, CREATININE, GLUCOSE in the last 72 hours. No results for input(s): LABPT, INR in the last 72 hours.   Assessment/Plan: 5 Days Post-Op Procedure(s) (LRB): LUMBAR WOUND DEBRIDEMENT (N/A)  Advance diet Up with therapy Continue ABX therapy due  to Post-op infection  Will consult interventional radiology for PICCU line access. Check ANA and rheumatoid factor due to significant sed rate and multiple tendonosis of the right foot and leg.  Transdermal voltaren gel for right foot and ankle. Allow weight bearing as tolerated on the right ankle PT to eval and treat right ankle.   Vira Browns 06/16/2018, 10:06 AMPatient ID: Marilyn [name], female   DOB: 05/02/1971, 47 y.o.   MRN: [id number]

## 2018-06-16 NOTE — Progress Notes (Signed)
Subjective: Patient c/o pain this morning.  Just had wound vac changed.  Wound care RN stated that wound is looking good.  PICC line not placed yet.    Objective: Vital signs in last 24 hours: Temp:  [98.4 F (36.9 C)-98.6 F (37 C)] 98.6 F (37 C) (09/30 0523) Pulse Rate:  [65-88] 88 (09/30 0523) Resp:  [16] 16 (09/29 1513) BP: (112-127)/(52-77) 127/76 (09/30 0523) SpO2:  [94 %-100 %] 99 % (09/30 0523)  Intake/Output from previous day: 09/29 0701 - 09/30 0700 In: 3 [I.V.:3] Out: 1775 [Urine:1450; Drains:325] Intake/Output this shift: No intake/output data recorded.  Recent Labs    06/16/18 0725  HGB 7.9*   Recent Labs    06/16/18 0725  WBC 5.7  RBC 3.11*  HCT 26.2*  PLT 355   No results for input(s): NA, K, CL, CO2, BUN, CREATININE, GLUCOSE, CALCIUM in the last 72 hours. No results for input(s): LABPT, INR in the last 72 hours.  Exam: Alert and oriented. NAD.  Wound vac intact.  bilat calves nontender.     MRI Right Ankle EXAM: MRI OF THE RIGHT ANKLE WITHOUT CONTRAST  TECHNIQUE: Multiplanar, multisequence MR imaging of the ankle was performed. No intravenous contrast was administered.  COMPARISON:  None.  FINDINGS: TENDONS  Peroneal: Peroneal longus tendon intact. Peroneal brevis intact.  Posteromedial: Posterior tibial tendon intact. Flexor hallucis longus tendon intact. Flexor digitorum longus tendon intact. Small amount of fluid in the flexor digitorum, flexor hallucis longus and posterior tibial tendon sheaths as can be seen with tenosynovitis.  Anterior: Tibialis anterior tendon intact. Extensor hallucis longus tendon intact Extensor digitorum longus tendon intact.  Achilles: Mild-moderate tendinosis of the Achilles tendon without a tear.  Plantar Fascia: Thickening and increased signal of the medial band of the plantar fascia at the calcaneal insertion with a partial-thickness tear.  LIGAMENTS  Lateral: Anterior talofibular  ligament intact. Calcaneofibular ligament intact. Posterior talofibular ligament intact. Anterior and posterior tibiofibular ligaments intact.  Medial: Deltoid ligament intact. Spring ligament intact.  CARTILAGE  Ankle Joint: Moderate ankle joint effusion with synovitis 7 mm osteochondral lesion involving the lateral corner of the talar dome with subchondral marrow edema and partial-thickness overlying cartilage loss.  Subtalar Joints/Sinus Tarsi: Normal subtalar joints. No subtalar joint effusion. Normal sinus tarsi.  Bones: No marrow signal abnormality.  No fracture or dislocation.  Soft Tissue: No fluid collection or hematoma. No soft tissue mass. Soft tissue edema along the dorsal aspect of the foot and extending into the medial and lateral aspect of the ankle.  IMPRESSION: 1. Mild-moderate tendinosis of the Achilles tendon without a tear. 2. Moderate plantar fasciitis of the medial band of the plantar fascia at the calcaneal insertion with a small partial-thickness tear. 3. 7 mm osteochondral lesion involving the lateral corner of the talar dome with subchondral marrow edema and partial-thickness overlying cartilage loss. 4. Small amount of fluid in the flexor digitorum, flexor hallucis longus and posterior tibial tendon sheaths as can be seen with tenosynovitis.   Electronically Signed   By: Elige Ko   On: 06/14/2018 14:24   Assessment/Plan: Per Dr Otelia Sergeant see if IR can place PICC line today.  Reviewed right ankle MRI with patient.   ordered serum uric acid. May consider right ankle intra-articular marc/depo injection. Continue present care.    [name] 06/16/2018, 9:49 AM

## 2018-06-16 NOTE — Progress Notes (Addendum)
PT Cancellation Note  Patient Details Name: Marilyn [name] MRN: [id number] DOB: 05/12/1971   Cancelled Treatment:    Reason Eval/Treat Not Completed: Other (comment)(Pt eating will return if time permits, will maintain to follow per POC.  )   Woodroe Vogan Artis Delay 06/16/2018, 5:45 PM Joycelyn Rua, PTA Acute Rehabilitation Services Pager [phone] Office [phone]

## 2018-06-16 NOTE — Progress Notes (Signed)
[facility]  [facility] Infusion Coordinator will follow pt with ID team to support home infusion pharmacy services at [place] as ordered. [facility] will work with [facility] Agency of choice as HH nursing provider.  If patient discharges after hours, please call [phone].   Sedalia Muta 06/16/2018, 8:39 AM

## 2018-06-16 NOTE — Progress Notes (Signed)
Physical Therapy Treatment Patient Details Name: Marilyn [name] MRN: [id number] DOB: [birth year] Today's Date: 06/16/2018    History of Present Illness 47 y.o. female admitted on 06/11/18 for drainage from her lumbar surgical incision site.  Pt had a TLIF L4-S1 on 05/27/18.  Pt dx with infection and s/p I and D and wound vac placement on 06/11/18.  Also, pt with painful and swollen R ankle (with no know trauma), so surgeon aspirated the ankle during surgery.  Pt with other significant PMH of HTN, HA, dizziness, arthritis, anemia, asthma, bil TKA, L THA with infection and I and D.      PT Comments    Pt limited secondary to pain and requesting to limit ambulation from chair to bed. Pt with slowed processing and requiring increased time to follow commands this session. Reviewed heel cord stretches and plantar fasciitis stretches. Will continue to follow acutely to maximize functional mobility independence and safety.    Follow Up Recommendations  Home health PT;Supervision for mobility/OOB     Equipment Recommendations  None recommended by PT    Recommendations for Other Services       Precautions / Restrictions Precautions Precautions: Back Precaution Booklet Issued: No Precaution Comments: reviewed 3/3 back precautions Required Braces or Orthoses: Spinal Brace;Other Brace/Splint Spinal Brace: Lumbar corset;Applied in sitting position Other Brace/Splint: wound vac; RLE CAM boot Restrictions Weight Bearing Restrictions: No    Mobility  Bed Mobility Overal bed mobility: Needs Assistance Bed Mobility: Rolling;Sit to Sidelying Rolling: Min guard       Sit to sidelying: Mod assist General bed mobility comments: Pt was OOB in the recliner chair upon entry. Required mod A to perform log roll technique.   Transfers Overall transfer level: Needs assistance Equipment used: Rolling walker (2 wheeled) Transfers: Sit to/from Stand Sit to Stand: Min assist         General  transfer comment: Min assist to power up to stand, verbal cues for safe hand placement.   Ambulation/Gait Ambulation/Gait assistance: Min guard Gait Distance (Feet): 5 Feet Assistive device: Rolling walker (2 wheeled) Gait Pattern/deviations: Step-to pattern;Antalgic;Trunk flexed Gait velocity: decreased   General Gait Details: Slow, guarded gait. Pt with increased pain, so requesting to return to bed from recliner. Verbal cues for sequencing wiht RW.    Stairs             Wheelchair Mobility    Modified Rankin (Stroke Patients Only)       Balance Overall balance assessment: Needs assistance Sitting-balance support: Feet supported;No upper extremity supported Sitting balance-Leahy Scale: Good     Standing balance support: Bilateral upper extremity supported Standing balance-Leahy Scale: Poor Standing balance comment: relies on the RW in standing for support.                             Cognition Arousal/Alertness: Awake/alert Behavior During Therapy: WFL for tasks assessed/performed Overall Cognitive Status: Impaired/Different from baseline Area of Impairment: Following commands                       Following Commands: Follows one step commands with increased time;Follows multi-step commands with increased time       General Comments: Pt following commands with increased time and very slow to move. Pt not answering questions when asked.       Exercises Other Exercises Other Exercises: Practiced heel cord stretches using sheet X 2 for 20 sec holds.  Other Exercises: Great toe stretches X2 for 20 sec holds to help with plantar fasciitis.     General Comments        Pertinent Vitals/Pain Pain Assessment: Faces Faces Pain Scale: Hurts whole lot Pain Location: low back and R ankle Pain Descriptors / Indicators: Grimacing;Guarding Pain Intervention(s): Limited activity within patient's tolerance;Monitored during session;Repositioned     Home Living                      Prior Function            PT Goals (current goals can now be found in the care plan section) Acute Rehab PT Goals Patient Stated Goal: to get back to normal and get this infection out PT Goal Formulation: With patient Time For Goal Achievement: 06/19/18 Potential to Achieve Goals: Good Progress towards PT goals: Progressing toward goals    Frequency    Min 5X/week      PT Plan Current plan remains appropriate    Co-evaluation              AM-PAC PT "6 Clicks" Daily Activity  Outcome Measure  Difficulty turning over in bed (including adjusting bedclothes, sheets and blankets)?: Unable Difficulty moving from lying on back to sitting on the side of the bed? : Unable Difficulty sitting down on and standing up from a chair with arms (e.g., wheelchair, bedside commode, etc,.)?: Unable Help needed moving to and from a bed to chair (including a wheelchair)?: A Little Help needed walking in hospital room?: A Little Help needed climbing 3-5 steps with a railing? : A Little 6 Click Score: 12    End of Session Equipment Utilized During Treatment: Gait belt Activity Tolerance: Patient limited by pain Patient left: in bed;with call bell/phone within reach Nurse Communication: Mobility status PT Visit Diagnosis: Muscle weakness (generalized) (M62.81);Difficulty in walking, not elsewhere classified (R26.2);Pain Pain - Right/Left: Right Pain - part of body: Ankle and joints of foot(back )     Time: 1610-9604 PT Time Calculation (min) (ACUTE ONLY): 21 min  Charges:  $Therapeutic Activity: 8-22 mins                     Gladys Damme, PT, DPT  Acute Rehabilitation Services  Pager: [phone] Office: [phone]    Lehman Prom 06/16/2018, 6:46 PM

## 2018-06-16 NOTE — Progress Notes (Signed)
PHARMACY CONSULT NOTE FOR:  OUTPATIENT  PARENTERAL ANTIBIOTIC THERAPY (OPAT)  Indication: Deep incisional surgical site infection Regimen: Cefepime 2g IV every 12 hours  End date: 07/24/18  IV antibiotic discharge orders are pended. To discharging provider:  please sign these orders via discharge navigator,  Select New Orders & click on the button choice - Manage This Unsigned Work.     Thank you for allowing pharmacy to be a part of this patient's care.  Gwynneth Albright, Ilda Basset D PGY1 Pharmacy Resident  Phone [phone] 06/16/2018   11:49 AM

## 2018-06-16 NOTE — Discharge Summary (Signed)
Physician Discharge Summary  Patient ID: Marilyn [name] MRN: [id number] DOB/AGE: Jan 06, 1971 47 y.o.  Admit date: 05/27/2018 Discharge date: 05/30/2018  Admission Diagnoses: lumbar spondylolisthesis and severe bilateral foraminal stenosis L4-5 and L5-S1 Past Medical History:  Diagnosis Date  . Anemia    taking iron now. pt states having no current issues 09/02/2015  . Anginal pain ([place])    pt states experiences chest wall pain pt states related to her asthma   . Anxiety    with MRI's  . Arthritis    Everywhere  . Asthma   . Dizziness   . GERD (gastroesophageal reflux disease)   . Headache(784.0)    HX OF MIGRAINES  . History of bronchitis   . Hypertension   . Hypothyroidism    takes levothyroxen  . OSA on CPAP   . Shortness of breath    with exertion  . Wears glasses     Discharge Diagnoses:  Principal Problem:   Spondylolisthesis, lumbar region Active Problems:   Spinal stenosis of lumbar region   Urinary tract infection   Morbid (severe) obesity due to excess calories ([facility]CC)   Fusion of spine of lumbar region   Surgeries: Procedure(s): TRANSFORAMINAL LUMBAR INTERBODY FUSION LEFT L4-5 AND L5-S1 WITH DEPUY MPACT SCREWS AND RODS, CONCORDE PROTI CAGES, LOCAL AND ALLOGRAFT BONE GRAFT, VIVIGEN on 05/27/2018    Consultants:   Discharged Condition: Improved  Hospital Course: Marilyn [name] is an 47 y.o. female who was admitted 05/27/2018 with a chief complaint of lumbar radiculopathy due to lumbar spinal stenosis due to spondylolisthesis L4-5 and L5-S1, morbid obesisty, and found to have a diagnosis of lumbar spondylolisthesis and severe bilateral foraminal stenosis L4-5 and L5-S1.  They were brought to the operating room on 05/27/2018 and underwent Procedure(s): TRANSFORAMINAL LUMBAR INTERBODY FUSION LEFT L4-5 AND L5-S1 WITH DEPUY MPACT SCREWS AND RODS, CONCORDE PROTI CAGES, LOCAL AND ALLOGRAFT BONE GRAFT, VIVIGEN.    They were given perioperative antibiotics:   Anti-infectives (From admission, onward)   Start     Dose/Rate Route Frequency Ordered Stop   05/28/18 0000  ceFAZolin (ANCEF) IVPB 2g/100 mL premix     2 g 200 mL/hr over 30 Minutes Intravenous Every 8 hours 05/27/18 2300 05/28/18 0950   05/27/18 2315  amoxicillin-clavulanate (AUGMENTIN) 500-125 MG per tablet 500 mg  Status:  Discontinued     1 tablet Oral 3 times daily 05/27/18 2300 05/27/18 2319   05/27/18 1200  ceFAZolin (ANCEF) 3 g in dextrose 5 % 50 mL IVPB  Status:  Discontinued     3 g 100 mL/hr over 30 Minutes Intravenous On call to O.R. 05/27/18 0957 05/27/18 2259   05/27/18 0715  ceFAZolin (ANCEF) 3 g in dextrose 5 % 50 mL IVPB     3 g 100 mL/hr over 30 Minutes Intravenous On call to O.R. 05/26/18 2202 05/27/18 1609    .  They were given sequential compression devices, early ambulation for DVT prophylaxis. Post operatively she began PT on POD#1 and continued throughout her stay making excellent progress. She was on subloxone pre hospital and remained on it during her admission with oxycodone 10 mg Q 12 hours and IR oxycodone 5-10 mg every 3-4 hours. Her foley was discontinued on POD#1. She began po nourishment and narcotics immediately post  Op. Her dressing was changed on POD#1and POD#2 and POD#3, There was no drainage but the dressing had a tendency to roll up. Her post operative Hgb decreased to 8.0 but rebounded with ferrous gluconate. She  did well with inpatient physical therapy and was discharged home on POD#3 taking and tolerating po narcotics and nourishment. She was ambulating at the time of discharge around the nursing station and had demonstrated ability to walk stairs. Her brace fitted on POD#1 and will be used at all times when out of bed. All questions were answered prior to her discharge.   Recent vital signs: No data found..  Recent laboratory studies: Mr Ankle Right Wo Contrast  Result Date: 06/14/2018 CLINICAL DATA:  Ankle pain, in right leg, ankle and foot  swelling. EXAM: MRI OF THE RIGHT ANKLE WITHOUT CONTRAST TECHNIQUE: Multiplanar, multisequence MR imaging of the ankle was performed. No intravenous contrast was administered. COMPARISON:  None. FINDINGS: TENDONS Peroneal: Peroneal longus tendon intact. Peroneal brevis intact. Posteromedial: Posterior tibial tendon intact. Flexor hallucis longus tendon intact. Flexor digitorum longus tendon intact. Small amount of fluid in the flexor digitorum, flexor hallucis longus and posterior tibial tendon sheaths as can be seen with tenosynovitis. Anterior: Tibialis anterior tendon intact. Extensor hallucis longus tendon intact Extensor digitorum longus tendon intact. Achilles: Mild-moderate tendinosis of the Achilles tendon without a tear. Plantar Fascia: Thickening and increased signal of the medial band of the plantar fascia at the calcaneal insertion with a partial-thickness tear. LIGAMENTS Lateral: Anterior talofibular ligament intact. Calcaneofibular ligament intact. Posterior talofibular ligament intact. Anterior and posterior tibiofibular ligaments intact. Medial: Deltoid ligament intact. Spring ligament intact. CARTILAGE Ankle Joint: Moderate ankle joint effusion with synovitis 7 mm osteochondral lesion involving the lateral corner of the talar dome with subchondral marrow edema and partial-thickness overlying cartilage loss. Subtalar Joints/Sinus Tarsi: Normal subtalar joints. No subtalar joint effusion. Normal sinus tarsi. Bones: No marrow signal abnormality.  No fracture or dislocation. Soft Tissue: No fluid collection or hematoma. No soft tissue mass. Soft tissue edema along the dorsal aspect of the foot and extending into the medial and lateral aspect of the ankle. IMPRESSION: 1. Mild-moderate tendinosis of the Achilles tendon without a tear. 2. Moderate plantar fasciitis of the medial band of the plantar fascia at the calcaneal insertion with a small partial-thickness tear. 3. 7 mm osteochondral lesion involving  the lateral corner of the talar dome with subchondral marrow edema and partial-thickness overlying cartilage loss. 4. Small amount of fluid in the flexor digitorum, flexor hallucis longus and posterior tibial tendon sheaths as can be seen with tenosynovitis. Electronically Signed   By: Kathreen Devoid   On: 06/14/2018 14:24    Discharge Medications:   Allergies as of 05/30/2018      Reactions   Lisinopril Anaphylaxis   Bee Venom Swelling, Other (See Comments)   On site swelling   Propoxyphene Hives   Codeine Nausea Only   Latex Rash   Meloxicam Diarrhea, Other (See Comments)   Insomnia, constipation   Morphine And Related Rash   Tomato Rash      Medication List    STOP taking these medications   diclofenac 75 MG EC tablet Commonly known as:  VOLTAREN   gabapentin 300 MG capsule Commonly known as:  NEURONTIN   oxyCODONE-acetaminophen 5-325 MG tablet Commonly known as:  PERCOCET/ROXICET     TAKE these medications   albuterol (2.5 MG/3ML) 0.083% nebulizer solution Commonly known as:  PROVENTIL Take 3 mLs (2.5 mg total) by nebulization 3 (three) times daily.   albuterol 108 (90 Base) MCG/ACT inhaler Commonly known as:  PROVENTIL HFA;VENTOLIN HFA Inhale 2 puffs into the lungs every 6 (six) hours as needed for wheezing or shortness of breath.  amoxicillin-clavulanate 500-125 MG tablet Commonly known as:  AUGMENTIN Take 1 tablet (500 mg total) by mouth 3 (three) times daily.   buprenorphine 8 MG Subl SL tablet Commonly known as:  SUBUTEX Place 8 mg under the tongue 2 (two) times daily.   cetirizine 10 MG tablet Commonly known as:  ZYRTEC Take 10 mg by mouth daily as needed for allergies.   cyclobenzaprine 10 MG tablet Commonly known as:  FLEXERIL TAKE 1 TABLET BY MOUTH THREE TIMES DAILY AS NEEDED FOR MUSCLE SPASM What changed:    reasons to take this  additional instructions   diphenhydrAMINE 25 MG tablet Commonly known as:  BENADRYL Take 1 tablet (25 mg total) by  mouth every 6 (six) hours. What changed:    when to take this  reasons to take this   docusate sodium 100 MG capsule Commonly known as:  COLACE Take 1 capsule (100 mg total) by mouth 2 (two) times daily.   ergocalciferol 50000 units capsule Commonly known as:  VITAMIN D2 Take 50,000 Units by mouth every Monday.   ferrous gluconate 324 MG tablet Commonly known as:  FERGON Take 1 tablet (324 mg total) by mouth 2 (two) times daily with a meal.   hydrochlorothiazide 25 MG tablet Commonly known as:  HYDRODIURIL Take 25 mg by mouth daily.   LASIX 20 MG tablet Generic drug:  furosemide Take 20 mg by mouth daily as needed for fluid.   levothyroxine 137 MCG tablet Commonly known as:  SYNTHROID, LEVOTHROID Take 137 mcg by mouth daily before breakfast.   metoCLOPramide 5 MG tablet Commonly known as:  REGLAN Take 5 mg by mouth 3 (three) times daily before meals.   NARCAN 4 MG/0.1ML Liqd nasal spray kit Generic drug:  naloxone Place 0.4 mg into the nose once.   ondansetron 8 MG disintegrating tablet Commonly known as:  ZOFRAN-ODT Take 1 tablet (8 mg total) by mouth every 8 (eight) hours as needed for nausea or vomiting.   oxybutynin 10 MG 24 hr tablet Commonly known as:  DITROPAN-XL Take 10 mg by mouth daily.   pantoprazole 20 MG tablet Commonly known as:  PROTONIX Take 1 tablet (20 mg total) by mouth 2 (two) times daily before a meal.   potassium chloride 10 MEQ tablet Commonly known as:  K-DUR,KLOR-CON Take 10 mEq by mouth 2 (two) times daily.     ASK your doctor about these medications   oxyCODONE 5 MG immediate release tablet Commonly known as:  Oxy IR/ROXICODONE Take 1 tablet (5 mg total) by mouth every 4 (four) hours as needed for up to 7 days for moderate pain ((score 4 to 6)). Ask about: Should I take this medication?       Diagnostic Studies: Dg Chest 2 View  Result Date: 05/23/2018 CLINICAL DATA:  Preop lumbar fusion surgery history of hypertension and  asthma EXAM: CHEST - 2 VIEW COMPARISON:  08/27/2016 FINDINGS: The heart size and mediastinal contours are within normal limits. Both lungs are clear. Mild degenerative changes of the spine. IMPRESSION: No active cardiopulmonary disease. Electronically Signed   By: Donavan Foil M.D.   On: 05/23/2018 15:13   Dg Lumbar Spine Complete  Result Date: 05/27/2018 CLINICAL DATA:  47 year old female undergoing posterior lumbar interbody fusion from L4-S1 EXAM: DG C-ARM 61-120 MIN; LUMBAR SPINE - COMPLETE 4+ VIEW COMPARISON:  Preoperative CT scan 04/07/2018 FINDINGS: A total of 4 intraoperative spot images including AP, cross-table lateral and bilateral oblique images demonstrate surgical changes of L4-L5 and L5-S1  posterior lumbar interbody fusion with bilateral pedicle screw and rod construct. Interbody grafts are present at L4-L5 and L5-S1. No evidence of immediate hardware complication. IMPRESSION: L4-S1 posterior lumbar interbody fusion in progress. Electronically Signed   By: Jacqulynn Cadet M.D.   On: 05/27/2018 16:14   Dg Ankle Complete Right  Result Date: 06/08/2018 CLINICAL DATA:  Acute RIGHT ankle pain for 1 day. No known injury. Initial encounter. EXAM: RIGHT ANKLE - COMPLETE 3+ VIEW FINDINGS: No acute fracture, subluxation or dislocation. Mild soft tissue swelling noted. Small calcaneal spur and mild degenerative changes in the midfoot noted. IMPRESSION: Mild soft tissue swelling without acute bony abnormality. Electronically Signed   By: Margarette Canada M.D.   On: 06/08/2018 13:50   Mr Ankle Right Wo Contrast  Result Date: 06/14/2018 CLINICAL DATA:  Ankle pain, in right leg, ankle and foot swelling. EXAM: MRI OF THE RIGHT ANKLE WITHOUT CONTRAST TECHNIQUE: Multiplanar, multisequence MR imaging of the ankle was performed. No intravenous contrast was administered. COMPARISON:  None. FINDINGS: TENDONS Peroneal: Peroneal longus tendon intact. Peroneal brevis intact. Posteromedial: Posterior tibial tendon  intact. Flexor hallucis longus tendon intact. Flexor digitorum longus tendon intact. Small amount of fluid in the flexor digitorum, flexor hallucis longus and posterior tibial tendon sheaths as can be seen with tenosynovitis. Anterior: Tibialis anterior tendon intact. Extensor hallucis longus tendon intact Extensor digitorum longus tendon intact. Achilles: Mild-moderate tendinosis of the Achilles tendon without a tear. Plantar Fascia: Thickening and increased signal of the medial band of the plantar fascia at the calcaneal insertion with a partial-thickness tear. LIGAMENTS Lateral: Anterior talofibular ligament intact. Calcaneofibular ligament intact. Posterior talofibular ligament intact. Anterior and posterior tibiofibular ligaments intact. Medial: Deltoid ligament intact. Spring ligament intact. CARTILAGE Ankle Joint: Moderate ankle joint effusion with synovitis 7 mm osteochondral lesion involving the lateral corner of the talar dome with subchondral marrow edema and partial-thickness overlying cartilage loss. Subtalar Joints/Sinus Tarsi: Normal subtalar joints. No subtalar joint effusion. Normal sinus tarsi. Bones: No marrow signal abnormality.  No fracture or dislocation. Soft Tissue: No fluid collection or hematoma. No soft tissue mass. Soft tissue edema along the dorsal aspect of the foot and extending into the medial and lateral aspect of the ankle. IMPRESSION: 1. Mild-moderate tendinosis of the Achilles tendon without a tear. 2. Moderate plantar fasciitis of the medial band of the plantar fascia at the calcaneal insertion with a small partial-thickness tear. 3. 7 mm osteochondral lesion involving the lateral corner of the talar dome with subchondral marrow edema and partial-thickness overlying cartilage loss. 4. Small amount of fluid in the flexor digitorum, flexor hallucis longus and posterior tibial tendon sheaths as can be seen with tenosynovitis. Electronically Signed   By: Kathreen Devoid   On: 06/14/2018  14:24   US Venous Img Lower Unilateral Right  Result Date: 06/08/2018 CLINICAL DATA:  Right lower extremity pain and edema. EXAM: RIGHT LOWER EXTREMITY VENOUS DOPPLER ULTRASOUND TECHNIQUE: Gray-scale sonography with graded compression, as well as color Doppler and duplex ultrasound were performed to evaluate the lower extremity deep venous systems from the level of the common femoral vein and including the common femoral, femoral, profunda femoral, popliteal and calf veins including the posterior tibial, peroneal and gastrocnemius veins when visible. The superficial great saphenous vein was also interrogated. Spectral Doppler was utilized to evaluate flow at rest and with distal augmentation maneuvers in the common femoral, femoral and popliteal veins. COMPARISON:  None. FINDINGS: Contralateral Common Femoral Vein: Respiratory phasicity is normal and symmetric with the symptomatic  side. No evidence of thrombus. Normal compressibility. Common Femoral Vein: No evidence of thrombus. Normal compressibility, respiratory phasicity and response to augmentation. Saphenofemoral Junction: No evidence of thrombus. Normal compressibility and flow on color Doppler imaging. Profunda Femoral Vein: No evidence of thrombus. Normal compressibility and flow on color Doppler imaging. Femoral Vein: No evidence of thrombus. Normal compressibility, respiratory phasicity and response to augmentation. Popliteal Vein: No evidence of thrombus. Normal compressibility, respiratory phasicity and response to augmentation. Calf Veins: No evidence of thrombus. Normal compressibility and flow on color Doppler imaging. Superficial Great Saphenous Vein: No evidence of thrombus. Normal compressibility. Venous Reflux:  None. Other Findings: No evidence of superficial thrombophlebitis or abnormal fluid collection. IMPRESSION: No evidence of right lower extremity deep venous thrombosis. Electronically Signed   By: Aletta Edouard M.D.   On: 06/08/2018  15:41   Dg Chest Port 1 View  Result Date: 06/11/2018 CLINICAL DATA:  Central line placement EXAM: PORTABLE CHEST 1 VIEW COMPARISON:  05/23/2018 chest radiograph. FINDINGS: Right internal jugular central venous catheter terminates at the cavoatrial junction. Stable cardiomediastinal silhouette with normal heart size. No pneumothorax. No pleural effusion. Lungs appear clear, with no acute consolidative airspace disease and no pulmonary edema. IMPRESSION: Right internal jugular central venous catheter terminates at the cavoatrial junction. No pneumothorax. No active cardiopulmonary disease. Electronically Signed   By: Ilona Sorrel M.D.   On: 06/11/2018 23:38   Dg C-arm 1-60 Min  Result Date: 05/27/2018 CLINICAL DATA:  47 year old female undergoing posterior lumbar interbody fusion from L4-S1 EXAM: DG C-ARM 61-120 MIN; LUMBAR SPINE - COMPLETE 4+ VIEW COMPARISON:  Preoperative CT scan 04/07/2018 FINDINGS: A total of 4 intraoperative spot images including AP, cross-table lateral and bilateral oblique images demonstrate surgical changes of L4-L5 and L5-S1 posterior lumbar interbody fusion with bilateral pedicle screw and rod construct. Interbody grafts are present at L4-L5 and L5-S1. No evidence of immediate hardware complication. IMPRESSION: L4-S1 posterior lumbar interbody fusion in progress. Electronically Signed   By: Jacqulynn Cadet M.D.   On: 05/27/2018 16:14   Dg C-arm 1-60 Min  Result Date: 05/27/2018 CLINICAL DATA:  47 year old female undergoing posterior lumbar interbody fusion from L4-S1 EXAM: DG C-ARM 61-120 MIN; LUMBAR SPINE - COMPLETE 4+ VIEW COMPARISON:  Preoperative CT scan 04/07/2018 FINDINGS: A total of 4 intraoperative spot images including AP, cross-table lateral and bilateral oblique images demonstrate surgical changes of L4-L5 and L5-S1 posterior lumbar interbody fusion with bilateral pedicle screw and rod construct. Interbody grafts are present at L4-L5 and L5-S1. No evidence of  immediate hardware complication. IMPRESSION: L4-S1 posterior lumbar interbody fusion in progress. Electronically Signed   By: Jacqulynn Cadet M.D.   On: 05/27/2018 16:14   Dg C-arm 1-60 Min  Result Date: 05/27/2018 CLINICAL DATA:  47 year old female undergoing posterior lumbar interbody fusion from L4-S1 EXAM: DG C-ARM 61-120 MIN; LUMBAR SPINE - COMPLETE 4+ VIEW COMPARISON:  Preoperative CT scan 04/07/2018 FINDINGS: A total of 4 intraoperative spot images including AP, cross-table lateral and bilateral oblique images demonstrate surgical changes of L4-L5 and L5-S1 posterior lumbar interbody fusion with bilateral pedicle screw and rod construct. Interbody grafts are present at L4-L5 and L5-S1. No evidence of immediate hardware complication. IMPRESSION: L4-S1 posterior lumbar interbody fusion in progress. Electronically Signed   By: Jacqulynn Cadet M.D.   On: 05/27/2018 16:14   [place] Ekg Site Rite  Result Date: 06/13/2018 If Site Rite image not attached, placement could not be confirmed due to current cardiac rhythm.   They benefited maximally from their  hospital stay and there were no complications.     Disposition:  Discharge Instructions    Call MD / Call 911   Complete by:  As directed    If you experience chest pain or shortness of breath, CALL 911 and be transported to the hospital emergency room.  If you develope a fever above 101 F, pus (white drainage) or increased drainage or redness at the wound, or calf pain, call your surgeon's office.   Constipation Prevention   Complete by:  As directed    Drink plenty of fluids.  Prune juice may be helpful.  You may use a stool softener, such as Colace (over the counter) 100 mg twice a day.  Use MiraLax (over the counter) for constipation as needed.   Diet - low sodium heart healthy   Complete by:  As directed    Discharge instructions   Complete by:  As directed    Call if there is increasing drainage, fever greater than 101.5, severe head  aches, and worsening nausea or light sensitivity. If shortness of breath, bloody cough or chest tightness or pain go to an emergency room. No lifting greater than 10 lbs. Avoid bending, stooping and twisting. Use brace when sitting and out of bed even to go to bathroom. Walk in house for first 2 weeks then may start to get out slowly increasing distances up to one mile by 4-6 weeks post op. After 5 days may shower and change dressing following bathing with shower.When bathing remove the brace shower and replace brace before getting out of the shower. If drainage, keep dry dressing and do not bathe the incision, use an moisture impervious dressing. Please call and return for scheduled follow up appointment 2 weeks from the time of surgery.   Driving restrictions   Complete by:  As directed    No driving for 3 weeks   Increase activity slowly as tolerated   Complete by:  As directed    Lifting restrictions   Complete by:  As directed    No lifting for 10 weeks     Follow-up Information    Jessy Oto, MD In 2 weeks.   Specialty:  Orthopedic Surgery Why:  For wound re-check Contact information: [name] [address] [place] Follow up.   Why:  [facility] arranged- they will call you to set up home visits Contact information: [address] [place] [phone]           Signed: Basil Dess 06/16/2018, 11:29 AM

## 2018-06-17 ENCOUNTER — Inpatient Hospital Stay (HOSPITAL_COMMUNITY): Payer: Medicare Other

## 2018-06-17 LAB — CBC WITH DIFFERENTIAL/PLATELET
Abs Immature Granulocytes: 0 10*3/uL (ref 0.0–0.1)
Basophils Absolute: 0 10*3/uL (ref 0.0–0.1)
Basophils Relative: 0 %
EOS ABS: 0.5 10*3/uL (ref 0.0–0.7)
EOS PCT: 13 %
HCT: 25.8 % — ABNORMAL LOW (ref 36.0–46.0)
HEMOGLOBIN: 8.1 g/dL — AB (ref 12.0–15.0)
Immature Granulocytes: 1 %
LYMPHS ABS: 1.2 10*3/uL (ref 0.7–4.0)
LYMPHS PCT: 28 %
MCH: 26.4 pg (ref 26.0–34.0)
MCHC: 31.4 g/dL (ref 30.0–36.0)
MCV: 84 fL (ref 78.0–100.0)
MONO ABS: 0.3 10*3/uL (ref 0.1–1.0)
MONOS PCT: 8 %
Neutro Abs: 2.1 10*3/uL (ref 1.7–7.7)
Neutrophils Relative %: 50 %
Platelets: 331 10*3/uL (ref 150–400)
RBC: 3.07 MIL/uL — ABNORMAL LOW (ref 3.87–5.11)
RDW: 13.8 % (ref 11.5–15.5)
WBC: 4.2 10*3/uL (ref 4.0–10.5)

## 2018-06-17 LAB — COMPREHENSIVE METABOLIC PANEL
ALBUMIN: 2.6 g/dL — AB (ref 3.5–5.0)
ALK PHOS: 54 U/L (ref 38–126)
ALT: 12 U/L (ref 0–44)
ANION GAP: 9 (ref 5–15)
AST: 18 U/L (ref 15–41)
BUN: 21 mg/dL — ABNORMAL HIGH (ref 6–20)
CALCIUM: 9.5 mg/dL (ref 8.9–10.3)
CO2: 29 mmol/L (ref 22–32)
Chloride: 98 mmol/L (ref 98–111)
Creatinine, Ser: 0.99 mg/dL (ref 0.44–1.00)
GFR calc non Af Amer: >60 mL/min (ref >60)
Glucose, Bld: 101 mg/dL — ABNORMAL HIGH (ref 70–99)
POTASSIUM: 3.4 mmol/L — AB (ref 3.5–5.1)
Sodium: 136 mmol/L (ref 135–145)
TOTAL PROTEIN: 6.5 g/dL (ref 6.5–8.1)
Total Bilirubin: 0.5 mg/dL (ref 0.3–1.2)

## 2018-06-17 LAB — AEROBIC/ANAEROBIC CULTURE (SURGICAL/DEEP WOUND)

## 2018-06-17 LAB — RHEUMATOID FACTOR: Rhuematoid fact SerPl-aCnc: <10 IU/mL (ref 0.0–13.9)

## 2018-06-17 LAB — CULTURE, BLOOD (ROUTINE X 2)
Culture: NO GROWTH
SPECIAL REQUESTS: ADEQUATE

## 2018-06-17 LAB — AEROBIC/ANAEROBIC CULTURE W GRAM STAIN (SURGICAL/DEEP WOUND)

## 2018-06-17 LAB — ANTINUCLEAR ANTIBODIES, IFA: ANA Ab, IFA: NEGATIVE

## 2018-06-17 MED ORDER — LIDOCAINE HCL 1 % IJ SOLN
INTRAMUSCULAR | Status: DC | PRN
  Administered 2018-06-17: 10 mL

## 2018-06-17 MED ORDER — LIDOCAINE HCL 1 % IJ SOLN
INTRAMUSCULAR | Status: AC
  Filled 2018-06-17: qty 20

## 2018-06-17 NOTE — Progress Notes (Signed)
Physical Therapy Treatment Patient Details Name: Marilyn [name] MRN: [id number] DOB: 01/31/1971 Today's Date: 06/17/2018    History of Present Illness 47 y.o. female admitted on 06/11/18 for drainage from her lumbar surgical incision site.  Pt had a TLIF L4-S1 on 05/27/18.  Pt dx with infection and s/p I and D and wound vac placement on 06/11/18.  Also, pt with painful and swollen R ankle (with no know trauma), so surgeon aspirated the ankle during surgery.  Pt with other significant PMH of HTN, HA, dizziness, arthritis, anemia, asthma, bil TKA, L THA with infection and I and D.  S/p PICC line placement in IR on 10/1/9. 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  MD made her WBAT and recommended a CAM boot be worn for gait.     PT Comments    Pt looking much more confident and in much less pain today during gait and mobility.  She was able to walk a significant distance further today and in less time.  CAM boot was donned during gait and that seemed to help.  Unfortunately, we do not have iontophoresis available acutely (this would have to be prescribed and an OP PT clinic), but pt was educated on heel cord stretching as well as ankle ROM exercises.  PT will continue to follow acutely for safe mobility progression  Follow Up Recommendations  Home health PT;Supervision for mobility/OOB     Equipment Recommendations  None recommended by PT    Recommendations for Other Services   NA     Precautions / Restrictions Precautions Precautions: Back Precaution Comments: reviewed 3/3 back precautions Required Braces or Orthoses: Spinal Brace;Other Brace/Splint Spinal Brace: Lumbar corset;Applied in sitting position Other Brace/Splint: wound vac; RLE CAM boot Restrictions Weight Bearing Restrictions: No    Mobility  Bed Mobility               General bed mobility comments: Pt  was OOB in the recliner chair.   Transfers Overall transfer level: Needs assistance Equipment used: Rolling walker (2 wheeled) Transfers: Sit to/from Stand Sit to Stand: Min guard         General transfer comment: Min guard assist for safety during transitions.   Ambulation/Gait Ambulation/Gait assistance: Supervision Gait Distance (Feet): 220 Feet Assistive device: Rolling walker (2 wheeled) Gait Pattern/deviations: Step-through pattern;Shuffle;Trunk flexed Gait velocity: improved Gait velocity interpretation: 1.31 - 2.62 ft/sec, indicative of limited community ambulator General Gait Details: Pt walking faster and with less pain with CAM boot donned on R ankle today.         Balance Overall balance assessment: Needs assistance Sitting-balance support: Feet supported;No upper extremity supported Sitting balance-Leahy Scale: Good     Standing balance support: Bilateral upper extremity supported Standing balance-Leahy Scale: Fair                              Cognition Arousal/Alertness: Awake/alert Behavior During Therapy: WFL for tasks assessed/performed Overall Cognitive Status: Within Functional Limits for tasks assessed                                        Exercises Other Exercises Other Exercises: Educated pt on R ankle heel cord stretch 3x30 secon hold using a long blanket as the pull, also in  ankle DF/PF, EV/IV, and CCW and CW circles.     General Comments General comments (skin integrity, edema, etc.): Ankle ace wrapped and gel ice pack applied at the end of the session.  R ankle edema is significantly improved from previous sessions.       Pertinent Vitals/Pain Pain Assessment: Faces Faces Pain Scale: Hurts little more Pain Location: low back and R ankle Pain Descriptors / Indicators: Grimacing;Guarding Pain Intervention(s): Limited activity within patient's tolerance;Monitored during session;Repositioned           PT  Goals (current goals can now be found in the care plan section) Acute Rehab PT Goals Patient Stated Goal: to get back to normal and get this infection out Progress towards PT goals: Progressing toward goals    Frequency    Min 5X/week      PT Plan Current plan remains appropriate       AM-PAC PT "6 Clicks" Daily Activity  Outcome Measure  Difficulty turning over in bed (including adjusting bedclothes, sheets and blankets)?: Unable Difficulty moving from lying on back to sitting on the side of the bed? : Unable Difficulty sitting down on and standing up from a chair with arms (e.g., wheelchair, bedside commode, etc,.)?: Unable Help needed moving to and from a bed to chair (including a wheelchair)?: A Little Help needed walking in hospital room?: A Little Help needed climbing 3-5 steps with a railing? : A Little 6 Click Score: 12    End of Session Equipment Utilized During Treatment: Back brace Activity Tolerance: Patient limited by pain;Patient limited by fatigue Patient left: in chair;with call bell/phone within reach   PT Visit Diagnosis: Muscle weakness (generalized) (M62.81);Difficulty in walking, not elsewhere classified (R26.2);Pain Pain - Right/Left: Right(lower) Pain - part of body: Ankle and joints of foot(back)     Time: 9562-1308 PT Time Calculation (min) (ACUTE ONLY): 30 min  Charges:  $Gait Training: 23-37 mins      Cela Newcom B. Kissy Cielo, PT, DPT  Acute Rehabilitation [phone] pager #[phone] office         06/17/2018, 10:05 PM

## 2018-06-17 NOTE — Progress Notes (Signed)
     Subjective: 6 Days Post-Op Procedure(s) (LRB): LUMBAR WOUND DEBRIDEMENT (N/A) Awake, alert and oriented x 4. Seen by [name] PA-C earlier today and was non verbal. I saw her at 10:30 AM and she was speaking normally. She is schedule for PICCU line insertion by IR today as IV team not able to place line due to poor access. NPO this AM for PICCU line  Procedure. Has a right IJ line this AM.   Patient reports pain as marked.    Objective:   VITALS:  Temp:  [98.3 F (36.8 C)-99.4 F (37.4 C)] 99.4 F (37.4 C) (10/01 0420) Pulse Rate:  [79-81] 79 (10/01 0420) Resp:  [16] 16 (10/01 0420) BP: (114-124)/(61-68) 114/61 (10/01 0420) SpO2:  [100 %] 100 % (10/01 0420)  Neurologically intact ABD soft Neurovascular intact Sensation intact distally Intact pulses distally Dorsiflexion/Plantar flexion intact Incision: moderate drainage No cellulitis present VAC intact and suction is at -125 mm Hg.   LABS Recent Labs    06/16/18 0725 06/17/18 0823  HGB 7.9* 8.1*  WBC 5.7 4.2  PLT 355 331   Recent Labs    06/17/18 0823  NA 136  K 3.4*  CL 98  CO2 29  BUN 21*  CREATININE 0.99  GLUCOSE 101*   No results for input(s): LABPT, INR in the last 72 hours.  RA negative <10.0  Assessment/Plan: 6 Days Post-Op Procedure(s) (LRB): LUMBAR WOUND DEBRIDEMENT (N/A) Post op incision infection with superficial hematoma. 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  Day #5 Maxipime 2gm q 12 hours.   Advance diet Up with therapy Continue ABX therapy due to Post-op infection  PICCU line and mobilize. May be ready for discharge in AM 10/2.  Vira Browns 06/17/2018, 1:07 PMPatient ID: [name], female   DOB: 10-19-1970, 47 y.o.   MRN: [id number]

## 2018-06-17 NOTE — Progress Notes (Signed)
PT Cancellation Note  Patient Details Name: Marilyn [name] MRN: [id number] DOB: Feb 01, 1971   Cancelled Treatment:    Reason Eval/Treat Not Completed: Patient at procedure or test/unavailable.  Pt out of room getting PICC line placed in IR.  PT to check back later as time allows.  Thanks,    Rollene Rotunda. Keelyn Fjelstad, PT, DPT  Acute Rehabilitation [phone] pager #[phone] office   06/17/2018, 11:06 AM

## 2018-06-17 NOTE — Plan of Care (Signed)

## 2018-06-17 NOTE — Procedures (Signed)
Pre procedural Diagnosis: Poor venous access Post Procedural Diagnosis: Same  Successful placement of left brachial vein approach dual lumen PICC line with tip at the superior caval-atrial junction.    EBL: None  No immediate post procedural complication.  The PICC line is ready for immediate use.  Katherina Right, MD Pager #: [phone]

## 2018-06-18 DIAGNOSIS — D62 Acute posthemorrhagic anemia: Secondary | ICD-10-CM | POA: Diagnosis present

## 2018-06-18 MED ORDER — HYDROMORPHONE HCL 1 MG/ML IJ SOLN
2.0000 mg | INTRAMUSCULAR | Status: DC | PRN
  Administered 2018-06-18: 2 mg via INTRAVENOUS
  Filled 2018-06-18: qty 2

## 2018-06-18 NOTE — Progress Notes (Addendum)
Occupational Therapy Treatment Patient Details Name: Marilyn [name] MRN: [id number] DOB: 07/29/1971 Today's Date: 06/18/2018    History of present illness 47 y.o. female admitted on 06/11/18 for drainage from her lumbar surgical incision site.  Pt had a TLIF L4-S1 on 05/27/18.  Pt dx with infection and s/p I and D and wound vac placement on 06/11/18.  Also, pt with painful and swollen R ankle (with no know trauma), so surgeon aspirated the ankle during surgery.  Pt with other significant PMH of HTN, HA, dizziness, arthritis, anemia, asthma, bil TKA, L THA with infection and I and D.  S/p PICC line placement in IR on 10/1/9. 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  MD made her WBAT and recommended a CAM boot be worn for gait.    OT comments  Pt progressing toward stated goals. Focused session on functional transfers this date. Pt completed tub transfer with shower seat in ortho gym this date at min guard with RW. Independent in donning spinal brace prior to functional mobility this date. Pt narrated throughout t/f how she would normally complete at home, backed up to shower seat then moved legs into tub from sitting (acted as tub bench). Simulated toilet t/f with recliner, pt needing min guard A this date. Pt still needing VC's for appropriate hand placement, frequent tendency to place both hands on RW when completing t/fs. Noted throughout session pt to have word finding difficulty, made RN aware. D/c plans remain appropriate, will continue to follow pt acutely.    Follow Up Recommendations  Other (comment);Home health OT;Supervision/Assistance - 24 hour(resume [facility])    Equipment Recommendations  None recommended by OT(Pt has appropriate DME)    Recommendations for Other Services      Precautions / Restrictions Precautions Precautions: Back Precaution Booklet  Issued: No Precaution Comments: reviewed 3/3 back precautions Required Braces or Orthoses: Spinal Brace;Other Brace/Splint Spinal Brace: Lumbar corset;Applied in sitting position Other Brace/Splint: wound vac; RLE CAM boot Restrictions Weight Bearing Restrictions: No       Mobility Bed Mobility               General bed mobility comments: Pt was OOB in the recliner chair.   Transfers Overall transfer level: Needs assistance Equipment used: Rolling walker (2 wheeled) Transfers: Sit to/from Stand Sit to Stand: Min guard         General transfer comment: Needing VC's for hand placement, tendency to put both hands on RW in functional t/fs    Balance Overall balance assessment: Needs assistance Sitting-balance support: Feet supported;No upper extremity supported Sitting balance-Leahy Scale: Good     Standing balance support: Bilateral upper extremity supported Standing balance-Leahy Scale: Fair Standing balance comment: relies on the RW in standing for support.                            ADL either performed or assessed with clinical judgement   ADL Overall ADL's : Needs assistance/impaired                         Toilet Transfer: Min Administrator Details (indicate cue type and reason): pt needing cues for appropriate hand placement     Tub/ Shower Transfer: Min guard;Rolling walker;Tub transfer;Shower Field seismologist Details (indicate cue type and reason): pt narrating how  she completes t/f at home, needing cues for hand placement Functional mobility during ADLs: Min guard;Rolling walker General ADL Comments: Pt progressing this date. Focused session on functional tub t/f. Pt able to complete in tub in ortho gym with min guard A. Needing VC's/reminders of appropriate hand placement in functional t/f     Vision       Perception     Praxis      Cognition Arousal/Alertness: Awake/alert Behavior During Therapy: WFL  for tasks assessed/performed Overall Cognitive Status: Within Functional Limits for tasks assessed                                 General Comments: Noted pt with several instances of word finding difficulty. Informed RN        Exercises     Shoulder Instructions       General Comments CAM boot on R foot    Pertinent Vitals/ Pain       Faces Pain Scale: No hurt  Home Living                                          Prior Functioning/Environment              Frequency  Min 2X/week        Progress Toward Goals  OT Goals(current goals can now be found in the care plan section)     Acute Rehab OT Goals Patient Stated Goal: to go home as soon as possible OT Goal Formulation: With patient Time For Goal Achievement: 06/26/18 Potential to Achieve Goals: Good ADL Goals Pt Will Perform Grooming: with supervision;standing Pt Will Perform Lower Body Bathing: with supervision;sitting/lateral leans;with adaptive equipment Pt Will Perform Upper Body Dressing: with modified independence;sitting Pt Will Perform Lower Body Dressing: with set-up;with adaptive equipment;sit to/from stand Pt Will Transfer to Toilet: ambulating;with supervision;regular height toilet Pt Will Perform Toileting - Clothing Manipulation and hygiene: with supervision;with adaptive equipment;sit to/from stand Pt Will Perform Tub/Shower Transfer: Tub transfer;with set-up;with supervision;ambulating;shower seat;rolling walker Additional ADL Goal #1: Pt will perform bed mobility at supervision level AFTER an ADL activity (getting into supine position)  Plan Discharge plan remains appropriate;Frequency remains appropriate    Co-evaluation                 AM-PAC PT "6 Clicks" Daily Activity     Outcome Measure   Help from another person eating meals?: None Help from another person taking care of personal grooming?: None(in sitting) Help from another person  toileting, which includes using toliet, bedpan, or urinal?: A Lot Help from another person bathing (including washing, rinsing, drying)?: A Lot Help from another person to put on and taking off regular upper body clothing?: A Little Help from another person to put on and taking off regular lower body clothing?: A Lot 6 Click Score: 17    End of Session Equipment Utilized During Treatment: Back brace;Rolling walker  OT Visit Diagnosis: Unsteadiness on feet (R26.81);Other abnormalities of gait and mobility (R26.89);Muscle weakness (generalized) (M62.81)   Activity Tolerance Patient tolerated treatment well   Patient Left in chair;with call bell/phone within reach   Nurse Communication Mobility status        Time: 1191-4782 OT Time Calculation (min): 31 min  Charges: OT General Charges $OT Visit: 1 Visit OT Treatments $Self  Care/Home Management : 23-37 mins  Dalphine Handing, MSOT, OTR/L Behavioral Health OT/ Acute Relief OT  Dalphine Handing 06/18/2018, 5:52 PM

## 2018-06-18 NOTE — Progress Notes (Signed)
     Subjective: 7 Days Post-Op Procedure(s) (LRB): LUMBAR WOUND DEBRIDEMENT (N/A) Pain with dressing change dilaudid doesn't phase patient.  Patient reports pain as moderate.    Objective:   VITALS:  Temp:  [98.2 F (36.8 C)-98.3 F (36.8 C)] 98.3 F (36.8 C) (10/02 0503) Pulse Rate:  [71-104] 72 (10/02 0503) Resp:  [16] 16 (10/02 0503) BP: (88-136)/(38-103) 123/103 (10/02 0503) SpO2:  [93 %-100 %] 100 % (10/02 0503)  Neurologically intact ABD soft Neurovascular intact Sensation intact distally Intact pulses distally Dorsiflexion/Plantar flexion intact Incision: moderate drainage VAC change, the incision is granulating nearly 100 %.   LABS Recent Labs    06/16/18 0725 06/17/18 0823  HGB 7.9* 8.1*  WBC 5.7 4.2  PLT 355 331   Recent Labs    06/17/18 0823  NA 136  K 3.4*  CL 98  CO2 29  BUN 21*  CREATININE 0.99  GLUCOSE 101*   No results for input(s): LABPT, INR in the last 72 hours.   Assessment/Plan: 7 Days Post-Op Procedure(s) (LRB): LUMBAR WOUND DEBRIDEMENT (N/A)  Advance diet Up with therapy D/C IV fluids Continue ABX therapy due to Post-op infection  Will have PT today I will check in on her this afternoon and plan to discharge if she is capable. HHN for IV antibiotics and PICCU care and VAC care.   Vira Browns 06/18/2018, 9:05 AMPatient ID: Marilyn [name], female   DOB: 06/19/1971, 47 y.o.   MRN: [id number]

## 2018-06-18 NOTE — Progress Notes (Addendum)
     Subjective: 7 Days Post-Op Procedure(s) (LRB): LUMBAR WOUND DEBRIDEMENT (N/A) Awake, alert and oriented x 4. Not receiving further surgical treatment at this time and PICCU is functioning well. Maxipime 2 grams every 12 hours. The [facility] will need dressing changes every other day and will probably be requiring changes 3 times per week for the time it takes to heal the incision. VAC will need to be the longer use VAC and the larger machine.   Patient reports pain as mild.    Objective:   VITALS:  Temp:  [97.4 F (36.3 C)-98.3 F (36.8 C)] 98 F (36.7 C) (10/02 1554) Pulse Rate:  [71-74] 72 (10/02 1554) Resp:  [16] 16 (10/02 0503) BP: (100-135)/(53-103) 135/66 (10/02 1554) SpO2:  [97 %-100 %] 97 % (10/02 1554)  Neurologically intact ABD soft Neurovascular intact Sensation intact distally Intact pulses distally Dorsiflexion/Plantar flexion intact Incision: scant drainage   LABS Recent Labs    06/16/18 0725 06/17/18 0823  HGB 7.9* 8.1*  WBC 5.7 4.2  PLT 355 331   Recent Labs    06/17/18 0823  NA 136  K 3.4*  CL 98  CO2 29  BUN 21*  CREATININE 0.99  GLUCOSE 101*   No results for input(s): LABPT, INR in the last 72 hours.   Assessment/Plan: 7 Days Post-Op Procedure(s) (LRB): LUMBAR WOUND DEBRIDEMENT (N/A)  Anemia due to blood loss.   Advance diet Up with therapy D/C IV fluids Continue ABX therapy due to Post-op infection Nothing planned at this time other than arranging for  Wound VAC at home and IV antibiotics q 12 hours. Home health will probably provide VAC changes and  anibiotic boluses and PICCU line care education. As the  [facility] need for home will not be the [facility] I will keep her till AM when the [facility] arrangements can be made.  Vira Browns 06/18/2018, 5:50 PMPatient ID: Marilyn [name], female   DOB: Dec 26, 1970, 47 y.o.   MRN: [id number]

## 2018-06-18 NOTE — Plan of Care (Signed)

## 2018-06-18 NOTE — Care Management Note (Addendum)
Case Management Note  Patient Details  Name: SHAVAUN OSTERLOH MRN: [id number] Date of Birth: 10/15/1970  Subjective/Objective:     47 yr old female admitted with infection of lumbar wound, s/p I & D 06/11/18 with application of wound vac.  05/27/18 patient had L4-5, L5-S1 fusion.                              Action/Plan: Patient is active with [facility], has DME. CM has contacted Lorenza Chick with [facility] and informed him of patient's admission and that she will need resumption of care at discharge. Patient will need HHRN for IV antibiotics, Lorenza Chick is aware, Case manager has spoken with Jeri Modena, [facility] IV antibiotic Specialist, they will provide the antibiotics for patient, PAM and Kandee Keen are in communication concerning this. CM will continue to monitor.   Expected Discharge Date:    pending           Expected Discharge Plan:  Home w Home Health Services  In-House Referral:  NA  Discharge planning Services  CM Consult  Post Acute Care Choice:  Resumption of Svcs/PTA Provider, Home Health Choice offered to:  Patient  DME Arranged:  IV pump/equipment(has DME) DME Agency:  [facility]  HH Arranged:  PT, RN [facility] Agency:  [facility], [facility]  Status of Service:  Completed, signed off  If discussed at Long Length of Stay Meetings, dates discussed:    Additional Comments:  Durenda Guthrie, RN 06/18/2018, 10:00 AM

## 2018-06-18 NOTE — Consult Note (Signed)
WOC Nurse wound consult note NPWT VAC dressing changed in [facility] 5N14.  Dr. Otelia Sergeant in to observe wound.  No family present Reason for Consult: MWF VAC dressing change Wound type: Surgical Measurement: 7.5 cm x 3 cm x 6.5 cm Wound bed: Red, bleeding, granulation tissue Drainage (amount, consistency, odor) Bloody in cannister Periwound: Small fluid filled bulla to the patient's left of the incision.  Covered with a small foam dressing.  Otherwise, periwound tissue is normal color and texture. Dressing procedure/placement/frequency: NPWT VAC by WOC team. One piece of black foam removed from wound, one piece of black foam placed, immediate seal obtained. Helmut Muster, RN, MSN, CWOCN, CNS-BC, pager [phone]

## 2018-06-18 NOTE — Progress Notes (Signed)
06/18/18 1758  PT Visit Information  Last PT Received On 06/18/18  Assistance Needed +1  History of Present Illness 47 y.o. female admitted on 06/11/18 for drainage from her lumbar surgical incision site.  Pt had a TLIF L4-S1 on 05/27/18.  Pt dx with infection and s/p I and D and wound vac placement on 06/11/18.  Also, pt with painful and swollen R ankle (with no know trauma), so surgeon aspirated the ankle during surgery.  Pt with other significant PMH of HTN, HA, dizziness, arthritis, anemia, asthma, bil TKA, L THA with infection and I and D.  S/p PICC line placement in IR on 10/1/9. 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  MD made her WBAT and recommended a CAM boot be worn for gait.   Subjective Data  Patient Stated Goal to go home as soon as possible  Precautions  Precautions Back  Precaution Booklet Issued No  Precaution Comments reviewed 3/3 back precautions  Required Braces or Orthoses Spinal Brace;Other Brace/Splint  Spinal Brace Lumbar corset;Applied in sitting position  Other Brace/Splint wound vac; RLE CAM boot  Restrictions  Weight Bearing Restrictions No  Pain Assessment  Pain Assessment Faces  Faces Pain Scale 2  Pain Location low back and R ankle  Pain Descriptors / Indicators Grimacing;Guarding  Pain Intervention(s) Limited activity within patient's tolerance;Monitored during session;Repositioned  Cognition  Arousal/Alertness Awake/alert  Behavior During Therapy WFL for tasks assessed/performed  Overall Cognitive Status Within Functional Limits for tasks assessed  Bed Mobility  General bed mobility comments Pt was OOB in the recliner chair.   Transfers  Overall transfer level Needs assistance  Equipment used Rolling walker (2 wheeled)  Transfers Sit to/from Stand  Sit to Stand Min guard  General transfer comment Pt with good hand placement from recliner.  Increased time required to perform.   Ambulation/Gait  Ambulation/Gait assistance Supervision  Gait Distance (Feet) 500 Feet  Assistive device Rolling walker (2 wheeled)  Gait Pattern/deviations Step-through pattern;Shuffle;Trunk flexed  General Gait Details Pt with improved gait tolerance with use of R cam boot. Supervision for safety. Educated about generalized walking program to perform at home.   Gait velocity improved  Gait velocity interpretation 1.31 - 2.62 ft/sec, indicative of limited community ambulator  Balance  Overall balance assessment Needs assistance  Sitting-balance support Feet supported;No upper extremity supported  Sitting balance-Leahy Scale Good  Standing balance support Bilateral upper extremity supported  Standing balance-Leahy Scale Fair  General Comments  General comments (skin integrity, edema, etc.) Educated/demonstrated how to don/doff R CAM boot.   Other Exercises  Other Exercises Verbally reviewed ankle ROM exercises and calf circles.   PT - End of Session  Equipment Utilized During Treatment Back brace;Other (comment) (R CAM boot )  Activity Tolerance Patient tolerated treatment well  Patient left in chair;with call bell/phone within reach  Nurse Communication Mobility status   PT - Assessment/Plan  PT Plan Current plan remains appropriate  PT Visit Diagnosis Muscle weakness (generalized) (M62.81);Difficulty in walking, not elsewhere classified (R26.2);Pain  Pain - Right/Left Right (lower )  Pain - part of body Ankle and joints of foot (back )  PT Frequency (ACUTE ONLY) Min 5X/week  Follow Up Recommendations Home health PT;Supervision for mobility/OOB  PT equipment None recommended by PT  AM-PAC PT "6 Clicks" Daily Activity Outcome Measure  Difficulty turning over in bed (including adjusting bedclothes, sheets and blankets)? 1  Difficulty  moving from lying on back to sitting on the side of the bed?  1  Difficulty sitting down on and standing up  from a chair with arms (e.g., wheelchair, bedside commode, etc,.)? 1  Help needed moving to and from a bed to chair (including a wheelchair)? 3  Help needed walking in hospital room? 3  Help needed climbing 3-5 steps with a railing?  3  6 Click Score 12  Mobility G Code  CL  PT Goal Progression  Progress towards PT goals Progressing toward goals  Acute Rehab PT Goals  PT Goal Formulation With patient  Time For Goal Achievement 06/19/18  Potential to Achieve Goals Good  PT Time Calculation  PT Start Time (ACUTE ONLY) 1640  PT Stop Time (ACUTE ONLY) 1704  PT Time Calculation (min) (ACUTE ONLY) 24 min  PT General Charges  $$ ACUTE PT VISIT 1 Visit  PT Treatments  $Gait Training 23-37 mins   Pt progressing towards goals. Improved tolerance for gait, and pt able to increase ambulation distance. Required supervision to min guard for mobility using RW. Verbally reviewed R ankle exercises and generalized walking program. Educated/demonstrated how to don/doff R CAM boot. Current recommendations appropriate. Will continue to follow acutely to maximize functional mobility independence and safety.   Gladys Damme, PT, DPT  Acute Rehabilitation Services  Pager: [phone] Office: [phone]

## 2018-06-18 NOTE — Progress Notes (Signed)
Changed wound vac cannister.

## 2018-06-19 ENCOUNTER — Encounter (HOSPITAL_COMMUNITY): Payer: Self-pay | Admitting: *Deleted

## 2018-06-19 MED ORDER — DICLOFENAC SODIUM 1 % TD GEL: 4.0000 g | Freq: Four times a day (QID) | TRANSDERMAL | 3 refills | Status: DC

## 2018-06-19 MED ORDER — OXYCODONE HCL ER 10 MG PO T12A: 10.0000 mg | EXTENDED_RELEASE_TABLET | Freq: Two times a day (BID) | ORAL | 0 refills | Status: DC

## 2018-06-19 MED ORDER — JUVEN PO PACK: 1.0000 | PACK | Freq: Two times a day (BID) | ORAL | 3 refills | Status: DC

## 2018-06-19 MED ORDER — OXYCODONE HCL 10 MG PO TABS: 10.0000 mg | ORAL_TABLET | ORAL | 0 refills | Status: AC | PRN

## 2018-06-19 MED ORDER — SODIUM CHLORIDE 0.9 % IV SOLN: 2.0000 g | Freq: Two times a day (BID) | INTRAVENOUS | Status: DC

## 2018-06-19 MED ORDER — CEFEPIME IV (FOR PTA / DISCHARGE USE ONLY): 2.0000 g | Freq: Two times a day (BID) | INTRAVENOUS | 0 refills | Status: AC

## 2018-06-19 MED ORDER — HEPARIN SOD (PORK) LOCK FLUSH 100 UNIT/ML IV SOLN
250.0000 [IU] | INTRAVENOUS | Status: AC | PRN
  Administered 2018-06-19: 250 [IU]

## 2018-06-19 MED ORDER — ALLOPURINOL 100 MG PO TABS: 100.0000 mg | ORAL_TABLET | Freq: Two times a day (BID) | ORAL | 6 refills | Status: DC

## 2018-06-19 NOTE — Progress Notes (Signed)
     Subjective: 8 Days Post-Op Procedure(s) (LRB): LUMBAR WOUND DEBRIDEMENT (N/A) Awake, alert and oriented x 4. Patient has pain but is sleeping well vital signs are stable. BP and pulse rate normal. Saturating 100 %. My stomach is upset. On aspirin, And ferrous gluconate. Concern for DVT prophylaxis and anemia.   Patient reports pain as moderate.    Objective:   VITALS:  Temp:  [97.4 F (36.3 C)-98 F (36.7 C)] 97.6 F (36.4 C) (10/03 0559) Pulse Rate:  [68-74] 68 (10/03 0559) Resp:  [15-16] 15 (10/03 0559) BP: (119-135)/(53-75) 131/75 (10/03 0559) SpO2:  [97 %-100 %] 97 % (10/03 0559)  Neurologically intact ABD soft Neurovascular intact Sensation intact distally Intact pulses distally Dorsiflexion/Plantar flexion intact Incision: scant drainage VAC to container, container changed less drainage.   LABS Recent Labs    06/17/18 0823  HGB 8.1*  WBC 4.2  PLT 331   Recent Labs    06/17/18 0823  NA 136  K 3.4*  CL 98  CO2 29  BUN 21*  CREATININE 0.99  GLUCOSE 101*   No results for input(s): LABPT, INR in the last 72 hours.   Assessment/Plan: 8 Days Post-Op Procedure(s) (LRB): LUMBAR WOUND DEBRIDEMENT (N/A)  Advance diet Up with therapy D/C IV fluids Continue ABX therapy due to Post-op infection  Arrange for VAC at home. Discharge home today.  Vira Browns 06/19/2018, 8:41 AMPatient ID: Marilyn [name], female   DOB: Dec 28, 1970, 47 y.o.   MRN: [id number]

## 2018-06-19 NOTE — Care Management (Signed)
Patient will need [employer] wound vac for discharge per Dr. Otelia Sergeant. Case manager faxed all necessary information to Jerilee Hoh, [employer] representative. Fax: [phone]. Patient will discharge home after [facility] has been delivered and applied.

## 2018-06-19 NOTE — Progress Notes (Signed)
Home health agency notified pt that IV ABX can not be supplied to pt until tomorrow morning. Notified on MD who gave clearance to give tonight's ABX dose and then discharge tonight. Pt and family aware and in agreement. Wound vac going home with pt is connected to wound and functioning properly.

## 2018-06-19 NOTE — Progress Notes (Signed)
PT Cancellation Note  Patient Details Name: Marilyn [name] MRN: [id number] DOB: [birth year]   Cancelled Treatment:    Reason Eval/Treat Not Completed: Other (comment).  Pt waiting for home vac to d/c.  PT answered some questions about her CAM boot and pt agreed, there was nothing she felt she needed to practice ask PT about mobility.  PT to follow acutely until d/c confirmed.     Marilyn [name], PT, DPT  Acute Rehabilitation [phone] pager [phone]) [phone] office     Marilyn [name] 06/19/2018, 5:27 PM

## 2018-06-19 NOTE — Plan of Care (Signed)
  Problem: Education: Goal: Knowledge of General Education information will improve Description Including pain rating scale, medication(s)/side effects and non-pharmacologic comfort measures Outcome: Progressing   

## 2018-06-23 ENCOUNTER — Telehealth (INDEPENDENT_AMBULATORY_CARE_PROVIDER_SITE_OTHER): Payer: Self-pay | Admitting: Specialist

## 2018-06-23 NOTE — Telephone Encounter (Signed)
Patient called asked for a call back concerning the pain medicine prescribed for her when she was in the hospital. Patient said the pharmacy need a call concerning 12 hr release (Oxycodone) Patient said the pharmacy would not fill the Rx. The number to contact patient is [phone]

## 2018-06-24 NOTE — Telephone Encounter (Signed)
I have submitted to both [facility] Medicare and to Medicaid for Prior auth. Pend approvals

## 2018-06-26 ENCOUNTER — Encounter (INDEPENDENT_AMBULATORY_CARE_PROVIDER_SITE_OTHER): Payer: Self-pay | Admitting: Surgery

## 2018-06-26 ENCOUNTER — Ambulatory Visit (INDEPENDENT_AMBULATORY_CARE_PROVIDER_SITE_OTHER): Payer: Medicare Other | Admitting: Surgery

## 2018-06-26 DIAGNOSIS — M869 Osteomyelitis, unspecified: Secondary | ICD-10-CM

## 2018-06-26 DIAGNOSIS — M4626 Osteomyelitis of vertebra, lumbar region: Secondary | ICD-10-CM

## 2018-06-26 NOTE — Telephone Encounter (Signed)
This was approved thru [facility] Medicare

## 2018-06-26 NOTE — Discharge Summary (Signed)
Patient ID: Marilyn [name] MRN: [id number] DOB/AGE: [birth year] 47 y.o.  Admit date: 06/11/2018 Discharge date: 06/26/2018  Admission Diagnoses:  Principal Problem:   Superficial incisional surgical site infection Active Problems:   Right ankle effusion   Infected incision   Deep incisional surgical site infection   Plantar fasciitis of left foot   Tendonitis, Achilles, right   Osteochondral talar dome lesion   Anemia due to blood loss, acute   Discharge Diagnoses:  Principal Problem:   Superficial incisional surgical site infection Active Problems:   Right ankle effusion   Infected incision   Deep incisional surgical site infection   Plantar fasciitis of left foot   Tendonitis, Achilles, right   Osteochondral talar dome lesion   Anemia due to blood loss, acute  status post Procedure(s): LUMBAR WOUND DEBRIDEMENT  Past Medical History:  Diagnosis Date  . Anemia    taking iron now. pt states having no current issues 09/02/2015  . Anginal pain ([place])    pt states experiences chest wall pain pt states related to her asthma   . Anxiety    with MRI's  . Arthritis    Everywhere  . Asthma   . Dizziness   . GERD (gastroesophageal reflux disease)   . Headache(784.0)    HX OF MIGRAINES  . History of bronchitis   . Hypertension   . Hypothyroidism    takes levothyroxen  . OSA on CPAP   . Shortness of breath    with exertion  . Wears glasses     Surgeries: Procedure(s): LUMBAR WOUND DEBRIDEMENT on 06/11/2018   Consultants:   Discharged Condition: Improved  Hospital Course: Marilyn [name] is an 47 y.o. female who was admitted 06/11/2018 for operative treatment of Superficial incisional surgical site infection. Patient failed conservative treatments (please see the history and physical for the specifics) and had severe unremitting pain that affects sleep, daily activities and work/hobbies. After pre-op clearance, the patient was taken to the operating room on  06/11/2018 and underwent  Procedure(s): [place].    Patient was given perioperative antibiotics:  Anti-infectives (From admission, onward)   Start     Dose/Rate Route Frequency Ordered Stop   06/19/18 0000  ceFEPime (MAXIPIME) IVPB     2 g Intravenous Every 12 hours 06/19/18 0859 07/27/18 2359   06/19/18 0000  ceFEPIme 2 g in sodium chloride 0.9 % 100 mL     2 g 200 mL/hr over 30 Minutes Intravenous Every 12 hours 06/19/18 0859     06/13/18 1500  ceFEPIme (MAXIPIME) 2 g in sodium chloride 0.9 % 100 mL IVPB  Status:  Discontinued     2 g 200 mL/hr over 30 Minutes Intravenous Every 12 hours 06/13/18 1428 06/20/18 0105   06/13/18 1200  cefTRIAXone (ROCEPHIN) 1 g in sodium chloride 0.9 % 100 mL IVPB  Status:  Discontinued     1 g 200 mL/hr over 30 Minutes Intravenous Every 24 hours 06/13/18 1056 06/13/18 1428   06/12/18 1000  vancomycin (VANCOCIN) 1,500 mg in sodium chloride 0.9 % 500 mL IVPB  Status:  Discontinued     1,500 mg 250 mL/hr over 120 Minutes Intravenous Every 12 hours 06/12/18 0036 06/13/18 1056   06/11/18 2229  polymyxin B 500,000 Units, bacitracin 50,000 Units in sodium chloride 0.9 % 500 mL irrigation  Status:  Discontinued       As needed 06/11/18 2244 06/11/18 2244   06/11/18 2130  piperacillin-tazobactam (ZOSYN) IVPB 3.375 g  3.375 g 12.5 mL/hr over 240 Minutes Intravenous  Once 06/11/18 2129 06/11/18 2206       Patient was given sequential compression devices and early ambulation to prevent DVT.   Patient benefited maximally from hospital stay and there were no complications. At the time of discharge, the patient was urinating/moving their bowels without difficulty, tolerating a regular diet, pain is controlled with oral pain medications and they have been cleared by PT/OT.   Recent vital signs: No data found.   Recent laboratory studies: No results for input(s): WBC, HGB, HCT, PLT, NA, K, CL, CO2, BUN, CREATININE, GLUCOSE, INR, CALCIUM in the last  72 hours.  Invalid input(s): PT, 2   Discharge Medications:   Allergies as of 06/19/2018      Reactions   Lisinopril Anaphylaxis   Bee Venom Swelling, Other (See Comments)   On site swelling   Propoxyphene Hives   Codeine Nausea Only   Latex Rash   Meloxicam Diarrhea, Other (See Comments)   Insomnia, constipation   Morphine And Related Rash   Tomato Rash      Medication List    STOP taking these medications   amoxicillin-clavulanate 500-125 MG tablet Commonly known as:  AUGMENTIN     TAKE these medications   albuterol (2.5 MG/3ML) 0.083% nebulizer solution Commonly known as:  PROVENTIL Take 3 mLs (2.5 mg total) by nebulization 3 (three) times daily.   albuterol 108 (90 Base) MCG/ACT inhaler Commonly known as:  PROVENTIL HFA;VENTOLIN HFA Inhale 2 puffs into the lungs every 6 (six) hours as needed for wheezing or shortness of breath.   allopurinol 100 MG tablet Commonly known as:  ZYLOPRIM Take 1 tablet (100 mg total) by mouth 2 (two) times daily.   buprenorphine 8 MG Subl SL tablet Commonly known as:  SUBUTEX Place 8 mg under the tongue 2 (two) times daily.   ceFEPime  IVPB Commonly known as:  MAXIPIME Inject 2 g into the vein every 12 (twelve) hours. Indication:   Deep incisional surgical site infection Last Day of Therapy:  07/24/18 Labs - Once weekly:  CBC/D and BMP, Labs - Every other week:  ESR and CRP   ceFEPIme 2 g in sodium chloride 0.9 % 100 mL Inject 2 g into the vein every 12 (twelve) hours.   cetirizine 10 MG tablet Commonly known as:  ZYRTEC Take 10 mg by mouth daily as needed for allergies.   cyclobenzaprine 10 MG tablet Commonly known as:  FLEXERIL TAKE 1 TABLET BY MOUTH THREE TIMES DAILY AS NEEDED FOR MUSCLE SPASM What changed:    reasons to take this  additional instructions   diclofenac sodium 1 % Gel Commonly known as:  VOLTAREN Apply 4 g topically 4 (four) times daily.   diphenhydrAMINE 25 MG tablet Commonly known as:   BENADRYL Take 1 tablet (25 mg total) by mouth every 6 (six) hours. What changed:    when to take this  reasons to take this   docusate sodium 100 MG capsule Commonly known as:  COLACE Take 1 capsule (100 mg total) by mouth 2 (two) times daily.   ergocalciferol 50000 units capsule Commonly known as:  VITAMIN D2 Take 50,000 Units by mouth every Monday.   ferrous gluconate 324 MG tablet Commonly known as:  FERGON Take 1 tablet (324 mg total) by mouth 2 (two) times daily with a meal.   hydrochlorothiazide 25 MG tablet Commonly known as:  HYDRODIURIL Take 25 mg by mouth daily.   LASIX 20 MG  tablet Generic drug:  furosemide Take 20 mg by mouth daily as needed for fluid.   levothyroxine 137 MCG tablet Commonly known as:  SYNTHROID, LEVOTHROID Take 137 mcg by mouth daily before breakfast.   metoCLOPramide 5 MG tablet Commonly known as:  REGLAN Take 5 mg by mouth 3 (three) times daily before meals.   NARCAN 4 MG/0.1ML Liqd nasal spray kit Generic drug:  naloxone Place 0.4 mg into the nose once. Notes to patient:  Use as directed on pharmacy packing as needed only.   nutrition supplement (JUVEN) Pack Take 1 packet by mouth 2 (two) times daily between meals.   ondansetron 8 MG disintegrating tablet Commonly known as:  ZOFRAN-ODT Take 1 tablet (8 mg total) by mouth every 8 (eight) hours as needed for nausea or vomiting.   oxybutynin 10 MG 24 hr tablet Commonly known as:  DITROPAN-XL Take 10 mg by mouth daily.   Oxycodone HCl 10 MG Tabs Take 1 tablet (10 mg total) by mouth every 3 (three) hours as needed for up to 7 days for severe pain ((score 7 to 10)).   oxyCODONE 10 mg 12 hr tablet Commonly known as:  OXYCONTIN Take 1 tablet (10 mg total) by mouth every 12 (twelve) hours.   pantoprazole 20 MG tablet Commonly known as:  PROTONIX Take 1 tablet (20 mg total) by mouth 2 (two) times daily before a meal.   potassium chloride 10 MEQ tablet Commonly known as:   K-DUR,KLOR-CON Take 10 mEq by mouth 2 (two) times daily.            Home Infusion Instuctions  (From admission, onward)         Start     Ordered   06/19/18 0000  Home infusion instructions [facility] May follow [place] Dosing Protocol; May administer Cathflo as needed to maintain patency of vascular access device.; Flushing of vascular access device: per [facility] Protocol: 0.9% NaCl pre/post medica...    Question Answer Comment  Instructions May follow [place] Dosing Protocol   Instructions May administer Cathflo as needed to maintain patency of vascular access device.   Instructions Flushing of vascular access device: per [facility] Protocol: 0.9% NaCl pre/post medication administration and prn patency; Heparin 100 u/ml, 3m for implanted ports and Heparin 10u/ml, 570mfor all other central venous catheters.   Instructions May follow [facility] Anaphylaxis Protocol for First Dose Administration in the home: 0.9% NaCl at 25-50 ml/hr to maintain IV access for protocol meds. Epinephrine 0.3 ml IV/IM PRN and Benadryl 25-50 IV/IM PRN s/s of anaphylaxis.   Instructions [facility] Infusion Coordinator (RN) to assist per patient IV care needs in the home PRN.      06/19/18 0859          Diagnostic Studies: Dg Ankle Complete Right  Result Date: 06/08/2018 CLINICAL DATA:  Acute RIGHT ankle pain for 1 day. No known injury. Initial encounter. EXAM: RIGHT ANKLE - COMPLETE 3+ VIEW FINDINGS: No acute fracture, subluxation or dislocation. Mild soft tissue swelling noted. Small calcaneal spur and mild degenerative changes in the midfoot noted. IMPRESSION: Mild soft tissue swelling without acute bony abnormality. Electronically Signed   By: JeMargarette Canada.D.   On: 06/08/2018 13:50   Mr Ankle Right Wo Contrast  Result Date: 06/14/2018 CLINICAL DATA:  Ankle pain, in right leg, ankle and foot swelling. EXAM: MRI OF THE RIGHT ANKLE WITHOUT CONTRAST TECHNIQUE: Multiplanar, multisequence MR imaging  of the ankle was performed. No intravenous contrast was  administered. COMPARISON:  None. FINDINGS: TENDONS Peroneal: Peroneal longus tendon intact. Peroneal brevis intact. Posteromedial: Posterior tibial tendon intact. Flexor hallucis longus tendon intact. Flexor digitorum longus tendon intact. Small amount of fluid in the flexor digitorum, flexor hallucis longus and posterior tibial tendon sheaths as can be seen with tenosynovitis. Anterior: Tibialis anterior tendon intact. Extensor hallucis longus tendon intact Extensor digitorum longus tendon intact. Achilles: Mild-moderate tendinosis of the Achilles tendon without a tear. Plantar Fascia: Thickening and increased signal of the medial band of the plantar fascia at the calcaneal insertion with a partial-thickness tear. LIGAMENTS Lateral: Anterior talofibular ligament intact. Calcaneofibular ligament intact. Posterior talofibular ligament intact. Anterior and posterior tibiofibular ligaments intact. Medial: Deltoid ligament intact. Spring ligament intact. CARTILAGE Ankle Joint: Moderate ankle joint effusion with synovitis 7 mm osteochondral lesion involving the lateral corner of the talar dome with subchondral marrow edema and partial-thickness overlying cartilage loss. Subtalar Joints/Sinus Tarsi: Normal subtalar joints. No subtalar joint effusion. Normal sinus tarsi. Bones: No marrow signal abnormality.  No fracture or dislocation. Soft Tissue: No fluid collection or hematoma. No soft tissue mass. Soft tissue edema along the dorsal aspect of the foot and extending into the medial and lateral aspect of the ankle. IMPRESSION: 1. Mild-moderate tendinosis of the Achilles tendon without a tear. 2. Moderate plantar fasciitis of the medial band of the plantar fascia at the calcaneal insertion with a small partial-thickness tear. 3. 7 mm osteochondral lesion involving the lateral corner of the talar dome with subchondral marrow edema and partial-thickness overlying  cartilage loss. 4. Small amount of fluid in the flexor digitorum, flexor hallucis longus and posterior tibial tendon sheaths as can be seen with tenosynovitis. Electronically Signed   By: Kathreen Devoid   On: 06/14/2018 14:24   US Venous Img Lower Unilateral Right  Result Date: 06/08/2018 CLINICAL DATA:  Right lower extremity pain and edema. EXAM: RIGHT LOWER EXTREMITY VENOUS DOPPLER ULTRASOUND TECHNIQUE: Gray-scale sonography with graded compression, as well as color Doppler and duplex ultrasound were performed to evaluate the lower extremity deep venous systems from the level of the common femoral vein and including the common femoral, femoral, profunda femoral, popliteal and calf veins including the posterior tibial, peroneal and gastrocnemius veins when visible. The superficial great saphenous vein was also interrogated. Spectral Doppler was utilized to evaluate flow at rest and with distal augmentation maneuvers in the common femoral, femoral and popliteal veins. COMPARISON:  None. FINDINGS: Contralateral Common Femoral Vein: Respiratory phasicity is normal and symmetric with the symptomatic side. No evidence of thrombus. Normal compressibility. Common Femoral Vein: No evidence of thrombus. Normal compressibility, respiratory phasicity and response to augmentation. Saphenofemoral Junction: No evidence of thrombus. Normal compressibility and flow on color Doppler imaging. Profunda Femoral Vein: No evidence of thrombus. Normal compressibility and flow on color Doppler imaging. Femoral Vein: No evidence of thrombus. Normal compressibility, respiratory phasicity and response to augmentation. Popliteal Vein: No evidence of thrombus. Normal compressibility, respiratory phasicity and response to augmentation. Calf Veins: No evidence of thrombus. Normal compressibility and flow on color Doppler imaging. Superficial Great Saphenous Vein: No evidence of thrombus. Normal compressibility. Venous Reflux:  None. Other  Findings: No evidence of superficial thrombophlebitis or abnormal fluid collection. IMPRESSION: No evidence of right lower extremity deep venous thrombosis. Electronically Signed   By: Aletta Edouard M.D.   On: 06/08/2018 15:41   Dg Chest Port 1 View  Result Date: 06/11/2018 CLINICAL DATA:  Central line placement EXAM: PORTABLE CHEST 1 VIEW COMPARISON:  05/23/2018 chest radiograph. FINDINGS:  Right internal jugular central venous catheter terminates at the cavoatrial junction. Stable cardiomediastinal silhouette with normal heart size. No pneumothorax. No pleural effusion. Lungs appear clear, with no acute consolidative airspace disease and no pulmonary edema. IMPRESSION: Right internal jugular central venous catheter terminates at the cavoatrial junction. No pneumothorax. No active cardiopulmonary disease. Electronically Signed   By: Ilona Sorrel M.D.   On: 06/11/2018 23:38   Irpicc Placement Left >5 Yrs Inc Img Guide  Result Date: 06/17/2018 INDICATION: Poor venous access. In need of durable intravenous access for antibiotic administration. EXAM: ULTRASOUND AND FLUOROSCOPIC GUIDED PICC LINE INSERTION MEDICATIONS: None. CONTRAST:  None FLUOROSCOPY TIME:  54 seconds (3.3 mGy) COMPLICATIONS: None immediate. TECHNIQUE: The procedure, risks, benefits, and alternatives were explained to the patient and informed written consent was obtained. A timeout was performed prior to the initiation of the procedure. The left upper extremity was prepped with chlorhexidine in a sterile fashion, and a sterile drape was applied covering the operative field. Maximum barrier sterile technique with sterile gowns and gloves were used for the procedure. A timeout was performed prior to the initiation of the procedure. Local anesthesia was provided with 1% lidocaine. Under direct ultrasound guidance, the brachial vein was accessed with a micropuncture kit after the overlying soft tissues were anesthetized with 1% lidocaine. After  the overlying soft tissues were anesthetized, a small venotomy incision was created and a micropuncture kit was utilized to access the left brachial vein. Real-time ultrasound guidance was utilized for vascular access including the acquisition of a permanent ultrasound image documenting patency of the accessed vessel. A guidewire was advanced to the level of the superior caval-atrial junction for measurement purposes and the PICC line was cut to length. A peel-away sheath was placed and a 47 cm, 5 [place], dual lumen was inserted to level of the superior caval-atrial junction. A post procedure spot fluoroscopic was obtained. The catheter easily aspirated and flushed and was sutured in place. A dressing was placed. The patient tolerated the procedure well without immediate post procedural complication. FINDINGS: After catheter placement, the tip lies within the superior cavoatrial junction. The catheter aspirates and flushes normally and is ready for immediate use. IMPRESSION: Successful ultrasound and fluoroscopic guided placement of a left brachial vein approach, 47 cm, 5 French, dual lumen PICC with tip at the superior caval-atrial junction. The PICC line is ready for immediate use. Electronically Signed   By: Sandi Mariscal M.D.   On: 06/17/2018 11:21   [place] Ekg Site Rite  Result Date: 06/13/2018 If Site Rite image not attached, placement could not be confirmed due to current cardiac rhythm.   Discharge Instructions    Call MD / Call 911   Complete by:  As directed    If you experience chest pain or shortness of breath, CALL 911 and be transported to the hospital emergency room.  If you develope a fever above 101 F, pus (white drainage) or increased drainage or redness at the wound, or calf pain, call your surgeon's office.   Constipation Prevention   Complete by:  As directed    Drink plenty of fluids.  Prune juice may be helpful.  You may use a stool softener, such as Colace (over the counter) 100 mg  twice a day.  Use MiraLax (over the counter) for constipation as needed.   Diet - low sodium heart healthy   Complete by:  As directed    Discharge instructions   Complete by:  As directed  Call if there is increasing drainage, fever greater than 101.5, severe head aches, and worsening nausea or light sensitivity. If shortness of breath, bloody cough or chest tightness or pain go to an emergency room. No lifting greater than 10 lbs. Avoid bending, stooping and twisting. Use brace when sitting and out of bed even to go to bathroom. Walk in house for first 2 weeks then may start to get out slowly increasing distances up to one quarter mile by 4-6 weeks post op. Keep VAC intact and HHN will change VAC weekly use an moisture impervious dressing. Please call and return for scheduled follow up appointment 1 weeks from the time of surgery. You have a PICCU line in your left arm and this requires periodic irrigation with a blood thinner to keep open please obey the instructions by  Nursing concerning the sterility and care of this important IV access line. The line allows for your use of antibiotics IV for 6 weeks without more Needle and IV sticks and allows for blood to be drawn rather than obtained with more needle sticks.  Call our office to arrange for a weekly visit to have the VAC removed and wet to dry dressing temporarily placed until [facility] can change the dressing later in the Day at home following your office visit.  Make an appointment to see Dr. Johnnye Sima infectious disease for follow up appointment in 4 weeks. HHN will help direct your IV antibiotic treatment and VAC changes.   Driving restrictions   Complete by:  As directed    No driving for 12 weeks   Home infusion instructions [place] May follow [place] Dosing Protocol; May administer Cathflo as needed to maintain patency of vascular access device.; Flushing of vascular access device: per [facility] Protocol: 0.9% NaCl pre/post  medica...   Complete by:  As directed    Instructions:  May follow [place] Dosing Protocol   Instructions:  May administer Cathflo as needed to maintain patency of vascular access device.   Instructions:  Flushing of vascular access device: per [facility] Protocol: 0.9% NaCl pre/post medication administration and prn patency; Heparin 100 u/ml, 44m for implanted ports and Heparin 10u/ml, 545mfor all other central venous catheters.   Instructions:  May follow [facility] Anaphylaxis Protocol for First Dose Administration in the home: 0.9% NaCl at 25-50 ml/hr to maintain IV access for protocol meds. Epinephrine 0.3 ml IV/IM PRN and Benadryl 25-50 IV/IM PRN s/s of anaphylaxis.   Instructions:  [facility] Coordinator (RN) to assist per patient IV care needs in the home PRN.   Increase activity slowly as tolerated   Complete by:  As directed    Lifting restrictions   Complete by:  As directed    No lifting for 12 weeks      Follow-up Information    NiJessy OtoMD In 1 week.   Specialty:  Orthopedic Surgery Why:  For wound re-check Contact information: [address] [id number].   Specialty:  [place]:  A representative from [facility] contact you to arrange start date and time to resume your services.  Contact information: [phone]        HaCampbell RichesMD Follow up on 07/22/2018.   Specialty:  Infectious Diseases Why:  10:45 am appointment. Please arrive 15 minutes early and  call [phone] to reschedule if needed.  Contact information: [place] [address] [phone]        [facility] Follow up.   Specialty:  [place] Why:  A representative from [facility] will contact you to arrange deliver of your antibiotics.  Contact  information: [address] [place] [address] [phone]           Discharge Plan:  discharge to home  Disposition:     Signed: Benjiman Core  06/26/2018, 4:11 PM

## 2018-06-30 ENCOUNTER — Ambulatory Visit (INDEPENDENT_AMBULATORY_CARE_PROVIDER_SITE_OTHER): Payer: Self-pay

## 2018-06-30 ENCOUNTER — Ambulatory Visit (INDEPENDENT_AMBULATORY_CARE_PROVIDER_SITE_OTHER): Payer: Medicare Other | Admitting: Specialist

## 2018-06-30 ENCOUNTER — Telehealth (INDEPENDENT_AMBULATORY_CARE_PROVIDER_SITE_OTHER): Payer: Self-pay | Admitting: Specialist

## 2018-06-30 ENCOUNTER — Encounter (INDEPENDENT_AMBULATORY_CARE_PROVIDER_SITE_OTHER): Payer: Self-pay | Admitting: Specialist

## 2018-06-30 VITALS — BP 164/90 | HR 86 | Ht 63.0 in | Wt 279.6 lb

## 2018-06-30 DIAGNOSIS — M869 Osteomyelitis, unspecified: Secondary | ICD-10-CM

## 2018-06-30 DIAGNOSIS — M4626 Osteomyelitis of vertebra, lumbar region: Secondary | ICD-10-CM

## 2018-06-30 MED ORDER — OXYCODONE-ACETAMINOPHEN 10-325 MG PO TABS: 1.0000 | ORAL_TABLET | Freq: Four times a day (QID) | ORAL | 0 refills | Status: DC | PRN

## 2018-06-30 NOTE — Telephone Encounter (Signed)
Dr. Otelia Sergeant spoke with her.

## 2018-06-30 NOTE — Progress Notes (Signed)
Post-Op Visit Note   Patient: Marilyn [name]           Date of Birth: [birth year]           MRN: [id number] Visit Date: 06/30/2018 PCP: Joette Catching, MD   Assessment & Plan:34 days post L4 to S1 2 level TLIF for DDD and spondylolisthesis  Chief Complaint:  Chief Complaint  Patient presents with  . Lower Back - Wound Check   Visit Diagnoses:  1. Infection of lumbar spine (HCC)   VAC in place no induration Legs are NV normal I called and the [facility] nurse [phone] and she will change the dressing VAC on Wed after we see her First in the AM and then place a wet to dry dressing. Plan:    Call if there is increasing drainage, fever greater than 101.5, severe head aches, and worsening nausea or light sensitivity. If shortness of breath, bloody cough or chest tightness or pain go to an emergency room. No lifting greater than 10 lbs. Avoid bending, stooping and twisting. Use brace when sitting and out of bed even to go to bathroom. Walk in house for first 2 weeks then may start to get out slowly increasing distances up to one mile by 4-6 weeks post op. May shower and change dressing following bathing with shower.When bathing remove the brace shower and replace brace before getting out of the shower. If drainage, keep dry dressing and do not bathe the incision, use an moisture impervious dressing.     Follow-Up Instructions: No follow-ups on file.   Orders:  Orders Placed This Encounter  Procedures  . XR Lumbar Spine 2-3 Views   No orders of the defined types were placed in this encounter.   Imaging: No results found.  PMFS History: Patient Active Problem List   Diagnosis Date Noted  . Anemia due to blood loss, acute 06/18/2018    Priority: High    Class: Acute  . Superficial incisional surgical site infection 06/11/2018    Priority: High    Class: Acute  . Right ankle effusion 06/11/2018    Priority: High    Class: Acute  . Morbid (severe) obesity due to  excess calories (HCC) 05/29/2018    Priority: High    Class: Chronic  . Spondylolisthesis, lumbar region 05/27/2018    Priority: High    Class: Chronic  . Spinal stenosis of lumbar region 05/27/2018    Priority: High    Class: Chronic  . Urinary tract infection 05/27/2018    Priority: High    Class: Acute  . Plantar fasciitis of left foot 06/16/2018    Priority: Medium    Class: Chronic  . Tendonitis, Achilles, right 06/16/2018    Priority: Medium    Class: Chronic  . Osteochondral talar dome lesion 06/16/2018    Priority: Medium    Class: Chronic  . Deep incisional surgical site infection   . Infected incision 06/12/2018  . Fusion of spine of lumbar region 05/27/2018  . Pain of right hip joint 07/03/2017  . Acute right-sided low back pain with right-sided sciatica 03/27/2017  . Chronic right shoulder pain 02/13/2017  . History of rotator cuff tear 11/30/2016  . Impingement syndrome of right shoulder 11/30/2016  . Exacerbation of asthma 07/18/2016  . Hypokalemia 07/18/2016  . Hypertension 07/18/2016  . Depression with anxiety 07/18/2016  . Hypothyroidism 07/18/2016  . Hyperglycemia 07/18/2016  . Acute asthma exacerbation 07/18/2016  . Surgical wound dehiscence left hip; questionable superficial infection  11/10/2015  . Left hip postoperative wound infection 11/10/2015  . Osteoarthritis of left hip 09/09/2015  . 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 Obesity (BMI 35.0-39.9 without comorbidity) 04/28/2013   Past Medical History:  Diagnosis Date  . Anemia    taking iron now. pt states having no current issues 09/02/2015  . Anginal pain (HCC)    pt states experiences chest wall pain pt states related to her asthma   . Anxiety    with MRI's  . Arthritis    Everywhere  . Asthma   . Dizziness   . GERD (gastroesophageal reflux disease)   . Headache(784.0)    HX OF MIGRAINES  . History of bronchitis   . Hypertension   . Hypothyroidism    takes  levothyroxen  . OSA on CPAP   . Shortness of breath    with exertion  . Wears glasses     Family History  Problem Relation Age of Onset  . Cancer Mother        colon  . Epilepsy Mother   . Cancer Father        prostate  . Diabetes Father   . Hypertension Father   . Hypertension Maternal Aunt   . Diabetes Maternal Aunt   . Hypertension Paternal Aunt     Past Surgical History:  Procedure Laterality Date  . CESAREAN SECTION     times 2  . CHOLECYSTECTOMY    . ENDOMETRIAL ABLATION    . INCISION AND DRAINAGE HIP Left 11/10/2015   Procedure: IRRIGATION AND DEBRIDEMENT LEFT HIP INCISION;  Surgeon: Kathryne Hitch, MD;  Location: [facility] OR;  Service: Orthopedics;  Laterality: Left;  . JOINT REPLACEMENT  2011   total left knee  . KNEE ARTHROPLASTY  04/23/2012   right   . KNEE ARTHROSCOPY    . LUMBAR WOUND DEBRIDEMENT N/A 06/11/2018   Procedure: LUMBAR WOUND DEBRIDEMENT;  Surgeon: Kerrin Champagne, MD;  Location: [facility] OR;  Service: Orthopedics;  Laterality: N/A;  . ROTATOR CUFF REPAIR     left   . SHOULDER SURGERY     right to repair ligament tear  . TOTAL HIP ARTHROPLASTY Left 09/09/2015   Procedure: LEFT TOTAL HIP ARTHROPLASTY ANTERIOR APPROACH;  Surgeon: Kathryne Hitch, MD;  Location: [facility] ORS;  Service: Orthopedics;  Laterality: Left;  . TOTAL KNEE ARTHROPLASTY  04/23/2012   Procedure: TOTAL KNEE ARTHROPLASTY;  Surgeon: [name], MD;  Location: [facility] OR;  Service: Orthopedics;  Laterality: Right;  Right Total Knee Arthroplasty  . TUBAL LIGATION  1996   Social History   Occupational History  . Not on file  Tobacco Use  . Smoking status: Former Smoker    Packs/day: 0.50    Years: 4.00    Pack years: 2.00    Last attempt to quit: 09/18/1991    Years since quitting: 26.8  . Smokeless tobacco: Never Used  Substance and Sexual Activity  . Alcohol use: No  . Drug use: No  . Sexual activity: Yes    Birth control/protection: Surgical

## 2018-06-30 NOTE — Telephone Encounter (Signed)
A RN with [facility] left a vm concerning the patients wound vac dressing change, she said she needs approval to see patient tomorrow to do so or if she can bring it with her today. She was needing a call back for approval or to let her know otherwise. # [phone]

## 2018-06-30 NOTE — Patient Instructions (Signed)
Call if there is increasing drainage, fever greater than 101.5, severe head aches, and worsening nausea or light sensitivity. If shortness of breath, bloody cough or chest tightness or pain go to an emergency room. No lifting greater than 10 lbs. Avoid bending, stooping and twisting. Use brace when sitting and out of bed even to go to bathroom. Walk in house for first 2 weeks then may start to get out slowly increasing distances up to one mile by 4-6 weeks post op. May shower and change dressing following bathing with shower.When bathing remove the brace shower and replace brace before getting out of the shower. If drainage, keep dry dressing and do not bathe the incision, use an moisture impervious dressing.

## 2018-07-02 ENCOUNTER — Encounter (INDEPENDENT_AMBULATORY_CARE_PROVIDER_SITE_OTHER): Payer: Self-pay | Admitting: Specialist

## 2018-07-02 ENCOUNTER — Ambulatory Visit (INDEPENDENT_AMBULATORY_CARE_PROVIDER_SITE_OTHER): Payer: Medicare Other | Admitting: Specialist

## 2018-07-02 VITALS — BP 133/76 | HR 88 | Ht 63.0 in | Wt 279.6 lb

## 2018-07-02 DIAGNOSIS — Z981 Arthrodesis status: Secondary | ICD-10-CM

## 2018-07-02 DIAGNOSIS — T8141XA Infection following a procedure, superficial incisional surgical site, initial encounter: Secondary | ICD-10-CM

## 2018-07-02 NOTE — Patient Instructions (Signed)
Plan: Avoid frequent bending and stooping  No lifting greater than 10 lbs. May use ice or moist heat for pain. Weight loss is of benefit. Best medication for lumbar disc disease is arthritis medications like motrin, celebrex and naprosyn. Exercise is important to improve your indurance and does allow people to function better inspite of back pain. Keep dressing intact and incision wound dry. HHN to apply VAC this afternoon.  Return appt in one week wed AM.

## 2018-07-02 NOTE — Progress Notes (Signed)
Post-Op Visit Note   Patient: Marilyn [name]           Date of Birth: [birth year]           MRN: [id number] Visit Date: 07/02/2018 PCP: Joette Catching, MD   Assessment & Plan:Lumbar fusion 36 days  Chief Complaint:  Chief Complaint  Patient presents with  . Lower Back - Pain, Routine Post Op, Wound Check, Follow-up   Visit Diagnoses:  1. S/P lumbar fusion   2. Superficial incisional surgical site infection   Incision evaluated, VAC removed, small area right side debrided of dead fat, normal saline wet to dry dressing.  Plan: Avoid frequent bending and stooping  No lifting greater than 10 lbs. May use ice or moist heat for pain. Weight loss is of benefit. Best medication for lumbar disc disease is arthritis medications like motrin, celebrex and naprosyn. Exercise is important to improve your indurance and does allow people to function better inspite of back pain. Keep dressing intact and incision wound dry. HHN to apply VAC this afternoon.  Return appt in one week wed AM.    Follow-Up Instructions: No follow-ups on file.   Orders:  No orders of the defined types were placed in this encounter.  No orders of the defined types were placed in this encounter.   Imaging: No results found.  PMFS History: Patient Active Problem List   Diagnosis Date Noted  . Anemia due to blood loss, acute 06/18/2018    Priority: High    Class: Acute  . Superficial incisional surgical site infection 06/11/2018    Priority: High    Class: Acute  . Right ankle effusion 06/11/2018    Priority: High    Class: Acute  . Morbid (severe) obesity due to excess calories (HCC) 05/29/2018    Priority: High    Class: Chronic  . Spondylolisthesis, lumbar region 05/27/2018    Priority: High    Class: Chronic  . Spinal stenosis of lumbar region 05/27/2018    Priority: High    Class: Chronic  . Urinary tract infection 05/27/2018    Priority: High    Class: Acute  . Plantar fasciitis of  left foot 06/16/2018    Priority: Medium    Class: Chronic  . Tendonitis, Achilles, right 06/16/2018    Priority: Medium    Class: Chronic  . Osteochondral talar dome lesion 06/16/2018    Priority: Medium    Class: Chronic  . Deep incisional surgical site infection   . Infected incision 06/12/2018  . Fusion of spine of lumbar region 05/27/2018  . Pain of right hip joint 07/03/2017  . Acute right-sided low back pain with right-sided sciatica 03/27/2017  . Chronic right shoulder pain 02/13/2017  . History of rotator cuff tear 11/30/2016  . Impingement syndrome of right shoulder 11/30/2016  . Exacerbation of asthma 07/18/2016  . Hypokalemia 07/18/2016  . Hypertension 07/18/2016  . Depression with anxiety 07/18/2016  . Hypothyroidism 07/18/2016  . Hyperglycemia 07/18/2016  . Acute asthma exacerbation 07/18/2016  . Surgical wound dehiscence left hip; questionable superficial infection 11/10/2015  . Left hip postoperative wound infection 11/10/2015  . Osteoarthritis of left hip 09/09/2015  . 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 Obesity (BMI 35.0-39.9 without comorbidity) 04/28/2013   Past Medical History:  Diagnosis Date  . Anemia    taking iron now. pt states having no current issues 09/02/2015  . Anginal pain (HCC)    pt states experiences chest wall pain  pt states related to her asthma   . Anxiety    with MRI's  . Arthritis    Everywhere  . Asthma   . Dizziness   . GERD (gastroesophageal reflux disease)   . Headache(784.0)    HX OF MIGRAINES  . History of bronchitis   . Hypertension   . Hypothyroidism    takes levothyroxen  . OSA on CPAP   . Shortness of breath    with exertion  . Wears glasses     Family History  Problem Relation Age of Onset  . Cancer Mother        colon  . Epilepsy Mother   . Cancer Father        prostate  . Diabetes Father   . Hypertension Father   . Hypertension Maternal Aunt   . Diabetes Maternal Aunt   .  Hypertension Paternal Aunt     Past Surgical History:  Procedure Laterality Date  . CESAREAN SECTION     times 2  . CHOLECYSTECTOMY    . ENDOMETRIAL ABLATION    . INCISION AND DRAINAGE HIP Left 11/10/2015   Procedure: IRRIGATION AND DEBRIDEMENT LEFT HIP INCISION;  Surgeon: Kathryne Hitch, MD;  Location: [facility] OR;  Service: Orthopedics;  Laterality: Left;  . JOINT REPLACEMENT  2011   total left knee  . KNEE ARTHROPLASTY  04/23/2012   right   . KNEE ARTHROSCOPY    . LUMBAR WOUND DEBRIDEMENT N/A 06/11/2018   Procedure: LUMBAR WOUND DEBRIDEMENT;  Surgeon: Kerrin Champagne, MD;  Location: [facility] OR;  Service: Orthopedics;  Laterality: N/A;  . ROTATOR CUFF REPAIR     left   . SHOULDER SURGERY     right to repair ligament tear  . TOTAL HIP ARTHROPLASTY Left 09/09/2015   Procedure: LEFT TOTAL HIP ARTHROPLASTY ANTERIOR APPROACH;  Surgeon: Kathryne Hitch, MD;  Location: [facility] ORS;  Service: Orthopedics;  Laterality: Left;  . TOTAL KNEE ARTHROPLASTY  04/23/2012   Procedure: TOTAL KNEE ARTHROPLASTY;  Surgeon: [name], MD;  Location: [facility] OR;  Service: Orthopedics;  Laterality: Right;  Right Total Knee Arthroplasty  . TUBAL LIGATION  1996   Social History   Occupational History  . Not on file  Tobacco Use  . Smoking status: Former Smoker    Packs/day: 0.50    Years: 4.00    Pack years: 2.00    Last attempt to quit: 09/18/1991    Years since quitting: 26.8  . Smokeless tobacco: Never Used  Substance and Sexual Activity  . Alcohol use: No  . Drug use: No  . Sexual activity: Yes    Birth control/protection: Surgical

## 2018-07-09 ENCOUNTER — Ambulatory Visit (INDEPENDENT_AMBULATORY_CARE_PROVIDER_SITE_OTHER): Payer: Medicare Other | Admitting: Specialist

## 2018-07-09 ENCOUNTER — Encounter (INDEPENDENT_AMBULATORY_CARE_PROVIDER_SITE_OTHER): Payer: Self-pay | Admitting: Specialist

## 2018-07-09 VITALS — BP 150/85 | HR 101 | Temp 98.7°F | Ht 63.0 in | Wt 279.0 lb

## 2018-07-09 DIAGNOSIS — Z981 Arthrodesis status: Secondary | ICD-10-CM

## 2018-07-09 DIAGNOSIS — T8149XA Infection following a procedure, other surgical site, initial encounter: Secondary | ICD-10-CM

## 2018-07-09 MED ORDER — OXYCODONE-ACETAMINOPHEN 10-325 MG PO TABS: 1.0000 | ORAL_TABLET | Freq: Four times a day (QID) | ORAL | 0 refills | Status: DC | PRN

## 2018-07-09 NOTE — Patient Instructions (Signed)
Plan: VAC dressing changes,    Call if there is increasing drainage, fever greater than 101.5, severe head aches, and worsening nausea or light sensitivity. If shortness of breath, bloody cough or chest tightness or pain go to an emergency room. No lifting greater than 10 lbs. Avoid bending, stooping and twisting. Use brace when sitting and out of bed even to go to bathroom. Walk in house for first 2 weeks then may start to get out slowly increasing distances up to one mile by 4-6 weeks post op. After 5 days may shower and change dressing following bathing with shower.When bathing remove the brace shower and replace brace before getting out of the shower.

## 2018-07-09 NOTE — Progress Notes (Signed)
Post-Op Visit Note   Patient: Marilyn [name]           Date of Birth: 1971/07/31           MRN: [id number] Visit Date: 07/09/2018 PCP: Joette Catching, MD   Assessment & Plan: Post op day 30 Incision and drainage of subcutaneous seroma   Chief Complaint:  Chief Complaint  Patient presents with  . Lower Back - Wound Check   Visit Diagnoses: No diagnosis found.  Plan: VAC dressing changes,    Call if there is increasing drainage, fever greater than 101.5, severe head aches, and worsening nausea or light sensitivity. If shortness of breath, bloody cough or chest tightness or pain go to an emergency room. No lifting greater than 10 lbs. Avoid bending, stooping and twisting. Use brace when sitting and out of bed even to go to bathroom. Walk in house for first 2 weeks then may start to get out slowly increasing distances up to one mile by 4-6 weeks post op. After 5 days may shower and change dressing following bathing with shower.When bathing remove the brace shower and replace brace before getting out of the shower.     Follow-Up Instructions: No follow-ups on file.   Orders:  No orders of the defined types were placed in this encounter.  No orders of the defined types were placed in this encounter.   Imaging: No results found.  PMFS History: Patient Active Problem List   Diagnosis Date Noted  . Anemia due to blood loss, acute 06/18/2018    Priority: High    Class: Acute  . Superficial incisional surgical site infection 06/11/2018    Priority: High    Class: Acute  . Right ankle effusion 06/11/2018    Priority: High    Class: Acute  . Morbid (severe) obesity due to excess calories (HCC) 05/29/2018    Priority: High    Class: Chronic  . Spondylolisthesis, lumbar region 05/27/2018    Priority: High    Class: Chronic  . Spinal stenosis of lumbar region 05/27/2018    Priority: High    Class: Chronic  . Urinary tract infection 05/27/2018    Priority: High     Class: Acute  . Plantar fasciitis of left foot 06/16/2018    Priority: Medium    Class: Chronic  . Tendonitis, Achilles, right 06/16/2018    Priority: Medium    Class: Chronic  . Osteochondral talar dome lesion 06/16/2018    Priority: Medium    Class: Chronic  . Deep incisional surgical site infection   . Infected incision 06/12/2018  . Fusion of spine of lumbar region 05/27/2018  . Pain of right hip joint 07/03/2017  . Acute right-sided low back pain with right-sided sciatica 03/27/2017  . Chronic right shoulder pain 02/13/2017  . History of rotator cuff tear 11/30/2016  . Impingement syndrome of right shoulder 11/30/2016  . Exacerbation of asthma 07/18/2016  . Hypokalemia 07/18/2016  . Hypertension 07/18/2016  . Depression with anxiety 07/18/2016  . Hypothyroidism 07/18/2016  . Hyperglycemia 07/18/2016  . Acute asthma exacerbation 07/18/2016  . Surgical wound dehiscence left hip; questionable superficial infection 11/10/2015  . Left hip postoperative wound infection 11/10/2015  . Osteoarthritis of left hip 09/09/2015  . 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 Obesity (BMI 35.0-39.9 without comorbidity) 04/28/2013   Past Medical History:  Diagnosis Date  . Anemia    taking iron now. pt states having no current issues 09/02/2015  .  Anginal pain (HCC)    pt states experiences chest wall pain pt states related to her asthma   . Anxiety    with MRI's  . Arthritis    Everywhere  . Asthma   . Dizziness   . GERD (gastroesophageal reflux disease)   . Headache(784.0)    HX OF MIGRAINES  . History of bronchitis   . Hypertension   . Hypothyroidism    takes levothyroxen  . OSA on CPAP   . Shortness of breath    with exertion  . Wears glasses     Family History  Problem Relation Age of Onset  . Cancer Mother        colon  . Epilepsy Mother   . Cancer Father        prostate  . Diabetes Father   . Hypertension Father   . Hypertension Maternal Aunt    . Diabetes Maternal Aunt   . Hypertension Paternal Aunt     Past Surgical History:  Procedure Laterality Date  . CESAREAN SECTION     times 2  . CHOLECYSTECTOMY    . ENDOMETRIAL ABLATION    . INCISION AND DRAINAGE HIP Left 11/10/2015   Procedure: IRRIGATION AND DEBRIDEMENT LEFT HIP INCISION;  Surgeon: Kathryne Hitch, MD;  Location: [facility] OR;  Service: Orthopedics;  Laterality: Left;  . JOINT REPLACEMENT  2011   total left knee  . KNEE ARTHROPLASTY  04/23/2012   right   . KNEE ARTHROSCOPY    . LUMBAR WOUND DEBRIDEMENT N/A 06/11/2018   Procedure: LUMBAR WOUND DEBRIDEMENT;  Surgeon: Kerrin Champagne, MD;  Location: [facility] OR;  Service: Orthopedics;  Laterality: N/A;  . ROTATOR CUFF REPAIR     left   . SHOULDER SURGERY     right to repair ligament tear  . TOTAL HIP ARTHROPLASTY Left 09/09/2015   Procedure: LEFT TOTAL HIP ARTHROPLASTY ANTERIOR APPROACH;  Surgeon: Kathryne Hitch, MD;  Location: [facility] ORS;  Service: Orthopedics;  Laterality: Left;  . TOTAL KNEE ARTHROPLASTY  04/23/2012   Procedure: TOTAL KNEE ARTHROPLASTY;  Surgeon: [name], MD;  Location: [facility] OR;  Service: Orthopedics;  Laterality: Right;  Right Total Knee Arthroplasty  . TUBAL LIGATION  1996   Social History   Occupational History  . Not on file  Tobacco Use  . Smoking status: Former Smoker    Packs/day: 0.50    Years: 4.00    Pack years: 2.00    Last attempt to quit: 09/18/1991    Years since quitting: 26.8  . Smokeless tobacco: Never Used  Substance and Sexual Activity  . Alcohol use: No  . Drug use: No  . Sexual activity: Yes    Birth control/protection: Surgical

## 2018-07-10 ENCOUNTER — Emergency Department (HOSPITAL_COMMUNITY): Payer: Medicare Other

## 2018-07-10 ENCOUNTER — Encounter (HOSPITAL_COMMUNITY): Payer: Self-pay

## 2018-07-10 ENCOUNTER — Telehealth (INDEPENDENT_AMBULATORY_CARE_PROVIDER_SITE_OTHER): Payer: Self-pay | Admitting: *Deleted

## 2018-07-10 ENCOUNTER — Emergency Department (HOSPITAL_COMMUNITY)
Admission: EM | Admit: 2018-07-10 | Discharge: 2018-07-10 | Disposition: A | Discharge status: Home / Self Care | Payer: Medicare Other | Attending: Emergency Medicine | Admitting: Emergency Medicine

## 2018-07-10 DIAGNOSIS — Z87891 Personal history of nicotine dependence: Secondary | ICD-10-CM | POA: Insufficient documentation

## 2018-07-10 DIAGNOSIS — Z91040 Latex allergy status: Secondary | ICD-10-CM | POA: Insufficient documentation

## 2018-07-10 DIAGNOSIS — Z452 Encounter for adjustment and management of vascular access device: Secondary | ICD-10-CM

## 2018-07-10 DIAGNOSIS — Z9049 Acquired absence of other specified parts of digestive tract: Secondary | ICD-10-CM | POA: Diagnosis not present

## 2018-07-10 DIAGNOSIS — Y828 Other medical devices associated with adverse incidents: Secondary | ICD-10-CM | POA: Diagnosis not present

## 2018-07-10 DIAGNOSIS — Z79899 Other long term (current) drug therapy: Secondary | ICD-10-CM | POA: Insufficient documentation

## 2018-07-10 DIAGNOSIS — F329 Major depressive disorder, single episode, unspecified: Secondary | ICD-10-CM | POA: Diagnosis not present

## 2018-07-10 DIAGNOSIS — J45909 Unspecified asthma, uncomplicated: Secondary | ICD-10-CM | POA: Insufficient documentation

## 2018-07-10 DIAGNOSIS — Z96642 Presence of left artificial hip joint: Secondary | ICD-10-CM | POA: Insufficient documentation

## 2018-07-10 DIAGNOSIS — E039 Hypothyroidism, unspecified: Secondary | ICD-10-CM | POA: Insufficient documentation

## 2018-07-10 DIAGNOSIS — T82898A Other specified complication of vascular prosthetic devices, implants and grafts, initial encounter: Secondary | ICD-10-CM | POA: Insufficient documentation

## 2018-07-10 DIAGNOSIS — F419 Anxiety disorder, unspecified: Secondary | ICD-10-CM | POA: Diagnosis not present

## 2018-07-10 DIAGNOSIS — I10 Essential (primary) hypertension: Secondary | ICD-10-CM | POA: Insufficient documentation

## 2018-07-10 DIAGNOSIS — T829XXA Unspecified complication of cardiac and vascular prosthetic device, implant and graft, initial encounter: Secondary | ICD-10-CM

## 2018-07-10 DIAGNOSIS — Z96653 Presence of artificial knee joint, bilateral: Secondary | ICD-10-CM | POA: Insufficient documentation

## 2018-07-10 LAB — COMPREHENSIVE METABOLIC PANEL
ALT: 15 U/L (ref 0–44)
AST: 27 U/L (ref 15–41)
Albumin: 3.3 g/dL — ABNORMAL LOW (ref 3.5–5.0)
Alkaline Phosphatase: 65 U/L (ref 38–126)
Anion gap: 9 (ref 5–15)
BUN: 10 mg/dL (ref 6–20)
CHLORIDE: 103 mmol/L (ref 98–111)
CO2: 24 mmol/L (ref 22–32)
CREATININE: 1.01 mg/dL — AB (ref 0.44–1.00)
Calcium: 9.5 mg/dL (ref 8.9–10.3)
Glucose, Bld: 100 mg/dL — ABNORMAL HIGH (ref 70–99)
POTASSIUM: 3.9 mmol/L (ref 3.5–5.1)
SODIUM: 136 mmol/L (ref 135–145)
Total Bilirubin: 0.8 mg/dL (ref 0.3–1.2)
Total Protein: 7.6 g/dL (ref 6.5–8.1)

## 2018-07-10 LAB — CBC WITH DIFFERENTIAL/PLATELET
Abs Immature Granulocytes: 0.02 10*3/uL (ref 0.00–0.07)
Basophils Absolute: 0 10*3/uL (ref 0.0–0.1)
Basophils Relative: 1 %
EOS PCT: 9 %
Eosinophils Absolute: 0.4 10*3/uL (ref 0.0–0.5)
HCT: 36.6 % (ref 36.0–46.0)
HEMOGLOBIN: 10.1 g/dL — AB (ref 12.0–15.0)
Immature Granulocytes: 1 %
LYMPHS ABS: 1.7 10*3/uL (ref 0.7–4.0)
LYMPHS PCT: 40 %
MCH: 24.8 pg — AB (ref 26.0–34.0)
MCHC: 27.6 g/dL — AB (ref 30.0–36.0)
MCV: 89.7 fL (ref 80.0–100.0)
Monocytes Absolute: 0.4 10*3/uL (ref 0.1–1.0)
Monocytes Relative: 9 %
Neutro Abs: 1.7 10*3/uL (ref 1.7–7.7)
Neutrophils Relative %: 40 %
Platelets: 271 10*3/uL (ref 150–400)
RBC: 4.08 MIL/uL (ref 3.87–5.11)
RDW: 14.5 % (ref 11.5–15.5)
WBC: 4.2 10*3/uL (ref 4.0–10.5)
nRBC: 0 % (ref 0.0–0.2)

## 2018-07-10 LAB — I-STAT BETA HCG BLOOD, ED (MC, WL, AP ONLY): I-stat hCG, quantitative: 6.7 m[IU]/mL — ABNORMAL HIGH (ref <5)

## 2018-07-10 LAB — I-STAT CG4 LACTIC ACID, ED: Lactic Acid, Venous: 1.57 mmol/L (ref 0.5–1.9)

## 2018-07-10 MED ORDER — OXYCODONE HCL 5 MG PO TABS
10.0000 mg | ORAL_TABLET | Freq: Once | ORAL | Status: AC
  Administered 2018-07-10: 10 mg via ORAL
  Filled 2018-07-10: qty 2

## 2018-07-10 NOTE — Discharge Instructions (Addendum)
Return here as needed.  Follow-up with your doctor for a recheck.  If there is any changing or worsening in your condition return here for a recheck.  I would suggest using bacitracin on the areas that are irritated.

## 2018-07-10 NOTE — Telephone Encounter (Signed)
Pt called to give FYI to Dr. Otelia Sergeant that she is headed to the ED for possible IV port infection. Just wanted to let him know.

## 2018-07-10 NOTE — ED Triage Notes (Signed)
Pt presents with infected wound to low back from laminectomy on 9/10.  Daughter noted wound to be infected 9/23 (has pictures) with sutures removed 9/25 - area was debrided and wound vac placed.  Pt has been on cefepime via PICC line q 12 hours with last dose 2200 last night.  Pt reports PICC insertion site pain x 2 days reports no difficulty with infusion but had difficulty drawing blood from either port yesterday.

## 2018-07-10 NOTE — ED Notes (Signed)
Pt aware of need for urine sample, provided specimen cup to pt

## 2018-07-11 NOTE — ED Provider Notes (Signed)
[facility] EMERGENCY DEPARTMENT Provider Note   CSN: [id number] Arrival date & time: 07/10/18  1150     History   Chief Complaint No chief complaint on file.   HPI Marilyn [name] is a 47 y.o. female.  HPI Patient presents to the emergency department with concerns about her PICC line.  The patient was concerned it may be infected.  The IV nurse saw the patient and felt that this was an irritation from the adherent that was used along with an adherent dressing.  The patient has no drainage or increased warmth or redness around the PICC line for the patient.  Patient does have some skin irritation where the dressing was placed.  The patient denies chest pain, shortness of breath, headache,blurred vision, neck pain, fever, cough, weakness, numbness, dizziness, anorexia, edema, abdominal pain, nausea, vomiting, diarrhea, rash, back pain, dysuria, hematemesis, bloody stool, near syncope, or syncope. Past Medical History:  Diagnosis Date  . Anemia    taking iron now. pt states having no current issues 09/02/2015  . Anginal pain ([place])    pt states experiences chest wall pain pt states related to her asthma   . Anxiety    with MRI's  . Arthritis    Everywhere  . Asthma   . Dizziness   . GERD (gastroesophageal reflux disease)   . Headache(784.0)    HX OF MIGRAINES  . History of bronchitis   . Hypertension   . Hypothyroidism    takes levothyroxen  . OSA on CPAP   . Shortness of breath    with exertion  . Wears glasses     Patient Active Problem List   Diagnosis Date Noted  . Anemia due to blood loss, acute 06/18/2018    Class: Acute  . Plantar fasciitis of left foot 06/16/2018    Class: Chronic  . Tendonitis, Achilles, right 06/16/2018    Class: Chronic  . Osteochondral talar dome lesion 06/16/2018    Class: Chronic  . Deep incisional surgical site infection   . Infected incision 06/12/2018  . Superficial incisional surgical site infection  06/11/2018    Class: Acute  . Right ankle effusion 06/11/2018    Class: Acute  . Morbid (severe) obesity due to excess calories ([place]) 05/29/2018    Class: Chronic  . Spondylolisthesis, lumbar region 05/27/2018    Class: Chronic  . Spinal stenosis of lumbar region 05/27/2018    Class: Chronic  . Urinary tract infection 05/27/2018    Class: Acute  . Fusion of spine of lumbar region 05/27/2018  . Pain of right hip joint 07/03/2017  . Acute right-sided low back pain with right-sided sciatica 03/27/2017  . Chronic right shoulder pain 02/13/2017  . History of rotator cuff tear 11/30/2016  . Impingement syndrome of right shoulder 11/30/2016  . Exacerbation of asthma 07/18/2016  . Hypokalemia 07/18/2016  . Hypertension 07/18/2016  . Depression with anxiety 07/18/2016  . Hypothyroidism 07/18/2016  . Hyperglycemia 07/18/2016  . Acute asthma exacerbation 07/18/2016  . Surgical wound dehiscence left hip; questionable superficial infection 11/10/2015  . Left hip postoperative wound infection 11/10/2015  . Osteoarthritis of left hip 09/09/2015  . 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 Obesity (BMI 35.0-39.9 without comorbidity) 04/28/2013    Past Surgical History:  Procedure Laterality Date  . CESAREAN SECTION     times 2  . CHOLECYSTECTOMY    . ENDOMETRIAL ABLATION    . INCISION AND DRAINAGE HIP Left 11/10/2015  Procedure: IRRIGATION AND DEBRIDEMENT LEFT HIP INCISION;  Surgeon: Mcarthur Rossetti, MD;  Location: [facility];  Service: Orthopedics;  Laterality: Left;  . JOINT REPLACEMENT  2011   total left knee  . KNEE ARTHROPLASTY  04/23/2012   right   . KNEE ARTHROSCOPY    . LUMBAR WOUND DEBRIDEMENT N/A 06/11/2018   Procedure: LUMBAR WOUND DEBRIDEMENT;  Surgeon: Jessy Oto, MD;  Location: [facility];  Service: Orthopedics;  Laterality: N/A;  . ROTATOR CUFF REPAIR     left   . SHOULDER SURGERY     right to repair ligament tear  . TOTAL HIP ARTHROPLASTY Left  09/09/2015   Procedure: LEFT TOTAL HIP ARTHROPLASTY ANTERIOR APPROACH;  Surgeon: Mcarthur Rossetti, MD;  Location: [facility];  Service: Orthopedics;  Laterality: Left;  . TOTAL KNEE ARTHROPLASTY  04/23/2012   Procedure: TOTAL KNEE ARTHROPLASTY;  Surgeon: Newt Minion, MD;  Location: [facility];  Service: Orthopedics;  Laterality: Right;  Right Total Knee Arthroplasty  . TUBAL LIGATION  1996     OB History    Gravida  2   Para  2   Term  2   Preterm      AB      Living  2     SAB      TAB      Ectopic      Multiple      Live Births               Home Medications    Prior to Admission medications   Medication Sig Start Date End Date Taking? Authorizing Provider  albuterol (PROVENTIL HFA;VENTOLIN HFA) 108 (90 BASE) MCG/ACT inhaler Inhale 2 puffs into the lungs every 6 (six) hours as needed for wheezing or shortness of breath.   Yes [provider]  albuterol (PROVENTIL) (2.5 MG/3ML) 0.083% nebulizer solution Take 3 mLs (2.5 mg total) by nebulization 3 (three) times daily. 07/19/16  Yes Thurnell Lose, MD  allopurinol (ZYLOPRIM) 100 MG tablet Take 1 tablet (100 mg total) by mouth 2 (two) times daily. 06/19/18  Yes Jessy Oto, MD  cetirizine (ZYRTEC) 10 MG tablet Take 10 mg by mouth daily as needed for allergies.    Yes [provider]  diclofenac sodium (VOLTAREN) 1 % GEL Apply 4 g topically 4 (four) times daily. 06/19/18  Yes Jessy Oto, MD  diphenhydrAMINE (BENADRYL) 25 MG tablet Take 1 tablet (25 mg total) by mouth every 6 (six) hours. Patient taking differently: Take 25 mg by mouth every 6 (six) hours as needed for itching or allergies.  09/29/15  Yes Jola Schmidt, MD  docusate sodium (COLACE) 100 MG capsule Take 1 capsule (100 mg total) by mouth 2 (two) times daily. 05/30/18  Yes Jessy Oto, MD  ergocalciferol (VITAMIN D2) 50000 units capsule Take 50,000 Units by mouth every Monday.    Yes [provider]  furosemide (LASIX) 20 MG  tablet Take 20 mg by mouth daily as needed for fluid.  11/01/17  Yes [provider]  NARCAN 4 MG/0.1ML LIQD nasal spray kit Place 0.4 mg into the nose once.  06/28/17  Yes [provider]  oxyCODONE-acetaminophen (PERCOCET) 10-325 MG tablet Take 1-2 tablets by mouth every 6 (six) hours as needed for pain. MAXIMUM TOTAL ACETAMINOPHEN DOSE IS 4000 MG PER DAY Patient taking differently: Take 1-2 tablets by mouth 3 (three) times daily. MAXIMUM TOTAL ACETAMINOPHEN DOSE IS 4000 MG PER DAY 07/09/18  Yes Basil Dess  E, MD  buprenorphine (SUBUTEX) 8 MG SUBL SL tablet Place 8 mg under the tongue 2 (two) times daily.     [provider]  ceFEPime (MAXIPIME) IVPB Inject 2 g into the vein every 12 (twelve) hours. Indication:   Deep incisional surgical site infection Last Day of Therapy:  07/24/18 Labs - Once weekly:  CBC/D and BMP, Labs - Every other week:  ESR and CRP Patient not taking: Reported on 07/10/2018 06/19/18 07/27/18  Jessy Oto, MD  ceFEPIme 2 g in sodium chloride 0.9 % 100 mL Inject 2 g into the vein every 12 (twelve) hours. Patient not taking: Reported on 07/10/2018 06/19/18   Jessy Oto, MD  cyclobenzaprine (FLEXERIL) 10 MG tablet TAKE 1 TABLET BY MOUTH THREE TIMES DAILY AS NEEDED FOR MUSCLE SPASM Patient taking differently: Take 10 mg by mouth 3 (three) times daily as needed for muscle spasms.  05/02/17   Mcarthur Rossetti, MD  ferrous gluconate (FERGON) 324 MG tablet Take 1 tablet (324 mg total) by mouth 2 (two) times daily with a meal. 05/30/18   Jessy Oto, MD  hydrochlorothiazide 25 MG tablet Take 25 mg by mouth daily.     [provider]  levothyroxine (SYNTHROID, LEVOTHROID) 137 MCG tablet Take 137 mcg by mouth daily before breakfast.  05/31/17   [provider]  metoCLOPramide (REGLAN) 5 MG tablet Take 5 mg by mouth 3 (three) times daily before meals.    [provider]  nutrition supplement, JUVEN, (JUVEN) PACK Take 1  packet by mouth 2 (two) times daily between meals. Patient not taking: Reported on 07/10/2018 06/19/18   Jessy Oto, MD  ondansetron (ZOFRAN ODT) 8 MG disintegrating tablet Take 1 tablet (8 mg total) by mouth every 8 (eight) hours as needed for nausea or vomiting. 03/03/18   Dorie Rank, MD  oxybutynin (DITROPAN-XL) 10 MG 24 hr tablet Take 10 mg by mouth daily.  05/31/17   [provider]  oxyCODONE (OXYCONTIN) 10 mg 12 hr tablet Take 1 tablet (10 mg total) by mouth every 12 (twelve) hours. Patient not taking: Reported on 07/10/2018 06/19/18   Jessy Oto, MD  pantoprazole (PROTONIX) 20 MG tablet Take 1 tablet (20 mg total) by mouth 2 (two) times daily before a meal. 03/13/18   Virgel Manifold, MD  potassium chloride (K-DUR,KLOR-CON) 10 MEQ tablet Take 10 mEq by mouth 2 (two) times daily.     [provider]    Family History Family History  Problem Relation Age of Onset  . Cancer Mother        colon  . Epilepsy Mother   . Cancer Father        prostate  . Diabetes Father   . Hypertension Father   . Hypertension Maternal Aunt   . Diabetes Maternal Aunt   . Hypertension Paternal Aunt     Social History Social History   Tobacco Use  . Smoking status: Former Smoker    Packs/day: 0.50    Years: 4.00    Pack years: 2.00    Last attempt to quit: 09/18/1991    Years since quitting: 26.8  . Smokeless tobacco: Never Used  Substance Use Topics  . Alcohol use: No  . Drug use: No     Allergies   Lisinopril; Bee venom; Propoxyphene; Codeine; Latex; Meloxicam; Morphine and related; and Tomato   Review of Systems Review of Systems All other systems negative except as documented in the HPI. All pertinent positives  and negatives as reviewed in the HPI.  Physical Exam Updated Vital Signs BP (!) 143/83   Pulse 83   Temp 98.6 F (37 C) (Oral)   Resp 16   SpO2 100%   Physical Exam  Constitutional: She is oriented to person, place, and time. She appears  well-developed and well-nourished. No distress.  HENT:  Head: Normocephalic and atraumatic.  Mouth/Throat: Oropharynx is clear and moist.  Eyes: Pupils are equal, round, and reactive to light.  Neck: Normal range of motion. Neck supple.  Cardiovascular: Normal rate, regular rhythm and normal heart sounds. Exam reveals no gallop and no friction rub.  No murmur heard. Pulmonary/Chest: Effort normal and breath sounds normal. No respiratory distress. She has no wheezes.  Neurological: She is alert and oriented to person, place, and time. She exhibits normal muscle tone. Coordination normal.  Skin: Skin is warm and dry. Capillary refill takes less than 2 seconds. No rash noted. No erythema.     Psychiatric: She has a normal mood and affect. Her behavior is normal.  Nursing note and vitals reviewed.    ED Treatments / Results  Labs (all labs ordered are listed, but only abnormal results are displayed) Labs Reviewed  CBC WITH DIFFERENTIAL/PLATELET - Abnormal; Notable for the following components:      Result Value   Hemoglobin 10.1 (*)    MCH 24.8 (*)    MCHC 27.6 (*)    All other components within normal limits  COMPREHENSIVE METABOLIC PANEL - Abnormal; Notable for the following components:   Glucose, Bld 100 (*)    Creatinine, Ser 1.01 (*)    Albumin 3.3 (*)    All other components within normal limits  I-STAT BETA HCG BLOOD, ED ([facility], [facility], [facility] ONLY) - Abnormal; Notable for the following components:   I-stat hCG, quantitative 6.7 (*)    All other components within normal limits  I-STAT CG4 LACTIC ACID, ED    EKG None  Radiology Dg Chest 2 View  Result Date: 07/10/2018 CLINICAL DATA:  Anemia.  Infected wound EXAM: CHEST - 2 VIEW COMPARISON:  June 11, 2018 FINDINGS: Central catheter tip is in the superior vena cava. No pneumothorax. Lungs are clear. Heart size and pulmonary vascularity are normal. No adenopathy. There is mild degenerative change in the thoracic spine.  IMPRESSION: Central catheter tip in superior vena cava. No pneumothorax. Lungs are clear. No evident adenopathy. Electronically Signed   By: Lowella Grip III M.D.   On: 07/10/2018 13:37    Procedures Procedures (including critical care time)  Medications Ordered in ED Medications  oxyCODONE (Oxy IR/ROXICODONE) immediate release tablet 10 mg (10 mg Oral Given 07/10/18 1714)     Initial Impression / Assessment and Plan / ED Course  I have reviewed the triage vital signs and the nursing notes.  Pertinent labs & imaging results that were available during my care of the patient were reviewed by me and considered in my medical decision making (see chart for details).    The patient does not need any treatment for any line infection at this point.  The IV team nurse also evaluated and felt that this was not infected but more reaction to the adherent that was used.  I agree with this assessment and the patient is advised to follow-up with her doctor.  Told to return here for any worsening in her condition. Final Clinical Impressions(s) / ED Diagnoses   Final diagnoses:  Complication associated with peripherally inserted central catheter, initial encounter  ED Discharge Orders    None       Dalia Heading, PA-C 07/11/18 0032    Little, Wenda Overland, MD 07/13/18 1024

## 2018-07-11 NOTE — Telephone Encounter (Signed)
FYI-----Pt called to give FYI to Dr. Otelia Sergeant that she is headed to the ED for possible IV port infection. Just wanted to let him know

## 2018-07-16 ENCOUNTER — Telehealth (INDEPENDENT_AMBULATORY_CARE_PROVIDER_SITE_OTHER): Payer: Self-pay | Admitting: Specialist

## 2018-07-16 ENCOUNTER — Telehealth (INDEPENDENT_AMBULATORY_CARE_PROVIDER_SITE_OTHER): Payer: Self-pay | Admitting: Surgery

## 2018-07-16 ENCOUNTER — Encounter (INDEPENDENT_AMBULATORY_CARE_PROVIDER_SITE_OTHER): Payer: Self-pay | Admitting: Surgery

## 2018-07-16 ENCOUNTER — Ambulatory Visit (INDEPENDENT_AMBULATORY_CARE_PROVIDER_SITE_OTHER): Payer: Medicare Other | Admitting: Surgery

## 2018-07-16 DIAGNOSIS — Z981 Arthrodesis status: Secondary | ICD-10-CM

## 2018-07-16 MED ORDER — METHOCARBAMOL 500 MG PO TABS: 500.0000 mg | ORAL_TABLET | Freq: Four times a day (QID) | ORAL | 0 refills | Status: DC | PRN

## 2018-07-16 MED ORDER — OXYCODONE HCL ER 10 MG PO T12A: 10.0000 mg | EXTENDED_RELEASE_TABLET | Freq: Two times a day (BID) | ORAL | 0 refills | Status: DC

## 2018-07-16 NOTE — Telephone Encounter (Signed)
Patient needs an appt w/Nitka next week for wound care. Patient stated has been coming every week.  [phone]

## 2018-07-16 NOTE — Progress Notes (Signed)
47 year old white female comes in today for lumbar wound check.  Scheduled to have wound VAC applied later this afternoon.  Needs refill of pain medication and muscle relaxer.  No complaints of fever chills.   Exam Wound has continued to improve.  She does have some areas of fat necrosis.  Neurologically intact.   Plan Patient will follow-up in the office with Dr. Otelia Sergeant in 1 week for wound check.  Wound nurse will reapply wound VAC later this afternoon.  Given prescription for OxyContin 10 mg every 12 hours as needed and Robaxin 500 mg.  There is some concern about her PICC line and did go to the emergency room for that.  Home health nurse will also evaluate PICC line today.  I advised patient to contact infectious disease office and see if she needs to come in for an earlier appointment than next week.

## 2018-07-16 NOTE — Telephone Encounter (Signed)
The prescription called in today for patient, Methocarbamol needs prior authorization.  Patients # [phone]

## 2018-07-17 ENCOUNTER — Telehealth (INDEPENDENT_AMBULATORY_CARE_PROVIDER_SITE_OTHER): Payer: Self-pay | Admitting: Specialist

## 2018-07-17 ENCOUNTER — Telehealth (INDEPENDENT_AMBULATORY_CARE_PROVIDER_SITE_OTHER): Payer: Self-pay

## 2018-07-17 ENCOUNTER — Ambulatory Visit (INDEPENDENT_AMBULATORY_CARE_PROVIDER_SITE_OTHER): Payer: Medicare Other | Admitting: Surgery

## 2018-07-17 NOTE — Telephone Encounter (Signed)
Marilyn [name], nurse with [facility] home health is wanting to make Dr. Otelia Sergeant aware that patient has tunneling in her wound at 6 oclock and its 4 cm depth. She is wanting to know if she should pack tunnel and if so what kind of dressing? ---Please advise

## 2018-07-17 NOTE — Telephone Encounter (Signed)
Harlin Rain called again stating she needs to know an answer by 5 p.m. today. Please advise  # [phone]

## 2018-07-17 NOTE — Telephone Encounter (Signed)
Talked with Harlin Rain and advised her of message per [name] concerning wound vac and tunnel for patient.

## 2018-07-17 NOTE — Telephone Encounter (Signed)
I have submitted on cover my meds.  It may take up to 72 hours to get a response.

## 2018-07-17 NOTE — Telephone Encounter (Signed)
Patient is wondering if there would be another prescription that wouldn't require prior auth?

## 2018-07-17 NOTE — Telephone Encounter (Signed)
Request Reference Number: [id number]. METHOCARBAM TAB 500MG  is approved through 09/17/2019. For further questions, call [phone].---I called and lmom advised patient this has been approved

## 2018-07-17 NOTE — Telephone Encounter (Signed)
I sched for 07/23/18 @ 915

## 2018-07-17 NOTE — Telephone Encounter (Signed)
I called patient and advised that I did submit for PA this morning since I was not here yesterday I could not do it then.  I did advise her that if she calls the pharm they can try to run the rx and they would know before I would know if it is approved. If its approved it will go through right then and they will fax me an approval/denial letter. She states that she will call the pharmacy back in a little while.  I did advise that the insurance comp does say to allow up to 72 hours for approval but that it doesn't take them that long.

## 2018-07-17 NOTE — Telephone Encounter (Signed)
For now just continue with the wound VAC.  Follow-up with Dr. Otelia Sergeant next Wednesday as scheduled and he will make decision at that time as to whether or not something is needed extra then.

## 2018-07-17 NOTE — Telephone Encounter (Signed)
Would you please call her and schedule?  Thank you!

## 2018-07-17 NOTE — Telephone Encounter (Signed)
Marilyn [name], nurse with [facility] home health is wanting to make Dr. Otelia Sergeant aware that patient has tunneling in her wound at 6 oclock and its 4 cm depth. She is wanting to know if she should pack tunnel and if so what kind of dressing? Her CB # [phone]

## 2018-07-18 ENCOUNTER — Encounter (HOSPITAL_COMMUNITY): Payer: Self-pay | Admitting: Emergency Medicine

## 2018-07-18 ENCOUNTER — Emergency Department (HOSPITAL_COMMUNITY): Payer: Medicare Other

## 2018-07-18 ENCOUNTER — Emergency Department (HOSPITAL_COMMUNITY)
Admission: EM | Admit: 2018-07-18 | Discharge: 2018-07-18 | Disposition: A | Discharge status: Home / Self Care | Payer: Medicare Other | Attending: Emergency Medicine | Admitting: Emergency Medicine

## 2018-07-18 ENCOUNTER — Other Ambulatory Visit: Payer: Self-pay

## 2018-07-18 DIAGNOSIS — Z96643 Presence of artificial hip joint, bilateral: Secondary | ICD-10-CM | POA: Insufficient documentation

## 2018-07-18 DIAGNOSIS — Z91040 Latex allergy status: Secondary | ICD-10-CM | POA: Diagnosis not present

## 2018-07-18 DIAGNOSIS — Z96653 Presence of artificial knee joint, bilateral: Secondary | ICD-10-CM | POA: Diagnosis not present

## 2018-07-18 DIAGNOSIS — E039 Hypothyroidism, unspecified: Secondary | ICD-10-CM | POA: Insufficient documentation

## 2018-07-18 DIAGNOSIS — Z87891 Personal history of nicotine dependence: Secondary | ICD-10-CM | POA: Diagnosis not present

## 2018-07-18 DIAGNOSIS — I10 Essential (primary) hypertension: Secondary | ICD-10-CM | POA: Insufficient documentation

## 2018-07-18 DIAGNOSIS — M545 Low back pain, unspecified: Secondary | ICD-10-CM

## 2018-07-18 DIAGNOSIS — J45909 Unspecified asthma, uncomplicated: Secondary | ICD-10-CM | POA: Insufficient documentation

## 2018-07-18 DIAGNOSIS — Z79899 Other long term (current) drug therapy: Secondary | ICD-10-CM | POA: Diagnosis not present

## 2018-07-18 DIAGNOSIS — G8929 Other chronic pain: Secondary | ICD-10-CM | POA: Diagnosis not present

## 2018-07-18 LAB — CBC WITH DIFFERENTIAL/PLATELET
Abs Immature Granulocytes: 0.04 10*3/uL (ref 0.00–0.07)
BASOS PCT: 0 %
Basophils Absolute: 0 10*3/uL (ref 0.0–0.1)
EOS ABS: 0.3 10*3/uL (ref 0.0–0.5)
EOS PCT: 4 %
HCT: 35.1 % — ABNORMAL LOW (ref 36.0–46.0)
Hemoglobin: 10.9 g/dL — ABNORMAL LOW (ref 12.0–15.0)
IMMATURE GRANULOCYTES: 1 %
Lymphocytes Relative: 32 %
Lymphs Abs: 1.9 10*3/uL (ref 0.7–4.0)
MCH: 24.7 pg — ABNORMAL LOW (ref 26.0–34.0)
MCHC: 31.1 g/dL (ref 30.0–36.0)
MCV: 79.4 fL — AB (ref 80.0–100.0)
MONOS PCT: 6 %
Monocytes Absolute: 0.4 10*3/uL (ref 0.1–1.0)
NEUTROS PCT: 57 %
Neutro Abs: 3.4 10*3/uL (ref 1.7–7.7)
PLATELETS: 363 10*3/uL (ref 150–400)
RBC: 4.42 MIL/uL (ref 3.87–5.11)
RDW: 14.5 % (ref 11.5–15.5)
WBC: 6 10*3/uL (ref 4.0–10.5)
nRBC: 0 % (ref 0.0–0.2)

## 2018-07-18 LAB — BASIC METABOLIC PANEL
Anion gap: 12 (ref 5–15)
BUN: 14 mg/dL (ref 6–20)
CO2: 22 mmol/L (ref 22–32)
Calcium: 10.4 mg/dL — ABNORMAL HIGH (ref 8.9–10.3)
Chloride: 104 mmol/L (ref 98–111)
Creatinine, Ser: 1.21 mg/dL — ABNORMAL HIGH (ref 0.44–1.00)
GFR calc Af Amer: 60 mL/min — ABNORMAL LOW (ref >60)
GFR, EST NON AFRICAN AMERICAN: 52 mL/min — AB (ref >60)
GLUCOSE: 100 mg/dL — AB (ref 70–99)
Potassium: 4.1 mmol/L (ref 3.5–5.1)
Sodium: 138 mmol/L (ref 135–145)

## 2018-07-18 MED ORDER — ONDANSETRON 4 MG PO TBDP
4.0000 mg | ORAL_TABLET | Freq: Once | ORAL | Status: AC
  Administered 2018-07-18: 4 mg via ORAL
  Filled 2018-07-18: qty 1

## 2018-07-18 MED ORDER — KETOROLAC TROMETHAMINE 30 MG/ML IJ SOLN
30.0000 mg | Freq: Once | INTRAMUSCULAR | Status: AC
  Administered 2018-07-18: 30 mg via INTRAVENOUS
  Filled 2018-07-18: qty 1

## 2018-07-18 MED ORDER — HYDROMORPHONE HCL 1 MG/ML IJ SOLN
1.0000 mg | Freq: Once | INTRAMUSCULAR | Status: AC
  Administered 2018-07-18: 1 mg via INTRAVENOUS
  Filled 2018-07-18: qty 1

## 2018-07-18 MED ORDER — MORPHINE SULFATE (PF) 4 MG/ML IV SOLN
4.0000 mg | Freq: Once | INTRAVENOUS | Status: AC
  Administered 2018-07-18: 4 mg via INTRAMUSCULAR
  Filled 2018-07-18: qty 1

## 2018-07-18 NOTE — Discharge Instructions (Addendum)
Please return for any problem.  Follow-up with Dr. Otelia Sergeant on Monday as instructed.  Take previously prescribed pain medication as instructed.  If you experience increased pain, fever,weakness or other concerning symptoms please return to the emergency department for evaluation.

## 2018-07-18 NOTE — ED Notes (Signed)
Pt's daughter came up and said the morphine is not working and wanted to know if she could get something else for pain.

## 2018-07-18 NOTE — ED Notes (Signed)
Patient verbalizes understanding of discharge instructions. Opportunity for questioning and answers were provided. Armband removed by staff, pt discharged from ED.  

## 2018-07-18 NOTE — ED Provider Notes (Signed)
[facility] EMERGENCY DEPARTMENT Provider Note   CSN: [id number] Arrival date & time: 07/18/18  1339     History   Chief Complaint Chief Complaint  Patient presents with  . Leg Pain    HPI NICKIE WARWICK is a 47 y.o. female.  47 year old female with prior medical history as detailed below presents for evaluation of continued back pain.  Patient with long-standing history of same.  She reports recent L4-L5 S1 lumbar fusion with Dr. Louanne Skye.  This was complicated by postoperative wound infection.  She currently has a wound VAC in place.  She is receiving antibiotics through a PICC line in her left arm.  She was seen earlier this week for continued pain and discomfort.  She was prescribed OxyContin and Robaxin.  She has not yet filled the OxyContin.  She reports that the Robaxin is not controlling her pain.  She denies fever, nausea, vomiting, abdominal pain, weakness to her lower extremities, difficulty with urination, or other complaint.   OxyContin prescription was apparently not in stock at [facility].  The patient was unable to get it filled.  The history is provided by the patient and medical records.  Illness  This is a chronic problem. The current episode started more than 1 week ago. The problem occurs constantly. The problem has not changed since onset.Pertinent negatives include no chest pain, no abdominal pain, no headaches and no shortness of breath. Nothing aggravates the symptoms. Nothing relieves the symptoms. She has tried nothing for the symptoms.    Past Medical History:  Diagnosis Date  . Anemia    taking iron now. pt states having no current issues 09/02/2015  . Anginal pain ([place])    pt states experiences chest wall pain pt states related to her asthma   . Anxiety    with MRI's  . Arthritis    Everywhere  . Asthma   . Dizziness   . GERD (gastroesophageal reflux disease)   . Headache(784.0)    HX OF MIGRAINES  . History of bronchitis   .  Hypertension   . Hypothyroidism    takes levothyroxen  . OSA on CPAP   . Shortness of breath    with exertion  . Wears glasses     Patient Active Problem List   Diagnosis Date Noted  . Anemia due to blood loss, acute 06/18/2018    Class: Acute  . Plantar fasciitis of left foot 06/16/2018    Class: Chronic  . Tendonitis, Achilles, right 06/16/2018    Class: Chronic  . Osteochondral talar dome lesion 06/16/2018    Class: Chronic  . Deep incisional surgical site infection   . Infected incision 06/12/2018  . Superficial incisional surgical site infection 06/11/2018    Class: Acute  . Right ankle effusion 06/11/2018    Class: Acute  . Morbid (severe) obesity due to excess calories ([place]) 05/29/2018    Class: Chronic  . Spondylolisthesis, lumbar region 05/27/2018    Class: Chronic  . Spinal stenosis of lumbar region 05/27/2018    Class: Chronic  . Urinary tract infection 05/27/2018    Class: Acute  . Fusion of spine of lumbar region 05/27/2018  . Pain of right hip joint 07/03/2017  . Acute right-sided low back pain with right-sided sciatica 03/27/2017  . Chronic right shoulder pain 02/13/2017  . History of rotator cuff tear 11/30/2016  . Impingement syndrome of right shoulder 11/30/2016  . Exacerbation of asthma 07/18/2016  . Hypokalemia 07/18/2016  . Hypertension  07/18/2016  . Depression with anxiety 07/18/2016  . Hypothyroidism 07/18/2016  . Hyperglycemia 07/18/2016  . Acute asthma exacerbation 07/18/2016  . Surgical wound dehiscence left hip; questionable superficial infection 11/10/2015  . Left hip postoperative wound infection 11/10/2015  . Osteoarthritis of left hip 09/09/2015  . 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 Obesity (BMI 35.0-39.9 without comorbidity) 04/28/2013    Past Surgical History:  Procedure Laterality Date  . CESAREAN SECTION     times 2  . CHOLECYSTECTOMY    . ENDOMETRIAL ABLATION    . INCISION AND DRAINAGE HIP Left  11/10/2015   Procedure: IRRIGATION AND DEBRIDEMENT LEFT HIP INCISION;  Surgeon: Mcarthur Rossetti, MD;  Location: [facility];  Service: Orthopedics;  Laterality: Left;  . JOINT REPLACEMENT  2011   total left knee  . KNEE ARTHROPLASTY  04/23/2012   right   . KNEE ARTHROSCOPY    . LUMBAR WOUND DEBRIDEMENT N/A 06/11/2018   Procedure: LUMBAR WOUND DEBRIDEMENT;  Surgeon: Jessy Oto, MD;  Location: [facility];  Service: Orthopedics;  Laterality: N/A;  . ROTATOR CUFF REPAIR     left   . SHOULDER SURGERY     right to repair ligament tear  . TOTAL HIP ARTHROPLASTY Left 09/09/2015   Procedure: LEFT TOTAL HIP ARTHROPLASTY ANTERIOR APPROACH;  Surgeon: Mcarthur Rossetti, MD;  Location: [facility];  Service: Orthopedics;  Laterality: Left;  . TOTAL KNEE ARTHROPLASTY  04/23/2012   Procedure: TOTAL KNEE ARTHROPLASTY;  Surgeon: Newt Minion, MD;  Location: [facility];  Service: Orthopedics;  Laterality: Right;  Right Total Knee Arthroplasty  . TUBAL LIGATION  1996     OB History    Gravida  2   Para  2   Term  2   Preterm      AB      Living  2     SAB      TAB      Ectopic      Multiple      Live Births               Home Medications    Prior to Admission medications   Medication Sig Start Date End Date Taking? Authorizing Provider  albuterol (PROVENTIL HFA;VENTOLIN HFA) 108 (90 BASE) MCG/ACT inhaler Inhale 2 puffs into the lungs every 6 (six) hours as needed for wheezing or shortness of breath.   Yes [provider]  albuterol (PROVENTIL) (2.5 MG/3ML) 0.083% nebulizer solution Take 3 mLs (2.5 mg total) by nebulization 3 (three) times daily. 07/19/16  Yes Thurnell Lose, MD  allopurinol (ZYLOPRIM) 100 MG tablet Take 1 tablet (100 mg total) by mouth 2 (two) times daily. 06/19/18  Yes Jessy Oto, MD  cetirizine (ZYRTEC) 10 MG tablet Take 10 mg by mouth daily as needed for allergies.    Yes [provider]  cyclobenzaprine (FLEXERIL) 10 MG tablet TAKE 1 TABLET BY  MOUTH THREE TIMES DAILY AS NEEDED FOR MUSCLE SPASM Patient taking differently: Take 10 mg by mouth 3 (three) times daily as needed for muscle spasms.  05/02/17  Yes Mcarthur Rossetti, MD  diclofenac sodium (VOLTAREN) 1 % GEL Apply 4 g topically 4 (four) times daily. 06/19/18  Yes Jessy Oto, MD  diphenhydrAMINE (BENADRYL) 25 MG tablet Take 1 tablet (25 mg total) by mouth every 6 (six) hours. Patient taking differently: Take 25 mg by mouth every 6 (six) hours as needed for itching or allergies.  09/29/15  Yes Jola Schmidt, MD  docusate sodium (COLACE) 100 MG capsule Take 1 capsule (100 mg total) by mouth 2 (two) times daily. 05/30/18  Yes Jessy Oto, MD  ergocalciferol (VITAMIN D2) 50000 units capsule Take 50,000 Units by mouth every Monday.    Yes [provider]  ferrous gluconate (FERGON) 324 MG tablet Take 1 tablet (324 mg total) by mouth 2 (two) times daily with a meal. 05/30/18  Yes Jessy Oto, MD  furosemide (LASIX) 20 MG tablet Take 20 mg by mouth daily as needed for fluid.  11/01/17  Yes [provider]  hydrochlorothiazide 25 MG tablet Take 25 mg by mouth daily.    Yes [provider]  levothyroxine (SYNTHROID, LEVOTHROID) 137 MCG tablet Take 137 mcg by mouth daily before breakfast.  05/31/17  Yes [provider]  methocarbamol (ROBAXIN) 500 MG tablet Take 1 tablet (500 mg total) by mouth every 6 (six) hours as needed for muscle spasms. 07/16/18  Yes Lanae Crumbly, PA-C  metoCLOPramide (REGLAN) 5 MG tablet Take 5 mg by mouth 3 (three) times daily before meals.   Yes [provider]  oxybutynin (DITROPAN-XL) 10 MG 24 hr tablet Take 10 mg by mouth daily.  05/31/17  Yes [provider]  oxyCODONE (OXYCONTIN) 10 mg 12 hr tablet Take 1 tablet (10 mg total) by mouth every 12 (twelve) hours. 07/16/18  Yes Lanae Crumbly, PA-C  potassium chloride (K-DUR,KLOR-CON) 10 MEQ tablet Take 10 mEq by mouth 2 (two) times daily.    Yes [provider]  ceFEPime (MAXIPIME) IVPB Inject 2 g into the vein every 12 (twelve) hours. Indication:   Deep incisional surgical site infection Last Day of Therapy:  07/24/18 Labs - Once weekly:  CBC/D and BMP, Labs - Every other week:  ESR and CRP Patient not taking: Reported on 07/10/2018 06/19/18 07/27/18  Jessy Oto, MD  ceFEPIme 2 g in sodium chloride 0.9 % 100 mL Inject 2 g into the vein every 12 (twelve) hours. Patient not taking: Reported on 07/10/2018 06/19/18   Jessy Oto, MD  [facility] 4 MG/0.1ML LIQD nasal spray kit Place 0.4 mg into the nose once.  06/28/17   [provider]  nutrition supplement, JUVEN, (JUVEN) PACK Take 1 packet by mouth 2 (two) times daily between meals. Patient not taking: Reported on 07/10/2018 06/19/18   Jessy Oto, MD  ondansetron (ZOFRAN ODT) 8 MG disintegrating tablet Take 1 tablet (8 mg total) by mouth every 8 (eight) hours as needed for nausea or vomiting. Patient not taking: Reported on 07/18/2018 03/03/18   Dorie Rank, MD  oxyCODONE (OXYCONTIN) 10 mg 12 hr tablet Take 1 tablet (10 mg total) by mouth every 12 (twelve) hours. Patient not taking: Reported on 07/10/2018 06/19/18   Jessy Oto, MD  oxyCODONE-acetaminophen (PERCOCET) 10-325 MG tablet Take 1-2 tablets by mouth every 6 (six) hours as needed for pain. MAXIMUM TOTAL ACETAMINOPHEN DOSE IS 4000 MG PER DAY Patient not taking: Reported on 07/18/2018 07/09/18   Jessy Oto, MD  pantoprazole (PROTONIX) 20 MG tablet Take 1 tablet (20 mg total) by mouth 2 (two) times daily before a meal. Patient not taking: Reported on 07/18/2018 03/13/18   Virgel Manifold, MD    Family History Family History  Problem Relation Age of Onset  . Cancer Mother        colon  . Epilepsy Mother   . Cancer Father        prostate  .  Diabetes Father   . Hypertension Father   . Hypertension Maternal Aunt   . Diabetes Maternal Aunt   . Hypertension Paternal Aunt     Social History Social History    Tobacco Use  . Smoking status: Former Smoker    Packs/day: 0.50    Years: 4.00    Pack years: 2.00    Last attempt to quit: 09/18/1991    Years since quitting: 26.8  . Smokeless tobacco: Never Used  Substance Use Topics  . Alcohol use: No  . Drug use: No     Allergies   Lisinopril; Bee venom; Propoxyphene; Codeine; Latex; Meloxicam; Morphine and related; and Tomato   Review of Systems Review of Systems  Respiratory: Negative for shortness of breath.   Cardiovascular: Negative for chest pain.  Gastrointestinal: Negative for abdominal pain.  Neurological: Negative for headaches.  All other systems reviewed and are negative.    Physical Exam Updated Vital Signs BP 134/89   Pulse (!) 102   Temp 99 F (37.2 C) (Oral)   Resp 16   SpO2 100%   Physical Exam  Constitutional: She is oriented to person, place, and time. She appears well-developed and well-nourished. No distress.  HENT:  Head: Normocephalic and atraumatic.  Mouth/Throat: Oropharynx is clear and moist.  Eyes: Pupils are equal, round, and reactive to light. Conjunctivae and EOM are normal.  Neck: Normal range of motion. Neck supple.  Cardiovascular: Normal rate, regular rhythm and normal heart sounds.  Pulmonary/Chest: Effort normal and breath sounds normal. No respiratory distress.  Abdominal: Soft. She exhibits no distension. There is no tenderness.  Musculoskeletal: Normal range of motion. She exhibits no edema or deformity.  Wound VAC in place over wound to the midline lumbar spine.  Neurological: She is alert and oriented to person, place, and time.  Both lower extremities are neurovascular intact.  Skin: Skin is warm and dry.  Psychiatric: She has a normal mood and affect.  Nursing note and vitals reviewed.    ED Treatments / Results  Labs (all labs ordered are listed, but only abnormal results are displayed) Labs Reviewed  CBC WITH DIFFERENTIAL/PLATELET - Abnormal; Notable for the following  components:      Result Value   Hemoglobin 10.9 (*)    HCT 35.1 (*)    MCV 79.4 (*)    MCH 24.7 (*)    All other components within normal limits  BASIC METABOLIC PANEL - Abnormal; Notable for the following components:   Glucose, Bld 100 (*)    Creatinine, Ser 1.21 (*)    Calcium 10.4 (*)    GFR calc non Af Amer 52 (*)    GFR calc Af Amer 60 (*)    All other components within normal limits    EKG None  Radiology Dg Lumbar Spine Complete  Result Date: 07/18/2018 CLINICAL DATA:  47 y/o F; laminectomy complicated by lumbar infection requiring debridement and wound VAC. EXAM: LUMBAR SPINE - COMPLETE 4+ VIEW COMPARISON:  06/30/2018 lumbar spine radiographs. FINDINGS: L4-S1 posterior instrumented and interbody fusion. No periprosthetic lucency or fracture identified. Vertebral body and disc spaces are maintained. Partially visualized left hip prosthesis. IMPRESSION: L4-S1 PLIF.  No acute osseous abnormality identified. Electronically Signed   By: Kristine Garbe M.D.   On: 07/18/2018 15:58    Procedures Procedures (including critical care time)  Medications Ordered in ED Medications  morphine 4 MG/ML injection 4 mg (4 mg Intramuscular Given 07/18/18 1451)  ondansetron (ZOFRAN-ODT) disintegrating tablet 4 mg (4  mg Oral Given 07/18/18 1451)  HYDROmorphone (DILAUDID) injection 1 mg (1 mg Intravenous Given 07/18/18 1733)  HYDROmorphone (DILAUDID) injection 1 mg (1 mg Intravenous Given 07/18/18 1926)  ketorolac (TORADOL) 30 MG/ML injection 30 mg (30 mg Intravenous Given 07/18/18 2043)     Initial Impression / Assessment and Plan / ED Course  I have reviewed the triage vital signs and the nursing notes.  Pertinent labs & imaging results that were available during my care of the patient were reviewed by me and considered in my medical decision making (see chart for details).       MDM  Screen complete   Patient is presenting with acute on chronic low back pain in the setting  of recent lumbar fusion with postoperative wound infection.  No objective findings of significant worsening infection found on exam or work-up today.  Case discussed with Dr. Sharol Given of [facility].  The patient is well-known to him.  He recommends that adequate pain control be obtained in the ED.  He feels that the patient would be appropriate for discharge if she can be made comfortable.  He does not feel the need for further imaging and/or admission for further work-up.  The patient appears to be significantly improved following her ED work-up.  She now desires discharge home.  She has confirmed that her prescription for OxyContin will be available for pickup tomorrow morning.  She is agreeable with the plan to discharge and will pick up her outpatient narcotic prescription tomorrow morning for use.  Strict return precautions given and understood.  Importance of close follow-up is repeatedly stressed.  Final Clinical Impressions(s) / ED Diagnoses   Final diagnoses:  Chronic low back pain, unspecified back pain laterality, unspecified whether sciatica present    ED Discharge Orders    None       Valarie Merino, MD 07/18/18 2152

## 2018-07-18 NOTE — ED Notes (Signed)
Pt continues to cry and daughter continues to be upset. Ice was given to help but makes pain worse. Pt brought back to [facility] to try to get blood work. Charge notified.

## 2018-07-18 NOTE — Telephone Encounter (Signed)
I called and advised Marilyn [name] on Fayrene Fearing' response

## 2018-07-18 NOTE — ED Provider Notes (Signed)
Patient placed in Quick Look pathway, seen and evaluated   Chief Complaint: Back pain   HPI:   Patient with a history of obesity with lumbar spondylolisthesis and severe bilateral stenosis L4-5 and L5-S1 who underwent lumbar spine fusion 05/27/18 which was complicated by post op superficial incisional surgical site infection which underwent debridement 06/11/2018 and has a wound VAC presents with 2 days of lower back pain radiating to the right hip and down the right leg.  She was seen at orthopedics yesterday and prescribed OxyContin every 12 hours as well as Robaxin.  Patient reports that this is not helping and she continues to be in severe pain.  She denies fevers.  No recent trauma.  ROS: Denies fevers.  Physical Exam:   Gen: No distress  Neuro: Awake and Alert  Skin: Warm    Focused Exam: Crying out in pain. Wound vac in place.   Initiation of care has begun. The patient has been counseled on the process, plan, and necessity for staying for the completion/evaluation, and the remainder of the medical screening examination    Lawrence Marseilles 07/18/18 1454    Tilden Fossa, MD 07/20/18 [phone]

## 2018-07-18 NOTE — ED Notes (Signed)
Pt crying in pain at this time, family ambulatory to nurses' desk requesting pain medicine. States she was given something a few hours ago but received no relief. Will notify EDP and continue to monitor.

## 2018-07-18 NOTE — ED Triage Notes (Signed)
Pt presents with right hip pain radiating down the leg x2 days. Pt is very tearful and can barely ambulate. Denies Fever. PICC line in place. Wound vac still in place from laminectomy on 9/10. She was seen by Ortho yesterday and given Robaxin but no relief.

## 2018-07-22 ENCOUNTER — Encounter: Payer: Self-pay | Admitting: Infectious Diseases

## 2018-07-22 ENCOUNTER — Ambulatory Visit (INDEPENDENT_AMBULATORY_CARE_PROVIDER_SITE_OTHER): Payer: Medicare Other | Admitting: Infectious Diseases

## 2018-07-22 DIAGNOSIS — T8141XA Infection following a procedure, superficial incisional surgical site, initial encounter: Secondary | ICD-10-CM

## 2018-07-22 MED ORDER — LEVOFLOXACIN 750 MG PO TABS: 750.0000 mg | ORAL_TABLET | Freq: Every day | ORAL | 2 refills | Status: DC

## 2018-07-22 NOTE — Assessment & Plan Note (Signed)
She has done well on her IV anbx Pharmacy here will get her labs Have written d/c PIC and anbx note on rx pad, for 07-24-18 Will start her on levaquin til she has f/u visit and wound is closed

## 2018-07-22 NOTE — Progress Notes (Signed)
   Subjective:    Patient ID: Marilyn [name], female    DOB: [birth year], 47 y.o.   MRN: [id number]  HPI 47 y.o. f with hx of L4-L5 and L5 S1 TLIF lumbar fusion on 05/27/18, who returned for f/u and was found to have wound d/c. She was admitted and had lumbar wound debridement 06-11-18. Her cx grew P aeruginosa (pan-sens).  She was treated with cefepime in hospital and then d/c on same on 06-19-18 with VAC in place.  She has been seen in ED on 10-24 and 11-1 for back pain. There have been some issues with narcotic mgmt as outpt (pharmacy was out). Today she continues to have pain. She continues on IV anbx, thought PIC was infected last week. She was seen by IV nurse and felt that line was ok, just out slightly.  Not able to get blood out last 3 weeks at home. Has been getting blood done at PCP office.  Per home RN wound has been shrinking but has had some tunneling.  Has 2.5 days of anbx left.    Review of Systems  Constitutional: Positive for appetite change. Negative for chills, fever and unexpected weight change.  Respiratory: Negative for cough and shortness of breath.   Gastrointestinal: Negative for constipation and diarrhea.  Genitourinary: Negative for difficulty urinating.  Musculoskeletal: Positive for back pain.  Please see HPI. All other systems reviewed and negative.      Objective:   Physical Exam  Constitutional: She is oriented to person, place, and time. She appears well-developed and well-nourished.  HENT:  Mouth/Throat: No oropharyngeal exudate.  Eyes: Pupils are equal, round, and reactive to light. EOM are normal.  Neck: Normal range of motion. Neck supple.  Cardiovascular: Normal rate, regular rhythm and normal heart sounds.  Pulmonary/Chest: Effort normal and breath sounds normal.  Abdominal: Soft. Bowel sounds are normal. She exhibits no distension. There is no tenderness.  Musculoskeletal: She exhibits no edema.       Back:       Arms: Lymphadenopathy:   She has no cervical adenopathy.  Neurological: She is alert and oriented to person, place, and time. No sensory deficit. She exhibits normal muscle tone.          Assessment & Plan:

## 2018-07-23 ENCOUNTER — Ambulatory Visit (INDEPENDENT_AMBULATORY_CARE_PROVIDER_SITE_OTHER): Payer: Medicare Other | Admitting: Specialist

## 2018-07-23 ENCOUNTER — Encounter (INDEPENDENT_AMBULATORY_CARE_PROVIDER_SITE_OTHER): Payer: Self-pay | Admitting: Specialist

## 2018-07-23 VITALS — BP 154/76 | HR 90 | Temp 97.3°F | Ht 63.0 in | Wt 279.0 lb

## 2018-07-23 DIAGNOSIS — M5416 Radiculopathy, lumbar region: Secondary | ICD-10-CM

## 2018-07-23 DIAGNOSIS — Z981 Arthrodesis status: Secondary | ICD-10-CM

## 2018-07-23 MED ORDER — GABAPENTIN 300 MG PO CAPS: 300.0000 mg | ORAL_CAPSULE | Freq: Every day | ORAL | 3 refills | Status: DC

## 2018-07-23 MED ORDER — OXYCODONE-ACETAMINOPHEN 10-325 MG PO TABS: 1.0000 | ORAL_TABLET | Freq: Four times a day (QID) | ORAL | 0 refills | Status: DC | PRN

## 2018-07-23 NOTE — Patient Instructions (Addendum)
    Call if there is increasing drainage, fever greater than 101.5, severe head aches, and worsening nausea or light sensitivity. If shortness of breath, bloody cough or chest tightness or pain go to an emergency room. No lifting greater than 10 lbs. Avoid bending, stooping and twisting. Use brace when sitting and out of bed even to go to bathroom. Walk, slowly increasing distances up to one mile by 4-6 weeks post op. May shower and change dressing following bathing with shower.When bathing remove the brace shower and replace brace before getting out of the shower. Start daily dressing changes with normal saline wet to dry.  MRI of the lumbar spine ordered.

## 2018-07-23 NOTE — Progress Notes (Signed)
Post-Op Visit Note   Patient: Marilyn [name]           Date of Birth: October 12, 1970           MRN: [id number] Visit Date: 07/23/2018 PCP: Joette Catching, MD   Assessment & Plan: 7 1/2 weeks post L4 to S1 fusion with post op incision seroma and areas of necrosis. Week #6  IV antibiotics.Ceftriaxone. Starting oral antibiotics.   Chief Complaint:  Chief Complaint  Patient presents with  . Lower Back - Routine Post Op, Wound Check  47 year old female 7 1/2 weeks post L4 to S1 fusion for DDD and spondylolisthesis with worsening leg pain and weakness. Now almost 6 weeks post return to OR for I&D superficially. No fever or chills has been to the urgent care in the interval for a check of the PICCu line and also last Friday for complaints of right thigh pain L3 or L4 distribution. Exam, incision with mostly granulation, area of fat necrosis right side incision and this is debrided locally. Saline wet to dry dressing applied. Last lab shows normal WBC count and no left shift. Sed rate is 91 and CRP is elevated. Visit Diagnoses:  1. History of lumbar spinal fusion     Plan: Call if there is increasing drainage, fever greater than 101.5, severe head aches, and worsening nausea or light sensitivity. If shortness of breath, bloody cough or chest tightness or pain go to an emergency room. No lifting greater than 10 lbs. Avoid bending, stooping and twisting. Use brace when sitting and out of bed even to go to bathroom. Walk, slowly increasing distances up to one mile by 4-6 weeks post op. May shower and change dressing following bathing with shower.When bathing remove the brace shower and replace brace before getting out of the shower. Start daily dressing changes with normal saline wet to dry.   Follow-Up Instructions: No follow-ups on file.   Orders:  No orders of the defined types were placed in this encounter.  No orders of the defined types were placed in this  encounter.   Imaging: No results found.  PMFS History: Patient Active Problem List   Diagnosis Date Noted  . Anemia due to blood loss, acute 06/18/2018    Priority: High    Class: Acute  . Superficial incisional surgical site infection 06/11/2018    Priority: High    Class: Acute  . Right ankle effusion 06/11/2018    Priority: High    Class: Acute  . Morbid (severe) obesity due to excess calories (HCC) 05/29/2018    Priority: High    Class: Chronic  . Spondylolisthesis, lumbar region 05/27/2018    Priority: High    Class: Chronic  . Spinal stenosis of lumbar region 05/27/2018    Priority: High    Class: Chronic  . Urinary tract infection 05/27/2018    Priority: High    Class: Acute  . Plantar fasciitis of left foot 06/16/2018    Priority: Medium    Class: Chronic  . Tendonitis, Achilles, right 06/16/2018    Priority: Medium    Class: Chronic  . Osteochondral talar dome lesion 06/16/2018    Priority: Medium    Class: Chronic  . Deep incisional surgical site infection   . Fusion of spine of lumbar region 05/27/2018  . Pain of right hip joint 07/03/2017  . Acute right-sided low back pain with right-sided sciatica 03/27/2017  . Chronic right shoulder pain 02/13/2017  . History of rotator cuff  tear 11/30/2016  . Impingement syndrome of right shoulder 11/30/2016  . Exacerbation of asthma 07/18/2016  . Hypokalemia 07/18/2016  . Hypertension 07/18/2016  . Depression with anxiety 07/18/2016  . Hypothyroidism 07/18/2016  . Hyperglycemia 07/18/2016  . Acute asthma exacerbation 07/18/2016  . Surgical wound dehiscence left hip; questionable superficial infection 11/10/2015  . Left hip postoperative wound infection 11/10/2015  . Osteoarthritis of left hip 09/09/2015  . 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 Obesity (BMI 35.0-39.9 without comorbidity) 04/28/2013   Past Medical History:  Diagnosis Date  . Anemia    taking iron now. pt states having no  current issues 09/02/2015  . Anginal pain (HCC)    pt states experiences chest wall pain pt states related to her asthma   . Anxiety    with MRI's  . Arthritis    Everywhere  . Asthma   . Dizziness   . GERD (gastroesophageal reflux disease)   . Headache(784.0)    HX OF MIGRAINES  . History of bronchitis   . Hypertension   . Hypothyroidism    takes levothyroxen  . OSA on CPAP   . Shortness of breath    with exertion  . Wears glasses     Family History  Problem Relation Age of Onset  . Cancer Mother        colon  . Epilepsy Mother   . Cancer Father        prostate  . Diabetes Father   . Hypertension Father   . Hypertension Maternal Aunt   . Diabetes Maternal Aunt   . Hypertension Paternal Aunt     Past Surgical History:  Procedure Laterality Date  . CESAREAN SECTION     times 2  . CHOLECYSTECTOMY    . ENDOMETRIAL ABLATION    . INCISION AND DRAINAGE HIP Left 11/10/2015   Procedure: IRRIGATION AND DEBRIDEMENT LEFT HIP INCISION;  Surgeon: Kathryne Hitch, MD;  Location: [facility] OR;  Service: Orthopedics;  Laterality: Left;  . JOINT REPLACEMENT  2011   total left knee  . KNEE ARTHROPLASTY  04/23/2012   right   . KNEE ARTHROSCOPY    . LUMBAR WOUND DEBRIDEMENT N/A 06/11/2018   Procedure: LUMBAR WOUND DEBRIDEMENT;  Surgeon: Kerrin Champagne, MD;  Location: [facility] OR;  Service: Orthopedics;  Laterality: N/A;  . ROTATOR CUFF REPAIR     left   . SHOULDER SURGERY     right to repair ligament tear  . TOTAL HIP ARTHROPLASTY Left 09/09/2015   Procedure: LEFT TOTAL HIP ARTHROPLASTY ANTERIOR APPROACH;  Surgeon: Kathryne Hitch, MD;  Location: [facility] ORS;  Service: Orthopedics;  Laterality: Left;  . TOTAL KNEE ARTHROPLASTY  04/23/2012   Procedure: TOTAL KNEE ARTHROPLASTY;  Surgeon: [name], MD;  Location: [facility] OR;  Service: Orthopedics;  Laterality: Right;  Right Total Knee Arthroplasty  . TUBAL LIGATION  1996   Social History   Occupational History  . Not on file  Tobacco  Use  . Smoking status: Former Smoker    Packs/day: 0.50    Years: 4.00    Pack years: 2.00    Last attempt to quit: 09/18/1991    Years since quitting: 26.8  . Smokeless tobacco: Never Used  Substance and Sexual Activity  . Alcohol use: No  . Drug use: No  . Sexual activity: Yes    Birth control/protection: Surgical

## 2018-07-29 NOTE — Progress Notes (Signed)
47 year old black female returns after having lumbar wound I&D.  Patient still has wound VAC and PICC line.  Has been wearing her brace.  Exam Wound VAC intact.  Patient is neurovascular intact.  No focal motor deficits.  Plan Patient will come in to the office next week for wound VAC off and wound check with Dr. Otelia SergeantNitka.  Patient will need her appointment on a day that the wound care nurse will replace the wound VAC dressing.  Continue IV antibiotics.

## 2018-07-30 ENCOUNTER — Ambulatory Visit (INDEPENDENT_AMBULATORY_CARE_PROVIDER_SITE_OTHER): Payer: Medicare Other | Admitting: Specialist

## 2018-07-30 ENCOUNTER — Encounter (INDEPENDENT_AMBULATORY_CARE_PROVIDER_SITE_OTHER): Payer: Self-pay | Admitting: Specialist

## 2018-07-30 VITALS — BP 130/73 | HR 84 | Temp 97.4°F | Ht 63.0 in | Wt 279.0 lb

## 2018-07-30 DIAGNOSIS — T8141XA Infection following a procedure, superficial incisional surgical site, initial encounter: Secondary | ICD-10-CM

## 2018-07-30 DIAGNOSIS — Z981 Arthrodesis status: Secondary | ICD-10-CM

## 2018-07-30 MED ORDER — CYCLOBENZAPRINE HCL 10 MG PO TABS: 10.0000 mg | ORAL_TABLET | Freq: Three times a day (TID) | ORAL | 0 refills | Status: DC | PRN

## 2018-07-30 MED ORDER — OXYCODONE-ACETAMINOPHEN 10-325 MG PO TABS: 1.0000 | ORAL_TABLET | Freq: Four times a day (QID) | ORAL | 0 refills | Status: DC | PRN

## 2018-07-30 NOTE — Patient Instructions (Signed)
Plan: Avoid frequent bending and stooping  No lifting greater than 10 lbs. May use ice or moist heat for pain. Weight loss is of benefit. Dressing change daily, okay to shower prior to dressing change. Exercise is important to improve your indurance and does allow people to function better inspite of back pain. MRI of the lumbar spine is ordered please keep your appointment to assess the cause of persistent pain and for any sign of persistent infection.

## 2018-07-30 NOTE — Progress Notes (Signed)
Post-Op Visit Note   Patient: Marilyn [name]           Date of Birth: Jan 09, 1971           MRN: [id number] Visit Date: 07/30/2018 PCP: Joette CatchingNyland, Leonard, MD   Assessment & Plan: 64 days post TLIF L4 to S1 for spondylolisthesis and spinal stenosis.   Chief Complaint:  Chief Complaint  Patient presents with  . Lower Back - Pain  Complaints of right leg pain, anterolateral right thigh above the knee. Not in calf or foot. Cough or sneeze without pain. Bowel and bladder are normal. VAC discontinues and now on daily normal saline wet to dry.  Antibiotic is oral levoquin for current back  Open wound. Has lost 100 lbs since June or July.Taking percocet 2 tablets 3 times per day. Gabapentin increased to 3 times per day. Body mass index is 49.42 kg/m.  Wound is open about 10cc x 5 cm by 3cm granulating about 70% mostly right sided necrotic fat with green hue c/w pseudomonas a over growth.  Visit Diagnoses: No diagnosis found.  Plan: Avoid frequent bending and stooping  No lifting greater than 10 lbs. May use ice or moist heat for pain. Weight loss is of benefit. Dressing change daily, okay to shower prior to dressing change. Exercise is important to improve your indurance and does allow people to function better inspite of back pain. MRI of the lumbar spine is ordered please keep your appointment to assess the cause of persistent pain and for any sign of persistent infection.    Follow-Up Instructions: No follow-ups on file.   Orders:  No orders of the defined types were placed in this encounter.  No orders of the defined types were placed in this encounter.   Imaging: No results found.  PMFS History: Patient Active Problem List   Diagnosis Date Noted  . Anemia due to blood loss, acute 06/18/2018    Priority: High    Class: Acute  . Superficial incisional surgical site infection 06/11/2018    Priority: High    Class: Acute  . Right ankle effusion 06/11/2018    Priority:  High    Class: Acute  . Morbid (severe) obesity due to excess calories (HCC) 05/29/2018    Priority: High    Class: Chronic  . Spondylolisthesis, lumbar region 05/27/2018    Priority: High    Class: Chronic  . Spinal stenosis of lumbar region 05/27/2018    Priority: High    Class: Chronic  . Urinary tract infection 05/27/2018    Priority: High    Class: Acute  . Plantar fasciitis of left foot 06/16/2018    Priority: Medium    Class: Chronic  . Tendonitis, Achilles, right 06/16/2018    Priority: Medium    Class: Chronic  . Osteochondral talar dome lesion 06/16/2018    Priority: Medium    Class: Chronic  . Deep incisional surgical site infection   . Fusion of spine of lumbar region 05/27/2018  . Pain of right hip joint 07/03/2017  . Acute right-sided low back pain with right-sided sciatica 03/27/2017  . Chronic right shoulder pain 02/13/2017  . History of rotator cuff tear 11/30/2016  . Impingement syndrome of right shoulder 11/30/2016  . Exacerbation of asthma 07/18/2016  . Hypokalemia 07/18/2016  . Hypertension 07/18/2016  . Depression with anxiety 07/18/2016  . Hypothyroidism 07/18/2016  . Hyperglycemia 07/18/2016  . Acute asthma exacerbation 07/18/2016  . Surgical wound dehiscence left hip; questionable superficial infection  11/10/2015  . Left hip postoperative wound infection 11/10/2015  . Osteoarthritis of left hip 09/09/2015  . 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 Obesity (BMI 35.0-39.9 without comorbidity) 04/28/2013   Past Medical History:  Diagnosis Date  . Anemia    taking iron now. pt states having no current issues 09/02/2015  . Anginal pain (HCC)    pt states experiences chest wall pain pt states related to her asthma   . Anxiety    with MRI's  . Arthritis    Everywhere  . Asthma   . Dizziness   . GERD (gastroesophageal reflux disease)   . Headache(784.0)    HX OF MIGRAINES  . History of bronchitis   . Hypertension   .  Hypothyroidism    takes levothyroxen  . OSA on CPAP   . Shortness of breath    with exertion  . Wears glasses     Family History  Problem Relation Age of Onset  . Cancer Mother        colon  . Epilepsy Mother   . Cancer Father        prostate  . Diabetes Father   . Hypertension Father   . Hypertension Maternal Aunt   . Diabetes Maternal Aunt   . Hypertension Paternal Aunt     Past Surgical History:  Procedure Laterality Date  . CESAREAN SECTION     times 2  . CHOLECYSTECTOMY    . ENDOMETRIAL ABLATION    . INCISION AND DRAINAGE HIP Left 11/10/2015   Procedure: IRRIGATION AND DEBRIDEMENT LEFT HIP INCISION;  Surgeon: Kathryne Hitch, MD;  Location: [facility] OR;  Service: Orthopedics;  Laterality: Left;  . JOINT REPLACEMENT  2011   total left knee  . KNEE ARTHROPLASTY  04/23/2012   right   . KNEE ARTHROSCOPY    . LUMBAR WOUND DEBRIDEMENT N/A 06/11/2018   Procedure: LUMBAR WOUND DEBRIDEMENT;  Surgeon: Kerrin Champagne, MD;  Location: [facility] OR;  Service: Orthopedics;  Laterality: N/A;  . ROTATOR CUFF REPAIR     left   . SHOULDER SURGERY     right to repair ligament tear  . TOTAL HIP ARTHROPLASTY Left 09/09/2015   Procedure: LEFT TOTAL HIP ARTHROPLASTY ANTERIOR APPROACH;  Surgeon: Kathryne Hitch, MD;  Location: [facility] ORS;  Service: Orthopedics;  Laterality: Left;  . TOTAL KNEE ARTHROPLASTY  04/23/2012   Procedure: TOTAL KNEE ARTHROPLASTY;  Surgeon: [name], MD;  Location: [facility] OR;  Service: Orthopedics;  Laterality: Right;  Right Total Knee Arthroplasty  . TUBAL LIGATION  1996   Social History   Occupational History  . Not on file  Tobacco Use  . Smoking status: Former Smoker    Packs/day: 0.50    Years: 4.00    Pack years: 2.00    Last attempt to quit: 09/18/1991    Years since quitting: 26.8  . Smokeless tobacco: Never Used  Substance and Sexual Activity  . Alcohol use: No  . Drug use: No  . Sexual activity: Yes    Birth control/protection: Surgical

## 2018-08-04 ENCOUNTER — Telehealth (INDEPENDENT_AMBULATORY_CARE_PROVIDER_SITE_OTHER): Payer: Self-pay | Admitting: Specialist

## 2018-08-04 NOTE — Telephone Encounter (Signed)
Patient states Dr. Otelia SergeantNitka never prescribed a premed for her MRI and she also is out of her percocet. [facility] [place] CB # [phone]

## 2018-08-04 NOTE — Addendum Note (Signed)
Addended by: Penne LashSHUE WILLS, Otis DialsHRISTY N on: 08/04/2018 09:15 AM   Modules accepted: Orders

## 2018-08-05 ENCOUNTER — Telehealth (INDEPENDENT_AMBULATORY_CARE_PROVIDER_SITE_OTHER): Payer: Self-pay | Admitting: Specialist

## 2018-08-05 NOTE — Telephone Encounter (Signed)
Patient was calling back about her rx's,  I advised her that this has been sent to hi and we are waiting on him to approve

## 2018-08-05 NOTE — Telephone Encounter (Signed)
I tried to call and lmom for her to call me back.

## 2018-08-05 NOTE — Telephone Encounter (Signed)
Patient would like to talk to you this morning before you get too busy.  CB#[phone].  Thank you

## 2018-08-06 ENCOUNTER — Other Ambulatory Visit (INDEPENDENT_AMBULATORY_CARE_PROVIDER_SITE_OTHER): Payer: Self-pay | Admitting: Specialist

## 2018-08-06 MED ORDER — ALPRAZOLAM 1 MG PO TABS: ORAL_TABLET | ORAL | 0 refills | Status: DC

## 2018-08-06 NOTE — Telephone Encounter (Signed)
Sent request to Dr. Nitka 

## 2018-08-06 NOTE — Telephone Encounter (Signed)
Did Dr. Otelia SergeantNitka ever approve patients pain medication and pre med for MRI? CB  # [phone]

## 2018-08-07 ENCOUNTER — Telehealth (INDEPENDENT_AMBULATORY_CARE_PROVIDER_SITE_OTHER): Payer: Self-pay | Admitting: Radiology

## 2018-08-07 ENCOUNTER — Other Ambulatory Visit (INDEPENDENT_AMBULATORY_CARE_PROVIDER_SITE_OTHER): Payer: Self-pay | Admitting: Specialist

## 2018-08-07 ENCOUNTER — Other Ambulatory Visit (INDEPENDENT_AMBULATORY_CARE_PROVIDER_SITE_OTHER): Payer: Self-pay | Admitting: Radiology

## 2018-08-07 MED ORDER — OXYCODONE-ACETAMINOPHEN 10-325 MG PO TABS: 1.0000 | ORAL_TABLET | Freq: Three times a day (TID) | ORAL | 0 refills | Status: DC | PRN

## 2018-08-07 NOTE — Telephone Encounter (Signed)
Sent request to Dr. Nitka 

## 2018-08-07 NOTE — Telephone Encounter (Signed)
Needs refill on pain meds

## 2018-08-07 NOTE — Telephone Encounter (Signed)
I called and advised rx was electronically sent to her pharm 

## 2018-08-07 NOTE — Telephone Encounter (Signed)
Cyclobenzaprine refill request 

## 2018-08-07 NOTE — Telephone Encounter (Signed)
Rx for med sent to her listed pharmacy, one week supply of one every 8 hours, #21, she is now 2 months post op and needs to cut back and stop the use of narcotics.

## 2018-08-07 NOTE — Addendum Note (Signed)
Addended by: Penne LashSHUE WILLS, Neysa BonitoHRISTY N on: 08/07/2018 09:10 AM   Modules accepted: Orders

## 2018-08-07 NOTE — Telephone Encounter (Signed)
Needs refill on her pain meds

## 2018-08-08 ENCOUNTER — Telehealth (INDEPENDENT_AMBULATORY_CARE_PROVIDER_SITE_OTHER): Payer: Self-pay | Admitting: Specialist

## 2018-08-08 ENCOUNTER — Ambulatory Visit
Admission: RE | Admit: 2018-08-08 | Discharge: 2018-08-08 | Disposition: A | Discharge status: Home / Self Care | Payer: Medicare Other | Source: Ambulatory Visit | Attending: Specialist | Admitting: Specialist

## 2018-08-08 DIAGNOSIS — Z981 Arthrodesis status: Secondary | ICD-10-CM

## 2018-08-08 NOTE — Telephone Encounter (Signed)
Patient said she could not lay flat on her back so they were unable to do MRI - she was told she can try to do it at the hospital where they can put her to sleep to do the MRI. Please advise # [phone]

## 2018-08-08 NOTE — Telephone Encounter (Signed)
Patient aware of the below message  

## 2018-08-08 NOTE — Telephone Encounter (Signed)
Please advise 

## 2018-08-11 ENCOUNTER — Telehealth (INDEPENDENT_AMBULATORY_CARE_PROVIDER_SITE_OTHER): Payer: Self-pay | Admitting: Specialist

## 2018-08-11 NOTE — Telephone Encounter (Signed)
Patient was unable to complete the MRI at [facility] due to her not being able to lay flat on her back.  She was advised to let Dr. Otelia SergeantNitka know and he may be able to schedule it at the hospital so that they can put to sleep.  CB#[phone]

## 2018-08-11 NOTE — Telephone Encounter (Signed)
Can you please work on this

## 2018-08-12 ENCOUNTER — Other Ambulatory Visit: Payer: Medicare Other

## 2018-08-12 NOTE — Telephone Encounter (Signed)
I just need an ok from Dr. Otelia SergeantNitka to send pt for sedation. Can you please let Dr. Otelia SergeantNitka and make sure it is ok.

## 2018-08-12 NOTE — Telephone Encounter (Signed)
Per Dr. Otelia SergeantNitka ok to sched with [facility] at [facility]patient has appt here tomorrow with Dr. Otelia SergeantNitka

## 2018-08-13 ENCOUNTER — Encounter (INDEPENDENT_AMBULATORY_CARE_PROVIDER_SITE_OTHER): Payer: Self-pay | Admitting: Specialist

## 2018-08-13 ENCOUNTER — Ambulatory Visit (INDEPENDENT_AMBULATORY_CARE_PROVIDER_SITE_OTHER): Payer: Medicare Other | Admitting: Specialist

## 2018-08-13 VITALS — BP 117/70 | HR 76 | Ht 63.0 in | Wt 274.0 lb

## 2018-08-13 DIAGNOSIS — M4326 Fusion of spine, lumbar region: Secondary | ICD-10-CM

## 2018-08-13 DIAGNOSIS — T8141XA Infection following a procedure, superficial incisional surgical site, initial encounter: Secondary | ICD-10-CM

## 2018-08-13 MED ORDER — OXYCODONE-ACETAMINOPHEN 7.5-325 MG PO TABS: 1.0000 | ORAL_TABLET | Freq: Three times a day (TID) | ORAL | 0 refills | Status: DC | PRN

## 2018-08-13 NOTE — Telephone Encounter (Signed)
[facility] They will contact pt to schedule appt

## 2018-08-13 NOTE — Telephone Encounter (Signed)
[facility], they will contact pt to schedule appt

## 2018-08-13 NOTE — Progress Notes (Signed)
Post-Op Visit Note   Patient: Marilyn [name]           Date of Birth: December 17, 1970           MRN: [id number] Visit Date: 08/13/2018 PCP: Joette Catching, MD   Assessment & Plan:78 days post Lumbar fusion  Chief Complaint:  Chief Complaint  Patient presents with  . Lower Back - Routine Post Op  PICC line has been discontinued. Saw Dr. Ninetta Lights recently and he has stopped IV antibiotics but is on levaquin. Sed rate is elevated and a MRI was ordered unfortunately she is extremely claustraphobic and an MRI will need to be done under a general anesthesia.  No bladder difficulty.  Bowel is functioning normally. Incision with granulation tissue on the left side that is excellent but right right side has 2 areas of fat necrosis and these are debrided today. Normal saline wet to dry and a layer of mupiricin cream applied.  Visit Diagnoses:  1. Superficial incisional surgical site infection   2. Fusion of spine of lumbar region     Plan: Avoid frequent bending and stooping  No lifting greater than 10 lbs. May use ice or moist heat for pain. Weight loss is of benefit.  Exercise is important to improve your indurance and does allow people to function better inspite of back pain.  Plan: Continue with daily normal saline wet to dry dressings. Take antibiotics by mouth as directed. MRI of the lumbar spine under a general anesthetic is ordered.   Lab work Sed rate and CRP.   Follow-Up Instructions: Return in about 1 week (around 08/20/2018).   Orders:  No orders of the defined types were placed in this encounter.  No orders of the defined types were placed in this encounter.   Imaging: No results found.  PMFS History: Patient Active Problem List   Diagnosis Date Noted  . Anemia due to blood loss, acute 06/18/2018    Priority: High    Class: Acute  . Superficial incisional surgical site infection 06/11/2018    Priority: High    Class: Acute  . Right ankle effusion  06/11/2018    Priority: High    Class: Acute  . Morbid (severe) obesity due to excess calories (HCC) 05/29/2018    Priority: High    Class: Chronic  . Spondylolisthesis, lumbar region 05/27/2018    Priority: High    Class: Chronic  . Spinal stenosis of lumbar region 05/27/2018    Priority: High    Class: Chronic  . Urinary tract infection 05/27/2018    Priority: High    Class: Acute  . Plantar fasciitis of left foot 06/16/2018    Priority: Medium    Class: Chronic  . Tendonitis, Achilles, right 06/16/2018    Priority: Medium    Class: Chronic  . Osteochondral talar dome lesion 06/16/2018    Priority: Medium    Class: Chronic  . Deep incisional surgical site infection   . Fusion of spine of lumbar region 05/27/2018  . Pain of right hip joint 07/03/2017  . Acute right-sided low back pain with right-sided sciatica 03/27/2017  . Chronic right shoulder pain 02/13/2017  . History of rotator cuff tear 11/30/2016  . Impingement syndrome of right shoulder 11/30/2016  . Exacerbation of asthma 07/18/2016  . Hypokalemia 07/18/2016  . Hypertension 07/18/2016  . Depression with anxiety 07/18/2016  . Hypothyroidism 07/18/2016  . Hyperglycemia 07/18/2016  . Acute asthma exacerbation 07/18/2016  . Surgical wound dehiscence left hip; questionable  superficial infection 11/10/2015  . Left hip postoperative wound infection 11/10/2015  . Osteoarthritis of left hip 09/09/2015  . 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 Obesity (BMI 35.0-39.9 without comorbidity) 04/28/2013   Past Medical History:  Diagnosis Date  . Anemia    taking iron now. pt states having no current issues 09/02/2015  . Anginal pain (HCC)    pt states experiences chest wall pain pt states related to her asthma   . Anxiety    with MRI's  . Arthritis    Everywhere  . Asthma   . Dizziness   . GERD (gastroesophageal reflux disease)   . Headache(784.0)    HX OF MIGRAINES  . History of bronchitis   .  Hypertension   . Hypothyroidism    takes levothyroxen  . OSA on CPAP   . Shortness of breath    with exertion  . Wears glasses     Family History  Problem Relation Age of Onset  . Cancer Mother        colon  . Epilepsy Mother   . Cancer Father        prostate  . Diabetes Father   . Hypertension Father   . Hypertension Maternal Aunt   . Diabetes Maternal Aunt   . Hypertension Paternal Aunt     Past Surgical History:  Procedure Laterality Date  . CESAREAN SECTION     times 2  . CHOLECYSTECTOMY    . ENDOMETRIAL ABLATION    . INCISION AND DRAINAGE HIP Left 11/10/2015   Procedure: IRRIGATION AND DEBRIDEMENT LEFT HIP INCISION;  Surgeon: Kathryne Hitchhristopher Y Blackman, MD;  Location: [facility] OR;  Service: Orthopedics;  Laterality: Left;  . JOINT REPLACEMENT  2011   total left knee  . KNEE ARTHROPLASTY  04/23/2012   right   . KNEE ARTHROSCOPY    . LUMBAR WOUND DEBRIDEMENT N/A 06/11/2018   Procedure: LUMBAR WOUND DEBRIDEMENT;  Surgeon: Kerrin ChampagneNitka,  E, MD;  [facility];  Service: Orthopedics;  Laterality: N/A;  . ROTATOR CUFF REPAIR     left   . SHOULDER SURGERY     right to repair ligament tear  . TOTAL HIP ARTHROPLASTY Left 09/09/2015   Procedure: LEFT TOTAL HIP ARTHROPLASTY ANTERIOR APPROACH;  Surgeon: Kathryne Hitchhristopher Y Blackman, MD;  Location: [facility] ORS;  Service: Orthopedics;  Laterality: Left;  . TOTAL KNEE ARTHROPLASTY  04/23/2012   Procedure: TOTAL KNEE ARTHROPLASTY;  Surgeon: Nadara MustardMarcus V Duda, MD;  Location: [facility] OR;  Service: Orthopedics;  Laterality: Right;  Right Total Knee Arthroplasty  . TUBAL LIGATION  1996   Social History   Occupational History  . Not on file  Tobacco Use  . Smoking status: Former Smoker    Packs/day: 0.50    Years: 4.00    Pack years: 2.00    Last attempt to quit: 09/18/1991    Years since quitting: 26.9  . Smokeless tobacco: Never Used  Substance and Sexual Activity  . Alcohol use: No  . Drug use: No  . Sexual activity: Yes    Birth control/protection:  Surgical

## 2018-08-13 NOTE — Patient Instructions (Addendum)
Avoid frequent bending and stooping  No lifting greater than 10 lbs. May use ice or moist heat for pain. Weight loss is of benefit.  Exercise is important to improve your indurance and does allow people to function better inspite of back pain.  Plan: Continue with daily normal saline wet to dry dressings. Take antibiotics by mouth as directed. MRI of the lumbar spine under a general anesthetic is ordered.   Lab work Sed rate and CRP.

## 2018-08-19 LAB — C-REACTIVE PROTEIN: CRP: 20.9 mg/L — AB (ref <8.0)

## 2018-08-19 LAB — SEDIMENTATION RATE: Sed Rate: 67 mm/h — ABNORMAL HIGH (ref 0–20)

## 2018-08-20 ENCOUNTER — Other Ambulatory Visit (INDEPENDENT_AMBULATORY_CARE_PROVIDER_SITE_OTHER): Payer: Self-pay | Admitting: Specialist

## 2018-08-20 ENCOUNTER — Encounter (INDEPENDENT_AMBULATORY_CARE_PROVIDER_SITE_OTHER): Payer: Self-pay | Admitting: Specialist

## 2018-08-20 ENCOUNTER — Ambulatory Visit (INDEPENDENT_AMBULATORY_CARE_PROVIDER_SITE_OTHER): Payer: Medicare Other

## 2018-08-20 ENCOUNTER — Telehealth (INDEPENDENT_AMBULATORY_CARE_PROVIDER_SITE_OTHER): Payer: Self-pay | Admitting: Specialist

## 2018-08-20 ENCOUNTER — Ambulatory Visit (INDEPENDENT_AMBULATORY_CARE_PROVIDER_SITE_OTHER): Payer: Medicare Other | Admitting: Specialist

## 2018-08-20 VITALS — BP 135/74 | HR 82 | Ht 63.0 in | Wt 274.0 lb

## 2018-08-20 DIAGNOSIS — M4326 Fusion of spine, lumbar region: Secondary | ICD-10-CM | POA: Diagnosis not present

## 2018-08-20 DIAGNOSIS — Z981 Arthrodesis status: Secondary | ICD-10-CM

## 2018-08-20 MED ORDER — OXYCODONE-ACETAMINOPHEN 7.5-325 MG PO TABS: 1.0000 | ORAL_TABLET | Freq: Three times a day (TID) | ORAL | 0 refills | Status: DC | PRN

## 2018-08-20 NOTE — Telephone Encounter (Signed)
09/11/18 10:15am need 2 week f/u

## 2018-08-20 NOTE — Patient Instructions (Addendum)
Avoid frequent bending and stooping  No lifting greater than 10 lbs. May use ice or moist heat for pain. Weight loss is of benefit. Best medication for lumbar disc disease is arthritis medications like motrin, celebrex and naprosyn. Exercise is important to improve your indurance and does allow people to function better inspite of back pain.  Continue with the dressing changes and obtain MRI of the lumbar spine with and without enhancement to assess for right leg  Nerve compression.

## 2018-08-20 NOTE — Progress Notes (Signed)
Post-Op Visit Note   Patient: Marilyn [name]           Date of Birth: 1971/08/04           MRN: [id number] Avoid frequent bending and stooping  No lifting greater than 10 lbs. May use ice or moist heat for pain. Weight loss is of benefit. Best medication for lumbar disc disease is arthritis medications like motrin, celebrex and naprosyn. Exercise is important to improve your indurance and does allow people to function better inspite of back pain.  Visit Date: 08/20/2018 PCP: Joette Catching, MD   Assessment & Plan:2 3/4 months post L4-S1 fusion with TLIFs and instrumentation posteriorly. Post op incision infection with areas of fat necrosis post 6 weeks IV antibiotics. Sed rate is improved but CRP is elevated at 20.9.  Chief Complaint:  Chief Complaint  Patient presents with  . Lower Back - Follow-up   Visit Diagnoses:  1. Fusion of spine of lumbar region     Plan: Avoid frequent bending and stooping  No lifting greater than 10 lbs. May use ice or moist heat for pain. Weight loss is of benefit. Best medication for lumbar disc disease is arthritis medications like motrin, celebrex and naprosyn. Exercise is important to improve your indurance and does allow people to function better inspite of back pain. Continue with the dressing changes and obtain MRI of the lumbar spine with and without enhancement to assess for right leg  Nerve compression. Follow-Up Instructions: No follow-ups on file.   Orders:  Orders Placed This Encounter  Procedures  . XR Lumbar Spine 2-3 Views   No orders of the defined types were placed in this encounter.   Imaging: Xr Lumbar Spine 2-3 Views  Result Date: 08/20/2018 AP and lateral flexion and extension radiographs of the lumbar spine show the presence of pedicle screws and rods L4 to S1 with interbody cages at L4-5 and L5-S1 in good position and alignment. No lucency noted. The measurement of motion at similar points across the anterior  lumbar levels is less than 1 mm difference in flexion and extension suggesting that the fusion is healing.   PMFS History: Patient Active Problem List   Diagnosis Date Noted  . Anemia due to blood loss, acute 06/18/2018    Priority: High    Class: Acute  . Superficial incisional surgical site infection 06/11/2018    Priority: High    Class: Acute  . Right ankle effusion 06/11/2018    Priority: High    Class: Acute  . Morbid (severe) obesity due to excess calories (HCC) 05/29/2018    Priority: High    Class: Chronic  . Spondylolisthesis, lumbar region 05/27/2018    Priority: High    Class: Chronic  . Spinal stenosis of lumbar region 05/27/2018    Priority: High    Class: Chronic  . Urinary tract infection 05/27/2018    Priority: High    Class: Acute  . Plantar fasciitis of left foot 06/16/2018    Priority: Medium    Class: Chronic  . Tendonitis, Achilles, right 06/16/2018    Priority: Medium    Class: Chronic  . Osteochondral talar dome lesion 06/16/2018    Priority: Medium    Class: Chronic  . Deep incisional surgical site infection   . Fusion of spine of lumbar region 05/27/2018  . Pain of right hip joint 07/03/2017  . Acute right-sided low back pain with right-sided sciatica 03/27/2017  . Chronic right shoulder pain 02/13/2017  .  History of rotator cuff tear 11/30/2016  . Impingement syndrome of right shoulder 11/30/2016  . Exacerbation of asthma 07/18/2016  . Hypokalemia 07/18/2016  . Hypertension 07/18/2016  . Depression with anxiety 07/18/2016  . Hypothyroidism 07/18/2016  . Hyperglycemia 07/18/2016  . Acute asthma exacerbation 07/18/2016  . Surgical wound dehiscence left hip; questionable superficial infection 11/10/2015  . Left hip postoperative wound infection 11/10/2015  . Osteoarthritis of left hip 09/09/2015  . 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 Obesity (BMI 35.0-39.9 without comorbidity) 04/28/2013   Past Medical History:    Diagnosis Date  . Anemia    taking iron now. pt states having no current issues 09/02/2015  . Anginal pain (HCC)    pt states experiences chest wall pain pt states related to her asthma   . Anxiety    with MRI's  . Arthritis    Everywhere  . Asthma   . Dizziness   . GERD (gastroesophageal reflux disease)   . Headache(784.0)    HX OF MIGRAINES  . History of bronchitis   . Hypertension   . Hypothyroidism    takes levothyroxen  . OSA on CPAP   . Shortness of breath    with exertion  . Wears glasses     Family History  Problem Relation Age of Onset  . Cancer Mother        colon  . Epilepsy Mother   . Cancer Father        prostate  . Diabetes Father   . Hypertension Father   . Hypertension Maternal Aunt   . Diabetes Maternal Aunt   . Hypertension Paternal Aunt     Past Surgical History:  Procedure Laterality Date  . CESAREAN SECTION     times 2  . CHOLECYSTECTOMY    . ENDOMETRIAL ABLATION    . INCISION AND DRAINAGE HIP Left 11/10/2015   Procedure: IRRIGATION AND DEBRIDEMENT LEFT HIP INCISION;  Surgeon: Kathryne Hitchhristopher Y Blackman, MD;  Location: [facility];  Service: Orthopedics;  Laterality: Left;  . JOINT REPLACEMENT  2011   total left knee  . KNEE ARTHROPLASTY  04/23/2012   right   . KNEE ARTHROSCOPY    . LUMBAR WOUND DEBRIDEMENT N/A 06/11/2018   Procedure: LUMBAR WOUND DEBRIDEMENT;  Surgeon: Kerrin ChampagneNitka, Adrick Kestler E, MD;  [facility];  Service: Orthopedics;  Laterality: N/A;  . ROTATOR CUFF REPAIR     left   . SHOULDER SURGERY     right to repair ligament tear  . TOTAL HIP ARTHROPLASTY Left 09/09/2015   Procedure: LEFT TOTAL HIP ARTHROPLASTY ANTERIOR APPROACH;  Surgeon: Kathryne Hitchhristopher Y Blackman, MD;  Location: [facility];  Service: Orthopedics;  Laterality: Left;  . TOTAL KNEE ARTHROPLASTY  04/23/2012   Procedure: TOTAL KNEE ARTHROPLASTY;  Surgeon: Nadara MustardMarcus V Duda, MD;  Location: [facility];  Service: Orthopedics;  Laterality: Right;  Right Total Knee Arthroplasty  . TUBAL LIGATION  1996    Social History   Occupational History  . Not on file  Tobacco Use  . Smoking status: Former Smoker    Packs/day: 0.50    Years: 4.00    Pack years: 2.00    Last attempt to quit: 09/18/1991    Years since quitting: 26.9  . Smokeless tobacco: Never Used  Substance and Sexual Activity  . Alcohol use: No  . Drug use: No  . Sexual activity: Yes    Birth control/protection: Surgical

## 2018-08-25 ENCOUNTER — Telehealth (INDEPENDENT_AMBULATORY_CARE_PROVIDER_SITE_OTHER): Payer: Self-pay | Admitting: Specialist

## 2018-08-25 NOTE — Telephone Encounter (Signed)
Patient called advised she was suppose to have a 2 wk ROV with Dr Otelia SergeantNitka. Patient said she is scheduled to return 09/11/18.  Patient said she should have an earlier appointment. The number to contact  patient is [phone]

## 2018-08-25 NOTE — Telephone Encounter (Signed)
I called put her on Fayrene FearingJames' Schedule for 09/03/18 @ 9am, and will keep on 09/11/18 with Dr. Otelia SergeantNitka

## 2018-09-03 ENCOUNTER — Ambulatory Visit (INDEPENDENT_AMBULATORY_CARE_PROVIDER_SITE_OTHER): Payer: Medicare Other | Admitting: Surgery

## 2018-09-03 ENCOUNTER — Encounter (INDEPENDENT_AMBULATORY_CARE_PROVIDER_SITE_OTHER): Payer: Self-pay | Admitting: Surgery

## 2018-09-03 ENCOUNTER — Encounter (HOSPITAL_COMMUNITY): Payer: Self-pay | Admitting: *Deleted

## 2018-09-03 ENCOUNTER — Other Ambulatory Visit: Payer: Self-pay

## 2018-09-03 VITALS — BP 151/80 | HR 74 | Temp 97.7°F | Ht 63.0 in | Wt 274.0 lb

## 2018-09-03 DIAGNOSIS — Z981 Arthrodesis status: Secondary | ICD-10-CM

## 2018-09-03 DIAGNOSIS — T8141XA Infection following a procedure, superficial incisional surgical site, initial encounter: Secondary | ICD-10-CM

## 2018-09-03 MED ORDER — OXYCODONE-ACETAMINOPHEN 7.5-325 MG PO TABS: 1.0000 | ORAL_TABLET | Freq: Three times a day (TID) | ORAL | 0 refills | Status: DC | PRN

## 2018-09-03 NOTE — Progress Notes (Signed)
Pt denies SOB, chest pain, and being under the care of a cardiologist. Pt denies having a stress test, echo and cardiac cath. Pt made aware to stop taking vitamins and herbal medications. [facility] [place]. Pt verbalized understanding of all pre-op instructions.

## 2018-09-03 NOTE — Progress Notes (Signed)
Marilyn [name] returns for wound check.  States that she is doing well.  No drainage.  Continues p.o. antibiotics.    Exam Wound continues to heal well.  No drainage.  Plan Refilled Percocet 7.5/325.  Continue wet-to-dry dressing changes.  Follow-up with Dr. Weston BrassNick as scheduled next Thursday for wound check and review lumbar MRI

## 2018-09-09 ENCOUNTER — Ambulatory Visit (HOSPITAL_COMMUNITY)
Admission: RE | Admit: 2018-09-09 | Discharge: 2018-09-09 | Disposition: A | Discharge status: Home / Self Care | Payer: Medicare Other | Source: Ambulatory Visit | Attending: Specialist | Admitting: Specialist

## 2018-09-09 ENCOUNTER — Ambulatory Visit (HOSPITAL_COMMUNITY): Payer: Medicare Other | Admitting: Certified Registered Nurse Anesthetist

## 2018-09-09 ENCOUNTER — Encounter (HOSPITAL_COMMUNITY)
Admission: RE | Disposition: A | Discharge status: Home / Self Care | Payer: Self-pay | Source: Ambulatory Visit | Attending: Specialist

## 2018-09-09 ENCOUNTER — Encounter (HOSPITAL_COMMUNITY): Payer: Self-pay

## 2018-09-09 ENCOUNTER — Other Ambulatory Visit: Payer: Self-pay

## 2018-09-09 DIAGNOSIS — J45909 Unspecified asthma, uncomplicated: Secondary | ICD-10-CM | POA: Diagnosis not present

## 2018-09-09 DIAGNOSIS — Z885 Allergy status to narcotic agent status: Secondary | ICD-10-CM | POA: Diagnosis not present

## 2018-09-09 DIAGNOSIS — M48061 Spinal stenosis, lumbar region without neurogenic claudication: Secondary | ICD-10-CM | POA: Insufficient documentation

## 2018-09-09 DIAGNOSIS — M545 Low back pain: Secondary | ICD-10-CM | POA: Insufficient documentation

## 2018-09-09 DIAGNOSIS — M4807 Spinal stenosis, lumbosacral region: Secondary | ICD-10-CM | POA: Insufficient documentation

## 2018-09-09 DIAGNOSIS — G4733 Obstructive sleep apnea (adult) (pediatric): Secondary | ICD-10-CM | POA: Diagnosis not present

## 2018-09-09 DIAGNOSIS — M1990 Unspecified osteoarthritis, unspecified site: Secondary | ICD-10-CM | POA: Insufficient documentation

## 2018-09-09 DIAGNOSIS — Z87891 Personal history of nicotine dependence: Secondary | ICD-10-CM | POA: Insufficient documentation

## 2018-09-09 DIAGNOSIS — M5416 Radiculopathy, lumbar region: Secondary | ICD-10-CM | POA: Diagnosis present

## 2018-09-09 DIAGNOSIS — Z96651 Presence of right artificial knee joint: Secondary | ICD-10-CM | POA: Diagnosis not present

## 2018-09-09 DIAGNOSIS — I10 Essential (primary) hypertension: Secondary | ICD-10-CM | POA: Diagnosis not present

## 2018-09-09 DIAGNOSIS — E039 Hypothyroidism, unspecified: Secondary | ICD-10-CM | POA: Diagnosis not present

## 2018-09-09 DIAGNOSIS — F419 Anxiety disorder, unspecified: Secondary | ICD-10-CM | POA: Insufficient documentation

## 2018-09-09 DIAGNOSIS — Z981 Arthrodesis status: Secondary | ICD-10-CM | POA: Insufficient documentation

## 2018-09-09 DIAGNOSIS — Z79899 Other long term (current) drug therapy: Secondary | ICD-10-CM | POA: Diagnosis not present

## 2018-09-09 DIAGNOSIS — K219 Gastro-esophageal reflux disease without esophagitis: Secondary | ICD-10-CM | POA: Diagnosis not present

## 2018-09-09 HISTORY — PX: RADIOLOGY WITH ANESTHESIA: SHX6223

## 2018-09-09 LAB — BASIC METABOLIC PANEL
ANION GAP: 14 (ref 5–15)
BUN: 20 mg/dL (ref 6–20)
CO2: 23 mmol/L (ref 22–32)
Calcium: 9.4 mg/dL (ref 8.9–10.3)
Chloride: 105 mmol/L (ref 98–111)
Creatinine, Ser: 1.28 mg/dL — ABNORMAL HIGH (ref 0.44–1.00)
GFR calc Af Amer: 57 mL/min — ABNORMAL LOW (ref >60)
GFR calc non Af Amer: 49 mL/min — ABNORMAL LOW (ref >60)
Glucose, Bld: 104 mg/dL — ABNORMAL HIGH (ref 70–99)
Potassium: 3.7 mmol/L (ref 3.5–5.1)
SODIUM: 142 mmol/L (ref 135–145)

## 2018-09-09 LAB — CBC
HCT: 31.8 % — ABNORMAL LOW (ref 36.0–46.0)
Hemoglobin: 9.7 g/dL — ABNORMAL LOW (ref 12.0–15.0)
MCH: 26.1 pg (ref 26.0–34.0)
MCHC: 30.5 g/dL (ref 30.0–36.0)
MCV: 85.7 fL (ref 80.0–100.0)
NRBC: 0 % (ref 0.0–0.2)
PLATELETS: 358 10*3/uL (ref 150–400)
RBC: 3.71 MIL/uL — AB (ref 3.87–5.11)
RDW: 17.4 % — ABNORMAL HIGH (ref 11.5–15.5)
WBC: 8.2 10*3/uL (ref 4.0–10.5)

## 2018-09-09 LAB — POCT PREGNANCY, URINE: Preg Test, Ur: NEGATIVE

## 2018-09-09 SURGERY — MRI WITH ANESTHESIA
Anesthesia: General

## 2018-09-09 MED ORDER — FENTANYL CITRATE (PF) 100 MCG/2ML IJ SOLN
INTRAMUSCULAR | Status: AC
  Filled 2018-09-09: qty 2

## 2018-09-09 MED ORDER — FENTANYL CITRATE (PF) 100 MCG/2ML IJ SOLN
25.0000 ug | INTRAMUSCULAR | Status: DC | PRN
  Administered 2018-09-09 (×2): 50 ug via INTRAVENOUS

## 2018-09-09 MED ORDER — MIDAZOLAM HCL 2 MG/2ML IJ SOLN
INTRAMUSCULAR | Status: AC
  Filled 2018-09-09: qty 2

## 2018-09-09 MED ORDER — FENTANYL CITRATE (PF) 100 MCG/2ML IJ SOLN
INTRAMUSCULAR | Status: AC
  Administered 2018-09-09: 50 ug via INTRAVENOUS
  Filled 2018-09-09: qty 2

## 2018-09-09 MED ORDER — PROMETHAZINE HCL 25 MG/ML IJ SOLN: 6.2500 mg | INTRAMUSCULAR | Status: DC | PRN

## 2018-09-09 NOTE — H&P (Signed)
PREOPERATIVE H&P  Chief Complaint: HISTORY  OF LUMBER SPINE FUSION AND LUMBER APIN  HPI: Marilyn [name] is a 47 y.o. female who presents for preoperative history and physical with a diagnosis of HISTORY  OF LUMBER [place]. Symptoms are rated as moderate to severe, and have been worsening.  This is significantly impairing activities of daily living.  She has elected for surgical management.   Past Medical History:  Diagnosis Date  . Anemia    taking iron now. pt states having no current issues 09/02/2015  . Anginal pain ([place])    pt states experiences chest wall pain pt states related to her asthma   . Anxiety    with MRI's  . Arthritis    Everywhere  . Asthma   . Dizziness   . GERD (gastroesophageal reflux disease)   . Headache(784.0)    HX OF MIGRAINES  . History of bronchitis   . Hypertension   . Hypothyroidism    takes levothyroxen  . OSA on CPAP    wears cpap  . Shortness of breath    with exertion  . Wears glasses    Past Surgical History:  Procedure Laterality Date  . CESAREAN SECTION     times 2  . CHOLECYSTECTOMY    . ENDOMETRIAL ABLATION    . INCISION AND DRAINAGE HIP Left 11/10/2015   Procedure: IRRIGATION AND DEBRIDEMENT LEFT HIP INCISION;  Surgeon: Mcarthur Rossetti, MD;  Location: [place];  Service: Orthopedics;  Laterality: Left;  . JOINT REPLACEMENT  2011   total left knee  . KNEE ARTHROPLASTY  04/23/2012   right   . KNEE ARTHROSCOPY    . LUMBAR WOUND DEBRIDEMENT N/A 06/11/2018   Procedure: LUMBAR WOUND DEBRIDEMENT;  Surgeon: Jessy Oto, MD;  Location: [place];  Service: Orthopedics;  Laterality: N/A;  . ROTATOR CUFF REPAIR     left   . SHOULDER SURGERY     right to repair ligament tear  . TOTAL HIP ARTHROPLASTY Left 09/09/2015   Procedure: LEFT TOTAL HIP ARTHROPLASTY ANTERIOR APPROACH;  Surgeon: Mcarthur Rossetti, MD;  Location: [facility];  Service: Orthopedics;  Laterality: Left;  . TOTAL KNEE ARTHROPLASTY  04/23/2012   Procedure: TOTAL KNEE ARTHROPLASTY;  Surgeon: Newt Minion, MD;  Location: [place];  Service: Orthopedics;  Laterality: Right;  Right Total Knee Arthroplasty  . TUBAL LIGATION  1996   Social History   Socioeconomic History  . Marital status: Single    Spouse name: Not on file  . Number of children: Not on file  . Years of education: Not on file  . Highest education level: Not on file  Occupational History  . Not on file  Social Needs  . Financial resource strain: Not on file  . Food insecurity:    Worry: Not on file    Inability: Not on file  . Transportation needs:    Medical: Not on file    Non-medical: Not on file  Tobacco Use  . Smoking status: Former Smoker    Packs/day: 0.50    Years: 4.00    Pack years: 2.00    Last attempt to quit: 09/18/1991    Years since quitting: 26.9  . Smokeless tobacco: Never Used  Substance and Sexual Activity  . Alcohol use: No  . Drug use: No  . Sexual activity: Yes    Birth control/protection: Surgical  Lifestyle  . Physical activity:    Days per week: Not on file  Minutes per session: Not on file  . Stress: Not on file  Relationships  . Social connections:    Talks on phone: Not on file    Gets together: Not on file    Attends religious service: Not on file    Active member of club or organization: Not on file    Attends meetings of clubs or organizations: Not on file    Relationship status: Not on file  Other Topics Concern  . Not on file  Social History Narrative  . Not on file   Family History  Problem Relation Age of Onset  . Cancer Mother        colon  . Epilepsy Mother   . Cancer Father        prostate  . Diabetes Father   . Hypertension Father   . Hypertension Maternal Aunt   . Diabetes Maternal Aunt   . Hypertension Paternal Aunt    Allergies  Allergen Reactions  . Lisinopril Anaphylaxis  . Bee Venom Swelling and Other (See Comments)    On site swelling  . Propoxyphene Hives  . Codeine Nausea Only  .  Latex Rash  . Meloxicam Other (See Comments)    Insomnia, constipation  . Morphine And Related Rash  . Tomato Rash   Prior to Admission medications   Medication Sig Start Date End Date Taking? Authorizing Provider  albuterol (PROVENTIL HFA;VENTOLIN HFA) 108 (90 BASE) MCG/ACT inhaler Inhale 2 puffs into the lungs every 6 (six) hours as needed for wheezing or shortness of breath.   Yes [provider]  allopurinol (ZYLOPRIM) 100 MG tablet Take 1 tablet (100 mg total) by mouth 2 (two) times daily. 06/19/18  Yes Jessy Oto, MD  cetirizine (ZYRTEC) 10 MG tablet Take 10 mg by mouth daily.    Yes [provider]  cyclobenzaprine (FLEXERIL) 10 MG tablet TAKE 1 TABLET BY MOUTH THREE TIMES DAILY AS NEEDED FOR MUSCLE SPASM Patient taking differently: Take 10 mg by mouth 3 (three) times daily.  08/07/18  Yes Jessy Oto, MD  diclofenac sodium (VOLTAREN) 1 % GEL Apply 4 g topically 4 (four) times daily. Patient taking differently: Apply 4 g topically 4 (four) times daily as needed (pain).  06/19/18  Yes Jessy Oto, MD  diphenhydrAMINE HCl, Sleep, (SOBA NIGHTTIME SLEEP AID) 50 MG CAPS Take 50 mg by mouth at bedtime.   Yes [provider]  docusate sodium (COLACE) 100 MG capsule Take 1 capsule (100 mg total) by mouth 2 (two) times daily. 05/30/18  Yes Jessy Oto, MD  ergocalciferol (VITAMIN D2) 50000 units capsule Take 50,000 Units by mouth every Monday.    Yes [provider]  ferrous gluconate (FERGON) 324 MG tablet Take 1 tablet (324 mg total) by mouth 2 (two) times daily with a meal. 05/30/18  Yes Jessy Oto, MD  furosemide (LASIX) 20 MG tablet Take 20 mg by mouth daily as needed for fluid.  11/01/17  Yes [provider]  gabapentin (NEURONTIN) 300 MG capsule Take 1 capsule (300 mg total) by mouth at bedtime. Patient taking differently: Take 300 mg by mouth 3 (three) times daily.  07/23/18  Yes Jessy Oto, MD  hydrochlorothiazide 25 MG tablet  Take 25 mg by mouth daily.    Yes [provider]  levofloxacin (LEVAQUIN) 750 MG tablet Take 1 tablet (750 mg total) by mouth daily. 07/22/18  Yes Campbell Riches, MD  levothyroxine (SYNTHROID, LEVOTHROID) 137 MCG tablet Take  137 mcg by mouth daily before breakfast.  05/31/17  Yes [provider]  oxybutynin (DITROPAN-XL) 10 MG 24 hr tablet Take 10 mg by mouth daily.  05/31/17  Yes [provider]  oxyCODONE-acetaminophen (PERCOCET) 7.5-325 MG tablet Take 1 tablet by mouth every 8 (eight) hours as needed for up to 7 days for severe pain. 09/03/18 09/10/18 Yes Lanae Crumbly, PA-C  potassium chloride (K-DUR,KLOR-CON) 10 MEQ tablet Take 10 mEq by mouth 2 (two) times daily.    Yes [provider]  albuterol (PROVENTIL) (2.5 MG/3ML) 0.083% nebulizer solution Take 3 mLs (2.5 mg total) by nebulization 3 (three) times daily. 07/19/16   Thurnell Lose, MD  ALPRAZolam Duanne Moron) 1 MG tablet take 1 tablet 1 hour prior to MRI repeat 30  Ins prior if needed 08/06/18   Jessy Oto, MD  diphenhydrAMINE (BENADRYL) 25 MG tablet Take 1 tablet (25 mg total) by mouth every 6 (six) hours. Patient taking differently: Take 25 mg by mouth every 6 (six) hours as needed for itching or allergies.  09/29/15   Jola Schmidt, MD  NARCAN 4 MG/0.1ML LIQD nasal spray kit Place 0.4 mg into the nose once.  06/28/17   [provider]  nutrition supplement, JUVEN, (JUVEN) PACK Take 1 packet by mouth 2 (two) times daily between meals. 06/19/18   Jessy Oto, MD  pantoprazole (PROTONIX) 20 MG tablet Take 1 tablet (20 mg total) by mouth 2 (two) times daily before a meal. 03/13/18   Virgel Manifold, MD     Positive ROS: All other systems have been reviewed and were otherwise negative with the exception of those mentioned in the HPI and as above.  Physical Exam: General: Alert, no acute distress Cardiovascular: No pedal edema Respiratory: No cyanosis, no use of accessory musculature GI:  No organomegaly, abdomen is soft and non-tender Skin: No lesions in the area of chief complaint Neurologic: Sensation intact distally Psychiatric: Patient is competent for consent with normal mood and affect Lymphatic: No axillary or cervical lymphadenopathy  MUSCULOSKELETAL: Right leg pain with postive SLR weak right foot DF. Operative incision is healing.    Assessment: HISTORY  OF LUMBER SPINE FUSION AND LUMBER PAIN AND OPPOSITE RIGHT LEG PAIN POST DECOMPRESSION AND FUSION L4-5 History of post operative wound complications assess for osteomyelitis or  Abscess, persistent elevated CRP and sed rate post op.  Plan: Plan for Procedure(s): LUMBER SPINE WITHOUT CONTRAST  The risks benefits and alternatives were discussed with the patient including but not limited to the risks of nonoperative treatment, versus surgical intervention including infection, bleeding, nerve injury,  blood clots, cardiopulmonary complications, morbidity, mortality, among others, and they were willing to proceed.   Basil Dess, MD Cell [phone] Office [phone] 09/09/2018 11:19 AM

## 2018-09-09 NOTE — Transfer of Care (Signed)
Immediate Anesthesia Transfer of Care Note  Patient: Marilyn [name]  Procedure(s) Performed: LUMBER SPINE WITHOUT CONTRAST (N/A )  Patient Location: PACU  Anesthesia Type:General  Level of Consciousness: awake, alert , oriented and patient cooperative  Airway & Oxygen Therapy: Patient Spontanous Breathing  Post-op Assessment: Report given to RN, Post -op Vital signs reviewed and stable and Patient moving all extremities X 4  Post vital signs: Reviewed and stable  Last Vitals:  Vitals Value Taken Time  BP 144/78 09/09/2018  9:21 AM  Temp    Pulse 91 09/09/2018  9:22 AM  Resp 21 09/09/2018  9:22 AM  SpO2 99 % 09/09/2018  9:22 AM  Vitals shown include unvalidated device data.  Last Pain:  Vitals:   09/09/18 0706  TempSrc:   PainSc: 4       Patients Stated Pain Goal: 4 (09/09/18 0706)  Complications: No apparent anesthesia complications

## 2018-09-09 NOTE — Anesthesia Preprocedure Evaluation (Signed)
Anesthesia Evaluation  Patient identified by MRN, date of birth, ID band Patient awake    Reviewed: Allergy & Precautions, NPO status , Patient's Chart, lab work & pertinent test results  Airway Mallampati: II  TM Distance: >3 FB Neck ROM: Full    Dental no notable dental hx.    Pulmonary asthma , sleep apnea , former smoker,    Pulmonary exam normal breath sounds clear to auscultation       Cardiovascular hypertension, Normal cardiovascular exam Rhythm:Regular Rate:Normal     Neuro/Psych negative neurological ROS  negative psych ROS   GI/Hepatic negative GI ROS, Neg liver ROS,   Endo/Other  Hypothyroidism Morbid obesity  Renal/GU negative Renal ROS  negative genitourinary   Musculoskeletal negative musculoskeletal ROS (+)   Abdominal   Peds negative pediatric ROS (+)  Hematology negative hematology ROS (+)   Anesthesia Other Findings   Reproductive/Obstetrics negative OB ROS                             Anesthesia Physical Anesthesia Plan  ASA: III  Anesthesia Plan: General   Post-op Pain Management:    Induction: Intravenous  PONV Risk Score and Plan: 3 and Ondansetron, Dexamethasone and Treatment may vary due to age or medical condition  Airway Management Planned: Oral ETT  Additional Equipment:   Intra-op Plan:   Post-operative Plan: Extubation in OR  Informed Consent: I have reviewed the patients History and Physical, chart, labs and discussed the procedure including the risks, benefits and alternatives for the proposed anesthesia with the patient or authorized representative who has indicated his/her understanding and acceptance.   Dental advisory given  Plan Discussed with: CRNA and Surgeon  Anesthesia Plan Comments:         Anesthesia Quick Evaluation

## 2018-09-09 NOTE — Anesthesia Postprocedure Evaluation (Signed)
Anesthesia Post Note  Patient: Marilyn [name]  Procedure(s) Performed: LUMBER SPINE WITHOUT CONTRAST (N/A )     Patient location during evaluation: PACU Anesthesia Type: General Level of consciousness: awake and alert Pain management: pain level controlled Vital Signs Assessment: post-procedure vital signs reviewed and stable Respiratory status: spontaneous breathing, nonlabored ventilation, respiratory function stable and patient connected to nasal cannula oxygen Cardiovascular status: blood pressure returned to baseline and stable Postop Assessment: no apparent nausea or vomiting Anesthetic complications: no    Last Vitals:  Vitals:   09/09/18 1003 09/09/18 1004  BP:  (!) 156/78  Pulse: 74 76  Resp: 18 (!) 21  Temp:  36.6 C  SpO2: 100% 100%    Last Pain:  Vitals:   09/09/18 1004  TempSrc:   PainSc: 3                  Lux Meaders S

## 2018-09-09 NOTE — Anesthesia Procedure Notes (Signed)
Procedure Name: Intubation Date/Time: 09/09/2018 8:11 AM Performed by: Burt Ekurner, Holly Iannaccone Ashley, CRNA Pre-anesthesia Checklist: Patient identified, Emergency Drugs available, Suction available and Patient being monitored Patient Re-evaluated:Patient Re-evaluated prior to induction Oxygen Delivery Method: Circle system utilized Preoxygenation: Pre-oxygenation with 100% oxygen Induction Type: IV induction Ventilation: Mask ventilation without difficulty Laryngoscope Size: Glidescope and 3 Grade View: Grade I Tube type: Oral Tube size: 7.0 mm Number of attempts: 1 Airway Equipment and Method: Stylet and Video-laryngoscopy Placement Confirmation: ETT inserted through vocal cords under direct vision,  positive ETCO2 and breath sounds checked- equal and bilateral Secured at: 22 cm Tube secured with: Tape Dental Injury: Teeth and Oropharynx as per pre-operative assessment

## 2018-09-11 ENCOUNTER — Other Ambulatory Visit (INDEPENDENT_AMBULATORY_CARE_PROVIDER_SITE_OTHER): Payer: Self-pay | Admitting: Specialist

## 2018-09-11 ENCOUNTER — Encounter (HOSPITAL_COMMUNITY): Payer: Self-pay | Admitting: Radiology

## 2018-09-11 ENCOUNTER — Ambulatory Visit (INDEPENDENT_AMBULATORY_CARE_PROVIDER_SITE_OTHER): Payer: Medicare Other | Admitting: Specialist

## 2018-09-11 NOTE — Telephone Encounter (Signed)
Cyclobenzaprine refill request----Can you please advise?

## 2018-09-11 NOTE — Telephone Encounter (Signed)
Addressed.

## 2018-09-12 ENCOUNTER — Telehealth (INDEPENDENT_AMBULATORY_CARE_PROVIDER_SITE_OTHER): Payer: Self-pay | Admitting: Specialist

## 2018-09-12 ENCOUNTER — Other Ambulatory Visit (INDEPENDENT_AMBULATORY_CARE_PROVIDER_SITE_OTHER): Payer: Self-pay | Admitting: Radiology

## 2018-09-12 ENCOUNTER — Other Ambulatory Visit (INDEPENDENT_AMBULATORY_CARE_PROVIDER_SITE_OTHER): Payer: Self-pay | Admitting: Orthopaedic Surgery

## 2018-09-12 MED ORDER — OXYCODONE-ACETAMINOPHEN 5-325 MG PO TABS: 1.0000 | ORAL_TABLET | Freq: Three times a day (TID) | ORAL | 0 refills | Status: DC | PRN

## 2018-09-12 NOTE — Telephone Encounter (Signed)
Patient called stating that her pharmacy does not have the capability to send RX refill via fax.  Patient would like for you to call her when the RX is ready for pick up, her daughter will be coming to get it.  CB#[phone].  Thank you.

## 2018-09-12 NOTE — Telephone Encounter (Signed)
done

## 2018-09-12 NOTE — Telephone Encounter (Signed)
Patient calling to reschedule appt that was cancelled by our office on 09-11-18 with Dr. Otelia SergeantNitka.  She is asking if someone will provide her with the results of the MRI.   Pt's cb [phone]

## 2018-09-12 NOTE — Telephone Encounter (Signed)
Done

## 2018-09-15 ENCOUNTER — Encounter (INDEPENDENT_AMBULATORY_CARE_PROVIDER_SITE_OTHER): Payer: Self-pay | Admitting: Physician Assistant

## 2018-09-15 ENCOUNTER — Ambulatory Visit (INDEPENDENT_AMBULATORY_CARE_PROVIDER_SITE_OTHER): Payer: Medicare Other | Admitting: Physician Assistant

## 2018-09-15 DIAGNOSIS — M4326 Fusion of spine, lumbar region: Secondary | ICD-10-CM

## 2018-09-15 DIAGNOSIS — M7061 Trochanteric bursitis, right hip: Secondary | ICD-10-CM

## 2018-09-15 MED FILL — Propofol IV Emul 200 MG/20ML (10 MG/ML): INTRAVENOUS | Qty: 20 | Status: AC

## 2018-09-15 MED FILL — Phenylephrine-NaCl Pref Syr 0.4 MG/10ML-0.9% (40 MCG/ML): INTRAVENOUS | Qty: 10 | Status: AC

## 2018-09-15 MED FILL — Lidocaine HCl(Cardiac) IV PF Soln Pref Syr 100 MG/5ML (2%): INTRAVENOUS | Qty: 5 | Status: AC

## 2018-09-15 MED FILL — Midazolam HCl Inj 2 MG/2ML (Base Equivalent): INTRAMUSCULAR | Qty: 2 | Status: AC

## 2018-09-15 MED FILL — Fentanyl Citrate Preservative Free (PF) Inj 100 MCG/2ML: INTRAMUSCULAR | Qty: 2 | Status: AC

## 2018-09-15 MED FILL — Succinylcholine Chloride Sol Pref Syr 200 MG/10ML (20 MG/ML): INTRAVENOUS | Qty: 10 | Status: AC

## 2018-09-15 NOTE — Progress Notes (Signed)
HPI: [name] 47 year old female with a history of a lumbar spinal fusion by Dr. Otelia SergeantNitka.  Comes in today for wound check and also to go over her MRI of her lumbar spine.  She denies any fever.  She does have occasional chills and has some drainage that waxes and wanes from the surgical incision lower lumbar region.  Stabbing pain lateral aspect of her right leg.  Denies any numbness tingling. MRI results were reviewed with the patient also reviewed the MRI with Dr. Ophelia CharterYates today.  Patient status post L4-S1 TLIF.  No concerning features for abscess, osteomyelitis or discitis.  No extra-spinal fluid collection.  L4-5 improved stenosis and subarticular zone narrowing.  Residual disc material and facet arthropathy narrowing on the right which could potentially affect the right L4 nerve root.  L5-S1 improved stenosis and subarticular zone narrowing.  Residual does material multi-factorial foraminal narrowing bilaterally which could affect the L V nerve root bilaterally.  Physical exam: General well-developed well-nourished female no acute distress mood affect appropriate.  Psych alert and oriented x3. Lumbar incision: Open wound approximately half a centimeter in width and half a centimeter in length.  Good granular tissue.  No malodor.  No purulent drainage.  There is probed with a Q-tip and there is no significant tracking. Left hip: Good range of motion.  Tenderness over the left trochanteric region and down the left IT band to the knee.  Impression: History of spinal fusion and lumbar pain right leg Right hip trochanteric bursitis  Plan: Wound was packed with Xeroform today should continue current wound care.  In regards to trochanteric bursitis discussed some IT band stretching.  She will follow-up with Dr. Otelia SergeantNitka in the near future to go over the MRI in detail.  Questions were encouraged and answered.

## 2018-09-17 HISTORY — PX: COLONOSCOPY: SHX174

## 2018-09-18 ENCOUNTER — Telehealth (INDEPENDENT_AMBULATORY_CARE_PROVIDER_SITE_OTHER): Payer: Self-pay | Admitting: Specialist

## 2018-09-18 ENCOUNTER — Ambulatory Visit (INDEPENDENT_AMBULATORY_CARE_PROVIDER_SITE_OTHER): Payer: Medicare Other | Admitting: Infectious Diseases

## 2018-09-18 ENCOUNTER — Encounter: Payer: Self-pay | Admitting: Infectious Diseases

## 2018-09-18 DIAGNOSIS — T8141XA Infection following a procedure, superficial incisional surgical site, initial encounter: Secondary | ICD-10-CM

## 2018-09-18 DIAGNOSIS — Z6841 Body Mass Index (BMI) 40.0 and over, adult: Secondary | ICD-10-CM | POA: Diagnosis not present

## 2018-09-18 DIAGNOSIS — T8142XA Infection following a procedure, deep incisional surgical site, initial encounter: Secondary | ICD-10-CM

## 2018-09-18 DIAGNOSIS — T8142XD Infection following a procedure, deep incisional surgical site, subsequent encounter: Secondary | ICD-10-CM | POA: Diagnosis not present

## 2018-09-18 MED ORDER — LEVOFLOXACIN 750 MG PO TABS: 750.0000 mg | ORAL_TABLET | Freq: Every day | ORAL | 2 refills | Status: DC

## 2018-09-18 NOTE — Assessment & Plan Note (Signed)
Currently wt loss plan on hold while she manages her back.  Prev was going to gym.

## 2018-09-18 NOTE — Telephone Encounter (Signed)
[facility]  [place] Rain  [phone]    Patient Home Health Therapy is ending next week. Patient is currently independent with her wound care due to daughter taking care of her. Patient will be discharged, if Dr.Nitka feels like treatment should continue they will need further orders.

## 2018-09-18 NOTE — Progress Notes (Signed)
   Subjective:    Patient ID: Marilyn [name], female    DOB: 1971-04-12, 48 y.o.   MRN: [id number]  HPI 48 y.o.f with hx of L4-L5 and L5 S1 TLIF lumbar fusion on 05/27/18, who returned for f/u and was found to have wound d/c. She was admitted and had lumbar wound debridement 06-11-18. Her cx grew P aeruginosa (pan-sens).  She was treated with cefepime in hospital and then d/c on same on 06-19-18 with VAC in place.  She has had ongoing isssues with back pain.  She was seen in ID on 11-5 and felt to be doing well. She completed IV anbx on 11-7 and was transitioned to levaquin.  Is feeling well. Her wound has nearly healed. Her RN told her it would be closed in 2 more weeks.   CRP 20.9,  ESR 67 on 08-18-18  Review of Systems  Constitutional: Negative for chills and fever.  Gastrointestinal: Negative for constipation and diarrhea.  Genitourinary: Negative for difficulty urinating.  Musculoskeletal: Negative for back pain.  Skin: Positive for wound.  Please see HPI. All other systems reviewed and negative.      Objective:   Physical Exam Constitutional:      Appearance: Normal appearance. She is obese.  HENT:     Head: Normocephalic and atraumatic.     Mouth/Throat:     Mouth: Mucous membranes are moist.     Pharynx: No oropharyngeal exudate.  Eyes:     Extraocular Movements: Extraocular movements intact.     Pupils: Pupils are equal, round, and reactive to light.  Neck:     Musculoskeletal: Neck supple.  Cardiovascular:     Rate and Rhythm: Normal rate and regular rhythm.  Pulmonary:     Effort: Pulmonary effort is normal.     Breath sounds: Normal breath sounds.  Abdominal:     General: Bowel sounds are normal.     Palpations: Abdomen is soft.  Musculoskeletal:        General: No swelling.       Arms:  Neurological:     Mental Status: She is alert.  Psychiatric:        Mood and Affect: Mood normal.       Assessment & Plan:

## 2018-09-18 NOTE — Telephone Encounter (Signed)
Can you have Bronson Curb to look at this, he was supposed to have seen her on Monday

## 2018-09-18 NOTE — Assessment & Plan Note (Signed)
She is doing well. Pain better Will check her ESR and CRP today Will continue levaqun (refilled) til wound heals Will see her back in 6 week.

## 2018-09-19 LAB — SEDIMENTATION RATE

## 2018-09-19 LAB — C-REACTIVE PROTEIN: CRP: 16.3 mg/L — ABNORMAL HIGH (ref <8.0)

## 2018-09-19 NOTE — Telephone Encounter (Signed)
Can you advise on this one?  You saw patient on 09/15/18.

## 2018-09-22 ENCOUNTER — Telehealth (INDEPENDENT_AMBULATORY_CARE_PROVIDER_SITE_OTHER): Payer: Self-pay | Admitting: Specialist

## 2018-09-22 NOTE — Telephone Encounter (Signed)
Shraddha-nurse from [facility] called left voicemail message asking if she should continue services or discharge patient. The number to contact Harlin Rain is [phone]

## 2018-09-22 NOTE — Telephone Encounter (Signed)
Can you call them? Thanks.

## 2018-09-22 NOTE — Telephone Encounter (Signed)
They can continue wound care on their own , wound looked great at appointment.

## 2018-09-23 NOTE — Telephone Encounter (Signed)
I called and advised that we will see patient on Tuesday and will eval for service then

## 2018-09-23 NOTE — Telephone Encounter (Signed)
I called and advised that we will see her on Tuesday and make a decision at that time.

## 2018-09-24 ENCOUNTER — Other Ambulatory Visit (INDEPENDENT_AMBULATORY_CARE_PROVIDER_SITE_OTHER): Payer: Self-pay | Admitting: Surgery

## 2018-09-24 ENCOUNTER — Other Ambulatory Visit (INDEPENDENT_AMBULATORY_CARE_PROVIDER_SITE_OTHER): Payer: Self-pay | Admitting: Radiology

## 2018-09-24 MED ORDER — TRAMADOL HCL 50 MG PO TABS: ORAL_TABLET | ORAL | 0 refills | Status: DC

## 2018-09-24 NOTE — Telephone Encounter (Signed)
I called rx to the pharmacy, and called patient and advised this was done

## 2018-09-24 NOTE — Telephone Encounter (Signed)
Ok to write for tramadol 50mg  1-2 q6-8 #30, no refills

## 2018-09-24 NOTE — Telephone Encounter (Signed)
Patient called needing Rx refilled (Percocet) The number to contact patient is [phone]

## 2018-09-30 ENCOUNTER — Ambulatory Visit (INDEPENDENT_AMBULATORY_CARE_PROVIDER_SITE_OTHER): Payer: Medicare Other | Admitting: Specialist

## 2018-09-30 ENCOUNTER — Encounter (INDEPENDENT_AMBULATORY_CARE_PROVIDER_SITE_OTHER): Payer: Self-pay | Admitting: Specialist

## 2018-09-30 VITALS — BP 156/83 | HR 73 | Ht 63.0 in | Wt 316.0 lb

## 2018-09-30 DIAGNOSIS — T8141XA Infection following a procedure, superficial incisional surgical site, initial encounter: Secondary | ICD-10-CM

## 2018-09-30 DIAGNOSIS — M5416 Radiculopathy, lumbar region: Secondary | ICD-10-CM

## 2018-09-30 DIAGNOSIS — M48062 Spinal stenosis, lumbar region with neurogenic claudication: Secondary | ICD-10-CM | POA: Diagnosis not present

## 2018-09-30 DIAGNOSIS — Z981 Arthrodesis status: Secondary | ICD-10-CM | POA: Diagnosis not present

## 2018-09-30 MED ORDER — OXYCODONE-ACETAMINOPHEN 5-325 MG PO TABS: 1.0000 | ORAL_TABLET | Freq: Three times a day (TID) | ORAL | 0 refills | Status: DC | PRN

## 2018-09-30 NOTE — Progress Notes (Signed)
Office Visit Note   Patient: Marilyn [name]           Date of Birth: 07-12-1971           MRN: [id number] Visit Date: 09/30/2018              Requested by: Joette Catching, MD [address] [place], [place] [address] PCP: Joette Catching, MD   Assessment & Plan: Visit Diagnoses:  1. Status post lumbar spinal fusion   2. Infection of superficial incisional surgical site after procedure   3. Spinal stenosis of lumbar region with neurogenic claudication   4. Radiculopathy, lumbar region     Plan: Avoid bending, stooping and avoid lifting weights greater than 10 lbs. Avoid prolong standing and walking. Avoid frequent bending and stooping  No lifting greater than 10 lbs. May use ice or moist heat for pain. Weight loss is of benefit. Handicap license is approved. Dr. [place] Blas secretary/Assistant will call to arrange for Selective nerve root block injection right L4.   Follow-Up Instructions: No follow-ups on file.   Orders:  Orders Placed This Encounter  Procedures  . Ambulatory referral to Physical Medicine Rehab   Meds ordered this encounter  Medications  . oxyCODONE-acetaminophen (PERCOCET/ROXICET) 5-325 MG tablet    Sig: Take 1 tablet by mouth every 8 (eight) hours as needed for severe pain.    Dispense:  25 tablet    Refill:  0      Procedures: No procedures performed   Clinical Data: No additional findings.   Subjective: Chief Complaint  Patient presents with  . Spine - Follow-up    HPI  Review of Systems  Constitutional: Negative for activity change, appetite change, chills, diaphoresis, fatigue, fever and unexpected weight change.  HENT: Negative.   Respiratory: Negative.   Cardiovascular: Negative.   Gastrointestinal: Negative.  Negative for blood in stool.  Endocrine: Positive for cold intolerance.  Genitourinary: Negative.   Musculoskeletal: Positive for back pain.  Skin: Negative.   Allergic/Immunologic: Negative.   Neurological:  Positive for weakness and numbness.  Hematological: Negative.   Psychiatric/Behavioral: Negative.      Objective: Vital Signs: BP (!) 156/83   Pulse 73   Ht 5\' 3"  (1.6 m)   Wt (!) 316 lb (143.3 kg)   BMI 55.98 kg/m   Physical Exam Constitutional:      Appearance: She is well-developed.  HENT:     Head: Normocephalic and atraumatic.  Eyes:     Pupils: Pupils are equal, round, and reactive to light.  Neck:     Musculoskeletal: Normal range of motion and neck supple.  Pulmonary:     Effort: Pulmonary effort is normal.     Breath sounds: Normal breath sounds.  Abdominal:     General: Bowel sounds are normal.     Palpations: Abdomen is soft.  Musculoskeletal: Normal range of motion.  Skin:    General: Skin is warm and dry.  Neurological:     Mental Status: She is alert and oriented to person, place, and time.  Psychiatric:        Behavior: Behavior normal.        Thought Content: Thought content normal.        Judgment: Judgment normal.     Ortho Exam  Specialty Comments:  No specialty comments available.  Imaging: No results found.   PMFS History: Patient Active Problem List   Diagnosis Date Noted  . Anemia due to blood loss, acute 06/18/2018  Priority: High    Class: Acute  . Superficial incisional surgical site infection 06/11/2018    Priority: High    Class: Acute  . Right ankle effusion 06/11/2018    Priority: High    Class: Acute  . Morbid (severe) obesity due to excess calories (HCC) 05/29/2018    Priority: High    Class: Chronic  . Spondylolisthesis, lumbar region 05/27/2018    Priority: High    Class: Chronic  . Spinal stenosis of lumbar region 05/27/2018    Priority: High    Class: Chronic  . Urinary tract infection 05/27/2018    Priority: High    Class: Acute  . Plantar fasciitis of left foot 06/16/2018    Priority: Medium    Class: Chronic  . Tendonitis, Achilles, right 06/16/2018    Priority: Medium    Class: Chronic  .  Osteochondral talar dome lesion 06/16/2018    Priority: Medium    Class: Chronic  . Deep incisional surgical site infection   . Fusion of spine of lumbar region 05/27/2018  . Pain of right hip joint 07/03/2017  . Acute right-sided low back pain with right-sided sciatica 03/27/2017  . Chronic right shoulder pain 02/13/2017  . History of rotator cuff tear 11/30/2016  . Impingement syndrome of right shoulder 11/30/2016  . Exacerbation of asthma 07/18/2016  . Hypokalemia 07/18/2016  . Hypertension 07/18/2016  . Depression with anxiety 07/18/2016  . Hypothyroidism 07/18/2016  . Hyperglycemia 07/18/2016  . Acute asthma exacerbation 07/18/2016  . Surgical wound dehiscence left hip; questionable superficial infection 11/10/2015  . Left hip postoperative wound infection 11/10/2015  . Osteoarthritis of left hip 09/09/2015  . 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 Obesity (BMI 35.0-39.9 without comorbidity) 04/28/2013   Past Medical History:  Diagnosis Date  . Anemia    taking iron now. pt states having no current issues 09/02/2015  . Anginal pain (HCC)    pt states experiences chest wall pain pt states related to her asthma   . Anxiety    with MRI's  . Arthritis    Everywhere  . Asthma   . Dizziness   . GERD (gastroesophageal reflux disease)   . Headache(784.0)    HX OF MIGRAINES  . History of bronchitis   . Hypertension   . Hypothyroidism    takes levothyroxen  . OSA on CPAP    wears cpap  . Shortness of breath    with exertion  . Wears glasses     Family History  Problem Relation Age of Onset  . Cancer Mother        colon  . Epilepsy Mother   . Cancer Father        prostate  . Diabetes Father   . Hypertension Father   . Hypertension Maternal Aunt   . Diabetes Maternal Aunt   . Hypertension Paternal Aunt     Past Surgical History:  Procedure Laterality Date  . CESAREAN SECTION     times 2  . CHOLECYSTECTOMY    . ENDOMETRIAL ABLATION    .  INCISION AND DRAINAGE HIP Left 11/10/2015   Procedure: IRRIGATION AND DEBRIDEMENT LEFT HIP INCISION;  Surgeon: Kathryne Hitchhristopher Y Blackman, MD;  Location: [facility] OR;  Service: Orthopedics;  Laterality: Left;  . JOINT REPLACEMENT  2011   total left knee  . KNEE ARTHROPLASTY  04/23/2012   right   . KNEE ARTHROSCOPY    . LUMBAR WOUND DEBRIDEMENT N/A 06/11/2018   Procedure: LUMBAR WOUND  DEBRIDEMENT;  Surgeon: Kerrin ChampagneNitka, Lakena Sparlin E, MD;  [facility];  Service: Orthopedics;  Laterality: N/A;  . RADIOLOGY WITH ANESTHESIA N/A 09/09/2018   Procedure: LUMBER SPINE WITHOUT CONTRAST;  Surgeon: Radiologist, Medication, MD;  Location: [facility] OR;  Service: Radiology;  Laterality: N/A;  . ROTATOR CUFF REPAIR     left   . SHOULDER SURGERY     right to repair ligament tear  . TOTAL HIP ARTHROPLASTY Left 09/09/2015   Procedure: LEFT TOTAL HIP ARTHROPLASTY ANTERIOR APPROACH;  Surgeon: Kathryne Hitchhristopher Y Blackman, MD;  Location: [facility];  Service: Orthopedics;  Laterality: Left;  . TOTAL KNEE ARTHROPLASTY  04/23/2012   Procedure: TOTAL KNEE ARTHROPLASTY;  Surgeon: Nadara MustardMarcus V Duda, MD;  Location: [facility] OR;  Service: Orthopedics;  Laterality: Right;  Right Total Knee Arthroplasty  . TUBAL LIGATION  1996   Social History   Occupational History  . Not on file  Tobacco Use  . Smoking status: Former Smoker    Packs/day: 0.50    Years: 4.00    Pack years: 2.00    Last attempt to quit: 09/18/1991    Years since quitting: 27.0  . Smokeless tobacco: Never Used  Substance and Sexual Activity  . Alcohol use: No  . Drug use: No  . Sexual activity: Yes    Birth control/protection: Surgical

## 2018-09-30 NOTE — Patient Instructions (Signed)
Plan: Avoid bending, stooping and avoid lifting weights greater than 10 lbs. Avoid prolong standing and walking. Avoid frequent bending and stooping  No lifting greater than 10 lbs. May use ice or moist heat for pain. Weight loss is of benefit. Handicap license is approved. Dr. [place] Blas secretary/Assistant will call to arrange for Selective nerve root block injection right L4.

## 2018-10-02 ENCOUNTER — Telehealth (INDEPENDENT_AMBULATORY_CARE_PROVIDER_SITE_OTHER): Payer: Self-pay | Admitting: Specialist

## 2018-10-02 NOTE — Telephone Encounter (Signed)
Patient called stated ran out Saline Solution for cleaning her wound and need a RX asap.  Please call patient to advise.  [phone]

## 2018-10-03 NOTE — Telephone Encounter (Signed)
I called and she states that she was able to go to [place] and get a large bottle of saline

## 2018-10-07 ENCOUNTER — Telehealth (INDEPENDENT_AMBULATORY_CARE_PROVIDER_SITE_OTHER): Payer: Self-pay | Admitting: Specialist

## 2018-10-07 ENCOUNTER — Emergency Department (HOSPITAL_COMMUNITY)
Admission: EM | Admit: 2018-10-07 | Discharge: 2018-10-07 | Disposition: A | Discharge status: Home / Self Care | Payer: Medicare Other | Attending: Emergency Medicine | Admitting: Emergency Medicine

## 2018-10-07 ENCOUNTER — Encounter (HOSPITAL_COMMUNITY): Payer: Self-pay | Admitting: Emergency Medicine

## 2018-10-07 ENCOUNTER — Emergency Department (HOSPITAL_COMMUNITY): Payer: Medicare Other

## 2018-10-07 ENCOUNTER — Other Ambulatory Visit: Payer: Self-pay

## 2018-10-07 DIAGNOSIS — F329 Major depressive disorder, single episode, unspecified: Secondary | ICD-10-CM | POA: Insufficient documentation

## 2018-10-07 DIAGNOSIS — I10 Essential (primary) hypertension: Secondary | ICD-10-CM | POA: Diagnosis not present

## 2018-10-07 DIAGNOSIS — F419 Anxiety disorder, unspecified: Secondary | ICD-10-CM | POA: Diagnosis not present

## 2018-10-07 DIAGNOSIS — M25531 Pain in right wrist: Secondary | ICD-10-CM | POA: Insufficient documentation

## 2018-10-07 DIAGNOSIS — Z87891 Personal history of nicotine dependence: Secondary | ICD-10-CM | POA: Insufficient documentation

## 2018-10-07 DIAGNOSIS — Z9049 Acquired absence of other specified parts of digestive tract: Secondary | ICD-10-CM | POA: Diagnosis not present

## 2018-10-07 DIAGNOSIS — E039 Hypothyroidism, unspecified: Secondary | ICD-10-CM | POA: Insufficient documentation

## 2018-10-07 DIAGNOSIS — Z96653 Presence of artificial knee joint, bilateral: Secondary | ICD-10-CM | POA: Insufficient documentation

## 2018-10-07 DIAGNOSIS — Z91040 Latex allergy status: Secondary | ICD-10-CM | POA: Diagnosis not present

## 2018-10-07 DIAGNOSIS — J45909 Unspecified asthma, uncomplicated: Secondary | ICD-10-CM | POA: Diagnosis not present

## 2018-10-07 DIAGNOSIS — Z96642 Presence of left artificial hip joint: Secondary | ICD-10-CM | POA: Insufficient documentation

## 2018-10-07 DIAGNOSIS — Z79899 Other long term (current) drug therapy: Secondary | ICD-10-CM | POA: Insufficient documentation

## 2018-10-07 MED ORDER — DICLOFENAC SODIUM 75 MG PO TBEC: 75.0000 mg | DELAYED_RELEASE_TABLET | Freq: Two times a day (BID) | ORAL | 0 refills | Status: DC

## 2018-10-07 NOTE — ED Triage Notes (Signed)
Pt c/o of right wrist pain over 2 weeks.  Pt was seen for same at urgent care and given a brace but has no relief.

## 2018-10-07 NOTE — Telephone Encounter (Signed)
Patient called asked if she will need premeds prior to her procedure on 10/09/2018 8:30am. The number to contact patient is [phone]

## 2018-10-07 NOTE — Discharge Instructions (Addendum)
Keep your wrist splinted.  Elevate when possible.  Apply ice packs on and off to your wrist.  Call your orthopedic provider or Dr. Mort Sawyers office to arrange a follow-up appointment.

## 2018-10-08 ENCOUNTER — Other Ambulatory Visit (INDEPENDENT_AMBULATORY_CARE_PROVIDER_SITE_OTHER): Payer: Self-pay | Admitting: Radiology

## 2018-10-08 MED ORDER — AMOXICILLIN-POT CLAVULANATE 875-125 MG PO TABS: ORAL_TABLET | ORAL | 0 refills | Status: DC

## 2018-10-09 ENCOUNTER — Ambulatory Visit (INDEPENDENT_AMBULATORY_CARE_PROVIDER_SITE_OTHER): Payer: Medicare Other | Admitting: Physical Medicine and Rehabilitation

## 2018-10-09 ENCOUNTER — Encounter (INDEPENDENT_AMBULATORY_CARE_PROVIDER_SITE_OTHER): Payer: Self-pay | Admitting: Physical Medicine and Rehabilitation

## 2018-10-09 ENCOUNTER — Other Ambulatory Visit (INDEPENDENT_AMBULATORY_CARE_PROVIDER_SITE_OTHER): Payer: Self-pay | Admitting: Specialist

## 2018-10-09 ENCOUNTER — Ambulatory Visit (INDEPENDENT_AMBULATORY_CARE_PROVIDER_SITE_OTHER): Payer: Self-pay

## 2018-10-09 VITALS — BP 179/84 | HR 75 | Temp 97.6°F

## 2018-10-09 DIAGNOSIS — M5416 Radiculopathy, lumbar region: Secondary | ICD-10-CM | POA: Diagnosis not present

## 2018-10-09 MED ORDER — BETAMETHASONE SOD PHOS & ACET 6 (3-3) MG/ML IJ SUSP
12.0000 mg | Freq: Once | INTRAMUSCULAR | Status: AC
  Administered 2018-10-09: 12 mg

## 2018-10-09 NOTE — Patient Instructions (Signed)

## 2018-10-09 NOTE — Progress Notes (Signed)
° °  Numeric Pain Rating Scale and Functional Assessment °Average Pain 6 ° ° °In the last MONTH (on 0-10 scale) has pain interfered with the following? ° °1. General activity like being  able to carry out your everyday physical activities such as walking, climbing stairs, carrying groceries, or moving a chair?  °Rating(10) ° ° °+Driver, -BT, -Dye Allergies. ° °

## 2018-10-09 NOTE — ED Provider Notes (Addendum)
[facility] EMERGENCY DEPARTMENT Provider Note   CSN: [id number] Arrival date & time: 10/07/18  1751     History   Chief Complaint Chief Complaint  Patient presents with  . Wrist Pain    HPI Marilyn [name] is a 48 y.o. female.  HPI   Marilyn [name] is a 48 y.o. female who presents to the Emergency Department complaining of right wrist pain for 2 weeks.  She describes a constant aching pain to her distal wrist that is worse with movement and with picking up objects.  No known injury.  She states she was seen at a local urgent care and told she had "tendinitis" and given a Velcro wrist splint and prescription for prednisone.  She states her symptoms are not improving.  Pain does occasionally radiate into her forearm.  She denies numbness of her hand or fingers and significant swelling.  She is right-hand dominant.   Past Medical History:  Diagnosis Date  . Anemia    taking iron now. pt states having no current issues 09/02/2015  . Anginal pain ([place])    pt states experiences chest wall pain pt states related to her asthma   . Anxiety    with MRI's  . Arthritis    Everywhere  . Asthma   . Dizziness   . GERD (gastroesophageal reflux disease)   . Headache(784.0)    HX OF MIGRAINES  . History of bronchitis   . Hypertension   . Hypothyroidism    takes levothyroxen  . OSA on CPAP    wears cpap  . Shortness of breath    with exertion  . Wears glasses     Patient Active Problem List   Diagnosis Date Noted  . Anemia due to blood loss, acute 06/18/2018    Class: Acute  . Plantar fasciitis of left foot 06/16/2018    Class: Chronic  . Tendonitis, Achilles, right 06/16/2018    Class: Chronic  . Osteochondral talar dome lesion 06/16/2018    Class: Chronic  . Deep incisional surgical site infection   . Superficial incisional surgical site infection 06/11/2018    Class: Acute  . Right ankle effusion 06/11/2018    Class: Acute  . Morbid (severe) obesity due to  excess calories ([place]) 05/29/2018    Class: Chronic  . Spondylolisthesis, lumbar region 05/27/2018    Class: Chronic  . Spinal stenosis of lumbar region 05/27/2018    Class: Chronic  . Urinary tract infection 05/27/2018    Class: Acute  . Fusion of spine of lumbar region 05/27/2018  . Pain of right hip joint 07/03/2017  . Acute right-sided low back pain with right-sided sciatica 03/27/2017  . Chronic right shoulder pain 02/13/2017  . History of rotator cuff tear 11/30/2016  . Impingement syndrome of right shoulder 11/30/2016  . Exacerbation of asthma 07/18/2016  . Hypokalemia 07/18/2016  . Hypertension 07/18/2016  . Depression with anxiety 07/18/2016  . Hypothyroidism 07/18/2016  . Hyperglycemia 07/18/2016  . Acute asthma exacerbation 07/18/2016  . Surgical wound dehiscence left hip; questionable superficial infection 11/10/2015  . Left hip postoperative wound infection 11/10/2015  . Osteoarthritis of left hip 09/09/2015  . 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 Obesity (BMI 35.0-39.9 without comorbidity) 04/28/2013    Past Surgical History:  Procedure Laterality Date  . CESAREAN SECTION     times 2  . CHOLECYSTECTOMY    . ENDOMETRIAL ABLATION    . INCISION AND DRAINAGE HIP Left 11/10/2015  Procedure: IRRIGATION AND DEBRIDEMENT LEFT HIP INCISION;  Surgeon: Mcarthur Rossetti, MD;  Location: [place];  Service: Orthopedics;  Laterality: Left;  . JOINT REPLACEMENT  2011   total left knee  . KNEE ARTHROPLASTY  04/23/2012   right   . KNEE ARTHROSCOPY    . LUMBAR WOUND DEBRIDEMENT N/A 06/11/2018   Procedure: LUMBAR WOUND DEBRIDEMENT;  Surgeon: Jessy Oto, MD;  Location: [place];  Service: Orthopedics;  Laterality: N/A;  . RADIOLOGY WITH ANESTHESIA N/A 09/09/2018   Procedure: LUMBER SPINE WITHOUT CONTRAST;  Surgeon: Radiologist, Medication, MD;  Location: [place];  Service: Radiology;  Laterality: N/A;  . ROTATOR CUFF REPAIR     left   . SHOULDER SURGERY      right to repair ligament tear  . TOTAL HIP ARTHROPLASTY Left 09/09/2015   Procedure: LEFT TOTAL HIP ARTHROPLASTY ANTERIOR APPROACH;  Surgeon: Mcarthur Rossetti, MD;  Location: [facility];  Service: Orthopedics;  Laterality: Left;  . TOTAL KNEE ARTHROPLASTY  04/23/2012   Procedure: TOTAL KNEE ARTHROPLASTY;  Surgeon: Newt Minion, MD;  Location: [place];  Service: Orthopedics;  Laterality: Right;  Right Total Knee Arthroplasty  . TUBAL LIGATION  1996     OB History    Gravida  2   Para  2   Term  2   Preterm      AB      Living  2     SAB      TAB      Ectopic      Multiple      Live Births               Home Medications    Prior to Admission medications   Medication Sig Start Date End Date Taking? Authorizing Provider  albuterol (PROVENTIL HFA;VENTOLIN HFA) 108 (90 BASE) MCG/ACT inhaler Inhale 2 puffs into the lungs every 6 (six) hours as needed for wheezing or shortness of breath.    [provider]  albuterol (PROVENTIL) (2.5 MG/3ML) 0.083% nebulizer solution Take 3 mLs (2.5 mg total) by nebulization 3 (three) times daily. 07/19/16   Thurnell Lose, MD  allopurinol (ZYLOPRIM) 100 MG tablet Take 1 tablet (100 mg total) by mouth 2 (two) times daily. 06/19/18   Jessy Oto, MD  ALPRAZolam Duanne Moron) 1 MG tablet take 1 tablet 1 hour prior to MRI repeat 30  Ins prior if needed 08/06/18   Jessy Oto, MD  amoxicillin-clavulanate (AUGMENTIN) 875-125 MG tablet Take 1 tablet po  2 hours prior to procedure and 1 tablet po 8 hours after procedure 10/08/18   Jessy Oto, MD  cetirizine (ZYRTEC) 10 MG tablet Take 10 mg by mouth daily.     [provider]  diclofenac (VOLTAREN) 75 MG EC tablet Take 1 tablet (75 mg total) by mouth 2 (two) times daily. Take with food 10/07/18   Jari Carollo, PA-C  diclofenac sodium (VOLTAREN) 1 % GEL Apply 4 g topically 4 (four) times daily. Patient taking differently: Apply 4 g topically 4 (four) times daily as needed  (pain).  06/19/18   Jessy Oto, MD  diphenhydrAMINE (BENADRYL) 25 MG tablet Take 1 tablet (25 mg total) by mouth every 6 (six) hours. Patient taking differently: Take 25 mg by mouth every 6 (six) hours as needed for itching or allergies.  09/29/15   Jola Schmidt, MD  diphenhydrAMINE HCl, Sleep, (SOBA NIGHTTIME SLEEP AID) 50 MG CAPS Take 50 mg by mouth at bedtime.  [provider]  docusate sodium (COLACE) 100 MG capsule Take 1 capsule (100 mg total) by mouth 2 (two) times daily. 05/30/18   Jessy Oto, MD  ergocalciferol (VITAMIN D2) 50000 units capsule Take 50,000 Units by mouth every Monday.     [provider]  ferrous gluconate (FERGON) 324 MG tablet Take 1 tablet (324 mg total) by mouth 2 (two) times daily with a meal. 05/30/18   Jessy Oto, MD  furosemide (LASIX) 20 MG tablet Take 20 mg by mouth daily as needed for fluid.  11/01/17   [provider]  gabapentin (NEURONTIN) 300 MG capsule Take 1 capsule (300 mg total) by mouth at bedtime. Patient taking differently: Take 300 mg by mouth 3 (three) times daily.  07/23/18   Jessy Oto, MD  hydrochlorothiazide 25 MG tablet Take 25 mg by mouth daily.     [provider]  levofloxacin (LEVAQUIN) 750 MG tablet Take 1 tablet (750 mg total) by mouth daily. 09/18/18   Campbell Riches, MD  levothyroxine (SYNTHROID, LEVOTHROID) 137 MCG tablet Take 137 mcg by mouth daily before breakfast.  05/31/17   [provider]  NARCAN 4 MG/0.1ML LIQD nasal spray kit Place 0.4 mg into the nose once.  06/28/17   [provider]  oxybutynin (DITROPAN-XL) 10 MG 24 hr tablet Take 10 mg by mouth daily.  05/31/17   [provider]  oxyCODONE-acetaminophen (PERCOCET/ROXICET) 5-325 MG tablet Take 1 tablet by mouth every 8 (eight) hours as needed for severe pain. 09/30/18   Jessy Oto, MD  pantoprazole (PROTONIX) 20 MG tablet Take 1 tablet (20 mg total) by mouth 2 (two) times daily before a  meal. Patient taking differently: Take 20 mg by mouth 2 (two) times daily before a meal. prn 03/13/18   Virgel Manifold, MD  potassium chloride (K-DUR,KLOR-CON) 10 MEQ tablet Take 10 mEq by mouth 2 (two) times daily.     [provider]  zolpidem (AMBIEN) 10 MG tablet Take by mouth. 09/15/18 10/15/18  [provider]    Family History Family History  Problem Relation Age of Onset  . Cancer Mother        colon  . Epilepsy Mother   . Cancer Father        prostate  . Diabetes Father   . Hypertension Father   . Hypertension Maternal Aunt   . Diabetes Maternal Aunt   . Hypertension Paternal Aunt     Social History Social History   Tobacco Use  . Smoking status: Former Smoker    Packs/day: 0.50    Years: 4.00    Pack years: 2.00    Last attempt to quit: 09/18/1991    Years since quitting: 27.0  . Smokeless tobacco: Never Used  Substance Use Topics  . Alcohol use: No  . Drug use: No     Allergies   Lisinopril; Bee venom; Propoxyphene; Codeine; Latex; Meloxicam; Morphine and related; and Tomato   Review of Systems Review of Systems  Constitutional: Negative for chills and fever.  Musculoskeletal: Positive for arthralgias (Right wrist pain). Negative for joint swelling and neck pain.  Skin: Negative for color change and wound.  Neurological: Negative for weakness and numbness.     Physical Exam Updated Vital Signs BP (!) 184/80 (BP Location: Right Arm)   Pulse 77   Temp 98.2 F (36.8 C) (Oral)   Resp 20   Ht '5\' 3"'  (1.6 m)   Wt (!) 143.3 kg  SpO2 100%   BMI 55.98 kg/m   Physical Exam Vitals signs and nursing note reviewed.  Constitutional:      General: She is not in acute distress.    Appearance: Normal appearance. She is not toxic-appearing.  HENT:     Head: Atraumatic.  Neck:     Musculoskeletal: Normal range of motion.  Cardiovascular:     Rate and Rhythm: Normal rate and regular rhythm.     Pulses: Normal pulses.  Pulmonary:      Effort: Pulmonary effort is normal.     Breath sounds: Normal breath sounds.  Musculoskeletal:        General: Tenderness present. No swelling or signs of injury.     Right wrist: She exhibits tenderness. She exhibits no swelling, no crepitus and no deformity.     Comments: Tenderness to palpation over the anatomical snuffbox and proximal thumb.   Patient unable to tolerate [facility] test due to level of pain.  No erythema, excessive warmth or edema.  No proximal tenderness.    Skin:    General: Skin is warm.     Capillary Refill: Capillary refill takes less than 2 seconds.     Findings: No erythema or rash.  Neurological:     Mental Status: She is alert.      ED Treatments / Results  Labs (all labs ordered are listed, but only abnormal results are displayed) Labs Reviewed - No data to display  EKG None  Radiology Dg Wrist Complete Right  Result Date: 10/07/2018 CLINICAL DATA:  Some pain since Monday. EXAM: RIGHT WRIST - COMPLETE 3+ VIEW COMPARISON:  None. FINDINGS: Widened scapholunate space, and exaggerated scapholunate angle at 67 degrees. Minimal spurring of the first MCP joint. No acute bony findings. IMPRESSION: 1. Suspected torn scapholunate ligament, with widened scapholunate space and exaggerated scapholunate angle. 2. Minimal degenerative findings at the first MCP joint. Electronically Signed   By: Van Clines M.D.   On: 10/07/2018 19:00    Procedures Procedures (including critical care time)  Medications Ordered in ED Medications - No data to display   Initial Impression / Assessment and Plan / ED Course  I have reviewed the triage vital signs and the nursing notes.  Pertinent labs & imaging results that were available during my care of the patient were reviewed by me and considered in my medical decision making (see chart for details).     Patient with 2-week history of right wrist pain without known injury.  She is neurovascularly intact.  Discussed  x-ray findings with the patient.  She has a cock-up wrist splint given to her to from a previous urgent care visit.  She agrees to ice, elevate, and close orthopedic follow-up.  She states she is a patient of [facility] and prefers to arrange follow-up with them.  I have advised her that she would likely need to see a hand surgeon if one is unavailable within the group.  I have provided info for the on call hand surgeon.     10/09/18  Addendum:  On further review of patient's medical record it was of note that her serum creatinine was elevated.  Pt was prescribed a NSAID at time of d/c. Based on this finding,  I contacted her via telephone and advised her to not take the medication and she verbalized understanding and agrees to arrange f/u with her PCP regarding her kidney functions and HTN.    Final Clinical Impressions(s) / ED Diagnoses   Final  diagnoses:  Right wrist pain    ED Discharge Orders         Ordered     10/07/18 2037           Kem Parkinson, PA-C 10/09/18 0123    Katheryn Culliton, PA-C 10/09/18 1641    Noemi Chapel, MD 10/10/18 1120

## 2018-10-09 NOTE — Telephone Encounter (Signed)
Patient into the clinic for an appointment with Dr. Alvester Morin and was inquiring about her RX refill on her Oxycodone-Actaminophen.  CB#[phone].  Thank you.

## 2018-10-09 NOTE — Procedures (Signed)
Lumbosacral Transforaminal Epidural Steroid Injection - Sub-Pedicular Approach with Fluoroscopic Guidance  Patient: Marilyn [name]      Date of Birth: 07-05-1971 MRN: [id number] PCP: Joette Catching, MD      Visit Date: 10/09/2018   Universal Protocol:    Date/Time: 10/09/2018  Consent Given By: the patient  Position: PRONE  Additional Comments: Vital signs were monitored before and after the procedure. Patient was prepped and draped in the usual sterile fashion. The correct patient, procedure, and site was verified.   Injection Procedure Details:  Procedure Site One Meds Administered:  Meds ordered this encounter  Medications  . betamethasone acetate-betamethasone sodium phosphate (CELESTONE) injection 12 mg    Laterality: Right  Location/Site:  L4-L5  Needle size: 22 G  Needle type: Spinal  Needle Placement: Transforaminal  Findings:    -Comments: Excellent flow of contrast along the nerve and into the epidural space.  Procedure Details: After squaring off the end-plates to get a true AP view, the C-arm was positioned so that an oblique view of the foramen as noted above was visualized. The target area is just inferior to the "nose of the scotty dog" or sub pedicular. The soft tissues overlying this structure were infiltrated with 2-3 ml. of 1% Lidocaine without Epinephrine.  The spinal needle was inserted toward the target using a "trajectory" view along the fluoroscope beam.  Under AP and lateral visualization, the needle was advanced so it did not puncture dura and was located close the 6 O'Clock position of the pedical in AP tracterory. Biplanar projections were used to confirm position. Aspiration was confirmed to be negative for CSF and/or blood. A 1-2 ml. volume of Isovue-250 was injected and flow of contrast was noted at each level. Radiographs were obtained for documentation purposes.   After attaining the desired flow of contrast documented above, a 0.5  to 1.0 ml test dose of 0.25% Marcaine was injected into each respective transforaminal space.  The patient was observed for 90 seconds post injection.  After no sensory deficits were reported, and normal lower extremity motor function was noted,   the above injectate was administered so that equal amounts of the injectate were placed at each foramen (level) into the transforaminal epidural space.   Additional Comments:  The patient tolerated the procedure well Dressing: Band-Aid    Post-procedure details: Patient was observed during the procedure. Post-procedure instructions were reviewed.  Patient left the clinic in stable condition.

## 2018-10-09 NOTE — Telephone Encounter (Signed)
Dr.NItka has request for this

## 2018-10-10 ENCOUNTER — Telehealth (INDEPENDENT_AMBULATORY_CARE_PROVIDER_SITE_OTHER): Payer: Self-pay | Admitting: Specialist

## 2018-10-10 NOTE — Telephone Encounter (Signed)
Duplicate message---Dr. Otelia Sergeant has refill request

## 2018-10-10 NOTE — Telephone Encounter (Signed)
Patient called advised she called the pharmacy and the Rx for Oxycodone is not there yet. The number to contact patient is [phone]

## 2018-10-13 ENCOUNTER — Other Ambulatory Visit (INDEPENDENT_AMBULATORY_CARE_PROVIDER_SITE_OTHER): Payer: Self-pay | Admitting: Orthopaedic Surgery

## 2018-10-13 MED ORDER — OXYCODONE-ACETAMINOPHEN 5-325 MG PO TABS: 1.0000 | ORAL_TABLET | Freq: Three times a day (TID) | ORAL | 0 refills | Status: DC | PRN

## 2018-10-13 NOTE — Progress Notes (Signed)
Marilyn [name] - 48 y.o. female MRN [id number]  Date of birth: 08/28/1971  Office Visit Note: Visit Date: 10/09/2018 PCP: Joette Catching, MD Referred by: Joette Catching, MD  Subjective: Chief Complaint  Patient presents with  . Lower Back - Pain   HPI:  Marilyn [name] is a 48 y.o. female who comes in today For planned right L4 transforaminal epidural steroid injection or selective nerve root block.  She has had prior L4-S1 lumbar fusion.  There is some residual disc material foraminal narrowing at L4.  Patient continues to have right-sided back and hip pain.  She is failed conservative and nonconservative care otherwise.  ROS Otherwise per HPI.  Assessment & Plan: Visit Diagnoses:  1. Lumbar radiculopathy     Plan: Findings:  Diagnostically she seemed to have relief during the anesthetic phase with Marcaine.    Meds & Orders:  Meds ordered this encounter  Medications  . betamethasone acetate-betamethasone sodium phosphate (CELESTONE) injection 12 mg    Orders Placed This Encounter  Procedures  . XR C-ARM NO REPORT  . Epidural Steroid injection    Follow-up: Return if symptoms worsen or fail to improve, for Vira Browns, MD.   Procedures: No procedures performed  Lumbosacral Transforaminal Epidural Steroid Injection - Sub-Pedicular Approach with Fluoroscopic Guidance  Patient: Marilyn [name]      Date of Birth: 07-Jul-1971 MRN: [id number] PCP: Joette Catching, MD      Visit Date: 10/09/2018   Universal Protocol:    Date/Time: 10/09/2018  Consent Given By: the patient  Position: PRONE  Additional Comments: Vital signs were monitored before and after the procedure. Patient was prepped and draped in the usual sterile fashion. The correct patient, procedure, and site was verified.   Injection Procedure Details:  Procedure Site One Meds Administered:  Meds ordered this encounter  Medications  . betamethasone acetate-betamethasone sodium phosphate  (CELESTONE) injection 12 mg    Laterality: Right  Location/Site:  L4-L5  Needle size: 22 G  Needle type: Spinal  Needle Placement: Transforaminal  Findings:    -Comments: Excellent flow of contrast along the nerve and into the epidural space.  Procedure Details: After squaring off the end-plates to get a true AP view, the C-arm was positioned so that an oblique view of the foramen as noted above was visualized. The target area is just inferior to the "nose of the scotty dog" or sub pedicular. The soft tissues overlying this structure were infiltrated with 2-3 ml. of 1% Lidocaine without Epinephrine.  The spinal needle was inserted toward the target using a "trajectory" view along the fluoroscope beam.  Under AP and lateral visualization, the needle was advanced so it did not puncture dura and was located close the 6 O'Clock position of the pedical in AP tracterory. Biplanar projections were used to confirm position. Aspiration was confirmed to be negative for CSF and/or blood. A 1-2 ml. volume of Isovue-250 was injected and flow of contrast was noted at each level. Radiographs were obtained for documentation purposes.   After attaining the desired flow of contrast documented above, a 0.5 to 1.0 ml test dose of 0.25% Marcaine was injected into each respective transforaminal space.  The patient was observed for 90 seconds post injection.  After no sensory deficits were reported, and normal lower extremity motor function was noted,   the above injectate was administered so that equal amounts of the injectate were placed at each foramen (level) into the transforaminal epidural space.   Additional Comments:  The patient tolerated the procedure well Dressing: Band-Aid    Post-procedure details: Patient was observed during the procedure. Post-procedure instructions were reviewed.  Patient left the clinic in stable condition.     Clinical History: MRI LUMBAR SPINE WITHOUT  CONTRAST  TECHNIQUE: Multiplanar, multisequence MR imaging of the lumbar spine was performed. No intravenous contrast was administered.  The patient required anesthesia.  COMPARISON:  None.  FINDINGS: Segmentation:  Standard.  Alignment: Trace anterolisthesis L4-5 and L5-S1 improved postoperatively.  Vertebrae: Slight inferior L4 endplate T2 and STIR hyperintensity, favored to represent sequelae of cage subsidence, rather than early changes of discitis/osteomyelitis.  Conus medullaris and cauda equina: Conus extends to the L1 level. Conus and cauda equina appear normal.  Paraspinal and other soft tissues: No psoas inflammation or abscess. No epidural inflammation.  There is no significant postoperative fluid collection in the paravertebral soft tissues although moderate inflammation along the course of the incision is observed, an expected postoperative finding.  Disc levels:  L1-L2: Facet arthropathy. Unremarkable disc space. No impingement.  L2-L3: Facet arthropathy. Unremarkable disc space. No impingement.  L3-L4: Advanced facet arthropathy and ligamentum flavum hypertrophy. Unremarkable disc space. Borderline stenosis without compressive subarticular zone narrowing. BILATERAL foraminal zone narrowing, slightly worse on the RIGHT, could affect either L3 nerve root. See series 5, image 6  L4-L5: Status post L4-5 TLIF. Artifact related to pedicle screw and rod construct. Improved stenosis and subarticular zone narrowing postoperatively. Residual disc material and facet arthropathy narrow the RIGHT foraminal zone, potentially compressing the RIGHT L4 nerve root. See for instance series 7, image 5.  L5-S1: Status post L5-S1 TLIF. Artifact related to pedicle screw and rod construct. Improved stenosis and subarticular zone narrowing postoperatively. Residual disc material and multifactorial foraminal narrowing bilaterally could affect either L5 nerve  root.  IMPRESSION: Status post L4-S1 TLIF. No significant postoperative extra-spinal fluid collection. No features concerning for discitis/osteomyelitis.  Slight inferior L4 endplate T2 and STIR hyperintensity, favored to represent early cage subsidence. Continued surveillance is warranted.  Improved stenosis and subarticular zone narrowing at L4-5 and L5-S1. Correlate clinically for residual symptomatic RIGHT-sided foraminal narrowing at either or both levels.  BILATERAL foraminal narrowing is observed at L3-L4, without interval changes of disc protrusion/adjacent segment disease. Either L3 nerve root could be affected.  If further/subsequent lumbar spine imaging is performed, with regard to the question of infection, post infusion imaging with Stormy Fabian is strongly recommended.   Electronically Signed   By: Elsie Stain M.D.   On: 09/09/2018 10:00     Objective:  VS:  HT:    WT:   BMI:     BP:(!) 179/84  HR:75bpm  TEMP:97.6 F (36.4 C)(Oral)  RESP:  Physical Exam  Ortho Exam Imaging: No results found.

## 2018-10-13 NOTE — Telephone Encounter (Signed)
Please advise 

## 2018-10-14 ENCOUNTER — Ambulatory Visit (INDEPENDENT_AMBULATORY_CARE_PROVIDER_SITE_OTHER): Payer: Medicare Other | Admitting: Orthopaedic Surgery

## 2018-10-21 ENCOUNTER — Encounter (INDEPENDENT_AMBULATORY_CARE_PROVIDER_SITE_OTHER): Payer: Self-pay | Admitting: Orthopaedic Surgery

## 2018-10-21 ENCOUNTER — Ambulatory Visit (INDEPENDENT_AMBULATORY_CARE_PROVIDER_SITE_OTHER): Payer: Medicare Other | Admitting: Orthopaedic Surgery

## 2018-10-21 DIAGNOSIS — Z96652 Presence of left artificial knee joint: Secondary | ICD-10-CM

## 2018-10-21 DIAGNOSIS — M654 Radial styloid tenosynovitis [de Quervain]: Secondary | ICD-10-CM

## 2018-10-21 DIAGNOSIS — M65311 Trigger thumb, right thumb: Secondary | ICD-10-CM

## 2018-10-21 MED ORDER — LIDOCAINE HCL 1 % IJ SOLN
0.5000 mL | INTRAMUSCULAR | Status: AC | PRN
  Administered 2018-10-21: .5 mL

## 2018-10-21 MED ORDER — METHYLPREDNISOLONE ACETATE 40 MG/ML IJ SUSP
20.0000 mg | INTRAMUSCULAR | Status: AC | PRN
  Administered 2018-10-21: 20 mg

## 2018-10-21 NOTE — Progress Notes (Signed)
Office Visit Note   Patient: Marilyn [name]           Date of Birth: 1971-08-12           MRN: [id number] Visit Date: 10/21/2018              Requested by: Joette Catching, MD [address] [place], [place] [address] PCP: Joette Catching, MD   Assessment & Plan: Visit Diagnoses:  1. Trigger thumb, right thumb   2. De Quervain's disease (radial styloid tenosynovitis)     Plan: She will continue to wear the wrist splint with a thumb spica on the right wrist until symptoms resolve.  She needs to wear this during the day.  When she is finished with her colonoscopy is allowed to by her gastroenterologist she can go back on Aleve 2 tablets recommended twice daily with food until symptoms resolved.  In regards to the trigger thumb we will see how she does with the injection she will return it.  Symptoms persist or becomes worse.  Questions were encouraged and answered.  Follow-Up Instructions: Return if symptoms worsen or fail to improve.   Orders:  No orders of the defined types were placed in this encounter.  No orders of the defined types were placed in this encounter.     Procedures: Hand/UE Inj: R thumb A1 for trigger finger on 10/21/2018 11:23 AM Details: 25 G needle, volar approach Medications: 0.5 mL lidocaine 1 %; 20 mg methylPREDNISolone acetate 40 MG/ML Consent was given by the patient. Patient was prepped and draped in the usual sterile fashion.       Clinical Data: No additional findings.   Subjective: Chief Complaint  Patient presents with  . Left Hand - Pain  . Right Hand - Pain    HPI  Marilyn [name] comes in today Bilateral hand pain.  She states she went to urgent care and was told she had carpal tunnel syndromeThen went to another urgent care and was told she had a tendon issue involving the thumb was told to use wrist splint  with a thumb spica.  She denies any numbness tingling on her hand.  She states that her right thumb gets caught down in a  locked position and she has to manually pull it up.  She also has pain in the dorsal side of both thumbs right greater than left.  All this been going on for a few months.  She said no known injury.  She been taking occasional over-the-counter NSAID but states she has an upcoming colonoscopy and is unable to take NSAIDs a week prior to the procedure. Radiographs right wrist dated 10/07/2018 reviewed and does show widening of the scaphoid lunate space.  Some mild degenerative changes of the first metacarpal phalangeal joint.  Otherwise no acute fractures bony abnormalities. Review of Systems See HPI otherwise negative or noncontributory.  Objective: Vital Signs: There were no vitals taken for this visit.  Physical Exam Constitutional:      Appearance: She is not ill-appearing or diaphoretic.  Neurological:     Mental Status: She is alert and oriented to person, place, and time.  Psychiatric:        Mood and Affect: Mood normal.     Ortho Exam Bilateral hands full sensation.  No rashes skin lesions ulcerations or impending ulcers.  Triggering actively of the right thumb tenderness over the A1 pulley with palpable nodule.  Otherwise bilateral hands without any other triggering fingers.  Negative grind test  of the thumb bilaterally.  She has tenderness over the dorsal first extensor compartment bilaterally.  Positive Finkelstein's bilaterally.  Specialty Comments:  No specialty comments available.  Imaging: No results found.   PMFS History: Patient Active Problem List   Diagnosis Date Noted  . Anemia due to blood loss, acute 06/18/2018    Class: Acute  . Plantar fasciitis of left foot 06/16/2018    Class: Chronic  . Tendonitis, Achilles, right 06/16/2018    Class: Chronic  . Osteochondral talar dome lesion 06/16/2018    Class: Chronic  . Deep incisional surgical site infection   . Superficial incisional surgical site infection 06/11/2018    Class: Acute  . Right ankle effusion  06/11/2018    Class: Acute  . Morbid (severe) obesity due to excess calories (HCC) 05/29/2018    Class: Chronic  . Spondylolisthesis, lumbar region 05/27/2018    Class: Chronic  . Spinal stenosis of lumbar region 05/27/2018    Class: Chronic  . Urinary tract infection 05/27/2018    Class: Acute  . Fusion of spine of lumbar region 05/27/2018  . Pain of right hip joint 07/03/2017  . Acute right-sided low back pain with right-sided sciatica 03/27/2017  . Chronic right shoulder pain 02/13/2017  . History of rotator cuff tear 11/30/2016  . Impingement syndrome of right shoulder 11/30/2016  . Exacerbation of asthma 07/18/2016  . Hypokalemia 07/18/2016  . Hypertension 07/18/2016  . Depression with anxiety 07/18/2016  . Hypothyroidism 07/18/2016  . Hyperglycemia 07/18/2016  . Acute asthma exacerbation 07/18/2016  . Surgical wound dehiscence left hip; questionable superficial infection 11/10/2015  . Left hip postoperative wound infection 11/10/2015  . Osteoarthritis of left hip 09/09/2015  . 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 Obesity (BMI 35.0-39.9 without comorbidity) 04/28/2013   Past Medical History:  Diagnosis Date  . Anemia    taking iron now. pt states having no current issues 09/02/2015  . Anginal pain (HCC)    pt states experiences chest wall pain pt states related to her asthma   . Anxiety    with MRI's  . Arthritis    Everywhere  . Asthma   . Dizziness   . GERD (gastroesophageal reflux disease)   . Headache(784.0)    HX OF MIGRAINES  . History of bronchitis   . Hypertension   . Hypothyroidism    takes levothyroxen  . OSA on CPAP    wears cpap  . Shortness of breath    with exertion  . Wears glasses     Family History  Problem Relation Age of Onset  . Cancer Mother        colon  . Epilepsy Mother   . Cancer Father        prostate  . Diabetes Father   . Hypertension Father   . Hypertension Maternal Aunt   . Diabetes Maternal Aunt     . Hypertension Paternal Aunt     Past Surgical History:  Procedure Laterality Date  . CESAREAN SECTION     times 2  . CHOLECYSTECTOMY    . ENDOMETRIAL ABLATION    . INCISION AND DRAINAGE HIP Left 11/10/2015   Procedure: IRRIGATION AND DEBRIDEMENT LEFT HIP INCISION;  Surgeon: Kathryne Hitchhristopher Y Blackman, MD;  Location: [facility] OR;  Service: Orthopedics;  Laterality: Left;  . JOINT REPLACEMENT  2011   total left knee  . KNEE ARTHROPLASTY  04/23/2012   right   . KNEE ARTHROSCOPY    . LUMBAR WOUND  DEBRIDEMENT N/A 06/11/2018   Procedure: LUMBAR WOUND DEBRIDEMENT;  Surgeon: Kerrin ChampagneNitka, James E, MD;  [facility];  Service: Orthopedics;  Laterality: N/A;  . RADIOLOGY WITH ANESTHESIA N/A 09/09/2018   Procedure: LUMBER SPINE WITHOUT CONTRAST;  Surgeon: Radiologist, Medication, MD;  Location: [facility] OR;  Service: Radiology;  Laterality: N/A;  . ROTATOR CUFF REPAIR     left   . SHOULDER SURGERY     right to repair ligament tear  . TOTAL HIP ARTHROPLASTY Left 09/09/2015   Procedure: LEFT TOTAL HIP ARTHROPLASTY ANTERIOR APPROACH;  Surgeon: Kathryne Hitchhristopher Y Blackman, MD;  Location: [facility];  Service: Orthopedics;  Laterality: Left;  . TOTAL KNEE ARTHROPLASTY  04/23/2012   Procedure: TOTAL KNEE ARTHROPLASTY;  Surgeon: Nadara MustardMarcus V Duda, MD;  Location: [facility] OR;  Service: Orthopedics;  Laterality: Right;  Right Total Knee Arthroplasty  . TUBAL LIGATION  1996   Social History   Occupational History  . Not on file  Tobacco Use  . Smoking status: Former Smoker    Packs/day: 0.50    Years: 4.00    Pack years: 2.00    Last attempt to quit: 09/18/1991    Years since quitting: 27.1  . Smokeless tobacco: Never Used  Substance and Sexual Activity  . Alcohol use: No  . Drug use: No  . Sexual activity: Yes    Birth control/protection: Surgical

## 2018-10-24 ENCOUNTER — Encounter (INDEPENDENT_AMBULATORY_CARE_PROVIDER_SITE_OTHER): Payer: Self-pay | Admitting: Specialist

## 2018-10-24 ENCOUNTER — Ambulatory Visit (INDEPENDENT_AMBULATORY_CARE_PROVIDER_SITE_OTHER): Payer: Self-pay

## 2018-10-24 ENCOUNTER — Ambulatory Visit (INDEPENDENT_AMBULATORY_CARE_PROVIDER_SITE_OTHER): Payer: Medicare Other | Admitting: Specialist

## 2018-10-24 VITALS — BP 123/63 | HR 70 | Ht 63.0 in | Wt 316.0 lb

## 2018-10-24 DIAGNOSIS — M5416 Radiculopathy, lumbar region: Secondary | ICD-10-CM

## 2018-10-24 DIAGNOSIS — Z981 Arthrodesis status: Secondary | ICD-10-CM | POA: Diagnosis not present

## 2018-10-24 MED ORDER — OXYCODONE-ACETAMINOPHEN 5-325 MG PO TABS: 2.0000 | ORAL_TABLET | Freq: Three times a day (TID) | ORAL | 0 refills | Status: DC | PRN

## 2018-10-24 NOTE — Patient Instructions (Addendum)
Transforamenal ESI without relief of pain at all. Will obtain EMG/NCv to assess for ongoing radiculopathy in the L4 or L5 distribution. Avoid frequent bending and stooping  No lifting greater than 10 lbs. May use ice or moist heat for pain. Weight loss is of benefit. Best medication for lumbar disc disease is arthritis medications like motrin, celebrex and naprosyn. Exercise is important to improve your indurance and does allow people to function better inspite of back pain. When the skin is totally healed over the lumbar spine then you may return to pool exercises.

## 2018-10-24 NOTE — Progress Notes (Signed)
Office Visit Note   Patient: Marilyn [name]           Date of Birth: December 09, 1970           MRN: [id number] Visit Date: 10/24/2018              Requested by: Joette Catching, MD [address] [place], [place] [address] PCP: Joette Catching, MD   Assessment & Plan: Visit Diagnoses:  1. Status post lumbar spinal fusion   2. Right lumbar radiculitis     Plan: *Avoid frequent bending and stooping  No lifting greater than 10 lbs. May use ice or moist heat for pain. Weight loss is of benefit. Best medication for lumbar disc disease is arthritis medications like motrin, celebrex and naprosyn. Exercise is important to improve your indurance and does allow people to function better inspite of back pain. When the skin is totally healed over the lumbar spine then you may return to pool exercises   Follow-Up Instructions: No follow-ups on file.   Orders:  Orders Placed This Encounter  Procedures  . XR Lumbar Spine 2-3 Views  . Ambulatory referral to Physical Medicine Rehab   No orders of the defined types were placed in this encounter.     Procedures: No procedures performed   Clinical Data: No additional findings.   Subjective: Chief Complaint  Patient presents with  . Lower Back - Wound Check, Follow-up    48 year old female with history of lumbar decompression and fusion at L4-5 8.2019. She is having persistent pain in the right leg and radiates distally. She underwent a transforamenal ESI on right side L4 for stenosis within the foramen right L4  Transforamenal ESI without relief of pain at all. Will obtain EMG/NCv to assess for ongoing radiculopathy in the L4 or L5 distribution.  Review of Systems  Constitutional: Negative.   HENT: Negative.   Eyes: Negative.   Respiratory: Negative.   Cardiovascular: Negative.   Gastrointestinal: Negative.   Endocrine: Negative.   Genitourinary: Negative.   Musculoskeletal: Negative.   Skin: Negative.     Allergic/Immunologic: Negative.   Neurological: Negative.   Hematological: Negative.   Psychiatric/Behavioral: Negative.      Objective: Vital Signs: BP 123/63 (BP Location: Left Arm, Patient Position: Sitting)   Pulse 70   Ht 5\' 3"  (1.6 m)   Wt (!) 316 lb (143.3 kg)   BMI 55.98 kg/m   Physical Exam  Back Exam   Muscle Strength  Right Quadriceps:  5/5  Left Quadriceps:  5/5  Right Hamstrings:  5/5  Left Hamstrings:  5/5   Tests  Straight leg raise right: negative Straight leg raise left: negative  Reflexes  Patellar: normal Achilles: normal Babinski's sign: normal       Specialty Comments:  No specialty comments available.  Imaging: No results found.   PMFS History: Patient Active Problem List   Diagnosis Date Noted  . Anemia due to blood loss, acute 06/18/2018    Priority: High    Class: Acute  . Superficial incisional surgical site infection 06/11/2018    Priority: High    Class: Acute  . Right ankle effusion 06/11/2018    Priority: High    Class: Acute  . Morbid (severe) obesity due to excess calories (HCC) 05/29/2018    Priority: High    Class: Chronic  . Spondylolisthesis, lumbar region 05/27/2018    Priority: High    Class: Chronic  . Spinal stenosis of lumbar region 05/27/2018  Priority: High    Class: Chronic  . Urinary tract infection 05/27/2018    Priority: High    Class: Acute  . Plantar fasciitis of left foot 06/16/2018    Priority: Medium    Class: Chronic  . Tendonitis, Achilles, right 06/16/2018    Priority: Medium    Class: Chronic  . Osteochondral talar dome lesion 06/16/2018    Priority: Medium    Class: Chronic  . Deep incisional surgical site infection   . Fusion of spine of lumbar region 05/27/2018  . Pain of right hip joint 07/03/2017  . Acute right-sided low back pain with right-sided sciatica 03/27/2017  . Chronic right shoulder pain 02/13/2017  . History of rotator cuff tear 11/30/2016  . Impingement  syndrome of right shoulder 11/30/2016  . Exacerbation of asthma 07/18/2016  . Hypokalemia 07/18/2016  . Hypertension 07/18/2016  . Depression with anxiety 07/18/2016  . Hypothyroidism 07/18/2016  . Hyperglycemia 07/18/2016  . Acute asthma exacerbation 07/18/2016  . Surgical wound dehiscence left hip; questionable superficial infection 11/10/2015  . Left hip postoperative wound infection 11/10/2015  . Osteoarthritis of left hip 09/09/2015  . 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 Obesity (BMI 35.0-39.9 without comorbidity) 04/28/2013   Past Medical History:  Diagnosis Date  . Anemia    taking iron now. pt states having no current issues 09/02/2015  . Anginal pain (HCC)    pt states experiences chest wall pain pt states related to her asthma   . Anxiety    with MRI's  . Arthritis    Everywhere  . Asthma   . Dizziness   . GERD (gastroesophageal reflux disease)   . Headache(784.0)    HX OF MIGRAINES  . History of bronchitis   . Hypertension   . Hypothyroidism    takes levothyroxen  . OSA on CPAP    wears cpap  . Shortness of breath    with exertion  . Wears glasses     Family History  Problem Relation Age of Onset  . Cancer Mother        colon  . Epilepsy Mother   . Cancer Father        prostate  . Diabetes Father   . Hypertension Father   . Hypertension Maternal Aunt   . Diabetes Maternal Aunt   . Hypertension Paternal Aunt     Past Surgical History:  Procedure Laterality Date  . CESAREAN SECTION     times 2  . CHOLECYSTECTOMY    . ENDOMETRIAL ABLATION    . INCISION AND DRAINAGE HIP Left 11/10/2015   Procedure: IRRIGATION AND DEBRIDEMENT LEFT HIP INCISION;  Surgeon: Kathryne Hitchhristopher Y Blackman, MD;  Location: [facility] OR;  Service: Orthopedics;  Laterality: Left;  . JOINT REPLACEMENT  2011   total left knee  . KNEE ARTHROPLASTY  04/23/2012   right   . KNEE ARTHROSCOPY    . LUMBAR WOUND DEBRIDEMENT N/A 06/11/2018   Procedure: LUMBAR WOUND DEBRIDEMENT;   Surgeon: Kerrin ChampagneNitka,  E, MD;  [facility];  Service: Orthopedics;  Laterality: N/A;  . RADIOLOGY WITH ANESTHESIA N/A 09/09/2018   Procedure: LUMBER SPINE WITHOUT CONTRAST;  Surgeon: Radiologist, Medication, MD;  Location: [facility];  Service: Radiology;  Laterality: N/A;  . ROTATOR CUFF REPAIR     left   . SHOULDER SURGERY     right to repair ligament tear  . TOTAL HIP ARTHROPLASTY Left 09/09/2015   Procedure: LEFT TOTAL HIP ARTHROPLASTY ANTERIOR APPROACH;  Surgeon: Cristal Deerhristopher  Aretha Parrot, MD;  Location: [facility];  Service: Orthopedics;  Laterality: Left;  . TOTAL KNEE ARTHROPLASTY  04/23/2012   Procedure: TOTAL KNEE ARTHROPLASTY;  Surgeon: [name], MD;  Location: [facility];  Service: Orthopedics;  Laterality: Right;  Right Total Knee Arthroplasty  . TUBAL LIGATION  1996   Social History   Occupational History  . Not on file  Tobacco Use  . Smoking status: Former Smoker    Packs/day: 0.50    Years: 4.00    Pack years: 2.00    Last attempt to quit: 09/18/1991    Years since quitting: 27.1  . Smokeless tobacco: Never Used  Substance and Sexual Activity  . Alcohol use: No  . Drug use: No  . Sexual activity: Yes    Birth control/protection: Surgical

## 2018-10-31 ENCOUNTER — Encounter: Payer: Self-pay | Admitting: Infectious Diseases

## 2018-10-31 ENCOUNTER — Ambulatory Visit (INDEPENDENT_AMBULATORY_CARE_PROVIDER_SITE_OTHER): Payer: Medicare Other | Admitting: Infectious Diseases

## 2018-10-31 DIAGNOSIS — M5441 Lumbago with sciatica, right side: Secondary | ICD-10-CM

## 2018-10-31 DIAGNOSIS — T8131XD Disruption of external operation (surgical) wound, not elsewhere classified, subsequent encounter: Secondary | ICD-10-CM | POA: Diagnosis not present

## 2018-10-31 DIAGNOSIS — I10 Essential (primary) hypertension: Secondary | ICD-10-CM | POA: Diagnosis not present

## 2018-10-31 DIAGNOSIS — Z6841 Body Mass Index (BMI) 40.0 and over, adult: Secondary | ICD-10-CM

## 2018-10-31 NOTE — Assessment & Plan Note (Signed)
She is borderline today.  will f/u with PCP.

## 2018-10-31 NOTE — Assessment & Plan Note (Signed)
She has (meylogram?) pending.

## 2018-10-31 NOTE — Progress Notes (Signed)
   Subjective:    Patient ID: Marilyn [name], female    DOB: 02/15/1971, 48 y.o.   MRN: [id number]  HPI 48 y.o.fwith hx ofL4-L5 and L5 S1 TLIF lumbar fusion on 05/27/18, who returned for f/u and was found to have wound d/c. She wasadmitted and hadlumbar wound debridement 06-11-18. Her cx grew P aeruginosa (pan-sens).  She was treated with cefepime in hospital and then d/c on same on 06-19-18 with VAC in place.  She has had ongoing isssues with back pain.  She was seen in ID on 11-5 and felt to be doing well. She completed IV anbx on 11-7 and was transitioned to levaquin.  She was seen in ID f/u on 09-2018 and continued on levaquin while her wound is open.   Feels well today. Back nearly closed.  Has been seen in ED since last visit for tenosynovitis of her R thumb.   CRP 20.9,  ESR 67 (08-18-18) CRP 16.3 ESR cancelled (09-18-18)   Review of Systems  Constitutional: Negative for appetite change, chills, fever and unexpected weight change.  Gastrointestinal: Negative for constipation and diarrhea.  Genitourinary: Negative for difficulty urinating.  Musculoskeletal: Positive for arthralgias. Negative for back pain.  back feels like it is "pulling". Radicular pain R leg Please see HPI. All other systems reviewed and negative.      Objective:   Physical Exam Constitutional:      Appearance: Normal appearance. She is obese.  Cardiovascular:     Rate and Rhythm: Normal rate and regular rhythm.  Pulmonary:     Effort: Pulmonary effort is normal.     Breath sounds: Normal breath sounds.  Abdominal:     General: Bowel sounds are normal. There is no distension.     Palpations: Abdomen is soft.     Tenderness: There is no abdominal tenderness.  Neurological:     Mental Status: She is alert.    No fluctuance, no tenderness, no increase in heat.         Assessment & Plan:

## 2018-10-31 NOTE — Assessment & Plan Note (Signed)
Will have her seen by bariatrician at her request.

## 2018-10-31 NOTE — Assessment & Plan Note (Signed)
Will stop her levaquin  She is doing well Her wound is superficial. There is no surrounding evidence of infection. There is no d/c.  Will see her back prn.

## 2018-11-03 ENCOUNTER — Other Ambulatory Visit (INDEPENDENT_AMBULATORY_CARE_PROVIDER_SITE_OTHER): Payer: Self-pay | Admitting: Specialist

## 2018-11-03 NOTE — Telephone Encounter (Signed)
Sent request to Dr. Nitka 

## 2018-11-03 NOTE — Telephone Encounter (Signed)
Patient called requesting refill for Oxycodone.  Please call patient to advise.   [phone]

## 2018-11-04 NOTE — Telephone Encounter (Signed)
Called pt back and left VM telling her that a order has been put in her chart for a refill on her medication.

## 2018-11-05 ENCOUNTER — Telehealth (INDEPENDENT_AMBULATORY_CARE_PROVIDER_SITE_OTHER): Payer: Self-pay | Admitting: Specialist

## 2018-11-05 NOTE — Telephone Encounter (Signed)
The oxycodone was denied, but Hydrocodone was supposed to be sent in for her and it doesn't look like it was,  I will ask Dr. Otelia Sergeant about this tomorrow morning.

## 2018-11-05 NOTE — Telephone Encounter (Signed)
Patient called stated that hydrocodone RX was not received at the [facility] in [place].  Can you resend? Patient is there now.

## 2018-11-06 ENCOUNTER — Other Ambulatory Visit (INDEPENDENT_AMBULATORY_CARE_PROVIDER_SITE_OTHER): Payer: Self-pay | Admitting: Specialist

## 2018-11-06 MED ORDER — HYDROCODONE-ACETAMINOPHEN 7.5-325 MG PO TABS: 1.0000 | ORAL_TABLET | Freq: Three times a day (TID) | ORAL | 0 refills | Status: DC | PRN

## 2018-11-06 NOTE — Telephone Encounter (Signed)
I called and advised that the hydrocodone was sent in to her pharm

## 2018-11-07 ENCOUNTER — Encounter (INDEPENDENT_AMBULATORY_CARE_PROVIDER_SITE_OTHER): Payer: Medicare Other | Admitting: Physical Medicine and Rehabilitation

## 2018-11-12 ENCOUNTER — Ambulatory Visit (INDEPENDENT_AMBULATORY_CARE_PROVIDER_SITE_OTHER): Payer: Medicare Other | Admitting: Physician Assistant

## 2018-11-13 ENCOUNTER — Other Ambulatory Visit (INDEPENDENT_AMBULATORY_CARE_PROVIDER_SITE_OTHER): Payer: Self-pay | Admitting: Orthopaedic Surgery

## 2018-11-13 NOTE — Telephone Encounter (Signed)
Please advise 

## 2018-11-18 ENCOUNTER — Encounter (INDEPENDENT_AMBULATORY_CARE_PROVIDER_SITE_OTHER): Payer: Self-pay | Admitting: Physical Medicine and Rehabilitation

## 2018-11-18 ENCOUNTER — Ambulatory Visit (INDEPENDENT_AMBULATORY_CARE_PROVIDER_SITE_OTHER): Payer: Medicare Other | Admitting: Physical Medicine and Rehabilitation

## 2018-11-18 DIAGNOSIS — M961 Postlaminectomy syndrome, not elsewhere classified: Secondary | ICD-10-CM | POA: Diagnosis not present

## 2018-11-18 DIAGNOSIS — R202 Paresthesia of skin: Secondary | ICD-10-CM | POA: Diagnosis not present

## 2018-11-18 DIAGNOSIS — M5416 Radiculopathy, lumbar region: Secondary | ICD-10-CM | POA: Diagnosis not present

## 2018-11-18 NOTE — Progress Notes (Signed)
.  Numeric Pain Rating Scale and Functional Assessment Average Pain 10   In the last MONTH (on 0-10 scale) has pain interfered with the following?  1. General activity like being  able to carry out your everyday physical activities such as walking, climbing stairs, carrying groceries, or moving a chair?  Rating(8)    

## 2018-11-18 NOTE — Progress Notes (Signed)
Marilyn [name] - 48 y.o. female MRN [id number]  Date of birth: November 28, 1970  Office Visit Note: Visit Date: 11/18/2018 PCP: Joette Catching, MD Referred by: Joette Catching, MD  Subjective: Chief Complaint  Patient presents with  . Right Leg - Pain   HPI: Marilyn [name] is a 48 y.o. female who comes in today At the request of Dr. Ula Lingo for evaluation and management of right lower limb pain and possible electrodiagnostic study of the right lower limb to assess for L5 or S1 or L4 radiculopathy.  Per his report the patient has narrowing of the right foramen and is post lumbar TLIF L4-5 and L5-S1.  She is having persistent radicular type neurogenic pain and has not had any relief with L4 transforaminal injection.  She describes pain and spasms in the right leg from the hip to the knee and somewhat of an L5 distribution.  She reports that it does not change in position or activity.  This is been ongoing for 2 to 3 months.  She rates her pain as a 10 out of 10.  She denies any new focal weakness or other red flag complaints.  No left-sided complaints.  We are going to complete a single limb needle EMG test specifically looking for radiculopathy.  Her case is complicated by morbid obesity without history of diabetes.  Review of Systems  Constitutional: Negative for chills, fever, malaise/fatigue and weight loss.  HENT: Negative for hearing loss and sinus pain.   Eyes: Negative for blurred vision, double vision and photophobia.  Respiratory: Negative for cough and shortness of breath.   Cardiovascular: Negative for chest pain, palpitations and leg swelling.  Gastrointestinal: Negative for abdominal pain, nausea and vomiting.  Genitourinary: Negative for flank pain.  Musculoskeletal: Positive for back pain. Negative for myalgias.       Right hip and Thigh pain to the knee  Skin: Negative for itching and rash.  Neurological: Negative for tingling, tremors, focal weakness and weakness.    Endo/Heme/Allergies: Negative.   Psychiatric/Behavioral: Negative for depression.  All other systems reviewed and are negative.  Otherwise per HPI.  Assessment & Plan: Visit Diagnoses:  1. Lumbar radiculopathy   2. Post laminectomy syndrome     Plan: Impression: The above electrodiagnostic study is ABNORMAL and reveals evidence of mild chronic L5 radiculopathy on the right.    Recommendations: 1.  Follow-up with referring physician. 2.  Continue current management of symptoms.   Meds & Orders: No orders of the defined types were placed in this encounter.   Orders Placed This Encounter  Procedures  . NCV with EMG (electromyography)    Follow-up: Return for Vira Browns, MD.   Procedures: No procedures performed  EMG & NCV Findings: Needle evaluation of the right anterior tibialis and the right biceps femoris (short head) muscles showed increased insertional activity.  The right Fibularis Longus muscle showed increased insertional activity and diminished recruitment.  All remaining muscles (as indicated in the following table) showed no evidence of electrical instability.    Impression: The above electrodiagnostic study is ABNORMAL and reveals evidence of mild chronic L5 radiculopathy on the right.    Recommendations: 1.  Follow-up with referring physician. 2.  Continue current management of symptoms.  ___________________________ Elease Hashimoto Board Certified, American Board of Physical Medicine and Rehabilitation    EMG   Side Muscle Nerve Root Ins Act Fibs Psw Amp Dur Poly Recrt Int Dennie Bible Comment  Right AntTibialis Dp Br Peron L4-5 *Incr Nml  Nml Nml Nml 0 Nml Nml   Right Fibularis Longus  Sup Br Peron L5-S1 *Incr Nml Nml Nml Nml 0 *Reduced Nml   Right MedGastroc Tibial S1-2 Nml Nml Nml Nml Nml 0 Nml Nml   Right VastusMed Femoral L2-4 Nml Nml Nml Nml Nml 0 Nml Nml   Right BicepsFemS Sciatic L5-S1 *Incr Nml Nml Nml Nml 0 Nml Nml          Clinical  History: MRI LUMBAR SPINE WITHOUT CONTRAST  TECHNIQUE: Multiplanar, multisequence MR imaging of the lumbar spine was performed. No intravenous contrast was administered.  The patient required anesthesia.  COMPARISON:  None.  FINDINGS: Segmentation:  Standard.  Alignment: Trace anterolisthesis L4-5 and L5-S1 improved postoperatively.  Vertebrae: Slight inferior L4 endplate T2 and STIR hyperintensity, favored to represent sequelae of cage subsidence, rather than early changes of discitis/osteomyelitis.  Conus medullaris and cauda equina: Conus extends to the L1 level. Conus and cauda equina appear normal.  Paraspinal and other soft tissues: No psoas inflammation or abscess. No epidural inflammation.  There is no significant postoperative fluid collection in the paravertebral soft tissues although moderate inflammation along the course of the incision is observed, an expected postoperative finding.  Disc levels:  L1-L2: Facet arthropathy. Unremarkable disc space. No impingement.  L2-L3: Facet arthropathy. Unremarkable disc space. No impingement.  L3-L4: Advanced facet arthropathy and ligamentum flavum hypertrophy. Unremarkable disc space. Borderline stenosis without compressive subarticular zone narrowing. BILATERAL foraminal zone narrowing, slightly worse on the RIGHT, could affect either L3 nerve root. See series 5, image 6  L4-L5: Status post L4-5 TLIF. Artifact related to pedicle screw and rod construct. Improved stenosis and subarticular zone narrowing postoperatively. Residual disc material and facet arthropathy narrow the RIGHT foraminal zone, potentially compressing the RIGHT L4 nerve root. See for instance series 7, image 5.  L5-S1: Status post L5-S1 TLIF. Artifact related to pedicle screw and rod construct. Improved stenosis and subarticular zone narrowing postoperatively. Residual disc material and multifactorial foraminal narrowing  bilaterally could affect either L5 nerve root.  IMPRESSION: Status post L4-S1 TLIF. No significant postoperative extra-spinal fluid collection. No features concerning for discitis/osteomyelitis.  Slight inferior L4 endplate T2 and STIR hyperintensity, favored to represent early cage subsidence. Continued surveillance is warranted.  Improved stenosis and subarticular zone narrowing at L4-5 and L5-S1. Correlate clinically for residual symptomatic RIGHT-sided foraminal narrowing at either or both levels.  BILATERAL foraminal narrowing is observed at L3-L4, without interval changes of disc protrusion/adjacent segment disease. Either L3 nerve root could be affected.  If further/subsequent lumbar spine imaging is performed, with regard to the question of infection, post infusion imaging with Stormy Fabian is strongly recommended.   Electronically Signed   By: Elsie Stain M.D.   On: 09/09/2018 10:00   She reports that she quit smoking about 27 years ago. She has a 2.00 pack-year smoking history. She has never used smokeless tobacco.  Recent Labs    06/16/18 0725  LABURIC 8.7*    Objective:  VS:  HT:    WT:   BMI:     BP:   HR: bpm  TEMP: ( )  RESP:  Physical Exam Vitals signs and nursing note reviewed.  Constitutional:      General: She is not in acute distress.    Appearance: Normal appearance. She is well-developed. She is obese. She is not ill-appearing.  HENT:     Head: Normocephalic and atraumatic.  Eyes:     Conjunctiva/sclera: Conjunctivae normal.     Pupils: Pupils  are equal, round, and reactive to light.  Cardiovascular:     Rate and Rhythm: Normal rate.     Pulses: Normal pulses.  Pulmonary:     Effort: Pulmonary effort is normal.  Musculoskeletal:     Right lower leg: No edema.     Left lower leg: No edema.     Comments: Examination of both lower limbs shows increase bulk with no atrophy of the foot intrinsic muscles.  There is no allodynia or color  change or edema.  She has well-healed surgical scar over the right knee.  She has good strength bilaterally without any deficit of EHL or dorsiflexion or plantarflexion.  She has no clonus.  Skin:    General: Skin is warm and dry.     Findings: No erythema or rash.  Neurological:     General: No focal deficit present.     Mental Status: She is alert and oriented to person, place, and time.     Sensory: No sensory deficit.     Motor: No abnormal muscle tone.     Coordination: Coordination normal.     Gait: Gait normal.  Psychiatric:        Mood and Affect: Mood normal.        Behavior: Behavior normal.     Ortho Exam Imaging: US Carotid Bilateral  Result Date: 11/24/2018 CLINICAL DATA:  Left-sided carotid bruit. History obesity and hypertension. Former smoker. EXAM: BILATERAL CAROTID DUPLEX ULTRASOUND TECHNIQUE: Wallace Cullens scale imaging, color Doppler and duplex ultrasound were performed of bilateral carotid and vertebral arteries in the neck. COMPARISON:  None. FINDINGS: Examination is degraded due to patient body habitus and poor sonographic window. Criteria: Quantification of carotid stenosis is based on velocity parameters that correlate the residual internal carotid diameter with NASCET-based stenosis levels, using the diameter of the distal internal carotid lumen as the denominator for stenosis measurement. The following velocity measurements were obtained: RIGHT ICA: 94/30 cm/sec CCA: 91/15 cm/sec SYSTOLIC ICA/CCA RATIO:  1.0 ECA: 80 cm/sec LEFT ICA: 87/35 cm/sec CCA: 71/30 cm/sec SYSTOLIC ICA/CCA RATIO:  1.2 ECA: 35 cm/sec RIGHT CAROTID ARTERY: There is a minimal amount of intimal thickening involving the right carotid bulb. There is a minimal amount of eccentric mixed echogenic plaque involving the origin and proximal aspects of the right internal carotid artery (image 25), not resulting in elevated peak systolic velocities within the interrogated course the right internal carotid artery to  suggest a hemodynamically significant stenosis RIGHT VERTEBRAL ARTERY:  Antegrade flow LEFT CAROTID ARTERY: There is a minimal amount of intimal thickening/atherosclerotic plaque involving the origin and proximal aspects of the left internal carotid artery (image 59), not resulting in elevated peak systolic velocities within the interrogated course of the left internal carotid artery to suggest a hemodynamically significant stenosis. LEFT VERTEBRAL ARTERY:  Antegrade Flow IMPRESSION: Minimal amount of bilateral intimal thickening/atherosclerotic plaque, not resulting in a hemodynamically significant stenosis within either internal carotid artery. Electronically Signed   By: Simonne Come M.D.   On: 11/24/2018 13:25    Past Medical/Family/Surgical/Social History: Medications & Allergies reviewed per EMR, new medications updated. Patient Active Problem List   Diagnosis Date Noted  . Anemia due to blood loss, acute 06/18/2018    Class: Acute  . Plantar fasciitis of left foot 06/16/2018    Class: Chronic  . Tendonitis, Achilles, right 06/16/2018    Class: Chronic  . Osteochondral talar dome lesion 06/16/2018    Class: Chronic  . Deep incisional surgical site infection   .  Superficial incisional surgical site infection 06/11/2018    Class: Acute  . Right ankle effusion 06/11/2018    Class: Acute  . Morbid (severe) obesity due to excess calories (HCC) 05/29/2018    Class: Chronic  . Spondylolisthesis, lumbar region 05/27/2018    Class: Chronic  . Spinal stenosis of lumbar region 05/27/2018    Class: Chronic  . Urinary tract infection 05/27/2018    Class: Acute  . Fusion of spine of lumbar region 05/27/2018  . Pain of right hip joint 07/03/2017  . Acute right-sided low back pain with right-sided sciatica 03/27/2017  . Chronic right shoulder pain 02/13/2017  . History of rotator cuff tear 11/30/2016  . Impingement syndrome of right shoulder 11/30/2016  . Exacerbation of asthma 07/18/2016  .  Hypokalemia 07/18/2016  . Hypertension 07/18/2016  . Depression with anxiety 07/18/2016  . Hypothyroidism 07/18/2016  . Hyperglycemia 07/18/2016  . Acute asthma exacerbation 07/18/2016  . Surgical wound dehiscence left hip; questionable superficial infection 11/10/2015  . Left hip postoperative wound infection 11/10/2015  . Osteoarthritis of left hip 09/09/2015  . 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 Obesity (BMI 35.0-39.9 without comorbidity) 04/28/2013   Past Medical History:  Diagnosis Date  . Anemia    taking iron now. pt states having no current issues 09/02/2015  . Anginal pain (HCC)    pt states experiences chest wall pain pt states related to her asthma   . Anxiety    with MRI's  . Arthritis    Everywhere  . Asthma   . Dizziness   . GERD (gastroesophageal reflux disease)   . Headache(784.0)    HX OF MIGRAINES  . History of bronchitis   . Hypertension   . Hypothyroidism    takes levothyroxen  . OSA on CPAP    wears cpap  . Shortness of breath    with exertion  . Wears glasses    Family History  Problem Relation Age of Onset  . Cancer Mother        colon  . Epilepsy Mother   . Cancer Father        prostate  . Diabetes Father   . Hypertension Father   . Hypertension Maternal Aunt   . Diabetes Maternal Aunt   . Hypertension Paternal Aunt    Past Surgical History:  Procedure Laterality Date  . CESAREAN SECTION     times 2  . CHOLECYSTECTOMY    . ENDOMETRIAL ABLATION    . INCISION AND DRAINAGE HIP Left 11/10/2015   Procedure: IRRIGATION AND DEBRIDEMENT LEFT HIP INCISION;  Surgeon: Kathryne Hitch, MD;  Location: [facility] OR;  Service: Orthopedics;  Laterality: Left;  . JOINT REPLACEMENT  2011   total left knee  . KNEE ARTHROPLASTY  04/23/2012   right   . KNEE ARTHROSCOPY    . LUMBAR WOUND DEBRIDEMENT N/A 06/11/2018   Procedure: LUMBAR WOUND DEBRIDEMENT;  Surgeon: Kerrin Champagne, MD;  Location: [facility];  Service: Orthopedics;   Laterality: N/A;  . RADIOLOGY WITH ANESTHESIA N/A 09/09/2018   Procedure: LUMBER SPINE WITHOUT CONTRAST;  Surgeon: Radiologist, Medication, MD;  Location: [facility] OR;  Service: Radiology;  Laterality: N/A;  . ROTATOR CUFF REPAIR     left   . SHOULDER SURGERY     right to repair ligament tear  . TOTAL HIP ARTHROPLASTY Left 09/09/2015   Procedure: LEFT TOTAL HIP ARTHROPLASTY ANTERIOR APPROACH;  Surgeon: Kathryne Hitch, MD;  Location: [facility] ORS;  Service: Orthopedics;  Laterality: Left;  . TOTAL KNEE ARTHROPLASTY  04/23/2012   Procedure: TOTAL KNEE ARTHROPLASTY;  Surgeon: [name], MD;  Location: [facility] OR;  Service: Orthopedics;  Laterality: Right;  Right Total Knee Arthroplasty  . TUBAL LIGATION  1996   Social History   Occupational History  . Not on file  Tobacco Use  . Smoking status: Former Smoker    Packs/day: 0.50    Years: 4.00    Pack years: 2.00    Last attempt to quit: 09/18/1991    Years since quitting: 27.2  . Smokeless tobacco: Never Used  Substance and Sexual Activity  . Alcohol use: No  . Drug use: No  . Sexual activity: Yes    Birth control/protection: Surgical

## 2018-11-19 ENCOUNTER — Ambulatory Visit (INDEPENDENT_AMBULATORY_CARE_PROVIDER_SITE_OTHER): Payer: Medicare Other | Admitting: Physician Assistant

## 2018-11-19 ENCOUNTER — Encounter (INDEPENDENT_AMBULATORY_CARE_PROVIDER_SITE_OTHER): Payer: Self-pay | Admitting: Physician Assistant

## 2018-11-19 VITALS — Ht 63.0 in | Wt 330.0 lb

## 2018-11-19 DIAGNOSIS — M654 Radial styloid tenosynovitis [de Quervain]: Secondary | ICD-10-CM

## 2018-11-19 MED ORDER — LIDOCAINE HCL 1 % IJ SOLN
3.0000 mL | INTRAMUSCULAR | Status: AC | PRN
  Administered 2018-11-19: 3 mL

## 2018-11-19 MED ORDER — METHYLPREDNISOLONE ACETATE 40 MG/ML IJ SUSP
40.0000 mg | INTRAMUSCULAR | Status: AC | PRN
  Administered 2018-11-19: 40 mg

## 2018-11-19 NOTE — Progress Notes (Signed)
   Procedure Note  Patient: Marilyn [name]             Date of Birth: 1971-07-10           MRN: [id number]             Visit Date: 11/19/2018  HPI: Marilyn [name] comes in today due to worsening pain in her right thumb.  She is no longer having triggering pains on the dorsal aspect of her thumb.  She wore the brace with a thumb spica for a while but this caused increased pain in her thumb and she got another type pullover sleeve and this helps some.  She has been taking ibuprofen without any relief.  Physical exam: Right hand non-triggering thumb.  She has pop positive Finkelstein's.  She has tenderness over the first extensor compartment right hand.  Procedures: Visit Diagnoses: Suzette Battiest disease (radial styloid tenosynovitis)  Hand/UE Inj: R extensor compartment 1 for de Quervain's tenosynovitis on 11/19/2018 3:29 PM Indications: pain Details: 25 G needle, dorsal approach Medications: 3 mL lidocaine 1 %; 40 mg methylPREDNISolone acetate 40 MG/ML Consent was given by the patient. Immediately prior to procedure a time out was called to verify the correct patient, procedure, equipment, support staff and site/side marked as required. Patient was prepped and draped in the usual sterile fashion.    Plan: She will follow-up with [place] if the injection does not resolve her right first extensor compartment pain.  Questions encouraged and answered at length.  She tolerated the injection well.

## 2018-11-20 ENCOUNTER — Other Ambulatory Visit (INDEPENDENT_AMBULATORY_CARE_PROVIDER_SITE_OTHER): Payer: Self-pay | Admitting: Physical Medicine and Rehabilitation

## 2018-11-24 ENCOUNTER — Other Ambulatory Visit (HOSPITAL_COMMUNITY): Payer: Self-pay | Admitting: Adult Health Nurse Practitioner

## 2018-11-24 ENCOUNTER — Ambulatory Visit (HOSPITAL_COMMUNITY)
Admission: RE | Admit: 2018-11-24 | Discharge: 2018-11-24 | Disposition: A | Discharge status: Home / Self Care | Payer: Medicare Other | Source: Ambulatory Visit | Attending: Adult Health Nurse Practitioner | Admitting: Adult Health Nurse Practitioner

## 2018-11-24 DIAGNOSIS — R0989 Other specified symptoms and signs involving the circulatory and respiratory systems: Secondary | ICD-10-CM | POA: Insufficient documentation

## 2018-11-25 NOTE — Procedures (Signed)
EMG & NCV Findings: Needle evaluation of the right anterior tibialis and the right biceps femoris (short head) muscles showed increased insertional activity.  The right Fibularis Longus muscle showed increased insertional activity and diminished recruitment.  All remaining muscles (as indicated in the following table) showed no evidence of electrical instability.    Impression: The above electrodiagnostic study is ABNORMAL and reveals evidence of mild chronic L5 radiculopathy on the right.    Recommendations: 1.  Follow-up with referring physician. 2.  Continue current management of symptoms.  ___________________________ Naaman Plummer FAAPMR Board Certified, American Board of Physical Medicine and Rehabilitation    EMG   Side Muscle Nerve Root Ins Act Fibs Psw Amp Dur Poly Recrt Int Dennie Bible Comment  Right AntTibialis Dp Br Peron L4-5 *Incr Nml Nml Nml Nml 0 Nml Nml   Right Fibularis Longus  Sup Br Peron L5-S1 *Incr Nml Nml Nml Nml 0 *Reduced Nml   Right MedGastroc Tibial S1-2 Nml Nml Nml Nml Nml 0 Nml Nml   Right VastusMed Femoral L2-4 Nml Nml Nml Nml Nml 0 Nml Nml   Right BicepsFemS Sciatic L5-S1 *Incr Nml Nml Nml Nml 0 Nml Nml

## 2018-11-26 ENCOUNTER — Ambulatory Visit (INDEPENDENT_AMBULATORY_CARE_PROVIDER_SITE_OTHER): Payer: Medicare Other | Admitting: Specialist

## 2018-11-26 ENCOUNTER — Encounter (INDEPENDENT_AMBULATORY_CARE_PROVIDER_SITE_OTHER): Payer: Self-pay | Admitting: Specialist

## 2018-11-26 ENCOUNTER — Other Ambulatory Visit: Payer: Self-pay

## 2018-11-26 VITALS — BP 141/67 | HR 71 | Ht 63.0 in | Wt 316.0 lb

## 2018-11-26 DIAGNOSIS — M5416 Radiculopathy, lumbar region: Secondary | ICD-10-CM

## 2018-11-26 DIAGNOSIS — M4326 Fusion of spine, lumbar region: Secondary | ICD-10-CM | POA: Diagnosis not present

## 2018-11-26 DIAGNOSIS — Z79891 Long term (current) use of opiate analgesic: Secondary | ICD-10-CM

## 2018-11-26 DIAGNOSIS — M4726 Other spondylosis with radiculopathy, lumbar region: Secondary | ICD-10-CM

## 2018-11-26 MED ORDER — HYDROCODONE-ACETAMINOPHEN 7.5-325 MG PO TABS: 1.0000 | ORAL_TABLET | Freq: Three times a day (TID) | ORAL | 0 refills | Status: DC | PRN

## 2018-11-26 NOTE — Patient Instructions (Signed)
Plan: Avoid bending, stooping and avoid lifting weights greater than 10 lbs. Avoid prolong standing and walking. Avoid frequent bending and stooping  No lifting greater than 10 lbs. May use ice or moist heat for pain. Weight loss is of benefit. Handicap license is approved. Dr. Newton's secretary/Assistant will call to arrange for epidural steroid injection  

## 2018-11-26 NOTE — Progress Notes (Signed)
Office Visit Note   Patient: Marilyn [name]           Date of Birth: Jan 26, 1971           MRN: [id number] Visit Date: 11/26/2018              Requested by: Joette CatchingNyland, Leonard, MD [address] [place], [place] [id number] PCP: Joette CatchingNyland, Leonard, MD   Assessment & Plan: Visit Diagnoses:  1. Lumbar radiculopathy   2. Fusion of lumbar spine     Plan: Avoid bending, stooping and avoid lifting weights greater than 10 lbs. Avoid prolong standing and walking. Avoid frequent bending and stooping  No lifting greater than 10 lbs. May use ice or moist heat for pain. Weight loss is of benefit. Handicap license is approved. Dr. Knob Noster BlasNewton's secretary/Assistant will call to arrange for epidural steroid injection   Follow-Up Instructions: Return in about 4 weeks (around 12/24/2018).   Orders:  Orders Placed This Encounter  Procedures  . Ambulatory referral to Physical Medicine Rehab   Meds ordered this encounter  Medications  . HYDROcodone-acetaminophen (NORCO) 7.5-325 MG tablet    Sig: Take 1 tablet by mouth 3 (three) times daily as needed for up to 21 days for moderate pain.    Dispense:  30 tablet    Refill:  0      Procedures: No procedures performed   Clinical Data: No additional findings.   Subjective: Chief Complaint  Patient presents with  . Lower Back - Follow-up    EMG Review for Right leg-    48 year old female post L4-5 and L5-S1 TLIFs for spondylolisthesis and spinal stenosis. She has had incision dehiscense and infection of the surgical incision site and has completed use of VAC and local dressings with IV antibiotics and her lab suggests that the infection is no longer a concern. She is on no IV or po antibiotics. The incision scar is nearly completely healed with only small area of 1/4" of thin granulation that appears clean. No bowel or bladder difficulty. Has had persistent right leg pain post op and ESIs at L4 have not relieved the pain. A EMG/NCV of the right  leg was done and she returns for review of the study and discussion of options for further treatment.    Review of Systems  Constitutional: Negative.   HENT: Negative.   Eyes: Negative.   Respiratory: Negative.   Cardiovascular: Negative.   Gastrointestinal: Negative.   Endocrine: Negative.   Genitourinary: Negative.   Musculoskeletal: Negative.   Skin: Negative.   Allergic/Immunologic: Negative.   Neurological: Negative.   Hematological: Negative.   Psychiatric/Behavioral: Negative.      Objective: Vital Signs: BP (!) 141/67 (BP Location: Left Arm, Patient Position: Sitting)   Pulse 71   Ht 5\' 3"  (1.6 m)   Wt (!) 316 lb (143.3 kg)   BMI 55.98 kg/m   Physical Exam Constitutional:      Appearance: She is well-developed.  HENT:     Head: Normocephalic and atraumatic.  Eyes:     Pupils: Pupils are equal, round, and reactive to light.  Neck:     Musculoskeletal: Normal range of motion and neck supple.  Pulmonary:     Effort: Pulmonary effort is normal.     Breath sounds: Normal breath sounds.  Abdominal:     General: Bowel sounds are normal.     Palpations: Abdomen is soft.  Skin:    General: Skin is warm and dry.  Neurological:  Mental Status: She is alert and oriented to person, place, and time.  Psychiatric:        Behavior: Behavior normal.        Thought Content: Thought content normal.        Judgment: Judgment normal.     Back Exam   Tenderness  The patient is experiencing tenderness in the lumbar.  Range of Motion  Extension: abnormal  Flexion: abnormal  Lateral bend right: abnormal  Lateral bend left: abnormal  Rotation right: abnormal  Rotation left: abnormal   Muscle Strength  Right Quadriceps:  5/5  Left Quadriceps:  5/5  Right Hamstrings:  5/5  Left Hamstrings:  5/5   Tests  Straight leg raise right: negative Straight leg raise left: negative      Specialty Comments:  No specialty comments available.  Imaging: No  results found.   PMFS History: Patient Active Problem List   Diagnosis Date Noted  . Anemia due to blood loss, acute 06/18/2018    Priority: High    Class: Acute  . Superficial incisional surgical site infection 06/11/2018    Priority: High    Class: Acute  . Right ankle effusion 06/11/2018    Priority: High    Class: Acute  . Morbid (severe) obesity due to excess calories (HCC) 05/29/2018    Priority: High    Class: Chronic  . Spondylolisthesis, lumbar region 05/27/2018    Priority: High    Class: Chronic  . Spinal stenosis of lumbar region 05/27/2018    Priority: High    Class: Chronic  . Urinary tract infection 05/27/2018    Priority: High    Class: Acute  . Plantar fasciitis of left foot 06/16/2018    Priority: Medium    Class: Chronic  . Tendonitis, Achilles, right 06/16/2018    Priority: Medium    Class: Chronic  . Osteochondral talar dome lesion 06/16/2018    Priority: Medium    Class: Chronic  . Deep incisional surgical site infection   . Fusion of spine of lumbar region 05/27/2018  . Pain of right hip joint 07/03/2017  . Acute right-sided low back pain with right-sided sciatica 03/27/2017  . Chronic right shoulder pain 02/13/2017  . History of rotator cuff tear 11/30/2016  . Impingement syndrome of right shoulder 11/30/2016  . Exacerbation of asthma 07/18/2016  . Hypokalemia 07/18/2016  . Hypertension 07/18/2016  . Depression with anxiety 07/18/2016  . Hypothyroidism 07/18/2016  . Hyperglycemia 07/18/2016  . Acute asthma exacerbation 07/18/2016  . Surgical wound dehiscence left hip; questionable superficial infection 11/10/2015  . Left hip postoperative wound infection 11/10/2015  . Osteoarthritis of left hip 09/09/2015  . 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 Obesity (BMI 35.0-39.9 without comorbidity) 04/28/2013   Past Medical History:  Diagnosis Date  . Anemia    taking iron now. pt states having no current issues 09/02/2015  .  Anginal pain (HCC)    pt states experiences chest wall pain pt states related to her asthma   . Anxiety    with MRI's  . Arthritis    Everywhere  . Asthma   . Dizziness   . GERD (gastroesophageal reflux disease)   . Headache(784.0)    HX OF MIGRAINES  . History of bronchitis   . Hypertension   . Hypothyroidism    takes levothyroxen  . OSA on CPAP    wears cpap  . Shortness of breath    with exertion  . Wears glasses  Family History  Problem Relation Age of Onset  . Cancer Mother        colon  . Epilepsy Mother   . Cancer Father        prostate  . Diabetes Father   . Hypertension Father   . Hypertension Maternal Aunt   . Diabetes Maternal Aunt   . Hypertension Paternal Aunt     Past Surgical History:  Procedure Laterality Date  . CESAREAN SECTION     times 2  . CHOLECYSTECTOMY    . ENDOMETRIAL ABLATION    . INCISION AND DRAINAGE HIP Left 11/10/2015   Procedure: IRRIGATION AND DEBRIDEMENT LEFT HIP INCISION;  Surgeon: Kathryne Hitch, MD;  Location: [facility];  Service: Orthopedics;  Laterality: Left;  . JOINT REPLACEMENT  2011   total left knee  . KNEE ARTHROPLASTY  04/23/2012   right   . KNEE ARTHROSCOPY    . LUMBAR WOUND DEBRIDEMENT N/A 06/11/2018   Procedure: LUMBAR WOUND DEBRIDEMENT;  Surgeon: Kerrin Champagne, MD;  Location: [facility] OR;  Service: Orthopedics;  Laterality: N/A;  . RADIOLOGY WITH ANESTHESIA N/A 09/09/2018   Procedure: LUMBER SPINE WITHOUT CONTRAST;  Surgeon: Radiologist, Medication, MD;  Location: [facility] OR;  Service: Radiology;  Laterality: N/A;  . ROTATOR CUFF REPAIR     left   . SHOULDER SURGERY     right to repair ligament tear  . TOTAL HIP ARTHROPLASTY Left 09/09/2015   Procedure: LEFT TOTAL HIP ARTHROPLASTY ANTERIOR APPROACH;  Surgeon: Kathryne Hitch, MD;  Location: [facility];  Service: Orthopedics;  Laterality: Left;  . TOTAL KNEE ARTHROPLASTY  04/23/2012   Procedure: TOTAL KNEE ARTHROPLASTY;  Surgeon: [name], MD;  Location: [facility] OR;   Service: Orthopedics;  Laterality: Right;  Right Total Knee Arthroplasty  . TUBAL LIGATION  1996   Social History   Occupational History  . Not on file  Tobacco Use  . Smoking status: Former Smoker    Packs/day: 0.50    Years: 4.00    Pack years: 2.00    Last attempt to quit: 09/18/1991    Years since quitting: 27.2  . Smokeless tobacco: Never Used  Substance and Sexual Activity  . Alcohol use: No  . Drug use: No  . Sexual activity: Yes    Birth control/protection: Surgical

## 2018-11-27 ENCOUNTER — Other Ambulatory Visit (INDEPENDENT_AMBULATORY_CARE_PROVIDER_SITE_OTHER): Payer: Self-pay | Admitting: Specialist

## 2018-11-27 NOTE — Telephone Encounter (Signed)
Gabapentin refill request 

## 2018-12-12 ENCOUNTER — Other Ambulatory Visit (INDEPENDENT_AMBULATORY_CARE_PROVIDER_SITE_OTHER): Payer: Self-pay | Admitting: Specialist

## 2018-12-12 NOTE — Telephone Encounter (Signed)
Patient called requesting a refill on her Hydrocodone.  She is completely out of her medication.  CB#[phone].  Thank you.

## 2018-12-12 NOTE — Telephone Encounter (Signed)
Sent request to Dr. Nitka 

## 2018-12-14 ENCOUNTER — Other Ambulatory Visit (INDEPENDENT_AMBULATORY_CARE_PROVIDER_SITE_OTHER): Payer: Self-pay | Admitting: Orthopaedic Surgery

## 2018-12-15 ENCOUNTER — Telehealth (INDEPENDENT_AMBULATORY_CARE_PROVIDER_SITE_OTHER): Payer: Self-pay | Admitting: Specialist

## 2018-12-15 ENCOUNTER — Encounter (INDEPENDENT_AMBULATORY_CARE_PROVIDER_SITE_OTHER): Payer: Medicare Other | Admitting: Physical Medicine and Rehabilitation

## 2018-12-15 MED ORDER — HYDROCODONE-ACETAMINOPHEN 7.5-325 MG PO TABS: 1.0000 | ORAL_TABLET | Freq: Three times a day (TID) | ORAL | 0 refills | Status: AC | PRN

## 2018-12-15 NOTE — Telephone Encounter (Signed)
Patient called needing Rx refilled (Hydrocodone) The number to contact patient is [phone]

## 2018-12-15 NOTE — Telephone Encounter (Signed)
Please advise 

## 2018-12-15 NOTE — Telephone Encounter (Signed)
I called and lmom that the meds were sent in this morning

## 2018-12-16 ENCOUNTER — Other Ambulatory Visit (INDEPENDENT_AMBULATORY_CARE_PROVIDER_SITE_OTHER): Payer: Self-pay | Admitting: Orthopaedic Surgery

## 2018-12-16 NOTE — Telephone Encounter (Signed)
Please advise 

## 2018-12-25 ENCOUNTER — Ambulatory Visit (INDEPENDENT_AMBULATORY_CARE_PROVIDER_SITE_OTHER): Payer: Medicare Other | Admitting: Specialist

## 2018-12-31 ENCOUNTER — Encounter (INDEPENDENT_AMBULATORY_CARE_PROVIDER_SITE_OTHER): Payer: Medicare Other | Admitting: Physical Medicine and Rehabilitation

## 2019-01-02 ENCOUNTER — Ambulatory Visit (INDEPENDENT_AMBULATORY_CARE_PROVIDER_SITE_OTHER): Payer: Medicare Other | Admitting: Specialist

## 2019-01-06 ENCOUNTER — Telehealth (INDEPENDENT_AMBULATORY_CARE_PROVIDER_SITE_OTHER): Payer: Self-pay | Admitting: Specialist

## 2019-01-06 ENCOUNTER — Other Ambulatory Visit (INDEPENDENT_AMBULATORY_CARE_PROVIDER_SITE_OTHER): Payer: Self-pay | Admitting: Specialist

## 2019-01-06 NOTE — Telephone Encounter (Signed)
Allopurinol refill request  

## 2019-01-06 NOTE — Telephone Encounter (Signed)
Patient called stating that she is having a knot on both sides of her incision that come and goes.  She would like someone to call her.  CB#[phone].  Thank you.

## 2019-01-07 NOTE — Telephone Encounter (Signed)
Coming in to have them checked 01/08/19 @ 945

## 2019-01-08 ENCOUNTER — Other Ambulatory Visit: Payer: Self-pay | Admitting: Radiology

## 2019-01-08 ENCOUNTER — Inpatient Hospital Stay (INDEPENDENT_AMBULATORY_CARE_PROVIDER_SITE_OTHER): Payer: Self-pay

## 2019-01-08 ENCOUNTER — Other Ambulatory Visit (INDEPENDENT_AMBULATORY_CARE_PROVIDER_SITE_OTHER): Payer: Self-pay | Admitting: Surgery

## 2019-01-08 ENCOUNTER — Other Ambulatory Visit (HOSPITAL_COMMUNITY): Payer: Self-pay | Admitting: Specialist

## 2019-01-08 ENCOUNTER — Encounter (INDEPENDENT_AMBULATORY_CARE_PROVIDER_SITE_OTHER): Payer: Self-pay | Admitting: Specialist

## 2019-01-08 ENCOUNTER — Other Ambulatory Visit: Payer: Self-pay

## 2019-01-08 ENCOUNTER — Ambulatory Visit (INDEPENDENT_AMBULATORY_CARE_PROVIDER_SITE_OTHER): Payer: Medicare Other | Admitting: Specialist

## 2019-01-08 VITALS — BP 121/62 | HR 80 | Ht 63.0 in | Wt 335.0 lb

## 2019-01-08 DIAGNOSIS — G8929 Other chronic pain: Secondary | ICD-10-CM | POA: Diagnosis not present

## 2019-01-08 DIAGNOSIS — M545 Low back pain, unspecified: Secondary | ICD-10-CM

## 2019-01-08 NOTE — Progress Notes (Signed)
Office Visit Note   Patient: Marilyn [name]           Date of Birth: May 30, 1971           MRN: [id number] Visit Date: 01/08/2019              Requested by: [name], MD [address] [place], [place] [address] PCP: [name], MD   Assessment & Plan: Visit Diagnoses:  1. Chronic bilateral low back pain, unspecified whether sciatica present   Swelling around surgical incision  Plan: With patient's history of postop lumbar infection and wound dehiscence we will schedule patient for ultrasound of the area in question today and possible aspiration.  If radiologist is able to get fluid this will be sent for cell count with differential, Gram stain, cultures and crystal analysis.  Blood drawn in clinic today to check CBC with differential, Cmet, sed rate, CRP and uric acid. Will await callback report from the ultrasound.  Follow-Up Instructions: Return in about 2 weeks (around 01/22/2019).   Orders:  Orders Placed This Encounter  Procedures  . US Guided Needle Placement  . Sed Rate (ESR)  . CBC With Differential  . C-reactive protein  . Comprehensive Metabolic Panel (CMET)  . Uric acid   No orders of the defined types were placed in this encounter.     Procedures: No procedures performed   Clinical Data: No additional findings.   Subjective: Chief Complaint  Patient presents with  . Lower Back - Follow-up    Knots that swell along the incision    HPI 48 year old female returns.  Status post L4-5 and L5-S1 fusion September 8828 which was complicated by postop infection and wound dehiscence requiring I&D and wound VAC placement.  States that over the last couple months has had increased swelling right greater left side of her incision.  This area is painful.  Denies fever chills.  States that throughout the day area gets more painful.  Left side has been swelling  more.  Preop leg symptoms improved. Review of Systems No current complaints of fever chills, or cardiac, pulmonary, GI GU issues  Objective: Vital Signs: BP 121/62 (BP Location: Left Arm, Patient Position: Sitting)   Pulse 80   Ht _0  (1.6 m)   Wt (!) 335 lb (152 kg)   BMI 59.34 kg/m   Physical Exam HENT:     Head: Normocephalic.  Eyes:     Extraocular Movements: Extraocular movements intact.     Pupils: Pupils are equal, round, and reactive to light.  Musculoskeletal:     Comments: Lumbar spine incision healed.  No obvious signs infection.  Patient does have area of right greater left side incisional swelling.  This area is moderate to market tender.  No redness.  Area is soft and feels possibly more cystic.  Neurological:     General: No focal deficit present.     Mental Status: She is alert.  Psychiatric:        Mood and Affect: Mood normal.     Ortho Exam  Specialty Comments:  No specialty comments available.  Imaging: US Guided Needle Placement  Result Date: 01/08/2019 Please see Notes tab for imaging impression.    PMFS History: Patient Active Problem List   Diagnosis Date Noted  . Anemia due to blood loss, acute 06/18/2018    Class: Acute  . Plantar fasciitis of left foot 06/16/2018    Class: Chronic  . Tendonitis, Achilles, right 06/16/2018  Class: Chronic  . Osteochondral talar dome lesion 06/16/2018    Class: Chronic  . Deep incisional surgical site infection   . Superficial incisional surgical site infection 06/11/2018    Class: Acute  . Right ankle effusion 06/11/2018    Class: Acute  . Morbid (severe) obesity due to excess calories ([place]) 05/29/2018    Class: Chronic  . Spondylolisthesis, lumbar region 05/27/2018    Class: Chronic  . Spinal stenosis of lumbar region 05/27/2018    Class: Chronic  . Urinary tract infection 05/27/2018    Class: Acute  . Fusion of spine of lumbar region 05/27/2018  . Pain of right hip joint 07/03/2017  .  Acute right-sided low back pain with right-sided sciatica 03/27/2017  . Chronic right shoulder pain 02/13/2017  . History of rotator cuff tear 11/30/2016  . Impingement syndrome of right shoulder 11/30/2016  . Exacerbation of asthma 07/18/2016  . Hypokalemia 07/18/2016  . Hypertension 07/18/2016  . Depression with anxiety 07/18/2016  . Hypothyroidism 07/18/2016  . Hyperglycemia 07/18/2016  . Acute asthma exacerbation 07/18/2016  . Surgical wound dehiscence left hip; questionable superficial infection 11/10/2015  . Left hip postoperative wound infection 11/10/2015  . Osteoarthritis of left hip 09/09/2015  . 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 Obesity (BMI 35.0-39.9 without comorbidity) 04/28/2013   Past Medical History:  Diagnosis Date  . Anemia    taking iron now. pt states having no current issues 09/02/2015  . Anginal pain ([place])    pt states experiences chest wall pain pt states related to her asthma   . Anxiety    with MRI's  . Arthritis    Everywhere  . Asthma   . Dizziness   . GERD (gastroesophageal reflux disease)   . Headache(784.0)    HX OF MIGRAINES  . History of bronchitis   . Hypertension   . Hypothyroidism    takes levothyroxen  . OSA on CPAP    wears cpap  . Shortness of breath    with exertion  . Wears glasses     Family History  Problem Relation Age of Onset  . Cancer Mother        colon  . Epilepsy Mother   . Cancer Father        prostate  . Diabetes Father   . Hypertension Father   . Hypertension Maternal Aunt   . Diabetes Maternal Aunt   . Hypertension Paternal Aunt     Past Surgical History:  Procedure Laterality Date  . CESAREAN SECTION     times 2  . CHOLECYSTECTOMY    . ENDOMETRIAL ABLATION    . INCISION AND DRAINAGE HIP Left 11/10/2015   Procedure: IRRIGATION AND DEBRIDEMENT LEFT HIP INCISION;  Surgeon: Mcarthur Rossetti, MD;  Location: [facility];  Service: Orthopedics;  Laterality: Left;  . JOINT REPLACEMENT   2011   total left knee  . KNEE ARTHROPLASTY  04/23/2012   right   . KNEE ARTHROSCOPY    . LUMBAR WOUND DEBRIDEMENT N/A 06/11/2018   Procedure: LUMBAR WOUND DEBRIDEMENT;  Surgeon: Jessy Oto, MD;  Location: [facility];  Service: Orthopedics;  Laterality: N/A;  . RADIOLOGY WITH ANESTHESIA N/A 09/09/2018   Procedure: LUMBER SPINE WITHOUT CONTRAST;  Surgeon: Radiologist, Medication, MD;  Location: [facility];  Service: Radiology;  Laterality: N/A;  . ROTATOR CUFF REPAIR     left   . SHOULDER SURGERY     right to repair ligament tear  . TOTAL HIP  ARTHROPLASTY Left 09/09/2015   Procedure: LEFT TOTAL HIP ARTHROPLASTY ANTERIOR APPROACH;  Surgeon: Mcarthur Rossetti, MD;  Location: [facility];  Service: Orthopedics;  Laterality: Left;  . TOTAL KNEE ARTHROPLASTY  04/23/2012   Procedure: TOTAL KNEE ARTHROPLASTY;  Surgeon: Newt Minion, MD;  Location: [facility];  Service: Orthopedics;  Laterality: Right;  Right Total Knee Arthroplasty  . TUBAL LIGATION  1996   Social History   Occupational History  . Not on file  Tobacco Use  . Smoking status: Former Smoker    Packs/day: 0.50    Years: 4.00    Pack years: 2.00    Last attempt to quit: 09/18/1991    Years since quitting: 27.3  . Smokeless tobacco: Never Used  Substance and Sexual Activity  . Alcohol use: No  . Drug use: No  . Sexual activity: Yes    Birth control/protection: Surgical

## 2019-01-09 ENCOUNTER — Other Ambulatory Visit (INDEPENDENT_AMBULATORY_CARE_PROVIDER_SITE_OTHER): Payer: Self-pay | Admitting: Surgery

## 2019-01-09 ENCOUNTER — Other Ambulatory Visit (INDEPENDENT_AMBULATORY_CARE_PROVIDER_SITE_OTHER): Payer: Self-pay | Admitting: Specialist

## 2019-01-09 ENCOUNTER — Encounter (HOSPITAL_COMMUNITY): Payer: Self-pay

## 2019-01-09 ENCOUNTER — Ambulatory Visit (HOSPITAL_COMMUNITY)
Admission: RE | Admit: 2019-01-09 | Discharge: 2019-01-09 | Disposition: A | Discharge status: Home / Self Care | Payer: Medicare Other | Source: Ambulatory Visit

## 2019-01-09 ENCOUNTER — Other Ambulatory Visit: Payer: Self-pay

## 2019-01-09 ENCOUNTER — Ambulatory Visit (HOSPITAL_COMMUNITY)
Admission: RE | Admit: 2019-01-09 | Discharge: 2019-01-09 | Disposition: A | Discharge status: Home / Self Care | Payer: Medicare Other | Source: Ambulatory Visit | Attending: Surgery | Admitting: Surgery

## 2019-01-09 DIAGNOSIS — K219 Gastro-esophageal reflux disease without esophagitis: Secondary | ICD-10-CM | POA: Insufficient documentation

## 2019-01-09 DIAGNOSIS — M545 Low back pain, unspecified: Secondary | ICD-10-CM

## 2019-01-09 DIAGNOSIS — G8929 Other chronic pain: Secondary | ICD-10-CM | POA: Diagnosis present

## 2019-01-09 DIAGNOSIS — E039 Hypothyroidism, unspecified: Secondary | ICD-10-CM | POA: Diagnosis not present

## 2019-01-09 DIAGNOSIS — J45909 Unspecified asthma, uncomplicated: Secondary | ICD-10-CM | POA: Diagnosis not present

## 2019-01-09 DIAGNOSIS — Z79899 Other long term (current) drug therapy: Secondary | ICD-10-CM | POA: Insufficient documentation

## 2019-01-09 DIAGNOSIS — I10 Essential (primary) hypertension: Secondary | ICD-10-CM | POA: Diagnosis not present

## 2019-01-09 DIAGNOSIS — G4733 Obstructive sleep apnea (adult) (pediatric): Secondary | ICD-10-CM | POA: Insufficient documentation

## 2019-01-09 DIAGNOSIS — Z79890 Hormone replacement therapy (postmenopausal): Secondary | ICD-10-CM | POA: Insufficient documentation

## 2019-01-09 DIAGNOSIS — D649 Anemia, unspecified: Secondary | ICD-10-CM | POA: Diagnosis not present

## 2019-01-09 LAB — COMPREHENSIVE METABOLIC PANEL
AG Ratio: 1.4 (calc) (ref 1.0–2.5)
ALT: 27 U/L (ref 6–29)
AST: 22 U/L (ref 10–35)
Albumin: 4.1 g/dL (ref 3.6–5.1)
Alkaline phosphatase (APISO): 94 U/L (ref 31–125)
BUN/Creatinine Ratio: 13 (calc) (ref 6–22)
BUN: 17 mg/dL (ref 7–25)
CO2: 28 mmol/L (ref 20–32)
Calcium: 9.1 mg/dL (ref 8.6–10.2)
Chloride: 103 mmol/L (ref 98–110)
Creat: 1.26 mg/dL — ABNORMAL HIGH (ref 0.50–1.10)
Globulin: 3 g/dL (calc) (ref 1.9–3.7)
Glucose, Bld: 104 mg/dL — ABNORMAL HIGH (ref 65–99)
Potassium: 3.9 mmol/L (ref 3.5–5.3)
Sodium: 140 mmol/L (ref 135–146)
Total Bilirubin: 0.3 mg/dL (ref 0.2–1.2)
Total Protein: 7.1 g/dL (ref 6.1–8.1)

## 2019-01-09 LAB — CBC WITH DIFFERENTIAL/PLATELET
Abs Immature Granulocytes: 0.03 10*3/uL (ref 0.00–0.07)
Basophils Absolute: 0 10*3/uL (ref 0.0–0.1)
Basophils Relative: 0 %
Eosinophils Absolute: 0.4 10*3/uL (ref 0.0–0.5)
Eosinophils Relative: 6 %
HCT: 37.1 % (ref 36.0–46.0)
Hemoglobin: 11.1 g/dL — ABNORMAL LOW (ref 12.0–15.0)
Immature Granulocytes: 1 %
Lymphocytes Relative: 29 %
Lymphs Abs: 1.8 10*3/uL (ref 0.7–4.0)
MCH: 27.8 pg (ref 26.0–34.0)
MCHC: 29.9 g/dL — ABNORMAL LOW (ref 30.0–36.0)
MCV: 92.8 fL (ref 80.0–100.0)
Monocytes Absolute: 0.3 10*3/uL (ref 0.1–1.0)
Monocytes Relative: 5 %
Neutro Abs: 3.6 10*3/uL (ref 1.7–7.7)
Neutrophils Relative %: 59 %
Platelets: 260 10*3/uL (ref 150–400)
RBC: 4 MIL/uL (ref 3.87–5.11)
RDW: 14.9 % (ref 11.5–15.5)
WBC: 6.1 10*3/uL (ref 4.0–10.5)
nRBC: 0 % (ref 0.0–0.2)

## 2019-01-09 LAB — PROTIME-INR
INR: 1 (ref 0.8–1.2)
Prothrombin Time: 13 seconds (ref 11.4–15.2)

## 2019-01-09 LAB — C-REACTIVE PROTEIN: CRP: 30.7 mg/L — ABNORMAL HIGH (ref <8.0)

## 2019-01-09 LAB — URIC ACID: Uric Acid, Serum: 5.9 mg/dL (ref 2.5–7.0)

## 2019-01-09 LAB — SEDIMENTATION RATE: Sed Rate: 56 mm/h — ABNORMAL HIGH (ref 0–20)

## 2019-01-09 MED ORDER — MIDAZOLAM HCL 2 MG/2ML IJ SOLN
INTRAMUSCULAR | Status: AC
  Filled 2019-01-09: qty 4

## 2019-01-09 MED ORDER — LIDOCAINE HCL 1 % IJ SOLN
INTRAMUSCULAR | Status: AC
  Filled 2019-01-09: qty 20

## 2019-01-09 MED ORDER — FENTANYL CITRATE (PF) 100 MCG/2ML IJ SOLN
INTRAMUSCULAR | Status: AC
  Filled 2019-01-09: qty 2

## 2019-01-09 MED ORDER — SODIUM CHLORIDE 0.9 % IV SOLN
INTRAVENOUS | Status: DC
  Administered 2019-01-09: 13:00:00 via INTRAVENOUS

## 2019-01-09 NOTE — H&P (Signed)
  Referring Physician(s): Owens,James M/Nitka,J  Supervising Physician: Henn, Adam  Patient Status:  [facility]  Chief Complaint: Back pain   Subjective: Patient familiar to IR service from prior PICC placement in 2019.  She has a history of chronic bilateral low back pain, status post L4-5 and L5-S1 fusion in September 2019 complicated by postop infection (cx with rare pseudomonas) and wound dehiscence requiring I&D and wound VAC placement.  Over the last several months she has had increased swelling and tenderness at her prior incision site in lumbar region.  Both sed rate and C-reactive protein are elevated ;request now received for ultrasound-guided aspiration of site for further evaluation.  She currently denies fever, headache, chest pain, abdominal pain, nausea, vomiting or bleeding.  She does have occasional dyspnea with exertion and cough.   Past Medical History:  Diagnosis Date  . Anemia    taking iron now. pt states having no current issues 09/02/2015  . Anginal pain (HCC)    pt states experiences chest wall pain pt states related to her asthma   . Anxiety    with MRI's  . Arthritis    Everywhere  . Asthma   . Dizziness   . GERD (gastroesophageal reflux disease)   . Headache(784.0)    HX OF MIGRAINES  . History of bronchitis   . Hypertension   . Hypothyroidism    takes levothyroxen  . OSA on CPAP    wears cpap  . Shortness of breath    with exertion  . Wears glasses    Past Surgical History:  Procedure Laterality Date  . CESAREAN SECTION     times 2  . CHOLECYSTECTOMY    . ENDOMETRIAL ABLATION    . INCISION AND DRAINAGE HIP Left 11/10/2015   Procedure: IRRIGATION AND DEBRIDEMENT LEFT HIP INCISION;  Surgeon: Christopher Y Blackman, MD;  Location: [facility] OR;  Service: Orthopedics;  Laterality: Left;  . JOINT REPLACEMENT  2011   total left knee  . KNEE ARTHROPLASTY  04/23/2012   right   . KNEE ARTHROSCOPY    . LUMBAR WOUND DEBRIDEMENT N/A 06/11/2018   Procedure:  LUMBAR WOUND DEBRIDEMENT;  Surgeon: Nitka, James E, MD;  Location: [facility] OR;  Service: Orthopedics;  Laterality: N/A;  . RADIOLOGY WITH ANESTHESIA N/A 09/09/2018   Procedure: LUMBER SPINE WITHOUT CONTRAST;  Surgeon: Radiologist, Medication, MD;  Location: [facility] OR;  Service: Radiology;  Laterality: N/A;  . ROTATOR CUFF REPAIR     left   . SHOULDER SURGERY     right to repair ligament tear  . TOTAL HIP ARTHROPLASTY Left 09/09/2015   Procedure: LEFT TOTAL HIP ARTHROPLASTY ANTERIOR APPROACH;  Surgeon: Christopher Y Blackman, MD;  Location: [facility];  Service: Orthopedics;  Laterality: Left;  . TOTAL KNEE ARTHROPLASTY  04/23/2012   Procedure: TOTAL KNEE ARTHROPLASTY;  Surgeon: Marcus V Duda, MD;  Location: [facility];  Service: Orthopedics;  Laterality: Right;  Right Total Knee Arthroplasty  . TUBAL LIGATION  1996      Allergies: Lisinopril; Bee venom; Propoxyphene; Codeine; Latex; Meloxicam; Morphine and related; and Tomato  Medications: Prior to Admission medications   Medication Sig Start Date End Date Taking? Authorizing Provider  albuterol (PROVENTIL HFA;VENTOLIN HFA) 108 (90 BASE) MCG/ACT inhaler Inhale 2 puffs into the lungs every 6 (six) hours as needed for wheezing or shortness of breath.    [provider]  albuterol (PROVENTIL) (2.5 MG/3ML) 0.083% nebulizer solution Take 3 mLs (2.5 mg total) by nebulization 3 (three) times daily. 07/19/16     Thurnell Lose, MD  allopurinol (ZYLOPRIM) 100 MG tablet Take 1 tablet by mouth twice daily 01/07/19   Jessy Oto, MD  ALPRAZolam Duanne Moron) 1 MG tablet take 1 tablet 1 hour prior to MRI repeat 30  Ins prior if needed 08/06/18   Jessy Oto, MD  amoxicillin-clavulanate (AUGMENTIN) 875-125 MG tablet Take 1 tablet po  2 hours prior to procedure and 1 tablet po 8 hours after procedure 10/08/18   Jessy Oto, MD  cetirizine (ZYRTEC) 10 MG tablet Take 10 mg by mouth daily.     [provider]  cyclobenzaprine (FLEXERIL) 10 MG tablet Take 1  tablet by mouth three times daily as needed for muscle spasm 12/16/18   Mcarthur Rossetti, MD  diclofenac (VOLTAREN) 75 MG EC tablet Take 1 tablet (75 mg total) by mouth 2 (two) times daily. Take with food 10/07/18   Triplett, Tammy, PA-C  diclofenac sodium (VOLTAREN) 1 % GEL Apply 4 g topically 4 (four) times daily. Patient taking differently: Apply 4 g topically 4 (four) times daily as needed (pain).  06/19/18   Jessy Oto, MD  diphenhydrAMINE (BENADRYL) 25 MG tablet Take 1 tablet (25 mg total) by mouth every 6 (six) hours. Patient taking differently: Take 25 mg by mouth every 6 (six) hours as needed for itching or allergies.  09/29/15   Jola Schmidt, MD  diphenhydrAMINE HCl, Sleep, (SOBA NIGHTTIME SLEEP AID) 50 MG CAPS Take 50 mg by mouth at bedtime.    [provider]  docusate sodium (COLACE) 100 MG capsule Take 1 capsule (100 mg total) by mouth 2 (two) times daily. 05/30/18   Jessy Oto, MD  ergocalciferol (VITAMIN D2) 50000 units capsule Take 50,000 Units by mouth every Monday.     [provider]  ferrous gluconate (FERGON) 324 MG tablet Take 1 tablet (324 mg total) by mouth 2 (two) times daily with a meal. 05/30/18   Jessy Oto, MD  furosemide (LASIX) 20 MG tablet Take 20 mg by mouth daily as needed for fluid.  11/01/17   [provider]  gabapentin (NEURONTIN) 300 MG capsule Take 1 capsule (300 mg total) by mouth 3 (three) times daily. 11/27/18   Jessy Oto, MD  hydrochlorothiazide 25 MG tablet Take 25 mg by mouth daily.     [provider]  levothyroxine (SYNTHROID, LEVOTHROID) 137 MCG tablet Take 137 mcg by mouth daily before breakfast.  05/31/17   [provider]  NARCAN 4 MG/0.1ML LIQD nasal spray kit Place 0.4 mg into the nose once.  06/28/17   [provider]  oxybutynin (DITROPAN-XL) 10 MG 24 hr tablet Take 10 mg by mouth daily.  05/31/17   [provider]  pantoprazole (PROTONIX) 20 MG tablet Take 1 tablet  (20 mg total) by mouth 2 (two) times daily before a meal. Patient taking differently: Take 20 mg by mouth 2 (two) times daily before a meal. prn 03/13/18   Virgel Manifold, MD  potassium chloride (K-DUR,KLOR-CON) 10 MEQ tablet Take 10 mEq by mouth 2 (two) times daily.     [provider]     Vital Signs:pend   Physical Exam awake, alert.  Chest clear to auscultation bilaterally.  Heart with regular rate and rhythm.  Abdomen obese, positive bowel sounds, nontender; surgical site lower lumbar region with no significant erythema but some sl SQ edema/fluctuance laterally to wound site, right greater than left, mildly tender to palpation.  No obvious drainage at site.  Extremities with full range of motion  Imaging: Us Guided Needle Placement  Result Date: 01/08/2019 Please see Notes tab for imaging impression.   Labs:  CBC: Recent Labs    06/17/18 0823 07/10/18 1253 07/18/18 1621 09/09/18 0709  WBC 4.2 4.2 6.0 8.2  HGB 8.1* 10.1* 10.9* 9.7*  HCT 25.8* 36.6 35.1* 31.8*  PLT 331 271 363 358    COAGS: Recent Labs    05/23/18 1034 06/11/18 2100  INR 1.03 1.15  APTT 37* 43*    BMP: Recent Labs    06/17/18 0823 07/10/18 1508 07/18/18 1621 09/09/18 0709 01/08/19 1028  NA 136 136 138 142 140  K 3.4* 3.9 4.1 3.7 3.9  CL 98 103 104 105 103  CO2 29 24 22 23 28  GLUCOSE 101* 100* 100* 104* 104*  BUN 21* 10 14 20 17  CALCIUM 9.5 9.5 10.4* 9.4 9.1  CREATININE 0.99 1.01* 1.21* 1.28* 1.26*  GFRNONAA >60 >60 52* 49*  --   GFRAA >60 >60 60* 57*  --     LIVER FUNCTION TESTS: Recent Labs    05/23/18 1034 06/11/18 2100 06/17/18 0823 07/10/18 1508 01/08/19 1028  BILITOT 1.0 0.6 0.5 0.8 0.3  AST 32 22 18 27 22  ALT 28 13 12 15 27  ALKPHOS 69 52 54 65  --   PROT 7.8 6.9 6.5 7.6 7.1  ALBUMIN 3.6 2.9* 2.6* 3.3*  --     Assessment and Plan: Pt with history of chronic bilateral low back pain, status post L4-5 and L5-S1 fusion in September 2019 complicated by  postop infection (cx with rare pseudomonas) and wound dehiscence requiring I&D and wound VAC placement.  Over the last several months she has had increased swelling and tenderness at her prior incision site in lumbar region.  Both sed rate and C-reactive protein are elevated ;request now received for ultrasound-guided aspiration of site for further evaluation.  Details/risks of procedure, including but not limited to, internal bleeding, infection, injury to adjacent structures as well as cancellation of procedure if not deemed necessary discussed with patient.  LABS PENDING   Electronically Signed: D. Kevin , PA-C 01/09/2019, 12:10 PM   I spent a total of 20 minutes at the the patient's bedside AND on the patient's hospital floor or unit, greater than 50% of which was counseling/coordinating care for possible image guided aspiration of fluid collection in lumbar region      

## 2019-01-09 NOTE — Sedation Documentation (Signed)
No area to aspirate per Dr Lowella Dandy

## 2019-01-10 LAB — CBC WITH DIFFERENTIAL
Basophils Absolute: 0 10*3/uL (ref 0.0–0.2)
Basos: 0 %
EOS (ABSOLUTE): 0.4 10*3/uL (ref 0.0–0.4)
Eos: 7 %
Hematocrit: 36.5 % (ref 34.0–46.6)
Hemoglobin: 11.5 g/dL (ref 11.1–15.9)
Immature Grans (Abs): 0 10*3/uL (ref 0.0–0.1)
Immature Granulocytes: 0 %
Lymphocytes Absolute: 1.8 10*3/uL (ref 0.7–3.1)
Lymphs: 29 %
MCH: 27.5 pg (ref 26.6–33.0)
MCHC: 31.5 g/dL (ref 31.5–35.7)
MCV: 87 fL (ref 79–97)
Monocytes Absolute: 0.2 10*3/uL (ref 0.1–0.9)
Monocytes: 4 %
Neutrophils Absolute: 3.6 10*3/uL (ref 1.4–7.0)
Neutrophils: 60 %
RBC: 4.18 x10E6/uL (ref 3.77–5.28)
RDW: 14.3 % (ref 11.7–15.4)
WBC: 6.1 10*3/uL (ref 3.4–10.8)

## 2019-01-12 ENCOUNTER — Telehealth (INDEPENDENT_AMBULATORY_CARE_PROVIDER_SITE_OTHER): Payer: Self-pay | Admitting: Specialist

## 2019-01-12 NOTE — Telephone Encounter (Signed)
Allopurinol refill request  

## 2019-01-12 NOTE — Telephone Encounter (Signed)
Rx refill Allopurinol

## 2019-01-13 NOTE — Telephone Encounter (Signed)
This was done ---see chart

## 2019-01-14 ENCOUNTER — Other Ambulatory Visit: Payer: Self-pay | Admitting: Specialist

## 2019-01-14 ENCOUNTER — Telehealth (INDEPENDENT_AMBULATORY_CARE_PROVIDER_SITE_OTHER): Payer: Self-pay | Admitting: Specialist

## 2019-01-14 NOTE — Telephone Encounter (Signed)
I called the rx to [facility] was sent in to them on Monday 01/12/2019

## 2019-01-14 NOTE — Telephone Encounter (Signed)
3rd request per patient for Rx refill Allopurinol [facility] still has not received refill request.

## 2019-01-16 ENCOUNTER — Telehealth: Payer: Self-pay | Admitting: Specialist

## 2019-01-16 ENCOUNTER — Other Ambulatory Visit (INDEPENDENT_AMBULATORY_CARE_PROVIDER_SITE_OTHER): Payer: Self-pay | Admitting: Specialist

## 2019-01-16 MED ORDER — HYDROCODONE-ACETAMINOPHEN 7.5-325 MG PO TABS: 1.0000 | ORAL_TABLET | Freq: Four times a day (QID) | ORAL | 0 refills | Status: DC | PRN

## 2019-01-16 NOTE — Telephone Encounter (Signed)
Patient calling again regarding her pain med.  She knows we are closing at 12pm today and does not want to go thru the weekend without it. Please respond

## 2019-01-16 NOTE — Telephone Encounter (Signed)
Patient calling again regarding her pain med.  She knows we are closing at 12pm today and does not want to go thru the weekend without it. Please respond   

## 2019-01-16 NOTE — Telephone Encounter (Signed)
Pt called asking to speak with [facility] or Dr. Otelia Sergeant.  She needs a refill on her pain medication.

## 2019-01-20 ENCOUNTER — Encounter (INDEPENDENT_AMBULATORY_CARE_PROVIDER_SITE_OTHER): Payer: Self-pay | Admitting: Physical Medicine and Rehabilitation

## 2019-01-22 ENCOUNTER — Other Ambulatory Visit (INDEPENDENT_AMBULATORY_CARE_PROVIDER_SITE_OTHER): Payer: Self-pay | Admitting: Orthopaedic Surgery

## 2019-01-22 NOTE — Telephone Encounter (Signed)
Please advise 

## 2019-01-26 ENCOUNTER — Telehealth: Payer: Self-pay | Admitting: Physical Medicine and Rehabilitation

## 2019-01-26 ENCOUNTER — Encounter: Payer: Self-pay | Admitting: Physical Medicine and Rehabilitation

## 2019-01-26 NOTE — Telephone Encounter (Signed)
Pt called in said she needs to have her appt reschedule due to not knowing that Dr.Newton was resuming injections.  [phone]

## 2019-01-27 NOTE — Telephone Encounter (Signed)
Called patient to reschedule.

## 2019-02-03 ENCOUNTER — Other Ambulatory Visit: Payer: Self-pay

## 2019-02-03 ENCOUNTER — Encounter: Payer: Self-pay | Admitting: Physical Medicine and Rehabilitation

## 2019-02-03 ENCOUNTER — Ambulatory Visit: Payer: Self-pay

## 2019-02-03 ENCOUNTER — Ambulatory Visit (INDEPENDENT_AMBULATORY_CARE_PROVIDER_SITE_OTHER): Payer: Medicare Other | Admitting: Physical Medicine and Rehabilitation

## 2019-02-03 ENCOUNTER — Other Ambulatory Visit: Payer: Self-pay | Admitting: Radiology

## 2019-02-03 VITALS — BP 141/56 | HR 83

## 2019-02-03 DIAGNOSIS — M5416 Radiculopathy, lumbar region: Secondary | ICD-10-CM

## 2019-02-03 DIAGNOSIS — E669 Obesity, unspecified: Secondary | ICD-10-CM

## 2019-02-03 DIAGNOSIS — M961 Postlaminectomy syndrome, not elsewhere classified: Secondary | ICD-10-CM

## 2019-02-03 MED ORDER — BETAMETHASONE SOD PHOS & ACET 6 (3-3) MG/ML IJ SUSP
12.0000 mg | Freq: Once | INTRAMUSCULAR | Status: AC
  Administered 2019-02-03: 12 mg

## 2019-02-03 NOTE — Progress Notes (Signed)
 .  Numeric Pain Rating Scale and Functional Assessment Average Pain 5   In the last MONTH (on 0-10 scale) has pain interfered with the following?  1. General activity like being  able to carry out your everyday physical activities such as walking, climbing stairs, carrying groceries, or moving a chair?  Rating(6)   +Driver, -BT, -Dye Allergies.  

## 2019-02-04 ENCOUNTER — Other Ambulatory Visit (INDEPENDENT_AMBULATORY_CARE_PROVIDER_SITE_OTHER): Payer: Self-pay | Admitting: Specialist

## 2019-02-04 MED ORDER — HYDROCODONE-ACETAMINOPHEN 5-325 MG PO TABS: 1.0000 | ORAL_TABLET | Freq: Four times a day (QID) | ORAL | 0 refills | Status: DC | PRN

## 2019-02-04 NOTE — Procedures (Signed)
Lumbosacral Transforaminal Epidural Steroid Injection - Sub-Pedicular Approach with Fluoroscopic Guidance  Patient: Marilyn [name]      Date of Birth: July 12, 1971 MRN: [id number] PCP: Joette Catching, MD      Visit Date: 02/03/2019   Universal Protocol:    Date/Time: 02/03/2019  Consent Given By: the patient  Position: PRONE  Additional Comments: Vital signs were monitored before and after the procedure. Patient was prepped and draped in the usual sterile fashion. The correct patient, procedure, and site was verified.   Injection Procedure Details:  Procedure Site One Meds Administered:  Meds ordered this encounter  Medications  . betamethasone acetate-betamethasone sodium phosphate (CELESTONE) injection 12 mg    Laterality: Right  Location/Site:  L5-S1  Needle size: 22 G, 7 inch spinal needle secondary to body habitus.  Needle type: Spinal  Needle Placement: Transforaminal  Findings:    -Comments: Excellent flow of contrast along the nerve and into the epidural space.  Procedure Details: After squaring off the end-plates to get a true AP view, the C-arm was positioned so that an oblique view of the foramen as noted above was visualized. The target area is just inferior to the "nose of the scotty dog" or sub pedicular. The soft tissues overlying this structure were infiltrated with 2-3 ml. of 1% Lidocaine without Epinephrine.  The spinal needle was inserted toward the target using a "trajectory" view along the fluoroscope beam.  Under AP and lateral visualization, the needle was advanced so it did not puncture dura and was located close the 6 O'Clock position of the pedical in AP tracterory. Biplanar projections were used to confirm position. Aspiration was confirmed to be negative for CSF and/or blood. A 1-2 ml. volume of Isovue-250 was injected and flow of contrast was noted at each level. Radiographs were obtained for documentation purposes.   After attaining the  desired flow of contrast documented above, a 0.5 to 1.0 ml test dose of 0.25% Marcaine was injected into each respective transforaminal space.  The patient was observed for 90 seconds post injection.  After no sensory deficits were reported, and normal lower extremity motor function was noted,   the above injectate was administered so that equal amounts of the injectate were placed at each foramen (level) into the transforaminal epidural space.   Additional Comments:  The patient tolerated the procedure well Dressing: 2 x 2 sterile gauze and Band-Aid    Post-procedure details: Patient was observed during the procedure. Post-procedure instructions were reviewed.  Patient left the clinic in stable condition.

## 2019-02-04 NOTE — Progress Notes (Signed)
Marilyn Redderhelma M Dement - 48 y.o. female MRN [id number]  Date of birth: 1971-06-30  Office Visit Note: Visit Date: 02/03/2019 PCP: Joette CatchingNyland, Leonard, MD Referred by: Joette CatchingNyland, Leonard, MD  Subjective: Chief Complaint  Patient presents with  . Lower Back - Pain  . Right Thigh - Pain   HPI:  Marilyn [name] is a 48 y.o. female who comes in today At the request of Dr. Vira BrownsJames Nitka for right L5 transforaminal epidural steroid injection diagnostically and therapeutically.  Patient continues to have pain in the right thigh that has been ongoing for several months.  She is status post L4-5 and L5-S1 instrumented fusion.  She has had prior right L4 transforaminal injection without much relief.  Her case is complicated by morbid obesity.  We are going to complete the right L5 transforaminal injection today and she will follow-up with Dr. Otelia SergeantNitka.  ROS Otherwise per HPI.  Assessment & Plan: Visit Diagnoses:  1. Lumbar radiculopathy   2. Post laminectomy syndrome   3. Morbid (severe) obesity due to excess calories (HCC)   4. Obesity (BMI 35.0-39.9 without comorbidity)     Plan: No additional findings.   Meds & Orders:  Meds ordered this encounter  Medications  . betamethasone acetate-betamethasone sodium phosphate (CELESTONE) injection 12 mg    Orders Placed This Encounter  Procedures  . XR C-ARM NO REPORT  . Epidural Steroid injection    Follow-up: Return if symptoms worsen or fail to improve.   Procedures: No procedures performed  Lumbosacral Transforaminal Epidural Steroid Injection - Sub-Pedicular Approach with Fluoroscopic Guidance  Patient: Marilyn [name]      Date of Birth: 1971-06-30 MRN: [id number] PCP: Joette CatchingNyland, Leonard, MD      Visit Date: 02/03/2019   Universal Protocol:    Date/Time: 02/03/2019  Consent Given By: the patient  Position: PRONE  Additional Comments: Vital signs were monitored before and after the procedure. Patient was prepped and draped in the usual  sterile fashion. The correct patient, procedure, and site was verified.   Injection Procedure Details:  Procedure Site One Meds Administered:  Meds ordered this encounter  Medications  . betamethasone acetate-betamethasone sodium phosphate (CELESTONE) injection 12 mg    Laterality: Right  Location/Site:  L5-S1  Needle size: 22 G, 7 inch spinal needle secondary to body habitus.  Needle type: Spinal  Needle Placement: Transforaminal  Findings:    -Comments: Excellent flow of contrast along the nerve and into the epidural space.  Procedure Details: After squaring off the end-plates to get a true AP view, the C-arm was positioned so that an oblique view of the foramen as noted above was visualized. The target area is just inferior to the "nose of the scotty dog" or sub pedicular. The soft tissues overlying this structure were infiltrated with 2-3 ml. of 1% Lidocaine without Epinephrine.  The spinal needle was inserted toward the target using a "trajectory" view along the fluoroscope beam.  Under AP and lateral visualization, the needle was advanced so it did not puncture dura and was located close the 6 O'Clock position of the pedical in AP tracterory. Biplanar projections were used to confirm position. Aspiration was confirmed to be negative for CSF and/or blood. A 1-2 ml. volume of Isovue-250 was injected and flow of contrast was noted at each level. Radiographs were obtained for documentation purposes.   After attaining the desired flow of contrast documented above, a 0.5 to 1.0 ml test dose of 0.25% Marcaine was injected into each respective transforaminal  space.  The patient was observed for 90 seconds post injection.  After no sensory deficits were reported, and normal lower extremity motor function was noted,   the above injectate was administered so that equal amounts of the injectate were placed at each foramen (level) into the transforaminal epidural space.   Additional  Comments:  The patient tolerated the procedure well Dressing: 2 x 2 sterile gauze and Band-Aid    Post-procedure details: Patient was observed during the procedure. Post-procedure instructions were reviewed.  Patient left the clinic in stable condition.     Clinical History: MRI LUMBAR SPINE WITHOUT CONTRAST  TECHNIQUE: Multiplanar, multisequence MR imaging of the lumbar spine was performed. No intravenous contrast was administered.  The patient required anesthesia.  COMPARISON:  None.  FINDINGS: Segmentation:  Standard.  Alignment: Trace anterolisthesis L4-5 and L5-S1 improved postoperatively.  Vertebrae: Slight inferior L4 endplate T2 and STIR hyperintensity, favored to represent sequelae of cage subsidence, rather than early changes of discitis/osteomyelitis.  Conus medullaris and cauda equina: Conus extends to the L1 level. Conus and cauda equina appear normal.  Paraspinal and other soft tissues: No psoas inflammation or abscess. No epidural inflammation.  There is no significant postoperative fluid collection in the paravertebral soft tissues although moderate inflammation along the course of the incision is observed, an expected postoperative finding.  Disc levels:  L1-L2: Facet arthropathy. Unremarkable disc space. No impingement.  L2-L3: Facet arthropathy. Unremarkable disc space. No impingement.  L3-L4: Advanced facet arthropathy and ligamentum flavum hypertrophy. Unremarkable disc space. Borderline stenosis without compressive subarticular zone narrowing. BILATERAL foraminal zone narrowing, slightly worse on the RIGHT, could affect either L3 nerve root. See series 5, image 6  L4-L5: Status post L4-5 TLIF. Artifact related to pedicle screw and rod construct. Improved stenosis and subarticular zone narrowing postoperatively. Residual disc material and facet arthropathy narrow the RIGHT foraminal zone, potentially compressing the RIGHT  L4 nerve root. See for instance series 7, image 5.  L5-S1: Status post L5-S1 TLIF. Artifact related to pedicle screw and rod construct. Improved stenosis and subarticular zone narrowing postoperatively. Residual disc material and multifactorial foraminal narrowing bilaterally could affect either L5 nerve root.  IMPRESSION: Status post L4-S1 TLIF. No significant postoperative extra-spinal fluid collection. No features concerning for discitis/osteomyelitis.  Slight inferior L4 endplate T2 and STIR hyperintensity, favored to represent early cage subsidence. Continued surveillance is warranted.  Improved stenosis and subarticular zone narrowing at L4-5 and L5-S1. Correlate clinically for residual symptomatic RIGHT-sided foraminal narrowing at either or both levels.  BILATERAL foraminal narrowing is observed at L3-L4, without interval changes of disc protrusion/adjacent segment disease. Either L3 nerve root could be affected.  If further/subsequent lumbar spine imaging is performed, with regard to the question of infection, post infusion imaging with Stormy Fabian is strongly recommended.   Electronically Signed   By: Elsie Stain M.D.   On: 09/09/2018 10:00     Objective:  VS:  HT:    WT:   BMI:     BP:(!) 141/56  HR:83bpm  TEMP: ( )  RESP:  Physical Exam  Ortho Exam Imaging: Xr C-arm No Report  Result Date: 02/03/2019 Please see Notes tab for imaging impression.

## 2019-02-06 ENCOUNTER — Encounter: Payer: Self-pay | Admitting: Specialist

## 2019-02-06 ENCOUNTER — Other Ambulatory Visit: Payer: Self-pay

## 2019-02-06 ENCOUNTER — Ambulatory Visit (INDEPENDENT_AMBULATORY_CARE_PROVIDER_SITE_OTHER): Payer: Medicare Other | Admitting: Specialist

## 2019-02-06 VITALS — BP 157/84 | HR 82 | Ht 63.0 in | Wt 335.0 lb

## 2019-02-06 DIAGNOSIS — Z981 Arthrodesis status: Secondary | ICD-10-CM

## 2019-02-06 DIAGNOSIS — M5416 Radiculopathy, lumbar region: Secondary | ICD-10-CM | POA: Diagnosis not present

## 2019-02-06 MED ORDER — GABAPENTIN 300 MG PO CAPS: ORAL_CAPSULE | ORAL | 3 refills | Status: DC

## 2019-02-06 MED ORDER — DICLOFENAC SODIUM 75 MG PO TBEC: 75.0000 mg | DELAYED_RELEASE_TABLET | Freq: Two times a day (BID) | ORAL | 0 refills | Status: DC

## 2019-02-06 MED ORDER — TIZANIDINE HCL 4 MG PO TABS: 4.0000 mg | ORAL_TABLET | Freq: Four times a day (QID) | ORAL | 0 refills | Status: DC | PRN

## 2019-02-06 MED ORDER — DICLOFENAC SODIUM 75 MG PO TBEC: 75.0000 mg | DELAYED_RELEASE_TABLET | Freq: Two times a day (BID) | ORAL | 2 refills | Status: DC

## 2019-02-06 NOTE — Patient Instructions (Addendum)
Avoid frequent bending and stooping  No lifting greater than 10 lbs. May use ice or moist heat for pain. Weight loss is of benefit. Best medication for lumbar disc disease is arthritis medications like diclofenac and naprosyn. Exercise is important to improve your indurance and does allow people to function better inspite of back pain. Will stop flexeril and start tizanidine 4 mg up to 4 times per day. Stop hydrocodone and increase neurontin to 600 mg at night and 300 in the AM and at noon. If the pain is not tolerable we will try to adjust with different meds to obtain relief. Keep in contact.  Please speak with Dr. Lysbeth Galas about weight reduction and whether bariatric surgery is a consideration to improve the chances that stresses on the spine and legs and previous left hip replacement might be improved and the  Medical risks associated with increase BMI may be avoided.  Please contact Dr. Alroy Bailiff in pain management to have him assess as to whether he can continue with pain management.

## 2019-02-06 NOTE — Progress Notes (Signed)
Office Visit Note   Patient: Marilyn [name]           Date of Birth: [birth year]           MRN: [id number] Visit Date: 02/06/2019              Requested by: Joette CatchingNyland, Leonard, MD [address] [place], [place] [id number] PCP: Joette CatchingNyland, Leonard, MD   Assessment & Plan: Visit Diagnoses:  1. Radiculopathy, lumbar region     Plan:Avoid frequent bending and stooping  No lifting greater than 10 lbs. May use ice or moist heat for pain. Weight loss is of benefit. Best medication for lumbar disc disease is arthritis medications like diclofenac and naprosyn. Exercise is important to improve your indurance and does allow people to function better inspite of back pain. Will stop flexeril and start tizanidine 4 mg up to 4 times per day. Stop hydrocodone and increase neurontin to 600 mg at night and 300 in the AM and at noon. If the pain is not tolerable we will try to adjust with different meds to obtain relief. Keep in contact.  Please speak with Dr. Lysbeth GalasNyland about weight reduction and whether bariatric surgery is a consideration to improve the chances that stresses on the spine and legs and previous left hip replacement might be improved and the  Medical risks associated with increase BMI may be avoided.  Please contact Dr. Alroy Bailiffomqua in pain management to have him assess as to whether he can continue with pain management  Follow-Up Instructions: No follow-ups on file.   Orders:  No orders of the defined types were placed in this encounter.  No orders of the defined types were placed in this encounter.     Procedures: No procedures performed   Clinical Data: No additional findings.   Subjective: Chief Complaint  Patient presents with  . Lower Back - Follow-up    48 year old female with history of Lumbar fusion L4-5 and L5-S1 9/20219 and then had I&D of the incision and went on to heal the incision with secondary intension. She is no longer taking antibiotics. No fever or chills.  She has been taking hydrocodone for pain and recent ESI was done by Dr. Alvester MorinNewton without relief of pain. Last PT has been after surgery. She has pain in the back and into right anterior thigh above the right knee. No meds of osteoporosis. No bowel or bladder.    Review of Systems   Objective: Vital Signs: BP (!) 157/84   Pulse 82   Ht 5\' 3"  (1.6 m)   Wt (!) 335 lb (152 kg)   BMI 59.34 kg/m   Physical Exam  Ortho Exam  Specialty Comments:  No specialty comments available.  Imaging: No results found.   PMFS History: Patient Active Problem List   Diagnosis Date Noted  . Anemia due to blood loss, acute 06/18/2018    Priority: High    Class: Acute  . Superficial incisional surgical site infection 06/11/2018    Priority: High    Class: Acute  . Right ankle effusion 06/11/2018    Priority: High    Class: Acute  . Morbid (severe) obesity due to excess calories (HCC) 05/29/2018    Priority: High    Class: Chronic  . Spondylolisthesis, lumbar region 05/27/2018    Priority: High    Class: Chronic  . Spinal stenosis of lumbar region 05/27/2018    Priority: High    Class: Chronic  . Urinary tract infection 05/27/2018  Priority: High    Class: Acute  . Plantar fasciitis of left foot 06/16/2018    Priority: Medium    Class: Chronic  . Tendonitis, Achilles, right 06/16/2018    Priority: Medium    Class: Chronic  . Osteochondral talar dome lesion 06/16/2018    Priority: Medium    Class: Chronic  . Deep incisional surgical site infection   . Fusion of spine of lumbar region 05/27/2018  . Pain of right hip joint 07/03/2017  . Acute right-sided low back pain with right-sided sciatica 03/27/2017  . Chronic right shoulder pain 02/13/2017  . History of rotator cuff tear 11/30/2016  . Impingement syndrome of right shoulder 11/30/2016  . Exacerbation of asthma 07/18/2016  . Hypokalemia 07/18/2016  . Hypertension 07/18/2016  . Depression with anxiety 07/18/2016  .  Hypothyroidism 07/18/2016  . Hyperglycemia 07/18/2016  . Acute asthma exacerbation 07/18/2016  . Surgical wound dehiscence left hip; questionable superficial infection 11/10/2015  . Left hip postoperative wound infection 11/10/2015  . Osteoarthritis of left hip 09/09/2015  . 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 Obesity (BMI 35.0-39.9 without comorbidity) 04/28/2013   Past Medical History:  Diagnosis Date  . Anemia    taking iron now. pt states having no current issues 09/02/2015  . Anginal pain (HCC)    pt states experiences chest wall pain pt states related to her asthma   . Anxiety    with MRI's  . Arthritis    Everywhere  . Asthma   . Dizziness   . GERD (gastroesophageal reflux disease)   . Headache(784.0)    HX OF MIGRAINES  . History of bronchitis   . Hypertension   . Hypothyroidism    takes levothyroxen  . OSA on CPAP    wears cpap  . Shortness of breath    with exertion  . Wears glasses     Family History  Problem Relation Age of Onset  . Cancer Mother        colon  . Epilepsy Mother   . Cancer Father        prostate  . Diabetes Father   . Hypertension Father   . Hypertension Maternal Aunt   . Diabetes Maternal Aunt   . Hypertension Paternal Aunt     Past Surgical History:  Procedure Laterality Date  . CESAREAN SECTION     times 2  . CHOLECYSTECTOMY    . ENDOMETRIAL ABLATION    . INCISION AND DRAINAGE HIP Left 11/10/2015   Procedure: IRRIGATION AND DEBRIDEMENT LEFT HIP INCISION;  Surgeon: Kathryne Hitch, MD;  Location: [facility] OR;  Service: Orthopedics;  Laterality: Left;  . JOINT REPLACEMENT  2011   total left knee  . KNEE ARTHROPLASTY  04/23/2012   right   . KNEE ARTHROSCOPY    . LUMBAR WOUND DEBRIDEMENT N/A 06/11/2018   Procedure: LUMBAR WOUND DEBRIDEMENT;  Surgeon: Kerrin Champagne, MD;  Location[facility];  Service: Orthopedics;  Laterality: N/A;  . RADIOLOGY WITH ANESTHESIA N/A 09/09/2018   Procedure: LUMBER SPINE WITHOUT  CONTRAST;  Surgeon: Radiologist, Medication, MD;  Location: [facility] OR;  Service: Radiology;  Laterality: N/A;  . ROTATOR CUFF REPAIR     left   . SHOULDER SURGERY     right to repair ligament tear  . TOTAL HIP ARTHROPLASTY Left 09/09/2015   Procedure: LEFT TOTAL HIP ARTHROPLASTY ANTERIOR APPROACH;  Surgeon: Kathryne Hitch, MD;  Location: [facility] OR[facility];  Service: Orthopedics;  Laterality: Left;  . TOTAL  KNEE ARTHROPLASTY  04/23/2012   Procedure: TOTAL KNEE ARTHROPLASTY;  Surgeon: [name], MD;  Location: [facility] OR;  Service: Orthopedics;  Laterality: Right;  Right Total Knee Arthroplasty  . TUBAL LIGATION  1996   Social History   Occupational History  . Not on file  Tobacco Use  . Smoking status: Former Smoker    Packs/day: 0.50    Years: 4.00    Pack years: 2.00    Last attempt to quit: 09/18/1991    Years since quitting: 27.4  . Smokeless tobacco: Never Used  Substance and Sexual Activity  . Alcohol use: No  . Drug use: No  . Sexual activity: Yes    Birth control/protection: Surgical

## 2019-02-10 ENCOUNTER — Telehealth: Payer: Self-pay | Admitting: Specialist

## 2019-02-10 NOTE — Telephone Encounter (Signed)
Patient called in wanting the notes from her last two visit faxed over to [facility] Fax number [phone]

## 2019-02-11 ENCOUNTER — Ambulatory Visit: Payer: Self-pay | Admitting: Specialist

## 2019-02-11 NOTE — Telephone Encounter (Signed)
Notes have been faxed

## 2019-02-15 ENCOUNTER — Telehealth: Payer: Self-pay | Admitting: Cardiology

## 2019-02-15 ENCOUNTER — Other Ambulatory Visit: Payer: Medicare Other

## 2019-02-15 DIAGNOSIS — Z20822 Contact with and (suspected) exposure to covid-19: Secondary | ICD-10-CM

## 2019-02-15 NOTE — Telephone Encounter (Signed)
Spoke with patient regarding recent office visit 5/22 and possible Covid exposure.  Covid order placed, appt Sunday 5/31 @ [address] [facility]

## 2019-02-16 LAB — NOVEL CORONAVIRUS, NAA: SARS-CoV-2, NAA: NOT DETECTED

## 2019-02-23 ENCOUNTER — Ambulatory Visit: Payer: Self-pay | Admitting: *Deleted

## 2019-02-23 ENCOUNTER — Other Ambulatory Visit (INDEPENDENT_AMBULATORY_CARE_PROVIDER_SITE_OTHER): Payer: Self-pay | Admitting: Specialist

## 2019-02-23 ENCOUNTER — Telehealth: Payer: Self-pay | Admitting: Specialist

## 2019-02-23 MED ORDER — ALLOPURINOL 100 MG PO TABS: 100.0000 mg | ORAL_TABLET | Freq: Two times a day (BID) | ORAL | 6 refills | Status: DC

## 2019-02-23 NOTE — Telephone Encounter (Signed)
Please advise on message below. Thank you!

## 2019-02-23 NOTE — Telephone Encounter (Signed)
Rx sent to her pharmacy with 6 refills, normally her primary care MD would take over this medication. Gout is a medical  Diagnosis and normally does not require surgery but does require follow up and testing for cause. Marilyn [name]

## 2019-02-23 NOTE — Telephone Encounter (Signed)
Patient called and wanted to know if Louanne Skye wanted her to refill Allopurinol 100 mg or stop it completely. Patient stated needed to know asap.  [phone]

## 2019-02-23 NOTE — Telephone Encounter (Signed)
I came by on a Sunday and was tested for COVID-19.  My test was negative but I need a copy of it.   Can it be mailed to me?   It's on Mychart but she needs a printed copy.   I talked her through how to take a screen shot of her result from [place].   She did that and is going to send it to her e mail and print it that way.    She thanked me very much for my help.

## 2019-02-26 NOTE — Telephone Encounter (Signed)
I called and advised pt of message below.  

## 2019-03-31 ENCOUNTER — Telehealth: Payer: Self-pay | Admitting: Specialist

## 2019-03-31 NOTE — Telephone Encounter (Signed)
Patient called and stated she has pain going down right leg. The pinch nerve is a burning sensation and wants to know what to do about it.  Please call patient at [phone]

## 2019-03-31 NOTE — Telephone Encounter (Signed)
Worked patient in on 04/01/2019 to discuss.

## 2019-04-01 ENCOUNTER — Other Ambulatory Visit: Payer: Self-pay

## 2019-04-01 ENCOUNTER — Ambulatory Visit (INDEPENDENT_AMBULATORY_CARE_PROVIDER_SITE_OTHER): Payer: Medicare Other | Admitting: Specialist

## 2019-04-01 ENCOUNTER — Encounter: Payer: Self-pay | Admitting: Specialist

## 2019-04-01 ENCOUNTER — Ambulatory Visit: Payer: Medicare Other

## 2019-04-01 VITALS — BP 141/81 | HR 71 | Ht 63.0 in | Wt 351.0 lb

## 2019-04-01 DIAGNOSIS — Z981 Arthrodesis status: Secondary | ICD-10-CM | POA: Diagnosis not present

## 2019-04-01 DIAGNOSIS — M4726 Other spondylosis with radiculopathy, lumbar region: Secondary | ICD-10-CM | POA: Diagnosis not present

## 2019-04-01 DIAGNOSIS — M5417 Radiculopathy, lumbosacral region: Secondary | ICD-10-CM | POA: Diagnosis not present

## 2019-04-01 NOTE — Patient Instructions (Addendum)
Plan: Avoid bending, stooping and avoid lifting weights greater than 10 lbs.  Surgery scheduling secretary Kandice Hams, will call you in the next week to schedule for surgery.  Surgery recommended is a right L5-S1 foramenotomy this would be done with a microscope. Take hydrocodone for for pain. Risk of surgery includes risk of infection 1 in 200 patients, bleeding 1/2% chance you would need a transfusion.   Risk to the nerves is one in 10,000.  Expect improved walking and standing tolerance. Expect relief of leg pain but numbness may persist depending on the length and degree of pressure that has been present.

## 2019-04-01 NOTE — Progress Notes (Signed)
Office Visit Note   Patient: Marilyn [name]           Date of Birth: March 26, 1971           MRN: [id number] Visit Date: 04/01/2019              Requested by: Joette CatchingNyland, Leonard, MD [address] [place],  [place] [id number] PCP: Joette CatchingNyland, Leonard, MD   Assessment & Plan: Visit Diagnoses:  1. Status post lumbar spinal fusion   2. Lumbosacral radiculopathy   3. Other spondylosis with radiculopathy, lumbar region     Plan: Avoid bending, stooping and avoid lifting weights greater than 10 lbs.  Surgery scheduling secretary Tivis RingerSherri Billings, will call you in the next week to schedule for surgery.  Surgery recommended is a right L5-S1 foramenotomy this would be done with a microscope. Take hydrocodone for for pain. Risk of surgery includes risk of infection 1 in 200 patients, bleeding 1/2% chance you would need a transfusion.   Risk to the nerves is one in 10,000.  Expect improved walking and standing tolerance. Expect relief of leg pain but numbness may persist depending on the length and degree of pressure that has been present.    Follow-Up Instructions: No follow-ups on file.   Orders:  Orders Placed This Encounter  Procedures  . XR Lumbar Spine 2-3 Views   No orders of the defined types were placed in this encounter.     Procedures: No procedures performed   Clinical Data: No additional findings.   Subjective: Chief Complaint  Patient presents with  . Right Leg - Pain    48 year old female with history of lumbar fusion L4-5 and L5-S1. There is burning sensation in the right leg and now there is burning pain into the left leg over the last 1 1/2 weeks. No change in her activity, her weight is fluctates some up and some down. No bowel or bladder difficulty. She is some days better and some days she feels sluggish like she is carring something heavy on her back. She still takes hydrocodone about one a day and is not able to stop the use of the pain meds completely.  Two areas on either side of the incisions are still lumps and are uncomfortable and the right side is uncomfortable more than the left side. The strength in the legs is no weak.  There is numbness sometimes in the right leg on the right posterolateral thigh towards the knee distal thigh. Bending and stooping slightly with difficulty. She is squatting to pick up items she drops.    Review of Systems  Constitutional: Negative.   HENT: Negative.   Eyes: Negative.   Respiratory: Negative.   Cardiovascular: Negative.   Gastrointestinal: Negative.   Endocrine: Negative.   Genitourinary: Negative.   Musculoskeletal: Negative.   Skin: Negative.   Allergic/Immunologic: Negative.   Neurological: Negative.   Hematological: Negative.   Psychiatric/Behavioral: Negative.      Objective: Vital Signs: BP (!) 141/81 (BP Location: Left Arm, Patient Position: Sitting)   Pulse 71   Ht 5\' 3"  (1.6 m)   Wt (!) 351 lb (159.2 kg)   BMI 62.18 kg/m   Physical Exam Constitutional:      Appearance: She is well-developed.  HENT:     Head: Normocephalic and atraumatic.  Eyes:     Pupils: Pupils are equal, round, and reactive to light.  Neck:     Musculoskeletal: Normal range of motion and neck supple.  Pulmonary:  Effort: Pulmonary effort is normal.     Breath sounds: Normal breath sounds.  Abdominal:     General: Bowel sounds are normal.     Palpations: Abdomen is soft.  Skin:    General: Skin is warm and dry.  Neurological:     Mental Status: She is alert and oriented to person, place, and time.  Psychiatric:        Behavior: Behavior normal.        Thought Content: Thought content normal.        Judgment: Judgment normal.     Back Exam   Tenderness  The patient is experiencing tenderness in the lumbar.  Range of Motion  Extension: abnormal  Flexion: abnormal  Lateral bend right: abnormal  Lateral bend left: abnormal  Rotation right: abnormal  Rotation left: abnormal    Muscle Strength  Right Quadriceps:  5/5  Left Quadriceps:  5/5  Right Hamstrings:  5/5   Tests  Straight leg raise right: negative Straight leg raise left: negative  Reflexes  Patellar: 2/4 Achilles: 2/4 Biceps: normal Babinski's sign: normal   Other  Toe walk: normal Heel walk: normal Sensation: normal Gait: normal  Erythema: no back redness Scars: absent      Specialty Comments:  No specialty comments available.  Imaging: Xr Lumbar Spine 2-3 Views  Result Date: 04/01/2019 AP and lateral flexion and extension radiographs of the lumbar spine show hardware, pedicle screws and rods fixing L4 to S1 with hardware without abnormality,no lucency or loosening. Similar points of measurement suggest no change is measurement over the anterior superior margin of L4 to S1 with flexion and extension.    PMFS History: Patient Active Problem List   Diagnosis Date Noted  . Anemia due to blood loss, acute 06/18/2018    Priority: High    Class: Acute  . Superficial incisional surgical site infection 06/11/2018    Priority: High    Class: Acute  . Right ankle effusion 06/11/2018    Priority: High    Class: Acute  . Morbid (severe) obesity due to excess calories ([place]) 05/29/2018    Priority: High    Class: Chronic  . Spondylolisthesis, lumbar region 05/27/2018    Priority: High    Class: Chronic  . Spinal stenosis of lumbar region 05/27/2018    Priority: High    Class: Chronic  . Urinary tract infection 05/27/2018    Priority: High    Class: Acute  . Plantar fasciitis of left foot 06/16/2018    Priority: Medium    Class: Chronic  . Tendonitis, Achilles, right 06/16/2018    Priority: Medium    Class: Chronic  . Osteochondral talar dome lesion 06/16/2018    Priority: Medium    Class: Chronic  . Deep incisional surgical site infection   . Fusion of spine of lumbar region 05/27/2018  . Pain of right hip joint 07/03/2017  . Acute right-sided low back pain with  right-sided sciatica 03/27/2017  . Chronic right shoulder pain 02/13/2017  . History of rotator cuff tear 11/30/2016  . Impingement syndrome of right shoulder 11/30/2016  . Exacerbation of asthma 07/18/2016  . Hypokalemia 07/18/2016  . Hypertension 07/18/2016  . Depression with anxiety 07/18/2016  . Hypothyroidism 07/18/2016  . Hyperglycemia 07/18/2016  . Acute asthma exacerbation 07/18/2016  . Surgical wound dehiscence left hip; questionable superficial infection 11/10/2015  . Left hip postoperative wound infection 11/10/2015  . Osteoarthritis of left hip 09/09/2015  . Status post total replacement of left hip  09/09/2015  . Obesity (BMI 35.0-39.9 without comorbidity) 04/28/2013   Past Medical History:  Diagnosis Date  . Anemia    taking iron now. pt states having no current issues 09/02/2015  . Anginal pain (HCC)    pt states experiences chest wall pain pt states related to her asthma   . Anxiety    with MRI's  . Arthritis    Everywhere  . Asthma   . Dizziness   . GERD (gastroesophageal reflux disease)   . Headache(784.0)    HX OF MIGRAINES  . History of bronchitis   . Hypertension   . Hypothyroidism    takes levothyroxen  . OSA on CPAP    wears cpap  . Shortness of breath    with exertion  . Wears glasses     Family History  Problem Relation Age of Onset  . Cancer Mother        colon  . Epilepsy Mother   . Cancer Father        prostate  . Diabetes Father   . Hypertension Father   . Hypertension Maternal Aunt   . Diabetes Maternal Aunt   . Hypertension Paternal Aunt     Past Surgical History:  Procedure Laterality Date  . CESAREAN SECTION     times 2  . CHOLECYSTECTOMY    . ENDOMETRIAL ABLATION    . INCISION AND DRAINAGE HIP Left 11/10/2015   Procedure: IRRIGATION AND DEBRIDEMENT LEFT HIP INCISION;  Surgeon: Kathryne Hitchhristopher Y Blackman, MD;  Location: [facility] OR;  Service: Orthopedics;  Laterality: Left;  . JOINT REPLACEMENT  2011   total left knee  . KNEE  ARTHROPLASTY  04/23/2012   right   . KNEE ARTHROSCOPY    . LUMBAR WOUND DEBRIDEMENT N/A 06/11/2018   Procedure: LUMBAR WOUND DEBRIDEMENT;  Surgeon: Kerrin ChampagneNitka,  E, MD;  [facility];  Service: Orthopedics;  Laterality: N/A;  . RADIOLOGY WITH ANESTHESIA N/A 09/09/2018   Procedure: LUMBER SPINE WITHOUT CONTRAST;  Surgeon: Radiologist, Medication, MD;  Location: [facility] OR;  Service: Radiology;  Laterality: N/A;  . ROTATOR CUFF REPAIR     left   . SHOULDER SURGERY     right to repair ligament tear  . TOTAL HIP ARTHROPLASTY Left 09/09/2015   Procedure: LEFT TOTAL HIP ARTHROPLASTY ANTERIOR APPROACH;  Surgeon: Kathryne Hitchhristopher Y Blackman, MD;  Location: [facility];  Service: Orthopedics;  Laterality: Left;  . TOTAL KNEE ARTHROPLASTY  04/23/2012   Procedure: TOTAL KNEE ARTHROPLASTY;  Surgeon: Nadara MustardMarcus V Duda, MD;  Location: [facility] OR;  Service: Orthopedics;  Laterality: Right;  Right Total Knee Arthroplasty  . TUBAL LIGATION  1996   Social History   Occupational History  . Not on file  Tobacco Use  . Smoking status: Former Smoker    Packs/day: 0.50    Years: 4.00    Pack years: 2.00    Quit date: 09/18/1991    Years since quitting: 27.5  . Smokeless tobacco: Never Used  Substance and Sexual Activity  . Alcohol use: No  . Drug use: No  . Sexual activity: Yes    Birth control/protection: Surgical

## 2019-04-08 ENCOUNTER — Other Ambulatory Visit (HOSPITAL_COMMUNITY): Payer: Self-pay | Admitting: Adult Health Nurse Practitioner

## 2019-04-08 DIAGNOSIS — Z1231 Encounter for screening mammogram for malignant neoplasm of breast: Secondary | ICD-10-CM

## 2019-04-10 ENCOUNTER — Other Ambulatory Visit: Payer: Self-pay | Admitting: Radiology

## 2019-04-10 MED ORDER — TIZANIDINE HCL 4 MG PO TABS: 4.0000 mg | ORAL_TABLET | Freq: Four times a day (QID) | ORAL | 0 refills | Status: DC | PRN

## 2019-04-20 ENCOUNTER — Telehealth: Payer: Self-pay

## 2019-04-20 IMAGING — XA IR PICC >5YO
1 series · 1 of 1 positions shown · IV contrast (agent unspecified)
Comparison: none

INDICATION: Poor venous access. In need of durable intravenous access for
antibiotic administration.

EXAM:
ULTRASOUND AND FLUOROSCOPIC GUIDED PICC LINE INSERTION
MEDICATIONS:
None.
CONTRAST:  None
FLUOROSCOPY TIME:  54 seconds (3.3 mGy)
COMPLICATIONS:
None immediate.
TECHNIQUE: The procedure, risks, benefits, and alternatives were explained to
the patient and informed written consent was obtained. A timeout was
performed prior to the initiation of the procedure.

[Series 1: fl(-)  angio sharp · 1 of 1 slices shown]
[im 1/1]
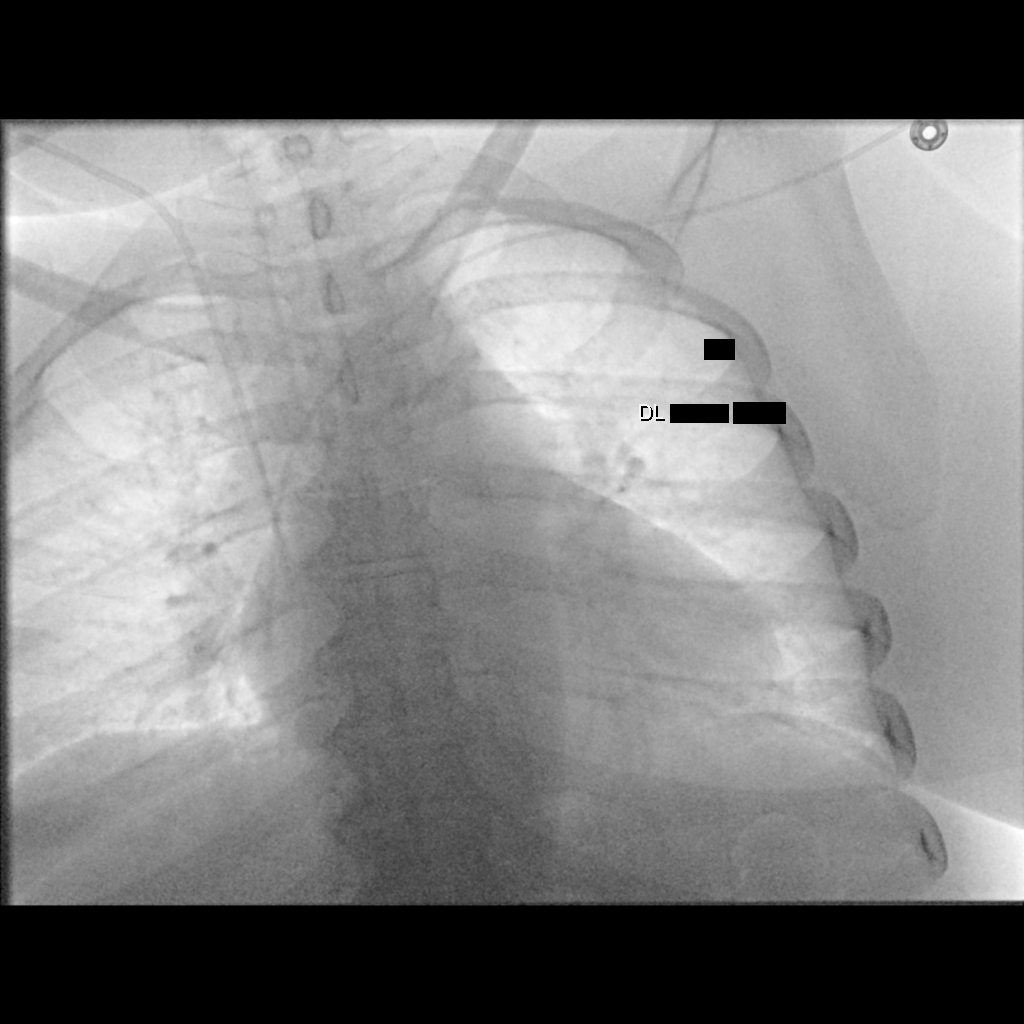

[1 of 1 positions shown; findings below may reference images not displayed]

The left upper extremity was prepped with chlorhexidine in a sterile
fashion, and a sterile drape was applied covering the operative
field. Maximum barrier sterile technique with sterile gowns and
gloves were used for the procedure. A timeout was performed prior to
the initiation of the procedure. Local anesthesia was provided with
1% lidocaine.

Under direct ultrasound guidance, the brachial vein was accessed
with a micropuncture kit after the overlying soft tissues were
anesthetized with 1% lidocaine.

After the overlying soft tissues were anesthetized, a small venotomy
incision was created and a micropuncture kit was utilized to access
the left brachial vein. Real-time ultrasound guidance was utilized
for vascular access including the acquisition of a permanent
ultrasound image documenting patency of the accessed vessel.

A guidewire was advanced to the level of the superior caval-atrial
junction for measurement purposes and the PICC line was cut to
length. A peel-away sheath was placed and a 47 cm, 5 French, dual
lumen was inserted to level of the superior caval-atrial junction. A
post procedure spot fluoroscopic was obtained. The catheter easily
aspirated and flushed and was sutured in place. A dressing was
placed. The patient tolerated the procedure well without immediate
post procedural complication.
FINDINGS: After catheter placement, the tip lies within the superior
cavoatrial junction. The catheter aspirates and flushes normally and
is ready for immediate use.
IMPRESSION: Successful ultrasound and fluoroscopic guided placement of a left
brachial vein approach, 47 cm, 5 French, dual lumen PICC with tip at
the superior caval-atrial junction. The PICC line is ready for
immediate use.

## 2019-04-20 NOTE — Telephone Encounter (Signed)
Patient called inquiring about scheduling surgery.  Need surgery sheet.  Thanks! 

## 2019-04-21 ENCOUNTER — Other Ambulatory Visit: Payer: Self-pay | Admitting: Radiology

## 2019-04-22 MED ORDER — TIZANIDINE HCL 4 MG PO TABS: 4.0000 mg | ORAL_TABLET | Freq: Four times a day (QID) | ORAL | 0 refills | Status: DC | PRN

## 2019-04-22 NOTE — Telephone Encounter (Signed)
Gave sheet to dr Louanne Skye

## 2019-05-11 ENCOUNTER — Ambulatory Visit (HOSPITAL_COMMUNITY)
Admission: RE | Admit: 2019-05-11 | Discharge: 2019-05-11 | Disposition: A | Discharge status: Home / Self Care | Payer: Medicare Other | Source: Ambulatory Visit | Attending: Adult Health Nurse Practitioner | Admitting: Adult Health Nurse Practitioner

## 2019-05-11 ENCOUNTER — Other Ambulatory Visit: Payer: Self-pay

## 2019-05-11 DIAGNOSIS — Z1231 Encounter for screening mammogram for malignant neoplasm of breast: Secondary | ICD-10-CM | POA: Diagnosis present

## 2019-05-20 ENCOUNTER — Observation Stay (HOSPITAL_COMMUNITY): Payer: Medicare Other

## 2019-05-20 ENCOUNTER — Observation Stay (HOSPITAL_COMMUNITY)
Admission: EM | Admit: 2019-05-20 | Discharge: 2019-05-22 | Disposition: A | Discharge status: Home / Self Care | Payer: Medicare Other | Attending: Family Medicine | Admitting: Family Medicine

## 2019-05-20 ENCOUNTER — Encounter (HOSPITAL_COMMUNITY): Payer: Self-pay

## 2019-05-20 ENCOUNTER — Emergency Department (HOSPITAL_COMMUNITY): Payer: Medicare Other

## 2019-05-20 ENCOUNTER — Other Ambulatory Visit: Payer: Self-pay

## 2019-05-20 DIAGNOSIS — M25551 Pain in right hip: Secondary | ICD-10-CM

## 2019-05-20 DIAGNOSIS — Y999 Unspecified external cause status: Secondary | ICD-10-CM | POA: Diagnosis not present

## 2019-05-20 DIAGNOSIS — Z79899 Other long term (current) drug therapy: Secondary | ICD-10-CM | POA: Diagnosis not present

## 2019-05-20 DIAGNOSIS — I10 Essential (primary) hypertension: Secondary | ICD-10-CM | POA: Insufficient documentation

## 2019-05-20 DIAGNOSIS — Y92009 Unspecified place in unspecified non-institutional (private) residence as the place of occurrence of the external cause: Secondary | ICD-10-CM | POA: Insufficient documentation

## 2019-05-20 DIAGNOSIS — R55 Syncope and collapse: Secondary | ICD-10-CM | POA: Diagnosis present

## 2019-05-20 DIAGNOSIS — S93402A Sprain of unspecified ligament of left ankle, initial encounter: Secondary | ICD-10-CM | POA: Diagnosis not present

## 2019-05-20 DIAGNOSIS — W1839XA Other fall on same level, initial encounter: Secondary | ICD-10-CM | POA: Diagnosis not present

## 2019-05-20 DIAGNOSIS — J45909 Unspecified asthma, uncomplicated: Secondary | ICD-10-CM | POA: Diagnosis not present

## 2019-05-20 DIAGNOSIS — R197 Diarrhea, unspecified: Secondary | ICD-10-CM | POA: Insufficient documentation

## 2019-05-20 DIAGNOSIS — Z20828 Contact with and (suspected) exposure to other viral communicable diseases: Secondary | ICD-10-CM | POA: Diagnosis not present

## 2019-05-20 DIAGNOSIS — Z87891 Personal history of nicotine dependence: Secondary | ICD-10-CM | POA: Diagnosis not present

## 2019-05-20 DIAGNOSIS — A0472 Enterocolitis due to Clostridium difficile, not specified as recurrent: Secondary | ICD-10-CM | POA: Diagnosis present

## 2019-05-20 DIAGNOSIS — R112 Nausea with vomiting, unspecified: Secondary | ICD-10-CM | POA: Diagnosis not present

## 2019-05-20 DIAGNOSIS — G4733 Obstructive sleep apnea (adult) (pediatric): Secondary | ICD-10-CM

## 2019-05-20 DIAGNOSIS — F418 Other specified anxiety disorders: Secondary | ICD-10-CM | POA: Diagnosis present

## 2019-05-20 DIAGNOSIS — E876 Hypokalemia: Principal | ICD-10-CM | POA: Insufficient documentation

## 2019-05-20 DIAGNOSIS — K529 Noninfective gastroenteritis and colitis, unspecified: Secondary | ICD-10-CM | POA: Diagnosis present

## 2019-05-20 DIAGNOSIS — E039 Hypothyroidism, unspecified: Secondary | ICD-10-CM | POA: Diagnosis present

## 2019-05-20 DIAGNOSIS — Y9301 Activity, walking, marching and hiking: Secondary | ICD-10-CM | POA: Insufficient documentation

## 2019-05-20 LAB — MAGNESIUM: Magnesium: 1.7 mg/dL (ref 1.7–2.4)

## 2019-05-20 LAB — URINALYSIS, ROUTINE W REFLEX MICROSCOPIC
Bilirubin Urine: NEGATIVE
Glucose, UA: NEGATIVE mg/dL
Hgb urine dipstick: NEGATIVE
Ketones, ur: 20 mg/dL — AB
Leukocytes,Ua: NEGATIVE
Nitrite: NEGATIVE
Protein, ur: 30 mg/dL — AB
Specific Gravity, Urine: 1.023 (ref 1.005–1.030)
pH: 5 (ref 5.0–8.0)

## 2019-05-20 LAB — CBC WITH DIFFERENTIAL/PLATELET
Abs Immature Granulocytes: 0.05 10*3/uL (ref 0.00–0.07)
Basophils Absolute: 0 10*3/uL (ref 0.0–0.1)
Basophils Relative: 0 %
Eosinophils Absolute: 0.2 10*3/uL (ref 0.0–0.5)
Eosinophils Relative: 2 %
HCT: 38.5 % (ref 36.0–46.0)
Hemoglobin: 11.7 g/dL — ABNORMAL LOW (ref 12.0–15.0)
Immature Granulocytes: 1 %
Lymphocytes Relative: 28 %
Lymphs Abs: 1.9 10*3/uL (ref 0.7–4.0)
MCH: 28.1 pg (ref 26.0–34.0)
MCHC: 30.4 g/dL (ref 30.0–36.0)
MCV: 92.5 fL (ref 80.0–100.0)
Monocytes Absolute: 0.3 10*3/uL (ref 0.1–1.0)
Monocytes Relative: 5 %
Neutro Abs: 4.2 10*3/uL (ref 1.7–7.7)
Neutrophils Relative %: 64 %
Platelets: 292 10*3/uL (ref 150–400)
RBC: 4.16 MIL/uL (ref 3.87–5.11)
RDW: 13.4 % (ref 11.5–15.5)
WBC: 6.7 10*3/uL (ref 4.0–10.5)
nRBC: 0 % (ref 0.0–0.2)

## 2019-05-20 LAB — COMPREHENSIVE METABOLIC PANEL
ALT: 36 U/L (ref 0–44)
AST: 37 U/L (ref 15–41)
Albumin: 4.1 g/dL (ref 3.5–5.0)
Alkaline Phosphatase: 77 U/L (ref 38–126)
Anion gap: 12 (ref 5–15)
BUN: 13 mg/dL (ref 6–20)
CO2: 25 mmol/L (ref 22–32)
Calcium: 9.4 mg/dL (ref 8.9–10.3)
Chloride: 102 mmol/L (ref 98–111)
Creatinine, Ser: 1.06 mg/dL — ABNORMAL HIGH (ref 0.44–1.00)
GFR calc Af Amer: >60 mL/min (ref >60)
GFR calc non Af Amer: >60 mL/min (ref >60)
Glucose, Bld: 103 mg/dL — ABNORMAL HIGH (ref 70–99)
Potassium: 2.6 mmol/L — CL (ref 3.5–5.1)
Sodium: 139 mmol/L (ref 135–145)
Total Bilirubin: 0.9 mg/dL (ref 0.3–1.2)
Total Protein: 8 g/dL (ref 6.5–8.1)

## 2019-05-20 LAB — LIPASE, BLOOD: Lipase: 16 U/L (ref 11–51)

## 2019-05-20 LAB — PHOSPHORUS: Phosphorus: 1.5 mg/dL — ABNORMAL LOW (ref 2.5–4.6)

## 2019-05-20 LAB — SARS CORONAVIRUS 2 BY RT PCR (HOSPITAL ORDER, PERFORMED IN ~~LOC~~ HOSPITAL LAB): SARS Coronavirus 2: NEGATIVE

## 2019-05-20 MED ORDER — SODIUM CHLORIDE 0.9% FLUSH
3.0000 mL | Freq: Two times a day (BID) | INTRAVENOUS | Status: DC
  Administered 2019-05-20 – 2019-05-22 (×2): 3 mL via INTRAVENOUS

## 2019-05-20 MED ORDER — ONDANSETRON HCL 4 MG/2ML IJ SOLN
4.0000 mg | Freq: Once | INTRAMUSCULAR | Status: AC
  Administered 2019-05-20: 19:00:00 4 mg via INTRAVENOUS
  Filled 2019-05-20: qty 2

## 2019-05-20 MED ORDER — ACETAMINOPHEN 650 MG RE SUPP: 650.0000 mg | Freq: Four times a day (QID) | RECTAL | Status: DC | PRN

## 2019-05-20 MED ORDER — POTASSIUM CHLORIDE 10 MEQ/100ML IV SOLN
10.0000 meq | INTRAVENOUS | Status: AC
  Administered 2019-05-20 (×3): 10 meq via INTRAVENOUS
  Filled 2019-05-20 (×3): qty 100

## 2019-05-20 MED ORDER — POTASSIUM CHLORIDE IN NACL 40-0.9 MEQ/L-% IV SOLN
INTRAVENOUS | Status: AC
  Administered 2019-05-21 (×2): 125 mL/h via INTRAVENOUS
  Filled 2019-05-20 (×2): qty 1000

## 2019-05-20 MED ORDER — SODIUM CHLORIDE 0.9 % IV BOLUS
1000.0000 mL | Freq: Once | INTRAVENOUS | Status: AC
  Administered 2019-05-20: 19:00:00 1000 mL via INTRAVENOUS

## 2019-05-20 MED ORDER — ACETAMINOPHEN 325 MG PO TABS: 650.0000 mg | ORAL_TABLET | Freq: Four times a day (QID) | ORAL | Status: DC | PRN

## 2019-05-20 MED ORDER — ENOXAPARIN SODIUM 80 MG/0.8ML ~~LOC~~ SOLN
80.0000 mg | SUBCUTANEOUS | Status: DC
  Administered 2019-05-20 – 2019-05-21 (×2): 80 mg via SUBCUTANEOUS
  Filled 2019-05-20 (×2): qty 0.8

## 2019-05-20 MED ORDER — MAGNESIUM SULFATE 2 GM/50ML IV SOLN
2.0000 g | Freq: Once | INTRAVENOUS | Status: AC
  Administered 2019-05-20: 2 g via INTRAVENOUS
  Filled 2019-05-20: qty 50

## 2019-05-20 MED ORDER — PROCHLORPERAZINE EDISYLATE 10 MG/2ML IJ SOLN: 5.0000 mg | INTRAMUSCULAR | Status: DC | PRN

## 2019-05-20 MED ORDER — ENOXAPARIN SODIUM 40 MG/0.4ML ~~LOC~~ SOLN: 40.0000 mg | SUBCUTANEOUS | Status: DC

## 2019-05-20 NOTE — ED Provider Notes (Signed)
[facility] EMERGENCY DEPARTMENT Provider Note   CSN: [id number] Arrival date & time: 05/20/19  1816     History   Chief Complaint Chief Complaint  Patient presents with  . Loss of Consciousness    HPI Marilyn [name] is a 48 y.o. female with past medical history of GERD, hypertension,  asthma, hypothyroidism, presenting to the emergency department with complaint of 3 days of nausea, vomiting, and diarrhea.  She states she had a chicken sandwich on Saturday and felt a little bit nauseous in the evening, however began having vomiting and diarrhea the subsequent day on Sunday.  She has had about 2-3 episodes of vomiting per day, nonbloody nonbilious.  She also reports multiple episodes of watery diarrhea.  She states today she went to get up really quickly because she thought she was going to vomit, however she did not make it to the trash can because she had a syncopal episode due to lightheadedness.  She did not hit her head.  She has no headache or vision changes now.  She states she injured her left ankle that is worse on the lateral aspect.  She also is having some pain up to her left knee where she had a total knee replacement.  No medications taken for pain.  She did take a little bit of Imodium, however stopped.   ,   The history is provided by the patient.    Past Medical History:  Diagnosis Date  . Anemia    taking iron now. pt states having no current issues 09/02/2015  . Anginal pain ([place])    pt states experiences chest wall pain pt states related to her asthma   . Anxiety    with MRI's  . Arthritis    Everywhere  . Asthma   . Dizziness   . GERD (gastroesophageal reflux disease)   . Headache(784.0)    HX OF MIGRAINES  . History of bronchitis   . Hypertension   . Hypothyroidism    takes levothyroxen  . OSA on CPAP    wears cpap  . Shortness of breath    with exertion  . Wears glasses     Patient Active Problem List   Diagnosis Date Noted  . Syncope  05/20/2019  . Anemia due to blood loss, acute 06/18/2018    Class: Acute  . Plantar fasciitis of left foot 06/16/2018    Class: Chronic  . Tendonitis, Achilles, right 06/16/2018    Class: Chronic  . Osteochondral talar dome lesion 06/16/2018    Class: Chronic  . Deep incisional surgical site infection   . Superficial incisional surgical site infection 06/11/2018    Class: Acute  . Right ankle effusion 06/11/2018    Class: Acute  . Morbid (severe) obesity due to excess calories ([place]) 05/29/2018    Class: Chronic  . Spondylolisthesis, lumbar region 05/27/2018    Class: Chronic  . Spinal stenosis of lumbar region 05/27/2018    Class: Chronic  . Urinary tract infection 05/27/2018    Class: Acute  . Fusion of spine of lumbar region 05/27/2018  . Pain of right hip joint 07/03/2017  . Acute right-sided low back pain with right-sided sciatica 03/27/2017  . Chronic right shoulder pain 02/13/2017  . History of rotator cuff tear 11/30/2016  . Impingement syndrome of right shoulder 11/30/2016  . Exacerbation of asthma 07/18/2016  . Hypokalemia 07/18/2016  . Hypertension 07/18/2016  . Depression with anxiety 07/18/2016  . Hypothyroidism 07/18/2016  . Hyperglycemia 07/18/2016  .  Acute asthma exacerbation 07/18/2016  . Surgical wound dehiscence left hip; questionable superficial infection 11/10/2015  . Left hip postoperative wound infection 11/10/2015  . Osteoarthritis of left hip 09/09/2015  . 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 Obesity (BMI 35.0-39.9 without comorbidity) 04/28/2013    Past Surgical History:  Procedure Laterality Date  . CESAREAN SECTION     times 2  . CHOLECYSTECTOMY    . ENDOMETRIAL ABLATION    . INCISION AND DRAINAGE HIP Left 11/10/2015   Procedure: IRRIGATION AND DEBRIDEMENT LEFT HIP INCISION;  Surgeon: Mcarthur Rossetti, MD;  Location: [place];  Service: Orthopedics;  Laterality: Left;  . JOINT REPLACEMENT  2011   total left knee  .  KNEE ARTHROPLASTY  04/23/2012   right   . KNEE ARTHROSCOPY    . LUMBAR WOUND DEBRIDEMENT N/A 06/11/2018   Procedure: LUMBAR WOUND DEBRIDEMENT;  Surgeon: Jessy Oto, MD;  Location: [place];  Service: Orthopedics;  Laterality: N/A;  . RADIOLOGY WITH ANESTHESIA N/A 09/09/2018   Procedure: LUMBER SPINE WITHOUT CONTRAST;  Surgeon: Radiologist, Medication, MD;  Location: [place];  Service: Radiology;  Laterality: N/A;  . ROTATOR CUFF REPAIR     left   . SHOULDER SURGERY     right to repair ligament tear  . TOTAL HIP ARTHROPLASTY Left 09/09/2015   Procedure: LEFT TOTAL HIP ARTHROPLASTY ANTERIOR APPROACH;  Surgeon: Mcarthur Rossetti, MD;  Location: [facility];  Service: Orthopedics;  Laterality: Left;  . TOTAL KNEE ARTHROPLASTY  04/23/2012   Procedure: TOTAL KNEE ARTHROPLASTY;  Surgeon: Newt Minion, MD;  Location: [place];  Service: Orthopedics;  Laterality: Right;  Right Total Knee Arthroplasty  . TUBAL LIGATION  1996     OB History    Gravida  2   Para  2   Term  2   Preterm      AB      Living  2     SAB      TAB      Ectopic      Multiple      Live Births               Home Medications    Prior to Admission medications   Medication Sig Start Date End Date Taking? Authorizing Provider  albuterol (PROVENTIL HFA;VENTOLIN HFA) 108 (90 BASE) MCG/ACT inhaler Inhale 2 puffs into the lungs every 6 (six) hours as needed for wheezing or shortness of breath.    [provider]  albuterol (PROVENTIL) (2.5 MG/3ML) 0.083% nebulizer solution Take 3 mLs (2.5 mg total) by nebulization 3 (three) times daily. 07/19/16   Thurnell Lose, MD  allopurinol (ZYLOPRIM) 100 MG tablet Take 1 tablet (100 mg total) by mouth 2 (two) times daily. 02/23/19   Jessy Oto, MD  ALPRAZolam Duanne Moron) 1 MG tablet take 1 tablet 1 hour prior to MRI repeat 30  Ins prior if needed 08/06/18   Jessy Oto, MD  amoxicillin-clavulanate (AUGMENTIN) 875-125 MG tablet Take 1 tablet po  2 hours prior to  procedure and 1 tablet po 8 hours after procedure 10/08/18   Jessy Oto, MD  cetirizine (ZYRTEC) 10 MG tablet Take 10 mg by mouth daily.     [provider]  diclofenac sodium (VOLTAREN) 1 % GEL Apply 4 g topically 4 (four) times daily. Patient taking differently: Apply 4 g topically 4 (four) times daily as needed (pain).  06/19/18   Jessy Oto, MD  diphenhydrAMINE (BENADRYL) 25 MG tablet Take 1 tablet (25 mg total) by mouth every 6 (six) hours. Patient taking differently: Take 25 mg by mouth every 6 (six) hours as needed for itching or allergies.  09/29/15   Jola Schmidt, MD  diphenhydrAMINE HCl, Sleep, (SOBA NIGHTTIME SLEEP AID) 50 MG CAPS Take 50 mg by mouth at bedtime.    [provider]  docusate sodium (COLACE) 100 MG capsule Take 1 capsule (100 mg total) by mouth 2 (two) times daily. 05/30/18   Jessy Oto, MD  ergocalciferol (VITAMIN D2) 50000 units capsule Take 50,000 Units by mouth every Monday.     [provider]  ferrous gluconate (FERGON) 324 MG tablet Take 1 tablet (324 mg total) by mouth 2 (two) times daily with a meal. 05/30/18   Jessy Oto, MD  furosemide (LASIX) 20 MG tablet Take 20 mg by mouth daily as needed for fluid.  11/01/17   [provider]  gabapentin (NEURONTIN) 300 MG capsule Take two capsules at night qhs and one in the am and one with noon meal 02/06/19   Jessy Oto, MD  hydrochlorothiazide 25 MG tablet Take 25 mg by mouth daily.     [provider]  levothyroxine (SYNTHROID, LEVOTHROID) 137 MCG tablet Take 137 mcg by mouth daily before breakfast.  05/31/17   [provider]  NARCAN 4 MG/0.1ML LIQD nasal spray kit Place 0.4 mg into the nose once.  06/28/17   [provider]  oxybutynin (DITROPAN-XL) 10 MG 24 hr tablet Take 10 mg by mouth daily.  05/31/17   [provider]  pantoprazole (PROTONIX) 20 MG tablet Take 1 tablet (20 mg total) by mouth 2 (two) times daily before a  meal. Patient taking differently: Take 20 mg by mouth 2 (two) times daily before a meal. prn 03/13/18   Virgel Manifold, MD  potassium chloride (K-DUR,KLOR-CON) 10 MEQ tablet Take 10 mEq by mouth 2 (two) times daily.     [provider]  tiZANidine (ZANAFLEX) 4 MG tablet Take 1 tablet (4 mg total) by mouth every 6 (six) hours as needed for muscle spasms. 04/22/19   Jessy Oto, MD  vitamin C (ASCORBIC ACID) 500 MG tablet Take 500 mg by mouth daily.    [provider]    Family History Family History  Problem Relation Age of Onset  . Cancer Mother        colon  . Epilepsy Mother   . Cancer Father        prostate  . Diabetes Father   . Hypertension Father   . Hypertension Maternal Aunt   . Diabetes Maternal Aunt   . Hypertension Paternal Aunt     Social History Social History   Tobacco Use  . Smoking status: Former Smoker    Packs/day: 0.50    Years: 4.00    Pack years: 2.00    Quit date: 09/18/1991    Years since quitting: 27.6  . Smokeless tobacco: Never Used  Substance Use Topics  . Alcohol use: No  . Drug use: No     Allergies   Lisinopril, Bee venom, Propoxyphene, Codeine, Latex, Meloxicam, Morphine and related, and Tomato   Review of Systems Review of Systems  Constitutional: Negative for fever.  HENT: Negative for sore throat.   Eyes: Negative for visual disturbance.  Respiratory: Negative for cough and shortness of breath.   Gastrointestinal: Positive for diarrhea, nausea and vomiting.  Genitourinary: Negative for dysuria and frequency.  Musculoskeletal: Positive for arthralgias.  Neurological: Positive for syncope. Negative for headaches.  All other systems reviewed and are negative.    Physical Exam Updated Vital Signs BP 101/84   Pulse 66   Temp 98.1 F (36.7 C) (Oral)   Resp 16   Ht _0  (1.6 m)   Wt (!) 158.3 kg   SpO2 97%   BMI 61.82 kg/m   Physical Exam Vitals signs and nursing note reviewed.  Constitutional:       General: She is not in acute distress.    Appearance: She is well-developed. She is obese. She is not ill-appearing.  HENT:     Head: Normocephalic and atraumatic.  Eyes:     Extraocular Movements: Extraocular movements intact.     Conjunctiva/sclera: Conjunctivae normal.     Pupils: Pupils are equal, round, and reactive to light.  Cardiovascular:     Rate and Rhythm: Normal rate and regular rhythm.  Pulmonary:     Effort: Pulmonary effort is normal. No respiratory distress.     Breath sounds: Normal breath sounds.  Abdominal:     General: Bowel sounds are normal.     Palpations: Abdomen is soft.     Tenderness: There is abdominal tenderness (mild) in the epigastric area. There is no guarding or rebound.  Musculoskeletal:     Comments: Left ankle and foot without deformity or swelling.  There is tenderness present to the lateral aspect of the ankle and dorsal foot.  Pain with range of motion.  There is some tenderness to the anterior aspect of left knee.  No swelling or deformity.  Skin:    General: Skin is warm.  Neurological:     Mental Status: She is alert.  Psychiatric:        Behavior: Behavior normal.      ED Treatments / Results  Labs (all labs ordered are listed, but only abnormal results are displayed) Labs Reviewed  COMPREHENSIVE METABOLIC PANEL - Abnormal; Notable for the following components:      Result Value   Potassium 2.6 (*)    Glucose, Bld 103 (*)    Creatinine, Ser 1.06 (*)    All other components within normal limits  CBC WITH DIFFERENTIAL/PLATELET - Abnormal; Notable for the following components:   Hemoglobin 11.7 (*)    All other components within normal limits  URINALYSIS, ROUTINE W REFLEX MICROSCOPIC - Abnormal; Notable for the following components:   Color, Urine AMBER (*)    APPearance HAZY (*)    Ketones, ur 20 (*)    Protein, ur 30 (*)    Bacteria, UA RARE (*)    All other components within normal limits  SARS CORONAVIRUS 2 (HOSPITAL ORDER,  [place] [facility])  LIPASE, BLOOD  MAGNESIUM  HIV ANTIBODY (ROUTINE TESTING W REFLEX)  BASIC METABOLIC PANEL  PHOSPHORUS    EKG EKG Interpretation  Date/Time:  Wednesday May 20 2019 18:28:32 EDT Ventricular Rate:  68 PR Interval:    QRS Duration: 99 QT Interval:  463 QTC Calculation: 493 R Axis:   45 Text Interpretation:  Sinus rhythm Low voltage, precordial leads Borderline prolonged QT interval similar to prior 9/19 Confirmed by Aletta Edouard [phone]) on 05/20/2019 6:35:48 PM   Radiology Dg Ankle Complete Left  Result Date: 05/20/2019 CLINICAL DATA:  Left ankle injury today due to a fall from a chair. Initial encounter. EXAM: LEFT ANKLE COMPLETE - 3+ VIEW COMPARISON:  None. FINDINGS: There is no evidence of fracture, dislocation,  or joint effusion. Mild midfoot osteoarthritis noted. Small plantar calcaneal spur is seen. Soft tissues are unremarkable. IMPRESSION: No acute abnormality. Electronically Signed   By: Inge Rise M.D.   On: 05/20/2019 20:00   Dg Knee Complete 4 Views Left  Result Date: 05/20/2019 CLINICAL DATA:  Left knee injury due to a fall from a chair today. Initial encounter. EXAM: LEFT KNEE - COMPLETE 4+ VIEW COMPARISON:  None. FINDINGS: No evidence of fracture, dislocation, or joint effusion. No evidence of arthropathy or other focal bone abnormality. Left knee arthroplasty is in place. Soft tissues are unremarkable. IMPRESSION: No acute finding. Electronically Signed   By: Inge Rise M.D.   On: 05/20/2019 20:01   Dg Foot Complete Left  Result Date: 05/20/2019 CLINICAL DATA:  Left foot injury due to a fall from a chair. Initial encounter. EXAM: LEFT FOOT - COMPLETE 3+ VIEW COMPARISON:  None. FINDINGS: There is no evidence of fracture or dislocation. Moderate first MTP osteoarthritis is seen. Mild midfoot osteoarthritis and small plantar calcaneal spur noted. Soft tissues are unremarkable. IMPRESSION: No acute finding. Electronically  Signed   By: Inge Rise M.D.   On: 05/20/2019 20:01    Procedures .Critical Care Performed by: Galileo Colello, [place] N, PA-C Authorized by: Aric Jost, [place] N, PA-C   Critical care provider statement:    Critical care time (minutes):  30   Critical care time was exclusive of:  Separately billable procedures and treating other patients and teaching time   Critical care was necessary to treat or prevent imminent or life-threatening deterioration of the following conditions:  Metabolic crisis   Critical care was time spent personally by me on the following activities:  Discussions with consultants, evaluation of patient's response to treatment, examination of patient, ordering and performing treatments and interventions, ordering and review of laboratory studies, ordering and review of radiographic studies, pulse oximetry, re-evaluation of patient's condition, obtaining history from patient or surrogate and review of old charts   I assumed direction of critical care for this patient from another provider in my specialty: no     (including critical care time)  Medications Ordered in ED Medications  potassium chloride 10 mEq in 100 mL IVPB (10 mEq Intravenous New Bag/Given 05/20/19 2058)  magnesium sulfate IVPB 2 g 50 mL (has no administration in time range)  0.9 % NaCl with KCl 40 mEq / L  infusion (has no administration in time range)  sodium chloride flush (NS) 0.9 % injection 3 mL (has no administration in time range)  acetaminophen (TYLENOL) tablet 650 mg (has no administration in time range)    Or  acetaminophen (TYLENOL) suppository 650 mg (has no administration in time range)  prochlorperazine (COMPAZINE) injection 5 mg (has no administration in time range)  enoxaparin (LOVENOX) injection 80 mg (has no administration in time range)  ondansetron (ZOFRAN) injection 4 mg (4 mg Intravenous Given 05/20/19 1913)  sodium chloride 0.9 % bolus 1,000 mL (1,000 mLs Intravenous New Bag/Given 05/20/19  1913)     Initial Impression / Assessment and Plan / ED Course  I have reviewed the triage vital signs and the nursing notes.  Pertinent labs & imaging results that were available during my care of the patient were reviewed by me and considered in my medical decision making (see chart for details).       Patient with nausea, vomiting, and diarrhea since Sunday.  Differential includes viral gastroenteritis versus foodborne illness.  She has no significant abdominal pain, little bit of epigastric  tenderness on exam.  She did have a syncopal episode today after standing up and feeling lightheaded when she felt like she was going to vomit.  Likely due to mild dehydration in the setting of vomiting and diarrhea.  She has an injury to her left ankle however has negative x-rays.  Suspect sprain.  Labs, IV fluids, antiemetics administered.  Will reevaluate.  Metabolic panel reveals significant hypokalemia of 2.6.  CBC is reassuring.  Lipase is within normal limits.  Magnesium level ordered. potassium replacement initiated.  Had shared decision making with patient, she elects for admission for further management of syncope in the setting of hypokalemia with nausea/vomiting/diarrhea.  Suspected viral gastritis versus foodborne illness.  She is on potassium supplementation and has been compliant.  Hospitalist consulted for admission, Dr. Olevia Bowens accepting.  The patient appears reasonably stabilized for admission considering the current resources, flow, and capabilities available in the ED at this time, and I doubt any other [facility] requiring further screening and/or treatment in the ED prior to admission.   Final Clinical Impressions(s) / ED Diagnoses   Final diagnoses:  Nausea vomiting and diarrhea  Hypokalemia  Syncope, unspecified syncope type  Sprain of left ankle, unspecified ligament, initial encounter    ED Discharge Orders    None       Andric Kerce, [place] N, PA-C 05/20/19 2148    Hayden Rasmussen, MD 05/27/19 1106

## 2019-05-20 NOTE — ED Triage Notes (Signed)
Pt last 3 days has had nausea vomiting and diarrhea. Afebrile with EMS. Pt was sitting in the chair and when she leaned over she blacked out and fell on the floor. Injury to left ankle. Unable to walk or put weight on left ankle. No deformity noted.

## 2019-05-20 NOTE — H&P (Signed)
History and Physical    Marilyn [name] VVO:[id number] DOB: 05-19-1971 DOA: 05/20/2019  PCP: Marilyn Hartshorn, NP   Patient coming from: Home.  I have personally briefly reviewed patient's old medical records in [place]  Chief Complaint: Nausea, vomiting and diarrhea  HPI: Marilyn [name] is a 48 y.o. female with medical history significant of iron deficiency anemia on ferrous sulfate maintenance, history of anginal pain, anxiety, osteoarthritis, asthma, dizziness, GERD, history of migraine headaches, hypertension, hypothyroidism, morbid obesity, OSA on CPAP who is coming to the emergency department due to having a syncopal episode after having nausea, vomiting and diarrhea for the past 3 days.  Per patient, she felt nauseous after she ate a sandwich with noodles on Sunday evening.  On Monday morning, after she woke up she had increased nausea and had about 4-5 episodes of emesis associated with 8-9 episodes of diarrhea.  She had a similar number of vomiting instances yesterday and about 4-5 today.  Her bowel movements have continued to be loose, but decreased to only 4 5 yesterday and 3-4 today.  She has mild epigastric tenderness and upper abdomen muscle discomfort due to retching, but denies melena or hematochezia.  No dysuria, frequency or hematuria.  She started feeling lightheadedness yesterday, but was worse today.  She passed out while she was trying to get through her trash can when she was about to vomit around noontime today injuring her left foot and ankle area.  She has been unable to bear weight without significant discomfort since.  She denies fever, chills, but feels fatigue and malaise.  Her appetite is decreased.  No sore throat, rhinorrhea, dyspnea or wheezing.  Denies chest pain, palpitations, diaphoresis, PND, orthopnea, but states she gets occasional lower extremity edema.  ED Course: Initial vital signs temperature 98.1 F, pulse 78, respirations 16, blood  pressure 150/73 mmHg and O2 sat 97% on nasal cannula oxygen.  The patient received a 1000 mL NS bolus, Zofran 4 mg IVP and KCl 10 mEq IVPB x3.  Urinalysis is hazy, with ketonuria 20 and proteinuria 30 mg/dL.  Microscopic only showing rare bacteria.  CBC shows a white count of 6.7, hemoglobin 11.7 g/dL and platelets 292.  CMP shows a potassium of 2.6 mmol/L, glucose of 103 and creatinine of 1.06 mg/dL.  All other CMP values are within normal limits.  Lipase was 16 units/L.  Magnesium 1.7 mg/dL.Chest radiograph, left knee, left foot and ankle x-rays did not show any acute abnormality.  Please see images and full radiology report for further detail  Review of Systems: As per HPI otherwise 10 point review of systems negative.   Past Medical History:  Diagnosis Date  . Anemia    taking iron now. pt states having no current issues 09/02/2015  . Anginal pain ([place])    pt states experiences chest wall pain pt states related to her asthma   . Anxiety    with MRI's  . Arthritis    Everywhere  . Asthma   . Dizziness   . GERD (gastroesophageal reflux disease)   . Headache(784.0)    HX OF MIGRAINES  . History of bronchitis   . Hypertension   . Hypothyroidism    takes levothyroxen  . OSA on CPAP    wears cpap  . Shortness of breath    with exertion  . Wears glasses     Past Surgical History:  Procedure Laterality Date  . CESAREAN SECTION     times 2  .  CHOLECYSTECTOMY    . ENDOMETRIAL ABLATION    . INCISION AND DRAINAGE HIP Left 11/10/2015   Procedure: IRRIGATION AND DEBRIDEMENT LEFT HIP INCISION;  Surgeon: Marilyn Rossetti, MD;  Location: [facility];  Service: Orthopedics;  Laterality: Left;  . JOINT REPLACEMENT  2011   total left knee  . KNEE ARTHROPLASTY  04/23/2012   right   . KNEE ARTHROSCOPY    . LUMBAR WOUND DEBRIDEMENT N/A 06/11/2018   Procedure: LUMBAR WOUND DEBRIDEMENT;  Surgeon: Marilyn Oto, MD;  Location: [facility];  Service: Orthopedics;  Laterality: N/A;  . RADIOLOGY WITH  ANESTHESIA N/A 09/09/2018   Procedure: LUMBER SPINE WITHOUT CONTRAST;  Surgeon: Radiologist, Medication, MD;  Location: [facility];  Service: Radiology;  Laterality: N/A;  . ROTATOR CUFF REPAIR     left   . SHOULDER SURGERY     right to repair ligament tear  . TOTAL HIP ARTHROPLASTY Left 09/09/2015   Procedure: LEFT TOTAL HIP ARTHROPLASTY ANTERIOR APPROACH;  Surgeon: Marilyn Rossetti, MD;  Location: [facility];  Service: Orthopedics;  Laterality: Left;  . TOTAL KNEE ARTHROPLASTY  04/23/2012   Procedure: TOTAL KNEE ARTHROPLASTY;  Surgeon: Newt Minion, MD;  Location: [facility];  Service: Orthopedics;  Laterality: Right;  Right Total Knee Arthroplasty  . TUBAL LIGATION  1996     reports that she quit smoking about 27 years ago. She has a 2.00 pack-year smoking history. She has never used smokeless tobacco. She reports that she does not drink alcohol or use drugs.  Allergies  Allergen Reactions  . Lisinopril Anaphylaxis  . Bee Venom Swelling and Other (See Comments)    On site swelling  . Propoxyphene Hives  . Codeine Nausea Only  . Latex Rash  . Meloxicam Other (See Comments)    Insomnia, constipation  . Morphine And Related Rash  . Tomato Rash    Family History  Problem Relation Age of Onset  . Cancer Mother        colon  . Epilepsy Mother   . Cancer Father        prostate  . Diabetes Father   . Hypertension Father   . Hypertension Maternal Aunt   . Diabetes Maternal Aunt   . Hypertension Paternal Aunt    Prior to Admission medications   Medication Sig Start Date End Date Taking? Authorizing Provider  albuterol (PROVENTIL HFA;VENTOLIN HFA) 108 (90 BASE) MCG/ACT inhaler Inhale 2 puffs into the lungs every 6 (six) hours as needed for wheezing or shortness of breath.    [provider]  albuterol (PROVENTIL) (2.5 MG/3ML) 0.083% nebulizer solution Take 3 mLs (2.5 mg total) by nebulization 3 (three) times daily. 07/19/16   Marilyn Lose, MD  allopurinol (ZYLOPRIM) 100 MG  tablet Take 1 tablet (100 mg total) by mouth 2 (two) times daily. 02/23/19   Marilyn Oto, MD  ALPRAZolam Duanne Moron) 1 MG tablet take 1 tablet 1 hour prior to MRI repeat 30  Ins prior if needed 08/06/18   Marilyn Oto, MD  amoxicillin-clavulanate (AUGMENTIN) 875-125 MG tablet Take 1 tablet po  2 hours prior to procedure and 1 tablet po 8 hours after procedure 10/08/18   Marilyn Oto, MD  cetirizine (ZYRTEC) 10 MG tablet Take 10 mg by mouth daily.     [provider]  diclofenac sodium (VOLTAREN) 1 % GEL Apply 4 g topically 4 (four) times daily. Patient taking differently: Apply 4 g topically 4 (four) times daily as needed (pain).  06/19/18  Marilyn Oto, MD  diphenhydrAMINE (BENADRYL) 25 MG tablet Take 1 tablet (25 mg total) by mouth every 6 (six) hours. Patient taking differently: Take 25 mg by mouth every 6 (six) hours as needed for itching or allergies.  09/29/15   Jola Schmidt, MD  diphenhydrAMINE HCl, Sleep, (SOBA NIGHTTIME SLEEP AID) 50 MG CAPS Take 50 mg by mouth at bedtime.    [provider]  docusate sodium (COLACE) 100 MG capsule Take 1 capsule (100 mg total) by mouth 2 (two) times daily. 05/30/18   Marilyn Oto, MD  ergocalciferol (VITAMIN D2) 50000 units capsule Take 50,000 Units by mouth every Monday.     [provider]  ferrous gluconate (FERGON) 324 MG tablet Take 1 tablet (324 mg total) by mouth 2 (two) times daily with a meal. 05/30/18   Marilyn Oto, MD  furosemide (LASIX) 20 MG tablet Take 20 mg by mouth daily as needed for fluid.  11/01/17   [provider]  gabapentin (NEURONTIN) 300 MG capsule Take two capsules at night qhs and one in the am and one with noon meal 02/06/19   Marilyn Oto, MD  hydrochlorothiazide 25 MG tablet Take 25 mg by mouth daily.     [provider]  levothyroxine (SYNTHROID, LEVOTHROID) 137 MCG tablet Take 137 mcg by mouth daily before breakfast.  05/31/17   [provider]  NARCAN 4 MG/0.1ML LIQD  nasal spray kit Place 0.4 mg into the nose once.  06/28/17   [provider]  oxybutynin (DITROPAN-XL) 10 MG 24 hr tablet Take 10 mg by mouth daily.  05/31/17   [provider]  pantoprazole (PROTONIX) 20 MG tablet Take 1 tablet (20 mg total) by mouth 2 (two) times daily before a meal. Patient taking differently: Take 20 mg by mouth 2 (two) times daily before a meal. prn 03/13/18   Virgel Manifold, MD  potassium chloride (K-DUR,KLOR-CON) 10 MEQ tablet Take 10 mEq by mouth 2 (two) times daily.     [provider]  tiZANidine (ZANAFLEX) 4 MG tablet Take 1 tablet (4 mg total) by mouth every 6 (six) hours as needed for muscle spasms. 04/22/19   Marilyn Oto, MD  vitamin C (ASCORBIC ACID) 500 MG tablet Take 500 mg by mouth daily.    [provider]    Physical Exam: Vitals:   05/20/19 1830 05/20/19 1900 05/20/19 2000 05/20/19 2030  BP: (!) 148/90 (!) 177/86 110/84 101/84  Pulse: 64 66 72 66  Resp: 18 (!) 21 (!) 24 16  Temp:      TempSrc:      SpO2: 100% 100% 100% 97%  Weight:      Height:        Constitutional: NAD, calm, comfortable Eyes: PERRL, lids and conjunctivae normal ENMT: Mucous membranes are mildly dry.  Posterior pharynx clear of any exudate or lesions. Neck: normal, supple, no masses, no thyromegaly Respiratory: clear to auscultation bilaterally, no wheezing, no crackles. Normal respiratory effort. No accessory muscle use.  Cardiovascular: Regular rate and rhythm, no murmurs / rubs / gallops. No extremity edema. 2+ pedal pulses. No carotid bruits.  Abdomen: Obese, nondistended. Bowel sounds positive.  Soft, mild epigastric tenderness, no masses palpated. No hepatosplenomegaly.  Musculoskeletal: no clubbing / cyanosis. Good ROM, no contractures. Normal muscle tone.  Skin: no rashes, lesions, ulcers. No induration on limited dermatological examination. Neurologic: CN 2-12 grossly intact. Sensation intact, DTR normal. Strength 5/5 in all 4.   Psychiatric: Normal judgment  and insight. Alert and oriented x 3. Normal mood.   Labs on Admission: I have personally reviewed following labs and imaging studies  CBC: Recent Labs  Lab 05/20/19 1858  WBC 6.7  NEUTROABS 4.2  HGB 11.7*  HCT 38.5  MCV 92.5  PLT 979   Basic Metabolic Panel: Recent Labs  Lab 05/20/19 1858  NA 139  K 2.6*  CL 102  CO2 25  GLUCOSE 103*  BUN 13  CREATININE 1.06*  CALCIUM 9.4  MG 1.7  PHOS 1.5*   GFR: Estimated Creatinine Clearance: 95 mL/min (A) (by C-G formula based on SCr of 1.06 mg/dL (H)). Liver Function Tests: Recent Labs  Lab 05/20/19 1858  AST 37  ALT 36  ALKPHOS 77  BILITOT 0.9  PROT 8.0  ALBUMIN 4.1   Recent Labs  Lab 05/20/19 1858  LIPASE 16   No results for input(s): AMMONIA in the last 168 hours. Coagulation Profile: No results for input(s): INR, PROTIME in the last 168 hours. Cardiac Enzymes: No results for input(s): CKTOTAL, CKMB, CKMBINDEX, TROPONINI in the last 168 hours. BNP (last 3 results) No results for input(s): PROBNP in the last 8760 hours. HbA1C: No results for input(s): HGBA1C in the last 72 hours. CBG: No results for input(s): GLUCAP in the last 168 hours. Lipid Profile: No results for input(s): CHOL, HDL, LDLCALC, TRIG, CHOLHDL, LDLDIRECT in the last 72 hours. Thyroid Function Tests: No results for input(s): TSH, T4TOTAL, FREET4, T3FREE, THYROIDAB in the last 72 hours. Anemia Panel: No results for input(s): VITAMINB12, FOLATE, FERRITIN, TIBC, IRON, RETICCTPCT in the last 72 hours. Urine analysis:    Component Value Date/Time   COLORURINE AMBER (A) 05/20/2019 2059   APPEARANCEUR HAZY (A) 05/20/2019 2059   LABSPEC 1.023 05/20/2019 2059   PHURINE 5.0 05/20/2019 2059   GLUCOSEU NEGATIVE 05/20/2019 2059   HGBUR NEGATIVE 05/20/2019 2059   BILIRUBINUR NEGATIVE 05/20/2019 2059   KETONESUR 20 (A) 05/20/2019 2059   PROTEINUR 30 (A) 05/20/2019 2059   UROBILINOGEN 0.2 07/18/2012 1156   NITRITE  NEGATIVE 05/20/2019 2059   LEUKOCYTESUR NEGATIVE 05/20/2019 2059    Radiological Exams on Admission: Dg Ankle Complete Left  Result Date: 05/20/2019 CLINICAL DATA:  Left ankle injury today due to a fall from a chair. Initial encounter. EXAM: LEFT ANKLE COMPLETE - 3+ VIEW COMPARISON:  None. FINDINGS: There is no evidence of fracture, dislocation, or joint effusion. Mild midfoot osteoarthritis noted. Small plantar calcaneal spur is seen. Soft tissues are unremarkable. IMPRESSION: No acute abnormality. Electronically Signed   By: Inge Rise M.D.   On: 05/20/2019 20:00   Portable Chest 1 View  Result Date: 05/20/2019 CLINICAL DATA:  Syncope. Nausea. EXAM: PORTABLE CHEST 1 VIEW COMPARISON:  07/10/2018 FINDINGS: The cardiomediastinal contours are normal for AP projection. The lungs are clear. Pulmonary vasculature is normal. No consolidation, pleural effusion, or pneumothorax. No acute osseous abnormalities are seen. IMPRESSION: No acute chest finding. Electronically Signed   By: Keith Rake M.D.   On: 05/20/2019 22:22   Dg Knee Complete 4 Views Left  Result Date: 05/20/2019 CLINICAL DATA:  Left knee injury due to a fall from a chair today. Initial encounter. EXAM: LEFT KNEE - COMPLETE 4+ VIEW COMPARISON:  None. FINDINGS: No evidence of fracture, dislocation, or joint effusion. No evidence of arthropathy or other focal bone abnormality. Left knee arthroplasty is in place. Soft tissues are unremarkable. IMPRESSION: No acute finding. Electronically Signed   By: Inge Rise M.D.   On: 05/20/2019 20:01  Dg Foot Complete Left  Result Date: 05/20/2019 CLINICAL DATA:  Left foot injury due to a fall from a chair. Initial encounter. EXAM: LEFT FOOT - COMPLETE 3+ VIEW COMPARISON:  None. FINDINGS: There is no evidence of fracture or dislocation. Moderate first MTP osteoarthritis is seen. Mild midfoot osteoarthritis and small plantar calcaneal spur noted. Soft tissues are unremarkable. IMPRESSION: No  acute finding. Electronically Signed   By: Inge Rise M.D.   On: 05/20/2019 20:01    EKG: Independently reviewed. Vent. rate 68 BPM PR interval * ms QRS duration 99 ms QT/QTc 463/493 ms P-R-T axes 18 45 57 Sinus rhythm Low voltage, precordial leads Borderline prolonged QT interval  Assessment/Plan Principal Problem:   Syncope Likely vasovagal in the setting of nausea. The patient was also volume depleted. Observation/telemetry. Antiemetics as needed. Check echocardiogram. Carotid Doppler show antegrade flow 6 months ago.  Active Problems:   Acute gastroenteritis Continue gentle IV hydration. Antiemetics as needed. Clear liquids trial.    Hypokalemia Replacing. Magnesium was supplemented. Follow-up potassium level.    Hypertension Hold diuretics for now. Monitor BP, renal function electrolytes.    Depression with anxiety Continue Cymbalta 60 mg p.o. daily.    Hypothyroidism Continue levothyroxine 125 mcg p.o. daily.    Morbid (severe) obesity due to excess calories ([place]) Will need significant lifestyle modifications.    OSA on CPAP Continue CPAP at bedtime.      DVT prophylaxis: Lovenox SQ Code Status: Full code Family Communication: Delete Disposition Plan: Observation for IV hydration and syncope work-up. Consults called: Admission status: Observation/telemetry.   Reubin Milan MD [facility]  If 7PM-7AM, please contact night-coverage [email or web address]  05/20/2019, 10:25 PM   This document was prepared using Dragon voice recognition software and may contain some unintended transcription errors.

## 2019-05-20 NOTE — ED Notes (Signed)
CRITICAL VALUE ALERT  Critical Value:  K+ 2.6  Date & Time Notied:  05-20-19 2024  Provider Notified: Melina Copa  Orders Received/Actions taken: monitor

## 2019-05-21 ENCOUNTER — Observation Stay (HOSPITAL_BASED_OUTPATIENT_CLINIC_OR_DEPARTMENT_OTHER): Payer: Medicare Other

## 2019-05-21 ENCOUNTER — Ambulatory Visit (INDEPENDENT_AMBULATORY_CARE_PROVIDER_SITE_OTHER): Payer: Medicare Other | Admitting: Specialist

## 2019-05-21 ENCOUNTER — Other Ambulatory Visit: Payer: Self-pay

## 2019-05-21 ENCOUNTER — Encounter (HOSPITAL_COMMUNITY): Payer: Self-pay | Admitting: Internal Medicine

## 2019-05-21 DIAGNOSIS — I10 Essential (primary) hypertension: Secondary | ICD-10-CM

## 2019-05-21 DIAGNOSIS — R55 Syncope and collapse: Secondary | ICD-10-CM | POA: Diagnosis not present

## 2019-05-21 DIAGNOSIS — E876 Hypokalemia: Secondary | ICD-10-CM | POA: Diagnosis not present

## 2019-05-21 LAB — ECHOCARDIOGRAM COMPLETE
Height: 63 in
Weight: 5622.61 oz

## 2019-05-21 LAB — BASIC METABOLIC PANEL
Anion gap: 11 (ref 5–15)
BUN: 13 mg/dL (ref 6–20)
CO2: 24 mmol/L (ref 22–32)
Calcium: 8.7 mg/dL — ABNORMAL LOW (ref 8.9–10.3)
Chloride: 106 mmol/L (ref 98–111)
Creatinine, Ser: 0.95 mg/dL (ref 0.44–1.00)
GFR calc Af Amer: >60 mL/min (ref >60)
GFR calc non Af Amer: >60 mL/min (ref >60)
Glucose, Bld: 98 mg/dL (ref 70–99)
Potassium: 3.2 mmol/L — ABNORMAL LOW (ref 3.5–5.1)
Sodium: 141 mmol/L (ref 135–145)

## 2019-05-21 LAB — GLUCOSE, CAPILLARY: Glucose-Capillary: 92 mg/dL (ref 70–99)

## 2019-05-21 MED ORDER — GABAPENTIN 300 MG PO CAPS
300.0000 mg | ORAL_CAPSULE | Freq: Every day | ORAL | Status: DC
  Administered 2019-05-21 – 2019-05-22 (×2): 300 mg via ORAL
  Filled 2019-05-21 (×2): qty 1

## 2019-05-21 MED ORDER — CYCLOBENZAPRINE HCL 10 MG PO TABS
10.0000 mg | ORAL_TABLET | Freq: Three times a day (TID) | ORAL | Status: DC | PRN
  Administered 2019-05-21 (×2): 10 mg via ORAL
  Filled 2019-05-21 (×2): qty 1

## 2019-05-21 MED ORDER — LORATADINE 10 MG PO TABS
10.0000 mg | ORAL_TABLET | Freq: Every day | ORAL | Status: DC
  Administered 2019-05-21 – 2019-05-22 (×2): 10 mg via ORAL
  Filled 2019-05-21 (×2): qty 1

## 2019-05-21 MED ORDER — LORAZEPAM 1 MG PO TABS: 1.0000 mg | ORAL_TABLET | Freq: Every evening | ORAL | Status: DC | PRN

## 2019-05-21 MED ORDER — K PHOS MONO-SOD PHOS DI & MONO 155-852-130 MG PO TABS
500.0000 mg | ORAL_TABLET | Freq: Four times a day (QID) | ORAL | Status: AC
  Administered 2019-05-21 – 2019-05-22 (×4): 500 mg via ORAL
  Filled 2019-05-21 (×4): qty 2

## 2019-05-21 MED ORDER — OXYBUTYNIN CHLORIDE ER 5 MG PO TB24
15.0000 mg | ORAL_TABLET | Freq: Every day | ORAL | Status: DC
  Administered 2019-05-21 – 2019-05-22 (×2): 15 mg via ORAL
  Filled 2019-05-21 (×2): qty 3

## 2019-05-21 MED ORDER — FERROUS GLUCONATE 324 (38 FE) MG PO TABS
324.0000 mg | ORAL_TABLET | Freq: Every day | ORAL | Status: DC
  Administered 2019-05-21 – 2019-05-22 (×2): 324 mg via ORAL
  Filled 2019-05-21 (×7): qty 1

## 2019-05-21 MED ORDER — ALLOPURINOL 100 MG PO TABS
100.0000 mg | ORAL_TABLET | Freq: Two times a day (BID) | ORAL | Status: DC
  Administered 2019-05-21 – 2019-05-22 (×4): 100 mg via ORAL
  Filled 2019-05-21 (×4): qty 1

## 2019-05-21 MED ORDER — DULOXETINE HCL 60 MG PO CPEP
60.0000 mg | ORAL_CAPSULE | Freq: Every day | ORAL | Status: DC
  Administered 2019-05-21 – 2019-05-22 (×2): 60 mg via ORAL
  Filled 2019-05-21 (×2): qty 1

## 2019-05-21 MED ORDER — VITAMIN D (ERGOCALCIFEROL) 1.25 MG (50000 UNIT) PO CAPS: 50000.0000 [IU] | ORAL_CAPSULE | ORAL | Status: DC

## 2019-05-21 MED ORDER — HYDROCODONE-ACETAMINOPHEN 10-325 MG PO TABS
1.0000 | ORAL_TABLET | Freq: Three times a day (TID) | ORAL | Status: DC | PRN
  Administered 2019-05-21 (×3): 1 via ORAL
  Filled 2019-05-21 (×3): qty 1

## 2019-05-21 MED ORDER — ALBUTEROL SULFATE (2.5 MG/3ML) 0.083% IN NEBU: 2.5000 mg | INHALATION_SOLUTION | Freq: Four times a day (QID) | RESPIRATORY_TRACT | Status: DC | PRN

## 2019-05-21 MED ORDER — PROCHLORPERAZINE EDISYLATE 10 MG/2ML IJ SOLN: 10.0000 mg | INTRAMUSCULAR | Status: DC | PRN

## 2019-05-21 MED ORDER — LEVOTHYROXINE SODIUM 25 MCG PO TABS
125.0000 ug | ORAL_TABLET | Freq: Every day | ORAL | Status: DC
  Administered 2019-05-21 – 2019-05-22 (×2): 125 ug via ORAL
  Filled 2019-05-21 (×2): qty 1

## 2019-05-21 MED ORDER — GABAPENTIN 300 MG PO CAPS
600.0000 mg | ORAL_CAPSULE | Freq: Every day | ORAL | Status: DC
  Administered 2019-05-21 (×2): 600 mg via ORAL
  Filled 2019-05-21 (×2): qty 2

## 2019-05-21 MED ORDER — SODIUM CHLORIDE 0.9 % IV SOLN
INTRAVENOUS | Status: DC
  Administered 2019-05-21: 20:00:00 via INTRAVENOUS

## 2019-05-21 NOTE — Care Management Obs Status (Signed)
[place] [place] NOTIFICATION   Patient Details  Name: BECKEY POLKOWSKI MRN: [id number] Date of Birth: 10/24/1970   Medicare Observation Status Notification Given:       Boneta Lucks, RN 05/21/2019, 5:02 PM

## 2019-05-21 NOTE — Progress Notes (Signed)
Echocardiogram 2D Echocardiogram has been performed.  Matilde Bash 05/21/2019, 12:10 PM

## 2019-05-21 NOTE — Progress Notes (Signed)
Patient Demographics:    Marilyn [name], is a 48 y.o. female, DOB - [birth year], ZOX:[id number]  Admit date - 05/20/2019   Admitting Physician Bobette Moavid Manuel Ortiz, MD  Outpatient Primary MD for the patient is Hemberg, Ruby ColaKatherine V, NP  LOS - 0   Chief Complaint  Patient presents with  . Loss of Consciousness        Subjective:    Marilyn [name] today has no fevers, no emesis,  No chest pain, complains of dizziness, orthostatic symptoms persist, nausea persist, diarrhea with more than 5 BMs daily, -  Assessment  & Plan :    Principal Problem:   Syncope Active Problems:   Hypokalemia   Hypertension   Depression with anxiety   Hypothyroidism   Morbid (severe) obesity due to excess calories (HCC)   OSA on CPAP   Acute gastroenteritis   Hypophosphatemia  Brief summary 48 y.o. female with medical history significant of iron deficiency anemia on ferrous sulfate maintenance, history of anginal pain, anxiety, osteoarthritis, asthma, dizziness, GERD, history of migraine headaches, hypertension, hypothyroidism, morbid obesity, OSA on CPAP admitted on 05/20/2019 with syncopal episode after recurrent vomiting and diarrhea  A/p 1)Syncope-  On telemetry monitored unit no significant arrhythmias,  EKG without ACS pattern ,   echocardiogram with EF over 60%, and grade 1 diastolic dysfunction, no significant aortic stenosis or other outflow obstruction -Recent carotid artery Dopplers without hemodynamically significant stenosis -Orthostatic symptoms persist especially dizziness with attempt to ambulate--suspect this is due to dehydration --Continue IV fluids  2) persistent diarrhea--check stool for C. difficile and GI pathogen, patient with more than 5 watery stools per day -  3)FEN--continue IV fluids, replace potassium, Unable to advance diet beyond liquids due to persistent nausea  4) morbid obesity--- BMI  over 62, this complicates overall care  5) status post fall with left knee and left ankle sprain--- continue left ankle brace  Disposition/Need for in-Hospital Stay- patient unable to be discharged at this time due to Unable to advance diet beyond liquids due to persistent nausea--continue IV fluids --Orthostatic dizziness persist -Code Status : full  Family Communication:   NA (patient is alert, awake and coherent)   Disposition Plan  : Home in am if tolerating oral intake  Consults  :  na  DVT Prophylaxis  :  Lovenox - - SCDs   Lab Results  Component Value Date   PLT 292 05/20/2019    Inpatient Medications  Scheduled Meds: . allopurinol  100 mg Oral BID  . DULoxetine  60 mg Oral Daily  . enoxaparin (LOVENOX) injection  80 mg Subcutaneous Q24H  . ferrous gluconate  324 mg Oral Q breakfast  . gabapentin  300 mg Oral Daily  . gabapentin  600 mg Oral QHS  . levothyroxine  125 mcg Oral Daily  . loratadine  10 mg Oral Daily  . oxybutynin  15 mg Oral Daily  . phosphorus  500 mg Oral QID  . sodium chloride flush  3 mL Intravenous Q12H  . [START ON 05/25/2019] Vitamin D (Ergocalciferol)  50,000 Units Oral Q Mon   Continuous Infusions: . sodium chloride     PRN Meds:.acetaminophen **OR** acetaminophen, albuterol, cyclobenzaprine, HYDROcodone-acetaminophen, LORazepam, prochlorperazine   Anti-infectives (From admission, onward)   None  Objective:   Vitals:   05/21/19 0403 05/21/19 0500 05/21/19 0800 05/21/19 1200  BP: (!) 150/74  (!) 144/85 (!) 141/86  Pulse: 71  77 70  Resp: 20  18 19   Temp: 98.6 F (37 C)  98.5 F (36.9 C) 98.4 F (36.9 C)  TempSrc: Oral  Oral Oral  SpO2: 97%  97%   Weight:  (!) 159.4 kg    Height:        Wt Readings from Last 3 Encounters:  05/21/19 (!) 159.4 kg  04/01/19 (!) 159.2 kg  02/06/19 (!) 152 kg    Intake/Output Summary (Last 24 hours) at 05/21/2019 1957 Last data filed at 05/21/2019 1700 Gross per 24 hour  Intake 1901.97  ml  Output -  Net 1901.97 ml     Physical Exam  Gen:- Awake Alert, morbidly obese HEENT:- Felt.AT, No sclera icterus Neck-Supple Neck,No JVD,.  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 The SARS-CoV-2 RNA is generally detectable in upper and lower  respiratory specimens during the acute phase of infection. The lowest  concentration of SARS-CoV-2 viral copies this assay can detect is 250  copies / mL. A negative result does not preclude SARS-CoV-2 infection  and should not be used as the sole basis for treatment or other  patient management decisions.  A negative result may occur with  improper specimen collection / handling, submission of specimen other  than nasopharyngeal swab, presence of viral mutation(s) within the  areas targeted by this assay, and inadequate number of viral copies  (<250 copies / mL). A negative result must be combined with clinical  observations, patient history, and epidemiological information. If result is POSITIVE SARS-CoV-2 target nucleic acids are DETECTED. The SARS-CoV-2 RNA is generally detectable in upper and lower  respiratory specimens dur ing the acute phase of  infection.  Positive  results are indicative of active infection with SARS-CoV-2.  Clinical  correlation with patient history and other diagnostic information is  necessary to determine patient infection status.  Positive results do  not rule out bacterial infection or co-infection with other viruses. If result is PRESUMPTIVE POSTIVE SARS-CoV-2 nucleic acids MAY BE PRESENT.   A presumptive positive result was obtained on the submitted specimen  and confirmed on repeat testing.  While 2019 novel coronavirus  (SARS-CoV-2) nucleic acids may be present in the submitted sample  additional confirmatory testing may be necessary for epidemiological  and / or clinical management purposes  to differentiate between  SARS-CoV-2 and other Sarbecovirus currently known to infect humans.  If clinically indicated additional testing with an alternate test  methodology [id number]) is advised. The SARS-CoV-2 RNA is generally  detectable in upper and lower respiratory sp ecimens during the acute  phase  of infection. The expected result is Negative. Fact Sheet for Patients:  StrictlyIdeas.no Fact Sheet for Healthcare Providers: BankingDealers.co.za This test is not yet approved or cleared by the [place] FDA and has been authorized for detection and/or diagnosis of SARS-CoV-2 by FDA under an Emergency Use Authorization (EUA).  This EUA will remain in effect (meaning this test can be used) for the duration of the COVID-19 declaration under Section 564(b)(1) of the Act, 21 U.S.C. section 360bbb-3(b)(1), unless the authorization is terminated or revoked sooner. Performed at [facility], [address], [place], [place] [address]     Radiology Reports Dg Ankle Complete Left  Result Date: 05/20/2019 CLINICAL DATA:  Left ankle injury today due to a fall from a chair. Initial encounter. EXAM: LEFT ANKLE COMPLETE - 3+ VIEW COMPARISON:  None. FINDINGS: There is no  evidence of fracture, dislocation, or joint effusion. Mild midfoot osteoarthritis noted. Small plantar calcaneal spur is seen. Soft tissues are unremarkable. IMPRESSION: No acute abnormality. Electronically Signed   By: Inge Rise M.D.   On: 05/20/2019 20:00   Portable Chest 1 View  Result Date: 05/20/2019 CLINICAL DATA:  Syncope. Nausea. EXAM: PORTABLE CHEST 1 VIEW COMPARISON:  07/10/2018 FINDINGS: The cardiomediastinal contours are normal for AP projection. The lungs are clear. Pulmonary vasculature is normal. No consolidation, pleural effusion, or pneumothorax. No acute osseous abnormalities are seen. IMPRESSION: No acute chest finding. Electronically Signed   By: Keith Rake M.D.   On: 05/20/2019 22:22   Dg Knee Complete 4 Views Left  Result Date: 05/20/2019 CLINICAL DATA:  Left knee injury due to a fall from a chair today. Initial encounter. EXAM: LEFT KNEE - COMPLETE 4+ VIEW COMPARISON:  None. FINDINGS: No evidence of fracture, dislocation, or joint effusion. No evidence of arthropathy or other focal bone abnormality. Left knee arthroplasty is in place. Soft tissues are unremarkable. IMPRESSION: No acute finding. Electronically Signed   By: Inge Rise M.D.   On: 05/20/2019 20:01   Dg Foot Complete Left  Result Date: 05/20/2019 CLINICAL DATA:  Left foot injury due to a fall from a chair. Initial encounter. EXAM: LEFT FOOT - COMPLETE 3+ VIEW COMPARISON:  None. FINDINGS: There is no evidence of fracture or dislocation. Moderate first MTP osteoarthritis is seen. Mild midfoot osteoarthritis and small plantar calcaneal spur noted. Soft tissues are unremarkable. IMPRESSION: No acute finding. Electronically Signed   By: Inge Rise M.D.   On: 05/20/2019 20:01   Mm 3d Screen Breast Bilateral  Result Date: 05/12/2019 CLINICAL DATA:  Screening. EXAM: DIGITAL SCREENING BILATERAL MAMMOGRAM WITH TOMO AND CAD COMPARISON:  Previous exam(s). ACR Breast Density Category a: The breast tissue is  almost entirely fatty. FINDINGS: There are no findings suspicious for malignancy. Images were processed with CAD. IMPRESSION: No mammographic evidence of malignancy. A result letter of this screening mammogram will be mailed directly to the patient. RECOMMENDATION: Screening mammogram in one year. (Code:SM-B-01Y) BI-RADS CATEGORY  1: Negative. Electronically Signed   By: Margarette Canada M.D.   On: 05/12/2019 14:25     CBC Recent Labs  Lab 05/20/19 1858  WBC 6.7  HGB 11.7*  HCT 38.5  PLT 292  MCV 92.5  MCH 28.1  MCHC 30.4  RDW 13.4  LYMPHSABS 1.9  MONOABS 0.3  EOSABS 0.2  BASOSABS 0.0    Chemistries  Recent Labs  Lab 05/20/19 1858 05/21/19 0448  NA 139 141  K 2.6* 3.2*  CL 102 106  CO2 25 24  GLUCOSE 103* 98  BUN 13 13  CREATININE 1.06* 0.95  CALCIUM 9.4 8.7*  MG 1.7  --   AST 37  --   ALT 36  --   ALKPHOS 77  --   BILITOT 0.9  --    ------------------------------------------------------------------------------------------------------------------ No results for input(s): CHOL, HDL, LDLCALC, TRIG, CHOLHDL, LDLDIRECT in the last 72 hours.  Lab Results  Component Value Date   HGBA1C 6.2 (H) 07/18/2016   ------------------------------------------------------------------------------------------------------------------ No results for input(s): TSH, T4TOTAL, T3FREE, THYROIDAB in the last 72 hours.  Invalid input(s): FREET3 ------------------------------------------------------------------------------------------------------------------ No results for input(s): VITAMINB12, FOLATE, FERRITIN, TIBC, IRON, RETICCTPCT in the last 72 hours.  Coagulation profile No results for input(s): INR, PROTIME in the last 168 hours.  No results for input(s): DDIMER in the last 72 hours.  Cardiac Enzymes No results for input(s): CKMB, TROPONINI, MYOGLOBIN in the last 168 hours.  Invalid input(s):  CK ------------------------------------------------------------------------------------------------------------------    Component Value Date/Time   BNP 42.0 08/27/2016 2239     Shon Haleourage Annalise Mcdiarmid M.D on 05/21/2019 at 7:57 PM  Go to [email or web address] - for contact info  [facility] - Office  [phone]

## 2019-05-21 NOTE — Progress Notes (Signed)
Patient has home CPAP unit at bedside. RT filled water chamber with sterile water and placed unit within reach of patient. Patient aware to call if she has any trouble.

## 2019-05-22 DIAGNOSIS — A0472 Enterocolitis due to Clostridium difficile, not specified as recurrent: Secondary | ICD-10-CM

## 2019-05-22 DIAGNOSIS — E876 Hypokalemia: Secondary | ICD-10-CM | POA: Diagnosis not present

## 2019-05-22 LAB — GASTROINTESTINAL PANEL BY PCR, STOOL (REPLACES STOOL CULTURE)

## 2019-05-22 LAB — URINE CULTURE

## 2019-05-22 LAB — CLOSTRIDIUM DIFFICILE BY PCR, REFLEXED: Toxigenic C. Difficile by PCR: POSITIVE — AB

## 2019-05-22 LAB — C DIFFICILE QUICK SCREEN W PCR REFLEX
C Diff antigen: POSITIVE — AB
C Diff toxin: NEGATIVE

## 2019-05-22 LAB — GLUCOSE, CAPILLARY
Glucose-Capillary: 105 mg/dL — ABNORMAL HIGH (ref 70–99)
Glucose-Capillary: 116 mg/dL — ABNORMAL HIGH (ref 70–99)

## 2019-05-22 LAB — TROPONIN I (HIGH SENSITIVITY): Troponin I (High Sensitivity): 4 ng/L (ref <18)

## 2019-05-22 LAB — HIV ANTIBODY (ROUTINE TESTING W REFLEX): HIV Screen 4th Generation wRfx: NONREACTIVE

## 2019-05-22 MED ORDER — VANCOMYCIN 50 MG/ML ORAL SOLUTION
125.0000 mg | Freq: Four times a day (QID) | ORAL | Status: DC
  Administered 2019-05-22: 17:00:00 250 mg via ORAL
  Filled 2019-05-22 (×13): qty 2.5

## 2019-05-22 MED ORDER — ONDANSETRON 4 MG PO TBDP: 4.0000 mg | ORAL_TABLET | Freq: Three times a day (TID) | ORAL | 0 refills | Status: DC | PRN

## 2019-05-22 MED ORDER — K PHOS MONO-SOD PHOS DI & MONO 155-852-130 MG PO TABS: 500.0000 mg | ORAL_TABLET | Freq: Four times a day (QID) | ORAL | 0 refills | Status: DC

## 2019-05-22 MED ORDER — ACETAMINOPHEN 325 MG PO TABS: 650.0000 mg | ORAL_TABLET | Freq: Four times a day (QID) | ORAL | 1 refills | Status: DC | PRN

## 2019-05-22 MED ORDER — VANCOMYCIN 50 MG/ML ORAL SOLUTION: 125.0000 mg | Freq: Four times a day (QID) | ORAL | 0 refills | Status: DC

## 2019-05-22 MED ORDER — VANCOMYCIN HCL 125 MG PO CAPS: 125.0000 mg | ORAL_CAPSULE | Freq: Four times a day (QID) | ORAL | 0 refills | Status: DC

## 2019-05-22 NOTE — Progress Notes (Signed)
IV removed, 2x2 gauze and paper tape applied to site, patient tolerated well. Reviewed AVS with patient who verbalized understanding.  Patient to be taken to lobby via wheelchair and transported home by her father.

## 2019-05-22 NOTE — TOC Progression Note (Signed)
Transition of Care [facility]) - Progression Note    Patient Details  Name: Marilyn [name] MRN: [id number] Date of Birth: Mar 21, 1971  Transition of [name]) CM/SW Contact  Shade Flood, LCSW Phone Number: 05/22/2019, 3:48 PM  Clinical Narrative:     PT recommending outpatient PT follow up. Spoke with pt who would like to go to the [facility] clinic in [place]. TOC team entered referral. They will call her to schedule.       Expected Discharge Plan and Services           Expected Discharge Date: 05/22/19                                     Social Determinants of Health (SDOH) Interventions    Readmission Risk Interventions Readmission Risk Prevention Plan 05/30/2018  Transportation Screening Complete  PCP or Specialist Appt within 5-7 Days Complete  Home Care Screening Complete  Medication Review (RN CM) Complete  Some recent data might be hidden

## 2019-05-22 NOTE — Discharge Summary (Signed)
Marilyn [name], is a 48 y.o. female  DOB 01-24-1971  MRN [id number].  Admission date:  05/20/2019  Admitting Physician  Reubin Milan, MD  Discharge Date:  05/22/2019   Primary MD  Bridget Hartshorn, NP  Recommendations for primary care physician for things to follow:   1)Take oral Vancomycin 125 mg 4 times a day for 10 days as prescribed for C. difficile/C. difficile bowel infection 2) outpatient physical therapy for your left [name] and left ankle sprain to be set up for you 3) avoid Imodium, Pepto-Bismol and other over-the-counter anti-diarrheal agents 4) drink plenty fluids and avoid dehydration  Admission Diagnosis  Hypokalemia [E87.6] Syncope [R55] Nausea vomiting and diarrhea [R11.2, R19.7] Syncope, unspecified syncope type [R55] Sprain of left ankle, unspecified ligament, initial encounter [S93.402A]   Discharge Diagnosis  Hypokalemia [E87.6] Syncope [R55] Nausea vomiting and diarrhea [R11.2, R19.7] Syncope, unspecified syncope type [R55] Sprain of left ankle, unspecified ligament, initial encounter [S93.402A]    Principal Problem:   C. difficile colitis Active Problems:   Hypokalemia   Hypertension   Depression with anxiety   Hypothyroidism   Morbid (severe) obesity due to excess calories ([place])   Syncope   OSA on CPAP   Acute gastroenteritis   Hypophosphatemia      Past Medical History:  Diagnosis Date  . Anemia    taking iron now. pt states having no current issues 09/02/2015  . Anginal pain ([place])    pt states experiences chest wall pain pt states related to her asthma   . Anxiety    with MRI's  . Arthritis    Everywhere  . Asthma   . Dizziness   . GERD (gastroesophageal reflux disease)   . Headache(784.0)    HX OF MIGRAINES  . History of bronchitis   . Hypertension   . Hypothyroidism    takes levothyroxen  . OSA on CPAP    wears cpap  . Shortness of  breath    with exertion  . Wears glasses     Past Surgical History:  Procedure Laterality Date  . CESAREAN SECTION     times 2  . CHOLECYSTECTOMY    . ENDOMETRIAL ABLATION    . INCISION AND DRAINAGE HIP Left 11/10/2015   Procedure: IRRIGATION AND DEBRIDEMENT LEFT HIP INCISION;  Surgeon: Mcarthur Rossetti, MD;  Location: [facility];  Service: Orthopedics;  Laterality: Left;  . JOINT REPLACEMENT  2011   total left [name]  . [name] ARTHROPLASTY  04/23/2012   right   . [name] ARTHROSCOPY    . LUMBAR WOUND DEBRIDEMENT N/A 06/11/2018   Procedure: LUMBAR WOUND DEBRIDEMENT;  Surgeon: Jessy Oto, MD;  Location: [facility];  Service: Orthopedics;  Laterality: N/A;  . RADIOLOGY WITH ANESTHESIA N/A 09/09/2018   Procedure: LUMBER SPINE WITHOUT CONTRAST;  Surgeon: Radiologist, Medication, MD;  Location: [facility];  Service: Radiology;  Laterality: N/A;  . ROTATOR CUFF REPAIR     left   . SHOULDER SURGERY     right to repair ligament  tear  . TOTAL HIP ARTHROPLASTY Left 09/09/2015   Procedure: LEFT TOTAL HIP ARTHROPLASTY ANTERIOR APPROACH;  Surgeon: Mcarthur Rossetti, MD;  Location: [facility];  Service: Orthopedics;  Laterality: Left;  . TOTAL [name] ARTHROPLASTY  04/23/2012   Procedure: TOTAL [name] ARTHROPLASTY;  Surgeon: Newt Minion, MD;  Location: [facility];  Service: Orthopedics;  Laterality: Right;  Right Total [name] Arthroplasty  . TUBAL LIGATION  1996       HPI  from the history and physical done on the day of admission:    Patient coming from: Home.  I have personally briefly reviewed patient's old medical records in [place]  Chief Complaint: Nausea, vomiting and diarrhea  HPI: Marilyn [name] is a 48 y.o. female with medical history significant of iron deficiency anemia on ferrous sulfate maintenance, history of anginal pain, anxiety, osteoarthritis, asthma, dizziness, GERD, history of migraine headaches, hypertension, hypothyroidism, morbid obesity, OSA on CPAP who is coming to the  emergency department due to having a syncopal episode after having nausea, vomiting and diarrhea for the past 3 days.  Per patient, she felt nauseous after she ate a sandwich with noodles on Sunday evening.  On Monday morning, after she woke up she had increased nausea and had about 4-5 episodes of emesis associated with 8-9 episodes of diarrhea.  She had a similar number of vomiting instances yesterday and about 4-5 today.  Her bowel movements have continued to be loose, but decreased to only 4 5 yesterday and 3-4 today.  She has mild epigastric tenderness and upper abdomen muscle discomfort due to retching, but denies melena or hematochezia.  No dysuria, frequency or hematuria.  She started feeling lightheadedness yesterday, but was worse today.  She passed out while she was trying to get through her trash can when she was about to vomit around noontime today injuring her left foot and ankle area.  She has been unable to bear weight without significant discomfort since.  She denies fever, chills, but feels fatigue and malaise.  Her appetite is decreased.  No sore throat, rhinorrhea, dyspnea or wheezing.  Denies chest pain, palpitations, diaphoresis, PND, orthopnea, but states she gets occasional lower extremity edema.  ED Course: Initial vital signs temperature 98.1 F, pulse 78, respirations 16, blood pressure 150/73 mmHg and O2 sat 97% on nasal cannula oxygen.  The patient received a 1000 mL NS bolus, Zofran 4 mg IVP and KCl 10 mEq IVPB x3.  Urinalysis is hazy, with ketonuria 20 and proteinuria 30 mg/dL.  Microscopic only showing rare bacteria.  CBC shows a white count of 6.7, hemoglobin 11.7 g/dL and platelets 292.  CMP shows a potassium of 2.6 mmol/L, glucose of 103 and creatinine of 1.06 mg/dL.  All other CMP values are within normal limits.  Lipase was 16 units/L.  Magnesium 1.7 mg/dL.Chest radiograph, left [name], left foot and ankle x-rays did not show any acute abnormality.  Please see images and  full radiology report for further detail     Hospital Course:    Brief Summary 48 y.o.femalewith medical history significant ofiron deficiency anemia on ferrous sulfate maintenance, history of anginal pain, anxiety, osteoarthritis, asthma, dizziness, GERD, history of migraine headaches, hypertension, hypothyroidism, morbid obesity, OSA on CPAP admitted on 05/20/2019 with syncopal episode after recurrent vomiting and diarrhea -stool studies suggest C. difficile colitis  A/p 1)Syncope-  On telemetry monitored unit no significant arrhythmias,  EKG without ACS pattern ,   echocardiogram with EF over 60%, and grade 1 diastolic  dysfunction, no significant aortic stenosis or other outflow obstruction -Recent carotid artery Dopplers without hemodynamically significant stenosis -Orthostatic symptoms -resolved with IV fluids  2) persistent diarrhea--patient apparently recently completed antibiotics for dental infection, stool for C. difficile is positive (antigen is positive, C. difficile toxin is negative, C. difficile by PCR positive for toxigenic C. difficile )--discussed with Dr. Carlyle Basques from infectious disease, she recommends treating with oral vancomycin for 10 days for presumed C. difficile infection --- GI pathogen test is pending at the time of discharge -Patient had 3 loose stools today -Tolerating oral intake well -  3)morbid obesity/OSA--- BMI over 62, this complicates overall care, continue CPAP nightly  5) status post fall with left [name] and left ankle sprain--- continue left ankle brace, outpatient physical therapy advised  -Code Status : full  Family Communication:   NA (patient is alert, awake and coherent)  Disposition Plan  : Home    Discharge Condition: stable  Follow UP--- PCP within a week for recheck  Consults obtained -full consult with infectious disease Dr. Carlyle Basques  Diet and Activity recommendation:  As advised  Discharge Instructions     Discharge Instructions    Ambulatory referral to Physical Therapy   Complete by: As directed    Call MD for:  difficulty breathing, headache or visual disturbances   Complete by: As directed    Call MD for:  persistant dizziness or light-headedness   Complete by: As directed    Call MD for:  persistant nausea and vomiting   Complete by: As directed    Call MD for:  severe uncontrolled pain   Complete by: As directed    Call MD for:  temperature >100.4   Complete by: As directed    Diet - low sodium heart healthy   Complete by: As directed    Discharge instructions   Complete by: As directed    1)Take oral Vancomycin 125 mg 4 times a day for 10 days as prescribed for C. difficile/C. difficile bowel infection 2) outpatient physical therapy for your left [name] and left ankle sprain to be set up for you 3) avoid Imodium, Pepto-Bismol and other over-the-counter anti-diarrheal agents 4) drink plenty fluids and avoid dehydration   Increase activity slowly   Complete by: As directed         Discharge Medications     Allergies as of 05/22/2019      Reactions   Lisinopril Anaphylaxis   Bee Venom Swelling, Other (See Comments)   On site swelling   Propoxyphene Hives   Codeine Nausea Only   Latex Rash   Meloxicam Other (See Comments)   Insomnia, constipation   Morphine And Related Rash   Tomato Rash      Medication List    STOP taking these medications   docusate sodium 100 MG capsule Commonly known as: COLACE   hydrochlorothiazide 25 MG tablet Commonly known as: HYDRODIURIL   tiZANidine 4 MG tablet Commonly known as: Zanaflex     TAKE these medications   acetaminophen 325 MG tablet Commonly known as: TYLENOL Take 2 tablets (650 mg total) by mouth every 6 (six) hours as needed for mild pain (or Fever >/= 101).   albuterol (2.5 MG/3ML) 0.083% nebulizer solution Commonly known as: PROVENTIL Take 3 mLs (2.5 mg total) by nebulization 3 (three) times daily. What  changed:   when to take this  reasons to take this   albuterol 108 (90 Base) MCG/ACT inhaler Commonly known as: VENTOLIN HFA  Inhale 2 puffs into the lungs every 6 (six) hours as needed for wheezing or shortness of breath. What changed: Another medication with the same name was changed. Make sure you understand how and when to take each.   allopurinol 100 MG tablet Commonly known as: ZYLOPRIM Take 1 tablet (100 mg total) by mouth 2 (two) times daily.   cetirizine 10 MG tablet Commonly known as: ZYRTEC Take 10 mg by mouth daily.   cyclobenzaprine 10 MG tablet Commonly known as: FLEXERIL Take 10 mg by mouth 3 (three) times daily as needed for muscle spasms.   diclofenac sodium 1 % Gel Commonly known as: VOLTAREN Apply 4 g topically 4 (four) times daily. What changed:   when to take this  reasons to take this   DULoxetine 60 MG capsule Commonly known as: CYMBALTA Take 60 mg by mouth daily.   ergocalciferol 1.25 MG (50000 UT) capsule Commonly known as: VITAMIN D2 Take 50,000 Units by mouth every Monday.   ferrous gluconate 324 MG tablet Commonly known as: FERGON Take 1 tablet (324 mg total) by mouth 2 (two) times daily with a meal. What changed: when to take this   gabapentin 300 MG capsule Commonly known as: NEURONTIN Take two capsules at night qhs and one in the am and one with noon meal What changed:   how much to take  how to take this  when to take this  additional instructions   HYDROcodone-acetaminophen 10-325 MG tablet Commonly known as: NORCO Take 1 tablet by mouth every 8 (eight) hours as needed. for pain   Lasix 20 MG tablet Generic drug: furosemide Take 20 mg by mouth daily as needed for fluid.   levothyroxine 125 MCG tablet Commonly known as: SYNTHROID Take 125 mcg by mouth daily.   Narcan 4 MG/0.1ML Liqd nasal spray kit Generic drug: naloxone Place 0.4 mg into the nose once.   ondansetron 4 MG disintegrating tablet Commonly known as:  Zofran ODT Take 1 tablet (4 mg total) by mouth every 8 (eight) hours as needed for nausea or vomiting.   oxybutynin 15 MG 24 hr tablet Commonly known as: DITROPAN XL Take 15 mg by mouth daily.   pantoprazole 20 MG tablet Commonly known as: PROTONIX Take 1 tablet (20 mg total) by mouth 2 (two) times daily before a meal. What changed:   when to take this  reasons to take this   phosphorus 155-852-130 MG tablet Commonly known as: K PHOS NEUTRAL Take 2 tablets (500 mg total) by mouth 4 (four) times daily.   potassium chloride 10 MEQ tablet Commonly known as: K-DUR Take 10 mEq by mouth 2 (two) times daily.   vancomycin 50 mg/mL  oral solution Commonly known as: VANCOCIN Take 2.5 mLs (125 mg total) by mouth 4 (four) times daily.   vitamin C 500 MG tablet Commonly known as: ASCORBIC ACID Take 500 mg by mouth daily.   zolpidem 10 MG tablet Commonly known as: AMBIEN Take 10 mg by mouth at bedtime.       Major procedures and Radiology Reports - PLEASE review detailed and final reports for all details, in brief -   Dg Ankle Complete Left  Result Date: 05/20/2019 CLINICAL DATA:  Left ankle injury today due to a fall from a chair. Initial encounter. EXAM: LEFT ANKLE COMPLETE - 3+ VIEW COMPARISON:  None. FINDINGS: There is no evidence of fracture, dislocation, or joint effusion. Mild midfoot osteoarthritis noted. Small plantar calcaneal spur is seen. Soft tissues are unremarkable.  IMPRESSION: No acute abnormality. Electronically Signed   By: Inge Rise M.D.   On: 05/20/2019 20:00   Portable Chest 1 View  Result Date: 05/20/2019 CLINICAL DATA:  Syncope. Nausea. EXAM: PORTABLE CHEST 1 VIEW COMPARISON:  07/10/2018 FINDINGS: The cardiomediastinal contours are normal for AP projection. The lungs are clear. Pulmonary vasculature is normal. No consolidation, pleural effusion, or pneumothorax. No acute osseous abnormalities are seen. IMPRESSION: No acute chest finding. Electronically  Signed   By: Keith Rake M.D.   On: 05/20/2019 22:22   Dg [name] Complete 4 Views Left  Result Date: 05/20/2019 CLINICAL DATA:  Left [name] injury due to a fall from a chair today. Initial encounter. EXAM: LEFT [name] - COMPLETE 4+ VIEW COMPARISON:  None. FINDINGS: No evidence of fracture, dislocation, or joint effusion. No evidence of arthropathy or other focal bone abnormality. Left [name] arthroplasty is in place. Soft tissues are unremarkable. IMPRESSION: No acute finding. Electronically Signed   By: Inge Rise M.D.   On: 05/20/2019 20:01   Dg Foot Complete Left  Result Date: 05/20/2019 CLINICAL DATA:  Left foot injury due to a fall from a chair. Initial encounter. EXAM: LEFT FOOT - COMPLETE 3+ VIEW COMPARISON:  None. FINDINGS: There is no evidence of fracture or dislocation. Moderate first MTP osteoarthritis is seen. Mild midfoot osteoarthritis and small plantar calcaneal spur noted. Soft tissues are unremarkable. IMPRESSION: No acute finding. Electronically Signed   By: Inge Rise M.D.   On: 05/20/2019 20:01   Mm 3d Screen Breast Bilateral  Result Date: 05/12/2019 CLINICAL DATA:  Screening. EXAM: DIGITAL SCREENING BILATERAL MAMMOGRAM WITH TOMO AND CAD COMPARISON:  Previous exam(s). ACR Breast Density Category a: The breast tissue is almost entirely fatty. FINDINGS: There are no findings suspicious for malignancy. Images were processed with CAD. IMPRESSION: No mammographic evidence of malignancy. A result letter of this screening mammogram will be mailed directly to the patient. RECOMMENDATION: Screening mammogram in one year. (Code:SM-B-01Y) BI-RADS CATEGORY  1: Negative. Electronically Signed   By: Margarette Canada M.D.   On: 05/12/2019 14:25    Micro Results   Recent Results (from the past 240 hour(s))  SARS Coronavirus 2 [facility] order, Performed in [facility] hospital lab) Nasopharyngeal Nasopharyngeal Swab     Status: None   Collection Time: 05/20/19  9:06 PM   Specimen:  Nasopharyngeal Swab  Result Value Ref Range Status   SARS Coronavirus 2 NEGATIVE NEGATIVE Final    Comment: (NOTE) If result is NEGATIVE SARS-CoV-2 target nucleic acids are NOT DETECTED. The SARS-CoV-2 RNA is generally detectable in upper and lower  respiratory specimens during the acute phase of infection. The lowest  concentration of SARS-CoV-2 viral copies this assay can detect is 250  copies / mL. A negative result does not preclude SARS-CoV-2 infection  and should not be used as the sole basis for treatment or other  patient management decisions.  A negative result may occur with  improper specimen collection / handling, submission of specimen other  than nasopharyngeal swab, presence of viral mutation(s) within the  areas targeted by this assay, and inadequate number of viral copies  (<250 copies / mL). A negative result must be combined with clinical  observations, patient history, and epidemiological information. If result is POSITIVE SARS-CoV-2 target nucleic acids are DETECTED. The SARS-CoV-2 RNA is generally detectable in upper and lower  respiratory specimens dur ing the acute phase of infection.  Positive  results are indicative of active infection with SARS-CoV-2.  Clinical  correlation  with patient history and other diagnostic information is  necessary to determine patient infection status.  Positive results do  not rule out bacterial infection or co-infection with other viruses. If result is PRESUMPTIVE POSTIVE SARS-CoV-2 nucleic acids MAY BE PRESENT.   A presumptive positive result was obtained on the submitted specimen  and confirmed on repeat testing.  While 2019 novel coronavirus  (SARS-CoV-2) nucleic acids may be present in the submitted sample  additional confirmatory testing may be necessary for epidemiological  and / or clinical management purposes  to differentiate between  SARS-CoV-2 and other Sarbecovirus currently known to infect humans.  If clinically  indicated additional testing with an alternate test  methodology [phone]) is advised. The SARS-CoV-2 RNA is generally  detectable in upper and lower respiratory sp ecimens during the acute  phase of infection. The expected result is Negative. Fact Sheet for Patients:  StrictlyIdeas.no Fact Sheet for Healthcare Providers: BankingDealers.co.za This test is not yet approved or cleared by the [place] FDA and has been authorized for detection and/or diagnosis of SARS-CoV-2 by FDA under an Emergency Use Authorization (EUA).  This EUA will remain in effect (meaning this test can be used) for the duration of the COVID-19 declaration under Section 564(b)(1) of the Act, 21 U.S.C. section 360bbb-3(b)(1), unless the authorization is terminated or revoked sooner. Performed at [facility], [address] [place], [place] [address]   Urine culture     Status: None   Collection Time: 05/20/19  9:47 PM   Specimen: Urine, Clean Catch  Result Value Ref Range Status   Specimen Description   Final    URINE, CLEAN CATCH Performed at [facility], [address], [place], [place] [address]    Special Requests   Final    NONE Performed at [facility], [address] [place], [place] [address]    Culture   Final    Multiple bacterial morphotypes present, none predominant. Suggest appropriate recollection if clinically indicated.   Report Status 05/22/2019 FINAL  Final  C difficile quick scan w PCR reflex     Status: Abnormal   Collection Time: 05/22/19 12:09 AM   Specimen: STOOL  Result Value Ref Range Status   C Diff antigen POSITIVE (A) NEGATIVE Final   C Diff toxin NEGATIVE NEGATIVE Final   C Diff interpretation Results are indeterminate. See PCR results.  Final    Comment: Performed at [facility], [address], [place], [place] [address]  C. Diff by PCR, Reflexed     Status: Abnormal   Collection Time: 05/22/19 12:09 AM  Result Value Ref  Range Status   Toxigenic C. Difficile by PCR POSITIVE (A) NEGATIVE Final    Comment: Positive for toxigenic C. difficile with little to no toxin production. Only treat if clinical presentation suggests symptomatic illness. Performed at [facility], [place] [address] [place], [place] [address]        Today   Subjective    Marilyn [name] today has no new complaints  -3 loose stools today, stools are without blood or mucus -No fevers no chills, tolerating oral intake well without emesis -          Patient has been seen and examined prior to discharge   Objective   Blood pressure 132/71, pulse 81, temperature 98 F (36.7 C), temperature source Oral, resp. rate 19, height '5\' 3"'  (1.6 m), weight (!) 163.3 kg, SpO2 97 %.   Intake/Output Summary (Last 24 hours) at 05/22/2019 1548 Last  data filed at 05/22/2019 1500 Gross per 24 hour  Intake 2189.93 ml  Output 350 ml  Net 1839.93 ml    Exam Gen:- Awake Alert, morbidly obese HEENT:- [place].AT, No sclera icterus Neck-Supple Neck,No JVD,.  Lungs-  CTAB , fair symmetrical air movement CV- S1, S2 normal, regular  Abd-  +ve B.Sounds, Abd Soft, generalized abdominal discomfort without rebound or guarding, increased truncal adiposity noted Extremity/Skin:- No  edema, pedal pulses present  Psych-affect is appropriate, oriented x3 Neuro-no new focal deficits, no tremors MSK-left ankle brace, scar from prior left total [name] replacement   Data Review   CBC w Diff:  Lab Results  Component Value Date   WBC 6.7 05/20/2019   HGB 11.7 (L) 05/20/2019   HGB 11.5 01/08/2019   HCT 38.5 05/20/2019   HCT 36.5 01/08/2019   PLT 292 05/20/2019   LYMPHOPCT 28 05/20/2019   MONOPCT 5 05/20/2019   EOSPCT 2 05/20/2019   BASOPCT 0 05/20/2019    CMP:  Lab Results  Component Value Date   NA 141 05/21/2019   K 3.2 (L) 05/21/2019   CL 106 05/21/2019   CO2 24 05/21/2019   BUN 13 05/21/2019   CREATININE 0.95 05/21/2019   CREATININE 1.26 (H)  01/08/2019   PROT 8.0 05/20/2019   ALBUMIN 4.1 05/20/2019   BILITOT 0.9 05/20/2019   ALKPHOS 77 05/20/2019   AST 37 05/20/2019   ALT 36 05/20/2019  .   Total Discharge time is about 33 minutes  Roxan Hockey M.D on 05/22/2019 at 3:48 PM  Go to [email or web address] -  for contact info  [facility] - Office  [phone]

## 2019-05-22 NOTE — Discharge Instructions (Signed)
1)Take oral Vancomycin 125 mg 4 times a day for 10 days as prescribed for C. difficile/C. difficile bowel infection 2) outpatient physical therapy for your left knee and left ankle sprain to be set up for you 3) avoid Imodium, Pepto-Bismol and other over-the-counter anti-diarrheal agents 4) drink plenty fluids and avoid dehydration

## 2019-05-22 NOTE — Evaluation (Signed)
Physical Therapy Evaluation Patient Details Name: Marilyn [name] MRN: [id number] DOB: [birth year] Today's Date: 05/22/2019   History of Present Illness  Marilyn [name] is a 48 y.o. female with medical history significant of iron deficiency anemia on ferrous sulfate maintenance, history of anginal pain, anxiety, osteoarthritis, asthma, dizziness, GERD, history of migraine headaches, hypertension, hypothyroidism, morbid obesity, OSA on CPAP who is coming to the emergency department due to having a syncopal episode after having nausea, vomiting and diarrhea for the past 3 days.    Clinical Impression  Patient presents with soft brace on left ankle, demonstrates good return for ambulation in room and hallways without loss of balance and continued sitting up in chair after therapy.  Plan:  Patient discharged from physical therapy to care of nursing for ambulation daily as tolerated for length of stay.    Follow Up Recommendations Outpatient PT    Equipment Recommendations  None recommended by PT    Recommendations for Other Services       Precautions / Restrictions Precautions Precautions: None Restrictions Weight Bearing Restrictions: No      Mobility  Bed Mobility Overal bed mobility: Modified Independent                Transfers Overall transfer level: Modified independent Equipment used: Rolling walker (2 wheeled)             General transfer comment: limited weightbearing on left ankle due to soreness  Ambulation/Gait Ambulation/Gait assistance: Modified independent (Device/Increase time) Gait Distance (Feet): 130 Feet Assistive device: Rolling walker (2 wheeled) Gait Pattern/deviations: Decreased step length - left;Decreased stance time - left;Decreased stride length Gait velocity: decreased   General Gait Details: grossly WFL except limited weightbearing on left ankle due to increased pain, no loss of balance, good return for use of RW  Stairs             Wheelchair Mobility    Modified Rankin (Stroke Patients Only)       Balance Overall balance assessment: Needs assistance Sitting-balance support: Feet supported;No upper extremity supported Sitting balance-Leahy Scale: Good     Standing balance support: Bilateral upper extremity supported;During functional activity Standing balance-Leahy Scale: Fair Standing balance comment: fair/good using RW                             Pertinent Vitals/Pain Pain Assessment: Faces Faces Pain Scale: Hurts a little bit Pain Location: left ankle when weightbearing Pain Descriptors / Indicators: Sore Pain Intervention(s): Limited activity within patient's tolerance;Monitored during session    Home Living Family/patient expects to be discharged to:: Private residence Living Arrangements: Parent Available Help at Discharge: Family;Available 24 hours/day Type of Home: House Home Access: Ramped entrance     Home Layout: One level Home Equipment: Cane - single point;Bedside commode;Shower seat;Adaptive equipment;Walker - 2 wheels;Crutches      Prior Function Level of Independence: Independent         Comments: Hydrographic surveyor, drives     Hand Dominance   Dominant Hand: Right    Extremity/Trunk Assessment   Upper Extremity Assessment Upper Extremity Assessment: Overall WFL for tasks assessed    Lower Extremity Assessment Lower Extremity Assessment: Overall WFL for tasks assessed;LLE deficits/detail LLE Deficits / Details: grossly -4/5 LLE: Unable to fully assess due to pain(due to ankle pain) LLE Sensation: WNL LLE Coordination: WNL    Cervical / Trunk Assessment Cervical / Trunk Assessment: Normal  Communication   Communication: No  difficulties  Cognition Arousal/Alertness: Awake/alert Behavior During Therapy: WFL for tasks assessed/performed Overall Cognitive Status: Within Functional Limits for tasks assessed                                         General Comments      Exercises     Assessment/Plan    PT Assessment All further PT needs can be met in the next venue of care  PT Problem List Decreased range of motion;Decreased strength;Decreased balance;Decreased mobility       PT Treatment Interventions      PT Goals (Current goals can be found in the Care Plan section)  Acute Rehab PT Goals Patient Stated Goal: return home PT Goal Formulation: With patient Time For Goal Achievement: 05/22/19 Potential to Achieve Goals: Good    Frequency     Barriers to discharge        Co-evaluation               AM-PAC PT "6 Clicks" Mobility  Outcome Measure Help needed turning from your back to your side while in a flat bed without using bedrails?: None Help needed moving from lying on your back to sitting on the side of a flat bed without using bedrails?: None Help needed moving to and from a bed to a chair (including a wheelchair)?: None Help needed standing up from a chair using your arms (e.g., wheelchair or bedside chair)?: None Help needed to walk in hospital room?: A Little Help needed climbing 3-5 steps with a railing? : A Little 6 Click Score: 22    End of Session   Activity Tolerance: Patient tolerated treatment well Patient left: in chair;with call bell/phone within reach Nurse Communication: Mobility status PT Visit Diagnosis: Unsteadiness on feet (R26.81);Other abnormalities of gait and mobility (R26.89);Muscle weakness (generalized) (M62.81)    Time: 2336-1224 PT Time Calculation (min) (ACUTE ONLY): 25 min   Charges:   PT Evaluation $PT Eval Moderate Complexity: 1 Mod PT Treatments $Therapeutic Activity: 23-37 mins        3:09 PM, 05/22/19 Lonell Grandchild, MPT Physical Therapist with [facility] [phone] office [phone] mobile phone

## 2019-05-27 ENCOUNTER — Other Ambulatory Visit: Payer: Self-pay

## 2019-05-27 ENCOUNTER — Ambulatory Visit: Payer: Medicare Other | Admitting: Physical Therapy

## 2019-06-04 ENCOUNTER — Telehealth: Payer: Self-pay | Admitting: Specialist

## 2019-06-04 NOTE — Telephone Encounter (Signed)
Patient was transferred to my voice mail and left message at 1:10pm today saying "this message is for [name]" and "I am ready to schedule my back surgery with Dr. Louanne Skye".

## 2019-06-09 NOTE — Telephone Encounter (Signed)
I called patient and scheduled surgery. 

## 2019-06-11 ENCOUNTER — Other Ambulatory Visit: Payer: Self-pay

## 2019-06-11 ENCOUNTER — Telehealth: Payer: Self-pay | Admitting: Specialist

## 2019-06-11 ENCOUNTER — Encounter: Payer: Self-pay | Admitting: Physical Therapy

## 2019-06-11 ENCOUNTER — Ambulatory Visit: Payer: Medicare Other | Attending: Adult Health Nurse Practitioner | Admitting: Physical Therapy

## 2019-06-11 DIAGNOSIS — M25572 Pain in left ankle and joints of left foot: Secondary | ICD-10-CM | POA: Diagnosis present

## 2019-06-11 NOTE — Therapy (Signed)
[facility] [place], [name], [address] Phone: [phone]   Fax:  [phone]  Physical Therapy Evaluation  Patient Details  Name: Marilyn [name] MRN: [id number] Date of Birth: [birth year] Referring Provider (PT): Lars Mage   Encounter Date: 06/11/2019  PT End of Session - 06/11/19 1449    Visit Number  1    Number of Visits  12    Date for PT Re-Evaluation  07/23/19    Authorization Type  PROGRESS NOTE AT 10TH VISIT.  KX MODIFIER AFTER 15 VISITS.    PT Start Time  0203    PT Stop Time  0241    PT Time Calculation (min)  38 min    Activity Tolerance  Patient tolerated treatment well    Behavior During Therapy  [facility] for tasks assessed/performed       Past Medical History:  Diagnosis Date  . Anemia    taking iron now. pt states having no current issues 09/02/2015  . Anginal pain ([place])    pt states experiences chest wall pain pt states related to her asthma   . Anxiety    with MRI's  . Arthritis    Everywhere  . Asthma   . Dizziness   . GERD (gastroesophageal reflux disease)   . Headache(784.0)    HX OF MIGRAINES  . History of bronchitis   . Hypertension   . Hypothyroidism    takes levothyroxen  . OSA on CPAP    wears cpap  . Shortness of breath    with exertion  . Wears glasses     Past Surgical History:  Procedure Laterality Date  . CESAREAN SECTION     times 2  . CHOLECYSTECTOMY    . ENDOMETRIAL ABLATION    . INCISION AND DRAINAGE HIP Left 11/10/2015   Procedure: IRRIGATION AND DEBRIDEMENT LEFT HIP INCISION;  Surgeon: Mcarthur Rossetti, MD;  Location: [facility];  Service: Orthopedics;  Laterality: Left;  . JOINT REPLACEMENT  2011   total left knee  . KNEE ARTHROPLASTY  04/23/2012   right   . KNEE ARTHROSCOPY    . LUMBAR WOUND DEBRIDEMENT N/A 06/11/2018   Procedure: LUMBAR WOUND DEBRIDEMENT;  Surgeon: Jessy Oto, MD;  Location: [facility];  Service: Orthopedics;  Laterality: N/A;  .  RADIOLOGY WITH ANESTHESIA N/A 09/09/2018   Procedure: LUMBER SPINE WITHOUT CONTRAST;  Surgeon: Radiologist, Medication, MD;  Location: [facility];  Service: Radiology;  Laterality: N/A;  . ROTATOR CUFF REPAIR     left   . SHOULDER SURGERY     right to repair ligament tear  . TOTAL HIP ARTHROPLASTY Left 09/09/2015   Procedure: LEFT TOTAL HIP ARTHROPLASTY ANTERIOR APPROACH;  Surgeon: Mcarthur Rossetti, MD;  Location: [facility];  Service: Orthopedics;  Laterality: Left;  . TOTAL KNEE ARTHROPLASTY  04/23/2012   Procedure: TOTAL KNEE ARTHROPLASTY;  Surgeon: Newt Minion, MD;  Location: [facility];  Service: Orthopedics;  Laterality: Right;  Right Total Knee Arthroplasty  . TUBAL LIGATION  1996    There were no vitals filed for this visit.   Subjective Assessment - 06/11/19 1421    Subjective  COVID-19 screen performed prior to patient entering clinic.  Patient passed out and fell ~3 weeks ago after being dehydrated.  Her left knee and ankle began to hurt.  Her  left knee has been getting better but her left ankle continues to hurt and therfore she wants treatment to this area.  Her pain rises  to a 5/10 when up too long.  Pain right now is about a 3/10.  Rest decreases her pain.  She feels her plantart faciitis has flared-up as well.    Pertinent History  RTC surgery, lumbar surgery, hip surgery.    How long can you walk comfortably?  A community distance with increasing pain.    Patient Stated Goals  Get out of pain.         [facility] PT Assessment - 06/11/19 0001      Assessment   Medical Diagnosis  Left ankle pain.    Referring Provider (PT)  Sharon Seller    Onset Date/Surgical Date  --   ~3 weeks.     Precautions   Precautions  None      Restrictions   Weight Bearing Restrictions  No      Balance Screen   Has the patient fallen in the past 6 months  Yes    How many times?  --   1.   Has the patient had a decrease in activity level because of a fear of falling?   No    Is the patient  reluctant to leave their home because of a fear of falling?   No      Home Public house manager residence      Prior Function   Level of Independence  Independent      ROM / Strength   AROM / PROM / Strength  AROM;Strength      AROM   Overall AROM Comments  Full active left ankle range of motion.      Strength   Overall Strength Comments  Normal left ankle strength.      Palpation   Palpation comment  Tender to palpation over anterior ankle (talus region), medial arch and heel.      Ambulation/Gait   Gait Comments  Decreased stance time over left LE.  She has a CAM boot/walker as needed.                Objective measurements completed on examination: See above findings.      [facility] Adult PT Treatment/Exercise - 06/11/19 0001      Modalities   Modalities  Vasopneumatic      Vasopneumatic   Number Minutes Vasopneumatic   15 minutes    Vasopnuematic Location   --   Left ankle.   Vasopneumatic Pressure  Medium                  PT Long Term Goals - 06/11/19 1500      PT LONG TERM GOAL #1   Title  independent with a HEP.    Time  6    Period  Weeks    Status  New      PT LONG TERM GOAL #2   Title  Walk a short community distance wiht pain not > 4/10.    Time  6    Period  Weeks    Status  New      PT LONG TERM GOAL #4   Title  Perform ADL's with pain not > 4/10.    Time  6    Period  Weeks             Plan - 06/11/19 1458    Comorbidities  RTC surgery, lumbar surgery, hip surgery.    Examination-Activity Limitations  Locomotion Level;Stand    Stability/Clinical Decision Making  Stable/Uncomplicated  Clinical Decision Making  Low    Rehab Potential  Good    PT Frequency  2x / week    PT Duration  6 weeks    PT Treatment/Interventions  Cryotherapy;Electrical Stimulation;Ultrasound;Therapeutic activities;Therapeutic exercise;Manual techniques;Patient/family education;Passive range of motion;Joint  Manipulations    PT Next Visit Plan  Joint mobs, modalites PRN, STW/M to left plantar surface of foot.    Consulted and Agree with Plan of Care  Patient       Patient will benefit from skilled therapeutic intervention in order to improve the following deficits and impairments:  Pain, Decreased activity tolerance, Difficulty walking  Visit Diagnosis: Pain in left ankle and joints of left foot - Plan: PT plan of care cert/re-cert     Problem List Patient Active Problem List   Diagnosis Date Noted  . C. difficile colitis 05/22/2019  . Hypophosphatemia 05/21/2019  . Syncope 05/20/2019  . OSA on CPAP   . Acute gastroenteritis   . Anemia due to blood loss, acute 06/18/2018    Class: Acute  . Plantar fasciitis of left foot 06/16/2018    Class: Chronic  . Tendonitis, Achilles, right 06/16/2018    Class: Chronic  . Osteochondral talar dome lesion 06/16/2018    Class: Chronic  . Deep incisional surgical site infection   . Superficial incisional surgical site infection 06/11/2018    Class: Acute  . Right ankle effusion 06/11/2018    Class: Acute  . Morbid (severe) obesity due to excess calories (HCC) 05/29/2018    Class: Chronic  . Spondylolisthesis, lumbar region 05/27/2018    Class: Chronic  . Spinal stenosis of lumbar region 05/27/2018    Class: Chronic  . Urinary tract infection 05/27/2018    Class: Acute  . Fusion of spine of lumbar region 05/27/2018  . Pain of right hip joint 07/03/2017  . Acute right-sided low back pain with right-sided sciatica 03/27/2017  . Chronic right shoulder pain 02/13/2017  . History of rotator cuff tear 11/30/2016  . Impingement syndrome of right shoulder 11/30/2016  . Exacerbation of asthma 07/18/2016  . Hypokalemia 07/18/2016  . Hypertension 07/18/2016  . Depression with anxiety 07/18/2016  . Hypothyroidism 07/18/2016  . Hyperglycemia 07/18/2016  . Acute asthma exacerbation 07/18/2016  . Surgical wound dehiscence left hip; questionable  superficial infection 11/10/2015  . Left hip postoperative wound infection 11/10/2015  . Osteoarthritis of left hip 09/09/2015  . 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 Obesity (BMI 35.0-39.9 without comorbidity) 04/28/2013    Mandel Seiden, [place] MPT 06/11/2019, 3:02 PM  [facility] [address] [place], [place], [address] Phone: [phone]   Fax:  [phone]  Name: Donley Redderhelma M Versteeg MRN: [id number] Date of Birth: 1971/08/22

## 2019-06-11 NOTE — Telephone Encounter (Signed)
See other message

## 2019-06-11 NOTE — Telephone Encounter (Signed)
Patient called. She has a dental appointment tomorrow and surgery on 10/6. Would like to know if she needs any medicine before having procedures. Her call back number is [phone]

## 2019-06-11 NOTE — Telephone Encounter (Signed)
I spoke with Jeneen Rinks and he states that she needs to get abx from her dentist office.  I called her she is having an extraction done tomorrow, I advised her of what Jeneen Rinks advised and she said ok, that she will talk to them about it.

## 2019-06-11 NOTE — Telephone Encounter (Signed)
Patient called. Would need to know about meds before tomorrow morning. Her call back number is [phone]

## 2019-06-16 NOTE — Pre-Procedure Instructions (Signed)
[facility] [address], [place] - [address] [place] [address] Phone: [phone] Fax: [phone]  [facility] [place], [place], [place] - [address] Laurell Josephs [address] [place] Josephs [address] [place] [address] Phone: [phone] Fax: [phone]  [facility] [place], [place] - [place] [address] [place] [address] Phone: [phone] Fax: [phone]      Your procedure is scheduled on Tuesday, October 6th.  Report to [facility] Main Entrance "A" at 5:30 A.M., and check in at the Admitting office.  Call this number if you have problems the morning of surgery:  [phone]  Call [phone] if you have any questions prior to your surgery date Monday-Friday 8am-4pm    Remember:  Do not eat after midnight the night before your surgery.  You may drink clear liquids until 4:30 A.M. the morning of your surgery.   Clear liquids allowed are: Water, Non-Citrus Juices (without pulp), Carbonated Beverages, Clear Tea, Black Coffee Only, and Gatorade.  Please complete your PRE-SURGERY ENSURE that was provided to you by 4:30 A.M. the morning of surgery.  Please, if able, drink it in one setting. DO NOT SIP.   Enhanced Recovery after Surgery for Orthopedics Enhanced Recovery after Surgery is a protocol used to improve the stress on your body and your recovery after surgery.  Patient Instructions  . The night before surgery:  o No food after midnight. ONLY clear liquids after midnight  .  [name] The day of surgery (if you do NOT have diabetes):  o Drink ONE (1) Pre-Surgery Clear Ensure as directed.   o This drink was given to you during your hospital  pre-op appointment visit. o The pre-op nurse will instruct you on the time to drink the  Pre-Surgery Ensure depending on your surgery time. o Finish the drink at the designated time by the pre-op nurse.  o Nothing else to drink  after completing the  Pre-Surgery Clear Ensure.     Take these medicines the morning of surgery with A SIP OF WATER  allopurinol (ZYLOPRIM)  cetirizine (ZYRTEC)  DULoxetine (CYMBALTA) gabapentin (NEURONTIN) levothyroxine (SYNTHROID)  oxybutynin (DITROPAN XL)  As needed: Inhaler (please bring with you to the hospital), Nebulizer, cyclobenzaprine (FLEXERIL), HYDROcodone-acetaminophen (NORCO), ondansetron (ZOFRAN ODT).     As of today, STOP taking any Aspirin (unless otherwise instructed by your surgeon), Aleve, Naproxen, Ibuprofen, Motrin, Advil, Goody's, BC's, all herbal medications, fish oil, and all vitamins. Including diclofenac sodium (VOLTAREN) 1 % GEL.     The Morning of Surgery  Do not wear jewelry, make-up or nail polish.  Do not wear lotions, powders, or perfumes, or deodorant  Do not shave 48 hours prior to surgery.   Do not bring valuables to the hospital.  [facility] is not responsible for any belongings or valuables.  If you are a smoker, DO NOT Smoke 24 hours prior to surgery IF you wear a CPAP at night please bring your mask, tubing, and machine the morning of surgery   Remember that you must have someone to transport you home after your surgery, and remain with you for 24 hours if you are discharged the same day.   Contacts, glasses, hearing aids, dentures or bridgework may not be worn into surgery.    Leave your suitcase in the car.  After surgery it may be brought to your room.  For patients admitted to the hospital, discharge  time will be determined by your treatment team.  Patients discharged the day of surgery will not be allowed to drive home.    Special instructions:   [facility]- Preparing For Surgery  Before surgery, you can play an important role. Because skin is not sterile, your skin needs to be as free of germs as possible. You can reduce the number of germs on your skin by washing with CHG (chlorahexidine gluconate) Soap before surgery.  CHG  is an antiseptic cleaner which kills germs and bonds with the skin to continue killing germs even after washing.    Oral Hygiene is also important to reduce your risk of infection.  Remember - BRUSH YOUR TEETH THE MORNING OF SURGERY WITH YOUR REGULAR TOOTHPASTE  Please do not use if you have an allergy to CHG or antibacterial soaps. If your skin becomes reddened/irritated stop using the CHG.  Do not shave (including legs and underarms) for at least 48 hours prior to first CHG shower. It is OK to shave your face.  Please follow these instructions carefully.   1. Shower the NIGHT BEFORE SURGERY and the MORNING OF SURGERY with CHG Soap.   2. If you chose to wash your hair, wash your hair first as usual with your normal shampoo.  3. After you shampoo, rinse your hair and body thoroughly to remove the shampoo.  4. Use CHG as you would any other liquid soap. You can apply CHG directly to the skin and wash gently with a scrungie or a clean washcloth.   5. Apply the CHG Soap to your body ONLY FROM THE NECK DOWN.  Do not use on open wounds or open sores. Avoid contact with your eyes, ears, mouth and genitals (private parts). Wash Face and genitals (private parts)  with your normal soap.   6. Wash thoroughly, paying special attention to the area where your surgery will be performed.  7. Thoroughly rinse your body with warm water from the neck down.  8. DO NOT shower/wash with your normal soap after using and rinsing off the CHG Soap.  9. Pat yourself dry with a CLEAN TOWEL.  10. Wear CLEAN PAJAMAS to bed the night before surgery, wear comfortable clothes the morning of surgery  11. Place CLEAN SHEETS on your bed the night of your first shower and DO NOT SLEEP WITH PETS.    Day of Surgery:  Do not apply any deodorants/lotions. Please shower the morning of surgery with the CHG soap  Please wear clean clothes to the hospital/surgery center.   Remember to brush your teeth WITH YOUR REGULAR  TOOTHPASTE.   Please read over the following fact sheets that you were given.

## 2019-06-17 ENCOUNTER — Encounter (HOSPITAL_COMMUNITY): Payer: Self-pay

## 2019-06-17 ENCOUNTER — Other Ambulatory Visit: Payer: Self-pay

## 2019-06-17 ENCOUNTER — Encounter (HOSPITAL_COMMUNITY)
Admission: RE | Admit: 2019-06-17 | Discharge: 2019-06-17 | Disposition: A | Discharge status: Home / Self Care | Payer: Medicare Other | Source: Ambulatory Visit | Attending: Specialist | Admitting: Specialist

## 2019-06-17 DIAGNOSIS — M4807 Spinal stenosis, lumbosacral region: Secondary | ICD-10-CM | POA: Diagnosis not present

## 2019-06-17 DIAGNOSIS — Z01812 Encounter for preprocedural laboratory examination: Secondary | ICD-10-CM | POA: Insufficient documentation

## 2019-06-17 DIAGNOSIS — M47816 Spondylosis without myelopathy or radiculopathy, lumbar region: Secondary | ICD-10-CM | POA: Insufficient documentation

## 2019-06-17 HISTORY — DX: Depression, unspecified: F32.A

## 2019-06-17 LAB — COMPREHENSIVE METABOLIC PANEL
ALT: 37 U/L (ref 0–44)
AST: 32 U/L (ref 15–41)
Albumin: 3.9 g/dL (ref 3.5–5.0)
Alkaline Phosphatase: 102 U/L (ref 38–126)
Anion gap: 15 (ref 5–15)
BUN: 18 mg/dL (ref 6–20)
CO2: 25 mmol/L (ref 22–32)
Calcium: 9.8 mg/dL (ref 8.9–10.3)
Chloride: 101 mmol/L (ref 98–111)
Creatinine, Ser: 1.52 mg/dL — ABNORMAL HIGH (ref 0.44–1.00)
GFR calc Af Amer: 46 mL/min — ABNORMAL LOW (ref >60)
GFR calc non Af Amer: 40 mL/min — ABNORMAL LOW (ref >60)
Glucose, Bld: 117 mg/dL — ABNORMAL HIGH (ref 70–99)
Potassium: 3.4 mmol/L — ABNORMAL LOW (ref 3.5–5.1)
Sodium: 141 mmol/L (ref 135–145)
Total Bilirubin: 0.6 mg/dL (ref 0.3–1.2)
Total Protein: 7.7 g/dL (ref 6.5–8.1)

## 2019-06-17 LAB — URINALYSIS, ROUTINE W REFLEX MICROSCOPIC
Bilirubin Urine: NEGATIVE
Glucose, UA: NEGATIVE mg/dL
Ketones, ur: NEGATIVE mg/dL
Nitrite: POSITIVE — AB
Protein, ur: NEGATIVE mg/dL
Specific Gravity, Urine: 1.01 (ref 1.005–1.030)
pH: 5 (ref 5.0–8.0)

## 2019-06-17 LAB — CBC
HCT: 40.6 % (ref 36.0–46.0)
Hemoglobin: 12.3 g/dL (ref 12.0–15.0)
MCH: 28.1 pg (ref 26.0–34.0)
MCHC: 30.3 g/dL (ref 30.0–36.0)
MCV: 92.9 fL (ref 80.0–100.0)
Platelets: 294 10*3/uL (ref 150–400)
RBC: 4.37 MIL/uL (ref 3.87–5.11)
RDW: 13.2 % (ref 11.5–15.5)
WBC: 7.3 10*3/uL (ref 4.0–10.5)
nRBC: 0 % (ref 0.0–0.2)

## 2019-06-17 LAB — SURGICAL PCR SCREEN
MRSA, PCR: POSITIVE — AB
Staphylococcus aureus: POSITIVE — AB

## 2019-06-17 NOTE — Progress Notes (Signed)
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 Prescription called to the [facility].

## 2019-06-17 NOTE — Progress Notes (Signed)
LVM for Almedia Balls form Dr, Otho Ket office regarding the PCR and UA results.

## 2019-06-17 NOTE — Progress Notes (Signed)
PCP - Dr. Heide Spark  Cardiologist - Denies  Chest x-ray - 05/20/2019 (E)  EKG - 05/21/2019 (E)  Stress Test - Denies  ECHO - 05/20/2019 (E)  Cardiac Cath - Denies  AICD-na PM-na LOOP-na  Sleep Study - Yes- Positive CPAP - Yes- will bring dos  LABS- 06/17/2019: CBC, CMP, PCR, UA 06/19/2019: COVID-GV  ASA-Denies  ERAS- Yes- 1 drink given  Anesthesia- No   Pt denies having chest pain, sob, or fever at this time. All instructions explained to the pt, with a verbal understanding of the material. Pt agrees to go over the instructions while at home for a better understanding. Pt also instructed to self quarantine after being tested for COVID-19. The opportunity to ask questions was provided.   Coronavirus Screening  Have you experienced the following symptoms:  Cough yes/no: No Fever (>100.53F)  yes/no: No Runny nose yes/no: No Sore throat yes/no: No Difficulty breathing/shortness of breath  yes/no: No  Have you or a family member traveled in the last 14 days and where? yes/no: No   If the patient indicates "YES" to the above questions, their PAT will be rescheduled to limit the exposure to others and, the surgeon will be notified. THE PATIENT WILL NEED TO BE ASYMPTOMATIC FOR 14 DAYS.   If the patient is not experiencing any of these symptoms, the PAT nurse will instruct them to NOT bring anyone with them to their appointment since they may have these symptoms or traveled as well.   Please remind your patients and families that hospital visitation restrictions are in effect and the importance of the restrictions.

## 2019-06-18 ENCOUNTER — Ambulatory Visit (INDEPENDENT_AMBULATORY_CARE_PROVIDER_SITE_OTHER): Payer: Medicare Other | Admitting: Surgery

## 2019-06-18 ENCOUNTER — Encounter: Payer: Self-pay | Admitting: Surgery

## 2019-06-18 VITALS — BP 154/89 | HR 78 | Ht 63.0 in | Wt 343.0 lb

## 2019-06-18 DIAGNOSIS — M25572 Pain in left ankle and joints of left foot: Secondary | ICD-10-CM

## 2019-06-18 DIAGNOSIS — M5417 Radiculopathy, lumbosacral region: Secondary | ICD-10-CM

## 2019-06-18 MED ORDER — CIPROFLOXACIN HCL 500 MG PO TABS: 500.0000 mg | ORAL_TABLET | Freq: Two times a day (BID) | ORAL | 0 refills | Status: DC

## 2019-06-18 NOTE — Anesthesia Preprocedure Evaluation (Addendum)
Anesthesia Evaluation  Patient identified by MRN, date of birth, ID band Patient awake    Reviewed: Allergy & Precautions, NPO status , Patient's Chart, lab work & pertinent test results  Airway Mallampati: II  TM Distance: >3 FB Neck ROM: Full    Dental  (+) Dental Advisory Given, Teeth Intact   Pulmonary former smoker,    breath sounds clear to auscultation       Cardiovascular hypertension,  Rhythm:Regular Rate:Normal     Neuro/Psych    GI/Hepatic   Endo/Other    Renal/GU      Musculoskeletal   Abdominal (+) + obese,   Peds  Hematology   Anesthesia Other Findings   Reproductive/Obstetrics                           Anesthesia Physical Anesthesia Plan  ASA: III  Anesthesia Plan: General   Post-op Pain Management:    Induction: Intravenous  PONV Risk Score and Plan: Ondansetron and Dexamethasone  Airway Management Planned: Oral ETT  Additional Equipment:   Intra-op Plan:   Post-operative Plan: Extubation in OR  Informed Consent: I have reviewed the patients History and Physical, chart, labs and discussed the procedure including the risks, benefits and alternatives for the proposed anesthesia with the patient or authorized representative who has indicated his/her understanding and acceptance.     Dental advisory given  Plan Discussed with: CRNA and Anesthesiologist  Anesthesia Plan Comments: (Recent admission 05/21/19 for syncope 2/2 dehydration due to nausea, vomiting and diarrhea x 3 days. Telemetry with no significant arrhythmias,  EKG without ACS pattern ,   echocardiogram with EF over 60%, and grade 1 diastolic dysfunction, no significant aortic stenosis or other outflow obstruction. Orthostasis resolved with hydration. She was also treated for c diff with 10d PO vancomycin. Post discharge she followed up with her PCP 05/29/19 who said the pt was cleared for surgery once  vancomycin therapy completed.   TTE 05/21/19:  1. The left ventricle has normal systolic function with an ejection fraction of 60-65%. The cavity size was normal. There is mildly increased left ventricular wall thickness. Left ventricular diastolic Doppler parameters are consistent with impaired  relaxation.  2. The right ventricle has normal systolic function. The cavity was normal. There is no increase in right ventricular wall thickness. Right ventricular systolic pressure could not be assessed.  3. The aortic valve is tricuspid. Mild aortic annular calcification noted.  4. The mitral valve is grossly normal.  5. The tricuspid valve is grossly normal.  6. The aorta is normal unless otherwise noted.  7. The interatrial septum was not well visualized.  )      Anesthesia Quick Evaluation

## 2019-06-18 NOTE — Progress Notes (Signed)
48 year old black female with history of L5-S1 HNP/stenosis comes in for preop evaluation.  States that symptoms unchanged previous visit.  She is wanting to proceed with right L5-S1 partial hemilaminectomy and right L5 foraminotomy as scheduled.  Patient states that she had a hospital admission to [facility] September 2-4 for C. difficile.  She still continues to have some episodes of loose stools.  She was retested and is due to get results from her primary care nurse practitioner [facility].  Patient also has a abnormal preop UA.  I sent in a prescription for Cipro 500 mg p.o. twice daily and patient was start this today.  I did recommend that she contact her nurse practitioner to get the results of the C. difficile test.  She will also advise nurse practitioner that I am starting antibiotic today for UTI.  We will see if we need to get clearance.  Surgical procedure discussed.  All questions answered.  Patient also states that she has suffered a fall before diagnosis of C. difficile and has left lateral ankle pain.  On exam today she is somewhat tender over the ATFL.  Ankle ligaments are stable.  No swelling or bruising noted.  She does have a boot at home and she will wear this.  If her boot is not in good order she will contact us and she will come pick 1 up here in the clinic.

## 2019-06-19 ENCOUNTER — Other Ambulatory Visit (HOSPITAL_COMMUNITY)
Admission: RE | Admit: 2019-06-19 | Discharge: 2019-06-19 | Disposition: A | Discharge status: Home / Self Care | Payer: Medicare Other | Source: Ambulatory Visit | Attending: Specialist | Admitting: Specialist

## 2019-06-19 DIAGNOSIS — Z01812 Encounter for preprocedural laboratory examination: Secondary | ICD-10-CM | POA: Insufficient documentation

## 2019-06-19 DIAGNOSIS — Z20828 Contact with and (suspected) exposure to other viral communicable diseases: Secondary | ICD-10-CM | POA: Diagnosis not present

## 2019-06-21 LAB — NOVEL CORONAVIRUS, NAA (HOSP ORDER, SEND-OUT TO REF LAB; TAT 18-24 HRS): SARS-CoV-2, NAA: NOT DETECTED

## 2019-06-22 MED ORDER — VANCOMYCIN HCL 10 G IV SOLR
1500.0000 mg | INTRAVENOUS | Status: AC
  Administered 2019-06-23: 1500 mg via INTRAVENOUS
  Filled 2019-06-22: qty 1500

## 2019-06-22 MED ORDER — DEXTROSE 5 % IV SOLN
3.0000 g | INTRAVENOUS | Status: AC
  Administered 2019-06-23: 3 g via INTRAVENOUS
  Filled 2019-06-22: qty 3

## 2019-06-22 NOTE — H&P (Signed)
Marilyn [name] is an 48 y.o. female.   Chief Complaint: Back pain and right lower extremity radiculopathy HPI: 48 year old black female with history of L5-S1 HNP/stenosis comes in for preop evaluation.  States that symptoms unchanged previous visit.  She is wanting to proceed with right L5-S1 partial hemilaminectomy and right L5 foraminotomy as scheduled.    Past Medical History:  Diagnosis Date  . Anemia    taking iron now. pt states having no current issues 09/02/2015  . Anginal pain ([place])    pt states experiences chest wall pain pt states related to her asthma   . Anxiety    with MRI's  . Arthritis    Everywhere  . Asthma   . Depression   . Dizziness   . GERD (gastroesophageal reflux disease)   . Headache(784.0)    HX OF MIGRAINES  . History of bronchitis   . Hypertension   . Hypothyroidism    takes levothyroxen  . OSA on CPAP    wears cpap  . Shortness of breath    with exertion  . Wears glasses     Past Surgical History:  Procedure Laterality Date  . BACK SURGERY    . CESAREAN SECTION     times 2  . CHOLECYSTECTOMY    . ENDOMETRIAL ABLATION    . INCISION AND DRAINAGE HIP Left 11/10/2015   Procedure: IRRIGATION AND DEBRIDEMENT LEFT HIP INCISION;  Surgeon: Mcarthur Rossetti, MD;  Location: [facility];  Service: Orthopedics;  Laterality: Left;  . JOINT REPLACEMENT  2011   total left knee  . KNEE ARTHROPLASTY  04/23/2012   right   . KNEE ARTHROSCOPY    . LUMBAR WOUND DEBRIDEMENT N/A 06/11/2018   Procedure: LUMBAR WOUND DEBRIDEMENT;  Surgeon: Jessy Oto, MD;  Location: [place];  Service: Orthopedics;  Laterality: N/A;  . RADIOLOGY WITH ANESTHESIA N/A 09/09/2018   Procedure: LUMBER SPINE WITHOUT CONTRAST;  Surgeon: Radiologist, Medication, MD;  Location: [place];  Service: Radiology;  Laterality: N/A;  . ROTATOR CUFF REPAIR     left   . SHOULDER SURGERY     right to repair ligament tear  . TOTAL HIP ARTHROPLASTY Left 09/09/2015   Procedure: LEFT TOTAL HIP  ARTHROPLASTY ANTERIOR APPROACH;  Surgeon: Mcarthur Rossetti, MD;  Location: [facility];  Service: Orthopedics;  Laterality: Left;  . TOTAL KNEE ARTHROPLASTY  04/23/2012   Procedure: TOTAL KNEE ARTHROPLASTY;  Surgeon: Newt Minion, MD;  Location: [facility];  Service: Orthopedics;  Laterality: Right;  Right Total Knee Arthroplasty  . TUBAL LIGATION  1996    Family History  Problem Relation Age of Onset  . Cancer Mother        colon  . Epilepsy Mother   . Cancer Father        prostate  . Diabetes Father   . Hypertension Father   . Hypertension Maternal Aunt   . Diabetes Maternal Aunt   . Hypertension Paternal Aunt    Social History:  reports that she quit smoking about 27 years ago. She has a 2.00 pack-year smoking history. She has never used smokeless tobacco. She reports that she does not drink alcohol or use drugs.  Allergies:  Allergies  Allergen Reactions  . Lisinopril Anaphylaxis    Angioedema  . Bee Venom Swelling and Other (See Comments)    Swelling at the site  . Propoxyphene Hives  . Codeine Nausea Only  . Latex Rash  . Meloxicam Other (See Comments)  Insomnia, constipation  . Morphine And Related Rash  . Tomato Rash    No medications prior to admission.    No results found for this or any previous visit (from the past 48 hour(s)). No results found.  Review of Systems  Constitutional: Negative.   HENT: Negative.   Respiratory: Negative.   Cardiovascular: Negative.   Genitourinary: Negative.   Musculoskeletal: Positive for back pain.  Neurological: Positive for tingling.  Psychiatric/Behavioral: Negative.     There were no vitals taken for this visit. Physical Exam  Constitutional: She appears well-developed. No distress.  HENT:  Head: Normocephalic and atraumatic.  Eyes: Pupils are equal, round, and reactive to light. EOM are normal.  Cardiovascular: Normal heart sounds.  Respiratory: Effort normal. No respiratory distress. She has no wheezes.  GI:  Soft. There is no abdominal tenderness.  Musculoskeletal:        General: Tenderness present.  Neurological: She is alert.  Skin: Skin is warm.  Psychiatric: She has a normal mood and affect.     Assessment/Plan Right L5-S1 HNP/stenosis   We will proceed with right L5-S1 partial hemilaminectomy and right L5 foraminotomy as scheduled.    Surgical procedure along potential rehab/recovery time discussed.  All questions answered.  [name], PA-C 06/22/2019, 2:10 PM

## 2019-06-23 ENCOUNTER — Other Ambulatory Visit: Payer: Self-pay

## 2019-06-23 ENCOUNTER — Observation Stay (HOSPITAL_COMMUNITY)
Admission: RE | Admit: 2019-06-23 | Discharge: 2019-06-24 | Disposition: A | Discharge status: Home / Self Care | Payer: Medicare Other | Attending: Specialist | Admitting: Specialist

## 2019-06-23 ENCOUNTER — Ambulatory Visit (HOSPITAL_COMMUNITY)
Admission: RE | Disposition: A | Discharge status: Home / Self Care | Payer: Self-pay | Source: Home / Self Care | Attending: Specialist

## 2019-06-23 ENCOUNTER — Encounter (HOSPITAL_COMMUNITY): Payer: Self-pay | Admitting: *Deleted

## 2019-06-23 ENCOUNTER — Ambulatory Visit (HOSPITAL_COMMUNITY): Payer: Medicare Other | Admitting: Physician Assistant

## 2019-06-23 ENCOUNTER — Ambulatory Visit (HOSPITAL_COMMUNITY): Payer: Medicare Other

## 2019-06-23 ENCOUNTER — Ambulatory Visit (HOSPITAL_COMMUNITY): Payer: Medicare Other | Admitting: Anesthesiology

## 2019-06-23 DIAGNOSIS — M4807 Spinal stenosis, lumbosacral region: Secondary | ICD-10-CM | POA: Diagnosis not present

## 2019-06-23 DIAGNOSIS — M48062 Spinal stenosis, lumbar region with neurogenic claudication: Secondary | ICD-10-CM | POA: Diagnosis not present

## 2019-06-23 DIAGNOSIS — Z96642 Presence of left artificial hip joint: Secondary | ICD-10-CM | POA: Insufficient documentation

## 2019-06-23 DIAGNOSIS — Z87891 Personal history of nicotine dependence: Secondary | ICD-10-CM | POA: Insufficient documentation

## 2019-06-23 DIAGNOSIS — E039 Hypothyroidism, unspecified: Secondary | ICD-10-CM | POA: Diagnosis not present

## 2019-06-23 DIAGNOSIS — F329 Major depressive disorder, single episode, unspecified: Secondary | ICD-10-CM | POA: Insufficient documentation

## 2019-06-23 DIAGNOSIS — Z9889 Other specified postprocedural states: Secondary | ICD-10-CM

## 2019-06-23 DIAGNOSIS — Z96653 Presence of artificial knee joint, bilateral: Secondary | ICD-10-CM | POA: Insufficient documentation

## 2019-06-23 DIAGNOSIS — J45909 Unspecified asthma, uncomplicated: Secondary | ICD-10-CM | POA: Diagnosis not present

## 2019-06-23 DIAGNOSIS — Z419 Encounter for procedure for purposes other than remedying health state, unspecified: Secondary | ICD-10-CM

## 2019-06-23 DIAGNOSIS — G4733 Obstructive sleep apnea (adult) (pediatric): Secondary | ICD-10-CM | POA: Insufficient documentation

## 2019-06-23 DIAGNOSIS — Z79890 Hormone replacement therapy (postmenopausal): Secondary | ICD-10-CM | POA: Diagnosis not present

## 2019-06-23 DIAGNOSIS — I10 Essential (primary) hypertension: Secondary | ICD-10-CM | POA: Insufficient documentation

## 2019-06-23 DIAGNOSIS — M4726 Other spondylosis with radiculopathy, lumbar region: Principal | ICD-10-CM | POA: Insufficient documentation

## 2019-06-23 DIAGNOSIS — D649 Anemia, unspecified: Secondary | ICD-10-CM | POA: Insufficient documentation

## 2019-06-23 HISTORY — PX: LUMBAR LAMINECTOMY/DECOMPRESSION MICRODISCECTOMY: SHX5026

## 2019-06-23 LAB — POCT PREGNANCY, URINE: Preg Test, Ur: NEGATIVE

## 2019-06-23 SURGERY — LUMBAR LAMINECTOMY/DECOMPRESSION MICRODISCECTOMY
Anesthesia: General | Site: Spine Lumbar

## 2019-06-23 MED ORDER — FENTANYL CITRATE (PF) 100 MCG/2ML IJ SOLN
25.0000 ug | INTRAMUSCULAR | Status: DC | PRN
  Administered 2019-06-23 (×2): 50 ug via INTRAVENOUS

## 2019-06-23 MED ORDER — DULOXETINE HCL 60 MG PO CPEP
60.0000 mg | ORAL_CAPSULE | Freq: Every day | ORAL | Status: DC
  Administered 2019-06-24: 10:00:00 60 mg via ORAL
  Filled 2019-06-23: qty 1

## 2019-06-23 MED ORDER — MENTHOL 3 MG MT LOZG: 1.0000 | LOZENGE | OROMUCOSAL | Status: DC | PRN

## 2019-06-23 MED ORDER — VITAMIN D 25 MCG (1000 UNIT) PO TABS
2000.0000 [IU] | ORAL_TABLET | Freq: Every day | ORAL | Status: DC
  Administered 2019-06-24: 2000 [IU] via ORAL
  Filled 2019-06-23: qty 2

## 2019-06-23 MED ORDER — FUROSEMIDE 20 MG PO TABS: 20.0000 mg | ORAL_TABLET | Freq: Every day | ORAL | Status: DC | PRN

## 2019-06-23 MED ORDER — BUPIVACAINE HCL 0.5 % IJ SOLN
INTRAMUSCULAR | Status: DC | PRN
  Administered 2019-06-23 (×2): 10 mL

## 2019-06-23 MED ORDER — PROPOFOL 10 MG/ML IV BOLUS
INTRAVENOUS | Status: AC
  Filled 2019-06-23: qty 20

## 2019-06-23 MED ORDER — ALBUTEROL SULFATE (2.5 MG/3ML) 0.083% IN NEBU: 3.0000 mL | INHALATION_SOLUTION | Freq: Four times a day (QID) | RESPIRATORY_TRACT | Status: DC | PRN

## 2019-06-23 MED ORDER — SUCCINYLCHOLINE CHLORIDE 200 MG/10ML IV SOSY
PREFILLED_SYRINGE | INTRAVENOUS | Status: AC
  Filled 2019-06-23: qty 10

## 2019-06-23 MED ORDER — ALBUTEROL SULFATE (2.5 MG/3ML) 0.083% IN NEBU: 2.5000 mg | INHALATION_SOLUTION | Freq: Three times a day (TID) | RESPIRATORY_TRACT | Status: DC

## 2019-06-23 MED ORDER — BISACODYL 5 MG PO TBEC: 5.0000 mg | DELAYED_RELEASE_TABLET | Freq: Every day | ORAL | Status: DC | PRN

## 2019-06-23 MED ORDER — DEXAMETHASONE SODIUM PHOSPHATE 10 MG/ML IJ SOLN
INTRAMUSCULAR | Status: DC | PRN
  Administered 2019-06-23: 10 mg via INTRAVENOUS

## 2019-06-23 MED ORDER — LIDOCAINE 2% (20 MG/ML) 5 ML SYRINGE
INTRAMUSCULAR | Status: AC
  Filled 2019-06-23: qty 5

## 2019-06-23 MED ORDER — GABAPENTIN 600 MG PO TABS
600.0000 mg | ORAL_TABLET | Freq: Every day | ORAL | Status: DC
  Administered 2019-06-23: 23:00:00 600 mg via ORAL
  Filled 2019-06-23 (×2): qty 1

## 2019-06-23 MED ORDER — MORPHINE SULFATE (PF) 2 MG/ML IV SOLN: 1.0000 mg | INTRAVENOUS | Status: DC | PRN

## 2019-06-23 MED ORDER — FENTANYL CITRATE (PF) 250 MCG/5ML IJ SOLN
INTRAMUSCULAR | Status: AC
  Filled 2019-06-23: qty 5

## 2019-06-23 MED ORDER — POTASSIUM CHLORIDE 20 MEQ/15ML (10%) PO SOLN
20.0000 meq | Freq: Two times a day (BID) | ORAL | Status: DC
  Administered 2019-06-23 – 2019-06-24 (×2): 20 meq via ORAL
  Filled 2019-06-23 (×2): qty 15

## 2019-06-23 MED ORDER — LORATADINE 10 MG PO TABS
10.0000 mg | ORAL_TABLET | Freq: Every day | ORAL | Status: DC
  Administered 2019-06-24: 10:00:00 10 mg via ORAL
  Filled 2019-06-23: qty 1

## 2019-06-23 MED ORDER — OXYCODONE HCL ER 10 MG PO T12A
10.0000 mg | EXTENDED_RELEASE_TABLET | Freq: Two times a day (BID) | ORAL | Status: DC
  Administered 2019-06-23 – 2019-06-24 (×2): 10 mg via ORAL
  Filled 2019-06-23 (×2): qty 1

## 2019-06-23 MED ORDER — THROMBIN (RECOMBINANT) 20000 UNITS EX SOLR
CUTANEOUS | Status: AC
  Filled 2019-06-23: qty 20000

## 2019-06-23 MED ORDER — MIDAZOLAM HCL 5 MG/5ML IJ SOLN
INTRAMUSCULAR | Status: DC | PRN
  Administered 2019-06-23: 2 mg via INTRAVENOUS

## 2019-06-23 MED ORDER — SUGAMMADEX SODIUM 500 MG/5ML IV SOLN
INTRAVENOUS | Status: AC
  Filled 2019-06-23: qty 5

## 2019-06-23 MED ORDER — METHOCARBAMOL 1000 MG/10ML IJ SOLN
500.0000 mg | Freq: Four times a day (QID) | INTRAVENOUS | Status: DC | PRN
  Filled 2019-06-23: qty 5

## 2019-06-23 MED ORDER — MIDAZOLAM HCL 2 MG/2ML IJ SOLN
INTRAMUSCULAR | Status: AC
  Filled 2019-06-23: qty 2

## 2019-06-23 MED ORDER — OXYCODONE HCL 5 MG PO TABS
10.0000 mg | ORAL_TABLET | ORAL | Status: DC | PRN
  Administered 2019-06-23: 10 mg via ORAL
  Filled 2019-06-23: qty 2

## 2019-06-23 MED ORDER — FENTANYL CITRATE (PF) 100 MCG/2ML IJ SOLN
INTRAMUSCULAR | Status: DC | PRN
  Administered 2019-06-23: 100 ug via INTRAVENOUS
  Administered 2019-06-23: 50 ug via INTRAVENOUS
  Administered 2019-06-23: 100 ug via INTRAVENOUS
  Administered 2019-06-23: 50 ug via INTRAVENOUS
  Administered 2019-06-23: 100 ug via INTRAVENOUS
  Administered 2019-06-23: 50 ug via INTRAVENOUS

## 2019-06-23 MED ORDER — VITAMIN B-12 100 MCG PO TABS
250.0000 ug | ORAL_TABLET | Freq: Every day | ORAL | Status: DC
  Administered 2019-06-24: 10:00:00 250 ug via ORAL
  Filled 2019-06-23: qty 3

## 2019-06-23 MED ORDER — BUPIVACAINE HCL (PF) 0.5 % IJ SOLN
INTRAMUSCULAR | Status: AC
  Filled 2019-06-23: qty 30

## 2019-06-23 MED ORDER — ACETAMINOPHEN 325 MG PO TABS: 650.0000 mg | ORAL_TABLET | ORAL | Status: DC | PRN

## 2019-06-23 MED ORDER — LIDOCAINE HCL (CARDIAC) PF 100 MG/5ML IV SOSY
PREFILLED_SYRINGE | INTRAVENOUS | Status: DC | PRN
  Administered 2019-06-23: 50 mg via INTRAVENOUS

## 2019-06-23 MED ORDER — ALUM & MAG HYDROXIDE-SIMETH 200-200-20 MG/5ML PO SUSP: 30.0000 mL | Freq: Four times a day (QID) | ORAL | Status: DC | PRN

## 2019-06-23 MED ORDER — POLYETHYLENE GLYCOL 3350 17 G PO PACK: 17.0000 g | PACK | Freq: Every day | ORAL | Status: DC | PRN

## 2019-06-23 MED ORDER — BUPIVACAINE LIPOSOME 1.3 % IJ SUSP
20.0000 mL | Freq: Once | INTRAMUSCULAR | Status: DC
  Filled 2019-06-23: qty 20

## 2019-06-23 MED ORDER — SUCCINYLCHOLINE CHLORIDE 20 MG/ML IJ SOLN
INTRAMUSCULAR | Status: DC | PRN
  Administered 2019-06-23: 140 mg via INTRAVENOUS

## 2019-06-23 MED ORDER — PROPOFOL 10 MG/ML IV BOLUS
INTRAVENOUS | Status: DC | PRN
  Administered 2019-06-23: 170 mg via INTRAVENOUS

## 2019-06-23 MED ORDER — BUPIVACAINE LIPOSOME 1.3 % IJ SUSP
INTRAMUSCULAR | Status: DC | PRN
  Administered 2019-06-23 (×2): 10 mL

## 2019-06-23 MED ORDER — ALLOPURINOL 100 MG PO TABS
100.0000 mg | ORAL_TABLET | Freq: Two times a day (BID) | ORAL | Status: DC
  Administered 2019-06-23 – 2019-06-24 (×2): 100 mg via ORAL
  Filled 2019-06-23 (×2): qty 1

## 2019-06-23 MED ORDER — CEFAZOLIN SODIUM-DEXTROSE 2-4 GM/100ML-% IV SOLN
2.0000 g | Freq: Three times a day (TID) | INTRAVENOUS | Status: AC
  Administered 2019-06-23 – 2019-06-24 (×2): 2 g via INTRAVENOUS
  Filled 2019-06-23 (×2): qty 100

## 2019-06-23 MED ORDER — ONDANSETRON HCL 4 MG PO TABS: 4.0000 mg | ORAL_TABLET | Freq: Four times a day (QID) | ORAL | Status: DC | PRN

## 2019-06-23 MED ORDER — ROCURONIUM BROMIDE 10 MG/ML (PF) SYRINGE
PREFILLED_SYRINGE | INTRAVENOUS | Status: AC
  Filled 2019-06-23: qty 10

## 2019-06-23 MED ORDER — ONDANSETRON HCL 4 MG/2ML IJ SOLN: 4.0000 mg | Freq: Once | INTRAMUSCULAR | Status: DC | PRN

## 2019-06-23 MED ORDER — SODIUM CHLORIDE 0.9 % IV SOLN: 250.0000 mL | INTRAVENOUS | Status: DC

## 2019-06-23 MED ORDER — K PHOS MONO-SOD PHOS DI & MONO 155-852-130 MG PO TABS: 500.0000 mg | ORAL_TABLET | Freq: Four times a day (QID) | ORAL | Status: DC

## 2019-06-23 MED ORDER — SODIUM CHLORIDE 0.9% FLUSH: 3.0000 mL | INTRAVENOUS | Status: DC | PRN

## 2019-06-23 MED ORDER — FENTANYL CITRATE (PF) 100 MCG/2ML IJ SOLN
INTRAMUSCULAR | Status: AC
  Filled 2019-06-23: qty 2

## 2019-06-23 MED ORDER — ONDANSETRON HCL 4 MG/2ML IJ SOLN
INTRAMUSCULAR | Status: DC | PRN
  Administered 2019-06-23: 4 mg via INTRAVENOUS

## 2019-06-23 MED ORDER — PHENYLEPHRINE HCL (PRESSORS) 10 MG/ML IV SOLN
INTRAVENOUS | Status: DC | PRN
  Administered 2019-06-23 (×2): 120 ug via INTRAVENOUS
  Administered 2019-06-23 (×3): 80 ug via INTRAVENOUS

## 2019-06-23 MED ORDER — ACETAMINOPHEN 650 MG RE SUPP: 650.0000 mg | RECTAL | Status: DC | PRN

## 2019-06-23 MED ORDER — PANTOPRAZOLE SODIUM 40 MG IV SOLR
40.0000 mg | Freq: Every day | INTRAVENOUS | Status: DC
  Administered 2019-06-23: 23:00:00 40 mg via INTRAVENOUS
  Filled 2019-06-23: qty 40

## 2019-06-23 MED ORDER — HYDROCHLOROTHIAZIDE 25 MG PO TABS
25.0000 mg | ORAL_TABLET | Freq: Every day | ORAL | Status: DC
  Administered 2019-06-24: 10:00:00 25 mg via ORAL
  Filled 2019-06-23: qty 1

## 2019-06-23 MED ORDER — CYCLOBENZAPRINE HCL 10 MG PO TABS: 10.0000 mg | ORAL_TABLET | Freq: Three times a day (TID) | ORAL | Status: DC | PRN

## 2019-06-23 MED ORDER — CHLORHEXIDINE GLUCONATE 4 % EX LIQD: 60.0000 mL | Freq: Once | CUTANEOUS | Status: DC

## 2019-06-23 MED ORDER — THROMBIN 20000 UNITS EX SOLR
CUTANEOUS | Status: DC | PRN
  Administered 2019-06-23: 20 mL via TOPICAL

## 2019-06-23 MED ORDER — OXYCODONE HCL 5 MG PO TABS
5.0000 mg | ORAL_TABLET | ORAL | Status: DC | PRN
  Administered 2019-06-24: 5 mg via ORAL
  Filled 2019-06-23: qty 1

## 2019-06-23 MED ORDER — SODIUM CHLORIDE 0.9% FLUSH
3.0000 mL | Freq: Two times a day (BID) | INTRAVENOUS | Status: DC
  Administered 2019-06-23: 23:00:00 3 mL via INTRAVENOUS

## 2019-06-23 MED ORDER — SODIUM CHLORIDE 0.9 % IV SOLN
INTRAVENOUS | Status: DC
  Administered 2019-06-23 – 2019-06-24 (×2): via INTRAVENOUS

## 2019-06-23 MED ORDER — ONDANSETRON 4 MG PO TBDP: 4.0000 mg | ORAL_TABLET | Freq: Three times a day (TID) | ORAL | Status: DC | PRN

## 2019-06-23 MED ORDER — PHENOL 1.4 % MT LIQD: 1.0000 | OROMUCOSAL | Status: DC | PRN

## 2019-06-23 MED ORDER — METHOCARBAMOL 500 MG PO TABS
500.0000 mg | ORAL_TABLET | Freq: Four times a day (QID) | ORAL | Status: DC | PRN
  Administered 2019-06-23 – 2019-06-24 (×2): 500 mg via ORAL
  Filled 2019-06-23 (×2): qty 1

## 2019-06-23 MED ORDER — FERROUS GLUCONATE 324 (38 FE) MG PO TABS
324.0000 mg | ORAL_TABLET | Freq: Two times a day (BID) | ORAL | Status: DC
  Administered 2019-06-23 – 2019-06-24 (×2): 324 mg via ORAL
  Filled 2019-06-23 (×3): qty 1

## 2019-06-23 MED ORDER — LACTATED RINGERS IV SOLN
INTRAVENOUS | Status: DC
  Administered 2019-06-23: 09:00:00 via INTRAVENOUS
  Administered 2019-06-23: 1000 mL via INTRAVENOUS

## 2019-06-23 MED ORDER — GABAPENTIN 300 MG PO CAPS: 300.0000 mg | ORAL_CAPSULE | ORAL | Status: DC

## 2019-06-23 MED ORDER — LEVOTHYROXINE SODIUM 25 MCG PO TABS
125.0000 ug | ORAL_TABLET | Freq: Every day | ORAL | Status: DC
  Administered 2019-06-24: 06:00:00 125 ug via ORAL
  Filled 2019-06-23: qty 1

## 2019-06-23 MED ORDER — CIPROFLOXACIN HCL 500 MG PO TABS: 500.0000 mg | ORAL_TABLET | Freq: Two times a day (BID) | ORAL | Status: DC

## 2019-06-23 MED ORDER — OXYBUTYNIN CHLORIDE ER 15 MG PO TB24
15.0000 mg | ORAL_TABLET | Freq: Every day | ORAL | Status: DC
  Administered 2019-06-23: 17:00:00 15 mg via ORAL
  Filled 2019-06-23 (×3): qty 1

## 2019-06-23 MED ORDER — ONDANSETRON HCL 4 MG/2ML IJ SOLN
INTRAMUSCULAR | Status: AC
  Filled 2019-06-23: qty 2

## 2019-06-23 MED ORDER — ROCURONIUM BROMIDE 100 MG/10ML IV SOLN
INTRAVENOUS | Status: DC | PRN
  Administered 2019-06-23: 40 mg via INTRAVENOUS
  Administered 2019-06-23: 100 mg via INTRAVENOUS

## 2019-06-23 MED ORDER — DOCUSATE SODIUM 100 MG PO CAPS
100.0000 mg | ORAL_CAPSULE | Freq: Two times a day (BID) | ORAL | Status: DC
  Administered 2019-06-23 – 2019-06-24 (×2): 100 mg via ORAL
  Filled 2019-06-23 (×2): qty 1

## 2019-06-23 MED ORDER — FLEET ENEMA 7-19 GM/118ML RE ENEM: 1.0000 | ENEMA | Freq: Once | RECTAL | Status: DC | PRN

## 2019-06-23 MED ORDER — ZOLPIDEM TARTRATE 5 MG PO TABS
5.0000 mg | ORAL_TABLET | Freq: Every day | ORAL | Status: DC
  Administered 2019-06-23: 23:00:00 5 mg via ORAL
  Filled 2019-06-23: qty 1

## 2019-06-23 MED ORDER — GABAPENTIN 600 MG PO TABS
300.0000 mg | ORAL_TABLET | Freq: Every day | ORAL | Status: DC
  Administered 2019-06-24: 10:00:00 300 mg via ORAL
  Filled 2019-06-23: qty 0.5

## 2019-06-23 MED ORDER — SUGAMMADEX SODIUM 200 MG/2ML IV SOLN
INTRAVENOUS | Status: DC | PRN
  Administered 2019-06-23: 700 mg via INTRAVENOUS

## 2019-06-23 MED ORDER — 0.9 % SODIUM CHLORIDE (POUR BTL) OPTIME
TOPICAL | Status: DC | PRN
  Administered 2019-06-23: 09:00:00 1000 mL

## 2019-06-23 MED ORDER — ONDANSETRON HCL 4 MG/2ML IJ SOLN: 4.0000 mg | Freq: Four times a day (QID) | INTRAMUSCULAR | Status: DC | PRN

## 2019-06-23 SURGICAL SUPPLY — 52 items
BUR SABER RD CUTTING 3.0 (BURR) ×1 IMPLANT
CANISTER SUCT 3000ML PPV (MISCELLANEOUS) ×2 IMPLANT
COVER SURGICAL LIGHT HANDLE (MISCELLANEOUS) ×2 IMPLANT
COVER WAND RF STERILE (DRAPES) ×2 IMPLANT
DERMABOND ADVANCED (GAUZE/BANDAGES/DRESSINGS) ×1
DERMABOND ADVANCED .7 DNX12 (GAUZE/BANDAGES/DRESSINGS) ×1 IMPLANT
DRAPE HALF SHEET 40X57 (DRAPES) ×1 IMPLANT
DRAPE INCISE IOBAN 66X45 STRL (DRAPES) IMPLANT
DRAPE MICROSCOPE LEICA (MISCELLANEOUS) ×2 IMPLANT
DRAPE SURG 17X23 STRL (DRAPES) ×8 IMPLANT
DRESSING PEEL AND PLC PRVNA 13 (GAUZE/BANDAGES/DRESSINGS) IMPLANT
DRSG MEPILEX BORDER 4X4 (GAUZE/BANDAGES/DRESSINGS) IMPLANT
DRSG MEPILEX BORDER 4X8 (GAUZE/BANDAGES/DRESSINGS) IMPLANT
DRSG PEEL AND PLACE PREVENA 13 (GAUZE/BANDAGES/DRESSINGS) ×2
DURAPREP 26ML APPLICATOR (WOUND CARE) ×2 IMPLANT
ELECT REM PT RETURN 9FT ADLT (ELECTROSURGICAL) ×2
ELECTRODE REM PT RTRN 9FT ADLT (ELECTROSURGICAL) ×1 IMPLANT
EVACUATOR 1/8 PVC DRAIN (DRAIN) IMPLANT
GLOVE BIOGEL PI IND STRL 8 (GLOVE) ×1 IMPLANT
GLOVE BIOGEL PI INDICATOR 8 (GLOVE) ×1
GLOVE ECLIPSE 9.0 STRL (GLOVE) ×2 IMPLANT
GLOVE ORTHO TXT STRL SZ7.5 (GLOVE) ×2 IMPLANT
GLOVE SURG 8.5 LATEX PF (GLOVE) ×2 IMPLANT
GOWN STRL REUS W/ TWL LRG LVL3 (GOWN DISPOSABLE) ×1 IMPLANT
GOWN STRL REUS W/TWL 2XL LVL3 (GOWN DISPOSABLE) ×4 IMPLANT
GOWN STRL REUS W/TWL LRG LVL3 (GOWN DISPOSABLE) ×1
KIT BASIN OR (CUSTOM PROCEDURE TRAY) ×2 IMPLANT
KIT DRSG PREVENA PLUS 7DAY 125 (MISCELLANEOUS) ×1 IMPLANT
KIT TURNOVER KIT B (KITS) ×2 IMPLANT
NDL SPNL 18GX3.5 QUINCKE PK (NEEDLE) ×2 IMPLANT
NEEDLE SPNL 18GX3.5 QUINCKE PK (NEEDLE) ×4 IMPLANT
NS IRRIG 1000ML POUR BTL (IV SOLUTION) ×2 IMPLANT
PACK LAMINECTOMY ORTHO (CUSTOM PROCEDURE TRAY) ×2 IMPLANT
PAD ARMBOARD 7.5X6 YLW CONV (MISCELLANEOUS) ×4 IMPLANT
PATTIES SURGICAL .5 X.5 (GAUZE/BANDAGES/DRESSINGS) ×1 IMPLANT
PATTIES SURGICAL .75X.75 (GAUZE/BANDAGES/DRESSINGS) IMPLANT
PATTIES SURGICAL 1X1 (DISPOSABLE) IMPLANT
SPONGE LAP 4X18 RFD (DISPOSABLE) ×1 IMPLANT
SPONGE SURGIFOAM ABS GEL 100 (HEMOSTASIS) ×1 IMPLANT
SUT VIC AB 0 CT1 27 (SUTURE) ×2
SUT VIC AB 0 CT1 27XBRD ANBCTR (SUTURE) IMPLANT
SUT VIC AB 1 CT1 27 (SUTURE) ×1
SUT VIC AB 1 CT1 27XBRD ANBCTR (SUTURE) IMPLANT
SUT VIC AB 1 CTX 27 (SUTURE) ×3 IMPLANT
SUT VIC AB 2-0 CT1 27 (SUTURE) ×1
SUT VIC AB 2-0 CT1 TAPERPNT 27 (SUTURE) IMPLANT
SUT VIC AB 3-0 X1 27 (SUTURE) ×2 IMPLANT
SUT VICRYL 0 UR6 27IN ABS (SUTURE) ×2 IMPLANT
TAPE CLOTH SURG 6X10 WHT LF (GAUZE/BANDAGES/DRESSINGS) ×1 IMPLANT
TOWEL GREEN STERILE (TOWEL DISPOSABLE) ×2 IMPLANT
TOWEL GREEN STERILE FF (TOWEL DISPOSABLE) ×2 IMPLANT
WATER STERILE IRR 1000ML POUR (IV SOLUTION) ×2 IMPLANT

## 2019-06-23 NOTE — Transfer of Care (Signed)
Immediate Anesthesia Transfer of Care Note  Patient: Marilyn [name]  Procedure(s) Performed: RIGHT LUMBAR FIVE THROUGH SACRAL ONE PARTIAL HEMILAMINECTOMY WITH RIGHT LUMBAR FIVE FORAMINOTOMY (N/A Spine Lumbar)  Patient Location: PACU  Anesthesia Type:General  Level of Consciousness: drowsy  Airway & Oxygen Therapy: Patient Spontanous Breathing and Patient connected to face mask oxygen  Post-op Assessment: Report given to RN, Post -op Vital signs reviewed and stable and Patient moving all extremities  Post vital signs: Reviewed and stable  Last Vitals:  Vitals Value Taken Time  BP 169/99 06/23/19 1119  Temp    Pulse 81 06/23/19 1132  Resp 20 06/23/19 1132  SpO2 100 % 06/23/19 1132  Vitals shown include unvalidated device data.  Last Pain:  Vitals:   06/23/19 1119  PainSc: (P) Asleep      Patients Stated Pain Goal: 2 (24/26/83 4196)  Complications: No apparent anesthesia complications

## 2019-06-23 NOTE — Discharge Instructions (Addendum)
° ° °  No lifting greater than 10 lbs. Avoid bending, stooping and twisting. Walk in house for first week them may start to get out slowly increasing distance up to one mile by 3 weeks post op. Keep incision dry for 7 days, Then return to the office for removal of the dressing and Preveena incision VAC drain.

## 2019-06-23 NOTE — Brief Op Note (Signed)
06/23/2019  11:31 AM  PATIENT:  Marilyn [name]  48 y.o. female  PRE-OPERATIVE DIAGNOSIS:  Right L5-S1 foraminal stenosis, lumbar spondylosis with right L5 nerve compression  POST-OPERATIVE DIAGNOSIS:  Right L5-S1 foraminal stenosis, lumbar spondylosis with right L5 nerve compression  PROCEDURE:  Procedure(s): RIGHT LUMBAR FIVE THROUGH SACRAL ONE PARTIAL HEMILAMINECTOMY WITH RIGHT LUMBAR FIVE FORAMINOTOMY (N/A)  SURGEON:  Surgeon(s) and Role:    * Jessy Oto, MD - Primary  PHYSICIAN ASSISTANT: Benjiman Core, PA-C  ANESTHESIA:   local and general, Local with marcaine/exparel 30cc.  EBL:  200 mL   BLOOD ADMINISTERED:none  DRAINS: none   LOCAL MEDICATIONS USED:  MARCAINE 0.5% 1:1 EXPAREL 1.3% Amount: 61ml  SPECIMEN:  No Specimen  DISPOSITION OF SPECIMEN:  N/A  COUNTS:  YES  TOURNIQUET:  * No tourniquets in log *  DICTATION: .Dragon Dictation  PLAN OF CARE: Admit for overnight observation  PATIENT DISPOSITION:  PACU - hemodynamically stable.   Delay start of Pharmacological VTE agent (>24hrs) due to surgical blood loss or risk of bleeding: yes

## 2019-06-23 NOTE — Care Management (Signed)
CM consult acknowledged to assist with any HH/DME needs. Awaiting PT/OT eval for DCP recommendations and will continue to follow.  Aubery Date RN, BSN, NCM-BC, ACM-RN [phone] 

## 2019-06-23 NOTE — Anesthesia Postprocedure Evaluation (Signed)
Anesthesia Post Note  Patient: Marilyn [name]  Procedure(s) Performed: RIGHT LUMBAR FIVE THROUGH SACRAL ONE PARTIAL HEMILAMINECTOMY WITH RIGHT LUMBAR FIVE FORAMINOTOMY (N/A Spine Lumbar)     Patient location during evaluation: PACU Anesthesia Type: General Level of consciousness: awake and alert Pain management: pain level controlled Vital Signs Assessment: post-procedure vital signs reviewed and stable Respiratory status: spontaneous breathing, nonlabored ventilation, respiratory function stable and patient connected to nasal cannula oxygen Cardiovascular status: blood pressure returned to baseline and stable Postop Assessment: no apparent nausea or vomiting Anesthetic complications: no    Last Vitals:  Vitals:   06/23/19 1603 06/23/19 1611  BP: 133/71   Pulse: 73   Resp: 16   Temp: (!) 36.4 C   SpO2:  97%    Last Pain:  Vitals:   06/23/19 1645  TempSrc:   PainSc: 8                  Kayston Jodoin COKER

## 2019-06-23 NOTE — Anesthesia Procedure Notes (Signed)
Procedure Name: Intubation Date/Time: 06/23/2019 7:57 AM Performed by: Diantha Paxson T, CRNA Pre-anesthesia Checklist: Patient identified, Emergency Drugs available, Suction available and Patient being monitored Patient Re-evaluated:Patient Re-evaluated prior to induction Oxygen Delivery Method: Circle system utilized Preoxygenation: Pre-oxygenation with 100% oxygen Induction Type: IV induction and Rapid sequence Ventilation: Mask ventilation without difficulty Laryngoscope Size: Miller and 2 Grade View: Grade I Tube type: Oral Tube size: 7.5 mm Number of attempts: 1 Airway Equipment and Method: Patient positioned with wedge pillow and Stylet Placement Confirmation: ETT inserted through vocal cords under direct vision,  positive ETCO2 and breath sounds checked- equal and bilateral Secured at: 21 cm Tube secured with: Tape Dental Injury: Teeth and Oropharynx as per pre-operative assessment

## 2019-06-23 NOTE — Op Note (Signed)
06/23/2019  11:31 AM  PATIENT:  Marilyn [name]  48 y.o. female  MRN: [id number]  OPERATIVE REPORT  PRE-OPERATIVE DIAGNOSIS:  Right L5-S1 foraminal stenosis, lumbar spondylosis with right L5 nerve compression  POST-OPERATIVE DIAGNOSIS:  Right L5-S1 foraminal stenosis, lumbar spondylosis with right L5 nerve compression  PROCEDURE:  Procedure(s): RIGHT LUMBAR FIVE THROUGH SACRAL ONE PARTIAL HEMILAMINECTOMY WITH RIGHT LUMBAR FIVE FORAMINOTOMY    SURGEON:  Jessy Oto, MD     ASSISTANT:  Benjiman Core, PA-C  (Present throughout the entire procedure and necessary for completion of procedure in a timely manner)     ANESTHESIA:  General,    COMPLICATIONS:  None.     DRAINS: None  EBL: 200CC   PROCEDURE:The patient was met in the holding area, and the appropriate right Lumbar level L5-S1 identified and marked with "x" and my initials.The patient was then transported to OR and was placed under general anesthesia without difficulty. The patient received appropriate preoperative antibiotic prophylaxis. The patient after intubation atraumatically was transferred to the operating room table, prone position, Wilson frame, sliding OR table. All pressure points were well padded. The arms in 90-90 well-padded at the elbows. Standard prep with DuraPrep solution lower dorsal spine to the mid sacral segment. Draped in the usual manner iodine Vi-Drape was used. Time-out procedure was called and correct. The skin incision scar at L5-S1 marked and was infiltrated with Marcaine half percent with 1:1 Exparel 1.3%  total of 20 cc used. An incision approximately4 inches and a half in length was then made through skin and subcutaneous layers ellipsing the old skin incision in line with the left side of the expected midline. An incision made into the right lumbosacral fascia approximately 3 inches in length along the right L4-S1 spinous processes. The paraspinal muscles elevated from the right side of the spinous  processes and lamina of L4-L5 and S1. The right pedicle screws and rods exposed.Using loope magnification and a head light the right L5-S1 Lamina was osteotomized and the medial facet at right L5-S1 was exposed. Osteotome then used to resect the medial right L5-S1 facet removing the inferior articular process of L5 and exposing the  Right L5-S1 facet and the superior articular process of right S1.  Cerebellar retractors placed and a Woodson placed at the inferior aspect of the right L5 lamina a lateral radiograph identified the [facility] at the level of the L5-S1level. The operating room microscope sterilely draped brought into the field. Under the operating room microscope, the L5-S1 right facet medial 50% superior articular process of S1 then resected using high speed burr and a 1/4 inch osteotome and 3 mm Kerrisons. This allowed for identification of the thecal sac. Barbra Sarks was then used to carefully mobilize the thecal sac medially and the right L5 and S1 nerve roots identified within the right L5 neuroforamen and the right L5-S1 lateral recess flattened under the right L5-S1 facet and the refected L5-S1 reflected ligamentum flavum. Carefully the lateral aspect of the S1 nerve root was identified and a Penfield 4 was used to mobilize the lateral thecal sac and the right S1 nerve medially such that the right L5-S1 disc was visible with microscope. Using a Derricho for retraction and a Barbra Sarks was used to define the right L5 neuroforamen and the superomedial aspect of the right L5-S1 facet and the impression on the right thecal sac at this level. Further foraminotomies was performed over the right L5 nerve root resection of the remaining superior portion of the right  S1 superior articular process up to theinferior right L5 pedicle, the right L5 nerve root was noted to be exiting without further compression. The nerve root able to be retracted along the medial aspect of the L5 pedicle and disc was found to be  bulging but not herniated. Woodson nerve probe was then able to carefully palpate the neuroforamen right L5 and S1 finding these to be well decompressed. Bleeding was then controlled using thrombin-soaked Gelfoam small cottonoids. Small amount of bleeding within the soft tissue mass the laminotomy area was controlled using bipolar electrocautery. Irrigation was carried out using copious amounts of irrigant solution. All Gelfoam were then removed. No significant active bleeding present at the time of removal. All instruments sponge counts were correct traction system was then carefully removed. Lumbodorsal fascia was then carefully approximated with interrupted #1 Vicryl sutures with a one inch needle, the deep subcutaneous layers were approximated with interrupted #1 Vicryl sutures on 1 inch needle  the more subcutaneous layers approximated with interrupted 0 Vicryl sutures and the skin closed with stainless steel staples.A Preveena Incision VAC was applied and then reinforced with hypofix tape. Patient was then carefully returned to supine position on a stretcher, reactivated and extubated. She was then returned to recovery room in satisfactory condition.  Benjiman Core, PA-C perform the duties of assistant surgeon during this case. He was present from the beginning of the case to the end of the case assisting in transfer the patient from his stretcher to the OR table and back to the stretcher at the end of the case. Assisted in careful retraction and suction of the laminectomy site delicate neural structures operating under the operating room microscope. He also assisted with closure of the incision from the fascia to the skin applying the dressing.  FINDINGS: Severe right L5 neuroforamenal stenosis due to overlying L5-S1 facet hypertrophy and hypertrophic right sided ligmentum flavum.        Basil Dess  06/23/2019, 11:31 AM

## 2019-06-24 ENCOUNTER — Encounter (HOSPITAL_COMMUNITY): Payer: Self-pay | Admitting: Specialist

## 2019-06-24 DIAGNOSIS — M4726 Other spondylosis with radiculopathy, lumbar region: Secondary | ICD-10-CM | POA: Diagnosis not present

## 2019-06-24 LAB — BASIC METABOLIC PANEL
Anion gap: 12 (ref 5–15)
BUN: 17 mg/dL (ref 6–20)
CO2: 28 mmol/L (ref 22–32)
Calcium: 8.8 mg/dL — ABNORMAL LOW (ref 8.9–10.3)
Chloride: 95 mmol/L — ABNORMAL LOW (ref 98–111)
Creatinine, Ser: 1.36 mg/dL — ABNORMAL HIGH (ref 0.44–1.00)
GFR calc Af Amer: 52 mL/min — ABNORMAL LOW (ref >60)
GFR calc non Af Amer: 45 mL/min — ABNORMAL LOW (ref >60)
Glucose, Bld: 137 mg/dL — ABNORMAL HIGH (ref 70–99)
Potassium: 3.6 mmol/L (ref 3.5–5.1)
Sodium: 135 mmol/L (ref 135–145)

## 2019-06-24 LAB — CBC
HCT: 34.5 % — ABNORMAL LOW (ref 36.0–46.0)
Hemoglobin: 10.9 g/dL — ABNORMAL LOW (ref 12.0–15.0)
MCH: 28.9 pg (ref 26.0–34.0)
MCHC: 31.6 g/dL (ref 30.0–36.0)
MCV: 91.5 fL (ref 80.0–100.0)
Platelets: 265 10*3/uL (ref 150–400)
RBC: 3.77 MIL/uL — ABNORMAL LOW (ref 3.87–5.11)
RDW: 13.1 % (ref 11.5–15.5)
WBC: 10.2 10*3/uL (ref 4.0–10.5)
nRBC: 0 % (ref 0.0–0.2)

## 2019-06-24 MED ORDER — XTAMPZA ER 13.5 MG PO C12A: 1.0000 | EXTENDED_RELEASE_CAPSULE | Freq: Two times a day (BID) | ORAL | 0 refills | Status: DC

## 2019-06-24 MED ORDER — OXYCODONE HCL 5 MG PO TABS: 5.0000 mg | ORAL_TABLET | ORAL | 0 refills | Status: DC | PRN

## 2019-06-24 MED ORDER — DOCUSATE SODIUM 100 MG PO CAPS: 100.0000 mg | ORAL_CAPSULE | Freq: Two times a day (BID) | ORAL | 0 refills | Status: DC

## 2019-06-24 MED FILL — Thrombin (Recombinant) For Soln 20000 Unit: CUTANEOUS | Qty: 1 | Status: AC

## 2019-06-24 NOTE — Evaluation (Signed)
Physical Therapy Evaluation Patient Details Name: Marilyn [name] MRN: [id number] DOB: 07/29/1971 Today's Date: 06/24/2019   History of Present Illness  Pt is a 48 y/o female s/p R L5-S1 parital hemilaminectomies on 06/23/19. PMH including but not limited to asthma, anxiety HTN, obesity and multiple orthopedic surgeries.    Clinical Impression  Pt presented supine in bed with HOB elevated, awake and willing to participate in therapy session. Prior to admission, pt reported that she was independent with all functional mobility and ADLs. Pt lives with her parents in a single level home with a ramped entrance. At the time of evaluation, pt moving relatively well. She required min A for bed mobility, min guard for transfers and min guard progressing to supervision level for hallway ambulation with use of RW. PT provided pt education re: back precautions, car transfers and a generalized walking program for pt to initiate upon d/c home. Frequent cueing required throughout for pt to adhere to back precautions. Pt would continue to benefit from skilled physical therapy services at this time while admitted and after d/c to address the below listed limitations in order to improve overall safety and independence with functional mobility.     Follow Up Recommendations Home health PT;Supervision - Intermittent    Equipment Recommendations  None recommended by PT    Recommendations for Other Services       Precautions / Restrictions Precautions Precautions: Back;Fall Precaution Booklet Issued: Yes (comment) Precaution Comments: reviewed 3/3 back precautions with pt throughout; pt required frequent cueing to adhere to precautions Required Braces or Orthoses: ("no brace needed" MD orders) Restrictions Weight Bearing Restrictions: No      Mobility  Bed Mobility Overal bed mobility: Needs Assistance Bed Mobility: Rolling;Sidelying to Sit Rolling: Min guard Sidelying to sit: Min assist        General bed mobility comments: increased time and effort, cueing for log roll technique, min A for trunk elevation to achieve upright sitting  Transfers Overall transfer level: Needs assistance Equipment used: Rolling walker (2 wheeled) Transfers: Sit to/from Stand Sit to Stand: Min guard         General transfer comment: cues for hand placement, min guard for safety  Ambulation/Gait Ambulation/Gait assistance: Min guard;Supervision Gait Distance (Feet): 150 Feet Assistive device: Rolling walker (2 wheeled) Gait Pattern/deviations: Step-through pattern;Decreased stride length Gait velocity: decreased   General Gait Details: pt generally steady with use of RW; no overt LOB or need for physical assistance  Stairs            Wheelchair Mobility    Modified Rankin (Stroke Patients Only)       Balance Overall balance assessment: Needs assistance Sitting-balance support: Feet supported Sitting balance-Leahy Scale: Good     Standing balance support: During functional activity Standing balance-Leahy Scale: Fair Standing balance comment: can release walker in static standing                             Pertinent Vitals/Pain Pain Assessment: Faces Faces Pain Scale: Hurts little more Pain Location: back Pain Descriptors / Indicators: Operative site guarding;Burning Pain Intervention(s): Monitored during session;Repositioned    Home Living Family/patient expects to be discharged to:: Private residence Living Arrangements: Parent Available Help at Discharge: Family;Available 24 hours/day Type of Home: House Home Access: Ramped entrance     Home Layout: One level Home Equipment: Cane - single point;Bedside commode;Shower seat;Walker - 2 wheels;Crutches;Grab bars - tub/shower  Prior Function Level of Independence: Independent         Comments: Hydrographic surveyor, drives     Hand Dominance   Dominant Hand: Right    Extremity/Trunk  Assessment   Upper Extremity Assessment Upper Extremity Assessment: Defer to OT evaluation RUE Deficits / Details: hx of shoulder sx LUE Deficits / Details: hx of shoulder sx    Lower Extremity Assessment Lower Extremity Assessment: Overall WFL for tasks assessed    Cervical / Trunk Assessment Cervical / Trunk Assessment: Other exceptions Cervical / Trunk Exceptions: s/p lumbar sx; body habitus  Communication   Communication: No difficulties  Cognition Arousal/Alertness: Awake/alert Behavior During Therapy: WFL for tasks assessed/performed Overall Cognitive Status: Within Functional Limits for tasks assessed                                        General Comments      Exercises     Assessment/Plan    PT Assessment Patient needs continued PT services  PT Problem List Decreased balance;Decreased mobility;Decreased coordination;Decreased knowledge of use of DME;Decreased safety awareness;Decreased knowledge of precautions;Pain       PT Treatment Interventions DME instruction;Gait training;Stair training;Functional mobility training;Therapeutic activities;Therapeutic exercise;Balance training;Neuromuscular re-education;Patient/family education    PT Goals (Current goals can be found in the Care Plan section)  Acute Rehab PT Goals Patient Stated Goal: "go home today" PT Goal Formulation: With patient Time For Goal Achievement: 07/08/19 Potential to Achieve Goals: Good    Frequency Min 5X/week   Barriers to discharge        Co-evaluation PT/OT/SLP Co-Evaluation/Treatment: Yes Reason for Co-Treatment: For patient/therapist safety;To address functional/ADL transfers PT goals addressed during session: Mobility/safety with mobility;Balance;Proper use of DME;Strengthening/ROM         AM-PAC PT "6 Clicks" Mobility  Outcome Measure Help needed turning from your back to your side while in a flat bed without using bedrails?: A Little Help needed moving  from lying on your back to sitting on the side of a flat bed without using bedrails?: A Little Help needed moving to and from a bed to a chair (including a wheelchair)?: A Little Help needed standing up from a chair using your arms (e.g., wheelchair or bedside chair)?: None Help needed to walk in hospital room?: None Help needed climbing 3-5 steps with a railing? : A Lot 6 Click Score: 19    End of Session Equipment Utilized During Treatment: Gait belt Activity Tolerance: Patient tolerated treatment well Patient left: in chair;with call bell/phone within reach;with nursing/sitter in room Nurse Communication: Mobility status PT Visit Diagnosis: Other abnormalities of gait and mobility (R26.89);Pain Pain - part of body: (back)    Time: 5732-2025 PT Time Calculation (min) (ACUTE ONLY): 35 min   Charges:   PT Evaluation $PT Eval Moderate Complexity: 1 Mod          Sherie Don, PT, DPT  Acute Rehabilitation Services Pager [phone] Office [place] 06/24/2019, 11:40 AM

## 2019-06-24 NOTE — TOC Transition Note (Addendum)
Transition of Care [facility]) - CM/SW Discharge Note   Patient Details  Name: Marilyn [name] MRN: [id number] Date of Birth: [birth year]  [facility]) CM/SW Contact:  Midge Minium RN, BSN, NCM-BC, ACM-RN [phone] Phone Number: 06/24/2019, 12:19 PM   Clinical Narrative:    Patient is medically stable to transition home. CM spoke with patient to discuss the POC. Patient states she lived at home with family and was independent PTA. PCP/Demo verified. Patient is s/p R L5-S1 parital hemilaminectomies on 06/23/19 with HHPT recommended, with patient agreeable. Patient verbalized having a RW and BSC. CMS HH Compare list provided with [facility] selected. Alvis Lemmings unable to accept the referral d/u staffing; [facility] also unable to accept the referral [facility] referral given to Joen Laura RN [facility] liaison); AVS updated. Patients family will provide transportation home. No further needs from CM.    Final next level of care: [place] Barriers to Discharge: No Barriers Identified   Patient Goals and CMS Choice Patient states their goals for this hospitalization and ongoing recovery are:: "to go home today" CMS Medicare.gov Compare Post Acute Care list provided to:: Patient Choice offered to / list presented to : Patient   Discharge Plan and Services                DME Arranged: N/A DME Agency: NA       HH Arranged: PT HH Agency: [place] Date [place]: 06/24/19 Time [place]: 1206 Representative spoke with at [place]: Adela Lank RN (liaison)  Social Determinants of Health ([place]) Interventions     Readmission Risk Interventions Readmission Risk Prevention Plan 05/30/2018  Transportation Screening Complete  PCP or Specialist Appt within 5-7 Days Complete  Home Care Screening Complete  Medication Review (RN CM) Complete  Some recent data might be hidden

## 2019-06-24 NOTE — Evaluation (Signed)
Occupational Therapy Evaluation and Discharge Patient Details Name: Marilyn [name] MRN: [id number] DOB: 01/25/1971 Today's Date: 06/24/2019    History of Present Illness Marilyn [name] is a 48 y.o. female with medical history significant of iron deficiency anemia on ferrous sulfate maintenance, history of anginal pain, anxiety, osteoarthritis, asthma, dizziness, GERD, history of migraine headaches, hypertension, hypothyroidism, morbid obesity and multiple orthopedic surgeries admitted for L5-S1 hemilaminectomy and forminotomy.   Clinical Impression   Pt was functioning independently prior to admission. Educated in back precautions related to mobility and ADL and reinforced with handout. Pt issued AE as she had lost her kit from prior admission. Pt is knowledgeable in use of AE. No further OT needs. Pt is currently functioning at a min guard to supervision level and will have her mother available at all times as needed.     Follow Up Recommendations  No OT follow up    Equipment Recommendations  None recommended by OT    Recommendations for Other Services       Precautions / Restrictions Precautions Precautions: Back;Fall Precaution Booklet Issued: Yes (comment) Precaution Comments: reviewed back precautions      Mobility Bed Mobility Overal bed mobility: Needs Assistance Bed Mobility: Rolling;Sidelying to Sit Rolling: Min guard Sidelying to sit: Min guard       General bed mobility comments: increased time, verbal cues for log roll technique, min guard for safety, assist of pad to move pt toward L side of bed to offer more space for rolling  Transfers Overall transfer level: Needs assistance Equipment used: Rolling walker (2 wheeled) Transfers: Sit to/from Stand Sit to Stand: Min guard         General transfer comment: cues for hand placement    Balance Overall balance assessment: Needs assistance   Sitting balance-Leahy Scale: Good       Standing  balance-Leahy Scale: Fair Standing balance comment: can release walker in static standing                           ADL either performed or assessed with clinical judgement   ADL Overall ADL's : Needs assistance/impaired Eating/Feeding: Independent   Grooming: Wash/dry hands;Standing;Supervision/safety Grooming Details (indicate cue type and reason): educated in 2 cup method for toothbrushing and washing face with washcloth Upper Body Bathing: Set up;Sitting;With adaptive equipment Upper Body Bathing Details (indicate cue type and reason): issued her a long handled bath sponge Lower Body Bathing: Min guard;Sit to/from stand;With adaptive equipment Lower Body Bathing Details (indicate cue type and reason): issued long handled bath sponge for feet Upper Body Dressing : Set up;Sitting   Lower Body Dressing: Min guard;Sit to/from stand;With adaptive equipment Lower Body Dressing Details (indicate cue type and reason): pt is familiar with use of reacher and sock aid Toilet Transfer: Supervision/safety;Ambulation;RW   Toileting- Clothing Manipulation and Hygiene: Supervision/safety;Sit to/from stand Toileting - Clothing Manipulation Details (indicate cue type and reason): educated in use of tongs for pericare     Functional mobility during ADLs: Supervision/safety;Rolling walker       Vision Baseline Vision/History: Wears glasses Wears Glasses: At all times       Perception     Praxis      Pertinent Vitals/Pain Pain Assessment: Faces Faces Pain Scale: Hurts little more Pain Location: back Pain Descriptors / Indicators: Operative site guarding;Burning Pain Intervention(s): Monitored during session;Repositioned     Hand Dominance Right   Extremity/Trunk Assessment Upper Extremity Assessment Upper Extremity Assessment: RUE  deficits/detail;LUE deficits/detail RUE Deficits / Details: hx of shoulder sx LUE Deficits / Details: hx of shoulder sx   Lower Extremity  Assessment Lower Extremity Assessment: Defer to PT evaluation   Cervical / Trunk Assessment Cervical / Trunk Assessment: Other exceptions Cervical / Trunk Exceptions: s/p sx, obesity, pt has a hx of 2 prior back sxs   Communication Communication Communication: No difficulties   Cognition Arousal/Alertness: Awake/alert Behavior During Therapy: WFL for tasks assessed/performed Overall Cognitive Status: Within Functional Limits for tasks assessed                                     General Comments       Exercises     Shoulder Instructions      Home Living Family/patient expects to be discharged to:: Private residence Living Arrangements: Parent Available Help at Discharge: Family;Available 24 hours/day Type of Home: House Home Access: Ramped entrance     Home Layout: One level     Bathroom Shower/Tub: Tub/shower unit;Curtain   Biochemist, clinical: Standard     Home Equipment: Cane - single point;Bedside commode;Shower seat;Walker - 2 wheels;Crutches;Grab bars - tub/shower          Prior Functioning/Environment Level of Independence: Independent        Comments: Hydrographic surveyor, drives        OT Problem List:        OT Treatment/Interventions:      OT Goals(Current goals can be found in the care plan section) Acute Rehab OT Goals Patient Stated Goal: return home  OT Frequency:     Barriers to D/C:            Co-evaluation              AM-PAC OT "6 Clicks" Daily Activity     Outcome Measure Help from another person eating meals?: None Help from another person taking care of personal grooming?: A Little Help from another person toileting, which includes using toliet, bedpan, or urinal?: A Little Help from another person bathing (including washing, rinsing, drying)?: A Little Help from another person to put on and taking off regular upper body clothing?: None Help from another person to put on and taking off regular lower body  clothing?: A Little 6 Click Score: 20   End of Session Equipment Utilized During Treatment: Gait belt;Rolling walker Nurse Communication: Mobility status  Activity Tolerance: Patient tolerated treatment well Patient left: in chair;with call bell/phone within reach;with nursing/sitter in room  OT Visit Diagnosis: Other abnormalities of gait and mobility (R26.89);Pain                Time: 0930-1003 OT Time Calculation (min): 33 min Charges:  OT General Charges $OT Visit: 1 Visit OT Evaluation $OT Eval Moderate Complexity: 1 Mod  Nestor Lewandowsky, OTR/L Acute Rehabilitation Services Pager: [phone] Office: [phone]  Malka So 06/24/2019, 10:15 AM

## 2019-06-24 NOTE — Progress Notes (Signed)
     Subjective: 1 Day Post-Op Procedure(s) (LRB): RIGHT LUMBAR FIVE THROUGH SACRAL ONE PARTIAL HEMILAMINECTOMY WITH RIGHT LUMBAR FIVE FORAMINOTOMY (N/A) Awake, alert and oriented x 4. Voiding without difficulty, VAC, preveena to cannister with good suction. Legs feel better no right leg pain.   Patient reports pain as moderate.    Objective:   VITALS:  Temp:  [96.8 F (36 C)-98.4 F (36.9 C)] 98 F (36.7 C) (10/07 0407) Pulse Rate:  [73-86] 78 (10/07 0407) Resp:  [12-23] 14 (10/07 0407) BP: (128-178)/(71-106) 134/94 (10/07 0407) SpO2:  [90 %-100 %] 92 % (10/07 0407)  Neurologically intact ABD soft Neurovascular intact Sensation intact distally Intact pulses distally Dorsiflexion/Plantar flexion intact Incision: scant drainage   LABS Recent Labs    06/24/19 0152  HGB 10.9*  WBC 10.2  PLT 265   Recent Labs    06/24/19 0152  NA 135  K 3.6  CL 95*  CO2 28  BUN 17  CREATININE 1.36*  GLUCOSE 137*   No results for input(s): LABPT, INR in the last 72 hours.   Assessment/Plan: 1 Day Post-Op Procedure(s) (LRB): RIGHT LUMBAR FIVE THROUGH SACRAL ONE PARTIAL HEMILAMINECTOMY WITH RIGHT LUMBAR FIVE FORAMINOTOMY (N/A)  Advance diet Up with therapy D/C IV fluids Discharge home with home health  Basil Dess 06/24/2019, 8:37 AMPatient ID: Marilyn [name], female   DOB: 07-15-1971, 48 y.o.   MRN: [id number]

## 2019-06-26 ENCOUNTER — Telehealth: Payer: Self-pay | Admitting: Specialist

## 2019-06-26 NOTE — Telephone Encounter (Signed)
Holding for Christy. 

## 2019-06-26 NOTE — Telephone Encounter (Signed)
Marilyn [name] from [facility], is requesting 1x wk 1wk and 2x wk 6wks. Her call back number is [phone]

## 2019-06-29 ENCOUNTER — Telehealth: Payer: Self-pay

## 2019-06-29 NOTE — Telephone Encounter (Signed)
I spoke with Marilyn [name] and he states that this would ok, I called and advised patient of this.

## 2019-06-29 NOTE — Telephone Encounter (Signed)
Patient called stating that her wound vac is scheduled to go off on Tuesday, 06/30/2019 and her postop appointment is on Wednesday, 07/01/2019.  Wanted to know is it okay to keep wound vac until her appointment?  Cb# is [phone].  Please advise.  Thank you.

## 2019-06-29 NOTE — Telephone Encounter (Signed)
I called and gave verbal auth for orders. 

## 2019-07-01 ENCOUNTER — Telehealth: Payer: Self-pay

## 2019-07-01 ENCOUNTER — Ambulatory Visit (INDEPENDENT_AMBULATORY_CARE_PROVIDER_SITE_OTHER): Payer: Medicare Other | Admitting: Surgery

## 2019-07-01 ENCOUNTER — Other Ambulatory Visit: Payer: Self-pay

## 2019-07-01 DIAGNOSIS — M5417 Radiculopathy, lumbosacral region: Secondary | ICD-10-CM

## 2019-07-01 MED ORDER — OXYCODONE HCL 5 MG PO TABS: 5.0000 mg | ORAL_TABLET | ORAL | 0 refills | Status: AC | PRN

## 2019-07-01 MED ORDER — XTAMPZA ER 13.5 MG PO C12A: 1.0000 | EXTENDED_RELEASE_CAPSULE | Freq: Two times a day (BID) | ORAL | 0 refills | Status: DC

## 2019-07-01 NOTE — Telephone Encounter (Signed)
Sonia Side called back from home health , he said that he reached to pt insurance  Check her benefits, they have set up appointment for patient to  start Home health/ PT tomorrow 07-02-19, Sonia Side said that he will contact the patient with this information.

## 2019-07-01 NOTE — Progress Notes (Signed)
Office Visit Note   Patient: Marilyn [name]           Date of Birth: November 06, 1970           MRN: [id number] Visit Date: 07/01/2019              Requested by: Rebecka Apley, NP [address] [place],  [place] [address] PCP: Rebecka Apley, NP   Assessment & Plan: Visit Diagnoses:  1. Lumbosacral radiculopathy     Plan: Patient states that she does not have home assistance.  .  We will have home health agency go out for gait training and wound checks until her return office visit next week with Dr. Otelia Sergeant.  Patient will do daily dressing changes.  I advised her not to put any creams or ointments on her incision.  Hold off on showering and continue to sponge bath.  Wound VAC removed today.  Follow-Up Instructions: No follow-ups on file.   Orders:  No orders of the defined types were placed in this encounter.  Meds ordered this encounter  Medications  . oxyCODONE (OXY IR/ROXICODONE) 5 MG immediate release tablet    Sig: Take 1 tablet (5 mg total) by mouth every 4 (four) hours as needed for up to 7 days for moderate pain ((score 4 to 6)).    Dispense:  50 tablet    Refill:  0  . oxyCODONE ER (XTAMPZA ER) 13.5 MG C12A    Sig: Take 1 tablet by mouth every 12 (twelve) hours for 7 days.    Dispense:  10 capsule    Refill:  0      Procedures: No procedures performed   Clinical Data: No additional findings.   Subjective: Chief Complaint  Patient presents with  . Lower Back - Routine Post Op    HPI  Review of Systems   Objective: Vital Signs: LMP  (LMP Unknown) Comment: tubal ligation  Physical Exam  Ortho Exam  Specialty Comments:  No specialty comments available.  Imaging: No results found.   PMFS History: Patient Active Problem List   Diagnosis Date Noted  . Status post lumbar laminectomy 06/23/2019  . C. difficile colitis 05/22/2019  . Hypophosphatemia 05/21/2019  . Syncope 05/20/2019  . OSA on CPAP   . Acute  gastroenteritis   . Anemia due to blood loss, acute 06/18/2018    Class: Acute  . Plantar fasciitis of left foot 06/16/2018    Class: Chronic  . Tendonitis, Achilles, right 06/16/2018    Class: Chronic  . Osteochondral talar dome lesion 06/16/2018    Class: Chronic  . Deep incisional surgical site infection   . Superficial incisional surgical site infection 06/11/2018    Class: Acute  . Right ankle effusion 06/11/2018    Class: Acute  . Morbid (severe) obesity due to excess calories (HCC) 05/29/2018    Class: Chronic  . Spondylolisthesis, lumbar region 05/27/2018    Class: Chronic  . Spinal stenosis of lumbar region 05/27/2018    Class: Chronic  . Urinary tract infection 05/27/2018    Class: Acute  . Fusion of spine of lumbar region 05/27/2018  . Pain of right hip joint 07/03/2017  . Acute right-sided low back pain with right-sided sciatica 03/27/2017  . Chronic right shoulder pain 02/13/2017  . History of rotator cuff tear 11/30/2016  . Impingement syndrome of right shoulder 11/30/2016  . Exacerbation of asthma 07/18/2016  . Hypokalemia 07/18/2016  . Hypertension 07/18/2016  . Depression  with anxiety 07/18/2016  . Hypothyroidism 07/18/2016  . Hyperglycemia 07/18/2016  . Acute asthma exacerbation 07/18/2016  . Surgical wound dehiscence left hip; questionable superficial infection 11/10/2015  . Left hip postoperative wound infection 11/10/2015  . Osteoarthritis of left hip 09/09/2015  . 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 Obesity (BMI 35.0-39.9 without comorbidity) 04/28/2013   Past Medical History:  Diagnosis Date  . Anemia    taking iron now. pt states having no current issues 09/02/2015  . Anginal pain ([place])    pt states experiences chest wall pain pt states related to her asthma   . Anxiety    with MRI's  . Arthritis    Everywhere  . Asthma   . Depression   . Dizziness   . GERD (gastroesophageal reflux disease)   . Headache(784.0)    HX  OF MIGRAINES  . History of bronchitis   . Hypertension   . Hypothyroidism    takes levothyroxen  . OSA on CPAP    wears cpap  . Shortness of breath    with exertion  . Wears glasses     Family History  Problem Relation Age of Onset  . Cancer Mother        colon  . Epilepsy Mother   . Cancer Father        prostate  . Diabetes Father   . Hypertension Father   . Hypertension Maternal Aunt   . Diabetes Maternal Aunt   . Hypertension Paternal Aunt     Past Surgical History:  Procedure Laterality Date  . BACK SURGERY    . CESAREAN SECTION     times 2  . CHOLECYSTECTOMY    . ENDOMETRIAL ABLATION    . INCISION AND DRAINAGE HIP Left 11/10/2015   Procedure: IRRIGATION AND DEBRIDEMENT LEFT HIP INCISION;  Surgeon: Mcarthur Rossetti, MD;  Location: [facility];  Service: Orthopedics;  Laterality: Left;  . JOINT REPLACEMENT  2011   total left knee  . KNEE ARTHROPLASTY  04/23/2012   right   . KNEE ARTHROSCOPY    . LUMBAR LAMINECTOMY/DECOMPRESSION MICRODISCECTOMY N/A 06/23/2019   Procedure: RIGHT LUMBAR FIVE THROUGH SACRAL ONE PARTIAL HEMILAMINECTOMY WITH RIGHT LUMBAR FIVE FORAMINOTOMY;  Surgeon: Jessy Oto, MD;  Location: [facility];  Service: Orthopedics;  Laterality: N/A;  . LUMBAR WOUND DEBRIDEMENT N/A 06/11/2018   Procedure: LUMBAR WOUND DEBRIDEMENT;  Surgeon: Jessy Oto, MD;  Location: [facility];  Service: Orthopedics;  Laterality: N/A;  . RADIOLOGY WITH ANESTHESIA N/A 09/09/2018   Procedure: LUMBER SPINE WITHOUT CONTRAST;  Surgeon: Radiologist, Medication, MD;  Location: [facility];  Service: Radiology;  Laterality: N/A;  . ROTATOR CUFF REPAIR     left   . SHOULDER SURGERY     right to repair ligament tear  . TOTAL HIP ARTHROPLASTY Left 09/09/2015   Procedure: LEFT TOTAL HIP ARTHROPLASTY ANTERIOR APPROACH;  Surgeon: Mcarthur Rossetti, MD;  Location: [facility];  Service: Orthopedics;  Laterality: Left;  . TOTAL KNEE ARTHROPLASTY  04/23/2012   Procedure: TOTAL KNEE ARTHROPLASTY;   Surgeon: Newt Minion, MD;  Location: [facility];  Service: Orthopedics;  Laterality: Right;  Right Total Knee Arthroplasty  . TUBAL LIGATION  1996   Social History   Occupational History  . Not on file  Tobacco Use  . Smoking status: Former Smoker    Packs/day: 0.50    Years: 4.00    Pack years: 2.00    Quit date: 09/18/1991    Years  since quitting: 27.8  . Smokeless tobacco: Never Used  Substance and Sexual Activity  . Alcohol use: No  . Drug use: No  . Sexual activity: Yes    Birth control/protection: Surgical

## 2019-07-01 NOTE — Telephone Encounter (Signed)
Called l/m for Marilyn [name] with [facility], pt stated that she has home evaluation , and no one reached back out to her about weather or not if she qualified for home health. L/m on voicemail for him to call me back at the office.

## 2019-07-02 NOTE — Discharge Summary (Signed)
Patient ID: Marilyn [name] MRN: [id number] DOB/AGE: Jun 30, 1971 48 y.o.  Admit date: 06/23/2019 Discharge date: 07/02/2019  Admission Diagnoses:  Active Problems:   Status post lumbar laminectomy   Discharge Diagnoses:  Active Problems:   Status post lumbar laminectomy  status post Procedure(s): RIGHT LUMBAR FIVE THROUGH SACRAL ONE PARTIAL HEMILAMINECTOMY WITH RIGHT LUMBAR FIVE FORAMINOTOMY  Past Medical History:  Diagnosis Date  . Anemia    taking iron now. pt states having no current issues 09/02/2015  . Anginal pain ([place])    pt states experiences chest wall pain pt states related to her asthma   . Anxiety    with MRI's  . Arthritis    Everywhere  . Asthma   . Depression   . Dizziness   . GERD (gastroesophageal reflux disease)   . Headache(784.0)    HX OF MIGRAINES  . History of bronchitis   . Hypertension   . Hypothyroidism    takes levothyroxen  . OSA on CPAP    wears cpap  . Shortness of breath    with exertion  . Wears glasses     Surgeries: Procedure(s): RIGHT LUMBAR FIVE THROUGH SACRAL ONE PARTIAL HEMILAMINECTOMY WITH RIGHT LUMBAR FIVE FORAMINOTOMY on 06/23/2019   Consultants:   Discharged Condition: Improved  Hospital Course: [name] is an 48 y.o. female who was admitted 06/23/2019 for operative treatment of lumbar HNP/stenosis. Patient failed conservative treatments (please see the history and physical for the specifics) and had severe unremitting pain that affects sleep, daily activities and work/hobbies. After pre-op clearance, the patient was taken to the operating room on 06/23/2019 and underwent  Procedure(s): RIGHT LUMBAR FIVE THROUGH SACRAL ONE PARTIAL HEMILAMINECTOMY WITH RIGHT LUMBAR FIVE FORAMINOTOMY.    Patient was given perioperative antibiotics:  Anti-infectives (From admission, onward)   Start     Dose/Rate Route Frequency Ordered Stop   06/23/19 2000  ciprofloxacin (CIPRO) tablet 500 mg  Status:  Discontinued     500 mg  Oral 2 times daily 06/23/19 1608 06/23/19 1635   06/23/19 1700  ceFAZolin (ANCEF) IVPB 2g/100 mL premix     2 g 200 mL/hr over 30 Minutes Intravenous Every 8 hours 06/23/19 1608 06/24/19 0148   06/23/19 0630  ceFAZolin (ANCEF) 3 g in dextrose 5 % 50 mL IVPB     3 g 100 mL/hr over 30 Minutes Intravenous To ShortStay Surgical 06/22/19 1258 06/23/19 0905   06/23/19 0530  vancomycin (VANCOCIN) 1,500 mg in sodium chloride 0.9 % 500 mL IVPB     1,500 mg 250 mL/hr over 120 Minutes Intravenous To Surgery 06/22/19 1258 06/23/19 0857       Patient was given sequential compression devices and early ambulation to prevent DVT.   Patient benefited maximally from hospital stay and there were no complications. At the time of discharge, the patient was urinating/moving their bowels without difficulty, tolerating a regular diet, pain is controlled with oral pain medications and they have been cleared by PT/OT.   Recent vital signs: No data found.   Recent laboratory studies: No results for input(s): WBC, HGB, HCT, PLT, NA, K, CL, CO2, BUN, CREATININE, GLUCOSE, INR, CALCIUM in the last 72 hours.  Invalid input(s): PT, 2   Discharge Medications:   Allergies as of 06/24/2019      Reactions   Lisinopril Anaphylaxis   Angioedema   Bee Venom Swelling, Other (See Comments)   Swelling at the site   Propoxyphene Hives   Codeine Nausea Only   Latex  Rash   Meloxicam Other (See Comments)   Insomnia, constipation   Morphine And Related Rash   Tomato Rash      Medication List    STOP taking these medications   HYDROcodone-acetaminophen 10-325 MG tablet Commonly known as: NORCO     TAKE these medications   albuterol (2.5 MG/3ML) 0.083% nebulizer solution Commonly known as: PROVENTIL Take 3 mLs (2.5 mg total) by nebulization 3 (three) times daily. What changed:   when to take this  reasons to take this   albuterol 108 (90 Base) MCG/ACT inhaler Commonly known as: VENTOLIN HFA Inhale 2 puffs  into the lungs every 6 (six) hours as needed for wheezing or shortness of breath. What changed: Another medication with the same name was changed. Make sure you understand how and when to take each.   allopurinol 100 MG tablet Commonly known as: ZYLOPRIM Take 1 tablet (100 mg total) by mouth 2 (two) times daily.   cetirizine 10 MG tablet Commonly known as: ZYRTEC Take 10 mg by mouth daily.   ciprofloxacin 500 MG tablet Commonly known as: CIPRO Take 1 tablet (500 mg total) by mouth 2 (two) times daily.   cyclobenzaprine 10 MG tablet Commonly known as: FLEXERIL Take 10 mg by mouth 3 (three) times daily as needed for muscle spasms.   diclofenac sodium 1 % Gel Commonly known as: VOLTAREN Apply 4 g topically 4 (four) times daily. What changed:   when to take this  reasons to take this   docusate sodium 100 MG capsule Commonly known as: COLACE Take 1 capsule (100 mg total) by mouth 2 (two) times daily.   DULoxetine 60 MG capsule Commonly known as: CYMBALTA Take 60 mg by mouth daily.   ferrous gluconate 324 MG tablet Commonly known as: FERGON Take 1 tablet (324 mg total) by mouth 2 (two) times daily with a meal. What changed: when to take this   gabapentin 300 MG capsule Commonly known as: NEURONTIN Take two capsules at night qhs and one in the am and one with noon meal What changed:   how much to take  how to take this  when to take this  additional instructions   hydrochlorothiazide 25 MG tablet Commonly known as: HYDRODIURIL Take 25 mg by mouth daily.   Lasix 20 MG tablet Generic drug: furosemide Take 20 mg by mouth daily as needed for fluid.   levothyroxine 125 MCG tablet Commonly known as: SYNTHROID Take 125 mcg by mouth daily before breakfast.   Narcan 4 MG/0.1ML Liqd nasal spray kit Generic drug: naloxone Place 0.4 mg into the nose as needed (opiate overdose).   ondansetron 4 MG disintegrating tablet Commonly known as: Zofran ODT Take 1 tablet  (4 mg total) by mouth every 8 (eight) hours as needed for nausea or vomiting.   oxybutynin 15 MG 24 hr tablet Commonly known as: DITROPAN XL Take 15 mg by mouth daily.   phosphorus 155-852-130 MG tablet Commonly known as: K PHOS NEUTRAL Take 2 tablets (500 mg total) by mouth 4 (four) times daily.   potassium chloride 20 MEQ/15ML (10%) Soln Take 20 mEq by mouth 2 (two) times daily.   vancomycin 50 mg/mL  oral solution Commonly known as: VANCOCIN Take 2.5 mLs (125 mg total) by mouth 4 (four) times daily.   vitamin B-12 250 MCG tablet Commonly known as: CYANOCOBALAMIN Take 250 mcg by mouth daily.   Vitamin D 50 MCG (2000 UT) Caps Take 2,000 Units by mouth daily.   zolpidem  10 MG tablet Commonly known as: AMBIEN Take 10 mg by mouth at bedtime.       Diagnostic Studies: Dg Lumbar Spine 2-3 Views  Result Date: 06/23/2019 CLINICAL DATA:  Right L5-S1 partial hemilaminectomy. EXAM: LUMBAR SPINE - 2-3 VIEW COMPARISON:  Radiographs of April 01, 2019. MRI of September 09, 2018. FINDINGS: Single intraoperative cross-table lateral projection was obtained of the lumbar spine. Surgical probe appears to be at approximately the L5-S1 level. IMPRESSION: Surgical localization as described above. Electronically Signed   By: Marijo Conception M.D.   On: 06/23/2019 11:24    Discharge Instructions    Call MD / Call 911   Complete by: As directed    If you experience chest pain or shortness of breath, CALL 911 and be transported to the hospital emergency room.  If you develope a fever above 101 F, pus (white drainage) or increased drainage or redness at the wound, or calf pain, call your surgeon's office.   Constipation Prevention   Complete by: As directed    Drink plenty of fluids.  Prune juice may be helpful.  You may use a stool softener, such as Colace (over the counter) 100 mg twice a day.  Use MiraLax (over the counter) for constipation as needed.   Diet - low sodium heart healthy   Complete by:  As directed    Discharge instructions   Complete by: As directed    No lifting greater than 10 lbs. Avoid bending, stooping and twisting. Walk in house for first week them may start to get out slowly increasing distance up to one mile by 3 weeks post op. Keep incision dry for 7 days, Then return to the office for removal of the dressing and Preveena incision VAC drain.   Driving restrictions   Complete by: As directed    No driving for 3 weeks   Increase activity slowly as tolerated   Complete by: As directed    Lifting restrictions   Complete by: As directed    No lifting for 8 weeks      Follow-up Information    Jessy Oto, MD Follow up in 1 week(s).   Specialty: Orthopedic Surgery Why: For wound re-check Contact information: [place] [address] [phone]        Home, [facility] Follow up.   Specialty: [place] Why: Home Health Physical Therapy Contact information: [address] [place]  [address] [phone]           Discharge Plan:  discharge to home  Disposition:     Signed: Benjiman Core  07/02/2019, 1:07 PM

## 2019-07-03 ENCOUNTER — Telehealth: Payer: Self-pay | Admitting: Specialist

## 2019-07-03 NOTE — Telephone Encounter (Signed)
Marilyn [name] from [facility] called. Patient refused PT today. Says she did too much this morning.

## 2019-07-03 NOTE — Telephone Encounter (Signed)
Great thank you very much for taking care of that.

## 2019-07-03 NOTE — Telephone Encounter (Signed)
FYI

## 2019-07-09 ENCOUNTER — Encounter: Payer: Self-pay | Admitting: Specialist

## 2019-07-09 ENCOUNTER — Ambulatory Visit (INDEPENDENT_AMBULATORY_CARE_PROVIDER_SITE_OTHER): Payer: Medicare Other | Admitting: Specialist

## 2019-07-09 ENCOUNTER — Other Ambulatory Visit: Payer: Self-pay

## 2019-07-09 VITALS — BP 102/60 | HR 70 | Ht 63.0 in | Wt 343.0 lb

## 2019-07-09 DIAGNOSIS — G8929 Other chronic pain: Secondary | ICD-10-CM

## 2019-07-09 DIAGNOSIS — M4326 Fusion of spine, lumbar region: Secondary | ICD-10-CM

## 2019-07-09 DIAGNOSIS — M792 Neuralgia and neuritis, unspecified: Secondary | ICD-10-CM

## 2019-07-09 MED ORDER — XTAMPZA ER 13.5 MG PO C12A: 1.0000 | EXTENDED_RELEASE_CAPSULE | Freq: Two times a day (BID) | ORAL | 0 refills | Status: DC

## 2019-07-09 MED ORDER — OXYCODONE HCL 5 MG PO CAPS: 5.0000 mg | ORAL_CAPSULE | ORAL | 0 refills | Status: DC | PRN

## 2019-07-09 NOTE — Patient Instructions (Signed)
Avoid frequent bending and stooping  No lifting greater than 10 lbs. May use ice or moist heat for pain. Weight loss is of benefit.  Exercise is important to improve your indurance and does allow people to function better inspite of back pain. 

## 2019-07-09 NOTE — Progress Notes (Signed)
Post-Op Visit Note   Patient: Marilyn [name]           Date of Birth: [birth year]           MRN: [id number] Visit Date: 07/09/2019 PCP: Bridget Hartshorn, NP   Assessment & Plan: 18 days post right L5-S1 foramenotomy  For persistent right L5 nerve compression post  L5-S1 fusion.  Chief Complaint:  Chief Complaint  Patient presents with  . Lower Back - Wound Check, Routine Post Op  Reports her pain in the right leg is still present with skin sensitivity.  The incision with staples intact  Right leg N-V normal She has spasm and burning pain in the right leg still. Staples in the incision removed with more pain than expected, screams with pain with the staple removal.   Office Visit Note   Patient: Marilyn [name]           Date of Birth: 1970-12-13           MRN: [id number] Visit Date: 07/09/2019              Requested by: Bridget Hartshorn, NP [place],  [place] [address] PCP: Bridget Hartshorn, NP   Assessment & Plan: Visit Diagnoses: No diagnosis found.  Plan: Avoid frequent bending and stooping  No lifting greater than 10 lbs. May use ice or moist heat for pain. Weight loss is of benefit.  Exercise is important to improve your indurance and does allow people to function better inspite of back pain.    Follow-Up Instructions: Return in about 4 weeks (around 08/06/2019).   Orders:  No orders of the defined types were placed in this encounter.  No orders of the defined types were placed in this encounter.     Procedures: No procedures performed   Clinical Data: No additional findings.   Subjective: Chief Complaint  Patient presents with  . Lower Back - Wound Check, Routine Post Op    HPI  Review of Systems   Objective: Vital Signs: BP 102/60 (BP Location: Left Arm, Patient Position: Sitting)   Pulse 70   Ht 5\' 3"  (1.6 m)   Wt (!) 343 lb (155.6 kg)   LMP  (LMP Unknown) Comment: tubal ligation  BMI 60.76  kg/m   Physical Exam  Ortho Exam  Specialty Comments:  No specialty comments available.  Imaging: No results found.   PMFS History: Patient Active Problem List   Diagnosis Date Noted  . Anemia due to blood loss, acute 06/18/2018    Priority: High    Class: Acute  . Superficial incisional surgical site infection 06/11/2018    Priority: High    Class: Acute  . Right ankle effusion 06/11/2018    Priority: High    Class: Acute  . Morbid (severe) obesity due to excess calories ([place]) 05/29/2018    Priority: High    Class: Chronic  . Spondylolisthesis, lumbar region 05/27/2018    Priority: High    Class: Chronic  . Spinal stenosis of lumbar region 05/27/2018    Priority: High    Class: Chronic  . Urinary tract infection 05/27/2018    Priority: High    Class: Acute  . Plantar fasciitis of left foot 06/16/2018    Priority: Medium    Class: Chronic  . Tendonitis, Achilles, right 06/16/2018    Priority: Medium    Class: Chronic  . Osteochondral talar dome lesion 06/16/2018    Priority: Medium  Class: Chronic  . Status post lumbar laminectomy 06/23/2019  . C. difficile colitis 05/22/2019  . Hypophosphatemia 05/21/2019  . Syncope 05/20/2019  . OSA on CPAP   . Acute gastroenteritis   . Deep incisional surgical site infection   . Fusion of spine of lumbar region 05/27/2018  . Pain of right hip joint 07/03/2017  . Acute right-sided low back pain with right-sided sciatica 03/27/2017  . Chronic right shoulder pain 02/13/2017  . History of rotator cuff tear 11/30/2016  . Impingement syndrome of right shoulder 11/30/2016  . Exacerbation of asthma 07/18/2016  . Hypokalemia 07/18/2016  . Hypertension 07/18/2016  . Depression with anxiety 07/18/2016  . Hypothyroidism 07/18/2016  . Hyperglycemia 07/18/2016  . Acute asthma exacerbation 07/18/2016  . Surgical wound dehiscence left hip; questionable superficial infection 11/10/2015  . Left hip postoperative wound infection  11/10/2015  . Osteoarthritis of left hip 09/09/2015  . 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 Obesity (BMI 35.0-39.9 without comorbidity) 04/28/2013   Past Medical History:  Diagnosis Date  . Anemia    taking iron now. pt states having no current issues 09/02/2015  . Anginal pain ([facility])    pt states experiences chest wall pain pt states related to her asthma   . Anxiety    with MRI's  . Arthritis    Everywhere  . Asthma   . Depression   . Dizziness   . GERD (gastroesophageal reflux disease)   . Headache(784.0)    HX OF MIGRAINES  . History of bronchitis   . Hypertension   . Hypothyroidism    takes levothyroxen  . OSA on CPAP    wears cpap  . Shortness of breath    with exertion  . Wears glasses     Family History  Problem Relation Age of Onset  . Cancer Mother        colon  . Epilepsy Mother   . Cancer Father        prostate  . Diabetes Father   . Hypertension Father   . Hypertension Maternal Aunt   . Diabetes Maternal Aunt   . Hypertension Paternal Aunt     Past Surgical History:  Procedure Laterality Date  . BACK SURGERY    . CESAREAN SECTION     times 2  . CHOLECYSTECTOMY    . ENDOMETRIAL ABLATION    . INCISION AND DRAINAGE HIP Left 11/10/2015   Procedure: IRRIGATION AND DEBRIDEMENT LEFT HIP INCISION;  Surgeon: Kathryne Hitch, MD;  Location: [facility] OR;  Service: Orthopedics;  Laterality: Left;  . JOINT REPLACEMENT  2011   total left knee  . KNEE ARTHROPLASTY  04/23/2012   right   . KNEE ARTHROSCOPY    . LUMBAR LAMINECTOMY/DECOMPRESSION MICRODISCECTOMY N/A 06/23/2019   Procedure: RIGHT LUMBAR FIVE THROUGH SACRAL ONE PARTIAL HEMILAMINECTOMY WITH RIGHT LUMBAR FIVE FORAMINOTOMY;  Surgeon: Kerrin Champagne, MD;  Location: [facility] OR;  Service: Orthopedics;  Laterality: N/A;  . LUMBAR WOUND DEBRIDEMENT N/A 06/11/2018   Procedure: LUMBAR WOUND DEBRIDEMENT;  Surgeon: Kerrin Champagne, MD;  Location: [facility] OR;  Service: Orthopedics;  Laterality: N/A;  .  RADIOLOGY WITH ANESTHESIA N/A 09/09/2018   Procedure: LUMBER SPINE WITHOUT CONTRAST;  Surgeon: Radiologist, Medication, MD;  Location: [facility] OR;  Service: Radiology;  Laterality: N/A;  . ROTATOR CUFF REPAIR     left   . SHOULDER SURGERY     right to repair ligament tear  . TOTAL HIP ARTHROPLASTY Left 09/09/2015   Procedure:  LEFT TOTAL HIP ARTHROPLASTY ANTERIOR APPROACH;  Surgeon: Kathryne Hitchhristopher Y Blackman, MD;  Location: [facility] ORS;  Service: Orthopedics;  Laterality: Left;  . TOTAL KNEE ARTHROPLASTY  04/23/2012   Procedure: TOTAL KNEE ARTHROPLASTY;  Surgeon: Nadara MustardMarcus V Duda, MD;  Location: [facility] OR;  Service: Orthopedics;  Laterality: Right;  Right Total Knee Arthroplasty  . TUBAL LIGATION  1996   Social History   Occupational History  . Not on file  Tobacco Use  . Smoking status: Former Smoker    Packs/day: 0.50    Years: 4.00    Pack years: 2.00    Quit date: 09/18/1991    Years since quitting: 27.8  . Smokeless tobacco: Never Used  Substance and Sexual Activity  . Alcohol use: No  . Drug use: No  . Sexual activity: Yes    Birth control/protection: Surgical       Visit Diagnoses: No diagnosis found.  Plan: Avoid frequent bending and stooping  No lifting greater than 10 lbs. May use ice or moist heat for pain. Weight loss is of benefit. Best medication for lumbar disc disease is arthritis medications like motrin, celebrex and naprosyn. Exercise is important to improve your indurance and does allow people to function better inspite of back pain.    Follow-Up Instructions: Return in about 4 weeks (around 08/06/2019).   Orders:  No orders of the defined types were placed in this encounter.  No orders of the defined types were placed in this encounter.   Imaging: No results found.  PMFS History: Patient Active Problem List   Diagnosis Date Noted  . Anemia due to blood loss, acute 06/18/2018    Priority: High    Class: Acute  . Superficial incisional surgical site infection  06/11/2018    Priority: High    Class: Acute  . Right ankle effusion 06/11/2018    Priority: High    Class: Acute  . Morbid (severe) obesity due to excess calories ([facility]) 05/29/2018    Priority: High    Class: Chronic  . Spondylolisthesis, lumbar region 05/27/2018    Priority: High    Class: Chronic  . Spinal stenosis of lumbar region 05/27/2018    Priority: High    Class: Chronic  . Urinary tract infection 05/27/2018    Priority: High    Class: Acute  . Plantar fasciitis of left foot 06/16/2018    Priority: Medium    Class: Chronic  . Tendonitis, Achilles, right 06/16/2018    Priority: Medium    Class: Chronic  . Osteochondral talar dome lesion 06/16/2018    Priority: Medium    Class: Chronic  . Status post lumbar laminectomy 06/23/2019  . C. difficile colitis 05/22/2019  . Hypophosphatemia 05/21/2019  . Syncope 05/20/2019  . OSA on CPAP   . Acute gastroenteritis   . Deep incisional surgical site infection   . Fusion of spine of lumbar region 05/27/2018  . Pain of right hip joint 07/03/2017  . Acute right-sided low back pain with right-sided sciatica 03/27/2017  . Chronic right shoulder pain 02/13/2017  . History of rotator cuff tear 11/30/2016  . Impingement syndrome of right shoulder 11/30/2016  . Exacerbation of asthma 07/18/2016  . Hypokalemia 07/18/2016  . Hypertension 07/18/2016  . Depression with anxiety 07/18/2016  . Hypothyroidism 07/18/2016  . Hyperglycemia 07/18/2016  . Acute asthma exacerbation 07/18/2016  . Surgical wound dehiscence left hip; questionable superficial infection 11/10/2015  . Left hip postoperative wound infection 11/10/2015  . Osteoarthritis of left hip 09/09/2015  .  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 Obesity (BMI 35.0-39.9 without comorbidity) 04/28/2013   Past Medical History:  Diagnosis Date  . Anemia    taking iron now. pt states having no current issues 09/02/2015  . Anginal pain ([facility])    pt states  experiences chest wall pain pt states related to her asthma   . Anxiety    with MRI's  . Arthritis    Everywhere  . Asthma   . Depression   . Dizziness   . GERD (gastroesophageal reflux disease)   . Headache(784.0)    HX OF MIGRAINES  . History of bronchitis   . Hypertension   . Hypothyroidism    takes levothyroxen  . OSA on CPAP    wears cpap  . Shortness of breath    with exertion  . Wears glasses     Family History  Problem Relation Age of Onset  . Cancer Mother        colon  . Epilepsy Mother   . Cancer Father        prostate  . Diabetes Father   . Hypertension Father   . Hypertension Maternal Aunt   . Diabetes Maternal Aunt   . Hypertension Paternal Aunt     Past Surgical History:  Procedure Laterality Date  . BACK SURGERY    . CESAREAN SECTION     times 2  . CHOLECYSTECTOMY    . ENDOMETRIAL ABLATION    . INCISION AND DRAINAGE HIP Left 11/10/2015   Procedure: IRRIGATION AND DEBRIDEMENT LEFT HIP INCISION;  Surgeon: Kathryne Hitch, MD;  Location: [facility] OR;  Service: Orthopedics;  Laterality: Left;  . JOINT REPLACEMENT  2011   total left knee  . KNEE ARTHROPLASTY  04/23/2012   right   . KNEE ARTHROSCOPY    . LUMBAR LAMINECTOMY/DECOMPRESSION MICRODISCECTOMY N/A 06/23/2019   Procedure: RIGHT LUMBAR FIVE THROUGH SACRAL ONE PARTIAL HEMILAMINECTOMY WITH RIGHT LUMBAR FIVE FORAMINOTOMY;  Surgeon: Kerrin Champagne, MD;  Location: [facility] OR;  Service: Orthopedics;  Laterality: N/A;  . LUMBAR WOUND DEBRIDEMENT N/A 06/11/2018   Procedure: LUMBAR WOUND DEBRIDEMENT;  Surgeon: Kerrin Champagne, MD;  Location: [facility] OR;  Service: Orthopedics;  Laterality: N/A;  . RADIOLOGY WITH ANESTHESIA N/A 09/09/2018   Procedure: LUMBER SPINE WITHOUT CONTRAST;  Surgeon: Radiologist, Medication, MD;  Location: [facility] OR;  Service: Radiology;  Laterality: N/A;  . ROTATOR CUFF REPAIR     left   . SHOULDER SURGERY     right to repair ligament tear  . TOTAL HIP ARTHROPLASTY Left 09/09/2015   Procedure:  LEFT TOTAL HIP ARTHROPLASTY ANTERIOR APPROACH;  Surgeon: Kathryne Hitch, MD;  Location: [facility];  Service: Orthopedics;  Laterality: Left;  . TOTAL KNEE ARTHROPLASTY  04/23/2012   Procedure: TOTAL KNEE ARTHROPLASTY;  Surgeon: [name], MD;  Location: [facility] OR;  Service: Orthopedics;  Laterality: Right;  Right Total Knee Arthroplasty  . TUBAL LIGATION  1996   Social History   Occupational History  . Not on file  Tobacco Use  . Smoking status: Former Smoker    Packs/day: 0.50    Years: 4.00    Pack years: 2.00    Quit date: 09/18/1991    Years since quitting: 27.8  . Smokeless tobacco: Never Used  Substance and Sexual Activity  . Alcohol use: No  . Drug use: No  . Sexual activity: Yes    Birth control/protection: Surgical

## 2019-07-14 DIAGNOSIS — M4726 Other spondylosis with radiculopathy, lumbar region: Secondary | ICD-10-CM

## 2019-07-14 NOTE — Interval H&P Note (Signed)
History and Physical Interval Note:  07/14/2019 11:00 AM  Marilyn [name]  has presented today for surgery, with the diagnosis of Right L5-S1 foraminal stenosis, lumbar spondylosis with right L5 nerve compression.  The various methods of treatment have been discussed with the patient and family. After consideration of risks, benefits and other options for treatment, the patient has consented to  Procedure(s): RIGHT LUMBAR FIVE THROUGH SACRAL ONE PARTIAL HEMILAMINECTOMY WITH RIGHT LUMBAR FIVE FORAMINOTOMY (N/A) as a surgical intervention.  The patient's history has been reviewed, patient examined, no change in status, stable for surgery.  I have reviewed the patient's chart and labs.  Questions were answered to the patient's satisfaction.     Basil Dess

## 2019-07-15 ENCOUNTER — Other Ambulatory Visit: Payer: Self-pay

## 2019-07-15 ENCOUNTER — Ambulatory Visit: Payer: Medicare Other | Attending: Specialist | Admitting: Physical Therapy

## 2019-07-15 ENCOUNTER — Encounter: Payer: Self-pay | Admitting: Physical Therapy

## 2019-07-15 DIAGNOSIS — M5441 Lumbago with sciatica, right side: Secondary | ICD-10-CM | POA: Diagnosis present

## 2019-07-15 DIAGNOSIS — R293 Abnormal posture: Secondary | ICD-10-CM | POA: Insufficient documentation

## 2019-07-15 DIAGNOSIS — M5442 Lumbago with sciatica, left side: Secondary | ICD-10-CM

## 2019-07-15 NOTE — Therapy (Signed)
[facility] [address] [place], [place], [address] Phone: [phone]   Fax:  [phone]  Physical Therapy Evaluation  Patient Details  Name: Marilyn [name] MRN: [id number] Date of Birth: 04-Apr-1971 Referring Provider (PT): Vira Browns, MD   Encounter Date: 07/15/2019  PT End of Session - 07/15/19 2008    Visit Number  1    Number of Visits  12    Date for PT Re-Evaluation  09/02/19    Authorization Type  PROGRESS NOTE AT 10TH VISIT.  KX MODIFIER AFTER 15 VISITS.    PT Start Time  1115    PT Stop Time  1156    PT Time Calculation (min)  41 min    Activity Tolerance  Patient tolerated treatment well    Behavior During Therapy  WFL for tasks assessed/performed       Past Medical History:  Diagnosis Date  . Anemia    taking iron now. pt states having no current issues 09/02/2015  . Anginal pain (HCC)    pt states experiences chest wall pain pt states related to her asthma   . Anxiety    with MRI's  . Arthritis    Everywhere  . Asthma   . Depression   . Dizziness   . GERD (gastroesophageal reflux disease)   . Headache(784.0)    HX OF MIGRAINES  . History of bronchitis   . Hypertension   . Hypothyroidism    takes levothyroxen  . OSA on CPAP    wears cpap  . Shortness of breath    with exertion  . Wears glasses     Past Surgical History:  Procedure Laterality Date  . BACK SURGERY    . CESAREAN SECTION     times 2  . CHOLECYSTECTOMY    . ENDOMETRIAL ABLATION    . INCISION AND DRAINAGE HIP Left 11/10/2015   Procedure: IRRIGATION AND DEBRIDEMENT LEFT HIP INCISION;  Surgeon: Kathryne Hitch, MD;  Location: [facility] OR;  Service: Orthopedics;  Laterality: Left;  . JOINT REPLACEMENT  2011   total left knee  . KNEE ARTHROPLASTY  04/23/2012   right   . KNEE ARTHROSCOPY    . LUMBAR LAMINECTOMY/DECOMPRESSION MICRODISCECTOMY N/A 06/23/2019   Procedure: RIGHT LUMBAR FIVE THROUGH SACRAL ONE PARTIAL HEMILAMINECTOMY WITH  RIGHT LUMBAR FIVE FORAMINOTOMY;  Surgeon: Kerrin Champagne, MD;  Location: [facility] OR;  Service: Orthopedics;  Laterality: N/A;  . LUMBAR WOUND DEBRIDEMENT N/A 06/11/2018   Procedure: LUMBAR WOUND DEBRIDEMENT;  Surgeon: Kerrin Champagne, MD;  Location: [facility] OR;  Service: Orthopedics;  Laterality: N/A;  . RADIOLOGY WITH ANESTHESIA N/A 09/09/2018   Procedure: LUMBER SPINE WITHOUT CONTRAST;  Surgeon: Radiologist, Medication, MD;  Location: [facility] OR;  Service: Radiology;  Laterality: N/A;  . ROTATOR CUFF REPAIR     left   . SHOULDER SURGERY     right to repair ligament tear  . TOTAL HIP ARTHROPLASTY Left 09/09/2015   Procedure: LEFT TOTAL HIP ARTHROPLASTY ANTERIOR APPROACH;  Surgeon: Kathryne Hitch, MD;  Location: [facility];  Service: Orthopedics;  Laterality: Left;  . TOTAL KNEE ARTHROPLASTY  04/23/2012   Procedure: TOTAL KNEE ARTHROPLASTY;  Surgeon: [name], MD;  Location: [facility] OR;  Service: Orthopedics;  Laterality: Right;  Right Total Knee Arthroplasty  . TUBAL LIGATION  1996    There were no vitals filed for this visit.   Subjective Assessment - 07/15/19 1944    Subjective  COVID-19 screening performed upon arrival. Patient arrives  with low back pain and ongoing neurological symptoms R>L after a partial hemilaminectomy at L5-S1 and a foraminotomy at L5 on 06/23/2019. Patient reports pain with ADLs and home activities and reports getting assistance from children particularly with lower body dressing. Patient reports being compliant with exercises provided by home health physical therapists and with precautions of no bending lifting greater than 5-10 lbs and no twisting. Patient reports pain at worst as 7/10 and pain at best as 2/10 with rest and medication. Patient's goals are to decrease pain, improve movement, improve strength, and return to walking program for exercise.    Pertinent History  RTC surgery, lumbar surgery, hip surgery.    How long can you walk comfortably?  comfortably around the home,  needs UE support for community ambulation    Diagnostic tests  x-ray & MRI    Currently in Pain?  Yes    Pain Score  3     Pain Location  Back    Pain Orientation  Lower    Pain Descriptors / Indicators  Aching;Sore;Discomfort    Pain Type  Surgical pain    Pain Radiating Towards  bilateral knees    Pain Onset  1 to 4 weeks ago    Pain Frequency  Constant    Aggravating Factors   movement, being up all day    Pain Relieving Factors  resting, pain medication    Effect of Pain on Daily Activities  difficult to stand, getting out of bed, lower body dressing         [facility] PT Assessment - 07/15/19 0001      Assessment   Medical Diagnosis  Neuropathic pain, Fusion of lumbar spine, Chronic pain    Referring Provider (PT)  Vira BrownsJames Nitka, MD    Onset Date/Surgical Date  06/23/19    Next MD Visit  08/11/2019    Prior Therapy  yes      Precautions   Precautions  Back    Precaution Comments  No bending lifting >5 lbs or twisting      Restrictions   Weight Bearing Restrictions  No      Balance Screen   Has the patient fallen in the past 6 months  Yes    How many times?  2    Has the patient had a decrease in activity level because of a fear of falling?   No    Is the patient reluctant to leave their home because of a fear of falling?   No      Home Environment   Living Environment  Private residence      Prior Function   Level of Independence  Needs assistance with ADLs;Needs assistance with homemaking      Observation/Other Assessments   Skin Integrity  notable increase of scar tissue around incisional area, one small area of redness on scar where skin is still healing    Focus on Therapeutic Outcomes (FOTO)   62% limitation      ROM / Strength   AROM / PROM / Strength  Strength      Strength   Strength Assessment Site  Knee;Hip    Right/Left Hip  Right;Left    Right Hip ABduction  4+/5   in sitting   Right Hip ADduction  4+/5   in sitting   Left Hip ABduction  4+/5    in sitting   Left Hip ADduction  4+/5   in sitting   Right/Left Knee  Right;Left  Right Knee Flexion  5/5    Right Knee Extension  5/5    Left Knee Flexion  5/5    Left Knee Extension  5/5      Palpation   Palpation comment  increased tenderness to palpation along scar, along bilateral lumbar paraspinals, increased tenderness to right lateral glute and distal ITB      Ambulation/Gait   Gait Pattern  Step-through pattern;Decreased stance time - right;Antalgic                Objective measurements completed on examination: See above findings.              PT Education - 07/15/19 2008    Education Details  draw ins, supine marching, hip adduction, hip abduction with red theraband    Person(s) Educated  Patient    Methods  Explanation;Demonstration;Handout    Comprehension  Verbalized understanding          PT Long Term Goals - 07/15/19 2022      PT LONG TERM GOAL #1   Title  Patient will be independent with HEP    Time  6    Period  Weeks    Status  New      PT LONG TERM GOAL #2   Title  Patient will report ability to walk community distance with UE support with low back pain less than or equal to 3/10.    Time  6    Period  Weeks    Status  New      PT LONG TERM GOAL #3   Title  Patient will demonstrate proper bed mobilty to protect spine during transfers.    Time  6    Period  Weeks    Status  New      PT LONG TERM GOAL #4   Title  Patient will report ability to perform ADLs independently with low back pain less than or equal to 3/10.    Time  6    Period  Weeks    Status  New      PT LONG TERM GOAL #5   Title  --             Plan - 07/15/19 2020    Clinical Impression Statement  Patient is a 48 year old female who presents to physical therapy with bilateral low back pain and neurological symptoms down right LE to knee and neurological symptoms from left mid lateral thigh to lateral knee. Patient noted with [facility] LE MMT  bilaterally. Patient noted with increase scar tissue around incisional area with one notable area of redness where skin is still healing. Patient ambulates with an antalgic gait pattern, wide base of support, and decreased right stance time. Patient and PT discussed HEP and plan of care to address deficits and address functional goals. Patient reported understanding and compliance. Patient would benefit from skilled physical therapy to address deficits and address patient's goals.    Personal Factors and Comorbidities  Comorbidity 1    Comorbidities  hemilaminectomy L5-S1 06/23/2019, RTC surgery, lumbar surgery, hip surgery.    Examination-Activity Limitations  Locomotion Level;Bed Mobility;Bend;Hygiene/Grooming;Stand;Squat;Sleep    Examination-Participation Restrictions  Cleaning;Shop    Stability/Clinical Decision Making  Stable/Uncomplicated    Clinical Decision Making  Low    Rehab Potential  Good    PT Frequency  2x / week    PT Duration  6 weeks    PT Treatment/Interventions  Cryotherapy;Electrical Stimulation;Ultrasound;Therapeutic activities;Therapeutic exercise;Manual techniques;Patient/family education;Passive range  of motion;Joint Manipulations    PT Next Visit Plan  Nustep, core strengthening, body mechanics, scar mobilization once completely healed, modalities PRN for pain relief.    PT Home Exercise Plan  see patient education section    Consulted and Agree with Plan of Care  Patient       Patient will benefit from skilled therapeutic intervention in order to improve the following deficits and impairments:  Pain, Decreased activity tolerance, Difficulty walking, Decreased scar mobility, Postural dysfunction, Obesity  Visit Diagnosis: Acute bilateral low back pain with bilateral sciatica  Abnormal posture     Problem List Patient Active Problem List   Diagnosis Date Noted  . Other spondylosis with radiculopathy, lumbar region   . Status post lumbar laminectomy 06/23/2019   . C. difficile colitis 05/22/2019  . Hypophosphatemia 05/21/2019  . Syncope 05/20/2019  . OSA on CPAP   . Acute gastroenteritis   . Anemia due to blood loss, acute 06/18/2018    Class: Acute  . Plantar fasciitis of left foot 06/16/2018    Class: Chronic  . Tendonitis, Achilles, right 06/16/2018    Class: Chronic  . Osteochondral talar dome lesion 06/16/2018    Class: Chronic  . Deep incisional surgical site infection   . Superficial incisional surgical site infection 06/11/2018    Class: Acute  . Right ankle effusion 06/11/2018    Class: Acute  . Morbid (severe) obesity due to excess calories (HCC) 05/29/2018    Class: Chronic  . Spondylolisthesis, lumbar region 05/27/2018    Class: Chronic  . Spinal stenosis of lumbar region 05/27/2018    Class: Chronic  . Urinary tract infection 05/27/2018    Class: Acute  . Fusion of spine of lumbar region 05/27/2018  . Pain of right hip joint 07/03/2017  . Acute right-sided low back pain with right-sided sciatica 03/27/2017  . Chronic right shoulder pain 02/13/2017  . History of rotator cuff tear 11/30/2016  . Impingement syndrome of right shoulder 11/30/2016  . Exacerbation of asthma 07/18/2016  . Hypokalemia 07/18/2016  . Hypertension 07/18/2016  . Depression with anxiety 07/18/2016  . Hypothyroidism 07/18/2016  . Hyperglycemia 07/18/2016  . Acute asthma exacerbation 07/18/2016  . Surgical wound dehiscence left hip; questionable superficial infection 11/10/2015  . Left hip postoperative wound infection 11/10/2015  . Osteoarthritis of left hip 09/09/2015  . 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 Obesity (BMI 35.0-39.9 without comorbidity) 04/28/2013    Guss Bunde, PT, DPT 07/15/2019, 8:30 PM  [facility] [address] [place], [place], [address] Phone: [phone]   Fax:  [phone]  Name: Marilyn [name] MRN: [id number] Date of Birth: 1971-08-21

## 2019-07-16 ENCOUNTER — Telehealth: Payer: Self-pay | Admitting: Specialist

## 2019-07-16 NOTE — Telephone Encounter (Signed)
Pt called in said the medications that dr.nitka sent to her pharmacy need to be changed to something different due to her insurance not covering those medications.   [place]arm[facility] [place] [facility] [place]

## 2019-07-17 ENCOUNTER — Other Ambulatory Visit: Payer: Self-pay

## 2019-07-17 ENCOUNTER — Ambulatory Visit: Payer: Medicare Other | Admitting: Physical Therapy

## 2019-07-17 ENCOUNTER — Other Ambulatory Visit: Payer: Self-pay | Admitting: Specialist

## 2019-07-17 ENCOUNTER — Encounter: Payer: Self-pay | Admitting: Physical Therapy

## 2019-07-17 DIAGNOSIS — M5442 Lumbago with sciatica, left side: Secondary | ICD-10-CM

## 2019-07-17 DIAGNOSIS — M5441 Lumbago with sciatica, right side: Secondary | ICD-10-CM

## 2019-07-17 DIAGNOSIS — R293 Abnormal posture: Secondary | ICD-10-CM

## 2019-07-17 MED ORDER — HYDROCODONE-ACETAMINOPHEN 5-325 MG PO TABS: 1.0000 | ORAL_TABLET | Freq: Four times a day (QID) | ORAL | 0 refills | Status: AC | PRN

## 2019-07-17 NOTE — Telephone Encounter (Signed)
Pt called in checking on her medication, said she never heard anything and her pharmacy told her dr.nitka canceled the prescription.   Please give her a call [phone]

## 2019-07-17 NOTE — Telephone Encounter (Signed)
I called and advsied that insured denied via prior auths for meds, Dr. Louanne Skye is going to send in [place] for her

## 2019-07-17 NOTE — Telephone Encounter (Signed)
Rx for hydrocodone 5 mg sent to pharmacy #[id number].

## 2019-07-17 NOTE — Therapy (Signed)
[facility] [address] [place], [place], [address] Phone: [phone]   Fax:  [phone]  Physical Therapy Treatment  Patient Details  Name: Marilyn [name] MRN: [id number] Date of Birth: 1971/03/28 Referring Provider (PT): Vira BrownsJames Nitka, MD   Encounter Date: 07/17/2019  PT End of Session - 07/17/19 1217    Visit Number  2    Number of Visits  12    Date for PT Re-Evaluation  09/02/19    Authorization Type  FOTO; PROGRESS NOTE AT 10TH VISIT.  KX MODIFIER AFTER 15 VISITS.    PT Start Time  1030    PT Stop Time  1120    PT Time Calculation (min)  50 min    Activity Tolerance  Patient tolerated treatment well    Behavior During Therapy  WFL for tasks assessed/performed       Past Medical History:  Diagnosis Date  . Anemia    taking iron now. pt states having no current issues 09/02/2015  . Anginal pain (HCC)    pt states experiences chest wall pain pt states related to her asthma   . Anxiety    with MRI's  . Arthritis    Everywhere  . Asthma   . Depression   . Dizziness   . GERD (gastroesophageal reflux disease)   . Headache(784.0)    HX OF MIGRAINES  . History of bronchitis   . Hypertension   . Hypothyroidism    takes levothyroxen  . OSA on CPAP    wears cpap  . Shortness of breath    with exertion  . Wears glasses     Past Surgical History:  Procedure Laterality Date  . BACK SURGERY    . CESAREAN SECTION     times 2  . CHOLECYSTECTOMY    . ENDOMETRIAL ABLATION    . INCISION AND DRAINAGE HIP Left 11/10/2015   Procedure: IRRIGATION AND DEBRIDEMENT LEFT HIP INCISION;  Surgeon: Kathryne Hitchhristopher Y Blackman, MD;  Location: [facility] OR;  Service: Orthopedics;  Laterality: Left;  . JOINT REPLACEMENT  2011   total left knee  . KNEE ARTHROPLASTY  04/23/2012   right   . KNEE ARTHROSCOPY    . LUMBAR LAMINECTOMY/DECOMPRESSION MICRODISCECTOMY N/A 06/23/2019   Procedure: RIGHT LUMBAR FIVE THROUGH SACRAL ONE PARTIAL HEMILAMINECTOMY  WITH RIGHT LUMBAR FIVE FORAMINOTOMY;  Surgeon: Kerrin ChampagneNitka, James E, MD;  Location: [facility] OR;  Service: Orthopedics;  Laterality: N/A;  . LUMBAR WOUND DEBRIDEMENT N/A 06/11/2018   Procedure: LUMBAR WOUND DEBRIDEMENT;  Surgeon: Kerrin ChampagneNitka, James E, MD;  [facility];  Service: Orthopedics;  Laterality: N/A;  . RADIOLOGY WITH ANESTHESIA N/A 09/09/2018   Procedure: LUMBER SPINE WITHOUT CONTRAST;  Surgeon: Radiologist, Medication, MD;  Location: [facility] OR;  Service: Radiology;  Laterality: N/A;  . ROTATOR CUFF REPAIR     left   . SHOULDER SURGERY     right to repair ligament tear  . TOTAL HIP ARTHROPLASTY Left 09/09/2015   Procedure: LEFT TOTAL HIP ARTHROPLASTY ANTERIOR APPROACH;  Surgeon: Kathryne Hitchhristopher Y Blackman, MD;  Location: [facility];  Service: Orthopedics;  Laterality: Left;  . TOTAL KNEE ARTHROPLASTY  04/23/2012   Procedure: TOTAL KNEE ARTHROPLASTY;  Surgeon: Nadara MustardMarcus V Duda, MD;  Location: [facility] OR;  Service: Orthopedics;  Laterality: Right;  Right Total Knee Arthroplasty  . TUBAL LIGATION  1996    There were no vitals filed for this visit.  Subjective Assessment - 07/17/19 1039    Subjective  COVID-19 screening performed upon arrival. Patient reports  feeling good but with 4/10 pain. Patient reported she had a lumbar fusion in September 2020 but it got infected and had to have her most recent surgery on 06/23/2019.    Pertinent History  Fusion 05/2019, L5-S1 decompression 06/2019, RTC surgery, lumbar surgery, hip surgery.    How long can you walk comfortably?  comfortably around the home, needs UE support for community ambulation    Diagnostic tests  x-ray & MRI    Patient Stated Goals  Get out of pain.    Currently in Pain?  Yes    Pain Score  4     Pain Location  Back    Pain Orientation  Lower    Pain Descriptors / Indicators  Aching;Sore    Pain Type  Surgical pain    Pain Onset  1 to 4 weeks ago    Pain Frequency  Constant         [facility] PT Assessment - 07/17/19 0001      Assessment   Medical  Diagnosis  Neuropathic pain, Fusion of lumbar spine, Chronic pain    Referring Provider (PT)  Vira Browns, MD    Onset Date/Surgical Date  06/23/19    Next MD Visit  08/11/2019    Prior Therapy  yes      Precautions   Precautions  Back    Precaution Comments  No bending lifting >5 lbs or twisting                   [facility] Adult PT Treatment/Exercise - 07/17/19 0001      Exercises   Exercises  Lumbar      Lumbar Exercises: Supine   Ab Set  20 reps;3 seconds    Glut Set  20 reps;3 seconds    Bent Knee Raise  20 reps;3 seconds    Other Supine Lumbar Exercises  ball squeeze 3" hold x20      Modalities   Modalities  Moist Heat;Electrical Stimulation      Moist Heat Therapy   Number Minutes Moist Heat  10 Minutes    Moist Heat Location  Lumbar Spine      Electrical Stimulation   Electrical Stimulation Location  bilateral low back    Electrical Stimulation Action  IFC    Electrical Stimulation Parameters  80-150 hz x10 mins    Electrical Stimulation Goals  Pain      Manual Therapy   Manual Therapy  Soft tissue mobilization    Soft tissue mobilization  STW to B lumbar paraspinals/ QL region to reduce muscle tightness and pain, Scar massaging to superior aspect of scar.                  PT Long Term Goals - 07/15/19 2022      PT LONG TERM GOAL #1   Title  Patient will be independent with HEP    Time  6    Period  Weeks    Status  New      PT LONG TERM GOAL #2   Title  Patient will report ability to walk community distance with UE support with low back pain less than or equal to 3/10.    Time  6    Period  Weeks    Status  New      PT LONG TERM GOAL #3   Title  Patient will demonstrate proper bed mobilty to protect spine during transfers.    Time  6  Period  Weeks    Status  New      PT LONG TERM GOAL #4   Title  Patient will report ability to perform ADLs independently with low back pain less than or equal to 3/10.    Time  6    Period   Weeks    Status  New      PT LONG TERM GOAL #5   Title  --            Plan - 07/17/19 1207    Clinical Impression Statement  Patient arrives to physical therapy with mild pain in her low back. Patient was able to tolerate treatment well with no increase of pain. Patient demonstrated good form with all exercises after explanation and demonstration. Patient educated on scar massaging and importance to promote a strong and flexible scar. Patient reported undestanding. No adverse affects noted upon removal of modalities.    Personal Factors and Comorbidities  Comorbidity 1    Comorbidities  hemilaminectomy L5-S1 06/23/2019, RTC surgery, lumbar surgery, hip surgery.    Examination-Activity Limitations  Locomotion Level;Bed Mobility;Bend;Hygiene/Grooming;Stand;Squat;Sleep    Examination-Participation Restrictions  Cleaning;Shop    Stability/Clinical Decision Making  Stable/Uncomplicated    Clinical Decision Making  Low    Rehab Potential  Good    PT Frequency  2x / week    PT Duration  6 weeks    PT Treatment/Interventions  Cryotherapy;Electrical Stimulation;Ultrasound;Therapeutic activities;Therapeutic exercise;Manual techniques;Patient/family education;Passive range of motion;Joint Manipulations    PT Next Visit Plan  Nustep, core strengthening, body mechanics, scar mobilization once completely healed, modalities PRN for pain relief.    PT Home Exercise Plan  see patient education section    Consulted and Agree with Plan of Care  Patient       Patient will benefit from skilled therapeutic intervention in order to improve the following deficits and impairments:  Pain, Decreased activity tolerance, Difficulty walking, Decreased scar mobility, Postural dysfunction, Obesity  Visit Diagnosis: Acute bilateral low back pain with bilateral sciatica  Abnormal posture     Problem List Patient Active Problem List   Diagnosis Date Noted  . Other spondylosis with radiculopathy, lumbar  region   . Status post lumbar laminectomy 06/23/2019  . C. difficile colitis 05/22/2019  . Hypophosphatemia 05/21/2019  . Syncope 05/20/2019  . OSA on CPAP   . Acute gastroenteritis   . Anemia due to blood loss, acute 06/18/2018    Class: Acute  . Plantar fasciitis of left foot 06/16/2018    Class: Chronic  . Tendonitis, Achilles, right 06/16/2018    Class: Chronic  . Osteochondral talar dome lesion 06/16/2018    Class: Chronic  . Deep incisional surgical site infection   . Superficial incisional surgical site infection 06/11/2018    Class: Acute  . Right ankle effusion 06/11/2018    Class: Acute  . Morbid (severe) obesity due to excess calories (HCC) 05/29/2018    Class: Chronic  . Spondylolisthesis, lumbar region 05/27/2018    Class: Chronic  . Spinal stenosis of lumbar region 05/27/2018    Class: Chronic  . Urinary tract infection 05/27/2018    Class: Acute  . Fusion of spine of lumbar region 05/27/2018  . Pain of right hip joint 07/03/2017  . Acute right-sided low back pain with right-sided sciatica 03/27/2017  . Chronic right shoulder pain 02/13/2017  . History of rotator cuff tear 11/30/2016  . Impingement syndrome of right shoulder 11/30/2016  . Exacerbation of asthma 07/18/2016  . Hypokalemia 07/18/2016  .  Hypertension 07/18/2016  . Depression with anxiety 07/18/2016  . Hypothyroidism 07/18/2016  . Hyperglycemia 07/18/2016  . Acute asthma exacerbation 07/18/2016  . Surgical wound dehiscence left hip; questionable superficial infection 11/10/2015  . Left hip postoperative wound infection 11/10/2015  . Osteoarthritis of left hip 09/09/2015  . 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 Obesity (BMI 35.0-39.9 without comorbidity) 04/28/2013    Gabriela Eves, PT, DPT 07/17/2019, 12:18 PM  [facility] [address] [place], [name], [address] Phone: [phone]   Fax:  [phone]  Name: Marilyn [name] MRN: [id number] Date of Birth: 09/15/1971

## 2019-07-21 ENCOUNTER — Ambulatory Visit: Payer: Medicare Other | Attending: Specialist | Admitting: *Deleted

## 2019-07-21 ENCOUNTER — Other Ambulatory Visit: Payer: Self-pay

## 2019-07-21 DIAGNOSIS — M5441 Lumbago with sciatica, right side: Secondary | ICD-10-CM | POA: Diagnosis present

## 2019-07-21 DIAGNOSIS — R293 Abnormal posture: Secondary | ICD-10-CM

## 2019-07-21 DIAGNOSIS — M25572 Pain in left ankle and joints of left foot: Secondary | ICD-10-CM | POA: Diagnosis present

## 2019-07-21 DIAGNOSIS — M5442 Lumbago with sciatica, left side: Secondary | ICD-10-CM

## 2019-07-21 NOTE — Therapy (Signed)
[facility] [place], [name], [address] Phone: [phone]   Fax:  [phone]  Physical Therapy Treatment  Patient Details  Name: Marilyn [name] MRN: [id number] Date of Birth: 1971-03-30 Referring Provider (PT): Basil Dess, MD   Encounter Date: 07/21/2019  PT End of Session - 07/21/19 7262    Visit Number  3    Number of Visits  12    Date for PT Re-Evaluation  09/02/19    Authorization Type  FOTO; PROGRESS NOTE AT 10TH VISIT.  KX MODIFIER AFTER 15 VISITS.    PT Start Time  1030    PT Stop Time  1120    PT Time Calculation (min)  50 min       Past Medical History:  Diagnosis Date  . Anemia    taking iron now. pt states having no current issues 09/02/2015  . Anginal pain ([place])    pt states experiences chest wall pain pt states related to her asthma   . Anxiety    with MRI's  . Arthritis    Everywhere  . Asthma   . Depression   . Dizziness   . GERD (gastroesophageal reflux disease)   . Headache(784.0)    HX OF MIGRAINES  . History of bronchitis   . Hypertension   . Hypothyroidism    takes levothyroxen  . OSA on CPAP    wears cpap  . Shortness of breath    with exertion  . Wears glasses     Past Surgical History:  Procedure Laterality Date  . BACK SURGERY    . CESAREAN SECTION     times 2  . CHOLECYSTECTOMY    . ENDOMETRIAL ABLATION    . INCISION AND DRAINAGE HIP Left 11/10/2015   Procedure: IRRIGATION AND DEBRIDEMENT LEFT HIP INCISION;  Surgeon: Mcarthur Rossetti, MD;  Location: [facility];  Service: Orthopedics;  Laterality: Left;  . JOINT REPLACEMENT  2011   total left knee  . KNEE ARTHROPLASTY  04/23/2012   right   . KNEE ARTHROSCOPY    . LUMBAR LAMINECTOMY/DECOMPRESSION MICRODISCECTOMY N/A 06/23/2019   Procedure: RIGHT LUMBAR FIVE THROUGH SACRAL ONE PARTIAL HEMILAMINECTOMY WITH RIGHT LUMBAR FIVE FORAMINOTOMY;  Surgeon: Jessy Oto, MD;  Location: [facility];  Service: Orthopedics;   Laterality: N/A;  . LUMBAR WOUND DEBRIDEMENT N/A 06/11/2018   Procedure: LUMBAR WOUND DEBRIDEMENT;  Surgeon: Jessy Oto, MD;  Location: [facility];  Service: Orthopedics;  Laterality: N/A;  . RADIOLOGY WITH ANESTHESIA N/A 09/09/2018   Procedure: LUMBER SPINE WITHOUT CONTRAST;  Surgeon: Radiologist, Medication, MD;  Location: [place];  Service: Radiology;  Laterality: N/A;  . ROTATOR CUFF REPAIR     left   . SHOULDER SURGERY     right to repair ligament tear  . TOTAL HIP ARTHROPLASTY Left 09/09/2015   Procedure: LEFT TOTAL HIP ARTHROPLASTY ANTERIOR APPROACH;  Surgeon: Mcarthur Rossetti, MD;  Location: [facility];  Service: Orthopedics;  Laterality: Left;  . TOTAL KNEE ARTHROPLASTY  04/23/2012   Procedure: TOTAL KNEE ARTHROPLASTY;  Surgeon: Newt Minion, MD;  Location: [facility];  Service: Orthopedics;  Laterality: Right;  Right Total Knee Arthroplasty  . TUBAL LIGATION  1996    There were no vitals filed for this visit.  Subjective Assessment - 07/21/19 1034    Subjective  COVID-19 screening performed upon arrival. Patient reports feeling good but with 4/10 pain.[name] 5-6/10 today    Pertinent History  Fusion 05/2019, L5-S1 decompression 06/2019, RTC surgery,  lumbar surgery, hip surgery.    How long can you walk comfortably?  comfortably around the home, needs UE support for community ambulation    Diagnostic tests  x-ray & MRI    Patient Stated Goals  Get out of pain.    Pain Score  6     Pain Location  Back    Pain Orientation  Lower    Pain Descriptors / Indicators  Aching;Sore    Pain Onset  1 to 4 weeks ago                       [facility] Adult PT Treatment/Exercise - 07/21/19 0001      Exercises   Exercises  Lumbar;Knee/Hip      Lumbar Exercises: Aerobic   Nustep  L4 x 10 mins seat 10      Lumbar Exercises: Supine   Ab Set  20 reps;3 seconds    Glut Set  20 reps;3 seconds    Bent Knee Raise  20 reps;3 seconds    Other Supine Lumbar Exercises  ball squeeze 3" hold x20       Modalities   Modalities  Moist Heat;Electrical Stimulation      Moist Heat Therapy   Number Minutes Moist Heat  15 Minutes    Moist Heat Location  Lumbar Spine      Electrical Stimulation   Electrical Stimulation Location  bilateral low back    Electrical Stimulation Action  IFC seated    Electrical Stimulation Parameters  80-150hz  x15 mins    Electrical Stimulation Goals  Pain      Manual Therapy   Manual Therapy  Soft tissue mobilization    Manual therapy comments  in sitting    Soft tissue mobilization  STW to B lumbar paraspinals/ QL region to reduce muscle tightness and pain, Scar massaging to superior aspect of scar.                  PT Long Term Goals - 07/15/19 2022      PT LONG TERM GOAL #1   Title  Patient will be independent with HEP    Time  6    Period  Weeks    Status  New      PT LONG TERM GOAL #2   Title  Patient will report ability to walk community distance with UE support with low back pain less than or equal to 3/10.    Time  6    Period  Weeks    Status  New      PT LONG TERM GOAL #3   Title  Patient will demonstrate proper bed mobilty to protect spine during transfers.    Time  6    Period  Weeks    Status  New      PT LONG TERM GOAL #4   Title  Patient will report ability to perform ADLs independently with low back pain less than or equal to 3/10.    Time  6    Period  Weeks    Status  New      PT LONG TERM GOAL #5   Title  --            Plan - 07/21/19 1036    Comorbidities  hemilaminectomy L5-S1 06/23/2019, RTC surgery, lumbar surgery, hip surgery.    Examination-Activity Limitations  Locomotion Level;Bed Mobility;Bend;Hygiene/Grooming;Stand;Squat;Sleep    Examination-Participation Restrictions  Cleaning;Shop    Stability/Clinical Decision Making  Stable/Uncomplicated    Rehab Potential  Good    PT Frequency  2x / week    PT Duration  6 weeks    PT Treatment/Interventions  Cryotherapy;Electrical  Stimulation;Ultrasound;Therapeutic activities;Therapeutic exercise;Manual techniques;Patient/family education;Passive range of motion;Joint Manipulations    PT Next Visit Plan  Nustep, core strengthening, body mechanics, scar mobilization once completely healed, modalities PRN for pain relief.    PT Home Exercise Plan  see patient education section    Consulted and Agree with Plan of Care  Patient       Patient will benefit from skilled therapeutic intervention in order to improve the following deficits and impairments:  Pain, Decreased activity tolerance, Difficulty walking, Decreased scar mobility, Postural dysfunction, Obesity  Visit Diagnosis: Abnormal posture  Acute bilateral low back pain with bilateral sciatica  Pain in left ankle and joints of left foot  Acute right-sided low back pain with right-sided sciatica     Problem List Patient Active Problem List   Diagnosis Date Noted  . Other spondylosis with radiculopathy, lumbar region   . Status post lumbar laminectomy 06/23/2019  . C. difficile colitis 05/22/2019  . Hypophosphatemia 05/21/2019  . Syncope 05/20/2019  . OSA on CPAP   . Acute gastroenteritis   . Anemia due to blood loss, acute 06/18/2018    Class: Acute  . Plantar fasciitis of left foot 06/16/2018    Class: Chronic  . Tendonitis, Achilles, right 06/16/2018    Class: Chronic  . Osteochondral talar dome lesion 06/16/2018    Class: Chronic  . Deep incisional surgical site infection   . Superficial incisional surgical site infection 06/11/2018    Class: Acute  . Right ankle effusion 06/11/2018    Class: Acute  . Morbid (severe) obesity due to excess calories ([facility]) 05/29/2018    Class: Chronic  . Spondylolisthesis, lumbar region 05/27/2018    Class: Chronic  . Spinal stenosis of lumbar region 05/27/2018    Class: Chronic  . Urinary tract infection 05/27/2018    Class: Acute  . Fusion of spine of lumbar region 05/27/2018  . Pain of right hip joint  07/03/2017  . Acute right-sided low back pain with right-sided sciatica 03/27/2017  . Chronic right shoulder pain 02/13/2017  . History of rotator cuff tear 11/30/2016  . Impingement syndrome of right shoulder 11/30/2016  . Exacerbation of asthma 07/18/2016  . Hypokalemia 07/18/2016  . Hypertension 07/18/2016  . Depression with anxiety 07/18/2016  . Hypothyroidism 07/18/2016  . Hyperglycemia 07/18/2016  . Acute asthma exacerbation 07/18/2016  . Surgical wound dehiscence left hip; questionable superficial infection 11/10/2015  . Left hip postoperative wound infection 11/10/2015  . Osteoarthritis of left hip 09/09/2015  . 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 Obesity (BMI 35.0-39.9 without comorbidity) 04/28/2013    Tytus Strahle,CHRIS, PTA 07/21/2019, 2:55 PM  [facility] [address] [place], [place], [address] Phone: [phone]   Fax:  [phone]  Name: Marilyn Redderhelma M [name] MRN: [id number] Date of Birth: December 06, 1970

## 2019-07-23 ENCOUNTER — Ambulatory Visit: Payer: Medicare Other | Admitting: *Deleted

## 2019-07-23 ENCOUNTER — Other Ambulatory Visit: Payer: Self-pay

## 2019-07-23 DIAGNOSIS — M5442 Lumbago with sciatica, left side: Secondary | ICD-10-CM

## 2019-07-23 DIAGNOSIS — R293 Abnormal posture: Secondary | ICD-10-CM | POA: Diagnosis not present

## 2019-07-23 DIAGNOSIS — M5441 Lumbago with sciatica, right side: Secondary | ICD-10-CM

## 2019-07-23 NOTE — Therapy (Signed)
[facility] [address] [place], [place], [address] Phone: [phone]   Fax:  [phone]  Physical Therapy Treatment  Patient Details  Name: Marilyn [name] MRN: [id number] Date of Birth: 04-03-1971 Referring Provider (PT): Vira BrownsJames Nitka, MD   Encounter Date: 07/23/2019  PT End of Session - 07/23/19 1122    Visit Number  4    Number of Visits  12    Date for PT Re-Evaluation  09/02/19    Authorization Type  FOTO; PROGRESS NOTE AT 10TH VISIT.  KX MODIFIER AFTER 15 VISITS.    PT Start Time  1030    PT Stop Time  1120    PT Time Calculation (min)  50 min       Past Medical History:  Diagnosis Date  . Anemia    taking iron now. pt states having no current issues 09/02/2015  . Anginal pain ([facility])    pt states experiences chest wall pain pt states related to her asthma   . Anxiety    with MRI's  . Arthritis    Everywhere  . Asthma   . Depression   . Dizziness   . GERD (gastroesophageal reflux disease)   . Headache(784.0)    HX OF MIGRAINES  . History of bronchitis   . Hypertension   . Hypothyroidism    takes levothyroxen  . OSA on CPAP    wears cpap  . Shortness of breath    with exertion  . Wears glasses     Past Surgical History:  Procedure Laterality Date  . BACK SURGERY    . CESAREAN SECTION     times 2  . CHOLECYSTECTOMY    . ENDOMETRIAL ABLATION    . INCISION AND DRAINAGE HIP Left 11/10/2015   Procedure: IRRIGATION AND DEBRIDEMENT LEFT HIP INCISION;  Surgeon: Kathryne Hitchhristopher Y Blackman, MD;  [facility];  Service: Orthopedics;  Laterality: Left;  . JOINT REPLACEMENT  2011   total left knee  . KNEE ARTHROPLASTY  04/23/2012   right   . KNEE ARTHROSCOPY    . LUMBAR LAMINECTOMY/DECOMPRESSION MICRODISCECTOMY N/A 06/23/2019   Procedure: RIGHT LUMBAR FIVE THROUGH SACRAL ONE PARTIAL HEMILAMINECTOMY WITH RIGHT LUMBAR FIVE FORAMINOTOMY;  Surgeon: Kerrin ChampagneNitka, James E, MD;  Location[facility];  Service: Orthopedics;   Laterality: N/A;  . LUMBAR WOUND DEBRIDEMENT N/A 06/11/2018   Procedure: LUMBAR WOUND DEBRIDEMENT;  Surgeon: Kerrin ChampagneNitka, James E, MD;  [facility];  Service: Orthopedics;  Laterality: N/A;  . RADIOLOGY WITH ANESTHESIA N/A 09/09/2018   Procedure: LUMBER SPINE WITHOUT CONTRAST;  Surgeon: Radiologist, Medication, MD;  [facility];  Service: Radiology;  Laterality: N/A;  . ROTATOR CUFF REPAIR     left   . SHOULDER SURGERY     right to repair ligament tear  . TOTAL HIP ARTHROPLASTY Left 09/09/2015   Procedure: LEFT TOTAL HIP ARTHROPLASTY ANTERIOR APPROACH;  Surgeon: Kathryne Hitchhristopher Y Blackman, MD;  Location: [facility];  Service: Orthopedics;  Laterality: Left;  . TOTAL KNEE ARTHROPLASTY  04/23/2012   Procedure: TOTAL KNEE ARTHROPLASTY;  Surgeon: Nadara MustardMarcus V Duda, MD;  Location[facility];  Service: Orthopedics;  Laterality: Right;  Right Total Knee Arthroplasty  . TUBAL LIGATION  1996    There were no vitals filed for this visit.  Subjective Assessment - 07/23/19 1042    Subjective  COVID-19 screening performed upon arrival. Patient reports feeling good but with  6/10 soreness    Pertinent History  Fusion 05/2019, L5-S1 decompression 06/2019, RTC surgery, lumbar  surgery, hip surgery.    How long can you walk comfortably?  comfortably around the home, needs UE support for community ambulation    Diagnostic tests  x-ray & MRI    Patient Stated Goals  Get out of pain.    Currently in Pain?  Yes    Pain Score  6     Pain Location  Back    Pain Orientation  Lower    Pain Descriptors / Indicators  Aching;Sore    Pain Type  Surgical pain    Pain Onset  1 to 4 weeks ago                       [facility] Adult PT Treatment/Exercise - 07/23/19 0001      Exercises   Exercises  Lumbar;Knee/Hip      Lumbar Exercises: Aerobic   Nustep  L4  LEs only x 15 mins seat 10 with focus on posture      Modalities   Modalities  Moist Heat;Electrical Stimulation      Moist Heat Therapy   Number Minutes  Moist Heat  15 Minutes    Moist Heat Location  Lumbar Spine      Electrical Stimulation   Electrical Stimulation Location  bilateral low back    Electrical Stimulation Action  IFCx 15 mins 80-150hz     Electrical Stimulation Parameters  seated    Electrical Stimulation Goals  Pain      Manual Therapy   Manual Therapy  Soft tissue mobilization    Manual therapy comments  in sitting    Soft tissue mobilization  STW to LT  lumbar paraspinals/ QL region to reduce muscle tightness and pain, Scar massaging to superior aspect of scar.                  PT Long Term Goals - 07/15/19 2022      PT LONG TERM GOAL #1   Title  Patient will be independent with HEP    Time  6    Period  Weeks    Status  New      PT LONG TERM GOAL #2   Title  Patient will report ability to walk community distance with UE support with low back pain less than or equal to 3/10.    Time  6    Period  Weeks    Status  New      PT LONG TERM GOAL #3   Title  Patient will demonstrate proper bed mobilty to protect spine during transfers.    Time  6    Period  Weeks    Status  New      PT LONG TERM GOAL #4   Title  Patient will report ability to perform ADLs independently with low back pain less than or equal to 3/10.    Time  6    Period  Weeks    Status  New      PT LONG TERM GOAL #5   Title  --            Plan - 07/23/19 1203    Clinical Impression Statement  Pt arrived today doing fair, but with increased LT sided soreness in LB. She did well with therex, but had notable tenderness during STW on LT side paras. Normal modality response today. LTGs ongoing at this time    Comorbidities  hemilaminectomy L5-S1 06/23/2019, RTC surgery, lumbar surgery, hip surgery.  Examination-Activity Limitations  Locomotion Level;Bed Mobility;Bend;Hygiene/Grooming;Stand;Squat;Sleep    Examination-Participation Restrictions  Cleaning;Shop    Stability/Clinical Decision Making  Stable/Uncomplicated    Rehab  Potential  Good    PT Frequency  2x / week    PT Duration  6 weeks    PT Treatment/Interventions  Cryotherapy;Electrical Stimulation;Ultrasound;Therapeutic activities;Therapeutic exercise;Manual techniques;Patient/family education;Passive range of motion;Joint Manipulations    PT Next Visit Plan  Nustep, core strengthening, body mechanics, scar mobilization once completely healed, modalities PRN for pain relief.    PT Home Exercise Plan  see patient education section    Consulted and Agree with Plan of Care  Patient       Patient will benefit from skilled therapeutic intervention in order to improve the following deficits and impairments:  Pain, Decreased activity tolerance, Difficulty walking, Decreased scar mobility, Postural dysfunction, Obesity  Visit Diagnosis: Abnormal posture  Acute bilateral low back pain with bilateral sciatica     Problem List Patient Active Problem List   Diagnosis Date Noted  . Other spondylosis with radiculopathy, lumbar region   . Status post lumbar laminectomy 06/23/2019  . C. difficile colitis 05/22/2019  . Hypophosphatemia 05/21/2019  . Syncope 05/20/2019  . OSA on CPAP   . Acute gastroenteritis   . Anemia due to blood loss, acute 06/18/2018    Class: Acute  . Plantar fasciitis of left foot 06/16/2018    Class: Chronic  . Tendonitis, Achilles, right 06/16/2018    Class: Chronic  . Osteochondral talar dome lesion 06/16/2018    Class: Chronic  . Deep incisional surgical site infection   . Superficial incisional surgical site infection 06/11/2018    Class: Acute  . Right ankle effusion 06/11/2018    Class: Acute  . Morbid (severe) obesity due to excess calories ([place]) 05/29/2018    Class: Chronic  . Spondylolisthesis, lumbar region 05/27/2018    Class: Chronic  . Spinal stenosis of lumbar region 05/27/2018    Class: Chronic  . Urinary tract infection 05/27/2018    Class: Acute  . Fusion of spine of lumbar region 05/27/2018  . Pain of  right hip joint 07/03/2017  . Acute right-sided low back pain with right-sided sciatica 03/27/2017  . Chronic right shoulder pain 02/13/2017  . History of rotator cuff tear 11/30/2016  . Impingement syndrome of right shoulder 11/30/2016  . Exacerbation of asthma 07/18/2016  . Hypokalemia 07/18/2016  . Hypertension 07/18/2016  . Depression with anxiety 07/18/2016  . Hypothyroidism 07/18/2016  . Hyperglycemia 07/18/2016  . Acute asthma exacerbation 07/18/2016  . Surgical wound dehiscence left hip; questionable superficial infection 11/10/2015  . Left hip postoperative wound infection 11/10/2015  . Osteoarthritis of left hip 09/09/2015  . 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 Obesity (BMI 35.0-39.9 without comorbidity) 04/28/2013    ,CHRIS, PTA 07/23/2019, 5:09 PM  [facility] [place], [name], [address] Phone: [phone]   Fax:  [phone]  Name: Marilyn [name] MRN: [id number] Date of Birth: 04-13-1971

## 2019-07-29 ENCOUNTER — Ambulatory Visit: Payer: Medicare Other | Admitting: Physical Therapy

## 2019-07-31 ENCOUNTER — Encounter: Payer: Medicare Other | Admitting: *Deleted

## 2019-08-04 ENCOUNTER — Other Ambulatory Visit: Payer: Self-pay

## 2019-08-04 ENCOUNTER — Ambulatory Visit: Payer: Medicare Other | Admitting: Physical Therapy

## 2019-08-04 DIAGNOSIS — R293 Abnormal posture: Secondary | ICD-10-CM

## 2019-08-04 DIAGNOSIS — M5442 Lumbago with sciatica, left side: Secondary | ICD-10-CM

## 2019-08-04 DIAGNOSIS — M5441 Lumbago with sciatica, right side: Secondary | ICD-10-CM

## 2019-08-04 NOTE — Therapy (Signed)
[facility] [place], [name], [address] Phone: [phone]   Fax:  [phone]  Physical Therapy Treatment  Patient Details  Name: Marilyn [name] MRN: [id number] Date of Birth: 02/15/1971 Referring Provider (PT): Basil Dess, MD   Encounter Date: 08/04/2019  PT End of Session - 08/04/19 1358    Visit Number  5    Number of Visits  12    Date for PT Re-Evaluation  09/02/19    Authorization Type  FOTO; PROGRESS NOTE AT 10TH VISIT.  KX MODIFIER AFTER 15 VISITS.    PT Start Time  0105    PT Stop Time  0204    PT Time Calculation (min)  59 min    Activity Tolerance  Patient tolerated treatment well    Behavior During Therapy  [facility] for tasks assessed/performed       Past Medical History:  Diagnosis Date  . Anemia    taking iron now. pt states having no current issues 09/02/2015  . Anginal pain ([place])    pt states experiences chest wall pain pt states related to her asthma   . Anxiety    with MRI's  . Arthritis    Everywhere  . Asthma   . Depression   . Dizziness   . GERD (gastroesophageal reflux disease)   . Headache(784.0)    HX OF MIGRAINES  . History of bronchitis   . Hypertension   . Hypothyroidism    takes levothyroxen  . OSA on CPAP    wears cpap  . Shortness of breath    with exertion  . Wears glasses     Past Surgical History:  Procedure Laterality Date  . BACK SURGERY    . CESAREAN SECTION     times 2  . CHOLECYSTECTOMY    . ENDOMETRIAL ABLATION    . INCISION AND DRAINAGE HIP Left 11/10/2015   Procedure: IRRIGATION AND DEBRIDEMENT LEFT HIP INCISION;  Surgeon: Mcarthur Rossetti, MD;  Location: [place];  Service: Orthopedics;  Laterality: Left;  . JOINT REPLACEMENT  2011   total left knee  . KNEE ARTHROPLASTY  04/23/2012   right   . KNEE ARTHROSCOPY    . LUMBAR LAMINECTOMY/DECOMPRESSION MICRODISCECTOMY N/A 06/23/2019   Procedure: RIGHT LUMBAR FIVE THROUGH SACRAL ONE PARTIAL HEMILAMINECTOMY  WITH RIGHT LUMBAR FIVE FORAMINOTOMY;  Surgeon: Jessy Oto, MD;  Location: [place];  Service: Orthopedics;  Laterality: N/A;  . LUMBAR WOUND DEBRIDEMENT N/A 06/11/2018   Procedure: LUMBAR WOUND DEBRIDEMENT;  Surgeon: Jessy Oto, MD;  Location: [place];  Service: Orthopedics;  Laterality: N/A;  . RADIOLOGY WITH ANESTHESIA N/A 09/09/2018   Procedure: LUMBER SPINE WITHOUT CONTRAST;  Surgeon: Radiologist, Medication, MD;  Location: [place];  Service: Radiology;  Laterality: N/A;  . ROTATOR CUFF REPAIR     left   . SHOULDER SURGERY     right to repair ligament tear  . TOTAL HIP ARTHROPLASTY Left 09/09/2015   Procedure: LEFT TOTAL HIP ARTHROPLASTY ANTERIOR APPROACH;  Surgeon: Mcarthur Rossetti, MD;  Location: [facility];  Service: Orthopedics;  Laterality: Left;  . TOTAL KNEE ARTHROPLASTY  04/23/2012   Procedure: TOTAL KNEE ARTHROPLASTY;  Surgeon: Newt Minion, MD;  Location: [place];  Service: Orthopedics;  Laterality: Right;  Right Total Knee Arthroplasty  . TUBAL LIGATION  1996    There were no vitals filed for this visit.  Subjective Assessment - 08/04/19 1351    Subjective  COVID-19 screen performed prior to patient entering  clinic.  Took a long road trip last week and back hurt a lot.  Doing good today with pain at a 2 today.    Pertinent History  Fusion 05/2019, L5-S1 decompression 06/2019, RTC surgery, lumbar surgery, hip surgery.    How long can you walk comfortably?  comfortably around the home, needs UE support for community ambulation    Diagnostic tests  x-ray & MRI    Patient Stated Goals  Get out of pain.    Currently in Pain?  Yes    Pain Score  2     Pain Location  Back    Pain Onset  1 to 4 weeks ago                       [facility] - 08/04/19 0001      Exercises   Exercises  Knee/Hip      Lumbar Exercises: Aerobic   Nustep  Level (LE's only) x 16 minutes.      Modalities   Modalities  Electrical Stimulation;Moist Heat       Moist Heat Therapy   Number Minutes Moist Heat  20 Minutes    Moist Heat Location  Lumbar Spine      Electrical Stimulation   Electrical Stimulation Location  Bilateral low back.    Electrical Stimulation Action  IFC @ 80-150 Hz x 20 minutes.    Electrical Stimulation Goals  Pain      Manual Therapy   Manual Therapy  Soft tissue mobilization    Soft tissue mobilization  STW/M to affected lumbar region x 10 minutes.                  PT Long Term Goals - 07/15/19 2022      PT LONG TERM GOAL #1   Title  Patient will be independent with HEP    Time  6    Period  Weeks    Status  New      PT LONG TERM GOAL #2   Title  Patient will report ability to walk community distance with UE support with low back pain less than or equal to 3/10.    Time  6    Period  Weeks    Status  New      PT LONG TERM GOAL #3   Title  Patient will demonstrate proper bed mobilty to protect spine during transfers.    Time  6    Period  Weeks    Status  New      PT LONG TERM GOAL #4   Title  Patient will report ability to perform ADLs independently with low back pain less than or equal to 3/10.    Time  6    Period  Weeks    Status  New      PT LONG TERM GOAL #5   Title  --            Plan - 08/04/19 1402    Clinical Impression Statement  The patient had an increase in pain due to a long road trip last week.  She is feeling better today.  Continued tenderness to palpable in lower lumbar region and significant scar tissue.    Personal Factors and Comorbidities  Comorbidity 1    Comorbidities  hemilaminectomy L5-S1 06/23/2019, RTC surgery, lumbar surgery, hip surgery.    Examination-Activity Limitations  Locomotion Level;Bed Mobility;Bend;Hygiene/Grooming;Stand;Squat;Sleep    Examination-Participation Restrictions  Cleaning;Shop  Stability/Clinical Decision Making  Stable/Uncomplicated    Rehab Potential  Good    PT Frequency  2x / week    PT Duration  6 weeks    PT  Treatment/Interventions  Cryotherapy;Electrical Stimulation;Ultrasound;Therapeutic activities;Therapeutic exercise;Manual techniques;Patient/family education;Passive range of motion;Joint Manipulations    PT Next Visit Plan  Nustep, core strengthening, body mechanics, scar mobilization once completely healed, modalities PRN for pain relief.    PT Home Exercise Plan  see patient education section    Consulted and Agree with Plan of Care  Patient       Patient will benefit from skilled therapeutic intervention in order to improve the following deficits and impairments:  Pain, Decreased activity tolerance, Difficulty walking, Decreased scar mobility, Postural dysfunction, Obesity  Visit Diagnosis: Abnormal posture  Acute bilateral low back pain with bilateral sciatica     Problem List Patient Active Problem List   Diagnosis Date Noted  . Other spondylosis with radiculopathy, lumbar region   . Status post lumbar laminectomy 06/23/2019  . C. difficile colitis 05/22/2019  . Hypophosphatemia 05/21/2019  . Syncope 05/20/2019  . OSA on CPAP   . Acute gastroenteritis   . Anemia due to blood loss, acute 06/18/2018    Class: Acute  . Plantar fasciitis of left foot 06/16/2018    Class: Chronic  . Tendonitis, Achilles, right 06/16/2018    Class: Chronic  . Osteochondral talar dome lesion 06/16/2018    Class: Chronic  . Deep incisional surgical site infection   . Superficial incisional surgical site infection 06/11/2018    Class: Acute  . Right ankle effusion 06/11/2018    Class: Acute  . Morbid (severe) obesity due to excess calories (HCC) 05/29/2018    Class: Chronic  . Spondylolisthesis, lumbar region 05/27/2018    Class: Chronic  . Spinal stenosis of lumbar region 05/27/2018    Class: Chronic  . Urinary tract infection 05/27/2018    Class: Acute  . Fusion of spine of lumbar region 05/27/2018  . Pain of right hip joint 07/03/2017  . Acute right-sided low back pain with  right-sided sciatica 03/27/2017  . Chronic right shoulder pain 02/13/2017  . History of rotator cuff tear 11/30/2016  . Impingement syndrome of right shoulder 11/30/2016  . Exacerbation of asthma 07/18/2016  . Hypokalemia 07/18/2016  . Hypertension 07/18/2016  . Depression with anxiety 07/18/2016  . Hypothyroidism 07/18/2016  . Hyperglycemia 07/18/2016  . Acute asthma exacerbation 07/18/2016  . Surgical wound dehiscence left hip; questionable superficial infection 11/10/2015  . Left hip postoperative wound infection 11/10/2015  . Osteoarthritis of left hip 09/09/2015  . 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 Obesity (BMI 35.0-39.9 without comorbidity) 04/28/2013    Saivon Prowse, [place] MPT 08/04/2019, 2:12 PM  [facility] [address] [place], [place], [address] Phone: [phone]   Fax:  [phone]  Name: Marilyn [name] MRN: [id number] Date of Birth: [birth year]

## 2019-08-06 ENCOUNTER — Ambulatory Visit: Payer: Medicare Other | Admitting: *Deleted

## 2019-08-06 ENCOUNTER — Other Ambulatory Visit: Payer: Self-pay

## 2019-08-06 DIAGNOSIS — R293 Abnormal posture: Secondary | ICD-10-CM | POA: Diagnosis not present

## 2019-08-06 DIAGNOSIS — M5441 Lumbago with sciatica, right side: Secondary | ICD-10-CM

## 2019-08-06 DIAGNOSIS — M5442 Lumbago with sciatica, left side: Secondary | ICD-10-CM

## 2019-08-06 NOTE — Therapy (Signed)
[facility] [place], [name], [address] Phone: [phone]   Fax:  [phone]  Physical Therapy Treatment  Patient Details  Name: Marilyn [name] MRN: [id number] Date of Birth: 03/31/1971 Referring Provider (PT): Basil Dess, MD   Encounter Date: 08/06/2019  PT End of Session - 08/06/19 1751    Visit Number  6    Number of Visits  12    Date for PT Re-Evaluation  09/02/19    Authorization Type  FOTO; PROGRESS NOTE AT 10TH VISIT.  KX MODIFIER AFTER 15 VISITS.    PT Start Time  1352    PT Stop Time  1440    PT Time Calculation (min)  48 min       Past Medical History:  Diagnosis Date  . Anemia    taking iron now. pt states having no current issues 09/02/2015  . Anginal pain ([place])    pt states experiences chest wall pain pt states related to her asthma   . Anxiety    with MRI's  . Arthritis    Everywhere  . Asthma   . Depression   . Dizziness   . GERD (gastroesophageal reflux disease)   . Headache(784.0)    HX OF MIGRAINES  . History of bronchitis   . Hypertension   . Hypothyroidism    takes levothyroxen  . OSA on CPAP    wears cpap  . Shortness of breath    with exertion  . Wears glasses     Past Surgical History:  Procedure Laterality Date  . BACK SURGERY    . CESAREAN SECTION     times 2  . CHOLECYSTECTOMY    . ENDOMETRIAL ABLATION    . INCISION AND DRAINAGE HIP Left 11/10/2015   Procedure: IRRIGATION AND DEBRIDEMENT LEFT HIP INCISION;  Surgeon: Mcarthur Rossetti, MD;  Location: [place];  Service: Orthopedics;  Laterality: Left;  . JOINT REPLACEMENT  2011   total left knee  . KNEE ARTHROPLASTY  04/23/2012   right   . KNEE ARTHROSCOPY    . LUMBAR LAMINECTOMY/DECOMPRESSION MICRODISCECTOMY N/A 06/23/2019   Procedure: RIGHT LUMBAR FIVE THROUGH SACRAL ONE PARTIAL HEMILAMINECTOMY WITH RIGHT LUMBAR FIVE FORAMINOTOMY;  Surgeon: Jessy Oto, MD;  Location: [place];  Service: Orthopedics;   Laterality: N/A;  . LUMBAR WOUND DEBRIDEMENT N/A 06/11/2018   Procedure: LUMBAR WOUND DEBRIDEMENT;  Surgeon: Jessy Oto, MD;  Location: [place];  Service: Orthopedics;  Laterality: N/A;  . RADIOLOGY WITH ANESTHESIA N/A 09/09/2018   Procedure: LUMBER SPINE WITHOUT CONTRAST;  Surgeon: Radiologist, Medication, MD;  Location: [place];  Service: Radiology;  Laterality: N/A;  . ROTATOR CUFF REPAIR     left   . SHOULDER SURGERY     right to repair ligament tear  . TOTAL HIP ARTHROPLASTY Left 09/09/2015   Procedure: LEFT TOTAL HIP ARTHROPLASTY ANTERIOR APPROACH;  Surgeon: Mcarthur Rossetti, MD;  Location: [facility];  Service: Orthopedics;  Laterality: Left;  . TOTAL KNEE ARTHROPLASTY  04/23/2012   Procedure: TOTAL KNEE ARTHROPLASTY;  Surgeon: Newt Minion, MD;  Location: [place];  Service: Orthopedics;  Laterality: Right;  Right Total Knee Arthroplasty  . TUBAL LIGATION  1996    There were no vitals filed for this visit.  Subjective Assessment - 08/06/19 1352    Subjective  COVID-19 screen performed prior to patient entering clinic. Doing better    Pertinent History  Fusion 05/2019, L5-S1 decompression 06/2019, RTC surgery, lumbar surgery, hip surgery.  How long can you walk comfortably?  comfortably around the home, needs UE support for community ambulation    Diagnostic tests  x-ray & MRI    Patient Stated Goals  Get out of pain.    Currently in Pain?  Yes    Pain Score  4     Pain Location  Back    Pain Orientation  Lower    Pain Descriptors / Indicators  Aching;Sore    Pain Type  Surgical pain    Pain Onset  1 to 4 weeks ago                       [facility] Adult PT Treatment/Exercise - 08/06/19 0001      Exercises   Exercises  Knee/Hip      Lumbar Exercises: Aerobic   Nustep  Level (LE's only) x 15 minutes. L4      Modalities   Modalities  Electrical Stimulation;Moist Heat      Moist Heat Therapy   Number Minutes Moist Heat  15 Minutes    Moist Heat Location   Lumbar Spine      Electrical Stimulation   Electrical Stimulation Location  Bilateral low back.    Electrical Stimulation Action  IFC x 15 mins 80-150hz     Electrical Stimulation Parameters  seated    Electrical Stimulation Goals  Pain      Manual Therapy   Manual Therapy  Soft tissue mobilization    Manual therapy comments  seated    Soft tissue mobilization  STW/M to affected lumbar region x 12 minutes.                  PT Long Term Goals - 07/15/19 2022      PT LONG TERM GOAL #1   Title  Patient will be independent with HEP    Time  6    Period  Weeks    Status  New      PT LONG TERM GOAL #2   Title  Patient will report ability to walk community distance with UE support with low back pain less than or equal to 3/10.    Time  6    Period  Weeks    Status  New      PT LONG TERM GOAL #3   Title  Patient will demonstrate proper bed mobilty to protect spine during transfers.    Time  6    Period  Weeks    Status  New      PT LONG TERM GOAL #4   Title  Patient will report ability to perform ADLs independently with low back pain less than or equal to 3/10.    Time  6    Period  Weeks    Status  New      PT LONG TERM GOAL #5   Title  --            Plan - 08/06/19 1441    Clinical Impression Statement  Pt arrived today doing better than last visit with decreased pain. She was able to perform therex without increased pain , but with notable soreness during STW to BIL LB paras in sitting. Normal modality response today    Personal Factors and Comorbidities  Comorbidity 1    Comorbidities  hemilaminectomy L5-S1 06/23/2019, RTC surgery, lumbar surgery, hip surgery.    Examination-Activity Limitations  Locomotion Level;Bed Mobility;Bend;Hygiene/Grooming;Stand;Squat;Sleep    Stability/Clinical Decision Making  Stable/Uncomplicated  Rehab Potential  Good    PT Frequency  2x / week    PT Duration  6 weeks    PT Treatment/Interventions  Cryotherapy;Electrical  Stimulation;Ultrasound;Therapeutic activities;Therapeutic exercise;Manual techniques;Patient/family education;Passive range of motion;Joint Manipulations    PT Next Visit Plan  Nustep, core strengthening, body mechanics, scar mobilization once completely healed, modalities PRN for pain relief.    PT Home Exercise Plan  see patient education section       Patient will benefit from skilled therapeutic intervention in order to improve the following deficits and impairments:  Pain, Decreased activity tolerance, Difficulty walking, Decreased scar mobility, Postural dysfunction, Obesity  Visit Diagnosis: Abnormal posture  Acute bilateral low back pain with bilateral sciatica     Problem List Patient Active Problem List   Diagnosis Date Noted  . Other spondylosis with radiculopathy, lumbar region   . Status post lumbar laminectomy 06/23/2019  . C. difficile colitis 05/22/2019  . Hypophosphatemia 05/21/2019  . Syncope 05/20/2019  . OSA on CPAP   . Acute gastroenteritis   . Anemia due to blood loss, acute 06/18/2018    Class: Acute  . Plantar fasciitis of left foot 06/16/2018    Class: Chronic  . Tendonitis, Achilles, right 06/16/2018    Class: Chronic  . Osteochondral talar dome lesion 06/16/2018    Class: Chronic  . Deep incisional surgical site infection   . Superficial incisional surgical site infection 06/11/2018    Class: Acute  . Right ankle effusion 06/11/2018    Class: Acute  . Morbid (severe) obesity due to excess calories (HCC) 05/29/2018    Class: Chronic  . Spondylolisthesis, lumbar region 05/27/2018    Class: Chronic  . Spinal stenosis of lumbar region 05/27/2018    Class: Chronic  . Urinary tract infection 05/27/2018    Class: Acute  . Fusion of spine of lumbar region 05/27/2018  . Pain of right hip joint 07/03/2017  . Acute right-sided low back pain with right-sided sciatica 03/27/2017  . Chronic right shoulder pain 02/13/2017  . History of rotator cuff tear  11/30/2016  . Impingement syndrome of right shoulder 11/30/2016  . Exacerbation of asthma 07/18/2016  . Hypokalemia 07/18/2016  . Hypertension 07/18/2016  . Depression with anxiety 07/18/2016  . Hypothyroidism 07/18/2016  . Hyperglycemia 07/18/2016  . Acute asthma exacerbation 07/18/2016  . Surgical wound dehiscence left hip; questionable superficial infection 11/10/2015  . Left hip postoperative wound infection 11/10/2015  . Osteoarthritis of left hip 09/09/2015  . 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 Obesity (BMI 35.0-39.9 without comorbidity) 04/28/2013    Lillianah Swartzentruber,CHRIS, PTA 08/06/2019, 5:54 PM  [facility] [address] [place], [place], [address] Phone: [phone]   Fax:  [phone]  Name: Marilyn Redderhelma M [name] MRN: [id number] Date of Birth: 03-05-1971

## 2019-08-07 ENCOUNTER — Ambulatory Visit: Payer: Medicare Other | Admitting: Specialist

## 2019-08-10 ENCOUNTER — Ambulatory Visit (INDEPENDENT_AMBULATORY_CARE_PROVIDER_SITE_OTHER): Payer: Medicare Other | Admitting: Specialist

## 2019-08-10 ENCOUNTER — Encounter: Payer: Self-pay | Admitting: Specialist

## 2019-08-10 ENCOUNTER — Ambulatory Visit (INDEPENDENT_AMBULATORY_CARE_PROVIDER_SITE_OTHER): Payer: Medicare Other

## 2019-08-10 ENCOUNTER — Other Ambulatory Visit: Payer: Self-pay

## 2019-08-10 VITALS — BP 143/90 | HR 71 | Ht 63.0 in | Wt 343.0 lb

## 2019-08-10 DIAGNOSIS — M4326 Fusion of spine, lumbar region: Secondary | ICD-10-CM | POA: Diagnosis not present

## 2019-08-10 DIAGNOSIS — M792 Neuralgia and neuritis, unspecified: Secondary | ICD-10-CM

## 2019-08-10 DIAGNOSIS — M25551 Pain in right hip: Secondary | ICD-10-CM

## 2019-08-10 MED ORDER — PREGABALIN 75 MG PO CAPS: 75.0000 mg | ORAL_CAPSULE | Freq: Two times a day (BID) | ORAL | 3 refills | Status: DC

## 2019-08-10 MED ORDER — HYDROCODONE-ACETAMINOPHEN 5-325 MG PO TABS: 1.0000 | ORAL_TABLET | Freq: Four times a day (QID) | ORAL | 0 refills | Status: DC | PRN

## 2019-08-10 NOTE — Patient Instructions (Addendum)
Avoid frequent bending and stooping  No lifting greater than 10 lbs. May use ice or moist heat for pain. Weight loss is of benefit.  Hip is suffering from osteoarthritis, only real proven treatments are Weight loss, you should avoid NSIADs like motrin, naprosyn and diclofenac. Exercise. Well padded shoes help. Ice the Hip or heat with moist heat 2-3 times a day 15-20 mins at a time. Discontinue gabapentin and start pregabilin or lyrica Exercise is important to improve your indurance and does allow people to function better inspite of back pain.

## 2019-08-10 NOTE — Progress Notes (Signed)
Post-Op Visit Note   Patient: Marilyn [name]           Date of Birth: 12/11/1970           MRN: [id number] Visit Date: 08/10/2019 PCP: Bridget Hartshorn, NP   Assessment & Plan: 7 weeks post op decompression of right L5 nerve root for persistent foramenal stenosis post L4 to S1 fusion. Still experiencing pain in the right foot  Chief Complaint:  Chief Complaint  Patient presents with  . Lower Back - Routine Post Op   Visit Diagnoses:  1. Fusion of lumbar spine   2. Neuropathic pain   3. Pain in right hip   Awake, alert and oriented x 4, still with compliants of right leg nerve pain. Motor right leg is intact but numbness into the right great toe.  Radiographs done today  Plan: Avoid frequent bending and stooping  No lifting greater than 10 lbs. May use ice or moist heat for pain. Weight loss is of benefit.  Hip is suffering from osteoarthritis, only real proven treatments are Weight loss, you should avoid NSIADs like motrin, naprosyn and diclofenac. Exercise. Well padded shoes help. Ice the Hip or heat with moist heat 2-3 times a day 15-20 [name] at a time. Discontinue gabapentin and start pregabilin or lyrica Exercise is important to improve your indurance and does allow people to function better inspite of back pain.  Follow-Up Instructions: No follow-ups on file.   Orders:  Orders Placed This Encounter  Procedures  . XR Lumbar Spine 2-3 Views  . Ambulatory referral to Physical Medicine Rehab   Meds ordered this encounter  Medications  . HYDROcodone-acetaminophen (NORCO/VICODIN) 5-325 MG tablet    Sig: Take 1 tablet by mouth every 6 (six) hours as needed for moderate pain.    Dispense:  30 tablet    Refill:  0  . pregabalin (LYRICA) 75 MG capsule    Sig: Take 1 capsule (75 mg total) by mouth 2 (two) times daily.    Dispense:  60 capsule    Refill:  3    Imaging: Xr Lumbar Spine 2-3 Views  Result Date: 08/10/2019 AP and lateral flexion and extension  radiographs show the L4 to S1 fusion with pedicle screws and rods in good position and alignment. No abnormality.   PMFS History: Patient Active Problem List   Diagnosis Date Noted  . Anemia due to blood loss, acute 06/18/2018    Priority: High    Class: Acute  . Superficial incisional surgical site infection 06/11/2018    Priority: High    Class: Acute  . Right ankle effusion 06/11/2018    Priority: High    Class: Acute  . Morbid (severe) obesity due to excess calories ([place]) 05/29/2018    Priority: High    Class: Chronic  . Spondylolisthesis, lumbar region 05/27/2018    Priority: High    Class: Chronic  . Spinal stenosis of lumbar region 05/27/2018    Priority: High    Class: Chronic  . Urinary tract infection 05/27/2018    Priority: High    Class: Acute  . Plantar fasciitis of left foot 06/16/2018    Priority: Medium    Class: Chronic  . Tendonitis, Achilles, right 06/16/2018    Priority: Medium    Class: Chronic  . Osteochondral talar dome lesion 06/16/2018    Priority: Medium    Class: Chronic  . Other spondylosis with radiculopathy, lumbar region   . Status post lumbar laminectomy 06/23/2019  .  C. difficile colitis 05/22/2019  . Hypophosphatemia 05/21/2019  . Syncope 05/20/2019  . OSA on CPAP   . Acute gastroenteritis   . Deep incisional surgical site infection   . Fusion of spine of lumbar region 05/27/2018  . Pain of right hip joint 07/03/2017  . Acute right-sided low back pain with right-sided sciatica 03/27/2017  . Chronic right shoulder pain 02/13/2017  . History of rotator cuff tear 11/30/2016  . Impingement syndrome of right shoulder 11/30/2016  . Exacerbation of asthma 07/18/2016  . Hypokalemia 07/18/2016  . Hypertension 07/18/2016  . Depression with anxiety 07/18/2016  . Hypothyroidism 07/18/2016  . Hyperglycemia 07/18/2016  . Acute asthma exacerbation 07/18/2016  . Surgical wound dehiscence left hip; questionable superficial infection 11/10/2015   . Left hip postoperative wound infection 11/10/2015  . Osteoarthritis of left hip 09/09/2015  . 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 Obesity (BMI 35.0-39.9 without comorbidity) 04/28/2013   Past Medical History:  Diagnosis Date  . Anemia    taking iron now. pt states having no current issues 09/02/2015  . Anginal pain (HCC)    pt states experiences chest wall pain pt states related to her asthma   . Anxiety    with MRI's  . Arthritis    Everywhere  . Asthma   . Depression   . Dizziness   . GERD (gastroesophageal reflux disease)   . Headache(784.0)    HX OF MIGRAINES  . History of bronchitis   . Hypertension   . Hypothyroidism    takes levothyroxen  . OSA on CPAP    wears cpap  . Shortness of breath    with exertion  . Wears glasses     Family History  Problem Relation Age of Onset  . Cancer Mother        colon  . Epilepsy Mother   . Cancer Father        prostate  . Diabetes Father   . Hypertension Father   . Hypertension Maternal Aunt   . Diabetes Maternal Aunt   . Hypertension Paternal Aunt     Past Surgical History:  Procedure Laterality Date  . BACK SURGERY    . CESAREAN SECTION     times 2  . CHOLECYSTECTOMY    . ENDOMETRIAL ABLATION    . INCISION AND DRAINAGE HIP Left 11/10/2015   Procedure: IRRIGATION AND DEBRIDEMENT LEFT HIP INCISION;  Surgeon: Kathryne Hitch, MD;  Location: [facility];  Service: Orthopedics;  Laterality: Left;  . JOINT REPLACEMENT  2011   total left knee  . KNEE ARTHROPLASTY  04/23/2012   right   . KNEE ARTHROSCOPY    . LUMBAR LAMINECTOMY/DECOMPRESSION MICRODISCECTOMY N/A 06/23/2019   Procedure: RIGHT LUMBAR FIVE THROUGH SACRAL ONE PARTIAL HEMILAMINECTOMY WITH RIGHT LUMBAR FIVE FORAMINOTOMY;  Surgeon: Kerrin Champagne, MD;  Location: [facility] OR;  Service: Orthopedics;  Laterality: N/A;  . LUMBAR WOUND DEBRIDEMENT N/A 06/11/2018   Procedure: LUMBAR WOUND DEBRIDEMENT;  Surgeon: Kerrin Champagne, MD;  Location: [facility] OR;   Service: Orthopedics;  Laterality: N/A;  . RADIOLOGY WITH ANESTHESIA N/A 09/09/2018   Procedure: LUMBER SPINE WITHOUT CONTRAST;  Surgeon: Radiologist, Medication, MD;  Location: [facility] OR;  Service: Radiology;  Laterality: N/A;  . ROTATOR CUFF REPAIR     left   . SHOULDER SURGERY     right to repair ligament tear  . TOTAL HIP ARTHROPLASTY Left 09/09/2015   Procedure: LEFT TOTAL HIP ARTHROPLASTY ANTERIOR APPROACH;  Surgeon: Kathryne Hitch,  MD;  Location: [facility];  Service: Orthopedics;  Laterality: Left;  . TOTAL KNEE ARTHROPLASTY  04/23/2012   Procedure: TOTAL KNEE ARTHROPLASTY;  Surgeon: Nadara MustardMarcus V Duda, MD;  Location: [facility] OR;  Service: Orthopedics;  Laterality: Right;  Right Total Knee Arthroplasty  . TUBAL LIGATION  1996   Social History   Occupational History  . Not on file  Tobacco Use  . Smoking status: Former Smoker    Packs/day: 0.50    Years: 4.00    Pack years: 2.00    Quit date: 09/18/1991    Years since quitting: 27.9  . Smokeless tobacco: Never Used  Substance and Sexual Activity  . Alcohol use: No  . Drug use: No  . Sexual activity: Yes    Birth control/protection: Surgical

## 2019-08-11 ENCOUNTER — Ambulatory Visit: Payer: Medicare Other | Admitting: *Deleted

## 2019-08-11 ENCOUNTER — Encounter: Payer: Self-pay | Admitting: *Deleted

## 2019-08-11 ENCOUNTER — Other Ambulatory Visit: Payer: Self-pay

## 2019-08-11 DIAGNOSIS — M25572 Pain in left ankle and joints of left foot: Secondary | ICD-10-CM

## 2019-08-11 DIAGNOSIS — M5442 Lumbago with sciatica, left side: Secondary | ICD-10-CM

## 2019-08-11 DIAGNOSIS — R293 Abnormal posture: Secondary | ICD-10-CM

## 2019-08-11 DIAGNOSIS — M5441 Lumbago with sciatica, right side: Secondary | ICD-10-CM

## 2019-08-11 NOTE — Therapy (Signed)
[facility] [address] [place], [place], [address] Phone: [phone]   Fax:  [phone]  Physical Therapy Treatment  Patient Details  Name: Marilyn Redderhelma M [place]field MRN: [id number] Date of Birth: 1970-11-05 Referring Provider (PT): Vira BrownsJames Nitka, MD   Encounter Date: 08/11/2019  PT End of Session - 08/11/19 1512    Visit Number  7    Number of Visits  12    Date for PT Re-Evaluation  09/02/19    Authorization Type  FOTO; PROGRESS NOTE AT 10TH VISIT.  KX MODIFIER AFTER 15 VISITS.    PT Start Time  1415    PT Stop Time  1503    PT Time Calculation (min)  48 min       Past Medical History:  Diagnosis Date  . Anemia    taking iron now. pt states having no current issues 09/02/2015  . Anginal pain (HCC)    pt states experiences chest wall pain pt states related to her asthma   . Anxiety    with MRI's  . Arthritis    Everywhere  . Asthma   . Depression   . Dizziness   . GERD (gastroesophageal reflux disease)   . Headache(784.0)    HX OF MIGRAINES  . History of bronchitis   . Hypertension   . Hypothyroidism    takes levothyroxen  . OSA on CPAP    wears cpap  . Shortness of breath    with exertion  . Wears glasses     Past Surgical History:  Procedure Laterality Date  . BACK SURGERY    . CESAREAN SECTION     times 2  . CHOLECYSTECTOMY    . ENDOMETRIAL ABLATION    . INCISION AND DRAINAGE HIP Left 11/10/2015   Procedure: IRRIGATION AND DEBRIDEMENT LEFT HIP INCISION;  Surgeon: Kathryne Hitchhristopher Y Blackman, MD;  Location: [facility];  Service: Orthopedics;  Laterality: Left;  . JOINT REPLACEMENT  2011   total left knee  . KNEE ARTHROPLASTY  04/23/2012   right   . KNEE ARTHROSCOPY    . LUMBAR LAMINECTOMY/DECOMPRESSION MICRODISCECTOMY N/A 06/23/2019   Procedure: RIGHT LUMBAR FIVE THROUGH SACRAL ONE PARTIAL HEMILAMINECTOMY WITH RIGHT LUMBAR FIVE FORAMINOTOMY;  Surgeon: Kerrin ChampagneNitka, James E, MD;  Location: [facility] OR;  Service: Orthopedics;   Laterality: N/A;  . LUMBAR WOUND DEBRIDEMENT N/A 06/11/2018   Procedure: LUMBAR WOUND DEBRIDEMENT;  Surgeon: Kerrin ChampagneNitka, James E, MD;  [facility];  Service: Orthopedics;  Laterality: N/A;  . RADIOLOGY WITH ANESTHESIA N/A 09/09/2018   Procedure: LUMBER SPINE WITHOUT CONTRAST;  Surgeon: Radiologist, Medication, MD;  Location: [facility] OR;  Service: Radiology;  Laterality: N/A;  . ROTATOR CUFF REPAIR     left   . SHOULDER SURGERY     right to repair ligament tear  . TOTAL HIP ARTHROPLASTY Left 09/09/2015   Procedure: LEFT TOTAL HIP ARTHROPLASTY ANTERIOR APPROACH;  Surgeon: Kathryne Hitchhristopher Y Blackman, MD;  Location: [facility];  Service: Orthopedics;  Laterality: Left;  . TOTAL KNEE ARTHROPLASTY  04/23/2012   Procedure: TOTAL KNEE ARTHROPLASTY;  Surgeon: Nadara MustardMarcus V Duda, MD;  Location: [facility] OR;  Service: Orthopedics;  Laterality: Right;  Right Total Knee Arthroplasty  . TUBAL LIGATION  1996    There were no vitals filed for this visit.  Subjective Assessment - 08/11/19 1502    Subjective  COVID-19 screen performed prior to patient entering clinic. Went to see Dr Otelia SergeantNitka yesterday. He is ordering a hip injection due to RT LE pain continues to be  a problem. Trying to rule out the hip.    Pertinent History  Fusion 05/2019, L5-S1 decompression 06/2019, RTC surgery, lumbar surgery, hip surgery.    How long can you walk comfortably?  comfortably around the home, needs UE support for community ambulation    Diagnostic tests  x-ray & MRI    Patient Stated Goals  Get out of pain.    Currently in Pain?  Yes    Pain Score  5     Pain Location  Leg    Pain Orientation  Right    Pain Radiating Towards  RT hip    Pain Onset  1 to 4 weeks ago    Pain Frequency  Constant                       [facility] Adult PT Treatment/Exercise - 08/11/19 0001      Exercises   Exercises  Knee/Hip      Lumbar Exercises: Aerobic   Nustep  Level (LE's only) x 15 minutes. L5      Modalities   Modalities  Electrical  Stimulation;Moist Heat      Moist Heat Therapy   Number Minutes Moist Heat  --   unavailable     Electrical Stimulation   Electrical Stimulation Location  Bilateral low back.    Electrical Stimulation Action  IFC x 15 mins 80-15hz      Electrical Stimulation Parameters  seated    Electrical Stimulation Goals  Pain      Manual Therapy   Manual Therapy  Soft tissue mobilization    Manual therapy comments  seated    Soft tissue mobilization  STW/M to affected lumbar region Bil LB paras and scar mobs                  PT Long Term Goals - 07/15/19 2022      PT LONG TERM GOAL #1   Title  Patient will be independent with HEP    Time  6    Period  Weeks    Status  New      PT LONG TERM GOAL #2   Title  Patient will report ability to walk community distance with UE support with low back pain less than or equal to 3/10.    Time  6    Period  Weeks    Status  New      PT LONG TERM GOAL #3   Title  Patient will demonstrate proper bed mobilty to protect spine during transfers.    Time  6    Period  Weeks    Status  New      PT LONG TERM GOAL #4   Title  Patient will report ability to perform ADLs independently with low back pain less than or equal to 3/10.    Time  6    Period  Weeks    Status  New      PT LONG TERM GOAL #5   Title  --            Plan - 08/11/19 1513    Clinical Impression Statement  Pt arrived today doing fairly well and reports that her LBP has decreased since starting PT, but her RT LE pain is about the same or even worse at times. She was able to perform therex today and LE pain remained about the same. She did well with STW and normal modality response  Examination-Activity Limitations  Locomotion Level;Bed Mobility;Bend;Hygiene/Grooming;Stand;Squat;Sleep    Stability/Clinical Decision Making  Stable/Uncomplicated    Rehab Potential  Good    PT Frequency  2x / week    PT Duration  6 weeks    PT Treatment/Interventions   Cryotherapy;Electrical Stimulation;Ultrasound;Therapeutic activities;Therapeutic exercise;Manual techniques;Patient/family education;Passive range of motion;Joint Manipulations    PT Next Visit Plan  Nustep, core strengthening, body mechanics, scar mobilization once completely healed, modalities PRN for pain relief.    PT Home Exercise Plan  see patient education section    Consulted and Agree with Plan of Care  Patient       Patient will benefit from skilled therapeutic intervention in order to improve the following deficits and impairments:  Pain, Decreased activity tolerance, Difficulty walking, Decreased scar mobility, Postural dysfunction, Obesity  Visit Diagnosis: Abnormal posture  Acute bilateral low back pain with bilateral sciatica  Pain in left ankle and joints of left foot  Acute right-sided low back pain with right-sided sciatica     Problem List Patient Active Problem List   Diagnosis Date Noted  . Other spondylosis with radiculopathy, lumbar region   . Status post lumbar laminectomy 06/23/2019  . C. difficile colitis 05/22/2019  . Hypophosphatemia 05/21/2019  . Syncope 05/20/2019  . OSA on CPAP   . Acute gastroenteritis   . Anemia due to blood loss, acute 06/18/2018    Class: Acute  . Plantar fasciitis of left foot 06/16/2018    Class: Chronic  . Tendonitis, Achilles, right 06/16/2018    Class: Chronic  . Osteochondral talar dome lesion 06/16/2018    Class: Chronic  . Deep incisional surgical site infection   . Superficial incisional surgical site infection 06/11/2018    Class: Acute  . Right ankle effusion 06/11/2018    Class: Acute  . Morbid (severe) obesity due to excess calories ([place]) 05/29/2018    Class: Chronic  . Spondylolisthesis, lumbar region 05/27/2018    Class: Chronic  . Spinal stenosis of lumbar region 05/27/2018    Class: Chronic  . Urinary tract infection 05/27/2018    Class: Acute  . Fusion of spine of lumbar region 05/27/2018  .  Pain of right hip joint 07/03/2017  . Acute right-sided low back pain with right-sided sciatica 03/27/2017  . Chronic right shoulder pain 02/13/2017  . History of rotator cuff tear 11/30/2016  . Impingement syndrome of right shoulder 11/30/2016  . Exacerbation of asthma 07/18/2016  . Hypokalemia 07/18/2016  . Hypertension 07/18/2016  . Depression with anxiety 07/18/2016  . Hypothyroidism 07/18/2016  . Hyperglycemia 07/18/2016  . Acute asthma exacerbation 07/18/2016  . Surgical wound dehiscence left hip; questionable superficial infection 11/10/2015  . Left hip postoperative wound infection 11/10/2015  . Osteoarthritis of left hip 09/09/2015  . 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 Obesity (BMI 35.0-39.9 without comorbidity) 04/28/2013    ,CHRIS,PTA 08/11/2019, 4:40 PM  [facility] [place], [name], [address] Phone: [phone]   Fax:  [phone]  Name: Marilyn [name] MRN: [id number] Date of Birth: 09-Nov-1970

## 2019-08-18 ENCOUNTER — Ambulatory Visit: Payer: Medicare Other | Attending: Specialist | Admitting: *Deleted

## 2019-08-18 ENCOUNTER — Other Ambulatory Visit: Payer: Self-pay

## 2019-08-18 DIAGNOSIS — R293 Abnormal posture: Secondary | ICD-10-CM

## 2019-08-18 DIAGNOSIS — M25572 Pain in left ankle and joints of left foot: Secondary | ICD-10-CM | POA: Diagnosis present

## 2019-08-18 DIAGNOSIS — M5442 Lumbago with sciatica, left side: Secondary | ICD-10-CM | POA: Diagnosis present

## 2019-08-18 DIAGNOSIS — M5441 Lumbago with sciatica, right side: Secondary | ICD-10-CM | POA: Diagnosis present

## 2019-08-18 NOTE — Therapy (Signed)
[facility] [address] [place], [place], [address] Phone: [phone]   Fax:  [phone]  Physical Therapy Treatment  Patient Details  Name: Marilyn [name] MRN: [id number] Date of Birth: 12-10-1970 Referring Provider (PT): Vira Browns, MD   Encounter Date: 08/18/2019  PT End of Session - 08/18/19 1522    Visit Number  8    Number of Visits  12    Date for PT Re-Evaluation  09/02/19    Authorization Type  FOTO; PROGRESS NOTE AT 10TH VISIT.  KX MODIFIER AFTER 15 VISITS.    PT Start Time  1347    PT Stop Time  1435    PT Time Calculation (min)  48 min       Past Medical History:  Diagnosis Date  . Anemia    taking iron now. pt states having no current issues 09/02/2015  . Anginal pain (HCC)    pt states experiences chest wall pain pt states related to her asthma   . Anxiety    with MRI's  . Arthritis    Everywhere  . Asthma   . Depression   . Dizziness   . GERD (gastroesophageal reflux disease)   . Headache(784.0)    HX OF MIGRAINES  . History of bronchitis   . Hypertension   . Hypothyroidism    takes levothyroxen  . OSA on CPAP    wears cpap  . Shortness of breath    with exertion  . Wears glasses     Past Surgical History:  Procedure Laterality Date  . BACK SURGERY    . CESAREAN SECTION     times 2  . CHOLECYSTECTOMY    . ENDOMETRIAL ABLATION    . INCISION AND DRAINAGE HIP Left 11/10/2015   Procedure: IRRIGATION AND DEBRIDEMENT LEFT HIP INCISION;  Surgeon: Kathryne Hitch, MD;  Location: [facility] OR;  Service: Orthopedics;  Laterality: Left;  . JOINT REPLACEMENT  2011   total left knee  . KNEE ARTHROPLASTY  04/23/2012   right   . KNEE ARTHROSCOPY    . LUMBAR LAMINECTOMY/DECOMPRESSION MICRODISCECTOMY N/A 06/23/2019   Procedure: RIGHT LUMBAR FIVE THROUGH SACRAL ONE PARTIAL HEMILAMINECTOMY WITH RIGHT LUMBAR FIVE FORAMINOTOMY;  Surgeon: Kerrin Champagne, MD;  Location: [facility] OR;  Service: Orthopedics;   Laterality: N/A;  . LUMBAR WOUND DEBRIDEMENT N/A 06/11/2018   Procedure: LUMBAR WOUND DEBRIDEMENT;  Surgeon: Kerrin Champagne, MD;  Location: [facility] OR;  Service: Orthopedics;  Laterality: N/A;  . RADIOLOGY WITH ANESTHESIA N/A 09/09/2018   Procedure: LUMBER SPINE WITHOUT CONTRAST;  Surgeon: Radiologist, Medication, MD;  Location: [facility] OR;  Service: Radiology;  Laterality: N/A;  . ROTATOR CUFF REPAIR     left   . SHOULDER SURGERY     right to repair ligament tear  . TOTAL HIP ARTHROPLASTY Left 09/09/2015   Procedure: LEFT TOTAL HIP ARTHROPLASTY ANTERIOR APPROACH;  Surgeon: Kathryne Hitch, MD;  Location: [facility];  Service: Orthopedics;  Laterality: Left;  . TOTAL KNEE ARTHROPLASTY  04/23/2012   Procedure: TOTAL KNEE ARTHROPLASTY;  Surgeon: [name], MD;  Location: [facility];  Service: Orthopedics;  Laterality: Right;  Right Total Knee Arthroplasty  . TUBAL LIGATION  1996    There were no vitals filed for this visit.                    [facility] Adult PT Treatment/Exercise - 08/18/19 0001      Exercises   Exercises  Knee/Hip  Lumbar Exercises: Aerobic   Nustep  Level (LE's only) x 15 minutes. L5      Modalities   Modalities  Electrical Stimulation;Moist Heat      Moist Heat Therapy   Number Minutes Moist Heat  15 Minutes    Moist Heat Location  Lumbar Spine      Electrical Stimulation   Electrical Stimulation Location  Bilateral low back. IFC 80-150hz  x 15 mins    Electrical Stimulation Parameters  seated    Electrical Stimulation Goals  Pain      Manual Therapy   Manual Therapy  Soft tissue mobilization    Manual therapy comments  seated    Soft tissue mobilization  STW/M to affected lumbar region Bil LB paras and scar mobs                  PT Long Term Goals - 07/15/19 2022      PT LONG TERM GOAL #1   Title  Patient will be independent with HEP    Time  6    Period  Weeks    Status  New      PT LONG TERM GOAL #2   Title  Patient will  report ability to walk community distance with UE support with low back pain less than or equal to 3/10.    Time  6    Period  Weeks    Status  New      PT LONG TERM GOAL #3   Title  Patient will demonstrate proper bed mobilty to protect spine during transfers.    Time  6    Period  Weeks    Status  New      PT LONG TERM GOAL #4   Title  Patient will report ability to perform ADLs independently with low back pain less than or equal to 3/10.    Time  6    Period  Weeks    Status  New      PT LONG TERM GOAL #5   Title  --            Plan - 08/18/19 1524    Clinical Impression Statement  Pt arrived today doing fair, but reports having pain in both LEs. RT hip and down the leg and in LT mid-thigh and down. She Is  having an injection in RT hip to assess pain from LB or hip. She did well with Rx today with some relief after Rx    Comorbidities  hemilaminectomy L5-S1 06/23/2019, RTC surgery, lumbar surgery, hip surgery.    Examination-Activity Limitations  Locomotion Level;Bed Mobility;Bend;Hygiene/Grooming;Stand;Squat;Sleep    Examination-Participation Restrictions  Cleaning;Shop    Stability/Clinical Decision Making  Stable/Uncomplicated    Rehab Potential  Good    PT Frequency  2x / week    PT Duration  6 weeks    PT Treatment/Interventions  Cryotherapy;Electrical Stimulation;Ultrasound;Therapeutic activities;Therapeutic exercise;Manual techniques;Patient/family education;Passive range of motion;Joint Manipulations    PT Next Visit Plan  Nustep, core strengthening, body mechanics, scar mobilization once completely healed, modalities PRN for pain relief.    PT Home Exercise Plan  see patient education section       Patient will benefit from skilled therapeutic intervention in order to improve the following deficits and impairments:  Pain, Decreased activity tolerance, Difficulty walking, Decreased scar mobility, Postural dysfunction, Obesity  Visit Diagnosis: Abnormal  posture  Acute bilateral low back pain with bilateral sciatica  Pain in left ankle and joints of left  foot  Acute right-sided low back pain with right-sided sciatica     Problem List Patient Active Problem List   Diagnosis Date Noted  . Other spondylosis with radiculopathy, lumbar region   . Status post lumbar laminectomy 06/23/2019  . C. difficile colitis 05/22/2019  . Hypophosphatemia 05/21/2019  . Syncope 05/20/2019  . OSA on CPAP   . Acute gastroenteritis   . Anemia due to blood loss, acute 06/18/2018    Class: Acute  . Plantar fasciitis of left foot 06/16/2018    Class: Chronic  . Tendonitis, Achilles, right 06/16/2018    Class: Chronic  . Osteochondral talar dome lesion 06/16/2018    Class: Chronic  . Deep incisional surgical site infection   . Superficial incisional surgical site infection 06/11/2018    Class: Acute  . Right ankle effusion 06/11/2018    Class: Acute  . Morbid (severe) obesity due to excess calories (HCC) 05/29/2018    Class: Chronic  . Spondylolisthesis, lumbar region 05/27/2018    Class: Chronic  . Spinal stenosis of lumbar region 05/27/2018    Class: Chronic  . Urinary tract infection 05/27/2018    Class: Acute  . Fusion of spine of lumbar region 05/27/2018  . Pain of right hip joint 07/03/2017  . Acute right-sided low back pain with right-sided sciatica 03/27/2017  . Chronic right shoulder pain 02/13/2017  . History of rotator cuff tear 11/30/2016  . Impingement syndrome of right shoulder 11/30/2016  . Exacerbation of asthma 07/18/2016  . Hypokalemia 07/18/2016  . Hypertension 07/18/2016  . Depression with anxiety 07/18/2016  . Hypothyroidism 07/18/2016  . Hyperglycemia 07/18/2016  . Acute asthma exacerbation 07/18/2016  . Surgical wound dehiscence left hip; questionable superficial infection 11/10/2015  . Left hip postoperative wound infection 11/10/2015  . Osteoarthritis of left hip 09/09/2015  . Status post total replacement of  left hip 09/09/2015  . Obesity (BMI 35.0-39.9 without comorbidity) 04/28/2013    Lanijah Warzecha,CHRIS, PTA 08/18/2019, 5:42 PM  [facility] [address] [place], [place], [address] Phone: [phone]   Fax:  [phone]  Name: Marilyn Redderhelma M [name] MRN: [id number] Date of Birth: 07/12/1971

## 2019-08-20 ENCOUNTER — Other Ambulatory Visit: Payer: Self-pay

## 2019-08-20 ENCOUNTER — Ambulatory Visit: Payer: Medicare Other | Admitting: Physical Therapy

## 2019-08-20 ENCOUNTER — Encounter: Payer: Self-pay | Admitting: Physical Therapy

## 2019-08-20 ENCOUNTER — Other Ambulatory Visit: Payer: Self-pay | Admitting: Specialist

## 2019-08-20 DIAGNOSIS — R293 Abnormal posture: Secondary | ICD-10-CM

## 2019-08-20 DIAGNOSIS — M5441 Lumbago with sciatica, right side: Secondary | ICD-10-CM

## 2019-08-20 DIAGNOSIS — M5442 Lumbago with sciatica, left side: Secondary | ICD-10-CM

## 2019-08-20 NOTE — Therapy (Signed)
[facility] [place], [name], [address] Phone: [phone]   Fax:  [phone]  Physical Therapy Treatment  Patient Details  Name: Marilyn [name] MRN: [id number] Date of Birth: 1970/11/17 Referring Provider (PT): Basil Dess, MD   Encounter Date: 08/20/2019  PT End of Session - 08/20/19 1429    Visit Number  9    Number of Visits  12    Authorization Type  FOTO; PROGRESS NOTE AT 10TH VISIT.  KX MODIFIER AFTER 15 VISITS.    PT Start Time  0145    PT Stop Time  0236    PT Time Calculation (min)  51 min    Activity Tolerance  Patient tolerated treatment well;Patient limited by pain    Behavior During Therapy  [facility] for tasks assessed/performed       Past Medical History:  Diagnosis Date  . Anemia    taking iron now. pt states having no current issues 09/02/2015  . Anginal pain ([place])    pt states experiences chest wall pain pt states related to her asthma   . Anxiety    with MRI's  . Arthritis    Everywhere  . Asthma   . Depression   . Dizziness   . GERD (gastroesophageal reflux disease)   . Headache(784.0)    HX OF MIGRAINES  . History of bronchitis   . Hypertension   . Hypothyroidism    takes levothyroxen  . OSA on CPAP    wears cpap  . Shortness of breath    with exertion  . Wears glasses     Past Surgical History:  Procedure Laterality Date  . BACK SURGERY    . CESAREAN SECTION     times 2  . CHOLECYSTECTOMY    . ENDOMETRIAL ABLATION    . INCISION AND DRAINAGE HIP Left 11/10/2015   Procedure: IRRIGATION AND DEBRIDEMENT LEFT HIP INCISION;  Surgeon: Mcarthur Rossetti, MD;  Location: [place];  Service: Orthopedics;  Laterality: Left;  . JOINT REPLACEMENT  2011   total left knee  . KNEE ARTHROPLASTY  04/23/2012   right   . KNEE ARTHROSCOPY    . LUMBAR LAMINECTOMY/DECOMPRESSION MICRODISCECTOMY N/A 06/23/2019   Procedure: RIGHT LUMBAR FIVE THROUGH SACRAL ONE PARTIAL HEMILAMINECTOMY WITH RIGHT  LUMBAR FIVE FORAMINOTOMY;  Surgeon: Jessy Oto, MD;  Location: [place];  Service: Orthopedics;  Laterality: N/A;  . LUMBAR WOUND DEBRIDEMENT N/A 06/11/2018   Procedure: LUMBAR WOUND DEBRIDEMENT;  Surgeon: Jessy Oto, MD;  Location: [place];  Service: Orthopedics;  Laterality: N/A;  . RADIOLOGY WITH ANESTHESIA N/A 09/09/2018   Procedure: LUMBER SPINE WITHOUT CONTRAST;  Surgeon: Radiologist, Medication, MD;  Location: [place];  Service: Radiology;  Laterality: N/A;  . ROTATOR CUFF REPAIR     left   . SHOULDER SURGERY     right to repair ligament tear  . TOTAL HIP ARTHROPLASTY Left 09/09/2015   Procedure: LEFT TOTAL HIP ARTHROPLASTY ANTERIOR APPROACH;  Surgeon: Mcarthur Rossetti, MD;  Location: [facility];  Service: Orthopedics;  Laterality: Left;  . TOTAL KNEE ARTHROPLASTY  04/23/2012   Procedure: TOTAL KNEE ARTHROPLASTY;  Surgeon: Newt Minion, MD;  Location: [place];  Service: Orthopedics;  Laterality: Right;  Right Total Knee Arthroplasty  . TUBAL LIGATION  1996    There were no vitals filed for this visit.  Subjective Assessment - 08/20/19 1350    Subjective  COVID-19 screen performed prior to patient entering clinic. Patient arrived with ongoing discomfort  and did well after last treatment    Pertinent History  Fusion 05/2019, L5-S1 decompression 06/2019, RTC surgery, lumbar surgery, hip surgery.    How long can you walk comfortably?  comfortably around the home, needs UE support for community ambulation    Diagnostic tests  x-ray & MRI    Patient Stated Goals  Get out of pain.    Currently in Pain?  Yes    Pain Score  3     Pain Location  Leg    Pain Orientation  Right    Pain Descriptors / Indicators  Aching;Sore    Pain Onset  More than a month ago    Pain Frequency  Constant    Aggravating Factors   being up and movement all day    Pain Relieving Factors  rest and meds                       [facility] Adult PT Treatment/Exercise - 08/20/19 0001      Lumbar  Exercises: Aerobic   Nustep  Level (LE's only) x 15 minutes. L5      Moist Heat Therapy   Number Minutes Moist Heat  15 Minutes    Moist Heat Location  Lumbar Spine      Electrical Stimulation   Electrical Stimulation Location  Bilateral low back. IFC 80-150hz  x 15 mins    Electrical Stimulation Parameters  seated    Electrical Stimulation Goals  Pain      Manual Therapy   Manual Therapy  Soft tissue mobilization    Manual therapy comments  seated    Soft tissue mobilization  STW/M to  Bil LB paras and scar mobs to reduce pain, tone and scar tissue                  PT Long Term Goals - 08/20/19 1431      PT LONG TERM GOAL #1   Title  Patient will be independent with HEP    Time  6    Period  Weeks    Status  On-going      PT LONG TERM GOAL #2   Title  Patient will report ability to walk community distance with UE support with low back pain less than or equal to 3/10.    Time  6    Period  Weeks    Status  On-going   pain over 3/10 with prolong acyivity and walking 08/20/19     PT LONG TERM GOAL #3   Title  Patient will demonstrate proper bed mobilty to protect spine during transfers.    Period  Weeks    Status  On-going      PT LONG TERM GOAL #4   Title  Patient will report ability to perform ADLs independently with low back pain less than or equal to 3/10.    Time  6    Period  Weeks    Status  On-going   pain over a 3/10 with ADL's at this time 08/20/19           Plan - 08/20/19 1433    Clinical Impression Statement  Patient tolerated treatment fair today. Patient able to perfom nustep with core focus yet unable to tolerate prolong exercises/ standing activity. Patient has responded well to treatments with some relief yet only temporary relief. Patient has palpable pain bil low back and only able to tolerate moderate pressure with manual STW. Patient has 3-4/10  pain at rest and pain increase with any ADL's, prolong standing and movement. Current goals  ongoing.    Personal Factors and Comorbidities  Comorbidity 1    Comorbidities  hemilaminectomy L5-S1 06/23/2019, RTC surgery, lumbar surgery, hip surgery.    Examination-Activity Limitations  Locomotion Level;Bed Mobility;Bend;Hygiene/Grooming;Stand;Squat;Sleep    Examination-Participation Restrictions  Cleaning;Shop    Stability/Clinical Decision Making  Stable/Uncomplicated    Rehab Potential  Good    PT Frequency  2x / week    PT Duration  6 weeks    PT Treatment/Interventions  Cryotherapy;Electrical Stimulation;Ultrasound;Therapeutic activities;Therapeutic exercise;Manual techniques;Patient/family education;Passive range of motion;Joint Manipulations    PT Next Visit Plan  cont with Nustep, core strengthening, body mechanics, scar mobilization once completely healed, modalities PRN for pain relief.    Consulted and Agree with Plan of Care  Patient       Patient will benefit from skilled therapeutic intervention in order to improve the following deficits and impairments:  Pain, Decreased activity tolerance, Difficulty walking, Decreased scar mobility, Postural dysfunction, Obesity  Visit Diagnosis: Abnormal posture  Acute bilateral low back pain with bilateral sciatica     Problem List Patient Active Problem List   Diagnosis Date Noted  . Other spondylosis with radiculopathy, lumbar region   . Status post lumbar laminectomy 06/23/2019  . C. difficile colitis 05/22/2019  . Hypophosphatemia 05/21/2019  . Syncope 05/20/2019  . OSA on CPAP   . Acute gastroenteritis   . Anemia due to blood loss, acute 06/18/2018    Class: Acute  . Plantar fasciitis of left foot 06/16/2018    Class: Chronic  . Tendonitis, Achilles, right 06/16/2018    Class: Chronic  . Osteochondral talar dome lesion 06/16/2018    Class: Chronic  . Deep incisional surgical site infection   . Superficial incisional surgical site infection 06/11/2018    Class: Acute  . Right ankle effusion 06/11/2018     Class: Acute  . Morbid (severe) obesity due to excess calories ([facility]) 05/29/2018    Class: Chronic  . Spondylolisthesis, lumbar region 05/27/2018    Class: Chronic  . Spinal stenosis of lumbar region 05/27/2018    Class: Chronic  . Urinary tract infection 05/27/2018    Class: Acute  . Fusion of spine of lumbar region 05/27/2018  . Pain of right hip joint 07/03/2017  . Acute right-sided low back pain with right-sided sciatica 03/27/2017  . Chronic right shoulder pain 02/13/2017  . History of rotator cuff tear 11/30/2016  . Impingement syndrome of right shoulder 11/30/2016  . Exacerbation of asthma 07/18/2016  . Hypokalemia 07/18/2016  . Hypertension 07/18/2016  . Depression with anxiety 07/18/2016  . Hypothyroidism 07/18/2016  . Hyperglycemia 07/18/2016  . Acute asthma exacerbation 07/18/2016  . Surgical wound dehiscence left hip; questionable superficial infection 11/10/2015  . Left hip postoperative wound infection 11/10/2015  . Osteoarthritis of left hip 09/09/2015  . 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 Obesity (BMI 35.0-39.9 without comorbidity) 04/28/2013    Kallyn Demarcus P, PTA 08/20/2019, 2:38 PM  [facility] [address] [place], [place], [address] Phone: [phone]   Fax:  [phone]  Name: ESTHELA BRANDNER MRN: [id number] Date of Birth: 1970-10-15

## 2019-08-25 ENCOUNTER — Ambulatory Visit: Payer: Medicare Other | Admitting: *Deleted

## 2019-08-27 ENCOUNTER — Other Ambulatory Visit: Payer: Self-pay

## 2019-08-27 ENCOUNTER — Encounter: Payer: Self-pay | Admitting: *Deleted

## 2019-08-27 ENCOUNTER — Ambulatory Visit: Payer: Medicare Other | Admitting: *Deleted

## 2019-08-27 DIAGNOSIS — M5442 Lumbago with sciatica, left side: Secondary | ICD-10-CM

## 2019-08-27 DIAGNOSIS — R293 Abnormal posture: Secondary | ICD-10-CM | POA: Diagnosis not present

## 2019-08-27 DIAGNOSIS — M25572 Pain in left ankle and joints of left foot: Secondary | ICD-10-CM

## 2019-08-27 DIAGNOSIS — M5441 Lumbago with sciatica, right side: Secondary | ICD-10-CM

## 2019-08-27 NOTE — Therapy (Signed)
[facility] [address] [place], [place], [address] Phone: [phone]   Fax:  [phone]  Physical Therapy Treatment  Patient Details  Name: Marilyn [name] MRN: [id number] Date of Birth: 1971-08-18 Referring Provider (PT): Vira BrownsJames Nitka, MD   Encounter Date: 08/27/2019  PT End of Session - 08/27/19 1408    Visit Number  10    Number of Visits  12    Date for PT Re-Evaluation  09/02/19    Authorization Type  FOTO; PROGRESS NOTE AT 10TH VISIT.  KX MODIFIER AFTER 15 VISITS.    PT Start Time  1347    PT Stop Time  1437    PT Time Calculation (min)  50 min       Past Medical History:  Diagnosis Date  . Anemia    taking iron now. pt states having no current issues 09/02/2015  . Anginal pain ([facility])    pt states experiences chest wall pain pt states related to her asthma   . Anxiety    with MRI's  . Arthritis    Everywhere  . Asthma   . Depression   . Dizziness   . GERD (gastroesophageal reflux disease)   . Headache(784.0)    HX OF MIGRAINES  . History of bronchitis   . Hypertension   . Hypothyroidism    takes levothyroxen  . OSA on CPAP    wears cpap  . Shortness of breath    with exertion  . Wears glasses     Past Surgical History:  Procedure Laterality Date  . BACK SURGERY    . CESAREAN SECTION     times 2  . CHOLECYSTECTOMY    . ENDOMETRIAL ABLATION    . INCISION AND DRAINAGE HIP Left 11/10/2015   Procedure: IRRIGATION AND DEBRIDEMENT LEFT HIP INCISION;  Surgeon: Kathryne Hitchhristopher Y Blackman, MD;  [facility];  Service: Orthopedics;  Laterality: Left;  . JOINT REPLACEMENT  2011   total left knee  . KNEE ARTHROPLASTY  04/23/2012   right   . KNEE ARTHROSCOPY    . LUMBAR LAMINECTOMY/DECOMPRESSION MICRODISCECTOMY N/A 06/23/2019   Procedure: RIGHT LUMBAR FIVE THROUGH SACRAL ONE PARTIAL HEMILAMINECTOMY WITH RIGHT LUMBAR FIVE FORAMINOTOMY;  Surgeon: Kerrin ChampagneNitka, James E, MD;  Location[facility];  Service: Orthopedics;   Laterality: N/A;  . LUMBAR WOUND DEBRIDEMENT N/A 06/11/2018   Procedure: LUMBAR WOUND DEBRIDEMENT;  Surgeon: Kerrin ChampagneNitka, James E, MD;  [facility];  Service: Orthopedics;  Laterality: N/A;  . RADIOLOGY WITH ANESTHESIA N/A 09/09/2018   Procedure: LUMBER SPINE WITHOUT CONTRAST;  Surgeon: Radiologist, Medication, MD;  [facility];  Service: Radiology;  Laterality: N/A;  . ROTATOR CUFF REPAIR     left   . SHOULDER SURGERY     right to repair ligament tear  . TOTAL HIP ARTHROPLASTY Left 09/09/2015   Procedure: LEFT TOTAL HIP ARTHROPLASTY ANTERIOR APPROACH;  Surgeon: Kathryne Hitchhristopher Y Blackman, MD;  Location: [facility];  Service: Orthopedics;  Laterality: Left;  . TOTAL KNEE ARTHROPLASTY  04/23/2012   Procedure: TOTAL KNEE ARTHROPLASTY;  Surgeon: Nadara MustardMarcus V Duda, MD;  Location[facility];  Service: Orthopedics;  Laterality: Right;  Right Total Knee Arthroplasty  . TUBAL LIGATION  1996    There were no vitals filed for this visit.  Subjective Assessment - 08/27/19 1353    Subjective  COVID-19 screen performed prior to patient entering clinic. Patient arrived with ongoing discomfort and did well after last treatment    Pertinent History  Fusion 05/2019, L5-S1  decompression 06/2019, RTC surgery, lumbar surgery, hip surgery.    How long can you walk comfortably?  comfortably around the home, needs UE support for community ambulation    Diagnostic tests  x-ray & MRI    Patient Stated Goals  Get out of pain.                       [facility] Adult PT Treatment/Exercise - 08/27/19 0001      Exercises   Exercises  Knee/Hip      Lumbar Exercises: Aerobic   Nustep  Level (LE's only) x 15 minutes. L5      Modalities   Modalities  Electrical Stimulation;Moist Heat      Moist Heat Therapy   Number Minutes Moist Heat  15 Minutes    Moist Heat Location  Lumbar Spine      Electrical Stimulation   Electrical Stimulation Location  Bilateral low back. IFC 80-150hz  x 15 mins    Electrical Stimulation  Parameters  seated    Electrical Stimulation Goals  Pain      Manual Therapy   Manual Therapy  Soft tissue mobilization    Manual therapy comments  seated    Soft tissue mobilization  STW/M with medium pressure to  Bil LB paras and scar mobs to reduce pain, tone and scar tissue                  PT Long Term Goals - 08/20/19 1431      PT LONG TERM GOAL #1   Title  Patient will be independent with HEP    Time  6    Period  Weeks    Status  On-going      PT LONG TERM GOAL #2   Title  Patient will report ability to walk community distance with UE support with low back pain less than or equal to 3/10.    Time  6    Period  Weeks    Status  On-going   pain over 3/10 with prolong acyivity and walking 08/20/19     PT LONG TERM GOAL #3   Title  Patient will demonstrate proper bed mobilty to protect spine during transfers.    Period  Weeks    Status  On-going      PT LONG TERM GOAL #4   Title  Patient will report ability to perform ADLs independently with low back pain less than or equal to 3/10.    Time  6    Period  Weeks    Status  On-going   pain over a 3/10 with ADL's at this time 08/20/19           Plan - 08/27/19 1520    Clinical Impression Statement  Pt arrived today doing better with less pain 3-4/10 in LB and RT leg. Pt was able to complete therex without c/o increased pain today f/b  STW moderate pressure with notable tenderness on RT LB paras. FOTO taken with improvement to 57% limitation. Normal modality response today    Personal Factors and Comorbidities  Comorbidity 1    Comorbidities  hemilaminectomy L5-S1 06/23/2019, RTC surgery, lumbar surgery, hip surgery.    Examination-Activity Limitations  Locomotion Level;Bed Mobility;Bend;Hygiene/Grooming;Stand;Squat;Sleep    Examination-Participation Restrictions  Cleaning;Shop    Stability/Clinical Decision Making  Stable/Uncomplicated    Rehab Potential  Good    PT Frequency  2x / week    PT Duration  6  weeks  PT Treatment/Interventions  Cryotherapy;Electrical Stimulation;Ultrasound;Therapeutic activities;Therapeutic exercise;Manual techniques;Patient/family education;Passive range of motion;Joint Manipulations    PT Next Visit Plan  cont with Nustep, core strengthening, body mechanics, scar mobilization once completely healed, modalities PRN for pain relief.    PT Home Exercise Plan  see patient education section       Patient will benefit from skilled therapeutic intervention in order to improve the following deficits and impairments:  Pain, Decreased activity tolerance, Difficulty walking, Decreased scar mobility, Postural dysfunction, Obesity  Visit Diagnosis: Abnormal posture  Acute bilateral low back pain with bilateral sciatica  Pain in left ankle and joints of left foot  Acute right-sided low back pain with right-sided sciatica     Problem List Patient Active Problem List   Diagnosis Date Noted  . Other spondylosis with radiculopathy, lumbar region   . Status post lumbar laminectomy 06/23/2019  . C. difficile colitis 05/22/2019  . Hypophosphatemia 05/21/2019  . Syncope 05/20/2019  . OSA on CPAP   . Acute gastroenteritis   . Anemia due to blood loss, acute 06/18/2018    Class: Acute  . Plantar fasciitis of left foot 06/16/2018    Class: Chronic  . Tendonitis, Achilles, right 06/16/2018    Class: Chronic  . Osteochondral talar dome lesion 06/16/2018    Class: Chronic  . Deep incisional surgical site infection   . Superficial incisional surgical site infection 06/11/2018    Class: Acute  . Right ankle effusion 06/11/2018    Class: Acute  . Morbid (severe) obesity due to excess calories ([place]) 05/29/2018    Class: Chronic  . Spondylolisthesis, lumbar region 05/27/2018    Class: Chronic  . Spinal stenosis of lumbar region 05/27/2018    Class: Chronic  . Urinary tract infection 05/27/2018    Class: Acute  . Fusion of spine of lumbar region 05/27/2018  . Pain  of right hip joint 07/03/2017  . Acute right-sided low back pain with right-sided sciatica 03/27/2017  . Chronic right shoulder pain 02/13/2017  . History of rotator cuff tear 11/30/2016  . Impingement syndrome of right shoulder 11/30/2016  . Exacerbation of asthma 07/18/2016  . Hypokalemia 07/18/2016  . Hypertension 07/18/2016  . Depression with anxiety 07/18/2016  . Hypothyroidism 07/18/2016  . Hyperglycemia 07/18/2016  . Acute asthma exacerbation 07/18/2016  . Surgical wound dehiscence left hip; questionable superficial infection 11/10/2015  . Left hip postoperative wound infection 11/10/2015  . Osteoarthritis of left hip 09/09/2015  . 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 Obesity (BMI 35.0-39.9 without comorbidity) 04/28/2013    Marilyn [name],CHRIS, PTA 08/27/2019, 5:38 PM  [facility] [place], [name], [address] Phone: [phone]   Fax:  [phone]  Name: Marilyn [name] MRN: [id number] Date of Birth: 10-01-1970

## 2019-09-01 ENCOUNTER — Other Ambulatory Visit: Payer: Self-pay

## 2019-09-01 ENCOUNTER — Ambulatory Visit: Payer: Medicare Other | Admitting: Physical Therapy

## 2019-09-01 ENCOUNTER — Encounter: Payer: Self-pay | Admitting: Physical Therapy

## 2019-09-01 DIAGNOSIS — R293 Abnormal posture: Secondary | ICD-10-CM

## 2019-09-01 DIAGNOSIS — M5442 Lumbago with sciatica, left side: Secondary | ICD-10-CM

## 2019-09-01 DIAGNOSIS — M5441 Lumbago with sciatica, right side: Secondary | ICD-10-CM

## 2019-09-01 NOTE — Therapy (Signed)
[facility] [place], [name], [address] Phone: [phone]   Fax:  [phone]  Physical Therapy Treatment  Patient Details  Name: Marilyn [name] MRN: [id number] Date of Birth: [birth year] Referring Provider (PT): Basil Dess, MD   Encounter Date: 09/01/2019  PT End of Session - 09/01/19 1400    Visit Number  11    Number of Visits  12    Date for PT Re-Evaluation  09/02/19    Authorization Type  FOTO; PROGRESS NOTE AT 10TH VISIT.  KX MODIFIER AFTER 15 VISITS.    PT Start Time  1355    PT Stop Time  1442    PT Time Calculation (min)  47 min    Activity Tolerance  Patient tolerated treatment well;Patient limited by pain    Behavior During Therapy  [facility] for tasks assessed/performed       Past Medical History:  Diagnosis Date  . Anemia    taking iron now. pt states having no current issues 09/02/2015  . Anginal pain ([place])    pt states experiences chest wall pain pt states related to her asthma   . Anxiety    with MRI's  . Arthritis    Everywhere  . Asthma   . Depression   . Dizziness   . GERD (gastroesophageal reflux disease)   . Headache(784.0)    HX OF MIGRAINES  . History of bronchitis   . Hypertension   . Hypothyroidism    takes levothyroxen  . OSA on CPAP    wears cpap  . Shortness of breath    with exertion  . Wears glasses     Past Surgical History:  Procedure Laterality Date  . BACK SURGERY    . CESAREAN SECTION     times 2  . CHOLECYSTECTOMY    . ENDOMETRIAL ABLATION    . INCISION AND DRAINAGE HIP Left 11/10/2015   Procedure: IRRIGATION AND DEBRIDEMENT LEFT HIP INCISION;  Surgeon: Mcarthur Rossetti, MD;  Location: [place];  Service: Orthopedics;  Laterality: Left;  . JOINT REPLACEMENT  2011   total left knee  . KNEE ARTHROPLASTY  04/23/2012   right   . KNEE ARTHROSCOPY    . LUMBAR LAMINECTOMY/DECOMPRESSION MICRODISCECTOMY N/A 06/23/2019   Procedure: RIGHT LUMBAR FIVE THROUGH SACRAL  ONE PARTIAL HEMILAMINECTOMY WITH RIGHT LUMBAR FIVE FORAMINOTOMY;  Surgeon: Jessy Oto, MD;  Location: [place];  Service: Orthopedics;  Laterality: N/A;  . LUMBAR WOUND DEBRIDEMENT N/A 06/11/2018   Procedure: LUMBAR WOUND DEBRIDEMENT;  Surgeon: Jessy Oto, MD;  Location: [place];  Service: Orthopedics;  Laterality: N/A;  . RADIOLOGY WITH ANESTHESIA N/A 09/09/2018   Procedure: LUMBER SPINE WITHOUT CONTRAST;  Surgeon: Radiologist, Medication, MD;  Location: [place];  Service: Radiology;  Laterality: N/A;  . ROTATOR CUFF REPAIR     left   . SHOULDER SURGERY     right to repair ligament tear  . TOTAL HIP ARTHROPLASTY Left 09/09/2015   Procedure: LEFT TOTAL HIP ARTHROPLASTY ANTERIOR APPROACH;  Surgeon: Mcarthur Rossetti, MD;  Location: [facility];  Service: Orthopedics;  Laterality: Left;  . TOTAL KNEE ARTHROPLASTY  04/23/2012   Procedure: TOTAL KNEE ARTHROPLASTY;  Surgeon: Newt Minion, MD;  Location: [place];  Service: Orthopedics;  Laterality: Right;  Right Total Knee Arthroplasty  . TUBAL LIGATION  1996    There were no vitals filed for this visit.  Subjective Assessment - 09/01/19 1359    Subjective  COVID-19 screen performed prior  to patient entering clinic. Patient arrived with lumbar ache and more R shoulder pain.    Pertinent History  Fusion 05/2019, L5-S1 decompression 06/2019, RTC surgery, lumbar surgery, hip surgery.    How long can you walk comfortably?  comfortably around the home, needs UE support for community ambulation    Diagnostic tests  x-ray & MRI    Patient Stated Goals  Get out of pain.    Currently in Pain?  Yes    Pain Score  5     Pain Location  Back    Pain Orientation  Lower    Pain Descriptors / Indicators  Aching;Constant    Pain Type  Surgical pain    Pain Onset  More than a month ago    Pain Frequency  Constant         [facility] PT Assessment - 09/01/19 0001      Assessment   Medical Diagnosis  Neuropathic pain, Fusion of lumbar spine, Chronic pain     Referring Provider (PT)  Vira [place], MD    Onset Date/Surgical Date  06/23/19    Next MD Visit  09/04/2019   for injection   Prior Therapy  yes      Precautions   Precautions  Back    Precaution Comments  No bending lifting >5 lbs or twisting                   [facility] Adult PT Treatment/Exercise - 09/01/19 0001      Lumbar Exercises: Aerobic   Nustep  L5 (LEs only) x15 min      Modalities   Modalities  Electrical Stimulation;Moist Heat      Moist Heat Therapy   Number Minutes Moist Heat  15 Minutes    Moist Heat Location  Lumbar Spine      Electrical Stimulation   Electrical Stimulation Location  B low back    Electrical Stimulation Action  IFC    Electrical Stimulation Parameters  80-150 hz x15 min    Electrical Stimulation Goals  Pain      Manual Therapy   Manual Therapy  Soft tissue mobilization    Soft tissue mobilization  STW to low back, incision to reduce muscle tightness and scar tissue surrounding incision                  PT Long Term Goals - 08/20/19 1431      PT LONG TERM GOAL #1   Title  Patient will be independent with HEP    Time  6    Period  Weeks    Status  On-going      PT LONG TERM GOAL #2   Title  Patient will report ability to walk community distance with UE support with low back pain less than or equal to 3/10.    Time  6    Period  Weeks    Status  On-going   pain over 3/10 with prolong acyivity and walking 08/20/19     PT LONG TERM GOAL #3   Title  Patient will demonstrate proper bed mobilty to protect spine during transfers.    Period  Weeks    Status  On-going      PT LONG TERM GOAL #4   Title  Patient will report ability to perform ADLs independently with low back pain less than or equal to 3/10.    Time  6    Period  Weeks    Status  On-going   pain over a 3/10 with ADL's at this time 08/20/19           Plan - 09/01/19 1433    Clinical Impression Statement  Patient presented in clinic with reports  of mid level LBP/aching. Patient also presenting with increased R shoulder pain as well. Injection appointment is on Friday for hip. Scar tissue notable surrounding low back incision. No reports of any increased muscle tenderness during manual therapy session. Normal modalities response noted following removal of the modalities.    Personal Factors and Comorbidities  Comorbidity 1    Comorbidities  hemilaminectomy L5-S1 06/23/2019, RTC surgery, lumbar surgery, hip surgery.    Examination-Activity Limitations  Locomotion Level;Bed Mobility;Bend;Hygiene/Grooming;Stand;Squat;Sleep    Examination-Participation Restrictions  Cleaning;Shop    Stability/Clinical Decision Making  Stable/Uncomplicated    Rehab Potential  Good    PT Frequency  2x / week    PT Duration  6 weeks    PT Treatment/Interventions  Cryotherapy;Electrical Stimulation;Ultrasound;Therapeutic activities;Therapeutic exercise;Manual techniques;Patient/family education;Passive range of motion;Joint Manipulations    PT Next Visit Plan  cont with Nustep, core strengthening, body mechanics, scar mobilization once completely healed, modalities PRN for pain relief.    PT Home Exercise Plan  see patient education section    Consulted and Agree with Plan of Care  Patient       Patient will benefit from skilled therapeutic intervention in order to improve the following deficits and impairments:  Pain, Decreased activity tolerance, Difficulty walking, Decreased scar mobility, Postural dysfunction, Obesity  Visit Diagnosis: Abnormal posture  Acute bilateral low back pain with bilateral sciatica     Problem List Patient Active Problem List   Diagnosis Date Noted  . Other spondylosis with radiculopathy, lumbar region   . Status post lumbar laminectomy 06/23/2019  . C. difficile colitis 05/22/2019  . Hypophosphatemia 05/21/2019  . Syncope 05/20/2019  . OSA on CPAP   . Acute gastroenteritis   . Anemia due to blood loss, acute 06/18/2018     Class: Acute  . Plantar fasciitis of left foot 06/16/2018    Class: Chronic  . Tendonitis, Achilles, right 06/16/2018    Class: Chronic  . Osteochondral talar dome lesion 06/16/2018    Class: Chronic  . Deep incisional surgical site infection   . Superficial incisional surgical site infection 06/11/2018    Class: Acute  . Right ankle effusion 06/11/2018    Class: Acute  . Morbid (severe) obesity due to excess calories ([facility]) 05/29/2018    Class: Chronic  . Spondylolisthesis, lumbar region 05/27/2018    Class: Chronic  . Spinal stenosis of lumbar region 05/27/2018    Class: Chronic  . Urinary tract infection 05/27/2018    Class: Acute  . Fusion of spine of lumbar region 05/27/2018  . Pain of right hip joint 07/03/2017  . Acute right-sided low back pain with right-sided sciatica 03/27/2017  . Chronic right shoulder pain 02/13/2017  . History of rotator cuff tear 11/30/2016  . Impingement syndrome of right shoulder 11/30/2016  . Exacerbation of asthma 07/18/2016  . Hypokalemia 07/18/2016  . Hypertension 07/18/2016  . Depression with anxiety 07/18/2016  . Hypothyroidism 07/18/2016  . Hyperglycemia 07/18/2016  . Acute asthma exacerbation 07/18/2016  . Surgical wound dehiscence left hip; questionable superficial infection 11/10/2015  . Left hip postoperative wound infection 11/10/2015  . Osteoarthritis of left hip 09/09/2015  . 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 Obesity (BMI 35.0-39.9 without comorbidity) 04/28/2013    Marvell FullerKelsey P Nyaisha Simao,  PTA 09/01/2019, 2:49 PM  [facility] [address] [place], [place], [address] Phone: [phone]   Fax:  [phone]  Name: Marilyn [name] MRN: [id number] Date of Birth: Jul 06, 1971

## 2019-09-03 ENCOUNTER — Telehealth: Payer: Self-pay | Admitting: Physical Medicine and Rehabilitation

## 2019-09-03 ENCOUNTER — Ambulatory Visit: Payer: Medicare Other | Admitting: Physical Medicine and Rehabilitation

## 2019-09-03 NOTE — Telephone Encounter (Signed)
Pt is r/s to 09/22/2019

## 2019-09-03 NOTE — Telephone Encounter (Signed)
Received call from patient asking for a later appointment or reschedule her appointment. The number to contact patient is  [phone]

## 2019-09-08 ENCOUNTER — Ambulatory Visit: Payer: Medicare Other | Admitting: *Deleted

## 2019-09-08 ENCOUNTER — Other Ambulatory Visit: Payer: Self-pay

## 2019-09-08 DIAGNOSIS — R293 Abnormal posture: Secondary | ICD-10-CM | POA: Diagnosis not present

## 2019-09-08 DIAGNOSIS — M5442 Lumbago with sciatica, left side: Secondary | ICD-10-CM

## 2019-09-08 DIAGNOSIS — M5441 Lumbago with sciatica, right side: Secondary | ICD-10-CM

## 2019-09-08 NOTE — Therapy (Signed)
[facility] [place], [name], [address] Phone: [phone]   Fax:  [phone]  Physical Therapy Treatment PHYSICAL THERAPY DISCHARGE SUMMARY  Visits from Start of Care: 12  Current functional level related to goals / functional outcomes: See  below   Remaining deficits: See goals   Education / Equipment: HEP Plan: Patient agrees to discharge.  Patient goals were partially met. Patient is being discharged due to lack of progress.  ?????  Gabriela Eves, PT, DPT    Patient Details  Name: Marilyn [name] MRN: [id number] Date of Birth: 1970/09/27 Referring Provider (PT): Basil Dess, MD   Encounter Date: 09/08/2019  PT End of Session - 09/08/19 1439    Visit Number  12    Number of Visits  12    Date for PT Re-Evaluation  09/02/19    Authorization Type  FOTO; PROGRESS NOTE AT 10TH VISIT.  KX MODIFIER AFTER 15 VISITS.    PT Start Time  1345    PT Stop Time  1436    PT Time Calculation (min)  51 min       Past Medical History:  Diagnosis Date  . Anemia    taking iron now. pt states having no current issues 09/02/2015  . Anginal pain ([place])    pt states experiences chest wall pain pt states related to her asthma   . Anxiety    with MRI's  . Arthritis    Everywhere  . Asthma   . Depression   . Dizziness   . GERD (gastroesophageal reflux disease)   . Headache(784.0)    HX OF MIGRAINES  . History of bronchitis   . Hypertension   . Hypothyroidism    takes levothyroxen  . OSA on CPAP    wears cpap  . Shortness of breath    with exertion  . Wears glasses     Past Surgical History:  Procedure Laterality Date  . BACK SURGERY    . CESAREAN SECTION     times 2  . CHOLECYSTECTOMY    . ENDOMETRIAL ABLATION    . INCISION AND DRAINAGE HIP Left 11/10/2015   Procedure: IRRIGATION AND DEBRIDEMENT LEFT HIP INCISION;  Surgeon: Mcarthur Rossetti, MD;  Location: [place];  Service: Orthopedics;   Laterality: Left;  . JOINT REPLACEMENT  2011   total left knee  . KNEE ARTHROPLASTY  04/23/2012   right   . KNEE ARTHROSCOPY    . LUMBAR LAMINECTOMY/DECOMPRESSION MICRODISCECTOMY N/A 06/23/2019   Procedure: RIGHT LUMBAR FIVE THROUGH SACRAL ONE PARTIAL HEMILAMINECTOMY WITH RIGHT LUMBAR FIVE FORAMINOTOMY;  Surgeon: Jessy Oto, MD;  Location: [place];  Service: Orthopedics;  Laterality: N/A;  . LUMBAR WOUND DEBRIDEMENT N/A 06/11/2018   Procedure: LUMBAR WOUND DEBRIDEMENT;  Surgeon: Jessy Oto, MD;  Location: [place];  Service: Orthopedics;  Laterality: N/A;  . RADIOLOGY WITH ANESTHESIA N/A 09/09/2018   Procedure: LUMBER SPINE WITHOUT CONTRAST;  Surgeon: Radiologist, Medication, MD;  Location: [place];  Service: Radiology;  Laterality: N/A;  . ROTATOR CUFF REPAIR     left   . SHOULDER SURGERY     right to repair ligament tear  . TOTAL HIP ARTHROPLASTY Left 09/09/2015   Procedure: LEFT TOTAL HIP ARTHROPLASTY ANTERIOR APPROACH;  Surgeon: Mcarthur Rossetti, MD;  Location: [facility];  Service: Orthopedics;  Laterality: Left;  . TOTAL KNEE ARTHROPLASTY  04/23/2012   Procedure: TOTAL KNEE ARTHROPLASTY;  Surgeon: Newt Minion, MD;  Location: [place];  Service:  Orthopedics;  Laterality: Right;  Right Total Knee Arthroplasty  . TUBAL LIGATION  1996    There were no vitals filed for this visit.  Subjective Assessment - 09/08/19 1354    Subjective  COVID-19 screen performed prior to patient entering clinic.Last day today. LB feels some better with Therapy, but RT LE pain continues.    Pertinent History  Fusion 05/2019, L5-S1 decompression 06/2019, RTC surgery, lumbar surgery, hip surgery.    How long can you walk comfortably?  comfortably around the home, needs UE support for community ambulation    Diagnostic tests  x-ray & MRI    Patient Stated Goals  Get out of pain.    Currently in Pain?  Yes                       [facility] Adult PT Treatment/Exercise - 09/08/19 0001      Exercises    Exercises  Knee/Hip      Lumbar Exercises: Aerobic   Nustep  L5 (LEs only) x31mn      Modalities   Modalities  Electrical Stimulation;Moist Heat      Moist Heat Therapy   Number Minutes Moist Heat  15 Minutes    Moist Heat Location  Lumbar Spine      Electrical Stimulation   Electrical Stimulation Location  Bilateral low back. IFC 80-'150hz'  x 15 mins    Electrical Stimulation Action  seated    Electrical Stimulation Goals  Pain      Manual Therapy   Manual Therapy  Soft tissue mobilization    Soft tissue mobilization  STW to low back, paras and around incision to reduce muscle tightness and scar tissue surrounding incision               PT Short Term Goals - 09/08/19 1439      PT SHORT TERM GOAL #1   Title  Ind with a comprehensive HEP.    Time  4    Period  Weeks    Status  Achieved        PT Long Term Goals - 09/08/19 1440      PT LONG TERM GOAL #1   Title  Patient will be independent with HEP    Time  6    Period  Weeks    Status  Achieved      PT LONG TERM GOAL #2   Title  Patient will report ability to walk community distance with UE support with low back pain less than or equal to 3/10.    Time  6    Period  Weeks    Status  Not Met   5-6/10 at times     PT LONG TERM GOAL #3   Title  Patient will demonstrate proper bed mobilty to protect spine during transfers.    Time  6    Period  Weeks    Status  Achieved      PT LONG TERM GOAL #4   Title  Patient will report ability to perform ADLs independently with low back pain less than or equal to 3/10.    Time  6    Period  Weeks    Status  Partially Met            Plan - 09/08/19 1514    Clinical Impression Statement  Pt arrived today doing better with overall LBP 2/10. She feels that PT has helped with overall LBp, but continues to  have increased pain with ADL's and unable to meet LTGs due to pain. She also continues to have RT hip and LE pain which is about the same. She is to have hip  injection soon.FOTO score today 55% limited    Personal Factors and Comorbidities  Comorbidity 1    Comorbidities  hemilaminectomy L5-S1 06/23/2019, RTC surgery, lumbar surgery, hip surgery.    Examination-Activity Limitations  Locomotion Level;Bed Mobility;Bend;Hygiene/Grooming;Stand;Squat;Sleep    Examination-Participation Restrictions  Cleaning;Shop    Stability/Clinical Decision Making  Stable/Uncomplicated    Rehab Potential  Good    PT Frequency  2x / week    PT Duration  6 weeks    PT Treatment/Interventions  Cryotherapy;Electrical Stimulation;Ultrasound;Therapeutic activities;Therapeutic exercise;Manual techniques;Patient/family education;Passive range of motion;Joint Manipulations    PT Next Visit Plan  DC to HEP    PT Home Exercise Plan  see patient education section    Consulted and Agree with Plan of Care  Patient       Patient will benefit from skilled therapeutic intervention in order to improve the following deficits and impairments:  Pain, Decreased activity tolerance, Difficulty walking, Decreased scar mobility, Postural dysfunction, Obesity  Visit Diagnosis: Abnormal posture  Acute bilateral low back pain with bilateral sciatica     Problem List Patient Active Problem List   Diagnosis Date Noted  . Other spondylosis with radiculopathy, lumbar region   . Status post lumbar laminectomy 06/23/2019  . C. difficile colitis 05/22/2019  . Hypophosphatemia 05/21/2019  . Syncope 05/20/2019  . OSA on CPAP   . Acute gastroenteritis   . Anemia due to blood loss, acute 06/18/2018    Class: Acute  . Plantar fasciitis of left foot 06/16/2018    Class: Chronic  . Tendonitis, Achilles, right 06/16/2018    Class: Chronic  . Osteochondral talar dome lesion 06/16/2018    Class: Chronic  . Deep incisional surgical site infection   . Superficial incisional surgical site infection 06/11/2018    Class: Acute  . Right ankle effusion 06/11/2018    Class: Acute  . Morbid  (severe) obesity due to excess calories ([place]) 05/29/2018    Class: Chronic  . Spondylolisthesis, lumbar region 05/27/2018    Class: Chronic  . Spinal stenosis of lumbar region 05/27/2018    Class: Chronic  . Urinary tract infection 05/27/2018    Class: Acute  . Fusion of spine of lumbar region 05/27/2018  . Pain of right hip joint 07/03/2017  . Acute right-sided low back pain with right-sided sciatica 03/27/2017  . Chronic right shoulder pain 02/13/2017  . History of rotator cuff tear 11/30/2016  . Impingement syndrome of right shoulder 11/30/2016  . Exacerbation of asthma 07/18/2016  . Hypokalemia 07/18/2016  . Hypertension 07/18/2016  . Depression with anxiety 07/18/2016  . Hypothyroidism 07/18/2016  . Hyperglycemia 07/18/2016  . Acute asthma exacerbation 07/18/2016  . Surgical wound dehiscence left hip; questionable superficial infection 11/10/2015  . Left hip postoperative wound infection 11/10/2015  . Osteoarthritis of left hip 09/09/2015  . 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 Obesity (BMI 35.0-39.9 without comorbidity) 04/28/2013    Krina Mraz,CHRIS, PTA 09/08/2019, 3:38 PM  [facility] [place], [name], [address] Phone: [phone]   Fax:  [phone]  Name: Marilyn [name] MRN: [id number] Date of Birth: 02-Jul-1971

## 2019-09-17 ENCOUNTER — Ambulatory Visit: Payer: Self-pay

## 2019-09-17 ENCOUNTER — Other Ambulatory Visit: Payer: Self-pay

## 2019-09-17 ENCOUNTER — Ambulatory Visit (INDEPENDENT_AMBULATORY_CARE_PROVIDER_SITE_OTHER): Payer: Medicare Other | Admitting: Orthopedic Surgery

## 2019-09-17 ENCOUNTER — Encounter: Payer: Self-pay | Admitting: Orthopedic Surgery

## 2019-09-17 DIAGNOSIS — M25511 Pain in right shoulder: Secondary | ICD-10-CM | POA: Diagnosis not present

## 2019-09-20 ENCOUNTER — Other Ambulatory Visit (INDEPENDENT_AMBULATORY_CARE_PROVIDER_SITE_OTHER): Payer: Self-pay | Admitting: Specialist

## 2019-09-22 ENCOUNTER — Other Ambulatory Visit: Payer: Self-pay

## 2019-09-22 ENCOUNTER — Ambulatory Visit (INDEPENDENT_AMBULATORY_CARE_PROVIDER_SITE_OTHER): Payer: Medicare Other | Admitting: Physical Medicine and Rehabilitation

## 2019-09-22 ENCOUNTER — Encounter: Payer: Self-pay | Admitting: Physical Medicine and Rehabilitation

## 2019-09-22 ENCOUNTER — Ambulatory Visit: Payer: Medicare Other

## 2019-09-22 DIAGNOSIS — M25551 Pain in right hip: Secondary | ICD-10-CM | POA: Diagnosis not present

## 2019-09-22 MED ORDER — TRIAMCINOLONE ACETONIDE 40 MG/ML IJ SUSP
40.0000 mg | INTRAMUSCULAR | Status: AC | PRN
  Administered 2019-09-22: 40 mg via INTRA_ARTICULAR

## 2019-09-22 MED ORDER — BUPIVACAINE HCL 0.25 % IJ SOLN
4.0000 mL | INTRAMUSCULAR | Status: AC | PRN
  Administered 2019-09-22: 4 mL via INTRA_ARTICULAR

## 2019-09-22 NOTE — Progress Notes (Signed)
Marilyn [name] - 49 y.o. female MRN [id number]  Date of birth: 22-Dec-1970  Office Visit Note: Visit Date: 09/22/2019 PCP: Rebecka Apley, NP Referred by: Rebecka Apley, NP  Subjective: Chief Complaint  Patient presents with  . Right Hip - Pain   HPI: Marilyn [name] is a 49 y.o. female who comes in today At the request of Dr. Vira Browns for diagnostic and therapeutic anesthetic hip arthrogram on the right.  Patient has prior left total hip arthroplasty and L4-S1 lumbar fusion.  Her history is complicated by morbid obesity.  ROS Otherwise per HPI.  Assessment & Plan: Visit Diagnoses:  1. Pain in right hip     Plan: Findings:  Patient seemed to have some relief during the anesthetic phase of the injection.  She is still walking somewhat with a limp however.    Meds & Orders: No orders of the defined types were placed in this encounter.   Orders Placed This Encounter  Procedures  . Large Joint Inj  . XR C-ARM NO REPORT    Follow-up: No follow-ups on file.   Procedures: Large Joint Inj: R hip joint on 09/22/2019 12:01 PM Indications: pain and diagnostic evaluation Details: 22 G needle, anterior approach  Arthrogram: Yes  Medications: 4 mL bupivacaine 0.25 %; 40 mg triamcinolone acetonide 40 MG/ML Outcome: tolerated well, no immediate complications  Arthrogram demonstrated excellent flow of contrast throughout the joint surface without extravasation or obvious defect.  The patient had relief of symptoms during the anesthetic phase of the injection.  Procedure, treatment alternatives, risks and benefits explained, specific risks discussed. Consent was given by the patient. Immediately prior to procedure a time out was called to verify the correct patient, procedure, equipment, support staff and site/side marked as required. Patient was prepped and draped in the usual sterile fashion.      No notes on file   Clinical History: MRI LUMBAR SPINE WITHOUT  CONTRAST      The patient required anesthesia.    COMPARISON: None.    FINDINGS:  Segmentation: Standard.    Alignment: Trace anterolisthesis L4-5 and L5-S1 improved  postoperatively.    Vertebrae: Slight inferior L4 endplate T2 and STIR hyperintensity,  favored to represent sequelae of cage subsidence, rather than early  changes of discitis/osteomyelitis.    Conus medullaris and cauda equina: Conus extends to the L1 level.  Conus and cauda equina appear normal.    Paraspinal and other soft tissues: No psoas inflammation or abscess.  No epidural inflammation.    There is no significant postoperative fluid collection in the  paravertebral soft tissues although moderate inflammation along the  course of the incision is observed, an expected postoperative  finding.    Disc levels:    L1-L2: Facet arthropathy. Unremarkable disc space. No impingement.    L2-L3: Facet arthropathy. Unremarkable disc space. No impingement.    L3-L4: Advanced facet arthropathy and ligamentum flavum hypertrophy.  Unremarkable disc space. Borderline stenosis without compressive  subarticular zone narrowing. BILATERAL foraminal zone narrowing,  slightly worse on the RIGHT, could affect either L3 nerve root. See  series 5, image 6    L4-L5: Status post L4-5 TLIF. Artifact related to pedicle screw and  rod construct. Improved stenosis and subarticular zone narrowing  postoperatively. Residual disc material and facet arthropathy narrow  the RIGHT foraminal zone, potentially compressing the RIGHT L4 nerve  root. See for instance series 7, image 5.    L5-S1: Status post L5-S1 TLIF. Artifact related  to pedicle screw and  rod construct. Improved stenosis and subarticular zone narrowing  postoperatively. Residual disc material and multifactorial foraminal  narrowing bilaterally could affect either L5 nerve root.    IMPRESSION:  Status post L4-S1 TLIF. No significant postoperative  extra-spinal  fluid collection. No features concerning for discitis/osteomyelitis.    Slight inferior L4 endplate T2 and STIR hyperintensity, favored to  represent early cage subsidence. Continued surveillance is  warranted.    Improved stenosis and subarticular zone narrowing at L4-5 and L5-S1.  Correlate clinically for residual symptomatic RIGHT-sided foraminal  narrowing at either or both levels.    BILATERAL foraminal narrowing is observed at L3-L4, without interval  changes of disc protrusion/adjacent segment disease. Either L3 nerve  root could be affected.     Electronically Signed  By: Staci Righter M.D.  On: 09/09/2018 10:00 -- MR OF THE RIGHT HIP WITHOUT CONTRAST   FINDINGS: Bones: Prior left hip arthroplasty with susceptibility artifact partially obscuring the adjacent soft tissue and osseous structures. No periarticular fluid collection or osteolysis.  No right hip fracture, dislocation or avascular necrosis. Superior and inferior pubic rami are intact. No focal marrow signal abnormality. Normal sacrum and sacroiliac joints.  Articular cartilage and labrum  Articular cartilage:  Chondromalacia of the right hip.  Labrum: Severe superior anterior right labral degeneration with a tear.  Joint or bursal effusion  Joint effusion:  No joint effusion.  Bursae:  No bursa formation.  Muscles and tendons  Flexors: Normal.  Extensors: Normal.  Abductors: Normal.  Adductors: Normal.  Rotators: Normal.  Hamstrings: Normal.  Other findings  Miscellaneous: No fluid collection or hematoma. No pelvic free fluid. Postsurgical changes in the soft tissues overlying the left hip.  IMPRESSION: 1. Severe superior anterior right labral degeneration with a tear. 2. Chondromalacia of the right hip. 3. Prior left hip arthroplasty with susceptibility artifact partially obscuring the adjacent soft tissue and osseous structures. No  periarticular fluid collection or osteolysis.   Electronically Signed   By: Kathreen Devoid   On: 01/29/2016 13:23   She reports that she quit smoking about 28 years ago. She has a 2.00 pack-year smoking history. She has never used smokeless tobacco.  Recent Labs    01/08/19 1028  LABURIC 5.9    Objective:  VS:  HT:    WT:   BMI:     BP:   HR: bpm  TEMP: ( )  RESP:  Physical Exam  Ortho Exam Imaging: XR C-ARM NO REPORT  Result Date: 09/22/2019 Please see Notes tab for imaging impression.   Past Medical/Family/Surgical/Social History: Medications & Allergies reviewed per EMR, new medications updated. Patient Active Problem List   Diagnosis Date Noted  . Other spondylosis with radiculopathy, lumbar region   . Status post lumbar laminectomy 06/23/2019  . C. difficile colitis 05/22/2019  . Hypophosphatemia 05/21/2019  . Syncope 05/20/2019  . OSA on CPAP   . Acute gastroenteritis   . Anemia due to blood loss, acute 06/18/2018    Class: Acute  . Plantar fasciitis of left foot 06/16/2018    Class: Chronic  . Tendonitis, Achilles, right 06/16/2018    Class: Chronic  . Osteochondral talar dome lesion 06/16/2018    Class: Chronic  . Deep incisional surgical site infection   . Superficial incisional surgical site infection 06/11/2018    Class: Acute  . Right ankle effusion 06/11/2018    Class: Acute  . Morbid (severe) obesity due to excess calories ([place]) 05/29/2018  Class: Chronic  . Spondylolisthesis, lumbar region 05/27/2018    Class: Chronic  . Spinal stenosis of lumbar region 05/27/2018    Class: Chronic  . Urinary tract infection 05/27/2018    Class: Acute  . Fusion of spine of lumbar region 05/27/2018  . Pain of right hip joint 07/03/2017  . Acute right-sided low back pain with right-sided sciatica 03/27/2017  . Chronic right shoulder pain 02/13/2017  . History of rotator cuff tear 11/30/2016  . Impingement syndrome of right shoulder 11/30/2016  .  Exacerbation of asthma 07/18/2016  . Hypokalemia 07/18/2016  . Hypertension 07/18/2016  . Depression with anxiety 07/18/2016  . Hypothyroidism 07/18/2016  . Hyperglycemia 07/18/2016  . Acute asthma exacerbation 07/18/2016  . Surgical wound dehiscence left hip; questionable superficial infection 11/10/2015  . Left hip postoperative wound infection 11/10/2015  . Osteoarthritis of left hip 09/09/2015  . 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 Obesity (BMI 35.0-39.9 without comorbidity) 04/28/2013   Past Medical History:  Diagnosis Date  . Anemia    taking iron now. pt states having no current issues 09/02/2015  . Anginal pain (H[facility])    pt states experiences chest wall pain pt states related to her asthma   . Anxiety    with MRI's  . Arthritis    Everywhere  . Asthma   . Depression   . Dizziness   . GERD (gastroesophageal reflux disease)   . Headache(784.0)    HX OF MIGRAINES  . History of bronchitis   . Hypertension   . Hypothyroidism    takes levothyroxen  . OSA on CPAP    wears cpap  . Shortness of breath    with exertion  . Wears glasses    Family History  Problem Relation Age of Onset  . Cancer Mother        colon  . Epilepsy Mother   . Cancer Father        prostate  . Diabetes Father   . Hypertension Father   . Hypertension Maternal Aunt   . Diabetes Maternal Aunt   . Hypertension Paternal Aunt    Past Surgical History:  Procedure Laterality Date  . BACK SURGERY    . CESAREAN SECTION     times 2  . CHOLECYSTECTOMY    . ENDOMETRIAL ABLATION    . INCISION AND DRAINAGE HIP Left 11/10/2015   Procedure: IRRIGATION AND DEBRIDEMENT LEFT HIP INCISION;  Surgeon: Kathryne Hitch, MD;  Location: [facility] OR;  Service: Orthopedics;  Laterality: Left;  . JOINT REPLACEMENT  2011   total left knee  . KNEE ARTHROPLASTY  04/23/2012   right   . KNEE ARTHROSCOPY    . LUMBAR LAMINECTOMY/DECOMPRESSION MICRODISCECTOMY N/A 06/23/2019   Procedure: RIGHT  LUMBAR FIVE THROUGH SACRAL ONE PARTIAL HEMILAMINECTOMY WITH RIGHT LUMBAR FIVE FORAMINOTOMY;  Surgeon: Kerrin Champagne, MD;  Location: [facility] OR;  Service: Orthopedics;  Laterality: N/A;  . LUMBAR WOUND DEBRIDEMENT N/A 06/11/2018   Procedure: LUMBAR WOUND DEBRIDEMENT;  Surgeon: Kerrin Champagne, MD;  Location: [facility] OR;  Service: Orthopedics;  Laterality: N/A;  . RADIOLOGY WITH ANESTHESIA N/A 09/09/2018   Procedure: LUMBER SPINE WITHOUT CONTRAST;  Surgeon: Radiologist, Medication, MD;  Location: [facility] OR;  Service: Radiology;  Laterality: N/A;  . ROTATOR CUFF REPAIR     left   . SHOULDER SURGERY     right to repair ligament tear  . TOTAL HIP ARTHROPLASTY Left 09/09/2015   Procedure: LEFT TOTAL HIP ARTHROPLASTY ANTERIOR APPROACH;  Surgeon: Kathryne Hitch, MD;  Location: [facility];  Service: Orthopedics;  Laterality: Left;  . TOTAL KNEE ARTHROPLASTY  04/23/2012   Procedure: TOTAL KNEE ARTHROPLASTY;  Surgeon: [name], MD;  Location: [facility] OR;  Service: Orthopedics;  Laterality: Right;  Right Total Knee Arthroplasty  . TUBAL LIGATION  1996   Social History   Occupational History  . Not on file  Tobacco Use  . Smoking status: Former Smoker    Packs/day: 0.50    Years: 4.00    Pack years: 2.00    Quit date: 09/18/1991    Years since quitting: 28.0  . Smokeless tobacco: Never Used  Substance and Sexual Activity  . Alcohol use: No  . Drug use: No  . Sexual activity: Yes    Birth control/protection: Surgical

## 2019-09-22 NOTE — Progress Notes (Signed)
 .  Numeric Pain Rating Scale and Functional Assessment Average Pain 8   In the last MONTH (on 0-10 scale) has pain interfered with the following?  1. General activity like being  able to carry out your everyday physical activities such as walking, climbing stairs, carrying groceries, or moving a chair?  Rating(10)   -Dye Allergies.  

## 2019-09-23 ENCOUNTER — Encounter: Payer: Medicare Other | Admitting: Neurology

## 2019-09-28 ENCOUNTER — Encounter: Payer: Self-pay | Admitting: Orthopedic Surgery

## 2019-09-28 DIAGNOSIS — M25511 Pain in right shoulder: Secondary | ICD-10-CM

## 2019-09-28 MED ORDER — LIDOCAINE HCL 1 % IJ SOLN
5.0000 mL | INTRAMUSCULAR | Status: AC | PRN
  Administered 2019-09-28: 13:00:00 5 mL

## 2019-09-28 MED ORDER — METHYLPREDNISOLONE ACETATE 40 MG/ML IJ SUSP
40.0000 mg | INTRAMUSCULAR | Status: AC | PRN
  Administered 2019-09-28: 13:00:00 40 mg via INTRA_ARTICULAR

## 2019-09-28 NOTE — Progress Notes (Signed)
Office Visit Note   Patient: Marilyn [name]           Date of Birth: 10-21-1970           MRN: [id number] Visit Date: 09/17/2019              Requested by: Rebecka Apley, NP [address] [place],  [place] [address] PCP: Rebecka Apley, NP  Chief Complaint  Patient presents with  . Right Shoulder - Pain      HPI: Patient is a 49 year old woman who presents with chronic right shoulder pain.  She states she did have an injection last year with Denny Peon that did not help.  She states she fell 1 month ago on her right shoulder and has increased shoulder pain.  Assessment & Plan: Visit Diagnoses:  1. Acute pain of right shoulder     Plan: Radiographs show no fractures she underwent a subacromial injection patient was given instructions for internal and external rotation strengthening and scapular stabilization.  Follow-Up Instructions: Return if symptoms worsen or fail to improve.   Ortho Exam  Patient is alert, oriented, no adenopathy, well-dressed, normal affect, normal respiratory effort. Examination patient has abduction and 90 degrees internal and external rotation of 45 degrees there is no adhesive capsulitis.  She has pain with Neer and Hawkins impingement test pain with a drop arm test.  Imaging: No results found. No images are attached to the encounter.  Labs: Lab Results  Component Value Date   HGBA1C 6.2 (H) 07/18/2016   HGBA1C 6.2 (H) 09/02/2015   ESRSEDRATE 56 (H) 01/08/2019   ESRSEDRATE CANCELED 09/18/2018   ESRSEDRATE 67 (H) 08/18/2018   CRP 30.7 (H) 01/08/2019   CRP 16.3 (H) 09/18/2018   CRP 20.9 (H) 08/18/2018   LABURIC 5.9 01/08/2019   LABURIC 8.7 (H) 06/16/2018   REPTSTATUS 05/22/2019 FINAL 05/20/2019   GRAMSTAIN  06/11/2018    FEW WBC PRESENT,BOTH PMN AND MONONUCLEAR NO ORGANISMS SEEN    CULT  05/20/2019    Multiple bacterial morphotypes present, none predominant. Suggest appropriate recollection if clinically  indicated.   LABORGA PSEUDOMONAS AERUGINOSA 06/11/2018     Lab Results  Component Value Date   ALBUMIN 3.9 06/17/2019   ALBUMIN 4.1 05/20/2019   ALBUMIN 3.3 (L) 07/10/2018   LABURIC 5.9 01/08/2019   LABURIC 8.7 (H) 06/16/2018    Lab Results  Component Value Date   MG 1.7 05/20/2019   No results found for: VD25OH  No results found for: PREALBUMIN CBC EXTENDED Latest Ref Rng & Units 06/24/2019 06/17/2019 05/20/2019  WBC 4.0 - 10.5 K/uL 10.2 7.3 6.7  RBC 3.87 - 5.11 MIL/uL 3.77(L) 4.37 4.16  HGB 12.0 - 15.0 g/dL 10.9(L) 12.3 11.7(L)  HCT 36.0 - 46.0 % 34.5(L) 40.6 38.5  PLT 150 - 400 K/uL 265 294 292  NEUTROABS 1.7 - 7.7 K/uL - - 4.2  LYMPHSABS 0.7 - 4.0 K/uL - - 1.9     There is no height or weight on file to calculate BMI.  Orders:  Orders Placed This Encounter  Procedures  . XR Shoulder Right   No orders of the defined types were placed in this encounter.    Procedures: Large Joint Inj: R subacromial bursa on 09/28/2019 12:37 PM Indications: diagnostic evaluation and pain Details: 22 G 1.5 in needle, posterior approach  Arthrogram: No  Medications: 5 mL lidocaine 1 %; 40 mg methylPREDNISolone acetate 40 MG/ML Outcome: tolerated well, no immediate complications Procedure, treatment alternatives,  risks and benefits explained, specific risks discussed. Consent was given by the patient. Immediately prior to procedure a time out was called to verify the correct patient, procedure, equipment, support staff and site/side marked as required. Patient was prepped and draped in the usual sterile fashion.      Clinical Data: No additional findings.  ROS:  All other systems negative, except as noted in the HPI. Review of Systems  Objective: Vital Signs: There were no vitals taken for this visit.  Specialty Comments:  No specialty comments available.  PMFS History: Patient Active Problem List   Diagnosis Date Noted  . Other spondylosis with radiculopathy, lumbar  region   . Status post lumbar laminectomy 06/23/2019  . C. difficile colitis 05/22/2019  . Hypophosphatemia 05/21/2019  . Syncope 05/20/2019  . OSA on CPAP   . Acute gastroenteritis   . Anemia due to blood loss, acute 06/18/2018    Class: Acute  . Plantar fasciitis of left foot 06/16/2018    Class: Chronic  . Tendonitis, Achilles, right 06/16/2018    Class: Chronic  . Osteochondral talar dome lesion 06/16/2018    Class: Chronic  . Deep incisional surgical site infection   . Superficial incisional surgical site infection 06/11/2018    Class: Acute  . Right ankle effusion 06/11/2018    Class: Acute  . Morbid (severe) obesity due to excess calories ([place]) 05/29/2018    Class: Chronic  . Spondylolisthesis, lumbar region 05/27/2018    Class: Chronic  . Spinal stenosis of lumbar region 05/27/2018    Class: Chronic  . Urinary tract infection 05/27/2018    Class: Acute  . Fusion of spine of lumbar region 05/27/2018  . Pain of right hip joint 07/03/2017  . Acute right-sided low back pain with right-sided sciatica 03/27/2017  . Chronic right shoulder pain 02/13/2017  . History of rotator cuff tear 11/30/2016  . Impingement syndrome of right shoulder 11/30/2016  . Exacerbation of asthma 07/18/2016  . Hypokalemia 07/18/2016  . Hypertension 07/18/2016  . Depression with anxiety 07/18/2016  . Hypothyroidism 07/18/2016  . Hyperglycemia 07/18/2016  . Acute asthma exacerbation 07/18/2016  . Surgical wound dehiscence left hip; questionable superficial infection 11/10/2015  . Left hip postoperative wound infection 11/10/2015  . Osteoarthritis of left hip 09/09/2015  . 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 Obesity (BMI 35.0-39.9 without comorbidity) 04/28/2013   Past Medical History:  Diagnosis Date  . Anemia    taking iron now. pt states having no current issues 09/02/2015  . Anginal pain ([place])    pt states experiences chest wall pain pt states related to her asthma    . Anxiety    with MRI's  . Arthritis    Everywhere  . Asthma   . Depression   . Dizziness   . GERD (gastroesophageal reflux disease)   . Headache(784.0)    HX OF MIGRAINES  . History of bronchitis   . Hypertension   . Hypothyroidism    takes levothyroxen  . OSA on CPAP    wears cpap  . Shortness of breath    with exertion  . Wears glasses     Family History  Problem Relation Age of Onset  . Cancer Mother        colon  . Epilepsy Mother   . Cancer Father        prostate  . Diabetes Father   . Hypertension Father   . Hypertension Maternal Aunt   . Diabetes Maternal  Aunt   . Hypertension Paternal Aunt     Past Surgical History:  Procedure Laterality Date  . BACK SURGERY    . CESAREAN SECTION     times 2  . CHOLECYSTECTOMY    . ENDOMETRIAL ABLATION    . INCISION AND DRAINAGE HIP Left 11/10/2015   Procedure: IRRIGATION AND DEBRIDEMENT LEFT HIP INCISION;  Surgeon: Kathryne Hitch, MD;  Location: [facility] OR;  Service: Orthopedics;  Laterality: Left;  . JOINT REPLACEMENT  2011   total left knee  . KNEE ARTHROPLASTY  04/23/2012   right   . KNEE ARTHROSCOPY    . LUMBAR LAMINECTOMY/DECOMPRESSION MICRODISCECTOMY N/A 06/23/2019   Procedure: RIGHT LUMBAR FIVE THROUGH SACRAL ONE PARTIAL HEMILAMINECTOMY WITH RIGHT LUMBAR FIVE FORAMINOTOMY;  Surgeon: Kerrin Champagne, MD;  Location: [facility] OR;  Service: Orthopedics;  Laterality: N/A;  . LUMBAR WOUND DEBRIDEMENT N/A 06/11/2018   Procedure: LUMBAR WOUND DEBRIDEMENT;  Surgeon: Kerrin Champagne, MD;  Location: [facility] OR;  Service: Orthopedics;  Laterality: N/A;  . RADIOLOGY WITH ANESTHESIA N/A 09/09/2018   Procedure: LUMBER SPINE WITHOUT CONTRAST;  Surgeon: Radiologist, Medication, MD;  Location: [facility] OR;  Service: Radiology;  Laterality: N/A;  . ROTATOR CUFF REPAIR     left   . SHOULDER SURGERY     right to repair ligament tear  . TOTAL HIP ARTHROPLASTY Left 09/09/2015   Procedure: LEFT TOTAL HIP ARTHROPLASTY ANTERIOR APPROACH;  Surgeon:  Kathryne Hitch, MD;  Location: [facility];  Service: Orthopedics;  Laterality: Left;  . TOTAL KNEE ARTHROPLASTY  04/23/2012   Procedure: TOTAL KNEE ARTHROPLASTY;  Surgeon: [name], MD;  Location: [facility] OR;  Service: Orthopedics;  Laterality: Right;  Right Total Knee Arthroplasty  . TUBAL LIGATION  1996   Social History   Occupational History  . Not on file  Tobacco Use  . Smoking status: Former Smoker    Packs/day: 0.50    Years: 4.00    Pack years: 2.00    Quit date: 09/18/1991    Years since quitting: 28.0  . Smokeless tobacco: Never Used  Substance and Sexual Activity  . Alcohol use: No  . Drug use: No  . Sexual activity: Yes    Birth control/protection: Surgical

## 2019-10-17 ENCOUNTER — Other Ambulatory Visit (INDEPENDENT_AMBULATORY_CARE_PROVIDER_SITE_OTHER): Payer: Self-pay | Admitting: Specialist

## 2019-10-20 ENCOUNTER — Other Ambulatory Visit: Payer: Self-pay

## 2019-10-20 ENCOUNTER — Encounter (INDEPENDENT_AMBULATORY_CARE_PROVIDER_SITE_OTHER): Payer: Medicare Other | Admitting: Neurology

## 2019-10-20 ENCOUNTER — Ambulatory Visit (INDEPENDENT_AMBULATORY_CARE_PROVIDER_SITE_OTHER): Payer: Medicare Other | Admitting: Neurology

## 2019-10-20 DIAGNOSIS — G5711 Meralgia paresthetica, right lower limb: Secondary | ICD-10-CM

## 2019-10-20 DIAGNOSIS — R208 Other disturbances of skin sensation: Secondary | ICD-10-CM | POA: Diagnosis not present

## 2019-10-20 DIAGNOSIS — M79604 Pain in right leg: Secondary | ICD-10-CM | POA: Diagnosis not present

## 2019-10-20 DIAGNOSIS — Z0289 Encounter for other administrative examinations: Secondary | ICD-10-CM

## 2019-10-20 NOTE — Progress Notes (Signed)
Full Name: Marilyn [name] Gender: Female MRN #: [id number] Date of Birth: [birth year]    Visit Date: 10/20/2019 08:04 Age: 49 Years Examining Physician: Despina Arias, MD    History:  Marilyn [name] is a 49 year old woman with pain in the right anterolateral thigh that developed after spinal surgery September 2020.  The onset of the pain was fairly sudden and was intense.  Currently she has dysesthesias in the anterolateral thigh in the distribution of the lateral femoral cutaneous nerve.  Sensation was normal elsewhere.  Strength was normal and symmetric.  Nerve conduction studies: The peroneal and tibial motor responses had normal distal latencies and amplitudes.  The peroneal responses had normal conduction velocities.  Due to body habitus, tibial responses could not be obtained with stimulation behind the knee.  However, the tibial F-wave latencies were normal.  Electromyography: Needle EMG of selected muscles of the right leg was performed.  Mild chronic denervation was noted in the right peroneus longus and gluteus medius muscles.  The gastrocnemius and tibialis anterior muscles also showed some polyphasic motor units but recruitment was normal.  Other muscles tested were normal.  There was no abnormal spontaneous activity in any of the muscles tested.  Impression: This NCV/EMG study shows the following: 1.   Mild right S1 (+/- L5) chronic radiculopathy without active features. 2.   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  Consider increasing the gabapentin dose as tolerated and consider adding lamotrigine (titrating to 100 mg twice daily) if necessary.  Mohamedamin Nifong A. Epimenio Foot, MD, PhD, FAAN Certified in Neurology, Clinical Neurophysiology, Sleep Medicine, Pain Medicine and Neuroimaging Director, [facility]  [facility] [address] [place], [place]  [address] [phone]   [facility]    Nerve / Sites Muscle Latency Ref. Amplitude Ref. Rel Amp Segments Distance Velocity Ref. Area    ms ms mV mV %  cm m/s m/s mVms  R Peroneal - EDB     Ankle EDB 4.3 ?6.5 6.9 ?2.0 100 Ankle - EDB 9   20.3     Fib head EDB 9.9  5.9  85.9 Fib head - Ankle 28 50 ?44 19.2     Pop fossa EDB 11.5  6.0  102 Pop fossa - Fib head 8 49 ?44 19.9         Pop fossa - Ankle      L Peroneal - EDB     Ankle EDB 4.6 ?6.5 7.2 ?2.0 100 Ankle - EDB 9   20.8     Fib head EDB 10.9  6.6  91.2 Fib head - Ankle 28 44 ?44 19.7     Pop fossa EDB 12.5  4.6  69.6 Pop fossa - Fib head 7 45 ?44 14.1         Pop fossa - Ankle      R Tibial - AH     Ankle AH 5.2 ?5.8 6.3 ?4.0 100 Ankle - AH 9   12.4  L Tibial - AH     Ankle AH 4.5 ?5.8 7.2 ?4.0 100 Ankle - AH 9   14.3             SNC    Nerve / Sites Rec. Site Peak Lat Ref.  Amp Ref. Segments Distance    ms ms V V  cm  R Sural - Ankle (Calf)     Calf Ankle 3.5 ?  4.4 15 ?6 Calf - Ankle 14  L Sural - Ankle (Calf)     Calf Ankle 3.0 ?4.4 10 ?6 Calf - Ankle 14  R Superficial peroneal - Ankle     Lat leg Ankle 3.9 ?4.4 6 ?6 Lat leg - Ankle 14  L Superficial peroneal - Ankle     Lat leg Ankle 4.0 ?4.4 6 ?6 Lat leg - Ankle 14             F  Wave    Nerve F Lat Ref.   ms ms  R Tibial - AH 49.7 ?56.0  L Tibial - AH 52.8 ?56.0         EMG Summary Table    Spontaneous MUAP Recruitment  Muscle IA Fib PSW Fasc Other Amp Dur. Poly Pattern  R. Vastus medialis Normal None None None _______ Normal Normal Normal Normal  R. Tibialis anterior Normal None None None _______ Normal Normal 1+ Normal  R. Peroneus longus Normal None None None _______ Normal Normal 1+ Reduced  R. Gastrocnemius (Medial head) Normal None None None _______ Normal Normal 1+ Normal  R. Gluteus medius Normal None None None _______ Normal Normal 1+ Reduced  R. Iliopsoas Normal None None None _______ Normal Normal Normal Normal

## 2019-10-26 ENCOUNTER — Ambulatory Visit (INDEPENDENT_AMBULATORY_CARE_PROVIDER_SITE_OTHER): Payer: Medicare Other | Admitting: Specialist

## 2019-10-26 ENCOUNTER — Encounter: Payer: Self-pay | Admitting: Specialist

## 2019-10-26 ENCOUNTER — Other Ambulatory Visit: Payer: Self-pay

## 2019-10-26 VITALS — BP 147/83 | HR 77 | Ht 63.0 in | Wt 343.0 lb

## 2019-10-26 DIAGNOSIS — G8929 Other chronic pain: Secondary | ICD-10-CM

## 2019-10-26 DIAGNOSIS — M4726 Other spondylosis with radiculopathy, lumbar region: Secondary | ICD-10-CM | POA: Diagnosis not present

## 2019-10-26 DIAGNOSIS — Z981 Arthrodesis status: Secondary | ICD-10-CM

## 2019-10-26 DIAGNOSIS — Z4889 Encounter for other specified surgical aftercare: Secondary | ICD-10-CM

## 2019-10-26 DIAGNOSIS — G5711 Meralgia paresthetica, right lower limb: Secondary | ICD-10-CM

## 2019-10-26 NOTE — Progress Notes (Signed)
Office Visit Note   Patient: Marilyn [name]           Date of Birth: Sep 16, 1971           MRN: [id number] Visit Date: 10/26/2019              Requested by: Bridget Hartshorn, NP [place],  [place] [id number] PCP: Bridget Hartshorn, NP   Assessment & Plan: Visit Diagnoses:  1. Status post lumbar spinal fusion   2. Encounter for other specified surgical aftercare   3. Other spondylosis with radiculopathy, lumbar region   4. Other chronic pain   5. Meralgia paresthetica, right     Plan: Avoid frequent bending and stooping  No lifting greater than 10 lbs. May use ice or moist heat for pain. Weight loss is of benefit. Best medication for lumbar disc disease is arthritis medications like motrin, celebrex and naprosyn. Exercise is important to improve your indurance and does allow people to function better inspite of back pain. CT scan of lumbar spine to assess fusion healing and hardware placement.  Consider PT for exercises used to treat meralgia paresthetica.  Follow-Up Instructions: Return in about 2 weeks (around 11/09/2019).   Orders:  No orders of the defined types were placed in this encounter.  No orders of the defined types were placed in this encounter.     Procedures: No procedures performed   Clinical Data: No additional findings.   Subjective: Chief Complaint  Patient presents with  . Lower Back - Follow-up    EMG/NCS review    49 year old female with history of TLIF left L5-S1 05/2018, she had persistent pain right L5 distribution and underwent right L5-S1 laminectomy for decompression of the right L5 nerve root. Persistent pain right anterior lateral thigh and right leg EMG and NCV were done And the result of the study shows chronic bilateral L5 and S1 changes, no other nerve findings. Clinically right meralgia paresthetica.    Review of Systems  Constitutional: Negative.   HENT: Negative.   Eyes: Negative.     Respiratory: Negative.   Cardiovascular: Negative.   Endocrine: Negative.   Genitourinary: Negative.   Musculoskeletal: Positive for back pain and gait problem.  Skin: Negative.  Negative for color change, pallor, rash and wound.  Neurological: Positive for weakness and numbness. Negative for dizziness, tremors, seizures, syncope, facial asymmetry, speech difficulty, light-headedness and headaches.  Psychiatric/Behavioral: Negative for agitation, behavioral problems, confusion, decreased concentration, dysphoric mood, hallucinations, self-injury, sleep disturbance and suicidal ideas. The patient is not nervous/anxious and is not hyperactive.      Objective: Vital Signs: BP (!) 147/83 (BP Location: Left Arm, Patient Position: Sitting)   Pulse 77   Ht 5\' 3"  (1.6 m)   Wt (!) 343 lb (155.6 kg)   BMI 60.76 kg/m   Physical Exam  Ortho Exam  Specialty Comments:  No specialty comments available.  Imaging: No results found.   PMFS History: Patient Active Problem List   Diagnosis Date Noted  . Anemia due to blood loss, acute 06/18/2018    Priority: High    Class: Acute  . Superficial incisional surgical site infection 06/11/2018    Priority: High    Class: Acute  . Right ankle effusion 06/11/2018    Priority: High    Class: Acute  . Morbid (severe) obesity due to excess calories ([place]) 05/29/2018    Priority: High    Class: Chronic  . Spondylolisthesis, lumbar  region 05/27/2018    Priority: High    Class: Chronic  . Spinal stenosis of lumbar region 05/27/2018    Priority: High    Class: Chronic  . Urinary tract infection 05/27/2018    Priority: High    Class: Acute  . Plantar fasciitis of left foot 06/16/2018    Priority: Medium    Class: Chronic  . Tendonitis, Achilles, right 06/16/2018    Priority: Medium    Class: Chronic  . Osteochondral talar dome lesion 06/16/2018    Priority: Medium    Class: Chronic  . Leg pain, right 10/20/2019  . Dysesthesia 10/20/2019   . 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 Other spondylosis with radiculopathy, lumbar region   . Status post lumbar laminectomy 06/23/2019  . C. difficile colitis 05/22/2019  . Hypophosphatemia 05/21/2019  . Syncope 05/20/2019  . OSA on CPAP   . Acute gastroenteritis   . Deep incisional surgical site infection   . Fusion of spine of lumbar region 05/27/2018  . Pain of right hip joint 07/03/2017  . Acute right-sided low back pain with right-sided sciatica 03/27/2017  . Chronic right shoulder pain 02/13/2017  . History of rotator cuff tear 11/30/2016  . Impingement syndrome of right shoulder 11/30/2016  . Exacerbation of asthma 07/18/2016  . Hypokalemia 07/18/2016  . Hypertension 07/18/2016  . Depression with anxiety 07/18/2016  . Hypothyroidism 07/18/2016  . Hyperglycemia 07/18/2016  . Acute asthma exacerbation 07/18/2016  . Surgical wound dehiscence left hip; questionable superficial infection 11/10/2015  . Left hip postoperative wound infection 11/10/2015  . Osteoarthritis of left hip 09/09/2015  . 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 Obesity (BMI 35.0-39.9 without comorbidity) 04/28/2013   Past Medical History:  Diagnosis Date  . Anemia    taking iron now. pt states having no current issues 09/02/2015  . Anginal pain ([facility])    pt states experiences chest wall pain pt states related to her asthma   . Anxiety    with MRI's  . Arthritis    Everywhere  . Asthma   . Depression   . Dizziness   . GERD (gastroesophageal reflux disease)   . Headache(784.0)    HX OF MIGRAINES  . History of bronchitis   . Hypertension   . Hypothyroidism    takes levothyroxen  . OSA on CPAP    wears cpap  . Shortness of breath    with exertion  . Wears glasses     Family History  Problem Relation Age of Onset  . Cancer Mother        colon  . Epilepsy Mother   . Cancer Father        prostate  . Diabetes Father   . Hypertension Father   .  Hypertension Maternal Aunt   . Diabetes Maternal Aunt   . Hypertension Paternal Aunt     Past Surgical History:  Procedure Laterality Date  . BACK SURGERY    . CESAREAN SECTION     times 2  . CHOLECYSTECTOMY    . ENDOMETRIAL ABLATION    . INCISION AND DRAINAGE HIP Left 11/10/2015   Procedure: IRRIGATION AND DEBRIDEMENT LEFT HIP INCISION;  Surgeon: Kathryne Hitch, MD;  Location: [facility] OR;  Service: Orthopedics;  Laterality: Left;  . JOINT REPLACEMENT  2011   total left knee  . KNEE ARTHROPLASTY  04/23/2012   right   . KNEE ARTHROSCOPY    . LUMBAR LAMINECTOMY/DECOMPRESSION MICRODISCECTOMY N/A 06/23/2019  Procedure: RIGHT LUMBAR FIVE THROUGH SACRAL ONE PARTIAL HEMILAMINECTOMY WITH RIGHT LUMBAR FIVE FORAMINOTOMY;  Surgeon: Kerrin Champagne, MD;  Location: [facility] OR;  Service: Orthopedics;  Laterality: N/A;  . LUMBAR WOUND DEBRIDEMENT N/A 06/11/2018   Procedure: LUMBAR WOUND DEBRIDEMENT;  Surgeon: Kerrin Champagne, MD;  Location[facility];  Service: Orthopedics;  Laterality: N/A;  . RADIOLOGY WITH ANESTHESIA N/A 09/09/2018   Procedure: LUMBER SPINE WITHOUT CONTRAST;  Surgeon: Radiologist, Medication, MD;  Location: [facility] OR;  Service: Radiology;  Laterality: N/A;  . ROTATOR CUFF REPAIR     left   . SHOULDER SURGERY     right to repair ligament tear  . TOTAL HIP ARTHROPLASTY Left 09/09/2015   Procedure: LEFT TOTAL HIP ARTHROPLASTY ANTERIOR APPROACH;  Surgeon: Kathryne Hitch, MD;  Location: [facility];  Service: Orthopedics;  Laterality: Left;  . TOTAL KNEE ARTHROPLASTY  04/23/2012   Procedure: TOTAL KNEE ARTHROPLASTY;  Surgeon: [name], MD;  Location: [facility] OR;  Service: Orthopedics;  Laterality: Right;  Right Total Knee Arthroplasty  . TUBAL LIGATION  1996   Social History   Occupational History  . Not on file  Tobacco Use  . Smoking status: Former Smoker    Packs/day: 0.50    Years: 4.00    Pack years: 2.00    Quit date: 09/18/1991    Years since quitting: 28.1  . Smokeless tobacco:  Never Used  Substance and Sexual Activity  . Alcohol use: No  . Drug use: No  . Sexual activity: Yes    Birth control/protection: Surgical

## 2019-10-26 NOTE — Patient Instructions (Addendum)
Avoid frequent bending and stooping  No lifting greater than 10 lbs. May use ice or moist heat for pain. Weight loss is of benefit. Best medication for lumbar disc disease is arthritis medications like motrin, celebrex and naprosyn. Exercise is important to improve your indurance and does allow people to function better inspite of back pain. CT scan of lumbar spine to assess fusion healing and hardware placement.  Consider PT for exercises used to treat meralgia paresthetica.

## 2019-11-04 DIAGNOSIS — Z79891 Long term (current) use of opiate analgesic: Secondary | ICD-10-CM | POA: Insufficient documentation

## 2019-11-05 ENCOUNTER — Other Ambulatory Visit: Payer: Medicare Other

## 2019-11-13 ENCOUNTER — Ambulatory Visit
Admission: RE | Admit: 2019-11-13 | Discharge: 2019-11-13 | Disposition: A | Discharge status: Home / Self Care | Payer: Medicare Other | Source: Ambulatory Visit | Attending: Specialist | Admitting: Specialist

## 2019-11-13 DIAGNOSIS — Z4889 Encounter for other specified surgical aftercare: Secondary | ICD-10-CM

## 2019-11-16 ENCOUNTER — Encounter: Payer: Self-pay | Admitting: Specialist

## 2019-11-16 ENCOUNTER — Other Ambulatory Visit: Payer: Self-pay

## 2019-11-16 ENCOUNTER — Ambulatory Visit (INDEPENDENT_AMBULATORY_CARE_PROVIDER_SITE_OTHER): Payer: Medicare Other | Admitting: Specialist

## 2019-11-16 VITALS — BP 151/88 | HR 79 | Ht 63.0 in | Wt 360.2 lb

## 2019-11-16 DIAGNOSIS — M4326 Fusion of spine, lumbar region: Secondary | ICD-10-CM

## 2019-11-16 DIAGNOSIS — M5417 Radiculopathy, lumbosacral region: Secondary | ICD-10-CM | POA: Diagnosis not present

## 2019-11-16 DIAGNOSIS — M4726 Other spondylosis with radiculopathy, lumbar region: Secondary | ICD-10-CM

## 2019-11-16 DIAGNOSIS — G5711 Meralgia paresthetica, right lower limb: Secondary | ICD-10-CM | POA: Diagnosis not present

## 2019-11-16 DIAGNOSIS — M25551 Pain in right hip: Secondary | ICD-10-CM

## 2019-11-16 DIAGNOSIS — Z981 Arthrodesis status: Secondary | ICD-10-CM

## 2019-11-16 NOTE — Patient Instructions (Signed)
Avoid bending, stooping and avoid lifting weights greater than 10 lbs. Avoid prolong standing and walking. Avoid frequent bending and stooping  No lifting greater than 10 lbs. May use ice or moist heat for pain. Weight loss is of benefit. Handicap license is approved. Dr. Newton's secretary/Assistant will call to arrange for epidural steroid injection  

## 2019-11-16 NOTE — Progress Notes (Signed)
Office Visit Note   Patient: Marilyn [name]           Date of Birth: 02-01-1971           MRN: [id number] Visit Date: 11/16/2019              Requested by: Rebecka Apley, NP [address] [place],  [place] [id number] PCP: Rebecka Apley, NP   Assessment & Plan: Visit Diagnoses:  1. Meralgia paresthetica, right   2. Lumbosacral radiculopathy   3. Other spondylosis with radiculopathy, lumbar region   4. Fusion of lumbar spine   5. Pain in right hip   6. Status post lumbar spinal fusion     Plan: Avoid bending, stooping and avoid lifting weights greater than 10 lbs. Avoid prolong standing and walking. Avoid frequent bending and stooping  No lifting greater than 10 lbs. May use ice or moist heat for pain. Weight loss is of benefit. Handicap license is approved. Dr. [place] secretary/Assistant will call to arrange for epidural steroid injection   Follow-Up Instructions: No follow-ups on file.   Orders:  No orders of the defined types were placed in this encounter.  No orders of the defined types were placed in this encounter.     Procedures: No procedures performed   Clinical Data: Findings:  CLINICAL DATA:  Right leg pain and weakness and numbness and tingling for 2 years. Prior interbody and posterior fusion at L4-5 and L5-S1.  EXAM: CT LUMBAR SPINE WITHOUT CONTRAST  TECHNIQUE: Multidetector CT imaging of the lumbar spine was performed without intravenous contrast administration. Multiplanar CT image reconstructions were also generated.  COMPARISON:  CT myelogram dated 04/07/2018  FINDINGS: Segmentation: 5 lumbar type vertebrae.  Alignment: Normal.  Vertebrae: Solid interbody and posterior fusion at L4-5 and L5-S1. No acute abnormalities.  Paraspinal and other soft tissues: Hepatic steatosis. Otherwise negative.  Disc levels: T11-12: Normal disc. Moderate left facet arthritis.  T12-L1: Normal disc. Slight  bilateral facet arthritis.  L1-2: Normal disc. Slight bilateral facet arthritis.  L2-3: Normal disc. Minimal bilateral facet arthritis.  L3-4: Normal disc. Hypertrophy of the ligamentum flavum and facet joints creates mild spinal stenosis.  L4-5 and L5-S1: Solid Interbody and posterior fusion with posterior decompression. No appreciable neural impingement. Widely patent neural foramina.  IMPRESSION: 1. Mild spinal stenosis at L3-4. 2. Solid fusions at L4-5 and L5-S1 with no residual neural impingement. 3. Hepatic steatosis.   Electronically Signed   By: Francene Boyers M.D.   On: 11/13/2019 15:02     Subjective: Chief Complaint  Patient presents with  . Lower Back - Follow-up    CT Scan review    49 year old female with past history of lumbar fusion L4-5 and L5-S1. She had return to the OR 06/22/2020 for persistent right leg pain and discomfort and this has persisted with pain into the right lateral thigh. Clinically L4 or L3 radiculopathy. Recent MRI.    Review of Systems  Constitutional: Negative.   HENT: Negative.   Eyes: Negative.   Respiratory: Negative.   Cardiovascular: Negative.   Gastrointestinal: Negative.   Endocrine: Negative.   Genitourinary: Negative.   Musculoskeletal: Negative.   Skin: Negative.   Allergic/Immunologic: Negative.   Neurological: Negative.   Hematological: Negative.   Psychiatric/Behavioral: Negative.      Objective: Vital Signs: BP (!) 151/88 (BP Location: Left Arm, Patient Position: Sitting)   Pulse 79   Ht 5\' 3"  (1.6 m)   Wt )  360 lb 3.2 oz (163.4 kg)   BMI 63.81 kg/m   Physical Exam  Ortho Exam  Specialty Comments:  No specialty comments available.  Imaging: No results found.   PMFS History: Patient Active Problem List   Diagnosis Date Noted  . Anemia due to blood loss, acute 06/18/2018    Priority: High    Class: Acute  . Superficial incisional surgical site infection 06/11/2018    Priority:  High    Class: Acute  . Right ankle effusion 06/11/2018    Priority: High    Class: Acute  . Morbid (severe) obesity due to excess calories ([place]) 05/29/2018    Priority: High    Class: Chronic  . Spondylolisthesis, lumbar region 05/27/2018    Priority: High    Class: Chronic  . Spinal stenosis of lumbar region 05/27/2018    Priority: High    Class: Chronic  . Urinary tract infection 05/27/2018    Priority: High    Class: Acute  . Plantar fasciitis of left foot 06/16/2018    Priority: Medium    Class: Chronic  . Tendonitis, Achilles, right 06/16/2018    Priority: Medium    Class: Chronic  . Osteochondral talar dome lesion 06/16/2018    Priority: Medium    Class: Chronic  . Leg pain, right 10/20/2019  . Dysesthesia 10/20/2019  . 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 Other spondylosis with radiculopathy, lumbar region   . Status post lumbar laminectomy 06/23/2019  . C. difficile colitis 05/22/2019  . Hypophosphatemia 05/21/2019  . Syncope 05/20/2019  . OSA on CPAP   . Acute gastroenteritis   . Deep incisional surgical site infection   . Fusion of spine of lumbar region 05/27/2018  . Pain of right hip joint 07/03/2017  . Acute right-sided low back pain with right-sided sciatica 03/27/2017  . Chronic right shoulder pain 02/13/2017  . History of rotator cuff tear 11/30/2016  . Impingement syndrome of right shoulder 11/30/2016  . Exacerbation of asthma 07/18/2016  . Hypokalemia 07/18/2016  . Hypertension 07/18/2016  . Depression with anxiety 07/18/2016  . Hypothyroidism 07/18/2016  . Hyperglycemia 07/18/2016  . Acute asthma exacerbation 07/18/2016  . Surgical wound dehiscence left hip; questionable superficial infection 11/10/2015  . Left hip postoperative wound infection 11/10/2015  . Osteoarthritis of left hip 09/09/2015  . 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 Obesity (BMI 35.0-39.9 without comorbidity) 04/28/2013   Past  Medical History:  Diagnosis Date  . Anemia    taking iron now. pt states having no current issues 09/02/2015  . Anginal pain ([place])    pt states experiences chest wall pain pt states related to her asthma   . Anxiety    with MRI's  . Arthritis    Everywhere  . Asthma   . Depression   . Dizziness   . GERD (gastroesophageal reflux disease)   . Headache(784.0)    HX OF MIGRAINES  . History of bronchitis   . Hypertension   . Hypothyroidism    takes levothyroxen  . OSA on CPAP    wears cpap  . Shortness of breath    with exertion  . Wears glasses     Family History  Problem Relation Age of Onset  . Cancer Mother        colon  . Epilepsy Mother   . Cancer Father        prostate  . Diabetes Father   . Hypertension Father   .  Hypertension Maternal Aunt   . Diabetes Maternal Aunt   . Hypertension Paternal Aunt     Past Surgical History:  Procedure Laterality Date  . BACK SURGERY    . CESAREAN SECTION     times 2  . CHOLECYSTECTOMY    . ENDOMETRIAL ABLATION    . INCISION AND DRAINAGE HIP Left 11/10/2015   Procedure: IRRIGATION AND DEBRIDEMENT LEFT HIP INCISION;  Surgeon: Kathryne Hitch, MD;  Location: [facility] OR;  Service: Orthopedics;  Laterality: Left;  . JOINT REPLACEMENT  2011   total left knee  . KNEE ARTHROPLASTY  04/23/2012   right   . KNEE ARTHROSCOPY    . LUMBAR LAMINECTOMY/DECOMPRESSION MICRODISCECTOMY N/A 06/23/2019   Procedure: RIGHT LUMBAR FIVE THROUGH SACRAL ONE PARTIAL HEMILAMINECTOMY WITH RIGHT LUMBAR FIVE FORAMINOTOMY;  Surgeon: Kerrin Champagne, MD;  Location: [facility] OR;  Service: Orthopedics;  Laterality: N/A;  . LUMBAR WOUND DEBRIDEMENT N/A 06/11/2018   Procedure: LUMBAR WOUND DEBRIDEMENT;  Surgeon: Kerrin Champagne, MD;  [facility] OR;  Service: Orthopedics;  Laterality: N/A;  . RADIOLOGY WITH ANESTHESIA N/A 09/09/2018   Procedure: LUMBER SPINE WITHOUT CONTRAST;  Surgeon: Radiologist, Medication, MD;  Location: [facility] OR;  Service: Radiology;  Laterality: N/A;   . ROTATOR CUFF REPAIR     left   . SHOULDER SURGERY     right to repair ligament tear  . TOTAL HIP ARTHROPLASTY Left 09/09/2015   Procedure: LEFT TOTAL HIP ARTHROPLASTY ANTERIOR APPROACH;  Surgeon: Kathryne Hitch, MD;  Location: [facility];  Service: Orthopedics;  Laterality: Left;  . TOTAL KNEE ARTHROPLASTY  04/23/2012   Procedure: TOTAL KNEE ARTHROPLASTY;  Surgeon: [name], MD;  Location: [facility] OR;  Service: Orthopedics;  Laterality: Right;  Right Total Knee Arthroplasty  . TUBAL LIGATION  1996   Social History   Occupational History  . Not on file  Tobacco Use  . Smoking status: Former Smoker    Packs/day: 0.50    Years: 4.00    Pack years: 2.00    Quit date: 09/18/1991    Years since quitting: 28.1  . Smokeless tobacco: Never Used  Substance and Sexual Activity  . Alcohol use: No  . Drug use: No  . Sexual activity: Yes    Birth control/protection: Surgical

## 2019-11-19 ENCOUNTER — Other Ambulatory Visit (INDEPENDENT_AMBULATORY_CARE_PROVIDER_SITE_OTHER): Payer: Self-pay | Admitting: Specialist

## 2019-11-20 MED ORDER — ALLOPURINOL 100 MG PO TABS: 100.0000 mg | ORAL_TABLET | Freq: Two times a day (BID) | ORAL | 0 refills | Status: DC

## 2019-11-20 NOTE — Telephone Encounter (Signed)
JN 

## 2019-12-09 ENCOUNTER — Emergency Department (HOSPITAL_COMMUNITY): Payer: Medicare Other

## 2019-12-09 ENCOUNTER — Other Ambulatory Visit: Payer: Self-pay

## 2019-12-09 ENCOUNTER — Encounter (HOSPITAL_COMMUNITY): Payer: Self-pay | Admitting: Emergency Medicine

## 2019-12-09 ENCOUNTER — Emergency Department (HOSPITAL_COMMUNITY)
Admission: EM | Admit: 2019-12-09 | Discharge: 2019-12-09 | Disposition: A | Discharge status: Home / Self Care | Payer: Medicare Other | Attending: Emergency Medicine | Admitting: Emergency Medicine

## 2019-12-09 DIAGNOSIS — Z79899 Other long term (current) drug therapy: Secondary | ICD-10-CM | POA: Diagnosis not present

## 2019-12-09 DIAGNOSIS — Z91040 Latex allergy status: Secondary | ICD-10-CM | POA: Diagnosis not present

## 2019-12-09 DIAGNOSIS — I10 Essential (primary) hypertension: Secondary | ICD-10-CM | POA: Insufficient documentation

## 2019-12-09 DIAGNOSIS — J4541 Moderate persistent asthma with (acute) exacerbation: Secondary | ICD-10-CM | POA: Insufficient documentation

## 2019-12-09 DIAGNOSIS — Z96642 Presence of left artificial hip joint: Secondary | ICD-10-CM | POA: Diagnosis not present

## 2019-12-09 DIAGNOSIS — Z20822 Contact with and (suspected) exposure to covid-19: Secondary | ICD-10-CM | POA: Diagnosis not present

## 2019-12-09 DIAGNOSIS — Z96653 Presence of artificial knee joint, bilateral: Secondary | ICD-10-CM | POA: Insufficient documentation

## 2019-12-09 DIAGNOSIS — Z87891 Personal history of nicotine dependence: Secondary | ICD-10-CM | POA: Insufficient documentation

## 2019-12-09 DIAGNOSIS — R0602 Shortness of breath: Secondary | ICD-10-CM | POA: Diagnosis present

## 2019-12-09 LAB — CBC WITH DIFFERENTIAL/PLATELET
Abs Immature Granulocytes: 0.03 10*3/uL (ref 0.00–0.07)
Basophils Absolute: 0 10*3/uL (ref 0.0–0.1)
Basophils Relative: 0 %
Eosinophils Absolute: 0.5 10*3/uL (ref 0.0–0.5)
Eosinophils Relative: 9 %
HCT: 37.8 % (ref 36.0–46.0)
Hemoglobin: 11.2 g/dL — ABNORMAL LOW (ref 12.0–15.0)
Immature Granulocytes: 1 %
Lymphocytes Relative: 34 %
Lymphs Abs: 2.2 10*3/uL (ref 0.7–4.0)
MCH: 27.8 pg (ref 26.0–34.0)
MCHC: 29.6 g/dL — ABNORMAL LOW (ref 30.0–36.0)
MCV: 93.8 fL (ref 80.0–100.0)
Monocytes Absolute: 0.2 10*3/uL (ref 0.1–1.0)
Monocytes Relative: 4 %
Neutro Abs: 3.3 10*3/uL (ref 1.7–7.7)
Neutrophils Relative %: 52 %
Platelets: 225 10*3/uL (ref 150–400)
RBC: 4.03 MIL/uL (ref 3.87–5.11)
RDW: 14.6 % (ref 11.5–15.5)
WBC: 6.3 10*3/uL (ref 4.0–10.5)
nRBC: 0 % (ref 0.0–0.2)

## 2019-12-09 LAB — BASIC METABOLIC PANEL
Anion gap: 13 (ref 5–15)
BUN: 14 mg/dL (ref 6–20)
CO2: 26 mmol/L (ref 22–32)
Calcium: 9.1 mg/dL (ref 8.9–10.3)
Chloride: 99 mmol/L (ref 98–111)
Creatinine, Ser: 1.3 mg/dL — ABNORMAL HIGH (ref 0.44–1.00)
GFR calc Af Amer: 55 mL/min — ABNORMAL LOW (ref >60)
GFR calc non Af Amer: 48 mL/min — ABNORMAL LOW (ref >60)
Glucose, Bld: 108 mg/dL — ABNORMAL HIGH (ref 70–99)
Potassium: 3 mmol/L — ABNORMAL LOW (ref 3.5–5.1)
Sodium: 138 mmol/L (ref 135–145)

## 2019-12-09 LAB — TROPONIN I (HIGH SENSITIVITY)
Troponin I (High Sensitivity): 5 ng/L (ref <18)
Troponin I (High Sensitivity): 4 ng/L (ref <18)

## 2019-12-09 LAB — POC SARS CORONAVIRUS 2 AG -  ED: SARS Coronavirus 2 Ag: NEGATIVE

## 2019-12-09 MED ORDER — PREDNISONE 50 MG PO TABS: 50.0000 mg | ORAL_TABLET | Freq: Every day | ORAL | 0 refills | Status: AC

## 2019-12-09 MED ORDER — MAGNESIUM SULFATE 2 GM/50ML IV SOLN
2.0000 g | Freq: Once | INTRAVENOUS | Status: AC
  Administered 2019-12-09: 2 g via INTRAVENOUS
  Filled 2019-12-09: qty 50

## 2019-12-09 MED ORDER — ALBUTEROL SULFATE HFA 108 (90 BASE) MCG/ACT IN AERS
6.0000 | INHALATION_SPRAY | Freq: Once | RESPIRATORY_TRACT | Status: AC
  Administered 2019-12-09: 6 via RESPIRATORY_TRACT
  Filled 2019-12-09: qty 6.7

## 2019-12-09 MED ORDER — LIDOCAINE VISCOUS HCL 2 % MT SOLN
15.0000 mL | Freq: Once | OROMUCOSAL | Status: AC
  Administered 2019-12-09: 15 mL via ORAL
  Filled 2019-12-09: qty 15

## 2019-12-09 MED ORDER — ALBUTEROL SULFATE HFA 108 (90 BASE) MCG/ACT IN AERS
6.0000 | INHALATION_SPRAY | Freq: Once | RESPIRATORY_TRACT | Status: DC
  Filled 2019-12-09: qty 6.7

## 2019-12-09 MED ORDER — ALBUTEROL SULFATE HFA 108 (90 BASE) MCG/ACT IN AERS: 1.0000 | INHALATION_SPRAY | Freq: Four times a day (QID) | RESPIRATORY_TRACT | 0 refills | Status: AC | PRN

## 2019-12-09 MED ORDER — ALUM & MAG HYDROXIDE-SIMETH 200-200-20 MG/5ML PO SUSP
30.0000 mL | Freq: Once | ORAL | Status: AC
  Administered 2019-12-09: 30 mL via ORAL
  Filled 2019-12-09: qty 30

## 2019-12-09 MED ORDER — IOHEXOL 350 MG/ML SOLN
100.0000 mL | Freq: Once | INTRAVENOUS | Status: AC | PRN
  Administered 2019-12-09: 100 mL via INTRAVENOUS

## 2019-12-09 MED ORDER — METHYLPREDNISOLONE SODIUM SUCC 125 MG IJ SOLR
125.0000 mg | Freq: Once | INTRAMUSCULAR | Status: AC
  Administered 2019-12-09: 125 mg via INTRAVENOUS
  Filled 2019-12-09: qty 2

## 2019-12-09 NOTE — ED Notes (Signed)
Pt ambulated 95% on RA

## 2019-12-09 NOTE — Discharge Instructions (Addendum)
Take the medications as prescribed.  Return for any new or worsening symptoms. °

## 2019-12-09 NOTE — ED Triage Notes (Signed)
Patient c/o shortness of breathe with a history of asthma. Patient has used her rescue inhaler and nebulizer without resolution. Sx began on Friday of last week.

## 2019-12-09 NOTE — ED Provider Notes (Signed)
[facility] EMERGENCY DEPARTMENT Provider Note   CSN: [id number] Arrival date & time: 12/09/19  1600    History Chief Complaint  Patient presents with  . Shortness of Breath    Marilyn [name] is a 49 y.o. female with past medical history significant for chest wall pain, GERD, anemia, asthma, hypertension who presents for evaluation of shortness of breath.  Patient states she has been short of breath since Saturday.  Has been using her albuterol inhaler at home without relief.  She states she does have some mild chest pain which radiate into her back. Patient states she is coughing clear fluids.  She denies any PND orthopnea or any history of CHF.  Unknown Covid exposures.  States she has a sore throat due to coughing is much and states she is unable to talk secondary to a "raw throat."  Denies any neck pain, neck stiffness, hemoptysis or hematemesis.  Denies fever, chills, nausea, vomiting, abdominal pain, diarrhea, dysuria, lateral leg swelling, redness or warmth.  Denies additional aggravating or alleviating factors.  History obtained from patient and past medical records.  No interpreter is used.  HPI     Past Medical History:  Diagnosis Date  . Anemia    taking iron now. pt states having no current issues 09/02/2015  . Anginal pain ([place])    pt states experiences chest wall pain pt states related to her asthma   . Anxiety    with MRI's  . Arthritis    Everywhere  . Asthma   . Depression   . Dizziness   . GERD (gastroesophageal reflux disease)   . Headache(784.0)    HX OF MIGRAINES  . History of bronchitis   . Hypertension   . Hypothyroidism    takes levothyroxen  . OSA on CPAP    wears cpap  . Shortness of breath    with exertion  . Wears glasses     Patient Active Problem List   Diagnosis Date Noted  . Leg pain, right 10/20/2019  . Dysesthesia 10/20/2019  . 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 Other spondylosis with radiculopathy,  lumbar region   . Status post lumbar laminectomy 06/23/2019  . C. difficile colitis 05/22/2019  . Hypophosphatemia 05/21/2019  . Syncope 05/20/2019  . OSA on CPAP   . Acute gastroenteritis   . Anemia due to blood loss, acute 06/18/2018    Class: Acute  . Plantar fasciitis of left foot 06/16/2018    Class: Chronic  . Tendonitis, Achilles, right 06/16/2018    Class: Chronic  . Osteochondral talar dome lesion 06/16/2018    Class: Chronic  . Deep incisional surgical site infection   . Superficial incisional surgical site infection 06/11/2018    Class: Acute  . Right ankle effusion 06/11/2018    Class: Acute  . Morbid (severe) obesity due to excess calories ([place]) 05/29/2018    Class: Chronic  . Spondylolisthesis, lumbar region 05/27/2018    Class: Chronic  . Spinal stenosis of lumbar region 05/27/2018    Class: Chronic  . Urinary tract infection 05/27/2018    Class: Acute  . Fusion of spine of lumbar region 05/27/2018  . Pain of right hip joint 07/03/2017  . Acute right-sided low back pain with right-sided sciatica 03/27/2017  . Chronic right shoulder pain 02/13/2017  . History of rotator cuff tear 11/30/2016  . Impingement syndrome of right shoulder 11/30/2016  . Exacerbation of asthma 07/18/2016  . Hypokalemia 07/18/2016  . Hypertension 07/18/2016  .  Depression with anxiety 07/18/2016  . Hypothyroidism 07/18/2016  . Hyperglycemia 07/18/2016  . Acute asthma exacerbation 07/18/2016  . Surgical wound dehiscence left hip; questionable superficial infection 11/10/2015  . Left hip postoperative wound infection 11/10/2015  . Osteoarthritis of left hip 09/09/2015  . 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 Obesity (BMI 35.0-39.9 without comorbidity) 04/28/2013    Past Surgical History:  Procedure Laterality Date  . BACK SURGERY    . CESAREAN SECTION     times 2  . CHOLECYSTECTOMY    . ENDOMETRIAL ABLATION    . INCISION AND DRAINAGE HIP Left 11/10/2015    Procedure: IRRIGATION AND DEBRIDEMENT LEFT HIP INCISION;  Surgeon: Mcarthur Rossetti, MD;  Location: [place];  Service: Orthopedics;  Laterality: Left;  . JOINT REPLACEMENT  2011   total left knee  . KNEE ARTHROPLASTY  04/23/2012   right   . KNEE ARTHROSCOPY    . LUMBAR LAMINECTOMY/DECOMPRESSION MICRODISCECTOMY N/A 06/23/2019   Procedure: RIGHT LUMBAR FIVE THROUGH SACRAL ONE PARTIAL HEMILAMINECTOMY WITH RIGHT LUMBAR FIVE FORAMINOTOMY;  Surgeon: Jessy Oto, MD;  Location: [place];  Service: Orthopedics;  Laterality: N/A;  . LUMBAR WOUND DEBRIDEMENT N/A 06/11/2018   Procedure: LUMBAR WOUND DEBRIDEMENT;  Surgeon: Jessy Oto, MD;  Location: [place];  Service: Orthopedics;  Laterality: N/A;  . RADIOLOGY WITH ANESTHESIA N/A 09/09/2018   Procedure: LUMBER SPINE WITHOUT CONTRAST;  Surgeon: Radiologist, Medication, MD;  Location: [place];  Service: Radiology;  Laterality: N/A;  . ROTATOR CUFF REPAIR     left   . SHOULDER SURGERY     right to repair ligament tear  . TOTAL HIP ARTHROPLASTY Left 09/09/2015   Procedure: LEFT TOTAL HIP ARTHROPLASTY ANTERIOR APPROACH;  Surgeon: Mcarthur Rossetti, MD;  Location: [facility];  Service: Orthopedics;  Laterality: Left;  . TOTAL KNEE ARTHROPLASTY  04/23/2012   Procedure: TOTAL KNEE ARTHROPLASTY;  Surgeon: Newt Minion, MD;  Location: [place];  Service: Orthopedics;  Laterality: Right;  Right Total Knee Arthroplasty  . TUBAL LIGATION  1996     OB History    Gravida  2   Para  2   Term  2   Preterm      AB      Living  2     SAB      TAB      Ectopic      Multiple      Live Births              Family History  Problem Relation Age of Onset  . Cancer Mother        colon  . Epilepsy Mother   . Cancer Father        prostate  . Diabetes Father   . Hypertension Father   . Hypertension Maternal Aunt   . Diabetes Maternal Aunt   . Hypertension Paternal Aunt     Social History   Tobacco Use  . Smoking status: Former Smoker     Packs/day: 0.50    Years: 4.00    Pack years: 2.00    Quit date: 09/18/1991    Years since quitting: 28.2  . Smokeless tobacco: Never Used  Substance Use Topics  . Alcohol use: No  . Drug use: No    Home Medications Prior to Admission medications   Medication Sig Start Date End Date Taking? Authorizing Provider  allopurinol (ZYLOPRIM) 100 MG tablet Take 1 tablet (100 mg total) by mouth 2 (  two) times daily. 11/20/19  Yes Jessy Oto, MD  cetirizine (ZYRTEC) 10 MG tablet Take 10 mg by mouth daily.    Yes [provider]  Cholecalciferol (VITAMIN D) 50 MCG (2000 UT) CAPS Take 2,000 Units by mouth daily.   Yes [provider]  albuterol (VENTOLIN HFA) 108 (90 Base) MCG/ACT inhaler Inhale 1-2 puffs into the lungs every 6 (six) hours as needed for wheezing or shortness of breath. 12/09/19   Avaley Coop A, PA-C  ciprofloxacin (CIPRO) 500 MG tablet Take 1 tablet (500 mg total) by mouth 2 (two) times daily. Patient not taking: Reported on 12/09/2019 06/18/19   Lanae Crumbly, PA-C  cyclobenzaprine (FLEXERIL) 10 MG tablet Take 10 mg by mouth 3 (three) times daily as needed for muscle spasms.  01/22/19   [provider]  diclofenac Sodium (VOLTAREN) 1 % GEL Apply 4 g topically 4 (four) times daily as needed (pain). Patient not taking: Reported on 12/09/2019 08/20/19   Jessy Oto, MD  docusate sodium (COLACE) 100 MG capsule Take 1 capsule (100 mg total) by mouth 2 (two) times daily. 06/24/19   Jessy Oto, MD  DULoxetine (CYMBALTA) 60 MG capsule Take 60 mg by mouth daily. 05/18/19   [provider]  ferrous gluconate (FERGON) 324 MG tablet Take 1 tablet (324 mg total) by mouth 2 (two) times daily with a meal. Patient taking differently: Take 324 mg by mouth daily.  05/30/18   Jessy Oto, MD  furosemide (LASIX) 20 MG tablet Take 20 mg by mouth daily as needed for fluid.  11/01/17   [provider]  hydrochlorothiazide (HYDRODIURIL) 25 MG tablet Take 25  mg by mouth daily. 06/04/19   [provider]  HYDROcodone-acetaminophen (NORCO/VICODIN) 5-325 MG tablet Take 1 tablet by mouth every 6 (six) hours as needed for moderate pain. 08/10/19   Jessy Oto, MD  levothyroxine (SYNTHROID) 125 MCG tablet Take 125 mcg by mouth daily before breakfast.  05/18/19   [provider]  NARCAN 4 MG/0.1ML LIQD nasal spray kit Place 0.4 mg into the nose as needed (opiate overdose).  06/28/17   [provider]  ondansetron (ZOFRAN ODT) 4 MG disintegrating tablet Take 1 tablet (4 mg total) by mouth every 8 (eight) hours as needed for nausea or vomiting. 05/22/19   Roxan Hockey, MD  oxybutynin (DITROPAN XL) 15 MG 24 hr tablet Take 15 mg by mouth daily.  05/31/17   [provider]  phosphorus (K PHOS NEUTRAL) 155-852-130 MG tablet Take 2 tablets (500 mg total) by mouth 4 (four) times daily. 05/22/19   Roxan Hockey, MD  potassium chloride 20 MEQ/15ML (10%) SOLN Take 20 mEq by mouth 2 (two) times daily.    [provider]  predniSONE (DELTASONE) 50 MG tablet Take 1 tablet (50 mg total) by mouth daily for 5 days. 12/09/19 12/14/19  Yury Schaus A, PA-C  pregabalin (LYRICA) 75 MG capsule Take 1 capsule (75 mg total) by mouth 2 (two) times daily. 08/10/19   Jessy Oto, MD  vancomycin (VANCOCIN) 50 mg/mL oral solution Take 2.5 mLs (125 mg total) by mouth 4 (four) times daily. 05/22/19   Roxan Hockey, MD  vitamin B-12 (CYANOCOBALAMIN) 250 MCG tablet Take 250 mcg by mouth daily.    [provider]  zolpidem (AMBIEN) 10 MG tablet Take 10 mg by mouth at bedtime.    [provider]    Allergies    Lisinopril, Bee venom, Propoxyphene, Codeine, Latex, Meloxicam,  Morphine and related, and Tomato  Review of Systems   Review of Systems  Constitutional: Negative.   HENT: Positive for sore throat. Negative for congestion, drooling, facial swelling, nosebleeds, postnasal drip, rhinorrhea, sinus pressure, sinus  pain, sneezing, trouble swallowing and voice change.   Respiratory: Positive for cough, chest tightness and shortness of breath.   Cardiovascular: Positive for chest pain. Negative for palpitations and leg swelling.  Gastrointestinal: Negative.   Genitourinary: Negative.   Musculoskeletal: Negative.   Skin: Negative.   Neurological: Negative.   All other systems reviewed and are negative.   Physical Exam Updated Vital Signs BP 132/75   Pulse 83   Temp 98.5 F (36.9 C) (Oral)   Resp (!) 21   SpO2 100%   Physical Exam Vitals and nursing note reviewed.  Constitutional:      General: She is not in acute distress.    Appearance: She is well-developed. She is not ill-appearing, toxic-appearing or diaphoretic.  HENT:     Head: Normocephalic and atraumatic.     Jaw: There is normal jaw occlusion.     Mouth/Throat:     Lips: Pink.     Mouth: Mucous membranes are moist.     Pharynx: Oropharynx is clear. Uvula midline.     Tonsils: No tonsillar exudate or tonsillar abscesses. 0 on the right. 0 on the left.     Comments: Posterior oropharynx erythematous.  Uvula midline.  No evidence of PTA or RPA.  Mucous membranes moist. Eyes:     Pupils: Pupils are equal, round, and reactive to light.  Neck:     Comments: No neck stiffness or neck rigidity.  Very soft minimal voice. Cardiovascular:     Rate and Rhythm: Normal rate.     Pulses: Normal pulses.          Radial pulses are 2+ on the right side and 2+ on the left side.       Dorsalis pedis pulses are 2+ on the right side and 2+ on the left side.       Posterior tibial pulses are 2+ on the right side and 2+ on the left side.     Heart sounds: Normal heart sounds.  Pulmonary:     Effort: Pulmonary effort is normal. No respiratory distress.     Breath sounds: Decreased breath sounds and wheezing present.     Comments: Wheeze throughout with some decreased air movement.  No accessory muscle usage. Chest:       Comments: Chest wall  tenderness palpation diffusely to anterior chest Abdominal:     General: Bowel sounds are normal. There is no distension.     Palpations: Abdomen is soft.     Tenderness: There is no abdominal tenderness.  Musculoskeletal:        General: Normal range of motion.     Cervical back: Full passive range of motion without pain and normal range of motion.     Comments: Moves all 4 extremities without difficulty.  Compartments soft.  Bevelyn Buckles' sign negative  Skin:    General: Skin is warm and dry.     Capillary Refill: Capillary refill takes less than 2 seconds.     Comments: No edema, erythema or warmth  Neurological:     Mental Status: She is alert.     ED Results / Procedures / Treatments   Labs (all labs ordered are listed, but only abnormal results are displayed) Labs Reviewed  CBC WITH DIFFERENTIAL/PLATELET - Abnormal; Notable for  the following components:      Result Value   Hemoglobin 11.2 (*)    MCHC 29.6 (*)    All other components within normal limits  BASIC METABOLIC PANEL - Abnormal; Notable for the following components:   Potassium 3.0 (*)    Glucose, Bld 108 (*)    Creatinine, Ser 1.30 (*)    GFR calc non Af Amer 48 (*)    GFR calc Af Amer 55 (*)    All other components within normal limits  POC SARS CORONAVIRUS 2 AG -  ED  POC URINE PREG, ED  TROPONIN I (HIGH SENSITIVITY)  TROPONIN I (HIGH SENSITIVITY)    EKG None  Radiology DG Chest 2 View  Result Date: 12/09/2019 CLINICAL DATA:  SHORTNESS OF BREATH AND PERSISTANT COUGH FOR THE PAST 6 DAYS. HX: FORMER SMOKER, ASTHMA, HTN, MORBID OBESITY. EXAM: CHEST - 2 VIEW COMPARISON:  05/20/2019 FINDINGS: The heart size and mediastinal contours are within normal limits. Both lungs are clear. No pleural effusion or pneumothorax. The visualized skeletal structures are unremarkable. IMPRESSION: No active cardiopulmonary disease. Electronically Signed   By: Lajean Manes M.D.   On: 12/09/2019 17:04   CT Angio Chest/Abd/Pel for  Dissection W and/or Wo Contrast  Result Date: 12/09/2019 CLINICAL DATA:  Patient c/o shortness of breathe with a history of asthma. Patient has used her rescue inhaler and nebulizer without resolution. Sx began on Friday of last week. EXAM: CT ANGIOGRAPHY CHEST, ABDOMEN AND PELVIS TECHNIQUE: Multidetector CT imaging through the chest, abdomen and pelvis was performed using the standard protocol during bolus administration of intravenous contrast. Multiplanar reconstructed images and MIPs were obtained and reviewed to evaluate the vascular anatomy. CONTRAST:  115m OMNIPAQUE IOHEXOL 350 MG/ML SOLN COMPARISON:  08/27/2016 FINDINGS: CTA CHEST FINDINGS Cardiovascular: There is satisfactory opacification of the aorta. Aorta is normal caliber. No dissection. Minor atherosclerosis along the inferior aspect of the distal arch. Mild atherosclerosis at the origin of the left subclavian artery. No stenosis is Heart normal in size and configuration. No coronary artery calcifications. No pericardial effusion. Mediastinum/Nodes: No neck base, mediastinal or hilar masses or enlarged lymph nodes. Trachea and esophagus are unremarkable. Lungs/Pleura: Lungs are clear. No pleural effusion or pneumothorax. Musculoskeletal: No fracture or acute finding. No osteoblastic or osteolytic lesions. Review of the MIP images confirms the above findings. CTA ABDOMEN AND PELVIS FINDINGS VASCULAR Aorta: Normal caliber aorta without aneurysm, dissection, vasculitis or significant stenosis. Celiac: Patent without evidence of aneurysm, dissection, vasculitis or significant stenosis. SMA: Patent without evidence of aneurysm, dissection, vasculitis or significant stenosis. Renals: Both renal arteries are patent without evidence of aneurysm, dissection, vasculitis, fibromuscular dysplasia or significant stenosis. IMA: Faintly visualized, but patent. Inflow: Patent without evidence of aneurysm, dissection, vasculitis or significant stenosis. Veins: No  obvious venous abnormality within the limitations of this arterial phase study. Review of the MIP images confirms the above findings. NON-VASCULAR Hepatobiliary: Liver mildly enlarged. Diffuse decreased attenuation of the liver consistent with fatty infiltration. No mass or focal lesion. Status post cholecystectomy. No bile duct dilation. Pancreas: Unremarkable. No pancreatic ductal dilatation or surrounding inflammatory changes. Spleen: Normal in size without focal abnormality. Adrenals/Urinary Tract: Adrenal glands are unremarkable. Kidneys are normal, without renal calculi, focal lesion, or hydronephrosis. Bladder is unremarkable. Stomach/Bowel: Stomach is within normal limits. Appendix appears normal. No evidence of bowel wall thickening, distention, or inflammatory changes. Lymphatic: No enlarged lymph nodes. Reproductive: Uterus and bilateral adnexa are unremarkable. Other: No abdominal wall hernia or abnormality. No abdominopelvic ascites.  Musculoskeletal: Total left hip arthroplasty, well seated and aligned. Status post posterior fusion, L4, L5 and S1 with well-positioned, well-seated pedicle screws and intact interconnecting rods. No fracture. No osteoblastic or osteolytic lesions. Review of the MIP images confirms the above findings. IMPRESSION: CTA FINDINGS 1. No aortic aneurysm or dissection. 2. Minimal atherosclerosis along the distal thoracic aortic arch and mild atherosclerosis at the origin of the left subclavian artery. No stenosis. Abdominal aorta and its branch vessels are within normal limits. NON CTA FINDINGS 1. Lungs are clear. No findings to account for the patient's shortness of breath. 2. No acute findings within the abdomen or pelvis. 3. Mild hepatomegaly and extensive hepatic steatosis. Electronically Signed   By: Lajean Manes M.D.   On: 12/09/2019 19:54    Procedures Procedures (including critical care time)  Medications Ordered in ED Medications  albuterol (VENTOLIN HFA) 108 (90  Base) MCG/ACT inhaler 6 puff (6 puffs Inhalation Not Given 12/09/19 1854)  methylPREDNISolone sodium succinate (SOLU-MEDROL) 125 mg/2 mL injection 125 mg (125 mg Intravenous Given 12/09/19 1841)  iohexol (OMNIPAQUE) 350 MG/ML injection 100 mL (100 mLs Intravenous Contrast Given 12/09/19 1924)  alum & mag hydroxide-simeth (MAALOX/MYLANTA) 200-200-20 MG/5ML suspension 30 mL (30 mLs Oral Given 12/09/19 2115)    And  lidocaine (XYLOCAINE) 2 % viscous mouth solution 15 mL (15 mLs Oral Given 12/09/19 2115)  magnesium sulfate IVPB 2 g 50 mL (2 g Intravenous New Bag/Given (Non-Interop) 12/09/19 2115)  albuterol (VENTOLIN HFA) 108 (90 Base) MCG/ACT inhaler 6 puff (6 puffs Inhalation Given 12/09/19 2115)    ED Course  I have reviewed the triage vital signs and the nursing notes.  Pertinent labs & imaging results that were available during my care of the patient were reviewed by me and considered in my medical decision making (see chart for details).  49 year old female presents for evaluation of shortness of breath.  Afebrile, nonseptic, non-ill-appearing.  Has some occasional chest pain which radiates into her back.  No exertional chest pain.  Is pleuritic in nature.  No unilateral leg swelling, redness or warmth.  No tachycardia, tachypnea or hypoxia.  No known Covid exposures.  She does have some wheeze with some decreased air movement.  She has 2+ radial pulses bilaterally.  She is hypertensive.  As such we will treat as asthma exacerbation however will obtain CT dissection study given pain radiates into her back.  Labs and imaging personally reviewed: CBC leukocytosis, hemoglobin 29.9 Metabolic panel with mild elevation of glucose at 108, creatinine 1.3, similar to previous.  She does have hypokalemia at 3.0 however has been using her albuterol inhaler significantly at home.  Likely transient hypokalemia. Delta troponin negative Covid test negative EKG not pulling over into epic however no STEMI. Plain  film chest without acute cardiomegaly, infiltrates, pulmonary edema, pneumothorax CT a dissection negative  Patient reassessed.  Has continued to use.  Will give additional albuterol as well as magnesium.  Patient reassessed.  Improvement in lung sounds.  Tolerating p.o. intake without difficulty.  Patient is finishing up on her remaining magnesium.  She will need ambulate with pulse ox.  Care transferred to [facility] who will follow up on ambulation. If no hypoxia may DC home with short course of steroids.  Oncoming PA to determine ultimate disposition.   MDM Rules/Calculators/A&P                       Final Clinical Impression(s) / ED Diagnoses Final diagnoses:  Moderate persistent  asthma with exacerbation    Rx / DC Orders ED Discharge Orders         Ordered    predniSONE (DELTASONE) 50 MG tablet  Daily     12/09/19 2229    albuterol (VENTOLIN HFA) 108 (90 Base) MCG/ACT inhaler  Every 6 hours PRN     12/09/19 2229           ,  A, PA-C 12/09/19 2229    Milton Ferguson, MD 12/14/19 [phone]

## 2019-12-10 ENCOUNTER — Ambulatory Visit: Payer: Medicare Other

## 2019-12-21 ENCOUNTER — Other Ambulatory Visit (INDEPENDENT_AMBULATORY_CARE_PROVIDER_SITE_OTHER): Payer: Self-pay | Admitting: Specialist

## 2019-12-21 MED ORDER — ALLOPURINOL 100 MG PO TABS: 100.0000 mg | ORAL_TABLET | Freq: Two times a day (BID) | ORAL | 0 refills | Status: DC

## 2019-12-21 NOTE — Telephone Encounter (Signed)
JN 

## 2019-12-22 ENCOUNTER — Other Ambulatory Visit (INDEPENDENT_AMBULATORY_CARE_PROVIDER_SITE_OTHER): Payer: Self-pay | Admitting: Specialist

## 2020-01-08 ENCOUNTER — Telehealth: Payer: Self-pay | Admitting: Specialist

## 2020-01-08 NOTE — Telephone Encounter (Signed)
Patient called advised her back is still hurting pretty bad. Patient asked about the  stimulator  Dr Otelia Sergeant talked to her about

## 2020-01-08 NOTE — Telephone Encounter (Signed)
Patient called advised her back is still hurting pretty bad. Patient asked about the  stimulator  Dr Otelia Sergeant talked to her about. The number to contact patient is [phone]

## 2020-01-11 ENCOUNTER — Other Ambulatory Visit: Payer: Self-pay | Admitting: Radiology

## 2020-01-11 ENCOUNTER — Telehealth: Payer: Self-pay | Admitting: Specialist

## 2020-01-11 DIAGNOSIS — M4326 Fusion of spine, lumbar region: Secondary | ICD-10-CM

## 2020-01-11 DIAGNOSIS — M4726 Other spondylosis with radiculopathy, lumbar region: Secondary | ICD-10-CM

## 2020-01-11 DIAGNOSIS — M5417 Radiculopathy, lumbosacral region: Secondary | ICD-10-CM

## 2020-01-11 NOTE — Telephone Encounter (Signed)
Per Dr. Otelia Sergeant ok for SCS trial eval with Dr. Kyra Leyland has been placed

## 2020-01-11 NOTE — Telephone Encounter (Signed)
Patient called.   Refer to last message. Still has not heard back on the subject.

## 2020-01-11 NOTE — Telephone Encounter (Signed)
I called and advised patient that this order has been placed  

## 2020-01-11 NOTE — Telephone Encounter (Signed)
Patient is calling back regarding the SCS that they had discussed and I advised that I was waiting for Dr. Otelia Sergeant to respond as I sent him the message late Friday and he is just now back in clinic. I advised that I will let her know as soon as I find out from Dr. Otelia Sergeant

## 2020-01-13 ENCOUNTER — Other Ambulatory Visit: Payer: Self-pay | Admitting: Specialist

## 2020-01-13 DIAGNOSIS — M5416 Radiculopathy, lumbar region: Secondary | ICD-10-CM

## 2020-02-01 ENCOUNTER — Other Ambulatory Visit: Payer: Self-pay

## 2020-02-01 ENCOUNTER — Emergency Department (HOSPITAL_COMMUNITY)
Admission: EM | Admit: 2020-02-01 | Discharge: 2020-02-01 | Disposition: A | Discharge status: Home / Self Care | Payer: Medicare Other | Attending: Emergency Medicine | Admitting: Emergency Medicine

## 2020-02-01 ENCOUNTER — Emergency Department (HOSPITAL_COMMUNITY): Payer: Medicare Other

## 2020-02-01 ENCOUNTER — Encounter (HOSPITAL_COMMUNITY): Payer: Self-pay | Admitting: *Deleted

## 2020-02-01 DIAGNOSIS — S6991XA Unspecified injury of right wrist, hand and finger(s), initial encounter: Secondary | ICD-10-CM | POA: Diagnosis present

## 2020-02-01 DIAGNOSIS — Y929 Unspecified place or not applicable: Secondary | ICD-10-CM | POA: Insufficient documentation

## 2020-02-01 DIAGNOSIS — Z91040 Latex allergy status: Secondary | ICD-10-CM | POA: Diagnosis not present

## 2020-02-01 DIAGNOSIS — E039 Hypothyroidism, unspecified: Secondary | ICD-10-CM | POA: Insufficient documentation

## 2020-02-01 DIAGNOSIS — Z96652 Presence of left artificial knee joint: Secondary | ICD-10-CM | POA: Diagnosis not present

## 2020-02-01 DIAGNOSIS — W19XXXA Unspecified fall, initial encounter: Secondary | ICD-10-CM | POA: Diagnosis not present

## 2020-02-01 DIAGNOSIS — I10 Essential (primary) hypertension: Secondary | ICD-10-CM | POA: Diagnosis not present

## 2020-02-01 DIAGNOSIS — Z96651 Presence of right artificial knee joint: Secondary | ICD-10-CM | POA: Insufficient documentation

## 2020-02-01 DIAGNOSIS — J45909 Unspecified asthma, uncomplicated: Secondary | ICD-10-CM | POA: Insufficient documentation

## 2020-02-01 DIAGNOSIS — Y939 Activity, unspecified: Secondary | ICD-10-CM | POA: Insufficient documentation

## 2020-02-01 DIAGNOSIS — Z96642 Presence of left artificial hip joint: Secondary | ICD-10-CM | POA: Diagnosis not present

## 2020-02-01 DIAGNOSIS — S63501A Unspecified sprain of right wrist, initial encounter: Secondary | ICD-10-CM | POA: Insufficient documentation

## 2020-02-01 DIAGNOSIS — Y998 Other external cause status: Secondary | ICD-10-CM | POA: Diagnosis not present

## 2020-02-01 DIAGNOSIS — Z87891 Personal history of nicotine dependence: Secondary | ICD-10-CM | POA: Insufficient documentation

## 2020-02-01 DIAGNOSIS — Z79899 Other long term (current) drug therapy: Secondary | ICD-10-CM | POA: Insufficient documentation

## 2020-02-01 MED ORDER — DICLOFENAC SODIUM 50 MG PO TBEC: 50.0000 mg | DELAYED_RELEASE_TABLET | Freq: Two times a day (BID) | ORAL | 0 refills | Status: DC

## 2020-02-01 NOTE — Discharge Instructions (Signed)
Return if any problems.

## 2020-02-01 NOTE — ED Provider Notes (Signed)
[facility] EMERGENCY DEPARTMENT Provider Note   CSN: [id number] Arrival date & time: 02/01/20  1426     History Chief Complaint  Patient presents with  . Wrist Pain    Marilyn [name] is a 49 y.o. female.  The history is provided by the patient. No language interpreter was used.  Wrist Pain This is a new problem. The problem occurs constantly. The problem has been gradually worsening. Nothing aggravates the symptoms. Nothing relieves the symptoms. She has tried nothing for the symptoms. The treatment provided no relief.   Pt reports her wrist became painful after a fall a week ago.  Pt reports she hit wrist on ground     Past Medical History:  Diagnosis Date  . Anemia    taking iron now. pt states having no current issues 09/02/2015  . Anginal pain ([place])    pt states experiences chest wall pain pt states related to her asthma   . Anxiety    with MRI's  . Arthritis    Everywhere  . Asthma   . Depression   . Dizziness   . GERD (gastroesophageal reflux disease)   . Headache(784.0)    HX OF MIGRAINES  . History of bronchitis   . Hypertension   . Hypothyroidism    takes levothyroxen  . OSA on CPAP    wears cpap  . Shortness of breath    with exertion  . Wears glasses     Patient Active Problem List   Diagnosis Date Noted  . Leg pain, right 10/20/2019  . Dysesthesia 10/20/2019  . 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 Other spondylosis with radiculopathy, lumbar region   . Status post lumbar laminectomy 06/23/2019  . C. difficile colitis 05/22/2019  . Hypophosphatemia 05/21/2019  . Syncope 05/20/2019  . OSA on CPAP   . Acute gastroenteritis   . Anemia due to blood loss, acute 06/18/2018    Class: Acute  . Plantar fasciitis of left foot 06/16/2018    Class: Chronic  . Tendonitis, Achilles, right 06/16/2018    Class: Chronic  . Osteochondral talar dome lesion 06/16/2018    Class: Chronic  . Deep incisional surgical site infection    . Superficial incisional surgical site infection 06/11/2018    Class: Acute  . Right ankle effusion 06/11/2018    Class: Acute  . Morbid (severe) obesity due to excess calories ([place]) 05/29/2018    Class: Chronic  . Spondylolisthesis, lumbar region 05/27/2018    Class: Chronic  . Spinal stenosis of lumbar region 05/27/2018    Class: Chronic  . Urinary tract infection 05/27/2018    Class: Acute  . Fusion of spine of lumbar region 05/27/2018  . Pain of right hip joint 07/03/2017  . Acute right-sided low back pain with right-sided sciatica 03/27/2017  . Chronic right shoulder pain 02/13/2017  . History of rotator cuff tear 11/30/2016  . Impingement syndrome of right shoulder 11/30/2016  . Exacerbation of asthma 07/18/2016  . Hypokalemia 07/18/2016  . Hypertension 07/18/2016  . Depression with anxiety 07/18/2016  . Hypothyroidism 07/18/2016  . Hyperglycemia 07/18/2016  . Acute asthma exacerbation 07/18/2016  . Surgical wound dehiscence left hip; questionable superficial infection 11/10/2015  . Left hip postoperative wound infection 11/10/2015  . Osteoarthritis of left hip 09/09/2015  . 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 Obesity (BMI 35.0-39.9 without comorbidity) 04/28/2013    Past Surgical History:  Procedure Laterality Date  . BACK SURGERY    .  CESAREAN SECTION     times 2  . CHOLECYSTECTOMY    . ENDOMETRIAL ABLATION    . INCISION AND DRAINAGE HIP Left 11/10/2015   Procedure: IRRIGATION AND DEBRIDEMENT LEFT HIP INCISION;  Surgeon: Mcarthur Rossetti, MD;  Location: [place];  Service: Orthopedics;  Laterality: Left;  . JOINT REPLACEMENT  2011   total left knee  . KNEE ARTHROPLASTY  04/23/2012   right   . KNEE ARTHROSCOPY    . LUMBAR LAMINECTOMY/DECOMPRESSION MICRODISCECTOMY N/A 06/23/2019   Procedure: RIGHT LUMBAR FIVE THROUGH SACRAL ONE PARTIAL HEMILAMINECTOMY WITH RIGHT LUMBAR FIVE FORAMINOTOMY;  Surgeon: Jessy Oto, MD;  Location: [place];   Service: Orthopedics;  Laterality: N/A;  . LUMBAR WOUND DEBRIDEMENT N/A 06/11/2018   Procedure: LUMBAR WOUND DEBRIDEMENT;  Surgeon: Jessy Oto, MD;  Location: [place];  Service: Orthopedics;  Laterality: N/A;  . RADIOLOGY WITH ANESTHESIA N/A 09/09/2018   Procedure: LUMBER SPINE WITHOUT CONTRAST;  Surgeon: Radiologist, Medication, MD;  Location: [place];  Service: Radiology;  Laterality: N/A;  . ROTATOR CUFF REPAIR     left   . SHOULDER SURGERY     right to repair ligament tear  . TOTAL HIP ARTHROPLASTY Left 09/09/2015   Procedure: LEFT TOTAL HIP ARTHROPLASTY ANTERIOR APPROACH;  Surgeon: Mcarthur Rossetti, MD;  Location: [facility];  Service: Orthopedics;  Laterality: Left;  . TOTAL KNEE ARTHROPLASTY  04/23/2012   Procedure: TOTAL KNEE ARTHROPLASTY;  Surgeon: Newt Minion, MD;  Location: [facility];  Service: Orthopedics;  Laterality: Right;  Right Total Knee Arthroplasty  . TUBAL LIGATION  1996     OB History    Gravida  2   Para  2   Term  2   Preterm      AB      Living  2     SAB      TAB      Ectopic      Multiple      Live Births              Family History  Problem Relation Age of Onset  . Cancer Mother        colon  . Epilepsy Mother   . Cancer Father        prostate  . Diabetes Father   . Hypertension Father   . Hypertension Maternal Aunt   . Diabetes Maternal Aunt   . Hypertension Paternal Aunt     Social History   Tobacco Use  . Smoking status: Former Smoker    Packs/day: 0.50    Years: 4.00    Pack years: 2.00    Quit date: 09/18/1991    Years since quitting: 28.3  . Smokeless tobacco: Never Used  Substance Use Topics  . Alcohol use: No  . Drug use: No    Home Medications Prior to Admission medications   Medication Sig Start Date End Date Taking? Authorizing Provider  albuterol (VENTOLIN HFA) 108 (90 Base) MCG/ACT inhaler Inhale 1-2 puffs into the lungs every 6 (six) hours as needed for wheezing or shortness of breath. 12/09/19    Henderly, Britni A, PA-C  allopurinol (ZYLOPRIM) 100 MG tablet Take 1 tablet by mouth twice daily 12/22/19   Jessy Oto, MD  cetirizine (ZYRTEC) 10 MG tablet Take 10 mg by mouth daily.     [provider]  Cholecalciferol (VITAMIN D) 50 MCG (2000 UT) CAPS Take 2,000 Units by mouth daily.    [provider]  ciprofloxacin (CIPRO) 500 MG tablet Take 1 tablet (500 mg total) by mouth 2 (two) times daily. Patient not taking: Reported on 12/09/2019 06/18/19   Lanae Crumbly, PA-C  cyclobenzaprine (FLEXERIL) 10 MG tablet Take 10 mg by mouth 3 (three) times daily as needed for muscle spasms.  01/22/19   [provider]  diclofenac Sodium (VOLTAREN) 1 % GEL Apply 4 g topically 4 (four) times daily as needed (pain). Patient not taking: Reported on 12/09/2019 08/20/19   Jessy Oto, MD  docusate sodium (COLACE) 100 MG capsule Take 1 capsule (100 mg total) by mouth 2 (two) times daily. 06/24/19   Jessy Oto, MD  DULoxetine (CYMBALTA) 60 MG capsule Take 60 mg by mouth daily. 05/18/19   [provider]  ferrous gluconate (FERGON) 324 MG tablet Take 1 tablet (324 mg total) by mouth 2 (two) times daily with a meal. Patient taking differently: Take 324 mg by mouth daily.  05/30/18   Jessy Oto, MD  furosemide (LASIX) 20 MG tablet Take 20 mg by mouth daily as needed for fluid.  11/01/17   [provider]  hydrochlorothiazide (HYDRODIURIL) 25 MG tablet Take 25 mg by mouth daily. 06/04/19   [provider]  HYDROcodone-acetaminophen (NORCO/VICODIN) 5-325 MG tablet Take 1 tablet by mouth every 6 (six) hours as needed for moderate pain. 08/10/19   Jessy Oto, MD  levothyroxine (SYNTHROID) 125 MCG tablet Take 125 mcg by mouth daily before breakfast.  05/18/19   [provider]  NARCAN 4 MG/0.1ML LIQD nasal spray kit Place 0.4 mg into the nose as needed (opiate overdose).  06/28/17   [provider]  ondansetron (ZOFRAN ODT) 4 MG disintegrating  tablet Take 1 tablet (4 mg total) by mouth every 8 (eight) hours as needed for nausea or vomiting. 05/22/19   Roxan Hockey, MD  oxybutynin (DITROPAN XL) 15 MG 24 hr tablet Take 15 mg by mouth daily.  05/31/17   [provider]  phosphorus (K PHOS NEUTRAL) 155-852-130 MG tablet Take 2 tablets (500 mg total) by mouth 4 (four) times daily. 05/22/19   Roxan Hockey, MD  potassium chloride 20 MEQ/15ML (10%) SOLN Take 20 mEq by mouth 2 (two) times daily.    [provider]  pregabalin (LYRICA) 75 MG capsule Take 1 capsule (75 mg total) by mouth 2 (two) times daily. 08/10/19   Jessy Oto, MD  vancomycin (VANCOCIN) 50 mg/mL oral solution Take 2.5 mLs (125 mg total) by mouth 4 (four) times daily. 05/22/19   Roxan Hockey, MD  vitamin B-12 (CYANOCOBALAMIN) 250 MCG tablet Take 250 mcg by mouth daily.    [provider]  zolpidem (AMBIEN) 10 MG tablet Take 10 mg by mouth at bedtime.    [provider]    Allergies    Lisinopril, Bee venom, Propoxyphene, Codeine, Latex, Meloxicam, Morphine and related, and Tomato  Review of Systems   Review of Systems  All other systems reviewed and are negative.   Physical Exam Updated Vital Signs BP (!) 153/88 (BP Location: Left Arm)   Pulse 80   Temp 98.1 F (36.7 C) (Oral)   Resp 20   Ht '5\' 3"'  (1.6 m)   Wt (!) 163.7 kg   SpO2 96%   BMI 63.95 kg/m   Physical Exam Vitals and nursing note reviewed.  Constitutional:      Appearance: She is well-developed.  HENT:     Head: Normocephalic.  Cardiovascular:     Rate and  Rhythm: Normal rate.  Pulmonary:     Effort: Pulmonary effort is normal.  Abdominal:     General: There is no distension.  Musculoskeletal:        General: Tenderness present. Normal range of motion.     Comments: Tender right wrist and right thumb  Skin:    General: Skin is warm.  Neurological:     General: No focal deficit present.     Mental Status: She is alert and oriented to person,  place, and time.  Psychiatric:        Mood and Affect: Mood normal.     ED Results / Procedures / Treatments   Labs (all labs ordered are listed, but only abnormal results are displayed) Labs Reviewed - No data to display  EKG None  Radiology No results found.  Procedures Procedures (including critical care time)  Medications Ordered in ED Medications - No data to display  ED Course  I have reviewed the triage vital signs and the nursing notes.  Pertinent labs & imaging results that were available during my care of the patient were reviewed by me and considered in my medical decision making (see chart for details).    MDM Rules/Calculators/A&P                      MDM:  Pt advised to continue to wear splint.  Follow up with Dr. Aline Brochure for evatluion  Final Clinical Impression(s) / ED Diagnoses Final diagnoses:  Sprain of right wrist, initial encounter    Rx / DC Orders ED Discharge Orders    None    An After Visit Summary was printed and given to the patient.    Fransico Meadow, [place] 02/01/20 1624    Margette Fast, MD 02/01/20 [phone]

## 2020-02-01 NOTE — ED Triage Notes (Signed)
Pt c/o right wrist pain that has gotten progressively worse over the last couple of weeks after a fall;

## 2020-02-01 NOTE — ED Notes (Addendum)
Port right wrist DG done.

## 2020-02-02 ENCOUNTER — Encounter: Payer: Self-pay | Admitting: Physical Medicine and Rehabilitation

## 2020-02-02 ENCOUNTER — Ambulatory Visit (INDEPENDENT_AMBULATORY_CARE_PROVIDER_SITE_OTHER): Payer: Medicare Other | Admitting: Physical Medicine and Rehabilitation

## 2020-02-02 VITALS — BP 138/91 | Ht 63.0 in | Wt 360.0 lb

## 2020-02-02 DIAGNOSIS — M961 Postlaminectomy syndrome, not elsewhere classified: Secondary | ICD-10-CM

## 2020-02-02 DIAGNOSIS — G894 Chronic pain syndrome: Secondary | ICD-10-CM | POA: Diagnosis not present

## 2020-02-02 DIAGNOSIS — M5416 Radiculopathy, lumbar region: Secondary | ICD-10-CM | POA: Diagnosis not present

## 2020-02-02 NOTE — Progress Notes (Signed)
 .  Numeric Pain Rating Scale and Functional Assessment Average Pain 4 Pain Right Now 3 My pain is intermittent, constant, stabbing and aching Pain is worse with: walking and standing Pain improves with: medication   In the last MONTH (on 0-10 scale) has pain interfered with the following?  1. General activity like being  able to carry out your everyday physical activities such as walking, climbing stairs, carrying groceries, or moving a chair?  Rating(8)  2. Relation with others like being able to carry out your usual social activities and roles such as  activities at home, at work and in your community. Rating(8)  3. Enjoyment of life such that you have  been bothered by emotional problems such as feeling anxious, depressed or irritable?  Rating(5)

## 2020-02-08 ENCOUNTER — Other Ambulatory Visit: Payer: Self-pay | Admitting: Physical Medicine and Rehabilitation

## 2020-02-08 ENCOUNTER — Ambulatory Visit: Payer: Medicare Other | Admitting: Physician Assistant

## 2020-02-08 ENCOUNTER — Encounter: Payer: Self-pay | Admitting: Physical Medicine and Rehabilitation

## 2020-02-08 DIAGNOSIS — M5416 Radiculopathy, lumbar region: Secondary | ICD-10-CM

## 2020-02-08 DIAGNOSIS — M961 Postlaminectomy syndrome, not elsewhere classified: Secondary | ICD-10-CM

## 2020-02-08 DIAGNOSIS — G894 Chronic pain syndrome: Secondary | ICD-10-CM

## 2020-02-08 NOTE — Progress Notes (Unsigned)
amb ref t 

## 2020-02-08 NOTE — Progress Notes (Unsigned)
amb ref °

## 2020-02-08 NOTE — Progress Notes (Signed)
Marilyn [name] - 49 y.o. female MRN [id number]  Date of birth: July 14, 1971  Office Visit Note: Visit Date: 02/02/2020 PCP: Rebecka Apley, NP Referred by: Rebecka Apley, NP  Subjective: Chief Complaint  Patient presents with  . Lower Back - Pain  . Right Thigh - Pain   HPI: Marilyn [name] is a 49 y.o. female who comes in today For evaluation management and consultation for possible spinal cord stimulator trial as requested by Dr. Vira Browns.  The patient is status post L4-5 and L5-S1 TLIF in 2019 and then subsequent decompression foraminotomy at L5 in 2020.  She is also status post left total hip arthroplasty by Dr. Doneen Poisson.  She has also had bilateral total knee arthroplasties.  She has had shoulder surgery in the past as well.  She reports pain in the lower back that can radiate into the right thigh.  She reports this as severe pain and this started about a year ago.  Hip injection was not very diagnostic and she does have some degenerative changes of the right hip.  She reports worse with walking and really a significant limitation with her activities of daily living.  She reports that pain is intermittent constant stabbing pain.  This is recalcitrant back and hip and leg pain status post 2 lumbar surgeries as well as medication management and physical therapy and activity modification.  Her case is complicated by morbid obesity.  She has not been tolerant to pain medication with various intolerances and allergies to codeine as well as morphine related products.  She has difficulty with nonsteroidal anti-inflammatories as well.  She does take Lyrica and diclofenac.  She has had multiple injections of the lumbar spine without any lasting relief.  Review of Systems  Musculoskeletal: Positive for back pain.       Right hip and leg pain  All other systems reviewed and are negative.  Otherwise per HPI.  Assessment & Plan: Visit Diagnoses:  1. Lumbar  radiculopathy   2. Post laminectomy syndrome   3. Chronic pain syndrome     Plan: Findings:  Chronic recalcitrant severe low back and radicular leg pain on the right with history of lumbar fusion and foraminotomy and also history of multiple joint arthroplasties including the left hip and bilateral knees.  Her case is complicated by morbid obesity and some drug intolerance to this.  Ordinarily I would think should be a great candidate for spinal cord stimulator trial and we went over this at length with her and spent 30 minutes talking about her condition and options.  She is really failed all manner of other conservative and nonconservative care.  The main issue is her body weight at this point in terms of doing the procedure itself.  I think the next step if she desires to pursue it would be to go ahead and get free trial and free implantation psychological evaluation probably by Dr. Chestine Spore at [facility].  I would probably get a consultation with Dr. Odette Fraction at [facility] to see if he could do this potentially at the surgical center.  Alternatively could have her seen by [facility] in [facility].    Meds & Orders: No orders of the defined types were placed in this encounter.   Orders Placed This Encounter  Procedures  . Ambulatory referral to Neuropsychology    Follow-up: Return for Referral for 3 spinal cord trial psychological evaluation.   Procedures: No procedures  performed  No notes on file   Clinical History: CT Lumbar IMPRESSION: 1. Mild spinal stenosis at L3-4. 2. Solid fusions at L4-5 and L5-S1 with no residual neural impingement. 3. Hepatic steatosis.   Electronically Signed   By: Francene Boyers M.D.   On: 11/13/2019 15:02 ---  MRI LUMBAR SPINE WITHOUT CONTRAST  IMPRESSION:  Status post L4-S1 TLIF. No significant postoperative extra-spinal  fluid collection. No features concerning for discitis/osteomyelitis.      Slight inferior L4 endplate T2 and STIR hyperintensity, favored to  represent early cage subsidence. Continued surveillance is  warranted.    Improved stenosis and subarticular zone narrowing at L4-5 and L5-S1.  Correlate clinically for residual symptomatic RIGHT-sided foraminal  narrowing at either or both levels.    BILATERAL foraminal narrowing is observed at L3-L4, without interval  changes of disc protrusion/adjacent segment disease. Either L3 nerve  root could be affected.     Electronically Signed  By: Elsie Stain M.D.  On: 09/09/2018 10:00  --  MR OF THE RIGHT HIP WITHOUT CONTRAST      FINDINGS:  Bones: Prior left hip arthroplasty with susceptibility artifact  partially obscuring the adjacent soft tissue and osseous structures.  No periarticular fluid collection or osteolysis.    No right hip fracture, dislocation or avascular necrosis. Superior  and inferior pubic rami are intact. No focal marrow signal  abnormality. Normal sacrum and sacroiliac joints.    Articular cartilage and labrum    Articular cartilage: Chondromalacia of the right hip.    Labrum: Severe superior anterior right labral degeneration with a  tear.    Joint or bursal effusion    Joint effusion: No joint effusion.    Bursae: No bursa formation.    Muscles and tendons    Flexors: Normal.    Extensors: Normal.    Abductors: Normal.    Adductors: Normal.    Rotators: Normal.    Hamstrings: Normal.    Other findings    Miscellaneous: No fluid collection or hematoma. No pelvic free  fluid. Postsurgical changes in the soft tissues overlying the left  hip.    IMPRESSION:  1. Severe superior anterior right labral degeneration with a tear.  2. Chondromalacia of the right hip.  3. Prior left hip arthroplasty with susceptibility artifact  partially obscuring the adjacent soft tissue and osseous structures.  No periarticular fluid collection or  osteolysis.      Electronically Signed  By: Elige Ko  On: 01/29/2016 13:23   She reports that she quit smoking about 28 years ago. She has a 2.00 pack-year smoking history. She has never used smokeless tobacco. No results for input(s): HGBA1C, LABURIC in the last 8760 hours.  Objective:  VS:  HT:5\' 3"  (160 cm)   WT:(!) 360 lb (163.3 kg)  BMI:63.79    BP:(!) 138/91  HR: bpm  TEMP: ( )  RESP:  Physical Exam Vitals and nursing note reviewed.  Constitutional:      General: She is not in acute distress.    Appearance: Normal appearance. She is well-developed. She is obese.  HENT:     Head: Normocephalic and atraumatic.     Nose: Nose normal.     Mouth/Throat:     Mouth: Mucous membranes are moist.     Pharynx: Oropharynx is clear.  Eyes:     Conjunctiva/sclera: Conjunctivae normal.     Pupils: Pupils are equal, round, and reactive to light.  Cardiovascular:     Rate and Rhythm: Regular  rhythm.  Pulmonary:     Effort: Pulmonary effort is normal. No respiratory distress.  Abdominal:     General: There is no distension.     Palpations: Abdomen is soft.     Tenderness: There is no guarding.  Musculoskeletal:     Cervical back: Normal range of motion and neck supple.     Right lower leg: No edema.     Left lower leg: No edema.     Comments: Patient has difficulty going from sit to stand with full extension.  She is stiff through the lumbar spine.  She does have pain over the right PSIS but not over the greater trochanter.  No pain with hip rotation on the right.  She has good distal strength bilaterally.  No clonus bilaterally.  Skin:    General: Skin is warm and dry.     Findings: No erythema or rash.  Neurological:     General: No focal deficit present.     Mental Status: She is alert and oriented to person, place, and time.     Sensory: No sensory deficit.     Motor: No weakness or abnormal muscle tone.     Coordination: Coordination normal.     Gait: Gait  normal.  Psychiatric:        Mood and Affect: Mood normal.        Behavior: Behavior normal.        Thought Content: Thought content normal.     Ortho Exam  Imaging: No results found.  Past Medical/Family/Surgical/Social History: Medications & Allergies reviewed per EMR, new medications updated. Patient Active Problem List   Diagnosis Date Noted  . Leg pain, right 10/20/2019  . Dysesthesia 10/20/2019  . 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 Other spondylosis with radiculopathy, lumbar region   . Status post lumbar laminectomy 06/23/2019  . C. difficile colitis 05/22/2019  . Hypophosphatemia 05/21/2019  . Syncope 05/20/2019  . OSA on CPAP   . Acute gastroenteritis   . Anemia due to blood loss, acute 06/18/2018    Class: Acute  . Plantar fasciitis of left foot 06/16/2018    Class: Chronic  . Tendonitis, Achilles, right 06/16/2018    Class: Chronic  . Osteochondral talar dome lesion 06/16/2018    Class: Chronic  . Deep incisional surgical site infection   . Superficial incisional surgical site infection 06/11/2018    Class: Acute  . Right ankle effusion 06/11/2018    Class: Acute  . Morbid (severe) obesity due to excess calories (HCC) 05/29/2018    Class: Chronic  . Spondylolisthesis, lumbar region 05/27/2018    Class: Chronic  . Spinal stenosis of lumbar region 05/27/2018    Class: Chronic  . Urinary tract infection 05/27/2018    Class: Acute  . Fusion of spine of lumbar region 05/27/2018  . Pain of right hip joint 07/03/2017  . Acute right-sided low back pain with right-sided sciatica 03/27/2017  . Chronic right shoulder pain 02/13/2017  . History of rotator cuff tear 11/30/2016  . Impingement syndrome of right shoulder 11/30/2016  . Exacerbation of asthma 07/18/2016  . Hypokalemia 07/18/2016  . Hypertension 07/18/2016  . Depression with anxiety 07/18/2016  . Hypothyroidism 07/18/2016  . Hyperglycemia 07/18/2016  . Acute asthma  exacerbation 07/18/2016  . Surgical wound dehiscence left hip; questionable superficial infection 11/10/2015  . Left hip postoperative wound infection 11/10/2015  . Osteoarthritis of left hip 09/09/2015  . 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  Obesity (BMI 35.0-39.9 without comorbidity) 04/28/2013   Past Medical History:  Diagnosis Date  . Anemia    taking iron now. pt states having no current issues 09/02/2015  . Anginal pain ([place])    pt states experiences chest wall pain pt states related to her asthma   . Anxiety    with MRI's  . Arthritis    Everywhere  . Asthma   . Depression   . Dizziness   . GERD (gastroesophageal reflux disease)   . Headache(784.0)    HX OF MIGRAINES  . History of bronchitis   . Hypertension   . Hypothyroidism    takes levothyroxen  . OSA on CPAP    wears cpap  . Shortness of breath    with exertion  . Wears glasses    Family History  Problem Relation Age of Onset  . Cancer Mother        colon  . Epilepsy Mother   . Cancer Father        prostate  . Diabetes Father   . Hypertension Father   . Hypertension Maternal Aunt   . Diabetes Maternal Aunt   . Hypertension Paternal Aunt    Past Surgical History:  Procedure Laterality Date  . BACK SURGERY    . CESAREAN SECTION     times 2  . CHOLECYSTECTOMY    . ENDOMETRIAL ABLATION    . INCISION AND DRAINAGE HIP Left 11/10/2015   Procedure: IRRIGATION AND DEBRIDEMENT LEFT HIP INCISION;  Surgeon: Mcarthur Rossetti, MD;  Location: [place];  Service: Orthopedics;  Laterality: Left;  . JOINT REPLACEMENT  2011   total left knee  . KNEE ARTHROPLASTY  04/23/2012   right   . KNEE ARTHROSCOPY    . LUMBAR LAMINECTOMY/DECOMPRESSION MICRODISCECTOMY N/A 06/23/2019   Procedure: RIGHT LUMBAR FIVE THROUGH SACRAL ONE PARTIAL HEMILAMINECTOMY WITH RIGHT LUMBAR FIVE FORAMINOTOMY;  Surgeon: Jessy Oto, MD;  Location: [place];  Service: Orthopedics;  Laterality: N/A;  . LUMBAR WOUND DEBRIDEMENT  N/A 06/11/2018   Procedure: LUMBAR WOUND DEBRIDEMENT;  Surgeon: Jessy Oto, MD;  Location: [place];  Service: Orthopedics;  Laterality: N/A;  . RADIOLOGY WITH ANESTHESIA N/A 09/09/2018   Procedure: LUMBER SPINE WITHOUT CONTRAST;  Surgeon: Radiologist, Medication, MD;  Location: [place];  Service: Radiology;  Laterality: N/A;  . ROTATOR CUFF REPAIR     left   . SHOULDER SURGERY     right to repair ligament tear  . TOTAL HIP ARTHROPLASTY Left 09/09/2015   Procedure: LEFT TOTAL HIP ARTHROPLASTY ANTERIOR APPROACH;  Surgeon: Mcarthur Rossetti, MD;  Location: [facility];  Service: Orthopedics;  Laterality: Left;  . TOTAL KNEE ARTHROPLASTY  04/23/2012   Procedure: TOTAL KNEE ARTHROPLASTY;  Surgeon: Newt Minion, MD;  Location: [place];  Service: Orthopedics;  Laterality: Right;  Right Total Knee Arthroplasty  . TUBAL LIGATION  1996   Social History   Occupational History  . Not on file  Tobacco Use  . Smoking status: Former Smoker    Packs/day: 0.50    Years: 4.00    Pack years: 2.00    Quit date: 09/18/1991    Years since quitting: 28.4  . Smokeless tobacco: Never Used  Substance and Sexual Activity  . Alcohol use: No  . Drug use: No  . Sexual activity: Yes    Birth control/protection: Surgical

## 2020-02-10 ENCOUNTER — Other Ambulatory Visit: Payer: Self-pay

## 2020-02-10 ENCOUNTER — Ambulatory Visit (INDEPENDENT_AMBULATORY_CARE_PROVIDER_SITE_OTHER): Payer: Medicare Other | Admitting: Physician Assistant

## 2020-02-10 ENCOUNTER — Encounter: Payer: Self-pay | Admitting: Physician Assistant

## 2020-02-10 DIAGNOSIS — M654 Radial styloid tenosynovitis [de Quervain]: Secondary | ICD-10-CM

## 2020-02-10 NOTE — Progress Notes (Signed)
Office Visit Note   Patient: Marilyn [name]           Date of Birth: 09-24-1970           MRN: [id number] Visit Date: 02/10/2020              Requested by: Rebecka Apley, NP [address] [place],  [place] [address] PCP: Rebecka Apley, NP   Assessment & Plan: Visit Diagnoses:  1. De Quervain's disease (radial styloid tenosynovitis)     Plan: Patient has had her first Covid vaccine injection is due for the second 1 on June 14.  Therefore we will wait 10 to 14 days after the second injection before injecting the first extensor compartment.  In the interim she will use Voltaren gel over the first extensor compartment of her right hand and continue to use the thumb spica splint.  See her back around June 24 for possible injection.  Follow-Up Instructions: Return in about 29 days (around 03/10/2020).   Orders:  No orders of the defined types were placed in this encounter.  No orders of the defined types were placed in this encounter.     Procedures: No procedures performed   Clinical Data: No additional findings.   Subjective: Chief Complaint  Patient presents with  . Right Wrist - Pain    HPI [name] comes in today with recent increased right wrist pain.  She reports that she fell on May 4 while at [place].  She has had increased wrist pain since that time.  She is wearing a Velcro wrist splint both thumb spica type and distal wrist splint and finds it is of some benefit.  She did present to the ER on 02/01/2020 at [facility] and radiographs of her wrist were performed.  There was no acute fracture.  No findings on the films I personally reviewed these.  Procedure in the past.  Suzette Battiest involving the right wrist she states that the injection in the past has helped.  She is unsure if she actually injured her wrist during the fall but that is the only thing she can think of which is causing the pain to increase.  Review of Systems See  HPI.  Objective: Vital Signs: There were no vitals taken for this visit.  Physical Exam Constitutional:      Appearance: She is not ill-appearing or diaphoretic.  Pulmonary:     Effort: Pulmonary effort is normal.  Neurological:     Mental Status: She is alert and oriented to person, place, and time.  Psychiatric:        Mood and Affect: Mood normal.     Ortho Exam Right wrist no ecchymosis erythema or edema.  She has tenderness over the first extensor compartment.  Grind test negative.  Positive Finkelstein's.  Remainder of the hand is nontender. Specialty Comments:  No specialty comments available.  Imaging: No results found.   PMFS History: Patient Active Problem List   Diagnosis Date Noted  . Leg pain, right 10/20/2019  . Dysesthesia 10/20/2019  . 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 Other spondylosis with radiculopathy, lumbar region   . Status post lumbar laminectomy 06/23/2019  . C. difficile colitis 05/22/2019  . Hypophosphatemia 05/21/2019  . Syncope 05/20/2019  . OSA on CPAP   . Acute gastroenteritis   . Anemia due to blood loss, acute 06/18/2018    Class: Acute  . Plantar fasciitis of left foot 06/16/2018  Class: Chronic  . Tendonitis, Achilles, right 06/16/2018    Class: Chronic  . Osteochondral talar dome lesion 06/16/2018    Class: Chronic  . Deep incisional surgical site infection   . Superficial incisional surgical site infection 06/11/2018    Class: Acute  . Right ankle effusion 06/11/2018    Class: Acute  . Morbid (severe) obesity due to excess calories ([place]) 05/29/2018    Class: Chronic  . Spondylolisthesis, lumbar region 05/27/2018    Class: Chronic  . Spinal stenosis of lumbar region 05/27/2018    Class: Chronic  . Urinary tract infection 05/27/2018    Class: Acute  . Fusion of spine of lumbar region 05/27/2018  . Pain of right hip joint 07/03/2017  . Acute right-sided low back pain with right-sided sciatica  03/27/2017  . Chronic right shoulder pain 02/13/2017  . History of rotator cuff tear 11/30/2016  . Impingement syndrome of right shoulder 11/30/2016  . Exacerbation of asthma 07/18/2016  . Hypokalemia 07/18/2016  . Hypertension 07/18/2016  . Depression with anxiety 07/18/2016  . Hypothyroidism 07/18/2016  . Hyperglycemia 07/18/2016  . Acute asthma exacerbation 07/18/2016  . Surgical wound dehiscence left hip; questionable superficial infection 11/10/2015  . Left hip postoperative wound infection 11/10/2015  . Osteoarthritis of left hip 09/09/2015  . 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 Obesity (BMI 35.0-39.9 without comorbidity) 04/28/2013   Past Medical History:  Diagnosis Date  . Anemia    taking iron now. pt states having no current issues 09/02/2015  . Anginal pain ([place])    pt states experiences chest wall pain pt states related to her asthma   . Anxiety    with MRI's  . Arthritis    Everywhere  . Asthma   . Depression   . Dizziness   . GERD (gastroesophageal reflux disease)   . Headache(784.0)    HX OF MIGRAINES  . History of bronchitis   . Hypertension   . Hypothyroidism    takes levothyroxen  . OSA on CPAP    wears cpap  . Shortness of breath    with exertion  . Wears glasses     Family History  Problem Relation Age of Onset  . Cancer Mother        colon  . Epilepsy Mother   . Cancer Father        prostate  . Diabetes Father   . Hypertension Father   . Hypertension Maternal Aunt   . Diabetes Maternal Aunt   . Hypertension Paternal Aunt     Past Surgical History:  Procedure Laterality Date  . BACK SURGERY    . CESAREAN SECTION     times 2  . CHOLECYSTECTOMY    . ENDOMETRIAL ABLATION    . INCISION AND DRAINAGE HIP Left 11/10/2015   Procedure: IRRIGATION AND DEBRIDEMENT LEFT HIP INCISION;  Surgeon: Mcarthur Rossetti, MD;  Location: [place];  Service: Orthopedics;  Laterality: Left;  . JOINT REPLACEMENT  2011   total left knee   . KNEE ARTHROPLASTY  04/23/2012   right   . KNEE ARTHROSCOPY    . LUMBAR LAMINECTOMY/DECOMPRESSION MICRODISCECTOMY N/A 06/23/2019   Procedure: RIGHT LUMBAR FIVE THROUGH SACRAL ONE PARTIAL HEMILAMINECTOMY WITH RIGHT LUMBAR FIVE FORAMINOTOMY;  Surgeon: Jessy Oto, MD;  Location: [place];  Service: Orthopedics;  Laterality: N/A;  . LUMBAR WOUND DEBRIDEMENT N/A 06/11/2018   Procedure: LUMBAR WOUND DEBRIDEMENT;  Surgeon: Jessy Oto, MD;  Location: [place];  Service: Orthopedics;  Laterality: N/A;  . RADIOLOGY WITH ANESTHESIA N/A 09/09/2018   Procedure: LUMBER SPINE WITHOUT CONTRAST;  Surgeon: Radiologist, Medication, MD;  [facility];  Service: Radiology;  Laterality: N/A;  . ROTATOR CUFF REPAIR     left   . SHOULDER SURGERY     right to repair ligament tear  . TOTAL HIP ARTHROPLASTY Left 09/09/2015   Procedure: LEFT TOTAL HIP ARTHROPLASTY ANTERIOR APPROACH;  Surgeon: Kathryne Hitch, MD;  [facility];  Service: Orthopedics;  Laterality: Left;  . TOTAL KNEE ARTHROPLASTY  04/23/2012   Procedure: TOTAL KNEE ARTHROPLASTY;  Surgeon: [name], MD;  [facility];  Service: Orthopedics;  Laterality: Right;  Right Total Knee Arthroplasty  . TUBAL LIGATION  1996   Social History   Occupational History  . Not on file  Tobacco Use  . Smoking status: Former Smoker    Packs/day: 0.50    Years: 4.00    Pack years: 2.00    Quit date: 09/18/1991    Years since quitting: 28.4  . Smokeless tobacco: Never Used  Substance and Sexual Activity  . Alcohol use: No  . Drug use: No  . Sexual activity: Yes    Birth control/protection: Surgical

## 2020-02-16 ENCOUNTER — Telehealth: Payer: Self-pay | Admitting: Specialist

## 2020-02-16 NOTE — Telephone Encounter (Signed)
Patient called. She would like to know if she should keep the appointment for Thursday or cxl. Her call back number is [phone]

## 2020-02-17 NOTE — Telephone Encounter (Signed)
Since patient is going to be pursuing the Spinal cord stim, she wants to cancel her appt tomorrow with Dr. Otelia Sergeant

## 2020-02-18 ENCOUNTER — Telehealth: Payer: Self-pay | Admitting: Orthopaedic Surgery

## 2020-02-18 ENCOUNTER — Ambulatory Visit: Payer: Medicare Other | Admitting: Specialist

## 2020-02-18 NOTE — Telephone Encounter (Signed)
Marilyn [name] with Dr. Ollen Bowl office called stating they received everything except for the pts insurance info and would like for Dr. Magnus Ivan to fax that over.   Fax# [phone]

## 2020-02-18 NOTE — Telephone Encounter (Signed)
Can we fax this to them

## 2020-02-18 NOTE — Telephone Encounter (Signed)
Cancelled appt

## 2020-02-19 NOTE — Telephone Encounter (Signed)
Insurance information has been sent as requested to number listed below

## 2020-03-09 ENCOUNTER — Other Ambulatory Visit: Payer: Self-pay

## 2020-03-09 ENCOUNTER — Ambulatory Visit (INDEPENDENT_AMBULATORY_CARE_PROVIDER_SITE_OTHER): Payer: Medicare Other | Admitting: Physician Assistant

## 2020-03-09 ENCOUNTER — Encounter: Payer: Self-pay | Admitting: Physician Assistant

## 2020-03-09 DIAGNOSIS — M654 Radial styloid tenosynovitis [de Quervain]: Secondary | ICD-10-CM

## 2020-03-09 MED ORDER — METHYLPREDNISOLONE ACETATE 40 MG/ML IJ SUSP
40.0000 mg | INTRAMUSCULAR | Status: AC | PRN
  Administered 2020-03-09: 40 mg

## 2020-03-09 MED ORDER — LIDOCAINE HCL 1 % IJ SOLN
1.0000 mL | INTRAMUSCULAR | Status: AC | PRN
  Administered 2020-03-09: 1 mL

## 2020-03-09 NOTE — Progress Notes (Signed)
   Procedure Note  Patient: Marilyn [name]             Date of Birth: [birth year]           MRN: [id number]             Visit Date: 03/09/2020 HPI: Marilyn [name] returns today for injection for de Quervain's the right hand.  She continues to have pain in the hand despite use of Voltaren gel and thumb spica splint.  She describes sharp pain first extensor compartment right hand.  We held off on the injection due to the fact that she had recently had her first Covid the vaccine.  Physical exam: Right hand tenderness over the first extensor compartment.  No ecchymosis erythema or edema.  Negative grind test right thumb.  Positive Finkelstein's test right thumb.  Procedures: Visit Diagnoses:  1. De Quervain's disease (radial styloid tenosynovitis)     Hand/UE Inj: R extensor compartment 1 for de Quervain's tenosynovitis on 03/09/2020 1:21 PM Indications: pain Medications: 1 mL lidocaine 1 %; 40 mg methylPREDNISolone acetate 40 MG/ML Consent was given by the parent. Immediately prior to procedure a time out was called to verify the correct patient, procedure, equipment, support staff and site/side marked as required. Patient was prepped and draped in the usual sterile fashion.     Plan she will follow-up with [place] in 2 weeks see what type of response she had to the cortisone injection.  Continue Voltaren gel and thumb spica.  Questions encouraged and answered at length.  Next step would be to send her to formal physical therapy for the de Quervain's.

## 2020-03-13 ENCOUNTER — Other Ambulatory Visit (INDEPENDENT_AMBULATORY_CARE_PROVIDER_SITE_OTHER): Payer: Self-pay | Admitting: Specialist

## 2020-03-28 ENCOUNTER — Ambulatory Visit: Payer: Medicare Other | Admitting: Physician Assistant

## 2020-04-06 DIAGNOSIS — M961 Postlaminectomy syndrome, not elsewhere classified: Secondary | ICD-10-CM | POA: Insufficient documentation

## 2020-04-22 ENCOUNTER — Other Ambulatory Visit (HOSPITAL_COMMUNITY): Payer: Self-pay | Admitting: Adult Health Nurse Practitioner

## 2020-04-22 DIAGNOSIS — Z1231 Encounter for screening mammogram for malignant neoplasm of breast: Secondary | ICD-10-CM

## 2020-04-23 ENCOUNTER — Other Ambulatory Visit (INDEPENDENT_AMBULATORY_CARE_PROVIDER_SITE_OTHER): Payer: Self-pay | Admitting: Specialist

## 2020-05-12 ENCOUNTER — Ambulatory Visit (HOSPITAL_COMMUNITY)
Admission: RE | Admit: 2020-05-12 | Discharge: 2020-05-12 | Disposition: A | Discharge status: Home / Self Care | Payer: Medicare Other | Source: Ambulatory Visit | Attending: Adult Health Nurse Practitioner | Admitting: Adult Health Nurse Practitioner

## 2020-05-12 ENCOUNTER — Other Ambulatory Visit: Payer: Self-pay

## 2020-05-12 DIAGNOSIS — Z1231 Encounter for screening mammogram for malignant neoplasm of breast: Secondary | ICD-10-CM | POA: Insufficient documentation

## 2020-06-30 ENCOUNTER — Ambulatory Visit (INDEPENDENT_AMBULATORY_CARE_PROVIDER_SITE_OTHER): Payer: 59 | Admitting: Physician Assistant

## 2020-06-30 ENCOUNTER — Encounter: Payer: Self-pay | Admitting: Physician Assistant

## 2020-06-30 DIAGNOSIS — M654 Radial styloid tenosynovitis [de Quervain]: Secondary | ICD-10-CM

## 2020-06-30 NOTE — Progress Notes (Signed)
HPI [name] returns today due to right wrist pain which is similar to what she was having in March 09, 2020.  She states the injection at that time gave her good relief until just recently.  She has developed some thumb pain over the last few days.  No known injury.  She did try the Voltaren gel but it was not helpful.  She is wondering about getting a another injection.  Review of systems: Please see HPI.  Physical exam General well-developed well-nourished female no acute distress mood affect appropriate. Right wrist she has good range of motion of the wrist without pain.  She has tenderness over the first extensor compartment of the right wrist there is no erythema no edema in this region.  Positive Finkelstein's on the right negative grind test right thumb.  Impression: Right de Quervain's syndrome  Plan: Discussed with her injection however she is recently had a flu vaccine would recommend waiting at least 10 days.  She has an appoint with Dr. Lajoyce [name] next week and therefore we will have either Dr. Lajoyce [name] and his staff give her injection for the de Quervain's syndrome or I can place this at that time.  In the interim recommend she use the thumb spica splint which she has.  Questions were encouraged and answered

## 2020-07-04 ENCOUNTER — Ambulatory Visit: Payer: Self-pay

## 2020-07-04 ENCOUNTER — Encounter: Payer: Self-pay | Admitting: Orthopedic Surgery

## 2020-07-04 ENCOUNTER — Ambulatory Visit (INDEPENDENT_AMBULATORY_CARE_PROVIDER_SITE_OTHER): Payer: 59 | Admitting: Orthopedic Surgery

## 2020-07-04 VITALS — Ht 63.0 in | Wt 360.0 lb

## 2020-07-04 DIAGNOSIS — M654 Radial styloid tenosynovitis [de Quervain]: Secondary | ICD-10-CM

## 2020-07-04 DIAGNOSIS — M25511 Pain in right shoulder: Secondary | ICD-10-CM

## 2020-07-04 DIAGNOSIS — Z6841 Body Mass Index (BMI) 40.0 and over, adult: Secondary | ICD-10-CM | POA: Diagnosis not present

## 2020-07-04 DIAGNOSIS — M25531 Pain in right wrist: Secondary | ICD-10-CM

## 2020-07-04 DIAGNOSIS — M25512 Pain in left shoulder: Secondary | ICD-10-CM

## 2020-07-04 DIAGNOSIS — G8929 Other chronic pain: Secondary | ICD-10-CM

## 2020-07-04 MED ORDER — LIDOCAINE HCL 1 % IJ SOLN
5.0000 mL | INTRAMUSCULAR | Status: AC | PRN
  Administered 2020-07-04: 5 mL

## 2020-07-04 MED ORDER — LIDOCAINE HCL 1 % IJ SOLN
1.0000 mL | INTRAMUSCULAR | Status: AC | PRN
  Administered 2020-07-04: 1 mL

## 2020-07-04 MED ORDER — METHYLPREDNISOLONE ACETATE 40 MG/ML IJ SUSP
40.0000 mg | INTRAMUSCULAR | Status: AC | PRN
  Administered 2020-07-04: 40 mg

## 2020-07-04 MED ORDER — METHYLPREDNISOLONE ACETATE 40 MG/ML IJ SUSP
40.0000 mg | INTRAMUSCULAR | Status: AC | PRN
  Administered 2020-07-04: 40 mg via INTRA_ARTICULAR

## 2020-07-04 NOTE — Progress Notes (Signed)
Office Visit Note   Patient: Marilyn [name]           Date of Birth: Jan 01, 1971           MRN: [id number] Visit Date: 07/04/2020              Requested by: Rebecka Apley, NP [address] [place],  [place] [address] PCP: Rebecka Apley, NP  Chief Complaint  Patient presents with  . Right Shoulder - Pain  . Left Shoulder - Pain      HPI: Patient is a 49 year old woman who presents complaining of bilateral shoulder pain left worse than right she is status post left shoulder arthroscopy.  She states she cannot get her hand up overhead.  She states is been getting worse over the past 2 and half weeks.  Patient states she is also had chronic de Quervain's tenosynovitis which is still painful on the right wrist.  Assessment & Plan: Visit Diagnoses:  1. Chronic pain of both shoulders   2. De Quervain's disease (radial styloid tenosynovitis)     Plan: Left subacromial injection performed she tolerated this well did have some temporary relief she states she also felt better in the first dorsal extensor compartment.  She will follow-up with [place] as needed if she needs an injection for the right shoulder.  Follow-Up Instructions: Return if symptoms worsen or fail to improve.   Ortho Exam  Patient is alert, oriented, no adenopathy, well-dressed, normal affect, normal respiratory effort. Examination patient has abduction and flexion of both shoulders to 90 degrees.  She has pain with Neer and Hawkins impingement test bilaterally.  Radiographs shows degenerative arthritis of both shoulders left worse than right.  She has pain to palpation of the first dorsal extensor compartment right wrist she has a positive Finkelstein's test.  Imaging: XR Shoulder Left  Result Date: 07/04/2020 Three-view radiographs of the left shoulder shows inferior spurring along the humeral head with decreased joint space and inferior glenoid irregularity.  XR Shoulder Right  Result  Date: 07/04/2020 2 view radiographs of the right shoulder shows inferior irregularity along the glenoid as well as a bony spur inferiorly on the humeral head  No images are attached to the encounter.  Labs: Lab Results  Component Value Date   HGBA1C 6.2 (H) 07/18/2016   HGBA1C 6.2 (H) 09/02/2015   ESRSEDRATE 56 (H) 01/08/2019   ESRSEDRATE CANCELED 09/18/2018   ESRSEDRATE 67 (H) 08/18/2018   CRP 30.7 (H) 01/08/2019   CRP 16.3 (H) 09/18/2018   CRP 20.9 (H) 08/18/2018   LABURIC 5.9 01/08/2019   LABURIC 8.7 (H) 06/16/2018   REPTSTATUS 05/22/2019 FINAL 05/20/2019   GRAMSTAIN  06/11/2018    FEW WBC PRESENT,BOTH PMN AND MONONUCLEAR NO ORGANISMS SEEN    CULT  05/20/2019    Multiple bacterial morphotypes present, none predominant. Suggest appropriate recollection if clinically indicated.   LABORGA PSEUDOMONAS AERUGINOSA 06/11/2018     Lab Results  Component Value Date   ALBUMIN 3.9 06/17/2019   ALBUMIN 4.1 05/20/2019   ALBUMIN 3.3 (L) 07/10/2018   LABURIC 5.9 01/08/2019   LABURIC 8.7 (H) 06/16/2018    Lab Results  Component Value Date   MG 1.7 05/20/2019   No results found for: VD25OH  No results found for: PREALBUMIN CBC EXTENDED Latest Ref Rng & Units 12/09/2019 06/24/2019 06/17/2019  WBC 4.0 - 10.5 K/uL 6.3 10.2 7.3  RBC 3.87 - 5.11 MIL/uL 4.03 3.77(L) 4.37  HGB 12.0 - 15.0  g/dL 11.2(L) 10.9(L) 12.3  HCT 36 - 46 % 37.8 34.5(L) 40.6  PLT 150 - 400 K/uL 225 265 294  NEUTROABS 1.7 - 7.7 K/uL 3.3 - -  LYMPHSABS 0.7 - 4.0 K/uL 2.2 - -     Body mass index is 63.77 kg/m.  Orders:  Orders Placed This Encounter  Procedures  . XR Shoulder Right  . XR Shoulder Left   No orders of the defined types were placed in this encounter.    Procedures: Large Joint Inj: L subacromial bursa on 07/04/2020 3:29 PM Indications: diagnostic evaluation and pain Details: 22 G 1.5 in needle, posterior approach  Arthrogram: No  Medications: 5 mL lidocaine 1 %; 40 mg  methylPREDNISolone acetate 40 MG/ML Outcome: tolerated well, no immediate complications Procedure, treatment alternatives, risks and benefits explained, specific risks discussed. Consent was given by the patient. Immediately prior to procedure a time out was called to verify the correct patient, procedure, equipment, support staff and site/side marked as required. Patient was prepped and draped in the usual sterile fashion.   Hand/UE Inj: R extensor compartment 1 for de Quervain's tenosynovitis on 07/04/2020 3:30 PM Indications: therapeutic and diagnostic Details: 22 G needle Medications: 1 mL lidocaine 1 %; 40 mg methylPREDNISolone acetate 40 MG/ML Outcome: tolerated well, no immediate complications Procedure, treatment alternatives, risks and benefits explained, specific risks discussed. Consent was given by the patient. Immediately prior to procedure a time out was called to verify the correct patient, procedure, equipment, support staff and site/side marked as required. Patient was prepped and draped in the usual sterile fashion.      Clinical Data: No additional findings.  ROS:  All other systems negative, except as noted in the HPI. Review of Systems  Objective: Vital Signs: Ht 5\' 3"  (1.6 m)   Wt (!) 360 lb (163.3 kg)   BMI 63.77 kg/m   Specialty Comments:  No specialty comments available.  PMFS History: Patient Active Problem List   Diagnosis Date Noted  . Leg pain, right 10/20/2019  . Dysesthesia 10/20/2019  . 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 Other spondylosis with radiculopathy, lumbar region   . Status post lumbar laminectomy 06/23/2019  . C. difficile colitis 05/22/2019  . Hypophosphatemia 05/21/2019  . Syncope 05/20/2019  . OSA on CPAP   . Acute gastroenteritis   . Anemia due to blood loss, acute 06/18/2018    Class: Acute  . Plantar fasciitis of left foot 06/16/2018    Class: Chronic  . Tendonitis, Achilles, right 06/16/2018     Class: Chronic  . Osteochondral talar dome lesion 06/16/2018    Class: Chronic  . Deep incisional surgical site infection   . Superficial incisional surgical site infection 06/11/2018    Class: Acute  . Right ankle effusion 06/11/2018    Class: Acute  . Morbid (severe) obesity due to excess calories (HCC) 05/29/2018    Class: Chronic  . Spondylolisthesis, lumbar region 05/27/2018    Class: Chronic  . Spinal stenosis of lumbar region 05/27/2018    Class: Chronic  . Urinary tract infection 05/27/2018    Class: Acute  . Fusion of spine of lumbar region 05/27/2018  . Pain of right hip joint 07/03/2017  . Acute right-sided low back pain with right-sided sciatica 03/27/2017  . Chronic right shoulder pain 02/13/2017  . History of rotator cuff tear 11/30/2016  . Impingement syndrome of right shoulder 11/30/2016  . Exacerbation of asthma 07/18/2016  . Hypokalemia 07/18/2016  . Hypertension 07/18/2016  .  Depression with anxiety 07/18/2016  . Hypothyroidism 07/18/2016  . Hyperglycemia 07/18/2016  . Acute asthma exacerbation 07/18/2016  . Surgical wound dehiscence left hip; questionable superficial infection 11/10/2015  . Left hip postoperative wound infection 11/10/2015  . Osteoarthritis of left hip 09/09/2015  . 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 Obesity (BMI 35.0-39.9 without comorbidity) 04/28/2013   Past Medical History:  Diagnosis Date  . Anemia    taking iron now. pt states having no current issues 09/02/2015  . Anginal pain (HCC)    pt states experiences chest wall pain pt states related to her asthma   . Anxiety    with MRI's  . Arthritis    Everywhere  . Asthma   . Depression   . Dizziness   . GERD (gastroesophageal reflux disease)   . Headache(784.0)    HX OF MIGRAINES  . History of bronchitis   . Hypertension   . Hypothyroidism    takes levothyroxen  . OSA on CPAP    wears cpap  . Shortness of breath    with exertion  . Wears glasses      Family History  Problem Relation Age of Onset  . Cancer Mother        colon  . Epilepsy Mother   . Cancer Father        prostate  . Diabetes Father   . Hypertension Father   . Hypertension Maternal Aunt   . Diabetes Maternal Aunt   . Hypertension Paternal Aunt     Past Surgical History:  Procedure Laterality Date  . BACK SURGERY    . CESAREAN SECTION     times 2  . CHOLECYSTECTOMY    . ENDOMETRIAL ABLATION    . INCISION AND DRAINAGE HIP Left 11/10/2015   Procedure: IRRIGATION AND DEBRIDEMENT LEFT HIP INCISION;  Surgeon: Kathryne Hitchhristopher Y Blackman, MD;  Location: [facility] OR;  Service: Orthopedics;  Laterality: Left;  . JOINT REPLACEMENT  2011   total left knee  . KNEE ARTHROPLASTY  04/23/2012   right   . KNEE ARTHROSCOPY    . LUMBAR LAMINECTOMY/DECOMPRESSION MICRODISCECTOMY N/A 06/23/2019   Procedure: RIGHT LUMBAR FIVE THROUGH SACRAL ONE PARTIAL HEMILAMINECTOMY WITH RIGHT LUMBAR FIVE FORAMINOTOMY;  Surgeon: Kerrin ChampagneNitka, James E, MD;  Location: [facility];  Service: Orthopedics;  Laterality: N/A;  . LUMBAR WOUND DEBRIDEMENT N/A 06/11/2018   Procedure: LUMBAR WOUND DEBRIDEMENT;  Surgeon: Kerrin ChampagneNitka, James E, MD;  [facility];  Service: Orthopedics;  Laterality: N/A;  . RADIOLOGY WITH ANESTHESIA N/A 09/09/2018   Procedure: LUMBER SPINE WITHOUT CONTRAST;  Surgeon: Radiologist, Medication, MD;  Location: [facility];  Service: Radiology;  Laterality: N/A;  . ROTATOR CUFF REPAIR     left   . SHOULDER SURGERY     right to repair ligament tear  . TOTAL HIP ARTHROPLASTY Left 09/09/2015   Procedure: LEFT TOTAL HIP ARTHROPLASTY ANTERIOR APPROACH;  Surgeon: Kathryne Hitchhristopher Y Blackman, MD;  Location: [facility];  Service: Orthopedics;  Laterality: Left;  . TOTAL KNEE ARTHROPLASTY  04/23/2012   Procedure: TOTAL KNEE ARTHROPLASTY;  Surgeon: Nadara MustardMarcus V Vyron Fronczak, MD;  Location: [facility];  Service: Orthopedics;  Laterality: Right;  Right Total Knee Arthroplasty  . TUBAL LIGATION  1996   Social History   Occupational History  . Not on  file  Tobacco Use  . Smoking status: Former Smoker    Packs/day: 0.50    Years: 4.00    Pack years: 2.00    Quit date: 09/18/1991  Years since quitting: 28.8  . Smokeless tobacco: Never Used  Vaping Use  . Vaping Use: Never used  Substance and Sexual Activity  . Alcohol use: No  . Drug use: No  . Sexual activity: Yes    Birth control/protection: Surgical

## 2020-08-02 HISTORY — PX: SPINAL CORD STIMULATOR IMPLANT: SHX2422

## 2020-10-11 ENCOUNTER — Ambulatory Visit: Payer: 59 | Admitting: Orthopedic Surgery

## 2020-10-11 IMAGING — CT CT ANGIO CHEST-ABD-PELV FOR DISSECTION W/ AND WO/W CM
2 of 7 series · 14 of 46 positions shown, 16 images · IV contrast (Omnipaque or Isovue)
Comparison: 08/27/2016

CLINICAL DATA: Patient c/o shortness of breathe with a history of
asthma. Patient has used her rescue inhaler and nebulizer without
resolution. Sx began on [REDACTED] of last week.

EXAM:
CT ANGIOGRAPHY CHEST, ABDOMEN AND PELVIS
TECHNIQUE: Multidetector CT imaging through the chest, abdomen and pelvis was
performed using the standard protocol during bolus administration of
intravenous contrast. Multiplanar reconstructed images and MIPs were
obtained and reviewed to evaluate the vascular anatomy.
CONTRAST:  100mL OMNIPAQUE IOHEXOL 350 MG/ML SOLN

[Series 6: axial arterial · axial · arterial · 0.84mm/px · z∈[+998,+1520]mm · 11 of 204 slices shown, 13 images]
[im 15/204  soft-tissue]
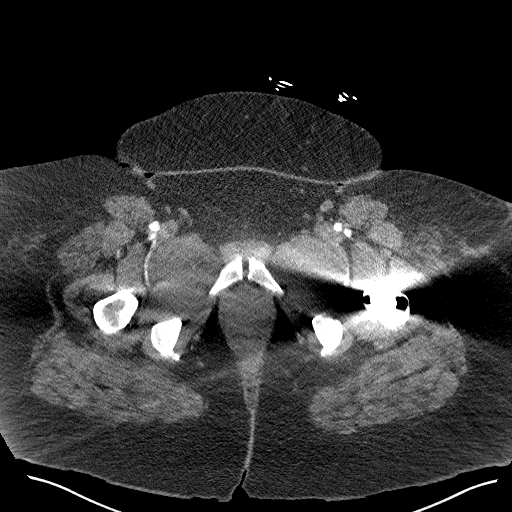
[im 15/204  bone]
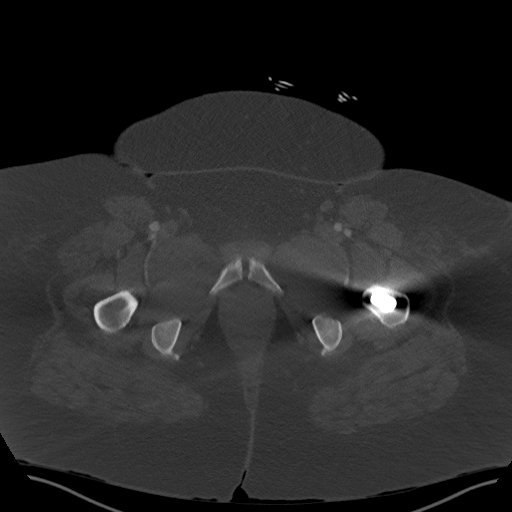
[im 30/204  soft-tissue]
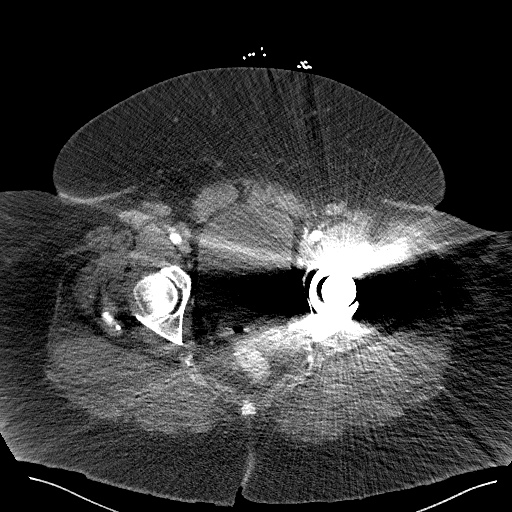
[im 44/204  soft-tissue]
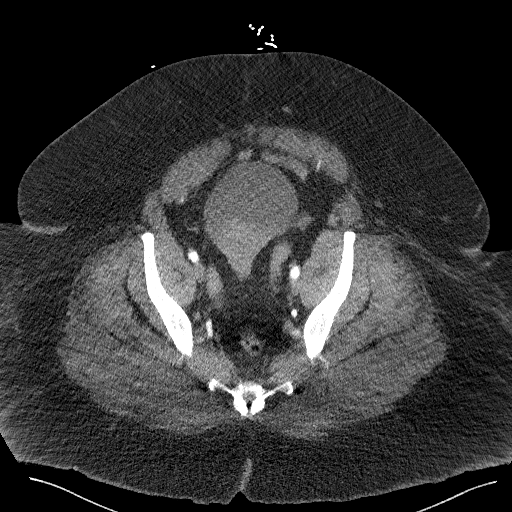
[im 73/204  soft-tissue]
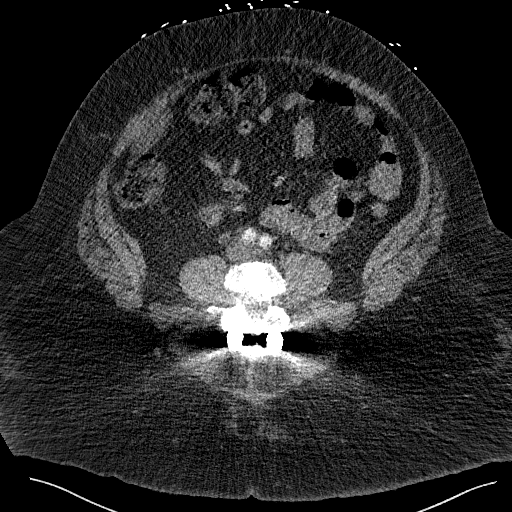
[im 88/204  soft-tissue]
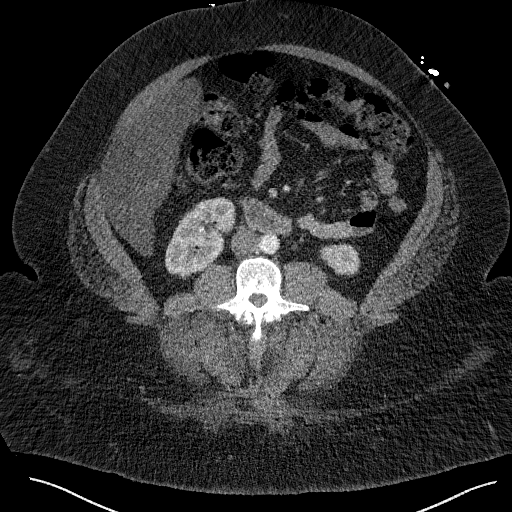
[im 102/204  soft-tissue]
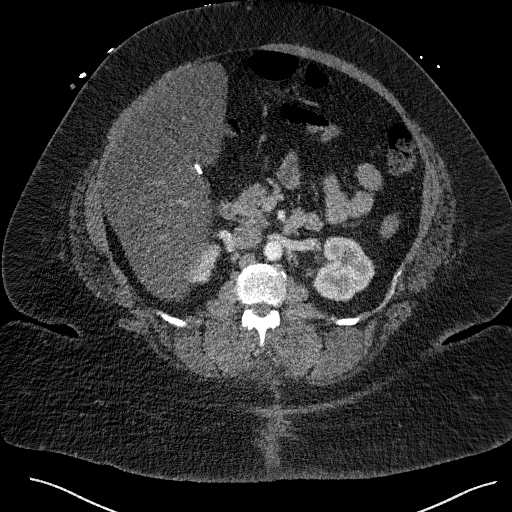
[im 117/204  soft-tissue]
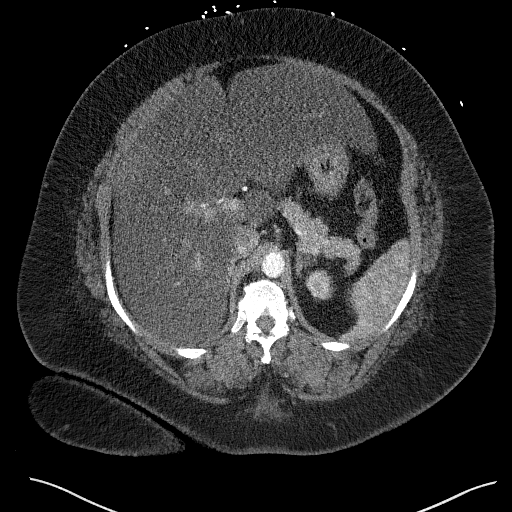
[im 131/204  soft-tissue]
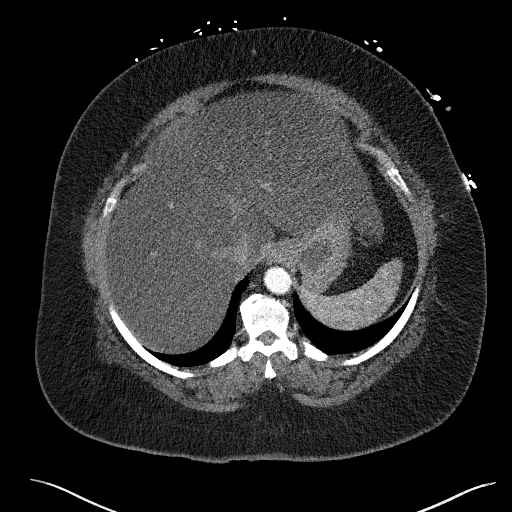
[im 160/204  soft-tissue]
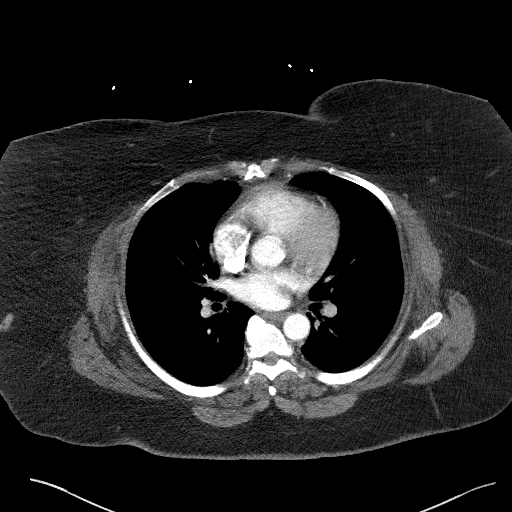
[im 160/204  bone]
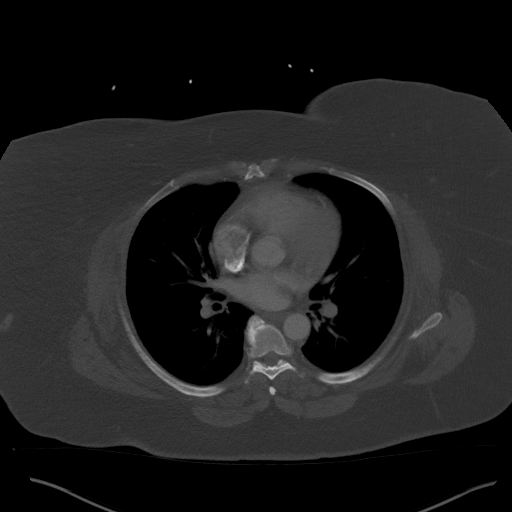
[im 175/204  soft-tissue]
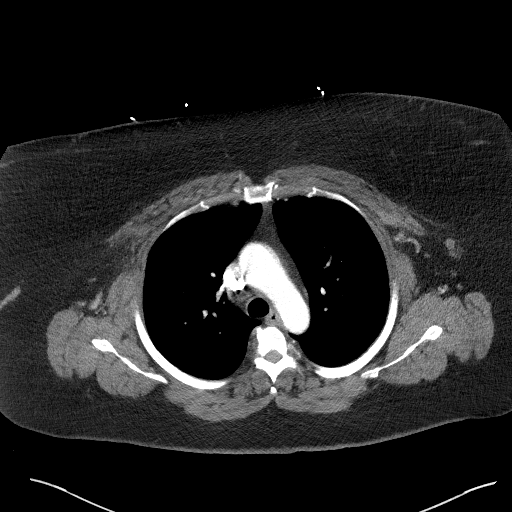
[im 189/204  soft-tissue]
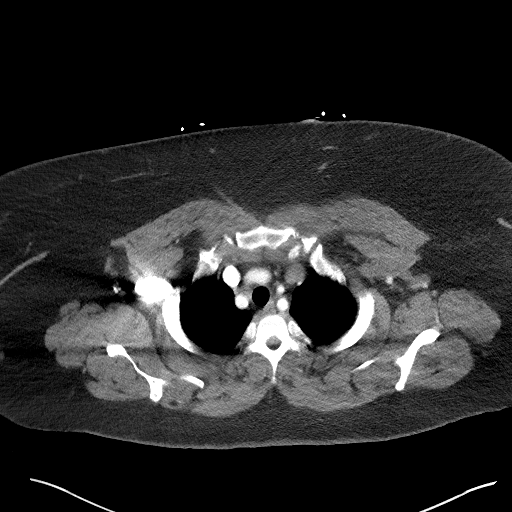

[Series 10: cor soft · coronal · 0.89mm/px · 3 of 182 slices shown]
[im 46/182  soft-tissue]
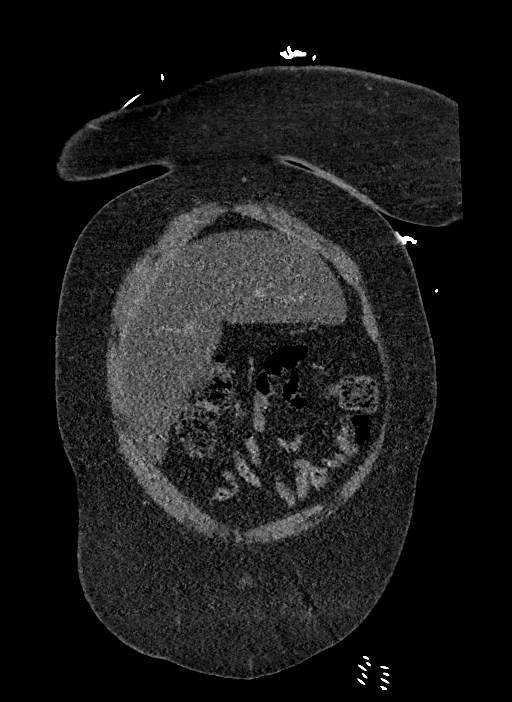
[im 91/182  soft-tissue]
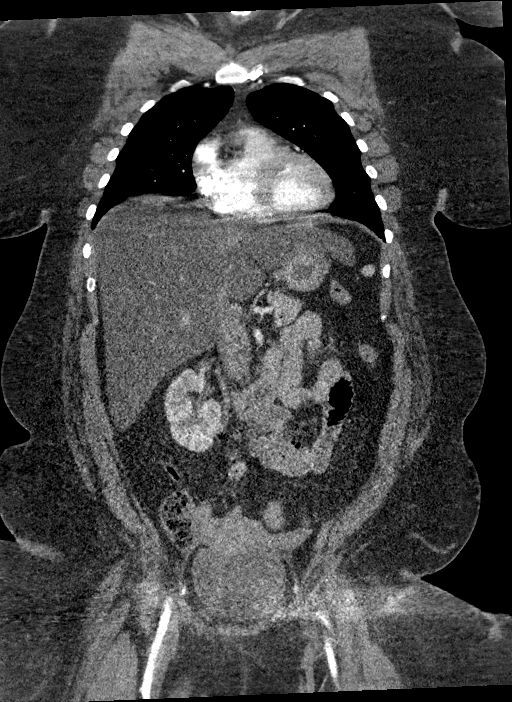
[im 136/182  soft-tissue]
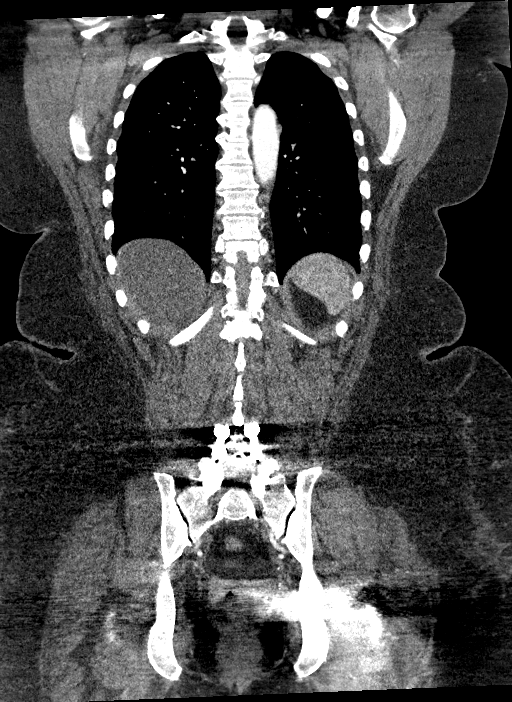

[14 of 46 positions shown; findings below may reference images not displayed]

FINDINGS: CTA CHEST FINDINGS

Cardiovascular: There is satisfactory opacification of the aorta.
Aorta is normal caliber. No dissection. Minor atherosclerosis along
the inferior aspect of the distal arch. Mild atherosclerosis at the
origin of the left subclavian artery. No stenosis is

Heart normal in size and configuration. No coronary artery
calcifications. No pericardial effusion.

Mediastinum/Nodes: No neck base, mediastinal or hilar masses or
enlarged lymph nodes. Trachea and esophagus are unremarkable.

Lungs/Pleura: Lungs are clear. No pleural effusion or pneumothorax.

Musculoskeletal: No fracture or acute finding. No osteoblastic or
osteolytic lesions.

Review of the MIP images confirms the above findings.

CTA ABDOMEN AND PELVIS FINDINGS

VASCULAR

Aorta: Normal caliber aorta without aneurysm, dissection, vasculitis
or significant stenosis.

Celiac: Patent without evidence of aneurysm, dissection, vasculitis
or significant stenosis.

SMA: Patent without evidence of aneurysm, dissection, vasculitis or
significant stenosis.

Renals: Both renal arteries are patent without evidence of aneurysm,
dissection, vasculitis, fibromuscular dysplasia or significant
stenosis.

IMA: Faintly visualized, but patent.

Inflow: Patent without evidence of aneurysm, dissection, vasculitis
or significant stenosis.

Veins: No obvious venous abnormality within the limitations of this
arterial phase study.

Review of the MIP images confirms the above findings.

NON-VASCULAR

Hepatobiliary: Liver mildly enlarged. Diffuse decreased attenuation
of the liver consistent with fatty infiltration. No mass or focal
lesion. Status post cholecystectomy. No bile duct dilation.

Pancreas: Unremarkable. No pancreatic ductal dilatation or
surrounding inflammatory changes.

Spleen: Normal in size without focal abnormality.

Adrenals/Urinary Tract: Adrenal glands are unremarkable. Kidneys are
normal, without renal calculi, focal lesion, or hydronephrosis.
Bladder is unremarkable.

Stomach/Bowel: Stomach is within normal limits. Appendix appears
normal. No evidence of bowel wall thickening, distention, or
inflammatory changes.

Lymphatic: No enlarged lymph nodes.

Reproductive: Uterus and bilateral adnexa are unremarkable.

Other: No abdominal wall hernia or abnormality. No abdominopelvic
ascites.

Musculoskeletal: Total left hip arthroplasty, well seated and
aligned. Status post posterior fusion, L4, L5 and S1 with
well-positioned, well-seated pedicle screws and intact
interconnecting rods. No fracture. No osteoblastic or osteolytic
lesions.

Review of the MIP images confirms the above findings.
IMPRESSION: CTA FINDINGS

1. No aortic aneurysm or dissection.
2. Minimal atherosclerosis along the distal thoracic aortic arch and
mild atherosclerosis at the origin of the left subclavian artery. No
stenosis. Abdominal aorta and its branch vessels are within normal
limits.

NON CTA FINDINGS

1. Lungs are clear. No findings to account for the patient's
shortness of breath.
2. No acute findings within the abdomen or pelvis.
3. Mild hepatomegaly and extensive hepatic steatosis.

## 2020-10-11 IMAGING — DX DG CHEST 2V
2 series · 2 of 2 positions shown · non-contrast
Comparison: 05/20/2019

CLINICAL DATA: SHORTNESS OF BREATH AND PERSISTANT COUGH FOR THE
PAST 6 DAYS. HX: FORMER SMOKER, ASTHMA, HTN, MORBID OBESITY.

EXAM:
CHEST - 2 VIEW

[chest pa]
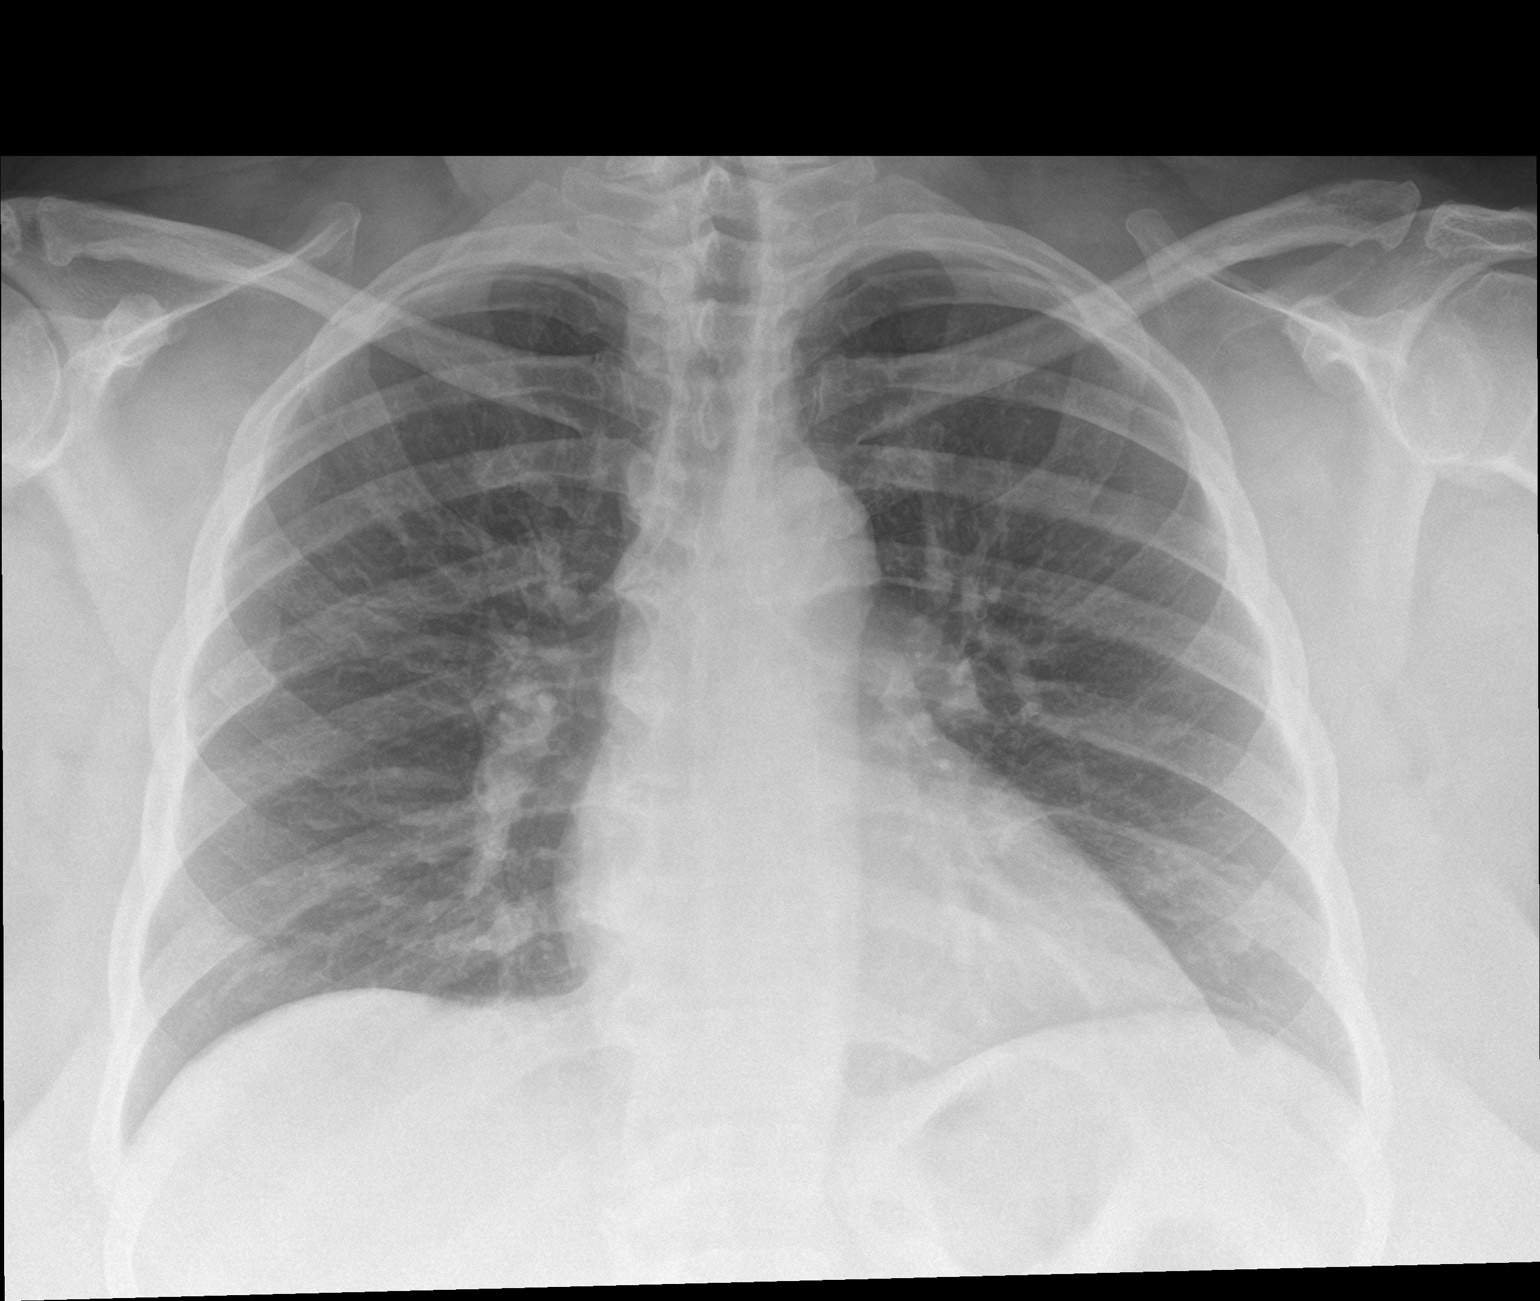

[chest lat]
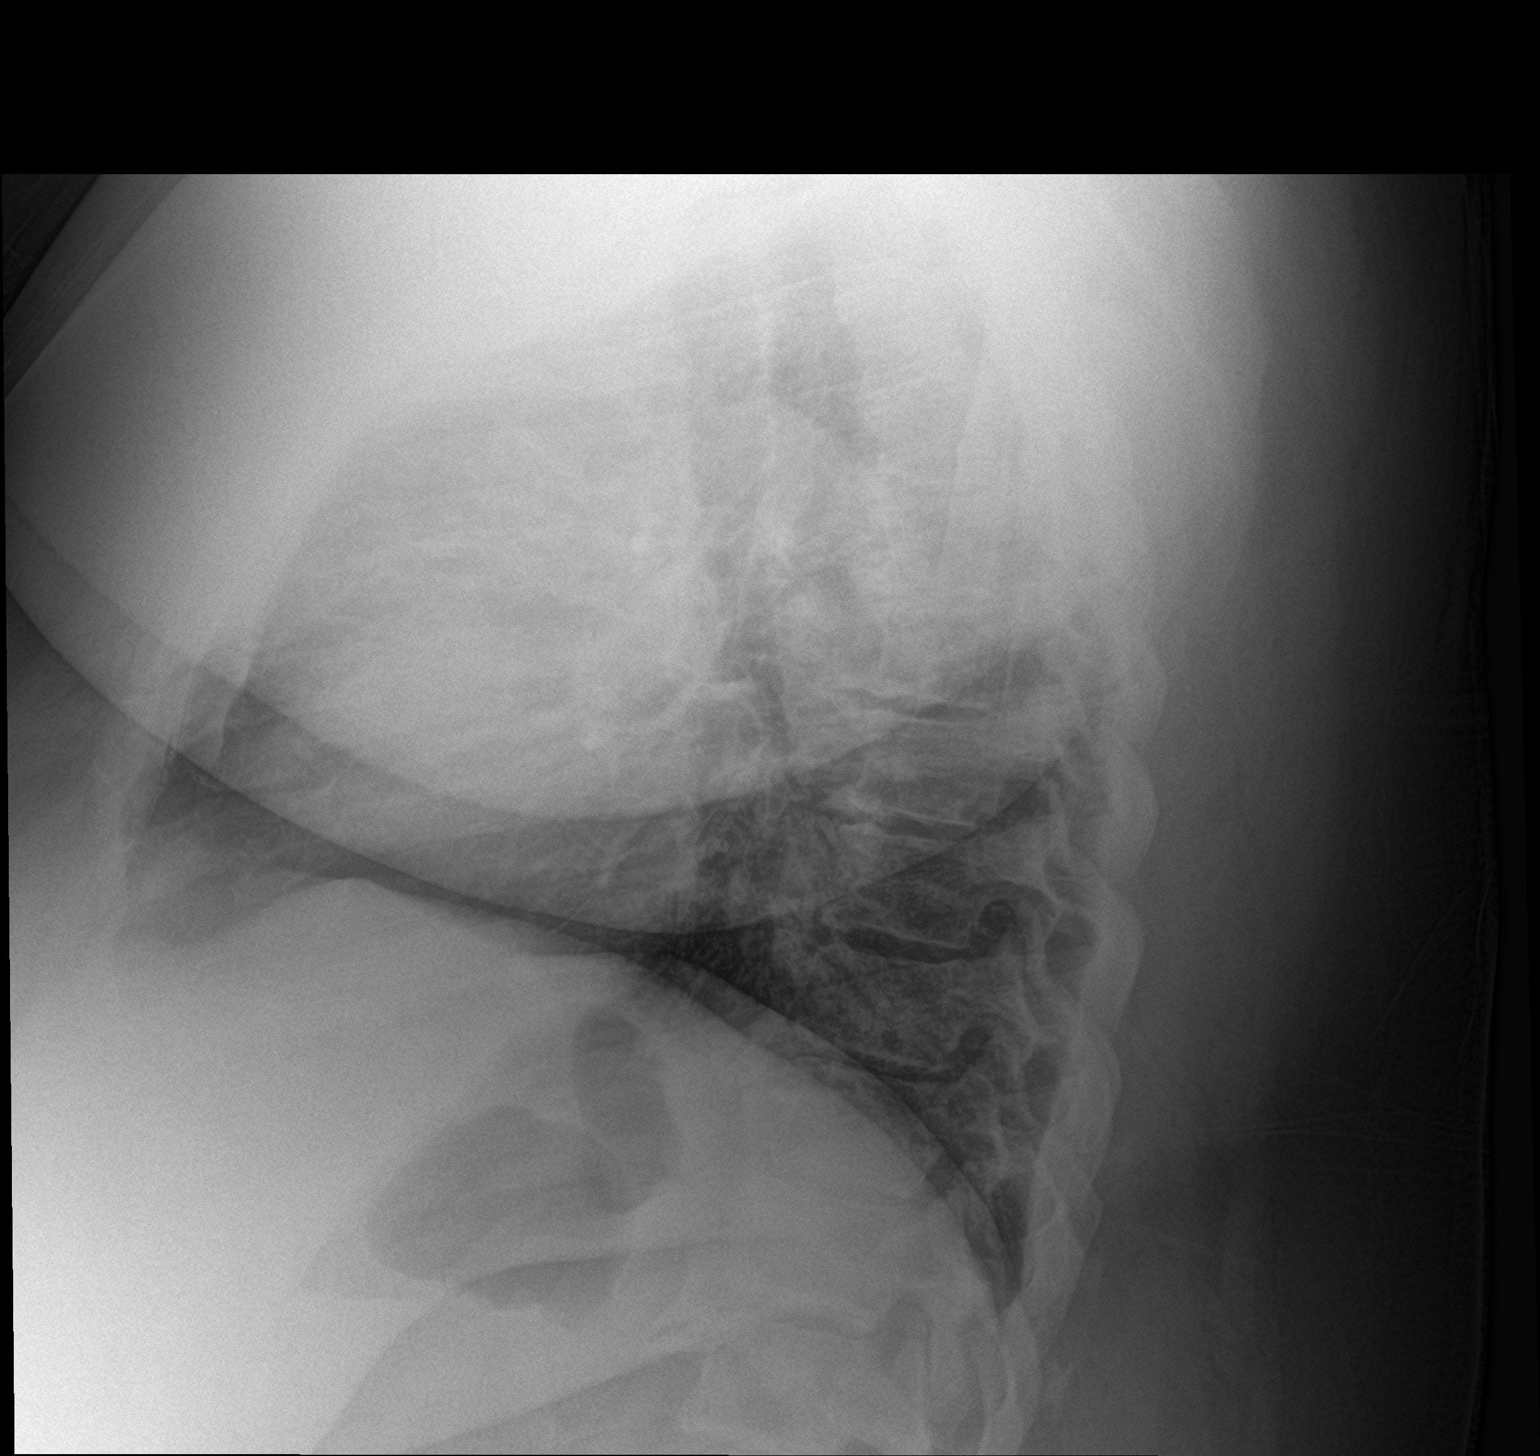

[2 of 2 positions shown; findings below may reference images not displayed]

FINDINGS: The heart size and mediastinal contours are within normal limits.
Both lungs are clear. No pleural effusion or pneumothorax. The
visualized skeletal structures are unremarkable.
IMPRESSION: No active cardiopulmonary disease.

## 2020-10-20 ENCOUNTER — Ambulatory Visit: Payer: 59 | Admitting: Orthopedic Surgery

## 2020-10-27 ENCOUNTER — Ambulatory Visit (INDEPENDENT_AMBULATORY_CARE_PROVIDER_SITE_OTHER): Payer: 59 | Admitting: Physician Assistant

## 2020-10-27 ENCOUNTER — Encounter: Payer: Self-pay | Admitting: Orthopedic Surgery

## 2020-10-27 DIAGNOSIS — M25512 Pain in left shoulder: Secondary | ICD-10-CM | POA: Diagnosis not present

## 2020-10-27 DIAGNOSIS — M25511 Pain in right shoulder: Secondary | ICD-10-CM

## 2020-10-27 DIAGNOSIS — G8929 Other chronic pain: Secondary | ICD-10-CM | POA: Diagnosis not present

## 2020-10-27 MED ORDER — LIDOCAINE HCL 1 % IJ SOLN
5.0000 mL | INTRAMUSCULAR | Status: AC | PRN
  Administered 2020-10-27: 5 mL

## 2020-10-27 MED ORDER — METHYLPREDNISOLONE ACETATE 40 MG/ML IJ SUSP
40.0000 mg | INTRAMUSCULAR | Status: AC | PRN
  Administered 2020-10-27: 40 mg via INTRA_ARTICULAR

## 2020-10-27 NOTE — Progress Notes (Signed)
Office Visit Note   Patient: Marilyn [name]           Date of Birth: 1971-09-04           MRN: [id number] Visit Date: 10/27/2020              Requested by: Rebecka Apley, NP [address] [place],  [place] [address] PCP: Rebecka Apley, NP  Chief Complaint  Patient presents with  . Right Shoulder - Pain  . Left Shoulder - Pain    07/04/20 left shoulder injection       HPI: Patient presents in follow-up today for bilateral shoulder pain.  She has had arthroscopies x2 on both shoulders.  The most recent being in 2017.  She states that this helps a little bit along with physical therapy but does not have long-lasting results.  She has also had injections in the past.  She is wondering if she could get injections today.  Also what next steps might be.  She has also tried physical therapy alone and has not helped much.  Assessment & Plan: Visit Diagnoses: No diagnosis found.  Plan: Bilateral shoulder arthritis.  With history of arthroscopies and rotator cuff debridement.  We will go forward with bilateral shoulder injections 1 more time today.  She will follow-up in 3 weeks.  At that time if she is did not get any relief we could discuss possibly referring her for evaluation for shoulder replacement surgery.  Follow-Up Instructions: No follow-ups on file.   Ortho Exam  Patient is alert, oriented, no adenopathy, well-dressed, normal affect, normal respiratory effort. Bilateral shoulders she has decreased range of motion with forward elevation resisted abduction causes her pain and positive impingement findings.  She has well-healed shoulder arthroscopy portals  Imaging: No results found. No images are attached to the encounter.  Labs: Lab Results  Component Value Date   HGBA1C 6.2 (H) 07/18/2016   HGBA1C 6.2 (H) 09/02/2015   ESRSEDRATE 56 (H) 01/08/2019   ESRSEDRATE CANCELED 09/18/2018   ESRSEDRATE 67 (H) 08/18/2018   CRP 30.7 (H) 01/08/2019    CRP 16.3 (H) 09/18/2018   CRP 20.9 (H) 08/18/2018   LABURIC 5.9 01/08/2019   LABURIC 8.7 (H) 06/16/2018   REPTSTATUS 05/22/2019 FINAL 05/20/2019   GRAMSTAIN  06/11/2018    FEW WBC PRESENT,BOTH PMN AND MONONUCLEAR NO ORGANISMS SEEN    CULT  05/20/2019    Multiple bacterial morphotypes present, none predominant. Suggest appropriate recollection if clinically indicated.   LABORGA PSEUDOMONAS AERUGINOSA 06/11/2018     Lab Results  Component Value Date   ALBUMIN 3.9 06/17/2019   ALBUMIN 4.1 05/20/2019   ALBUMIN 3.3 (L) 07/10/2018   LABURIC 5.9 01/08/2019   LABURIC 8.7 (H) 06/16/2018    Lab Results  Component Value Date   MG 1.7 05/20/2019   No results found for: VD25OH  No results found for: PREALBUMIN CBC EXTENDED Latest Ref Rng & Units 12/09/2019 06/24/2019 06/17/2019  WBC 4.0 - 10.5 K/uL 6.3 10.2 7.3  RBC 3.87 - 5.11 MIL/uL 4.03 3.77(L) 4.37  HGB 12.0 - 15.0 g/dL 11.2(L) 10.9(L) 12.3  HCT 36.0 - 46.0 % 37.8 34.5(L) 40.6  PLT 150 - 400 K/uL 225 265 294  NEUTROABS 1.7 - 7.7 K/uL 3.3 - -  LYMPHSABS 0.7 - 4.0 K/uL 2.2 - -     There is no height or weight on file to calculate BMI.  Orders:  No orders of the defined types were placed in  this encounter.  No orders of the defined types were placed in this encounter.    Procedures: Large Joint Inj: bilateral subacromial bursa on 10/27/2020 10:23 AM Indications: diagnostic evaluation and pain Details: 22 G 1.5 in needle, posterior approach  Arthrogram: No  Medications (Right): 5 mL lidocaine 1 %; 40 mg methylPREDNISolone acetate 40 MG/ML Medications (Left): 5 mL lidocaine 1 %; 40 mg methylPREDNISolone acetate 40 MG/ML Outcome: tolerated well, no immediate complications Procedure, treatment alternatives, risks and benefits explained, specific risks discussed. Consent was given by the patient.      Clinical Data: No additional findings.  ROS:  All other systems negative, except as noted in the HPI. Review of  Systems  Objective: Vital Signs: There were no vitals taken for this visit.  Specialty Comments:  No specialty comments available.  PMFS History: Patient Active Problem List   Diagnosis Date Noted  . Leg pain, right 10/20/2019  . Dysesthesia 10/20/2019  . 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 Other spondylosis with radiculopathy, lumbar region   . Status post lumbar laminectomy 06/23/2019  . C. difficile colitis 05/22/2019  . Hypophosphatemia 05/21/2019  . Syncope 05/20/2019  . OSA on CPAP   . Acute gastroenteritis   . Anemia due to blood loss, acute 06/18/2018    Class: Acute  . Plantar fasciitis of left foot 06/16/2018    Class: Chronic  . Tendonitis, Achilles, right 06/16/2018    Class: Chronic  . Osteochondral talar dome lesion 06/16/2018    Class: Chronic  . Deep incisional surgical site infection   . Superficial incisional surgical site infection 06/11/2018    Class: Acute  . Right ankle effusion 06/11/2018    Class: Acute  . Morbid (severe) obesity due to excess calories (HCC) 05/29/2018    Class: Chronic  . Spondylolisthesis, lumbar region 05/27/2018    Class: Chronic  . Spinal stenosis of lumbar region 05/27/2018    Class: Chronic  . Urinary tract infection 05/27/2018    Class: Acute  . Fusion of spine of lumbar region 05/27/2018  . Pain of right hip joint 07/03/2017  . Acute right-sided low back pain with right-sided sciatica 03/27/2017  . Chronic right shoulder pain 02/13/2017  . History of rotator cuff tear 11/30/2016  . Impingement syndrome of right shoulder 11/30/2016  . Exacerbation of asthma 07/18/2016  . Hypokalemia 07/18/2016  . Hypertension 07/18/2016  . Depression with anxiety 07/18/2016  . Hypothyroidism 07/18/2016  . Hyperglycemia 07/18/2016  . Acute asthma exacerbation 07/18/2016  . Surgical wound dehiscence left hip; questionable superficial infection 11/10/2015  . Left hip postoperative wound infection 11/10/2015  .  Osteoarthritis of left hip 09/09/2015  . 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 Obesity (BMI 35.0-39.9 without comorbidity) 04/28/2013   Past Medical History:  Diagnosis Date  . Anemia    taking iron now. pt states having no current issues 09/02/2015  . Anginal pain (HCC)    pt states experiences chest wall pain pt states related to her asthma   . Anxiety    with MRI's  . Arthritis    Everywhere  . Asthma   . Depression   . Dizziness   . GERD (gastroesophageal reflux disease)   . Headache(784.0)    HX OF MIGRAINES  . History of bronchitis   . Hypertension   . Hypothyroidism    takes levothyroxen  . OSA on CPAP    wears cpap  . Shortness of breath    with exertion  .  Wears glasses     Family History  Problem Relation Age of Onset  . Cancer Mother        colon  . Epilepsy Mother   . Cancer Father        prostate  . Diabetes Father   . Hypertension Father   . Hypertension Maternal Aunt   . Diabetes Maternal Aunt   . Hypertension Paternal Aunt     Past Surgical History:  Procedure Laterality Date  . BACK SURGERY    . CESAREAN SECTION     times 2  . CHOLECYSTECTOMY    . ENDOMETRIAL ABLATION    . INCISION AND DRAINAGE HIP Left 11/10/2015   Procedure: IRRIGATION AND DEBRIDEMENT LEFT HIP INCISION;  Surgeon: Kathryne Hitch, MD;  Location: [facility];  Service: Orthopedics;  Laterality: Left;  . JOINT REPLACEMENT  2011   total left knee  . KNEE ARTHROPLASTY  04/23/2012   right   . KNEE ARTHROSCOPY    . LUMBAR LAMINECTOMY/DECOMPRESSION MICRODISCECTOMY N/A 06/23/2019   Procedure: RIGHT LUMBAR FIVE THROUGH SACRAL ONE PARTIAL HEMILAMINECTOMY WITH RIGHT LUMBAR FIVE FORAMINOTOMY;  Surgeon: Kerrin Champagne, MD;  Location: [facility];  Service: Orthopedics;  Laterality: N/A;  . LUMBAR WOUND DEBRIDEMENT N/A 06/11/2018   Procedure: LUMBAR WOUND DEBRIDEMENT;  Surgeon: Kerrin Champagne, MD;  Location: [facility] OR;  Service: Orthopedics;  Laterality: N/A;  . RADIOLOGY WITH  ANESTHESIA N/A 09/09/2018   Procedure: LUMBER SPINE WITHOUT CONTRAST;  Surgeon: Radiologist, Medication, MD;  Location: [facility];  Service: Radiology;  Laterality: N/A;  . ROTATOR CUFF REPAIR     left   . SHOULDER SURGERY     right to repair ligament tear  . TOTAL HIP ARTHROPLASTY Left 09/09/2015   Procedure: LEFT TOTAL HIP ARTHROPLASTY ANTERIOR APPROACH;  Surgeon: Kathryne Hitch, MD;  Location: [facility];  Service: Orthopedics;  Laterality: Left;  . TOTAL KNEE ARTHROPLASTY  04/23/2012   Procedure: TOTAL KNEE ARTHROPLASTY;  Surgeon: [name], MD;  Location: [facility];  Service: Orthopedics;  Laterality: Right;  Right Total Knee Arthroplasty  . TUBAL LIGATION  1996   Social History   Occupational History  . Not on file  Tobacco Use  . Smoking status: Former Smoker    Packs/day: 0.50    Years: 4.00    Pack years: 2.00    Quit date: 09/18/1991    Years since quitting: 29.1  . Smokeless tobacco: Never Used  Vaping Use  . Vaping Use: Never used  Substance and Sexual Activity  . Alcohol use: No  . Drug use: No  . Sexual activity: Yes    Birth control/protection: Surgical

## 2020-11-17 ENCOUNTER — Ambulatory Visit (INDEPENDENT_AMBULATORY_CARE_PROVIDER_SITE_OTHER): Payer: 59 | Admitting: Orthopedic Surgery

## 2020-11-17 DIAGNOSIS — G8929 Other chronic pain: Secondary | ICD-10-CM

## 2020-11-17 DIAGNOSIS — M25511 Pain in right shoulder: Secondary | ICD-10-CM

## 2020-11-17 DIAGNOSIS — M25512 Pain in left shoulder: Secondary | ICD-10-CM

## 2020-11-18 ENCOUNTER — Encounter: Payer: Self-pay | Admitting: Orthopedic Surgery

## 2020-11-18 NOTE — Progress Notes (Signed)
Office Visit Note   Patient: Marilyn [name]           Date of Birth: 29-Oct-1970           MRN: [id number] Visit Date: 11/17/2020              Requested by: Rebecka Apley, NP [address] [place],  [place] [address] PCP: Rebecka Apley, NP  Chief Complaint  Patient presents with  . Right Shoulder - Follow-up    10/27/20 s/p bilateral shoulder injections   . Left Shoulder - Follow-up      HPI: Patient is a 50 year old woman who presents in follow-up for bilateral shoulder pain. She is status post bilateral injections a month ago. She states that she still has shoulder pain with decreased range of motion. She has undergone 2 previous arthroscopic debridements.  Assessment & Plan: Visit Diagnoses:  1. Chronic pain of both shoulders     Plan: We will set her up for physical therapy.  Follow-Up Instructions: Return in about 4 weeks (around 12/15/2020).   Ortho Exam  Patient is alert, oriented, no adenopathy, well-dressed, normal affect, normal respiratory effort. Examination she does have adhesive capsulitis worse on the left than the right shoulder left shoulder has abduction flexion to 70 degrees right shoulder abduction flexion to 90 degrees. She has pain with Neer and Hawkins impingement test there is no redness no cellulitis no signs of infection.  Imaging: No results found. No images are attached to the encounter.  Labs: Lab Results  Component Value Date   HGBA1C 6.2 (H) 07/18/2016   HGBA1C 6.2 (H) 09/02/2015   ESRSEDRATE 56 (H) 01/08/2019   ESRSEDRATE CANCELED 09/18/2018   ESRSEDRATE 67 (H) 08/18/2018   CRP 30.7 (H) 01/08/2019   CRP 16.3 (H) 09/18/2018   CRP 20.9 (H) 08/18/2018   LABURIC 5.9 01/08/2019   LABURIC 8.7 (H) 06/16/2018   REPTSTATUS 05/22/2019 FINAL 05/20/2019   GRAMSTAIN  06/11/2018    FEW WBC PRESENT,BOTH PMN AND MONONUCLEAR NO ORGANISMS SEEN    CULT  05/20/2019    Multiple bacterial morphotypes present, none  predominant. Suggest appropriate recollection if clinically indicated.   LABORGA PSEUDOMONAS AERUGINOSA 06/11/2018     Lab Results  Component Value Date   ALBUMIN 3.9 06/17/2019   ALBUMIN 4.1 05/20/2019   ALBUMIN 3.3 (L) 07/10/2018   LABURIC 5.9 01/08/2019   LABURIC 8.7 (H) 06/16/2018    Lab Results  Component Value Date   MG 1.7 05/20/2019   No results found for: VD25OH  No results found for: PREALBUMIN CBC EXTENDED Latest Ref Rng & Units 12/09/2019 06/24/2019 06/17/2019  WBC 4.0 - 10.5 K/uL 6.3 10.2 7.3  RBC 3.87 - 5.11 MIL/uL 4.03 3.77(L) 4.37  HGB 12.0 - 15.0 g/dL 11.2(L) 10.9(L) 12.3  HCT 36.0 - 46.0 % 37.8 34.5(L) 40.6  PLT 150 - 400 K/uL 225 265 294  NEUTROABS 1.7 - 7.7 K/uL 3.3 - -  LYMPHSABS 0.7 - 4.0 K/uL 2.2 - -     There is no height or weight on file to calculate BMI.  Orders:  No orders of the defined types were placed in this encounter.  No orders of the defined types were placed in this encounter.    Procedures: No procedures performed  Clinical Data: No additional findings.  ROS:  All other systems negative, except as noted in the HPI. Review of Systems  Objective: Vital Signs: There were no vitals taken for this visit.  Specialty  Comments:  No specialty comments available.  PMFS History: Patient Active Problem List   Diagnosis Date Noted  . Leg pain, right 10/20/2019  . Dysesthesia 10/20/2019  . 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 Other spondylosis with radiculopathy, lumbar region   . Status post lumbar laminectomy 06/23/2019  . C. difficile colitis 05/22/2019  . Hypophosphatemia 05/21/2019  . Syncope 05/20/2019  . OSA on CPAP   . Acute gastroenteritis   . Anemia due to blood loss, acute 06/18/2018    Class: Acute  . Plantar fasciitis of left foot 06/16/2018    Class: Chronic  . Tendonitis, Achilles, right 06/16/2018    Class: Chronic  . Osteochondral talar dome lesion 06/16/2018    Class: Chronic  .  Deep incisional surgical site infection   . Superficial incisional surgical site infection 06/11/2018    Class: Acute  . Right ankle effusion 06/11/2018    Class: Acute  . Morbid (severe) obesity due to excess calories (HCC) 05/29/2018    Class: Chronic  . Spondylolisthesis, lumbar region 05/27/2018    Class: Chronic  . Spinal stenosis of lumbar region 05/27/2018    Class: Chronic  . Urinary tract infection 05/27/2018    Class: Acute  . Fusion of spine of lumbar region 05/27/2018  . Pain of right hip joint 07/03/2017  . Acute right-sided low back pain with right-sided sciatica 03/27/2017  . Chronic right shoulder pain 02/13/2017  . History of rotator cuff tear 11/30/2016  . Impingement syndrome of right shoulder 11/30/2016  . Exacerbation of asthma 07/18/2016  . Hypokalemia 07/18/2016  . Hypertension 07/18/2016  . Depression with anxiety 07/18/2016  . Hypothyroidism 07/18/2016  . Hyperglycemia 07/18/2016  . Acute asthma exacerbation 07/18/2016  . Surgical wound dehiscence left hip; questionable superficial infection 11/10/2015  . Left hip postoperative wound infection 11/10/2015  . Osteoarthritis of left hip 09/09/2015  . 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 Obesity (BMI 35.0-39.9 without comorbidity) 04/28/2013   Past Medical History:  Diagnosis Date  . Anemia    taking iron now. pt states having no current issues 09/02/2015  . Anginal pain (HCC)    pt states experiences chest wall pain pt states related to her asthma   . Anxiety    with MRI's  . Arthritis    Everywhere  . Asthma   . Depression   . Dizziness   . GERD (gastroesophageal reflux disease)   . Headache(784.0)    HX OF MIGRAINES  . History of bronchitis   . Hypertension   . Hypothyroidism    takes levothyroxen  . OSA on CPAP    wears cpap  . Shortness of breath    with exertion  . Wears glasses     Family History  Problem Relation Age of Onset  . Cancer Mother        colon  .  Epilepsy Mother   . Cancer Father        prostate  . Diabetes Father   . Hypertension Father   . Hypertension Maternal Aunt   . Diabetes Maternal Aunt   . Hypertension Paternal Aunt     Past Surgical History:  Procedure Laterality Date  . BACK SURGERY    . CESAREAN SECTION     times 2  . CHOLECYSTECTOMY    . ENDOMETRIAL ABLATION    . INCISION AND DRAINAGE HIP Left 11/10/2015   Procedure: IRRIGATION AND DEBRIDEMENT LEFT HIP INCISION;  Surgeon: Kathryne Hitch, MD;  Location: [facility] OR;  Service: Orthopedics;  Laterality: Left;  . JOINT REPLACEMENT  2011   total left knee  . KNEE ARTHROPLASTY  04/23/2012   right   . KNEE ARTHROSCOPY    . LUMBAR LAMINECTOMY/DECOMPRESSION MICRODISCECTOMY N/A 06/23/2019   Procedure: RIGHT LUMBAR FIVE THROUGH SACRAL ONE PARTIAL HEMILAMINECTOMY WITH RIGHT LUMBAR FIVE FORAMINOTOMY;  Surgeon: Kerrin Champagne, MD;  Location: [facility] OR;  Service: Orthopedics;  Laterality: N/A;  . LUMBAR WOUND DEBRIDEMENT N/A 06/11/2018   Procedure: LUMBAR WOUND DEBRIDEMENT;  Surgeon: Kerrin Champagne, MD;  Location: [facility] OR;  Service: Orthopedics;  Laterality: N/A;  . RADIOLOGY WITH ANESTHESIA N/A 09/09/2018   Procedure: LUMBER SPINE WITHOUT CONTRAST;  Surgeon: Radiologist, Medication, MD;  Location: [facility] OR;  Service: Radiology;  Laterality: N/A;  . ROTATOR CUFF REPAIR     left   . SHOULDER SURGERY     right to repair ligament tear  . TOTAL HIP ARTHROPLASTY Left 09/09/2015   Procedure: LEFT TOTAL HIP ARTHROPLASTY ANTERIOR APPROACH;  Surgeon: Kathryne Hitch, MD;  Location: [facility];  Service: Orthopedics;  Laterality: Left;  . TOTAL KNEE ARTHROPLASTY  04/23/2012   Procedure: TOTAL KNEE ARTHROPLASTY;  Surgeon: [name], MD;  Location: [facility] OR;  Service: Orthopedics;  Laterality: Right;  Right Total Knee Arthroplasty  . TUBAL LIGATION  1996   Social History   Occupational History  . Not on file  Tobacco Use  . Smoking status: Former Smoker    Packs/day: 0.50    Years:  4.00    Pack years: 2.00    Quit date: 09/18/1991    Years since quitting: 29.1  . Smokeless tobacco: Never Used  Vaping Use  . Vaping Use: Never used  Substance and Sexual Activity  . Alcohol use: No  . Drug use: No  . Sexual activity: Yes    Birth control/protection: Surgical

## 2020-11-24 ENCOUNTER — Ambulatory Visit: Payer: 59 | Admitting: Physical Therapy

## 2020-11-28 ENCOUNTER — Ambulatory Visit: Payer: 59 | Attending: Orthopedic Surgery | Admitting: Physical Therapy

## 2020-11-28 ENCOUNTER — Encounter: Payer: Self-pay | Admitting: Physical Therapy

## 2020-11-28 ENCOUNTER — Other Ambulatory Visit: Payer: Self-pay

## 2020-11-28 DIAGNOSIS — M25611 Stiffness of right shoulder, not elsewhere classified: Secondary | ICD-10-CM | POA: Diagnosis present

## 2020-11-28 DIAGNOSIS — M25511 Pain in right shoulder: Secondary | ICD-10-CM | POA: Diagnosis not present

## 2020-11-28 DIAGNOSIS — M25612 Stiffness of left shoulder, not elsewhere classified: Secondary | ICD-10-CM | POA: Diagnosis present

## 2020-11-28 DIAGNOSIS — M25512 Pain in left shoulder: Secondary | ICD-10-CM | POA: Insufficient documentation

## 2020-11-28 DIAGNOSIS — G8929 Other chronic pain: Secondary | ICD-10-CM | POA: Diagnosis present

## 2020-11-28 NOTE — Therapy (Signed)
[facility] [address] [place], [place], [address] Phone: [phone]   Fax:  [phone]  Physical Therapy Evaluation  Patient Details  Name: Marilyn [name] MRN: [id number] Date of Birth: 08-03-1971 Referring Provider (PT): Aldean BakerMarcus Duda MD.   Encounter Date: 11/28/2020   PT End of Session - 11/28/20 1549    Visit Number 1    Number of Visits 12    Date for PT Re-Evaluation 02/26/21    Authorization Type PROGRESS NOTE AT 10TH VISIT.  KX MODIFIER AFTER 15 VISITS.    PT Start Time 1118    PT Stop Time 1207    PT Time Calculation (min) 49 min    Activity Tolerance Patient tolerated treatment well    Behavior During Therapy WFL for tasks assessed/performed           Past Medical History:  Diagnosis Date  . Anemia    taking iron now. pt states having no current issues 09/02/2015  . Anginal pain (HCC)    pt states experiences chest wall pain pt states related to her asthma   . Anxiety    with MRI's  . Arthritis    Everywhere  . Asthma   . Depression   . Dizziness   . GERD (gastroesophageal reflux disease)   . Headache(784.0)    HX OF MIGRAINES  . History of bronchitis   . Hypertension   . Hypothyroidism    takes levothyroxen  . OSA on CPAP    wears cpap  . Shortness of breath    with exertion  . Wears glasses     Past Surgical History:  Procedure Laterality Date  . BACK SURGERY    . CESAREAN SECTION     times 2  . CHOLECYSTECTOMY    . ENDOMETRIAL ABLATION    . INCISION AND DRAINAGE HIP Left 11/10/2015   Procedure: IRRIGATION AND DEBRIDEMENT LEFT HIP INCISION;  Surgeon: Kathryne Hitchhristopher Y Blackman, MD;  Location: [facility] OR;  Service: Orthopedics;  Laterality: Left;  . JOINT REPLACEMENT  2011   total left knee  . KNEE ARTHROPLASTY  04/23/2012   right   . KNEE ARTHROSCOPY    . LUMBAR LAMINECTOMY/DECOMPRESSION MICRODISCECTOMY N/A 06/23/2019   Procedure: RIGHT LUMBAR FIVE THROUGH SACRAL ONE PARTIAL HEMILAMINECTOMY WITH  RIGHT LUMBAR FIVE FORAMINOTOMY;  Surgeon: Kerrin ChampagneNitka, James E, MD;  Location: [facility] OR;  Service: Orthopedics;  Laterality: N/A;  . LUMBAR WOUND DEBRIDEMENT N/A 06/11/2018   Procedure: LUMBAR WOUND DEBRIDEMENT;  Surgeon: Kerrin ChampagneNitka, James E, MD;  [facility];  Service: Orthopedics;  Laterality: N/A;  . RADIOLOGY WITH ANESTHESIA N/A 09/09/2018   Procedure: LUMBER SPINE WITHOUT CONTRAST;  Surgeon: Radiologist, Medication, MD;  Location: [facility] OR;  Service: Radiology;  Laterality: N/A;  . ROTATOR CUFF REPAIR     left   . SHOULDER SURGERY     right to repair ligament tear  . TOTAL HIP ARTHROPLASTY Left 09/09/2015   Procedure: LEFT TOTAL HIP ARTHROPLASTY ANTERIOR APPROACH;  Surgeon: Kathryne Hitchhristopher Y Blackman, MD;  Location: [facility];  Service: Orthopedics;  Laterality: Left;  . TOTAL KNEE ARTHROPLASTY  04/23/2012   Procedure: TOTAL KNEE ARTHROPLASTY;  Surgeon: Nadara MustardMarcus V Duda, MD;  Location: [facility] OR;  Service: Orthopedics;  Laterality: Right;  Right Total Knee Arthroplasty  . TUBAL LIGATION  1996    There were no vitals filed for this visit.    Subjective Assessment - 11/28/20 1555    Subjective COVID-19 screen performed prior to patient entering clinic.  The patient  presents to the clinic with c/o bilateral shoulder pain.  She has a long h/o pain and has had shoulder surgeries in the past.  She reports more pain on left than right.  She states she feels like the left shoulder "locks up."  Pain is rated at a 5/10 today and higher with active movement of her shoulders.  She has found nothing really deceases her pain.    Pertinent History Hypothyroidism, back surgery, bilateral TKA's, left THA, bilateral shoulder surgeries.    Diagnostic tests X-rays.    Patient Stated Goals Improve shoulder range of motion with decreased pain.    Currently in Pain? Yes    Pain Score 5     Pain Location Shoulder    Pain Orientation Right;Left    Pain Descriptors / Indicators Aching;Sore;Sharp;Numbness;Shooting    Pain Type Chronic  pain    Pain Onset More than a month ago    Pain Frequency Constant    Aggravating Factors  See above.    Pain Relieving Factors See above.              [facility] PT Assessment - 11/28/20 0001      Assessment   Medical Diagnosis Chronic pain of both shoulders.    Referring Provider (PT) Aldean Baker MD.    Onset Date/Surgical Date --   Ongoing.     Precautions   Precautions None      Restrictions   Weight Bearing Restrictions No      Balance Screen   Has the patient fallen in the past 6 months Yes    How many times? 2.    Has the patient had a decrease in activity level because of a fear of falling?  Yes    Is the patient reluctant to leave their home because of a fear of falling?  Yes      Home Environment   Living Environment Private residence      Prior Function   Level of Independence Independent      Posture/Postural Control   Posture/Postural Control Postural limitations    Postural Limitations Rounded Shoulders;Forward head      ROM / Strength   AROM / PROM / Strength AROM;Strength      AROM   Overall AROM Comments Active-assisitve left shoulder flexion to 90 degrees, ER is 25 degrees and no behind the back movement.  Right shoulder flexion actively-assistively is 150 degrees and ER is 48 degrees with no behind the back movement.      Strength   Overall Strength Comments Isometric testing with elbow by sides, IR/ER is 4-4+/5.  Deltoid strength is 4-/5.      Palpation   Palpation comment Tender to palpation over left anterior shoulder.  Pain reported "in" left shoulder.      Special Tests   Other special tests Pain with shoulder Impingement testing.                      Objective measurements completed on examination: See above findings.       [facility] Adult PT Treatment/Exercise - 11/28/20 0001      Modalities   Modalities Electrical Stimulation;Moist Heat      Moist Heat Therapy   Number Minutes Moist Heat 15 Minutes    Moist Heat  Location --   Left shoulder.     Electrical Stimulation   Electrical Stimulation Location Left anterior shoulder.    Electrical Stimulation Action Pre-mod.    Statistician  Parameters 80-150 Hz x 15 minutes.    Electrical Stimulation Goals Pain                       PT Long Term Goals - 11/28/20 1620      PT LONG TERM GOAL #1   Title Patient will be independent with HEP    Period Weeks    Status New      PT LONG TERM GOAL #2   Title Bilateral active shoulder flexion to 145 degrees so the patient can easily reach overhead.    Time 6    Period Weeks    Status New      PT LONG TERM GOAL #3   Title Bilateral active shoulder ER to 70 degrees+ to allow for easily donning/doffing of apparel.    Time 6    Period Weeks    Status New      PT LONG TERM GOAL #4   Title Increase ROM so patient is able to reach behind back.    Time 6    Period Weeks    Status New      PT LONG TERM GOAL #5   Title Perform ADL's with pain not > 4/10.    Time 6    Period Weeks    Status New                  Plan - 11/28/20 1613    Clinical Impression Statement The patient presents to [facility] with c/o chronic bilateral shoulder pain, left > right.  She has significnat losses of motion and a subsquent loss of functional use of both shoulders.  She exbits no behind the back motions.  She is tender to palpation over her left anterior shoulder region.  She demonstrates positive Impingement testing of her shoulders.  Patient will benefit from skilled physical therapy intervention to address pain and deficits.    Personal Factors and Comorbidities Comorbidity 1;Comorbidity 2;Other    Comorbidities Hypothyroidism, back surgery, bilateral TKA's, left THA, bilateral shoulder surgeries.    Examination-Activity Limitations Other;Reach Overhead    Examination-Participation Restrictions Other    Stability/Clinical Decision Making Evolving/Moderate complexity    Clinical Decision Making  Low    Rehab Potential Fair    PT Frequency 2x / week    PT Duration 6 weeks    PT Treatment/Interventions ADLs/Self Care Home Management;Cryotherapy;Electrical Stimulation;Ultrasound;Moist Heat;Iontophoresis 4mg /ml Dexamethasone;Therapeutic activities;Therapeutic exercise;Patient/family education;Manual techniques;Passive range of motion;Dry needling    PT Next Visit Plan Pulleys, UE Ranger, PROM to bilateral shoulder, wall ladder.  PRE's.  Modalities and STW/M as needed.    Consulted and Agree with Plan of Care Patient           Patient will benefit from skilled therapeutic intervention in order to improve the following deficits and impairments:  Pain,Decreased activity tolerance,Postural dysfunction,Decreased range of motion,Decreased strength  Visit Diagnosis: Chronic right shoulder pain - Plan: PT plan of care cert/re-cert  Chronic left shoulder pain - Plan: PT plan of care cert/re-cert  Stiffness of left shoulder, not elsewhere classified - Plan: PT plan of care cert/re-cert  Stiffness of right shoulder, not elsewhere classified - Plan: PT plan of care cert/re-cert     Problem List Patient Active Problem List   Diagnosis Date Noted  . Leg pain, right 10/20/2019  . Dysesthesia 10/20/2019  . 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 Other spondylosis with radiculopathy, lumbar region   . Status post lumbar laminectomy 06/23/2019  .  C. difficile colitis 05/22/2019  . Hypophosphatemia 05/21/2019  . Syncope 05/20/2019  . OSA on CPAP   . Acute gastroenteritis   . Anemia due to blood loss, acute 06/18/2018    Class: Acute  . Plantar fasciitis of left foot 06/16/2018    Class: Chronic  . Tendonitis, Achilles, right 06/16/2018    Class: Chronic  . Osteochondral talar dome lesion 06/16/2018    Class: Chronic  . Deep incisional surgical site infection   . Superficial incisional surgical site infection 06/11/2018    Class: Acute  . Right ankle effusion  06/11/2018    Class: Acute  . Morbid (severe) obesity due to excess calories (HCC) 05/29/2018    Class: Chronic  . Spondylolisthesis, lumbar region 05/27/2018    Class: Chronic  . Spinal stenosis of lumbar region 05/27/2018    Class: Chronic  . Urinary tract infection 05/27/2018    Class: Acute  . Fusion of spine of lumbar region 05/27/2018  . Pain of right hip joint 07/03/2017  . Acute right-sided low back pain with right-sided sciatica 03/27/2017  . Chronic right shoulder pain 02/13/2017  . History of rotator cuff tear 11/30/2016  . Impingement syndrome of right shoulder 11/30/2016  . Exacerbation of asthma 07/18/2016  . Hypokalemia 07/18/2016  . Hypertension 07/18/2016  . Depression with anxiety 07/18/2016  . Hypothyroidism 07/18/2016  . Hyperglycemia 07/18/2016  . Acute asthma exacerbation 07/18/2016  . Surgical wound dehiscence left hip; questionable superficial infection 11/10/2015  . Left hip postoperative wound infection 11/10/2015  . Osteoarthritis of left hip 09/09/2015  . 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 Obesity (BMI 35.0-39.9 without comorbidity) 04/28/2013    Mujahid Jalomo, [place]  MPT 11/28/2020, 4:23 PM  [facility] [address] [place], [place], [address] Phone: [phone]   Fax:  [phone]  Name: CHERESA SIERS MRN: [id number] Date of Birth: August 07, 1971

## 2020-11-30 ENCOUNTER — Other Ambulatory Visit: Payer: Self-pay

## 2020-11-30 ENCOUNTER — Ambulatory Visit: Payer: 59 | Admitting: Physical Therapy

## 2020-11-30 DIAGNOSIS — M25512 Pain in left shoulder: Secondary | ICD-10-CM

## 2020-11-30 DIAGNOSIS — M25612 Stiffness of left shoulder, not elsewhere classified: Secondary | ICD-10-CM

## 2020-11-30 DIAGNOSIS — M25511 Pain in right shoulder: Secondary | ICD-10-CM | POA: Diagnosis not present

## 2020-11-30 DIAGNOSIS — G8929 Other chronic pain: Secondary | ICD-10-CM

## 2020-11-30 DIAGNOSIS — M25611 Stiffness of right shoulder, not elsewhere classified: Secondary | ICD-10-CM

## 2020-11-30 NOTE — Patient Instructions (Signed)
   Doorway (Pectoralis) Stretch  Stand in a doorway Place both arms and elbows on each side of the frame One foot in front of the other (Protects lower back) Lean into doorway until you feel a stretch on both sides. 20sec holds 3-5 reps 1-4 times a day  Closed chain flexion stretch  Step 1: Stand facing table/countertop with both hands on table/countertop.  Step 2: Keep hands on table/countertop and step back away from table/countertop to stretch the shoulders. 20 sec hold 3-5 reps 1-4 times day    INTERNAL ROTATION TOWEL STRETCH - IR TOWEL  Gently pull up your affected arm behind your back with the assist of a towel. Hold this as a stretch, then lower back down and repeat. 20 sec hold 3-5 reps 1-4 times a day

## 2020-11-30 NOTE — Therapy (Signed)
[facility] [address] [place], [place], [address] Phone: [phone]   Fax:  [phone]  Physical Therapy Treatment  Patient Details  Name: Marilyn [name] MRN: [id number] Date of Birth: [birth year] Referring Provider (PT): Aldean Baker MD.   Encounter Date: 11/30/2020   PT End of Session - 11/30/20 1444    Visit Number 2    Number of Visits 12    Date for PT Re-Evaluation 02/26/21    Authorization Type PROGRESS NOTE AT 10TH VISIT.  KX MODIFIER AFTER 15 VISITS.    PT Start Time 0235    PT Stop Time 0317    PT Time Calculation (min) 42 min    Activity Tolerance Patient tolerated treatment well    Behavior During Therapy [facility] for tasks assessed/performed           Past Medical History:  Diagnosis Date  . Anemia    taking iron now. pt states having no current issues 09/02/2015  . Anginal pain (HCC)    pt states experiences chest wall pain pt states related to her asthma   . Anxiety    with MRI's  . Arthritis    Everywhere  . Asthma   . Depression   . Dizziness   . GERD (gastroesophageal reflux disease)   . Headache(784.0)    HX OF MIGRAINES  . History of bronchitis   . Hypertension   . Hypothyroidism    takes levothyroxen  . OSA on CPAP    wears cpap  . Shortness of breath    with exertion  . Wears glasses     Past Surgical History:  Procedure Laterality Date  . BACK SURGERY    . CESAREAN SECTION     times 2  . CHOLECYSTECTOMY    . ENDOMETRIAL ABLATION    . INCISION AND DRAINAGE HIP Left 11/10/2015   Procedure: IRRIGATION AND DEBRIDEMENT LEFT HIP INCISION;  Surgeon: Kathryne Hitch, MD;  [facility];  Service: Orthopedics;  Laterality: Left;  . JOINT REPLACEMENT  2011   total left knee  . KNEE ARTHROPLASTY  04/23/2012   right   . KNEE ARTHROSCOPY    . LUMBAR LAMINECTOMY/DECOMPRESSION MICRODISCECTOMY N/A 06/23/2019   Procedure: RIGHT LUMBAR FIVE THROUGH SACRAL ONE PARTIAL HEMILAMINECTOMY WITH  RIGHT LUMBAR FIVE FORAMINOTOMY;  Surgeon: Kerrin Champagne, MD;  [facility]: [facility] OR;  Service: Orthopedics;  Laterality: N/A;  . LUMBAR WOUND DEBRIDEMENT N/A 06/11/2018   Procedure: LUMBAR WOUND DEBRIDEMENT;  Surgeon: Kerrin Champagne, MD;  Location: [facility] OR;  Service: Orthopedics;  Laterality: N/A;  . RADIOLOGY WITH ANESTHESIA N/A 09/09/2018   Procedure: LUMBER SPINE WITHOUT CONTRAST;  Surgeon: Radiologist, Medication, MD;  Location: [facility] OR;  Service: Radiology;  Laterality: N/A;  . ROTATOR CUFF REPAIR     left   . SHOULDER SURGERY     right to repair ligament tear  . TOTAL HIP ARTHROPLASTY Left 09/09/2015   Procedure: LEFT TOTAL HIP ARTHROPLASTY ANTERIOR APPROACH;  Surgeon: Kathryne Hitch, MD;  Location: [facility];  Service: Orthopedics;  Laterality: Left;  . TOTAL KNEE ARTHROPLASTY  04/23/2012   Procedure: TOTAL KNEE ARTHROPLASTY;  Surgeon: [name], MD;  Location: [facility] OR;  Service: Orthopedics;  Laterality: Right;  Right Total Knee Arthroplasty  . TUBAL LIGATION  1996    There were no vitals filed for this visit.   Subjective Assessment - 11/30/20 1437    Subjective COVID-19 screen performed prior to patient entering clinic.  Patient reported some  ongoing discomfort today.    Pertinent History Hypothyroidism, back surgery, bilateral TKA's, left THA, bilateral shoulder surgeries.    Diagnostic tests X-rays.    Patient Stated Goals Improve shoulder range of motion with decreased pain.    Currently in Pain? Yes    Pain Score 4     Pain Location Shoulder    Pain Orientation Right;Left    Pain Descriptors / Indicators Discomfort;Sore    Pain Type Chronic pain    Pain Onset More than a month ago    Pain Frequency Constant    Aggravating Factors  overhead movement    Pain Relieving Factors rest                             [facility] Adult PT Treatment/Exercise - 11/30/20 0001      Exercises   Exercises Shoulder      Shoulder Exercises: Pulleys   Flexion 5 minutes     Other Pulley Exercises seated UE ranger for cirles x71min each UE      Shoulder Exercises: Stretch   Corner Stretch 4 reps;20 seconds   BIL shoulders   Table Stretch - Flexion 4 reps;20 seconds   BIL shoulders   Other Shoulder Stretches Towel stretch bil UE 20 sec x4 each side      Moist Heat Therapy   Number Minutes Moist Heat 15 Minutes    Moist Heat Location Shoulder   Bil     Electrical Stimulation   Electrical Stimulation Location bil anterior shoulder.    Electrical Stimulation Action premod    Electrical Stimulation Parameters 80+-150hz  x35min    Electrical Stimulation Goals Pain                  PT Education - 11/30/20 1457    Education Details HEP ROM    Person(s) Educated Patient    Methods Explanation;Demonstration;Handout    Comprehension Verbalized understanding;Returned demonstration               PT Long Term Goals - 11/30/20 1446      PT LONG TERM GOAL #1   Title Patient will be independent with HEP    Time 6    Period Weeks    Status On-going      PT LONG TERM GOAL #2   Title Bilateral active shoulder flexion to 145 degrees so the patient can easily reach overhead.    Time 6    Period Weeks    Status On-going      PT LONG TERM GOAL #3   Title Bilateral active shoulder ER to 70 degrees+ to allow for easily donning/doffing of apparel.    Time 6    Period Weeks    Status On-going      PT LONG TERM GOAL #4   Title Increase ROM so patient is able to reach behind back.    Time 6    Period Weeks    Status On-going      PT LONG TERM GOAL #5   Title Perform ADL's with pain not > 4/10.    Time 6    Period Weeks    Status On-going                 Plan - 11/30/20 1507    Clinical Impression Statement Patient tolerated treatment well today with 6-7/10 pain in bil shoulders. Today focused on ROM and established an HEP for home progression for bil  shoulders to improve mobility. Today educated patient on self stretches and improving  mobility. Patient didi well with initial HEP given today. Goals ongoing a at this time. good response to modalities today.    Personal Factors and Comorbidities Comorbidity 1;Comorbidity 2;Other    Comorbidities Hypothyroidism, back surgery, bilateral TKA's, left THA, bilateral shoulder surgeries.    Examination-Activity Limitations Other;Reach Overhead    Examination-Participation Restrictions Other    Stability/Clinical Decision Making Evolving/Moderate complexity    Rehab Potential Fair    PT Frequency 2x / week    PT Duration 6 weeks    PT Treatment/Interventions ADLs/Self Care Home Management;Cryotherapy;Electrical Stimulation;Ultrasound;Moist Heat;Iontophoresis 4mg /ml Dexamethasone;Therapeutic activities;Therapeutic exercise;Patient/family education;Manual techniques;Passive range of motion;Dry needling    PT Next Visit Plan cont with POC for Pulleys, UE Ranger, PROM to bilateral shoulder, wall ladder.  PRE's.  Modalities and STW/M as needed.    Consulted and Agree with Plan of Care Patient           Patient will benefit from skilled therapeutic intervention in order to improve the following deficits and impairments:  Pain,Decreased activity tolerance,Postural dysfunction,Decreased range of motion,Decreased strength  Visit Diagnosis: Chronic right shoulder pain  Chronic left shoulder pain  Stiffness of left shoulder, not elsewhere classified  Stiffness of right shoulder, not elsewhere classified     Problem List Patient Active Problem List   Diagnosis Date Noted  . Leg pain, right 10/20/2019  . Dysesthesia 10/20/2019  . 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 Other spondylosis with radiculopathy, lumbar region   . Status post lumbar laminectomy 06/23/2019  . C. difficile colitis 05/22/2019  . Hypophosphatemia 05/21/2019  . Syncope 05/20/2019  . OSA on CPAP   . Acute gastroenteritis   . Anemia due to blood loss, acute 06/18/2018    Class: Acute  .  Plantar fasciitis of left foot 06/16/2018    Class: Chronic  . Tendonitis, Achilles, right 06/16/2018    Class: Chronic  . Osteochondral talar dome lesion 06/16/2018    Class: Chronic  . Deep incisional surgical site infection   . Superficial incisional surgical site infection 06/11/2018    Class: Acute  . Right ankle effusion 06/11/2018    Class: Acute  . Morbid (severe) obesity due to excess calories (HCC) 05/29/2018    Class: Chronic  . Spondylolisthesis, lumbar region 05/27/2018    Class: Chronic  . Spinal stenosis of lumbar region 05/27/2018    Class: Chronic  . Urinary tract infection 05/27/2018    Class: Acute  . Fusion of spine of lumbar region 05/27/2018  . Pain of right hip joint 07/03/2017  . Acute right-sided low back pain with right-sided sciatica 03/27/2017  . Chronic right shoulder pain 02/13/2017  . History of rotator cuff tear 11/30/2016  . Impingement syndrome of right shoulder 11/30/2016  . Exacerbation of asthma 07/18/2016  . Hypokalemia 07/18/2016  . Hypertension 07/18/2016  . Depression with anxiety 07/18/2016  . Hypothyroidism 07/18/2016  . Hyperglycemia 07/18/2016  . Acute asthma exacerbation 07/18/2016  . Surgical wound dehiscence left hip; questionable superficial infection 11/10/2015  . Left hip postoperative wound infection 11/10/2015  . Osteoarthritis of left hip 09/09/2015  . 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 Obesity (BMI 35.0-39.9 without comorbidity) 04/28/2013    Kaycen Whitworth P, PTA 11/30/2020, 3:20 PM  [facility] [address] [place], [place], [place] Phone: [phone]   Fax:  [phone]  Name: Marilyn [name] MRN: [name] Date of Birth: Mar 15, 1971

## 2020-12-05 ENCOUNTER — Other Ambulatory Visit: Payer: Self-pay

## 2020-12-05 ENCOUNTER — Encounter: Payer: Self-pay | Admitting: Physical Therapy

## 2020-12-05 ENCOUNTER — Ambulatory Visit: Payer: 59 | Admitting: Physical Therapy

## 2020-12-05 DIAGNOSIS — M25611 Stiffness of right shoulder, not elsewhere classified: Secondary | ICD-10-CM

## 2020-12-05 DIAGNOSIS — M25511 Pain in right shoulder: Secondary | ICD-10-CM | POA: Diagnosis not present

## 2020-12-05 DIAGNOSIS — G8929 Other chronic pain: Secondary | ICD-10-CM

## 2020-12-05 DIAGNOSIS — M25512 Pain in left shoulder: Secondary | ICD-10-CM

## 2020-12-05 DIAGNOSIS — M25612 Stiffness of left shoulder, not elsewhere classified: Secondary | ICD-10-CM

## 2020-12-05 NOTE — Therapy (Signed)
[facility] [address] [place], [place], [address] Phone: [phone]   Fax:  [phone]  Physical Therapy Treatment  Patient Details  Name: Marilyn [name] MRN: [id number] Date of Birth: [birth year] Referring Provider (PT): Aldean Baker MD.   Encounter Date: 12/05/2020   PT End of Session - 12/05/20 1123    Visit Number 3    Number of Visits 12    Date for PT Re-Evaluation 02/26/21    Authorization Type PROGRESS NOTE AT 10TH VISIT.  KX MODIFIER AFTER 15 VISITS.    PT Start Time 1120    PT Stop Time 1203    PT Time Calculation (min) 43 min    Activity Tolerance Patient tolerated treatment well    Behavior During Therapy WFL for tasks assessed/performed           Past Medical History:  Diagnosis Date  . Anemia    taking iron now. pt states having no current issues 09/02/2015  . Anginal pain (HCC)    pt states experiences chest wall pain pt states related to her asthma   . Anxiety    with MRI's  . Arthritis    Everywhere  . Asthma   . Depression   . Dizziness   . GERD (gastroesophageal reflux disease)   . Headache(784.0)    HX OF MIGRAINES  . History of bronchitis   . Hypertension   . Hypothyroidism    takes levothyroxen  . OSA on CPAP    wears cpap  . Shortness of breath    with exertion  . Wears glasses     Past Surgical History:  Procedure Laterality Date  . BACK SURGERY    . CESAREAN SECTION     times 2  . CHOLECYSTECTOMY    . ENDOMETRIAL ABLATION    . INCISION AND DRAINAGE HIP Left 11/10/2015   Procedure: IRRIGATION AND DEBRIDEMENT LEFT HIP INCISION;  Surgeon: Kathryne Hitch, MD;  [facility]: [facility] OR;  Service: Orthopedics;  Laterality: Left;  . JOINT REPLACEMENT  2011   total left knee  . KNEE ARTHROPLASTY  04/23/2012   right   . KNEE ARTHROSCOPY    . LUMBAR LAMINECTOMY/DECOMPRESSION MICRODISCECTOMY N/A 06/23/2019   Procedure: RIGHT LUMBAR FIVE THROUGH SACRAL ONE PARTIAL HEMILAMINECTOMY WITH  RIGHT LUMBAR FIVE FORAMINOTOMY;  Surgeon: Kerrin Champagne, MD;  Location: [facility] OR;  Service: Orthopedics;  Laterality: N/A;  . LUMBAR WOUND DEBRIDEMENT N/A 06/11/2018   Procedure: LUMBAR WOUND DEBRIDEMENT;  Surgeon: Kerrin Champagne, MD;  Location: [facility] OR;  Service: Orthopedics;  Laterality: N/A;  . RADIOLOGY WITH ANESTHESIA N/A 09/09/2018   Procedure: LUMBER SPINE WITHOUT CONTRAST;  Surgeon: Radiologist, Medication, MD;  Location: [facility] OR;  Service: Radiology;  Laterality: N/A;  . ROTATOR CUFF REPAIR     left   . SHOULDER SURGERY     right to repair ligament tear  . TOTAL HIP ARTHROPLASTY Left 09/09/2015   Procedure: LEFT TOTAL HIP ARTHROPLASTY ANTERIOR APPROACH;  Surgeon: Kathryne Hitch, MD;  Location: [facility];  Service: Orthopedics;  Laterality: Left;  . TOTAL KNEE ARTHROPLASTY  04/23/2012   Procedure: TOTAL KNEE ARTHROPLASTY;  Surgeon: [name], MD;  Location: [facility] OR;  Service: Orthopedics;  Laterality: Right;  Right Total Knee Arthroplasty  . TUBAL LIGATION  1996    There were no vitals filed for this visit.   Subjective Assessment - 12/05/20 1122    Subjective COVID-19 screen performed prior to patient entering clinic.  Patient reported some  ongoing discomfort today.    Pertinent History Hypothyroidism, back surgery, bilateral TKA's, left THA, bilateral shoulder surgeries.    Diagnostic tests X-rays.    Patient Stated Goals Improve shoulder range of motion with decreased pain.    Currently in Pain? Yes    Pain Score 6     Pain Location Shoulder    Pain Orientation Right;Left    Pain Descriptors / Indicators Discomfort    Pain Type Chronic pain    Pain Onset More than a month ago    Pain Frequency Constant              [facility] PT Assessment - 12/05/20 0001      Assessment   Medical Diagnosis Chronic pain of both shoulders.    Referring Provider (PT) Aldean Baker MD.      Precautions   Precautions None      Restrictions   Weight Bearing Restrictions No                          [facility] Adult PT Treatment/Exercise - 12/05/20 0001      Shoulder Exercises: Pulleys   Flexion 5 minutes    Other Pulley Exercises seated UE ranger for cirles x41min each UE      Shoulder Exercises: ROM/Strengthening   Wall Wash x20 reps each UE    Other ROM/Strengthening Exercises BUE wall slides x10 reps      Shoulder Exercises: Stretch   Corner Stretch 5 reps;10 seconds    Table Stretch - ABduction Limitations BUE towel stretch 3x20 sec each      Modalities   Modalities Electrical Stimulation;Moist Heat      Moist Heat Therapy   Number Minutes Moist Heat 10 Minutes    Moist Heat Location Shoulder      Electrical Stimulation   Electrical Stimulation Location bil anterior shoulder.    Electrical Stimulation Action Pre-Mod    Electrical Stimulation Parameters 80-150 hz x10 min    Electrical Stimulation Goals Pain                       PT Long Term Goals - 11/30/20 1446      PT LONG TERM GOAL #1   Title Patient will be independent with HEP    Time 6    Period Weeks    Status On-going      PT LONG TERM GOAL #2   Title Bilateral active shoulder flexion to 145 degrees so the patient can easily reach overhead.    Time 6    Period Weeks    Status On-going      PT LONG TERM GOAL #3   Title Bilateral active shoulder ER to 70 degrees+ to allow for easily donning/doffing of apparel.    Time 6    Period Weeks    Status On-going      PT LONG TERM GOAL #4   Title Increase ROM so patient is able to reach behind back.    Time 6    Period Weeks    Status On-going      PT LONG TERM GOAL #5   Title Perform ADL's with pain not > 4/10.    Time 6    Period Weeks    Status On-going                 Plan - 12/05/20 1208    Clinical Impression Statement Patient presented in clinic with  continued reports of high level B shoulder pain. Patient continues to experience locking of L shoulder especially intermittantly with ADLs.  Patient limited with stretching today due to pain especialy R shoulder towel stretch. L shoulder observed at approximatley L3 region during L shoulder towel stretch. Normal modalities response noted following removal of the modalities.    Personal Factors and Comorbidities Comorbidity 1;Comorbidity 2;Other    Comorbidities Hypothyroidism, back surgery, bilateral TKA's, left THA, bilateral shoulder surgeries.    Examination-Activity Limitations Other;Reach Overhead    Examination-Participation Restrictions Other    Stability/Clinical Decision Making Evolving/Moderate complexity    Rehab Potential Fair    PT Frequency 2x / week    PT Duration 6 weeks    PT Treatment/Interventions ADLs/Self Care Home Management;Cryotherapy;Electrical Stimulation;Ultrasound;Moist Heat;Iontophoresis 4mg /ml Dexamethasone;Therapeutic activities;Therapeutic exercise;Patient/family education;Manual techniques;Passive range of motion;Dry needling    PT Next Visit Plan cont with POC for Pulleys, UE Ranger, PROM to bilateral shoulder, wall ladder.  PRE's.  Modalities and STW/M as needed.    Consulted and Agree with Plan of Care Patient           Patient will benefit from skilled therapeutic intervention in order to improve the following deficits and impairments:  Pain,Decreased activity tolerance,Postural dysfunction,Decreased range of motion,Decreased strength  Visit Diagnosis: Chronic right shoulder pain  Chronic left shoulder pain  Stiffness of left shoulder, not elsewhere classified  Stiffness of right shoulder, not elsewhere classified     Problem List Patient Active Problem List   Diagnosis Date Noted  . Leg pain, right 10/20/2019  . Dysesthesia 10/20/2019  . 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 Other spondylosis with radiculopathy, lumbar region   . Status post lumbar laminectomy 06/23/2019  . C. difficile colitis 05/22/2019  . Hypophosphatemia 05/21/2019  . Syncope 05/20/2019   . OSA on CPAP   . Acute gastroenteritis   . Anemia due to blood loss, acute 06/18/2018    Class: Acute  . Plantar fasciitis of left foot 06/16/2018    Class: Chronic  . Tendonitis, Achilles, right 06/16/2018    Class: Chronic  . Osteochondral talar dome lesion 06/16/2018    Class: Chronic  . Deep incisional surgical site infection   . Superficial incisional surgical site infection 06/11/2018    Class: Acute  . Right ankle effusion 06/11/2018    Class: Acute  . Morbid (severe) obesity due to excess calories (HCC) 05/29/2018    Class: Chronic  . Spondylolisthesis, lumbar region 05/27/2018    Class: Chronic  . Spinal stenosis of lumbar region 05/27/2018    Class: Chronic  . Urinary tract infection 05/27/2018    Class: Acute  . Fusion of spine of lumbar region 05/27/2018  . Pain of right hip joint 07/03/2017  . Acute right-sided low back pain with right-sided sciatica 03/27/2017  . Chronic right shoulder pain 02/13/2017  . History of rotator cuff tear 11/30/2016  . Impingement syndrome of right shoulder 11/30/2016  . Exacerbation of asthma 07/18/2016  . Hypokalemia 07/18/2016  . Hypertension 07/18/2016  . Depression with anxiety 07/18/2016  . Hypothyroidism 07/18/2016  . Hyperglycemia 07/18/2016  . Acute asthma exacerbation 07/18/2016  . Surgical wound dehiscence left hip; questionable superficial infection 11/10/2015  . Left hip postoperative wound infection 11/10/2015  . Osteoarthritis of left hip 09/09/2015  . 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 Obesity (BMI 35.0-39.9 without comorbidity) 04/28/2013    06/28/2013, PTA 12/05/2020, 12:20 PM  [facility] [address]  [address] [place], [place], [address] Phone: [phone]   Fax:  [phone]  Name: Donley Redderhelma M Pastrana MRN: [id number] Date of Birth: Jan 26, 1971

## 2020-12-09 ENCOUNTER — Other Ambulatory Visit: Payer: Self-pay

## 2020-12-09 ENCOUNTER — Ambulatory Visit: Payer: 59 | Admitting: Physical Therapy

## 2020-12-09 DIAGNOSIS — M25611 Stiffness of right shoulder, not elsewhere classified: Secondary | ICD-10-CM

## 2020-12-09 DIAGNOSIS — M25512 Pain in left shoulder: Secondary | ICD-10-CM

## 2020-12-09 DIAGNOSIS — G8929 Other chronic pain: Secondary | ICD-10-CM

## 2020-12-09 DIAGNOSIS — M25511 Pain in right shoulder: Secondary | ICD-10-CM

## 2020-12-09 DIAGNOSIS — M25612 Stiffness of left shoulder, not elsewhere classified: Secondary | ICD-10-CM

## 2020-12-09 NOTE — Therapy (Signed)
[facility] [address] [place], [place], [address] Phone: [phone]   Fax:  [phone]  Physical Therapy Treatment  Patient Details  Name: Marilyn [name] MRN: [id number] Date of Birth: March 09, 1971 Referring Provider (PT): Aldean BakerMarcus Duda MD.   Encounter Date: 12/09/2020   PT End of Session - 12/09/20 1126    Visit Number 4    Number of Visits 12    Date for PT Re-Evaluation 02/26/21    Authorization Type PROGRESS NOTE AT 10TH VISIT.  KX MODIFIER AFTER 15 VISITS.    PT Start Time 1120    PT Stop Time 1204    PT Time Calculation (min) 44 min    Activity Tolerance Patient tolerated treatment well    Behavior During Therapy WFL for tasks assessed/performed           Past Medical History:  Diagnosis Date  . Anemia    taking iron now. pt states having no current issues 09/02/2015  . Anginal pain (HCC)    pt states experiences chest wall pain pt states related to her asthma   . Anxiety    with MRI's  . Arthritis    Everywhere  . Asthma   . Depression   . Dizziness   . GERD (gastroesophageal reflux disease)   . Headache(784.0)    HX OF MIGRAINES  . History of bronchitis   . Hypertension   . Hypothyroidism    takes levothyroxen  . OSA on CPAP    wears cpap  . Shortness of breath    with exertion  . Wears glasses     Past Surgical History:  Procedure Laterality Date  . BACK SURGERY    . CESAREAN SECTION     times 2  . CHOLECYSTECTOMY    . ENDOMETRIAL ABLATION    . INCISION AND DRAINAGE HIP Left 11/10/2015   Procedure: IRRIGATION AND DEBRIDEMENT LEFT HIP INCISION;  Surgeon: Kathryne Hitchhristopher Y Blackman, MD;  Location: [facility] OR;  Service: Orthopedics;  Laterality: Left;  . JOINT REPLACEMENT  2011   total left knee  . KNEE ARTHROPLASTY  04/23/2012   right   . KNEE ARTHROSCOPY    . LUMBAR LAMINECTOMY/DECOMPRESSION MICRODISCECTOMY N/A 06/23/2019   Procedure: RIGHT LUMBAR FIVE THROUGH SACRAL ONE PARTIAL HEMILAMINECTOMY WITH  RIGHT LUMBAR FIVE FORAMINOTOMY;  Surgeon: Kerrin ChampagneNitka, James E, MD;  Location: [facility] OR;  Service: Orthopedics;  Laterality: N/A;  . LUMBAR WOUND DEBRIDEMENT N/A 06/11/2018   Procedure: LUMBAR WOUND DEBRIDEMENT;  Surgeon: Kerrin ChampagneNitka, James E, MD;  [facility];  Service: Orthopedics;  Laterality: N/A;  . RADIOLOGY WITH ANESTHESIA N/A 09/09/2018   Procedure: LUMBER SPINE WITHOUT CONTRAST;  Surgeon: Radiologist, Medication, MD;  Location: [facility] OR;  Service: Radiology;  Laterality: N/A;  . ROTATOR CUFF REPAIR     left   . SHOULDER SURGERY     right to repair ligament tear  . TOTAL HIP ARTHROPLASTY Left 09/09/2015   Procedure: LEFT TOTAL HIP ARTHROPLASTY ANTERIOR APPROACH;  Surgeon: Kathryne Hitchhristopher Y Blackman, MD;  Location: [facility];  Service: Orthopedics;  Laterality: Left;  . TOTAL KNEE ARTHROPLASTY  04/23/2012   Procedure: TOTAL KNEE ARTHROPLASTY;  Surgeon: Nadara MustardMarcus V Duda, MD;  Location: [facility] OR;  Service: Orthopedics;  Laterality: Right;  Right Total Knee Arthroplasty  . TUBAL LIGATION  1996    There were no vitals filed for this visit.   Subjective Assessment - 12/09/20 1127    Subjective COVID-19 screen performed prior to patient entering clinic.  Patient reports 6/10  bilaterally, more sharp in left than right.    Pertinent History Hypothyroidism, back surgery, bilateral TKA's, left THA, bilateral shoulder surgeries.    Diagnostic tests X-rays.    Patient Stated Goals Improve shoulder range of motion with decreased pain.    Currently in Pain? Yes    Pain Score 6     Pain Location Shoulder    Pain Orientation Right;Left    Pain Descriptors / Indicators Sharp    Pain Type Chronic pain    Pain Onset More than a month ago    Pain Frequency Constant              [facility] PT Assessment - 12/09/20 0001      Assessment   Medical Diagnosis Chronic pain of both shoulders.    Referring Provider (PT) Aldean Baker MD.      Precautions   Precautions None      Restrictions   Weight Bearing Restrictions No                          [facility] Adult PT Treatment/Exercise - 12/09/20 0001      Exercises   Exercises Shoulder      Shoulder Exercises: Pulleys   Flexion 5 minutes    Other Pulley Exercises seated UE ranger for cirles x4 min each UE      Shoulder Exercises: ROM/Strengthening   Other ROM/Strengthening Exercises BUE wall slides x10 reps    Other ROM/Strengthening Exercises Wall ladder x5 each      Shoulder Exercises: Stretch   Corner Stretch 5 reps;10 seconds      Modalities   Modalities Electrical Stimulation;Moist Heat      Moist Heat Therapy   Number Minutes Moist Heat 10 Minutes    Moist Heat Location Shoulder      Electrical Stimulation   Electrical Stimulation Location bilateral anterior shoulders    Electrical Stimulation Action pre-mod    Electrical Stimulation Parameters 80-150 hz x10 mins    Electrical Stimulation Goals Pain                       PT Long Term Goals - 11/30/20 1446      PT LONG TERM GOAL #1   Title Patient will be independent with HEP    Time 6    Period Weeks    Status On-going      PT LONG TERM GOAL #2   Title Bilateral active shoulder flexion to 145 degrees so the patient can easily reach overhead.    Time 6    Period Weeks    Status On-going      PT LONG TERM GOAL #3   Title Bilateral active shoulder ER to 70 degrees+ to allow for easily donning/doffing of apparel.    Time 6    Period Weeks    Status On-going      PT LONG TERM GOAL #4   Title Increase ROM so patient is able to reach behind back.    Time 6    Period Weeks    Status On-going      PT LONG TERM GOAL #5   Title Perform ADL's with pain not > 4/10.    Time 6    Period Weeks    Status On-going                 Plan - 12/09/20 1235    Clinical Impression Statement Patient arrived with  more pain due to coloring her hair last night. Patient was able to perform all exercises though with pain. Intermittent rest breaks provided for fatigue  and pain relief. No adverse affects upon removal.    Personal Factors and Comorbidities Comorbidity 1;Comorbidity 2;Other    Comorbidities Hypothyroidism, back surgery, bilateral TKA's, left THA, bilateral shoulder surgeries.    Examination-Activity Limitations Other;Reach Overhead    Examination-Participation Restrictions Other    Stability/Clinical Decision Making Evolving/Moderate complexity    Clinical Decision Making Low    Rehab Potential Fair    PT Frequency 2x / week    PT Duration 6 weeks    PT Treatment/Interventions ADLs/Self Care Home Management;Cryotherapy;Electrical Stimulation;Ultrasound;Moist Heat;Iontophoresis 4mg /ml Dexamethasone;Therapeutic activities;Therapeutic exercise;Patient/family education;Manual techniques;Passive range of motion;Dry needling    PT Next Visit Plan cont with POC for Pulleys, UE Ranger, PROM to bilateral shoulder, wall ladder.  PRE's.  Modalities and STW/M as needed.    Consulted and Agree with Plan of Care Patient           Patient will benefit from skilled therapeutic intervention in order to improve the following deficits and impairments:  Pain,Decreased activity tolerance,Postural dysfunction,Decreased range of motion,Decreased strength  Visit Diagnosis: Chronic right shoulder pain  Chronic left shoulder pain  Stiffness of left shoulder, not elsewhere classified  Stiffness of right shoulder, not elsewhere classified     Problem List Patient Active Problem List   Diagnosis Date Noted  . Leg pain, right 10/20/2019  . Dysesthesia 10/20/2019  . 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 Other spondylosis with radiculopathy, lumbar region   . Status post lumbar laminectomy 06/23/2019  . C. difficile colitis 05/22/2019  . Hypophosphatemia 05/21/2019  . Syncope 05/20/2019  . OSA on CPAP   . Acute gastroenteritis   . Anemia due to blood loss, acute 06/18/2018    Class: Acute  . Plantar fasciitis of left foot  06/16/2018    Class: Chronic  . Tendonitis, Achilles, right 06/16/2018    Class: Chronic  . Osteochondral talar dome lesion 06/16/2018    Class: Chronic  . Deep incisional surgical site infection   . Superficial incisional surgical site infection 06/11/2018    Class: Acute  . Right ankle effusion 06/11/2018    Class: Acute  . Morbid (severe) obesity due to excess calories (HCC) 05/29/2018    Class: Chronic  . Spondylolisthesis, lumbar region 05/27/2018    Class: Chronic  . Spinal stenosis of lumbar region 05/27/2018    Class: Chronic  . Urinary tract infection 05/27/2018    Class: Acute  . Fusion of spine of lumbar region 05/27/2018  . Pain of right hip joint 07/03/2017  . Acute right-sided low back pain with right-sided sciatica 03/27/2017  . Chronic right shoulder pain 02/13/2017  . History of rotator cuff tear 11/30/2016  . Impingement syndrome of right shoulder 11/30/2016  . Exacerbation of asthma 07/18/2016  . Hypokalemia 07/18/2016  . Hypertension 07/18/2016  . Depression with anxiety 07/18/2016  . Hypothyroidism 07/18/2016  . Hyperglycemia 07/18/2016  . Acute asthma exacerbation 07/18/2016  . Surgical wound dehiscence left hip; questionable superficial infection 11/10/2015  . Left hip postoperative wound infection 11/10/2015  . Osteoarthritis of left hip 09/09/2015  . 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 Obesity (BMI 35.0-39.9 without comorbidity) 04/28/2013    06/28/2013, PT, DPT 12/09/2020, 12:38 PM  [facility] [address] [place], [place], [place] Phone: [phone]   Fax:  [phone]  Name: Marilyn  AMIRIA [name] MRN: [id number] Date of Birth: [birth year]

## 2020-12-12 ENCOUNTER — Encounter: Payer: 59 | Admitting: Physical Therapy

## 2020-12-15 ENCOUNTER — Ambulatory Visit: Payer: 59 | Admitting: Orthopedic Surgery

## 2020-12-16 ENCOUNTER — Ambulatory Visit: Payer: 59 | Attending: Orthopedic Surgery | Admitting: Physical Therapy

## 2020-12-16 ENCOUNTER — Other Ambulatory Visit: Payer: Self-pay

## 2020-12-16 DIAGNOSIS — M25612 Stiffness of left shoulder, not elsewhere classified: Secondary | ICD-10-CM | POA: Diagnosis present

## 2020-12-16 DIAGNOSIS — R293 Abnormal posture: Secondary | ICD-10-CM | POA: Diagnosis present

## 2020-12-16 DIAGNOSIS — G8929 Other chronic pain: Secondary | ICD-10-CM | POA: Insufficient documentation

## 2020-12-16 DIAGNOSIS — M25611 Stiffness of right shoulder, not elsewhere classified: Secondary | ICD-10-CM | POA: Diagnosis present

## 2020-12-16 DIAGNOSIS — M25512 Pain in left shoulder: Secondary | ICD-10-CM | POA: Insufficient documentation

## 2020-12-16 DIAGNOSIS — M25511 Pain in right shoulder: Secondary | ICD-10-CM | POA: Diagnosis not present

## 2020-12-16 NOTE — Therapy (Signed)
[facility] [address] [place], [place], [address] Phone: [phone]   Fax:  [phone]  Physical Therapy Treatment  Patient Details  Name: Marilyn [name] MRN: [id number] Date of Birth: 1971/06/17 Referring Provider (PT): Aldean Baker MD.   Encounter Date: 12/16/2020   PT End of Session - 12/16/20 1045    Visit Number 5    Number of Visits 12    Date for PT Re-Evaluation 02/26/21    Authorization Type PROGRESS NOTE AT 10TH VISIT.  KX MODIFIER AFTER 15 VISITS.    PT Start Time 1040    PT Stop Time 1125    PT Time Calculation (min) 45 min    Activity Tolerance Patient tolerated treatment well    Behavior During Therapy WFL for tasks assessed/performed           Past Medical History:  Diagnosis Date  . Anemia    taking iron now. pt states having no current issues 09/02/2015  . Anginal pain (HCC)    pt states experiences chest wall pain pt states related to her asthma   . Anxiety    with MRI's  . Arthritis    Everywhere  . Asthma   . Depression   . Dizziness   . GERD (gastroesophageal reflux disease)   . Headache(784.0)    HX OF MIGRAINES  . History of bronchitis   . Hypertension   . Hypothyroidism    takes levothyroxen  . OSA on CPAP    wears cpap  . Shortness of breath    with exertion  . Wears glasses     Past Surgical History:  Procedure Laterality Date  . BACK SURGERY    . CESAREAN SECTION     times 2  . CHOLECYSTECTOMY    . ENDOMETRIAL ABLATION    . INCISION AND DRAINAGE HIP Left 11/10/2015   Procedure: IRRIGATION AND DEBRIDEMENT LEFT HIP INCISION;  Surgeon: Kathryne Hitch, MD;  Location: [facility];  Service: Orthopedics;  Laterality: Left;  . JOINT REPLACEMENT  2011   total left knee  . KNEE ARTHROPLASTY  04/23/2012   right   . KNEE ARTHROSCOPY    . LUMBAR LAMINECTOMY/DECOMPRESSION MICRODISCECTOMY N/A 06/23/2019   Procedure: RIGHT LUMBAR FIVE THROUGH SACRAL ONE PARTIAL HEMILAMINECTOMY WITH  RIGHT LUMBAR FIVE FORAMINOTOMY;  Surgeon: Kerrin Champagne, MD;  Location: [facility];  Service: Orthopedics;  Laterality: N/A;  . LUMBAR WOUND DEBRIDEMENT N/A 06/11/2018   Procedure: LUMBAR WOUND DEBRIDEMENT;  Surgeon: Kerrin Champagne, MD;  Location: [facility] OR;  Service: Orthopedics;  Laterality: N/A;  . RADIOLOGY WITH ANESTHESIA N/A 09/09/2018   Procedure: LUMBER SPINE WITHOUT CONTRAST;  Surgeon: Radiologist, Medication, MD;  Location: [facility];  Service: Radiology;  Laterality: N/A;  . ROTATOR CUFF REPAIR     left   . SHOULDER SURGERY     right to repair ligament tear  . TOTAL HIP ARTHROPLASTY Left 09/09/2015   Procedure: LEFT TOTAL HIP ARTHROPLASTY ANTERIOR APPROACH;  Surgeon: Kathryne Hitch, MD;  Location: [facility];  Service: Orthopedics;  Laterality: Left;  . TOTAL KNEE ARTHROPLASTY  04/23/2012   Procedure: TOTAL KNEE ARTHROPLASTY;  Surgeon: [name], MD;  Location: [facility] OR;  Service: Orthopedics;  Laterality: Right;  Right Total Knee Arthroplasty  . TUBAL LIGATION  1996    There were no vitals filed for this visit.   Subjective Assessment - 12/16/20 1051    Subjective COVID-19 screen performed prior to patient entering clinic.  Patient reports "so  so" right now.    Pertinent History Hypothyroidism, back surgery, bilateral TKA's, left THA, bilateral shoulder surgeries.    Diagnostic tests X-rays.    Patient Stated Goals Improve shoulder range of motion with decreased pain.    Currently in Pain? Yes    Pain Score 3     Pain Location Shoulder    Pain Orientation Right;Left    Pain Descriptors / Indicators Discomfort    Pain Type Chronic pain    Pain Onset More than a month ago    Pain Frequency Constant              [facility] PT Assessment - 12/16/20 0001      Assessment   Medical Diagnosis Chronic pain of both shoulders.    Referring Provider (PT) Aldean BakerMarcus Duda MD.      Precautions   Precautions None      Restrictions   Weight Bearing Restrictions No                          [facility] Adult PT Treatment/Exercise - 12/16/20 0001      Shoulder Exercises: Standing   Internal Rotation AAROM;Both;15 reps    Internal Rotation Limitations behind the bacl    Extension AAROM;Both;15 reps    Extension Limitations behind the back    Other Standing Exercises wall ladder x5 reps for each UE (R max 24, L max 23)      Shoulder Exercises: Pulleys   Flexion 5 minutes    Other Pulley Exercises seated UE ranger for flex, cirles x4 min each UE      Shoulder Exercises: Stretch   Corner Stretch 5 reps;10 seconds      Modalities   Modalities Electrical Stimulation;Moist Heat      Moist Heat Therapy   Number Minutes Moist Heat 15 Minutes    Moist Heat Location Shoulder      Electrical Stimulation   Electrical Stimulation Location B shoulder    Electrical Stimulation Action Pre-Mod    Electrical Stimulation Parameters 80-150 hz x15 min    Electrical Stimulation Goals Pain                       PT Long Term Goals - 11/30/20 1446      PT LONG TERM GOAL #1   Title Patient will be independent with HEP    Time 6    Period Weeks    Status On-going      PT LONG TERM GOAL #2   Title Bilateral active shoulder flexion to 145 degrees so the patient can easily reach overhead.    Time 6    Period Weeks    Status On-going      PT LONG TERM GOAL #3   Title Bilateral active shoulder ER to 70 degrees+ to allow for easily donning/doffing of apparel.    Time 6    Period Weeks    Status On-going      PT LONG TERM GOAL #4   Title Increase ROM so patient is able to reach behind back.    Time 6    Period Weeks    Status On-going      PT LONG TERM GOAL #5   Title Perform ADL's with pain not > 4/10.    Time 6    Period Weeks    Status On-going  Plan - 12/16/20 1219    Clinical Impression Statement Patient arrived with mid level B shoulder pain and progressed to more AAROM exercises for ROM and capsular  stretching. Light holds tolerated for stretching. End range flexion pain reported by patient with wall ladder for LUE. Patient able to reach for PVC pipe utilized for [facility] behind her back fairly well although greater challenge for LUE.Normal modalities response noted following removal of the modalities.    Personal Factors and Comorbidities Comorbidity 1;Comorbidity 2;Other    Comorbidities Hypothyroidism, back surgery, bilateral TKA's, left THA, bilateral shoulder surgeries.    Examination-Activity Limitations Other;Reach Overhead    Examination-Participation Restrictions Other    Stability/Clinical Decision Making Evolving/Moderate complexity    Rehab Potential Fair    PT Frequency 2x / week    PT Duration 6 weeks    PT Treatment/Interventions ADLs/Self Care Home Management;Cryotherapy;Electrical Stimulation;Ultrasound;Moist Heat;Iontophoresis 4mg /ml Dexamethasone;Therapeutic activities;Therapeutic exercise;Patient/family education;Manual techniques;Passive range of motion;Dry needling    PT Next Visit Plan cont with POC for Pulleys, UE Ranger, PROM to bilateral shoulder, wall ladder.  PRE's.  Modalities and STW/M as needed.    Consulted and Agree with Plan of Care Patient           Patient will benefit from skilled therapeutic intervention in order to improve the following deficits and impairments:  Pain,Decreased activity tolerance,Postural dysfunction,Decreased range of motion,Decreased strength  Visit Diagnosis: Chronic right shoulder pain  Chronic left shoulder pain  Stiffness of left shoulder, not elsewhere classified  Stiffness of right shoulder, not elsewhere classified  Abnormal posture     Problem List Patient Active Problem List   Diagnosis Date Noted  . Leg pain, right 10/20/2019  . Dysesthesia 10/20/2019  . 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 Other spondylosis with radiculopathy, lumbar region   . Status post lumbar laminectomy 06/23/2019   . C. difficile colitis 05/22/2019  . Hypophosphatemia 05/21/2019  . Syncope 05/20/2019  . OSA on CPAP   . Acute gastroenteritis   . Anemia due to blood loss, acute 06/18/2018    Class: Acute  . Plantar fasciitis of left foot 06/16/2018    Class: Chronic  . Tendonitis, Achilles, right 06/16/2018    Class: Chronic  . Osteochondral talar dome lesion 06/16/2018    Class: Chronic  . Deep incisional surgical site infection   . Superficial incisional surgical site infection 06/11/2018    Class: Acute  . Right ankle effusion 06/11/2018    Class: Acute  . Morbid (severe) obesity due to excess calories (HCC) 05/29/2018    Class: Chronic  . Spondylolisthesis, lumbar region 05/27/2018    Class: Chronic  . Spinal stenosis of lumbar region 05/27/2018    Class: Chronic  . Urinary tract infection 05/27/2018    Class: Acute  . Fusion of spine of lumbar region 05/27/2018  . Pain of right hip joint 07/03/2017  . Acute right-sided low back pain with right-sided sciatica 03/27/2017  . Chronic right shoulder pain 02/13/2017  . History of rotator cuff tear 11/30/2016  . Impingement syndrome of right shoulder 11/30/2016  . Exacerbation of asthma 07/18/2016  . Hypokalemia 07/18/2016  . Hypertension 07/18/2016  . Depression with anxiety 07/18/2016  . Hypothyroidism 07/18/2016  . Hyperglycemia 07/18/2016  . Acute asthma exacerbation 07/18/2016  . Surgical wound dehiscence left hip; questionable superficial infection 11/10/2015  . Left hip postoperative wound infection 11/10/2015  . Osteoarthritis of left hip 09/09/2015  . 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 Obesity (BMI  35.0-39.9 without comorbidity) 04/28/2013    Marvell Fuller, PTA 12/16/20 12:22 PM   [facility] [address] [place], [place], [address] Phone: [phone]   Fax:  [phone]  Name: ALLAYA ABBASI MRN: [id number] Date of Birth: 07-04-1971

## 2020-12-19 ENCOUNTER — Other Ambulatory Visit: Payer: Self-pay

## 2020-12-19 ENCOUNTER — Encounter: Payer: Self-pay | Admitting: Orthopedic Surgery

## 2020-12-19 ENCOUNTER — Ambulatory Visit (INDEPENDENT_AMBULATORY_CARE_PROVIDER_SITE_OTHER): Payer: 59 | Admitting: Orthopedic Surgery

## 2020-12-19 VITALS — Ht 63.0 in | Wt 360.0 lb

## 2020-12-19 DIAGNOSIS — M25512 Pain in left shoulder: Secondary | ICD-10-CM | POA: Diagnosis not present

## 2020-12-19 DIAGNOSIS — M25511 Pain in right shoulder: Secondary | ICD-10-CM | POA: Diagnosis not present

## 2020-12-19 DIAGNOSIS — G8929 Other chronic pain: Secondary | ICD-10-CM

## 2020-12-21 ENCOUNTER — Ambulatory Visit: Payer: 59 | Admitting: Physical Therapy

## 2020-12-21 ENCOUNTER — Other Ambulatory Visit: Payer: Self-pay

## 2020-12-21 ENCOUNTER — Encounter: Payer: Self-pay | Admitting: Physical Therapy

## 2020-12-21 DIAGNOSIS — M25612 Stiffness of left shoulder, not elsewhere classified: Secondary | ICD-10-CM

## 2020-12-21 DIAGNOSIS — M25511 Pain in right shoulder: Secondary | ICD-10-CM | POA: Diagnosis not present

## 2020-12-21 DIAGNOSIS — M25512 Pain in left shoulder: Secondary | ICD-10-CM

## 2020-12-21 DIAGNOSIS — M25611 Stiffness of right shoulder, not elsewhere classified: Secondary | ICD-10-CM

## 2020-12-21 DIAGNOSIS — G8929 Other chronic pain: Secondary | ICD-10-CM

## 2020-12-21 NOTE — Therapy (Signed)
[facility] [address] [place], [place], [address] Phone: [phone]   Fax:  [phone]  Physical Therapy Treatment  Patient Details  Name: Marilyn [name] MRN: [id number] Date of Birth: [birth year] Referring Provider (PT): Aldean Baker MD.   Encounter Date: 12/21/2020   PT End of Session - 12/21/20 1320    Visit Number 6    Number of Visits 12    Date for PT Re-Evaluation 02/26/21    Authorization Type PROGRESS NOTE AT 10TH VISIT.  KX MODIFIER AFTER 15 VISITS.    PT Start Time 1316    PT Stop Time 1358    PT Time Calculation (min) 42 min    Activity Tolerance Patient tolerated treatment well    Behavior During Therapy WFL for tasks assessed/performed           Past Medical History:  Diagnosis Date  . Anemia    taking iron now. pt states having no current issues 09/02/2015  . Anginal pain (HCC)    pt states experiences chest wall pain pt states related to her asthma   . Anxiety    with MRI's  . Arthritis    Everywhere  . Asthma   . Depression   . Dizziness   . GERD (gastroesophageal reflux disease)   . Headache(784.0)    HX OF MIGRAINES  . History of bronchitis   . Hypertension   . Hypothyroidism    takes levothyroxen  . OSA on CPAP    wears cpap  . Shortness of breath    with exertion  . Wears glasses     Past Surgical History:  Procedure Laterality Date  . BACK SURGERY    . CESAREAN SECTION     times 2  . CHOLECYSTECTOMY    . ENDOMETRIAL ABLATION    . INCISION AND DRAINAGE HIP Left 11/10/2015   Procedure: IRRIGATION AND DEBRIDEMENT LEFT HIP INCISION;  Surgeon: Kathryne Hitch, MD;  [facility]: [facility];  Service: Orthopedics;  Laterality: Left;  . JOINT REPLACEMENT  2011   total left knee  . KNEE ARTHROPLASTY  04/23/2012   right   . KNEE ARTHROSCOPY    . LUMBAR LAMINECTOMY/DECOMPRESSION MICRODISCECTOMY N/A 06/23/2019   Procedure: RIGHT LUMBAR FIVE THROUGH SACRAL ONE PARTIAL HEMILAMINECTOMY WITH  RIGHT LUMBAR FIVE FORAMINOTOMY;  Surgeon: Kerrin Champagne, MD;  Location: [facility];  Service: Orthopedics;  Laterality: N/A;  . LUMBAR WOUND DEBRIDEMENT N/A 06/11/2018   Procedure: LUMBAR WOUND DEBRIDEMENT;  Surgeon: Kerrin Champagne, MD;  Location: [facility] OR;  Service: Orthopedics;  Laterality: N/A;  . RADIOLOGY WITH ANESTHESIA N/A 09/09/2018   Procedure: LUMBER SPINE WITHOUT CONTRAST;  Surgeon: Radiologist, Medication, MD;  Location: [facility];  Service: Radiology;  Laterality: N/A;  . ROTATOR CUFF REPAIR     left   . SHOULDER SURGERY     right to repair ligament tear  . TOTAL HIP ARTHROPLASTY Left 09/09/2015   Procedure: LEFT TOTAL HIP ARTHROPLASTY ANTERIOR APPROACH;  Surgeon: Kathryne Hitch, MD;  Location: [facility];  Service: Orthopedics;  Laterality: Left;  . TOTAL KNEE ARTHROPLASTY  04/23/2012   Procedure: TOTAL KNEE ARTHROPLASTY;  Surgeon: [name], MD;  Location: [facility];  Service: Orthopedics;  Laterality: Right;  Right Total Knee Arthroplasty  . TUBAL LIGATION  1996    There were no vitals filed for this visit.   Subjective Assessment - 12/21/20 1323    Subjective COVID-19 screen performed prior to patient entering clinic. Patient arrives stating more  L shoulder pain and R shoulder weakness. MD states there isn't much they can do for her until total shoulder replacement.    Pertinent History Hypothyroidism, back surgery, bilateral TKA's, left THA, bilateral shoulder surgeries.    Diagnostic tests X-rays.    Patient Stated Goals Improve shoulder range of motion with decreased pain.    Currently in Pain? Yes   did not provide number on pain scale   Pain Score 7    right shoulder: low 3-4/10   Pain Location Shoulder    Pain Orientation Right;Left    Pain Descriptors / Indicators Discomfort    Pain Onset More than a month ago    Pain Frequency Constant              [facility] PT Assessment - 12/21/20 0001      Assessment   Medical Diagnosis Chronic pain of both shoulders.     Referring Provider (PT) Aldean Baker MD.    Next MD Visit n/a      Precautions   Precautions None                         [facility] Adult PT Treatment/Exercise - 12/21/20 0001      Exercises   Exercises Shoulder      Shoulder Exercises: Standing   Internal Rotation AAROM;Both;15 reps    Internal Rotation Limitations behind the bacl    Extension AAROM;Both;15 reps    Extension Limitations behind the back      Shoulder Exercises: Pulleys   Flexion 5 minutes      Shoulder Exercises: Isometric Strengthening   Flexion 5X5"   bilateral   Extension 5X5"   bilateral   External Rotation 5X5"   bilateral   Internal Rotation 5X5"   bilateral   ABduction 5X5"   bilateral   ADduction 5X5"   bilateral     Modalities   Modalities Electrical Stimulation;Moist Heat      Moist Heat Therapy   Number Minutes Moist Heat 15 Minutes    Moist Heat Location Shoulder      Electrical Stimulation   Electrical Stimulation Location B shoulder    Electrical Stimulation Action pre-mod    Electrical Stimulation Parameters 80-150 hz x15 mins    Electrical Stimulation Goals Pain                       PT Long Term Goals - 11/30/20 1446      PT LONG TERM GOAL #1   Title Patient will be independent with HEP    Time 6    Period Weeks    Status On-going      PT LONG TERM GOAL #2   Title Bilateral active shoulder flexion to 145 degrees so the patient can easily reach overhead.    Time 6    Period Weeks    Status On-going      PT LONG TERM GOAL #3   Title Bilateral active shoulder ER to 70 degrees+ to allow for easily donning/doffing of apparel.    Time 6    Period Weeks    Status On-going      PT LONG TERM GOAL #4   Title Increase ROM so patient is able to reach behind back.    Time 6    Period Weeks    Status On-going      PT LONG TERM GOAL #5   Title Perform ADL's with pain not >  4/10.    Time 6    Period Weeks    Status On-going                  Plan - 12/21/20 1335    Clinical Impression Statement Patient arrives with ongoing bilateral shoulder pain L>R. Patient guided though isometrics and AAROM with good response. Patient provided with HEP of isometrics with reports of understanding. Patient educated on building strength and maintaining gains    Personal Factors and Comorbidities Comorbidity 1;Comorbidity 2;Other    Comorbidities Hypothyroidism, back surgery, bilateral TKA's, left THA, bilateral shoulder surgeries.    Examination-Activity Limitations Other;Reach Overhead    Examination-Participation Restrictions Other    Stability/Clinical Decision Making Evolving/Moderate complexity    Clinical Decision Making Low    Rehab Potential Fair    PT Frequency 2x / week    PT Duration 6 weeks    PT Treatment/Interventions ADLs/Self Care Home Management;Cryotherapy;Electrical Stimulation;Ultrasound;Moist Heat;Iontophoresis 4mg /ml Dexamethasone;Therapeutic activities;Therapeutic exercise;Patient/family education;Manual techniques;Passive range of motion;Dry needling    PT Next Visit Plan cont with POC for Pulleys, UE Ranger, PROM to bilateral shoulder, wall ladder.  PRE's.  Modalities and STW/M as needed.    Consulted and Agree with Plan of Care Patient           Patient will benefit from skilled therapeutic intervention in order to improve the following deficits and impairments:  Pain,Decreased activity tolerance,Postural dysfunction,Decreased range of motion,Decreased strength  Visit Diagnosis: Chronic right shoulder pain  Chronic left shoulder pain  Stiffness of left shoulder, not elsewhere classified  Stiffness of right shoulder, not elsewhere classified     Problem List Patient Active Problem List   Diagnosis Date Noted  . Leg pain, right 10/20/2019  . Dysesthesia 10/20/2019  . 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 Other spondylosis with radiculopathy, lumbar region   . Status post lumbar  laminectomy 06/23/2019  . C. difficile colitis 05/22/2019  . Hypophosphatemia 05/21/2019  . Syncope 05/20/2019  . OSA on CPAP   . Acute gastroenteritis   . Anemia due to blood loss, acute 06/18/2018    Class: Acute  . Plantar fasciitis of left foot 06/16/2018    Class: Chronic  . Tendonitis, Achilles, right 06/16/2018    Class: Chronic  . Osteochondral talar dome lesion 06/16/2018    Class: Chronic  . Deep incisional surgical site infection   . Superficial incisional surgical site infection 06/11/2018    Class: Acute  . Right ankle effusion 06/11/2018    Class: Acute  . Morbid (severe) obesity due to excess calories (HCC) 05/29/2018    Class: Chronic  . Spondylolisthesis, lumbar region 05/27/2018    Class: Chronic  . Spinal stenosis of lumbar region 05/27/2018    Class: Chronic  . Urinary tract infection 05/27/2018    Class: Acute  . Fusion of spine of lumbar region 05/27/2018  . Pain of right hip joint 07/03/2017  . Acute right-sided low back pain with right-sided sciatica 03/27/2017  . Chronic right shoulder pain 02/13/2017  . History of rotator cuff tear 11/30/2016  . Impingement syndrome of right shoulder 11/30/2016  . Exacerbation of asthma 07/18/2016  . Hypokalemia 07/18/2016  . Hypertension 07/18/2016  . Depression with anxiety 07/18/2016  . Hypothyroidism 07/18/2016  . Hyperglycemia 07/18/2016  . Acute asthma exacerbation 07/18/2016  . Surgical wound dehiscence left hip; questionable superficial infection 11/10/2015  . Left hip postoperative wound infection 11/10/2015  . Osteoarthritis of left hip 09/09/2015  . Status post total replacement of  left hip 09/09/2015  . Obesity (BMI 35.0-39.9 without comorbidity) 04/28/2013    Guss Bunde, PT, DPT 12/21/2020, 2:22 PM  [facility] [address] [place], [place], [address] Phone: [phone]   Fax:  [phone]  Name: Marilyn [name] MRN: [id number] Date of  Birth: [birth year]

## 2020-12-21 NOTE — Therapy (Signed)
[facility] [address] [place], [place], [address] Phone: [phone]   Fax:  [phone]  Physical Therapy Treatment  Patient Details  Name: Marilyn [name] MRN: [id number] Date of Birth: [birth year] Referring Provider (PT): Aldean Baker MD.   Encounter Date: 12/21/2020   PT End of Session - 12/21/20 1320    Visit Number 6    Number of Visits 12    Date for PT Re-Evaluation 02/26/21    Authorization Type PROGRESS NOTE AT 10TH VISIT.  KX MODIFIER AFTER 15 VISITS.    PT Start Time 1316    PT Stop Time 1358    PT Time Calculation (min) 42 min    Activity Tolerance Patient tolerated treatment well    Behavior During Therapy WFL for tasks assessed/performed           Past Medical History:  Diagnosis Date  . Anemia    taking iron now. pt states having no current issues 09/02/2015  . Anginal pain (HCC)    pt states experiences chest wall pain pt states related to her asthma   . Anxiety    with MRI's  . Arthritis    Everywhere  . Asthma   . Depression   . Dizziness   . GERD (gastroesophageal reflux disease)   . Headache(784.0)    HX OF MIGRAINES  . History of bronchitis   . Hypertension   . Hypothyroidism    takes levothyroxen  . OSA on CPAP    wears cpap  . Shortness of breath    with exertion  . Wears glasses     Past Surgical History:  Procedure Laterality Date  . BACK SURGERY    . CESAREAN SECTION     times 2  . CHOLECYSTECTOMY    . ENDOMETRIAL ABLATION    . INCISION AND DRAINAGE HIP Left 11/10/2015   Procedure: IRRIGATION AND DEBRIDEMENT LEFT HIP INCISION;  Surgeon: Kathryne Hitch, MD;  Location: [facility];  Service: Orthopedics;  Laterality: Left;  . JOINT REPLACEMENT  2011   total left knee  . KNEE ARTHROPLASTY  04/23/2012   right   . KNEE ARTHROSCOPY    . LUMBAR LAMINECTOMY/DECOMPRESSION MICRODISCECTOMY N/A 06/23/2019   Procedure: RIGHT LUMBAR FIVE THROUGH SACRAL ONE PARTIAL HEMILAMINECTOMY WITH  RIGHT LUMBAR FIVE FORAMINOTOMY;  Surgeon: Kerrin Champagne, MD;  Location: [facility];  Service: Orthopedics;  Laterality: N/A;  . LUMBAR WOUND DEBRIDEMENT N/A 06/11/2018   Procedure: LUMBAR WOUND DEBRIDEMENT;  Surgeon: Kerrin Champagne, MD;  Location: [facility] OR;  Service: Orthopedics;  Laterality: N/A;  . RADIOLOGY WITH ANESTHESIA N/A 09/09/2018   Procedure: LUMBER SPINE WITHOUT CONTRAST;  Surgeon: Radiologist, Medication, MD;  Location: [facility];  Service: Radiology;  Laterality: N/A;  . ROTATOR CUFF REPAIR     left   . SHOULDER SURGERY     right to repair ligament tear  . TOTAL HIP ARTHROPLASTY Left 09/09/2015   Procedure: LEFT TOTAL HIP ARTHROPLASTY ANTERIOR APPROACH;  Surgeon: Kathryne Hitch, MD;  Location: [facility];  Service: Orthopedics;  Laterality: Left;  . TOTAL KNEE ARTHROPLASTY  04/23/2012   Procedure: TOTAL KNEE ARTHROPLASTY;  Surgeon: [name], MD;  Location: [facility];  Service: Orthopedics;  Laterality: Right;  Right Total Knee Arthroplasty  . TUBAL LIGATION  1996    There were no vitals filed for this visit.   Subjective Assessment - 12/21/20 1323    Subjective COVID-19 screen performed prior to patient entering clinic. Patient arrives stating more  L shoulder pain and R shoulder weakness. MD states there isn't much they can do for her until total shoulder replacement.    Pertinent History Hypothyroidism, back surgery, bilateral TKA's, left THA, bilateral shoulder surgeries.    Diagnostic tests X-rays.    Patient Stated Goals Improve shoulder range of motion with decreased pain.    Currently in Pain? Yes   did not provide number on pain scale   Pain Score 7    right shoulder: low 3-4/10   Pain Location Shoulder    Pain Orientation Right;Left    Pain Descriptors / Indicators Discomfort    Pain Onset More than a month ago    Pain Frequency Constant              [facility] PT Assessment - 12/21/20 0001      Assessment   Medical Diagnosis Chronic pain of both shoulders.     Referring Provider (PT) Aldean Baker MD.    Next MD Visit n/a      Precautions   Precautions None                         [facility] Adult PT Treatment/Exercise - 12/21/20 0001      Exercises   Exercises Shoulder      Shoulder Exercises: Standing   Internal Rotation AAROM;Both;15 reps    Internal Rotation Limitations behind the bacl    Extension AAROM;Both;15 reps    Extension Limitations behind the back      Shoulder Exercises: Pulleys   Flexion 5 minutes      Shoulder Exercises: Isometric Strengthening   Flexion 5X5"   bilateral   Extension 5X5"   bilateral   External Rotation 5X5"   bilateral   Internal Rotation 5X5"   bilateral   ABduction 5X5"   bilateral   ADduction 5X5"   bilateral     Modalities   Modalities Electrical Stimulation;Moist Heat      Moist Heat Therapy   Number Minutes Moist Heat 15 Minutes    Moist Heat Location Shoulder      Electrical Stimulation   Electrical Stimulation Location B shoulder    Electrical Stimulation Action pre-mod    Electrical Stimulation Parameters 80-150 hz x15 mins    Electrical Stimulation Goals Pain                       PT Long Term Goals - 11/30/20 1446      PT LONG TERM GOAL #1   Title Patient will be independent with HEP    Time 6    Period Weeks    Status On-going      PT LONG TERM GOAL #2   Title Bilateral active shoulder flexion to 145 degrees so the patient can easily reach overhead.    Time 6    Period Weeks    Status On-going      PT LONG TERM GOAL #3   Title Bilateral active shoulder ER to 70 degrees+ to allow for easily donning/doffing of apparel.    Time 6    Period Weeks    Status On-going      PT LONG TERM GOAL #4   Title Increase ROM so patient is able to reach behind back.    Time 6    Period Weeks    Status On-going      PT LONG TERM GOAL #5   Title Perform ADL's with pain not >  4/10.    Time 6    Period Weeks    Status On-going                  Plan - 12/21/20 1335    Clinical Impression Statement Patient arrives with ongoing bilateral shoulder pain L>R. Patient guided though isometrics and AAROM with good response though with slight shoulder discomfort L>R. Patient provided with HEP of isometrics with reports of understanding. Patient educated on building strength and maintaining gains after PT DC. Patient reported understanding.    Personal Factors and Comorbidities Comorbidity 1;Comorbidity 2;Other    Comorbidities Hypothyroidism, back surgery, bilateral TKA's, left THA, bilateral shoulder surgeries.    Examination-Activity Limitations Other;Reach Overhead    Examination-Participation Restrictions Other    Stability/Clinical Decision Making Evolving/Moderate complexity    Clinical Decision Making Low    Rehab Potential Fair    PT Frequency 2x / week    PT Duration 6 weeks    PT Treatment/Interventions ADLs/Self Care Home Management;Cryotherapy;Electrical Stimulation;Ultrasound;Moist Heat;Iontophoresis 4mg /ml Dexamethasone;Therapeutic activities;Therapeutic exercise;Patient/family education;Manual techniques;Passive range of motion;Dry needling    PT Next Visit Plan cont with POC for Pulleys, UE Ranger, PROM to bilateral shoulder, wall ladder.  PRE's.  Modalities and STW/M as needed.    Consulted and Agree with Plan of Care Patient           Patient will benefit from skilled therapeutic intervention in order to improve the following deficits and impairments:  Pain,Decreased activity tolerance,Postural dysfunction,Decreased range of motion,Decreased strength  Visit Diagnosis: Chronic right shoulder pain  Chronic left shoulder pain  Stiffness of left shoulder, not elsewhere classified  Stiffness of right shoulder, not elsewhere classified     Problem List Patient Active Problem List   Diagnosis Date Noted  . Leg pain, right 10/20/2019  . Dysesthesia 10/20/2019  . Lateral femoral cutaneous neuropathy, right 10/20/2019   . Other spondylosis with radiculopathy, lumbar region   . Status post lumbar laminectomy 06/23/2019  . C. difficile colitis 05/22/2019  . Hypophosphatemia 05/21/2019  . Syncope 05/20/2019  . OSA on CPAP   . Acute gastroenteritis   . Anemia due to blood loss, acute 06/18/2018    Class: Acute  . Plantar fasciitis of left foot 06/16/2018    Class: Chronic  . Tendonitis, Achilles, right 06/16/2018    Class: Chronic  . Osteochondral talar dome lesion 06/16/2018    Class: Chronic  . Deep incisional surgical site infection   . Superficial incisional surgical site infection 06/11/2018    Class: Acute  . Right ankle effusion 06/11/2018    Class: Acute  . Morbid (severe) obesity due to excess calories (HCC) 05/29/2018    Class: Chronic  . Spondylolisthesis, lumbar region 05/27/2018    Class: Chronic  . Spinal stenosis of lumbar region 05/27/2018    Class: Chronic  . Urinary tract infection 05/27/2018    Class: Acute  . Fusion of spine of lumbar region 05/27/2018  . Pain of right hip joint 07/03/2017  . Acute right-sided low back pain with right-sided sciatica 03/27/2017  . Chronic right shoulder pain 02/13/2017  . History of rotator cuff tear 11/30/2016  . Impingement syndrome of right shoulder 11/30/2016  . Exacerbation of asthma 07/18/2016  . Hypokalemia 07/18/2016  . Hypertension 07/18/2016  . Depression with anxiety 07/18/2016  . Hypothyroidism 07/18/2016  . Hyperglycemia 07/18/2016  . Acute asthma exacerbation 07/18/2016  . Surgical wound dehiscence left hip; questionable superficial infection 11/10/2015  . Left hip postoperative wound infection 11/10/2015  .  Osteoarthritis of left hip 09/09/2015  . 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 Obesity (BMI 35.0-39.9 without comorbidity) 04/28/2013    Guss Bunde, PT, DPT 12/21/2020, 2:24 PM  [facility] [address] [place], [place], [address] Phone:  [phone]   Fax:  [phone]  Name: Marilyn [name] MRN: [id number] Date of Birth: 1970/10/22

## 2020-12-23 ENCOUNTER — Ambulatory Visit: Payer: 59 | Admitting: Physical Therapy

## 2020-12-23 ENCOUNTER — Other Ambulatory Visit: Payer: Self-pay

## 2020-12-23 DIAGNOSIS — M25511 Pain in right shoulder: Secondary | ICD-10-CM | POA: Diagnosis not present

## 2020-12-23 DIAGNOSIS — M25612 Stiffness of left shoulder, not elsewhere classified: Secondary | ICD-10-CM

## 2020-12-23 DIAGNOSIS — G8929 Other chronic pain: Secondary | ICD-10-CM

## 2020-12-23 DIAGNOSIS — M25512 Pain in left shoulder: Secondary | ICD-10-CM

## 2020-12-23 DIAGNOSIS — M25611 Stiffness of right shoulder, not elsewhere classified: Secondary | ICD-10-CM

## 2020-12-23 NOTE — Therapy (Signed)
[facility] [address] [place], [place], [address] Phone: [phone]   Fax:  [phone]  Physical Therapy Treatment  Patient Details  Name: Marilyn [name] MRN: [id number] Date of Birth: 04/13/1971 Referring Provider (PT): Aldean Baker MD.   Encounter Date: 12/23/2020   PT End of Session - 12/23/20 1100    Visit Number 7    Number of Visits 12    Date for PT Re-Evaluation 02/26/21    Authorization Type PROGRESS NOTE AT 10TH VISIT.  KX MODIFIER AFTER 15 VISITS.    PT Start Time 1038   late arrival   PT Stop Time 1119    PT Time Calculation (min) 41 min    Activity Tolerance Patient tolerated treatment well    Behavior During Therapy WFL for tasks assessed/performed           Past Medical History:  Diagnosis Date  . Anemia    taking iron now. pt states having no current issues 09/02/2015  . Anginal pain (HCC)    pt states experiences chest wall pain pt states related to her asthma   . Anxiety    with MRI's  . Arthritis    Everywhere  . Asthma   . Depression   . Dizziness   . GERD (gastroesophageal reflux disease)   . Headache(784.0)    HX OF MIGRAINES  . History of bronchitis   . Hypertension   . Hypothyroidism    takes levothyroxen  . OSA on CPAP    wears cpap  . Shortness of breath    with exertion  . Wears glasses     Past Surgical History:  Procedure Laterality Date  . BACK SURGERY    . CESAREAN SECTION     times 2  . CHOLECYSTECTOMY    . ENDOMETRIAL ABLATION    . INCISION AND DRAINAGE HIP Left 11/10/2015   Procedure: IRRIGATION AND DEBRIDEMENT LEFT HIP INCISION;  Surgeon: Kathryne Hitch, MD;  Location: [facility] OR;  Service: Orthopedics;  Laterality: Left;  . JOINT REPLACEMENT  2011   total left knee  . KNEE ARTHROPLASTY  04/23/2012   right   . KNEE ARTHROSCOPY    . LUMBAR LAMINECTOMY/DECOMPRESSION MICRODISCECTOMY N/A 06/23/2019   Procedure: RIGHT LUMBAR FIVE THROUGH SACRAL ONE PARTIAL  HEMILAMINECTOMY WITH RIGHT LUMBAR FIVE FORAMINOTOMY;  Surgeon: Kerrin Champagne, MD;  Location: [facility] OR;  Service: Orthopedics;  Laterality: N/A;  . LUMBAR WOUND DEBRIDEMENT N/A 06/11/2018   Procedure: LUMBAR WOUND DEBRIDEMENT;  Surgeon: Kerrin Champagne, MD;  Location: [facility] OR;  Service: Orthopedics;  Laterality: N/A;  . RADIOLOGY WITH ANESTHESIA N/A 09/09/2018   Procedure: LUMBER SPINE WITHOUT CONTRAST;  Surgeon: Radiologist, Medication, MD;  Location: [facility] OR;  Service: Radiology;  Laterality: N/A;  . ROTATOR CUFF REPAIR     left   . SHOULDER SURGERY     right to repair ligament tear  . TOTAL HIP ARTHROPLASTY Left 09/09/2015   Procedure: LEFT TOTAL HIP ARTHROPLASTY ANTERIOR APPROACH;  Surgeon: Kathryne Hitch, MD;  Location: [facility];  Service: Orthopedics;  Laterality: Left;  . TOTAL KNEE ARTHROPLASTY  04/23/2012   Procedure: TOTAL KNEE ARTHROPLASTY;  Surgeon: [name], MD;  Location: [facility] OR;  Service: Orthopedics;  Laterality: Right;  Right Total Knee Arthroplasty  . TUBAL LIGATION  1996    There were no vitals filed for this visit.       [facility] PT Assessment - 12/23/20 0001      Assessment  Medical Diagnosis Chronic pain of both shoulders.    Referring Provider (PT) Aldean Baker MD.    Next MD Visit n/a      Precautions   Precautions None                         [facility] Adult PT Treatment/Exercise - 12/23/20 0001      Exercises   Exercises Shoulder      Shoulder Exercises: Standing   Internal Rotation AAROM;Both;15 reps    Internal Rotation Limitations behind the bacl    Extension AAROM;Both;15 reps    Extension Limitations behind the back      Shoulder Exercises: Pulleys   Flexion 5 minutes    Other Pulley Exercises seated UE ranger for flex, cirles x6 min each UE      Modalities   Modalities Electrical Stimulation;Moist Heat      Moist Heat Therapy   Number Minutes Moist Heat 15 Minutes    Moist Heat Location Shoulder      Electrical Stimulation    Electrical Stimulation Location B shoulder    Electrical Stimulation Action pre-mod 2 channels    Electrical Stimulation Parameters 80-150 hz x.15 mins    Electrical Stimulation Goals Pain                       PT Long Term Goals - 11/30/20 1446      PT LONG TERM GOAL #1   Title Patient will be independent with HEP    Time 6    Period Weeks    Status On-going      PT LONG TERM GOAL #2   Title Bilateral active shoulder flexion to 145 degrees so the patient can easily reach overhead.    Time 6    Period Weeks    Status On-going      PT LONG TERM GOAL #3   Title Bilateral active shoulder ER to 70 degrees+ to allow for easily donning/doffing of apparel.    Time 6    Period Weeks    Status On-going      PT LONG TERM GOAL #4   Title Increase ROM so patient is able to reach behind back.    Time 6    Period Weeks    Status On-going      PT LONG TERM GOAL #5   Title Perform ADL's with pain not > 4/10.    Time 6    Period Weeks    Status On-going                 Plan - 12/23/20 1124    Clinical Impression Statement Patient arrived with more bilateral shoulder pain today. Patient guided through TEs but with frequent rests secondary to pain. Patient reported pain being more deep within the joint rather than superficially. Patient with no adverse affects upon removal of modalities. Patient reported having a TENs unit at home that she would like to utilize for pain relief.    Personal Factors and Comorbidities Comorbidity 1;Comorbidity 2;Other    Comorbidities Hypothyroidism, back surgery, bilateral TKA's, left THA, bilateral shoulder surgeries.    Examination-Activity Limitations Other;Reach Overhead    Examination-Participation Restrictions Other    Stability/Clinical Decision Making Evolving/Moderate complexity    Clinical Decision Making Low    Rehab Potential Fair    PT Frequency 2x / week    PT Duration 6 weeks    PT Treatment/Interventions ADLs/Self  Care  Home Management;Cryotherapy;Electrical Stimulation;Ultrasound;Moist Heat;Iontophoresis 4mg /ml Dexamethasone;Therapeutic activities;Therapeutic exercise;Patient/family education;Manual techniques;Passive range of motion;Dry needling    PT Next Visit Plan cont with POC for Pulleys, UE Ranger, PROM to bilateral shoulder, wall ladder.  PRE's.  Modalities and STW/M as needed.    Consulted and Agree with Plan of Care Patient           Patient will benefit from skilled therapeutic intervention in order to improve the following deficits and impairments:  Pain,Decreased activity tolerance,Postural dysfunction,Decreased range of motion,Decreased strength  Visit Diagnosis: Chronic right shoulder pain  Chronic left shoulder pain  Stiffness of left shoulder, not elsewhere classified  Stiffness of right shoulder, not elsewhere classified     Problem List Patient Active Problem List   Diagnosis Date Noted  . Leg pain, right 10/20/2019  . Dysesthesia 10/20/2019  . 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 Other spondylosis with radiculopathy, lumbar region   . Status post lumbar laminectomy 06/23/2019  . C. difficile colitis 05/22/2019  . Hypophosphatemia 05/21/2019  . Syncope 05/20/2019  . OSA on CPAP   . Acute gastroenteritis   . Anemia due to blood loss, acute 06/18/2018    Class: Acute  . Plantar fasciitis of left foot 06/16/2018    Class: Chronic  . Tendonitis, Achilles, right 06/16/2018    Class: Chronic  . Osteochondral talar dome lesion 06/16/2018    Class: Chronic  . Deep incisional surgical site infection   . Superficial incisional surgical site infection 06/11/2018    Class: Acute  . Right ankle effusion 06/11/2018    Class: Acute  . Morbid (severe) obesity due to excess calories (HCC) 05/29/2018    Class: Chronic  . Spondylolisthesis, lumbar region 05/27/2018    Class: Chronic  . Spinal stenosis of lumbar region 05/27/2018    Class: Chronic  .  Urinary tract infection 05/27/2018    Class: Acute  . Fusion of spine of lumbar region 05/27/2018  . Pain of right hip joint 07/03/2017  . Acute right-sided low back pain with right-sided sciatica 03/27/2017  . Chronic right shoulder pain 02/13/2017  . History of rotator cuff tear 11/30/2016  . Impingement syndrome of right shoulder 11/30/2016  . Exacerbation of asthma 07/18/2016  . Hypokalemia 07/18/2016  . Hypertension 07/18/2016  . Depression with anxiety 07/18/2016  . Hypothyroidism 07/18/2016  . Hyperglycemia 07/18/2016  . Acute asthma exacerbation 07/18/2016  . Surgical wound dehiscence left hip; questionable superficial infection 11/10/2015  . Left hip postoperative wound infection 11/10/2015  . Osteoarthritis of left hip 09/09/2015  . 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 Obesity (BMI 35.0-39.9 without comorbidity) 04/28/2013    06/28/2013, PT, DPT 12/23/2020, 11:29 AM  [facility] [address] [place], [place], [place] Phone: [phone]   Fax:  [phone]  Name: Marilyn [name] MRN: [name] Date of Birth: 03-16-1971

## 2020-12-26 ENCOUNTER — Encounter: Payer: Self-pay | Admitting: Physician Assistant

## 2020-12-26 ENCOUNTER — Ambulatory Visit (INDEPENDENT_AMBULATORY_CARE_PROVIDER_SITE_OTHER): Payer: 59 | Admitting: Physician Assistant

## 2020-12-26 ENCOUNTER — Encounter: Payer: Self-pay | Admitting: Orthopedic Surgery

## 2020-12-26 DIAGNOSIS — M654 Radial styloid tenosynovitis [de Quervain]: Secondary | ICD-10-CM

## 2020-12-26 MED ORDER — LIDOCAINE HCL 1 % IJ SOLN
1.0000 mL | INTRAMUSCULAR | Status: AC | PRN
Start: 2020-12-26 — End: 2020-12-26
  Administered 2020-12-26: 1 mL

## 2020-12-26 MED ORDER — METHYLPREDNISOLONE ACETATE 40 MG/ML IJ SUSP
40.0000 mg | INTRAMUSCULAR | Status: AC | PRN
  Administered 2020-12-26: 40 mg

## 2020-12-26 NOTE — Progress Notes (Signed)
Office Visit Note   Patient: Marilyn [name]           Date of Birth: 1971-06-17           MRN: [id number] Visit Date: 12/19/2020              Requested by: Rebecka Apley, NP [address] [place],  [place] [address] PCP: Rebecka Apley, NP  Chief Complaint  Patient presents with  . Right Shoulder - Pain, Follow-up  . Left Shoulder - Pain, Follow-up      HPI: Patient is a 50 year old woman who presents complaining of bilateral shoulder pain left worse than right she states her left shoulder is locking up on her.  She has had subacromial injections about 2 months ago without relief.  She is currently taking Vicodin for her back.  Patient states she is currently going to physical therapy 2 times a week for her shoulders.  Patient states that she is going to possibly undergo a gastric sleeve.  Patient has had bilateral shoulder arthroscopy without relief.  Patient states she has had infections with both her back and hip surgery.  Assessment & Plan: Visit Diagnoses:  1. Chronic pain of both shoulders     Plan: Recommend continue with physical therapy she has not had relief with Voltaren gel for oral anti-inflammatories or injections.  Follow-Up Instructions: Return in about 4 weeks (around 01/16/2021).   Ortho Exam  Patient is alert, oriented, no adenopathy, well-dressed, normal affect, normal respiratory effort. Patient states that her knees are feeling better she states she can cross her legs at this time.  Examination she has full range of motion of the right shoulder but only has abduction and flexion of the left shoulder to 70 degrees with adhesive capsulitis.  She has pain with Neer and Hawkins impingement test.  Imaging: No results found. No images are attached to the encounter.  Labs: Lab Results  Component Value Date   HGBA1C 6.2 (H) 07/18/2016   HGBA1C 6.2 (H) 09/02/2015   ESRSEDRATE 56 (H) 01/08/2019   ESRSEDRATE CANCELED  09/18/2018   ESRSEDRATE 67 (H) 08/18/2018   CRP 30.7 (H) 01/08/2019   CRP 16.3 (H) 09/18/2018   CRP 20.9 (H) 08/18/2018   LABURIC 5.9 01/08/2019   LABURIC 8.7 (H) 06/16/2018   REPTSTATUS 05/22/2019 FINAL 05/20/2019   GRAMSTAIN  06/11/2018    FEW WBC PRESENT,BOTH PMN AND MONONUCLEAR NO ORGANISMS SEEN    CULT  05/20/2019    Multiple bacterial morphotypes present, none predominant. Suggest appropriate recollection if clinically indicated.   LABORGA PSEUDOMONAS AERUGINOSA 06/11/2018     Lab Results  Component Value Date   ALBUMIN 3.9 06/17/2019   ALBUMIN 4.1 05/20/2019   ALBUMIN 3.3 (L) 07/10/2018    Lab Results  Component Value Date   MG 1.7 05/20/2019   No results found for: VD25OH  No results found for: PREALBUMIN CBC EXTENDED Latest Ref Rng & Units 12/09/2019 06/24/2019 06/17/2019  WBC 4.0 - 10.5 K/uL 6.3 10.2 7.3  RBC 3.87 - 5.11 MIL/uL 4.03 3.77(L) 4.37  HGB 12.0 - 15.0 g/dL 11.2(L) 10.9(L) 12.3  HCT 36.0 - 46.0 % 37.8 34.5(L) 40.6  PLT 150 - 400 K/uL 225 265 294  NEUTROABS 1.7 - 7.7 K/uL 3.3 - -  LYMPHSABS 0.7 - 4.0 K/uL 2.2 - -     Body mass index is 63.77 kg/m.  Orders:  No orders of the defined types were placed in this encounter.  No  orders of the defined types were placed in this encounter.    Procedures: No procedures performed  Clinical Data: No additional findings.  ROS:  All other systems negative, except as noted in the HPI. Review of Systems  Objective: Vital Signs: Ht 5\' 3"  (1.6 m)   Wt (!) 360 lb (163.3 kg)   BMI 63.77 kg/m   Specialty Comments:  No specialty comments available.  PMFS History: Patient Active Problem List   Diagnosis Date Noted  . Leg pain, right 10/20/2019  . Dysesthesia 10/20/2019  . 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 Other spondylosis with radiculopathy, lumbar region   . Status post lumbar laminectomy 06/23/2019  . C. difficile colitis 05/22/2019  . Hypophosphatemia 05/21/2019  .  Syncope 05/20/2019  . OSA on CPAP   . Acute gastroenteritis   . Anemia due to blood loss, acute 06/18/2018    Class: Acute  . Plantar fasciitis of left foot 06/16/2018    Class: Chronic  . Tendonitis, Achilles, right 06/16/2018    Class: Chronic  . Osteochondral talar dome lesion 06/16/2018    Class: Chronic  . Deep incisional surgical site infection   . Superficial incisional surgical site infection 06/11/2018    Class: Acute  . Right ankle effusion 06/11/2018    Class: Acute  . Morbid (severe) obesity due to excess calories (HCC) 05/29/2018    Class: Chronic  . Spondylolisthesis, lumbar region 05/27/2018    Class: Chronic  . Spinal stenosis of lumbar region 05/27/2018    Class: Chronic  . Urinary tract infection 05/27/2018    Class: Acute  . Fusion of spine of lumbar region 05/27/2018  . Pain of right hip joint 07/03/2017  . Acute right-sided low back pain with right-sided sciatica 03/27/2017  . Chronic right shoulder pain 02/13/2017  . History of rotator cuff tear 11/30/2016  . Impingement syndrome of right shoulder 11/30/2016  . Exacerbation of asthma 07/18/2016  . Hypokalemia 07/18/2016  . Hypertension 07/18/2016  . Depression with anxiety 07/18/2016  . Hypothyroidism 07/18/2016  . Hyperglycemia 07/18/2016  . Acute asthma exacerbation 07/18/2016  . Surgical wound dehiscence left hip; questionable superficial infection 11/10/2015  . Left hip postoperative wound infection 11/10/2015  . Osteoarthritis of left hip 09/09/2015  . 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 Obesity (BMI 35.0-39.9 without comorbidity) 04/28/2013   Past Medical History:  Diagnosis Date  . Anemia    taking iron now. pt states having no current issues 09/02/2015  . Anginal pain (HCC)    pt states experiences chest wall pain pt states related to her asthma   . Anxiety    with MRI's  . Arthritis    Everywhere  . Asthma   . Depression   . Dizziness   . GERD (gastroesophageal  reflux disease)   . Headache(784.0)    HX OF MIGRAINES  . History of bronchitis   . Hypertension   . Hypothyroidism    takes levothyroxen  . OSA on CPAP    wears cpap  . Shortness of breath    with exertion  . Wears glasses     Family History  Problem Relation Age of Onset  . Cancer Mother        colon  . Epilepsy Mother   . Cancer Father        prostate  . Diabetes Father   . Hypertension Father   . Hypertension Maternal Aunt   . Diabetes Maternal Aunt   .  Hypertension Paternal Aunt     Past Surgical History:  Procedure Laterality Date  . BACK SURGERY    . CESAREAN SECTION     times 2  . CHOLECYSTECTOMY    . ENDOMETRIAL ABLATION    . INCISION AND DRAINAGE HIP Left 11/10/2015   Procedure: IRRIGATION AND DEBRIDEMENT LEFT HIP INCISION;  Surgeon: Kathryne Hitch, MD;  Location: [facility] OR;  Service: Orthopedics;  Laterality: Left;  . JOINT REPLACEMENT  2011   total left knee  . KNEE ARTHROPLASTY  04/23/2012   right   . KNEE ARTHROSCOPY    . LUMBAR LAMINECTOMY/DECOMPRESSION MICRODISCECTOMY N/A 06/23/2019   Procedure: RIGHT LUMBAR FIVE THROUGH SACRAL ONE PARTIAL HEMILAMINECTOMY WITH RIGHT LUMBAR FIVE FORAMINOTOMY;  Surgeon: Kerrin Champagne, MD;  Location: [facility] OR;  Service: Orthopedics;  Laterality: N/A;  . LUMBAR WOUND DEBRIDEMENT N/A 06/11/2018   Procedure: LUMBAR WOUND DEBRIDEMENT;  Surgeon: Kerrin Champagne, MD;  Location: [facility] OR;  Service: Orthopedics;  Laterality: N/A;  . RADIOLOGY WITH ANESTHESIA N/A 09/09/2018   Procedure: LUMBER SPINE WITHOUT CONTRAST;  Surgeon: Radiologist, Medication, MD;  Location: [facility] OR;  Service: Radiology;  Laterality: N/A;  . ROTATOR CUFF REPAIR     left   . SHOULDER SURGERY     right to repair ligament tear  . TOTAL HIP ARTHROPLASTY Left 09/09/2015   Procedure: LEFT TOTAL HIP ARTHROPLASTY ANTERIOR APPROACH;  Surgeon: Kathryne Hitch, MD;  Location: [facility];  Service: Orthopedics;  Laterality: Left;  . TOTAL KNEE ARTHROPLASTY  04/23/2012    Procedure: TOTAL KNEE ARTHROPLASTY;  Surgeon: [name], MD;  Location: [facility] OR;  Service: Orthopedics;  Laterality: Right;  Right Total Knee Arthroplasty  . TUBAL LIGATION  1996   Social History   Occupational History  . Not on file  Tobacco Use  . Smoking status: Former Smoker    Packs/day: 0.50    Years: 4.00    Pack years: 2.00    Quit date: 09/18/1991    Years since quitting: 29.2  . Smokeless tobacco: Never Used  Vaping Use  . Vaping Use: Never used  Substance and Sexual Activity  . Alcohol use: No  . Drug use: No  . Sexual activity: Yes    Birth control/protection: Surgical

## 2020-12-26 NOTE — Progress Notes (Signed)
   Procedure Note  Patient: Marilyn [name]             Date of Birth: [birth year]           MRN: [id number]             Visit Date: 12/26/2020 Marilyn [name] comes in today for wrist pain.  She was last seen 06/30/2020 significant injection for de Quervain's right wrist.  She states that the injection really helped.  Pain did not come back till recently.  She has tried Voltaren gel with no real relief.  She does try wrist brace but states this irritates it more.  She has had no new injury.  Physical exam: Right wrist she has tenderness over the first extensor compartment.  Positive Finkelstein's.  Negative grind test.  Full range of motion of the hand otherwise without pain.  No rashes skin lesions ulcerations or erythema.  Procedures: Visit Diagnoses:  1. De Quervain's disease (radial styloid tenosynovitis)     Hand/UE Inj for de Quervain's tenosynovitis on 12/26/2020 5:58 PM Medications: 1 mL lidocaine 1 %; 40 mg methylPREDNISolone acetate 40 MG/ML Consent was given by the patient. Immediately prior to procedure a time out was called to verify the correct patient, procedure, equipment, support staff and site/side marked as required. Patient was prepped and draped in the usual sterile fashion.      Plan: We will see how she does with the injection.  She will follow up with [place] as needed.  She understands she needs to wait at least 3 months between injections.  Questions encouraged and answered at length.

## 2020-12-28 ENCOUNTER — Other Ambulatory Visit: Payer: Self-pay

## 2020-12-28 ENCOUNTER — Encounter: Payer: Self-pay | Admitting: Physical Therapy

## 2020-12-28 ENCOUNTER — Ambulatory Visit: Payer: 59 | Admitting: Physical Therapy

## 2020-12-28 DIAGNOSIS — M25611 Stiffness of right shoulder, not elsewhere classified: Secondary | ICD-10-CM

## 2020-12-28 DIAGNOSIS — M25612 Stiffness of left shoulder, not elsewhere classified: Secondary | ICD-10-CM

## 2020-12-28 DIAGNOSIS — G8929 Other chronic pain: Secondary | ICD-10-CM

## 2020-12-28 DIAGNOSIS — R293 Abnormal posture: Secondary | ICD-10-CM

## 2020-12-28 DIAGNOSIS — M25511 Pain in right shoulder: Secondary | ICD-10-CM | POA: Diagnosis not present

## 2020-12-28 NOTE — Therapy (Signed)
[facility] [address] [place], [place], [address] Phone: [phone]   Fax:  [phone]  Physical Therapy Treatment  Patient Details  Name: SERAH NICOLETTI MRN: [id number] Date of Birth: September 18, 1970 Referring Provider (PT): Aldean Baker MD.   Encounter Date: 12/28/2020   PT End of Session - 12/28/20 1404    Visit Number 8    Number of Visits 12    Date for PT Re-Evaluation 02/26/21    Authorization Type PROGRESS NOTE AT 10TH VISIT.  KX MODIFIER AFTER 15 VISITS.    PT Start Time 1357    PT Stop Time 1445    PT Time Calculation (min) 48 min    Activity Tolerance Patient tolerated treatment well    Behavior During Therapy WFL for tasks assessed/performed           Past Medical History:  Diagnosis Date  . Anemia    taking iron now. pt states having no current issues 09/02/2015  . Anginal pain (HCC)    pt states experiences chest wall pain pt states related to her asthma   . Anxiety    with MRI's  . Arthritis    Everywhere  . Asthma   . Depression   . Dizziness   . GERD (gastroesophageal reflux disease)   . Headache(784.0)    HX OF MIGRAINES  . History of bronchitis   . Hypertension   . Hypothyroidism    takes levothyroxen  . OSA on CPAP    wears cpap  . Shortness of breath    with exertion  . Wears glasses     Past Surgical History:  Procedure Laterality Date  . BACK SURGERY    . CESAREAN SECTION     times 2  . CHOLECYSTECTOMY    . ENDOMETRIAL ABLATION    . INCISION AND DRAINAGE HIP Left 11/10/2015   Procedure: IRRIGATION AND DEBRIDEMENT LEFT HIP INCISION;  Surgeon: Kathryne Hitch, MD;  [facility];  Service: Orthopedics;  Laterality: Left;  . JOINT REPLACEMENT  2011   total left knee  . KNEE ARTHROPLASTY  04/23/2012   right   . KNEE ARTHROSCOPY    . LUMBAR LAMINECTOMY/DECOMPRESSION MICRODISCECTOMY N/A 06/23/2019   Procedure: RIGHT LUMBAR FIVE THROUGH SACRAL ONE PARTIAL HEMILAMINECTOMY WITH  RIGHT LUMBAR FIVE FORAMINOTOMY;  Surgeon: Kerrin Champagne, MD;  [facility];  Service: Orthopedics;  Laterality: N/A;  . LUMBAR WOUND DEBRIDEMENT N/A 06/11/2018   Procedure: LUMBAR WOUND DEBRIDEMENT;  Surgeon: Kerrin Champagne, MD;  Location: [facility] OR;  Service: Orthopedics;  Laterality: N/A;  . RADIOLOGY WITH ANESTHESIA N/A 09/09/2018   Procedure: LUMBER SPINE WITHOUT CONTRAST;  Surgeon: Radiologist, Medication, MD;  [facility];  Service: Radiology;  Laterality: N/A;  . ROTATOR CUFF REPAIR     left   . SHOULDER SURGERY     right to repair ligament tear  . TOTAL HIP ARTHROPLASTY Left 09/09/2015   Procedure: LEFT TOTAL HIP ARTHROPLASTY ANTERIOR APPROACH;  Surgeon: Kathryne Hitch, MD;  Location: [facility];  Service: Orthopedics;  Laterality: Left;  . TOTAL KNEE ARTHROPLASTY  04/23/2012   Procedure: TOTAL KNEE ARTHROPLASTY;  Surgeon: [name], MD;  [facility]: [facility];  Service: Orthopedics;  Laterality: Right;  Right Total Knee Arthroplasty  . TUBAL LIGATION  1996    There were no vitals filed for this visit.   Subjective Assessment - 12/28/20 1400    Subjective COVID-19 screen performed prior to patient entering clinic. R shoulder shoulder is  okay but popped as she was starting the pulley. L shoulder pain is continuous. Did her hair last night and states that may have caused the pain.    Pertinent History Hypothyroidism, back surgery, bilateral TKA's, left THA, bilateral shoulder surgeries.    Diagnostic tests X-rays.    Patient Stated Goals Improve shoulder range of motion with decreased pain.    Currently in Pain? Yes    Pain Score 7     Pain Location Shoulder    Pain Orientation Left    Pain Descriptors / Indicators Stabbing    Pain Type Chronic pain    Pain Onset More than a month ago    Pain Frequency Constant    Multiple Pain Sites Yes    Pain Score 4    Pain Location Shoulder    Pain Orientation Right    Pain Descriptors / Indicators Aching    Pain Type Chronic pain     Pain Onset More than a month ago    Pain Frequency Intermittent              [facility] PT Assessment - 12/28/20 0001      Assessment   Medical Diagnosis Chronic pain of both shoulders.    Referring Provider (PT) Aldean BakerMarcus Duda MD.    Next MD Visit n/a      Precautions   Precautions None                         [facility] - 12/28/20 0001      Shoulder Exercises: Seated   Extension Strengthening;Both;20 reps;Theraband    Theraband Level (Shoulder Extension) Level 1 (Yellow)    Retraction Strengthening;Both;20 reps;Theraband    Theraband Level (Shoulder Retraction) Level 1 (Yellow)    External Rotation Strengthening;Both;20 reps;Theraband    Theraband Level (Shoulder External Rotation) Level 1 (Yellow)    Internal Rotation Strengthening;Both;20 reps;Theraband    Theraband Level (Shoulder Internal Rotation) Level 1 (Yellow)      Shoulder Exercises: Standing   Other Standing Exercises wall slides x10 reps      Shoulder Exercises: Pulleys   Flexion 5 minutes    Other Pulley Exercises seated UE ranger for flex, cirles x5 min each UE      Modalities   Modalities Electrical Stimulation;Moist Heat      Moist Heat Therapy   Number Minutes Moist Heat 15 Minutes    Moist Heat Location Shoulder      Electrical Stimulation   Electrical Stimulation Location B shoulder    Electrical Stimulation Action IFC    Electrical Stimulation Parameters 80-150 hz x15 min    Electrical Stimulation Goals Pain                       PT Long Term Goals - 11/30/20 1446      PT LONG TERM GOAL #1   Title Patient will be independent with HEP    Time 6    Period Weeks    Status On-going      PT LONG TERM GOAL #2   Title Bilateral active shoulder flexion to 145 degrees so the patient can easily reach overhead.    Time 6    Period Weeks    Status On-going      PT LONG TERM GOAL #3   Title Bilateral active shoulder ER to 70 degrees+ to allow  for easily donning/doffing of apparel.    Time 6  Period Weeks    Status On-going      PT LONG TERM GOAL #4   Title Increase ROM so patient is able to reach behind back.    Time 6    Period Weeks    Status On-going      PT LONG TERM GOAL #5   Title Perform ADL's with pain not > 4/10.    Time 6    Period Weeks    Status On-going                 Plan - 12/28/20 1536    Clinical Impression Statement Patient presented in clinic with reports of more L shoulder pain today. Patient reports being told by MD that she will need a TSR for L shoulder. Patient guided through more AAROM and light strengthening with no complaints of increased pain. Patient fatigues with antigravity wall slides. Normal modalities response noted following removal of the modalities.    Personal Factors and Comorbidities Comorbidity 1;Comorbidity 2;Other    Comorbidities Hypothyroidism, back surgery, bilateral TKA's, left THA, bilateral shoulder surgeries.    Examination-Activity Limitations Other;Reach Overhead    Examination-Participation Restrictions Other    Stability/Clinical Decision Making Evolving/Moderate complexity    Rehab Potential Fair    PT Frequency 2x / week    PT Duration 6 weeks    PT Treatment/Interventions ADLs/Self Care Home Management;Cryotherapy;Electrical Stimulation;Ultrasound;Moist Heat;Iontophoresis 4mg /ml Dexamethasone;Therapeutic activities;Therapeutic exercise;Patient/family education;Manual techniques;Passive range of motion;Dry needling    PT Next Visit Plan cont with POC for Pulleys, UE Ranger, PROM to bilateral shoulder, wall ladder.  PRE's.  Modalities and STW/M as needed.    Consulted and Agree with Plan of Care Patient           Patient will benefit from skilled therapeutic intervention in order to improve the following deficits and impairments:  Pain,Decreased activity tolerance,Postural dysfunction,Decreased range of motion,Decreased strength  Visit  Diagnosis: Chronic right shoulder pain  Chronic left shoulder pain  Stiffness of left shoulder, not elsewhere classified  Stiffness of right shoulder, not elsewhere classified  Abnormal posture     Problem List Patient Active Problem List   Diagnosis Date Noted  . Leg pain, right 10/20/2019  . Dysesthesia 10/20/2019  . 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 Other spondylosis with radiculopathy, lumbar region   . Status post lumbar laminectomy 06/23/2019  . C. difficile colitis 05/22/2019  . Hypophosphatemia 05/21/2019  . Syncope 05/20/2019  . OSA on CPAP   . Acute gastroenteritis   . Anemia due to blood loss, acute 06/18/2018    Class: Acute  . Plantar fasciitis of left foot 06/16/2018    Class: Chronic  . Tendonitis, Achilles, right 06/16/2018    Class: Chronic  . Osteochondral talar dome lesion 06/16/2018    Class: Chronic  . Deep incisional surgical site infection   . Superficial incisional surgical site infection 06/11/2018    Class: Acute  . Right ankle effusion 06/11/2018    Class: Acute  . Morbid (severe) obesity due to excess calories (HCC) 05/29/2018    Class: Chronic  . Spondylolisthesis, lumbar region 05/27/2018    Class: Chronic  . Spinal stenosis of lumbar region 05/27/2018    Class: Chronic  . Urinary tract infection 05/27/2018    Class: Acute  . Fusion of spine of lumbar region 05/27/2018  . Pain of right hip joint 07/03/2017  . Acute right-sided low back pain with right-sided sciatica 03/27/2017  . Chronic right shoulder pain 02/13/2017  . History  of rotator cuff tear 11/30/2016  . Impingement syndrome of right shoulder 11/30/2016  . Exacerbation of asthma 07/18/2016  . Hypokalemia 07/18/2016  . Hypertension 07/18/2016  . Depression with anxiety 07/18/2016  . Hypothyroidism 07/18/2016  . Hyperglycemia 07/18/2016  . Acute asthma exacerbation 07/18/2016  . Surgical wound dehiscence left hip; questionable superficial  infection 11/10/2015  . Left hip postoperative wound infection 11/10/2015  . Osteoarthritis of left hip 09/09/2015  . 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 Obesity (BMI 35.0-39.9 without comorbidity) 04/28/2013    Marvell Fuller, PTA 12/28/2020, 3:44 PM  [facility] [address] [place], [place], [address] Phone: [phone]   Fax:  [phone]  Name: MATTYE VERDONE MRN: [id number] Date of Birth: September 11, 1971

## 2021-01-02 ENCOUNTER — Encounter: Payer: Self-pay | Admitting: Orthopaedic Surgery

## 2021-01-02 ENCOUNTER — Ambulatory Visit (INDEPENDENT_AMBULATORY_CARE_PROVIDER_SITE_OTHER): Payer: 59 | Admitting: Orthopaedic Surgery

## 2021-01-02 ENCOUNTER — Ambulatory Visit: Payer: 59

## 2021-01-02 DIAGNOSIS — M654 Radial styloid tenosynovitis [de Quervain]: Secondary | ICD-10-CM

## 2021-01-02 NOTE — Progress Notes (Signed)
The patient is a 50 year old who last week had a steroid injection in her right wrist for de Quervain's tenosynovitis.  She says that injections helped her quite a bit.  She comes in with a chief complaint today mainly of her right hip hurting her quite a bit.  She has known osteoarthritis of the right hip.  I have replaced her left hip in 2016 and her postoperative course was complicated by an infection which we were able to eradicate.  She is someone who is morbidly obese and I explained to her in detail that we are at a point where we cannot perform replacement surgery on people with BMI over 40.  Her thigh and soft tissue are so abundant in that area that I would not be comfortable performing the surgery at this standpoint.  Also we have had more complications with these high BMIs and even her for surgery on the left side had a significant complication with infection.  Encouraged weight loss still.  We can certainly see her back in 3 months for repeat weight and BMI calculation.  All question concerns were answered and addressed.

## 2021-01-06 ENCOUNTER — Ambulatory Visit: Payer: 59 | Admitting: Physical Therapy

## 2021-01-18 DIAGNOSIS — Z96653 Presence of artificial knee joint, bilateral: Secondary | ICD-10-CM | POA: Insufficient documentation

## 2021-01-22 ENCOUNTER — Other Ambulatory Visit (INDEPENDENT_AMBULATORY_CARE_PROVIDER_SITE_OTHER): Payer: Self-pay | Admitting: Specialist

## 2021-02-06 ENCOUNTER — Encounter: Payer: Self-pay | Admitting: Physician Assistant

## 2021-02-06 ENCOUNTER — Ambulatory Visit (INDEPENDENT_AMBULATORY_CARE_PROVIDER_SITE_OTHER): Payer: 59 | Admitting: Physician Assistant

## 2021-02-06 ENCOUNTER — Ambulatory Visit: Payer: Self-pay

## 2021-02-06 DIAGNOSIS — M25562 Pain in left knee: Secondary | ICD-10-CM | POA: Diagnosis not present

## 2021-02-06 NOTE — Progress Notes (Signed)
Office Visit Note   Patient: Marilyn [name]           Date of Birth: 11-13-1970           MRN: [id number] Visit Date: 02/06/2021              Requested by: Rebecka Apley, NP [address] [place],  [place] [address] PCP: Rebecka Apley, NP  Chief Complaint  Patient presents with  . Left Knee - Pain      HPI: Patient presents today complaining of left knee pain.  She is status post left knee replacement.  She says that she bent her knee 2 days ago and had significant pain and heard a lot of popping.  At that time the pain was 7 out of 10.  She now has decreased to 3 out of 10 she just wants to make sure she did not do anything to the knee replacement  Assessment & Plan: Visit Diagnoses:  1. Acute pain of left knee     Plan: She may treat this symptomatically with ice heat and over-the-counter medication I expect this is most likely due to some release of some scar tissue.  If she is not improved completely in 2 weeks she will contact us  Follow-Up Instructions: No follow-ups on file.   Ortho Exam  Patient is alert, oriented, no adenopathy, well-dressed, normal affect, normal respiratory effort. Examination of her left knee no effusion no cellulitis no erythema no swelling.  Well-healed surgical incision.  Mildly tender over the patella medially.  Excellent range of motion without difficulty.  Compartments are soft and nontender.  Imaging: XR Knee 1-2 Views Left  Result Date: 02/06/2021 2 views of her knee today demonstrate well-maintained alignment.  No acute osseous injuries.  Knee replacement remains in good position  No images are attached to the encounter.  Labs: Lab Results  Component Value Date   HGBA1C 6.2 (H) 07/18/2016   HGBA1C 6.2 (H) 09/02/2015   ESRSEDRATE 56 (H) 01/08/2019   ESRSEDRATE CANCELED 09/18/2018   ESRSEDRATE 67 (H) 08/18/2018   CRP 30.7 (H) 01/08/2019   CRP 16.3 (H) 09/18/2018   CRP 20.9 (H) 08/18/2018    LABURIC 5.9 01/08/2019   LABURIC 8.7 (H) 06/16/2018   REPTSTATUS 05/22/2019 FINAL 05/20/2019   GRAMSTAIN  06/11/2018    FEW WBC PRESENT,BOTH PMN AND MONONUCLEAR NO ORGANISMS SEEN    CULT  05/20/2019    Multiple bacterial morphotypes present, none predominant. Suggest appropriate recollection if clinically indicated.   LABORGA PSEUDOMONAS AERUGINOSA 06/11/2018     Lab Results  Component Value Date   ALBUMIN 3.9 06/17/2019   ALBUMIN 4.1 05/20/2019   ALBUMIN 3.3 (L) 07/10/2018    Lab Results  Component Value Date   MG 1.7 05/20/2019   No results found for: VD25OH  No results found for: PREALBUMIN CBC EXTENDED Latest Ref Rng & Units 12/09/2019 06/24/2019 06/17/2019  WBC 4.0 - 10.5 K/uL 6.3 10.2 7.3  RBC 3.87 - 5.11 MIL/uL 4.03 3.77(L) 4.37  HGB 12.0 - 15.0 g/dL 11.2(L) 10.9(L) 12.3  HCT 36.0 - 46.0 % 37.8 34.5(L) 40.6  PLT 150 - 400 K/uL 225 265 294  NEUTROABS 1.7 - 7.7 K/uL 3.3 - -  LYMPHSABS 0.7 - 4.0 K/uL 2.2 - -     There is no height or weight on file to calculate BMI.  Orders:  Orders Placed This Encounter  Procedures  . XR Knee 1-2 Views Left   No  orders of the defined types were placed in this encounter.    Procedures: No procedures performed  Clinical Data: No additional findings.  ROS:  All other systems negative, except as noted in the HPI. Review of Systems  Objective: Vital Signs: There were no vitals taken for this visit.  Specialty Comments:  No specialty comments available.  PMFS History: Patient Active Problem List   Diagnosis Date Noted  . Leg pain, right 10/20/2019  . Dysesthesia 10/20/2019  . 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 Other spondylosis with radiculopathy, lumbar region   . Status post lumbar laminectomy 06/23/2019  . C. difficile colitis 05/22/2019  . Hypophosphatemia 05/21/2019  . Syncope 05/20/2019  . OSA on CPAP   . Acute gastroenteritis   . Anemia due to blood loss, acute 06/18/2018     Class: Acute  . Plantar fasciitis of left foot 06/16/2018    Class: Chronic  . Tendonitis, Achilles, right 06/16/2018    Class: Chronic  . Osteochondral talar dome lesion 06/16/2018    Class: Chronic  . Deep incisional surgical site infection   . Superficial incisional surgical site infection 06/11/2018    Class: Acute  . Right ankle effusion 06/11/2018    Class: Acute  . Morbid (severe) obesity due to excess calories (HCC) 05/29/2018    Class: Chronic  . Spondylolisthesis, lumbar region 05/27/2018    Class: Chronic  . Spinal stenosis of lumbar region 05/27/2018    Class: Chronic  . Urinary tract infection 05/27/2018    Class: Acute  . Fusion of spine of lumbar region 05/27/2018  . Pain of right hip joint 07/03/2017  . Acute right-sided low back pain with right-sided sciatica 03/27/2017  . Chronic right shoulder pain 02/13/2017  . History of rotator cuff tear 11/30/2016  . Impingement syndrome of right shoulder 11/30/2016  . Exacerbation of asthma 07/18/2016  . Hypokalemia 07/18/2016  . Hypertension 07/18/2016  . Depression with anxiety 07/18/2016  . Hypothyroidism 07/18/2016  . Hyperglycemia 07/18/2016  . Acute asthma exacerbation 07/18/2016  . Surgical wound dehiscence left hip; questionable superficial infection 11/10/2015  . Left hip postoperative wound infection 11/10/2015  . Osteoarthritis of left hip 09/09/2015  . 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 Obesity (BMI 35.0-39.9 without comorbidity) 04/28/2013   Past Medical History:  Diagnosis Date  . Anemia    taking iron now. pt states having no current issues 09/02/2015  . Anginal pain (HCC)    pt states experiences chest wall pain pt states related to her asthma   . Anxiety    with MRI's  . Arthritis    Everywhere  . Asthma   . Depression   . Dizziness   . GERD (gastroesophageal reflux disease)   . Headache(784.0)    HX OF MIGRAINES  . History of bronchitis   . Hypertension   .  Hypothyroidism    takes levothyroxen  . OSA on CPAP    wears cpap  . Shortness of breath    with exertion  . Wears glasses     Family History  Problem Relation Age of Onset  . Cancer Mother        colon  . Epilepsy Mother   . Cancer Father        prostate  . Diabetes Father   . Hypertension Father   . Hypertension Maternal Aunt   . Diabetes Maternal Aunt   . Hypertension Paternal Aunt     Past Surgical History:  Procedure Laterality Date  . BACK SURGERY    . CESAREAN SECTION     times 2  . CHOLECYSTECTOMY    . ENDOMETRIAL ABLATION    . INCISION AND DRAINAGE HIP Left 11/10/2015   Procedure: IRRIGATION AND DEBRIDEMENT LEFT HIP INCISION;  Surgeon: Kathryne Hitch, MD;  [facility];  Service: Orthopedics;  Laterality: Left;  . JOINT REPLACEMENT  2011   total left knee  . KNEE ARTHROPLASTY  04/23/2012   right   . KNEE ARTHROSCOPY    . LUMBAR LAMINECTOMY/DECOMPRESSION MICRODISCECTOMY N/A 06/23/2019   Procedure: RIGHT LUMBAR FIVE THROUGH SACRAL ONE PARTIAL HEMILAMINECTOMY WITH RIGHT LUMBAR FIVE FORAMINOTOMY;  Surgeon: Kerrin Champagne, MD;  Location: [facility];  Service: Orthopedics;  Laterality: N/A;  . LUMBAR WOUND DEBRIDEMENT N/A 06/11/2018   Procedure: LUMBAR WOUND DEBRIDEMENT;  Surgeon: Kerrin Champagne, MD;  Location: [facility] OR;  Service: Orthopedics;  Laterality: N/A;  . RADIOLOGY WITH ANESTHESIA N/A 09/09/2018   Procedure: LUMBER SPINE WITHOUT CONTRAST;  Surgeon: Radiologist, Medication, MD;  Location: [facility];  Service: Radiology;  Laterality: N/A;  . ROTATOR CUFF REPAIR     left   . SHOULDER SURGERY     right to repair ligament tear  . TOTAL HIP ARTHROPLASTY Left 09/09/2015   Procedure: LEFT TOTAL HIP ARTHROPLASTY ANTERIOR APPROACH;  Surgeon: Kathryne Hitch, MD;  Location: [facility];  Service: Orthopedics;  Laterality: Left;  . TOTAL KNEE ARTHROPLASTY  04/23/2012   Procedure: TOTAL KNEE ARTHROPLASTY;  Surgeon: [name], MD;  Location: [facility];  Service: Orthopedics;   Laterality: Right;  Right Total Knee Arthroplasty  . TUBAL LIGATION  1996   Social History   Occupational History  . Not on file  Tobacco Use  . Smoking status: Former Smoker    Packs/day: 0.50    Years: 4.00    Pack years: 2.00    Quit date: 09/18/1991    Years since quitting: 29.4  . Smokeless tobacco: Never Used  Vaping Use  . Vaping Use: Never used  Substance and Sexual Activity  . Alcohol use: No  . Drug use: No  . Sexual activity: Yes    Birth control/protection: Surgical

## 2021-02-27 ENCOUNTER — Telehealth: Payer: Self-pay | Admitting: Surgery

## 2021-02-27 NOTE — Telephone Encounter (Signed)
Pt states she was returning a call from Dr. Barbaraann Faster and Adelene Amas nurse. The best call back number is [phone].

## 2021-03-08 ENCOUNTER — Institutional Professional Consult (permissible substitution): Payer: Medicare Other | Admitting: Plastic Surgery

## 2021-04-03 ENCOUNTER — Ambulatory Visit (INDEPENDENT_AMBULATORY_CARE_PROVIDER_SITE_OTHER): Payer: Medicare Other | Admitting: Orthopaedic Surgery

## 2021-04-03 ENCOUNTER — Encounter: Payer: Self-pay | Admitting: Orthopaedic Surgery

## 2021-04-03 ENCOUNTER — Other Ambulatory Visit: Payer: Self-pay

## 2021-04-03 VITALS — Ht 63.0 in | Wt 362.4 lb

## 2021-04-03 DIAGNOSIS — Z6841 Body Mass Index (BMI) 40.0 and over, adult: Secondary | ICD-10-CM

## 2021-04-03 DIAGNOSIS — M25551 Pain in right hip: Secondary | ICD-10-CM | POA: Diagnosis not present

## 2021-04-03 NOTE — Progress Notes (Signed)
The patient is well-known to me.  She has severe right hip pain and known significant arthritis of the right hip.  I actually replaced her left hip years ago.  She did have a postoperative complication with infection of that hip there was just soft tissue.  Unfortunate her BMI is 64.21 today.  Her weight is 362 pounds.  She is not in a position where we can comfortably schedule or perform hip replacement surgery.  She understands that the complication rate is somewhat shy with people BMI over 40 and she even had a soft tissue complication after her left hip was done many years ago.  I have had a higher complication rate as well and that at the point where I am not comfortable performing joint replacement surgery on such a high BMI due to the difficulty getting to the soft tissues and the experience we have had with soft tissue breakdown and infections.  She is considering bariatric surgical program.  Nothing I can do we will see her back in 6 months with a repeat weight and BMI calculation.

## 2021-04-05 ENCOUNTER — Encounter: Payer: Self-pay | Admitting: Plastic Surgery

## 2021-04-05 ENCOUNTER — Ambulatory Visit (INDEPENDENT_AMBULATORY_CARE_PROVIDER_SITE_OTHER): Payer: Medicare Other | Admitting: Plastic Surgery

## 2021-04-05 ENCOUNTER — Other Ambulatory Visit: Payer: Self-pay | Admitting: Adult Health Nurse Practitioner

## 2021-04-05 ENCOUNTER — Other Ambulatory Visit: Payer: Self-pay

## 2021-04-05 VITALS — BP 151/70 | HR 87 | Ht 63.0 in | Wt 363.8 lb

## 2021-04-05 DIAGNOSIS — Z1231 Encounter for screening mammogram for malignant neoplasm of breast: Secondary | ICD-10-CM

## 2021-04-05 DIAGNOSIS — N62 Hypertrophy of breast: Secondary | ICD-10-CM

## 2021-04-05 NOTE — Progress Notes (Signed)
Referring Provider Bridget Hartshorn, NP [place],  [place] [id number]   CC:  Chief Complaint  Patient presents with   consult      Marilyn [name] is an 50 y.o. female.  HPI: Patient presents to discuss breast reduction.  She has had years of back pain, neck pain and shoulder grooving related to her large breast.  She tried over-the-counter medications, warm packs, cold packs and supportive bras with little relief.  She is currently an I cup and wants to be proportional.  She also gets rashes beneath her breast that been refractory to over-the-counter treatment.  She does have a family history of breast cancer with 2 of her maternal aunts.  She quit smoking in 1989 and is not a diabetic.  She has been to physical therapy in the past with little sustained relief.  Allergies  Allergen Reactions   Lisinopril Anaphylaxis    Angioedema   Bee Venom Swelling and Other (See Comments)    Swelling at the site   Bupropion Swelling   Propoxyphene Hives   Codeine Nausea Only   Latex Rash   Meloxicam Other (See Comments)    Insomnia, constipation   Morphine And Related Rash   Tomato Rash    Outpatient Encounter Medications as of 04/05/2021  Medication Sig Note   albuterol (VENTOLIN HFA) 108 (90 Base) MCG/ACT inhaler Inhale 1-2 puffs into the lungs every 6 (six) hours as needed for wheezing or shortness of breath.    allopurinol (ZYLOPRIM) 100 MG tablet Take 1 tablet by mouth twice daily    ascorbic Acid (VITAMIN C) 500 MG CPCR Take by mouth.    buPROPion (WELLBUTRIN XL) 150 MG 24 hr tablet Take by mouth.    cetirizine (ZYRTEC) 10 MG tablet Take 10 mg by mouth daily.    Cholecalciferol (VITAMIN D) 50 MCG (2000 UT) CAPS Take 2,000 Units by mouth daily.    clotrimazole-betamethasone (LOTRISONE) cream     cyclobenzaprine (FLEXERIL) 10 MG tablet cyclobenzaprine 10 mg tablet  Take 1 tablet twice a day by oral route as needed.    diclofenac (VOLTAREN) 50 MG EC  tablet Take 1 tablet (50 mg total) by mouth 2 (two) times daily.    docusate sodium (COLACE) 100 MG capsule Take 1 capsule (100 mg total) by mouth 2 (two) times daily.    Ferrous Gluconate 324 (37.5 Fe) MG TABS Take 1 tablet by mouth 2 (two) times daily.    gabapentin (NEURONTIN) 400 MG capsule Take by mouth.    levothyroxine (SYNTHROID) 125 MCG tablet Take 125 mcg by mouth daily before breakfast.     Misc. Devices MISC Below the knee compression hose style calf Extra large size 1 pair 15-20 mmHg    Fax to [facility] in [place], [name]    NARCAN 4 MG/0.1ML LIQD nasal spray kit Place 0.4 mg into the nose as needed (opiate overdose).     ondansetron (ZOFRAN ODT) 4 MG disintegrating tablet Take 1 tablet (4 mg total) by mouth every 8 (eight) hours as needed for nausea or vomiting.    oxybutynin (DITROPAN XL) 15 MG 24 hr tablet Take 15 mg by mouth daily.     phosphorus (K PHOS NEUTRAL) 155-852-130 MG tablet Take 2 tablets (500 mg total) by mouth 4 (four) times daily.    potassium chloride 20 MEQ/15ML (10%) SOLN Take 20 mEq by mouth 2 (two) times daily. 12/09/2019: Last filled 08/30/19   pregabalin (LYRICA) 75 MG  capsule Take 1 capsule (75 mg total) by mouth 2 (two) times daily. 12/09/2019: Last filled 08/11/19   vancomycin (VANCOCIN) 50 mg/mL oral solution Take 2.5 mLs (125 mg total) by mouth 4 (four) times daily.    vitamin B-12 (CYANOCOBALAMIN) 250 MCG tablet Take 250 mcg by mouth daily.    zolpidem (AMBIEN) 10 MG tablet Take 10 mg by mouth at bedtime. 12/09/2019: Last filled 11/07/19 20 day supply   [DISCONTINUED] furosemide (LASIX) 20 MG tablet Take 20 mg by mouth daily as needed for fluid.  12/09/2019: Last filled 11/20/19 30 day supply   No facility-administered encounter medications on file as of 04/05/2021.     Past Medical History:  Diagnosis Date   Anemia    taking iron now. pt states having no current issues 09/02/2015   Anginal pain (HCC)    pt states experiences chest wall pain pt  states related to her asthma    Anxiety    with MRI's   Arthritis    Everywhere   Asthma    Depression    Dizziness    GERD (gastroesophageal reflux disease)    Headache(784.0)    HX OF MIGRAINES   History of bronchitis    Hypertension    Hypothyroidism    takes levothyroxen   OSA on CPAP    wears cpap   Shortness of breath    with exertion   Wears glasses     Past Surgical History:  Procedure Laterality Date   BACK SURGERY     CESAREAN SECTION     times 2   CHOLECYSTECTOMY     ENDOMETRIAL ABLATION     INCISION AND DRAINAGE HIP Left 11/10/2015   Procedure: IRRIGATION AND DEBRIDEMENT LEFT HIP INCISION;  Surgeon: Mcarthur Rossetti, MD;  Location: [facility];  Service: Orthopedics;  Laterality: Left;   JOINT REPLACEMENT  2011   total left knee   KNEE ARTHROPLASTY  04/23/2012   right    KNEE ARTHROSCOPY     LUMBAR LAMINECTOMY/DECOMPRESSION MICRODISCECTOMY N/A 06/23/2019   Procedure: RIGHT LUMBAR FIVE THROUGH SACRAL ONE PARTIAL HEMILAMINECTOMY WITH RIGHT LUMBAR FIVE FORAMINOTOMY;  Surgeon: Jessy Oto, MD;  Location: [facility];  Service: Orthopedics;  Laterality: N/A;   LUMBAR WOUND DEBRIDEMENT N/A 06/11/2018   Procedure: LUMBAR WOUND DEBRIDEMENT;  Surgeon: Jessy Oto, MD;  Location: [facility];  Service: Orthopedics;  Laterality: N/A;   RADIOLOGY WITH ANESTHESIA N/A 09/09/2018   Procedure: LUMBER SPINE WITHOUT CONTRAST;  Surgeon: Radiologist, Medication, MD;  Location: [facility];  Service: Radiology;  Laterality: N/A;   ROTATOR CUFF REPAIR     left    SHOULDER SURGERY     right to repair ligament tear   TOTAL HIP ARTHROPLASTY Left 09/09/2015   Procedure: LEFT TOTAL HIP ARTHROPLASTY ANTERIOR APPROACH;  Surgeon: Mcarthur Rossetti, MD;  Location: [facility];  Service: Orthopedics;  Laterality: Left;   TOTAL KNEE ARTHROPLASTY  04/23/2012   Procedure: TOTAL KNEE ARTHROPLASTY;  Surgeon: Newt Minion, MD;  Location: [facility];  Service: Orthopedics;  Laterality: Right;  Right Total Knee  Arthroplasty   TUBAL LIGATION  1996    Family History  Problem Relation Age of Onset   Cancer Mother        colon   Epilepsy Mother    Cancer Father        prostate   Diabetes Father    Hypertension Father    Hypertension Maternal Aunt    Diabetes Maternal Aunt  Hypertension Paternal Aunt     Social History   Social History Narrative   Passenger transport manager, 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  Sternal notch to nipple is 45 cm on the right and 42 cm on the left.  Nipple to fold is 21 cm bilaterally.  No obvious scars or masses.  Assessment/Plan I long discussion with the patient about breast reduction surgery.  We discussed the relationship between BMI and complications.  I explained that at this point in my opinion her BMI is too high for the procedure.  Her risk of wound healing complications and infections would be almost 100%.  She is in the healthy weight and wellness program now with [facility] and is working on getting her BMI down to be a candidate for weight loss surgery there.  I have offered to see her again should her situation change.  She was very understanding.  All her questions were answered.  Cindra Presume 04/05/2021, 2:56 PM

## 2021-04-19 ENCOUNTER — Other Ambulatory Visit (HOSPITAL_COMMUNITY): Payer: Self-pay | Admitting: Neurology

## 2021-04-19 DIAGNOSIS — M542 Cervicalgia: Secondary | ICD-10-CM

## 2021-04-26 ENCOUNTER — Telehealth: Payer: Self-pay | Admitting: Neurology

## 2021-04-26 NOTE — Telephone Encounter (Signed)
Do you mind reaching out to patient to get her a copy of the EMG/NCS results? The AVS would not show diagnosis necessarily but this will be on the report that we can provide her. Thank you

## 2021-04-26 NOTE — Telephone Encounter (Signed)
Ladies this patient was speaking with Shanda Bumps and she was told to get an AVS at checkout. The AVS does not show her diagnosis from the Nerve Cond Study done by Dr. Epimenio Foot 2.2.2021. She would like a call back to discuss please.

## 2021-04-26 NOTE — Telephone Encounter (Signed)
I called pt to let her know that her emg sent to my chart.

## 2021-05-01 ENCOUNTER — Telehealth: Payer: Self-pay | Admitting: Physical Medicine and Rehabilitation

## 2021-05-01 ENCOUNTER — Other Ambulatory Visit: Payer: Self-pay

## 2021-05-01 ENCOUNTER — Ambulatory Visit (HOSPITAL_COMMUNITY)
Admission: RE | Admit: 2021-05-01 | Discharge: 2021-05-01 | Disposition: A | Discharge status: Home / Self Care | Payer: Medicare Other | Source: Ambulatory Visit | Attending: Neurology | Admitting: Neurology

## 2021-05-01 DIAGNOSIS — M542 Cervicalgia: Secondary | ICD-10-CM | POA: Diagnosis present

## 2021-05-01 NOTE — Telephone Encounter (Signed)
Patient called. She would like an appointment with Dr. Alvester Morin. Her call back number is [phone]

## 2021-05-02 NOTE — Telephone Encounter (Signed)
Scheduled for OV 8/23.

## 2021-05-09 ENCOUNTER — Ambulatory Visit (INDEPENDENT_AMBULATORY_CARE_PROVIDER_SITE_OTHER): Payer: Medicare Other | Admitting: Physical Medicine and Rehabilitation

## 2021-05-09 ENCOUNTER — Ambulatory Visit: Payer: Self-pay

## 2021-05-09 ENCOUNTER — Other Ambulatory Visit: Payer: Self-pay

## 2021-05-09 ENCOUNTER — Encounter: Payer: Self-pay | Admitting: Physical Medicine and Rehabilitation

## 2021-05-09 VITALS — BP 137/84 | HR 64

## 2021-05-09 DIAGNOSIS — M7061 Trochanteric bursitis, right hip: Secondary | ICD-10-CM | POA: Diagnosis not present

## 2021-05-09 DIAGNOSIS — Z9689 Presence of other specified functional implants: Secondary | ICD-10-CM | POA: Diagnosis not present

## 2021-05-09 DIAGNOSIS — Z96642 Presence of left artificial hip joint: Secondary | ICD-10-CM

## 2021-05-09 DIAGNOSIS — S29012A Strain of muscle and tendon of back wall of thorax, initial encounter: Secondary | ICD-10-CM

## 2021-05-09 DIAGNOSIS — W19XXXA Unspecified fall, initial encounter: Secondary | ICD-10-CM

## 2021-05-09 DIAGNOSIS — S39012A Strain of muscle, fascia and tendon of lower back, initial encounter: Secondary | ICD-10-CM

## 2021-05-09 DIAGNOSIS — M1611 Unilateral primary osteoarthritis, right hip: Secondary | ICD-10-CM | POA: Insufficient documentation

## 2021-05-09 DIAGNOSIS — Z981 Arthrodesis status: Secondary | ICD-10-CM | POA: Insufficient documentation

## 2021-05-09 MED ORDER — LIDOCAINE HCL 2 % IJ SOLN
4.0000 mL | INTRAMUSCULAR | Status: AC | PRN
  Administered 2021-05-09: 4 mL

## 2021-05-09 MED ORDER — TRIAMCINOLONE ACETONIDE 40 MG/ML IJ SUSP
60.0000 mg | INTRAMUSCULAR | Status: AC | PRN
  Administered 2021-05-09: 60 mg via INTRA_ARTICULAR

## 2021-05-09 MED ORDER — BUPIVACAINE HCL 0.25 % IJ SOLN
4.0000 mL | INTRAMUSCULAR | Status: AC | PRN
Start: 2021-05-09 — End: 2021-05-09
  Administered 2021-05-09: 4 mL via INTRA_ARTICULAR

## 2021-05-09 NOTE — Progress Notes (Signed)
Marilyn Redderhelma M Schmuck - 50 y.o. female MRN [id number]  Date of birth: [birth year]  Office Visit Note: Visit Date: 05/09/2021 PCP: Rebecka ApleyHemberg, Katherine V, NP Referred by: Rebecka ApleyHemberg, Katherine V, NP  Subjective: Chief Complaint  Patient presents with   Right Thigh - Pain   HPI: Marilyn [name] is a 50 y.o. female who comes in today For evaluation and management of 1 week of upper back pain between the shoulder blades as well as right lateral hip pain to the knee.  She reports Thursday of last week she was walking down a ramp at her house and her left foot slid out from under her and she fell backwards onto the ramp impacting her buttock area and her upper back onto the ramp.  She reports no loss of consciousness or head injury or any dizziness causing the fall just slipping.  She has had a history of some falls in the past.  She reports since that time average pain increase of 3 out of 10 but particularly the right hip and thigh pain.  Right thigh pain is worse with laying on the right side and is quite severe at times and really limiting what she can do.  She does not describe any focal weakness seen the radicular type pain with paresthesias.  Her pain history is quite complex.  She has had a history of prior left total hip replacement she has had a history of lumbar fusion on 2 occasions through Dr. Otelia SergeantNitka.  The total hip replacement was by Dr. Magnus IvanBlackman.  She has end-stage arthritis of the right hip currently.  She has been followed in the office by several orthopedic doctors.  Her case is complicated by morbid obesity for which she is being seen at bariatrics and following through with them.  The last time I saw the patient was in the spring 2021.  She went on to [facility] Roderic [facility] and had indwelling spinal cord stimulator placed.  She does feel the paresthesias in the legs and does feel like this does help her leg pain quite a bit.  She also takes oxycodone 7.5 mg for  which she receives about 115 tablets/month.  This is prescribed by Jorge MandrilAyeshia Powell, FNP at [facility].  Controlled substance list reviewed.  Patient does have intolerances to a lot of medications but does at least currently by review of prescriptions tolerate the oxycodone.  She has tried gentle stretching and resting and change position for her current new onset pain.  Review of Systems  Musculoskeletal:  Positive for back pain, falls and joint pain.  All other systems reviewed and are negative. Otherwise per HPI.  Assessment & Plan: Visit Diagnoses:    ICD-10-CM   1. Greater trochanteric bursitis, right  M70.61 XR C-ARM NO REPORT    2. Strain of lumbar region, initial encounter  S39.012A     3. Unilateral primary osteoarthritis, right hip  M16.11     4. H/O total hip arthroplasty, left  Z96.642     5. Upper back strain, initial encounter  S29.012A     6. S/P insertion of spinal cord stimulator  Z96.89     7. S/P lumbar spinal fusion  Z98.1     8. Fall, initial encounter  W19.XXXA        Plan: Findings:  1.  Right low back lateral hip and leg pain.  No paresthesias no pain with hip rotation no groin pain.  This seems to  be a stress strain greater trochanteric bursitis type pain.  Lajoyce Corners the severity of the symptoms we did complete a diagnostic and hopefully therapeutic bursa injection today.  Duda body habitus did perform with fluoroscopic guidance.  Patient will monitor this for the next several weeks and I think this will get better on its own.  She was encouraged to do gentle stretching and stay active.  No red flag complaints to warrant really any imaging at this point.  I feel like with the stimulator in place and she is getting paresthesias in both legs there is no real chance of any movement there but she could follow-up with Dr. Curley Spice group if she really felt like that was something that needed to be done.  She can contract [employer] for consideration of reprogramming as  needed.  She will continue taking chronic pain medications from [facility].  All pain medications would need to be done through them.  2.  Mid back pain between the shoulder blades is again more of a stress strain type of activity with some myofascial pain.  I think this will resolve on its own in the next few weeks.  Again no red flag complaints to warrant imaging.  There was pain to palpation along the musculature only.  She did fall flat on the back there.  Consider x-ray imaging if pain persist to see if there is anything like a compression fracture.     Meds & Orders: No orders of the defined types were placed in this encounter.   Orders Placed This Encounter  Procedures   Large Joint Inj   XR C-ARM NO REPORT    Follow-up: Return if symptoms worsen or fail to improve.   Procedures: Large Joint Inj: R greater trochanter on 05/09/2021 12:57 PM Indications: pain and diagnostic evaluation Details: 22 G 3.5 in needle, fluoroscopy-guided lateral approach  Arthrogram: No  Medications: 4 mL lidocaine 2 %; 4 mL bupivacaine 0.25 %; 60 mg triamcinolone acetonide 40 MG/ML Outcome: tolerated well, no immediate complications  There was excellent flow of contrast outlined the greater trochanteric bursa without vascular uptake. Procedure, treatment alternatives, risks and benefits explained, specific risks discussed. Consent was given by the patient. Immediately prior to procedure a time out was called to verify the correct patient, procedure, equipment, support staff and site/side marked as required. Patient was prepped and draped in the usual sterile fashion.         Clinical History: CT Lumbar IMPRESSION: 1. Mild spinal stenosis at L3-4. 2. Solid fusions at L4-5 and L5-S1 with no residual neural impingement. 3. Hepatic steatosis.     Electronically Signed   By: Francene Boyers M.D.   On: 11/13/2019 15:02 ---  MRI LUMBAR SPINE WITHOUT CONTRAST  IMPRESSION:  Status post  L4-S1 TLIF. No significant postoperative extra-spinal  fluid collection. No features concerning for discitis/osteomyelitis.     Slight inferior L4 endplate T2 and STIR hyperintensity, favored to  represent early cage subsidence. Continued surveillance is  warranted.     Improved stenosis and subarticular zone narrowing at L4-5 and L5-S1.  Correlate clinically for residual symptomatic RIGHT-sided foraminal  narrowing at either or both levels.     BILATERAL foraminal narrowing is observed at L3-L4, without interval  changes of disc protrusion/adjacent segment disease. Either L3 nerve  root could be affected.      Electronically Signed    By: Elsie Stain M.D.    On: 09/09/2018 10:00  --  MR OF THE RIGHT  HIP WITHOUT CONTRAST        FINDINGS:  Bones: Prior left hip arthroplasty with susceptibility artifact  partially obscuring the adjacent soft tissue and osseous structures.  No periarticular fluid collection or osteolysis.     No right hip fracture, dislocation or avascular necrosis. Superior  and inferior pubic rami are intact. No focal marrow signal  abnormality. Normal sacrum and sacroiliac joints.     Articular cartilage and labrum     Articular cartilage:  Chondromalacia of the right hip.     Labrum: Severe superior anterior right labral degeneration with a  tear.     Joint or bursal effusion     Joint effusion:  No joint effusion.     Bursae:  No bursa formation.     Muscles and tendons     Flexors: Normal.     Extensors: Normal.     Abductors: Normal.     Adductors: Normal.     Rotators: Normal.     Hamstrings: Normal.     Other findings     Miscellaneous: No fluid collection or hematoma. No pelvic free  fluid. Postsurgical changes in the soft tissues overlying the left  hip.     IMPRESSION:  1. Severe superior anterior right labral degeneration with a tear.  2. Chondromalacia of the right hip.  3. Prior left hip arthroplasty with  susceptibility artifact  partially obscuring the adjacent soft tissue and osseous structures.  No periarticular fluid collection or osteolysis.        Electronically Signed    By: Elige Ko    On: 01/29/2016 13:23   She reports that she quit smoking about 33 years ago. Her smoking use included cigarettes. She has a 2.00 pack-year smoking history. She has never used smokeless tobacco. No results for input(s): HGBA1C, LABURIC in the last 8760 hours.  Objective:  VS:  HT:    WT:   BMI:     BP:137/84  HR:64bpm  TEMP: ( )  RESP:  Physical Exam Vitals and nursing note reviewed.  Constitutional:      General: She is not in acute distress.    Appearance: Normal appearance. She is well-developed. She is obese. She is not ill-appearing.  HENT:     Head: Normocephalic and atraumatic.  Eyes:     Conjunctiva/sclera: Conjunctivae normal.     Pupils: Pupils are equal, round, and reactive to light.  Cardiovascular:     Rate and Rhythm: Normal rate.     Pulses: Normal pulses.  Pulmonary:     Effort: Pulmonary effort is normal.  Abdominal:     General: There is no distension.     Palpations: Abdomen is soft.  Musculoskeletal:        General: Tenderness present.     Cervical back: Normal range of motion and neck supple. Tenderness present.     Right lower leg: Edema present.     Left lower leg: Edema present.     Comments: Examination of the upper back showed good range of motion of the shoulder blades and abduction with negative drop arm test but with decreased range of motion with reaching behind her back.  She did have pain to palpation along the rhomboid musculature and paraspinal musculature but no pain over the vertebral bodies.  No step-offs noted.  Examination of the right hip and leg shows no pain with hip rotation.  Pain over the greater trochanter to deep palpation.  Negative slump test.  She has good  strength in the lower extremities without any deficit side to side.  She has  intact sensation.  Skin:    General: Skin is warm and dry.     Findings: No erythema or rash.  Neurological:     General: No focal deficit present.     Mental Status: She is alert and oriented to person, place, and time.     Cranial Nerves: No cranial nerve deficit.     Sensory: No sensory deficit.     Motor: No weakness or abnormal muscle tone.     Coordination: Coordination normal.     Gait: Gait abnormal.  Psychiatric:        Mood and Affect: Mood normal.        Behavior: Behavior normal.    Ortho Exam  Imaging: XR C-ARM NO REPORT  Result Date: 05/09/2021 Please see Notes tab for imaging impression.   Past Medical/Family/Surgical/Social History: Medications & Allergies reviewed per EMR, new medications updated. Patient Active Problem List   Diagnosis Date Noted   Unilateral primary osteoarthritis, right hip 05/09/2021   S/P lumbar spinal fusion 05/09/2021   S/P insertion of spinal cord stimulator 05/09/2021   H/O total knee replacement, bilateral 01/18/2021   Lumbar post-laminectomy syndrome 04/06/2020   Long-term current use of opiate analgesic 11/04/2019   Leg pain, right 10/20/2019   Dysesthesia 10/20/2019   Lateral femoral cutaneous neuropathy, 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 difficile colitis 05/22/2019   Hypophosphatemia 05/21/2019   Syncope 05/20/2019   OSA on CPAP    Acute gastroenteritis    Anemia due to blood loss, acute 06/18/2018    Class: Acute   Plantar fasciitis of left foot 06/16/2018    Class: Chronic   Tendonitis, Achilles, right 06/16/2018    Class: Chronic   Osteochondral talar dome lesion 06/16/2018    Class: Chronic   Deep incisional surgical site infection    Superficial incisional surgical site infection 06/11/2018    Class: Acute   Right ankle effusion 06/11/2018    Class: Acute   Morbid (severe) obesity due to excess calories (HCC) 05/29/2018     Class: Chronic   Spondylolisthesis, lumbar region 05/27/2018    Class: Chronic   Spinal stenosis of lumbar region 05/27/2018    Class: Chronic   Urinary tract infection 05/27/2018    Class: Acute   Fusion of spine of lumbar region 05/27/2018   Pain of right hip joint 07/03/2017   Acute right-sided low back pain with right-sided sciatica 03/27/2017   Chronic right shoulder pain 02/13/2017   History of rotator cuff tear 11/30/2016   Impingement syndrome of right shoulder 11/30/2016   Exacerbation of asthma 07/18/2016   Hypokalemia 07/18/2016   Hypertension 07/18/2016   Depression with anxiety 07/18/2016   Hypothyroidism 07/18/2016   Hyperglycemia 07/18/2016   Acute asthma exacerbation 07/18/2016   Surgical wound dehiscence left hip; questionable superficial infection 11/10/2015   Left hip postoperative wound infection 11/10/2015   Osteoarthritis of left hip 09/09/2015   Status post total replacement of left hip 09/09/2015   Obesity (BMI 35.0-39.9 without comorbidity) 04/28/2013   Past Medical History:  Diagnosis Date   Anemia    taking iron now. pt states having no current issues 09/02/2015   Anginal pain (HCC)    pt states experiences chest wall pain pt states related to her asthma    Anxiety    with MRI's  Arthritis    Everywhere   Asthma    Depression    Dizziness    GERD (gastroesophageal reflux disease)    Headache(784.0)    HX OF MIGRAINES   History of bronchitis    Hypertension    Hypothyroidism    takes levothyroxen   OSA on CPAP    wears cpap   Shortness of breath    with exertion   Wears glasses    Family History  Problem Relation Age of Onset   Cancer Mother        colon   Epilepsy Mother    Cancer Father        prostate   Diabetes Father    Hypertension Father    Hypertension Maternal Aunt    Diabetes Maternal Aunt    Hypertension Paternal Aunt    Past Surgical History:  Procedure Laterality Date   BACK SURGERY     CESAREAN SECTION      times 2   CHOLECYSTECTOMY     ENDOMETRIAL ABLATION     INCISION AND DRAINAGE HIP Left 11/10/2015   Procedure: IRRIGATION AND DEBRIDEMENT LEFT HIP INCISION;  Surgeon: Kathryne Hitch, MD;  Location: [facility];  Service: Orthopedics;  Laterality: Left;   JOINT REPLACEMENT  2011   total left knee   KNEE ARTHROPLASTY  04/23/2012   right    KNEE ARTHROSCOPY     LUMBAR LAMINECTOMY/DECOMPRESSION MICRODISCECTOMY N/A 06/23/2019   Procedure: RIGHT LUMBAR FIVE THROUGH SACRAL ONE PARTIAL HEMILAMINECTOMY WITH RIGHT LUMBAR FIVE FORAMINOTOMY;  Surgeon: Kerrin Champagne, MD;  Location: [facility];  Service: Orthopedics;  Laterality: N/A;   LUMBAR WOUND DEBRIDEMENT N/A 06/11/2018   Procedure: LUMBAR WOUND DEBRIDEMENT;  Surgeon: Kerrin Champagne, MD;  Location: [facility];  Service: Orthopedics;  Laterality: N/A;   RADIOLOGY WITH ANESTHESIA N/A 09/09/2018   Procedure: LUMBER SPINE WITHOUT CONTRAST;  Surgeon: Radiologist, Medication, MD;  Location: [facility];  Service: Radiology;  Laterality: N/A;   ROTATOR CUFF REPAIR     left    SHOULDER SURGERY     right to repair ligament tear   TOTAL HIP ARTHROPLASTY Left 09/09/2015   Procedure: LEFT TOTAL HIP ARTHROPLASTY ANTERIOR APPROACH;  Surgeon: Kathryne Hitch, MD;  Location: [facility];  Service: Orthopedics;  Laterality: Left;   TOTAL KNEE ARTHROPLASTY  04/23/2012   Procedure: TOTAL KNEE ARTHROPLASTY;  Surgeon: [name], MD;  Location: [facility];  Service: Orthopedics;  Laterality: Right;  Right Total Knee Arthroplasty   TUBAL LIGATION  1996   Social History   Occupational History   Not on file  Tobacco Use   Smoking status: Former    Packs/day: 0.50    Years: 4.00    Pack years: 2.00    Types: Cigarettes    Quit date: 09/18/1987    Years since quitting: 33.6   Smokeless tobacco: Never  Vaping Use   Vaping Use: Never used  Substance and Sexual Activity   Alcohol use: No   Drug use: No   Sexual activity: Yes    Birth control/protection: Surgical

## 2021-05-09 NOTE — Progress Notes (Signed)
Fell last week. Now having pain radiating into lateral right thigh. States that this pain was relieved after decompression until she fell.  SCS- Dr. Roderic Ovens- feels like this helps with leg pain most of the time Numeric Pain Rating Scale and Functional Assessment Average Pain 3 Pain Right Now 3 My pain is constant, sharp, and aching Pain is worse with: unsure Pain improves with: rest   In the last MONTH (on 0-10 scale) has pain interfered with the following?  1. General activity like being  able to carry out your everyday physical activities such as walking, climbing stairs, carrying groceries, or moving a chair?  Rating(4)  2. Relation with others like being able to carry out your usual social activities and roles such as  activities at home, at work and in your community. Rating(4)  3. Enjoyment of life such that you have  been bothered by emotional problems such as feeling anxious, depressed or irritable?  Rating(2)

## 2021-05-29 ENCOUNTER — Ambulatory Visit: Payer: Medicare Other

## 2021-07-16 ENCOUNTER — Other Ambulatory Visit (INDEPENDENT_AMBULATORY_CARE_PROVIDER_SITE_OTHER): Payer: Self-pay | Admitting: Specialist

## 2021-08-03 ENCOUNTER — Ambulatory Visit (INDEPENDENT_AMBULATORY_CARE_PROVIDER_SITE_OTHER): Payer: Medicare Other | Admitting: Orthopedic Surgery

## 2021-08-03 ENCOUNTER — Other Ambulatory Visit: Payer: Self-pay

## 2021-08-03 ENCOUNTER — Ambulatory Visit: Payer: Self-pay

## 2021-08-03 DIAGNOSIS — M25512 Pain in left shoulder: Secondary | ICD-10-CM | POA: Diagnosis not present

## 2021-08-03 DIAGNOSIS — G8929 Other chronic pain: Secondary | ICD-10-CM

## 2021-08-04 ENCOUNTER — Encounter: Payer: Self-pay | Admitting: Orthopedic Surgery

## 2021-08-04 NOTE — Progress Notes (Signed)
Office Visit Note   Patient: Marilyn [name]           Date of Birth: 14-Apr-1971           MRN: [id number] Visit Date: 08/03/2021              Requested by: Rebecka Apley, NP [address] [place],  [place] [address] PCP: Rebecka Apley, NP  Chief Complaint  Patient presents with   Left Shoulder - Pain      HPI: Patient is a 50 year old woman who presents complaining of increasing pain and decreasing range of motion of her left shoulder.  She has to manually move her arm with the other hand.  She has had injections recently without relief.  She has been to physical therapy.  Assessment & Plan: Visit Diagnoses:  1. Chronic left shoulder pain     Plan: I will have her follow-up with Dr. August Saucer to see what his thoughts are with the end-stage arthritis of the left shoulder.  This is a difficult problem.  Follow-Up Instructions: Return if symptoms worsen or fail to improve.   Ortho Exam  Patient is alert, oriented, no adenopathy, well-dressed, normal affect, normal respiratory effort. Examination patient has abduction and flexion of 70 degrees she only has about 20 degrees of internal and external rotation of the left shoulder.  There is pain with attempted range of motion.  Imaging: No results found. No images are attached to the encounter.  Labs: Lab Results  Component Value Date   HGBA1C 6.2 (H) 07/18/2016   HGBA1C 6.2 (H) 09/02/2015   ESRSEDRATE 56 (H) 01/08/2019   ESRSEDRATE CANCELED 09/18/2018   ESRSEDRATE 67 (H) 08/18/2018   CRP 30.7 (H) 01/08/2019   CRP 16.3 (H) 09/18/2018   CRP 20.9 (H) 08/18/2018   LABURIC 5.9 01/08/2019   LABURIC 8.7 (H) 06/16/2018   REPTSTATUS 05/22/2019 FINAL 05/20/2019   GRAMSTAIN  06/11/2018    FEW WBC PRESENT,BOTH PMN AND MONONUCLEAR NO ORGANISMS SEEN    CULT  05/20/2019    Multiple bacterial morphotypes present, none predominant. Suggest appropriate recollection if clinically indicated.   LABORGA  PSEUDOMONAS AERUGINOSA 06/11/2018     Lab Results  Component Value Date   ALBUMIN 3.9 06/17/2019   ALBUMIN 4.1 05/20/2019   ALBUMIN 3.3 (L) 07/10/2018    Lab Results  Component Value Date   MG 1.7 05/20/2019   No results found for: VD25OH  No results found for: PREALBUMIN CBC EXTENDED Latest Ref Rng & Units 12/09/2019 06/24/2019 06/17/2019  WBC 4.0 - 10.5 K/uL 6.3 10.2 7.3  RBC 3.87 - 5.11 MIL/uL 4.03 3.77(L) 4.37  HGB 12.0 - 15.0 g/dL 11.2(L) 10.9(L) 12.3  HCT 36.0 - 46.0 % 37.8 34.5(L) 40.6  PLT 150 - 400 K/uL 225 265 294  NEUTROABS 1.7 - 7.7 K/uL 3.3 - -  LYMPHSABS 0.7 - 4.0 K/uL 2.2 - -     There is no height or weight on file to calculate BMI.  Orders:  Orders Placed This Encounter  Procedures   XR Shoulder Left   No orders of the defined types were placed in this encounter.    Procedures: No procedures performed  Clinical Data: No additional findings.  ROS:  All other systems negative, except as noted in the HPI. Review of Systems  Objective: Vital Signs: There were no vitals taken for this visit.  Specialty Comments:  No specialty comments available.  PMFS History: Patient Active Problem List  Diagnosis Date Noted   Unilateral primary osteoarthritis, right hip 05/09/2021   S/P lumbar spinal fusion 05/09/2021   S/P insertion of spinal cord stimulator 05/09/2021   H/O total knee replacement, bilateral 01/18/2021   Lumbar post-laminectomy syndrome 04/06/2020   Long-term current use of opiate analgesic 11/04/2019   Leg pain, right 10/20/2019   Dysesthesia 10/20/2019   Lateral femoral cutaneous neuropathy, 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 difficile colitis 05/22/2019   Hypophosphatemia 05/21/2019   Syncope 05/20/2019   OSA on CPAP    Acute gastroenteritis    Anemia due to blood loss, acute 06/18/2018    Class: Acute   Plantar fasciitis of left foot  06/16/2018    Class: Chronic   Tendonitis, Achilles, right 06/16/2018    Class: Chronic   Osteochondral talar dome lesion 06/16/2018    Class: Chronic   Deep incisional surgical site infection    Superficial incisional surgical site infection 06/11/2018    Class: Acute   Right ankle effusion 06/11/2018    Class: Acute   Morbid (severe) obesity due to excess calories ([place]) 05/29/2018    Class: Chronic   Spondylolisthesis, lumbar region 05/27/2018    Class: Chronic   Spinal stenosis of lumbar region 05/27/2018    Class: Chronic   Urinary tract infection 05/27/2018    Class: Acute   Fusion of spine of lumbar region 05/27/2018   Pain of right hip joint 07/03/2017   Acute right-sided low back pain with right-sided sciatica 03/27/2017   Chronic right shoulder pain 02/13/2017   History of rotator cuff tear 11/30/2016   Impingement syndrome of right shoulder 11/30/2016   Exacerbation of asthma 07/18/2016   Hypokalemia 07/18/2016   Hypertension 07/18/2016   Depression with anxiety 07/18/2016   Hypothyroidism 07/18/2016   Hyperglycemia 07/18/2016   Acute asthma exacerbation 07/18/2016   Surgical wound dehiscence left hip; questionable superficial infection 11/10/2015   Left hip postoperative wound infection 11/10/2015   Osteoarthritis of left hip 09/09/2015   Status post total replacement of left hip 09/09/2015   Obesity (BMI 35.0-39.9 without comorbidity) 04/28/2013   Past Medical History:  Diagnosis Date   Anemia    taking iron now. pt states having no current issues 09/02/2015   Anginal pain (HCC)    pt states experiences chest wall pain pt states related to her asthma    Anxiety    with MRI's   Arthritis    Everywhere   Asthma    Depression    Dizziness    GERD (gastroesophageal reflux disease)    Headache(784.0)    HX OF MIGRAINES   History of bronchitis    Hypertension    Hypothyroidism    takes levothyroxen   OSA on CPAP    wears cpap   Shortness of breath     with exertion   Wears glasses     Family History  Problem Relation Age of Onset   Cancer Mother        colon   Epilepsy Mother    Cancer Father        prostate   Diabetes Father    Hypertension Father    Hypertension Maternal Aunt    Diabetes Maternal Aunt    Hypertension Paternal Aunt     Past Surgical History:  Procedure Laterality Date   BACK SURGERY     CESAREAN SECTION     times 2  CHOLECYSTECTOMY     ENDOMETRIAL ABLATION     INCISION AND DRAINAGE HIP Left 11/10/2015   Procedure: IRRIGATION AND DEBRIDEMENT LEFT HIP INCISION;  Surgeon: Mcarthur Rossetti, MD;  Location: [facility];  Service: Orthopedics;  Laterality: Left;   JOINT REPLACEMENT  2011   total left knee   KNEE ARTHROPLASTY  04/23/2012   right    KNEE ARTHROSCOPY     LUMBAR LAMINECTOMY/DECOMPRESSION MICRODISCECTOMY N/A 06/23/2019   Procedure: RIGHT LUMBAR FIVE THROUGH SACRAL ONE PARTIAL HEMILAMINECTOMY WITH RIGHT LUMBAR FIVE FORAMINOTOMY;  Surgeon: Jessy Oto, MD;  Location: [facility];  Service: Orthopedics;  Laterality: N/A;   LUMBAR WOUND DEBRIDEMENT N/A 06/11/2018   Procedure: LUMBAR WOUND DEBRIDEMENT;  Surgeon: Jessy Oto, MD;  Location: [facility];  Service: Orthopedics;  Laterality: N/A;   RADIOLOGY WITH ANESTHESIA N/A 09/09/2018   Procedure: LUMBER SPINE WITHOUT CONTRAST;  Surgeon: Radiologist, Medication, MD;  Location: [facility];  Service: Radiology;  Laterality: N/A;   ROTATOR CUFF REPAIR     left    SHOULDER SURGERY     right to repair ligament tear   TOTAL HIP ARTHROPLASTY Left 09/09/2015   Procedure: LEFT TOTAL HIP ARTHROPLASTY ANTERIOR APPROACH;  Surgeon: Mcarthur Rossetti, MD;  Location: [facility];  Service: Orthopedics;  Laterality: Left;   TOTAL KNEE ARTHROPLASTY  04/23/2012   Procedure: TOTAL KNEE ARTHROPLASTY;  Surgeon: Newt Minion, MD;  Location: [facility];  Service: Orthopedics;  Laterality: Right;  Right Total Knee Arthroplasty   TUBAL LIGATION  1996   Social History   Occupational History    Not on file  Tobacco Use   Smoking status: Former    Packs/day: 0.50    Years: 4.00    Pack years: 2.00    Types: Cigarettes    Quit date: 09/18/1987    Years since quitting: 33.9   Smokeless tobacco: Never  Vaping Use   Vaping Use: Never used  Substance and Sexual Activity   Alcohol use: No   Drug use: No   Sexual activity: Yes    Birth control/protection: Surgical

## 2021-08-21 ENCOUNTER — Encounter: Payer: Self-pay | Admitting: Orthopedic Surgery

## 2021-08-21 ENCOUNTER — Ambulatory Visit (INDEPENDENT_AMBULATORY_CARE_PROVIDER_SITE_OTHER): Payer: Medicare Other | Admitting: Orthopedic Surgery

## 2021-08-21 ENCOUNTER — Other Ambulatory Visit: Payer: Self-pay

## 2021-08-21 DIAGNOSIS — G8929 Other chronic pain: Secondary | ICD-10-CM

## 2021-08-21 DIAGNOSIS — M19011 Primary osteoarthritis, right shoulder: Secondary | ICD-10-CM | POA: Diagnosis not present

## 2021-08-21 DIAGNOSIS — M19012 Primary osteoarthritis, left shoulder: Secondary | ICD-10-CM

## 2021-08-21 DIAGNOSIS — M25512 Pain in left shoulder: Secondary | ICD-10-CM | POA: Diagnosis not present

## 2021-08-21 NOTE — Progress Notes (Signed)
Office Visit Note   Patient: Marilyn [name]           Date of Birth: 1970/09/21           MRN: [id number] Visit Date: 08/21/2021 Requested by: Rebecka Apley, NP [address] [place],  [place] [address] PCP: Rebecka Apley, NP  Subjective: Chief Complaint  Patient presents with   Left Shoulder - Pain    HPI: Marilyn [name] is a 50 year old patient with bilateral shoulder pain left worse than right which has been going on for about 10 years.  She has a history of left shoulder arthroscopy x2.  Recent injection more than 2 months ago which gave her marginal relief.  She has her parents at home along with some children nearby.  The pain wakes her from sleep at night.  She cannot sleep well.  She has pain on a daily basis.  She has to sleep with her torso elevated and her arm propped up.  No history of diabetes but she does have history of hypertension.  She also has a history of bilateral knee replacements as well as a hip replacement which did have deep infection.  Currently no infection.  Back also has undergone surgery with subsequent superficial infection.  Patient also has morbid obesity.             ROS: All systems reviewed are negative as they relate to the chief complaint within the history of present illness.  Patient denies  fevers or chills.   Assessment & Plan: Visit Diagnoses:  1. Chronic left shoulder pain   2. Bilateral shoulder region arthritis     Plan: Impression is severe end-stage bilateral shoulder arthritis left worse than right.  Plan is for thin cut CT scan to assess glenoid vault bone volume and deformity.  I think she is not an ideal candidate for surgery but her disability from the shoulder arthritis is rather severe.  We discussed the risk and benefits of surgery including but not limited to infection nerve vessel damage dislocation as well as potential need for implant removal if infection occurs which in the face of diminished glenoid bone  stock could leave her with very few options for a functional shoulder to position her hand in space.  We will see her back after her thin cut CT scan.  Follow-Up Instructions: No follow-ups on file.   Orders:  Orders Placed This Encounter  Procedures   CT SHOULDER LEFT WO CONTRAST   No orders of the defined types were placed in this encounter.     Procedures: No procedures performed   Clinical Data: No additional findings.  Objective: Vital Signs: There were no vitals taken for this visit.  Physical Exam:   Constitutional: Patient appears well-developed HEENT:  Head: Normocephalic Eyes:EOM are normal Neck: Normal range of motion Cardiovascular: Normal rate Pulmonary/chest: Effort normal Neurologic: Patient is alert Skin: Skin is warm Psychiatric: Patient has normal mood and affect   Ortho Exam: Ortho exam demonstrates full active and passive range of motion of the cervical spine.  Left shoulder passive range of motion is 20/70/85.  Has diminished external rotation strength but subscap strength is intact.  Deltoid is functional.  Prior arthroscopy surgical incisions present.  Radial pulse intact.  EPL FPL interosseous intact on that left hand.  Patient has similar exam findings but with slightly more range of motion on the right-hand side but with preserved and improved posterior superior rotator cuff strength.  Specialty  Comments:  No specialty comments available.  Imaging: No results found.   PMFS History: Patient Active Problem List   Diagnosis Date Noted   Unilateral primary osteoarthritis, right hip 05/09/2021   S/P lumbar spinal fusion 05/09/2021   S/P insertion of spinal cord stimulator 05/09/2021   H/O total knee replacement, bilateral 01/18/2021   Lumbar post-laminectomy syndrome 04/06/2020   Long-term current use of opiate analgesic 11/04/2019   Leg pain, right 10/20/2019   Dysesthesia 10/20/2019   Lateral femoral cutaneous neuropathy, right  10/20/2019   Other spondylosis with radiculopathy, lumbar region    Status post lumbar laminectomy 06/23/2019   C. difficile colitis 05/22/2019   Hypophosphatemia 05/21/2019   Syncope 05/20/2019   OSA on CPAP    Acute gastroenteritis    Anemia due to blood loss, acute 06/18/2018    Class: Acute   Plantar fasciitis of left foot 06/16/2018    Class: Chronic   Tendonitis, Achilles, right 06/16/2018    Class: Chronic   Osteochondral talar dome lesion 06/16/2018    Class: Chronic   Deep incisional surgical site infection    Superficial incisional surgical site infection 06/11/2018    Class: Acute   Right ankle effusion 06/11/2018    Class: Acute   Morbid (severe) obesity due to excess calories ([place]) 05/29/2018    Class: Chronic   Spondylolisthesis, lumbar region 05/27/2018    Class: Chronic   Spinal stenosis of lumbar region 05/27/2018    Class: Chronic   Urinary tract infection 05/27/2018    Class: Acute   Fusion of spine of lumbar region 05/27/2018   Pain of right hip joint 07/03/2017   Acute right-sided low back pain with right-sided sciatica 03/27/2017   Chronic right shoulder pain 02/13/2017   History of rotator cuff tear 11/30/2016   Impingement syndrome of right shoulder 11/30/2016   Exacerbation of asthma 07/18/2016   Hypokalemia 07/18/2016   Hypertension 07/18/2016   Depression with anxiety 07/18/2016   Hypothyroidism 07/18/2016   Hyperglycemia 07/18/2016   Acute asthma exacerbation 07/18/2016   Surgical wound dehiscence left hip; questionable superficial infection 11/10/2015   Left hip postoperative wound infection 11/10/2015   Osteoarthritis of left hip 09/09/2015   Status post total replacement of left hip 09/09/2015   Obesity (BMI 35.0-39.9 without comorbidity) 04/28/2013   Past Medical History:  Diagnosis Date   Anemia    taking iron now. pt states having no current issues 09/02/2015   Anginal pain (HCC)    pt states experiences chest wall pain pt states  related to her asthma    Anxiety    with MRI's   Arthritis    Everywhere   Asthma    Depression    Dizziness    GERD (gastroesophageal reflux disease)    Headache(784.0)    HX OF MIGRAINES   History of bronchitis    Hypertension    Hypothyroidism    takes levothyroxen   OSA on CPAP    wears cpap   Shortness of breath    with exertion   Wears glasses     Family History  Problem Relation Age of Onset   Cancer Mother        colon   Epilepsy Mother    Cancer Father        prostate   Diabetes Father    Hypertension Father    Hypertension Maternal Aunt    Diabetes Maternal Aunt    Hypertension Paternal Aunt     Past Surgical History:  Procedure Laterality Date   BACK SURGERY     CESAREAN SECTION     times 2   CHOLECYSTECTOMY     ENDOMETRIAL ABLATION     INCISION AND DRAINAGE HIP Left 11/10/2015   Procedure: IRRIGATION AND DEBRIDEMENT LEFT HIP INCISION;  Surgeon: Mcarthur Rossetti, MD;  Location: [facility];  Service: Orthopedics;  Laterality: Left;   JOINT REPLACEMENT  2011   total left knee   KNEE ARTHROPLASTY  04/23/2012   right    KNEE ARTHROSCOPY     LUMBAR LAMINECTOMY/DECOMPRESSION MICRODISCECTOMY N/A 06/23/2019   Procedure: RIGHT LUMBAR FIVE THROUGH SACRAL ONE PARTIAL HEMILAMINECTOMY WITH RIGHT LUMBAR FIVE FORAMINOTOMY;  Surgeon: Jessy Oto, MD;  Location: [facility];  Service: Orthopedics;  Laterality: N/A;   LUMBAR WOUND DEBRIDEMENT N/A 06/11/2018   Procedure: LUMBAR WOUND DEBRIDEMENT;  Surgeon: Jessy Oto, MD;  Location: [facility];  Service: Orthopedics;  Laterality: N/A;   RADIOLOGY WITH ANESTHESIA N/A 09/09/2018   Procedure: LUMBER SPINE WITHOUT CONTRAST;  Surgeon: Radiologist, Medication, MD;  Location: [facility];  Service: Radiology;  Laterality: N/A;   ROTATOR CUFF REPAIR     left    SHOULDER SURGERY     right to repair ligament tear   TOTAL HIP ARTHROPLASTY Left 09/09/2015   Procedure: LEFT TOTAL HIP ARTHROPLASTY ANTERIOR APPROACH;  Surgeon: Mcarthur Rossetti, MD;  Location: [facility];  Service: Orthopedics;  Laterality: Left;   TOTAL KNEE ARTHROPLASTY  04/23/2012   Procedure: TOTAL KNEE ARTHROPLASTY;  Surgeon: Newt Minion, MD;  Location: [facility];  Service: Orthopedics;  Laterality: Right;  Right Total Knee Arthroplasty   TUBAL LIGATION  1996   Social History   Occupational History   Not on file  Tobacco Use   Smoking status: Former    Packs/day: 0.50    Years: 4.00    Pack years: 2.00    Types: Cigarettes    Quit date: 09/18/1987    Years since quitting: 33.9   Smokeless tobacco: Never  Vaping Use   Vaping Use: Never used  Substance and Sexual Activity   Alcohol use: No   Drug use: No   Sexual activity: Yes    Birth control/protection: Surgical

## 2021-08-24 ENCOUNTER — Telehealth: Payer: Self-pay | Admitting: Orthopaedic Surgery

## 2021-08-24 ENCOUNTER — Telehealth: Payer: Self-pay

## 2021-08-24 ENCOUNTER — Other Ambulatory Visit: Payer: Self-pay | Admitting: Orthopaedic Surgery

## 2021-08-24 MED ORDER — TRAMADOL HCL 50 MG PO TABS: 100.0000 mg | ORAL_TABLET | Freq: Four times a day (QID) | ORAL | 0 refills | Status: DC | PRN

## 2021-08-24 NOTE — Telephone Encounter (Signed)
Called and advised pt. She stated understanding  

## 2021-08-24 NOTE — Telephone Encounter (Signed)
[facility] pharmacy called wanting to know if Dr. Magnus Ivan wanted to fill Rx or hold off on Tramadol due to patient receiving Percocet 7.5-325mg  from another provider on 08/17/2021.  Cb# [phone].  Please advise.  Thank you.

## 2021-08-24 NOTE — Telephone Encounter (Signed)
Pt called requesting pain medication for shoulder pain. Please send to pharmacy on file. Pt phone number is [phone].

## 2021-08-24 NOTE — Telephone Encounter (Signed)
Please advise 

## 2021-08-24 NOTE — Telephone Encounter (Signed)
Called pharmacy and cancelled

## 2021-08-24 NOTE — Telephone Encounter (Signed)
I will call and advise

## 2021-09-14 ENCOUNTER — Ambulatory Visit
Admission: RE | Admit: 2021-09-14 | Discharge: 2021-09-14 | Disposition: A | Discharge status: Home / Self Care | Payer: Medicare Other | Source: Ambulatory Visit | Attending: Orthopedic Surgery | Admitting: Orthopedic Surgery

## 2021-09-14 ENCOUNTER — Other Ambulatory Visit: Payer: Self-pay

## 2021-09-14 DIAGNOSIS — M25512 Pain in left shoulder: Secondary | ICD-10-CM

## 2021-09-25 ENCOUNTER — Other Ambulatory Visit: Payer: Self-pay

## 2021-09-25 ENCOUNTER — Ambulatory Visit: Payer: Self-pay

## 2021-09-25 ENCOUNTER — Ambulatory Visit (INDEPENDENT_AMBULATORY_CARE_PROVIDER_SITE_OTHER): Payer: Medicare Other | Admitting: Surgical

## 2021-09-25 ENCOUNTER — Ambulatory Visit: Payer: Medicare Other | Admitting: Orthopedic Surgery

## 2021-09-25 ENCOUNTER — Encounter: Payer: Self-pay | Admitting: Surgical

## 2021-09-25 DIAGNOSIS — M25511 Pain in right shoulder: Secondary | ICD-10-CM | POA: Diagnosis not present

## 2021-09-25 DIAGNOSIS — M19012 Primary osteoarthritis, left shoulder: Secondary | ICD-10-CM

## 2021-09-25 DIAGNOSIS — M19011 Primary osteoarthritis, right shoulder: Secondary | ICD-10-CM

## 2021-09-25 NOTE — Progress Notes (Signed)
Office Visit Note   Patient: Marilyn [name]           Date of Birth: 02-19-1971           MRN: [id number] Visit Date: 09/25/2021 Requested by: Bridget Hartshorn, NP [place],  [place] [id number] PCP: Bridget Hartshorn, NP  Subjective: Chief Complaint  Patient presents with   Other    Scan review    HPI: Marilyn [name] is a 51 y.o. female who presents to the office complaining of bilateral shoulder pain.  She is here today to review CT scan of the left shoulder that was obtained for evaluation of glenoid bone stock and deformity.  She continues to complain of severe bilateral shoulder pain, left greater than right.  Right shoulder is catching up with the left shoulder.  She does not work and is disabled.  She has difficulty with her ADLs and that is the main thing that she would like to be able to do is just perform daily activities without pain.  She has no history of frank dislocation but she feels subluxation at times.  She has had 2 left shoulder surgeries and 2 right shoulder surgeries that were about 10 to 20 years ago by Dr. Sharol Given and she cannot recall but feels that rotator cuff was involved in the surgeries.  She has also had multiple joints replaced with her hip replacement becoming infected and requiring I&D without removal of implants.  She has done well following that.  She also had a back surgery by Dr. Louanne Skye that became infected and required wound VAC to be placed.  She eventually recovered from that as well.  No history of diabetes or smoking but she does have morbid obesity..                ROS: All systems reviewed are negative as they relate to the chief complaint within the history of present illness.  Patient denies fevers or chills.  Assessment & Plan: Visit Diagnoses:  1. Right shoulder pain, unspecified chronicity     Plan: Patient is a 51 year old female who presents for evaluation of bilateral shoulder pain and review of left  shoulder CT scan.  Left shoulder CT scan shows severe osteoarthritis of the glenohumeral joint with smaller glenoid face measuring 3.6 cm x 2.6 cm with reduced glenoid bone stock.  Does not seem like there is too much wear to preclude surgery as an option but it does make it more risky and with less margin for error which I discussed with Marilyn [name] today.  Discussed the risks and benefits of the procedure including the risk of shoulder stiffness, shoulder instability, nerve/vessel damage, intraoperative fracture, periprosthetic joint infection, need for revision surgery, possibility of ending up with a flail shoulder later on down the road if multiple revision surgeries are needed.  Discussed the surgery and recovery process.  After lengthy discussion, patient would like to proceed with scheduling surgery.  Plan to discuss patient with Dr. Marlou Sa once he is back in the office and then post her for left shoulder replacement likely in early February.  She understands that she is at a higher risk for surgical complication.  Anticipate the use of a Prevena wound VAC to be applied in the OR.  There does appear to be some narrowing of acromiohumeral interval on the left shoulder CT and with her history of multiple shoulder procedures involving the rotator cuff, could consider reverse shoulder arthroplasty or  convertible baseplate for an anatomic replacement.  Follow-Up Instructions: No follow-ups on file.   Orders:  Orders Placed This Encounter  Procedures   XR Shoulder Right   No orders of the defined types were placed in this encounter.     Procedures: No procedures performed   Clinical Data: No additional findings.  Objective: Vital Signs: There were no vitals taken for this visit.  Physical Exam:  Constitutional: Patient appears well-developed HEENT:  Head: Normocephalic Eyes:EOM are normal Neck: Normal range of motion Cardiovascular: Normal rate Pulmonary/chest: Effort normal Neurologic:  Patient is alert Skin: Skin is warm Psychiatric: Patient has normal mood and affect  Ortho Exam: Ortho exam demonstrates left shoulder with 30 degrees external rotation, 60 degrees abduction, 125 degrees forward flexion.  This compared with the right shoulder with 35 degrees external rotation, 70 degrees abduction, 145 degrees forward flexion.  Axillary nerve intact bilaterally with deltoid firing.  5/5 motor strength of bilateral supraspinatus, infraspinatus, subscapularis.  5/5 motor strength of bilateral grip strength, finger abduction, pronation/supination, bicep, tricep, deltoid.  Specialty Comments:  No specialty comments available.  Imaging: No results found.   PMFS History: Patient Active Problem List   Diagnosis Date Noted   Unilateral primary osteoarthritis, right hip 05/09/2021   S/P lumbar spinal fusion 05/09/2021   S/P insertion of spinal cord stimulator 05/09/2021   H/O total knee replacement, bilateral 01/18/2021   Lumbar post-laminectomy syndrome 04/06/2020   Long-term current use of opiate analgesic 11/04/2019   Leg pain, right 10/20/2019   Dysesthesia 10/20/2019   Lateral femoral cutaneous neuropathy, 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 difficile colitis 05/22/2019   Hypophosphatemia 05/21/2019   Syncope 05/20/2019   OSA on CPAP    Acute gastroenteritis    Anemia due to blood loss, acute 06/18/2018    Class: Acute   Plantar fasciitis of left foot 06/16/2018    Class: Chronic   Tendonitis, Achilles, right 06/16/2018    Class: Chronic   Osteochondral talar dome lesion 06/16/2018    Class: Chronic   Deep incisional surgical site infection    Superficial incisional surgical site infection 06/11/2018    Class: Acute   Right ankle effusion 06/11/2018    Class: Acute   Morbid (severe) obesity due to excess calories ([place]) 05/29/2018    Class: Chronic   Spondylolisthesis,  lumbar region 05/27/2018    Class: Chronic   Spinal stenosis of lumbar region 05/27/2018    Class: Chronic   Urinary tract infection 05/27/2018    Class: Acute   Fusion of spine of lumbar region 05/27/2018   Pain of right hip joint 07/03/2017   Acute right-sided low back pain with right-sided sciatica 03/27/2017   Chronic right shoulder pain 02/13/2017   History of rotator cuff tear 11/30/2016   Impingement syndrome of right shoulder 11/30/2016   Exacerbation of asthma 07/18/2016   Hypokalemia 07/18/2016   Hypertension 07/18/2016   Depression with anxiety 07/18/2016   Hypothyroidism 07/18/2016   Hyperglycemia 07/18/2016   Acute asthma exacerbation 07/18/2016   Surgical wound dehiscence left hip; questionable superficial infection 11/10/2015   Left hip postoperative wound infection 11/10/2015   Osteoarthritis of left hip 09/09/2015   Status post total replacement of left hip 09/09/2015   Obesity (BMI 35.0-39.9 without comorbidity) 04/28/2013   Past Medical History:  Diagnosis Date   Anemia    taking iron now. pt states having no current issues 09/02/2015  Anginal pain ([place])    pt states experiences chest wall pain pt states related to her asthma    Anxiety    with MRI's   Arthritis    Everywhere   Asthma    Depression    Dizziness    GERD (gastroesophageal reflux disease)    Headache(784.0)    HX OF MIGRAINES   History of bronchitis    Hypertension    Hypothyroidism    takes levothyroxen   OSA on CPAP    wears cpap   Shortness of breath    with exertion   Wears glasses     Family History  Problem Relation Age of Onset   Cancer Mother        colon   Epilepsy Mother    Cancer Father        prostate   Diabetes Father    Hypertension Father    Hypertension Maternal Aunt    Diabetes Maternal Aunt    Hypertension Paternal Aunt     Past Surgical History:  Procedure Laterality Date   BACK SURGERY     CESAREAN SECTION     times 2   CHOLECYSTECTOMY      ENDOMETRIAL ABLATION     INCISION AND DRAINAGE HIP Left 11/10/2015   Procedure: IRRIGATION AND DEBRIDEMENT LEFT HIP INCISION;  Surgeon: Mcarthur Rossetti, MD;  Location: [facility];  Service: Orthopedics;  Laterality: Left;   JOINT REPLACEMENT  2011   total left knee   KNEE ARTHROPLASTY  04/23/2012   right    KNEE ARTHROSCOPY     LUMBAR LAMINECTOMY/DECOMPRESSION MICRODISCECTOMY N/A 06/23/2019   Procedure: RIGHT LUMBAR FIVE THROUGH SACRAL ONE PARTIAL HEMILAMINECTOMY WITH RIGHT LUMBAR FIVE FORAMINOTOMY;  Surgeon: Jessy Oto, MD;  Location: [facility];  Service: Orthopedics;  Laterality: N/A;   LUMBAR WOUND DEBRIDEMENT N/A 06/11/2018   Procedure: LUMBAR WOUND DEBRIDEMENT;  Surgeon: Jessy Oto, MD;  Location: [facility];  Service: Orthopedics;  Laterality: N/A;   RADIOLOGY WITH ANESTHESIA N/A 09/09/2018   Procedure: LUMBER SPINE WITHOUT CONTRAST;  Surgeon: Radiologist, Medication, MD;  Location: [facility];  Service: Radiology;  Laterality: N/A;   ROTATOR CUFF REPAIR     left    SHOULDER SURGERY     right to repair ligament tear   TOTAL HIP ARTHROPLASTY Left 09/09/2015   Procedure: LEFT TOTAL HIP ARTHROPLASTY ANTERIOR APPROACH;  Surgeon: Mcarthur Rossetti, MD;  Location: [facility];  Service: Orthopedics;  Laterality: Left;   TOTAL KNEE ARTHROPLASTY  04/23/2012   Procedure: TOTAL KNEE ARTHROPLASTY;  Surgeon: Newt Minion, MD;  Location: [facility];  Service: Orthopedics;  Laterality: Right;  Right Total Knee Arthroplasty   TUBAL LIGATION  1996   Social History   Occupational History   Not on file  Tobacco Use   Smoking status: Former    Packs/day: 0.50    Years: 4.00    Pack years: 2.00    Types: Cigarettes    Quit date: 09/18/1987    Years since quitting: 34.0   Smokeless tobacco: Never  Vaping Use   Vaping Use: Never used  Substance and Sexual Activity   Alcohol use: No   Drug use: No   Sexual activity: Yes    Birth control/protection: Surgical

## 2021-10-03 ENCOUNTER — Telehealth: Payer: Self-pay | Admitting: Orthopedic Surgery

## 2021-10-03 NOTE — Telephone Encounter (Signed)
Please advise 

## 2021-10-03 NOTE — Telephone Encounter (Signed)
Here is the patient we spoke about the other day

## 2021-10-03 NOTE — Telephone Encounter (Signed)
Pt calling stating she saw Franky Macho at previous appt on 09/25/21 and was told she would be in contact with someone after he verified if they believed surg was the next option for her but she has not heard back yet. The best call back number is [phone].

## 2021-10-04 ENCOUNTER — Ambulatory Visit (INDEPENDENT_AMBULATORY_CARE_PROVIDER_SITE_OTHER): Payer: Commercial Managed Care - HMO | Admitting: Orthopaedic Surgery

## 2021-10-04 ENCOUNTER — Other Ambulatory Visit: Payer: Self-pay

## 2021-10-04 VITALS — Ht 63.5 in | Wt 371.2 lb

## 2021-10-04 DIAGNOSIS — Z6841 Body Mass Index (BMI) 40.0 and over, adult: Secondary | ICD-10-CM

## 2021-10-04 DIAGNOSIS — M25551 Pain in right hip: Secondary | ICD-10-CM | POA: Diagnosis not present

## 2021-10-04 NOTE — Progress Notes (Signed)
The patient is well-known to [place].  She has remote history of a left total hip arthroplasty that was done through an anterior approach.  She did have a postoperative complication with a soft tissue breakdown but overall that hip is done well over time.  She is someone who is morbidly obese and over time is putting even more weight on.  Her BMI today is 64.72.  She does ambit with a cane.  She has been dealing with right hip pain.  She did have an intra-articular steroid injection of the right hip under fluoroscopy by Dr. Alvester Morin back in August.  She is not sure if that really helped.  She does feel like the hip will give out on her.  I can easily put her right hip through internal or external rotation with no blocks to rotation.  She says that is somewhat painful to her.  At this point I do feel a CT scan is warranted of her right hip to assess the joint space.  It is too difficult to evaluate plain films due to her soft tissue envelope but I do think a CT scan would be appropriate to rule out any type of worsening arthritic changes.  We will see her back after that CT scan and can come up with a treatment plan after that.  She understands that what is affecting her most is her morbid obesity.

## 2021-10-04 NOTE — Addendum Note (Signed)
Addended by: Rogers Seeds on: 10/04/2021 04:28 PM   Modules accepted: Orders

## 2021-10-06 ENCOUNTER — Other Ambulatory Visit: Payer: Medicare Other

## 2021-10-09 NOTE — Telephone Encounter (Signed)
Posted.  I did discuss with her and her prior visits the significant amount of risk for her with her history of infection.  Risk including but not limited to infection nerve vessel damage instability as well as potential for flail arm if we have to remove the implants.  Her plan would definitely be as with all shoulder replacements benzyl peroxide prior to surgery.  I would also consider incisional wound VAC for her after surgery for the first 5 days.

## 2021-10-18 ENCOUNTER — Ambulatory Visit: Payer: Commercial Managed Care - HMO | Admitting: Orthopaedic Surgery

## 2021-10-19 ENCOUNTER — Other Ambulatory Visit: Payer: Self-pay

## 2021-10-24 ENCOUNTER — Encounter: Payer: Self-pay | Admitting: Orthopedic Surgery

## 2021-10-26 ENCOUNTER — Inpatient Hospital Stay: Admission: RE | Admit: 2021-10-26 | Payer: Medicare Other | Source: Ambulatory Visit

## 2021-10-29 ENCOUNTER — Other Ambulatory Visit (INDEPENDENT_AMBULATORY_CARE_PROVIDER_SITE_OTHER): Payer: Self-pay | Admitting: Specialist

## 2021-10-30 ENCOUNTER — Ambulatory Visit: Payer: Commercial Managed Care - HMO | Admitting: Orthopaedic Surgery

## 2021-10-31 ENCOUNTER — Ambulatory Visit
Admission: RE | Admit: 2021-10-31 | Discharge: 2021-10-31 | Disposition: A | Discharge status: Home / Self Care | Payer: Medicare Other | Source: Ambulatory Visit | Attending: Orthopaedic Surgery | Admitting: Orthopaedic Surgery

## 2021-10-31 ENCOUNTER — Other Ambulatory Visit: Payer: Self-pay

## 2021-10-31 DIAGNOSIS — Z6841 Body Mass Index (BMI) 40.0 and over, adult: Secondary | ICD-10-CM

## 2021-10-31 DIAGNOSIS — M25551 Pain in right hip: Secondary | ICD-10-CM

## 2021-10-31 MED ORDER — IOPAMIDOL (ISOVUE-M 200) INJECTION 41%
15.0000 mL | Freq: Once | INTRAMUSCULAR | Status: AC
  Administered 2021-10-31: 15 mL via INTRA_ARTICULAR

## 2021-11-02 ENCOUNTER — Ambulatory Visit (INDEPENDENT_AMBULATORY_CARE_PROVIDER_SITE_OTHER): Payer: Medicaid Other | Admitting: Orthopaedic Surgery

## 2021-11-02 ENCOUNTER — Other Ambulatory Visit: Payer: Self-pay

## 2021-11-02 DIAGNOSIS — M25551 Pain in right hip: Secondary | ICD-10-CM | POA: Diagnosis not present

## 2021-11-02 DIAGNOSIS — Z6841 Body Mass Index (BMI) 40.0 and over, adult: Secondary | ICD-10-CM | POA: Diagnosis not present

## 2021-11-02 DIAGNOSIS — M1611 Unilateral primary osteoarthritis, right hip: Secondary | ICD-10-CM

## 2021-11-02 NOTE — Progress Notes (Signed)
The patient comes in today to go over CT scan of her right hip.  I had difficulty seeing the hip on her plain films due to her body habitus.  Her BMI is over 60.  She does have a history of a left hip that replaced years ago that did have a postop complication of soft tissue breakdown but then she eventually healed that.  I did go over the CT scan of her right hip.  She does have moderate arthritis of the right hip.  She has a large soft tissue envelope around her hip and I let her know that I am not comfortable with proceeding with any surgery on her hip given her BMI being so high and her soft tissue envelope being so high around the hip.  This is concerning and her complication rate would be incredibly high and I do not feel comfortable getting to her hip at all at this standpoint.  The only thing I can recommend is weight loss and activity modification.

## 2021-11-10 NOTE — Pre-Procedure Instructions (Signed)
Surgical Instructions    Your procedure is scheduled on Thursday, March 2nd.  Report to [facility] Main Entrance "A" at 10:15 A.M., then check in with the Admitting office.  Call this number if you have problems the morning of surgery:  [phone]   If you have any questions prior to your surgery date call [phone]: Open Monday-Friday 8am-4pm    Remember:  Do not eat after midnight the night before your surgery  You may drink clear liquids until 09:15 AM the morning of your surgery.   Clear liquids allowed are: Water, Non-Citrus Juices (without pulp), Carbonated Beverages, Clear Tea, Black Coffee Only (NO MILK, CREAM OR POWDERED CREAMER of any kind), and Gatorade.  Patient Instructions  The night before surgery:  No food after midnight. ONLY clear liquids after midnight  The day of surgery (if you do NOT have diabetes):  Drink ONE (1) Pre-Surgery Clear Ensure by 09:15 AM the morning of surgery. Drink in one sitting. Do not sip.  This drink was given to you during your hospital  pre-op appointment visit.  Nothing else to drink after completing the  Pre-Surgery Clear Ensure.          If you have questions, please contact your surgeons office.       Take these medicines the morning of surgery with A SIP OF WATER  allopurinol (ZYLOPRIM) buPROPion (WELLBUTRIN XL)  cetirizine (ZYRTEC)  DULoxetine (CYMBALTA) gabapentin (NEURONTIN) levothyroxine (SYNTHROID) oxybutynin (DITROPAN XL)    If needed: albuterol (VENTOLIN HFA)- if needed, bring with you on the day of surgery cyclobenzaprine (FLEXERIL) cycloSPORINE (RESTASIS) eye drops oxyCODONE-acetaminophen (PERCOCET)    As of today, STOP taking any Aspirin (unless otherwise instructed by your surgeon) Aleve, Naproxen, Ibuprofen, Motrin, Advil, Goody's, BC's, all herbal medications, fish oil, and all vitamins.                     Do NOT Smoke (Tobacco/Vaping) for 24 hours prior to your procedure.  If you use a CPAP  at night, you may bring your mask/headgear for your overnight stay.   Contacts, glasses, piercing's, hearing aid's, dentures or partials may not be worn into surgery, please bring cases for these belongings.    For patients admitted to the hospital, discharge time will be determined by your treatment team.   Patients discharged the day of surgery will not be allowed to drive home, and someone needs to stay with them for 24 hours.  NO VISITORS WILL BE ALLOWED IN PRE-OP WHERE PATIENTS ARE PREPPED FOR SURGERY.  ONLY 1 SUPPORT PERSON MAY BE PRESENT IN THE WAITING ROOM WHILE YOU ARE IN SURGERY.  IF YOU ARE TO BE ADMITTED, ONCE YOU ARE IN YOUR ROOM YOU WILL BE ALLOWED TWO (2) VISITORS. (1) VISITOR MAY STAY OVERNIGHT BUT MUST ARRIVE TO THE ROOM BY 8pm.  Minor children may have two parents present. Special consideration for safety and communication needs will be reviewed on a case by case basis.   Special instructions:   [facility]- Preparing For Surgery  Before surgery, you can play an important role. Because skin is not sterile, your skin needs to be as free of germs as possible. You can reduce the number of germs on your skin by washing with CHG (chlorahexidine gluconate) Soap before surgery.  CHG is an antiseptic cleaner which kills germs and bonds with the skin to continue killing germs even after washing.    Oral Hygiene is also important to reduce your risk of infection.  Remember - BRUSH YOUR TEETH THE MORNING OF SURGERY WITH YOUR REGULAR TOOTHPASTE  Please do not use if you have an allergy to CHG or antibacterial soaps. If your skin becomes reddened/irritated stop using the CHG.  Do not shave (including legs and underarms) for at least 48 hours prior to first CHG shower. It is OK to shave your face.  Please follow these instructions carefully.   Shower the NIGHT BEFORE SURGERY and the MORNING OF SURGERY  If you chose to wash your hair, wash your hair first as usual with your normal  shampoo.  After you shampoo, rinse your hair and body thoroughly to remove the shampoo.  Use CHG Soap as you would any other liquid soap. You can apply CHG directly to the skin and wash gently with a scrungie or a clean washcloth.   Apply the CHG Soap to your body ONLY FROM THE NECK DOWN.  Do not use on open wounds or open sores. Avoid contact with your eyes, ears, mouth and genitals (private parts). Wash Face and genitals (private parts)  with your normal soap.   Wash thoroughly, paying special attention to the area where your surgery will be performed.  Thoroughly rinse your body with warm water from the neck down.  DO NOT shower/wash with your normal soap after using and rinsing off the CHG Soap.  Pat yourself dry with a CLEAN TOWEL.  Wear CLEAN PAJAMAS to bed the night before surgery  Place CLEAN SHEETS on your bed the night before your surgery  DO NOT SLEEP WITH PETS.   Day of Surgery: Shower with CHG soap. Do not wear jewelry, make up, nail polish, gel polish, artificial nails, or any other type of covering on natural nails including finger and toenails. If patients have artificial nails, gel coating, etc. that need to be removed by a nail salon please have this removed prior to surgery. Surgery may need to be canceled/delayed if the surgeon/anesthesiologist feels like the patient is unable to be adequately monitored. Do not wear lotions, powders, perfumes, or deodorant. Do not shave 48 hours prior to surgery.   Do not bring valuables to the hospital. [facility] is not responsible for any belongings or valuables. Wear Clean/Comfortable clothing the morning of surgery Remember to brush your teeth WITH YOUR REGULAR TOOTHPASTE.   Please read over the following fact sheets that you were given.   3 days prior to your procedure or After your COVID test   You are not required to quarantine however you are required to wear a well-fitting mask when you are out and around people  not in your household. If your mask becomes wet or soiled, replace with a new one.   Wash your hands often with soap and water for 20 seconds or clean your hands with an alcohol-based hand sanitizer that contains at least 60% alcohol.   Do not share personal items.   Notify your provider:  o if you are in close contact with someone who has COVID  o or if you develop a fever of 100.4 or greater, sneezing, cough, sore throat, shortness of breath or body aches.

## 2021-11-13 ENCOUNTER — Other Ambulatory Visit: Payer: Self-pay

## 2021-11-13 ENCOUNTER — Encounter (HOSPITAL_COMMUNITY): Payer: Self-pay

## 2021-11-13 ENCOUNTER — Encounter (HOSPITAL_COMMUNITY)
Admission: RE | Admit: 2021-11-13 | Discharge: 2021-11-13 | Disposition: A | Discharge status: Home / Self Care | Payer: Medicare Other | Source: Ambulatory Visit | Attending: Orthopedic Surgery | Admitting: Orthopedic Surgery

## 2021-11-13 VITALS — BP 156/79 | HR 86 | Temp 98.2°F | Resp 18 | Ht 63.0 in | Wt 370.6 lb

## 2021-11-13 DIAGNOSIS — Z01812 Encounter for preprocedural laboratory examination: Secondary | ICD-10-CM | POA: Insufficient documentation

## 2021-11-13 DIAGNOSIS — Z20822 Contact with and (suspected) exposure to covid-19: Secondary | ICD-10-CM | POA: Diagnosis not present

## 2021-11-13 DIAGNOSIS — Z01818 Encounter for other preprocedural examination: Secondary | ICD-10-CM

## 2021-11-13 LAB — URINALYSIS, ROUTINE W REFLEX MICROSCOPIC
Bilirubin Urine: NEGATIVE
Glucose, UA: NEGATIVE mg/dL
Hgb urine dipstick: NEGATIVE
Ketones, ur: NEGATIVE mg/dL
Nitrite: POSITIVE — AB
Protein, ur: NEGATIVE mg/dL
Specific Gravity, Urine: 1.02 (ref 1.005–1.030)
pH: 5.5 (ref 5.0–8.0)

## 2021-11-13 LAB — CBC
HCT: 40 % (ref 36.0–46.0)
Hemoglobin: 12.2 g/dL (ref 12.0–15.0)
MCH: 28 pg (ref 26.0–34.0)
MCHC: 30.5 g/dL (ref 30.0–36.0)
MCV: 92 fL (ref 80.0–100.0)
Platelets: 320 10*3/uL (ref 150–400)
RBC: 4.35 MIL/uL (ref 3.87–5.11)
RDW: 14.6 % (ref 11.5–15.5)
WBC: 7.1 10*3/uL (ref 4.0–10.5)
nRBC: 0 % (ref 0.0–0.2)

## 2021-11-13 LAB — URINALYSIS, MICROSCOPIC (REFLEX)

## 2021-11-13 LAB — BASIC METABOLIC PANEL
Anion gap: 13 (ref 5–15)
BUN: 14 mg/dL (ref 6–20)
CO2: 27 mmol/L (ref 22–32)
Calcium: 9.7 mg/dL (ref 8.9–10.3)
Chloride: 99 mmol/L (ref 98–111)
Creatinine, Ser: 1.42 mg/dL — ABNORMAL HIGH (ref 0.44–1.00)
GFR, Estimated: 45 mL/min — ABNORMAL LOW (ref >60)
Glucose, Bld: 94 mg/dL (ref 70–99)
Potassium: 3.5 mmol/L (ref 3.5–5.1)
Sodium: 139 mmol/L (ref 135–145)

## 2021-11-13 LAB — SARS CORONAVIRUS 2 BY RT PCR (HOSPITAL ORDER, PERFORMED IN ~~LOC~~ HOSPITAL LAB): SARS Coronavirus 2: NEGATIVE

## 2021-11-13 LAB — SURGICAL PCR SCREEN
MRSA, PCR: NEGATIVE
Staphylococcus aureus: NEGATIVE

## 2021-11-13 NOTE — Progress Notes (Signed)
PCP - Sharon Seller NP Cardiologist - Denies  Chest x-ray - Not indicated EKG - 11/13/21 Stress Test - "Long time ago" Does not recall any cardiology f/u ECHO - 05/21/19 Cardiac Cath -Denies   Sleep Study - Yes has OSA CPAP - nightly  DM - Denies  ERAS Protcol - Yes PRE-SURGERY Ensure given  COVID TEST- 11/13/21   Anesthesia review: No  Patient denies shortness of breath, fever, cough and chest pain at PAT appointment   All instructions explained to the patient, with a verbal understanding of the material. Patient agrees to go over the instructions while at home for a better understanding. Patient also instructed to wear a mask in public after being tested for COVID-19. The opportunity to ask questions was provided.

## 2021-11-13 NOTE — Progress Notes (Signed)
Patient urine sample was not enough for UA and UC.  UA sent and will need UC on day of surgery.

## 2021-11-14 ENCOUNTER — Other Ambulatory Visit: Payer: Self-pay | Admitting: Surgical

## 2021-11-14 MED ORDER — CIPROFLOXACIN HCL 500 MG PO TABS: 500.0000 mg | ORAL_TABLET | Freq: Two times a day (BID) | ORAL | 0 refills | Status: DC

## 2021-11-14 NOTE — Progress Notes (Signed)
Sent in RX for Cipro. Called patient to inform her

## 2021-11-14 NOTE — Progress Notes (Signed)
Called Marilyn [name] at Dr. Diamantina [facility] office and left message with UA results - showed "many bacteria", positive Nitrite and small Leukocytes.

## 2021-11-16 ENCOUNTER — Ambulatory Visit (HOSPITAL_COMMUNITY): Payer: Medicare Other | Admitting: Anesthesiology

## 2021-11-16 ENCOUNTER — Ambulatory Visit (HOSPITAL_BASED_OUTPATIENT_CLINIC_OR_DEPARTMENT_OTHER): Payer: Medicare Other | Admitting: Anesthesiology

## 2021-11-16 ENCOUNTER — Observation Stay (HOSPITAL_COMMUNITY): Payer: Medicare Other

## 2021-11-16 ENCOUNTER — Encounter (HOSPITAL_COMMUNITY): Payer: Self-pay | Admitting: Orthopedic Surgery

## 2021-11-16 ENCOUNTER — Other Ambulatory Visit: Payer: Self-pay

## 2021-11-16 ENCOUNTER — Encounter (HOSPITAL_COMMUNITY)
Admission: RE | Disposition: A | Discharge status: Home / Self Care | Payer: Self-pay | Source: Home / Self Care | Attending: Orthopedic Surgery

## 2021-11-16 ENCOUNTER — Observation Stay (HOSPITAL_COMMUNITY)
Admission: RE | Admit: 2021-11-16 | Discharge: 2021-11-18 | Disposition: A | Discharge status: Home / Self Care | Payer: Medicare Other | Attending: Orthopedic Surgery | Admitting: Orthopedic Surgery

## 2021-11-16 DIAGNOSIS — D638 Anemia in other chronic diseases classified elsewhere: Secondary | ICD-10-CM

## 2021-11-16 DIAGNOSIS — Z96612 Presence of left artificial shoulder joint: Secondary | ICD-10-CM

## 2021-11-16 DIAGNOSIS — Z91040 Latex allergy status: Secondary | ICD-10-CM | POA: Insufficient documentation

## 2021-11-16 DIAGNOSIS — E039 Hypothyroidism, unspecified: Secondary | ICD-10-CM

## 2021-11-16 DIAGNOSIS — Z79899 Other long term (current) drug therapy: Secondary | ICD-10-CM | POA: Insufficient documentation

## 2021-11-16 DIAGNOSIS — Z96642 Presence of left artificial hip joint: Secondary | ICD-10-CM | POA: Insufficient documentation

## 2021-11-16 DIAGNOSIS — Z87891 Personal history of nicotine dependence: Secondary | ICD-10-CM | POA: Insufficient documentation

## 2021-11-16 DIAGNOSIS — Z96653 Presence of artificial knee joint, bilateral: Secondary | ICD-10-CM | POA: Diagnosis not present

## 2021-11-16 DIAGNOSIS — Z9889 Other specified postprocedural states: Secondary | ICD-10-CM

## 2021-11-16 DIAGNOSIS — Z01818 Encounter for other preprocedural examination: Secondary | ICD-10-CM

## 2021-11-16 DIAGNOSIS — I10 Essential (primary) hypertension: Secondary | ICD-10-CM

## 2021-11-16 DIAGNOSIS — M19012 Primary osteoarthritis, left shoulder: Secondary | ICD-10-CM | POA: Diagnosis present

## 2021-11-16 DIAGNOSIS — J45909 Unspecified asthma, uncomplicated: Secondary | ICD-10-CM | POA: Diagnosis not present

## 2021-11-16 HISTORY — PX: APPLICATION OF WOUND VAC: SHX5189

## 2021-11-16 HISTORY — PX: REVERSE SHOULDER ARTHROPLASTY: SHX5054

## 2021-11-16 LAB — POCT PREGNANCY, URINE: Preg Test, Ur: NEGATIVE

## 2021-11-16 SURGERY — ARTHROPLASTY, SHOULDER, TOTAL, REVERSE
Anesthesia: General | Site: Shoulder | Laterality: Left

## 2021-11-16 MED ORDER — MIDAZOLAM HCL 2 MG/2ML IJ SOLN: 2.0000 mg | Freq: Once | INTRAMUSCULAR | Status: AC

## 2021-11-16 MED ORDER — ONDANSETRON HCL 4 MG PO TABS
4.0000 mg | ORAL_TABLET | Freq: Four times a day (QID) | ORAL | Status: DC | PRN
Start: 2021-11-16 — End: 2021-11-18

## 2021-11-16 MED ORDER — VANCOMYCIN HCL 1000 MG IV SOLR
INTRAVENOUS | Status: DC | PRN
  Administered 2021-11-16 (×2): 1000 mg via TOPICAL

## 2021-11-16 MED ORDER — VANCOMYCIN HCL 1000 MG IV SOLR
INTRAVENOUS | Status: AC
  Filled 2021-11-16: qty 20

## 2021-11-16 MED ORDER — DOCUSATE SODIUM 100 MG PO CAPS
100.0000 mg | ORAL_CAPSULE | Freq: Two times a day (BID) | ORAL | Status: DC
  Administered 2021-11-16 – 2021-11-18 (×4): 100 mg via ORAL
  Filled 2021-11-16 (×4): qty 1

## 2021-11-16 MED ORDER — VASOPRESSIN 20 UNIT/ML IV SOLN
INTRAVENOUS | Status: AC
  Filled 2021-11-16: qty 1

## 2021-11-16 MED ORDER — METOCLOPRAMIDE HCL 5 MG/ML IJ SOLN: 5.0000 mg | Freq: Three times a day (TID) | INTRAMUSCULAR | Status: DC | PRN

## 2021-11-16 MED ORDER — DEXAMETHASONE SODIUM PHOSPHATE 10 MG/ML IJ SOLN
INTRAMUSCULAR | Status: DC | PRN
  Administered 2021-11-16: 10 mg via INTRAVENOUS

## 2021-11-16 MED ORDER — FENTANYL CITRATE (PF) 100 MCG/2ML IJ SOLN
INTRAMUSCULAR | Status: AC
  Administered 2021-11-16: 100 ug via INTRAVENOUS
  Filled 2021-11-16: qty 2

## 2021-11-16 MED ORDER — METHOCARBAMOL 1000 MG/10ML IJ SOLN
500.0000 mg | Freq: Four times a day (QID) | INTRAVENOUS | Status: DC | PRN
  Filled 2021-11-16: qty 5

## 2021-11-16 MED ORDER — METOCLOPRAMIDE HCL 5 MG PO TABS: 5.0000 mg | ORAL_TABLET | Freq: Three times a day (TID) | ORAL | Status: DC | PRN

## 2021-11-16 MED ORDER — HYDROCHLOROTHIAZIDE 25 MG PO TABS
25.0000 mg | ORAL_TABLET | Freq: Every day | ORAL | Status: DC
  Administered 2021-11-17 – 2021-11-18 (×2): 25 mg via ORAL
  Filled 2021-11-16 (×2): qty 1

## 2021-11-16 MED ORDER — PROPOFOL 10 MG/ML IV BOLUS
INTRAVENOUS | Status: DC | PRN
  Administered 2021-11-16: 200 mg via INTRAVENOUS

## 2021-11-16 MED ORDER — POVIDONE-IODINE 7.5 % EX SOLN
Freq: Once | CUTANEOUS | Status: DC
Start: 2021-11-16 — End: 2021-11-16
  Filled 2021-11-16: qty 118

## 2021-11-16 MED ORDER — PHENOL 1.4 % MT LIQD: 1.0000 | OROMUCOSAL | Status: DC | PRN

## 2021-11-16 MED ORDER — HYDROMORPHONE HCL 1 MG/ML IJ SOLN
INTRAMUSCULAR | Status: AC
  Filled 2021-11-16: qty 1

## 2021-11-16 MED ORDER — SUGAMMADEX SODIUM 500 MG/5ML IV SOLN
INTRAVENOUS | Status: AC
  Filled 2021-11-16: qty 5

## 2021-11-16 MED ORDER — CEFAZOLIN SODIUM-DEXTROSE 2-4 GM/100ML-% IV SOLN
2.0000 g | Freq: Three times a day (TID) | INTRAVENOUS | Status: AC
  Administered 2021-11-17 (×2): 2 g via INTRAVENOUS
  Filled 2021-11-16 (×3): qty 100

## 2021-11-16 MED ORDER — PROPOFOL 500 MG/50ML IV EMUL
INTRAVENOUS | Status: DC | PRN
  Administered 2021-11-16: 25 ug/kg/min via INTRAVENOUS

## 2021-11-16 MED ORDER — CHLORHEXIDINE GLUCONATE 0.12 % MT SOLN
15.0000 mL | Freq: Once | OROMUCOSAL | Status: AC
  Administered 2021-11-16: 15 mL via OROMUCOSAL
  Filled 2021-11-16: qty 15

## 2021-11-16 MED ORDER — ACETAMINOPHEN 500 MG PO TABS
1000.0000 mg | ORAL_TABLET | Freq: Four times a day (QID) | ORAL | Status: AC
  Administered 2021-11-17 (×4): 1000 mg via ORAL
  Filled 2021-11-16 (×4): qty 2

## 2021-11-16 MED ORDER — 0.9 % SODIUM CHLORIDE (POUR BTL) OPTIME
TOPICAL | Status: DC | PRN
  Administered 2021-11-16: 6000 mL

## 2021-11-16 MED ORDER — ROCURONIUM BROMIDE 10 MG/ML (PF) SYRINGE
PREFILLED_SYRINGE | INTRAVENOUS | Status: DC | PRN
Start: 2021-11-16 — End: 2021-11-16
  Administered 2021-11-16: 70 mg via INTRAVENOUS

## 2021-11-16 MED ORDER — FENTANYL CITRATE (PF) 100 MCG/2ML IJ SOLN: 100.0000 ug | Freq: Once | INTRAMUSCULAR | Status: AC

## 2021-11-16 MED ORDER — PHENYLEPHRINE HCL-NACL 20-0.9 MG/250ML-% IV SOLN
INTRAVENOUS | Status: DC | PRN
Start: 2021-11-16 — End: 2021-11-16
  Administered 2021-11-16: 50 ug/min via INTRAVENOUS

## 2021-11-16 MED ORDER — BUPROPION HCL ER (XL) 300 MG PO TB24
300.0000 mg | ORAL_TABLET | Freq: Every day | ORAL | Status: DC
  Administered 2021-11-17 – 2021-11-18 (×2): 300 mg via ORAL
  Filled 2021-11-16 (×2): qty 1

## 2021-11-16 MED ORDER — OXYCODONE HCL 5 MG PO TABS
5.0000 mg | ORAL_TABLET | ORAL | Status: DC | PRN
  Administered 2021-11-17 – 2021-11-18 (×4): 10 mg via ORAL
  Filled 2021-11-16 (×5): qty 2

## 2021-11-16 MED ORDER — ACETAMINOPHEN 325 MG PO TABS: 325.0000 mg | ORAL_TABLET | Freq: Four times a day (QID) | ORAL | Status: DC | PRN

## 2021-11-16 MED ORDER — SUGAMMADEX SODIUM 200 MG/2ML IV SOLN
INTRAVENOUS | Status: DC | PRN
  Administered 2021-11-16: 671.2 mg via INTRAVENOUS

## 2021-11-16 MED ORDER — METHOCARBAMOL 500 MG PO TABS
500.0000 mg | ORAL_TABLET | Freq: Four times a day (QID) | ORAL | Status: DC | PRN
  Administered 2021-11-17 – 2021-11-18 (×3): 500 mg via ORAL
  Filled 2021-11-16 (×3): qty 1

## 2021-11-16 MED ORDER — DEXAMETHASONE SODIUM PHOSPHATE 10 MG/ML IJ SOLN
INTRAMUSCULAR | Status: AC
  Filled 2021-11-16: qty 1

## 2021-11-16 MED ORDER — LORATADINE 10 MG PO TABS
10.0000 mg | ORAL_TABLET | Freq: Every day | ORAL | Status: DC
  Administered 2021-11-17 – 2021-11-18 (×2): 10 mg via ORAL
  Filled 2021-11-16 (×2): qty 1

## 2021-11-16 MED ORDER — ASPIRIN EC 81 MG PO TBEC
81.0000 mg | DELAYED_RELEASE_TABLET | Freq: Every day | ORAL | Status: DC
  Administered 2021-11-17 – 2021-11-18 (×2): 81 mg via ORAL
  Filled 2021-11-16 (×2): qty 1

## 2021-11-16 MED ORDER — TRANEXAMIC ACID-NACL 1000-0.7 MG/100ML-% IV SOLN
1000.0000 mg | INTRAVENOUS | Status: AC
  Administered 2021-11-16: 1000 mg via INTRAVENOUS
  Filled 2021-11-16: qty 100

## 2021-11-16 MED ORDER — DULOXETINE HCL 60 MG PO CPEP
60.0000 mg | ORAL_CAPSULE | Freq: Every day | ORAL | Status: DC
Start: 2021-11-17 — End: 2021-11-18
  Administered 2021-11-17 – 2021-11-18 (×2): 60 mg via ORAL
  Filled 2021-11-16 (×2): qty 1

## 2021-11-16 MED ORDER — ONDANSETRON HCL 4 MG/2ML IJ SOLN
INTRAMUSCULAR | Status: DC | PRN
  Administered 2021-11-16 (×2): 4 mg via INTRAVENOUS

## 2021-11-16 MED ORDER — LACTATED RINGERS IV SOLN: INTRAVENOUS | Status: DC

## 2021-11-16 MED ORDER — CEFAZOLIN IN SODIUM CHLORIDE 3-0.9 GM/100ML-% IV SOLN
3.0000 g | INTRAVENOUS | Status: AC
  Administered 2021-11-16: 3 g via INTRAVENOUS
  Filled 2021-11-16: qty 100

## 2021-11-16 MED ORDER — FUROSEMIDE 20 MG PO TABS
20.0000 mg | ORAL_TABLET | Freq: Every day | ORAL | Status: DC
  Administered 2021-11-17 – 2021-11-18 (×2): 20 mg via ORAL
  Filled 2021-11-16 (×2): qty 1

## 2021-11-16 MED ORDER — ALLOPURINOL 100 MG PO TABS
100.0000 mg | ORAL_TABLET | Freq: Two times a day (BID) | ORAL | Status: DC
  Administered 2021-11-16 – 2021-11-18 (×4): 100 mg via ORAL
  Filled 2021-11-16 (×4): qty 1

## 2021-11-16 MED ORDER — ACETAMINOPHEN 500 MG PO TABS
1000.0000 mg | ORAL_TABLET | Freq: Once | ORAL | Status: AC
  Administered 2021-11-16: 1000 mg via ORAL
  Filled 2021-11-16: qty 2

## 2021-11-16 MED ORDER — ONDANSETRON HCL 4 MG/2ML IJ SOLN: 4.0000 mg | Freq: Four times a day (QID) | INTRAMUSCULAR | Status: DC | PRN

## 2021-11-16 MED ORDER — ALBUTEROL SULFATE (2.5 MG/3ML) 0.083% IN NEBU: 3.0000 mL | INHALATION_SOLUTION | Freq: Four times a day (QID) | RESPIRATORY_TRACT | Status: DC | PRN

## 2021-11-16 MED ORDER — ONDANSETRON HCL 4 MG/2ML IJ SOLN
INTRAMUSCULAR | Status: AC
  Filled 2021-11-16: qty 2

## 2021-11-16 MED ORDER — ROCURONIUM BROMIDE 10 MG/ML (PF) SYRINGE
PREFILLED_SYRINGE | INTRAVENOUS | Status: AC
  Filled 2021-11-16: qty 10

## 2021-11-16 MED ORDER — EPHEDRINE SULFATE-NACL 50-0.9 MG/10ML-% IV SOSY
PREFILLED_SYRINGE | INTRAVENOUS | Status: DC | PRN
  Administered 2021-11-16: 15 mg via INTRAVENOUS
  Administered 2021-11-16: 10 mg via INTRAVENOUS

## 2021-11-16 MED ORDER — LEVOTHYROXINE SODIUM 150 MCG PO TABS
150.0000 ug | ORAL_TABLET | Freq: Every day | ORAL | Status: DC
  Administered 2021-11-17 – 2021-11-18 (×2): 150 ug via ORAL
  Filled 2021-11-16 (×2): qty 1

## 2021-11-16 MED ORDER — POVIDONE-IODINE 10 % EX SWAB
2.0000 "application " | Freq: Once | CUTANEOUS | Status: AC
  Administered 2021-11-16: 2 via TOPICAL

## 2021-11-16 MED ORDER — VASOPRESSIN 20 UNIT/ML IV SOLN
INTRAVENOUS | Status: DC | PRN
  Administered 2021-11-16: 1 [IU] via INTRAVENOUS

## 2021-11-16 MED ORDER — HYDROMORPHONE HCL 1 MG/ML IJ SOLN
0.5000 mg | INTRAMUSCULAR | Status: DC | PRN
  Administered 2021-11-17: 0.5 mg via INTRAVENOUS
  Filled 2021-11-16 (×2): qty 0.5

## 2021-11-16 MED ORDER — HYDROMORPHONE HCL 1 MG/ML IJ SOLN
0.2500 mg | INTRAMUSCULAR | Status: DC | PRN
  Administered 2021-11-16: 0.5 mg via INTRAVENOUS

## 2021-11-16 MED ORDER — BUPIVACAINE-EPINEPHRINE (PF) 0.5% -1:200000 IJ SOLN
INTRAMUSCULAR | Status: DC | PRN
  Administered 2021-11-16: 15 mL via PERINEURAL

## 2021-11-16 MED ORDER — BUPIVACAINE LIPOSOME 1.3 % IJ SUSP
INTRAMUSCULAR | Status: DC | PRN
  Administered 2021-11-16: 10 mL via PERINEURAL

## 2021-11-16 MED ORDER — GABAPENTIN 400 MG PO CAPS
400.0000 mg | ORAL_CAPSULE | Freq: Two times a day (BID) | ORAL | Status: DC
  Administered 2021-11-16 – 2021-11-18 (×4): 400 mg via ORAL
  Filled 2021-11-16 (×4): qty 1

## 2021-11-16 MED ORDER — ORAL CARE MOUTH RINSE: 15.0000 mL | Freq: Once | OROMUCOSAL | Status: AC

## 2021-11-16 MED ORDER — MIDAZOLAM HCL 2 MG/2ML IJ SOLN
INTRAMUSCULAR | Status: AC
  Administered 2021-11-16: 2 mg via INTRAVENOUS
  Filled 2021-11-16: qty 2

## 2021-11-16 MED ORDER — IRRISEPT - 450ML BOTTLE WITH 0.05% CHG IN STERILE WATER, USP 99.95% OPTIME
TOPICAL | Status: DC | PRN
  Administered 2021-11-16: 450 mL

## 2021-11-16 MED ORDER — MENTHOL 3 MG MT LOZG: 1.0000 | LOZENGE | OROMUCOSAL | Status: DC | PRN

## 2021-11-16 MED ORDER — EPHEDRINE 5 MG/ML INJ
INTRAVENOUS | Status: AC
  Filled 2021-11-16: qty 5

## 2021-11-16 SURGICAL SUPPLY — 85 items
ALCOHOL 70% 16 OZ (MISCELLANEOUS) ×3 IMPLANT
AUG COMP REV MI TAPER ADAPTER (Joint) ×3 IMPLANT
AUGMENT COMP REV MI TAPR ADPTR (Joint) IMPLANT
BAG COUNTER SPONGE SURGICOUNT (BAG) ×3 IMPLANT
BEARING HUMERAL SHLDER 36M STD (Shoulder) IMPLANT
BIT DRILL 2.7 W/STOP DISP (BIT) ×1 IMPLANT
BIT DRILL QUICK REL 1/8 2PK SL (DRILL) IMPLANT
BIT DRILL TWIST 2.7 (BIT) ×1 IMPLANT
BLADE SAW SGTL 13X75X1.27 (BLADE) ×3 IMPLANT
CANISTER WOUND CARE 500ML ATS (WOUND CARE) ×1 IMPLANT
CHLORAPREP W/TINT 26 (MISCELLANEOUS) ×3 IMPLANT
COOLER ICEMAN CLASSIC (MISCELLANEOUS) ×3 IMPLANT
COVER SURGICAL LIGHT HANDLE (MISCELLANEOUS) ×3 IMPLANT
DRAPE INCISE IOBAN 66X45 STRL (DRAPES) ×3 IMPLANT
DRAPE U-SHAPE 47X51 STRL (DRAPES) ×6 IMPLANT
DRESSING PEEL AND PLAC PRVNA20 (GAUZE/BANDAGES/DRESSINGS) IMPLANT
DRILL QUICK RELEASE 1/8 INCH (DRILL) ×3
DRSG AQUACEL AG ADV 3.5X10 (GAUZE/BANDAGES/DRESSINGS) ×2 IMPLANT
DRSG PEEL AND PLACE PREVENA 20 (GAUZE/BANDAGES/DRESSINGS) ×3
ELECT BLADE 4.0 EZ CLEAN MEGAD (MISCELLANEOUS) ×3
ELECT REM PT RETURN 9FT ADLT (ELECTROSURGICAL) ×3
ELECTRODE BLDE 4.0 EZ CLN MEGD (MISCELLANEOUS) ×2 IMPLANT
ELECTRODE REM PT RTRN 9FT ADLT (ELECTROSURGICAL) ×2 IMPLANT
GAUZE SPONGE 4X4 12PLY STRL LF (GAUZE/BANDAGES/DRESSINGS) ×2 IMPLANT
GLENOID SPHERE STD STRL 36MM (Orthopedic Implant) ×1 IMPLANT
GLOVE SRG 8 PF TXTR STRL LF DI (GLOVE) ×2 IMPLANT
GLOVE SURG LTX SZ7 (GLOVE) ×3 IMPLANT
GLOVE SURG LTX SZ8 (GLOVE) ×3 IMPLANT
GLOVE SURG UNDER POLY LF SZ7 (GLOVE) ×3 IMPLANT
GLOVE SURG UNDER POLY LF SZ8 (GLOVE) ×3
GOWN STRL REUS W/ TWL LRG LVL3 (GOWN DISPOSABLE) ×2 IMPLANT
GOWN STRL REUS W/ TWL XL LVL3 (GOWN DISPOSABLE) ×2 IMPLANT
GOWN STRL REUS W/TWL LRG LVL3 (GOWN DISPOSABLE) ×3
GOWN STRL REUS W/TWL XL LVL3 (GOWN DISPOSABLE) ×3
GUIDE MODEL REV SHLD LT (ORTHOPEDIC DISPOSABLE SUPPLIES) ×1 IMPLANT
HYDROGEN PEROXIDE 16OZ (MISCELLANEOUS) ×3 IMPLANT
JET LAVAGE IRRISEPT WOUND (IRRIGATION / IRRIGATOR) ×3
KIT BASIN OR (CUSTOM PROCEDURE TRAY) ×3 IMPLANT
KIT DRSG PREVENA PLUS 7DAY 125 (MISCELLANEOUS) ×1 IMPLANT
KIT TURNOVER KIT B (KITS) ×3 IMPLANT
LAVAGE JET IRRISEPT WOUND (IRRIGATION / IRRIGATOR) ×2 IMPLANT
LOOP VESSEL MAXI BLUE (MISCELLANEOUS) ×3 IMPLANT
MANIFOLD NEPTUNE II (INSTRUMENTS) ×3 IMPLANT
NDL SUT 6 .5 CRC .975X.05 MAYO (NEEDLE) IMPLANT
NDL TAPERED W/ NITINOL LOOP (MISCELLANEOUS) ×2 IMPLANT
NEEDLE MAYO TAPER (NEEDLE)
NEEDLE TAPERED W/ NITINOL LOOP (MISCELLANEOUS) ×3 IMPLANT
NS IRRIG 1000ML POUR BTL (IV SOLUTION) ×3 IMPLANT
PACK SHOULDER (CUSTOM PROCEDURE TRAY) ×3 IMPLANT
PAD ARMBOARD 7.5X6 YLW CONV (MISCELLANEOUS) ×6 IMPLANT
PAD COLD SHLDR WRAP-ON (PAD) ×3 IMPLANT
PASSER SUT SWANSON 36MM LOOP (INSTRUMENTS) ×3 IMPLANT
PIN THREADED REVERSE (PIN) ×2 IMPLANT
REAMER GUIDE BUSHING SURG DISP (MISCELLANEOUS) ×1 IMPLANT
REAMER GUIDE W/SCREW AUG (MISCELLANEOUS) ×1 IMPLANT
RESTRAINT HEAD UNIVERSAL NS (MISCELLANEOUS) ×3 IMPLANT
RETRIEVER SUT HEWSON (MISCELLANEOUS) ×2 IMPLANT
SCREW BONE LOCKING 4.75X30X3.5 (Screw) ×1 IMPLANT
SCREW BONE STRL 6.5MMX25MM (Screw) ×1 IMPLANT
SCREW LOCKING 4.75MMX15MM (Screw) ×2 IMPLANT
SCREW LOCKING NS 4.75MMX20MM (Screw) ×1 IMPLANT
SHOULDER HUMERAL BEAR 36M STD (Shoulder) ×3 IMPLANT
SLING ARM IMMOBILIZER LRG (SOFTGOODS) ×3 IMPLANT
SOL PREP POV-IOD 4OZ 10% (MISCELLANEOUS) ×3 IMPLANT
SPONGE T-LAP 18X18 ~~LOC~~+RFID (SPONGE) ×5 IMPLANT
STEM HUMERAL STRL 9MMX83MM (Stem) ×1 IMPLANT
STRIP CLOSURE SKIN 1/2X4 (GAUZE/BANDAGES/DRESSINGS) ×2 IMPLANT
SUCTION FRAZIER HANDLE 10FR (MISCELLANEOUS) ×3
SUCTION TUBE FRAZIER 10FR DISP (MISCELLANEOUS) ×2 IMPLANT
SUT BROADBAND TAPE 2PK 1.5 (SUTURE) ×3 IMPLANT
SUT FIBERWIRE #2 38 T-5 BLUE (SUTURE)
SUT MAXBRAID (SUTURE) IMPLANT
SUT MNCRL AB 3-0 PS2 18 (SUTURE) ×3 IMPLANT
SUT SILK 2 0 TIES 10X30 (SUTURE) ×3 IMPLANT
SUT VIC AB 0 CT1 27 (SUTURE) ×15
SUT VIC AB 0 CT1 27XBRD ANBCTR (SUTURE) ×8 IMPLANT
SUT VIC AB 1 CT1 27 (SUTURE) ×9
SUT VIC AB 1 CT1 27XBRD ANBCTR (SUTURE) ×4 IMPLANT
SUT VIC AB 2-0 CT1 27 (SUTURE) ×9
SUT VIC AB 2-0 CT1 TAPERPNT 27 (SUTURE) ×6 IMPLANT
SUT VICRYL 0 UR6 27IN ABS (SUTURE) ×7 IMPLANT
SUTURE FIBERWR #2 38 T-5 BLUE (SUTURE) IMPLANT
TOWEL GREEN STERILE (TOWEL DISPOSABLE) ×3 IMPLANT
TRAY HUM REV SHOULDER STD +6 (Shoulder) ×1 IMPLANT
WATER STERILE IRR 1000ML POUR (IV SOLUTION) ×3 IMPLANT

## 2021-11-16 NOTE — Anesthesia Preprocedure Evaluation (Addendum)
Anesthesia Evaluation  ?Patient identified by MRN, date of birth, ID band ?Patient awake ? ? ? ?Reviewed: ?Allergy & Precautions, H&P , NPO status , Patient's Chart, lab work & pertinent test results ? ?Airway ?Mallampati: III ? ?TM Distance: >3 FB ?Neck ROM: Full ? ? ? Dental ?no notable dental hx. ?(+) Teeth Intact, Dental Advisory Given ?  ?Pulmonary ?shortness of breath and with exertion, asthma , sleep apnea , former smoker,  ?  ?Pulmonary exam normal ?breath sounds clear to auscultation ? ? ? ? ? ? Cardiovascular ?hypertension, Pt. on medications ? ?Rhythm:Regular Rate:Normal ? ? ?  ?Neuro/Psych ? Headaches, Anxiety Depression   ? GI/Hepatic ?Neg liver ROS, GERD  ,  ?Endo/Other  ?Hypothyroidism Morbid obesity ? Renal/GU ?negative Renal ROS  ?negative genitourinary ?  ?Musculoskeletal ? ?(+) Arthritis , Osteoarthritis,   ? Abdominal ?  ?Peds ? Hematology ? ?(+) Blood dyscrasia, anemia ,   ?Anesthesia Other Findings ? ? Reproductive/Obstetrics ?negative OB ROS ? ?  ? ? ? ? ? ? ? ? ? ? ? ? ? ?  ?  ? ? ? ? ? ? ? ?Anesthesia Physical ?Anesthesia Plan ? ?ASA: 3 ? ?Anesthesia Plan: General  ? ?Post-op Pain Management: Regional block* and Tylenol PO (pre-op)*  ? ?Induction: Intravenous ? ?PONV Risk Score and Plan: 4 or greater and Ondansetron, Dexamethasone and Midazolam ? ?Airway Management Planned: Oral ETT ? ?Additional Equipment: CVP and Ultrasound Guidance Line Placement ? ?Intra-op Plan:  ? ?Post-operative Plan: Extubation in OR ? ?Informed Consent: I have reviewed the patients History and Physical, chart, labs and discussed the procedure including the risks, benefits and alternatives for the proposed anesthesia with the patient or authorized representative who has indicated his/her understanding and acceptance.  ? ? ? ?Dental advisory given ? ?Plan Discussed with: CRNA ? ?Anesthesia Plan Comments:   ? ? ? ? ? ?Anesthesia Quick Evaluation ? ?

## 2021-11-16 NOTE — Progress Notes (Signed)
Orthopedic Tech Progress Note ?Patient Details:  ?Marilyn [name] ?[birth year] ?[id number] ? ?Ortho Devices ?Type of Ortho Device: Crutches, Knee Immobilizer ?Ortho Device/Splint Location: left ?Ortho Device/Splint Interventions: Ordered ?  ?Post Interventions ?Patient Tolerated: Well ? ?Marilyn [name] ?11/16/2021, 7:13 PM ?Unable to find order. Applied knee immobilizer and delivered, adjusted and went over how to use crutches. ?

## 2021-11-16 NOTE — Transfer of Care (Signed)
Immediate Anesthesia Transfer of Care Note ? ?Patient: Marilyn [name] ? ?Procedure(s) Performed: LEFT REVERSE SHOULDER ARTHROPLASTY (Left: Shoulder) ?APPLICATION OF WOUND VAC ? ?Patient Location: PACU ? ?Anesthesia Type:GA combined with regional for post-op pain ? ?Level of Consciousness: drowsy ? ?Airway & Oxygen Therapy: Patient Spontanous Breathing and Patient connected to face mask oxygen ? ?Post-op Assessment: Report given to RN and Post -op Vital signs reviewed and stable ? ?Post vital signs: Reviewed and stable ? ?Last Vitals:  ?Vitals Value Taken Time  ?BP 151/72 11/16/21 1713  ?Temp    ?Pulse 86 11/16/21 1714  ?Resp 20 11/16/21 1714  ?SpO2 100 % 11/16/21 1714  ?Vitals shown include unvalidated device data. ? ?Last Pain:  ?Vitals:  ? 11/16/21 1220  ?TempSrc:   ?PainSc: 0-No pain  ?   ? ?Patients Stated Pain Goal: 2 (11/16/21 1109) ? ?Complications: No notable events documented. ?

## 2021-11-16 NOTE — H&P (Signed)
Marilyn [name] is an 51 y.o. female.   Chief Complaint: Left shoulder pain HPI: Marilyn [name] is a 51 year old patient with bilateral shoulder pain left worse than right which has been going on for about 10 years.  She has a history of left shoulder arthroscopy x2.  Recent injection more than 2 months ago which gave her marginal relief.  She has her parents at home along with some children nearby.  The pain wakes her from sleep at night.  She cannot sleep well.  She has pain on a daily basis.  She has to sleep with her torso elevated and her arm propped up.  No history of diabetes but she does have history of hypertension.  She also has a history of bilateral knee replacements as well as a hip replacement which did have deep infection.  Currently no infection.  Back also has undergone surgery with subsequent superficial infection.  Patient also has morbid obesity  Past Medical History:  Diagnosis Date   Anemia    taking iron now. pt states having no current issues 09/02/2015   Anginal pain ([place])    pt states experiences chest wall pain pt states related to her asthma    Anxiety    with MRI's   Arthritis    Everywhere   Asthma    Depression    Dizziness    GERD (gastroesophageal reflux disease)    Headache(784.0)    HX OF MIGRAINES   History of bronchitis    Hypertension    Hypothyroidism    takes levothyroxen   OSA on CPAP    wears cpap   Shortness of breath    with exertion   Wears glasses     Past Surgical History:  Procedure Laterality Date   BACK SURGERY     CESAREAN SECTION     times 2   CHOLECYSTECTOMY     COLONOSCOPY  2020   ENDOMETRIAL ABLATION     INCISION AND DRAINAGE HIP Left 11/10/2015   Procedure: IRRIGATION AND DEBRIDEMENT LEFT HIP INCISION;  Surgeon: Mcarthur Rossetti, MD;  Location: [facility];  Service: Orthopedics;  Laterality: Left;   JOINT REPLACEMENT  2011   total left knee   KNEE ARTHROPLASTY  04/23/2012   right    KNEE ARTHROSCOPY     LUMBAR  LAMINECTOMY/DECOMPRESSION MICRODISCECTOMY N/A 06/23/2019   Procedure: RIGHT LUMBAR FIVE THROUGH SACRAL ONE PARTIAL HEMILAMINECTOMY WITH RIGHT LUMBAR FIVE FORAMINOTOMY;  Surgeon: Jessy Oto, MD;  Location: [facility];  Service: Orthopedics;  Laterality: N/A;   LUMBAR WOUND DEBRIDEMENT N/A 06/11/2018   Procedure: LUMBAR WOUND DEBRIDEMENT;  Surgeon: Jessy Oto, MD;  Location: [facility];  Service: Orthopedics;  Laterality: N/A;   RADIOLOGY WITH ANESTHESIA N/A 09/09/2018   Procedure: LUMBER SPINE WITHOUT CONTRAST;  Surgeon: Radiologist, Medication, MD;  Location: [facility];  Service: Radiology;  Laterality: N/A;   ROTATOR CUFF REPAIR     left    SHOULDER SURGERY     right to repair ligament tear   TOTAL HIP ARTHROPLASTY Left 09/09/2015   Procedure: LEFT TOTAL HIP ARTHROPLASTY ANTERIOR APPROACH;  Surgeon: Mcarthur Rossetti, MD;  Location: [facility];  Service: Orthopedics;  Laterality: Left;   TOTAL KNEE ARTHROPLASTY  04/23/2012   Procedure: TOTAL KNEE ARTHROPLASTY;  Surgeon: Newt Minion, MD;  Location: [facility];  Service: Orthopedics;  Laterality: Right;  Right Total Knee Arthroplasty   TUBAL LIGATION  1996    Family History  Problem Relation Age of Onset  Cancer Mother        colon   Epilepsy Mother    Cancer Father        prostate   Diabetes Father    Hypertension Father    Hypertension Maternal Aunt    Diabetes Maternal Aunt    Hypertension Paternal Aunt    Social History:  reports that she quit smoking about 34 years ago. Her smoking use included cigarettes. She has a 2.00 pack-year smoking history. She has never used smokeless tobacco. She reports that she does not drink alcohol and does not use drugs.  Allergies:  Allergies  Allergen Reactions   Lisinopril Anaphylaxis    Angioedema   Bee Venom Swelling and Other (See Comments)    Swelling at the site   Bupropion Swelling   Propoxyphene Hives   Codeine Nausea Only   Latex Rash   Meloxicam Other (See Comments)    Insomnia,  constipation   Morphine And Related Rash   Tomato Rash    Medications Prior to Admission  Medication Sig Dispense Refill   albuterol (VENTOLIN HFA) 108 (90 Base) MCG/ACT inhaler Inhale 1-2 puffs into the lungs every 6 (six) hours as needed for wheezing or shortness of breath. 8 g 0   allopurinol (ZYLOPRIM) 100 MG tablet Take 1 tablet by mouth twice daily 180 tablet 0   ascorbic Acid (VITAMIN C) 500 MG CPCR Take 500 mg by mouth daily. Gummy     buPROPion (WELLBUTRIN XL) 300 MG 24 hr tablet Take 300 mg by mouth daily.     cetirizine (ZYRTEC) 10 MG tablet Take 10 mg by mouth daily.     clotrimazole-betamethasone (LOTRISONE) cream Apply 1 application topically daily as needed (irritation).     cyclobenzaprine (FLEXERIL) 10 MG tablet Take 10 mg by mouth 3 (three) times daily as needed for muscle spasms.     cycloSPORINE (RESTASIS) 0.05 % ophthalmic emulsion Place 1 drop into both eyes 2 (two) times daily as needed (dry eyes).     DULoxetine (CYMBALTA) 60 MG capsule Take 60 mg by mouth daily.     Ferrous Gluconate 324 (37.5 Fe) MG TABS Take 324 mg by mouth daily.     furosemide (LASIX) 20 MG tablet Take 20 mg by mouth daily.     gabapentin (NEURONTIN) 400 MG capsule Take 400 mg by mouth 2 (two) times daily.     hydrochlorothiazide (HYDRODIURIL) 25 MG tablet Take 25 mg by mouth daily.     levothyroxine (SYNTHROID) 150 MCG tablet Take 150 mcg by mouth daily before breakfast.     NARCAN 4 MG/0.1ML LIQD nasal spray kit Place 0.4 mg into the nose as needed (opiate overdose).      oxybutynin (DITROPAN XL) 15 MG 24 hr tablet Take 15 mg by mouth daily.      oxyCODONE-acetaminophen (PERCOCET) 7.5-325 MG tablet Take 1 tablet by mouth every 6 (six) hours as needed for severe pain.     vitamin B-12 (CYANOCOBALAMIN) 250 MCG tablet Take 250 mcg by mouth daily.     Vitamin D, Ergocalciferol, (DRISDOL) 1.25 MG (50000 UNIT) CAPS capsule Take 50,000 Units by mouth every Monday.     zolpidem (AMBIEN) 10 MG tablet  Take 10 mg by mouth at bedtime.     ciprofloxacin (CIPRO) 500 MG tablet Take 1 tablet (500 mg total) by mouth 2 (two) times daily for 3 days. 6 tablet 0   Misc. Devices MISC Below the knee compression hose style calf Extra large size 1 pair  15-20 mmHg    Fax to [facility] in [place], [name]     traMADol (ULTRAM) 50 MG tablet Take 2 tablets (100 mg total) by mouth every 6 (six) hours as needed. (Patient not taking: Reported on 11/07/2021) 30 tablet 0    No results found for this or any previous visit (from the past 48 hour(s)). No results found.  Review of Systems  Musculoskeletal:  Positive for arthralgias.  All other systems reviewed and are negative.  There were no vitals taken for this visit. Physical Exam Vitals reviewed.  HENT:     Head: Normocephalic.     Nose: Nose normal.     Mouth/Throat:     Mouth: Mucous membranes are moist.  Eyes:     Pupils: Pupils are equal, round, and reactive to light.  Cardiovascular:     Rate and Rhythm: Normal rate.     Pulses: Normal pulses.  Pulmonary:     Effort: Pulmonary effort is normal.  Abdominal:     General: Abdomen is flat.  Musculoskeletal:     Cervical back: Normal range of motion.  Skin:    General: Skin is warm.     Capillary Refill: Capillary refill takes less than 2 seconds.  Neurological:     General: No focal deficit present.     Mental Status: She is alert.  Psychiatric:        Mood and Affect: Mood normal.    Ortho exam demonstrates full active and passive range of motion of the cervical spine.  Left shoulder passive range of motion is 20/70/85.  Has diminished external rotation strength but subscap strength is intact.  Deltoid is functional.  Prior arthroscopy surgical incisions present.  Radial pulse intact.  EPL FPL interosseous intact on that left hand.  Patient has similar exam findings but with slightly more range of motion on the right-hand side but with preserved and improved posterior superior rotator  cuff strength. Assessment/Plan Impression is severe left shoulder arthritis.  Patient has a small glenoid by CT scanning.  She has diminished glenoid bone stock.  Her best bet for long-term shoulder function precluding the need for revision surgery would be reverse shoulder replacement.  She does have a history of infections after other surgeries.  She is at higher risk because of her increased body mass index as well as general health status.  She has had prior surgeries involving the rotator cuff which also makes reverse replacement her best option.  Nonetheless this is a risky procedure for her at her age.  She has debilitating pain as well as significantly diminished range of motion.  The risk and benefits of surgery are discussed with the patient including not limited to infection nerve or vessel damage dislocation incomplete pain relief as well as incomplete functional restoration of the shoulder.  I think there is a chance she may need to be revised in her lifetime.  Preoperative planning was performed to minimize glenoid bone loss and the preparation for the baseplate.  Patient understands the risk and benefits and wishes to proceed.  We will plan to use a Prevena wound VAC after surgery as well as keep her in the hospital overnight for 2 more doses of postop IV antibiotics.  Although she is a higher risk patient she nonetheless desires an attempt at surgical improvement based on her diminished quality of life.  Anderson Malta, MD 11/16/2021, 10:17 AM

## 2021-11-16 NOTE — Anesthesia Procedure Notes (Signed)
Anesthesia Regional Block: Interscalene brachial plexus block  ? ?Pre-Anesthetic Checklist: , timeout performed,  Correct Patient, Correct Site, Correct Laterality,  Correct Procedure, Correct Position, site marked,  Risks and benefits discussed,  Pre-op evaluation,  At surgeon's request and post-op pain management ? ?Laterality: Left ? ?Prep: Maximum Sterile Barrier Precautions used, chloraprep     ?  ?Needles:  ?Injection technique: Single-shot ? ?Needle Type: Echogenic Stimulator Needle   ? ? ?Needle Length: 5cm  ?Needle Gauge: 22  ? ? ? ?Additional Needles: ? ? ?Procedures:,,,, ultrasound used (permanent image in chart),,    ?Narrative:  ?Start time: 11/16/2021 12:03 PM ?End time: 11/16/2021 12:13 PM ?Injection made incrementally with aspirations every 5 mL. ? ?Performed by: Personally  ?Anesthesiologist: Roderic Palau, MD ? ? ? ? ?

## 2021-11-16 NOTE — Anesthesia Procedure Notes (Signed)
Procedure Name: Intubation ?Date/Time: 11/16/2021 12:40 PM ?Performed by: Minerva Ends, CRNA ?Pre-anesthesia Checklist: Patient identified, Emergency Drugs available, Suction available and Patient being monitored ?Patient Re-evaluated:Patient Re-evaluated prior to induction ?Oxygen Delivery Method: Circle system utilized ?Preoxygenation: Pre-oxygenation with 100% oxygen ?Induction Type: IV induction ?Ventilation: Mask ventilation with difficulty and Oral airway inserted - appropriate to patient size ?Laryngoscope Size: Mac and 3 ?Grade View: Grade I ?Tube type: Oral ?Tube size: 7.0 mm ?Number of attempts: 1 ?Airway Equipment and Method: Stylet and Oral airway ?Placement Confirmation: ETT inserted through vocal cords under direct vision, positive ETCO2 and breath sounds checked- equal and bilateral ?Secured at: 22 cm ?Tube secured with: Tape ?Dental Injury: Teeth and Oropharynx as per pre-operative assessment  ? ? ? ? ?

## 2021-11-16 NOTE — Anesthesia Postprocedure Evaluation (Signed)
Anesthesia Post Note ? ?Patient: Seward Speck Kartes ? ?Procedure(s) Performed: LEFT REVERSE SHOULDER ARTHROPLASTY (Left: Shoulder) ?APPLICATION OF WOUND VAC ? ?  ? ?Patient location during evaluation: PACU ?Anesthesia Type: General ?Level of consciousness: awake and alert, patient cooperative and oriented ?Pain management: pain level controlled ?Vital Signs Assessment: post-procedure vital signs reviewed and stable ?Respiratory status: spontaneous breathing, nonlabored ventilation and respiratory function stable ?Cardiovascular status: blood pressure returned to baseline and stable ?Postop Assessment: no apparent nausea or vomiting ?Anesthetic complications: no ? ? ?No notable events documented. ? ?Last Vitals:  ?Vitals:  ? 11/16/21 1903 11/16/21 1933  ?BP: 125/61 136/60  ?Pulse: 78 79  ?Resp: 20 20  ?Temp:    ?SpO2: 95% 95%  ?  ?Last Pain:  ?Vitals:  ? 11/16/21 1903  ?TempSrc:   ?PainSc: 0-No pain  ? ? ?  ?  ?  ?  ?  ?  ? ?Shardai Star,E. Parrie Rasco ? ? ? ? ?

## 2021-11-16 NOTE — Brief Op Note (Addendum)
? ?  11/16/2021 ? ?4:45 PM ? ?PATIENT:  Marilyn [name]  51 y.o. female ? ?PRE-OPERATIVE DIAGNOSIS:  left shoulder osteoarthritis ? ?POST-OPERATIVE DIAGNOSIS:  left shoulder osteoarthritis ? ?PROCEDURE:  Procedure(s): ?LEFT REVERSE SHOULDER ARTHROPLASTY ?APPLICATION OF WOUND VAC ? ?SURGEON:  Surgeon(s): ?Cammy Copa, MD ? ?ASSISTANT: magnant pa ? ?ANESTHESIA:   general ? ?EBL: 50 ml   ? ?Total I/O ?In: 1000 [I.V.:1000] ?Out: 150 [Urine:100; Blood:50] ? ?BLOOD ADMINISTERED: none ? ?DRAINS: none  ? ?LOCAL MEDICATIONS USED:  vancomycin ? ?SPECIMEN:  No Specimen ? ?COUNTS:  YES ? ?TOURNIQUET:  * No tourniquets in log * ? ?DICTATION: .Other Dictation: Dictation Number [id number] ? ?PLAN OF CARE: Admit for overnight observation ? ?PATIENT DISPOSITION:  PACU - hemodynamically stable ? ?Components placed were Biomet glenosphere 36 standard with small augmented baseplate 1 central compression screw 4 peripheral locking screws with mini humeral stem size 9 and 36 standard bearing with mini humeral tray +6 taper offset 40 mm in diameter. ? ? ? ? ? ? ? ? ? ? ?  ?

## 2021-11-16 NOTE — Anesthesia Procedure Notes (Signed)
Central Venous Catheter Insertion ?Performed by: Gaynelle Adu, MD, anesthesiologist ?Start/End3/10/2021 11:45 AM, 11/16/2021 11:55 AM ?Patient location: Pre-op. ?Preanesthetic checklist: patient identified, IV checked, site marked, risks and benefits discussed, surgical consent, monitors and equipment checked, pre-op evaluation, timeout performed and anesthesia consent ?Position: Trendelenburg ?Lidocaine 1% used for infiltration and patient sedated ?Hand hygiene performed , maximum sterile barriers used  and Seldinger technique used ?Catheter size: 8 Fr ?Total catheter length 16. ?Central line was placed.Double lumen ?Procedure performed using ultrasound guided technique. ?Ultrasound Notes:anatomy identified, needle tip was noted to be adjacent to the nerve/plexus identified, no ultrasound evidence of intravascular and/or intraneural injection and image(s) printed for medical record ?Attempts: 1 ?Following insertion, dressing applied, line sutured and Biopatch. ?Post procedure assessment: blood return through all ports ? ?Patient tolerated the procedure well with no immediate complications. ? ? ? ? ?

## 2021-11-16 NOTE — Op Note (Signed)
NAME: Marilyn [name], Marilyn [name]. ?MEDICAL RECORD NO: [id number] ?ACCOUNT NO: [id number] ?DATE OF BIRTH: 1971-06-17 ?FACILITY: [facility] ?LOCATION: [facility] ?PHYSICIAN: Yetta Barre. Marlou Sa, MD ? ?Operative Report  ? ?DATE OF PROCEDURE: 11/16/2021 ? ?PREOPERATIVE DIAGNOSIS:  Left shoulder arthritis. ? ?POSTOPERATIVE DIAGNOSIS:  Left shoulder arthritis. ? ?PROCEDURE:  Left reverse shoulder replacement. ? ?SURGEON ATTENDING:  Yetta Barre. Marlou Sa, MD ? ?ASSISTANT:  Annie Main. ? ?INDICATIONS:  The patient is a 51 year old patient with left shoulder arthritis and history of rotator cuff surgery.  She presents now for operative management after explanation of risks and benefits.  She has small glenoid vault and thus reverse  ?replacement is chosen to minimize risk of subsequent rotator cuff repair failure. ? ?PROCEDURE IN DETAIL:  The patient was brought to the operating room where general anesthetic was induced.  Preoperative antibiotics administered.  Timeout was called.  The patient was placed in the beach chair position with the head in neutral position.  ? Left arm, shoulder and hand prescrubbed with alcohol and Betadine, allowed to air dry, prepped with ChloraPrep solution and draped in sterile manner.  Ioban used to cover the operative field.  Timeout was called.  The patient did have a Foley placed and ? the urine looked clear.  This was sent for culture.  The patient has been on Cipro for 2 days prior to the procedure for abnormal urinalysis.  Next, operative field was covered with [place].  Deltopectoral approach was made.  Skin and subcutaneous tissue  ?sharply divided.  Case was significantly more difficult due to the patient's soft tissue envelope.  Skin and subcutaneous tissue sharply divided.  Bleeding points encountered controlled using electrocautery.  Self-retaining retractors were placed.   ?Cephalic vein mobilized laterally and protected.  The plane between the subscap and the conjoined tendon was developed.  Axillary nerve was  visualized and palpated and protected at all times during the case.  Next, the subdeltoid adhesions were released  ?manually.  The deltoid was released off its anterior attachment also manually to relax for retraction.  Next, the biceps tendon was tenodesed to the pectoralis tendon, which was released about 1.5 to 2 cm.  Shoulder was very tight.  Next, the circumflex  ?vessels were ligated.  Subscap was then removed sharply from the lesser tuberosity using a 15 blade.  Stay sutures were placed.  After tenodesis the superior portion of the biceps tendon was used to open the rotator interval to the base of the coracoid.  ? Rotator cuff thinning was present at the anterior supraspinatus attachment.  This was released to facilitate exposure down the road.  Next, the capsule was released off circumferentially around to the 7 o'clock position on the humeral head and neck.   ?Using a Cobb elevator and blunt dissection, the capsule was released about 2 cm inferior to the humeral neck.  This allowed for dislocation of the humeral head with placement of the [facility] retractor.  Reaming was then performed up to a size 9.  Broaching ? then performed up to a size 9 and a cap was placed.  It should be noted that the humeral head was cut in about 30 degrees of retroversion, which matched her native version and then the broaching was performed.  Next, the posterior retractor was placed.  ? Soft tissue dissection was performed from the 12 o'clock to 6 o'clock position on the glenoid with the axillary nerve protected.  Bankart lesion was created.  Capsule labral complex was excised circumferentially.  Next, using  patient-specific  ?instrumentation, the guide was placed on the glenoid surface.  Two guide pins were placed.  At this time, reaming was performed in accordance with preoperative templating.  Excellent quality bone was encountered.  At this time, reaming for the augment  ?was performed.  Then, trial was placed which showed  good contact.  At this time, the true baseplate was placed with excellent purchase obtained with one central compression screw and four peripheral lag screws.  No perforation from the central boss was  ?present.  At this time, spacing mandated a standard glenosphere with 1.5 mm inferior offset.  This was placed.  Next, the true stem was placed and then the shoulder was reduced with a +6 taper offset humeral tray and standard polyethylene bearing.  The  ?patient had very good stability to adduction and extension along with forward flexion and internal and external rotation, 90 degrees of abduction.  Difficult to reduce, difficult to dislocate.  Two finger tightness was present.  Axillary nerve was again  ?palpated and tug test was positive for continuity of the nerve.  Before placing the true stem we did drill holes for subscap repair.  True components were placed with same stability parameters maintained.  Subscap was repaired to the inferior medial  ?humeral neck due to additional offset from the implant.  Next, thorough irrigation was performed with about 4 liters of irrigating solution.  IrriSept solution was utilized after the first incision after the arthrotomy and at multiple times during the  ?case.  After irrigating with saline, IrriSept solution again utilized and then vancomycin powder placed.  Deltopectoral interval was then closed using #1 Vicryl suture followed by irrigation and more vancomycin powder, then 0 Vicryl suture, 2-0 Vicryl  ?suture, and 3-0 Monocryl with Prevena wound VAC applied.  The patient was then placed in a large sling.  She tolerated the procedure well without immediate complications.  Luke's assistance was required at all times during the case for retraction,  ?opening, closing, mobilization of tissue.  His assistance was a medical necessity. ? ? ?VAI ?D: 11/16/2021 4:54:14 pm T: 11/16/2021 10:44:00 pm  ?JOB: [id number]  ?

## 2021-11-17 ENCOUNTER — Encounter (HOSPITAL_COMMUNITY): Payer: Self-pay | Admitting: Orthopedic Surgery

## 2021-11-17 DIAGNOSIS — M19012 Primary osteoarthritis, left shoulder: Secondary | ICD-10-CM | POA: Diagnosis not present

## 2021-11-17 LAB — URINE CULTURE: Culture: NO GROWTH

## 2021-11-17 LAB — BASIC METABOLIC PANEL
Anion gap: 11 (ref 5–15)
BUN: 13 mg/dL (ref 6–20)
CO2: 23 mmol/L (ref 22–32)
Calcium: 8.5 mg/dL — ABNORMAL LOW (ref 8.9–10.3)
Chloride: 98 mmol/L (ref 98–111)
Creatinine, Ser: 1.34 mg/dL — ABNORMAL HIGH (ref 0.44–1.00)
GFR, Estimated: 48 mL/min — ABNORMAL LOW (ref >60)
Glucose, Bld: 158 mg/dL — ABNORMAL HIGH (ref 70–99)
Potassium: 3.8 mmol/L (ref 3.5–5.1)
Sodium: 132 mmol/L — ABNORMAL LOW (ref 135–145)

## 2021-11-17 LAB — CBC
HCT: 33.2 % — ABNORMAL LOW (ref 36.0–46.0)
Hemoglobin: 10.7 g/dL — ABNORMAL LOW (ref 12.0–15.0)
MCH: 29.4 pg (ref 26.0–34.0)
MCHC: 32.2 g/dL (ref 30.0–36.0)
MCV: 91.2 fL (ref 80.0–100.0)
Platelets: 254 10*3/uL (ref 150–400)
RBC: 3.64 MIL/uL — ABNORMAL LOW (ref 3.87–5.11)
RDW: 14.6 % (ref 11.5–15.5)
WBC: 9.6 10*3/uL (ref 4.0–10.5)
nRBC: 0 % (ref 0.0–0.2)

## 2021-11-17 MED ORDER — CEFAZOLIN SODIUM-DEXTROSE 2-4 GM/100ML-% IV SOLN
2.0000 g | Freq: Once | INTRAVENOUS | Status: AC
Start: 2021-11-17 — End: 2021-11-18
  Administered 2021-11-17: 2 g via INTRAVENOUS
  Filled 2021-11-17: qty 100

## 2021-11-17 MED ORDER — CHLORHEXIDINE GLUCONATE CLOTH 2 % EX PADS
6.0000 | MEDICATED_PAD | Freq: Every day | CUTANEOUS | Status: DC
  Administered 2021-11-18: 6 via TOPICAL

## 2021-11-17 NOTE — Progress Notes (Signed)
Pt received from PACU in stable condition. Pt A/Ox4. Denies pain. Pt able to wiggle fingers of left arm but states her arm is still numb. Sling, wound vac and ice machine in place. Pt assisted with ambulating to bathroom. Tolerated well.  ?

## 2021-11-17 NOTE — Progress Notes (Signed)
Spoke with patient on phone. Plan for discharge home tomorrow ?

## 2021-11-17 NOTE — Progress Notes (Signed)
?  Subjective: ?Marilyn [name] is a 51 y.o. female s/p left RSA.  They are POD 1.  Pt's pain is controlled.  Block still in effect but wearing off in the hand.  Denies any significant pain.  She has no other complaints aside from the light shoulder pain.  Specifically denies any chest pain, shortness of breath, headache, abdominal pain.  She is eating upon interview. ? ?Objective: ?Vital signs in last 24 hours: ?Temp:  [97.6 ?F (36.4 ?C)-98.6 ?F (37 ?C)] 98.6 ?F (37 ?C) (03/03 1191) ?Pulse Rate:  [64-89] 86 (03/03 0852) ?Resp:  [14-21] 17 (03/03 4782) ?BP: (113-154)/(41-86) 113/42 (03/03 9562) ?SpO2:  [92 %-100 %] 92 % (03/03 0852) ? ?Intake/Output from previous day: ?03/02 0701 - 03/03 0700 ?In: 2279.7 [P.O.:240; I.V.:1939.7; IV Piggyback:100] ?Out: 180 [Urine:130; Blood:50] ?Intake/Output this shift: ?No intake/output data recorded. ? ?Exam: ? ?No gross blood or drainage overlying the dressing ?2+ radial pulse ?Sensation intact distally in the left hand ?Able to extend the intact EPL, wrist extension, finger abduction, finger adduction, FPL, pronation/supination.  Bicep, tricep, deltoid strength has not returned yet due to block ?No significant drainage in the wound VAC canister ? ?Labs: ?Recent Labs  ?  11/17/21 ?0145  ?HGB 10.7*  ? ?Recent Labs  ?  11/17/21 ?0145  ?WBC 9.6  ?RBC 3.64*  ?HCT 33.2*  ?PLT 254  ? ?Recent Labs  ?  11/17/21 ?0145  ?NA 132*  ?K 3.8  ?CL 98  ?CO2 23  ?BUN 13  ?CREATININE 1.34*  ?GLUCOSE 158*  ?CALCIUM 8.5*  ? ?No results for input(s): LABPT, INR in the last 72 hours. ? ?Assessment/Plan: ?Pt is POD 1 s/p left RSA   ? -Plan to discharge to home today or tomorrow pending patient's pain ? -No lifting with the operative arm ? -When she discharges home, she will need portable Prevena VAC.  She will follow-up in 1 week for clinical recheck with Dr. August Saucer and removal of the Prevena wound VAC sponge. ? -With her history of postoperative infection from other surgeries, plan to finish up multiple  doses of postop antibiotics.  Last dose should finish around 530 or 6 PM today.  Plan to call her around 5 PM to see if she is ready for discharge home.  She was able to work well with therapy today and by her report, no dizziness or lightheadedness with ambulation. ?  ? ? ?Joycie Peek Mylasia Vorhees ?11/17/2021, 12:10 PM  ? ? ?   ?

## 2021-11-17 NOTE — Evaluation (Signed)
Occupational Therapy Evaluation ?Patient Details ?Name: Marilyn [name] ?MRN: [id number] ?DOB: 01/30/1971 ?Today's Date: 11/17/2021 ? ? ?History of Present Illness Pt admitted for L reverse TSA. PMH: arthritis, HTN, hypothyroidism, depression, anxiety, morbid obesity, back sx, B TKA, L hip TKA, L rotator cuff repair.  ? ?Clinical Impression ?  ?Pt functions modified independently at baseline. She walks with a cane and sits to shower. Pt with nerve block partially intact with no pain. She ambulated pushing IV pole with assist for wound vac. Pt educated in sling use and wearing schedule, L shoulder pendulums, AROM L elbow to hand, positioning L UE in bed and chair and compensatory strategies for ADLs. Reinforced with written handouts. Called [employer] for extension of waist strap for shoulder immobilizer.  Pt verbalized and/or demonstrated understanding of all information.  ?   ? ?Recommendations for follow up therapy are one component of a multi-disciplinary discharge planning process, led by the attending physician.  Recommendations may be updated based on patient status, additional functional criteria and insurance authorization.  ? ?Follow Up Recommendations ? Follow physician's recommendations for discharge plan and follow up therapies  ?  ?Assistance Recommended at Discharge PRN  ?Patient can return home with the following A little help with bathing/dressing/bathroom;Assistance with cooking/housework ? ?  ?Functional Status Assessment ? Patient has had a recent decline in their functional status and demonstrates the ability to make significant improvements in function in a reasonable and predictable amount of time.  ?Equipment Recommendations ? None recommended by OT  ?  ?Recommendations for Other Services   ? ? ?  ?Precautions / Restrictions Precautions ?Precautions: Shoulder ?Type of Shoulder Precautions: ok for L pendulums, AROM elbow to hand ?Shoulder Interventions: Shoulder sling/immobilizer;Off for  dressing/bathing/exercises ?Precaution Booklet Issued: Yes (comment) ?Required Braces or Orthoses: Sling ?Restrictions ?Weight Bearing Restrictions: Yes ?LUE Weight Bearing: Non weight bearing  ? ?  ? ?Mobility Bed Mobility ?Overal bed mobility: Modified Independent ?  ?  ?  ?  ?  ?  ?General bed mobility comments: instructed to get out to R side of bed to avoid weight bearing on L UE, pt may sleep in her recliner ?  ? ?Transfers ?Overall transfer level: Modified independent ?  ?  ?  ?  ?  ?  ?  ?  ?General transfer comment: pushed IV pole ?  ? ?  ?Balance   ?  ?  ?  ?  ?  ?  ?  ?  ?  ?  ?  ?  ?  ?  ?  ?  ?  ?  ?   ? ?ADL either performed or assessed with clinical judgement  ? ?ADL Overall ADL's : Needs assistance/impaired ?Eating/Feeding: Independent;Sitting ?  ?Grooming: Modified independent;Standing ?  ?Upper Body Bathing: Minimal assistance;Sitting ?Upper Body Bathing Details (indicate cue type and reason): educated in compensatory strategies, recommended long handled bath sponge for back ?Lower Body Bathing: Minimal assistance;Sitting/lateral leans ?  ?Upper Body Dressing : Minimal assistance;Sitting ?Upper Body Dressing Details (indicate cue type and reason): instructed in compensatory strategies and donning and doffing sling, instructed in sling wearing schedule ?Lower Body Dressing: Set up;Sit to/from stand ?Lower Body Dressing Details (indicate cue type and reason): educated in safe footwear ?Toilet Transfer: Modified Independent ?Toilet Transfer Details (indicate cue type and reason): pushed IV pole ?Toileting- Clothing Manipulation and Hygiene: Modified independent;Sit to/from stand ?  ?  ?  ?Functional mobility during ADLs: Modified independent ?   ? ? ? ?Vision Baseline Vision/History:  1 Wears glasses ?Ability to See in Adequate Light: 0 Adequate ?Patient Visual Report: No change from baseline ?   ?   ?Perception   ?  ?Praxis   ?  ? ?Pertinent Vitals/Pain Pain Assessment ?Pain Assessment: No/denies pain   ? ? ? ?Hand Dominance Right ?  ?Extremity/Trunk Assessment Upper Extremity Assessment ?Upper Extremity Assessment: LUE deficits/detail ?LUE Deficits / Details: Educated in shoulder pendulums, AROM elbow to hand and no AROM of shoulder precaution ?LUE: Unable to fully assess due to immobilization ?LUE Sensation:  (nerve block partially intact) ?LUE Coordination: decreased gross motor ?  ?Lower Extremity Assessment ?Lower Extremity Assessment: Overall WFL for tasks assessed ?  ?Cervical / Trunk Assessment ?Cervical / Trunk Assessment: Other exceptions (increased body habitus) ?  ?Communication Communication ?Communication: No difficulties ?  ?Cognition Arousal/Alertness: Awake/alert ?Behavior During Therapy: [facility] for tasks assessed/performed ?Overall Cognitive Status: Within Functional Limits for tasks assessed ?  ?  ?  ?  ?  ?  ?  ?  ?  ?  ?  ?  ?  ?  ?  ?  ?  ?  ?  ?General Comments    ? ?  ?Exercises   ?  ?Shoulder Instructions    ? ? ?Home Living Family/patient expects to be discharged to:: Private residence ?Living Arrangements: Parent ?Available Help at Discharge: Family;Available 24 hours/day ?Type of Home: House ?  ?  ?  ?  ?  ?  ?Bathroom Shower/Tub: Tub/shower unit ?  ?  ?  ?  ?Home Equipment: Gilmer Mor - single point;Shower seat ?  ?  ?  ? ?  ?Prior Functioning/Environment Prior Level of Function : Independent/Modified Independent ?  ?  ?  ?  ?  ?  ?Mobility Comments: walks with a cane ?ADLs Comments: sits to shower, mostly wears dresses and slip on shoes ?  ? ?  ?  ?OT Problem List:   ?  ?   ?OT Treatment/Interventions:    ?  ?OT Goals(Current goals can be found in the care plan section)    ?OT Frequency:   ?  ? ?Co-evaluation   ?  ?  ?  ?  ? ?  ?AM-PAC OT "6 Clicks" Daily Activity     ?Outcome Measure Help from another person eating meals?: None ?Help from another person taking care of personal grooming?: None ?Help from another person toileting, which includes using toliet, bedpan, or urinal?: None ?Help from  another person bathing (including washing, rinsing, drying)?: A Little ?Help from another person to put on and taking off regular upper body clothing?: A Little ?Help from another person to put on and taking off regular lower body clothing?: A Little ?6 Click Score: 21 ?  ?End of Session Equipment Utilized During Treatment: Other (comment) (sling) ? ?Activity Tolerance: Patient tolerated treatment well ?Patient left: in chair;with call bell/phone within reach ? ?OT Visit Diagnosis: Muscle weakness (generalized) (M62.81)  ?              ?Time: 2725-3664 ?OT Time Calculation (min): 46 min ?Charges:  OT General Charges ?$OT Visit: 1 Visit ?OT Evaluation ?$OT Eval Moderate Complexity: 1 Mod ?OT Treatments ?$Self Care/Home Management : 8-22 mins ?$Therapeutic Exercise: 8-22 mins ? ?Martie Round, OTR/L ?Acute Rehabilitation Services ?Pager: [phone] ?Office: [phone]  ?Evern Bio ?11/17/2021, 10:45 AM ?

## 2021-11-17 NOTE — Plan of Care (Signed)

## 2021-11-17 NOTE — Progress Notes (Signed)
PT Cancellation Note ? ?Patient Details ?Name: SULA FETTERLY ?MRN: [id number] ?DOB: Nov 20, 1970 ? ? ?Cancelled Treatment:    Reason Eval/Treat Not Completed: PT screened, no needs identified per Occupational Therapy, will sign off. ? ?Ina Homes, PT, DPT ?Acute Rehabilitation Services  ?Pager [phone] ?Office [phone] ? ?Malachy Chamber ?11/17/2021, 10:37 AM ?

## 2021-11-18 DIAGNOSIS — M19012 Primary osteoarthritis, left shoulder: Secondary | ICD-10-CM

## 2021-11-18 LAB — CBC
HCT: 33 % — ABNORMAL LOW (ref 36.0–46.0)
Hemoglobin: 10.1 g/dL — ABNORMAL LOW (ref 12.0–15.0)
MCH: 28.1 pg (ref 26.0–34.0)
MCHC: 30.6 g/dL (ref 30.0–36.0)
MCV: 91.9 fL (ref 80.0–100.0)
Platelets: 231 10*3/uL (ref 150–400)
RBC: 3.59 MIL/uL — ABNORMAL LOW (ref 3.87–5.11)
RDW: 14.8 % (ref 11.5–15.5)
WBC: 7.8 10*3/uL (ref 4.0–10.5)
nRBC: 0 % (ref 0.0–0.2)

## 2021-11-18 MED ORDER — OXYCODONE HCL 5 MG PO TABS: 5.0000 mg | ORAL_TABLET | ORAL | 0 refills | Status: DC | PRN

## 2021-11-18 MED ORDER — ASPIRIN 81 MG PO TBEC: 81.0000 mg | DELAYED_RELEASE_TABLET | Freq: Every day | ORAL | 0 refills | Status: DC

## 2021-11-18 NOTE — Plan of Care (Signed)

## 2021-11-18 NOTE — Progress Notes (Signed)
Discharge instructions (including medications) discussed with and copy provided to patient at this time.  CVC removed by IV team and patient remained in bed until 1323.  Wound vac changed out to [place] and patient verbalized understanding to follow up Friday to have removed.  Verbalized understanding of medications and prescriptions to be picked up.  Son in room.  Patient assisted with dressing by NT.   ?

## 2021-11-20 ENCOUNTER — Encounter (HOSPITAL_COMMUNITY): Payer: Self-pay | Admitting: Orthopedic Surgery

## 2021-11-20 ENCOUNTER — Other Ambulatory Visit: Payer: Self-pay | Admitting: Surgical

## 2021-11-20 ENCOUNTER — Telehealth: Payer: Self-pay | Admitting: Surgical

## 2021-11-20 ENCOUNTER — Telehealth: Payer: Self-pay | Admitting: Orthopedic Surgery

## 2021-11-20 ENCOUNTER — Telehealth: Payer: Self-pay | Admitting: Radiology

## 2021-11-20 MED ORDER — OXYCODONE HCL 10 MG PO TABS: 10.0000 mg | ORAL_TABLET | ORAL | 0 refills | Status: DC | PRN

## 2021-11-20 NOTE — Telephone Encounter (Signed)
Patient came in to office today because wound vac had been alarming, leak. Vac not alarming in office. Luke reinforced adhesive. Patient was reminded to charge vac. Patient spoke with Franky Macho about pain meds not helping. Luke to send in short dose of Oxycodone 10mg . ? ?Please send to pharmacy. ?

## 2021-11-20 NOTE — Telephone Encounter (Signed)
Please call Joe or Katie (pharmacist) called requesting a call back. Joe states they are out of oxycodone 10 mg. They need to speak with Dr. August Saucer or PA Franky Macho for changes. Please call [phone]. ?

## 2021-11-20 NOTE — Telephone Encounter (Signed)
Patient called. She would like hydrocodone called in.  ?

## 2021-11-20 NOTE — Telephone Encounter (Signed)
Pt called and requesting her oxycodone 10mg  go to [facility] [place] where they have it in stock. Pt asking for a call back at [phone]. ?

## 2021-11-20 NOTE — Telephone Encounter (Signed)
Sent in rx.

## 2021-11-20 NOTE — Telephone Encounter (Signed)
See previous notes. Franky Macho assisting patient with this.  ?

## 2021-11-24 ENCOUNTER — Ambulatory Visit (INDEPENDENT_AMBULATORY_CARE_PROVIDER_SITE_OTHER): Payer: Medicare Other | Admitting: Surgical

## 2021-11-24 ENCOUNTER — Ambulatory Visit (HOSPITAL_COMMUNITY)
Admission: RE | Admit: 2021-11-24 | Discharge: 2021-11-24 | Disposition: A | Discharge status: Home / Self Care | Payer: Medicare Other | Source: Ambulatory Visit | Attending: Surgical | Admitting: Surgical

## 2021-11-24 ENCOUNTER — Encounter: Payer: Self-pay | Admitting: Surgical

## 2021-11-24 ENCOUNTER — Other Ambulatory Visit: Payer: Self-pay

## 2021-11-24 ENCOUNTER — Encounter: Payer: Medicare Other | Admitting: Surgical

## 2021-11-24 ENCOUNTER — Ambulatory Visit (INDEPENDENT_AMBULATORY_CARE_PROVIDER_SITE_OTHER): Payer: Medicare Other

## 2021-11-24 DIAGNOSIS — Z9889 Other specified postprocedural states: Secondary | ICD-10-CM

## 2021-11-24 DIAGNOSIS — Z96612 Presence of left artificial shoulder joint: Secondary | ICD-10-CM

## 2021-11-24 MED ORDER — IOHEXOL 350 MG/ML SOLN
75.0000 mL | Freq: Once | INTRAVENOUS | Status: AC | PRN
  Administered 2021-11-24: 75 mL via INTRAVENOUS

## 2021-11-24 NOTE — Progress Notes (Signed)
Post-Op Visit Note   Patient: Marilyn [name]           Date of Birth: [birth year]           MRN: [id number] Visit Date: 11/24/2021 PCP: Rebecka Apley, NP   Assessment & Plan:  Chief Complaint:  Chief Complaint  Patient presents with   Left Shoulder - Post-op Follow-up   Visit Diagnoses:  1. Status post reverse arthroplasty of left shoulder   2. Post-operative state     Plan: Patient is a 51 year old female who presents s/p left reverse shoulder arthroplasty on 11/16/2021.  Patient is here for wound VAC removal.  She states that her pain is moderate but controlled.  She has not had any fevers, chills, night sweats, drainage from the incision that she has noticed.  Denies any chest pain but does note some shortness of breath that is new since leaving the hospital.  She has noticed this increasing over the last several days.  She gets short of breath just walking from the car into the office today.  She had vitals checked today that showed blood pressure of 138/80 with heart rate of 73 and a pulse ox of 94%.  She states her pulse oximetry usually runs around 98 to 99%.  Prevena wound VAC was removed today and the incision looks to be healing well proximally and distally but there is a portion in the midportion of the incision that is slightly indurated with some surrounding erythema and some expressible drainage.  This drainage is mostly bloody and does not appear grossly purulent.  Picture is attached in the chart.  Radiographs today show reverse shoulder prosthesis in good position and alignment without any complicating features.  Major concern today is over the new development of shortness of breath with ambulation.  Plan is to order CTA of the chest for further evaluation of pulmonary embolus.  She does have recent GFR of 48 and creatinine of around 1.3 and spoke with the CT department who states that this is within limits to be okay for contrast and they use a reduced dose of  the contrast routinely for this order anyway.  Surgery was applied to the incision except for the area of redness which was left open.  Overall incision covered with an Aquacel dressing.  She will follow-up with the office for further evaluation by Dr. August Saucer in clinic next Thursday at her regular scheduled postop appointment.  Instructed her to call the office on-call number if she notices increasing drainage or develops anything like fevers, chills, night sweats or other signs or symptoms of infection.     Follow-Up Instructions: No follow-ups on file.   Orders:  Orders Placed This Encounter  Procedures   XR Shoulder Left   CT Angio Chest Pulmonary Embolism (PE) W or WO Contrast   No orders of the defined types were placed in this encounter.   Imaging: No results found.  PMFS History: Patient Active Problem List   Diagnosis Date Noted   Arthritis of left shoulder region    S/P reverse total shoulder arthroplasty, left 11/16/2021   Unilateral primary osteoarthritis, right hip 05/09/2021   S/P lumbar spinal fusion 05/09/2021   S/P insertion of spinal cord stimulator 05/09/2021   H/O total knee replacement, bilateral 01/18/2021   Lumbar post-laminectomy syndrome 04/06/2020   Long-term current use of opiate analgesic 11/04/2019   Leg pain, right 10/20/2019   Dysesthesia 10/20/2019   Lateral femoral cutaneous neuropathy, right 10/20/2019  Other spondylosis with radiculopathy, lumbar region    Status post lumbar laminectomy 06/23/2019   C. difficile colitis 05/22/2019   Hypophosphatemia 05/21/2019   Syncope 05/20/2019   OSA on CPAP    Acute gastroenteritis    Anemia due to blood loss, acute 06/18/2018    Class: Acute   Plantar fasciitis of left foot 06/16/2018    Class: Chronic   Tendonitis, Achilles, right 06/16/2018    Class: Chronic   Osteochondral talar dome lesion 06/16/2018    Class: Chronic   Deep incisional surgical site infection    Superficial incisional surgical  site infection 06/11/2018    Class: Acute   Right ankle effusion 06/11/2018    Class: Acute   Morbid (severe) obesity due to excess calories (HCC) 05/29/2018    Class: Chronic   Spondylolisthesis, lumbar region 05/27/2018    Class: Chronic   Spinal stenosis of lumbar region 05/27/2018    Class: Chronic   Urinary tract infection 05/27/2018    Class: Acute   Fusion of spine of lumbar region 05/27/2018   Pain of right hip joint 07/03/2017   Acute right-sided low back pain with right-sided sciatica 03/27/2017   Chronic right shoulder pain 02/13/2017   History of rotator cuff tear 11/30/2016   Impingement syndrome of right shoulder 11/30/2016   Exacerbation of asthma 07/18/2016   Hypokalemia 07/18/2016   Hypertension 07/18/2016   Depression with anxiety 07/18/2016   Hypothyroidism 07/18/2016   Hyperglycemia 07/18/2016   Acute asthma exacerbation 07/18/2016   Surgical wound dehiscence left hip; questionable superficial infection 11/10/2015   Left hip postoperative wound infection 11/10/2015   Osteoarthritis of left hip 09/09/2015   Status post total replacement of left hip 09/09/2015   Obesity (BMI 35.0-39.9 without comorbidity) 04/28/2013   Past Medical History:  Diagnosis Date   Anemia    taking iron now. pt states having no current issues 09/02/2015   Anginal pain (HCC)    pt states experiences chest wall pain pt states related to her asthma    Anxiety    with MRI's   Arthritis    Everywhere   Asthma    Depression    Dizziness    GERD (gastroesophageal reflux disease)    Headache(784.0)    HX OF MIGRAINES   History of bronchitis    Hypertension    Hypothyroidism    takes levothyroxen   OSA on CPAP    wears cpap   Shortness of breath    with exertion   Wears glasses     Family History  Problem Relation Age of Onset   Cancer Mother        colon   Epilepsy Mother    Cancer Father        prostate   Diabetes Father    Hypertension Father    Hypertension  Maternal Aunt    Diabetes Maternal Aunt    Hypertension Paternal Aunt     Past Surgical History:  Procedure Laterality Date   APPLICATION OF WOUND VAC  11/16/2021   Procedure: APPLICATION OF WOUND VAC;  Surgeon: Cammy Copa, MD;  Location: [facility] OR;  Service: Orthopedics;;   BACK SURGERY     CESAREAN SECTION     times 2   CHOLECYSTECTOMY     COLONOSCOPY  2020   ENDOMETRIAL ABLATION     INCISION AND DRAINAGE HIP Left 11/10/2015   Procedure: IRRIGATION AND DEBRIDEMENT LEFT HIP INCISION;  Surgeon: Kathryne Hitch, MD;  Location: [facility] OR;  Service: Orthopedics;  Laterality: Left;   JOINT REPLACEMENT  2011   total left knee   KNEE ARTHROPLASTY  04/23/2012   right    KNEE ARTHROSCOPY     LUMBAR LAMINECTOMY/DECOMPRESSION MICRODISCECTOMY N/A 06/23/2019   Procedure: RIGHT LUMBAR FIVE THROUGH SACRAL ONE PARTIAL HEMILAMINECTOMY WITH RIGHT LUMBAR FIVE FORAMINOTOMY;  Surgeon: Kerrin ChampagneNitka, James E, MD;  Location: [facility] OR;  Service: Orthopedics;  Laterality: N/A;   LUMBAR WOUND DEBRIDEMENT N/A 06/11/2018   Procedure: LUMBAR WOUND DEBRIDEMENT;  Surgeon: Kerrin ChampagneNitka, James E, MD;  Location: [facility];  Service: Orthopedics;  Laterality: N/A;   RADIOLOGY WITH ANESTHESIA N/A 09/09/2018   Procedure: LUMBER SPINE WITHOUT CONTRAST;  Surgeon: Radiologist, Medication, MD;  Location: [facility];  Service: Radiology;  Laterality: N/A;   REVERSE SHOULDER ARTHROPLASTY Left 11/16/2021   Procedure: LEFT REVERSE SHOULDER ARTHROPLASTY;  Surgeon: Cammy Copaean, Gregory Scott, MD;  [facility];  Service: Orthopedics;  Laterality: Left;   ROTATOR CUFF REPAIR     left    SHOULDER SURGERY     right to repair ligament tear   TOTAL HIP ARTHROPLASTY Left 09/09/2015   Procedure: LEFT TOTAL HIP ARTHROPLASTY ANTERIOR APPROACH;  Surgeon: Kathryne Hitchhristopher Y Blackman, MD;  Location: [facility];  Service: Orthopedics;  Laterality: Left;   TOTAL KNEE ARTHROPLASTY  04/23/2012   Procedure: TOTAL KNEE ARTHROPLASTY;  Surgeon: Nadara MustardMarcus V Duda, MD;  Location: [facility];   Service: Orthopedics;  Laterality: Right;  Right Total Knee Arthroplasty   TUBAL LIGATION  1996   Social History   Occupational History   Not on file  Tobacco Use   Smoking status: Former    Packs/day: 0.50    Years: 4.00    Pack years: 2.00    Types: Cigarettes    Quit date: 09/18/1987    Years since quitting: 34.2   Smokeless tobacco: Never  Vaping Use   Vaping Use: Never used  Substance and Sexual Activity   Alcohol use: No   Drug use: No   Sexual activity: Yes    Birth control/protection: Surgical

## 2021-11-26 NOTE — Discharge Summary (Cosign Needed)
Physician Discharge Summary      Patient ID: Marilyn [name] MRN: [id number] DOB/AGE: 1970-11-01 51 y.o.  Admit date: 11/16/2021 Discharge date: 11/18/2021  Admission Diagnoses:  Principal Problem:   S/P reverse total shoulder arthroplasty, left Active Problems:   Arthritis of left shoulder region   Discharge Diagnoses:  Same  Surgeries: Procedure(s): LEFT REVERSE SHOULDER ARTHROPLASTY APPLICATION OF WOUND VAC on 11/16/2021   Consultants:   Discharged Condition: Stable  Hospital Course: Marilyn [name] is an 51 y.o. female who was admitted 11/16/2021 with a chief complaint of left shoulder pain, and found to have a diagnosis of left shoulder osteoarthritis.  They were brought to the operating room on 11/16/2021 and underwent the above named procedures.  Pt awoke from anesthesia without complication and was transferred to the floor. On POD1, patient's pain was controlled initially in the morning.  She was able to mobilize without difficulty.  However, her pain increased as the day went on and her block wore off.  Due to difficulty with pain control, she stayed another night and when pain improved on POD 2, she was subsequently discharged home.  She had no complaint of fever, chills, night sweats, chest pain, shortness of breath..  Pt will f/u with Dr. Marlou Sa in clinic in ~2 weeks.   Antibiotics given:  Anti-infectives (From admission, onward)    Start     Dose/Rate Route Frequency Ordered Stop   11/17/21 1900  ceFAZolin (ANCEF) IVPB 2g/100 mL premix        2 g 200 mL/hr over 30 Minutes Intravenous  Once 11/17/21 1831 11/18/21 1242   11/17/21 0000  ceFAZolin (ANCEF) IVPB 2g/100 mL premix        2 g 200 mL/hr over 30 Minutes Intravenous Every 8 hours 11/16/21 2312 11/18/21 0059   11/16/21 1327  vancomycin (VANCOCIN) powder  Status:  Discontinued          As needed 11/16/21 1327 11/16/21 1709   11/16/21 1030  ceFAZolin (ANCEF) IVPB 3g/100 mL premix        3 g 200 mL/hr over 30  Minutes Intravenous On call to O.R. 11/16/21 1028 11/16/21 1309     .  Recent vital signs:  Vitals:   11/17/21 2217 11/18/21 0655  BP: (!) 119/52 (!) 120/55  Pulse: 74 76  Resp: 17 18  Temp: 98.3 F (36.8 C) 98.6 F (37 C)  SpO2: 100% 100%    Recent laboratory studies:  Results for orders placed or performed during the hospital encounter of 11/16/21  Urine Culture   Specimen: PATH Cytology Urine  Result Value Ref Range   Specimen Description CYSTOSCOPY    Special Requests NONE    Culture      NO GROWTH Performed at [facility] [address], [place], [place] [address]    Report Status 11/17/2021 FINAL   CBC  Result Value Ref Range   WBC 9.6 4.0 - 10.5 K/uL   RBC 3.64 (L) 3.87 - 5.11 MIL/uL   Hemoglobin 10.7 (L) 12.0 - 15.0 g/dL   HCT 33.2 (L) 36.0 - 46.0 %   MCV 91.2 80.0 - 100.0 fL   MCH 29.4 26.0 - 34.0 pg   MCHC 32.2 30.0 - 36.0 g/dL   RDW 14.6 11.5 - 15.5 %   Platelets 254 150 - 400 K/uL   nRBC 0.0 0.0 - 0.2 %  Basic metabolic panel  Result Value Ref Range   Sodium 132 (L) 135 - 145 mmol/L  Potassium 3.8 3.5 - 5.1 mmol/L   Chloride 98 98 - 111 mmol/L   CO2 23 22 - 32 mmol/L   Glucose, Bld 158 (H) 70 - 99 mg/dL   BUN 13 6 - 20 mg/dL   Creatinine, Ser 1.34 (H) 0.44 - 1.00 mg/dL   Calcium 8.5 (L) 8.9 - 10.3 mg/dL   GFR, Estimated 48 (L) >60 mL/min   Anion gap 11 5 - 15  CBC  Result Value Ref Range   WBC 7.8 4.0 - 10.5 K/uL   RBC 3.59 (L) 3.87 - 5.11 MIL/uL   Hemoglobin 10.1 (L) 12.0 - 15.0 g/dL   HCT 33.0 (L) 36.0 - 46.0 %   MCV 91.9 80.0 - 100.0 fL   MCH 28.1 26.0 - 34.0 pg   MCHC 30.6 30.0 - 36.0 g/dL   RDW 14.8 11.5 - 15.5 %   Platelets 231 150 - 400 K/uL   nRBC 0.0 0.0 - 0.2 %  Pregnancy, urine POC  Result Value Ref Range   Preg Test, Ur NEGATIVE NEGATIVE    Discharge Medications:   Allergies as of 11/18/2021       Reactions   Lisinopril Anaphylaxis   Angioedema   Bee Venom Swelling, Other (See Comments)   Swelling at the site    Bupropion Swelling   Propoxyphene Hives   Codeine Nausea Only   Latex Rash   Meloxicam Other (See Comments)   Insomnia, constipation   Morphine And Related Rash   Tomato Rash        Medication List     STOP taking these medications    ciprofloxacin 500 MG tablet Commonly known as: Cipro   oxyCODONE-acetaminophen 7.5-325 MG tablet Commonly known as: PERCOCET   traMADol 50 MG tablet Commonly known as: ULTRAM   zolpidem 10 MG tablet Commonly known as: AMBIEN       TAKE these medications    albuterol 108 (90 Base) MCG/ACT inhaler Commonly known as: VENTOLIN HFA Inhale 1-2 puffs into the lungs every 6 (six) hours as needed for wheezing or shortness of breath.   allopurinol 100 MG tablet Commonly known as: ZYLOPRIM Take 1 tablet by mouth twice daily   ascorbic Acid 500 MG Cpcr Commonly known as: VITAMIN C Take 500 mg by mouth daily. Gummy   aspirin 81 MG EC tablet Take 1 tablet (81 mg total) by mouth daily. Swallow whole.   buPROPion 300 MG 24 hr tablet Commonly known as: WELLBUTRIN XL Take 300 mg by mouth daily.   cetirizine 10 MG tablet Commonly known as: ZYRTEC Take 10 mg by mouth daily.   clotrimazole-betamethasone cream Commonly known as: LOTRISONE Apply 1 application topically daily as needed (irritation).   cyclobenzaprine 10 MG tablet Commonly known as: FLEXERIL Take 10 mg by mouth 3 (three) times daily as needed for muscle spasms.   cycloSPORINE 0.05 % ophthalmic emulsion Commonly known as: RESTASIS Place 1 drop into both eyes 2 (two) times daily as needed (dry eyes).   DULoxetine 60 MG capsule Commonly known as: CYMBALTA Take 60 mg by mouth daily.   Ferrous Gluconate 324 (37.5 Fe) MG Tabs Take 324 mg by mouth daily.   furosemide 20 MG tablet Commonly known as: LASIX Take 20 mg by mouth daily.   gabapentin 400 MG capsule Commonly known as: NEURONTIN Take 400 mg by mouth 2 (two) times daily.   hydrochlorothiazide 25 MG  tablet Commonly known as: HYDRODIURIL Take 25 mg by mouth daily.   levothyroxine 150 MCG tablet  Commonly known as: SYNTHROID Take 150 mcg by mouth daily before breakfast.   Misc. Devices Misc Below the knee compression hose style calf Extra large size 1 pair 15-20 mmHg    Fax to [facility] in [place], [name]   Narcan 4 MG/0.1ML Liqd nasal spray kit Generic drug: naloxone Place 0.4 mg into the nose as needed (opiate overdose).   oxybutynin 15 MG 24 hr tablet Commonly known as: DITROPAN XL Take 15 mg by mouth daily.   vitamin B-12 250 MCG tablet Commonly known as: CYANOCOBALAMIN Take 250 mcg by mouth daily.   Vitamin D (Ergocalciferol) 1.25 MG (50000 UNIT) Caps capsule Commonly known as: DRISDOL Take 50,000 Units by mouth every Monday.        Diagnostic Studies: CT Angio Chest Pulmonary Embolism (PE) W or WO Contrast  Result Date: 11/24/2021 CLINICAL DATA:  12/09/2019 EXAM: CT ANGIOGRAPHY CHEST WITH CONTRAST TECHNIQUE: Multidetector CT imaging of the chest was performed using the standard protocol during bolus administration of intravenous contrast. Multiplanar CT image reconstructions and MIPs were obtained to evaluate the vascular anatomy. RADIATION DOSE REDUCTION: This exam was performed according to the departmental dose-optimization program which includes automated exposure control, adjustment of the mA and/or kV according to patient size and/or use of iterative reconstruction technique. CONTRAST:  53m OMNIPAQUE IOHEXOL 350 MG/ML SOLN COMPARISON:  None. FINDINGS: Cardiovascular: Heart is enlarged in size. There is homogeneous enhancement in thoracic aorta. Contrast density in peripheral pulmonary artery branches is less than optimal, possibly related to patient's body habitus. There is breathing motion limiting evaluation peripheral pulmonary artery branches. As far as seen, there are no intraluminal filling defects in the central pulmonary artery branches.  Mediastinum/Nodes: No significant lymphadenopathy seen. Lungs/Pleura: There is no focal pulmonary consolidation. There is no pleural effusion or pneumothorax. Breathing motion limits evaluation of lung fields. Upper Abdomen: There is fatty infiltration in the liver. Musculoskeletal: Degenerative changes are noted in the thoracic spine with bony spurs. There is a pain control lead in the thoracic spinal canal. There is left shoulder arthroplasty. Degenerative changes are noted in the right shoulder. Review of the MIP images confirms the above findings. IMPRESSION: Technically limited study due to patient's body habitus and motion artifacts. As far as seen, there is no central pulmonary artery embolism. There is no evidence of thoracic aortic dissection. There is no focal pulmonary consolidation. Fatty liver.  Other findings as described in the body of the report. Electronically Signed   By: PElmer PickerM.D.   On: 11/24/2021 19:39   CT HIP RIGHT W CONTRAST  Result Date: 11/01/2021 CLINICAL DATA:  Intermittent right anterior hip pain for 4 months. EXAM: CT OF THE LOWER RIGHT EXTREMITY WITH CONTRAST TECHNIQUE: Multidetector CT imaging of the lower right extremity was performed according to the standard protocol following intravenous contrast administration. RADIATION DOSE REDUCTION: This exam was performed according to the departmental dose-optimization program which includes automated exposure control, adjustment of the mA and/or kV according to patient size and/or use of iterative reconstruction technique. CONTRAST:  See Injection Documentation COMPARISON:  MR hip 01/28/2016; X-ray hip 08/18/2012. FINDINGS: Bones/Joint/Cartilage This study is markedly limited by the patient's body habitus. No acute bony or joint abnormality is identified. There is right hip osteoarthritis with cartilage thinning and joint space narrowing. Small subchondral cyst is seen in the acetabulum. The labrum is poorly seen but the  anterior, superior labrum has a globular configuration and the anterior labrum appears torn as seen on images 69 and 70 series  8. No lytic or sclerotic lesion is identified. No avascular necrosis of the femoral head. Postoperative change lower lumbar spine is partially visualized. Ligaments Suboptimally assessed by CT. Muscles and Tendons As visualized on this limited CT, no abnormality is seen. Soft tissues Negative. IMPRESSION: Markedly limited study due to the patient's body habitus. There is mild to moderate appearing right hip osteoarthritis and likely degenerative tearing of the anterior and anterior, superior acetabular labrum. Electronically Signed   By: Inge Rise M.D.   On: 11/01/2021 08:29   DG Shoulder Left Port  Result Date: 11/16/2021 CLINICAL DATA:  Status post left shoulder surgery EXAM: LEFT SHOULDER COMPARISON:  08/03/2021 FINDINGS: Changes of left shoulder replacement. No hardware complicating feature. Normal alignment. IMPRESSION: Left shoulder replacement.  No visible complicating feature. Electronically Signed   By: Rolm Baptise M.D.   On: 11/16/2021 17:55   DL FLUORO GUIDED NEEDLE PLC ASPIRATION / INJECTTION/LOC  Result Date: 10/31/2021 CLINICAL DATA:  Right hip pain. EXAM: RIGHT HIP INJECTION UNDER FLUOROSCOPY FLUOROSCOPY: Radiation Exposure Index (as provided by the fluoroscopic device): 4.20 mGy Kerma PROCEDURE: The overlying skin was prepped with Betadine, draped in the usual sterile fashion, and infiltrated locally with 1% lidocaine. A 5 inch 22 gauge spinal needle was advanced to the lateral aspect of the right femoral head-neck junction. 1 mL of 1% lidocaine injected easily. A mixture of 15 mL of Isovue-M 200, and 5 mL of sterile saline was then used to opacify the right femoral head. 11 mL of this mixture were injected. The needle was removed and a sterile dressing was applied. There was no immediate complication. IMPRESSION: Technically successful right hip injection  under fluoroscopy for CT arthrogram. Electronically Signed   By: Logan Bores M.D.   On: 10/31/2021 15:04   XR Shoulder Left  Result Date: 11/24/2021 AP, Grashey, scapular Y views of left shoulder reviewed.  Reverse shoulder prosthesis appears in good position alignment without any complicating features.  No periprosthetic fracture noted.   Disposition: Discharge disposition: 01-Home or Self Care       Discharge Instructions     Call MD / Call 911   Complete by: As directed    If you experience chest pain or shortness of breath, CALL 911 and be transported to the hospital emergency room.  If you develope a fever above 101 F, pus (white drainage) or increased drainage or redness at the wound, or calf pain, call your surgeon's office.   Constipation Prevention   Complete by: As directed    Drink plenty of fluids.  Prune juice may be helpful.  You may use a stool softener, such as Colace (over the counter) 100 mg twice a day.  Use MiraLax (over the counter) for constipation as needed.   Diet - low sodium heart healthy   Complete by: As directed    Discharge instructions   Complete by: As directed    You may shower, dressing is waterproof.  Do not bathe or soak the operative shoulder in a tub, pool.  Use the CPM machine 3 times a day for 1-2 hours each time, increasing the degrees of range of motion daily.  No lifting with the operative shoulder. Continue use of the sling.  Follow-up with Dr. Marlou Sa in ~2 weeks on your given appointment date.  We will remove your adhesive bandage at that time.  Call the office on Monday to schedule interim follow-up appointment for wound vac removal next Friday. Do not take your Ambien with the  Oxycodone.  Call the office with any concerns.  Dental Antibiotics:  In most cases prophylactic antibiotics for Dental procdeures after total joint surgery are not necessary.  Exceptions are as follows:  1. History of prior total joint infection  2. Severely  immunocompromised (Organ Transplant, cancer chemotherapy, Rheumatoid biologic meds such as [place])  3. Poorly controlled diabetes (A1C &gt; 8.0, blood glucose over 200)  If you have one of these conditions, contact your surgeon for an antibiotic prescription, prior to your dental procedure.   Increase activity slowly as tolerated   Complete by: As directed    Post-operative opioid taper instructions:   Complete by: As directed    POST-OPERATIVE OPIOID TAPER INSTRUCTIONS: It is important to wean off of your opioid medication as soon as possible. If you do not need pain medication after your surgery it is ok to stop day one. Opioids include: Codeine, Hydrocodone(Norco, Vicodin), Oxycodone(Percocet, oxycontin) and hydromorphone amongst others.  Long term and even short term use of opiods can cause: Increased pain response Dependence Constipation Depression Respiratory depression And more.  Withdrawal symptoms can include Flu like symptoms Nausea, vomiting And more Techniques to manage these symptoms Hydrate well Eat regular healthy meals Stay active Use relaxation techniques(deep breathing, meditating, yoga) Do Not substitute Alcohol to help with tapering If you have been on opioids for less than two weeks and do not have pain than it is ok to stop all together.  Plan to wean off of opioids This plan should start within one week post op of your joint replacement. Maintain the same interval or time between taking each dose and first decrease the dose.  Cut the total daily intake of opioids by one tablet each day Next start to increase the time between doses. The last dose that should be eliminated is the evening dose.             SignedDonella Stade 11/26/2021, 5:59 PM

## 2021-11-28 ENCOUNTER — Telehealth: Payer: Self-pay | Admitting: Orthopedic Surgery

## 2021-11-28 NOTE — Telephone Encounter (Signed)
IC spoke with patient.  ?Advised mychart message sent to [facility] and he would address once they were out of surgery.  ? ?

## 2021-11-28 NOTE — Telephone Encounter (Signed)
Pt called requesting a message back on mychart concerning wound from surgery. Please message or call pt back at [phone] ?

## 2021-11-28 NOTE — Telephone Encounter (Signed)
I called and spoke with [facility].  She is overall feeling well.  No fevers or chills or any systemic signs of infection.  She has improved shoulder pain compared with last visit on Friday.  Has had some increased drainage over the last day but is not soaking through the dressing.  She sent me new pictures which were reviewed.  Discussed this with Dr. Marlou Sa.  He recommended coming in tomorrow and she was instructed to arrive at 8:15 in the morning.  We will reevaluate her at this time.

## 2021-11-29 ENCOUNTER — Ambulatory Visit (INDEPENDENT_AMBULATORY_CARE_PROVIDER_SITE_OTHER): Payer: Medicare Other | Admitting: Orthopedic Surgery

## 2021-11-29 ENCOUNTER — Other Ambulatory Visit: Payer: Self-pay

## 2021-11-29 ENCOUNTER — Encounter: Payer: Self-pay | Admitting: Orthopedic Surgery

## 2021-11-29 DIAGNOSIS — Z96612 Presence of left artificial shoulder joint: Secondary | ICD-10-CM

## 2021-11-29 NOTE — Progress Notes (Signed)
? ?Post-Op Visit Note ?  ?Patient: Marilyn [name]           ?Date of Birth: May 01, 1971           ?MRN: [id number] ?Visit Date: 11/29/2021 ?PCP: Rebecka ApleyHemberg, Katherine V, NP ? ? ?Assessment & Plan: ? ?Chief Complaint:  ?Chief Complaint  ?Patient presents with  ? Left Shoulder - Routine Post Op  ? ?Visit Diagnoses:  ?1. Status post reverse arthroplasty of left shoulder   ? ? ?Plan: Marilyn [name] is a [age] year old patient underwent left reverse shoulder replacement 11/16/2021.  Had Prevena wound VAC placed at the time of the surgery.  Patient has BMI of over 60.  On examination deltoid fires and she has predictably stiff shoulder.  Incision has 1 spot above the nylon sutures which has opened about a centimeter.  This area does not have any fluctuance but does have slight amount of drainage.  No redness present.  Area is prepped with alcohol and Betadine and then alcohol again and I was able to sink a Q-tip about a centimeter and a half below the skin.  Culture was obtained.  I do not feel a discrete cavity.  Nonetheless based on her history with infections after hip replacement I think it would be prudent to wash the shoulder out.  Potentially plan on long-term antibiotics.  Hold on preop IV antibiotics until we get a culture.  Plan to do this on Friday.  She is not having any systemic symptoms of infection. ? ?Follow-Up Instructions: No follow-ups on file.  ? ?Orders:  ?Orders Placed This Encounter  ?Procedures  ? Anaerobic and Aerobic Culture  ? ?No orders of the defined types were placed in this encounter. ? ? ?Imaging: ?No results found. ? ?PMFS History: ?Patient Active Problem List  ? Diagnosis Date Noted  ? Arthritis of left shoulder region   ? 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 Unilateral primary osteoarthritis, right hip 05/09/2021  ? S/P lumbar spinal fusion 05/09/2021  ? S/P insertion of spinal cord stimulator 05/09/2021  ? H/O total knee replacement, bilateral 01/18/2021  ? Lumbar  post-laminectomy syndrome 04/06/2020  ? Long-term current use of opiate analgesic 11/04/2019  ? Leg pain, right 10/20/2019  ? Dysesthesia 10/20/2019  ? 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 Other spondylosis with radiculopathy, lumbar region   ? Status post lumbar laminectomy 06/23/2019  ? C. difficile colitis 05/22/2019  ? Hypophosphatemia 05/21/2019  ? Syncope 05/20/2019  ? OSA on CPAP   ? Acute gastroenteritis   ? Anemia due to blood loss, acute 06/18/2018  ?  Class: Acute  ? Plantar fasciitis of left foot 06/16/2018  ?  Class: Chronic  ? Tendonitis, Achilles, right 06/16/2018  ?  Class: Chronic  ? Osteochondral talar dome lesion 06/16/2018  ?  Class: Chronic  ? Deep incisional surgical site infection   ? Superficial incisional surgical site infection 06/11/2018  ?  Class: Acute  ? Right ankle effusion 06/11/2018  ?  Class: Acute  ? Morbid (severe) obesity due to excess calories (HCC) 05/29/2018  ?  Class: Chronic  ? Spondylolisthesis, lumbar region 05/27/2018  ?  Class: Chronic  ? Spinal stenosis of lumbar region 05/27/2018  ?  Class: Chronic  ? Urinary tract infection 05/27/2018  ?  Class: Acute  ? Fusion of spine of lumbar region 05/27/2018  ? Pain of right hip joint 07/03/2017  ? Acute right-sided low back pain with right-sided sciatica 03/27/2017  ? Chronic right  shoulder pain 02/13/2017  ? History of rotator cuff tear 11/30/2016  ? Impingement syndrome of right shoulder 11/30/2016  ? Exacerbation of asthma 07/18/2016  ? Hypokalemia 07/18/2016  ? Hypertension 07/18/2016  ? Depression with anxiety 07/18/2016  ? Hypothyroidism 07/18/2016  ? Hyperglycemia 07/18/2016  ? Acute asthma exacerbation 07/18/2016  ? Surgical wound dehiscence left hip; questionable superficial infection 11/10/2015  ? Left hip postoperative wound infection 11/10/2015  ? Osteoarthritis of left hip 09/09/2015  ? Status post total replacement of left hip 09/09/2015  ? Obesity (BMI 35.0-39.9 without comorbidity)  04/28/2013  ? ?Past Medical History:  ?Diagnosis Date  ? Anemia   ? taking iron now. pt states having no current issues 09/02/2015  ? Anginal pain (HCC)   ? pt states experiences chest wall pain pt states related to her asthma   ? Anxiety   ? with MRI's  ? Arthritis   ? Everywhere  ? Asthma   ? Depression   ? Dizziness   ? GERD (gastroesophageal reflux disease)   ? Headache(784.0)   ? HX OF MIGRAINES  ? History of bronchitis   ? Hypertension   ? Hypothyroidism   ? takes levothyroxen  ? OSA on CPAP   ? wears cpap  ? Shortness of breath   ? with exertion  ? Wears glasses   ?  ?Family History  ?Problem Relation Age of Onset  ? Cancer Mother   ?     colon  ? Epilepsy Mother   ? Cancer Father   ?     prostate  ? Diabetes Father   ? Hypertension Father   ? Hypertension Maternal Aunt   ? Diabetes Maternal Aunt   ? Hypertension Paternal Aunt   ?  ?Past Surgical History:  ?Procedure Laterality Date  ? APPLICATION OF WOUND VAC  11/16/2021  ? Procedure: APPLICATION OF WOUND VAC;  Surgeon: Cammy Copa, MD;  Location: [facility] OR;  Service: Orthopedics;;  ? BACK SURGERY    ? CESAREAN SECTION    ? times 2  ? CHOLECYSTECTOMY    ? COLONOSCOPY  2020  ? ENDOMETRIAL ABLATION    ? INCISION AND DRAINAGE HIP Left 11/10/2015  ? Procedure: IRRIGATION AND DEBRIDEMENT LEFT HIP INCISION;  Surgeon: Kathryne Hitch, MD;  Location: [facility] OR;  Service: Orthopedics;  Laterality: Left;  ? JOINT REPLACEMENT  2011  ? total left knee  ? KNEE ARTHROPLASTY  04/23/2012  ? right   ? KNEE ARTHROSCOPY    ? LUMBAR LAMINECTOMY/DECOMPRESSION MICRODISCECTOMY N/A 06/23/2019  ? Procedure: RIGHT LUMBAR FIVE THROUGH SACRAL ONE PARTIAL HEMILAMINECTOMY WITH RIGHT LUMBAR FIVE FORAMINOTOMY;  Surgeon: Kerrin Champagne, MD;  Location: [facility] OR;  Service: Orthopedics;  Laterality: N/A;  ? LUMBAR WOUND DEBRIDEMENT N/A 06/11/2018  ? Procedure: LUMBAR WOUND DEBRIDEMENT;  Surgeon: Kerrin Champagne, MD;  Location: [facility] OR;  Service: Orthopedics;  Laterality: N/A;  ? RADIOLOGY WITH  ANESTHESIA N/A 09/09/2018  ? Procedure: LUMBER SPINE WITHOUT CONTRAST;  Surgeon: Radiologist, Medication, MD;  Location: [facility] OR;  Service: Radiology;  Laterality: N/A;  ? REVERSE SHOULDER ARTHROPLASTY Left 11/16/2021  ? Procedure: LEFT REVERSE SHOULDER ARTHROPLASTY;  Surgeon: Cammy Copa, MD;  Location: [facility] OR;  Service: Orthopedics;  Laterality: Left;  ? ROTATOR CUFF REPAIR    ? left   ? SHOULDER SURGERY    ? right to repair ligament tear  ? TOTAL HIP ARTHROPLASTY Left 09/09/2015  ? Procedure: LEFT TOTAL HIP ARTHROPLASTY ANTERIOR APPROACH;  Surgeon: Kathryne Hitch,  MD;  Location: [facility] ORS;  Service: Orthopedics;  Laterality: Left;  ? TOTAL KNEE ARTHROPLASTY  04/23/2012  ? Procedure: TOTAL KNEE ARTHROPLASTY;  Surgeon: [name], MD;  Location: [facility] OR;  Service: Orthopedics;  Laterality: Right;  Right Total Knee Arthroplasty  ? TUBAL LIGATION  1996  ? ?Social History  ? ?Occupational History  ? Not on file  ?Tobacco Use  ? Smoking status: Former  ?  Packs/day: 0.50  ?  Years: 4.00  ?  Pack years: 2.00  ?  Types: Cigarettes  ?  Quit date: 09/18/1987  ?  Years since quitting: 34.2  ? Smokeless tobacco: Never  ?Vaping Use  ? Vaping Use: Never used  ?Substance and Sexual Activity  ? Alcohol use: No  ? Drug use: No  ? Sexual activity: Yes  ?  Birth control/protection: Surgical  ? ? ? ?

## 2021-11-30 ENCOUNTER — Encounter (HOSPITAL_COMMUNITY): Payer: Self-pay | Admitting: Orthopedic Surgery

## 2021-11-30 ENCOUNTER — Encounter: Payer: Medicare Other | Admitting: Orthopedic Surgery

## 2021-11-30 ENCOUNTER — Other Ambulatory Visit: Payer: Self-pay

## 2021-11-30 NOTE — Progress Notes (Signed)
?  Anesthesia review: n/a ? ?------------- ? ?SDW INSTRUCTIONS: ? ?Your procedure is scheduled on Friday 3/17. Please report to [facility] Main Entrance "A" at 12:00 AP.M., and check in at the Admitting office. Call this number if you have problems the morning of surgery: [phone] ? ? ?Remember: Do not eat after midnight the night before your surgery ? ?You may drink clear liquids until 11:00 AM the morning of your surgery.   ?Clear liquids allowed are: Water, Non-Citrus Juices (without pulp), Carbonated Beverages, Clear Tea, Black Coffee Only, and Gatorade (do Not add anything to drinks)  ?  ?Medications to take morning of surgery with a sip of water include: ?allopurinol (ZYLOPRIM)  ?buPROPion (WELLBUTRIN XL)  ?cetirizine (ZYRTEC)  ?cyclobenzaprine (FLEXERIL)  ?DULoxetine (CYMBALTA)  ?gabapentin (NEURONTIN)  ?levothyroxine (SYNTHROID)  ?Oxycodone HCl if needed ? ?As of today, STOP taking any Aleve, Naproxen, Ibuprofen, Motrin, Advil, Goody's, BC's, all herbal medications, fish oil, and all vitamins. ? ?  ?The Morning of Surgery ?Do not wear jewelry, make-up or nail polish. ?Do not wear lotions, powders, or perfumes, or deodorant ?Do not bring valuables to the hospital. ?[facility] is not responsible for any belongings or valuables. ? ?If you are a smoker, DO NOT Smoke 24 hours prior to surgery ? ?If you wear a CPAP at night please bring your mask the morning of surgery  ? ?Remember that you must have someone to transport you home after your surgery, and remain with you for 24 hours if you are discharged the same day. ? ?Please bring cases for contacts, glasses, hearing aids, dentures or bridgework because it cannot be worn into surgery.  ? ?Patients discharged the day of surgery will not be allowed to drive home.  ? ?Please shower the NIGHT BEFORE/MORNING OF SURGERY (use antibacterial soap like DIAL soap if possible). Wear comfortable clothes the morning of surgery. Oral Hygiene is also important to reduce  your risk of infection.  Remember - BRUSH YOUR TEETH THE MORNING OF SURGERY WITH YOUR REGULAR TOOTHPASTE ? ?Patient denies shortness of breath, fever, cough and chest pain.  ? ? ?   ? ?

## 2021-12-01 ENCOUNTER — Other Ambulatory Visit: Payer: Self-pay

## 2021-12-01 ENCOUNTER — Ambulatory Visit (HOSPITAL_COMMUNITY): Payer: Medicare Other | Admitting: Anesthesiology

## 2021-12-01 ENCOUNTER — Other Ambulatory Visit (HOSPITAL_COMMUNITY): Payer: Medicare Other

## 2021-12-01 ENCOUNTER — Encounter (HOSPITAL_COMMUNITY): Payer: Self-pay | Admitting: Orthopedic Surgery

## 2021-12-01 ENCOUNTER — Inpatient Hospital Stay (HOSPITAL_COMMUNITY)
Admission: RE | Admit: 2021-12-01 | Discharge: 2021-12-08 | DRG: 857 | Disposition: A | Discharge status: Home / Self Care | Payer: Medicare Other | Attending: Orthopedic Surgery | Admitting: Orthopedic Surgery

## 2021-12-01 ENCOUNTER — Encounter (HOSPITAL_COMMUNITY)
Admission: RE | Disposition: A | Discharge status: Home / Self Care | Payer: Self-pay | Source: Home / Self Care | Attending: Orthopedic Surgery

## 2021-12-01 DIAGNOSIS — T8141XA Infection following a procedure, superficial incisional surgical site, initial encounter: Secondary | ICD-10-CM

## 2021-12-01 DIAGNOSIS — Z888 Allergy status to other drugs, medicaments and biological substances status: Secondary | ICD-10-CM

## 2021-12-01 DIAGNOSIS — Z96612 Presence of left artificial shoulder joint: Secondary | ICD-10-CM | POA: Diagnosis present

## 2021-12-01 DIAGNOSIS — Z800 Family history of malignant neoplasm of digestive organs: Secondary | ICD-10-CM

## 2021-12-01 DIAGNOSIS — Z886 Allergy status to analgesic agent status: Secondary | ICD-10-CM | POA: Diagnosis not present

## 2021-12-01 DIAGNOSIS — F32A Depression, unspecified: Secondary | ICD-10-CM | POA: Diagnosis present

## 2021-12-01 DIAGNOSIS — Z8042 Family history of malignant neoplasm of prostate: Secondary | ICD-10-CM

## 2021-12-01 DIAGNOSIS — T8149XA Infection following a procedure, other surgical site, initial encounter: Secondary | ICD-10-CM

## 2021-12-01 DIAGNOSIS — D649 Anemia, unspecified: Secondary | ICD-10-CM | POA: Diagnosis present

## 2021-12-01 DIAGNOSIS — Z79890 Hormone replacement therapy (postmenopausal): Secondary | ICD-10-CM

## 2021-12-01 DIAGNOSIS — I10 Essential (primary) hypertension: Secondary | ICD-10-CM | POA: Diagnosis present

## 2021-12-01 DIAGNOSIS — Z8249 Family history of ischemic heart disease and other diseases of the circulatory system: Secondary | ICD-10-CM

## 2021-12-01 DIAGNOSIS — D638 Anemia in other chronic diseases classified elsewhere: Secondary | ICD-10-CM

## 2021-12-01 DIAGNOSIS — Z833 Family history of diabetes mellitus: Secondary | ICD-10-CM

## 2021-12-01 DIAGNOSIS — Z8619 Personal history of other infectious and parasitic diseases: Secondary | ICD-10-CM | POA: Diagnosis not present

## 2021-12-01 DIAGNOSIS — G4730 Sleep apnea, unspecified: Secondary | ICD-10-CM | POA: Diagnosis not present

## 2021-12-01 DIAGNOSIS — S41002A Unspecified open wound of left shoulder, initial encounter: Secondary | ICD-10-CM | POA: Diagnosis not present

## 2021-12-01 DIAGNOSIS — Z96642 Presence of left artificial hip joint: Secondary | ICD-10-CM | POA: Diagnosis present

## 2021-12-01 DIAGNOSIS — Z981 Arthrodesis status: Secondary | ICD-10-CM

## 2021-12-01 DIAGNOSIS — G4733 Obstructive sleep apnea (adult) (pediatric): Secondary | ICD-10-CM | POA: Diagnosis present

## 2021-12-01 DIAGNOSIS — Z87891 Personal history of nicotine dependence: Secondary | ICD-10-CM

## 2021-12-01 DIAGNOSIS — Z7982 Long term (current) use of aspirin: Secondary | ICD-10-CM | POA: Diagnosis not present

## 2021-12-01 DIAGNOSIS — Z820 Family history of epilepsy and other diseases of the nervous system: Secondary | ICD-10-CM

## 2021-12-01 DIAGNOSIS — Z96653 Presence of artificial knee joint, bilateral: Secondary | ICD-10-CM | POA: Diagnosis present

## 2021-12-01 DIAGNOSIS — E039 Hypothyroidism, unspecified: Secondary | ICD-10-CM | POA: Diagnosis present

## 2021-12-01 DIAGNOSIS — Z885 Allergy status to narcotic agent status: Secondary | ICD-10-CM | POA: Diagnosis not present

## 2021-12-01 DIAGNOSIS — Z91040 Latex allergy status: Secondary | ICD-10-CM

## 2021-12-01 DIAGNOSIS — Z6841 Body Mass Index (BMI) 40.0 and over, adult: Secondary | ICD-10-CM

## 2021-12-01 DIAGNOSIS — Z79899 Other long term (current) drug therapy: Secondary | ICD-10-CM

## 2021-12-01 HISTORY — PX: INCISION AND DRAINAGE: SHX5863

## 2021-12-01 HISTORY — PX: APPLICATION OF WOUND VAC: SHX5189

## 2021-12-01 LAB — SURGICAL PCR SCREEN
MRSA, PCR: NEGATIVE
Staphylococcus aureus: NEGATIVE

## 2021-12-01 SURGERY — INCISION AND DRAINAGE
Anesthesia: General | Site: Shoulder | Laterality: Left

## 2021-12-01 MED ORDER — SODIUM CHLORIDE 0.9 % IV SOLN: INTRAVENOUS | Status: AC

## 2021-12-01 MED ORDER — PROMETHAZINE HCL 25 MG/ML IJ SOLN: 6.2500 mg | INTRAMUSCULAR | Status: DC | PRN

## 2021-12-01 MED ORDER — VANCOMYCIN HCL 1000 MG IV SOLR
INTRAVENOUS | Status: DC | PRN
  Administered 2021-12-01: 1000 mg via TOPICAL

## 2021-12-01 MED ORDER — ASPIRIN EC 81 MG PO TBEC
81.0000 mg | DELAYED_RELEASE_TABLET | Freq: Every day | ORAL | Status: DC
  Administered 2021-12-01 – 2021-12-08 (×7): 81 mg via ORAL
  Filled 2021-12-01 (×8): qty 1

## 2021-12-01 MED ORDER — MIDAZOLAM HCL 2 MG/2ML IJ SOLN
INTRAMUSCULAR | Status: AC
  Filled 2021-12-01: qty 2

## 2021-12-01 MED ORDER — LIDOCAINE 2% (20 MG/ML) 5 ML SYRINGE
INTRAMUSCULAR | Status: DC | PRN
  Administered 2021-12-01: 20 mg via INTRAVENOUS

## 2021-12-01 MED ORDER — PROPOFOL 10 MG/ML IV BOLUS
INTRAVENOUS | Status: AC
  Filled 2021-12-01: qty 20

## 2021-12-01 MED ORDER — VANCOMYCIN HCL 1500 MG/300ML IV SOLN
1500.0000 mg | INTRAVENOUS | Status: AC
Start: 2021-12-01 — End: 2021-12-01
  Administered 2021-12-01: 1500 mg via INTRAVENOUS
  Filled 2021-12-01: qty 300

## 2021-12-01 MED ORDER — CLONIDINE HCL (ANALGESIA) 100 MCG/ML EP SOLN
EPIDURAL | Status: AC
  Filled 2021-12-01: qty 10

## 2021-12-01 MED ORDER — HYDROMORPHONE HCL 1 MG/ML IJ SOLN
0.2500 mg | INTRAMUSCULAR | Status: DC | PRN
  Administered 2021-12-01: 0.5 mg via INTRAVENOUS

## 2021-12-01 MED ORDER — MEPERIDINE HCL 25 MG/ML IJ SOLN: 6.2500 mg | INTRAMUSCULAR | Status: DC | PRN

## 2021-12-01 MED ORDER — POVIDONE-IODINE 10 % EX SWAB
2.0000 "application " | Freq: Once | CUTANEOUS | Status: AC
  Administered 2021-12-01: 2 via TOPICAL

## 2021-12-01 MED ORDER — CHLORHEXIDINE GLUCONATE 0.12 % MT SOLN
OROMUCOSAL | Status: AC
  Filled 2021-12-01: qty 15

## 2021-12-01 MED ORDER — METHOCARBAMOL 1000 MG/10ML IJ SOLN
500.0000 mg | Freq: Four times a day (QID) | INTRAVENOUS | Status: DC | PRN
  Filled 2021-12-01: qty 5

## 2021-12-01 MED ORDER — LEVOTHYROXINE SODIUM 75 MCG PO TABS
150.0000 ug | ORAL_TABLET | Freq: Every day | ORAL | Status: DC
  Administered 2021-12-02 – 2021-12-08 (×6): 150 ug via ORAL
  Filled 2021-12-01: qty 1
  Filled 2021-12-01: qty 2
  Filled 2021-12-01: qty 1
  Filled 2021-12-01: qty 2
  Filled 2021-12-01 (×4): qty 1

## 2021-12-01 MED ORDER — PHENOL 1.4 % MT LIQD: 1.0000 | OROMUCOSAL | Status: DC | PRN

## 2021-12-01 MED ORDER — BUPROPION HCL ER (XL) 150 MG PO TB24
300.0000 mg | ORAL_TABLET | Freq: Every day | ORAL | Status: DC
  Administered 2021-12-02 – 2021-12-08 (×7): 300 mg via ORAL
  Filled 2021-12-01: qty 1
  Filled 2021-12-01 (×2): qty 2
  Filled 2021-12-01 (×4): qty 1

## 2021-12-01 MED ORDER — ONDANSETRON HCL 4 MG/2ML IJ SOLN
INTRAMUSCULAR | Status: DC | PRN
  Administered 2021-12-01: 4 mg via INTRAVENOUS

## 2021-12-01 MED ORDER — BUPIVACAINE HCL (PF) 0.5 % IJ SOLN
INTRAMUSCULAR | Status: AC
  Filled 2021-12-01: qty 30

## 2021-12-01 MED ORDER — MORPHINE SULFATE (PF) 4 MG/ML IV SOLN
INTRAVENOUS | Status: AC
  Filled 2021-12-01: qty 2

## 2021-12-01 MED ORDER — ONDANSETRON HCL 4 MG/2ML IJ SOLN
4.0000 mg | Freq: Four times a day (QID) | INTRAMUSCULAR | Status: DC | PRN
  Filled 2021-12-01: qty 2

## 2021-12-01 MED ORDER — CHLORHEXIDINE GLUCONATE 4 % EX LIQD: 60.0000 mL | Freq: Once | CUTANEOUS | Status: DC

## 2021-12-01 MED ORDER — HYDROMORPHONE HCL 1 MG/ML IJ SOLN
0.5000 mg | INTRAMUSCULAR | Status: DC | PRN
  Administered 2021-12-01 – 2021-12-06 (×7): 0.5 mg via INTRAVENOUS
  Filled 2021-12-01 (×7): qty 0.5

## 2021-12-01 MED ORDER — SUGAMMADEX SODIUM 200 MG/2ML IV SOLN
INTRAVENOUS | Status: DC | PRN
  Administered 2021-12-01 (×2): 500 mg via INTRAVENOUS

## 2021-12-01 MED ORDER — MIDAZOLAM HCL 2 MG/2ML IJ SOLN: 2.0000 mg | Freq: Once | INTRAMUSCULAR | Status: AC

## 2021-12-01 MED ORDER — SODIUM CHLORIDE 0.9 % IR SOLN
Status: DC | PRN
  Administered 2021-12-01: 6000 mL

## 2021-12-01 MED ORDER — DEXAMETHASONE SODIUM PHOSPHATE 10 MG/ML IJ SOLN
INTRAMUSCULAR | Status: DC | PRN
  Administered 2021-12-01: 10 mg via INTRAVENOUS

## 2021-12-01 MED ORDER — TRANEXAMIC ACID-NACL 1000-0.7 MG/100ML-% IV SOLN
INTRAVENOUS | Status: AC
  Filled 2021-12-01: qty 100

## 2021-12-01 MED ORDER — CEFAZOLIN IN SODIUM CHLORIDE 3-0.9 GM/100ML-% IV SOLN
3.0000 g | INTRAVENOUS | Status: AC
Start: 2021-12-01 — End: 2021-12-01
  Administered 2021-12-01: 3 g via INTRAVENOUS
  Filled 2021-12-01: qty 100

## 2021-12-01 MED ORDER — ROCURONIUM BROMIDE 10 MG/ML (PF) SYRINGE
PREFILLED_SYRINGE | INTRAVENOUS | Status: AC
  Filled 2021-12-01: qty 10

## 2021-12-01 MED ORDER — HYDROMORPHONE HCL 1 MG/ML IJ SOLN
INTRAMUSCULAR | Status: AC
  Administered 2021-12-01: 0.5 mg via INTRAVENOUS
  Filled 2021-12-01: qty 1

## 2021-12-01 MED ORDER — 0.9 % SODIUM CHLORIDE (POUR BTL) OPTIME
TOPICAL | Status: DC | PRN
  Administered 2021-12-01: 1000 mL

## 2021-12-01 MED ORDER — OXYBUTYNIN CHLORIDE ER 15 MG PO TB24
15.0000 mg | ORAL_TABLET | Freq: Every day | ORAL | Status: DC
Start: 2021-12-02 — End: 2021-12-08
  Administered 2021-12-02 – 2021-12-08 (×7): 15 mg via ORAL
  Filled 2021-12-01 (×7): qty 1

## 2021-12-01 MED ORDER — OXYCODONE HCL 5 MG PO TABS: 5.0000 mg | ORAL_TABLET | Freq: Once | ORAL | Status: DC | PRN

## 2021-12-01 MED ORDER — VANCOMYCIN HCL 1000 MG IV SOLR
INTRAVENOUS | Status: AC
  Filled 2021-12-01: qty 20

## 2021-12-01 MED ORDER — FENTANYL CITRATE (PF) 250 MCG/5ML IJ SOLN
INTRAMUSCULAR | Status: DC | PRN
Start: 2021-12-01 — End: 2021-12-03
  Administered 2021-12-01: 50 ug via INTRAVENOUS

## 2021-12-01 MED ORDER — CHLORHEXIDINE GLUCONATE 0.12 % MT SOLN
15.0000 mL | Freq: Once | OROMUCOSAL | Status: AC
  Administered 2021-12-01: 15 mL via OROMUCOSAL

## 2021-12-01 MED ORDER — OXYCODONE HCL 5 MG/5ML PO SOLN: 5.0000 mg | Freq: Once | ORAL | Status: DC | PRN

## 2021-12-01 MED ORDER — HYDROCHLOROTHIAZIDE 25 MG PO TABS
25.0000 mg | ORAL_TABLET | Freq: Every day | ORAL | Status: DC
  Administered 2021-12-02 – 2021-12-08 (×7): 25 mg via ORAL
  Filled 2021-12-01 (×7): qty 1

## 2021-12-01 MED ORDER — OXYCODONE HCL 5 MG PO TABS
10.0000 mg | ORAL_TABLET | ORAL | Status: DC | PRN
  Administered 2021-12-02 – 2021-12-05 (×9): 10 mg via ORAL
  Filled 2021-12-01 (×11): qty 2

## 2021-12-01 MED ORDER — FENTANYL CITRATE (PF) 250 MCG/5ML IJ SOLN
INTRAMUSCULAR | Status: AC
  Filled 2021-12-01: qty 5

## 2021-12-01 MED ORDER — AMISULPRIDE (ANTIEMETIC) 5 MG/2ML IV SOLN: 10.0000 mg | Freq: Once | INTRAVENOUS | Status: DC | PRN

## 2021-12-01 MED ORDER — ACETAMINOPHEN 500 MG PO TABS
1000.0000 mg | ORAL_TABLET | Freq: Four times a day (QID) | ORAL | Status: AC
  Administered 2021-12-01 – 2021-12-02 (×4): 1000 mg via ORAL
  Filled 2021-12-01 (×4): qty 2

## 2021-12-01 MED ORDER — MENTHOL 3 MG MT LOZG: 1.0000 | LOZENGE | OROMUCOSAL | Status: DC | PRN

## 2021-12-01 MED ORDER — GABAPENTIN 400 MG PO CAPS
400.0000 mg | ORAL_CAPSULE | Freq: Two times a day (BID) | ORAL | Status: DC
  Administered 2021-12-02 – 2021-12-08 (×13): 400 mg via ORAL
  Filled 2021-12-01 (×13): qty 1

## 2021-12-01 MED ORDER — CEFAZOLIN SODIUM-DEXTROSE 2-4 GM/100ML-% IV SOLN
2.0000 g | Freq: Three times a day (TID) | INTRAVENOUS | Status: AC
  Administered 2021-12-02 (×3): 2 g via INTRAVENOUS
  Filled 2021-12-01 (×3): qty 100

## 2021-12-01 MED ORDER — DULOXETINE HCL 60 MG PO CPEP
60.0000 mg | ORAL_CAPSULE | Freq: Every day | ORAL | Status: DC
  Administered 2021-12-02 – 2021-12-08 (×7): 60 mg via ORAL
  Filled 2021-12-01 (×7): qty 1

## 2021-12-01 MED ORDER — ALBUTEROL SULFATE (2.5 MG/3ML) 0.083% IN NEBU: 2.5000 mg | INHALATION_SOLUTION | Freq: Four times a day (QID) | RESPIRATORY_TRACT | Status: DC | PRN

## 2021-12-01 MED ORDER — ONDANSETRON HCL 4 MG PO TABS: 4.0000 mg | ORAL_TABLET | Freq: Four times a day (QID) | ORAL | Status: DC | PRN

## 2021-12-01 MED ORDER — METOCLOPRAMIDE HCL 5 MG/ML IJ SOLN: 5.0000 mg | Freq: Three times a day (TID) | INTRAMUSCULAR | Status: DC | PRN

## 2021-12-01 MED ORDER — DOCUSATE SODIUM 100 MG PO CAPS
100.0000 mg | ORAL_CAPSULE | Freq: Two times a day (BID) | ORAL | Status: DC
  Administered 2021-12-01 – 2021-12-08 (×13): 100 mg via ORAL
  Filled 2021-12-01 (×13): qty 1

## 2021-12-01 MED ORDER — CEFAZOLIN SODIUM-DEXTROSE 2-4 GM/100ML-% IV SOLN
INTRAVENOUS | Status: AC
  Filled 2021-12-01: qty 200

## 2021-12-01 MED ORDER — ALLOPURINOL 100 MG PO TABS
100.0000 mg | ORAL_TABLET | Freq: Two times a day (BID) | ORAL | Status: DC
  Administered 2021-12-02 – 2021-12-08 (×13): 100 mg via ORAL
  Filled 2021-12-01 (×13): qty 1

## 2021-12-01 MED ORDER — METOCLOPRAMIDE HCL 5 MG PO TABS: 5.0000 mg | ORAL_TABLET | Freq: Three times a day (TID) | ORAL | Status: DC | PRN

## 2021-12-01 MED ORDER — ONDANSETRON HCL 4 MG/2ML IJ SOLN
INTRAMUSCULAR | Status: AC
  Filled 2021-12-01: qty 2

## 2021-12-01 MED ORDER — LIDOCAINE 2% (20 MG/ML) 5 ML SYRINGE
INTRAMUSCULAR | Status: AC
  Filled 2021-12-01: qty 5

## 2021-12-01 MED ORDER — ACETAMINOPHEN 325 MG PO TABS
325.0000 mg | ORAL_TABLET | Freq: Four times a day (QID) | ORAL | Status: DC | PRN
  Administered 2021-12-03 (×2): 650 mg via ORAL
  Filled 2021-12-01 (×2): qty 2

## 2021-12-01 MED ORDER — METHOCARBAMOL 500 MG PO TABS
500.0000 mg | ORAL_TABLET | Freq: Four times a day (QID) | ORAL | Status: DC | PRN
  Administered 2021-12-02 – 2021-12-08 (×6): 500 mg via ORAL
  Filled 2021-12-01 (×6): qty 1

## 2021-12-01 MED ORDER — MORPHINE SULFATE 4 MG/ML IJ SOLN
INTRAMUSCULAR | Status: DC | PRN
  Administered 2021-12-01: 30 mL via INTRAMUSCULAR

## 2021-12-01 MED ORDER — LACTATED RINGERS IV SOLN: INTRAVENOUS | Status: DC

## 2021-12-01 MED ORDER — PROPOFOL 10 MG/ML IV BOLUS
INTRAVENOUS | Status: DC | PRN
  Administered 2021-12-01: 200 mg via INTRAVENOUS

## 2021-12-01 MED ORDER — ROCURONIUM BROMIDE 10 MG/ML (PF) SYRINGE
PREFILLED_SYRINGE | INTRAVENOUS | Status: DC | PRN
  Administered 2021-12-01: 60 mg via INTRAVENOUS

## 2021-12-01 MED ORDER — ORAL CARE MOUTH RINSE: 15.0000 mL | Freq: Once | OROMUCOSAL | Status: AC

## 2021-12-01 MED ORDER — TRANEXAMIC ACID-NACL 1000-0.7 MG/100ML-% IV SOLN
INTRAVENOUS | Status: DC | PRN
  Administered 2021-12-01: 1000 mg via INTRAVENOUS

## 2021-12-01 MED ORDER — PHENYLEPHRINE HCL-NACL 20-0.9 MG/250ML-% IV SOLN
INTRAVENOUS | Status: DC | PRN
  Administered 2021-12-01: 50 ug/min via INTRAVENOUS

## 2021-12-01 MED ORDER — MIDAZOLAM HCL 2 MG/2ML IJ SOLN
INTRAMUSCULAR | Status: AC
  Administered 2021-12-01: 1 mg via INTRAVENOUS
  Filled 2021-12-01: qty 2

## 2021-12-01 MED ORDER — DEXAMETHASONE SODIUM PHOSPHATE 10 MG/ML IJ SOLN
INTRAMUSCULAR | Status: AC
  Filled 2021-12-01: qty 3

## 2021-12-01 SURGICAL SUPPLY — 49 items
ALCOHOL 70% 16 OZ (MISCELLANEOUS) ×2 IMPLANT
BAG COUNTER SPONGE SURGICOUNT (BAG) ×2 IMPLANT
BLADE SURG 11 STRL SS (BLADE) ×2 IMPLANT
CANISTER WOUNDNEG PRESSURE 500 (CANNISTER) ×1 IMPLANT
CASSETTE VERAFLO VERALINK (MISCELLANEOUS) ×1 IMPLANT
DRAPE IMP U-DRAPE 54X76 (DRAPES) ×2 IMPLANT
DRAPE INCISE IOBAN 66X45 STRL (DRAPES) ×3 IMPLANT
DRAPE STERI 35X30 U-POUCH (DRAPES) ×2 IMPLANT
DRAPE U-SHAPE 47X51 STRL (DRAPES) ×4 IMPLANT
DRESSING VERAFLO CLEANSE CC (GAUZE/BANDAGES/DRESSINGS) IMPLANT
DRSG CLEANSE VERAFLOW (GAUZE/BANDAGES/DRESSINGS) ×1 IMPLANT
DRSG PAD ABDOMINAL 8X10 ST (GAUZE/BANDAGES/DRESSINGS) ×1 IMPLANT
DRSG VERAFLO CLEANSE CC (GAUZE/BANDAGES/DRESSINGS) ×2
DURAPREP 26ML APPLICATOR (WOUND CARE) ×3 IMPLANT
ELECT REM PT RETURN 9FT ADLT (ELECTROSURGICAL) ×2
ELECTRODE REM PT RTRN 9FT ADLT (ELECTROSURGICAL) ×1 IMPLANT
GAUZE XEROFORM 1X8 LF (GAUZE/BANDAGES/DRESSINGS) ×2 IMPLANT
GLOVE SRG 8 PF TXTR STRL LF DI (GLOVE) ×1 IMPLANT
GLOVE SURG ENC TEXT LTX SZ6.5 (GLOVE) ×2 IMPLANT
GLOVE SURG LTX SZ8 (GLOVE) ×2 IMPLANT
GLOVE SURG POLYISO LF SZ6.5 (GLOVE) ×3 IMPLANT
GLOVE SURG UNDER POLY LF SZ6.5 (GLOVE) ×2 IMPLANT
GLOVE SURG UNDER POLY LF SZ8 (GLOVE) ×2
GOWN STRL REUS W/ TWL LRG LVL3 (GOWN DISPOSABLE) ×3 IMPLANT
GOWN STRL REUS W/TWL LRG LVL3 (GOWN DISPOSABLE) ×6
HANDPIECE INTERPULSE COAX TIP (DISPOSABLE) ×2
IV NS IRRIG 3000ML ARTHROMATIC (IV SOLUTION) ×2 IMPLANT
IV SODIUM CHL 0.9% 250ML (IV SOLUTION) ×1 IMPLANT
KIT BASIN OR (CUSTOM PROCEDURE TRAY) ×2 IMPLANT
KIT TURNOVER KIT B (KITS) ×2 IMPLANT
MANIFOLD NEPTUNE II (INSTRUMENTS) ×3 IMPLANT
NDL HYPO 25X1 1.5 SAFETY (NEEDLE) ×1 IMPLANT
NDL SPNL 18GX3.5 QUINCKE PK (NEEDLE) ×1 IMPLANT
NEEDLE HYPO 25X1 1.5 SAFETY (NEEDLE) ×2 IMPLANT
NEEDLE SPNL 18GX3.5 QUINCKE PK (NEEDLE) ×2 IMPLANT
NS IRRIG 1000ML POUR BTL (IV SOLUTION) ×2 IMPLANT
PACK SHOULDER (CUSTOM PROCEDURE TRAY) ×2 IMPLANT
PAD ARMBOARD 7.5X6 YLW CONV (MISCELLANEOUS) ×6 IMPLANT
RESTRAINT HEAD UNIVERSAL NS (MISCELLANEOUS) ×2 IMPLANT
SET HNDPC FAN SPRY TIP SCT (DISPOSABLE) IMPLANT
SLING ARM FOAM STRAP XLG (SOFTGOODS) ×1 IMPLANT
SPONGE T-LAP 18X18 ~~LOC~~+RFID (SPONGE) ×2 IMPLANT
SPONGE T-LAP 4X18 ~~LOC~~+RFID (SPONGE) ×4 IMPLANT
STOCKINETTE IMPERVIOUS LG (DRAPES) ×1 IMPLANT
SWAB COLLECTION DEVICE MRSA (MISCELLANEOUS) ×2 IMPLANT
SWAB CULTURE ESWAB REG 1ML (MISCELLANEOUS) ×2 IMPLANT
TOWEL GREEN STERILE (TOWEL DISPOSABLE) ×2 IMPLANT
TOWEL GREEN STERILE FF (TOWEL DISPOSABLE) ×2 IMPLANT
WATER STERILE IRR 1000ML POUR (IV SOLUTION) ×2 IMPLANT

## 2021-12-01 NOTE — Brief Op Note (Signed)
? ?  12/01/2021 ? ?6:35 PM ? ?PATIENT:  Marilyn [name]  51 y.o. female ? ?PRE-OPERATIVE DIAGNOSIS:  left shoulder superficial infection ? ?POST-OPERATIVE DIAGNOSIS:  same ? ?PROCEDURE:  Procedure(s): ?LEFT SHOULDER INCISION & DRAINAGE ? ?SURGEON:  Surgeon(s): ?Cammy Copa, MD ? ?ASSISTANT: magnant pa ? ?ANESTHESIA:   general ? ?EBL: 100 ml   ? ?Total I/O ?In: 100 [IV Piggyback:100] ?Out: -  ? ?BLOOD ADMINISTERED: none ? ?DRAINS:  veraflow wound vac   ? ?LOCAL MEDICATIONS USED:  marcaine mso4 clonidine ?SPECIMEN:  cultures x 2 ? ?COUNTS:  YES ? ?TOURNIQUET:  * No tourniquets in log * ? ?DICTATION: .Other Dictation: Dictation Number [id number] ? ?PLAN OF CARE: Admit to inpatient  ? ?PATIENT DISPOSITION:  PACU - hemodynamically stable ? ? ? ? ? ? ? ? ? ? ? ? ?  ?

## 2021-12-01 NOTE — Plan of Care (Signed)
  Problem: Education: Goal: Knowledge of General Education information will improve Description: Including pain rating scale, medication(s)/side effects and non-pharmacologic comfort measures Outcome: Progressing   Problem: Pain Managment: Goal: General experience of comfort will improve Outcome: Progressing   Problem: Skin Integrity: Goal: Risk for impaired skin integrity will decrease Outcome: Progressing   

## 2021-12-01 NOTE — Anesthesia Procedure Notes (Signed)
Procedure Name: Intubation ?Date/Time: 12/01/2021 4:53 PM ?Performed by: Eligha Bridegroom, CRNA ?Pre-anesthesia Checklist: Patient identified, Emergency Drugs available, Suction available, Patient being monitored and Timeout performed ?Patient Re-evaluated:Patient Re-evaluated prior to induction ?Preoxygenation: Pre-oxygenation with 100% oxygen ?Induction Type: IV induction ?Ventilation: Mask ventilation without difficulty and Oral airway inserted - appropriate to patient size ?Laryngoscope Size: Mac and 4 ?Grade View: Grade I ?Tube type: Oral ?Tube size: 7.0 mm ?Airway Equipment and Method: Stylet ?Placement Confirmation: ETT inserted through vocal cords under direct vision, positive ETCO2 and breath sounds checked- equal and bilateral ?Secured at: 22 cm ?Tube secured with: Tape ?Dental Injury: Teeth and Oropharynx as per pre-operative assessment  ? ? ? ? ?

## 2021-12-01 NOTE — Op Note (Signed)
NAME: Marilyn [name], Marilyn M. ?MEDICAL RECORD NO: [id number] ?ACCOUNT NO: [id number] ?DATE OF BIRTH: April 13, 1971 ?FACILITY: [facility] ?LOCATION: [facility] ?PHYSICIAN: Graylin Shiver. August Saucer, MD ? ?Operative Report  ? ?DATE OF PROCEDURE: 12/01/2021 ? ?PREOPERATIVE DIAGNOSIS:  Superficial infection, left shoulder. ? ?POSTOPERATIVE DIAGNOSIS:  Superficial infection, left shoulder. ? ?PROCEDURE:  Incisional debridement and excisional debridement of left shoulder superficial infection and placement of VeraFlo wound VAC. ? ?SURGEON:  Graylin Shiver. August Saucer, MD ? ?ASSISTANT:  Karenann Cai. ? ?INDICATIONS:  The patient is a 51 year old patient with left shoulder pain two weeks following reverse shoulder replacement.  She has a small wound in the central aspect of the incision.  She presents now for operative management after explanation of  ?risks and benefits. ? ?DESCRIPTION OF PROCEDURE:  The patient was brought to the operating room where general anesthetic was induced.  Preoperative antibiotics were not administered until after cultures were obtained.  Left shoulder was prescrubbed with hydrogen peroxide,  ?alcohol and Betadine, which allowed to air dry, then prepped with DuraPrep solution and draped in a sterile manner.  [name] was used to seal the operative field.  Timeout was called.  An incision was made about 2 cm proximal and distal to the 1 cm  ?draining sinus tract.  There was space underneath the sinus tract, which required exploration.  Incision was expanded about 5 cm proximally and 4 cm distally, essentially the length of the incision.  Necrotic appearing fat was present.  There was fluid  ?present.  This was sent for culture and sensitivity.  At this point, excisional debridement was performed both with a knife and a curette and rongeurs.  The arm was placed through range of motion and there was no efflux through the deltopectoral closure, ? so this was a superficial infection.  Following thorough excisional debridement, 6 liters of  pulsatile irrigating solution was utilized.  This resulted in a very healthy appearing tissue all around the incision.  Next, vancomycin powder placed within  ?the incision and the VeraFlo was then placed within the incision.  This was then sealed.  Good seal was obtained.  The arm was then placed into a sling.  The patient tolerated the procedure well without immediate complication.  Luke's assistance was  ?required for arm positioning, opening, closing, debridement.  His assistance was a medical necessity.  Plan is for delayed closure with Dr. Lajoyce Corners in approximately 3-5 days. ? ? ?VAI ?D: 12/01/2021 6:49:05 pm T: 12/01/2021 10:29:00 pm  ?JOB: [id number]/ [id number]  ?

## 2021-12-01 NOTE — H&P (Signed)
Marilyn [name] is an 51 y.o. female.   ?Chief Complaint: left shoulder pain ?HPI: Marilyn [name] is a 51 year old patient underwent left reverse shoulder replacement 11/16/2021.  Had Prevena wound VAC placed at the time of the surgery.  Patient has BMI of over 60.  On examination deltoid fires and she has predictably stiff shoulder.  Incision has 1 spot above the nylon sutures which has opened about a centimeter.  This area does not have any fluctuance but does have slight amount of drainage.  No redness present.  Area is prepped with alcohol and Betadine and then alcohol again and I was able to sink a Q-tip about a centimeter and a half below the skin.  Culture was obtained.  I do not feel a discrete cavity.  Nonetheless based on her history with infections after hip replacement I think it would be prudent to wash the shoulder out.  Potentially plan on long-term antibiotics.  Hold on preop IV antibiotics until we get a culture.  Plan to do this on Friday.  She is not having any systemic symptoms of infection. ? ?Past Medical History:  ?Diagnosis Date  ? Anemia   ? taking iron now. pt states having no current issues 09/02/2015  ? Anginal pain ([place])   ? pt states experiences chest wall pain pt states related to her asthma   ? Anxiety   ? with MRI's  ? Arthritis   ? Everywhere  ? Asthma   ? Depression   ? Dizziness   ? GERD (gastroesophageal reflux disease)   ? Headache(784.0)   ? HX OF MIGRAINES  ? History of bronchitis   ? Hypertension   ? Hypothyroidism   ? takes levothyroxen  ? OSA on CPAP   ? wears cpap  ? Shortness of breath   ? with exertion  ? Wears glasses   ? ? ?Past Surgical History:  ?Procedure Laterality Date  ? APPLICATION OF WOUND VAC  11/16/2021  ? Procedure: APPLICATION OF WOUND VAC;  Surgeon: Meredith Pel, MD;  Location: [place];  Service: Orthopedics;;  ? BACK SURGERY    ? CESAREAN SECTION    ? times 2  ? CHOLECYSTECTOMY    ? COLONOSCOPY  2020  ? ENDOMETRIAL ABLATION    ? INCISION AND DRAINAGE HIP Left  11/10/2015  ? Procedure: IRRIGATION AND DEBRIDEMENT LEFT HIP INCISION;  Surgeon: Mcarthur Rossetti, MD;  Location: [place];  Service: Orthopedics;  Laterality: Left;  ? JOINT REPLACEMENT  2011  ? total left knee  ? KNEE ARTHROPLASTY  04/23/2012  ? right   ? KNEE ARTHROSCOPY    ? LUMBAR LAMINECTOMY/DECOMPRESSION MICRODISCECTOMY N/A 06/23/2019  ? Procedure: RIGHT LUMBAR FIVE THROUGH SACRAL ONE PARTIAL HEMILAMINECTOMY WITH RIGHT LUMBAR FIVE FORAMINOTOMY;  Surgeon: Jessy Oto, MD;  Location: [place];  Service: Orthopedics;  Laterality: N/A;  ? LUMBAR WOUND DEBRIDEMENT N/A 06/11/2018  ? Procedure: LUMBAR WOUND DEBRIDEMENT;  Surgeon: Jessy Oto, MD;  Location: [place];  Service: Orthopedics;  Laterality: N/A;  ? RADIOLOGY WITH ANESTHESIA N/A 09/09/2018  ? Procedure: LUMBER SPINE WITHOUT CONTRAST;  Surgeon: Radiologist, Medication, MD;  Location: [place];  Service: Radiology;  Laterality: N/A;  ? REVERSE SHOULDER ARTHROPLASTY Left 11/16/2021  ? Procedure: LEFT REVERSE SHOULDER ARTHROPLASTY;  Surgeon: Meredith Pel, MD;  Location: [place];  Service: Orthopedics;  Laterality: Left;  ? ROTATOR CUFF REPAIR    ? left   ? SHOULDER SURGERY    ? right to repair ligament tear  ?  TOTAL HIP ARTHROPLASTY Left 09/09/2015  ? Procedure: LEFT TOTAL HIP ARTHROPLASTY ANTERIOR APPROACH;  Surgeon: Mcarthur Rossetti, MD;  Location: [facility];  Service: Orthopedics;  Laterality: Left;  ? TOTAL KNEE ARTHROPLASTY  04/23/2012  ? Procedure: TOTAL KNEE ARTHROPLASTY;  Surgeon: Newt Minion, MD;  Location: [place];  Service: Orthopedics;  Laterality: Right;  Right Total Knee Arthroplasty  ? TUBAL LIGATION  1996  ? ? ?Family History  ?Problem Relation Age of Onset  ? Cancer Mother   ?     colon  ? Epilepsy Mother   ? Cancer Father   ?     prostate  ? Diabetes Father   ? Hypertension Father   ? Hypertension Maternal Aunt   ? Diabetes Maternal Aunt   ? Hypertension Paternal Aunt   ? ?Social History:  reports that she quit smoking about 34 years  ago. Her smoking use included cigarettes. She has a 2.00 pack-year smoking history. She has never used smokeless tobacco. She reports that she does not drink alcohol and does not use drugs. ? ?Allergies:  ?Allergies  ?Allergen Reactions  ? Lisinopril Anaphylaxis  ?  Angioedema  ? Bee Venom Swelling and Other (See Comments)  ?  Swelling at the site  ? Bupropion Swelling  ? Latex Itching and Rash  ? Propoxyphene Hives  ? Codeine Nausea Only  ? Meloxicam Other (See Comments)  ?  Insomnia, constipation  ? Morphine And Related Rash  ? Tegaderm Ag Mesh [Silver] Hives, Itching and Rash  ? Tomato Rash  ? ? ?Medications Prior to Admission  ?Medication Sig Dispense Refill  ? allopurinol (ZYLOPRIM) 100 MG tablet Take 1 tablet by mouth twice daily 180 tablet 0  ? ascorbic Acid (VITAMIN C) 500 MG CPCR Take 500 mg by mouth daily. Gummy    ? aspirin EC 81 MG EC tablet Take 1 tablet (81 mg total) by mouth daily. Swallow whole. 30 tablet 0  ? buPROPion (WELLBUTRIN XL) 300 MG 24 hr tablet Take 300 mg by mouth daily.    ? cetirizine (ZYRTEC) 10 MG tablet Take 10 mg by mouth daily.    ? cyclobenzaprine (FLEXERIL) 10 MG tablet Take 10 mg by mouth 3 (three) times daily as needed for muscle spasms.    ? DULoxetine (CYMBALTA) 60 MG capsule Take 60 mg by mouth daily.    ? Ferrous Gluconate 324 (37.5 Fe) MG TABS Take 324 mg by mouth daily.    ? furosemide (LASIX) 20 MG tablet Take 20 mg by mouth daily.    ? gabapentin (NEURONTIN) 400 MG capsule Take 400 mg by mouth 2 (two) times daily.    ? hydrochlorothiazide (HYDRODIURIL) 25 MG tablet Take 25 mg by mouth daily.    ? levothyroxine (SYNTHROID) 150 MCG tablet Take 150 mcg by mouth daily before breakfast.    ? oxybutynin (DITROPAN XL) 15 MG 24 hr tablet Take 15 mg by mouth daily.     ? Oxycodone HCl 10 MG TABS Take 1 tablet (10 mg total) by mouth every 4 (four) hours as needed. 25 tablet 0  ? vitamin B-12 (CYANOCOBALAMIN) 250 MCG tablet Take 250 mcg by mouth daily.    ? Vitamin D,  Ergocalciferol, (DRISDOL) 1.25 MG (50000 UNIT) CAPS capsule Take 50,000 Units by mouth every Monday.    ? albuterol (VENTOLIN HFA) 108 (90 Base) MCG/ACT inhaler Inhale 1-2 puffs into the lungs every 6 (six) hours as needed for wheezing or shortness of breath. 8 g 0  ?  clotrimazole-betamethasone (LOTRISONE) cream Apply 1 application topically daily as needed (irritation).    ? cycloSPORINE (RESTASIS) 0.05 % ophthalmic emulsion Place 1 drop into both eyes 2 (two) times daily as needed (dry eyes).    ? Misc. Devices MISC Below the knee compression hose style calf ?Extra large size ?1 pair ?15-20 mmHg ? ? ? ?Fax to [employer] in [place], [name]    ? [facility] 4 MG/0.1ML LIQD nasal spray kit Place 0.4 mg into the nose as needed (opiate overdose).     ? ? ?No results found for this or any previous visit (from the past 48 hour(s)). ?No results found. ? ?Review of Systems  ?Musculoskeletal:  Positive for arthralgias.  ?All other systems reviewed and are negative. ? ?Blood pressure (!) 138/55, pulse 72, temperature 98.1 ?F (36.7 ?C), temperature source Oral, resp. rate 17, height '5\' 3"'  (1.6 m), weight 124.7 kg, SpO2 100 %. ?Physical Exam ?Vitals reviewed.  ?HENT:  ?   Head: Normocephalic.  ?   Nose: Nose normal.  ?   Mouth/Throat:  ?   Mouth: Mucous membranes are moist.  ?Eyes:  ?   Pupils: Pupils are equal, round, and reactive to light.  ?Cardiovascular:  ?   Rate and Rhythm: Normal rate.  ?   Pulses: Normal pulses.  ?Pulmonary:  ?   Effort: Pulmonary effort is normal.  ?Abdominal:  ?   General: Abdomen is flat.  ?Musculoskeletal:  ?   Cervical back: Normal range of motion.  ?Skin: ?   General: Skin is warm.  ?   Capillary Refill: Capillary refill takes less than 2 seconds.  ?Neurological:  ?   General: No focal deficit present.  ?   Mental Status: She is alert.  ?Psychiatric:     ?   Mood and Affect: Mood normal.  ?Left shoulder demonstrates healed surgical incision proximally.  Distally nylon sutures are in place.  Above  the nylon sutures there is gapping about 1 cm in the midportion of the incision.  No frank fluctuance induration or drainage under pressure.  No proximal lymphadenopathy. ? ?Assessment/Plan ?Impression is superfici

## 2021-12-01 NOTE — Anesthesia Preprocedure Evaluation (Signed)
Anesthesia Evaluation  ?Patient identified by MRN, date of birth, ID band ?Patient awake ? ? ? ?Reviewed: ?Allergy & Precautions, H&P , NPO status , Patient's Chart, lab work & pertinent test results ? ?Airway ?Mallampati: III ? ?TM Distance: >3 FB ?Neck ROM: Full ? ? ? Dental ?no notable dental hx. ?(+) Teeth Intact, Dental Advisory Given ?  ?Pulmonary ?shortness of breath and with exertion, asthma , sleep apnea , former smoker,  ?  ?Pulmonary exam normal ?breath sounds clear to auscultation ? ? ? ? ? ? Cardiovascular ?hypertension, Pt. on medications ? ?Rhythm:Regular Rate:Normal ? ? ?  ?Neuro/Psych ? Headaches, Anxiety Depression   ? GI/Hepatic ?Neg liver ROS, GERD  ,  ?Endo/Other  ?Hypothyroidism Morbid obesity ? Renal/GU ?negative Renal ROS  ?negative genitourinary ?  ?Musculoskeletal ? ?(+) Arthritis , Osteoarthritis,   ? Abdominal ?  ?Peds ? Hematology ? ?(+) Blood dyscrasia, anemia ,   ?Anesthesia Other Findings ? ? Reproductive/Obstetrics ?negative OB ROS ? ?  ? ? ? ? ? ? ? ? ? ? ? ? ? ?  ?  ? ? ? ? ? ? ? ? ?Anesthesia Physical ? ?Anesthesia Plan ? ?ASA: 3 ? ?Anesthesia Plan: General  ? ?Post-op Pain Management: Minimal or no pain anticipated  ? ?Induction: Intravenous ? ?PONV Risk Score and Plan: 3 and Ondansetron, Dexamethasone, Midazolam and Treatment may vary due to age or medical condition ? ?Airway Management Planned: Oral ETT ? ?Additional Equipment:  ? ?Intra-op Plan:  ? ?Post-operative Plan: Extubation in OR ? ?Informed Consent: I have reviewed the patients History and Physical, chart, labs and discussed the procedure including the risks, benefits and alternatives for the proposed anesthesia with the patient or authorized representative who has indicated his/her understanding and acceptance.  ? ? ? ?Dental advisory given ? ?Plan Discussed with: CRNA ? ?Anesthesia Plan Comments:   ? ? ? ? ? ? ?Anesthesia Quick Evaluation ? ?

## 2021-12-01 NOTE — Transfer of Care (Signed)
Immediate Anesthesia Transfer of Care Note ? ?Patient: Marilyn [name] ? ?Procedure(s) Performed: LEFT SHOULDER INCISION & DRAINAGE (Left: Shoulder) ? ?Patient Location: PACU ? ?Anesthesia Type:General ? ?Level of Consciousness: awake and alert  ? ?Airway & Oxygen Therapy: Patient Spontanous Breathing and Patient connected to nasal cannula oxygen ? ?Post-op Assessment: Report given to RN and Post -op Vital signs reviewed and stable ? ?Post vital signs: Reviewed and stable ? ?Last Vitals:  ?Vitals Value Taken Time  ?BP 142/104 12/01/21 1827  ?Temp    ?Pulse 68   ?Resp 16 12/01/21 1831  ?SpO2 100%   ?Vitals shown include unvalidated device data. ? ?Last Pain:  ?Vitals:  ? 12/01/21 1245  ?TempSrc:   ?PainSc: 8   ?   ? ?  ? ?Complications: No notable events documented. ?

## 2021-12-02 LAB — BASIC METABOLIC PANEL
Anion gap: 13 (ref 5–15)
BUN: 11 mg/dL (ref 6–20)
CO2: 25 mmol/L (ref 22–32)
Calcium: 9 mg/dL (ref 8.9–10.3)
Chloride: 98 mmol/L (ref 98–111)
Creatinine, Ser: 1.41 mg/dL — ABNORMAL HIGH (ref 0.44–1.00)
GFR, Estimated: 45 mL/min — ABNORMAL LOW (ref >60)
Glucose, Bld: 186 mg/dL — ABNORMAL HIGH (ref 70–99)
Potassium: 3.8 mmol/L (ref 3.5–5.1)
Sodium: 136 mmol/L (ref 135–145)

## 2021-12-02 LAB — CBC
HCT: 28.1 % — ABNORMAL LOW (ref 36.0–46.0)
Hemoglobin: 8.7 g/dL — ABNORMAL LOW (ref 12.0–15.0)
MCH: 27.9 pg (ref 26.0–34.0)
MCHC: 31 g/dL (ref 30.0–36.0)
MCV: 90.1 fL (ref 80.0–100.0)
Platelets: 326 10*3/uL (ref 150–400)
RBC: 3.12 MIL/uL — ABNORMAL LOW (ref 3.87–5.11)
RDW: 14.6 % (ref 11.5–15.5)
WBC: 7.3 10*3/uL (ref 4.0–10.5)
nRBC: 0 % (ref 0.0–0.2)

## 2021-12-02 NOTE — Progress Notes (Signed)
Patient arrived to unit from PACU. Report given by RN. Patient alert and oriented x4. Patient oriented to unit. No questions at this time. Bed in the lowest position. Call button, side table, and patient's belongings within reach of patient. ?

## 2021-12-02 NOTE — Progress Notes (Signed)
PT Cancellation Note ? ?Patient Details ?Name: Marilyn [name] ?MRN: [id number] ?DOB: 04/09/1971 ? ? ?Cancelled Treatment:    Reason Eval/Treat Not Completed: PT screened, no needs identified, will sign off. Per OT and pt she is mobilizing well with only limitation being medical devices (VAC/monitor). Encouraged pt to have staff place [facility] on IV pole to allow her more freedom to move. Referred to mobility team.  ? ? ?Angelina Ok [facility] ?12/02/2021, 11:34 AM ?Skip Mayer PT ?Acute Rehabilitation Services ?Pager [phone] ?Office [phone] ? ?

## 2021-12-02 NOTE — Progress Notes (Signed)
?  Subjective: ?Patient stable.  Pain controlled.  Wound VAC is functional and working  ? ?Objective: ?Vital signs in last 24 hours: ?Temp:  [97.3 ?F (36.3 ?C)-98.4 ?F (36.9 ?C)] 98.4 ?F (36.9 ?C) (03/18 0801) ?Pulse Rate:  [69-83] 83 (03/18 0801) ?Resp:  [12-18] 16 (03/18 0801) ?BP: (100-159)/(55-104) 159/79 (03/18 0801) ?SpO2:  [96 %-100 %] 99 % (03/18 0801) ?Weight:  [124.7 kg] 124.7 kg (03/17 1231) ? ?Intake/Output from previous day: ?03/17 0701 - 03/18 0700 ?In: 669.1 [I.V.:349.1; IV Piggyback:200] ?Out: 350 [Drains:350] ?Intake/Output this shift: ?No intake/output data recorded. ? ?Exam: ? ?Compartment soft ?Incision intact and deltoid functional ? ?Labs: ?Recent Labs  ?  12/02/21 ?0240  ?HGB 8.7*  ? ?Recent Labs  ?  12/02/21 ?0240  ?WBC 7.3  ?RBC 3.12*  ?HCT 28.1*  ?PLT 326  ? ?Recent Labs  ?  12/02/21 ?0240  ?NA 136  ?K 3.8  ?CL 98  ?CO2 25  ?BUN 11  ?CREATININE 1.41*  ?GLUCOSE 186*  ?CALCIUM 9.0  ? ?No results for input(s): LABPT, INR in the last 72 hours. ? ?Assessment/Plan: ?Plan at this time is continue wound VAC. ?Okay for her to use the Prolene brace from home 1 hour 3 times a day for shoulder passive range of motion ?OT daily for shoulder active assisted and passive range of motion ?Anticipate surgery by Dr. Lajoyce Corners next week for wound management and closure ?ID consult for antibiotic recommendation ? ? ?Burnard Bunting ?12/02/2021, 8:03 AM  ? ? ?  ?

## 2021-12-02 NOTE — Plan of Care (Signed)

## 2021-12-02 NOTE — Evaluation (Signed)
Occupational Therapy Evaluation ?Patient Details ?Name: Marilyn [name] ?MRN: [id number] ?DOB: 1970/11/01 ?Today's Date: 12/02/2021 ? ? ?History of Present Illness Patient is a 51 year old female admitted for L shoulder debridement and wound vac after reverse total shoulder arthroplasty on 3/2. Plans for further debridement on 3/22. PMH: HTN, depression, anxiety, morbid obesity, back surgery, B TKA, L THA, L rotator cuff repair  ? ?Clinical Impression ?  ?Patient at baseline reports she is independent with self care. Since shoulder surgery reports needing some help with dressing and bathing upper body. Patient educated in shoulder precautions and compensatory strategies and reports familiar with these as well as hand outs as she was given them at initial shoulder surgery. Patient did not need physical assistance for transfer to recliner chair, primarily there to assist with managing wound vac. Will continue to follow acutely as patient has planned further debridement scheduled for 3/22.   ?   ? ?Recommendations for follow up therapy are one component of a multi-disciplinary discharge planning process, led by the attending physician.  Recommendations may be updated based on patient status, additional functional criteria and insurance authorization.  ? ?Follow Up Recommendations ? Follow physician's recommendations for discharge plan and follow up therapies  ?  ?Assistance Recommended at Discharge PRN  ?Patient can return home with the following A little help with bathing/dressing/bathroom;Assistance with cooking/housework ? ?  ?Functional Status Assessment ? Patient has had a recent decline in their functional status and demonstrates the ability to make significant improvements in function in a reasonable and predictable amount of time.  ?Equipment Recommendations ? None recommended by OT  ?  ?   ?Precautions / Restrictions Precautions ?Precautions: Shoulder ?Type of Shoulder Precautions: ok for L pendulums, AROM  elbow to hand, ER 0-30 ?Shoulder Interventions: Shoulder sling/immobilizer;Off for dressing/bathing/exercises ?Precaution Booklet Issued: Yes (comment) ?Required Braces or Orthoses: Sling ?Restrictions ?Weight Bearing Restrictions: Yes ?LUE Weight Bearing: Non weight bearing  ? ?  ? ?Mobility Bed Mobility ?Overal bed mobility: Modified Independent ?  ?  ?  ?  ?  ?  ?  ?  ? ?Transfers ?Overall transfer level: Modified independent ?  ?  ?  ?  ?  ?  ?  ?  ?  ?  ? ?  ?Balance Overall balance assessment: Modified Independent ?  ?  ?  ?  ?  ?  ?  ?  ?  ?  ?  ?  ?  ?  ?  ?  ?  ?  ?   ? ?ADL either performed or assessed with clinical judgement  ? ?ADL Overall ADL's : Needs assistance/impaired ?Eating/Feeding: Independent;Sitting ?  ?Grooming: Independent;Standing ?  ?Upper Body Bathing: Minimal assistance;Sitting ?Upper Body Bathing Details (indicate cue type and reason): educated in compensatory strategies ?Lower Body Bathing: Minimal assistance;Sitting/lateral leans ?  ?Upper Body Dressing : Minimal assistance;Sitting ?Upper Body Dressing Details (indicate cue type and reason): instructed in compensatory strategies and donning and doffing sling, instructed in sling wearing schedule ?Lower Body Dressing: Set up;Sit to/from stand ?  ?Toilet Transfer: Modified Independent ?Toilet Transfer Details (indicate cue type and reason): Patient is able to transfer to recliner chair without physical assistance. Does need assist managing wound vac ?Toileting- Clothing Manipulation and Hygiene: Modified independent;Sit to/from stand ?  ?  ?  ?Functional mobility during ADLs: Modified independent ?   ? ? ? ?Vision Baseline Vision/History: 1 Wears glasses ?Ability to See in Adequate Light: 0 Adequate ?Patient Visual Report: No change from  baseline ?   ?   ?   ?   ? ?Pertinent Vitals/Pain Pain Assessment ?Pain Assessment: Faces ?Faces Pain Scale: Hurts little more ?Pain Location: L shoulder ?Pain Descriptors / Indicators: Sore ?Pain  Intervention(s): Premedicated before session  ? ? ? ?Hand Dominance Right ?  ?Extremity/Trunk Assessment Upper Extremity Assessment ?Upper Extremity Assessment: LUE deficits/detail ?LUE Deficits / Details: Educated in shoulder pendulums, AROM elbow to hand ?  ?  ?  ?Cervical / Trunk Assessment ?Cervical / Trunk Assessment: Other exceptions ?Cervical / Trunk Exceptions: increased body habitus ?  ?Communication Communication ?Communication: No difficulties ?  ?Cognition Arousal/Alertness: Awake/alert ?Behavior During Therapy: [facility] for tasks assessed/performed ?Overall Cognitive Status: Within Functional Limits for tasks assessed ?  ?  ?  ?  ?  ?  ?  ?  ?  ?  ?  ?  ?  ?  ?  ?  ?  ?  ?  ?   ?   ?   ? ? ?Home Living Family/patient expects to be discharged to:: Private residence ?Living Arrangements: Other relatives ?Available Help at Discharge: Family;Available 24 hours/day ?Type of Home: House ?Home Access: Ramped entrance ?  ?  ?Home Layout: One level ?  ?  ?Bathroom Shower/Tub: Tub/shower unit ?  ?  ?  ?  ?Home Equipment: Gilmer Mor - single point;Shower seat;BSC/3in1 ?  ?  ?  ? ?  ?Prior Functioning/Environment Prior Level of Function : Independent/Modified Independent ?  ?  ?  ?  ?  ?  ?Mobility Comments: walks with a cane ?ADLs Comments: sits to shower, mostly wears dresses and slip on shoes ?  ? ?  ?  ?OT Problem List: Pain;Obesity;Impaired UE functional use;Decreased knowledge of use of DME or AE;Decreased safety awareness ?  ?   ?OT Treatment/Interventions: Self-care/ADL training;Patient/family education;Therapeutic activities;Therapeutic exercise;DME and/or AE instruction  ?  ?OT Goals(Current goals can be found in the care plan section) Acute Rehab OT Goals ?Patient Stated Goal: Take care of myself ?OT Goal Formulation: With patient ?Time For Goal Achievement: 12/16/21 ?Potential to Achieve Goals: Good  ?OT Frequency: Min 2X/week ?  ? ?   ?AM-PAC OT "6 Clicks" Daily Activity     ?Outcome Measure Help from another  person eating meals?: None ?Help from another person taking care of personal grooming?: None ?Help from another person toileting, which includes using toliet, bedpan, or urinal?: None ?Help from another person bathing (including washing, rinsing, drying)?: A Little ?Help from another person to put on and taking off regular upper body clothing?: A Little ?Help from another person to put on and taking off regular lower body clothing?: A Little ?6 Click Score: 21 ?  ?End of Session Equipment Utilized During Treatment: Other (comment) (sling) ?Nurse Communication: Mobility status ? ?Activity Tolerance: Patient tolerated treatment well ?Patient left: in chair;with call bell/phone within reach ? ?OT Visit Diagnosis: Pain ?Pain - Right/Left: Left ?Pain - part of body: Shoulder  ?              ?Time: 7510-2585 ?OT Time Calculation (min): 24 min ?Charges:  OT General Charges ?$OT Visit: 1 Visit ?OT Evaluation ?$OT Eval Low Complexity: 1 Low ?OT Treatments ?$Self Care/Home Management : 8-22 mins ? ?[name] OT ?OT pager: [phone] ? ? ?Carmelia Roller ?12/02/2021, 1:29 PM ?

## 2021-12-02 NOTE — Progress Notes (Signed)
Patient ID: Marilyn [name], female   DOB: 23-Jan-1971, 51 y.o.   MRN: [id number] ?Patient is postoperative day 1 debridement of superficial infection left shoulder incision.  She has a wound VAC with installation functioning well.  I we will plan to take her to surgery on Wednesday for further debridement application of Kerecis biologic graft and wound closure. ?

## 2021-12-03 LAB — CBC
HCT: 25.9 % — ABNORMAL LOW (ref 36.0–46.0)
Hemoglobin: 8.2 g/dL — ABNORMAL LOW (ref 12.0–15.0)
MCH: 28.7 pg (ref 26.0–34.0)
MCHC: 31.7 g/dL (ref 30.0–36.0)
MCV: 90.6 fL (ref 80.0–100.0)
Platelets: 315 10*3/uL (ref 150–400)
RBC: 2.86 MIL/uL — ABNORMAL LOW (ref 3.87–5.11)
RDW: 14.5 % (ref 11.5–15.5)
WBC: 9.1 10*3/uL (ref 4.0–10.5)
nRBC: 0 % (ref 0.0–0.2)

## 2021-12-03 NOTE — Anesthesia Postprocedure Evaluation (Signed)
Anesthesia Post Note ? ?Patient: Marilyn [name] ? ?Procedure(s) Performed: LEFT SHOULDER IRRIGATION AND EXCISIONAL DEBRIDEMENT (Left: Shoulder) ?APPLICATION OF WOUND VAC (Left: Shoulder) ? ?  ? ?Patient location during evaluation: PACU ?Anesthesia Type: General ?Level of consciousness: awake and alert ?Pain management: pain level controlled ?Vital Signs Assessment: post-procedure vital signs reviewed and stable ?Respiratory status: spontaneous breathing, nonlabored ventilation, respiratory function stable and patient connected to nasal cannula oxygen ?Cardiovascular status: blood pressure returned to baseline and stable ?Postop Assessment: no apparent nausea or vomiting ?Anesthetic complications: no ? ? ?No notable events documented. ? ?Last Vitals:  ?Vitals:  ? 12/02/21 1934 12/03/21 0715  ?BP: (!) 158/70 (!) 117/58  ?Pulse: 77 75  ?Resp: 15 16  ?Temp: 37 ?C 36.5 ?C  ?SpO2: 93%   ?  ?Last Pain:  ?Vitals:  ? 12/03/21 0715  ?TempSrc: Oral  ?PainSc:   ? ? ?  ?  ?  ?  ?  ?  ? ?Karrina Lye S ? ? ? ? ?

## 2021-12-03 NOTE — Progress Notes (Signed)
Mobility Specialist Progress Note  ? ? 12/03/21 1448  ?Mobility  ?Activity Ambulated independently in hallway  ?Level of Assistance Modified independent, requires aide device or extra time  ?Assistive Device [place]  ?Distance Ambulated (ft) 470 ft  ?Activity Response Tolerated well  ?$Mobility charge 1 Mobility  ? ?Pt received in chair and agreeable. No complaints. Slightly SOB with exertion. Took x1 seated rest break. Returned to chair with call bell in reach and visitor present.  ? ?[place] ?Mobility Specialist  ?  ?

## 2021-12-03 NOTE — Progress Notes (Signed)
? ?  Subjective: ? ?No acute events overnight.  Pt resting comfortably this AM in bedside chair.  Pain well controlled.  ? ?Objective:  ? ?VITALS:   ?Vitals:  ? 12/02/21 0801 12/02/21 1603 12/02/21 1934 12/03/21 0715  ?BP: (!) 159/79 131/68 (!) 158/70 (!) 117/58  ?Pulse: 83 71 77 75  ?Resp: 16 17 15 16   ?Temp: 98.4 ?F (36.9 ?C) 98.1 ?F (36.7 ?C) 98.6 ?F (37 ?C) 97.7 ?F (36.5 ?C)  ?TempSrc: Oral Oral Oral Oral  ?SpO2: 99% 98% 93%   ?Weight:      ?Height:      ? ? ?Gen: NAD, resting comfortably ?Pulm: Normal WOB on RA ?CV: BUE warm and well perfused ?LUE: Dressing clean and dry, WVAC intact w/ good seal, cannister just changed with no output currently, AIN/PIN/U motor function intact, hand warm and well perfused.  ? ? ? ?Lab Results  ?Component Value Date  ? WBC 9.1 12/03/2021  ? HGB 8.2 (L) 12/03/2021  ? HCT 25.9 (L) 12/03/2021  ? MCV 90.6 12/03/2021  ? PLT 315 12/03/2021  ? ? ? ?Assessment/Plan: ? ?51 yo F 2 Days Post-Op s/p I&D left superficial shoulder wound infection following RTSA.  Doing well postoperatively. ? ?Plan for repeat I&D w/ likely wound closure on Wednesday ?Continue WVAC, reinforce PRN ?Intraop cx from 3/17 w/ NGTD ? ? ? ?4/17, MD ?12/03/2021, 11:02 AM ?[phone] ? ?

## 2021-12-03 NOTE — Plan of Care (Signed)

## 2021-12-04 ENCOUNTER — Encounter (HOSPITAL_COMMUNITY): Payer: Self-pay | Admitting: Orthopedic Surgery

## 2021-12-04 DIAGNOSIS — Z96612 Presence of left artificial shoulder joint: Secondary | ICD-10-CM | POA: Diagnosis not present

## 2021-12-04 DIAGNOSIS — T8149XA Infection following a procedure, other surgical site, initial encounter: Secondary | ICD-10-CM | POA: Diagnosis not present

## 2021-12-04 LAB — SEDIMENTATION RATE: Sed Rate: 70 mm/hr — ABNORMAL HIGH (ref 0–22)

## 2021-12-04 LAB — C-REACTIVE PROTEIN: CRP: 2.5 mg/dL — ABNORMAL HIGH (ref <1.0)

## 2021-12-04 MED ORDER — DOXYCYCLINE HYCLATE 100 MG PO TABS
100.0000 mg | ORAL_TABLET | Freq: Two times a day (BID) | ORAL | Status: DC
  Administered 2021-12-04 – 2021-12-08 (×8): 100 mg via ORAL
  Filled 2021-12-04 (×8): qty 1

## 2021-12-04 MED ORDER — CEFDINIR 300 MG PO CAPS
300.0000 mg | ORAL_CAPSULE | Freq: Two times a day (BID) | ORAL | Status: DC
  Administered 2021-12-04 – 2021-12-08 (×8): 300 mg via ORAL
  Filled 2021-12-04 (×12): qty 1

## 2021-12-04 NOTE — Progress Notes (Signed)
Mobility Specialist: Progress Note ? ? 12/04/21 1224  ?Mobility  ?Activity Ambulated with assistance in hallway  ?Level of Assistance Modified independent, requires aide device or extra time  ?Assistive Device [place]  ?LUE Weight Bearing NWB  ?Distance Ambulated (ft) 410 ft ?(205'x2)  ?Activity Response Tolerated well  ?$Mobility charge 1 Mobility  ? ?Pt received in chair and agreeable to mobility. C/o L shoulder pain during mobility, no rating given. Stopped x1 for seated break at the end of the hallway d/t general fatigue. Pt to chair after session with all needs met.  ? ?Marilyn [name] ?Mobility Specialist ?Mobility Specialist [place]: [phone] ?Mobility Specialist [place]: [phone] ? ?

## 2021-12-04 NOTE — Care Management Important Message (Signed)
Important Message ? ?Patient Details  ?Name: Marilyn [name] ?MRN: [id number] ?Date of Birth: [birth year] ? ? ?Medicare Important Message Given:  Yes ? ? ? ? ?[name]  Oaklie Durrett-Martin ?12/04/2021, 2:00 PM ?

## 2021-12-04 NOTE — Consult Note (Signed)
?   ? ? ? ? ?[facility]   ? ?Date of Admission:  12/01/2021    ? ?Reason for Consult: surgical site infection    ?Referring Provider: August Saucer ? ? ? ?Abx: ?3/20-c doxycycline ?3/20-c cefdinir ?      ? ? ?Assessment: ?51 yo female with obesity, hx of bilateral knee arthroplasty 2012-2013, left hip arthroplasty 2016, hx of L4-5 and L5-s1 lumbar fusion 05/27/2018 that was complicated by pan-sensitive pseudomonas surgical site infection s/p I&D 06/11/18 and long course of cefepime -->levaquin finished by 10/31/2018, recent left shoulder reverse arthroplasty on 11/16/2021 complicated by serous discharge/fat necrosis concerning for superficial wound infection s/p I&D 3/17 ? ?It appears she has another swab cx in orthopedics clinic on 3/15. I have requested ortho team to send a result of this culture as well to [place] ? ?3/17 operative cx ngtd ? ?Discussed with orthopedics team. We can treat this as superficial infection for 10 days and follow closely for ongoing sign of infection that would suggest a deeper process and restart abx as needed ? ?Plan: ?Doxy/cefdinir x10 days starting 3/20 ?Follow up id clinic on 3/29 @ 345pm with dr Renold Don at [facility] ?Have discussed with ortho team if they could send me the 3/15 office swab culture as well  ?Will follow up on final culture here ? ? ? ? ?------------------------------------------------ ?Principal Problem: ?  S/P reverse total shoulder arthroplasty, left ? ? ? ?HPI: Marilyn [name] is a 51 y.o. female admitted for concern of surgical site infection after left shoulder reverse arthroplasty ? ? ?She has shoulder surgery 3/02. Postop sent with wound vac. She noted ongoing clear discharge without redness/fever/chill. She saw ortho clinic 3/15 who didn't feel fluctuance on exam with noted non-purulent discharge. A deep swab cx was sent. She was then admitted for planned I&D ? ?She didn't take any abx prior to 3/17 admission ?I reviewed operative note on 3/17. No  incision deep to the muscle layer. There was necrotic fat tissue and "fluid" without purulence mentioned.  ? ?She is doing well post op ? ?She'll go back to the er in 2 days for closure. She has a wound vac now that is draining significant serosanguinous fluid ? ?Again no f/c ? ?Pain earlier but improved with down adjustment of the wound vac pressure ? ?Culture ngtd ? ?I reviewed her prior ortho history: ?Bilateral knee arthroplasty ?Left hip arthroplasty  ?Lumbar fusion 2019 with superficial pseudomonas infection s/p 3 month abx ? ? ?No n/v/diarrhea ? ? ? ?Family History  ?Problem Relation Age of Onset  ? Cancer Mother   ?     colon  ? Epilepsy Mother   ? Cancer Father   ?     prostate  ? Diabetes Father   ? Hypertension Father   ? Hypertension Maternal Aunt   ? Diabetes Maternal Aunt   ? Hypertension Paternal Aunt   ? ? ?Social History  ? ?Tobacco Use  ? Smoking status: Former  ?  Packs/day: 0.50  ?  Years: 4.00  ?  Pack years: 2.00  ?  Types: Cigarettes  ?  Quit date: 09/18/1987  ?  Years since quitting: 34.2  ? Smokeless tobacco: Never  ?Vaping Use  ? Vaping Use: Never used  ?Substance Use Topics  ? Alcohol use: No  ? Drug use: No  ? ? ?Allergies  ?Allergen Reactions  ? Lisinopril Anaphylaxis  ?  Angioedema  ? Bee Venom Swelling and Other (See Comments)  ?  Swelling at the site  ? Bupropion Swelling  ? Latex Itching and Rash  ? Propoxyphene Hives  ? Acetaminophen Other (See Comments)  ?  unknown  ? Methadone Other (See Comments)  ?  unknown  ? Codeine Nausea Only  ? Meloxicam Other (See Comments)  ?  Insomnia, constipation  ? Morphine And Related Rash  ? Tegaderm Ag Mesh [Silver] Hives, Itching and Rash  ? Tomato Rash  ? ? ?Review of Systems: ?ROS ?All Other ROS was negative, except mentioned above ? ? ?Past Medical History:  ?Diagnosis Date  ? Anemia   ? taking iron now. pt states having no current issues 09/02/2015  ? Anginal pain (HCC)   ? pt states experiences chest wall pain pt states related to her asthma    ? Anxiety   ? with MRI's  ? Arthritis   ? Everywhere  ? Asthma   ? Depression   ? Dizziness   ? GERD (gastroesophageal reflux disease)   ? Headache(784.0)   ? HX OF MIGRAINES  ? History of bronchitis   ? Hypertension   ? Hypothyroidism   ? takes levothyroxen  ? OSA on CPAP   ? wears cpap  ? Shortness of breath   ? with exertion  ? Wears glasses   ? ? ? ? ? ?Scheduled Meds: ? allopurinol  100 mg Oral BID  ? aspirin EC  81 mg Oral Daily  ? buPROPion  300 mg Oral Daily  ? docusate sodium  100 mg Oral BID  ? DULoxetine  60 mg Oral Daily  ? gabapentin  400 mg Oral BID  ? hydrochlorothiazide  25 mg Oral Daily  ? levothyroxine  150 mcg Oral QAC breakfast  ? oxybutynin  15 mg Oral Daily  ? ?Continuous Infusions: ? methocarbamol (ROBAXIN) IV    ? ?PRN Meds:.acetaminophen, albuterol, HYDROmorphone (DILAUDID) injection, menthol-cetylpyridinium **OR** phenol, methocarbamol **OR** methocarbamol (ROBAXIN) IV, metoCLOPramide **OR** metoCLOPramide (REGLAN) injection, ondansetron **OR** ondansetron (ZOFRAN) IV, oxyCODONE ? ? ?OBJECTIVE: ?Blood pressure (!) 117/55, pulse 70, temperature 98.2 ?F (36.8 ?C), temperature source Oral, resp. rate 17, height 5\' 3"  (1.6 m), weight 124.7 kg, SpO2 99 %. ? ?Physical Exam ? ?General/constitutional: no distress, pleasant ?HEENT: Normocephalic, PER, Conj Clear, EOMI, Oropharynx clear ?Neck supple ?CV: rrr no mrg ?Lungs: clear to auscultation, normal respiratory effort ?Abd: Soft, Nontender ?Ext: no edema ?Skin: No Rash ?Neuro: nonfocal ?MSK: left shoulder dressing clean/dry ? ? ? ?Lab Results ?Lab Results  ?Component Value Date  ? WBC 9.1 12/03/2021  ? HGB 8.2 (L) 12/03/2021  ? HCT 25.9 (L) 12/03/2021  ? MCV 90.6 12/03/2021  ? PLT 315 12/03/2021  ?  ?Lab Results  ?Component Value Date  ? CREATININE 1.41 (H) 12/02/2021  ? BUN 11 12/02/2021  ? NA 136 12/02/2021  ? K 3.8 12/02/2021  ? CL 98 12/02/2021  ? CO2 25 12/02/2021  ?  ?Lab Results  ?Component Value Date  ? ALT 37 06/17/2019  ? AST 32  06/17/2019  ? ALKPHOS 102 06/17/2019  ? BILITOT 0.6 06/17/2019  ?  ? ? ?Microbiology: ?Recent Results (from the past 240 hour(s))  ?Surgical PCR Screen     Status: None  ? Collection Time: 12/01/21 12:28 PM  ? Specimen: Nasal Mucosa; Nasal Swab  ?Result Value Ref Range Status  ? MRSA, PCR NEGATIVE NEGATIVE Final  ? Staphylococcus aureus NEGATIVE NEGATIVE Final  ?  Comment: (NOTE) ?The Xpert SA Assay (FDA approved for NASAL specimens in patients 22 ?years of  age and older), is one component of a comprehensive ?surveillance program. It is not intended to diagnose infection nor to ?guide or monitor treatment. ?Performed at [facility], [address], [place], [place] ?[address] ?  ?Aerobic/Anaerobic Culture w Gram Stain (surgical/deep wound)     Status: None (Preliminary result)  ? Collection Time: 12/01/21  5:32 PM  ? Specimen: Wound  ?Result Value Ref Range Status  ? Specimen Description WOUND  Final  ? Special Requests LEFT ARM SUPERFICIAL WOUND  Final  ? Gram Stain   Final  ?  RARE WBC PRESENT,BOTH PMN AND MONONUCLEAR ?NO ORGANISMS SEEN ?  ? Culture   Final  ?  NO GROWTH 3 DAYS NO ANAEROBES ISOLATED; CULTURE IN PROGRESS FOR 5 DAYS ?Performed at [facility], [address], [place], [place] [address] ?  ? Report Status PENDING  Incomplete  ?Aerobic/Anaerobic Culture w Gram Stain (surgical/deep wound)     Status: None (Preliminary result)  ? Collection Time: 12/01/21  6:14 PM  ? Specimen: Wound  ?Result Value Ref Range Status  ? Specimen Description WOUND  Final  ? Special Requests LEFT ARM SUPERFICIAL WOUND  Final  ? Gram Stain   Final  ?  FEW WBC PRESENT,BOTH PMN AND MONONUCLEAR ?NO ORGANISMS SEEN ?  ? Culture   Final  ?  NO GROWTH 3 DAYS NO ANAEROBES ISOLATED; CULTURE IN PROGRESS FOR 5 DAYS ?Performed at [facility], [address], [place], [place] [address] ?  ? Report Status PENDING  Incomplete  ? ? ? ?Serology: ? ? ? ?Imaging: ?If present, new imagings (plain films, ct scans, and mri)  have been personally visualized and interpreted; radiology reports have been reviewed. Decision making incorporated into the Impression / Recommendations. ? ? ? ?Raymondo Band, MD ?[facility] ?

## 2021-12-04 NOTE — Progress Notes (Signed)
?  Subjective: ?Marilyn [name] is a 51 y.o. female s/p left shoulder irrigation and debridement with wound VAC placement for superficial wound with no evidence of actual joint infection.  They are POD 3.  Pt's pain is controlled overall.  Has intense pain when the wound VAC irrigates her wound but otherwise is doing well.  No fevers or chills.  No chest pain or shortness of breath.  No other complaints aside from shoulder pain.  She has been able to ambulate around the unit with the mobility tech and had no lightheadedness or dizziness with this..   ? ?Objective: ?Vital signs in last 24 hours: ?Temp:  [97.8 ?F (36.6 ?C)-98.1 ?F (36.7 ?C)] 98.1 ?F (36.7 ?C) (03/20 0745) ?Pulse Rate:  [65-71] 71 (03/20 0745) ?Resp:  [16-18] 16 (03/20 0745) ?BP: (120-132)/(54-69) 120/54 (03/20 0745) ?SpO2:  [87 %-100 %] 99 % (03/20 0745) ? ?Intake/Output from previous day: ?03/19 0701 - 03/20 0700 ?In: 44  ?Out: -  ?Intake/Output this shift: ?Total I/O ?In: 188 [P.O.:120; Other:68] ?Out: -  ? ?Exam: ? ?No gross blood or drainage overlying the dressing ?2+ radial pulse ?Sensation intact distally in the left hand ?Able to extend the left wrist. ?Wound VAC canister with sanguinous drainage ? ? ?Labs: ?Recent Labs  ?  12/02/21 ?3762 12/03/21 ?0144  ?HGB 8.7* 8.2*  ? ?Recent Labs  ?  12/02/21 ?8315 12/03/21 ?0144  ?WBC 7.3 9.1  ?RBC 3.12* 2.86*  ?HCT 28.1* 25.9*  ?PLT 326 315  ? ?Recent Labs  ?  12/02/21 ?0240  ?NA 136  ?K 3.8  ?CL 98  ?CO2 25  ?BUN 11  ?CREATININE 1.41*  ?GLUCOSE 186*  ?CALCIUM 9.0  ? ?No results for input(s): LABPT, INR in the last 72 hours. ? ?Assessment/Plan: ?Pt is POD 3 s/p left shoulder irrigation and debridement with wound VAC placement   ? -Plan to discharge to home in the coming days after her next procedure with Dr. Lajoyce Corners on Wednesday ? -No lifting with the operative arm ? -Cultures are pending with no growth to date ? -Consult placed with infectious disease.  Spoke with Dr. Elinor Parkinson.  Appreciate her  involvement ?  ? ? ?Marilyn [name] ?12/04/2021, 1:39 PM  ? ? ?   ?

## 2021-12-05 DIAGNOSIS — L0889 Other specified local infections of the skin and subcutaneous tissue: Secondary | ICD-10-CM

## 2021-12-05 NOTE — Progress Notes (Signed)
Patient ID: Marilyn [name], female   DOB: 07-Aug-1971, 51 y.o.   MRN: [id number] ?Patient is status post debridement of superficial infection left shoulder.  We will plan for wound debridement wound closure possible application biologic tissue graft.  Patient has no questions this morning is ready to proceed with surgery tomorrow. ?

## 2021-12-05 NOTE — Plan of Care (Signed)
  Problem: Education: Goal: Knowledge of General Education information will improve Description: Including pain rating scale, medication(s)/side effects and non-pharmacologic comfort measures Outcome: Not Progressing   Problem: Health Behavior/Discharge Planning: Goal: Ability to manage health-related needs will improve Outcome: Not Progressing   Problem: Clinical Measurements: Goal: Ability to maintain clinical measurements within normal limits will improve Outcome: Not Progressing Goal: Will remain free from infection Outcome: Not Progressing Goal: Diagnostic test results will improve Outcome: Not Progressing Goal: Respiratory complications will improve Outcome: Not Progressing Goal: Cardiovascular complication will be avoided Outcome: Not Progressing   Problem: Coping: Goal: Level of anxiety will decrease Outcome: Not Progressing   Problem: Elimination: Goal: Will not experience complications related to bowel motility Outcome: Not Progressing Goal: Will not experience complications related to urinary retention Outcome: Not Progressing   Problem: Pain Managment: Goal: General experience of comfort will improve Outcome: Not Progressing   Problem: Safety: Goal: Ability to remain free from injury will improve Outcome: Not Progressing   Problem: Skin Integrity: Goal: Risk for impaired skin integrity will decrease Outcome: Not Progressing   

## 2021-12-05 NOTE — Progress Notes (Signed)
Occupational Therapy Treatment ?Patient Details ?Name: Marilyn [name] ?MRN: [id number] ?DOB: Sep 25, 1970 ?Today's Date: 12/05/2021 ? ? ?History of present illness Patient is a 51 year old female admitted for L shoulder debridement and wound vac after reverse total shoulder arthroplasty on 3/2. Plans for further debridement on 3/22. PMH: HTN, depression, anxiety, morbid obesity, back surgery, B TKA, L THA, L rotator cuff repair ?  ?OT comments ? Pt is able to complete Pendulums and PROM exercises for the left shoulder with cueing in both sitting and standing.  She is also tolerating use of the PROM shoulder abduction machine as well.  Will continue to follow for further education and strengthening while in acute care.   ? ?Recommendations for follow up therapy are one component of a multi-disciplinary discharge planning process, led by the attending physician.  Recommendations may be updated based on patient status, additional functional criteria and insurance authorization. ?   ?Follow Up Recommendations ? Follow physician's recommendations for discharge plan and follow up therapies  ?  ?Assistance Recommended at Discharge PRN  ?Patient can return home with the following ? A little help with bathing/dressing/bathroom;Assistance with cooking/housework ?  ?Equipment Recommendations ? None recommended by OT  ?  ?   ?Precautions / Restrictions Precautions ?Precautions: Shoulder ?Type of Shoulder Precautions: ok for L pendulums, AROM elbow to hand, ER 0-30 ?Shoulder Interventions: Shoulder sling/immobilizer;Off for dressing/bathing/exercises ?Required Braces or Orthoses: Sling ?Restrictions ?Weight Bearing Restrictions: Yes ?LUE Weight Bearing: Non weight bearing  ? ? ?  ? ?Mobility Bed Mobility ?  ?  ?  ?  ?  ?  ?  ?  ?  ? ?Transfers ?Overall transfer level: Modified independent ?Equipment used: None ?  ?  ?  ?  ?  ?  ?  ?General transfer comment: Pt ambulated to the sink/counter for completion of pendulum exercises ?  ?   ?Balance Overall balance assessment: Modified Independent ?  ?  ?  ?  ?  ?  ?  ?  ?  ?  ?  ?  ?  ?  ?  ?  ?  ?  ?   ? ?ADL either performed or assessed with clinical judgement  ? ? ? ? ?Cognition Arousal/Alertness: Awake/alert ?[facility] for tasks assessed/performed ?Overall Cognitive Status: Within Functional Limits for tasks assessed ?  ?  ?  ?  ?  ?  ?  ?  ?  ?  ?  ?  ?  ?  ?  ?  ?  ?  ?  ?   ?   ?   ?   ? ? ?Pertinent Vitals/ Pain       Pain Assessment ?Pain Assessment: 0-10 ?Pain Score: 3  ?Pain Location: L shoulder ?Pain Descriptors / Indicators: Discomfort ?Pain Intervention(s): Monitored during session, Limited activity within patient's tolerance ? ?   ?   ? ?Frequency ? Min 2X/week  ? ? ? ? ?  ?Progress Toward Goals ? ?OT Goals(current goals can now be found in the care plan section) ? Progress towards OT goals: Progressing toward goals ? ?Acute Rehab OT Goals ?Patient Stated Goal: To get back on track with her shoulder ?OT Goal Formulation: With patient ?Time For Goal Achievement: 12/16/21 ?Potential to Achieve Goals: Good  ?Plan Discharge plan remains appropriate   ? ?   ?AM-PAC OT "6 Clicks" Daily Activity     ?Outcome Measure ? ? Help from another person eating meals?: None ?Help from another  person taking care of personal grooming?: None ?Help from another person toileting, which includes using toliet, bedpan, or urinal?: None ?Help from another person bathing (including washing, rinsing, drying)?: A Little ?Help from another person to put on and taking off regular upper body clothing?: A Little ?Help from another person to put on and taking off regular lower body clothing?: A Little ?6 Click Score: 21 ? ?  ?End of Session   ? ?OT Visit Diagnosis: Pain;Muscle weakness (generalized) (M62.81) ?Pain - Right/Left: Left ?Pain - part of body: Shoulder ?  ?Activity Tolerance Patient tolerated treatment well ?  ?Patient Left in chair;with call bell/phone within reach ?  ?Nurse Communication  Mobility status;Other (comment) (need to assist with taking pt off PROM abduction machine as well as providing assist for donning her sling) ?  ? ?   ? ?Time: [id number] ?OT Time Calculation (min): 35 min ? ?Charges: OT General Charges ?$OT Visit: 1 Visit ?OT Treatments ?$Therapeutic Exercise: 23-37 mins ? ? ?Taylon Louison OTR/L ? ?12/05/2021, 11:53 AM ?

## 2021-12-05 NOTE — H&P (View-Only) (Signed)
Patient ID: Marilyn [name], female   DOB: 08/28/1971, 50 y.o.   MRN: [id number] ?Patient is status post debridement of superficial infection left shoulder.  We will plan for wound debridement wound closure possible application biologic tissue graft.  Patient has no questions this morning is ready to proceed with surgery tomorrow. ?

## 2021-12-06 ENCOUNTER — Encounter (HOSPITAL_COMMUNITY)
Admission: RE | Disposition: A | Discharge status: Home / Self Care | Payer: Self-pay | Source: Home / Self Care | Attending: Orthopedic Surgery

## 2021-12-06 ENCOUNTER — Inpatient Hospital Stay (HOSPITAL_COMMUNITY): Payer: Medicare Other | Admitting: Certified Registered Nurse Anesthetist

## 2021-12-06 ENCOUNTER — Encounter (HOSPITAL_COMMUNITY): Payer: Self-pay | Admitting: Orthopedic Surgery

## 2021-12-06 ENCOUNTER — Other Ambulatory Visit: Payer: Self-pay

## 2021-12-06 DIAGNOSIS — G4730 Sleep apnea, unspecified: Secondary | ICD-10-CM

## 2021-12-06 DIAGNOSIS — Z96612 Presence of left artificial shoulder joint: Secondary | ICD-10-CM | POA: Diagnosis not present

## 2021-12-06 DIAGNOSIS — T8149XA Infection following a procedure, other surgical site, initial encounter: Secondary | ICD-10-CM | POA: Diagnosis not present

## 2021-12-06 DIAGNOSIS — S41002A Unspecified open wound of left shoulder, initial encounter: Secondary | ICD-10-CM

## 2021-12-06 DIAGNOSIS — I10 Essential (primary) hypertension: Secondary | ICD-10-CM

## 2021-12-06 HISTORY — PX: I & D EXTREMITY: SHX5045

## 2021-12-06 LAB — ANAEROBIC AND AEROBIC CULTURE
AER RESULT:: NO GROWTH
MICRO NUMBER:: 13134372
MICRO NUMBER:: 13134373
SPECIMEN QUALITY:: ADEQUATE
SPECIMEN QUALITY:: ADEQUATE

## 2021-12-06 LAB — AEROBIC/ANAEROBIC CULTURE W GRAM STAIN (SURGICAL/DEEP WOUND)
Culture: NO GROWTH
Culture: NO GROWTH

## 2021-12-06 SURGERY — IRRIGATION AND DEBRIDEMENT EXTREMITY
Anesthesia: General | Laterality: Left

## 2021-12-06 MED ORDER — ACETAMINOPHEN 325 MG PO TABS: 325.0000 mg | ORAL_TABLET | Freq: Four times a day (QID) | ORAL | Status: DC | PRN

## 2021-12-06 MED ORDER — FENTANYL CITRATE (PF) 100 MCG/2ML IJ SOLN
INTRAMUSCULAR | Status: AC
  Filled 2021-12-06: qty 2

## 2021-12-06 MED ORDER — ONDANSETRON HCL 4 MG/2ML IJ SOLN
INTRAMUSCULAR | Status: AC
  Filled 2021-12-06: qty 2

## 2021-12-06 MED ORDER — DOCUSATE SODIUM 100 MG PO CAPS: 100.0000 mg | ORAL_CAPSULE | Freq: Two times a day (BID) | ORAL | Status: DC

## 2021-12-06 MED ORDER — METOCLOPRAMIDE HCL 5 MG/ML IJ SOLN: 5.0000 mg | Freq: Three times a day (TID) | INTRAMUSCULAR | Status: DC | PRN

## 2021-12-06 MED ORDER — FENTANYL CITRATE (PF) 250 MCG/5ML IJ SOLN
INTRAMUSCULAR | Status: AC
Start: 2021-12-06 — End: ?
  Filled 2021-12-06: qty 5

## 2021-12-06 MED ORDER — HYDROMORPHONE HCL 1 MG/ML IJ SOLN
0.5000 mg | INTRAMUSCULAR | Status: DC | PRN
  Administered 2021-12-06 – 2021-12-08 (×3): 1 mg via INTRAVENOUS
  Filled 2021-12-06 (×3): qty 1

## 2021-12-06 MED ORDER — OXYCODONE HCL 5 MG PO TABS
10.0000 mg | ORAL_TABLET | ORAL | Status: DC | PRN
  Administered 2021-12-07: 15 mg via ORAL
  Filled 2021-12-06: qty 3

## 2021-12-06 MED ORDER — METHOCARBAMOL 500 MG PO TABS: 500.0000 mg | ORAL_TABLET | Freq: Four times a day (QID) | ORAL | Status: DC | PRN

## 2021-12-06 MED ORDER — DEXAMETHASONE SODIUM PHOSPHATE 10 MG/ML IJ SOLN
INTRAMUSCULAR | Status: DC | PRN
  Administered 2021-12-06: 5 mg via INTRAVENOUS

## 2021-12-06 MED ORDER — EPHEDRINE SULFATE-NACL 50-0.9 MG/10ML-% IV SOSY
PREFILLED_SYRINGE | INTRAVENOUS | Status: DC | PRN
  Administered 2021-12-06: 5 mg via INTRAVENOUS

## 2021-12-06 MED ORDER — MIDAZOLAM HCL 5 MG/5ML IJ SOLN
INTRAMUSCULAR | Status: DC | PRN
  Administered 2021-12-06 (×2): 1 mg via INTRAVENOUS

## 2021-12-06 MED ORDER — 0.9 % SODIUM CHLORIDE (POUR BTL) OPTIME
TOPICAL | Status: DC | PRN
  Administered 2021-12-06: 1000 mL

## 2021-12-06 MED ORDER — ONDANSETRON HCL 4 MG/2ML IJ SOLN: 4.0000 mg | Freq: Once | INTRAMUSCULAR | Status: DC | PRN

## 2021-12-06 MED ORDER — ACETAMINOPHEN 500 MG PO TABS: 1000.0000 mg | ORAL_TABLET | Freq: Once | ORAL | Status: AC

## 2021-12-06 MED ORDER — LIDOCAINE 2% (20 MG/ML) 5 ML SYRINGE
INTRAMUSCULAR | Status: DC | PRN
  Administered 2021-12-06: 100 mg via INTRAVENOUS

## 2021-12-06 MED ORDER — CHLORHEXIDINE GLUCONATE 0.12 % MT SOLN
OROMUCOSAL | Status: AC
  Administered 2021-12-06: 15 mL via OROMUCOSAL
  Filled 2021-12-06: qty 15

## 2021-12-06 MED ORDER — PHENYLEPHRINE HCL-NACL 20-0.9 MG/250ML-% IV SOLN
INTRAVENOUS | Status: DC | PRN
  Administered 2021-12-06: 25 ug/min via INTRAVENOUS

## 2021-12-06 MED ORDER — PHENYLEPHRINE 40 MCG/ML (10ML) SYRINGE FOR IV PUSH (FOR BLOOD PRESSURE SUPPORT)
PREFILLED_SYRINGE | INTRAVENOUS | Status: DC | PRN
  Administered 2021-12-06: 80 ug via INTRAVENOUS

## 2021-12-06 MED ORDER — ACETAMINOPHEN 500 MG PO TABS
ORAL_TABLET | ORAL | Status: AC
  Administered 2021-12-06: 1000 mg via ORAL
  Filled 2021-12-06: qty 2

## 2021-12-06 MED ORDER — OXYCODONE HCL 5 MG PO TABS
5.0000 mg | ORAL_TABLET | ORAL | Status: DC | PRN
  Administered 2021-12-08: 10 mg via ORAL
  Filled 2021-12-06: qty 2

## 2021-12-06 MED ORDER — POLYETHYLENE GLYCOL 3350 17 G PO PACK: 17.0000 g | PACK | Freq: Every day | ORAL | Status: DC | PRN

## 2021-12-06 MED ORDER — SODIUM CHLORIDE 0.9 % IV SOLN: INTRAVENOUS | Status: DC

## 2021-12-06 MED ORDER — ONDANSETRON HCL 4 MG/2ML IJ SOLN
INTRAMUSCULAR | Status: DC | PRN
  Administered 2021-12-06: 4 mg via INTRAVENOUS

## 2021-12-06 MED ORDER — OXYCODONE HCL 5 MG PO TABS
5.0000 mg | ORAL_TABLET | ORAL | Status: DC | PRN
  Administered 2021-12-06: 10 mg via ORAL

## 2021-12-06 MED ORDER — POVIDONE-IODINE 10 % EX SWAB: 2.0000 "application " | Freq: Once | CUTANEOUS | Status: DC

## 2021-12-06 MED ORDER — PHENYLEPHRINE 40 MCG/ML (10ML) SYRINGE FOR IV PUSH (FOR BLOOD PRESSURE SUPPORT)
PREFILLED_SYRINGE | INTRAVENOUS | Status: AC
  Filled 2021-12-06: qty 10

## 2021-12-06 MED ORDER — CEFAZOLIN SODIUM-DEXTROSE 2-4 GM/100ML-% IV SOLN: 2.0000 g | Freq: Four times a day (QID) | INTRAVENOUS | Status: DC

## 2021-12-06 MED ORDER — BISACODYL 10 MG RE SUPP: 10.0000 mg | Freq: Every day | RECTAL | Status: DC | PRN

## 2021-12-06 MED ORDER — ONDANSETRON HCL 4 MG PO TABS: 4.0000 mg | ORAL_TABLET | Freq: Four times a day (QID) | ORAL | Status: DC | PRN

## 2021-12-06 MED ORDER — METOCLOPRAMIDE HCL 5 MG PO TABS: 5.0000 mg | ORAL_TABLET | Freq: Three times a day (TID) | ORAL | Status: DC | PRN

## 2021-12-06 MED ORDER — ONDANSETRON HCL 4 MG/2ML IJ SOLN: 4.0000 mg | Freq: Four times a day (QID) | INTRAMUSCULAR | Status: DC | PRN

## 2021-12-06 MED ORDER — METHOCARBAMOL 1000 MG/10ML IJ SOLN: 500.0000 mg | Freq: Four times a day (QID) | INTRAVENOUS | Status: DC | PRN

## 2021-12-06 MED ORDER — ORAL CARE MOUTH RINSE: 15.0000 mL | Freq: Once | OROMUCOSAL | Status: AC

## 2021-12-06 MED ORDER — CEFAZOLIN IN SODIUM CHLORIDE 3-0.9 GM/100ML-% IV SOLN
3.0000 g | INTRAVENOUS | Status: AC
  Administered 2021-12-06: 3 g via INTRAVENOUS
  Filled 2021-12-06: qty 100

## 2021-12-06 MED ORDER — OXYCODONE HCL 5 MG/5ML PO SOLN: 5.0000 mg | Freq: Once | ORAL | Status: DC | PRN

## 2021-12-06 MED ORDER — FENTANYL CITRATE (PF) 250 MCG/5ML IJ SOLN
INTRAMUSCULAR | Status: DC | PRN
Start: 2021-12-06 — End: 2021-12-06
  Administered 2021-12-06: 25 ug via INTRAVENOUS
  Administered 2021-12-06: 50 ug via INTRAVENOUS
  Administered 2021-12-06: 25 ug via INTRAVENOUS
  Administered 2021-12-06: 50 ug via INTRAVENOUS
  Administered 2021-12-06 (×2): 25 ug via INTRAVENOUS
  Administered 2021-12-06: 50 ug via INTRAVENOUS

## 2021-12-06 MED ORDER — LACTATED RINGERS IV SOLN: INTRAVENOUS | Status: DC

## 2021-12-06 MED ORDER — EPHEDRINE 5 MG/ML INJ
INTRAVENOUS | Status: AC
  Filled 2021-12-06: qty 5

## 2021-12-06 MED ORDER — MIDAZOLAM HCL 2 MG/2ML IJ SOLN
INTRAMUSCULAR | Status: AC
  Filled 2021-12-06: qty 2

## 2021-12-06 MED ORDER — CHLORHEXIDINE GLUCONATE 0.12 % MT SOLN: 15.0000 mL | Freq: Once | OROMUCOSAL | Status: AC

## 2021-12-06 MED ORDER — LACTATED RINGERS IV SOLN: INTRAVENOUS | Status: DC | PRN

## 2021-12-06 MED ORDER — SUCCINYLCHOLINE CHLORIDE 200 MG/10ML IV SOSY
PREFILLED_SYRINGE | INTRAVENOUS | Status: AC
  Filled 2021-12-06: qty 10

## 2021-12-06 MED ORDER — FENTANYL CITRATE (PF) 100 MCG/2ML IJ SOLN
25.0000 ug | INTRAMUSCULAR | Status: DC | PRN
  Administered 2021-12-06: 50 ug via INTRAVENOUS
  Administered 2021-12-06 (×2): 25 ug via INTRAVENOUS

## 2021-12-06 MED ORDER — PROPOFOL 10 MG/ML IV BOLUS
INTRAVENOUS | Status: DC | PRN
  Administered 2021-12-06: 200 mg via INTRAVENOUS

## 2021-12-06 MED ORDER — OXYCODONE HCL 5 MG PO TABS: 5.0000 mg | ORAL_TABLET | Freq: Once | ORAL | Status: DC | PRN

## 2021-12-06 MED ORDER — AMISULPRIDE (ANTIEMETIC) 5 MG/2ML IV SOLN: 10.0000 mg | Freq: Once | INTRAVENOUS | Status: DC | PRN

## 2021-12-06 MED ORDER — LIDOCAINE 2% (20 MG/ML) 5 ML SYRINGE
INTRAMUSCULAR | Status: AC
  Filled 2021-12-06: qty 5

## 2021-12-06 MED ORDER — CHLORHEXIDINE GLUCONATE 4 % EX LIQD: 60.0000 mL | Freq: Once | CUTANEOUS | Status: DC

## 2021-12-06 SURGICAL SUPPLY — 36 items
BAG COUNTER SPONGE SURGICOUNT (BAG) IMPLANT
BLADE SURG 21 STRL SS (BLADE) ×2 IMPLANT
BNDG COHESIVE 6X5 TAN STRL LF (GAUZE/BANDAGES/DRESSINGS) IMPLANT
BNDG GAUZE ELAST 4 BULKY (GAUZE/BANDAGES/DRESSINGS) ×2 IMPLANT
COVER SURGICAL LIGHT HANDLE (MISCELLANEOUS) ×3 IMPLANT
DERMABOND ADVANCED (GAUZE/BANDAGES/DRESSINGS) ×2
DERMABOND ADVANCED .7 DNX12 (GAUZE/BANDAGES/DRESSINGS) IMPLANT
DRAPE DERMATAC (DRAPES) ×4 IMPLANT
DRAPE U-SHAPE 47X51 STRL (DRAPES) ×1 IMPLANT
DRSG ADAPTIC 3X8 NADH LF (GAUZE/BANDAGES/DRESSINGS) ×2 IMPLANT
DURAPREP 26ML APPLICATOR (WOUND CARE) ×2 IMPLANT
ELECT REM PT RETURN 9FT ADLT (ELECTROSURGICAL)
ELECTRODE REM PT RTRN 9FT ADLT (ELECTROSURGICAL) IMPLANT
GAUZE SPONGE 4X4 12PLY STRL (GAUZE/BANDAGES/DRESSINGS) ×2 IMPLANT
GLOVE SURG ORTHO LTX SZ9 (GLOVE) ×2 IMPLANT
GLOVE SURG UNDER POLY LF SZ9 (GLOVE) ×2 IMPLANT
GOWN STRL REUS W/ TWL XL LVL3 (GOWN DISPOSABLE) ×2 IMPLANT
GOWN STRL REUS W/TWL XL LVL3 (GOWN DISPOSABLE) ×4
GRAFT SKIN WND MICRO 38 (Tissue) ×1 IMPLANT
GRAFT SKIN WND OMEGA3 7X10 (Tissue) ×2 IMPLANT
GRAFT SKN 7X10XSTRL LF DISP (Tissue) IMPLANT
HANDPIECE INTERPULSE COAX TIP (DISPOSABLE)
KIT BASIN OR (CUSTOM PROCEDURE TRAY) ×2 IMPLANT
KIT TURNOVER KIT B (KITS) ×2 IMPLANT
MANIFOLD NEPTUNE II (INSTRUMENTS) ×2 IMPLANT
NS IRRIG 1000ML POUR BTL (IV SOLUTION) ×1 IMPLANT
PACK ORTHO EXTREMITY (CUSTOM PROCEDURE TRAY) ×2 IMPLANT
PAD ARMBOARD 7.5X6 YLW CONV (MISCELLANEOUS) ×2 IMPLANT
SET HNDPC FAN SPRY TIP SCT (DISPOSABLE) IMPLANT
STOCKINETTE IMPERVIOUS 9X36 MD (GAUZE/BANDAGES/DRESSINGS) IMPLANT
SUT ETHILON 2 0 PSLX (SUTURE) ×2 IMPLANT
SWAB COLLECTION DEVICE MRSA (MISCELLANEOUS) ×2 IMPLANT
SWAB CULTURE ESWAB REG 1ML (MISCELLANEOUS) IMPLANT
TOWEL GREEN STERILE (TOWEL DISPOSABLE) ×2 IMPLANT
TUBE CONNECTING 12X1/4 (SUCTIONS) ×2 IMPLANT
YANKAUER SUCT BULB TIP NO VENT (SUCTIONS) ×2 IMPLANT

## 2021-12-06 NOTE — Anesthesia Postprocedure Evaluation (Signed)
Anesthesia Post Note ? ?Patient: Marilyn [name] ? ?Procedure(s) Performed: LEFT SHOULDER DEBRIDEMENT AND WOUND CLOSURE (Left) ? ?  ? ?Patient location during evaluation: PACU ?Anesthesia Type: General ?Level of consciousness: awake and alert ?Pain management: pain level controlled ?Vital Signs Assessment: post-procedure vital signs reviewed and stable ?Respiratory status: spontaneous breathing, nonlabored ventilation and respiratory function stable ?Cardiovascular status: blood pressure returned to baseline and stable ?Postop Assessment: no apparent nausea or vomiting ?Anesthetic complications: no ? ? ?No notable events documented. ? ?Last Vitals:  ?Vitals:  ? 12/06/21 1330 12/06/21 1442  ?BP: 138/68 (!) 142/51  ?Pulse: 76 70  ?Resp: 16 16  ?Temp:  36.4 ?C  ?SpO2: 98% 96%  ?  ?Last Pain:  ?Vitals:  ? 12/06/21 1500  ?TempSrc:   ?PainSc: 0-No pain  ? ? ?  ?  ?  ?  ?  ?  ? ?Lucretia Kern ? ? ? ? ?

## 2021-12-06 NOTE — Transfer of Care (Signed)
Immediate Anesthesia Transfer of Care Note ? ?Patient: Marilyn [name] ? ?Procedure(s) Performed: LEFT SHOULDER DEBRIDEMENT AND WOUND CLOSURE (Left) ? ?Patient Location: PACU ? ?Anesthesia Type:General ? ?Level of Consciousness: awake and patient cooperative ? ?Airway & Oxygen Therapy: Patient Spontanous Breathing and Patient connected to face mask oxygen ? ?Post-op Assessment: Report given to RN and Post -op Vital signs reviewed and stable ? ?Post vital signs: Reviewed and stable ? ?Last Vitals:  ?Vitals Value Taken Time  ?BP 131/90 12/06/21 1300  ?Temp    ?Pulse 72 12/06/21 1301  ?Resp 15 12/06/21 1301  ?SpO2 99 % 12/06/21 1301  ?Vitals shown include unvalidated device data. ? ?Last Pain:  ?Vitals:  ? 12/06/21 1005  ?TempSrc: Oral  ?PainSc:   ?   ? ?Patients Stated Pain Goal: 0 (12/05/21 2106) ? ?Complications: No notable events documented. ?

## 2021-12-06 NOTE — Interval H&P Note (Signed)
History and Physical Interval Note: ? ?12/06/2021 ?7:18 AM ? ?Marilyn [name]  has presented today for surgery, with the diagnosis of Wound Left Shoulder.  The various methods of treatment have been discussed with the patient and family. After consideration of risks, benefits and other options for treatment, the patient has consented to  Procedure(s): ?LEFT SHOULDER DEBRIDEMENT AND WOUND CLOSURE (Left) as a surgical intervention.  The patient's history has been reviewed, patient examined, no change in status, stable for surgery.  I have reviewed the patient's chart and labs.  Questions were answered to the patient's satisfaction.   ? ? ?[name] ? ? ?

## 2021-12-06 NOTE — Anesthesia Preprocedure Evaluation (Signed)
Anesthesia Evaluation  ?Patient identified by MRN, date of birth, ID band ?Patient awake ? ? ? ?Reviewed: ?Allergy & Precautions, H&P , NPO status , Patient's Chart, lab work & pertinent test results ? ?Airway ?Mallampati: III ? ?TM Distance: >3 FB ?Neck ROM: Full ? ? ? Dental ?no notable dental hx. ?(+) Teeth Intact, Dental Advisory Given ?  ?Pulmonary ?shortness of breath and with exertion, asthma , sleep apnea , former smoker,  ?  ?Pulmonary exam normal ?breath sounds clear to auscultation ? ? ? ? ? ? Cardiovascular ?hypertension, Pt. on medications ? ?Rhythm:Regular Rate:Normal ? ? ?  ?Neuro/Psych ? Headaches, Anxiety Depression   ? GI/Hepatic ?Neg liver ROS, GERD  ,  ?Endo/Other  ?Hypothyroidism Morbid obesity ? Renal/GU ?Renal disease (Cr 1.41)  ?negative genitourinary ?  ?Musculoskeletal ? ?(+) Arthritis , Osteoarthritis,   ? Abdominal ?  ?Peds ? Hematology ? ?(+) Blood dyscrasia, anemia ,   ?Anesthesia Other Findings ? ? Reproductive/Obstetrics ?negative OB ROS ? ?  ? ? ? ? ? ? ? ? ? ? ? ? ? ?  ?  ? ? ? ? ? ? ? ?Anesthesia Physical ? ?Anesthesia Plan ? ?ASA: 3 ? ?Anesthesia Plan: General  ? ?Post-op Pain Management: Tylenol PO (pre-op)* and Gabapentin PO (pre-op)*  ? ?Induction: Intravenous ? ?PONV Risk Score and Plan: 3 and Ondansetron, Dexamethasone, Midazolam and Treatment may vary due to age or medical condition ? ?Airway Management Planned: LMA and Oral ETT ? ?Additional Equipment:  ? ?Intra-op Plan:  ? ?Post-operative Plan: Extubation in OR ? ?Informed Consent: I have reviewed the patients History and Physical, chart, labs and discussed the procedure including the risks, benefits and alternatives for the proposed anesthesia with the patient or authorized representative who has indicated his/her understanding and acceptance.  ? ? ? ?Dental advisory given ? ?Plan Discussed with: CRNA ? ?Anesthesia Plan Comments:   ? ? ? ? ? ?Anesthesia Quick Evaluation ? ?

## 2021-12-06 NOTE — Anesthesia Procedure Notes (Signed)
Procedure Name: LMA Insertion ?Date/Time: 12/06/2021 12:00 PM ?Performed by: Janene Harvey, CRNA ?Pre-anesthesia Checklist: Patient identified, Emergency Drugs available, Suction available and Patient being monitored ?Patient Re-evaluated:Patient Re-evaluated prior to induction ?Oxygen Delivery Method: Circle system utilized ?Preoxygenation: Pre-oxygenation with 100% oxygen ?Induction Type: IV induction ?LMA: LMA inserted ?LMA Size: 4.0 ?Placement Confirmation: positive ETCO2 ?Dental Injury: Teeth and Oropharynx as per pre-operative assessment  ? ? ? ? ?

## 2021-12-06 NOTE — Op Note (Signed)
12/06/2021 ? ?1:02 PM ? ?PATIENT:  Marilyn [name]   ? ?PRE-OPERATIVE DIAGNOSIS:  Wound Left Shoulder ? ?POST-OPERATIVE DIAGNOSIS:  Same ? ?Procedure: Debridement open wound left shoulder. ?Local tissue rearrangement for wound closure 19 x 5 cm. ?Application of Kerecis 10 x 7 cm graft and 38 cm? powder. ?Application of Prevena dressing 20 cm. ? ? ?SURGEON:  [name], MD ? ?PHYSICIAN ASSISTANT:None ?ANESTHESIA:   General ? ?PREOPERATIVE INDICATIONS:  Marilyn [name] is a  51 y.o. female with a diagnosis of Wound Left Shoulder who failed conservative measures and elected for surgical management.   ? ?The risks benefits and alternatives were discussed with the patient preoperatively including but not limited to the risks of infection, bleeding, nerve injury, cardiopulmonary complications, the need for revision surgery, among others, and the patient was willing to proceed. ? ?OPERATIVE IMPLANTS: Kerecis 7 x 10 cm, 38 cm? micro graft ? ?@ENCIMAGES @ ? ?OPERATIVE FINDINGS: Patient had good granulation tissue no signs of infection wound bed was healthy and viable. ? ?OPERATIVE PROCEDURE: Patient was brought the operating room and underwent a general anesthetic.  After adequate levels anesthesia obtained patient's left upper extremity was prepped using DuraPrep draped into a sterile field a timeout was called.  Elliptical incision was made around her previous incisional that left a wound 19 x 5 cm.  The tissue margins were healthy and viable electrocautery was used for hemostasis the wound was irrigated with normal saline.  The wound bed was then filled with 38 cm? of Kerecis powder this was then covered with a sheet 7 x 10 cm of the solid sheet.  Additional incisions were made and a local tissue rearrangement was performed 19 x 5 cm.  The wound was closed using 2-0 nylon.  The 20 cm Prevena wound VAC was applied this was outlined with derma tack this had a good suction fit patient was extubated taken the PACU in  stable condition. ? ?Debridement type: Excisional Debridement ? ?Side: left ? ?Body Location: shoulder  ? ?Tools used for debridement: scalpel and rongeur ? ?Pre-debridement Wound size (cm):   Length: 11        Width: 2     Depth: 1  ? ?Post-debridement Wound size (cm):   Length: 19        Width: 5     Depth: 2  ? ?Debridement depth beyond dead/damaged tissue down to healthy viable tissue: yes ? ?Tissue layer involved: skin, subcutaneous tissue, muscle / fascia ? ?Nature of tissue removed: Non-viable tissue ? ?Irrigation volume: 2 liters    ? ?Irrigation fluid type: Normal Saline ? ? ? ?DISCHARGE PLANNING: ? ?Antibiotic duration: Continue antibiotics ? ?Weightbearing: Not applicable ? ?Pain medication: Opioid pathway ? ?Dressing care/ Wound VAC: Continue wound VAC for 1 week ? ?Ambulatory devices: Walker or crutches as needed ? ?Discharge to: Anticipate discharge to home tomorrow ? ?Follow-up: In the office 1 week post operative. ? ? ? ? ? ? ?  ?

## 2021-12-07 ENCOUNTER — Encounter (HOSPITAL_COMMUNITY): Payer: Self-pay | Admitting: Orthopedic Surgery

## 2021-12-07 DIAGNOSIS — T8149XA Infection following a procedure, other surgical site, initial encounter: Secondary | ICD-10-CM | POA: Diagnosis not present

## 2021-12-07 DIAGNOSIS — Z96612 Presence of left artificial shoulder joint: Secondary | ICD-10-CM | POA: Diagnosis not present

## 2021-12-07 NOTE — Progress Notes (Signed)
PT Cancellation Note ? ?Patient Details ?Name: SHERITA DECOSTE ?MRN: [id number] ?DOB: 10-21-1970 ? ? ?Cancelled Treatment:    Reason Eval/Treat Not Completed: PT screened, no needs identified, will sign off. Working with acute OT related to shoulder; pt mod indep with mobility. Acute PT will sign off. ? ?Ina Homes, PT, DPT ?Acute Rehabilitation Services  ?Pager [phone] ?Office [phone] ? ?Malachy Chamber ?12/07/2021, 12:13 PM ?

## 2021-12-07 NOTE — Progress Notes (Signed)
Occupational Therapy Treatment ?Patient Details ?Name: Marilyn [name] ?MRN: [id number] ?DOB: 09-15-1971 ?Today's Date: 12/07/2021 ? ? ?History of present illness Patient is a 51 year old female admitted for L shoulder debridement and wound vac after reverse total shoulder arthroplasty on 3/2. Plans for further debridement on 3/22. PMH: HTN, depression, anxiety, morbid obesity, back surgery, B TKA, L THA, L rotator cuff repair ?  ?OT comments ? Pt was able to recall WB status and ability to demonstrate HEP for hand. Wrist and elbow. Pt educated on compensations on pendulums as pt reported feeling dizzy from position in last session and back pain. Pt was able to complete toilet tasks with min assist with LE as pt noted to increase use of LUE when they were not able to don over L hip.  Pt currently with functional limitations due to the deficits listed below (see OT Problem List).  Pt will benefit from skilled OT to increase their safety and independence with ADL and functional mobility for ADL to facilitate discharge to venue listed below.  ?  ? ?Recommendations for follow up therapy are one component of a multi-disciplinary discharge planning process, led by the attending physician.  Recommendations may be updated based on patient status, additional functional criteria and insurance authorization. ?   ?Follow Up Recommendations ? Follow physician's recommendations for discharge plan and follow up therapies  ?  ?Assistance Recommended at Discharge PRN  ?Patient can return home with the following ? A little help with bathing/dressing/bathroom;Assistance with cooking/housework ?  ?Equipment Recommendations ? None recommended by OT  ?  ?Recommendations for Other Services   ? ?  ?Precautions / Restrictions Precautions ?Precautions: Shoulder ?Type of Shoulder Precautions: ok for L pendulums, AROM elbow to hand, ER 0-30 ?Shoulder Interventions: Shoulder sling/immobilizer;Off for dressing/bathing/exercises ?Precaution  Booklet Issued: Yes (comment) ?Required Braces or Orthoses: Sling ?Restrictions ?Weight Bearing Restrictions: No ?LUE Weight Bearing: Weight bearing as tolerated  ? ? ?  ? ?Mobility Bed Mobility ?Overal bed mobility: Modified Independent ?  ?  ?  ?  ?  ?  ?General bed mobility comments: HOB elevated and pt reported they would be able to go into recliner at night in home ?  ? ?Transfers ?Overall transfer level: Modified independent ?Equipment used: None ?  ?  ?  ?  ?  ?  ?  ?General transfer comment: Pt takes increase in time for all transfers ?  ?  ?Balance Overall balance assessment: Modified Independent ?  ?  ?  ?  ?  ?  ?  ?  ?  ?  ?  ?  ?  ?  ?  ?  ?  ?  ?   ? ?ADL either performed or assessed with clinical judgement  ? ?ADL Overall ADL's : Needs assistance/impaired ?Eating/Feeding: Independent;Sitting ?  ?Grooming: Independent;Standing ?  ?Upper Body Bathing: Minimal assistance;Sitting ?  ?Lower Body Bathing: Minimal assistance;Sitting/lateral leans ?  ?Upper Body Dressing : Minimal assistance;Sitting ?  ?Lower Body Dressing: Sit to/from stand;Minimal assistance ?  ?Toilet Transfer: Modified Independent ?  ?Toileting- Clothing Manipulation and Hygiene: Modified independent;Sit to/from stand ?  ?  ?  ?Functional mobility during ADLs: Modified independent ?  ?  ? ?Extremity/Trunk Assessment Upper Extremity Assessment ?Upper Extremity Assessment: LUE deficits/detail ?LUE Deficits / Details: Educated in shoulder pendulums, AROM elbow to hand ?LUE Sensation: WNL ?LUE Coordination: decreased gross motor ?  ?Lower Extremity Assessment ?Lower Extremity Assessment: Defer to PT evaluation ?  ?  ?  ? ?  Vision   ?  ?  ?Perception   ?  ?Praxis   ?  ? ?Cognition Arousal/Alertness: Awake/alert ?Behavior During Therapy: [facility] for tasks assessed/performed ?Overall Cognitive Status: Within Functional Limits for tasks assessed ?  ?  ?  ?  ?  ?  ?  ?  ?  ?  ?  ?  ?  ?  ?  ?  ?  ?  ?  ?   ?Exercises Shoulder Exercises ?Pendulum  Exercise: PROM, Left, 20 reps (seat due to feeling dizzy and back pain from last time) ? ?  ?Shoulder Instructions Shoulder Instructions ?Donning/doffing sling/immobilizer: Minimal assistance ?Correct positioning of sling/immobilizer: Min-guard ?Pendulum exercises (written home exercise program): Supervision/safety ?ROM for elbow, wrist and digits of operated UE: Supervision/safety ? ? ?  ?General Comments    ? ? ?Pertinent Vitals/ Pain       Pain Assessment ?Pain Assessment: 0-10 ?Pain Score: 2  ?Pain Location: L shoulder ?Pain Descriptors / Indicators: Discomfort ?Pain Intervention(s): Monitored during session, Repositioned ? ?Home Living   ?  ?  ?  ?  ?  ?  ?  ?  ?  ?  ?  ?  ?  ?  ?  ?  ?  ?  ? ?  ?Prior Functioning/Environment    ?  ?  ?  ?   ? ?Frequency ? Min 2X/week  ? ? ? ? ?  ?Progress Toward Goals ? ?OT Goals(current goals can now be found in the care plan section) ? Progress towards OT goals: Progressing toward goals ? ?Acute Rehab OT Goals ?Patient Stated Goal: to get back to normal ?OT Goal Formulation: With patient ?Time For Goal Achievement: 12/16/21 ?Potential to Achieve Goals: Good ?ADL Goals ?Pt Will Perform Upper Body Bathing: with modified independence;sitting;standing ?Pt Will Perform Upper Body Dressing: with modified independence;standing;sitting ?Pt/caregiver will Perform Home Exercise Program: Left upper extremity;Independently;With written HEP provided  ?Plan Discharge plan remains appropriate   ? ?Co-evaluation ? ? ?   ?  ?  ?  ?  ? ?  ?AM-PAC OT "6 Clicks" Daily Activity     ?Outcome Measure ? ? Help from another person eating meals?: None ?Help from another person taking care of personal grooming?: None ?Help from another person toileting, which includes using toliet, bedpan, or urinal?: None ?Help from another person bathing (including washing, rinsing, drying)?: A Little ?Help from another person to put on and taking off regular upper body clothing?: A Little ?Help from another person  to put on and taking off regular lower body clothing?: A Little ?6 Click Score: 21 ? ?  ?End of Session Equipment Utilized During Treatment:  (sling) ? ?OT Visit Diagnosis: Pain;Muscle weakness (generalized) (M62.81) ?Pain - Right/Left: Left ?Pain - part of body: Shoulder ?  ?Activity Tolerance Patient tolerated treatment well ?  ?Patient Left in chair;with call bell/phone within reach ?  ?Nurse Communication   ?  ? ?   ? ?Time: 0821-0911 ?OT Time Calculation (min): 50 min ? ?Charges: OT General Charges ?$OT Visit: 1 Visit ?OT Treatments ?$Self Care/Home Management : 38-52 mins ? ?Alphia Moh OTR/L  ?Acute Rehab Services  ?[phone] office number ?[phone] pager number ? ? ?Alphia Moh ?12/07/2021, 9:19 AM ?

## 2021-12-07 NOTE — Progress Notes (Signed)
Mobility Specialist Progress Note  ? ? 12/07/21 1404  ?Mobility  ?Activity Ambulated independently in hallway  ?Level of Assistance Modified independent, requires aide device or extra time  ?Assistive Device [place]  ?Distance Ambulated (ft) 230 ft  ?Activity Response Tolerated well  ?$Mobility charge 1 Mobility  ? ?Pt received in chair and agreeable. No complaints. Took x1 seated rest break. Returned to chair with call bell in reach.  ? ?[place] Nation ?Mobility Specialist  ?  ?

## 2021-12-07 NOTE — Plan of Care (Signed)

## 2021-12-07 NOTE — Plan of Care (Signed)
  Problem: Education: Goal: Knowledge of General Education information will improve Description: Including pain rating scale, medication(s)/side effects and non-pharmacologic comfort measures Outcome: Not Progressing   Problem: Health Behavior/Discharge Planning: Goal: Ability to manage health-related needs will improve Outcome: Not Progressing   Problem: Clinical Measurements: Goal: Ability to maintain clinical measurements within normal limits will improve Outcome: Not Progressing Goal: Will remain free from infection Outcome: Not Progressing Goal: Diagnostic test results will improve Outcome: Not Progressing Goal: Respiratory complications will improve Outcome: Not Progressing Goal: Cardiovascular complication will be avoided Outcome: Not Progressing   Problem: Nutrition: Goal: Adequate nutrition will be maintained Outcome: Not Progressing   Problem: Coping: Goal: Level of anxiety will decrease Outcome: Not Progressing   Problem: Elimination: Goal: Will not experience complications related to bowel motility Outcome: Not Progressing Goal: Will not experience complications related to urinary retention Outcome: Not Progressing   

## 2021-12-07 NOTE — Plan of Care (Signed)

## 2021-12-07 NOTE — Progress Notes (Signed)
Patient ID: Marilyn [name], female   DOB: [birth year], 51 y.o.   MRN: [id number] ?Patient is a 51 year old woman who presents in follow-up status post further debridement of the left shoulder wound with tissue transfer for closure and application of Kerecis tissue graft and micro powder.  There is 50 cc in the wound VAC canister there is not a good suction fit but the large pump is working fine.  Anticipate patient could discharge tomorrow I will need to reinforce this dressing. ?

## 2021-12-07 NOTE — Evaluation (Signed)
Pt wound vac indicating a blockage in the line. No oozing or leaking from incision site. 50 ml output from sift change remains in the chamber. Pt reports no additional discomfort.  Ortho contacted via p: [phone]. Information provided to to receptionist to relay to physician. Awaiting feedback.  ?

## 2021-12-07 NOTE — Progress Notes (Signed)
Diagnosis: ?Superficial surgical site infection ? ?Culture Result: operative cx ngtd ? ? ?Recs: ?Finish 10 day from 3/21 abx with doxycycline and cefdinir ?ID follow up as below ?Crp tomorrow ?Discussed with primary team ?Ok to discharge from id standpoint once ready with surgery team ? ? ? ?Clinic Follow Up Appt: ?3/29 @ 345 with dr Renold Don ? ?@ ? ?[facility] ?[address], [place], [place] [address] ?Phone: [phone] ? ? ?-------- ?Subjective: ?Doing well ?No n/v/diarrhea ?Both office and hospital wound cx ngtd ? ? ?Exam: ?Vitals:  ? 12/06/21 2022 12/07/21 0849  ?BP: (!) 103/53 101/88  ?Pulse: 72 71  ?Resp: 18 17  ?Temp: 98.1 ?F (36.7 ?C) 98.1 ?F (36.7 ?C)  ?SpO2: 100% 100%  ? ?General/constitutional: no distress, pleasant ?HEENT: Normocephalic, PER, Conj Clear, EOMI, Oropharynx clear ?Neck supple ?CV: rrr no mrg ?Lungs: clear to auscultation, normal respiratory effort ?Abd: Soft, Nontender ?Ext: no edema ?Skin: left shoulder incision with wound vac in place draining serosanuinous fluid (minimal) ?Neuro: nonfocal ?MSK: no peripheral joint swelling/tenderness/warmth; back spines nontender ? ? ?Labs: ?Lab Results  ?Component Value Date  ? WBC 9.1 12/03/2021  ? HGB 8.2 (L) 12/03/2021  ? HCT 25.9 (L) 12/03/2021  ? MCV 90.6 12/03/2021  ? PLT 315 12/03/2021  ? ?Last metabolic panel ?Lab Results  ?Component Value Date  ? GLUCOSE 186 (H) 12/02/2021  ? NA 136 12/02/2021  ? K 3.8 12/02/2021  ? CL 98 12/02/2021  ? CO2 25 12/02/2021  ? BUN 11 12/02/2021  ? CREATININE 1.41 (H) 12/02/2021  ? GFRNONAA 45 (L) 12/02/2021  ? CALCIUM 9.0 12/02/2021  ? PHOS 1.5 (L) 05/20/2019  ? PROT 7.7 06/17/2019  ? ALBUMIN 3.9 06/17/2019  ? BILITOT 0.6 06/17/2019  ? ALKPHOS 102 06/17/2019  ? AST 32 06/17/2019  ? ALT 37 06/17/2019  ? ANIONGAP 13 12/02/2021  ? ? ? ?   ?Component Value Date/Time  ? CRP 2.5 (H) 12/04/2021 1650  ? CRP 30.7 (H) 01/08/2019 1028  ? CRP 16.3 (H) 09/18/2018 1540  ? ? ? ? ?

## 2021-12-08 ENCOUNTER — Other Ambulatory Visit: Payer: Self-pay | Admitting: Surgical

## 2021-12-08 ENCOUNTER — Encounter: Payer: Medicare Other | Admitting: Orthopedic Surgery

## 2021-12-08 ENCOUNTER — Telehealth: Payer: Self-pay | Admitting: Surgical

## 2021-12-08 LAB — C-REACTIVE PROTEIN: CRP: 1.8 mg/dL — ABNORMAL HIGH (ref <1.0)

## 2021-12-08 MED ORDER — CEFDINIR 300 MG PO CAPS: 300.0000 mg | ORAL_CAPSULE | Freq: Two times a day (BID) | ORAL | 0 refills | Status: AC

## 2021-12-08 MED ORDER — OXYCODONE HCL 5 MG PO TABS
5.0000 mg | ORAL_TABLET | ORAL | 0 refills | Status: DC | PRN
Start: 2021-12-08 — End: 2021-12-11

## 2021-12-08 MED ORDER — OXYCODONE HCL 10 MG PO TABS: 10.0000 mg | ORAL_TABLET | ORAL | 0 refills | Status: DC | PRN

## 2021-12-08 MED ORDER — DOXYCYCLINE HYCLATE 100 MG PO TABS: 100.0000 mg | ORAL_TABLET | Freq: Two times a day (BID) | ORAL | 0 refills | Status: AC

## 2021-12-08 NOTE — Progress Notes (Signed)
Occupational Therapy Treatment ?Patient Details ?Name: Marilyn [name] ?MRN: [id number] ?DOB: 1971/04/29 ?Today's Date: 12/08/2021 ? ? ?History of present illness Patient is a 51 year old female admitted for L shoulder debridement and wound vac after reverse total shoulder arthroplasty on 3/2. Plans for further debridement on 3/22. PMH: HTN, depression, anxiety, morbid obesity, back surgery, B TKA, L THA, L rotator cuff repair ?  ?OT comments ? Patient continues to make steady progress towards goals in skilled OT session. Patient's session encompassed  therapeutic exercises to L shoulder and reviewing education for safe discharge home. Patient able to complete pendulums and external rotation exercises with no need for cues for completion, and able to verbalize remaining exercises to promote increased mobility within current ROM limitations. OT will continue to follow acutely.   ? ?Recommendations for follow up therapy are one component of a multi-disciplinary discharge planning process, led by the attending physician.  Recommendations may be updated based on patient status, additional functional criteria and insurance authorization. ?   ?Follow Up Recommendations ? Follow physician's recommendations for discharge plan and follow up therapies  ?  ?Assistance Recommended at Discharge PRN  ?Patient can return home with the following ? A little help with bathing/dressing/bathroom;Assistance with cooking/housework ?  ?Equipment Recommendations ? None recommended by OT  ?  ?Recommendations for Other Services   ? ?  ?Precautions / Restrictions Precautions ?Precautions: Shoulder ?Type of Shoulder Precautions: ok for L pendulums, AROM elbow to hand, ER 0-30 ?Shoulder Interventions: Shoulder sling/immobilizer;Off for dressing/bathing/exercises ?Precaution Booklet Issued: Yes (comment) ?Required Braces or Orthoses: Sling ?Restrictions ?Weight Bearing Restrictions: No ?LUE Weight Bearing: Weight bearing as tolerated  ? ? ?   ? ?Mobility Bed Mobility ?  ?  ?  ?  ?  ?  ?  ?General bed mobility comments: Patient up and walking in room upon arrival ?  ? ?Transfers ?Overall transfer level: Modified independent ?Equipment used: None ?  ?  ?  ?  ?  ?  ?  ?  ?  ?  ?Balance Overall balance assessment: Modified Independent ?  ?  ?  ?  ?  ?  ?  ?  ?  ?  ?  ?  ?  ?  ?  ?  ?  ?  ?   ? ?ADL either performed or assessed with clinical judgement  ? ?ADL Overall ADL's : Needs assistance/impaired ?  ?  ?  ?  ?  ?  ?  ?  ?  ?  ?  ?  ?  ?  ?  ?  ?  ?  ?  ?General ADL Comments: Patient dressed upon arrival, session focus on pendulum exercises and reviewing of precautions for safe discharge home ?  ? ?Extremity/Trunk Assessment   ?  ?  ?  ?  ?  ? ?Vision   ?  ?  ?Perception   ?  ?Praxis   ?  ? ?Cognition Arousal/Alertness: Awake/alert ?Behavior During Therapy: [facility] for tasks assessed/performed ?Overall Cognitive Status: Within Functional Limits for tasks assessed ?  ?  ?  ?  ?  ?  ?  ?  ?  ?  ?  ?  ?  ?  ?  ?  ?  ?  ?  ?   ?Exercises Other Exercises ?Other Exercises: Pendulums x20 reps in all directions ?Other Exercises: ER x10 to 30 degress ? ?  ?Shoulder Instructions   ? ? ?  ?General Comments    ? ? ?  Pertinent Vitals/ Pain       Pain Assessment ?Pain Assessment: Faces ?Faces Pain Scale: Hurts a little bit ?Pain Location: L shoulder ?Pain Descriptors / Indicators: Discomfort, Sore ?Pain Intervention(s): Limited activity within patient's tolerance, Monitored during session, Repositioned ? ?Home Living   ?  ?  ?  ?  ?  ?  ?  ?  ?  ?  ?  ?  ?  ?  ?  ?  ?  ?  ? ?  ?Prior Functioning/Environment    ?  ?  ?  ?   ? ?Frequency ? Min 2X/week  ? ? ? ? ?  ?Progress Toward Goals ? ?OT Goals(current goals can now be found in the care plan section) ? Progress towards OT goals: Progressing toward goals ? ?Acute Rehab OT Goals ?Patient Stated Goal: to get back home ?OT Goal Formulation: With patient ?Time For Goal Achievement: 12/16/21 ?Potential to Achieve Goals: Good   ?Plan Discharge plan remains appropriate   ? ?Co-evaluation ? ? ?   ?  ?  ?  ?  ? ?  ?AM-PAC OT "6 Clicks" Daily Activity     ?Outcome Measure ? ? Help from another person eating meals?: None ?Help from another person taking care of personal grooming?: None ?Help from another person toileting, which includes using toliet, bedpan, or urinal?: None ?Help from another person bathing (including washing, rinsing, drying)?: A Little ?Help from another person to put on and taking off regular upper body clothing?: A Little ?Help from another person to put on and taking off regular lower body clothing?: A Little ?6 Click Score: 21 ? ?  ?End of Session   ? ?OT Visit Diagnosis: Pain;Muscle weakness (generalized) (M62.81) ?Pain - Right/Left: Left ?Pain - part of body: Shoulder ?  ?Activity Tolerance Patient tolerated treatment well ?  ?Patient Left in chair;with call bell/phone within reach ?  ?Nurse Communication Mobility status ?  ? ?   ? ?Time: 2751-7001 ?OT Time Calculation (min): 17 min ? ?Charges: OT General Charges ?$OT Visit: 1 Visit ?OT Treatments ?$Therapeutic Exercise: 8-22 mins ? ?Pollyann Glen E. Refael Fulop, OTR/L ?Acute Rehabilitation Services ?[phone] ?[phone]  ? ?Pollyann Glen Aribella Vavra ?12/08/2021, 3:51 PM ?

## 2021-12-08 NOTE — Telephone Encounter (Signed)
[facility] called and states that they do not have oxy 10 available, they are on back order. They do have oxy 5s if you would like to send that for the pt ? ?Cb [phone] ?

## 2021-12-08 NOTE — Plan of Care (Signed)
?  Problem: Education: ?Goal: Knowledge of General Education information will improve ?Description: Including pain rating scale, medication(s)/side effects and non-pharmacologic comfort measures ?Outcome: Adequate for Discharge ?  ?Problem: Health Behavior/Discharge Planning: ?Goal: Ability to manage health-related needs will improve ?Outcome: Adequate for Discharge ?  ?Problem: Clinical Measurements: ?Goal: Ability to maintain clinical measurements within normal limits will improve ?Outcome: Adequate for Discharge ?Goal: Will remain free from infection ?Outcome: Adequate for Discharge ?Goal: Diagnostic test results will improve ?Outcome: Adequate for Discharge ?Goal: Respiratory complications will improve ?Outcome: Adequate for Discharge ?Goal: Cardiovascular complication will be avoided ?Outcome: Adequate for Discharge ?  ?Problem: Activity: ?Goal: Risk for activity intolerance will decrease ?Outcome: Adequate for Discharge ?  ?Problem: Nutrition: ?Goal: Adequate nutrition will be maintained ?Outcome: Adequate for Discharge ?  ?Problem: Coping: ?Goal: Level of anxiety will decrease ?Outcome: Adequate for Discharge ?  ?Problem: Elimination: ?Goal: Will not experience complications related to bowel motility ?Outcome: Adequate for Discharge ?Goal: Will not experience complications related to urinary retention ?Outcome: Adequate for Discharge ?  ?Problem: Pain Managment: ?Goal: General experience of comfort will improve ?Outcome: Adequate for Discharge ?  ?Problem: Safety: ?Goal: Ability to remain free from injury will improve ?Outcome: Adequate for Discharge ?  ?Problem: Skin Integrity: ?Goal: Risk for impaired skin integrity will decrease ?Outcome: Adequate for Discharge ?  ?Problem: Acute Rehab OT Goals (only OT should resolve) ?Goal: Pt. Will Perform Upper Body Bathing ?Outcome: Adequate for Discharge ?Goal: Pt. Will Perform Upper Body Dressing ?Outcome: Adequate for Discharge ?Goal: Pt/Caregiver Will Perform Home  Exercise Program ?Outcome: Adequate for Discharge ?  ?

## 2021-12-08 NOTE — Progress Notes (Signed)
Patient stable.  No complaint of chest pain, shortness of breath, abdominal pain.  Only complains of shoulder pain.  Denies any fevers or chills.  Wound VAC hose was clogged and this was attempted to be dislodged earlier this morning with a spinal needle and use of saline but without success.  Discussed this with Dr. Lajoyce Corners.  He recommended removal of the wound VAC and placing of an impervious dressing.  Aquacel was placed over the incision after removal of wound VAC today.  Patient tolerated the dressing change well.  She is ready for discharge home.  Plan for follow-up with Dr. August Saucer in clinic next Friday and close follow-up with infectious disease for monitoring of her CRP; appreciate their involvement and expertise. ?

## 2021-12-08 NOTE — Telephone Encounter (Signed)
Sent in Oxycodone 5mg  1-2 tabs as needed q4h

## 2021-12-08 NOTE — Care Management Important Message (Signed)
Important Message ? ?Patient Details  ?Name: Marilyn [name] ?MRN: [id number] ?Date of Birth: 1970-11-12 ? ? ?Medicare Important Message Given:  Yes ? ? ? ? ?[name]  Lorrie Gargan-Martin ?12/08/2021, 2:15 PM ?

## 2021-12-08 NOTE — Progress Notes (Signed)
Patient given discharge instructions and stated understanding. 

## 2021-12-08 NOTE — Progress Notes (Signed)
Mobility Specialist: Progress Note ? ? 12/08/21 1340  ?Mobility  ?Activity Ambulated with assistance in hallway  ?Level of Assistance Modified independent, requires aide device or extra time  ?Assistive Device [place]  ?Distance Ambulated (ft) 410 ft ?(205'x2)  ?Activity Response Tolerated well  ?$Mobility charge 1 Mobility  ? ?Pt received in chair and agreeable to ambulation. Stopped x1 for seated break halfway through session d/t general fatigue. C/o some L shoulder pain and muscle spasms when sitting. Pt back to recliner after session with call bell and phone in reach. ? ?Cristal Deer Keeley Sussman ?Mobility Specialist ?Mobility Specialist 5 North: [phone] ?Mobility Specialist 6 North: [phone] ? ?

## 2021-12-08 NOTE — Plan of Care (Signed)

## 2021-12-11 ENCOUNTER — Other Ambulatory Visit: Payer: Self-pay | Admitting: Surgical

## 2021-12-11 ENCOUNTER — Telehealth: Payer: Self-pay | Admitting: Orthopedic Surgery

## 2021-12-11 ENCOUNTER — Other Ambulatory Visit: Payer: Self-pay

## 2021-12-11 ENCOUNTER — Ambulatory Visit (INDEPENDENT_AMBULATORY_CARE_PROVIDER_SITE_OTHER): Payer: Medicare Other | Admitting: Surgical

## 2021-12-11 DIAGNOSIS — Z96612 Presence of left artificial shoulder joint: Secondary | ICD-10-CM

## 2021-12-11 MED ORDER — OXYCODONE-ACETAMINOPHEN 10-325 MG PO TABS: 1.0000 | ORAL_TABLET | ORAL | 0 refills | Status: DC | PRN

## 2021-12-11 NOTE — Telephone Encounter (Signed)
Called Joe and discussed

## 2021-12-11 NOTE — Telephone Encounter (Signed)
Received call from Joe-pharmacist with [facility] advised a Rx was filled for Oxycodone Friday. Joe said they just found out that the patient is seeing a pain specialist and currently received a Rx for Percocet 7.5 mg to take 3 times a day. Joe said he wanted to make Dr. August Saucer aware. The number to contact Gabriel Rung is [phone] ?

## 2021-12-13 ENCOUNTER — Ambulatory Visit (INDEPENDENT_AMBULATORY_CARE_PROVIDER_SITE_OTHER): Payer: Medicare Other | Admitting: Surgical

## 2021-12-13 ENCOUNTER — Encounter (HOSPITAL_COMMUNITY): Payer: Self-pay | Admitting: Orthopedic Surgery

## 2021-12-13 ENCOUNTER — Other Ambulatory Visit: Payer: Self-pay

## 2021-12-13 ENCOUNTER — Inpatient Hospital Stay: Payer: Medicare Other | Admitting: Internal Medicine

## 2021-12-13 DIAGNOSIS — Z96612 Presence of left artificial shoulder joint: Secondary | ICD-10-CM

## 2021-12-14 ENCOUNTER — Encounter: Payer: Self-pay | Admitting: Orthopedic Surgery

## 2021-12-14 ENCOUNTER — Ambulatory Visit (INDEPENDENT_AMBULATORY_CARE_PROVIDER_SITE_OTHER): Payer: Medicare Other | Admitting: Orthopedic Surgery

## 2021-12-14 DIAGNOSIS — S41002D Unspecified open wound of left shoulder, subsequent encounter: Secondary | ICD-10-CM

## 2021-12-14 NOTE — Discharge Summary (Signed)
Physician Discharge Summary  ? ? ? ? ?Patient ID: ?Seward Speck Minardi ?MRN: [id number] ?DOB/AGE: 1970-11-24 51 y.o. ? ?Admit date: 12/01/2021 ?Discharge date: 12/08/2021 ? ?Admission Diagnoses:  ?Principal Problem: ?  S/P reverse total shoulder arthroplasty, left ?Active Problems: ?  Surgical site infection ? ? ?Discharge Diagnoses:  ?Same ? ?Surgeries: Procedure(s): ?LEFT SHOULDER DEBRIDEMENT AND WOUND CLOSURE on 12/06/2021 ?  ?Consultants: Treatment Team:  ?[name], MD ? ?Discharged Condition: Stable ? ?Hospital Course: BRIELLAH BAIK is an 51 y.o. female who was admitted 12/01/2021 with a chief complaint of left shoulder pain with wound drainage, and found to have a diagnosis of left shoulder superficial infection.  They were brought to the operating room on 12/06/2021 and underwent the above named procedures.  Pt awoke from anesthesia without complication and was transferred to the floor. On POD1, patient's pain was overall controlled and she had no fevers, chills, night sweats.  Wound VAC was intact with good suction.  She was subsequently taken back to the operating room by Dr. Lajoyce Corners for a second debridement with wound closure and VAC placement on 12/06/2021 following her initial debridement on 12/01/2021.  She did well after her second debridement despite continuing to deal with moderate pain.  Continues to deny any fevers, chills, night sweats.  The Prevena wound VAC was clogged with a very dense 8 cm clot of blood that was attempted to be freed and broken up with a spinal needle and sterile saline flushes with no avail.  Dr. Lajoyce Corners recommended wound VAC removal and dry dressings so this Prevena VAC was removed and replaced with Aquacel dressing prior to her discharge on 12/08/2021.[name]  Pt will f/u with Dr. August Saucer in clinic in ~1 weeks.  ? ?Antibiotics given:  ?Anti-infectives (From admission, onward)  ? ? Start     Dose/Rate Route Frequency Ordered Stop  ? 12/08/21 0000  cefdinir (OMNICEF) 300 MG capsule       ?  300 mg Oral Every 12 hours 12/08/21 1353 12/14/21 2359  ? 12/08/21 0000  doxycycline (VIBRA-TABS) 100 MG tablet       ? 100 mg Oral Every 12 hours 12/08/21 1353 12/15/21 2359  ? 12/06/21 1500  ceFAZolin (ANCEF) IVPB 2g/100 mL premix  Status:  Discontinued       ? 2 g ?200 mL/hr over 30 Minutes Intravenous Every 6 hours 12/06/21 1405 12/06/21 1434  ? 12/06/21 1015  ceFAZolin (ANCEF) IVPB 3g/100 mL premix       ? 3 g ?200 mL/hr over 30 Minutes Intravenous On call to O.R. 12/06/21 1003 12/06/21 1218  ? 12/04/21 2200  doxycycline (VIBRA-TABS) tablet 100 mg  Status:  Discontinued       ? 100 mg Oral Every 12 hours 12/04/21 1601 12/08/21 2048  ? 12/04/21 2200  cefdinir (OMNICEF) capsule 300 mg  Status:  Discontinued       ? 300 mg Oral Every 12 hours 12/04/21 1601 12/08/21 2048  ? 12/02/21 0200  ceFAZolin (ANCEF) IVPB 2g/100 mL premix       ? 2 g ?200 mL/hr over 30 Minutes Intravenous Every 8 hours 12/01/21 2047 12/03/21 0551  ? 12/01/21 1752  vancomycin (VANCOCIN) powder  Status:  Discontinued       ?   As needed 12/01/21 1755 12/01/21 1846  ? 12/01/21 1715  vancomycin (VANCOREADY) IVPB 1500 mg/300 mL       ? 1,500 mg ?150 mL/hr over 120 Minutes Intravenous To Surgery 12/01/21 1702 12/01/21 1930  ?  12/01/21 1715  ceFAZolin (ANCEF) IVPB 3g/100 mL premix       ? 3 g ?200 mL/hr over 30 Minutes Intravenous To Surgery 12/01/21 1702 12/01/21 1800  ? ?  ?. ? ?Recent vital signs:  ?Vitals:  ? 12/07/21 2040 12/08/21 0814  ?BP: 124/63 (!) 125/46  ?Pulse: 66 68  ?Resp: 16 17  ?Temp: 98.4 ?F (36.9 ?C) 98.4 ?F (36.9 ?C)  ?SpO2: 100% 98%  ? ? ?Recent laboratory studies:  ?Results for orders placed or performed during the hospital encounter of 12/01/21  ?Surgical PCR Screen  ? Specimen: Nasal Mucosa; Nasal Swab  ?Result Value Ref Range  ? MRSA, PCR NEGATIVE NEGATIVE  ? Staphylococcus aureus NEGATIVE NEGATIVE  ?Aerobic/Anaerobic Culture w Gram Stain (surgical/deep wound)  ? Specimen: Wound  ?Result Value Ref Range  ? Specimen  Description WOUND   ? Special Requests LEFT ARM SUPERFICIAL WOUND   ? Gram Stain    ?  RARE WBC PRESENT,BOTH PMN AND MONONUCLEAR ?NO ORGANISMS SEEN ?  ? Culture    ?  No growth aerobically or anaerobically. ?Performed at [facility], [address] [place], [place] [address] ?  ? Report Status 12/06/2021 FINAL   ?Aerobic/Anaerobic Culture w Gram Stain (surgical/deep wound)  ? Specimen: Wound  ?Result Value Ref Range  ? Specimen Description WOUND   ? Special Requests LEFT ARM SUPERFICIAL WOUND   ? Gram Stain    ?  FEW WBC PRESENT,BOTH PMN AND MONONUCLEAR ?NO ORGANISMS SEEN ?  ? Culture    ?  No growth aerobically or anaerobically. ?Performed at [facility], [address] [place], [place] [address] ?  ? Report Status 12/06/2021 FINAL   ?CBC  ?Result Value Ref Range  ? WBC 7.3 4.0 - 10.5 K/uL  ? RBC 3.12 (L) 3.87 - 5.11 MIL/uL  ? Hemoglobin 8.7 (L) 12.0 - 15.0 g/dL  ? HCT 28.1 (L) 36.0 - 46.0 %  ? MCV 90.1 80.0 - 100.0 fL  ? MCH 27.9 26.0 - 34.0 pg  ? MCHC 31.0 30.0 - 36.0 g/dL  ? RDW 14.6 11.5 - 15.5 %  ? Platelets 326 150 - 400 K/uL  ? nRBC 0.0 0.0 - 0.2 %  ?Basic metabolic panel  ?Result Value Ref Range  ? Sodium 136 135 - 145 mmol/L  ? Potassium 3.8 3.5 - 5.1 mmol/L  ? Chloride 98 98 - 111 mmol/L  ? CO2 25 22 - 32 mmol/L  ? Glucose, Bld 186 (H) 70 - 99 mg/dL  ? BUN 11 6 - 20 mg/dL  ? Creatinine, Ser 1.41 (H) 0.44 - 1.00 mg/dL  ? Calcium 9.0 8.9 - 10.3 mg/dL  ? GFR, Estimated 45 (L) >60 mL/min  ? Anion gap 13 5 - 15  ?CBC  ?Result Value Ref Range  ? WBC 9.1 4.0 - 10.5 K/uL  ? RBC 2.86 (L) 3.87 - 5.11 MIL/uL  ? Hemoglobin 8.2 (L) 12.0 - 15.0 g/dL  ? HCT 25.9 (L) 36.0 - 46.0 %  ? MCV 90.6 80.0 - 100.0 fL  ? MCH 28.7 26.0 - 34.0 pg  ? MCHC 31.7 30.0 - 36.0 g/dL  ? RDW 14.5 11.5 - 15.5 %  ? Platelets 315 150 - 400 K/uL  ? nRBC 0.0 0.0 - 0.2 %  ?C-reactive protein  ?Result Value Ref Range  ? CRP 2.5 (H) <1.0 mg/dL  ?Sedimentation rate  ?Result Value Ref Range  ? Sed Rate 70 (H) 0 - 22 mm/hr  ?C-reactive  protein  ?Result Value Ref Range  ? CRP 1.8 (H) <1.0 mg/dL  ? ? ?Discharge Medications:   ?Allergies as of 12/08/2021   ? ?   Reactions  ? Lisinopril Anaphylaxis  ? Angioedema  ? Bee Venom Swelling, Other (See Comments)  ? Swelling at the site  ? Bupropion Swelling  ? Latex Itching, Rash  ? Propoxyphene Hives  ? Methadone Other (See Comments)  ? unknown  ? Codeine Nausea Only  ? Meloxicam Other (See Comments)  ? Insomnia, constipation  ? Morphine And Related Rash  ? Tegaderm Ag Mesh [silver] Hives, Itching, Rash  ? Tomato Rash  ? ?  ? ?  ?Medication List  ?  ? ?STOP taking these medications   ? ?Oxycodone HCl 10 MG Tabs ?  ?oxyCODONE-acetaminophen 7.5-325 MG tablet ?Commonly known as: PERCOCET ?  ? ?  ? ?TAKE these medications   ? ?albuterol 108 (90 Base) MCG/ACT inhaler ?Commonly known as: VENTOLIN HFA ?Inhale 1-2 puffs into the lungs every 6 (six) hours as needed for wheezing or shortness of breath. ?  ?allopurinol 100 MG tablet ?Commonly known as: ZYLOPRIM ?Take 1 tablet by mouth twice daily ?  ?ammonium lactate 12 % lotion ?Commonly known as: LAC-HYDRIN ?Apply 1 application. topically 2 (two) times daily as needed for dry skin. ?  ?ascorbic Acid 500 MG Cpcr ?Commonly known as: VITAMIN C ?Take 500 mg by mouth daily. Gummy ?  ?aspirin 81 MG EC tablet ?Take 1 tablet (81 mg total) by mouth daily. Swallow whole. ?  ?buPROPion 300 MG 24 hr tablet ?Commonly known as: WELLBUTRIN XL ?Take 300 mg by mouth daily. ?  ?cefdinir 300 MG capsule ?Commonly known as: OMNICEF ?Take 1 capsule (300 mg total) by mouth every 12 (twelve) hours for 6 days. Stop date 12/14/21 ?  ?cetirizine 10 MG tablet ?Commonly known as: ZYRTEC ?Take 10 mg by mouth daily. ?  ?clotrimazole-betamethasone cream ?Commonly known as: LOTRISONE ?Apply 1 application topically daily as needed (irritation). ?  ?cyclobenzaprine 10 MG tablet ?Commonly known as: FLEXERIL ?Take 10 mg by mouth 3 (three) times daily as needed for muscle spasms. ?  ?cycloSPORINE 0.05 %  ophthalmic emulsion ?Commonly known as: RESTASIS ?Place 1 drop into both eyes 2 (two) times daily as needed (dry eyes). ?  ?doxycycline 100 MG tablet ?Commonly known as: VIBRA-TABS ?Take 1 tablet (100 mg total) by

## 2021-12-14 NOTE — Progress Notes (Signed)
? ?Office Visit Note ?  ?Patient: Marilyn [name]           ?Date of Birth: 22-Jan-1971           ?MRN: [id number] ?Visit Date: 12/14/2021 ?             ?Requested by: Rebecka Apley, NP ?[address] ?[address] ?[place],  [place] [address] ?PCP: Rebecka Apley, NP ? ?Chief Complaint  ?Patient presents with  ? Left Shoulder - Routine Post Op  ?  12/06/21 left shoulder debridement Kerecis graft and micro powder   ? ? ? ? ?HPI: ?Patient presents in follow-up status post irrigation debridement and closure of the left shoulder wound with application of Kerecis 10 x 7 cm graft and 38 cm? micro powder.  Patient had some resolving hematoma drainage at her last visit. ? ?Assessment & Plan: ?Visit Diagnoses:  ?1. Wound of left shoulder, subsequent encounter   ? ? ?Plan: Patient's wound shows good improvement there is healthy granulation tissue at the base there is serosanguineous drainage at this time no further resolving hematoma.  Continue with her antibiotics continue to pack the wound open and wash with soap and water. ? ?She will follow-up with Dr. August Saucer tomorrow and I will follow-up on Monday. ? ?Follow-Up Instructions: Return in about 1 week (around 12/21/2021).  ? ?Ortho Exam ? ?Patient is alert, oriented, no adenopathy, well-dressed, normal affect, normal respiratory effort. ?Examination the wound bed looks much better there is healthy granulation tissue the wound is about a centimeter diameter a centimeter deep the remainder of the incision is well approximated without wound dehiscence or cellulitis. ? ?Imaging: ?No results found. ? ? ? ?Labs: ?Lab Results  ?Component Value Date  ? HGBA1C 6.2 (H) 07/18/2016  ? HGBA1C 6.2 (H) 09/02/2015  ? ESRSEDRATE 70 (H) 12/04/2021  ? ESRSEDRATE 56 (H) 01/08/2019  ? ESRSEDRATE CANCELED 09/18/2018  ? CRP 1.8 (H) 12/08/2021  ? CRP 2.5 (H) 12/04/2021  ? CRP 30.7 (H) 01/08/2019  ? LABURIC 5.9 01/08/2019  ? LABURIC 8.7 (H) 06/16/2018  ? REPTSTATUS 12/06/2021 FINAL  12/01/2021  ? GRAMSTAIN  12/01/2021  ?  FEW WBC PRESENT,BOTH PMN AND MONONUCLEAR ?NO ORGANISMS SEEN ?  ? CULT  12/01/2021  ?  No growth aerobically or anaerobically. ?Performed at [facility], [address], [place], [place] [address] ?  ? LABORGA PSEUDOMONAS AERUGINOSA 06/11/2018  ? ? ? ?Lab Results  ?Component Value Date  ? ALBUMIN 3.9 06/17/2019  ? ALBUMIN 4.1 05/20/2019  ? ALBUMIN 3.3 (L) 07/10/2018  ? ? ?Lab Results  ?Component Value Date  ? MG 1.7 05/20/2019  ? ?No results found for: VD25OH ? ?No results found for: PREALBUMIN ? ?  Latest Ref Rng & Units 12/03/2021  ?  1:44 AM 12/02/2021  ?  2:40 AM 11/18/2021  ?  1:39 AM  ?CBC EXTENDED  ?WBC 4.0 - 10.5 K/uL 9.1   7.3   7.8    ?RBC 3.87 - 5.11 MIL/uL 2.86   3.12   3.59    ?Hemoglobin 12.0 - 15.0 g/dL 8.2   8.7   16.0    ?HCT 36.0 - 46.0 % 25.9   28.1   33.0    ?Platelets 150 - 400 K/uL 315   326   231    ? ? ? ?There is no height or weight on file to calculate BMI. ? ?Orders:  ?No orders of the defined types were placed in this encounter. ? ?  No orders of the defined types were placed in this encounter. ? ? ? Procedures: ?No procedures performed ? ?Clinical Data: ?No additional findings. ? ?ROS: ? ?All other systems negative, except as noted in the HPI. ?Review of Systems ? ?Objective: ?Vital Signs: LMP  (LMP Unknown) Comment: tubal ligation ? ?Specialty Comments:  ?No specialty comments available. ? ?PMFS History: ?Patient Active Problem List  ? Diagnosis Date Noted  ? Arthritis of left shoulder region   ? 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 Unilateral primary osteoarthritis, right hip 05/09/2021  ? S/P lumbar spinal fusion 05/09/2021  ? S/P insertion of spinal cord stimulator 05/09/2021  ? H/O total knee replacement, bilateral 01/18/2021  ? Lumbar post-laminectomy syndrome 04/06/2020  ? Long-term current use of opiate analgesic 11/04/2019  ? Leg pain, right 10/20/2019  ? Dysesthesia 10/20/2019  ? Lateral femoral cutaneous  neuropathy, right 10/20/2019  ? Other spondylosis with radiculopathy, lumbar region   ? Status post lumbar laminectomy 06/23/2019  ? C. difficile colitis 05/22/2019  ? Hypophosphatemia 05/21/2019  ? Syncope 05/20/2019  ? OSA on CPAP   ? Acute gastroenteritis   ? Anemia due to blood loss, acute 06/18/2018  ?  Class: Acute  ? Plantar fasciitis of left foot 06/16/2018  ?  Class: Chronic  ? Tendonitis, Achilles, right 06/16/2018  ?  Class: Chronic  ? Osteochondral talar dome lesion 06/16/2018  ?  Class: Chronic  ? Deep incisional surgical site infection   ? Superficial incisional surgical site infection 06/11/2018  ?  Class: Acute  ? Right ankle effusion 06/11/2018  ?  Class: Acute  ? Morbid (severe) obesity due to excess calories (HCC) 05/29/2018  ?  Class: Chronic  ? Spondylolisthesis, lumbar region 05/27/2018  ?  Class: Chronic  ? Spinal stenosis of lumbar region 05/27/2018  ?  Class: Chronic  ? Urinary tract infection 05/27/2018  ?  Class: Acute  ? Fusion of spine of lumbar region 05/27/2018  ? Pain of right hip joint 07/03/2017  ? Acute right-sided low back pain with right-sided sciatica 03/27/2017  ? Chronic right shoulder pain 02/13/2017  ? History of rotator cuff tear 11/30/2016  ? Impingement syndrome of right shoulder 11/30/2016  ? Exacerbation of asthma 07/18/2016  ? Hypokalemia 07/18/2016  ? Hypertension 07/18/2016  ? Depression with anxiety 07/18/2016  ? Hypothyroidism 07/18/2016  ? Hyperglycemia 07/18/2016  ? Acute asthma exacerbation 07/18/2016  ? Surgical wound dehiscence left hip; questionable superficial infection 11/10/2015  ? Surgical site infection 11/10/2015  ? Osteoarthritis of left hip 09/09/2015  ? Status post total replacement of left hip 09/09/2015  ? Obesity (BMI 35.0-39.9 without comorbidity) 04/28/2013  ? ?Past Medical History:  ?Diagnosis Date  ? Anemia   ? taking iron now. pt states having no current issues 09/02/2015  ? Anginal pain (HCC)   ? pt states experiences chest wall pain pt  states related to her asthma   ? Anxiety   ? with MRI's  ? Arthritis   ? Everywhere  ? Asthma   ? Depression   ? Dizziness   ? GERD (gastroesophageal reflux disease)   ? Headache(784.0)   ? HX OF MIGRAINES  ? History of bronchitis   ? Hypertension   ? Hypothyroidism   ? takes levothyroxen  ? OSA on CPAP   ? wears cpap  ? Shortness of breath   ? with exertion  ? Wears glasses   ?  ?Family History  ?Problem Relation Age of Onset  ?  Cancer Mother   ?     colon  ? Epilepsy Mother   ? Cancer Father   ?     prostate  ? Diabetes Father   ? Hypertension Father   ? Hypertension Maternal Aunt   ? Diabetes Maternal Aunt   ? Hypertension Paternal Aunt   ?  ?Past Surgical History:  ?Procedure Laterality Date  ? APPLICATION OF WOUND VAC  11/16/2021  ? Procedure: APPLICATION OF WOUND VAC;  Surgeon: Cammy Copa, MD;  Location: [facility] OR;  Service: Orthopedics;;  ? APPLICATION OF WOUND VAC Left 12/01/2021  ? Procedure: APPLICATION OF WOUND VAC;  Surgeon: Cammy Copa, MD;  Location: [facility] OR;  Service: Orthopedics;  Laterality: Left;  ? BACK SURGERY    ? CESAREAN SECTION    ? times 2  ? CHOLECYSTECTOMY    ? COLONOSCOPY  2020  ? ENDOMETRIAL ABLATION    ? I & D EXTREMITY Left 12/06/2021  ? Procedure: LEFT SHOULDER DEBRIDEMENT AND WOUND CLOSURE;  Surgeon: [name], MD;  Location: [facility] OR;  Service: Orthopedics;  Laterality: Left;  ? INCISION AND DRAINAGE Left 12/01/2021  ? Procedure: LEFT SHOULDER IRRIGATION AND EXCISIONAL DEBRIDEMENT;  Surgeon: Cammy Copa, MD;  Location: [facility] OR;  Service: Orthopedics;  Laterality: Left;  ? INCISION AND DRAINAGE HIP Left 11/10/2015  ? Procedure: IRRIGATION AND DEBRIDEMENT LEFT HIP INCISION;  Surgeon: Kathryne Hitch, MD;  Location: [facility] OR;  Service: Orthopedics;  Laterality: Left;  ? JOINT REPLACEMENT  2011  ? total left knee  ? KNEE ARTHROPLASTY  04/23/2012  ? right   ? KNEE ARTHROSCOPY    ? LUMBAR LAMINECTOMY/DECOMPRESSION MICRODISCECTOMY N/A 06/23/2019  ? Procedure: RIGHT LUMBAR  FIVE THROUGH SACRAL ONE PARTIAL HEMILAMINECTOMY WITH RIGHT LUMBAR FIVE FORAMINOTOMY;  Surgeon: Kerrin Champagne, MD;  Location: [facility] OR;  Service: Orthopedics;  Laterality: N/A;  ? LUMBAR WOUND DEBRIDEMENT N/A 06/11/2018  ? Pr

## 2021-12-14 NOTE — Progress Notes (Signed)
? ?Post-Op Visit Note ?  ?Patient: Marilyn [name]           ?Date of Birth: 1970-10-04           ?MRN: [id number] ?Visit Date: 12/11/2021 ?PCP: Rebecka ApleyHemberg, Katherine V, NP ? ? ?Assessment & Plan: ? ?Chief Complaint: No chief complaint on file. ? ?Visit Diagnoses:  ?1. Status post reverse arthroplasty of left shoulder   ? ? ?Plan: Patient is a 51 year old female who who presents s/p left reverse shoulder arthroplasty with subsequent irrigation and debridement of superficial infection by Dr. August Saucerean on 12/01/2021 and repeat I&D with wound closure by Dr. Lajoyce Cornersuda on 12/06/2021.  She was discharged from the hospital on Friday.  There was a malfunction with the wound VAC where the wound VAC hose was clogged up with a large clot of blood that was attempted to be freed with spinal needle and saline irrigation but this was not successful.  Wound VAC had to be removed and replaced with Aquacel dressing.  She has had bloody drainage over the weekend that has saturated the inferior portion of her Aquacel dressing.  She denies any fevers, chills, night sweats, any other symptoms aside from shoulder pain.  The Aquacel dressing was removed to show loosely closed incision that looks to be healing well proximally but does have some continued drainage of hematoma that is expressible through the inferior portion of the incision.  This was reviewed by Dr. August Saucerean and Dr. Lajoyce Cornersuda in the office today.  Decision was made to remove 1 suture around the area of drainage and packed this with iodoform gauze.  Patient tolerated this fairly well.  She will leave this packing in until Wednesday when this will be removed and repacked with the wound examined.  She will continue with antibiotics that were prescribed by infectious disease with follow-up with them in the near future. ? ? ? ? ? ? ? ?Follow-Up Instructions: No follow-ups on file.  ? ?Orders:  ?No orders of the defined types were placed in this encounter. ? ?No orders of the defined types were placed  in this encounter. ? ? ?Imaging: ?No results found. ? ?PMFS History: ?Patient Active Problem List  ? Diagnosis Date Noted  ? Arthritis of left shoulder region   ? 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 Unilateral primary osteoarthritis, right hip 05/09/2021  ? S/P lumbar spinal fusion 05/09/2021  ? S/P insertion of spinal cord stimulator 05/09/2021  ? H/O total knee replacement, bilateral 01/18/2021  ? Lumbar post-laminectomy syndrome 04/06/2020  ? Long-term current use of opiate analgesic 11/04/2019  ? Leg pain, right 10/20/2019  ? Dysesthesia 10/20/2019  ? 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 Other spondylosis with radiculopathy, lumbar region   ? Status post lumbar laminectomy 06/23/2019  ? C. difficile colitis 05/22/2019  ? Hypophosphatemia 05/21/2019  ? Syncope 05/20/2019  ? OSA on CPAP   ? Acute gastroenteritis   ? Anemia due to blood loss, acute 06/18/2018  ?  Class: Acute  ? Plantar fasciitis of left foot 06/16/2018  ?  Class: Chronic  ? Tendonitis, Achilles, right 06/16/2018  ?  Class: Chronic  ? Osteochondral talar dome lesion 06/16/2018  ?  Class: Chronic  ? Deep incisional surgical site infection   ? Superficial incisional surgical site infection 06/11/2018  ?  Class: Acute  ? Right ankle effusion 06/11/2018  ?  Class: Acute  ? Morbid (severe) obesity due to excess calories (HCC) 05/29/2018  ?  Class:  Chronic  ? Spondylolisthesis, lumbar region 05/27/2018  ?  Class: Chronic  ? Spinal stenosis of lumbar region 05/27/2018  ?  Class: Chronic  ? Urinary tract infection 05/27/2018  ?  Class: Acute  ? Fusion of spine of lumbar region 05/27/2018  ? Pain of right hip joint 07/03/2017  ? Acute right-sided low back pain with right-sided sciatica 03/27/2017  ? Chronic right shoulder pain 02/13/2017  ? History of rotator cuff tear 11/30/2016  ? Impingement syndrome of right shoulder 11/30/2016  ? Exacerbation of asthma 07/18/2016  ? Hypokalemia 07/18/2016  ? Hypertension  07/18/2016  ? Depression with anxiety 07/18/2016  ? Hypothyroidism 07/18/2016  ? Hyperglycemia 07/18/2016  ? Acute asthma exacerbation 07/18/2016  ? Surgical wound dehiscence left hip; questionable superficial infection 11/10/2015  ? Surgical site infection 11/10/2015  ? Osteoarthritis of left hip 09/09/2015  ? Status post total replacement of left hip 09/09/2015  ? Obesity (BMI 35.0-39.9 without comorbidity) 04/28/2013  ? ?Past Medical History:  ?Diagnosis Date  ? Anemia   ? taking iron now. pt states having no current issues 09/02/2015  ? Anginal pain (HCC)   ? pt states experiences chest wall pain pt states related to her asthma   ? Anxiety   ? with MRI's  ? Arthritis   ? Everywhere  ? Asthma   ? Depression   ? Dizziness   ? GERD (gastroesophageal reflux disease)   ? Headache(784.0)   ? HX OF MIGRAINES  ? History of bronchitis   ? Hypertension   ? Hypothyroidism   ? takes levothyroxen  ? OSA on CPAP   ? wears cpap  ? Shortness of breath   ? with exertion  ? Wears glasses   ?  ?Family History  ?Problem Relation Age of Onset  ? Cancer Mother   ?     colon  ? Epilepsy Mother   ? Cancer Father   ?     prostate  ? Diabetes Father   ? Hypertension Father   ? Hypertension Maternal Aunt   ? Diabetes Maternal Aunt   ? Hypertension Paternal Aunt   ?  ?Past Surgical History:  ?Procedure Laterality Date  ? APPLICATION OF WOUND VAC  11/16/2021  ? Procedure: APPLICATION OF WOUND VAC;  Surgeon: Cammy Copa, MD;  Location: [facility] OR;  Service: Orthopedics;;  ? APPLICATION OF WOUND VAC Left 12/01/2021  ? Procedure: APPLICATION OF WOUND VAC;  Surgeon: Cammy Copa, MD;  Location: [facility] OR;  Service: Orthopedics;  Laterality: Left;  ? BACK SURGERY    ? CESAREAN SECTION    ? times 2  ? CHOLECYSTECTOMY    ? COLONOSCOPY  2020  ? ENDOMETRIAL ABLATION    ? I & D EXTREMITY Left 12/06/2021  ? Procedure: LEFT SHOULDER DEBRIDEMENT AND WOUND CLOSURE;  Surgeon: [name], MD;  Location: [facility] OR;  Service: Orthopedics;  Laterality:  Left;  ? INCISION AND DRAINAGE Left 12/01/2021  ? Procedure: LEFT SHOULDER IRRIGATION AND EXCISIONAL DEBRIDEMENT;  Surgeon: Cammy Copa, MD;  Location: [facility] OR;  Service: Orthopedics;  Laterality: Left;  ? INCISION AND DRAINAGE HIP Left 11/10/2015  ? Procedure: IRRIGATION AND DEBRIDEMENT LEFT HIP INCISION;  Surgeon: Kathryne Hitch, MD;  Location: [facility] OR;  Service: Orthopedics;  Laterality: Left;  ? JOINT REPLACEMENT  2011  ? total left knee  ? KNEE ARTHROPLASTY  04/23/2012  ? right   ? KNEE ARTHROSCOPY    ? LUMBAR LAMINECTOMY/DECOMPRESSION MICRODISCECTOMY N/A 06/23/2019  ? Procedure:  RIGHT LUMBAR FIVE THROUGH SACRAL ONE PARTIAL HEMILAMINECTOMY WITH RIGHT LUMBAR FIVE FORAMINOTOMY;  Surgeon: Kerrin Champagne, MD;  Location: [facility] OR;  Service: Orthopedics;  Laterality: N/A;  ? LUMBAR WOUND DEBRIDEMENT N/A 06/11/2018  ? Procedure: LUMBAR WOUND DEBRIDEMENT;  Surgeon: Kerrin Champagne, MD;  Location: [facility] OR;  Service: Orthopedics;  Laterality: N/A;  ? RADIOLOGY WITH ANESTHESIA N/A 09/09/2018  ? Procedure: LUMBER SPINE WITHOUT CONTRAST;  Surgeon: Radiologist, Medication, MD;  Location: [facility] OR;  Service: Radiology;  Laterality: N/A;  ? REVERSE SHOULDER ARTHROPLASTY Left 11/16/2021  ? Procedure: LEFT REVERSE SHOULDER ARTHROPLASTY;  Surgeon: Cammy Copa, MD;  Location: [facility] OR;  Service: Orthopedics;  Laterality: Left;  ? ROTATOR CUFF REPAIR    ? left   ? SHOULDER SURGERY    ? right to repair ligament tear  ? TOTAL HIP ARTHROPLASTY Left 09/09/2015  ? Procedure: LEFT TOTAL HIP ARTHROPLASTY ANTERIOR APPROACH;  Surgeon: Kathryne Hitch, MD;  Location: [facility] ORS;  Service: Orthopedics;  Laterality: Left;  ? TOTAL KNEE ARTHROPLASTY  04/23/2012  ? Procedure: TOTAL KNEE ARTHROPLASTY;  Surgeon: [name], MD;  Location: [facility] OR;  Service: Orthopedics;  Laterality: Right;  Right Total Knee Arthroplasty  ? TUBAL LIGATION  1996  ? ?Social History  ? ?Occupational History  ? Not on file  ?Tobacco Use  ? Smoking status:  Former  ?  Packs/day: 0.50  ?  Years: 4.00  ?  Pack years: 2.00  ?  Types: Cigarettes  ?  Quit date: 09/18/1987  ?  Years since quitting: 34.2  ? Smokeless tobacco: Never  ?Vaping Use  ? Vaping Use: Never used  ?Su

## 2021-12-14 NOTE — Progress Notes (Signed)
? ?Post-Op Visit Note ?  ?Patient: Marilyn [name]           ?Date of Birth: 1971/03/24           ?MRN: [id number] ?Visit Date: 12/13/2021 ?PCP: Rebecka ApleyHemberg, Katherine V, NP ? ? ?Assessment & Plan: ? ?Chief Complaint:  ?Chief Complaint  ?Patient presents with  ? Left Shoulder - Routine Post Op  ? ?Visit Diagnoses:  ?1. Status post reverse arthroplasty of left shoulder   ? ? ?Plan: Patient is a 51 year old female who who presents s/p left reverse shoulder arthroplasty with subsequent irrigation and debridement of superficial infection by Dr. August Saucerean on 12/01/2021 and repeat I&D with wound closure by Dr. Lajoyce Cornersuda on 12/06/2021.  She was seen on Monday with increased drainage from her incision.  She had some problems with the Prevena wound VAC in the hospital and it had to be removed.  She had her wound packed on Monday.  Iodoform gauze removed today and Aquacel dressing removed.  There was a moderate amount of sanguinous drainage that drained out along with the packing material.  Not as much expressible bloody drainage as there was on Monday.  She continues to deny any fevers, chills, night sweats.  She just complains of shoulder pain.  The wound was repacked today and redressed with an Aquacel bandage.  She will follow-up with Dr. Lajoyce Cornersuda tomorrow morning for his evaluation.  She also missed her infectious disease appointment which she thought was on Friday but was actually today.  Number was provided for her to call the infectious disease office to reschedule appointment.  Follow-up on Friday again for repacking. ? ?Follow-Up Instructions: No follow-ups on file.  ? ?Orders:  ?No orders of the defined types were placed in this encounter. ? ?No orders of the defined types were placed in this encounter. ? ? ?Imaging: ?No results found. ? ?PMFS History: ?Patient Active Problem List  ? Diagnosis Date Noted  ? Arthritis of left shoulder region   ? 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 Unilateral primary  osteoarthritis, right hip 05/09/2021  ? S/P lumbar spinal fusion 05/09/2021  ? S/P insertion of spinal cord stimulator 05/09/2021  ? H/O total knee replacement, bilateral 01/18/2021  ? Lumbar post-laminectomy syndrome 04/06/2020  ? Long-term current use of opiate analgesic 11/04/2019  ? Leg pain, right 10/20/2019  ? Dysesthesia 10/20/2019  ? 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 Other spondylosis with radiculopathy, lumbar region   ? Status post lumbar laminectomy 06/23/2019  ? C. difficile colitis 05/22/2019  ? Hypophosphatemia 05/21/2019  ? Syncope 05/20/2019  ? OSA on CPAP   ? Acute gastroenteritis   ? Anemia due to blood loss, acute 06/18/2018  ?  Class: Acute  ? Plantar fasciitis of left foot 06/16/2018  ?  Class: Chronic  ? Tendonitis, Achilles, right 06/16/2018  ?  Class: Chronic  ? Osteochondral talar dome lesion 06/16/2018  ?  Class: Chronic  ? Deep incisional surgical site infection   ? Superficial incisional surgical site infection 06/11/2018  ?  Class: Acute  ? Right ankle effusion 06/11/2018  ?  Class: Acute  ? Morbid (severe) obesity due to excess calories (HCC) 05/29/2018  ?  Class: Chronic  ? Spondylolisthesis, lumbar region 05/27/2018  ?  Class: Chronic  ? Spinal stenosis of lumbar region 05/27/2018  ?  Class: Chronic  ? Urinary tract infection 05/27/2018  ?  Class: Acute  ? Fusion of spine of lumbar region 05/27/2018  ?  Pain of right hip joint 07/03/2017  ? Acute right-sided low back pain with right-sided sciatica 03/27/2017  ? Chronic right shoulder pain 02/13/2017  ? History of rotator cuff tear 11/30/2016  ? Impingement syndrome of right shoulder 11/30/2016  ? Exacerbation of asthma 07/18/2016  ? Hypokalemia 07/18/2016  ? Hypertension 07/18/2016  ? Depression with anxiety 07/18/2016  ? Hypothyroidism 07/18/2016  ? Hyperglycemia 07/18/2016  ? Acute asthma exacerbation 07/18/2016  ? Surgical wound dehiscence left hip; questionable superficial infection 11/10/2015  ? Surgical  site infection 11/10/2015  ? Osteoarthritis of left hip 09/09/2015  ? Status post total replacement of left hip 09/09/2015  ? Obesity (BMI 35.0-39.9 without comorbidity) 04/28/2013  ? ?Past Medical History:  ?Diagnosis Date  ? Anemia   ? taking iron now. pt states having no current issues 09/02/2015  ? Anginal pain (HCC)   ? pt states experiences chest wall pain pt states related to her asthma   ? Anxiety   ? with MRI's  ? Arthritis   ? Everywhere  ? Asthma   ? Depression   ? Dizziness   ? GERD (gastroesophageal reflux disease)   ? Headache(784.0)   ? HX OF MIGRAINES  ? History of bronchitis   ? Hypertension   ? Hypothyroidism   ? takes levothyroxen  ? OSA on CPAP   ? wears cpap  ? Shortness of breath   ? with exertion  ? Wears glasses   ?  ?Family History  ?Problem Relation Age of Onset  ? Cancer Mother   ?     colon  ? Epilepsy Mother   ? Cancer Father   ?     prostate  ? Diabetes Father   ? Hypertension Father   ? Hypertension Maternal Aunt   ? Diabetes Maternal Aunt   ? Hypertension Paternal Aunt   ?  ?Past Surgical History:  ?Procedure Laterality Date  ? APPLICATION OF WOUND VAC  11/16/2021  ? Procedure: APPLICATION OF WOUND VAC;  Surgeon: Cammy Copa, MD;  Location: [facility] OR;  Service: Orthopedics;;  ? APPLICATION OF WOUND VAC Left 12/01/2021  ? Procedure: APPLICATION OF WOUND VAC;  Surgeon: Cammy Copa, MD;  Location: [facility] OR;  Service: Orthopedics;  Laterality: Left;  ? BACK SURGERY    ? CESAREAN SECTION    ? times 2  ? CHOLECYSTECTOMY    ? COLONOSCOPY  2020  ? ENDOMETRIAL ABLATION    ? I & D EXTREMITY Left 12/06/2021  ? Procedure: LEFT SHOULDER DEBRIDEMENT AND WOUND CLOSURE;  Surgeon: [name], MD;  Location: [facility] OR;  Service: Orthopedics;  Laterality: Left;  ? INCISION AND DRAINAGE Left 12/01/2021  ? Procedure: LEFT SHOULDER IRRIGATION AND EXCISIONAL DEBRIDEMENT;  Surgeon: Cammy Copa, MD;  Location: [facility] OR;  Service: Orthopedics;  Laterality: Left;  ? INCISION AND DRAINAGE HIP Left  11/10/2015  ? Procedure: IRRIGATION AND DEBRIDEMENT LEFT HIP INCISION;  Surgeon: Kathryne Hitch, MD;  Location: [facility] OR;  Service: Orthopedics;  Laterality: Left;  ? JOINT REPLACEMENT  2011  ? total left knee  ? KNEE ARTHROPLASTY  04/23/2012  ? right   ? KNEE ARTHROSCOPY    ? LUMBAR LAMINECTOMY/DECOMPRESSION MICRODISCECTOMY N/A 06/23/2019  ? Procedure: RIGHT LUMBAR FIVE THROUGH SACRAL ONE PARTIAL HEMILAMINECTOMY WITH RIGHT LUMBAR FIVE FORAMINOTOMY;  Surgeon: Kerrin Champagne, MD;  Location: [facility] OR;  Service: Orthopedics;  Laterality: N/A;  ? LUMBAR WOUND DEBRIDEMENT N/A 06/11/2018  ? Procedure: LUMBAR WOUND DEBRIDEMENT;  Surgeon: Kerrin Champagne,  MD;  Location: [facility] OR;  Service: Orthopedics;  Laterality: N/A;  ? RADIOLOGY WITH ANESTHESIA N/A 09/09/2018  ? Procedure: LUMBER SPINE WITHOUT CONTRAST;  Surgeon: Radiologist, Medication, MD;  Location: [facility] OR;  Service: Radiology;  Laterality: N/A;  ? REVERSE SHOULDER ARTHROPLASTY Left 11/16/2021  ? Procedure: LEFT REVERSE SHOULDER ARTHROPLASTY;  Surgeon: Cammy Copa, MD;  Location: [facility] OR;  Service: Orthopedics;  Laterality: Left;  ? ROTATOR CUFF REPAIR    ? left   ? SHOULDER SURGERY    ? right to repair ligament tear  ? TOTAL HIP ARTHROPLASTY Left 09/09/2015  ? Procedure: LEFT TOTAL HIP ARTHROPLASTY ANTERIOR APPROACH;  Surgeon: Kathryne Hitch, MD;  Location: [facility] ORS;  Service: Orthopedics;  Laterality: Left;  ? TOTAL KNEE ARTHROPLASTY  04/23/2012  ? Procedure: TOTAL KNEE ARTHROPLASTY;  Surgeon: [name], MD;  Location: [facility] OR;  Service: Orthopedics;  Laterality: Right;  Right Total Knee Arthroplasty  ? TUBAL LIGATION  1996  ? ?Social History  ? ?Occupational History  ? Not on file  ?Tobacco Use  ? Smoking status: Former  ?  Packs/day: 0.50  ?  Years: 4.00  ?  Pack years: 2.00  ?  Types: Cigarettes  ?  Quit date: 09/18/1987  ?  Years since quitting: 34.2  ? Smokeless tobacco: Never  ?Vaping Use  ? Vaping Use: Never used  ?Substance and Sexual Activity  ?  Alcohol use: No  ? Drug use: No  ? Sexual activity: Yes  ?  Birth control/protection: Surgical  ? ? ? ?

## 2021-12-15 ENCOUNTER — Encounter: Payer: Self-pay | Admitting: Surgical

## 2021-12-15 ENCOUNTER — Ambulatory Visit (INDEPENDENT_AMBULATORY_CARE_PROVIDER_SITE_OTHER): Payer: Medicare Other | Admitting: Surgical

## 2021-12-15 VITALS — BP 166/75 | HR 84

## 2021-12-15 DIAGNOSIS — Z96612 Presence of left artificial shoulder joint: Secondary | ICD-10-CM

## 2021-12-15 DIAGNOSIS — S41002D Unspecified open wound of left shoulder, subsequent encounter: Secondary | ICD-10-CM

## 2021-12-16 ENCOUNTER — Encounter: Payer: Self-pay | Admitting: Surgical

## 2021-12-16 NOTE — Progress Notes (Signed)
? ?Post-Op Visit Note ?  ?Patient: Marilyn [name]           ?Date of Birth: 1971-03-30           ?MRN: [id number] ?Visit Date: 12/15/2021 ?PCP: Bridget Hartshorn, NP ? ? ?Assessment & Plan: ? ?Chief Complaint:  ?Chief Complaint  ?Patient presents with  ? Left Shoulder - Routine Post Op  ? ?Visit Diagnoses:  ?1. Wound of left shoulder, subsequent encounter   ?2. Status post reverse arthroplasty of left shoulder   ? ? ?Plan: Patient is a 51 year old female who who presents s/p left reverse shoulder arthroplasty with subsequent irrigation and debridement of superficial infection by Dr. Marlou Sa on 12/01/2021 and repeat I&D with wound closure by Dr. Sharol Given on 12/06/2021.  She was seen by Dr. Sharol Given yesterday and he felt that the wound was improving with healthy granulation tissue present.  Today, she continues to deny any fevers, chills, night sweats, chest pain.  No complaints aside from shoulder pain.  The wound was evaluated and packing material removed with new iodoform gauze packed into the wound and covered with an Aquacel dressing.  There was some nonpurulent, serosanguineous fluid that was expressed but again this was less than the previous packing visit. ? ?Additionally, Marilyn [name]'s daughter did raise concern over some increased dyspnea that patient has been dealing with.  Jesselynn does not feel like this is really a significant issue for her.  She looks well on exam with no cyanosis or pallor.  She denies any dizziness or lightheadedness with ambulation.  She does have some wheezing in the office today.  She denies any pulmonary history aside from asthma.  Pulse ox was checked and patient was satting at 99% while supine.  She walked up and down the hallway with rechecking of her oxygen saturation immediately after this exercise which showed a pulse ox of 98%.  No urgent intervention needed at this time but recommended patient report to the emergency department or urgent care if she has worsened shortness of breath over  the weekend.   ? ?Follow-up on Monday with Dr. Sharol Given and likely with Dr. Marlou Sa on Wednesday. ? ?Follow-Up Instructions: No follow-ups on file.  ? ?Orders:  ?No orders of the defined types were placed in this encounter. ? ?No orders of the defined types were placed in this encounter. ? ? ?Imaging: ?No results found. ? ?PMFS History: ?Patient Active Problem List  ? Diagnosis Date Noted  ? Arthritis of left shoulder region   ? 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 Unilateral primary osteoarthritis, right hip 05/09/2021  ? S/P lumbar spinal fusion 05/09/2021  ? S/P insertion of spinal cord stimulator 05/09/2021  ? H/O total knee replacement, bilateral 01/18/2021  ? Lumbar post-laminectomy syndrome 04/06/2020  ? Long-term current use of opiate analgesic 11/04/2019  ? Leg pain, right 10/20/2019  ? Dysesthesia 10/20/2019  ? 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 Other spondylosis with radiculopathy, lumbar region   ? Status post lumbar laminectomy 06/23/2019  ? C. difficile colitis 05/22/2019  ? Hypophosphatemia 05/21/2019  ? Syncope 05/20/2019  ? OSA on CPAP   ? Acute gastroenteritis   ? Anemia due to blood loss, acute 06/18/2018  ?  Class: Acute  ? Plantar fasciitis of left foot 06/16/2018  ?  Class: Chronic  ? Tendonitis, Achilles, right 06/16/2018  ?  Class: Chronic  ? Osteochondral talar dome lesion 06/16/2018  ?  Class: Chronic  ? Deep incisional surgical site  infection   ? Superficial incisional surgical site infection 06/11/2018  ?  Class: Acute  ? Right ankle effusion 06/11/2018  ?  Class: Acute  ? Morbid (severe) obesity due to excess calories ([place]) 05/29/2018  ?  Class: Chronic  ? Spondylolisthesis, lumbar region 05/27/2018  ?  Class: Chronic  ? Spinal stenosis of lumbar region 05/27/2018  ?  Class: Chronic  ? Urinary tract infection 05/27/2018  ?  Class: Acute  ? Fusion of spine of lumbar region 05/27/2018  ? Pain of right hip joint 07/03/2017  ? Acute right-sided low back  pain with right-sided sciatica 03/27/2017  ? Chronic right shoulder pain 02/13/2017  ? History of rotator cuff tear 11/30/2016  ? Impingement syndrome of right shoulder 11/30/2016  ? Exacerbation of asthma 07/18/2016  ? Hypokalemia 07/18/2016  ? Hypertension 07/18/2016  ? Depression with anxiety 07/18/2016  ? Hypothyroidism 07/18/2016  ? Hyperglycemia 07/18/2016  ? Acute asthma exacerbation 07/18/2016  ? Surgical wound dehiscence left hip; questionable superficial infection 11/10/2015  ? Surgical site infection 11/10/2015  ? Osteoarthritis of left hip 09/09/2015  ? Status post total replacement of left hip 09/09/2015  ? Obesity (BMI 35.0-39.9 without comorbidity) 04/28/2013  ? ?Past Medical History:  ?Diagnosis Date  ? Anemia   ? taking iron now. pt states having no current issues 09/02/2015  ? Anginal pain ([place])   ? pt states experiences chest wall pain pt states related to her asthma   ? Anxiety   ? with MRI's  ? Arthritis   ? Everywhere  ? Asthma   ? Depression   ? Dizziness   ? GERD (gastroesophageal reflux disease)   ? Headache(784.0)   ? HX OF MIGRAINES  ? History of bronchitis   ? Hypertension   ? Hypothyroidism   ? takes levothyroxen  ? OSA on CPAP   ? wears cpap  ? Shortness of breath   ? with exertion  ? Wears glasses   ?  ?Family History  ?Problem Relation Age of Onset  ? Cancer Mother   ?     colon  ? Epilepsy Mother   ? Cancer Father   ?     prostate  ? Diabetes Father   ? Hypertension Father   ? Hypertension Maternal Aunt   ? Diabetes Maternal Aunt   ? Hypertension Paternal Aunt   ?  ?Past Surgical History:  ?Procedure Laterality Date  ? APPLICATION OF WOUND VAC  11/16/2021  ? Procedure: APPLICATION OF WOUND VAC;  Surgeon: Meredith Pel, MD;  Location: [place];  Service: Orthopedics;;  ? APPLICATION OF WOUND VAC Left 12/01/2021  ? Procedure: APPLICATION OF WOUND VAC;  Surgeon: Meredith Pel, MD;  Location: [place];  Service: Orthopedics;  Laterality: Left;  ? BACK SURGERY    ? CESAREAN SECTION     ? times 2  ? CHOLECYSTECTOMY    ? COLONOSCOPY  2020  ? ENDOMETRIAL ABLATION    ? I & D EXTREMITY Left 12/06/2021  ? Procedure: LEFT SHOULDER DEBRIDEMENT AND WOUND CLOSURE;  Surgeon: Newt Minion, MD;  Location: [place];  Service: Orthopedics;  Laterality: Left;  ? INCISION AND DRAINAGE Left 12/01/2021  ? Procedure: LEFT SHOULDER IRRIGATION AND EXCISIONAL DEBRIDEMENT;  Surgeon: Meredith Pel, MD;  Location: [place];  Service: Orthopedics;  Laterality: Left;  ? INCISION AND DRAINAGE HIP Left 11/10/2015  ? Procedure: IRRIGATION AND DEBRIDEMENT LEFT HIP INCISION;  Surgeon: Mcarthur Rossetti, MD;  Location: [place];  Service:  Orthopedics;  Laterality: Left;  ? JOINT REPLACEMENT  2011  ? total left knee  ? KNEE ARTHROPLASTY  04/23/2012  ? right   ? KNEE ARTHROSCOPY    ? LUMBAR LAMINECTOMY/DECOMPRESSION MICRODISCECTOMY N/A 06/23/2019  ? Procedure: RIGHT LUMBAR FIVE THROUGH SACRAL ONE PARTIAL HEMILAMINECTOMY WITH RIGHT LUMBAR FIVE FORAMINOTOMY;  Surgeon: Jessy Oto, MD;  Location: [place];  Service: Orthopedics;  Laterality: N/A;  ? LUMBAR WOUND DEBRIDEMENT N/A 06/11/2018  ? Procedure: LUMBAR WOUND DEBRIDEMENT;  Surgeon: Jessy Oto, MD;  Location: [place];  Service: Orthopedics;  Laterality: N/A;  ? RADIOLOGY WITH ANESTHESIA N/A 09/09/2018  ? Procedure: LUMBER SPINE WITHOUT CONTRAST;  Surgeon: Radiologist, Medication, MD;  Location: [place];  Service: Radiology;  Laterality: N/A;  ? REVERSE SHOULDER ARTHROPLASTY Left 11/16/2021  ? Procedure: LEFT REVERSE SHOULDER ARTHROPLASTY;  Surgeon: Meredith Pel, MD;  Location: [place];  Service: Orthopedics;  Laterality: Left;  ? ROTATOR CUFF REPAIR    ? left   ? SHOULDER SURGERY    ? right to repair ligament tear  ? TOTAL HIP ARTHROPLASTY Left 09/09/2015  ? Procedure: LEFT TOTAL HIP ARTHROPLASTY ANTERIOR APPROACH;  Surgeon: Mcarthur Rossetti, MD;  Location: [facility];  Service: Orthopedics;  Laterality: Left;  ? TOTAL KNEE ARTHROPLASTY  04/23/2012  ? Procedure: TOTAL KNEE  ARTHROPLASTY;  Surgeon: Newt Minion, MD;  Location: [place];  Service: Orthopedics;  Laterality: Right;  Right Total Knee Arthroplasty  ? TUBAL LIGATION  1996  ? ?Social History  ? ?Occupational Hist

## 2021-12-17 ENCOUNTER — Encounter (HOSPITAL_COMMUNITY): Payer: Self-pay | Admitting: Emergency Medicine

## 2021-12-17 ENCOUNTER — Telehealth: Payer: Self-pay | Admitting: Surgical

## 2021-12-17 ENCOUNTER — Other Ambulatory Visit: Payer: Self-pay

## 2021-12-17 ENCOUNTER — Emergency Department (HOSPITAL_COMMUNITY): Payer: Medicare Other

## 2021-12-17 ENCOUNTER — Inpatient Hospital Stay (HOSPITAL_COMMUNITY)
Admission: EM | Admit: 2021-12-17 | Discharge: 2021-12-23 | DRG: 857 | Disposition: A | Discharge status: Home / Self Care | Payer: Medicare Other | Attending: Internal Medicine | Admitting: Internal Medicine

## 2021-12-17 DIAGNOSIS — Z96642 Presence of left artificial hip joint: Secondary | ICD-10-CM | POA: Diagnosis present

## 2021-12-17 DIAGNOSIS — Z79899 Other long term (current) drug therapy: Secondary | ICD-10-CM

## 2021-12-17 DIAGNOSIS — F32A Depression, unspecified: Secondary | ICD-10-CM | POA: Diagnosis present

## 2021-12-17 DIAGNOSIS — E039 Hypothyroidism, unspecified: Secondary | ICD-10-CM | POA: Diagnosis present

## 2021-12-17 DIAGNOSIS — Z888 Allergy status to other drugs, medicaments and biological substances status: Secondary | ICD-10-CM

## 2021-12-17 DIAGNOSIS — Z79890 Hormone replacement therapy (postmenopausal): Secondary | ICD-10-CM

## 2021-12-17 DIAGNOSIS — E876 Hypokalemia: Secondary | ICD-10-CM | POA: Diagnosis present

## 2021-12-17 DIAGNOSIS — K219 Gastro-esophageal reflux disease without esophagitis: Secondary | ICD-10-CM | POA: Diagnosis present

## 2021-12-17 DIAGNOSIS — M25512 Pain in left shoulder: Principal | ICD-10-CM

## 2021-12-17 DIAGNOSIS — F419 Anxiety disorder, unspecified: Secondary | ICD-10-CM | POA: Diagnosis present

## 2021-12-17 DIAGNOSIS — Z8249 Family history of ischemic heart disease and other diseases of the circulatory system: Secondary | ICD-10-CM

## 2021-12-17 DIAGNOSIS — Z7982 Long term (current) use of aspirin: Secondary | ICD-10-CM

## 2021-12-17 DIAGNOSIS — Z91030 Bee allergy status: Secondary | ICD-10-CM

## 2021-12-17 DIAGNOSIS — E872 Acidosis, unspecified: Secondary | ICD-10-CM | POA: Diagnosis present

## 2021-12-17 DIAGNOSIS — Z91040 Latex allergy status: Secondary | ICD-10-CM

## 2021-12-17 DIAGNOSIS — D62 Acute posthemorrhagic anemia: Secondary | ICD-10-CM | POA: Diagnosis not present

## 2021-12-17 DIAGNOSIS — Z20822 Contact with and (suspected) exposure to covid-19: Secondary | ICD-10-CM | POA: Diagnosis present

## 2021-12-17 DIAGNOSIS — D649 Anemia, unspecified: Secondary | ICD-10-CM

## 2021-12-17 DIAGNOSIS — Z885 Allergy status to narcotic agent status: Secondary | ICD-10-CM

## 2021-12-17 DIAGNOSIS — G4733 Obstructive sleep apnea (adult) (pediatric): Secondary | ICD-10-CM | POA: Diagnosis present

## 2021-12-17 DIAGNOSIS — T8149XA Infection following a procedure, other surgical site, initial encounter: Principal | ICD-10-CM | POA: Diagnosis present

## 2021-12-17 DIAGNOSIS — Z96651 Presence of right artificial knee joint: Secondary | ICD-10-CM | POA: Diagnosis present

## 2021-12-17 DIAGNOSIS — Z91018 Allergy to other foods: Secondary | ICD-10-CM

## 2021-12-17 DIAGNOSIS — M109 Gout, unspecified: Secondary | ICD-10-CM | POA: Diagnosis present

## 2021-12-17 DIAGNOSIS — Y831 Surgical operation with implant of artificial internal device as the cause of abnormal reaction of the patient, or of later complication, without mention of misadventure at the time of the procedure: Secondary | ICD-10-CM | POA: Diagnosis present

## 2021-12-17 DIAGNOSIS — Z886 Allergy status to analgesic agent status: Secondary | ICD-10-CM

## 2021-12-17 DIAGNOSIS — L02414 Cutaneous abscess of left upper limb: Secondary | ICD-10-CM | POA: Diagnosis present

## 2021-12-17 DIAGNOSIS — J45909 Unspecified asthma, uncomplicated: Secondary | ICD-10-CM

## 2021-12-17 DIAGNOSIS — T8131XA Disruption of external operation (surgical) wound, not elsewhere classified, initial encounter: Secondary | ICD-10-CM | POA: Diagnosis present

## 2021-12-17 DIAGNOSIS — Z87891 Personal history of nicotine dependence: Secondary | ICD-10-CM

## 2021-12-17 DIAGNOSIS — Z6841 Body Mass Index (BMI) 40.0 and over, adult: Secondary | ICD-10-CM

## 2021-12-17 DIAGNOSIS — J4520 Mild intermittent asthma, uncomplicated: Secondary | ICD-10-CM | POA: Diagnosis present

## 2021-12-17 DIAGNOSIS — R0609 Other forms of dyspnea: Secondary | ICD-10-CM

## 2021-12-17 DIAGNOSIS — L02419 Cutaneous abscess of limb, unspecified: Secondary | ICD-10-CM | POA: Diagnosis not present

## 2021-12-17 DIAGNOSIS — I10 Essential (primary) hypertension: Secondary | ICD-10-CM | POA: Diagnosis present

## 2021-12-17 LAB — C-REACTIVE PROTEIN: CRP: 5.2 mg/dL — ABNORMAL HIGH (ref <1.0)

## 2021-12-17 LAB — CBC WITH DIFFERENTIAL/PLATELET
Abs Immature Granulocytes: 0.03 10*3/uL (ref 0.00–0.07)
Basophils Absolute: 0 10*3/uL (ref 0.0–0.1)
Basophils Relative: 0 %
Eosinophils Absolute: 0.3 10*3/uL (ref 0.0–0.5)
Eosinophils Relative: 4 %
HCT: 32.4 % — ABNORMAL LOW (ref 36.0–46.0)
Hemoglobin: 9.7 g/dL — ABNORMAL LOW (ref 12.0–15.0)
Immature Granulocytes: 1 %
Lymphocytes Relative: 31 %
Lymphs Abs: 2 10*3/uL (ref 0.7–4.0)
MCH: 28.5 pg (ref 26.0–34.0)
MCHC: 29.9 g/dL — ABNORMAL LOW (ref 30.0–36.0)
MCV: 95.3 fL (ref 80.0–100.0)
Monocytes Absolute: 0.3 10*3/uL (ref 0.1–1.0)
Monocytes Relative: 5 %
Neutro Abs: 3.7 10*3/uL (ref 1.7–7.7)
Neutrophils Relative %: 59 %
Platelets: 300 10*3/uL (ref 150–400)
RBC: 3.4 MIL/uL — ABNORMAL LOW (ref 3.87–5.11)
RDW: 15.1 % (ref 11.5–15.5)
WBC: 6.3 10*3/uL (ref 4.0–10.5)
nRBC: 0.3 % — ABNORMAL HIGH (ref 0.0–0.2)

## 2021-12-17 LAB — BASIC METABOLIC PANEL
Anion gap: 14 (ref 5–15)
BUN: 8 mg/dL (ref 6–20)
CO2: 22 mmol/L (ref 22–32)
Calcium: 9.3 mg/dL (ref 8.9–10.3)
Chloride: 102 mmol/L (ref 98–111)
Creatinine, Ser: 1.32 mg/dL — ABNORMAL HIGH (ref 0.44–1.00)
GFR, Estimated: 49 mL/min — ABNORMAL LOW (ref >60)
Glucose, Bld: 111 mg/dL — ABNORMAL HIGH (ref 70–99)
Potassium: 3.4 mmol/L — ABNORMAL LOW (ref 3.5–5.1)
Sodium: 138 mmol/L (ref 135–145)

## 2021-12-17 MED ORDER — SODIUM CHLORIDE 0.9 % IV BOLUS
1000.0000 mL | Freq: Once | INTRAVENOUS | Status: AC
  Administered 2021-12-17: 1000 mL via INTRAVENOUS

## 2021-12-17 MED ORDER — ONDANSETRON HCL 4 MG/2ML IJ SOLN
4.0000 mg | Freq: Once | INTRAMUSCULAR | Status: AC
  Administered 2021-12-17: 4 mg via INTRAVENOUS
  Filled 2021-12-17: qty 2

## 2021-12-17 MED ORDER — HYDROMORPHONE HCL 1 MG/ML IJ SOLN
1.0000 mg | Freq: Once | INTRAMUSCULAR | Status: AC
  Administered 2021-12-17: 1 mg via INTRAVENOUS
  Filled 2021-12-17: qty 1

## 2021-12-17 MED ORDER — OXYCODONE-ACETAMINOPHEN 5-325 MG PO TABS
1.0000 | ORAL_TABLET | Freq: Once | ORAL | Status: AC
  Administered 2021-12-17: 1 via ORAL
  Filled 2021-12-17: qty 1

## 2021-12-17 NOTE — Telephone Encounter (Signed)
Patient's daughter called complaining that patient has had severe increase in postop shoulder pain without new injury.  She has been very inconsolable and crying this afternoon so they went to a family members house and spent time outdoors.  Bryella has had increased shoulder pain in the last hour with no other symptoms by her daughter's history but patient is not able to speak in full sentences over the phone due to difficulty breathing in between sobs.  Recommended daughter call 911 for eval by ER but daughter states she is going to take her mother by private vehicle and car doors were heard shutting in the background.  This is a stark change compared with office visit from Friday afternoon ?

## 2021-12-17 NOTE — ED Provider Triage Note (Signed)
Emergency Medicine Provider Triage Evaluation Note ? ?Marilyn [name] , a 51 y.o. female  was evaluated in triage.  Pt complains of worsening left shoulder pain.  Per chart review patient status post left reverse shoulder arthroplasty with subsequent irrigation and debridement of superficial infection by Dr. August Saucer on 03/17 and repeat I&D with wound closure by Dr. Lajoyce Corners on 03/22.  She was just seen on Friday with Ortho and her wound was redressed.  She states that the minute she left the clinic she began having worsening pain.  She states that she has been prescribed 10 mg oxycodone without relief of her pain.  She states that she has not taken any since last night since it is no longer working for her.  She denies any fevers or chills.  She called the office today and advised to come in for further evaluation.  She does mention that the wound is draining bloody material.  ? ?Review of Systems  ?Positive: + shoulder pain, drainage ?Negative: - fevers, chills ? ?Physical Exam  ?BP (!) 146/105   Pulse 93   Temp 98.9 ?F (37.2 ?C) (Oral)   Resp 20   LMP  (LMP Unknown) Comment: tubal ligation  SpO2 98%  ?Gen:   Awake, crying ?Resp:  Normal effort  ?MSK:   Moves extremities without difficulty  ?Other:  Bandage to L shoulder not removed in triage. Significant TTP To shoulder. Limited ROM.  ? ?Medical Decision Making  ?Medically screening exam initiated at 9:10 PM.  Appropriate orders placed.  Marilyn [name] was informed that the remainder of the evaluation will be completed by another provider, this initial triage assessment does not replace that evaluation, and the importance of remaining in the ED until their evaluation is complete. ? ? ?  ?Tanda Rockers, PA-C ?12/17/21 2112 ? ?

## 2021-12-17 NOTE — ED Notes (Signed)
Pt hyperventilating and crying out in lobby, stating she can't breathe. Pt taken to triage, VSS ?

## 2021-12-17 NOTE — ED Provider Notes (Signed)
?[facility] ?Provider Note ? ? ?CSN: [id number] ?Arrival date & time: 12/17/21  2046 ? ?  ? ?History ? ?Chief Complaint  ?Patient presents with  ? Post-op Problem  ? ? ?Marilyn [name] is a 51 y.o. female. ? ? ? ?51 year old female history of anemia, anxiety, asthma pretension, obesity, OSA is also status post reverse total shoulder arthroplasty on the left 3/2, subsequent irrigation debridement of superficial infection by Dr. Marlou Sa 3/17, 3/22.  ? ?She was seen in the office 3/31 eval by the PA C Magnant  ? ?Wound was checked, repeat packing.  Noted to have nonpurulent fluid improved from prior. ? ?Patient to the emergency room today secondary to increasing pain to left shoulder.  Pain became worse around lunchtime.  Daughter gave her half of her pain medication because she had not eaten lunch.  Pain progressively worsened.  Difficulty ambulating secondary to severe left shoulder pain. ? ?No fevers, chills, chest pain or dyspnea.  No nausea or vomiting.  No change in bowel or bladder function.  No recent falls. ? ?Accompanied by daughter ? ?Past Medical History: ?No date: Anemia ?    Comment:  taking iron now. pt states having no current issues  ?             09/02/2015 ?No date: Anginal pain ([place]) ?    Comment:  pt states experiences chest wall pain pt states related  ?             to her asthma  ?No date: Anxiety ?    Comment:  with MRI's ?No date: Arthritis ?    Comment:  Everywhere ?No date: Asthma ?No date: Depression ?No date: Dizziness ?No date: GERD (gastroesophageal reflux disease) ?No date: Headache(784.0) ?    Comment:  HX OF MIGRAINES ?No date: History of bronchitis ?No date: Hypertension ?No date: Hypothyroidism ?    Comment:  takes levothyroxen ?No date: OSA on CPAP ?    Comment:  wears cpap ?No date: Shortness of breath ?    Comment:  with exertion ?No date: Wears glasses ? ?Past Surgical History: ?04/25/2799: APPLICATION OF WOUND VAC ?    Comment:  Procedure:  APPLICATION OF WOUND VAC;  Surgeon: Marlou Sa,  ?             Tonna Corner, MD;  Location: [facility];  Service:  ?             Orthopedics;; ?3/49/1791: APPLICATION OF WOUND VAC; Left ?    Comment:  Procedure: APPLICATION OF WOUND VAC;  Surgeon: Marlou Sa,  ?             Tonna Corner, MD;  Location: [facility];  Service:  ?             Orthopedics;  Laterality: Left; ?No date: BACK SURGERY ?No date: CESAREAN SECTION ?    Comment:  times 2 ?No date: CHOLECYSTECTOMY ?2020: COLONOSCOPY ?No date: ENDOMETRIAL ABLATION ?12/06/2021: I & D EXTREMITY; Left ?    Comment:  Procedure: LEFT SHOULDER DEBRIDEMENT AND WOUND CLOSURE;  ?             Surgeon: Newt Minion, MD;  Location: [facility];  Service:  ?             Orthopedics;  Laterality: Left; ?12/01/2021: INCISION AND DRAINAGE; Left ?    Comment:  Procedure: LEFT SHOULDER IRRIGATION AND EXCISIONAL  ?  DEBRIDEMENT;  Surgeon: Meredith Pel, MD;   ?             Location: [facility];  Service: Orthopedics;  Laterality:  ?             Left; ?11/10/2015: INCISION AND DRAINAGE HIP; Left ?    Comment:  Procedure: IRRIGATION AND DEBRIDEMENT LEFT HIP INCISION; ?             Surgeon: Mcarthur Rossetti, MD;  Location: [facility];   ?             Service: Orthopedics;  Laterality: Left; ?2011: JOINT REPLACEMENT ?    Comment:  total left knee ?04/23/2012: KNEE ARTHROPLASTY ?    Comment:  right  ?No date: KNEE ARTHROSCOPY ?06/23/2019: LUMBAR LAMINECTOMY/DECOMPRESSION MICRODISCECTOMY; N/A ?    Comment:  Procedure: RIGHT LUMBAR FIVE THROUGH SACRAL ONE PARTIAL  ?             HEMILAMINECTOMY WITH RIGHT LUMBAR FIVE FORAMINOTOMY;   ?             Surgeon: Jessy Oto, MD;  Location: [facility];  Service:  ?             Orthopedics;  Laterality: N/A; ?06/11/2018: LUMBAR WOUND DEBRIDEMENT; N/A ?    Comment:  Procedure: LUMBAR WOUND DEBRIDEMENT;  Surgeon: Louanne Skye,  ?             Daleen Bo, MD;  Location: [facility];  Service: Orthopedics;   ?             Laterality: N/A; ?09/09/2018: RADIOLOGY WITH ANESTHESIA; N/A ?     Comment:  Procedure: LUMBER SPINE WITHOUT CONTRAST;  Surgeon:  ?             Radiologist, Medication, MD;  Location: [facility];  Service:  ?             Radiology;  Laterality: N/A; ?11/16/2021: REVERSE SHOULDER ARTHROPLASTY; Left ?    Comment:  Procedure: LEFT REVERSE SHOULDER ARTHROPLASTY;  Surgeon: ?             Meredith Pel, MD;  Location: [facility];  Service:  ?             Orthopedics;  Laterality: Left; ?No date: ROTATOR CUFF REPAIR ?    Comment:  left  ?No date: SHOULDER SURGERY ?    Comment:  right to repair ligament tear ?09/09/2015: TOTAL HIP ARTHROPLASTY; Left ?    Comment:  Procedure: LEFT TOTAL HIP ARTHROPLASTY ANTERIOR  ?             APPROACH;  Surgeon: Mcarthur Rossetti, MD;   ?             Location: [facility];  Service: Orthopedics;  Laterality:  ?             Left; ?04/23/2012: TOTAL KNEE ARTHROPLASTY ?    Comment:  Procedure: TOTAL KNEE ARTHROPLASTY;  Surgeon: Illene Regulus  ?             Sharol Given, MD;  Location: [facility];  Service: Orthopedics;   ?             Laterality: Right;  Right Total Knee Arthroplasty ?1996: TUBAL LIGATION  ? ? ?The history is provided by the patient and a relative. No language interpreter was used.  ? ?  ? ?Home Medications ?Prior to Admission medications   ?Medication Sig Start Date End  Date Taking? Authorizing Provider  ?acetaminophen (TYLENOL) 500 MG tablet Take 1,000 mg by mouth every 6 (six) hours as needed for moderate pain or headache.   Yes [provider]  ?albuterol (VENTOLIN HFA) 108 (90 Base) MCG/ACT inhaler Inhale 1-2 puffs into the lungs every 6 (six) hours as needed for wheezing or shortness of breath. 12/09/19  Yes Henderly, Britni A, PA-C  ?allopurinol (ZYLOPRIM) 100 MG tablet Take 1 tablet by mouth twice daily ?Patient taking differently: Take 100 mg by mouth 2 (two) times daily. 10/30/21  Yes Jessy Oto, MD  ?ammonium lactate (LAC-HYDRIN) 12 % lotion Apply 1 application. topically 2 (two) times daily as needed for dry skin. 07/17/21  Yes [provider]  ?ascorbic Acid (VITAMIN C) 500 MG CPCR Take 500 mg by mouth daily. Gummy   Yes [provider]  ?aspirin EC 81 MG EC tablet Take 1 tablet (81 mg total) by mouth daily. Swallow whole. 11/19/21  Yes Magnant, Charles L, PA-C  ?buPROPion (WELLBUTRIN XL) 300 MG 24 hr tablet Take 300 mg by mouth daily. 06/29/20  Yes [provider]  ?cetirizine (ZYRTEC) 10 MG tablet Take 10 mg by mouth daily.   Yes [provider]  ?clotrimazole-betamethasone (LOTRISONE) cream Apply 1 application topically daily as needed (irritation). 10/25/19  Yes [provider]  ?cyclobenzaprine (FLEXERIL) 10 MG tablet Take 10-20 mg by mouth See admin instructions. 10 mg in the morning  ?20 mg at bedtime 12/15/19  Yes [provider]  ?cycloSPORINE (RESTASIS) 0.05 % ophthalmic emulsion Place 1 drop into both eyes 2 (two) times daily as needed (dry eyes).   Yes [provider]  ?DULoxetine (CYMBALTA) 60 MG capsule Take 60 mg by mouth 2 (two) times daily. 05/02/21  Yes [provider]  ?Ferrous Gluconate 324 (37.5 Fe) MG TABS Take 324 mg by mouth daily. 02/14/20  Yes [provider]  ?furosemide (LASIX) 20 MG tablet Take 20 mg by mouth daily.   Yes [provider]  ?gabapentin (NEURONTIN) 400 MG capsule Take 400 mg by mouth 2 (two) times daily.   Yes [provider]  ?hydrochlorothiazide (HYDRODIURIL) 25 MG tablet Take 25 mg by mouth daily. 09/15/21  Yes [provider]  ?levothyroxine (SYNTHROID) 150 MCG tablet Take 150 mcg by mouth daily before breakfast. 05/18/19  Yes [provider]  ?NARCAN 4 MG/0.1ML LIQD nasal spray kit Place 0.4 mg into the nose once as needed (opiate overdose). 06/28/17  Yes [provider]  ?oxybutynin (DITROPAN XL) 15 MG 24 hr tablet Take 15 mg by mouth daily.  05/31/17  Yes [provider]  ?oxyCODONE-acetaminophen (PERCOCET) 10-325 MG tablet Take 1 tablet by mouth every 4 (four) hours as  needed for pain. 12/11/21 12/11/22 Yes Magnant, Charles L, PA-C  ?vitamin B-12 (CYANOCOBALAMIN) 250 MCG tablet Take 250 mcg by mouth daily.   Yes [provider]  ?Vitamin D, Ergocalciferol, (DRISDOL) 1.25

## 2021-12-17 NOTE — ED Triage Notes (Signed)
Pt reported to ED with c/o severe pain to let shoulder following should replacement procedure on 11/16/2021. Pt states that site was infected and she spent time in hospital to treat infection. Pt reports flowing up with ortho on Friday. States her pain medication is not working so she no longer takes medication.  ?

## 2021-12-18 ENCOUNTER — Emergency Department (HOSPITAL_COMMUNITY): Payer: Medicare Other

## 2021-12-18 ENCOUNTER — Inpatient Hospital Stay (HOSPITAL_COMMUNITY): Payer: Medicare Other

## 2021-12-18 ENCOUNTER — Encounter: Payer: Medicare Other | Admitting: Orthopedic Surgery

## 2021-12-18 ENCOUNTER — Encounter (HOSPITAL_COMMUNITY): Payer: Medicare Other

## 2021-12-18 DIAGNOSIS — Z91018 Allergy to other foods: Secondary | ICD-10-CM | POA: Diagnosis not present

## 2021-12-18 DIAGNOSIS — D649 Anemia, unspecified: Secondary | ICD-10-CM

## 2021-12-18 DIAGNOSIS — R0609 Other forms of dyspnea: Secondary | ICD-10-CM

## 2021-12-18 DIAGNOSIS — M109 Gout, unspecified: Secondary | ICD-10-CM | POA: Diagnosis present

## 2021-12-18 DIAGNOSIS — Z91040 Latex allergy status: Secondary | ICD-10-CM | POA: Diagnosis not present

## 2021-12-18 DIAGNOSIS — Z9989 Dependence on other enabling machines and devices: Secondary | ICD-10-CM | POA: Diagnosis not present

## 2021-12-18 DIAGNOSIS — Z6841 Body Mass Index (BMI) 40.0 and over, adult: Secondary | ICD-10-CM | POA: Diagnosis not present

## 2021-12-18 DIAGNOSIS — E872 Acidosis, unspecified: Secondary | ICD-10-CM | POA: Diagnosis present

## 2021-12-18 DIAGNOSIS — T8149XA Infection following a procedure, other surgical site, initial encounter: Secondary | ICD-10-CM

## 2021-12-18 DIAGNOSIS — F32A Depression, unspecified: Secondary | ICD-10-CM | POA: Diagnosis present

## 2021-12-18 DIAGNOSIS — L02414 Cutaneous abscess of left upper limb: Secondary | ICD-10-CM | POA: Diagnosis present

## 2021-12-18 DIAGNOSIS — D62 Acute posthemorrhagic anemia: Secondary | ICD-10-CM | POA: Diagnosis not present

## 2021-12-18 DIAGNOSIS — Z888 Allergy status to other drugs, medicaments and biological substances status: Secondary | ICD-10-CM | POA: Diagnosis not present

## 2021-12-18 DIAGNOSIS — M25512 Pain in left shoulder: Secondary | ICD-10-CM

## 2021-12-18 DIAGNOSIS — I10 Essential (primary) hypertension: Secondary | ICD-10-CM | POA: Diagnosis present

## 2021-12-18 DIAGNOSIS — K219 Gastro-esophageal reflux disease without esophagitis: Secondary | ICD-10-CM | POA: Diagnosis present

## 2021-12-18 DIAGNOSIS — E039 Hypothyroidism, unspecified: Secondary | ICD-10-CM | POA: Diagnosis present

## 2021-12-18 DIAGNOSIS — E876 Hypokalemia: Secondary | ICD-10-CM | POA: Diagnosis present

## 2021-12-18 DIAGNOSIS — T8131XA Disruption of external operation (surgical) wound, not elsewhere classified, initial encounter: Secondary | ICD-10-CM | POA: Diagnosis present

## 2021-12-18 DIAGNOSIS — G4733 Obstructive sleep apnea (adult) (pediatric): Secondary | ICD-10-CM | POA: Diagnosis present

## 2021-12-18 DIAGNOSIS — Z91030 Bee allergy status: Secondary | ICD-10-CM | POA: Diagnosis not present

## 2021-12-18 DIAGNOSIS — J4520 Mild intermittent asthma, uncomplicated: Secondary | ICD-10-CM | POA: Diagnosis present

## 2021-12-18 DIAGNOSIS — Z20822 Contact with and (suspected) exposure to covid-19: Secondary | ICD-10-CM | POA: Diagnosis present

## 2021-12-18 DIAGNOSIS — Z96642 Presence of left artificial hip joint: Secondary | ICD-10-CM | POA: Diagnosis present

## 2021-12-18 DIAGNOSIS — F418 Other specified anxiety disorders: Secondary | ICD-10-CM | POA: Diagnosis not present

## 2021-12-18 DIAGNOSIS — F419 Anxiety disorder, unspecified: Secondary | ICD-10-CM | POA: Diagnosis present

## 2021-12-18 DIAGNOSIS — Y831 Surgical operation with implant of artificial internal device as the cause of abnormal reaction of the patient, or of later complication, without mention of misadventure at the time of the procedure: Secondary | ICD-10-CM | POA: Diagnosis present

## 2021-12-18 DIAGNOSIS — Z87891 Personal history of nicotine dependence: Secondary | ICD-10-CM | POA: Diagnosis not present

## 2021-12-18 DIAGNOSIS — L02419 Cutaneous abscess of limb, unspecified: Secondary | ICD-10-CM | POA: Diagnosis present

## 2021-12-18 DIAGNOSIS — Z885 Allergy status to narcotic agent status: Secondary | ICD-10-CM | POA: Diagnosis not present

## 2021-12-18 DIAGNOSIS — Z886 Allergy status to analgesic agent status: Secondary | ICD-10-CM | POA: Diagnosis not present

## 2021-12-18 LAB — HIV ANTIBODY (ROUTINE TESTING W REFLEX): HIV Screen 4th Generation wRfx: NONREACTIVE

## 2021-12-18 LAB — URIC ACID: Uric Acid, Serum: 8.6 mg/dL — ABNORMAL HIGH (ref 2.5–7.1)

## 2021-12-18 LAB — RESP PANEL BY RT-PCR (FLU A&B, COVID) ARPGX2
Influenza A by PCR: NEGATIVE
Influenza B by PCR: NEGATIVE
SARS Coronavirus 2 by RT PCR: NEGATIVE

## 2021-12-18 LAB — SEDIMENTATION RATE: Sed Rate: 106 mm/hr — ABNORMAL HIGH (ref 0–22)

## 2021-12-18 LAB — D-DIMER, QUANTITATIVE: D-Dimer, Quant: 2.47 ug/mL-FEU — ABNORMAL HIGH (ref 0.00–0.50)

## 2021-12-18 LAB — MAGNESIUM: Magnesium: 1.5 mg/dL — ABNORMAL LOW (ref 1.7–2.4)

## 2021-12-18 LAB — LACTIC ACID, PLASMA
Lactic Acid, Venous: 2.7 mmol/L (ref 0.5–1.9)
Lactic Acid, Venous: 1.7 mmol/L (ref 0.5–1.9)

## 2021-12-18 LAB — TROPONIN I (HIGH SENSITIVITY): Troponin I (High Sensitivity): 7 ng/L (ref <18)

## 2021-12-18 MED ORDER — LORATADINE 10 MG PO TABS
10.0000 mg | ORAL_TABLET | Freq: Every day | ORAL | Status: DC
  Administered 2021-12-19 – 2021-12-23 (×3): 10 mg via ORAL
  Filled 2021-12-18 (×3): qty 1

## 2021-12-18 MED ORDER — CYCLOBENZAPRINE HCL 10 MG PO TABS: 10.0000 mg | ORAL_TABLET | ORAL | Status: DC

## 2021-12-18 MED ORDER — MAGNESIUM SULFATE 2 GM/50ML IV SOLN
2.0000 g | Freq: Once | INTRAVENOUS | Status: AC
  Administered 2021-12-18: 2 g via INTRAVENOUS
  Filled 2021-12-18: qty 50

## 2021-12-18 MED ORDER — LEVOTHYROXINE SODIUM 75 MCG PO TABS
150.0000 ug | ORAL_TABLET | Freq: Every day | ORAL | Status: DC
  Administered 2021-12-18 – 2021-12-23 (×6): 150 ug via ORAL
  Filled 2021-12-18 (×6): qty 2

## 2021-12-18 MED ORDER — PIPERACILLIN-TAZOBACTAM 3.375 G IVPB
3.3750 g | Freq: Once | INTRAVENOUS | Status: AC
Start: 2021-12-18 — End: 2021-12-18
  Administered 2021-12-18: 3.375 g via INTRAVENOUS
  Filled 2021-12-18: qty 50

## 2021-12-18 MED ORDER — PIPERACILLIN-TAZOBACTAM 3.375 G IVPB
3.3750 g | Freq: Three times a day (TID) | INTRAVENOUS | Status: DC
Start: 2021-12-18 — End: 2021-12-22
  Administered 2021-12-18 – 2021-12-22 (×12): 3.375 g via INTRAVENOUS
  Filled 2021-12-18 (×15): qty 50

## 2021-12-18 MED ORDER — VANCOMYCIN HCL IN DEXTROSE 1-5 GM/200ML-% IV SOLN
1000.0000 mg | Freq: Once | INTRAVENOUS | Status: AC
  Administered 2021-12-18: 1000 mg via INTRAVENOUS
  Filled 2021-12-18: qty 200

## 2021-12-18 MED ORDER — ACETAMINOPHEN 325 MG PO TABS
650.0000 mg | ORAL_TABLET | Freq: Four times a day (QID) | ORAL | Status: DC | PRN
  Filled 2021-12-18: qty 2

## 2021-12-18 MED ORDER — ONDANSETRON HCL 4 MG/2ML IJ SOLN
INTRAMUSCULAR | Status: AC
  Filled 2021-12-18: qty 2

## 2021-12-18 MED ORDER — HYDROMORPHONE HCL 1 MG/ML IJ SOLN
1.0000 mg | INTRAMUSCULAR | Status: DC | PRN
  Administered 2021-12-18 – 2021-12-23 (×14): 1 mg via INTRAVENOUS
  Filled 2021-12-18 (×14): qty 1

## 2021-12-18 MED ORDER — GABAPENTIN 400 MG PO CAPS
400.0000 mg | ORAL_CAPSULE | Freq: Two times a day (BID) | ORAL | Status: DC
  Administered 2021-12-18 – 2021-12-19 (×2): 400 mg via ORAL
  Filled 2021-12-18 (×2): qty 1

## 2021-12-18 MED ORDER — ALLOPURINOL 100 MG PO TABS
100.0000 mg | ORAL_TABLET | Freq: Two times a day (BID) | ORAL | Status: DC
  Administered 2021-12-18 – 2021-12-23 (×9): 100 mg via ORAL
  Filled 2021-12-18 (×9): qty 1

## 2021-12-18 MED ORDER — OXYBUTYNIN CHLORIDE ER 15 MG PO TB24
15.0000 mg | ORAL_TABLET | Freq: Every day | ORAL | Status: DC
Start: 2021-12-18 — End: 2021-12-23
  Administered 2021-12-19 – 2021-12-23 (×3): 15 mg via ORAL
  Filled 2021-12-18 (×6): qty 1

## 2021-12-18 MED ORDER — NALOXONE HCL 0.4 MG/ML IJ SOLN: 0.4000 mg | INTRAMUSCULAR | Status: DC | PRN

## 2021-12-18 MED ORDER — POTASSIUM CHLORIDE CRYS ER 20 MEQ PO TBCR
40.0000 meq | EXTENDED_RELEASE_TABLET | Freq: Once | ORAL | Status: AC
  Administered 2021-12-18: 40 meq via ORAL
  Filled 2021-12-18: qty 2

## 2021-12-18 MED ORDER — HYDROMORPHONE HCL 1 MG/ML IJ SOLN
1.0000 mg | Freq: Once | INTRAMUSCULAR | Status: AC
  Administered 2021-12-18: 1 mg via INTRAVENOUS
  Filled 2021-12-18: qty 1

## 2021-12-18 MED ORDER — ONDANSETRON HCL 4 MG/2ML IJ SOLN
4.0000 mg | Freq: Four times a day (QID) | INTRAMUSCULAR | Status: DC | PRN
  Administered 2021-12-18: 4 mg via INTRAVENOUS

## 2021-12-18 MED ORDER — VANCOMYCIN HCL IN DEXTROSE 1-5 GM/200ML-% IV SOLN
1000.0000 mg | INTRAVENOUS | Status: AC
  Filled 2021-12-18: qty 200

## 2021-12-18 MED ORDER — VANCOMYCIN HCL 1750 MG/350ML IV SOLN
1750.0000 mg | INTRAVENOUS | Status: DC
Start: 2021-12-19 — End: 2021-12-19

## 2021-12-18 MED ORDER — FERROUS GLUCONATE 324 (38 FE) MG PO TABS
324.0000 mg | ORAL_TABLET | Freq: Every day | ORAL | Status: DC
  Administered 2021-12-19 – 2021-12-23 (×3): 324 mg via ORAL
  Filled 2021-12-18 (×6): qty 1

## 2021-12-18 MED ORDER — HYDROCHLOROTHIAZIDE 25 MG PO TABS
25.0000 mg | ORAL_TABLET | Freq: Every day | ORAL | Status: DC
  Administered 2021-12-19 – 2021-12-23 (×3): 25 mg via ORAL
  Filled 2021-12-18 (×3): qty 1

## 2021-12-18 MED ORDER — ACETAMINOPHEN 650 MG RE SUPP: 650.0000 mg | Freq: Four times a day (QID) | RECTAL | Status: DC | PRN

## 2021-12-18 MED ORDER — ALBUTEROL SULFATE (2.5 MG/3ML) 0.083% IN NEBU
2.5000 mg | INHALATION_SOLUTION | Freq: Four times a day (QID) | RESPIRATORY_TRACT | Status: DC | PRN
  Administered 2021-12-18: 2.5 mg via RESPIRATORY_TRACT
  Filled 2021-12-18: qty 3

## 2021-12-18 NOTE — Assessment & Plan Note (Signed)
Continue Synthroid °

## 2021-12-18 NOTE — Consult Note (Signed)
? ?ORTHOPAEDIC CONSULTATION ? ?REQUESTING PHYSICIAN: Barb Merino, MD ? ?Chief Complaint: Left shoulder pain. ? ?HPI: ?Marilyn [name] is a 51 y.o. female who presents with recurrent abscess left shoulder.  Patient is status post total shoulder arthroplasty who has undergone serial debridements for a superficial infection.  Her last evaluation in the office she had healthy granulation tissue.  Patient presents at this time with acute pain and purulent drainage. ? ?Past Medical History:  ?Diagnosis Date  ? Anemia   ? taking iron now. pt states having no current issues 09/02/2015  ? Anginal pain ([place])   ? pt states experiences chest wall pain pt states related to her asthma   ? Anxiety   ? with MRI's  ? Arthritis   ? Everywhere  ? Asthma   ? Depression   ? Dizziness   ? GERD (gastroesophageal reflux disease)   ? Headache(784.0)   ? HX OF MIGRAINES  ? History of bronchitis   ? Hypertension   ? Hypothyroidism   ? takes levothyroxen  ? OSA on CPAP   ? wears cpap  ? Shortness of breath   ? with exertion  ? Wears glasses   ? ?Past Surgical History:  ?Procedure Laterality Date  ? APPLICATION OF WOUND VAC  11/16/2021  ? Procedure: APPLICATION OF WOUND VAC;  Surgeon: Meredith Pel, MD;  Location: [facility];  Service: Orthopedics;;  ? APPLICATION OF WOUND VAC Left 12/01/2021  ? Procedure: APPLICATION OF WOUND VAC;  Surgeon: Meredith Pel, MD;  Location: [facility];  Service: Orthopedics;  Laterality: Left;  ? BACK SURGERY    ? CESAREAN SECTION    ? times 2  ? CHOLECYSTECTOMY    ? COLONOSCOPY  2020  ? ENDOMETRIAL ABLATION    ? I & D EXTREMITY Left 12/06/2021  ? Procedure: LEFT SHOULDER DEBRIDEMENT AND WOUND CLOSURE;  Surgeon: Newt Minion, MD;  Location: [facility];  Service: Orthopedics;  Laterality: Left;  ? INCISION AND DRAINAGE Left 12/01/2021  ? Procedure: LEFT SHOULDER IRRIGATION AND EXCISIONAL DEBRIDEMENT;  Surgeon: Meredith Pel, MD;  Location: [facility];  Service: Orthopedics;  Laterality: Left;  ? INCISION AND  DRAINAGE HIP Left 11/10/2015  ? Procedure: IRRIGATION AND DEBRIDEMENT LEFT HIP INCISION;  Surgeon: Mcarthur Rossetti, MD;  Location: [facility];  Service: Orthopedics;  Laterality: Left;  ? JOINT REPLACEMENT  2011  ? total left knee  ? KNEE ARTHROPLASTY  04/23/2012  ? right   ? KNEE ARTHROSCOPY    ? LUMBAR LAMINECTOMY/DECOMPRESSION MICRODISCECTOMY N/A 06/23/2019  ? Procedure: RIGHT LUMBAR FIVE THROUGH SACRAL ONE PARTIAL HEMILAMINECTOMY WITH RIGHT LUMBAR FIVE FORAMINOTOMY;  Surgeon: Jessy Oto, MD;  Location: [facility];  Service: Orthopedics;  Laterality: N/A;  ? LUMBAR WOUND DEBRIDEMENT N/A 06/11/2018  ? Procedure: LUMBAR WOUND DEBRIDEMENT;  Surgeon: Jessy Oto, MD;  Location: [facility];  Service: Orthopedics;  Laterality: N/A;  ? RADIOLOGY WITH ANESTHESIA N/A 09/09/2018  ? Procedure: LUMBER SPINE WITHOUT CONTRAST;  Surgeon: Radiologist, Medication, MD;  Location: [facility];  Service: Radiology;  Laterality: N/A;  ? REVERSE SHOULDER ARTHROPLASTY Left 11/16/2021  ? Procedure: LEFT REVERSE SHOULDER ARTHROPLASTY;  Surgeon: Meredith Pel, MD;  Location: [facility];  Service: Orthopedics;  Laterality: Left;  ? ROTATOR CUFF REPAIR    ? left   ? SHOULDER SURGERY    ? right to repair ligament tear  ? TOTAL HIP ARTHROPLASTY Left 09/09/2015  ? Procedure: LEFT TOTAL HIP ARTHROPLASTY ANTERIOR APPROACH;  Surgeon: Lind Guest  Ninfa Linden, MD;  Location: [facility];  Service: Orthopedics;  Laterality: Left;  ? TOTAL KNEE ARTHROPLASTY  04/23/2012  ? Procedure: TOTAL KNEE ARTHROPLASTY;  Surgeon: Newt Minion, MD;  Location: [facility];  Service: Orthopedics;  Laterality: Right;  Right Total Knee Arthroplasty  ? TUBAL LIGATION  1996  ? ?Social History  ? ?Socioeconomic History  ? Marital status: Single  ?  Spouse name: Not on file  ? Number of children: 2  ? Years of education: Not on file  ? Highest education level: Not on file  ?Occupational History  ? Not on file  ?Tobacco Use  ? Smoking status: Former  ?  Packs/day: 0.50  ?  Years: 4.00  ?   Pack years: 2.00  ?  Types: Cigarettes  ?  Quit date: 09/18/1987  ?  Years since quitting: 34.2  ? Smokeless tobacco: Never  ?Vaping Use  ? Vaping Use: Never used  ?Substance and Sexual Activity  ? Alcohol use: No  ? Drug use: No  ? Sexual activity: Yes  ?  Birth control/protection: Surgical  ?Other Topics Concern  ? Not on file  ?Social History Narrative  ? [name], daughter may update  ? ?Social Determinants of Health  ? ?Financial Resource Strain: Not on file  ?Food Insecurity: Not on file  ?Transportation Needs: Not on file  ?Physical Activity: Not on file  ?Stress: Not on file  ?Social Connections: Not on file  ? ?Family History  ?Problem Relation Age of Onset  ? Cancer Mother   ?     colon  ? Epilepsy Mother   ? Cancer Father   ?     prostate  ? Diabetes Father   ? Hypertension Father   ? Hypertension Maternal Aunt   ? Diabetes Maternal Aunt   ? Hypertension Paternal Aunt   ? ?- negative except otherwise stated in the family history section ?Allergies  ?Allergen Reactions  ? Lisinopril Anaphylaxis  ?  Angioedema  ? Bee Venom Swelling and Other (See Comments)  ?  Swelling at the site  ? Bupropion Swelling  ? Latex Itching and Rash  ? Propoxyphene Hives  ? Methadone Other (See Comments)  ?  unknown  ? Codeine Nausea Only  ? Meloxicam Other (See Comments)  ?  Insomnia, constipation  ? Morphine And Related Rash  ? Tegaderm Ag Mesh [Silver] Hives, Itching and Rash  ? Tomato Rash  ? ?Prior to Admission medications   ?Medication Sig Start Date End Date Taking? Authorizing Provider  ?acetaminophen (TYLENOL) 500 MG tablet Take 1,000 mg by mouth every 6 (six) hours as needed for moderate pain or headache.   Yes [provider]  ?albuterol (VENTOLIN HFA) 108 (90 Base) MCG/ACT inhaler Inhale 1-2 puffs into the lungs every 6 (six) hours as needed for wheezing or shortness of breath. 12/09/19  Yes Henderly, Britni A, PA-C  ?allopurinol (ZYLOPRIM) 100 MG tablet Take 1 tablet by mouth twice daily ?Patient  taking differently: Take 100 mg by mouth 2 (two) times daily. 10/30/21  Yes Jessy Oto, MD  ?ammonium lactate (LAC-HYDRIN) 12 % lotion Apply 1 application. topically 2 (two) times daily as needed for dry skin. 07/17/21  Yes [provider]  ?ascorbic Acid (VITAMIN C) 500 MG CPCR Take 500 mg by mouth daily. Gummy   Yes [provider]  ?aspirin EC 81 MG EC tablet Take 1 tablet (81 mg total) by mouth daily. Swallow whole. 11/19/21  Yes Magnant, Gerrianne Scale, PA-C  ?  buPROPion (WELLBUTRIN XL) 300 MG 24 hr tablet Take 300 mg by mouth daily. 06/29/20  Yes [provider]  ?cetirizine (ZYRTEC) 10 MG tablet Take 10 mg by mouth daily.   Yes [provider]  ?clotrimazole-betamethasone (LOTRISONE) cream Apply 1 application topically daily as needed (irritation). 10/25/19  Yes [provider]  ?cyclobenzaprine (FLEXERIL) 10 MG tablet Take 10-20 mg by mouth See admin instructions. 10 mg in the morning  ?20 mg at bedtime 12/15/19  Yes [provider]  ?cycloSPORINE (RESTASIS) 0.05 % ophthalmic emulsion Place 1 drop into both eyes 2 (two) times daily as needed (dry eyes).   Yes [provider]  ?DULoxetine (CYMBALTA) 60 MG capsule Take 60 mg by mouth 2 (two) times daily. 05/02/21  Yes [provider]  ?Ferrous Gluconate 324 (37.5 Fe) MG TABS Take 324 mg by mouth daily. 02/14/20  Yes [provider]  ?furosemide (LASIX) 20 MG tablet Take 20 mg by mouth daily.   Yes [provider]  ?gabapentin (NEURONTIN) 400 MG capsule Take 400 mg by mouth 2 (two) times daily.   Yes [provider]  ?hydrochlorothiazide (HYDRODIURIL) 25 MG tablet Take 25 mg by mouth daily. 09/15/21  Yes [provider]  ?levothyroxine (SYNTHROID) 150 MCG tablet Take 150 mcg by mouth daily before breakfast. 05/18/19  Yes [provider]  ?NARCAN 4 MG/0.1ML LIQD nasal spray kit Place 0.4 mg into the nose once as needed (opiate overdose). 06/28/17  Yes  [provider]  ?oxybutynin (DITROPAN XL) 15 MG 24 hr tablet Take 15 mg by mouth daily.  05/31/17  Yes [provider]  ?oxyCODONE-acetaminophen (PERCOCET) 10-325 MG tablet Take 1 tablet by mouth eve

## 2021-12-18 NOTE — Assessment & Plan Note (Signed)
Mild. ?-Replace potassium.  Check magnesium level and replace if low. ?

## 2021-12-18 NOTE — Assessment & Plan Note (Signed)
Hemoglobin 9.7, stable.  Likely due to blood loss from recent surgeries.  Patient is not endorsing any symptoms of GI bleed. ?-Continue home iron supplement ?

## 2021-12-18 NOTE — Progress Notes (Signed)
Arrived to patient's room. 2nd attempt to see patient to assess for PIV placement per consult. Patient in bathroom at this time. Notified nurse to re-consult IV team when patient is in bed and ready for IV team to assess and place PIV. VU Tomasita Morrow, RN VAST ?

## 2021-12-18 NOTE — H&P (View-Only) (Signed)
? ?ORTHOPAEDIC CONSULTATION ? ?REQUESTING PHYSICIAN: Barb Merino, MD ? ?Chief Complaint: Left shoulder pain. ? ?HPI: ?Marilyn [name] is a 51 y.o. female who presents with recurrent abscess left shoulder.  Patient is status post total shoulder arthroplasty who has undergone serial debridements for a superficial infection.  Her last evaluation in the office she had healthy granulation tissue.  Patient presents at this time with acute pain and purulent drainage. ? ?Past Medical History:  ?Diagnosis Date  ? Anemia   ? taking iron now. pt states having no current issues 09/02/2015  ? Anginal pain ([place])   ? pt states experiences chest wall pain pt states related to her asthma   ? Anxiety   ? with MRI's  ? Arthritis   ? Everywhere  ? Asthma   ? Depression   ? Dizziness   ? GERD (gastroesophageal reflux disease)   ? Headache(784.0)   ? HX OF MIGRAINES  ? History of bronchitis   ? Hypertension   ? Hypothyroidism   ? takes levothyroxen  ? OSA on CPAP   ? wears cpap  ? Shortness of breath   ? with exertion  ? Wears glasses   ? ?Past Surgical History:  ?Procedure Laterality Date  ? APPLICATION OF WOUND VAC  11/16/2021  ? Procedure: APPLICATION OF WOUND VAC;  Surgeon: Meredith Pel, MD;  Location: [place];  Service: Orthopedics;;  ? APPLICATION OF WOUND VAC Left 12/01/2021  ? Procedure: APPLICATION OF WOUND VAC;  Surgeon: Meredith Pel, MD;  Location: [place];  Service: Orthopedics;  Laterality: Left;  ? BACK SURGERY    ? CESAREAN SECTION    ? times 2  ? CHOLECYSTECTOMY    ? COLONOSCOPY  2020  ? ENDOMETRIAL ABLATION    ? I & D EXTREMITY Left 12/06/2021  ? Procedure: LEFT SHOULDER DEBRIDEMENT AND WOUND CLOSURE;  Surgeon: Newt Minion, MD;  Location: [place];  Service: Orthopedics;  Laterality: Left;  ? INCISION AND DRAINAGE Left 12/01/2021  ? Procedure: LEFT SHOULDER IRRIGATION AND EXCISIONAL DEBRIDEMENT;  Surgeon: Meredith Pel, MD;  Location: [place];  Service: Orthopedics;  Laterality: Left;  ? INCISION AND  DRAINAGE HIP Left 11/10/2015  ? Procedure: IRRIGATION AND DEBRIDEMENT LEFT HIP INCISION;  Surgeon: Mcarthur Rossetti, MD;  Location: [place];  Service: Orthopedics;  Laterality: Left;  ? JOINT REPLACEMENT  2011  ? total left knee  ? KNEE ARTHROPLASTY  04/23/2012  ? right   ? KNEE ARTHROSCOPY    ? LUMBAR LAMINECTOMY/DECOMPRESSION MICRODISCECTOMY N/A 06/23/2019  ? Procedure: RIGHT LUMBAR FIVE THROUGH SACRAL ONE PARTIAL HEMILAMINECTOMY WITH RIGHT LUMBAR FIVE FORAMINOTOMY;  Surgeon: Jessy Oto, MD;  Location: [place];  Service: Orthopedics;  Laterality: N/A;  ? LUMBAR WOUND DEBRIDEMENT N/A 06/11/2018  ? Procedure: LUMBAR WOUND DEBRIDEMENT;  Surgeon: Jessy Oto, MD;  Location: [place];  Service: Orthopedics;  Laterality: N/A;  ? RADIOLOGY WITH ANESTHESIA N/A 09/09/2018  ? Procedure: LUMBER SPINE WITHOUT CONTRAST;  Surgeon: Radiologist, Medication, MD;  Location: [place];  Service: Radiology;  Laterality: N/A;  ? REVERSE SHOULDER ARTHROPLASTY Left 11/16/2021  ? Procedure: LEFT REVERSE SHOULDER ARTHROPLASTY;  Surgeon: Meredith Pel, MD;  Location: [place];  Service: Orthopedics;  Laterality: Left;  ? ROTATOR CUFF REPAIR    ? left   ? SHOULDER SURGERY    ? right to repair ligament tear  ? TOTAL HIP ARTHROPLASTY Left 09/09/2015  ? Procedure: LEFT TOTAL HIP ARTHROPLASTY ANTERIOR APPROACH;  Surgeon: Lind Guest  Ninfa Linden, MD;  Location: [facility];  Service: Orthopedics;  Laterality: Left;  ? TOTAL KNEE ARTHROPLASTY  04/23/2012  ? Procedure: TOTAL KNEE ARTHROPLASTY;  Surgeon: Newt Minion, MD;  Location: [place];  Service: Orthopedics;  Laterality: Right;  Right Total Knee Arthroplasty  ? TUBAL LIGATION  1996  ? ?Social History  ? ?Socioeconomic History  ? Marital status: Single  ?  Spouse name: Not on file  ? Number of children: 2  ? Years of education: Not on file  ? Highest education level: Not on file  ?Occupational History  ? Not on file  ?Tobacco Use  ? Smoking status: Former  ?  Packs/day: 0.50  ?  Years: 4.00  ?   Pack years: 2.00  ?  Types: Cigarettes  ?  Quit date: 09/18/1987  ?  Years since quitting: 34.2  ? Smokeless tobacco: Never  ?Vaping Use  ? Vaping Use: Never used  ?Substance and Sexual Activity  ? Alcohol use: No  ? Drug use: No  ? Sexual activity: Yes  ?  Birth control/protection: Surgical  ?Other Topics Concern  ? Not on file  ?Social History Narrative  ? [name], daughter may update  ? ?Social Determinants of Health  ? ?Financial Resource Strain: Not on file  ?Food Insecurity: Not on file  ?Transportation Needs: Not on file  ?Physical Activity: Not on file  ?Stress: Not on file  ?Social Connections: Not on file  ? ?Family History  ?Problem Relation Age of Onset  ? Cancer Mother   ?     colon  ? Epilepsy Mother   ? Cancer Father   ?     prostate  ? Diabetes Father   ? Hypertension Father   ? Hypertension Maternal Aunt   ? Diabetes Maternal Aunt   ? Hypertension Paternal Aunt   ? ?- negative except otherwise stated in the family history section ?Allergies  ?Allergen Reactions  ? Lisinopril Anaphylaxis  ?  Angioedema  ? Bee Venom Swelling and Other (See Comments)  ?  Swelling at the site  ? Bupropion Swelling  ? Latex Itching and Rash  ? Propoxyphene Hives  ? Methadone Other (See Comments)  ?  unknown  ? Codeine Nausea Only  ? Meloxicam Other (See Comments)  ?  Insomnia, constipation  ? Morphine And Related Rash  ? Tegaderm Ag Mesh [Silver] Hives, Itching and Rash  ? Tomato Rash  ? ?Prior to Admission medications   ?Medication Sig Start Date End Date Taking? Authorizing Provider  ?acetaminophen (TYLENOL) 500 MG tablet Take 1,000 mg by mouth every 6 (six) hours as needed for moderate pain or headache.   Yes [provider]  ?albuterol (VENTOLIN HFA) 108 (90 Base) MCG/ACT inhaler Inhale 1-2 puffs into the lungs every 6 (six) hours as needed for wheezing or shortness of breath. 12/09/19  Yes Henderly, Britni A, PA-C  ?allopurinol (ZYLOPRIM) 100 MG tablet Take 1 tablet by mouth twice daily ?Patient  taking differently: Take 100 mg by mouth 2 (two) times daily. 10/30/21  Yes Jessy Oto, MD  ?ammonium lactate (LAC-HYDRIN) 12 % lotion Apply 1 application. topically 2 (two) times daily as needed for dry skin. 07/17/21  Yes [provider]  ?ascorbic Acid (VITAMIN C) 500 MG CPCR Take 500 mg by mouth daily. Gummy   Yes [provider]  ?aspirin EC 81 MG EC tablet Take 1 tablet (81 mg total) by mouth daily. Swallow whole. 11/19/21  Yes Magnant, Gerrianne Scale, PA-C  ?  buPROPion (WELLBUTRIN XL) 300 MG 24 hr tablet Take 300 mg by mouth daily. 06/29/20  Yes [provider]  ?cetirizine (ZYRTEC) 10 MG tablet Take 10 mg by mouth daily.   Yes [provider]  ?clotrimazole-betamethasone (LOTRISONE) cream Apply 1 application topically daily as needed (irritation). 10/25/19  Yes [provider]  ?cyclobenzaprine (FLEXERIL) 10 MG tablet Take 10-20 mg by mouth See admin instructions. 10 mg in the morning  ?20 mg at bedtime 12/15/19  Yes [provider]  ?cycloSPORINE (RESTASIS) 0.05 % ophthalmic emulsion Place 1 drop into both eyes 2 (two) times daily as needed (dry eyes).   Yes [provider]  ?DULoxetine (CYMBALTA) 60 MG capsule Take 60 mg by mouth 2 (two) times daily. 05/02/21  Yes [provider]  ?Ferrous Gluconate 324 (37.5 Fe) MG TABS Take 324 mg by mouth daily. 02/14/20  Yes [provider]  ?furosemide (LASIX) 20 MG tablet Take 20 mg by mouth daily.   Yes [provider]  ?gabapentin (NEURONTIN) 400 MG capsule Take 400 mg by mouth 2 (two) times daily.   Yes [provider]  ?hydrochlorothiazide (HYDRODIURIL) 25 MG tablet Take 25 mg by mouth daily. 09/15/21  Yes [provider]  ?levothyroxine (SYNTHROID) 150 MCG tablet Take 150 mcg by mouth daily before breakfast. 05/18/19  Yes [provider]  ?NARCAN 4 MG/0.1ML LIQD nasal spray kit Place 0.4 mg into the nose once as needed (opiate overdose). 06/28/17  Yes  [provider]  ?oxybutynin (DITROPAN XL) 15 MG 24 hr tablet Take 15 mg by mouth daily.  05/31/17  Yes [provider]  ?oxyCODONE-acetaminophen (PERCOCET) 10-325 MG tablet Take 1 tablet by mouth eve

## 2021-12-18 NOTE — ED Notes (Signed)
Pt up to bed side commode; pt given cup of water and ice packs upon request; pt encouraged to call for nurse when back in bed for pain meds ?

## 2021-12-18 NOTE — Progress Notes (Signed)
?PROGRESS NOTE ? ? ? ?Marilyn [name]  WUJ:[id number] DOB: 04-26-1971 DOA: 12/17/2021 ?PCP: Rebecka ApleyHemberg, Katherine V, NP  ? ? ?Brief Narrative:  ?[age] year old female with history of morbid obesity, obstructive sleep apnea on CPAP, anxiety/depression/GERD, hypothyroidism and hypertension, left reverse shoulder arthroplasty on 3/2 with subsequent I&D of superficial infection 3/17, repeat I&D and wound closure 3/22 admitted with worsening left shoulder pain and drainage.  Hemodynamically stable.  Painful.  Admitted with postop infection.  Started on vancomycin and Zosyn. ? ? ?Assessment & Plan: ?  ?Postop wound infection with shoulder arthroplasty: ?Blood cultures drawn.  Started on vancomycin and Zosyn.  Scheduled for debridement on 4/5, possible explantation/7.  Surgery following.  Adequate pain medications. ? ?Sleep apnea: Uses CPAP at naps and at night. ?Patient complained of recently worsening shortness of breath especially after surgery.  D-dimer is elevated.  We will check CTA of the chest.  We will also check lower extremity duplexes. ? ?Chronic intermittent asthma: Stable.  Albuterol as needed. ? ?Hypothyroidism: Euvolemic on Synthroid. ? ?Essential hypertension: Blood pressure stable on hydrochlorothiazide. ? ?Hypokalemia/hypomagnesemia: Replaced today. ? ? ?DVT prophylaxis:   start Lovenox. ? ? ?Code Status: Full code ?Family Communication: None ?Disposition Plan: Status is: Inpatient ?Remains inpatient appropriate because: Inpatient surgery planned ?  ? ? ?Consultants:  ?Orthopedics ? ?Procedures:  ?None ? ?Antimicrobials:  ?Vancomycin Zosyn 4/2--- ? ? ?Subjective: ?Patient seen and examined.  On my initial evaluation, she continued to have nausea, she was shaky and not talking.  Give her nausea medication, went back to examine her she was complaining of pain.  Did not wear CPAP overnight.  Just not feeling well.  On room air. ? ?Objective: ?Vitals:  ? 12/18/21 0850 12/18/21 1008 12/18/21 1123 12/18/21 1148  ?BP:  (!) 154/73 (!) 154/73  121/68  ?Pulse: 94 71 81 80  ?Resp: 20 18 (!) 21 14  ?Temp: 98.3 ?F (36.8 ?C)   98.6 ?F (37 ?C)  ?TempSrc: Axillary   Axillary  ?SpO2: 100% 100% 100% 97%  ?Weight:      ?Height:      ? ? ?Intake/Output Summary (Last 24 hours) at 12/18/2021 1235 ?Last data filed at 12/18/2021 1021 ?Gross per 24 hour  ?Intake 1000 ml  ?Output --  ?Net 1000 ml  ? ?Filed Weights  ? 12/18/21 0500 12/18/21 0607  ?Weight: 124 kg (!) 169.3 kg  ? ? ?Examination: ? ?General exam: Appears uncomfortable.  Anxious and in moderate distress. ?Shaky and tremulous. ?Respiratory system: Clear to auscultation. Respiratory effort normal.  On room air. ?Cardiovascular system: S1 & S2 heard, RRR.  Edematous legs. ?Gastrointestinal system: Abdomen is nondistended, soft and nontender. No organomegaly or masses felt. Normal bowel sounds heard.  Pendulous.  Nontender. ?Central nervous system: Shaky.  Anxious.  Tremulous.  No focal deficit. ?Left shoulder with purulent drainage, dressing applied.  Tender all over. ? ? ? ? ?Data Reviewed: I have personally reviewed following labs and imaging studies ? ?CBC: ?Recent Labs  ?Lab 12/17/21 ?2149  ?WBC 6.3  ?NEUTROABS 3.7  ?HGB 9.7*  ?HCT 32.4*  ?MCV 95.3  ?PLT 300  ? ?Basic Metabolic Panel: ?Recent Labs  ?Lab 12/17/21 ?2214 12/18/21 ?[id number]  ?NA 138  --   ?K 3.4*  --   ?CL 102  --   ?CO2 22  --   ?GLUCOSE 111*  --   ?BUN 8  --   ?CREATININE 1.32*  --   ?CALCIUM 9.3  --   ?MG  --  1.5*  ? ?  GFR: ?Estimated Creatinine Clearance: 78.1 mL/min (A) (by C-G formula based on SCr of 1.32 mg/dL (H)). ?Liver Function Tests: ?No results for input(s): AST, ALT, ALKPHOS, BILITOT, PROT, ALBUMIN in the last 168 hours. ?No results for input(s): LIPASE, AMYLASE in the last 168 hours. ?No results for input(s): AMMONIA in the last 168 hours. ?Coagulation Profile: ?No results for input(s): INR, PROTIME in the last 168 hours. ?Cardiac Enzymes: ?No results for input(s): CKTOTAL, CKMB, CKMBINDEX, TROPONINI in the last 168  hours. ?BNP (last 3 results) ?No results for input(s): PROBNP in the last 8760 hours. ?HbA1C: ?No results for input(s): HGBA1C in the last 72 hours. ?CBG: ?No results for input(s): GLUCAP in the last 168 hours. ?Lipid Profile: ?No results for input(s): CHOL, HDL, LDLCALC, TRIG, CHOLHDL, LDLDIRECT in the last 72 hours. ?Thyroid Function Tests: ?No results for input(s): TSH, T4TOTAL, FREET4, T3FREE, THYROIDAB in the last 72 hours. ?Anemia Panel: ?No results for input(s): VITAMINB12, FOLATE, FERRITIN, TIBC, IRON, RETICCTPCT in the last 72 hours. ?Sepsis Labs: ?Recent Labs  ?Lab 12/17/21 ?2214 12/18/21 ?1125  ?LATICACIDVEN 2.7* 1.7  ? ? ?Recent Results (from the past 240 hour(s))  ?Resp Panel by RT-PCR (Flu A&B, Covid) Nasopharyngeal Swab     Status: None  ? Collection Time: 12/18/21  4:32 AM  ? Specimen: Nasopharyngeal Swab; Nasopharyngeal(NP) swabs in vial transport medium  ?Result Value Ref Range Status  ? SARS Coronavirus 2 by RT PCR NEGATIVE NEGATIVE Final  ?  Comment: (NOTE) ?SARS-CoV-2 target nucleic acids are NOT DETECTED. ? ?The SARS-CoV-2 RNA is generally detectable in upper respiratory ?specimens during the acute phase of infection. The lowest ?concentration of SARS-CoV-2 viral copies this assay can detect is ?138 copies/mL. A negative result does not preclude SARS-Cov-2 ?infection and should not be used as the sole basis for treatment or ?other patient management decisions. A negative result may occur with  ?improper specimen collection/handling, submission of specimen other ?than nasopharyngeal swab, presence of viral mutation(s) within the ?areas targeted by this assay, and inadequate number of viral ?copies(<138 copies/mL). A negative result must be combined with ?clinical observations, patient history, and epidemiological ?information. The expected result is Negative. ? ?Fact Sheet for Patients:  ?BloggerCourse.com ? ?Fact Sheet for Healthcare Providers:   ?SeriousBroker.it ? ?This test is no t yet approved or cleared by the [place] FDA and  ?has been authorized for detection and/or diagnosis of SARS-CoV-2 by ?FDA under an Emergency Use Authorization (EUA). This EUA will remain  ?in effect (meaning this test can be used) for the duration of the ?COVID-19 declaration under Section 564(b)(1) of the Act, 21 ?U.S.C.section 360bbb-3(b)(1), unless the authorization is terminated  ?or revoked sooner.  ? ? ?  ? Influenza A by PCR NEGATIVE NEGATIVE Final  ? Influenza B by PCR NEGATIVE NEGATIVE Final  ?  Comment: (NOTE) ?The Xpert Xpress SARS-CoV-2/FLU/RSV plus assay is intended as an aid ?in the diagnosis of influenza from Nasopharyngeal swab specimens and ?should not be used as a sole basis for treatment. Nasal washings and ?aspirates are unacceptable for Xpert Xpress SARS-CoV-2/FLU/RSV ?testing. ? ?Fact Sheet for Patients: ?BloggerCourse.com ? ?Fact Sheet for Healthcare Providers: ?SeriousBroker.it ? ?This test is not yet approved or cleared by the [place] FDA and ?has been authorized for detection and/or diagnosis of SARS-CoV-2 by ?FDA under an Emergency Use Authorization (EUA). This EUA will remain ?in effect (meaning this test can be used) for the duration of the ?COVID-19 declaration under Section 564(b)(1) of the Act, 21 U.S.C. ?section 360bbb-3(b)(1),  unless the authorization is terminated or ?revoked. ? ?Performed at [facility], [address], [place], [place] ?[address] ?  ?  ? ? ? ? ? ?Radiology Studies: ?CT Shoulder Left Wo Contrast ? ?Result Date: 12/18/2021 ?CLINICAL DATA:  Osteomyelitis suspected, shoulder. EXAM: CT OF THE UPPER LEFT EXTREMITY WITHOUT CONTRAST TECHNIQUE: Multidetector CT imaging of the upper left extremity was performed according to the standard protocol. RADIATION DOSE REDUCTION: This exam was performed according to the departmental dose-optimization  program which includes automated exposure control, adjustment of the mA and/or kV according to patient size and/or use of iterative reconstruction technique. COMPARISON:  04/18/2022. FINDINGS: Bones/Joint/Cartilage No acute fr

## 2021-12-18 NOTE — ED Notes (Signed)
Hospitalist in with pt at this time  

## 2021-12-18 NOTE — Assessment & Plan Note (Signed)
Hypoxia ?Patient was not hypoxic on arrival to the ED but desatted to 84% on room air after she received pain medications.  Currently satting 100% on 2 L supplemental oxygen.  Lungs clear on exam and no signs of respiratory distress.  Daughter is concerned that patient has had worsening dyspnea on exertion since after her surgery. ?-Stat chest x-ray ?-Stat EKG ?-Stat troponin ?-Given recent surgeries and severe obesity, PE is on the differential but she is not tachycardic.  Stat D-dimer, if elevated, CTA to rule out PE. ?

## 2021-12-18 NOTE — ED Notes (Signed)
Pt placed on 3L O2 via [place] due to O2 sats decreasing to mid 80's; once applied sats increased to 92-95% ?

## 2021-12-18 NOTE — Assessment & Plan Note (Signed)
Stable, not wheezing. ?-Continue albuterol as needed ?

## 2021-12-18 NOTE — Progress Notes (Signed)
Pt was placed on CPAP auto due to difficulty breathing. Pt is currently stable, RT will continue to monitor. ? ?

## 2021-12-18 NOTE — Assessment & Plan Note (Signed)
Continue allopurinol 

## 2021-12-18 NOTE — Progress Notes (Signed)
Pharmacy Antibiotic Note ? ?Marilyn [name] is a 51 y.o. female admitted on 12/17/2021 with status post left reverse shoulder arthroplasty on 11/16/2021 with subsequent I&D of superficial infection by Dr. August Saucer on 12/01/2021 and repeat I&D with wound closure by Dr. Lajoyce Corners on 12/06/2021. Now admitted with wound infection. Pharmacy has been consulted for Vancomycin and Zosyn dosing. ? ?Plan: ?Zosyn 3.375gm IV q8h ?Vancomycin 1000mg  in addition to 1000mg  already given in ED to equal 2000mg  loading dose then 1750 mg IV Q 36 hrs. Goal AUC 400-550. ?Expected AUC: 487, SCr used: 1.32 ?Will f/u renal function, micro data, and pt's clinical condition ?Vanc levels prn ? ? ?Height: 5\' 3"  (160 cm) ?Weight: 124 kg (273 lb 5.9 oz) ?IBW/kg (Calculated) : 52.4 ? ?Temp (24hrs), Avg:98.9 ?F (37.2 ?C), Min:98.9 ?F (37.2 ?C), Max:98.9 ?F (37.2 ?C) ? ?Recent Labs  ?Lab 12/17/21 ?2149 12/17/21 ?2214  ?WBC 6.3  --   ?CREATININE  --  1.32*  ?LATICACIDVEN  --  2.7*  ?  ?Estimated Creatinine Clearance: 63.8 mL/min (A) (by C-G formula based on SCr of 1.32 mg/dL (H)).   ? ?Allergies  ?Allergen Reactions  ? Lisinopril Anaphylaxis  ?  Angioedema  ? Bee Venom Swelling and Other (See Comments)  ?  Swelling at the site  ? Bupropion Swelling  ? Latex Itching and Rash  ? Propoxyphene Hives  ? Methadone Other (See Comments)  ?  unknown  ? Codeine Nausea Only  ? Meloxicam Other (See Comments)  ?  Insomnia, constipation  ? Morphine And Related Rash  ? Tegaderm Ag Mesh [Silver] Hives, Itching and Rash  ? Tomato Rash  ? ? ?Antimicrobials this admission: ?4/3 Zosyn >>  ?4/3 Vanc >>  ? ?Microbiology results: ?Pending ? ?Thank you for allowing pharmacy to be a part of this patient?s care. ? ?02/16/22, PharmD, BCPS ?Please see amion for complete clinical pharmacist phone list ?12/18/2021 5:16 AM ? ?

## 2021-12-18 NOTE — Progress Notes (Signed)
?  Transition of Care (TOC) Screening Note ? ? ?Patient Details  ?Name: Marilyn [name] ?Date of Birth: 1971/06/24 ? ? ?Transition of Care (TOC) CM/SW Contact:    ?Mearl Latin, LCSW ?Phone Number: ?12/18/2021, 9:29 AM ? ? ? ?Transition of Care Department [facility]) has reviewed patient and no TOC needs have been identified at this time. We will continue to monitor patient advancement through interdisciplinary progression rounds. If new patient transition needs arise, please place a TOC consult. ? ? ?

## 2021-12-18 NOTE — ED Notes (Signed)
ED TO INPATIENT HANDOFF REPORT ? ?ED Nurse Name and Phone #:  Neldon Mc RN ? ?S ?Name/Age/Gender ?Marilyn [name] ?51 y.o. ?female ?Room/Bed: 027C/027C ? ?Code Status ?  Code Status: Full Code ? ?Home/SNF/Other ?Home ?Patient oriented to: self, place, time, and situation ?Is this baseline? Yes  ? ?Triage Complete: Triage complete  ?Chief Complaint ?Shoulder abscess [L02.419] ? ?Triage Note ?Pt reported to ED with c/o severe pain to let shoulder following should replacement procedure on 11/16/2021. Pt states that site was infected and she spent time in hospital to treat infection. Pt reports flowing up with ortho on Friday. States her pain medication is not working so she no longer takes medication.   ? ?Allergies ?Allergies  ?Allergen Reactions  ? Lisinopril Anaphylaxis  ?  Angioedema  ? Bee Venom Swelling and Other (See Comments)  ?  Swelling at the site  ? Bupropion Swelling  ? Latex Itching and Rash  ? Propoxyphene Hives  ? Methadone Other (See Comments)  ?  unknown  ? Codeine Nausea Only  ? Meloxicam Other (See Comments)  ?  Insomnia, constipation  ? Morphine And Related Rash  ? Tegaderm Ag Mesh [Silver] Hives, Itching and Rash  ? Tomato Rash  ? ? ?Level of Care/Admitting Diagnosis ?ED Disposition   ? ? ED Disposition  ?Admit  ? Condition  ?--  ? Comment  ?Hospital Area: [facility] [[id number]] ? Level of Care: Telemetry Medical [104] ? May admit patient to [facility] or [facility] if equivalent level of care is available:: Yes ? Covid Evaluation: Asymptomatic - no recent exposure (last 10 days) testing not required ? Diagnosis: Shoulder abscess [708510] ? Admitting Physician: John Giovanni [[id number]] ? Attending Physician: John Giovanni [[id number]] ? Estimated length of stay: past midnight tomorrow ? Certification:: I certify this patient will need inpatient services for at least 2 midnights ?  ?  ? ?  ? ? ?B ?Medical/Surgery History ?Past Medical History:  ?Diagnosis Date  ? Anemia   ?  taking iron now. pt states having no current issues 09/02/2015  ? Anginal pain (HCC)   ? pt states experiences chest wall pain pt states related to her asthma   ? Anxiety   ? with MRI's  ? Arthritis   ? Everywhere  ? Asthma   ? Depression   ? Dizziness   ? GERD (gastroesophageal reflux disease)   ? Headache(784.0)   ? HX OF MIGRAINES  ? History of bronchitis   ? Hypertension   ? Hypothyroidism   ? takes levothyroxen  ? OSA on CPAP   ? wears cpap  ? Shortness of breath   ? with exertion  ? Wears glasses   ? ?Past Surgical History:  ?Procedure Laterality Date  ? APPLICATION OF WOUND VAC  11/16/2021  ? Procedure: APPLICATION OF WOUND VAC;  Surgeon: Cammy Copa, MD;  Location: [facility] OR;  Service: Orthopedics;;  ? APPLICATION OF WOUND VAC Left 12/01/2021  ? Procedure: APPLICATION OF WOUND VAC;  Surgeon: Cammy Copa, MD;  Location: [facility] OR;  Service: Orthopedics;  Laterality: Left;  ? BACK SURGERY    ? CESAREAN SECTION    ? times 2  ? CHOLECYSTECTOMY    ? COLONOSCOPY  2020  ? ENDOMETRIAL ABLATION    ? I & D EXTREMITY Left 12/06/2021  ? Procedure: LEFT SHOULDER DEBRIDEMENT AND WOUND CLOSURE;  Surgeon: [name], MD;  Location: [facility] OR;  Service: Orthopedics;  Laterality: Left;  ?  INCISION AND DRAINAGE Left 12/01/2021  ? Procedure: LEFT SHOULDER IRRIGATION AND EXCISIONAL DEBRIDEMENT;  Surgeon: Cammy Copa, MD;  Location: [facility] OR;  Service: Orthopedics;  Laterality: Left;  ? INCISION AND DRAINAGE HIP Left 11/10/2015  ? Procedure: IRRIGATION AND DEBRIDEMENT LEFT HIP INCISION;  Surgeon: Kathryne Hitch, MD;  Location: [facility] OR;  Service: Orthopedics;  Laterality: Left;  ? JOINT REPLACEMENT  2011  ? total left knee  ? KNEE ARTHROPLASTY  04/23/2012  ? right   ? KNEE ARTHROSCOPY    ? LUMBAR LAMINECTOMY/DECOMPRESSION MICRODISCECTOMY N/A 06/23/2019  ? Procedure: RIGHT LUMBAR FIVE THROUGH SACRAL ONE PARTIAL HEMILAMINECTOMY WITH RIGHT LUMBAR FIVE FORAMINOTOMY;  Surgeon: Kerrin Champagne, MD;  Location: [facility] OR;   Service: Orthopedics;  Laterality: N/A;  ? LUMBAR WOUND DEBRIDEMENT N/A 06/11/2018  ? Procedure: LUMBAR WOUND DEBRIDEMENT;  Surgeon: Kerrin Champagne, MD;  Location: [facility] OR;  Service: Orthopedics;  Laterality: N/A;  ? RADIOLOGY WITH ANESTHESIA N/A 09/09/2018  ? Procedure: LUMBER SPINE WITHOUT CONTRAST;  Surgeon: Radiologist, Medication, MD;  Location: [facility] OR;  Service: Radiology;  Laterality: N/A;  ? REVERSE SHOULDER ARTHROPLASTY Left 11/16/2021  ? Procedure: LEFT REVERSE SHOULDER ARTHROPLASTY;  Surgeon: Cammy Copa, MD;  Location: [facility] OR;  Service: Orthopedics;  Laterality: Left;  ? ROTATOR CUFF REPAIR    ? left   ? SHOULDER SURGERY    ? right to repair ligament tear  ? TOTAL HIP ARTHROPLASTY Left 09/09/2015  ? Procedure: LEFT TOTAL HIP ARTHROPLASTY ANTERIOR APPROACH;  Surgeon: Kathryne Hitch, MD;  Location: [facility] ORS;  Service: Orthopedics;  Laterality: Left;  ? TOTAL KNEE ARTHROPLASTY  04/23/2012  ? Procedure: TOTAL KNEE ARTHROPLASTY;  Surgeon: [name], MD;  Location: [facility] OR;  Service: Orthopedics;  Laterality: Right;  Right Total Knee Arthroplasty  ? TUBAL LIGATION  1996  ?  ? ?A ?IV Location/Drains/Wounds ?Patient Lines/Drains/Airways Status   ? ? Active Line/Drains/Airways   ? ? Name Placement date Placement time Site Days  ? Peripheral IV 12/17/21 22 G Anterior;Right Hand 12/17/21  2305  Hand  1  ? Negative Pressure Wound Therapy Vertebral column 06/11/18  2200  --  1286  ? Negative Pressure Wound Therapy Back Lower 06/23/19  1012  --  909  ? Negative Pressure Wound Therapy Arm Anterior;Left;Upper 12/06/21  1215  --  12  ? Incision (Closed) 05/27/18 Back 05/27/18  1620  -- 1301  ? Incision (Closed) 06/11/18 Back Other (Comment) 06/11/18  2246  -- 1286  ? Incision (Closed) 06/23/19 Back Other (Comment) 06/23/19  1022  -- 909  ? Incision (Closed) 12/01/21 Shoulder Left 12/01/21  1824  -- 17  ? Incision (Closed) 12/06/21 Shoulder Right 12/06/21  1224  -- 12  ? ?  ?  ? ?  ? ? ?Intake/Output Last 24  hours ? ?Intake/Output Summary (Last 24 hours) at 12/18/2021 0445 ?Last data filed at 12/18/2021 0309 ?Gross per 24 hour  ?Intake 1000 ml  ?Output --  ?Net 1000 ml  ? ? ?Labs/Imaging ?Results for orders placed or performed during the hospital encounter of 12/17/21 (from the past 48 hour(s))  ?CBC with Differential     Status: Abnormal  ? Collection Time: 12/17/21  9:49 PM  ?Result Value Ref Range  ? WBC 6.3 4.0 - 10.5 K/uL  ? RBC 3.40 (L) 3.87 - 5.11 MIL/uL  ? Hemoglobin 9.7 (L) 12.0 - 15.0 g/dL  ? HCT 32.4 (L) 36.0 - 46.0 %  ? MCV 95.3 80.0 - 100.0  fL  ? MCH 28.5 26.0 - 34.0 pg  ? MCHC 29.9 (L) 30.0 - 36.0 g/dL  ? RDW 15.1 11.5 - 15.5 %  ? Platelets 300 150 - 400 K/uL  ? nRBC 0.3 (H) 0.0 - 0.2 %  ? Neutrophils Relative % 59 %  ? Neutro Abs 3.7 1.7 - 7.7 K/uL  ? Lymphocytes Relative 31 %  ? Lymphs Abs 2.0 0.7 - 4.0 K/uL  ? Monocytes Relative 5 %  ? Monocytes Absolute 0.3 0.1 - 1.0 K/uL  ? Eosinophils Relative 4 %  ? Eosinophils Absolute 0.3 0.0 - 0.5 K/uL  ? Basophils Relative 0 %  ? Basophils Absolute 0.0 0.0 - 0.1 K/uL  ? Immature Granulocytes 1 %  ? Abs Immature Granulocytes 0.03 0.00 - 0.07 K/uL  ?  Comment: Performed at [facility], [address] [place], [place] [address]  ?Basic metabolic panel     Status: Abnormal  ? Collection Time: 04/02/23[facility] 10:14 PM  ?Result Value Ref Range  ? Sodium 138 135 - 145 mmol/L  ? Potassium 3.4 (L) 3.5 - 5.1 mmol/L  ? Chloride 102 98 - 111 mmol/L  ? CO2 22 22 - 32 mmol/L  ? Glucose, Bld 111 (H) 70 - 99 mg/dL  ?  Comment: Glucose reference range applies only to samples taken after fasting for at least 8 hours.  ? BUN 8 6 - 20 mg/dL  ? Creatinine, Ser 1.32 (H) 0.44 - 1.00 mg/dL  ? Calcium 9.3 8.9 - 10.3 mg/dL  ? GFR, Estimated 49 (L) >60 mL/min  ?  Comment: (NOTE) ?Calculated using the CKD-EPI Creatinine Equation (2021) ?  ? Anion gap 14 5 - 15  ?  Comment: Performed at [facility], [address] [place], [place] [address]  ?C-reactive protein     Status: Abnormal  ?  Collection Time: 12/17/21 10:14 PM  ?Result Value Ref Range  ? CRP 5.2 (H) <1.0 mg/dL  ?  Comment: Performed at [facility], [address] [place], [place] [address]  ?Lactic acid, plasma     Status:

## 2021-12-18 NOTE — Plan of Care (Signed)

## 2021-12-18 NOTE — Assessment & Plan Note (Signed)
Mild.  No fever or leukocytosis.  Patient was given 1 L IV fluid bolus in the ED. ?-Repeat lactate ?

## 2021-12-18 NOTE — Discharge Instructions (Addendum)
It was a pleasure caring for you today in the emergency department. ? ?Please take your analgesics as prescribed.  Please follow-up with orthopedics tomorrow in the office. ? ?Please return to the emergency department for any worsening or worrisome symptoms. ? ?

## 2021-12-18 NOTE — Assessment & Plan Note (Signed)
-  Continue nightly CPAP °

## 2021-12-18 NOTE — H&P (Signed)
?History and Physical  ? ? ?[name] QJF:[id number] DOB: 1970/09/26 DOA: 12/17/2021 ? ?PCP: Bridget Hartshorn, NP ? ?Patient coming from: Home ? ?Chief Complaint: Left shoulder pain ? ?HPI: Marilyn [name] is a 51 y.o. female with medical history significant of anemia, anxiety, depression, arthritis, asthma, GERD, migraines, hypertension, hypothyroidism, OSA on CPAP, history of left shoulder osteoarthritis status post left reverse shoulder arthroplasty on 11/16/2021 with subsequent I&D of superficial infection by Dr. Marlou Sa on 12/01/2021 and repeat I&D with wound closure by Dr. Sharol Given on 12/06/2021.  Patient was seen by Dr. Sharol Given on 3/30 and the wound was felt to be improving with healthy granulation tissue.  Seen again at orthopedics office on 3/31 and the wound was felt to be stable.  She called orthopedics office yesterday and complained of increasing postop shoulder pain without new injury and was sent to the ED for further evaluation.  No fever or leukocytosis.  Hemoglobin 9.7, stable.  Potassium 3.4.  Creatinine 1.3, stable.  Lactic acid 2.7.  CRP 5.2.  ESR pending.  X-ray of left shoulder negative for acute bony abnormality.  CT of left shoulder showing complex collection containing air in the subcutaneous fat anterior to the left shoulder concerning for abscess; no definite evidence of hardware loosening or osteomyelitis although examination limited due to hardware artifact. Patient was given Dilaudid, Zofran, Percocet, and 1 L normal saline bolus.  ED physician spoke to Dr. Sharol Given, recommended starting vancomycin and Zosyn.  Orthopedics will consult in the morning. ? ?Patient reports ongoing pain in her left shoulder since after her recent surgeries but is concerned that the pain has been excruciating for the past 1 day.  States she was taking antibiotics since after her second surgery, finished 2 days ago.  She does not remember the name of the antibiotics.  States when she went to orthopedics office they  were able to express a lot of drainage out of hole in her wound.  She is not able to move her arm due to severe pain.  Denies fevers.  Daughter is concerned that the patient is having dyspnea on minimizing exertion since after her surgery.  She is concerned that the patient's oxygen saturation dropped to the low 80s in the ED.  Patient denies chest pain. ? ?Review of Systems:  ?Review of Systems  ?All other systems reviewed and are negative. ? ?Past Medical History:  ?Diagnosis Date  ? Anemia   ? taking iron now. pt states having no current issues 09/02/2015  ? Anginal pain ([place])   ? pt states experiences chest wall pain pt states related to her asthma   ? Anxiety   ? with MRI's  ? Arthritis   ? Everywhere  ? Asthma   ? Depression   ? Dizziness   ? GERD (gastroesophageal reflux disease)   ? Headache(784.0)   ? HX OF MIGRAINES  ? History of bronchitis   ? Hypertension   ? Hypothyroidism   ? takes levothyroxen  ? OSA on CPAP   ? wears cpap  ? Shortness of breath   ? with exertion  ? Wears glasses   ? ? ?Past Surgical History:  ?Procedure Laterality Date  ? APPLICATION OF WOUND VAC  11/16/2021  ? Procedure: APPLICATION OF WOUND VAC;  Surgeon: Meredith Pel, MD;  Location: [place];  Service: Orthopedics;;  ? APPLICATION OF WOUND VAC Left 12/01/2021  ? Procedure: APPLICATION OF WOUND VAC;  Surgeon: Meredith Pel, MD;  Location: [place];  Service: Orthopedics;  Laterality: Left;  ? BACK SURGERY    ? CESAREAN SECTION    ? times 2  ? CHOLECYSTECTOMY    ? COLONOSCOPY  2020  ? ENDOMETRIAL ABLATION    ? I & D EXTREMITY Left 12/06/2021  ? Procedure: LEFT SHOULDER DEBRIDEMENT AND WOUND CLOSURE;  Surgeon: Duda, Marcus V, MD;  Location: [facility] OR;  Service: Orthopedics;  Laterality: Left;  ? INCISION AND DRAINAGE Left 12/01/2021  ? Procedure: LEFT SHOULDER IRRIGATION AND EXCISIONAL DEBRIDEMENT;  Surgeon: Dean, Gregory Scott, MD;  Location: [facility] OR;  Service: Orthopedics;  Laterality: Left;  ? INCISION AND DRAINAGE HIP Left 11/10/2015   ? Procedure: IRRIGATION AND DEBRIDEMENT LEFT HIP INCISION;  Surgeon: Christopher Y Blackman, MD;  Location: [facility] OR;  Service: Orthopedics;  Laterality: Left;  ? JOINT REPLACEMENT  2011  ? total left knee  ? KNEE ARTHROPLASTY  04/23/2012  ? right   ? KNEE ARTHROSCOPY    ? LUMBAR LAMINECTOMY/DECOMPRESSION MICRODISCECTOMY N/A 06/23/2019  ? Procedure: RIGHT LUMBAR FIVE THROUGH SACRAL ONE PARTIAL HEMILAMINECTOMY WITH RIGHT LUMBAR FIVE FORAMINOTOMY;  Surgeon: Nitka, James E, MD;  Location: [facility] OR;  Service: Orthopedics;  Laterality: N/A;  ? LUMBAR WOUND DEBRIDEMENT N/A 06/11/2018  ? Procedure: LUMBAR WOUND DEBRIDEMENT;  Surgeon: Nitka, James E, MD;  Location: [facility] OR;  Service: Orthopedics;  Laterality: N/A;  ? RADIOLOGY WITH ANESTHESIA N/A 09/09/2018  ? Procedure: LUMBER SPINE WITHOUT CONTRAST;  Surgeon: Radiologist, Medication, MD;  Location: [facility] OR;  Service: Radiology;  Laterality: N/A;  ? REVERSE SHOULDER ARTHROPLASTY Left 11/16/2021  ? Procedure: LEFT REVERSE SHOULDER ARTHROPLASTY;  Surgeon: Dean, Gregory Scott, MD;  Location: [facility] OR;  Service: Orthopedics;  Laterality: Left;  ? ROTATOR CUFF REPAIR    ? left   ? SHOULDER SURGERY    ? right to repair ligament tear  ? TOTAL HIP ARTHROPLASTY Left 09/09/2015  ? Procedure: LEFT TOTAL HIP ARTHROPLASTY ANTERIOR APPROACH;  Surgeon: Christopher Y Blackman, MD;  Location: [facility];  Service: Orthopedics;  Laterality: Left;  ? TOTAL KNEE ARTHROPLASTY  04/23/2012  ? Procedure: TOTAL KNEE ARTHROPLASTY;  Surgeon: Marcus V Duda, MD;  Location: [facility] OR;  Service: Orthopedics;  Laterality: Right;  Right Total Knee Arthroplasty  ? TUBAL LIGATION  1996  ? ? ? reports that she quit smoking about 34 years ago. Her smoking use included cigarettes. She has a 2.00 pack-year smoking history. She has never used smokeless tobacco. She reports that she does not drink alcohol and does not use drugs. ? ?Allergies  ?Allergen Reactions  ? Lisinopril Anaphylaxis  ?  Angioedema  ? Bee Venom Swelling and Other  (See Comments)  ?  Swelling at the site  ? Bupropion Swelling  ? Latex Itching and Rash  ? Propoxyphene Hives  ? Methadone Other (See Comments)  ?  unknown  ? Codeine Nausea Only  ? Meloxicam Other (See Comments)  ?  Insomnia, constipation  ? Morphine And Related Rash  ? Tegaderm Ag Mesh [Silver] Hives, Itching and Rash  ? Tomato Rash  ? ? ?Family History  ?Problem Relation Age of Onset  ? Cancer Mother   ?     colon  ? Epilepsy Mother   ? Cancer Father   ?     prostate  ? Diabetes Father   ? Hypertension Father   ? Hypertension Maternal Aunt   ? Diabetes Maternal Aunt   ? Hypertension Paternal Aunt   ? ? ?Prior to Admission medications   ?Medication Sig Start   Date End Date Taking? Authorizing Provider  ?acetaminophen (TYLENOL) 500 MG tablet Take 1,000 mg by mouth every 6 (six) hours as needed for moderate pain or headache.   Yes [provider]  ?albuterol (VENTOLIN HFA) 108 (90 Base) MCG/ACT inhaler Inhale 1-2 puffs into the lungs every 6 (six) hours as needed for wheezing or shortness of breath. 12/09/19  Yes Henderly, Britni A, PA-C  ?allopurinol (ZYLOPRIM) 100 MG tablet Take 1 tablet by mouth twice daily ?Patient taking differently: Take 100 mg by mouth 2 (two) times daily. 10/30/21  Yes Jessy Oto, MD  ?ammonium lactate (LAC-HYDRIN) 12 % lotion Apply 1 application. topically 2 (two) times daily as needed for dry skin. 07/17/21  Yes [provider]  ?ascorbic Acid (VITAMIN C) 500 MG CPCR Take 500 mg by mouth daily. Gummy   Yes [provider]  ?aspirin EC 81 MG EC tablet Take 1 tablet (81 mg total) by mouth daily. Swallow whole. 11/19/21  Yes Magnant, Charles L, PA-C  ?buPROPion (WELLBUTRIN XL) 300 MG 24 hr tablet Take 300 mg by mouth daily. 06/29/20  Yes [provider]  ?cetirizine (ZYRTEC) 10 MG tablet Take 10 mg by mouth daily.   Yes [provider]  ?clotrimazole-betamethasone (LOTRISONE) cream Apply 1 application topically daily as needed (irritation). 10/25/19   Yes [provider]  ?cyclobenzaprine (FLEXERIL) 10 MG tablet Take 10-20 mg by mouth See admin instructions. 10 mg in the morning  ?20 mg at bedtime 12/15/19  Yes [provider]  ?c

## 2021-12-18 NOTE — Assessment & Plan Note (Signed)
Continue hydrochlorothiazide. 

## 2021-12-18 NOTE — Assessment & Plan Note (Addendum)
Status post left reverse shoulder arthroplasty on 11/16/2021 with subsequent I&D of superficial infection by Dr. Marlou Sa on 12/01/2021 and repeat I&D with wound closure by Dr. Sharol Given on 12/06/2021.  Patient presenting with complaint of severe left shoulder pain.  No fever or leukocytosis.  CRP 5.2.  ESR pending. CT showing complex collection containing air in the subcutaneous fat anterior to the left shoulder concerning for abscess; no definite evidence of hardware loosening or osteomyelitis although examination limited due to hardware artifact.  ?-Orthopedics consulted ?-Continue vancomycin and Zosyn ?-Continue home gabapentin, Dilaudid as needed for severe pain ?-Keep n.p.o. ?-Hold home aspirin ?

## 2021-12-19 ENCOUNTER — Inpatient Hospital Stay: Payer: Medicare Other | Admitting: Internal Medicine

## 2021-12-19 ENCOUNTER — Inpatient Hospital Stay (HOSPITAL_COMMUNITY): Payer: Medicare Other

## 2021-12-19 DIAGNOSIS — T8149XA Infection following a procedure, other surgical site, initial encounter: Secondary | ICD-10-CM | POA: Diagnosis not present

## 2021-12-19 LAB — BASIC METABOLIC PANEL
Anion gap: 10 (ref 5–15)
BUN: 9 mg/dL (ref 6–20)
CO2: 27 mmol/L (ref 22–32)
Calcium: 8.9 mg/dL (ref 8.9–10.3)
Chloride: 101 mmol/L (ref 98–111)
Creatinine, Ser: 1.46 mg/dL — ABNORMAL HIGH (ref 0.44–1.00)
GFR, Estimated: 43 mL/min — ABNORMAL LOW (ref >60)
Glucose, Bld: 118 mg/dL — ABNORMAL HIGH (ref 70–99)
Potassium: 3.1 mmol/L — ABNORMAL LOW (ref 3.5–5.1)
Sodium: 138 mmol/L (ref 135–145)

## 2021-12-19 LAB — PHOSPHORUS: Phosphorus: 3.7 mg/dL (ref 2.5–4.6)

## 2021-12-19 LAB — CBC WITH DIFFERENTIAL/PLATELET
Abs Immature Granulocytes: 0.03 10*3/uL (ref 0.00–0.07)
Basophils Absolute: 0 10*3/uL (ref 0.0–0.1)
Basophils Relative: 0 %
Eosinophils Absolute: 0.5 10*3/uL (ref 0.0–0.5)
Eosinophils Relative: 7 %
HCT: 28 % — ABNORMAL LOW (ref 36.0–46.0)
Hemoglobin: 8.7 g/dL — ABNORMAL LOW (ref 12.0–15.0)
Immature Granulocytes: 0 %
Lymphocytes Relative: 26 %
Lymphs Abs: 1.8 10*3/uL (ref 0.7–4.0)
MCH: 28.6 pg (ref 26.0–34.0)
MCHC: 31.1 g/dL (ref 30.0–36.0)
MCV: 92.1 fL (ref 80.0–100.0)
Monocytes Absolute: 0.4 10*3/uL (ref 0.1–1.0)
Monocytes Relative: 6 %
Neutro Abs: 4.3 10*3/uL (ref 1.7–7.7)
Neutrophils Relative %: 61 %
Platelets: 247 10*3/uL (ref 150–400)
RBC: 3.04 MIL/uL — ABNORMAL LOW (ref 3.87–5.11)
RDW: 15 % (ref 11.5–15.5)
WBC: 7 10*3/uL (ref 4.0–10.5)
nRBC: 0 % (ref 0.0–0.2)

## 2021-12-19 LAB — SURGICAL PCR SCREEN
MRSA, PCR: NEGATIVE
Staphylococcus aureus: NEGATIVE

## 2021-12-19 LAB — MAGNESIUM: Magnesium: 1.6 mg/dL — ABNORMAL LOW (ref 1.7–2.4)

## 2021-12-19 MED ORDER — VITAMIN B-12 250 MCG PO TABS: 250.0000 ug | ORAL_TABLET | Freq: Every day | ORAL | Status: DC

## 2021-12-19 MED ORDER — VANCOMYCIN HCL 1500 MG/300ML IV SOLN
1500.0000 mg | INTRAVENOUS | Status: DC
  Administered 2021-12-20 – 2021-12-22 (×3): 1500 mg via INTRAVENOUS
  Filled 2021-12-19 (×3): qty 300

## 2021-12-19 MED ORDER — MAGNESIUM OXIDE -MG SUPPLEMENT 400 (240 MG) MG PO TABS
400.0000 mg | ORAL_TABLET | Freq: Two times a day (BID) | ORAL | Status: DC
  Administered 2021-12-19 – 2021-12-23 (×7): 400 mg via ORAL
  Filled 2021-12-19 (×7): qty 1

## 2021-12-19 MED ORDER — POVIDONE-IODINE 10 % EX SWAB: 2.0000 "application " | Freq: Once | CUTANEOUS | Status: DC

## 2021-12-19 MED ORDER — VANCOMYCIN HCL 2000 MG/400ML IV SOLN
2000.0000 mg | Freq: Once | INTRAVENOUS | Status: AC
  Administered 2021-12-19: 2000 mg via INTRAVENOUS
  Filled 2021-12-19: qty 400

## 2021-12-19 MED ORDER — CYCLOSPORINE 0.05 % OP EMUL: 1.0000 [drp] | Freq: Two times a day (BID) | OPHTHALMIC | Status: DC | PRN

## 2021-12-19 MED ORDER — OXYCODONE-ACETAMINOPHEN 5-325 MG PO TABS
2.0000 | ORAL_TABLET | Freq: Four times a day (QID) | ORAL | Status: DC | PRN
Start: 2021-12-19 — End: 2021-12-23
  Administered 2021-12-19 – 2021-12-22 (×8): 2 via ORAL
  Filled 2021-12-19 (×8): qty 2

## 2021-12-19 MED ORDER — ZOLPIDEM TARTRATE 5 MG PO TABS
5.0000 mg | ORAL_TABLET | Freq: Every day | ORAL | Status: DC
Start: 2021-12-19 — End: 2021-12-23
  Administered 2021-12-19 – 2021-12-22 (×4): 5 mg via ORAL
  Filled 2021-12-19 (×4): qty 1

## 2021-12-19 MED ORDER — VANCOMYCIN HCL 1750 MG/350ML IV SOLN: 1750.0000 mg | INTRAVENOUS | Status: DC

## 2021-12-19 MED ORDER — HYDROMORPHONE HCL 1 MG/ML IJ SOLN
0.5000 mg | Freq: Once | INTRAMUSCULAR | Status: AC
  Administered 2021-12-19: 0.5 mg via INTRAVENOUS
  Filled 2021-12-19: qty 0.5

## 2021-12-19 MED ORDER — MAGNESIUM SULFATE 2 GM/50ML IV SOLN
2.0000 g | Freq: Once | INTRAVENOUS | Status: AC
  Administered 2021-12-19: 2 g via INTRAVENOUS
  Filled 2021-12-19: qty 50

## 2021-12-19 MED ORDER — IOHEXOL 350 MG/ML SOLN
61.0000 mL | Freq: Once | INTRAVENOUS | Status: AC | PRN
  Administered 2021-12-19: 61 mL via INTRAVENOUS

## 2021-12-19 MED ORDER — POTASSIUM CHLORIDE CRYS ER 20 MEQ PO TBCR
40.0000 meq | EXTENDED_RELEASE_TABLET | Freq: Two times a day (BID) | ORAL | Status: AC
Start: 2021-12-19 — End: 2021-12-21
  Administered 2021-12-19 – 2021-12-20 (×3): 40 meq via ORAL
  Filled 2021-12-19 (×3): qty 2

## 2021-12-19 MED ORDER — DULOXETINE HCL 60 MG PO CPEP
60.0000 mg | ORAL_CAPSULE | Freq: Two times a day (BID) | ORAL | Status: DC
  Administered 2021-12-19 – 2021-12-23 (×8): 60 mg via ORAL
  Filled 2021-12-19 (×8): qty 1

## 2021-12-19 MED ORDER — GABAPENTIN 400 MG PO CAPS
400.0000 mg | ORAL_CAPSULE | Freq: Three times a day (TID) | ORAL | Status: DC
  Administered 2021-12-19 – 2021-12-23 (×10): 400 mg via ORAL
  Filled 2021-12-19 (×9): qty 1

## 2021-12-19 MED ORDER — MUPIROCIN 2 % EX OINT
1.0000 "application " | TOPICAL_OINTMENT | Freq: Two times a day (BID) | CUTANEOUS | Status: DC
  Administered 2021-12-19 – 2021-12-23 (×9): 1 via NASAL
  Filled 2021-12-19 (×4): qty 22

## 2021-12-19 MED ORDER — VANCOMYCIN HCL 1750 MG/350ML IV SOLN
1750.0000 mg | INTRAVENOUS | Status: DC
  Filled 2021-12-19: qty 350

## 2021-12-19 MED ORDER — CHLORHEXIDINE GLUCONATE 4 % EX LIQD
60.0000 mL | Freq: Once | CUTANEOUS | Status: AC
  Administered 2021-12-20: 4 via TOPICAL
  Filled 2021-12-19 (×3): qty 60

## 2021-12-19 MED ORDER — NYSTATIN 100000 UNIT/GM EX POWD
Freq: Three times a day (TID) | CUTANEOUS | Status: DC
  Filled 2021-12-19: qty 15

## 2021-12-19 MED ORDER — POLYVINYL ALCOHOL 1.4 % OP SOLN
1.0000 [drp] | Freq: Two times a day (BID) | OPHTHALMIC | Status: DC | PRN
  Filled 2021-12-19: qty 15

## 2021-12-19 MED ORDER — CEFAZOLIN IN SODIUM CHLORIDE 3-0.9 GM/100ML-% IV SOLN
3.0000 g | INTRAVENOUS | Status: AC
  Administered 2021-12-20: 3 g via INTRAVENOUS
  Filled 2021-12-19 (×2): qty 100

## 2021-12-19 NOTE — Progress Notes (Signed)
?PROGRESS NOTE ? ? ? ?Marilyn [name]  ZOX:[id number] DOB: Jan 18, 1971 DOA: 12/17/2021 ?PCP: Rebecka Apley, NP  ? ? ?Brief Narrative:  ?51 year old female with history of morbid obesity, obstructive sleep apnea on CPAP, anxiety/depression/GERD, hypothyroidism and hypertension, left reverse shoulder arthroplasty on 3/2 with subsequent I&D of superficial infection 3/17, repeat I&D and wound closure 3/22 admitted with worsening left shoulder pain and drainage.  Hemodynamically stable.  Painful.  Admitted with postop infection.  Started on vancomycin and Zosyn. ? ? ?Assessment & Plan: ?  ?Postop wound infection with shoulder arthroplasty: ?Blood cultures drawn.  Started on vancomycin and Zosyn.  Scheduled for debridement on 4/5 .  Surgery following.  Adequate pain medications. ?Add oral oxycodone, Flexeril.  IV and oral opiates for pain control. ? ?Sleep apnea: Uses CPAP at naps and at night. ?Patient complained of recently worsening shortness of breath especially after surgery.  D-dimer was elevated.  ?CT angiogram of the chest with no obvious segmental PE.  Lower extremity duplex is pending.  ? ?Chronic intermittent asthma: Stable.  Albuterol as needed. ? ?Hypothyroidism: Euvolemic on Synthroid. ? ?Essential hypertension: Blood pressure stable on hydrochlorothiazide. ? ?Hypokalemia/hypomagnesemia: Persistently low.  We will keep on a scheduled replacement. ? ?Anxiety/depression: Resume gabapentin, increase: Milligram 3 times a day.  Resume Cymbalta. ? ? ?DVT prophylaxis:   Lovenox subcu. ? ? ?Code Status: Full code ?Family Communication: Daughter at the bedside. ?Disposition Plan: Status is: Inpatient ?Remains inpatient appropriate because: Inpatient surgery planned ?  ? ? ?Consultants:  ?Orthopedics ? ?Procedures:  ?None ? ?Antimicrobials:  ?Vancomycin Zosyn 4/2--- ? ? ?Subjective: ?Seen and examined.  Mental status improved.  Daughter at bedside.  Complained of pain not being adequately controlled.[name] ?Patient in  the couch.  Patient's daughter in the bed.  Patient slept in the [place], she wore BiPAP last night. ? ?Objective: ?Vitals:  ? 12/19/21 0224 12/19/21 0400 12/19/21 0754 12/19/21 1135  ?BP:  (!) 117/54 128/61 116/73  ?Pulse: 74 70 75 70  ?Resp: 20 17 17 20   ?Temp:  97.9 ?F (36.6 ?C) 98.1 ?F (36.7 ?C) 97.8 ?F (36.6 ?C)  ?TempSrc:  Oral Oral Oral  ?SpO2: 99% 97% 100%   ?Weight:      ?Height:      ? ? ?Intake/Output Summary (Last 24 hours) at 12/19/2021 1259 ?Last data filed at 12/19/2021 0900 ?Gross per 24 hour  ?Intake 360 ml  ?Output --  ?Net 360 ml  ? ?Filed Weights  ? 12/18/21 0500 12/18/21 0607  ?Weight: 124 kg (!) 169.3 kg  ? ? ?Examination: ? ?General exam: Appears comfortable today. ?Respiratory system: Clear to auscultation. Respiratory effort normal.  On room air. ?Cardiovascular system: S1 & S2 heard, RRR.  Edematous legs. ?Gastrointestinal system: Abdomen is nondistended, soft and nontender. No organomegaly or masses felt. Normal bowel sounds heard.  Pendulous.  Nontender. ?Central nervous system: No focal deficit. ?Left shoulder with purulent drainage, dressing applied.  Tender all over. ? ? ? ? ?Data Reviewed: I have personally reviewed following labs and imaging studies ? ?CBC: ?Recent Labs  ?Lab 12/17/21 ?2149 12/19/21 ?0308  ?WBC 6.3 7.0  ?NEUTROABS 3.7 4.3  ?HGB 9.7* 8.7*  ?HCT 32.4* 28.0*  ?MCV 95.3 92.1  ?PLT 300 247  ? ?Basic Metabolic Panel: ?Recent Labs  ?Lab 12/17/21 ?2214 12/18/21 ?02/17/22 12/19/21 ?0308  ?NA 138  --  138  ?K 3.4*  --  3.1*  ?CL 102  --  101  ?CO2 22  --  27  ?GLUCOSE 111*  --  118*  ?BUN 8  --  9  ?CREATININE 1.32*  --  1.46*  ?CALCIUM 9.3  --  8.9  ?MG  --  1.5* 1.6*  ?PHOS  --   --  3.7  ? ?GFR: ?Estimated Creatinine Clearance: 70.6 mL/min (A) (by C-G formula based on SCr of 1.46 mg/dL (H)). ?Liver Function Tests: ?No results for input(s): AST, ALT, ALKPHOS, BILITOT, PROT, ALBUMIN in the last 168 hours. ?No results for input(s): LIPASE, AMYLASE in the last 168 hours. ?No results for  input(s): AMMONIA in the last 168 hours. ?Coagulation Profile: ?No results for input(s): INR, PROTIME in the last 168 hours. ?Cardiac Enzymes: ?No results for input(s): CKTOTAL, CKMB, CKMBINDEX, TROPONINI in the last 168 hours. ?BNP (last 3 results) ?No results for input(s): PROBNP in the last 8760 hours. ?HbA1C: ?No results for input(s): HGBA1C in the last 72 hours. ?CBG: ?No results for input(s): GLUCAP in the last 168 hours. ?Lipid Profile: ?No results for input(s): CHOL, HDL, LDLCALC, TRIG, CHOLHDL, LDLDIRECT in the last 72 hours. ?Thyroid Function Tests: ?No results for input(s): TSH, T4TOTAL, FREET4, T3FREE, THYROIDAB in the last 72 hours. ?Anemia Panel: ?No results for input(s): VITAMINB12, FOLATE, FERRITIN, TIBC, IRON, RETICCTPCT in the last 72 hours. ?Sepsis Labs: ?Recent Labs  ?Lab 12/17/21 ?2214 12/18/21 ?1125  ?LATICACIDVEN 2.7* 1.7  ? ? ?Recent Results (from the past 240 hour(s))  ?Resp Panel by RT-PCR (Flu A&B, Covid) Nasopharyngeal Swab     Status: None  ? Collection Time: 12/18/21  4:32 AM  ? Specimen: Nasopharyngeal Swab; Nasopharyngeal(NP) swabs in vial transport medium  ?Result Value Ref Range Status  ? SARS Coronavirus 2 by RT PCR NEGATIVE NEGATIVE Final  ?  Comment: (NOTE) ?SARS-CoV-2 target nucleic acids are NOT DETECTED. ? ?The SARS-CoV-2 RNA is generally detectable in upper respiratory ?specimens during the acute phase of infection. The lowest ?concentration of SARS-CoV-2 viral copies this assay can detect is ?138 copies/mL. A negative result does not preclude SARS-Cov-2 ?infection and should not be used as the sole basis for treatment or ?other patient management decisions. A negative result may occur with  ?improper specimen collection/handling, submission of specimen other ?than nasopharyngeal swab, presence of viral mutation(s) within the ?areas targeted by this assay, and inadequate number of viral ?copies(<138 copies/mL). A negative result must be combined with ?clinical observations,  patient history, and epidemiological ?information. The expected result is Negative. ? ?Fact Sheet for Patients:  ?BloggerCourse.comhttps://www.fda.gov/media/152166/download ? ?Fact Sheet for Healthcare Providers:  ?SeriousBroker.ithttps://www.fda.gov/media/152162/download ? ?This test is no t yet approved or cleared by the [place] FDA and  ?has been authorized for detection and/or diagnosis of SARS-CoV-2 by ?FDA under an Emergency Use Authorization (EUA). This EUA will remain  ?in effect (meaning this test can be used) for the duration of the ?COVID-19 declaration under Section 564(b)(1) of the Act, 21 ?U.S.C.section 360bbb-3(b)(1), unless the authorization is terminated  ?or revoked sooner.  ? ? ?  ? Influenza A by PCR NEGATIVE NEGATIVE Final  ? Influenza B by PCR NEGATIVE NEGATIVE Final  ?  Comment: (NOTE) ?The Xpert Xpress SARS-CoV-2/FLU/RSV plus assay is intended as an aid ?in the diagnosis of influenza from Nasopharyngeal swab specimens and ?should not be used as a sole basis for treatment. Nasal washings and ?aspirates are unacceptable for Xpert Xpress SARS-CoV-2/FLU/RSV ?testing. ? ?Fact Sheet for Patients: ?BloggerCourse.comhttps://www.fda.gov/media/152166/download ? ?Fact Sheet for Healthcare Providers: ?SeriousBroker.ithttps://www.fda.gov/media/152162/download ? ?This test is not yet approved or cleared by the [place] FDA and ?has been authorized for detection and/or  diagnosis of SARS-CoV-2 by ?FDA under an Emergency Use Authorization (EUA). This EUA will remain ?in effect (meaning this test can be used) for the duration of the ?COVID-19 declaration under Section 564(b)(1) of the Act, 21 U.S.C. ?section 360bbb-3(b)(1), unless the authorization is terminated or ?revoked. ? ?Performed at [facility], [address] [place], [place] ?[address] ?  ?  ? ? ? ? ? ?Radiology Studies: ?CT Angio Chest Pulmonary Embolism (PE) W or WO Contrast ? ?Result Date: 12/19/2021 ?CLINICAL DATA:  Pulmonary embolism suspected. High probability. Reverse left shoulder  arthroplasty 11/16/2021, subsequent left shoulder irrigation and excisional debridement 12/01/2021 subsequent left shoulder debridement and wound closure 12/06/2021. EXAM: CT ANGIOGRAPHY CHEST WITH CONTRAST T

## 2021-12-19 NOTE — Progress Notes (Addendum)
Pharmacy Antibiotic Note ? ?Marilyn [name] is a 51 y.o. female admitted on 12/17/2021 with status post left reverse shoulder arthroplasty on 11/16/2021 with subsequent I&D of superficial infection by Dr. August Saucer on 12/01/2021 and repeat I&D with wound closure by Dr. Lajoyce Corners on 12/06/2021. Now admitted with wound infection. Pharmacy has been consulted for Vancomycin and Zosyn dosing. ? ?Initial loading dose not completed. Will give larger dose today. SCr up a little from admit to 1.46 ? ?Plan: ?Zosyn 3.375gm IV q8h ?Vancomycin 2000 mg loading dose, ?then 1500 mg IV Q 24 hrs. Goal AUC 400-550. ?Expected AUC: 501, SCr used: 1.46 ?Will f/u renal function, micro data, and pt's clinical condition. Vanc levels prn ?Plan for I & D on 4/5 and possible explantation 4/7. ?Plan to send tissue cultures 4/5 ? ? ?Height: 5\' 3"  (160 cm) ?Weight: (!) 169.3 kg (373 lb 3.8 oz) ?IBW/kg (Calculated) : 52.4 ? ?Temp (24hrs), Avg:98.2 ?F (36.8 ?C), Min:97.8 ?F (36.6 ?C), Max:98.7 ?F (37.1 ?C) ? ?Recent Labs  ?Lab 12/17/21 ?2149 12/17/21 ?2214 12/18/21 ?1125 12/19/21 ?0308  ?WBC 6.3  --   --  7.0  ?CREATININE  --  1.32*  --  1.46*  ?LATICACIDVEN  --  2.7* 1.7  --   ? ?  ?Estimated Creatinine Clearance: 70.6 mL/min (A) (by C-G formula based on SCr of 1.46 mg/dL (H)).   ? ?Allergies  ?Allergen Reactions  ? Lisinopril Anaphylaxis  ?  Angioedema  ? Bee Venom Swelling and Other (See Comments)  ?  Swelling at the site  ? Bupropion Swelling  ? Latex Itching and Rash  ? Propoxyphene Hives  ? Methadone Other (See Comments)  ?  unknown  ? Codeine Nausea Only  ? Meloxicam Other (See Comments)  ?  Insomnia, constipation  ? Morphine And Related Rash  ? Tegaderm Ag Mesh [Silver] Hives, Itching and Rash  ? Tomato Rash  ? ? ?Antimicrobials this admission: ?4/3 Zosyn >>  ?4/3 Vanc >>  ? ?Microbiology results: ? ? ? ?Thank you for allowing 6/3 to participate in this patients care. ?[place], PharmD ?12/19/2021 11:42 AM ? ?**Pharmacist phone directory can be found  on [email or web address] listed under [facility]** ? ?

## 2021-12-19 NOTE — Plan of Care (Signed)

## 2021-12-19 NOTE — Progress Notes (Signed)
Bilateral lower extremity venous duplex completed. ?Refer to "CV Proc" under chart review to view preliminary results. ? ?12/19/2021 4:17 PM ?Eula Fried., MHA, RVT, RDCS, RDMS   ?

## 2021-12-20 ENCOUNTER — Inpatient Hospital Stay (HOSPITAL_COMMUNITY): Payer: Medicare Other | Admitting: Anesthesiology

## 2021-12-20 ENCOUNTER — Encounter (HOSPITAL_COMMUNITY): Payer: Self-pay | Admitting: Internal Medicine

## 2021-12-20 ENCOUNTER — Other Ambulatory Visit: Payer: Self-pay

## 2021-12-20 ENCOUNTER — Encounter (HOSPITAL_COMMUNITY)
Admission: EM | Disposition: A | Discharge status: Home / Self Care | Payer: Self-pay | Source: Home / Self Care | Attending: Internal Medicine

## 2021-12-20 DIAGNOSIS — F418 Other specified anxiety disorders: Secondary | ICD-10-CM

## 2021-12-20 DIAGNOSIS — I10 Essential (primary) hypertension: Secondary | ICD-10-CM

## 2021-12-20 DIAGNOSIS — T8149XA Infection following a procedure, other surgical site, initial encounter: Secondary | ICD-10-CM | POA: Diagnosis not present

## 2021-12-20 DIAGNOSIS — Z87891 Personal history of nicotine dependence: Secondary | ICD-10-CM

## 2021-12-20 DIAGNOSIS — L02414 Cutaneous abscess of left upper limb: Secondary | ICD-10-CM

## 2021-12-20 DIAGNOSIS — E039 Hypothyroidism, unspecified: Secondary | ICD-10-CM

## 2021-12-20 HISTORY — PX: I & D EXTREMITY: SHX5045

## 2021-12-20 SURGERY — IRRIGATION AND DEBRIDEMENT EXTREMITY
Anesthesia: General | Laterality: Left

## 2021-12-20 MED ORDER — ONDANSETRON HCL 4 MG PO TABS: 4.0000 mg | ORAL_TABLET | Freq: Four times a day (QID) | ORAL | Status: DC | PRN

## 2021-12-20 MED ORDER — ROCURONIUM BROMIDE 10 MG/ML (PF) SYRINGE
PREFILLED_SYRINGE | INTRAVENOUS | Status: DC | PRN
  Administered 2021-12-20: 50 mg via INTRAVENOUS

## 2021-12-20 MED ORDER — OXYCODONE HCL 5 MG PO TABS: 5.0000 mg | ORAL_TABLET | Freq: Once | ORAL | Status: DC | PRN

## 2021-12-20 MED ORDER — ONDANSETRON HCL 4 MG/2ML IJ SOLN: 4.0000 mg | Freq: Four times a day (QID) | INTRAMUSCULAR | Status: DC | PRN

## 2021-12-20 MED ORDER — FENTANYL CITRATE (PF) 100 MCG/2ML IJ SOLN
INTRAMUSCULAR | Status: AC
  Filled 2021-12-20: qty 2

## 2021-12-20 MED ORDER — FENTANYL CITRATE (PF) 250 MCG/5ML IJ SOLN
INTRAMUSCULAR | Status: AC
Start: 2021-12-20 — End: ?
  Filled 2021-12-20: qty 5

## 2021-12-20 MED ORDER — SODIUM CHLORIDE 0.9 % IR SOLN
Status: DC | PRN
  Administered 2021-12-20: 500 mL

## 2021-12-20 MED ORDER — DOCUSATE SODIUM 100 MG PO CAPS
100.0000 mg | ORAL_CAPSULE | Freq: Two times a day (BID) | ORAL | Status: DC
  Administered 2021-12-20 – 2021-12-21 (×3): 100 mg via ORAL
  Filled 2021-12-20 (×3): qty 1

## 2021-12-20 MED ORDER — PROPOFOL 10 MG/ML IV BOLUS
INTRAVENOUS | Status: AC
  Filled 2021-12-20: qty 20

## 2021-12-20 MED ORDER — FENTANYL CITRATE (PF) 100 MCG/2ML IJ SOLN: 50.0000 ug | Freq: Once | INTRAMUSCULAR | Status: AC

## 2021-12-20 MED ORDER — CHLORHEXIDINE GLUCONATE 0.12 % MT SOLN: 15.0000 mL | Freq: Once | OROMUCOSAL | Status: AC

## 2021-12-20 MED ORDER — DEXAMETHASONE SODIUM PHOSPHATE 10 MG/ML IJ SOLN
INTRAMUSCULAR | Status: DC | PRN
  Administered 2021-12-20: 10 mg via INTRAVENOUS

## 2021-12-20 MED ORDER — ONDANSETRON HCL 4 MG/2ML IJ SOLN
INTRAMUSCULAR | Status: DC | PRN
  Administered 2021-12-20: 4 mg via INTRAVENOUS

## 2021-12-20 MED ORDER — CYCLOBENZAPRINE HCL 5 MG PO TABS
20.0000 mg | ORAL_TABLET | Freq: Every day | ORAL | Status: DC
  Administered 2021-12-20 – 2021-12-22 (×3): 20 mg via ORAL
  Filled 2021-12-20 (×3): qty 4

## 2021-12-20 MED ORDER — LACTATED RINGERS IV SOLN: INTRAVENOUS | Status: DC

## 2021-12-20 MED ORDER — PHENYLEPHRINE 40 MCG/ML (10ML) SYRINGE FOR IV PUSH (FOR BLOOD PRESSURE SUPPORT)
PREFILLED_SYRINGE | INTRAVENOUS | Status: DC | PRN
  Administered 2021-12-20: 120 ug via INTRAVENOUS

## 2021-12-20 MED ORDER — POLYETHYLENE GLYCOL 3350 17 G PO PACK: 17.0000 g | PACK | Freq: Every day | ORAL | Status: DC | PRN

## 2021-12-20 MED ORDER — BISACODYL 10 MG RE SUPP: 10.0000 mg | Freq: Every day | RECTAL | Status: DC | PRN

## 2021-12-20 MED ORDER — CYCLOBENZAPRINE HCL 5 MG PO TABS
10.0000 mg | ORAL_TABLET | Freq: Every day | ORAL | Status: DC
  Administered 2021-12-21 – 2021-12-23 (×2): 10 mg via ORAL
  Filled 2021-12-20 (×2): qty 2

## 2021-12-20 MED ORDER — BUPIVACAINE LIPOSOME 1.3 % IJ SUSP
INTRAMUSCULAR | Status: DC | PRN
  Administered 2021-12-20: 10 mL via PERINEURAL

## 2021-12-20 MED ORDER — MIDAZOLAM HCL 2 MG/2ML IJ SOLN
INTRAMUSCULAR | Status: AC
  Filled 2021-12-20: qty 2

## 2021-12-20 MED ORDER — BUPIVACAINE HCL (PF) 0.5 % IJ SOLN
INTRAMUSCULAR | Status: DC | PRN
  Administered 2021-12-20: 15 mL via PERINEURAL

## 2021-12-20 MED ORDER — PROPOFOL 10 MG/ML IV BOLUS
INTRAVENOUS | Status: DC | PRN
  Administered 2021-12-20: 140 mg via INTRAVENOUS

## 2021-12-20 MED ORDER — METOCLOPRAMIDE HCL 10 MG PO TABS: 5.0000 mg | ORAL_TABLET | Freq: Three times a day (TID) | ORAL | Status: DC | PRN

## 2021-12-20 MED ORDER — ONDANSETRON HCL 4 MG/2ML IJ SOLN
INTRAMUSCULAR | Status: AC
  Filled 2021-12-20: qty 2

## 2021-12-20 MED ORDER — FENTANYL CITRATE (PF) 100 MCG/2ML IJ SOLN
25.0000 ug | INTRAMUSCULAR | Status: DC | PRN
  Administered 2021-12-20 (×2): 50 ug via INTRAVENOUS

## 2021-12-20 MED ORDER — FENTANYL CITRATE (PF) 100 MCG/2ML IJ SOLN
INTRAMUSCULAR | Status: AC
  Administered 2021-12-20: 50 ug via INTRAVENOUS
  Filled 2021-12-20: qty 2

## 2021-12-20 MED ORDER — METHOCARBAMOL 1000 MG/10ML IJ SOLN
500.0000 mg | Freq: Four times a day (QID) | INTRAVENOUS | Status: DC | PRN
  Filled 2021-12-20: qty 5

## 2021-12-20 MED ORDER — ORAL CARE MOUTH RINSE: 15.0000 mL | Freq: Once | OROMUCOSAL | Status: AC

## 2021-12-20 MED ORDER — SODIUM CHLORIDE 0.9 % IV SOLN: INTRAVENOUS | Status: DC

## 2021-12-20 MED ORDER — PHENYLEPHRINE 40 MCG/ML (10ML) SYRINGE FOR IV PUSH (FOR BLOOD PRESSURE SUPPORT)
PREFILLED_SYRINGE | INTRAVENOUS | Status: AC
  Filled 2021-12-20: qty 10

## 2021-12-20 MED ORDER — OXYCODONE HCL 5 MG/5ML PO SOLN: 5.0000 mg | Freq: Once | ORAL | Status: DC | PRN

## 2021-12-20 MED ORDER — 0.9 % SODIUM CHLORIDE (POUR BTL) OPTIME
TOPICAL | Status: DC | PRN
Start: 2021-12-20 — End: 2021-12-20
  Administered 2021-12-20: 1000 mL

## 2021-12-20 MED ORDER — MIDAZOLAM HCL 2 MG/2ML IJ SOLN
INTRAMUSCULAR | Status: AC
  Administered 2021-12-20: 1 mg via INTRAVENOUS
  Filled 2021-12-20: qty 2

## 2021-12-20 MED ORDER — DEXAMETHASONE SODIUM PHOSPHATE 10 MG/ML IJ SOLN
INTRAMUSCULAR | Status: AC
  Filled 2021-12-20: qty 1

## 2021-12-20 MED ORDER — METHOCARBAMOL 500 MG PO TABS
500.0000 mg | ORAL_TABLET | Freq: Four times a day (QID) | ORAL | Status: DC | PRN
  Administered 2021-12-21 (×2): 500 mg via ORAL
  Filled 2021-12-20 (×2): qty 1

## 2021-12-20 MED ORDER — LIDOCAINE 2% (20 MG/ML) 5 ML SYRINGE
INTRAMUSCULAR | Status: DC | PRN
  Administered 2021-12-20: 60 mg via INTRAVENOUS

## 2021-12-20 MED ORDER — LIDOCAINE 2% (20 MG/ML) 5 ML SYRINGE
INTRAMUSCULAR | Status: AC
  Filled 2021-12-20: qty 5

## 2021-12-20 MED ORDER — MIDAZOLAM HCL 2 MG/2ML IJ SOLN: 1.0000 mg | Freq: Once | INTRAMUSCULAR | Status: AC

## 2021-12-20 MED ORDER — CHLORHEXIDINE GLUCONATE 0.12 % MT SOLN
OROMUCOSAL | Status: AC
  Administered 2021-12-20: 15 mL via OROMUCOSAL
  Filled 2021-12-20: qty 15

## 2021-12-20 MED ORDER — SUGAMMADEX SODIUM 200 MG/2ML IV SOLN
INTRAVENOUS | Status: DC | PRN
  Administered 2021-12-20: 400 mg via INTRAVENOUS

## 2021-12-20 MED ORDER — METOCLOPRAMIDE HCL 5 MG/ML IJ SOLN: 5.0000 mg | Freq: Three times a day (TID) | INTRAMUSCULAR | Status: DC | PRN

## 2021-12-20 SURGICAL SUPPLY — 28 items
BAG COUNTER SPONGE SURGICOUNT (BAG) IMPLANT
BLADE SURG 21 STRL SS (BLADE) ×2 IMPLANT
BNDG COHESIVE 6X5 TAN STRL LF (GAUZE/BANDAGES/DRESSINGS) IMPLANT
BNDG GAUZE ELAST 4 BULKY (GAUZE/BANDAGES/DRESSINGS) ×4 IMPLANT
COVER SURGICAL LIGHT HANDLE (MISCELLANEOUS) ×4 IMPLANT
DRAPE U-SHAPE 47X51 STRL (DRAPES) ×2 IMPLANT
DRSG ADAPTIC 3X8 NADH LF (GAUZE/BANDAGES/DRESSINGS) ×2 IMPLANT
DURAPREP 26ML APPLICATOR (WOUND CARE) ×2 IMPLANT
ELECT REM PT RETURN 9FT ADLT (ELECTROSURGICAL)
ELECTRODE REM PT RTRN 9FT ADLT (ELECTROSURGICAL) IMPLANT
GAUZE SPONGE 4X4 12PLY STRL (GAUZE/BANDAGES/DRESSINGS) ×2 IMPLANT
GLOVE SURG ORTHO LTX SZ9 (GLOVE) ×2 IMPLANT
GLOVE SURG UNDER POLY LF SZ9 (GLOVE) ×2 IMPLANT
GOWN STRL REUS W/ TWL XL LVL3 (GOWN DISPOSABLE) ×2 IMPLANT
GOWN STRL REUS W/TWL XL LVL3 (GOWN DISPOSABLE) ×4
HANDPIECE INTERPULSE COAX TIP (DISPOSABLE)
KIT BASIN OR (CUSTOM PROCEDURE TRAY) ×2 IMPLANT
KIT TURNOVER KIT B (KITS) ×2 IMPLANT
MANIFOLD NEPTUNE II (INSTRUMENTS) ×2 IMPLANT
NS IRRIG 1000ML POUR BTL (IV SOLUTION) ×2 IMPLANT
PACK SHOULDER (CUSTOM PROCEDURE TRAY) ×2 IMPLANT
PAD ARMBOARD 7.5X6 YLW CONV (MISCELLANEOUS) ×4 IMPLANT
SET HNDPC FAN SPRY TIP SCT (DISPOSABLE) IMPLANT
SUT ETHILON 2 0 PSLX (SUTURE) ×2 IMPLANT
SWAB COLLECTION DEVICE MRSA (MISCELLANEOUS) ×2 IMPLANT
SWAB CULTURE ESWAB REG 1ML (MISCELLANEOUS) IMPLANT
TOWEL GREEN STERILE (TOWEL DISPOSABLE) ×2 IMPLANT
TUBE CONNECTING 12X1/4 (SUCTIONS) ×2 IMPLANT

## 2021-12-20 NOTE — Op Note (Addendum)
12/20/2021 ? ?11:01 AM ? ?PATIENT:  Marilyn [name]   ? ?PRE-OPERATIVE DIAGNOSIS:  Abscess Left Shoulder ? ?POST-OPERATIVE DIAGNOSIS:  Same ? ?PROCEDURE:  LEFT SHOULDER DEBRIDEMENT ? ?SURGEON:  [name], MD ? ?PHYSICIAN ASSISTANT:None ?ANESTHESIA:   General ? ?PREOPERATIVE INDICATIONS:  AZAYLAH STAILEY is a  51 y.o. female with a diagnosis of Abscess Left Shoulder who failed conservative measures and elected for surgical management.   ? ?The risks benefits and alternatives were discussed with the patient preoperatively including but not limited to the risks of infection, bleeding, nerve injury, cardiopulmonary complications, the need for revision surgery, among others, and the patient was willing to proceed. ? ?OPERATIVE IMPLANTS: Installation wound VAC cleanse choice sponges x2 ? ?@ENCIMAGES @ ? ?OPERATIVE FINDINGS: Tissue sent for cultures.  Wound bed had healthy granulation tissue after debridement this did not extend down to the joint. ? ?OPERATIVE PROCEDURE: Patient was brought the operating room and underwent a general anesthetic.  After adequate levels anesthesia were obtained patient's left upper extremity was prepped using DuraPrep draped into a sterile field a timeout was called.  The wound was opened the [facility] tissue graft had not incorporated.  The nonviable tissue was excised this left a wound bed with healthy granulation tissue this did not probe down to to the joint.  The wound was irrigated with normal saline.  The cleanse choice wound VAC sponge was applied secured with derma tack and Dermabond this had a good suction fit this was set for installation therapy.  Plan to return to the operating room on Friday for further debridement ? ?Debridement type: Excisional Debridement ? ?Side: left ? ?Body Location: shoulder  ? ?Tools used for debridement: scalpel and rongeur ? ?Pre-debridement Wound size (cm):   Length: 1        Width: 1     Depth: 1  ? ?Post-debridement Wound size (cm):   Length: 13         Width: 5     Depth: 3  ? ?Debridement depth beyond dead/damaged tissue down to healthy viable tissue: yes ? ?Tissue layer involved: skin, subcutaneous tissue, muscle / fascia ? ?Nature of tissue removed: Devitalized Tissue and Non-viable tissue ? ?Irrigation volume: 1 liter    ? ?Irrigation fluid type: Normal Saline ? ? ? ?DISCHARGE PLANNING: ? ?Antibiotic duration: Continue IV antibiotics adjust according to cultures ? ?Weightbearing: Nonweightbearing through the left upper extremity ? ?Pain medication: Continue opioid pathway ? ?Dressing care/ Wound VAC: Continue wound VAC ? ?Ambulatory devices: No use of left arm with ambulation ? ?Discharge to: Anticipate return to the operating room on Friday. ? ?Follow-up: In the office 1 week post operative. ? ? ? ? ? ? ?  ?

## 2021-12-20 NOTE — Interval H&P Note (Signed)
History and Physical Interval Note: ? ?12/20/2021 ?6:45 AM ? ?Marilyn [name]  has presented today for surgery, with the diagnosis of Abscess Left Shoulder.  The various methods of treatment have been discussed with the patient and family. After consideration of risks, benefits and other options for treatment, the patient has consented to  Procedure(s): ?LEFT SHOULDER DEBRIDEMENT (Left) as a surgical intervention.  The patient's history has been reviewed, patient examined, no change in status, stable for surgery.  I have reviewed the patient's chart and labs.  Questions were answered to the patient's satisfaction.   ? ? ?[name] ? ? ?

## 2021-12-20 NOTE — Progress Notes (Signed)
?PROGRESS NOTE ? ? ? ?Marilyn [name]  [id number] DOB: 03-17-1971 DOA: 12/17/2021 ?PCP: Bridget Hartshorn, NP  ? ? ?Brief Narrative:  ?51 year old female with history of morbid obesity, obstructive sleep apnea on CPAP, anxiety/depression/GERD, hypothyroidism and hypertension, left reverse shoulder arthroplasty on 3/2 with subsequent I&D of superficial infection 3/17, repeat I&D and wound closure 3/22 admitted with worsening left shoulder pain and drainage.  Hemodynamically stable.  Painful.  Admitted with postop infection.  Started on vancomycin and Zosyn. ? ? ?Assessment & Plan: ?  ?Postop wound infection with recent shoulder arthroplasty: ?Blood cultures negative so far.  Currently on vancomycin and Zosyn.   ?Underwent debridement and wound VAC placement today.  Tissue cultures collected.  Reportedly, not involving prosthesis.  Adequate pain medications.  IV and oral opiates for pain control. ?Anticipate second stage surgery on 4/7. ? ?Sleep apnea: Uses CPAP at naps and at night.  CTA negative for pulmonary embolism.  Duplex is negative for DVT. ? ?Chronic intermittent asthma: Stable.  Albuterol as needed. ? ?Hypothyroidism: Euvolemic on Synthroid. ? ?Essential hypertension: Blood pressure stable on hydrochlorothiazide. ? ?Hypokalemia/hypomagnesemia: On a scheduled replacement. ? ?Anxiety/depression: On Cymbalta and gabapentin were continued. ? ? ?DVT prophylaxis:   Lovenox subcu. ? ? ?Code Status: Full code ?Family Communication: Daughter at the bedside. ?Disposition Plan: Status is: Inpatient ?Remains inpatient appropriate because: Inpatient surgery planned ?  ? ? ?Consultants:  ?Orthopedics ? ?Procedures:  ?None ? ?Antimicrobials:  ?Vancomycin Zosyn 4/2--- ? ? ?Subjective: ? ?Patient seen and examined.  Left shoulder hurts and she was hungry as she had to skip breakfast in the morning.  No other overnight events. ?By the time this note is created, she had gone to debridement. ? ?Objective: ?Vitals:  ?  12/20/21 1050 12/20/21 1105 12/20/21 1120 12/20/21 1209  ?BP: (!) 133/110 132/71 (!) 147/59 (!) 168/60  ?Pulse: 80 77 78 76  ?Resp: 12 (!) 26 (!) 24 20  ?Temp: 98.2 ?F (36.8 ?C)  97.8 ?F (36.6 ?C) 98.4 ?F (36.9 ?C)  ?TempSrc:    Axillary  ?SpO2: 100% 97% 92% 92%  ?Weight:      ?Height:      ? ? ?Intake/Output Summary (Last 24 hours) at 12/20/2021 1318 ?Last data filed at 12/20/2021 1046 ?Gross per 24 hour  ?Intake 840 ml  ?Output 10 ml  ?Net 830 ml  ? ?Filed Weights  ? 12/18/21 0500 12/18/21 0607 12/20/21 0906  ?Weight: 124 kg (!) 169.3 kg (!) 162.4 kg  ? ? ?Examination: ? ?General exam: Mildly anxious but comfortable on room air. ?Respiratory system: Clear to auscultation. Respiratory effort normal.  On room air. ?Cardiovascular system: S1 & S2 heard, RRR.  Edematous legs. ?Gastrointestinal system: Abdomen is nondistended, soft and nontender. No organomegaly or masses felt. Normal bowel sounds heard.  Pendulous.  Nontender. ?Central nervous system: No focal deficit. ?Left shoulder with surgical dressing, immediate postop.  Not removed by me. ? ? ? ? ?Data Reviewed: I have personally reviewed following labs and imaging studies ? ?CBC: ?Recent Labs  ?Lab 12/17/21 ?2149 12/19/21 ?0308  ?WBC 6.3 7.0  ?NEUTROABS 3.7 4.3  ?HGB 9.7* 8.7*  ?HCT 32.4* 28.0*  ?MCV 95.3 92.1  ?PLT 300 247  ? ?Basic Metabolic Panel: ?Recent Labs  ?Lab 12/17/21 ?2214 12/18/21 ?[id number] 12/19/21 ?0308  ?NA 138  --  138  ?K 3.4*  --  3.1*  ?CL 102  --  101  ?CO2 22  --  27  ?GLUCOSE 111*  --  118*  ?  BUN 8  --  9  ?CREATININE 1.32*  --  1.46*  ?CALCIUM 9.3  --  8.9  ?MG  --  1.5* 1.6*  ?PHOS  --   --  3.7  ? ?GFR: ?Estimated Creatinine Clearance: 68.6 mL/min (A) (by C-G formula based on SCr of 1.46 mg/dL (H)). ?Liver Function Tests: ?No results for input(s): AST, ALT, ALKPHOS, BILITOT, PROT, ALBUMIN in the last 168 hours. ?No results for input(s): LIPASE, AMYLASE in the last 168 hours. ?No results for input(s): AMMONIA in the last 168 hours. ?Coagulation  Profile: ?No results for input(s): INR, PROTIME in the last 168 hours. ?Cardiac Enzymes: ?No results for input(s): CKTOTAL, CKMB, CKMBINDEX, TROPONINI in the last 168 hours. ?BNP (last 3 results) ?No results for input(s): PROBNP in the last 8760 hours. ?HbA1C: ?No results for input(s): HGBA1C in the last 72 hours. ?CBG: ?No results for input(s): GLUCAP in the last 168 hours. ?Lipid Profile: ?No results for input(s): CHOL, HDL, LDLCALC, TRIG, CHOLHDL, LDLDIRECT in the last 72 hours. ?Thyroid Function Tests: ?No results for input(s): TSH, T4TOTAL, FREET4, T3FREE, THYROIDAB in the last 72 hours. ?Anemia Panel: ?No results for input(s): VITAMINB12, FOLATE, FERRITIN, TIBC, IRON, RETICCTPCT in the last 72 hours. ?Sepsis Labs: ?Recent Labs  ?Lab 12/17/21 ?2214 12/18/21 ?1125  ?LATICACIDVEN 2.7* 1.7  ? ? ?Recent Results (from the past 240 hour(s))  ?Resp Panel by RT-PCR (Flu A&B, Covid) Nasopharyngeal Swab     Status: None  ? Collection Time: 12/18/21  4:32 AM  ? Specimen: Nasopharyngeal Swab; Nasopharyngeal(NP) swabs in vial transport medium  ?Result Value Ref Range Status  ? SARS Coronavirus 2 by RT PCR NEGATIVE NEGATIVE Final  ?  Comment: (NOTE) ?SARS-CoV-2 target nucleic acids are NOT DETECTED. ? ?The SARS-CoV-2 RNA is generally detectable in upper respiratory ?specimens during the acute phase of infection. The lowest ?concentration of SARS-CoV-2 viral copies this assay can detect is ?138 copies/mL. A negative result does not preclude SARS-Cov-2 ?infection and should not be used as the sole basis for treatment or ?other patient management decisions. A negative result may occur with  ?improper specimen collection/handling, submission of specimen other ?than nasopharyngeal swab, presence of viral mutation(s) within the ?areas targeted by this assay, and inadequate number of viral ?copies(<138 copies/mL). A negative result must be combined with ?clinical observations, patient history, and epidemiological ?information. The  expected result is Negative. ? ?Fact Sheet for Patients:  ?EntrepreneurPulse.com.au ? ?Fact Sheet for Healthcare Providers:  ?IncredibleEmployment.be ? ?This test is no t yet approved or cleared by the [place] FDA and  ?has been authorized for detection and/or diagnosis of SARS-CoV-2 by ?FDA under an Emergency Use Authorization (EUA). This EUA will remain  ?in effect (meaning this test can be used) for the duration of the ?COVID-19 declaration under Section 564(b)(1) of the Act, 21 ?U.S.C.section 360bbb-3(b)(1), unless the authorization is terminated  ?or revoked sooner.  ? ? ?  ? Influenza A by PCR NEGATIVE NEGATIVE Final  ? Influenza B by PCR NEGATIVE NEGATIVE Final  ?  Comment: (NOTE) ?The Xpert Xpress SARS-CoV-2/FLU/RSV plus assay is intended as an aid ?in the diagnosis of influenza from Nasopharyngeal swab specimens and ?should not be used as a sole basis for treatment. Nasal washings and ?aspirates are unacceptable for Xpert Xpress SARS-CoV-2/FLU/RSV ?testing. ? ?Fact Sheet for Patients: ?EntrepreneurPulse.com.au ? ?Fact Sheet for Healthcare Providers: ?IncredibleEmployment.be ? ?This test is not yet approved or cleared by the [place] FDA and ?has been authorized for detection and/or diagnosis of  SARS-CoV-2 by ?FDA under an Emergency Use Authorization (EUA). This EUA will remain ?in effect (meaning this test can be used) for the duration of the ?COVID-19 declaration under Section 564(b)(1) of the Act, 21 U.S.C. ?section 360bbb-3(b)(1), unless the authorization is terminated or ?revoked. ? ?Performed at [facility], [place] [address] [place], [name] ?[address] ?  ?Surgical PCR screen     Status: None  ? Collection Time: 12/19/21  1:46 PM  ? Specimen: Nasal Mucosa; Nasal Swab  ?Result Value Ref Range Status  ? MRSA, PCR NEGATIVE NEGATIVE Final  ? Staphylococcus aureus NEGATIVE NEGATIVE Final  ?  Comment: (NOTE) ?The Xpert SA  Assay (FDA approved for NASAL specimens in patients 64 ?years of age and older), is one component of a comprehensive ?surveillance program. It is not intended to diagnose infection nor to ?guide or monit

## 2021-12-20 NOTE — Anesthesia Procedure Notes (Signed)
Procedure Name: Intubation ?Date/Time: 12/20/2021 10:10 AM ?Performed by: Lance Coon, CRNA ?Pre-anesthesia Checklist: Patient identified, Emergency Drugs available, Suction available, Patient being monitored and Timeout performed ?Patient Re-evaluated:Patient Re-evaluated prior to induction ?Oxygen Delivery Method: Circle system utilized ?Preoxygenation: Pre-oxygenation with 100% oxygen ?Induction Type: IV induction ?Ventilation: Mask ventilation without difficulty ?Laryngoscope Size: Sabra Heck and 3 ?Grade View: Grade I ?Tube type: Oral ?Tube size: 7.0 mm ?Number of attempts: 1 ?Airway Equipment and Method: Stylet ?Placement Confirmation: ETT inserted through vocal cords under direct vision, positive ETCO2 and breath sounds checked- equal and bilateral ?Secured at: 21 cm ?Tube secured with: Tape ?Dental Injury: Teeth and Oropharynx as per pre-operative assessment  ? ? ? ? ?

## 2021-12-20 NOTE — Transfer of Care (Signed)
Immediate Anesthesia Transfer of Care Note ? ?Patient: Marilyn [name] ? ?Procedure(s) Performed: LEFT SHOULDER DEBRIDEMENT (Left) ? ?Patient Location: PACU ? ?Anesthesia Type:General ? ?Level of Consciousness: drowsy and patient cooperative ? ?Airway & Oxygen Therapy: Patient Spontanous Breathing ? ?Post-op Assessment: Report given to RN and Post -op Vital signs reviewed and stable ? ?Post vital signs: Reviewed and stable ? ?Last Vitals:  ?Vitals Value Taken Time  ?BP 133/110 12/20/21 1050  ?Temp    ?Pulse 77 12/20/21 1051  ?Resp 15 12/20/21 1051  ?SpO2 100 % 12/20/21 1051  ?Vitals shown include unvalidated device data. ? ?Last Pain:  ?Vitals:  ? 12/20/21 0906  ?TempSrc: Oral  ?PainSc:   ?   ? ?Patients Stated Pain Goal: 0 (12/20/21 0146) ? ?Complications: No notable events documented. ?

## 2021-12-20 NOTE — Anesthesia Preprocedure Evaluation (Signed)
Anesthesia Evaluation  ?Patient identified by MRN, date of birth, ID band ?Patient awake ? ? ? ?Reviewed: ?Allergy & Precautions, NPO status , Patient's Chart, lab work & pertinent test results ? ?Airway ?Mallampati: II ? ? ?Neck ROM: full ? ? ? Dental ?  ?Pulmonary ?shortness of breath, sleep apnea , former smoker,  ?  ?breath sounds clear to auscultation ? ? ? ? ? ? Cardiovascular ?hypertension, + angina  ?Rhythm:regular Rate:Normal ? ? ?  ?Neuro/Psych ? Headaches, PSYCHIATRIC DISORDERS Anxiety Depression  Neuromuscular disease   ? GI/Hepatic ?GERD  ,  ?Endo/Other  ?Hypothyroidism Morbid obesity ? Renal/GU ?Renal InsufficiencyRenal disease  ? ?  ?Musculoskeletal ? ?(+) Arthritis ,  ? Abdominal ?  ?Peds ? Hematology ? ?(+) Blood dyscrasia, anemia ,   ?Anesthesia Other Findings ? ? Reproductive/Obstetrics ? ?  ? ? ? ? ? ? ? ? ? ? ? ? ? ?  ?  ? ? ? ? ? ? ? ? ?Anesthesia Physical ?Anesthesia Plan ? ?ASA: 3 ? ?Anesthesia Plan: General  ? ?Post-op Pain Management: Regional block*  ? ?Induction: Intravenous ? ?PONV Risk Score and Plan: 3 and Ondansetron, Dexamethasone, Midazolam and Treatment may vary due to age or medical condition ? ?Airway Management Planned: Oral ETT ? ?Additional Equipment:  ? ?Intra-op Plan:  ? ?Post-operative Plan: Extubation in OR ? ?Informed Consent: I have reviewed the patients History and Physical, chart, labs and discussed the procedure including the risks, benefits and alternatives for the proposed anesthesia with the patient or authorized representative who has indicated his/her understanding and acceptance.  ? ? ? ?Dental advisory given ? ?Plan Discussed with: CRNA, Anesthesiologist and Surgeon ? ?Anesthesia Plan Comments:   ? ? ? ? ? ? ?Anesthesia Quick Evaluation ? ?

## 2021-12-20 NOTE — Anesthesia Procedure Notes (Signed)
Anesthesia Regional Block: Interscalene brachial plexus block  ? ?Pre-Anesthetic Checklist: , timeout performed,  Correct Patient, Correct Site, Correct Laterality,  Correct Procedure, Correct Position, site marked,  Risks and benefits discussed,  Surgical consent,  Pre-op evaluation,  At surgeon's request and post-op pain management ? ?Laterality: Left ? ?Prep: chloraprep     ?  ?Needles:  ?Injection technique: Single-shot ? ?Needle Type: Echogenic Stimulator Needle   ? ? ?Needle Length: 5cm  ?Needle Gauge: 22  ? ? ? ?Additional Needles: ? ? ?Procedures:, nerve stimulator,,,,,    ? ?Nerve Stimulator or Paresthesia:  ?Response: biceps flexion, 0.45 mA ? ?Additional Responses:  ? ?Narrative:  ?Start time: 12/20/2021 9:40 AM ?End time: 12/20/2021 9:51 AM ?Injection made incrementally with aspirations every 5 mL. ? ?Performed by: Personally  ?Anesthesiologist: Achille Rich, MD ? ?Additional Notes: ?Functioning IV was confirmed and monitors were applied.  A 30mm 22ga Arrow echogenic stimulator needle was used. Sterile prep and drape,hand hygiene and sterile gloves were used.  Negative aspiration and negative test dose prior to incremental administration of local anesthetic. The patient tolerated the procedure well. ? ?Ultrasound guidance: relevent anatomy identified, needle position confirmed, local anesthetic spread visualized around nerve(s), vascular puncture avoided.  Image printed for medical record.  ? ? ? ? ?

## 2021-12-21 ENCOUNTER — Encounter (HOSPITAL_COMMUNITY): Payer: Self-pay | Admitting: Orthopedic Surgery

## 2021-12-21 LAB — CREATININE, SERUM
Creatinine, Ser: 1.51 mg/dL — ABNORMAL HIGH (ref 0.44–1.00)
GFR, Estimated: 41 mL/min — ABNORMAL LOW (ref >60)

## 2021-12-21 MED ORDER — POVIDONE-IODINE 10 % EX SWAB: 2.0000 "application " | Freq: Once | CUTANEOUS | Status: DC

## 2021-12-21 MED ORDER — CHLORHEXIDINE GLUCONATE 4 % EX LIQD
60.0000 mL | Freq: Once | CUTANEOUS | Status: DC
  Filled 2021-12-21: qty 60

## 2021-12-21 MED ORDER — HYDRALAZINE HCL 20 MG/ML IJ SOLN: 10.0000 mg | Freq: Four times a day (QID) | INTRAMUSCULAR | Status: DC | PRN

## 2021-12-21 NOTE — Progress Notes (Signed)
?PROGRESS NOTE ? ? ? ?Marilyn [name]  ZJQ:[id number] DOB: Jul 02, 1971 DOA: 12/17/2021 ?PCP: Rebecka Apley, NP  ? ? ?Brief Narrative:  ?51 year old female with history of morbid obesity, obstructive sleep apnea on CPAP, anxiety/depression/GERD, hypothyroidism and hypertension, left reverse shoulder arthroplasty on 3/2 with subsequent I&D of superficial infection 3/17, repeat I&D and wound closure 3/22 admitted with worsening left shoulder pain and drainage.  Hemodynamically stable.  Painful.  Admitted with postop infection.  Started on vancomycin and Zosyn. ? ? ?Assessment & Plan: ?  ?Postop wound infection with recent shoulder arthroplasty: ?Blood cultures negative so far.  Currently on vancomycin and Zosyn.   ?Underwent debridement and wound VAC placement 4/5.   ?Tissue cultures negative so far.   ?Debridement and wound culture planned for 4/7.   ?Since there is no deep tissue infection, anticipate discharge home with broad-spectrum oral antibiotics after wound closure.  Adequate pain medications.  Mobilize with therapies. ? ?Sleep apnea: Uses CPAP at naps and at night.  CTA negative for pulmonary embolism.  Duplex is negative for DVT. ? ?Chronic intermittent asthma: Stable.  Albuterol as needed. ? ?Hypothyroidism: Euvolemic on Synthroid. ? ?Essential hypertension: Blood pressure stable on hydrochlorothiazide. ? ?Hypokalemia/hypomagnesemia: On a scheduled replacement. ? ?Anxiety/depression: On Cymbalta and gabapentin were continued. ? ? ?DVT prophylaxis: SCDs Start: 12/20/21 2105  Lovenox subcu. ? ? ?Code Status: Full code ?Family Communication: Daughter at the bedside. ?Disposition Plan: Status is: Inpatient ?Remains inpatient appropriate because: Inpatient surgery planned ?  ? ? ?Consultants:  ?Orthopedics ? ?Procedures:  ?None ? ?Antimicrobials:  ?Vancomycin Zosyn 4/2--- ? ? ?Subjective: ? ?Seen and examined.  Mild pain persist.  No other overnight events. ? ?Objective: ?Vitals:  ? 12/21/21 0455 12/21/21  0500 12/21/21 0804 12/21/21 1252  ?BP:   105/88 120/66  ?Pulse: 75 74 78 81  ?Resp: 19 16 14 16   ?Temp:   97.9 ?F (36.6 ?C) 98.4 ?F (36.9 ?C)  ?TempSrc:   Oral Oral  ?SpO2: 98% 98% 93% 98%  ?Weight:      ?Height:      ? ? ?Intake/Output Summary (Last 24 hours) at 12/21/2021 1356 ?Last data filed at 12/20/2021 2000 ?Gross per 24 hour  ?Intake 302.84 ml  ?Output 65 ml  ?Net 237.84 ml  ? ?Filed Weights  ? 12/18/21 0500 12/18/21 0607 12/20/21 0906  ?Weight: 124 kg (!) 169.3 kg (!) 162.4 kg  ? ? ?Examination: ? ?General exam: Looks comfortable. ?Respiratory system: Clear to auscultation. Respiratory effort normal.  On room air. ?Cardiovascular system: S1 & S2 heard, RRR.   ?Gastrointestinal system: Abdomen is nondistended, soft and nontender. No organomegaly or masses felt. Normal bowel sounds heard.  Pendulous.  Nontender. ?Central nervous system: No focal deficit. ?Left shoulder with surgical dressing, wound VAC dressing present.  Wound VAC with no drainage in the canister. ? ? ? ? ?Data Reviewed: I have personally reviewed following labs and imaging studies ? ?CBC: ?Recent Labs  ?Lab 12/17/21 ?2149 12/19/21 ?0308  ?WBC 6.3 7.0  ?NEUTROABS 3.7 4.3  ?HGB 9.7* 8.7*  ?HCT 32.4* 28.0*  ?MCV 95.3 92.1  ?PLT 300 247  ? ?Basic Metabolic Panel: ?Recent Labs  ?Lab 12/17/21 ?2214 12/18/21 ?02/17/22 12/19/21 ?0308 12/21/21 ?02/20/22  ?NA 138  --  138  --   ?K 3.4*  --  3.1*  --   ?CL 102  --  101  --   ?CO2 22  --  27  --   ?GLUCOSE 111*  --  118*  --   ?  BUN 8  --  9  --   ?CREATININE 1.32*  --  1.46* 1.51*  ?CALCIUM 9.3  --  8.9  --   ?MG  --  1.5* 1.6*  --   ?PHOS  --   --  3.7  --   ? ?GFR: ?Estimated Creatinine Clearance: 66.3 mL/min (A) (by C-G formula based on SCr of 1.51 mg/dL (H)). ?Liver Function Tests: ?No results for input(s): AST, ALT, ALKPHOS, BILITOT, PROT, ALBUMIN in the last 168 hours. ?No results for input(s): LIPASE, AMYLASE in the last 168 hours. ?No results for input(s): AMMONIA in the last 168 hours. ?Coagulation  Profile: ?No results for input(s): INR, PROTIME in the last 168 hours. ?Cardiac Enzymes: ?No results for input(s): CKTOTAL, CKMB, CKMBINDEX, TROPONINI in the last 168 hours. ?BNP (last 3 results) ?No results for input(s): PROBNP in the last 8760 hours. ?HbA1C: ?No results for input(s): HGBA1C in the last 72 hours. ?CBG: ?No results for input(s): GLUCAP in the last 168 hours. ?Lipid Profile: ?No results for input(s): CHOL, HDL, LDLCALC, TRIG, CHOLHDL, LDLDIRECT in the last 72 hours. ?Thyroid Function Tests: ?No results for input(s): TSH, T4TOTAL, FREET4, T3FREE, THYROIDAB in the last 72 hours. ?Anemia Panel: ?No results for input(s): VITAMINB12, FOLATE, FERRITIN, TIBC, IRON, RETICCTPCT in the last 72 hours. ?Sepsis Labs: ?Recent Labs  ?Lab 12/17/21 ?2214 12/18/21 ?1125  ?LATICACIDVEN 2.7* 1.7  ? ? ?Recent Results (from the past 240 hour(s))  ?Resp Panel by RT-PCR (Flu A&B, Covid) Nasopharyngeal Swab     Status: None  ? Collection Time: 12/18/21  4:32 AM  ? Specimen: Nasopharyngeal Swab; Nasopharyngeal(NP) swabs in vial transport medium  ?Result Value Ref Range Status  ? SARS Coronavirus 2 by RT PCR NEGATIVE NEGATIVE Final  ?  Comment: (NOTE) ?SARS-CoV-2 target nucleic acids are NOT DETECTED. ? ?The SARS-CoV-2 RNA is generally detectable in upper respiratory ?specimens during the acute phase of infection. The lowest ?concentration of SARS-CoV-2 viral copies this assay can detect is ?138 copies/mL. A negative result does not preclude SARS-Cov-2 ?infection and should not be used as the sole basis for treatment or ?other patient management decisions. A negative result may occur with  ?improper specimen collection/handling, submission of specimen other ?than nasopharyngeal swab, presence of viral mutation(s) within the ?areas targeted by this assay, and inadequate number of viral ?copies(<138 copies/mL). A negative result must be combined with ?clinical observations, patient history, and epidemiological ?information. The  expected result is Negative. ? ?Fact Sheet for Patients:  ?BloggerCourse.comhttps://www.fda.gov/media/152166/download ? ?Fact Sheet for Healthcare Providers:  ?SeriousBroker.ithttps://www.fda.gov/media/152162/download ? ?This test is no t yet approved or cleared by the [place] FDA and  ?has been authorized for detection and/or diagnosis of SARS-CoV-2 by ?FDA under an Emergency Use Authorization (EUA). This EUA will remain  ?in effect (meaning this test can be used) for the duration of the ?COVID-19 declaration under Section 564(b)(1) of the Act, 21 ?U.S.C.section 360bbb-3(b)(1), unless the authorization is terminated  ?or revoked sooner.  ? ? ?  ? Influenza A by PCR NEGATIVE NEGATIVE Final  ? Influenza B by PCR NEGATIVE NEGATIVE Final  ?  Comment: (NOTE) ?The Xpert Xpress SARS-CoV-2/FLU/RSV plus assay is intended as an aid ?in the diagnosis of influenza from Nasopharyngeal swab specimens and ?should not be used as a sole basis for treatment. Nasal washings and ?aspirates are unacceptable for Xpert Xpress SARS-CoV-2/FLU/RSV ?testing. ? ?Fact Sheet for Patients: ?BloggerCourse.comhttps://www.fda.gov/media/152166/download ? ?Fact Sheet for Healthcare Providers: ?SeriousBroker.ithttps://www.fda.gov/media/152162/download ? ?This test is not yet approved or cleared by  the [employer] and ?has been authorized for detection and/or diagnosis of SARS-CoV-2 by ?FDA under an Emergency Use Authorization (EUA). This EUA will remain ?in effect (meaning this test can be used) for the duration of the ?COVID-19 declaration under Section 564(b)(1) of the Act, 21 U.S.C. ?section 360bbb-3(b)(1), unless the authorization is terminated or ?revoked. ? ?Performed at [facility], [address], [place], [place] ?[address] ?  ?Surgical PCR screen     Status: None  ? Collection Time: 12/19/21  1:46 PM  ? Specimen: Nasal Mucosa; Nasal Swab  ?Result Value Ref Range Status  ? MRSA, PCR NEGATIVE NEGATIVE Final  ? Staphylococcus aureus NEGATIVE NEGATIVE Final  ?  Comment: (NOTE) ?The Xpert SA  Assay (FDA approved for NASAL specimens in patients 28 ?years of age and older), is one component of a comprehensive ?surveillance program. It is not intended to diagnose infection nor to ?guide or monitor tr

## 2021-12-21 NOTE — TOC Initial Note (Signed)
Transition of Care (TOC) - Initial/Assessment Note  ? ? ?Patient Details  ?Name: Marilyn [name] ?MRN: [id number] ?Date of Birth: 06-27-1971 ? ?Transition of Care (TOC) CM/SW Contact:    ?Harriet Masson, RN ?Phone Number: ?12/21/2021, 2:35 PM ? ?Clinical Narrative:                 ?Spoke to patient and daughter regarding transition needs. Daughter lives 5 mins away. Patient has had a prevena vac in the past. Daughter asking about home 02 and home health. Patient has used [facility] now [facility] in the past and doesn't want to use them again. Nurse to do an ambulatory sat due to getting out of breath while walking. Patient has a cane and walker at home. Daughter would like HH-RN and aide if patient has to do dressing changes.  ?TOC will continue to follow ? ?Expected Discharge Plan: Home w Home Health Services ?  ? ? ?Patient Goals and CMS Choice ?Patient states their goals for this hospitalization and ongoing recovery are:: return home ?  ?  ? ?Expected Discharge Plan and Services ?Expected Discharge Plan: Home w Home Health Services ?  ?Discharge Planning Services: CM Consult ?  ?Living arrangements for the past 2 months: Single Family Home ?                ?  ?  ?  ?  ?  ?  ?  ?  ?  ?  ? ?Prior Living Arrangements/Services ?Living arrangements for the past 2 months: Single Family Home ?Lives with:: Self ?Patient language and need for interpreter reviewed:: Yes ?Do you feel safe going back to the place where you live?: Yes      ?Need for Family Participation in Patient Care: Yes (Comment) ?Care giver support system in place?: Yes (comment) ?Current home services: DME (cane walker) ?Criminal Activity/Legal Involvement Pertinent to Current Situation/Hospitalization: No - Comment as needed ? ?Activities of Daily Living ?Home Assistive Devices/Equipment: Other (Comment) (cane) ?ADL Screening (condition at time of admission) ?Patient's cognitive ability adequate to safely complete daily activities?: Yes ?Is the  patient deaf or have difficulty hearing?: No ?Does the patient have difficulty seeing, even when wearing glasses/contacts?: Yes ?Does the patient have difficulty concentrating, remembering, or making decisions?: No ?Patient able to express need for assistance with ADLs?: Yes ?Does the patient have difficulty dressing or bathing?: Yes ?Independently performs ADLs?: No ?Communication: Independent ?Dressing (OT): Needs assistance ?Is this a change from baseline?: Change from baseline, expected to last >3 days ?Grooming: Needs assistance ?Is this a change from baseline?: Change from baseline, expected to last >3 days ?Feeding: Independent ?Bathing: Needs assistance ?Is this a change from baseline?: Change from baseline, expected to last >3 days ?Toileting: Needs assistance ?Is this a change from baseline?: Change from baseline, expected to last >3days ?In/Out Bed: Needs assistance ?Is this a change from baseline?: Change from baseline, expected to last >3 days ?Walks in Home: Needs assistance ?Is this a change from baseline?: Change from baseline, expected to last >3 days ?Does the patient have difficulty walking or climbing stairs?: Yes ?Weakness of Legs: Both ?Weakness of Arms/Hands: Left ? ?Permission Sought/Granted ?  ?  ?   ?   ?   ?   ? ?Emotional Assessment ?Appearance:: Appears stated age ?Attitude/Demeanor/Rapport: Engaged ?Affect (typically observed): Accepting ?Orientation: : Oriented to Self, Oriented to Place, Oriented to  Time, Oriented to Situation ?Alcohol / Substance Use: Not Applicable ?Psych Involvement: No (comment) ? ?Admission diagnosis:  Shoulder abscess [L02.419] ?Acute pain of left shoulder [M25.512] ?Patient Active Problem List  ? Diagnosis Date Noted  ? Lactic acidosis 12/18/2021  ? Dyspnea on exertion 12/18/2021  ? Anemia 12/18/2021  ? Gout 12/18/2021  ? Acute pain of left shoulder   ? Arthritis of left shoulder region   ? 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 Unilateral  primary osteoarthritis, right hip 05/09/2021  ? S/P lumbar spinal fusion 05/09/2021  ? S/P insertion of spinal cord stimulator 05/09/2021  ? H/O total knee replacement, bilateral 01/18/2021  ? Lumbar post-laminectomy syndrome 04/06/2020  ? Long-term current use of opiate analgesic 11/04/2019  ? Leg pain, right 10/20/2019  ? Dysesthesia 10/20/2019  ? 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 Other spondylosis with radiculopathy, lumbar region   ? Status post lumbar laminectomy 06/23/2019  ? C. difficile colitis 05/22/2019  ? Hypophosphatemia 05/21/2019  ? Syncope 05/20/2019  ? OSA on CPAP   ? Acute gastroenteritis   ? Anemia due to blood loss, acute 06/18/2018  ?  Class: Acute  ? Plantar fasciitis of left foot 06/16/2018  ?  Class: Chronic  ? Tendonitis, Achilles, right 06/16/2018  ?  Class: Chronic  ? Osteochondral talar dome lesion 06/16/2018  ?  Class: Chronic  ? Deep incisional surgical site infection   ? Superficial incisional surgical site infection 06/11/2018  ?  Class: Acute  ? Right ankle effusion 06/11/2018  ?  Class: Acute  ? Morbid (severe) obesity due to excess calories (HCC) 05/29/2018  ?  Class: Chronic  ? Spondylolisthesis, lumbar region 05/27/2018  ?  Class: Chronic  ? Spinal stenosis of lumbar region 05/27/2018  ?  Class: Chronic  ? Urinary tract infection 05/27/2018  ?  Class: Acute  ? Fusion of spine of lumbar region 05/27/2018  ? Pain of right hip joint 07/03/2017  ? Acute right-sided low back pain with right-sided sciatica 03/27/2017  ? Chronic right shoulder pain 02/13/2017  ? History of rotator cuff tear 11/30/2016  ? Impingement syndrome of right shoulder 11/30/2016  ? Exacerbation of asthma 07/18/2016  ? Hypokalemia 07/18/2016  ? Hypertension 07/18/2016  ? Depression with anxiety 07/18/2016  ? Hypothyroidism 07/18/2016  ? Hyperglycemia 07/18/2016  ? Asthma, chronic 07/18/2016  ? Surgical wound dehiscence left hip; questionable superficial infection 11/10/2015  ? Postoperative  wound infection 11/10/2015  ? Osteoarthritis of left hip 09/09/2015  ? Status post total replacement of left hip 09/09/2015  ? Obesity (BMI 35.0-39.9 without comorbidity) 04/28/2013  ? ?PCP:  Rebecka Apley, NP ?Pharmacy:   ?[facility] [address], [place] - [address] ?[address] ?[place] [address] ?Phone: [phone] Fax: [phone] ? ? ? ? ?Social Determinants of Health (SDOH) Interventions ?  ? ?Readmission Risk Interventions ?   ? View : No data to display.  ?  ?  ?  ? ? ? ?

## 2021-12-21 NOTE — Progress Notes (Signed)
Patient has never had a foley, no foley in patient at beginning of shift or now ?

## 2021-12-21 NOTE — Progress Notes (Signed)
Two calls to attempt to reach dr Aldean Baker at [phone] and answering service at [phone] and message left to call me pt refusing to sign consent till she speaks with the doctor about the procedure , answering service will page on call ortho for this situation tonight.  ?

## 2021-12-21 NOTE — Anesthesia Postprocedure Evaluation (Signed)
Anesthesia Post Note ? ?Patient: Marilyn [name] ? ?Procedure(s) Performed: LEFT SHOULDER DEBRIDEMENT (Left) ? ?  ? ?Patient location during evaluation: PACU ?Anesthesia Type: General ?Level of consciousness: awake and alert ?Pain management: pain level controlled ?Vital Signs Assessment: post-procedure vital signs reviewed and stable ?Respiratory status: spontaneous breathing, nonlabored ventilation, respiratory function stable and patient connected to nasal cannula oxygen ?Cardiovascular status: blood pressure returned to baseline and stable ?Postop Assessment: no apparent nausea or vomiting ?Anesthetic complications: no ? ? ?No notable events documented. ? ?Last Vitals:  ?Vitals:  ? 12/21/21 2003 12/21/21 2004  ?BP:  137/75  ?Pulse: 75 73  ?Resp: 20 14  ?Temp:  36.6 ?C  ?SpO2: 95% 95%  ?  ?Last Pain:  ?Vitals:  ? 12/21/21 2004  ?TempSrc: Oral  ?PainSc:   ? ? ?  ?  ?  ?  ?  ?  ? ?Leyda Vanderwerf S ? ? ? ? ?

## 2021-12-21 NOTE — Progress Notes (Signed)
PT Cancellation Note ? ?Patient Details ?Name: Marilyn [name] ?MRN: [id number] ?DOB: [birth year] ? ? ?Cancelled Treatment:    Reason Eval/Treat Not Completed: Pain limiting ability to participate. Pt requesting PT return later as able. Will follow-up later as time permits. ? ? ?Raymond Gurney, PT, DPT ?Acute Rehabilitation Services  ?Pager: [phone] ?Office: [phone] ? ? ? ?[name] ?12/21/2021, 9:49 AM ? ? ?

## 2021-12-21 NOTE — H&P (View-Only) (Signed)
Patient ID: ALPA SCHEHR, female   DOB: 12/20/1970, 51 y.o.   MRN: [id number] ?Postoperative day 1 status post debridement infection of the surgical wound dehiscence from total shoulder arthroplasty on the left.  Wound margins are clear there is an installation wound VAC functioning well.  We will plan for return to the operating room tomorrow with further debridement and wound closure.  Anticipate patient can discharge on Saturday with a Prevena incisional VAC.  Tissue cultures are negative. ?

## 2021-12-21 NOTE — Progress Notes (Signed)
Patient ID: Marilyn [name], female   DOB: 1971/01/06, 51 y.o.   MRN: [id number] ?Postoperative day 1 status post debridement infection of the surgical wound dehiscence from total shoulder arthroplasty on the left.  Wound margins are clear there is an installation wound VAC functioning well.  We will plan for return to the operating room tomorrow with further debridement and wound closure.  Anticipate patient can discharge on Saturday with a Prevena incisional VAC.  Tissue cultures are negative. ?

## 2021-12-21 NOTE — Evaluation (Signed)
Physical Therapy Evaluation ?Patient Details ?Name: Marilyn [name] ?MRN: [id number] ?DOB: 1970-10-02 ?Today's Date: 12/21/2021 ? ?History of Present Illness ? Pt is a 51 y.o. female who presented 12/17/21 with increased L shoulder pain. Pt with hx of left shoulder OA, now s/p left reverse shoulder arthroplasty 11/16/2021 with subsequent I&D of superficial infection 12/01/2021 and repeat I&D with wound closure 12/06/2021. CT of L shoulder now concerning for anterior L shoulder abscess. S/p L shoulder debridement 4/5. Plan for debdridement and closure of wound 4/7. PMH: anemia, asthma, depression, HTN, hypothyroidism ?  ?Clinical Impression ? Pt presents with condition above and deficits mentioned below, see PT Problem List. Prior to her left reverse shoulder arthroplasty 11/16/2021, she was mod I for ADLs and mobility using her hurrycane. After her surgery, she has been needing some assistance for ADLs. She lives with her parents in a 1-level house with a ramp entrance. Currently, pt displays deficits in activity tolerance, balance, and L shoulder strength, stability, and ROM s/p left reverse shoulder arthroplasty 11/16/2021. Pt became very SOB following the gait bout this date, needing several minutes to sit and rest with 2 L of supplemental O2 provided via [place] to recover. She would benefit from getting an ambulatory sat measurement. Currently, pt is requiring up to [facility] for bed mobility and min guard assist for transfers and gait using her hurrycane. Pt would benefit from follow-up with either Outpatient PT or Outpatient OT to rehab her shoulder after her left reverse shoulder arthroplasty 11/16/2021. At this time, she does not appear to need both PT and OT at follow-up. Will continue to follow acutely. ?   ? ?Recommendations for follow up therapy are one component of a multi-disciplinary discharge planning process, led by the attending physician.  Recommendations may be updated based on patient status, additional functional  criteria and insurance authorization. ? ?Follow Up Recommendations Outpatient PT (or Outpatient OT to rehab shoulder after replacement; does not need both PT and OT) ? ?  ?Assistance Recommended at Discharge Intermittent Supervision/Assistance  ?Patient can return home with the following ? A little help with bathing/dressing/bathroom;Assistance with cooking/housework;Assist for transportation ? ?  ?Equipment Recommendations None recommended by PT  ?Recommendations for Other Services ? OT consult  ?  ?Functional Status Assessment Patient has had a recent decline in their functional status and demonstrates the ability to make significant improvements in function in a reasonable and predictable amount of time.  ? ?  ?Precautions / Restrictions Precautions ?Precautions: Shoulder ?Type of Shoulder Precautions: ok for L pendulums, AROM elbow to hand, ER 0-30 (per Dr. Sharol Given 12/21/21) ?Shoulder Interventions: Shoulder sling/immobilizer;Off for dressing/bathing/exercises ?Precaution Booklet Issued: No (per chart, provided last admission) ?Precaution Comments: wound vac L shoulder; watch SpO2 ?Required Braces or Orthoses: Sling ?Restrictions ?Weight Bearing Restrictions: Yes ?LUE Weight Bearing: Non weight bearing  ? ?  ? ?Mobility ? Bed Mobility ?Overal bed mobility: Needs Assistance ?Bed Mobility: Supine to Sit ?  ?  ?Supine to sit: Mod assist, HOB elevated ?  ?  ?General bed mobility comments: Pt's daughter providing significant physical assistance, presumably modA to ascend trunk to sit EOB, HOB elevated. ?  ? ?Transfers ?Overall transfer level: Needs assistance ?Equipment used:  (hurrycane) ?Transfers: Sit to/from Stand ?Sit to Stand: Min guard ?  ?  ?  ?  ?  ?General transfer comment: Min guard for safety, no LOB, increased time pushing up on hurrycane to power up to stand from EOB multiple times. ?  ? ?Ambulation/Gait ?Ambulation/Gait assistance: Min guard ?  Gait Distance (Feet): 280 Feet ?Assistive device:   (hurrycane) ?Gait Pattern/deviations: Step-through pattern, Decreased stride length, Wide base of support ?Gait velocity: reduced ?Gait velocity interpretation: 1.31 - 2.62 ft/sec, indicative of limited community ambulator ?  ?General Gait Details: Pt with short, wide steps, likely baseline secondary to increased body habitus. Pt appeared to be breathing well on RA during gait bout, but once sat to rest end of session she began to show SOB, SPO2 not reading until several min after sitting with SpO2 in 90s% on 2L O2. ? ?Stairs ?  ?  ?  ?  ?  ? ?Wheelchair Mobility ?  ? ?Modified Rankin (Stroke Patients Only) ?  ? ?  ? ?Balance Overall balance assessment: Needs assistance ?Sitting-balance support: No upper extremity supported, Feet supported ?Sitting balance-Leahy Scale: Good ?  ?  ?Standing balance support: Single extremity supported, No upper extremity supported, During functional activity ?Standing balance-Leahy Scale: Fair ?Standing balance comment: Able to stand without UE support, benefits from 1 UE support on cane for improved stability with gait ?  ?  ?  ?  ?  ?  ?  ?  ?  ?  ?  ?   ? ? ? ?Pertinent Vitals/Pain Pain Assessment ?Pain Assessment: Faces ?Faces Pain Scale: Hurts even more ?Pain Location: L shoulder ?Pain Descriptors / Indicators: Discomfort, Grimacing, Operative site guarding ?Pain Intervention(s): Monitored during session, Limited activity within patient's tolerance, Repositioned, Premedicated before session  ? ? ?Home Living Family/patient expects to be discharged to:: Private residence ?Living Arrangements: Parent ?Available Help at Discharge: Family;Available 24 hours/day ?Type of Home: House ?Home Access: Ramped entrance ?  ?  ?  ?Home Layout: One level ?Home Equipment: Kasandra Knudsen - single point;Shower seat;BSC/3in1;Grab bars - tub/shower;Hand held shower head;Other (comment) (hurrycane) ?   ?  ?Prior Function Prior Level of Function : Independent/Modified Independent ?  ?  ?  ?  ?  ?  ?Mobility  Comments: Walks with hurrycane. x1 fall recently in which pt slid out of chair. ?ADLs Comments: Prior to shoulder replacement 11/16/21, she was mod I; sits to shower, mostly wears dresses and slip on shoes; needed assistance for dressing, bathing, hair management etc following her shoulder replacement 11/16/21 ?  ? ? ?Hand Dominance  ?   ? ?  ?Extremity/Trunk Assessment  ? Upper Extremity Assessment ?Upper Extremity Assessment: Defer to OT evaluation (L shoulder limited to pendulums per MD order; [facility] AROM of elbow and wrist/hand noted for tasks performed this date) ?  ? ?Lower Extremity Assessment ?Lower Extremity Assessment: Overall WFL for tasks assessed ?  ? ?Cervical / Trunk Assessment ?Cervical / Trunk Assessment: Other exceptions ?Cervical / Trunk Exceptions: increased body habitus  ?Communication  ? Communication: No difficulties  ?Cognition Arousal/Alertness: Awake/alert ?Behavior During Therapy: [facility] for tasks assessed/performed ?Overall Cognitive Status: Within Functional Limits for tasks assessed ?  ?  ?  ?  ?  ?  ?  ?  ?  ?  ?  ?  ?  ?  ?  ?  ?  ?  ?  ? ?  ?General Comments General comments (skin integrity, edema, etc.): Unable to get SpO2 reading until several min sitting after gait bout on 2 L, with it reading 90s% ? ?  ?Exercises    ? ?Assessment/Plan  ?  ?PT Assessment Patient needs continued PT services  ?PT Problem List Decreased strength;Decreased activity tolerance;Decreased range of motion;Decreased balance;Decreased mobility;Cardiopulmonary status limiting activity;Obesity;Pain ? ?   ?  ?PT Treatment  Interventions DME instruction;Gait training;Stair training;Functional mobility training;Therapeutic exercise;Therapeutic activities;Balance training;Neuromuscular re-education;Patient/family education   ? ?PT Goals (Current goals can be found in the Care Plan section)  ?Acute Rehab PT Goals ?Patient Stated Goal: to get better ?PT Goal Formulation: With patient/family ?Time For Goal Achievement:  01/04/22 ?Potential to Achieve Goals: Good ? ?  ?Frequency Min 3X/week ?  ? ? ?Co-evaluation   ?  ?  ?  ?  ? ? ?  ?AM-PAC PT "6 Clicks" Mobility  ?Outcome Measure Help needed turning from your back to your side while in a flat b

## 2021-12-21 NOTE — Care Management Important Message (Signed)
Important Message ? ?Patient Details  ?Name: Marilyn [name] ?MRN: [id number] ?Date of Birth: 02/05/1971 ? ? ?Medicare Important Message Given:  Yes ? ? ? ? ?Lucelia Lacey ?12/21/2021, 2:09 PM ?

## 2021-12-21 NOTE — Care Management Important Message (Signed)
Important Message ? ?Patient Details  ?Name: QUEENIE PIER ?MRN: [id number] ?Date of Birth: [birth year] ? ? ?Medicare Important Message Given:  Yes ? ? ? ? ?Darrol Brandenburg ?12/21/2021, 2:07 PM ?

## 2021-12-22 ENCOUNTER — Other Ambulatory Visit: Payer: Self-pay

## 2021-12-22 ENCOUNTER — Encounter (HOSPITAL_COMMUNITY): Payer: Self-pay | Admitting: Internal Medicine

## 2021-12-22 ENCOUNTER — Other Ambulatory Visit (HOSPITAL_COMMUNITY): Payer: Self-pay

## 2021-12-22 ENCOUNTER — Encounter (HOSPITAL_COMMUNITY)
Admission: EM | Disposition: A | Discharge status: Home / Self Care | Payer: Self-pay | Source: Home / Self Care | Attending: Internal Medicine

## 2021-12-22 ENCOUNTER — Inpatient Hospital Stay (HOSPITAL_COMMUNITY): Payer: Medicare Other | Admitting: Anesthesiology

## 2021-12-22 DIAGNOSIS — Z9989 Dependence on other enabling machines and devices: Secondary | ICD-10-CM

## 2021-12-22 DIAGNOSIS — G4733 Obstructive sleep apnea (adult) (pediatric): Secondary | ICD-10-CM

## 2021-12-22 DIAGNOSIS — T8149XA Infection following a procedure, other surgical site, initial encounter: Secondary | ICD-10-CM | POA: Diagnosis not present

## 2021-12-22 DIAGNOSIS — I10 Essential (primary) hypertension: Secondary | ICD-10-CM

## 2021-12-22 DIAGNOSIS — L02414 Cutaneous abscess of left upper limb: Secondary | ICD-10-CM

## 2021-12-22 HISTORY — PX: I & D EXTREMITY: SHX5045

## 2021-12-22 LAB — GLUCOSE, CAPILLARY: Glucose-Capillary: 104 mg/dL — ABNORMAL HIGH (ref 70–99)

## 2021-12-22 SURGERY — IRRIGATION AND DEBRIDEMENT EXTREMITY
Anesthesia: General | Site: Shoulder | Laterality: Left

## 2021-12-22 MED ORDER — METHOCARBAMOL 500 MG PO TABS: 500.0000 mg | ORAL_TABLET | Freq: Four times a day (QID) | ORAL | Status: DC | PRN

## 2021-12-22 MED ORDER — BISACODYL 10 MG RE SUPP: 10.0000 mg | Freq: Every day | RECTAL | Status: DC | PRN

## 2021-12-22 MED ORDER — LIDOCAINE 2% (20 MG/ML) 5 ML SYRINGE
INTRAMUSCULAR | Status: DC | PRN
  Administered 2021-12-22: 100 mg via INTRAVENOUS

## 2021-12-22 MED ORDER — POLYETHYLENE GLYCOL 3350 17 G PO PACK: 17.0000 g | PACK | Freq: Every day | ORAL | Status: DC | PRN

## 2021-12-22 MED ORDER — PHENYLEPHRINE HCL-NACL 20-0.9 MG/250ML-% IV SOLN
INTRAVENOUS | Status: DC | PRN
  Administered 2021-12-22: 30 ug/min via INTRAVENOUS

## 2021-12-22 MED ORDER — HYDROMORPHONE HCL 1 MG/ML IJ SOLN
INTRAMUSCULAR | Status: AC
Start: 2021-12-22 — End: 2021-12-23
  Filled 2021-12-22: qty 1

## 2021-12-22 MED ORDER — DOXYCYCLINE HYCLATE 100 MG PO TABS
100.0000 mg | ORAL_TABLET | Freq: Two times a day (BID) | ORAL | 0 refills | Status: DC
  Filled 2021-12-22: qty 60, 30d supply, fill #0

## 2021-12-22 MED ORDER — OXYCODONE HCL 5 MG PO TABS: 5.0000 mg | ORAL_TABLET | Freq: Once | ORAL | Status: DC | PRN

## 2021-12-22 MED ORDER — CHLORHEXIDINE GLUCONATE 0.12 % MT SOLN: 15.0000 mL | Freq: Once | OROMUCOSAL | Status: AC

## 2021-12-22 MED ORDER — DOXYCYCLINE HYCLATE 100 MG PO TABS
100.0000 mg | ORAL_TABLET | Freq: Two times a day (BID) | ORAL | Status: DC
  Administered 2021-12-22 – 2021-12-23 (×2): 100 mg via ORAL
  Filled 2021-12-22 (×2): qty 1

## 2021-12-22 MED ORDER — ONDANSETRON HCL 4 MG PO TABS: 4.0000 mg | ORAL_TABLET | Freq: Four times a day (QID) | ORAL | Status: DC | PRN

## 2021-12-22 MED ORDER — PROPOFOL 10 MG/ML IV BOLUS
INTRAVENOUS | Status: DC | PRN
  Administered 2021-12-22: 300 mg via INTRAVENOUS

## 2021-12-22 MED ORDER — DOCUSATE SODIUM 100 MG PO CAPS
100.0000 mg | ORAL_CAPSULE | Freq: Two times a day (BID) | ORAL | Status: DC
  Administered 2021-12-22 – 2021-12-23 (×2): 100 mg via ORAL
  Filled 2021-12-22 (×2): qty 1

## 2021-12-22 MED ORDER — ACETAMINOPHEN 500 MG PO TABS
1000.0000 mg | ORAL_TABLET | Freq: Once | ORAL | Status: DC
Start: 2021-12-22 — End: 2021-12-23

## 2021-12-22 MED ORDER — DEXAMETHASONE SODIUM PHOSPHATE 10 MG/ML IJ SOLN
INTRAMUSCULAR | Status: AC
  Filled 2021-12-22: qty 1

## 2021-12-22 MED ORDER — PHENYLEPHRINE 40 MCG/ML (10ML) SYRINGE FOR IV PUSH (FOR BLOOD PRESSURE SUPPORT)
PREFILLED_SYRINGE | INTRAVENOUS | Status: AC
  Filled 2021-12-22: qty 10

## 2021-12-22 MED ORDER — METOCLOPRAMIDE HCL 5 MG/ML IJ SOLN: 5.0000 mg | Freq: Three times a day (TID) | INTRAMUSCULAR | Status: DC | PRN

## 2021-12-22 MED ORDER — MIDAZOLAM HCL 2 MG/2ML IJ SOLN
INTRAMUSCULAR | Status: DC | PRN
  Administered 2021-12-22: 2 mg via INTRAVENOUS

## 2021-12-22 MED ORDER — DEXAMETHASONE SODIUM PHOSPHATE 10 MG/ML IJ SOLN
INTRAMUSCULAR | Status: DC | PRN
  Administered 2021-12-22: 10 mg via INTRAVENOUS

## 2021-12-22 MED ORDER — ONDANSETRON HCL 4 MG/2ML IJ SOLN
INTRAMUSCULAR | Status: DC | PRN
  Administered 2021-12-22: 4 mg via INTRAVENOUS

## 2021-12-22 MED ORDER — ONDANSETRON HCL 4 MG/2ML IJ SOLN
INTRAMUSCULAR | Status: AC
  Filled 2021-12-22: qty 2

## 2021-12-22 MED ORDER — METOCLOPRAMIDE HCL 10 MG PO TABS: 5.0000 mg | ORAL_TABLET | Freq: Three times a day (TID) | ORAL | Status: DC | PRN

## 2021-12-22 MED ORDER — ORAL CARE MOUTH RINSE: 15.0000 mL | Freq: Once | OROMUCOSAL | Status: AC

## 2021-12-22 MED ORDER — ONDANSETRON HCL 4 MG/2ML IJ SOLN: 4.0000 mg | Freq: Once | INTRAMUSCULAR | Status: DC | PRN

## 2021-12-22 MED ORDER — FENTANYL CITRATE (PF) 250 MCG/5ML IJ SOLN
INTRAMUSCULAR | Status: DC | PRN
  Administered 2021-12-22: 50 ug via INTRAVENOUS
  Administered 2021-12-22: 100 ug via INTRAVENOUS
  Administered 2021-12-22 (×2): 50 ug via INTRAVENOUS

## 2021-12-22 MED ORDER — OXYCODONE HCL 5 MG/5ML PO SOLN: 5.0000 mg | Freq: Once | ORAL | Status: DC | PRN

## 2021-12-22 MED ORDER — LACTATED RINGERS IV SOLN: INTRAVENOUS | Status: DC

## 2021-12-22 MED ORDER — AMISULPRIDE (ANTIEMETIC) 5 MG/2ML IV SOLN: 10.0000 mg | Freq: Once | INTRAVENOUS | Status: DC | PRN

## 2021-12-22 MED ORDER — METHOCARBAMOL 1000 MG/10ML IJ SOLN
500.0000 mg | Freq: Four times a day (QID) | INTRAVENOUS | Status: DC | PRN
  Filled 2021-12-22: qty 5

## 2021-12-22 MED ORDER — SODIUM CHLORIDE 0.9 % IV SOLN: INTRAVENOUS | Status: DC

## 2021-12-22 MED ORDER — 0.9 % SODIUM CHLORIDE (POUR BTL) OPTIME
TOPICAL | Status: DC | PRN
  Administered 2021-12-22: 1000 mL

## 2021-12-22 MED ORDER — LIDOCAINE 2% (20 MG/ML) 5 ML SYRINGE
INTRAMUSCULAR | Status: AC
  Filled 2021-12-22: qty 5

## 2021-12-22 MED ORDER — MIDAZOLAM HCL 2 MG/2ML IJ SOLN
INTRAMUSCULAR | Status: AC
  Filled 2021-12-22: qty 2

## 2021-12-22 MED ORDER — PHENYLEPHRINE 40 MCG/ML (10ML) SYRINGE FOR IV PUSH (FOR BLOOD PRESSURE SUPPORT)
PREFILLED_SYRINGE | INTRAVENOUS | Status: DC | PRN
  Administered 2021-12-22 (×2): 80 ug via INTRAVENOUS

## 2021-12-22 MED ORDER — CHLORHEXIDINE GLUCONATE 0.12 % MT SOLN
OROMUCOSAL | Status: AC
  Administered 2021-12-22: 15 mL via OROMUCOSAL
  Filled 2021-12-22: qty 15

## 2021-12-22 MED ORDER — FENTANYL CITRATE (PF) 250 MCG/5ML IJ SOLN
INTRAMUSCULAR | Status: AC
  Filled 2021-12-22: qty 5

## 2021-12-22 MED ORDER — ONDANSETRON HCL 4 MG/2ML IJ SOLN
4.0000 mg | Freq: Four times a day (QID) | INTRAMUSCULAR | Status: DC | PRN
Start: 2021-12-22 — End: 2021-12-23

## 2021-12-22 MED ORDER — HYDROMORPHONE HCL 1 MG/ML IJ SOLN
0.2500 mg | INTRAMUSCULAR | Status: DC | PRN
  Administered 2021-12-22 (×2): 0.5 mg via INTRAVENOUS

## 2021-12-22 SURGICAL SUPPLY — 35 items
BAG COUNTER SPONGE SURGICOUNT (BAG) IMPLANT
BLADE SURG 21 STRL SS (BLADE) ×2 IMPLANT
CANISTER WOUNDNEG PRESSURE 500 (CANNISTER) ×1 IMPLANT
COVER SURGICAL LIGHT HANDLE (MISCELLANEOUS) ×4 IMPLANT
DERMABOND ADVANCED (GAUZE/BANDAGES/DRESSINGS) ×1
DERMABOND ADVANCED .7 DNX12 (GAUZE/BANDAGES/DRESSINGS) IMPLANT
DRAPE DERMATAC (DRAPES) ×3 IMPLANT
DRAPE U-SHAPE 47X51 STRL (DRAPES) ×2 IMPLANT
DRESSING PEEL AND PLAC PRVNA20 (GAUZE/BANDAGES/DRESSINGS) IMPLANT
DRSG ADAPTIC 3X8 NADH LF (GAUZE/BANDAGES/DRESSINGS) ×2 IMPLANT
DRSG PEEL AND PLACE PREVENA 20 (GAUZE/BANDAGES/DRESSINGS) ×2
DURAPREP 26ML APPLICATOR (WOUND CARE) ×2 IMPLANT
ELECT REM PT RETURN 9FT ADLT (ELECTROSURGICAL)
ELECTRODE REM PT RTRN 9FT ADLT (ELECTROSURGICAL) IMPLANT
GAUZE SPONGE 4X4 12PLY STRL (GAUZE/BANDAGES/DRESSINGS) ×2 IMPLANT
GLOVE SURG ORTHO LTX SZ9 (GLOVE) ×2 IMPLANT
GLOVE SURG UNDER POLY LF SZ9 (GLOVE) ×2 IMPLANT
GOWN STRL REUS W/ TWL XL LVL3 (GOWN DISPOSABLE) ×2 IMPLANT
GOWN STRL REUS W/TWL XL LVL3 (GOWN DISPOSABLE) ×4
GRAFT SKIN WND MICRO 38 (Tissue) ×1 IMPLANT
HANDPIECE INTERPULSE COAX TIP (DISPOSABLE)
KIT BASIN OR (CUSTOM PROCEDURE TRAY) ×2 IMPLANT
KIT TURNOVER KIT B (KITS) ×2 IMPLANT
MANIFOLD NEPTUNE II (INSTRUMENTS) ×2 IMPLANT
NS IRRIG 1000ML POUR BTL (IV SOLUTION) ×2 IMPLANT
PACK ORTHO EXTREMITY (CUSTOM PROCEDURE TRAY) ×2 IMPLANT
PAD ARMBOARD 7.5X6 YLW CONV (MISCELLANEOUS) ×4 IMPLANT
SET HNDPC FAN SPRY TIP SCT (DISPOSABLE) IMPLANT
STOCKINETTE IMPERVIOUS 9X36 MD (GAUZE/BANDAGES/DRESSINGS) IMPLANT
SUT ETHILON 2 0 PSLX (SUTURE) ×6 IMPLANT
SWAB COLLECTION DEVICE MRSA (MISCELLANEOUS) ×2 IMPLANT
SWAB CULTURE ESWAB REG 1ML (MISCELLANEOUS) IMPLANT
TOWEL GREEN STERILE (TOWEL DISPOSABLE) ×2 IMPLANT
TUBE CONNECTING 12X1/4 (SUCTIONS) ×2 IMPLANT
YANKAUER SUCT BULB TIP NO VENT (SUCTIONS) ×2 IMPLANT

## 2021-12-22 NOTE — Progress Notes (Signed)
OT Cancellation Note ? ?Patient Details ?Name: Marilyn [name] ?MRN: [id number] ?DOB: 1971-02-21 ? ? ?Cancelled Treatment:    Reason Eval/Treat Not Completed: Patient at procedure or test/ unavailable. Patient to OR at time of eval. Will check back 4/8 as schedule permits. ? ?[name] OT ?OT pager: [phone] ? ? ?Carmelia Roller ?12/22/2021, 11:45 AM ?

## 2021-12-22 NOTE — Interval H&P Note (Signed)
History and Physical Interval Note: ? ?12/22/2021 ?6:43 AM ? ?Marilyn [name]  has presented today for surgery, with the diagnosis of Abscess Left Shoulder.  The various methods of treatment have been discussed with the patient and family. After consideration of risks, benefits and other options for treatment, the patient has consented to  Procedure(s): ?LEFT SHOULDER DEBRIDEMENT (Left) as a surgical intervention.  The patient's history has been reviewed, patient examined, no change in status, stable for surgery.  I have reviewed the patient's chart and labs.  Questions were answered to the patient's satisfaction.   ? ? ?[name] ? ? ?

## 2021-12-22 NOTE — Anesthesia Postprocedure Evaluation (Signed)
Anesthesia Post Note ? ?Patient: Marilyn [name] ? ?Procedure(s) Performed: LEFT SHOULDER DEBRIDEMENT (Left: Shoulder) ? ?  ? ?Patient location during evaluation: PACU ?Anesthesia Type: General ?Level of consciousness: awake and alert, oriented and patient cooperative ?Pain management: pain level controlled ?Vital Signs Assessment: post-procedure vital signs reviewed and stable ?Respiratory status: spontaneous breathing, nonlabored ventilation and respiratory function stable ?Cardiovascular status: blood pressure returned to baseline and stable ?Postop Assessment: no apparent nausea or vomiting ?Anesthetic complications: no ? ? ?No notable events documented. ? ?Last Vitals:  ?Vitals:  ? 12/22/21 0740 12/22/21 1049  ?BP: (!) 116/58 (!) 128/50  ?Pulse: 68 70  ?Resp: 16 18  ?Temp: 36.6 ?C 37 ?C  ?SpO2: 92% 98%  ?  ?Last Pain:  ?Vitals:  ? 12/22/21 1049  ?TempSrc: Oral  ?PainSc:   ? ? ?  ?  ?  ?  ?  ?  ? ?Jarome Matin Zailee Vallely ? ? ? ? ?

## 2021-12-22 NOTE — Anesthesia Preprocedure Evaluation (Addendum)
Anesthesia Evaluation  ?Patient identified by MRN, date of birth, ID band ?Patient awake ? ? ? ?Reviewed: ?Allergy & Precautions, NPO status , Patient's Chart, lab work & pertinent test results ? ?Airway ?Mallampati: III ? ?TM Distance: >3 FB ?Neck ROM: Full ? ? ? Dental ? ?(+) Dental Advisory Given ?  ?Pulmonary ?asthma , sleep apnea and Continuous Positive Airway Pressure Ventilation , former smoker,  ?Quit smoking 1989 ?  ?Pulmonary exam normal ?breath sounds clear to auscultation ? ? ? ? ? ? Cardiovascular ?hypertension, Pt. on medications ?Normal cardiovascular exam ?Rhythm:Regular Rate:Normal ? ?Echo 05/2019 ??1. The left ventricle has normal systolic function with an ejection  ?fraction of 60-65%. The cavity size was normal. There is mildly increased  ?left ventricular wall thickness. Left ventricular diastolic Doppler  ?parameters are consistent with impaired  ?relaxation.  ??2. The right ventricle has normal systolic function. The cavity was  ?normal. There is no increase in right ventricular wall thickness. Right  ?ventricular systolic pressure could not be assessed.  ??3. The aortic valve is tricuspid. Mild aortic annular calcification  ?noted.  ??4. The mitral valve is grossly normal.  ??5. The tricuspid valve is grossly normal.  ??6. The aorta is normal unless otherwise noted.  ??7. The interatrial septum was not well visualized.  ?  ?Neuro/Psych ? Headaches, PSYCHIATRIC DISORDERS Anxiety Depression   ? GI/Hepatic ?Neg liver ROS, GERD  Controlled,  ?Endo/Other  ?Hypothyroidism Morbid obesityBMI 63 ? Renal/GU ?Renal InsufficiencyRenal diseaseCr 1.5  ?negative genitourinary ?  ?Musculoskeletal ? ?(+) Arthritis , Osteoarthritis,   ? Abdominal ?(+) + obese,   ?Peds ? Hematology ? ?(+) Blood dyscrasia, anemia , hb 8.7   ?Anesthesia Other Findings ?Percocet 10/325-  ? ?L shoulder wound/abscess after L total shoulder 11/16/21 ? Reproductive/Obstetrics ?negative OB ROS ? ?   ? ? ? ? ? ? ? ? ? ? ? ? ? ?  ?  ? ? ? ? ? ? ? ?Anesthesia Physical ?Anesthesia Plan ? ?ASA: 3 ? ?Anesthesia Plan: General  ? ?Post-op Pain Management: Tylenol PO (pre-op)*  ? ?Induction: Intravenous ? ?PONV Risk Score and Plan: 3 and Ondansetron, Dexamethasone, Midazolam and Treatment may vary due to age or medical condition ? ?Airway Management Planned: Oral ETT ? ?Additional Equipment: None ? ?Intra-op Plan:  ? ?Post-operative Plan: Extubation in OR ? ?Informed Consent: I have reviewed the patients History and Physical, chart, labs and discussed the procedure including the risks, benefits and alternatives for the proposed anesthesia with the patient or authorized representative who has indicated his/her understanding and acceptance.  ? ? ? ?Dental advisory given ? ?Plan Discussed with: CRNA ? ?Anesthesia Plan Comments: (Has had multiple shoulder debridements in the past weeks, no airway issues ? ?Last airway note: Ventilation: Mask ventilation without difficulty ?Laryngoscope Size: Sabra Heck and 3 ?Grade View: Grade I ?Tube type: Oral ?Tube size: 7.0 mm ?Number of attempts: 1 ?Airway Equipment and Method: Stylet)  ? ? ? ? ? ?Anesthesia Quick Evaluation ? ?

## 2021-12-22 NOTE — Anesthesia Procedure Notes (Signed)
Procedure Name: LMA Insertion ?Date/Time: 12/22/2021 12:38 PM ?Performed by: Maxine Glenn, CRNA ?Pre-anesthesia Checklist: Patient identified, Emergency Drugs available, Suction available and Patient being monitored ?Patient Re-evaluated:Patient Re-evaluated prior to induction ?Oxygen Delivery Method: Circle System Utilized ?Preoxygenation: Pre-oxygenation with 100% oxygen ?Induction Type: IV induction ?Ventilation: Mask ventilation without difficulty ?LMA: LMA inserted ?LMA Size: 5.0 ?Number of attempts: 2 ?Airway Equipment and Method: Bite block ?Placement Confirmation: positive ETCO2 ?Tube secured with: Tape ?Dental Injury: Teeth and Oropharynx as per pre-operative assessment  ?Comments: LMA 4 placed initially, tidal volumes 100s-200s. Switched to LMA 5, with tidal volumes into 600s.  ? ? ? ? ?

## 2021-12-22 NOTE — Progress Notes (Signed)
?PROGRESS NOTE ? ? ? ?Marilyn [name]  [id number] DOB: 1971-06-04 DOA: 12/17/2021 ?PCP: Bridget Hartshorn, NP  ? ? ?Brief Narrative:  ?51 year old female with history of morbid obesity, obstructive sleep apnea on CPAP, anxiety/depression/GERD, hypothyroidism and hypertension, left reverse shoulder arthroplasty on 3/2 with subsequent I&D of superficial infection 3/17, repeat I&D and wound closure 3/22 admitted with worsening left shoulder pain and drainage.  Hemodynamically stable.  Painful.  Admitted with postop infection.  Started on vancomycin and Zosyn. ? ? ?Assessment & Plan: ?  ?Postop wound infection with recent shoulder arthroplasty: ?Debridement and wound VAC placement 4/5, tissue culture negative.  Blood cultures negative. ?Debridement and wound closure with wound VAC placement for incision 4/7. ?Received multiple antibiotics as outpatient. ?Surgically no deep tissue infection and no hardware infection. ?Currently on vancomycin and Zosyn. ?Discontinue Vanco Zosyn, will treat with doxycycline for 1 month as recommended by orthopedics.  Adequate pain medications. ?Work with PT OT today to ensure safe transition home.  Anticipate home tomorrow. ? ?Sleep apnea: Using CPAP.  Occasional shortness of breath with pain, multiple thromboembolic studies negative including CTA and lower extremity duplexes. ? ?Chronic intermittent asthma: Stable.  Albuterol as needed. ? ?Hypothyroidism: Euvolemic on Synthroid. ? ?Essential hypertension: Blood pressure stable on hydrochlorothiazide. ? ?Hypokalemia/hypomagnesemia: On a scheduled replacement.  Adequate. ? ?Anxiety/depression: On Cymbalta and gabapentin were continued. ? ? ?DVT prophylaxis: SCDs Start: 12/20/21 2105  Lovenox subcu. ? ? ?Code Status: Full code ?Family Communication: Daughter at the bedside. ?Disposition Plan: Status is: Inpatient ?Remains inpatient appropriate because: Inpatient surgery today.  PT OT before discharge. ?  ? ? ?Consultants:   ?Orthopedics ? ?Procedures:  ?None ? ?Antimicrobials:  ?Vancomycin Zosyn 4/2--- 4/7. ?Doxycycline 4/7--- ? ? ?Subjective: ? ?I examined patient in the morning rounds before going to surgery.  Her wound VAC was creating intermittent suction and patient was screaming with pain.  I disconnected the wound VAC and patient felt better.  Patient went to surgery with closure of wound.  She has not mobilized for safe disposition yet. ? ?Objective: ?Vitals:  ? 12/22/21 1318 12/22/21 1337 12/22/21 1348 12/22/21 1403  ?BP: (!) 159/88 (!) 159/95 (!) 154/65 (!) 157/75  ?Pulse: 69 70 72 69  ?Resp: 12 12 13 12   ?Temp: 97.9 ?F (36.6 ?C)   97.9 ?F (36.6 ?C)  ?TempSrc:      ?SpO2: 98% 94% 100% 98%  ?Weight:      ?Height:      ? ? ?Intake/Output Summary (Last 24 hours) at 12/22/2021 1520 ?Last data filed at 12/22/2021 1348 ?Gross per 24 hour  ?Intake 300 ml  ?Output 120 ml  ?Net 180 ml  ? ?Filed Weights  ? 12/18/21 [id number] 12/20/21 0906 12/22/21 1049  ?Weight: (!) 169.3 kg (!) 162.4 kg (!) 167.8 kg  ? ? ?Examination: ? ?General exam: Looks moderate distress due to pain. ?Respiratory system: Clear to auscultation. Respiratory effort normal.  On room air. ?Cardiovascular system: S1 & S2 heard, RRR.   ?Gastrointestinal system: Abdomen is nondistended, soft and nontender. No organomegaly or masses felt. Normal bowel sounds heard.  Pendulous.  Nontender. ?Central nervous system: No focal deficit. ?Left shoulder with surgical dressing, wound VAC dressing present.  Wound VAC with clear serosanguineous drainage in the canister. ? ? ? ? ?Data Reviewed: I have personally reviewed following labs and imaging studies ? ?CBC: ?Recent Labs  ?Lab 12/17/21 ?2149 12/19/21 ?0308  ?WBC 6.3 7.0  ?NEUTROABS 3.7 4.3  ?HGB 9.7* 8.7*  ?HCT 32.4* 28.0*  ?  MCV 95.3 92.1  ?PLT 300 247  ? ?Basic Metabolic Panel: ?Recent Labs  ?Lab 12/17/21 ?2214 12/18/21 ?3846 12/19/21 ?0308 12/21/21 ?6599  ?NA 138  --  138  --   ?K 3.4*  --  3.1*  --   ?CL 102  --  101  --   ?CO2 22  --   27  --   ?GLUCOSE 111*  --  118*  --   ?BUN 8  --  9  --   ?CREATININE 1.32*  --  1.46* 1.51*  ?CALCIUM 9.3  --  8.9  --   ?MG  --  1.5* 1.6*  --   ?PHOS  --   --  3.7  --   ? ?GFR: ?Estimated Creatinine Clearance: 67.8 mL/min (A) (by C-G formula based on SCr of 1.51 mg/dL (H)). ?Liver Function Tests: ?No results for input(s): AST, ALT, ALKPHOS, BILITOT, PROT, ALBUMIN in the last 168 hours. ?No results for input(s): LIPASE, AMYLASE in the last 168 hours. ?No results for input(s): AMMONIA in the last 168 hours. ?Coagulation Profile: ?No results for input(s): INR, PROTIME in the last 168 hours. ?Cardiac Enzymes: ?No results for input(s): CKTOTAL, CKMB, CKMBINDEX, TROPONINI in the last 168 hours. ?BNP (last 3 results) ?No results for input(s): PROBNP in the last 8760 hours. ?HbA1C: ?No results for input(s): HGBA1C in the last 72 hours. ?CBG: ?Recent Labs  ?Lab 12/22/21 ?1050  ?GLUCAP 104*  ? ?Lipid Profile: ?No results for input(s): CHOL, HDL, LDLCALC, TRIG, CHOLHDL, LDLDIRECT in the last 72 hours. ?Thyroid Function Tests: ?No results for input(s): TSH, T4TOTAL, FREET4, T3FREE, THYROIDAB in the last 72 hours. ?Anemia Panel: ?No results for input(s): VITAMINB12, FOLATE, FERRITIN, TIBC, IRON, RETICCTPCT in the last 72 hours. ?Sepsis Labs: ?Recent Labs  ?Lab 12/17/21 ?2214 12/18/21 ?1125  ?LATICACIDVEN 2.7* 1.7  ? ? ?Recent Results (from the past 240 hour(s))  ?Resp Panel by RT-PCR (Flu A&B, Covid) Nasopharyngeal Swab     Status: None  ? Collection Time: 12/18/21  4:32 AM  ? Specimen: Nasopharyngeal Swab; Nasopharyngeal(NP) swabs in vial transport medium  ?Result Value Ref Range Status  ? SARS Coronavirus 2 by RT PCR NEGATIVE NEGATIVE Final  ?  Comment: (NOTE) ?SARS-CoV-2 target nucleic acids are NOT DETECTED. ? ?The SARS-CoV-2 RNA is generally detectable in upper respiratory ?specimens during the acute phase of infection. The lowest ?concentration of SARS-CoV-2 viral copies this assay can detect is ?138 copies/mL. A  negative result does not preclude SARS-Cov-2 ?infection and should not be used as the sole basis for treatment or ?other patient management decisions. A negative result may occur with  ?improper specimen collection/handling, submission of specimen other ?than nasopharyngeal swab, presence of viral mutation(s) within the ?areas targeted by this assay, and inadequate number of viral ?copies(<138 copies/mL). A negative result must be combined with ?clinical observations, patient history, and epidemiological ?information. The expected result is Negative. ? ?Fact Sheet for Patients:  ?BloggerCourse.com ? ?Fact Sheet for Healthcare Providers:  ?SeriousBroker.it ? ?This test is no t yet approved or cleared by the [place] FDA and  ?has been authorized for detection and/or diagnosis of SARS-CoV-2 by ?FDA under an Emergency Use Authorization (EUA). This EUA will remain  ?in effect (meaning this test can be used) for the duration of the ?COVID-19 declaration under Section 564(b)(1) of the Act, 21 ?U.S.C.section 360bbb-3(b)(1), unless the authorization is terminated  ?or revoked sooner.  ? ? ?  ? Influenza A by PCR NEGATIVE NEGATIVE Final  ? Influenza  B by PCR NEGATIVE NEGATIVE Final  ?  Comment: (NOTE) ?The Xpert Xpress SARS-CoV-2/FLU/RSV plus assay is intended as an aid ?in the diagnosis of influenza from Nasopharyngeal swab specimens and ?should not be used as a sole basis for treatment. Nasal washings and ?aspirates are unacceptable for Xpert Xpress SARS-CoV-2/FLU/RSV ?testing. ? ?Fact Sheet for Patients: ?EntrepreneurPulse.com.au ? ?Fact Sheet for Healthcare Providers: ?IncredibleEmployment.be ? ?This test is not yet approved or cleared by the [place] FDA and ?has been authorized for detection and/or diagnosis of SARS-CoV-2 by ?FDA under an Emergency Use Authorization (EUA). This EUA will remain ?in effect (meaning this test can  be used) for the duration of the ?COVID-19 declaration under Section 564(b)(1) of the Act, 21 U.S.C. ?section 360bbb-3(b)(1), unless the authorization is terminated or ?revoked. ? ?Performed at [facility]

## 2021-12-22 NOTE — Op Note (Signed)
12/22/2021 ? ?1:46 PM ? ?PATIENT:  Marilyn [name]   ? ?PRE-OPERATIVE DIAGNOSIS:  Abscess Left Shoulder ? ?POST-OPERATIVE DIAGNOSIS:  Same ? ?PROCEDURE:  LEFT SHOULDER DEBRIDEMENT ?Local tissue rearrangement for wound closure 20 x 5 cm. ?Application of Kerecis 38 g/cm? micro powder. ?Application of 20 cm wound VAC. ? ?SURGEON:  [name], MD ? ?PHYSICIAN ASSISTANT:None ?ANESTHESIA:   General ? ?PREOPERATIVE INDICATIONS:  Marilyn [name] is a  51 y.o. female with a diagnosis of Abscess Left Shoulder who failed conservative measures and elected for surgical management.   ? ?The risks benefits and alternatives were discussed with the patient preoperatively including but not limited to the risks of infection, bleeding, nerve injury, cardiopulmonary complications, the need for revision surgery, among others, and the patient was willing to proceed. ? ?OPERATIVE IMPLANTS: Kerecis micro powder 38 cm?. ? ?@ENCIMAGES @ ? ?OPERATIVE FINDINGS: All surface area had healthy granulation tissue no drainage. ? ?OPERATIVE PROCEDURE: Patient brought the operating room and underwent a general anesthetic.  After adequate levels anesthesia were obtained patient's left upper extremity was prepped using DuraPrep draped into a sterile field a timeout was called.  The sutures were removed the wound was debrided of skin and soft tissue the wound was extended inferiorly obtain how ? ? ?DISCHARGE PLANNING: ? ?Antibiotic duration: 3 or more viable tissue.  Electrocautery was used hemostasis wound was irrigated with normal saline.  The incisions were extended to allow for local tissue rearrangement for wound closure 20 x 5 cm with 2-0 nylon.  The 20 cm Prevena wound VAC was applied covered with derma tack and Dermabond this had a good suction fit patient was extubated taken the PACU in stable condition. ? ?Weightbearing: Range of motion of the shoulder per therapy no restrictions with weightbearing ? ?Pain medication: Continue current pain  medication ? ?Dressing care/ Wound VAC: Continue wound VAC plan for discharge on her Prevena portable wound VAC pump ? ?Ambulatory devices: Per therapy ? ?Discharge to: Tissue margins were clear cultures are negative would discharge on doxycycline 100 mg twice a day for a month. ? ?Follow-up: In the office 1 week post operative. ? ? ? ? ? ? ?  ?

## 2021-12-22 NOTE — Transfer of Care (Signed)
Immediate Anesthesia Transfer of Care Note ? ?Patient: Marilyn [name] ? ?Procedure(s) Performed: LEFT SHOULDER DEBRIDEMENT (Left: Shoulder) ? ?Patient Location: PACU ? ?Anesthesia Type:General ? ?Level of Consciousness: awake and drowsy ? ?Airway & Oxygen Therapy: Patient Spontanous Breathing ? ?Post-op Assessment: Report given to RN and Post -op Vital signs reviewed and stable ? ?Post vital signs: Reviewed and stable ? ?Last Vitals:  ?Vitals Value Taken Time  ?BP    ?Temp    ?Pulse    ?Resp    ?SpO2    ? ? ?Last Pain:  ?Vitals:  ? 12/22/21 1049  ?TempSrc: Oral  ?PainSc:   ?   ? ?Patients Stated Pain Goal: 6 (12/21/21 2219) ? ?Complications: No notable events documented. ?

## 2021-12-23 LAB — BASIC METABOLIC PANEL
Anion gap: 7 (ref 5–15)
BUN: 14 mg/dL (ref 6–20)
CO2: 25 mmol/L (ref 22–32)
Calcium: 9.3 mg/dL (ref 8.9–10.3)
Chloride: 104 mmol/L (ref 98–111)
Creatinine, Ser: 1.39 mg/dL — ABNORMAL HIGH (ref 0.44–1.00)
GFR, Estimated: 46 mL/min — ABNORMAL LOW (ref >60)
Glucose, Bld: 145 mg/dL — ABNORMAL HIGH (ref 70–99)
Potassium: 5.2 mmol/L — ABNORMAL HIGH (ref 3.5–5.1)
Sodium: 136 mmol/L (ref 135–145)

## 2021-12-23 LAB — CBC WITH DIFFERENTIAL/PLATELET
Abs Immature Granulocytes: 0.04 10*3/uL (ref 0.00–0.07)
Basophils Absolute: 0 10*3/uL (ref 0.0–0.1)
Basophils Relative: 0 %
Eosinophils Absolute: 0 10*3/uL (ref 0.0–0.5)
Eosinophils Relative: 0 %
HCT: 27.1 % — ABNORMAL LOW (ref 36.0–46.0)
Hemoglobin: 8.2 g/dL — ABNORMAL LOW (ref 12.0–15.0)
Immature Granulocytes: 1 %
Lymphocytes Relative: 19 %
Lymphs Abs: 1.4 10*3/uL (ref 0.7–4.0)
MCH: 28.1 pg (ref 26.0–34.0)
MCHC: 30.3 g/dL (ref 30.0–36.0)
MCV: 92.8 fL (ref 80.0–100.0)
Monocytes Absolute: 0.2 10*3/uL (ref 0.1–1.0)
Monocytes Relative: 2 %
Neutro Abs: 5.6 10*3/uL (ref 1.7–7.7)
Neutrophils Relative %: 78 %
Platelets: 249 10*3/uL (ref 150–400)
RBC: 2.92 MIL/uL — ABNORMAL LOW (ref 3.87–5.11)
RDW: 15.1 % (ref 11.5–15.5)
WBC: 7.1 10*3/uL (ref 4.0–10.5)
nRBC: 0 % (ref 0.0–0.2)

## 2021-12-23 LAB — MAGNESIUM: Magnesium: 2.2 mg/dL (ref 1.7–2.4)

## 2021-12-23 LAB — PHOSPHORUS: Phosphorus: 3.3 mg/dL (ref 2.5–4.6)

## 2021-12-23 MED ORDER — OXYCODONE-ACETAMINOPHEN 10-325 MG PO TABS: 1.0000 | ORAL_TABLET | ORAL | 0 refills | Status: DC | PRN

## 2021-12-23 MED ORDER — METHOCARBAMOL 500 MG PO TABS: 500.0000 mg | ORAL_TABLET | Freq: Three times a day (TID) | ORAL | 0 refills | Status: DC | PRN

## 2021-12-23 NOTE — Plan of Care (Signed)
?  Problem: Education: Goal: Knowledge of General Education information will improve Description: Including pain rating scale, medication(s)/side effects and non-pharmacologic comfort measures Outcome: Completed/Met   Problem: Health Behavior/Discharge Planning: Goal: Ability to manage health-related needs will improve Outcome: Completed/Met   Problem: Clinical Measurements: Goal: Ability to maintain clinical measurements within normal limits will improve Outcome: Completed/Met Goal: Will remain free from infection Outcome: Completed/Met Goal: Diagnostic test results will improve Outcome: Completed/Met Goal: Respiratory complications will improve Outcome: Completed/Met Goal: Cardiovascular complication will be avoided Outcome: Completed/Met   Problem: Activity: Goal: Risk for activity intolerance will decrease Outcome: Completed/Met   Problem: Nutrition: Goal: Adequate nutrition will be maintained Outcome: Completed/Met   Problem: Coping: Goal: Level of anxiety will decrease Outcome: Completed/Met   Problem: Elimination: Goal: Will not experience complications related to bowel motility Outcome: Completed/Met Goal: Will not experience complications related to urinary retention Outcome: Completed/Met   Problem: Pain Managment: Goal: General experience of comfort will improve Outcome: Completed/Met   Problem: Skin Integrity: Goal: Risk for impaired skin integrity will decrease Outcome: Completed/Met   

## 2021-12-23 NOTE — Progress Notes (Addendum)
Patient ID: Marilyn [name], female   DOB: [birth year], 51 y.o.   MRN: [id number] ?Patient resting well this morning.  The wound VAC has a good suction fit.  I connected this to the portable Praveena wound VAC pump for discharge there was a good suction fit.  Family was instructed to make sure this stays plugged then and the dressing stays dry.  Reinforce the dressing with Tegaderm as needed. ? ?I wrote the discharge prescriptions for the oxycodone and Robaxin and doxycycline. ?

## 2021-12-23 NOTE — Progress Notes (Signed)
Discharge instructions given to pt, verbalized understanding.  Prescribed medications given to pt.  D/C home stable. ?

## 2021-12-23 NOTE — Evaluation (Signed)
Occupational Therapy Evaluation ?Patient Details ?Name: Marilyn [name] ?MRN: [id number] ?DOB: [birth year] ?Today's Date: 12/23/2021 ? ? ?History of Present Illness Pt is a 51 y.o. female who presented 12/17/21 with increased L shoulder pain. Pt with hx of left shoulder OA, now s/p left reverse shoulder arthroplasty 11/16/2021 with subsequent I&D of superficial infection 12/01/2021 and repeat I&D with wound closure 12/06/2021. CT of L shoulder now concerning for anterior L shoulder abscess. S/p L shoulder debridement 4/5. Plan for debdridement and closure of wound 4/7. PMH: anemia, asthma, depression, HTN, hypothyroidism  ? ?Clinical Impression ?  ?Halona was evaluated s/p the above admission list, she is generally mod I at her baseline prior to her L reverse TSA. Upon arrival pt was dressed, per report her daughter assisted. Verbally reviewed compensatory techniques for BADLs, pt verbalized understanding. Reviewed exercises and precautions, pt tolerated well with some expected L shoulder pain. Overall she was min A for ADLs and supervision for functional ambulation. Pt has active plans to d/c home today, recommend to follow Mds orders for appropriate follow up therapies.  ?   ? ?Recommendations for follow up therapy are one component of a multi-disciplinary discharge planning process, led by the attending physician.  Recommendations may be updated based on patient status, additional functional criteria and insurance authorization.  ? ?Follow Up Recommendations ? Follow physician's recommendations for discharge plan and follow up therapies  ?  ?Assistance Recommended at Discharge Intermittent Supervision/Assistance  ?Patient can return home with the following A little help with bathing/dressing/bathroom;Assistance with cooking/housework ? ?  ?Functional Status Assessment ? Patient has had a recent decline in their functional status and demonstrates the ability to make significant improvements in function in a reasonable and  predictable amount of time.  ?Equipment Recommendations ? None recommended by OT  ?  ?Recommendations for Other Services   ? ? ?  ?Precautions / Restrictions Precautions ?Precautions: Shoulder ?Type of Shoulder Precautions: ok for L pendulums, AROM elbow to hand, ER 0-30 ?Shoulder Interventions: Shoulder sling/immobilizer;Off for dressing/bathing/exercises ?Precaution Booklet Issued: No ?Precaution Comments: wound vac L shoulder; watch SpO2 ?Required Braces or Orthoses: Sling ?Restrictions ?Weight Bearing Restrictions: Yes ?LUE Weight Bearing: Weight bearing as tolerated  ? ?  ? ?Mobility Bed Mobility ?Overal bed mobility: Needs Assistance ?  ?  ?  ?  ?  ?  ?General bed mobility comments: sitting EOB upon arrival, daughter states that she has been assisting with bed mobility ?  ? ?Transfers ?Overall transfer level: Needs assistance ?Equipment used: None ?Transfers: Sit to/from Stand ?Sit to Stand: Supervision ?  ?  ?  ?  ?  ?General transfer comment: no assist, cues for safety. sling donned ?  ? ?  ?Balance Overall balance assessment: Needs assistance ?Sitting-balance support: No upper extremity supported, Feet supported ?Sitting balance-Leahy Scale: Good ?  ?  ?Standing balance support: Single extremity supported, No upper extremity supported, During functional activity ?Standing balance-Leahy Scale: Fair ?  ?  ?  ?  ?  ?  ?  ?  ?  ?  ?  ?  ?   ? ?ADL either performed or assessed with clinical judgement  ? ?ADL Overall ADL's : Needs assistance/impaired ?Eating/Feeding: Set up;Sitting ?  ?Grooming: Supervision/safety;Standing ?  ?Upper Body Bathing: Minimal assistance;Sitting;Cueing for compensatory techniques ?Upper Body Bathing Details (indicate cue type and reason): educated in compensatory strategies ?Lower Body Bathing: Minimal assistance;Sit to/from stand ?  ?Upper Body Dressing : Minimal assistance;Sitting;Cueing for compensatory techniques ?  ?Lower Body Dressing: Minimal  assistance;Sit to/from stand ?   ?Toilet Transfer: Supervision/safety;Ambulation ?  ?Toileting- Clothing Manipulation and Hygiene: Supervision/safety;Sitting/lateral lean ?  ?  ?  ?Functional mobility during ADLs: Supervision/safety ?General ADL Comments: pt dressed upon arrival, reviewed compensatory techniques on ADLs. she verbalized good understanding. Dsughter present and assisted wtih ADls  ? ? ? ?Vision Baseline Vision/History: 1 Wears glasses ?Ability to See in Adequate Light: 0 Adequate ?Patient Visual Report: No change from baseline ?Vision Assessment?: No apparent visual deficits  ?   ?Perception   ?  ?Praxis   ?  ? ?Pertinent Vitals/Pain Pain Assessment ?Pain Assessment: Faces ?Faces Pain Scale: Hurts even more ?Pain Location: L shoulder ?Pain Descriptors / Indicators: Discomfort, Grimacing, Operative site guarding ?Pain Intervention(s): Limited activity within patient's tolerance, Monitored during session  ? ? ? ?Hand Dominance Right ?  ?Extremity/Trunk Assessment Upper Extremity Assessment ?Upper Extremity Assessment: LUE deficits/detail ?LUE Deficits / Details: Educated in shoulder pendulums, AROM elbow to hand ?LUE Sensation: WNL ?LUE Coordination: decreased gross motor ?  ?Lower Extremity Assessment ?Lower Extremity Assessment: Overall WFL for tasks assessed ?  ?Cervical / Trunk Assessment ?Cervical / Trunk Assessment: Other exceptions ?Cervical / Trunk Exceptions: increased body habitus ?  ?Communication Communication ?Communication: No difficulties ?  ?Cognition Arousal/Alertness: Awake/alert ?Behavior During Therapy: [facility] for tasks assessed/performed ?Overall Cognitive Status: Within Functional Limits for tasks assessed ?  ?  ?  ?  ?  ?  ?  ?  ?  ?  ?  ?  ?  ?  ?  ?  ?  ?  ?  ?General Comments  VSS on RA, daughter present and supportive ? ?  ?Exercises Other Exercises ?Other Exercises: Pt and daughter inquiring about shoulder CMP machine, deferred to MD/discharge paperwork ?  ?Shoulder Instructions Shoulder  Instructions ?Donning/doffing shirt without moving shoulder: Minimal assistance ?Method for sponge bathing under operated UE: Minimal assistance ?Donning/doffing sling/immobilizer: Minimal assistance ?Correct positioning of sling/immobilizer: Modified independent ?Pendulum exercises (written home exercise program): Supervision/safety ?ROM for elbow, wrist and digits of operated UE: Modified independent ?Sling wearing schedule (on at all times/off for ADL's): Supervision/safety ?Positioning of UE while sleeping: Modified independent  ? ? ?Home Living Family/patient expects to be discharged to:: Private residence ?Living Arrangements: Parent ?Available Help at Discharge: Family;Available 24 hours/day ?Type of Home: House ?Home Access: Ramped entrance ?  ?  ?Home Layout: One level ?  ?  ?Bathroom Shower/Tub: Tub/shower unit ?  ?Bathroom Toilet: Handicapped height ?  ?  ?Home Equipment: Gilmer MorCane - single point;Shower seat;BSC/3in1;Grab bars - tub/shower;Hand held shower head;Other (comment) ?  ?  ?  ? ?  ?Prior Functioning/Environment Prior Level of Function : Independent/Modified Independent ?  ?  ?  ?  ?  ?  ?Mobility Comments: Walks with hurrycane. x1 fall recently in which pt slid out of chair. ?ADLs Comments: Prior to shoulder replacement 11/16/21, she was mod I; sits to shower, mostly wears dresses and slip on shoes; needed assistance for dressing, bathing, hair management etc following her shoulder replacement 11/16/21 ?  ? ?  ?  ?OT Problem List: Pain;Obesity;Impaired UE functional use;Decreased knowledge of use of DME or AE;Decreased safety awareness ?  ?   ?OT Treatment/Interventions: Self-care/ADL training;Patient/family education;Therapeutic activities;Therapeutic exercise;DME and/or AE instruction  ?  ?OT Goals(Current goals can be found in the care plan section) Acute Rehab OT Goals ?Patient Stated Goal: back home ?OT Goal Formulation: With patient ?Time For Goal Achievement: 01/06/22 ?Potential to Achieve Goals:  Good ?ADL Goals ?Pt Will Perform  Upper Body Dressing: with modified independence;sitting  ?OT Frequency: Min 2X/week ?  ? ?Co-evaluation   ?  ?  ?  ?  ? ?  ?AM-PAC OT "6 Clicks" Daily Activity     ?Outcome Measure Help from ano

## 2021-12-23 NOTE — Discharge Summary (Signed)
Physician Discharge Summary  ?Marilyn [name] EQA:[id number] DOB: 1971-02-22 DOA: 12/17/2021 ? ?PCP: Bridget Hartshorn, NP ? ?Admit date: 12/17/2021 ?Discharge date: 12/23/2021 ? ?Admitted From: Home ?Disposition: Home ? ?Recommendations for Outpatient Follow-up:  ?As scheduled by orthopedic surgery ? ?Home Health: N/A ?Equipment/Devices: Portable wound VAC as arranged by surgery ? ?Discharge Condition: Stable ?CODE STATUS: Full code ?Diet recommendation: Low-salt diet ? ?Discharge summary: ? ?51 year old female with history of morbid obesity, obstructive sleep apnea on CPAP, anxiety/depression/GERD, hypothyroidism and hypertension, left reverse shoulder arthroplasty on 3/2 with subsequent I&D of superficial infection 3/17, repeat I&D and wound closure 3/22 admitted with worsening left shoulder pain and drainage.  Hemodynamically stable.  Painful.  Admitted with postop infection.  Started on vancomycin and Zosyn. ? ?Postop wound infection with recent shoulder arthroplasty: ?Debridement and wound VAC placement 4/5, tissue culture negative.  Blood cultures negative. ?Debridement and wound closure with wound VAC placement for incision 4/7. ?Received multiple antibiotics as outpatient. Surgically no deep tissue infection and no hardware infection. Treated with vancomycin and Zosyn. ?As per surgical recommendation, she is going home on 4 weeks of oral doxycycline.  Wound care, incisional wound VAC and orthopedics follow-up. ? ?Multiple chronic medical issues remained stable.  She is resuming all her home medications. ?Electrolytes were replaced and adequate before discharge. ? ? ?Discharge Diagnoses:  ?Principal Problem: ?  Postoperative wound infection ?Active Problems: ?  Dyspnea on exertion ?  Hypokalemia ?  Lactic acidosis ?  Anemia ?  Hypertension ?  Hypothyroidism ?  Asthma, chronic ?  OSA on CPAP ?  Gout ?  Acute pain of left shoulder ? ? ? ?Discharge Instructions ? ?Discharge Instructions   ? ? Call MD for:   redness, tenderness, or signs of infection (pain, swelling, redness, odor or green/yellow discharge around incision site)   Complete by: As directed ?  ? Call MD for:  severe uncontrolled pain   Complete by: As directed ?  ? Call MD for:  temperature >100.4   Complete by: As directed ?  ? Diet - low sodium heart healthy   Complete by: As directed ?  ? Increase activity slowly   Complete by: As directed ?  ? Leave dressing on - Keep it clean, dry, and intact until clinic visit   Complete by: As directed ?  ? ?  ? ?Allergies as of 12/23/2021   ? ?   Reactions  ? Lisinopril Anaphylaxis  ? Angioedema  ? Bee Venom Swelling, Other (See Comments)  ? Swelling at the site  ? Bupropion Swelling  ? Latex Itching, Rash  ? Propoxyphene Hives  ? Methadone Other (See Comments)  ? unknown  ? Codeine Nausea Only  ? Meloxicam Other (See Comments)  ? Insomnia, constipation  ? Morphine And Related Rash  ? Tegaderm Ag Mesh [silver] Hives, Itching, Rash  ? Tomato Rash  ? ?  ? ?  ?Medication List  ?  ? ?TAKE these medications   ? ?acetaminophen 500 MG tablet ?Commonly known as: TYLENOL ?Take 1,000 mg by mouth every 6 (six) hours as needed for moderate pain or headache. ?  ?albuterol 108 (90 Base) MCG/ACT inhaler ?Commonly known as: VENTOLIN HFA ?Inhale 1-2 puffs into the lungs every 6 (six) hours as needed for wheezing or shortness of breath. ?  ?allopurinol 100 MG tablet ?Commonly known as: ZYLOPRIM ?Take 1 tablet by mouth twice daily ?  ?ammonium lactate 12 % lotion ?Commonly known as: LAC-HYDRIN ?Apply 1 application. topically 2 (two)  times daily as needed for dry skin. ?  ?ascorbic Acid 500 MG Cpcr ?Commonly known as: VITAMIN C ?Take 500 mg by mouth daily. Gummy ?  ?aspirin 81 MG EC tablet ?Take 1 tablet (81 mg total) by mouth daily. Swallow whole. ?  ?buPROPion 300 MG 24 hr tablet ?Commonly known as: WELLBUTRIN XL ?Take 300 mg by mouth daily. ?  ?cetirizine 10 MG tablet ?Commonly known as: ZYRTEC ?Take 10 mg by mouth daily. ?   ?clotrimazole-betamethasone cream ?Commonly known as: LOTRISONE ?Apply 1 application topically daily as needed (irritation). ?  ?cyclobenzaprine 10 MG tablet ?Commonly known as: FLEXERIL ?Take 10-20 mg by mouth See admin instructions. 10 mg in the morning  ?20 mg at bedtime ?  ?cycloSPORINE 0.05 % ophthalmic emulsion ?Commonly known as: RESTASIS ?Place 1 drop into both eyes 2 (two) times daily as needed (dry eyes). ?  ?doxycycline 100 MG tablet ?Commonly known as: VIBRA-TABS ?Take 1 tablet (100 mg total) by mouth 2 (two) times daily. ?  ?DULoxetine 60 MG capsule ?Commonly known as: CYMBALTA ?Take 60 mg by mouth 2 (two) times daily. ?  ?Ferrous Gluconate 324 (37.5 Fe) MG Tabs ?Take 324 mg by mouth daily. ?  ?furosemide 20 MG tablet ?Commonly known as: LASIX ?Take 20 mg by mouth daily. ?  ?gabapentin 400 MG capsule ?Commonly known as: NEURONTIN ?Take 400 mg by mouth 2 (two) times daily. ?  ?hydrochlorothiazide 25 MG tablet ?Commonly known as: HYDRODIURIL ?Take 25 mg by mouth daily. ?  ?levothyroxine 150 MCG tablet ?Commonly known as: SYNTHROID ?Take 150 mcg by mouth daily before breakfast. ?  ?methocarbamol 500 MG tablet ?Commonly known as: Robaxin ?Take 1 tablet (500 mg total) by mouth every 8 (eight) hours as needed for muscle spasms. ?  ?Misc. Devices Misc ?Below the knee compression hose style calf ?Extra large size ?1 pair ?15-20 mmHg ? ? ? ?Fax to [facility] in [place], [name] ?  ?Narcan 4 MG/0.1ML Liqd nasal spray kit ?Generic drug: naloxone ?Place 0.4 mg into the nose once as needed (opiate overdose). ?  ?oxybutynin 15 MG 24 hr tablet ?Commonly known as: DITROPAN XL ?Take 15 mg by mouth daily. ?  ?oxyCODONE-acetaminophen 10-325 MG tablet ?Commonly known as: Percocet ?Take 1 tablet by mouth every 4 (four) hours as needed for pain. ?  ?Victoza 18 MG/3ML Sopn ?Generic drug: liraglutide ?Inject 1.8 mg into the skin daily. ?  ?vitamin B-12 250 MCG tablet ?Commonly known as: CYANOCOBALAMIN ?Take 250 mcg by mouth  daily. ?  ?Vitamin D (Ergocalciferol) 1.25 MG (50000 UNIT) Caps capsule ?Commonly known as: DRISDOL ?Take 50,000 Units by mouth every Monday. ?  ?zolpidem 10 MG tablet ?Commonly known as: AMBIEN ?Take 10 mg by mouth at bedtime. ?  ? ?  ? ?  ?  ? ? ?  ?Discharge Care Instructions  ?(From admission, onward)  ?  ? ? ?  ? ?  Start     Ordered  ? 12/23/21 0000  Leave dressing on - Keep it clean, dry, and intact until clinic visit       ? 12/23/21 0947  ? ?  ?  ? ?  ? ? Follow-up Information   ? ? Hemberg, Karie Schwalbe, NP. Schedule an appointment as soon as possible for a visit in 2 days.   ?Specialty: Adult Health Nurse Practitioner ?Why: Follow up from ER visit ?Contact information: ?[address] ?[name] [address] ?[phone] ? ? ?  ?  ? ? [facility]  [place]. Go to .   ?Specialty: Emergency Medicine ?Why: As needed, If symptoms worsen ?Contact information: ?[address] ?[id number] mc ?[place] ?[phone] ? ?  ?  ? ? Newt Minion, MD Follow up in 1 week(s).   ?Specialty: Orthopedic Surgery ?Contact information: ?[address] ?[name] [address] ?[phone] ? ? ?  ?  ? ?  ?  ? ?  ? ?Allergies  ?Allergen Reactions  ? Lisinopril Anaphylaxis  ?  Angioedema  ? Bee Venom Swelling and Other (See Comments)  ?  Swelling at the site  ? Bupropion Swelling  ? Latex Itching and Rash  ? Propoxyphene Hives  ? Methadone Other (See Comments)  ?  unknown  ? Codeine Nausea Only  ? Meloxicam Other (See Comments)  ?  Insomnia, constipation  ? Morphine And Related Rash  ? Tegaderm Ag Mesh [Silver] Hives, Itching and Rash  ? Tomato Rash  ? ? ?Consultations: ?Orthopedics ? ? ?Procedures/Studies: ?CT Angio Chest Pulmonary Embolism (PE) W or WO Contrast ? ?Result Date: 12/19/2021 ?CLINICAL DATA:  Pulmonary embolism suspected. High probability. Reverse left shoulder arthroplasty 11/16/2021, subsequent left shoulder irrigation and excisional  debridement 12/01/2021 subsequent left shoulder debridement and wound closure 12/06/2021. EXAM: CT ANGIOGRAPHY CHEST WITH CONTRAST TECHNIQUE: Multidetector CT imaging of the chest was performed using the standard protocol du

## 2021-12-23 NOTE — Progress Notes (Signed)
RT checked on patient and offered to put her on CPAP. She did not want to be placed on CPAP at this time. Family member in room said she would call when patient was ready. ?

## 2021-12-25 LAB — AEROBIC/ANAEROBIC CULTURE W GRAM STAIN (SURGICAL/DEEP WOUND)
Culture: NO GROWTH
Gram Stain: NONE SEEN

## 2021-12-26 ENCOUNTER — Other Ambulatory Visit (HOSPITAL_COMMUNITY): Payer: Self-pay

## 2021-12-29 ENCOUNTER — Ambulatory Visit (INDEPENDENT_AMBULATORY_CARE_PROVIDER_SITE_OTHER): Payer: Medicare Other | Admitting: Family

## 2021-12-29 ENCOUNTER — Encounter: Payer: Self-pay | Admitting: Family

## 2021-12-29 DIAGNOSIS — Z96612 Presence of left artificial shoulder joint: Secondary | ICD-10-CM

## 2021-12-29 DIAGNOSIS — S41002D Unspecified open wound of left shoulder, subsequent encounter: Secondary | ICD-10-CM

## 2021-12-29 MED ORDER — OXYCODONE-ACETAMINOPHEN 10-325 MG PO TABS: 1.0000 | ORAL_TABLET | Freq: Four times a day (QID) | ORAL | 0 refills | Status: DC | PRN

## 2021-12-29 NOTE — Progress Notes (Signed)
? ?Post-Op Visit Note ?  ?Patient: Marilyn [name]           ?Date of Birth: Jan 20, 1971           ?MRN: [id number] ?Visit Date: 12/29/2021 ?PCP: Bridget Hartshorn, NP ? ?Chief Complaint:  ?Chief Complaint  ?Patient presents with  ? Left Shoulder - Routine Post Op  ?  12/22/21 left shoulder debridement  ? ? ?HPI:  ?HPI ?The patient is a 51 year old woman who presents status post left shoulder debridement on April 5 as well as April 7.  Sutures are in place.  Wound VAC was removed today. ?Ortho Exam ?On examination of the left anterior shoulder sutures are in place the incision is well approximated.  There is no active drainage today.  She does have some blistering of her surrounding skin none of these are open or draining.  There is no erythema or warmth. ? ?Visit Diagnoses: No diagnosis found. ? ?Plan: Mepilex border dressing applied.  She will change this in 2 days. keep the incision clean dry and dressed ? ?Follow-Up Instructions: No follow-ups on file.  ? ?Imaging: ?No results found. ? ?Orders:  ?No orders of the defined types were placed in this encounter. ? ?Meds ordered this encounter  ?Medications  ? oxyCODONE-acetaminophen (PERCOCET) 10-325 MG tablet  ?  Sig: Take 1 tablet by mouth every 6 (six) hours as needed for pain.  ?  Dispense:  30 tablet  ?  Refill:  0  ? ? ? ?PMFS History: ?Patient Active Problem List  ? Diagnosis Date Noted  ? Lactic acidosis 12/18/2021  ? Dyspnea on exertion 12/18/2021  ? Anemia 12/18/2021  ? Gout 12/18/2021  ? Acute pain of left shoulder   ? Arthritis of left shoulder region   ? 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 Unilateral primary osteoarthritis, right hip 05/09/2021  ? S/P lumbar spinal fusion 05/09/2021  ? S/P insertion of spinal cord stimulator 05/09/2021  ? H/O total knee replacement, bilateral 01/18/2021  ? Lumbar post-laminectomy syndrome 04/06/2020  ? Long-term current use of opiate analgesic 11/04/2019  ? Leg pain, right 10/20/2019  ?  Dysesthesia 10/20/2019  ? 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 Other spondylosis with radiculopathy, lumbar region   ? Status post lumbar laminectomy 06/23/2019  ? C. difficile colitis 05/22/2019  ? Hypophosphatemia 05/21/2019  ? Syncope 05/20/2019  ? OSA on CPAP   ? Acute gastroenteritis   ? Anemia due to blood loss, acute 06/18/2018  ?  Class: Acute  ? Plantar fasciitis of left foot 06/16/2018  ?  Class: Chronic  ? Tendonitis, Achilles, right 06/16/2018  ?  Class: Chronic  ? Osteochondral talar dome lesion 06/16/2018  ?  Class: Chronic  ? Deep incisional surgical site infection   ? Superficial incisional surgical site infection 06/11/2018  ?  Class: Acute  ? Right ankle effusion 06/11/2018  ?  Class: Acute  ? Morbid (severe) obesity due to excess calories ([place]) 05/29/2018  ?  Class: Chronic  ? Spondylolisthesis, lumbar region 05/27/2018  ?  Class: Chronic  ? Spinal stenosis of lumbar region 05/27/2018  ?  Class: Chronic  ? Urinary tract infection 05/27/2018  ?  Class: Acute  ? Fusion of spine of lumbar region 05/27/2018  ? Pain of right hip joint 07/03/2017  ? Acute right-sided low back pain with right-sided sciatica 03/27/2017  ? Chronic right shoulder pain 02/13/2017  ? History of rotator cuff tear 11/30/2016  ? Impingement syndrome  of right shoulder 11/30/2016  ? Exacerbation of asthma 07/18/2016  ? Hypokalemia 07/18/2016  ? Hypertension 07/18/2016  ? Depression with anxiety 07/18/2016  ? Hypothyroidism 07/18/2016  ? Hyperglycemia 07/18/2016  ? Asthma, chronic 07/18/2016  ? Surgical wound dehiscence left hip; questionable superficial infection 11/10/2015  ? Postoperative wound infection 11/10/2015  ? Osteoarthritis of left hip 09/09/2015  ? Status post total replacement of left hip 09/09/2015  ? Obesity (BMI 35.0-39.9 without comorbidity) 04/28/2013  ? ?Past Medical History:  ?Diagnosis Date  ? Anemia   ? taking iron now. pt states having no current issues 09/02/2015  ? Anginal pain ([place])    ? pt states experiences chest wall pain pt states related to her asthma   ? Anxiety   ? with MRI's  ? Arthritis   ? Everywhere  ? Asthma   ? Depression   ? Dizziness   ? GERD (gastroesophageal reflux disease)   ? Headache(784.0)   ? HX OF MIGRAINES  ? History of bronchitis   ? Hypertension   ? Hypothyroidism   ? takes levothyroxen  ? OSA on CPAP   ? wears cpap  ? Shortness of breath   ? with exertion  ? Wears glasses   ?  ?Family History  ?Problem Relation Age of Onset  ? Cancer Mother   ?     colon  ? Epilepsy Mother   ? Cancer Father   ?     prostate  ? Diabetes Father   ? Hypertension Father   ? Hypertension Maternal Aunt   ? Diabetes Maternal Aunt   ? Hypertension Paternal Aunt   ?  ?Past Surgical History:  ?Procedure Laterality Date  ? APPLICATION OF WOUND VAC  11/16/2021  ? Procedure: APPLICATION OF WOUND VAC;  Surgeon: Meredith Pel, MD;  Location: [place];  Service: Orthopedics;;  ? APPLICATION OF WOUND VAC Left 12/01/2021  ? Procedure: APPLICATION OF WOUND VAC;  Surgeon: Meredith Pel, MD;  Location: [place];  Service: Orthopedics;  Laterality: Left;  ? BACK SURGERY    ? CESAREAN SECTION    ? times 2  ? CHOLECYSTECTOMY    ? COLONOSCOPY  2020  ? ENDOMETRIAL ABLATION    ? I & D EXTREMITY Left 12/06/2021  ? Procedure: LEFT SHOULDER DEBRIDEMENT AND WOUND CLOSURE;  Surgeon: Newt Minion, MD;  Location: [place];  Service: Orthopedics;  Laterality: Left;  ? I & D EXTREMITY Left 12/20/2021  ? Procedure: LEFT SHOULDER DEBRIDEMENT;  Surgeon: Newt Minion, MD;  Location: [place];  Service: Orthopedics;  Laterality: Left;  ? I & D EXTREMITY Left 12/22/2021  ? Procedure: LEFT SHOULDER DEBRIDEMENT;  Surgeon: Newt Minion, MD;  Location: [place];  Service: Orthopedics;  Laterality: Left;  ? INCISION AND DRAINAGE Left 12/01/2021  ? Procedure: LEFT SHOULDER IRRIGATION AND EXCISIONAL DEBRIDEMENT;  Surgeon: Meredith Pel, MD;  Location: [facility];  Service: Orthopedics;  Laterality: Left;  ? INCISION AND DRAINAGE HIP Left  11/10/2015  ? Procedure: IRRIGATION AND DEBRIDEMENT LEFT HIP INCISION;  Surgeon: Mcarthur Rossetti, MD;  Location: [place];  Service: Orthopedics;  Laterality: Left;  ? JOINT REPLACEMENT  2011  ? total left knee  ? KNEE ARTHROPLASTY  04/23/2012  ? right   ? KNEE ARTHROSCOPY    ? LUMBAR LAMINECTOMY/DECOMPRESSION MICRODISCECTOMY N/A 06/23/2019  ? Procedure: RIGHT LUMBAR FIVE THROUGH SACRAL ONE PARTIAL HEMILAMINECTOMY WITH RIGHT LUMBAR FIVE FORAMINOTOMY;  Surgeon: Jessy Oto, MD;  Location: [place];  Service: Orthopedics;  Laterality: N/A;  ? LUMBAR WOUND DEBRIDEMENT N/A 06/11/2018  ? Procedure: LUMBAR WOUND DEBRIDEMENT;  Surgeon: Jessy Oto, MD;  Location: [facility];  Service: Orthopedics;  Laterality: N/A;  ? RADIOLOGY WITH ANESTHESIA N/A 09/09/2018  ? Procedure: LUMBER SPINE WITHOUT CONTRAST;  Surgeon: Radiologist, Medication, MD;  Location: [place];  Service: Radiology;  Laterality: N/A;  ? REVERSE SHOULDER ARTHROPLASTY Left 11/16/2021  ? Procedure: LEFT REVERSE SHOULDER ARTHROPLASTY;  Surgeon: Meredith Pel, MD;  Location: [facility];  Service: Orthopedics;  Laterality: Left;  ? ROTATOR CUFF REPAIR    ? left   ? SHOULDER SURGERY    ? right to repair ligament tear  ? TOTAL HIP ARTHROPLASTY Left 09/09/2015  ? Procedure: LEFT TOTAL HIP ARTHROPLASTY ANTERIOR APPROACH;  Surgeon: Mcarthur Rossetti, MD;  Location: [facility];  Service: Orthopedics;  Laterality: Left;  ? TOTAL KNEE ARTHROPLASTY  04/23/2012  ? Procedure: TOTAL KNEE ARTHROPLASTY;  Surgeon: Newt Minion, MD;  Location: [place] [facility];  Service: Orthopedics;  Laterality: Right;  Right Total Knee Arthroplasty  ? TUBAL LIGATION  1996  ? ?Social History  ? ?Occupational History  ? Not on file  ?Tobacco Use  ? Smoking status: Former  ?  Packs/day: 0.50  ?  Years: 4.00  ?  Pack years: 2.00  ?  Types: Cigarettes  ?  Quit date: 09/18/1987  ?  Years since quitting: 34.3  ? Smokeless tobacco: Never  ?Vaping Use  ? Vaping Use: Never used  ?Substance and Sexual Activity  ?  Alcohol use: No  ? Drug use: No  ? Sexual activity: Yes  ?  Birth control/protection: Surgical  ? ? ?

## 2022-01-01 ENCOUNTER — Ambulatory Visit (INDEPENDENT_AMBULATORY_CARE_PROVIDER_SITE_OTHER): Payer: Medicare Other | Admitting: Orthopedic Surgery

## 2022-01-01 DIAGNOSIS — S41002D Unspecified open wound of left shoulder, subsequent encounter: Secondary | ICD-10-CM

## 2022-01-02 ENCOUNTER — Other Ambulatory Visit: Payer: Self-pay | Admitting: Orthopedic Surgery

## 2022-01-02 ENCOUNTER — Telehealth: Payer: Self-pay | Admitting: Orthopedic Surgery

## 2022-01-02 MED ORDER — OXYCODONE-ACETAMINOPHEN 10-325 MG PO TABS: 1.0000 | ORAL_TABLET | Freq: Four times a day (QID) | ORAL | 0 refills | Status: DC | PRN

## 2022-01-02 NOTE — Telephone Encounter (Signed)
Pt is s/p debridement of her shoulder and graft placement requesting a refill on her Oxycodone. Please advise.  ?

## 2022-01-02 NOTE — Telephone Encounter (Signed)
Called pt to advise rx has been sent to pharm °

## 2022-01-02 NOTE — Telephone Encounter (Signed)
Ms. Boerema is calling in requesting a medication refill for Oxycodone. ?

## 2022-01-03 ENCOUNTER — Telehealth: Payer: Self-pay

## 2022-01-03 ENCOUNTER — Other Ambulatory Visit: Payer: Self-pay | Admitting: Orthopedic Surgery

## 2022-01-03 MED ORDER — METHOCARBAMOL 500 MG PO TABS: 500.0000 mg | ORAL_TABLET | Freq: Three times a day (TID) | ORAL | 0 refills | Status: DC | PRN

## 2022-01-03 MED ORDER — DOXYCYCLINE HYCLATE 100 MG PO TABS: 100.0000 mg | ORAL_TABLET | Freq: Two times a day (BID) | ORAL | 0 refills | Status: AC

## 2022-01-03 MED ORDER — OXYCODONE-ACETAMINOPHEN 10-325 MG PO TABS: 1.0000 | ORAL_TABLET | Freq: Four times a day (QID) | ORAL | 0 refills | Status: DC | PRN

## 2022-01-03 NOTE — Telephone Encounter (Signed)
Patient would like a refill for doxy and methocarbamol she stated she was unsure if she needed to keep taking it.   ?

## 2022-01-03 NOTE — Telephone Encounter (Signed)
Pt informed

## 2022-01-03 NOTE — Telephone Encounter (Signed)
Robaxin was already filled today ?Does not need more doxy, got 60 on 4/7

## 2022-01-03 NOTE — Telephone Encounter (Signed)
Sent to me by mistake 

## 2022-01-14 ENCOUNTER — Encounter: Payer: Self-pay | Admitting: Orthopedic Surgery

## 2022-01-14 NOTE — Progress Notes (Signed)
? ?Office Visit Note ?  ?Patient: Marilyn [name]           ?Date of Birth: [birth year]           ?MRN: [id number] ?Visit Date: 01/01/2022 ?             ?Requested by: Rebecka Apley, NP ?[address] ?[address] ?[place],  [place] [address] ?PCP: Rebecka Apley, NP ? ?Chief Complaint  ?Patient presents with  ? Left Shoulder - Routine Post Op  ?  12/22/2021 left shoulder debridement kerecis micro   ? ? ? ? ?HPI: ?Patient is a 51 year old woman who presents 10 days status post debridement and application of Kerecis micro powder for dehiscence left shoulder incision.  Patient states she has a small spot of drainage distally and a small open area. ? ?Assessment & Plan: ?Visit Diagnoses:  ?1. Wound of left shoulder, subsequent encounter   ? ? ?Plan: Recommended soap and water cleansing daily dry dressing use absorbent dressing or a washcloth and axilla to keep the incision clean and dry she is still on doxycycline. ? ?Follow-Up Instructions: Return in about 1 week (around 01/08/2022).  ? ?Ortho Exam ? ?Patient is alert, oriented, no adenopathy, well-dressed, normal affect, normal respiratory effort. ?Examination the incision is clean and dry there is no open wound no cellulitis. ? ?Imaging: ?No results found. ?No images are attached to the encounter. ? ?Labs: ?Lab Results  ?Component Value Date  ? HGBA1C 6.2 (H) 07/18/2016  ? HGBA1C 6.2 (H) 09/02/2015  ? ESRSEDRATE 106 (H) 12/18/2021  ? ESRSEDRATE 70 (H) 12/04/2021  ? ESRSEDRATE 56 (H) 01/08/2019  ? CRP 5.2 (H) 12/17/2021  ? CRP 1.8 (H) 12/08/2021  ? CRP 2.5 (H) 12/04/2021  ? LABURIC 8.6 (H) 12/18/2021  ? LABURIC 5.9 01/08/2019  ? LABURIC 8.7 (H) 06/16/2018  ? REPTSTATUS 12/25/2021 FINAL 12/20/2021  ? GRAMSTAIN NO WBC SEEN ?NO ORGANISMS SEEN ? 12/20/2021  ? CULT  12/20/2021  ?  No growth aerobically or anaerobically. ?Performed at [facility], [address] [place], [place] [address] ?  ? LABORGA PSEUDOMONAS AERUGINOSA 06/11/2018  ? ? ? ?Lab Results   ?Component Value Date  ? ALBUMIN 3.9 06/17/2019  ? ALBUMIN 4.1 05/20/2019  ? ALBUMIN 3.3 (L) 07/10/2018  ? ? ?Lab Results  ?Component Value Date  ? MG 2.2 12/23/2021  ? MG 1.6 (L) 12/19/2021  ? MG 1.5 (L) 12/18/2021  ? ?No results found for: VD25OH ? ?No results found for: PREALBUMIN ? ?  Latest Ref Rng & Units 12/23/2021  ? 12:55 AM 12/19/2021  ?  3:08 AM 12/17/2021  ?  9:49 PM  ?CBC EXTENDED  ?WBC 4.0 - 10.5 K/uL 7.1   7.0   6.3    ?RBC 3.87 - 5.11 MIL/uL 2.92   3.04   3.40    ?Hemoglobin 12.0 - 15.0 g/dL 8.2   8.7   9.7    ?HCT 36.0 - 46.0 % 27.1   28.0   32.4    ?Platelets 150 - 400 K/uL 249   247   300    ?NEUT# 1.7 - 7.7 K/uL 5.6   4.3   3.7    ?Lymph# 0.7 - 4.0 K/uL 1.4   1.8   2.0    ? ? ? ?There is no height or weight on file to calculate BMI. ? ?Orders:  ?No orders of the defined types were placed in this encounter. ? ?No orders of the defined  types were placed in this encounter. ? ? ? Procedures: ?No procedures performed ? ?Clinical Data: ?No additional findings. ? ?ROS: ? ?All other systems negative, except as noted in the HPI. ?Review of Systems ? ?Objective: ?Vital Signs: LMP  (LMP Unknown) Comment: tubal ligation ? ?Specialty Comments:  ?No specialty comments available. ? ?PMFS History: ?Patient Active Problem List  ? Diagnosis Date Noted  ? Lactic acidosis 12/18/2021  ? Dyspnea on exertion 12/18/2021  ? Anemia 12/18/2021  ? Gout 12/18/2021  ? Acute pain of left shoulder   ? Arthritis of left shoulder region   ? 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 Unilateral primary osteoarthritis, right hip 05/09/2021  ? S/P lumbar spinal fusion 05/09/2021  ? S/P insertion of spinal cord stimulator 05/09/2021  ? H/O total knee replacement, bilateral 01/18/2021  ? Lumbar post-laminectomy syndrome 04/06/2020  ? Long-term current use of opiate analgesic 11/04/2019  ? Leg pain, right 10/20/2019  ? Dysesthesia 10/20/2019  ? 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 Other spondylosis with  radiculopathy, lumbar region   ? Status post lumbar laminectomy 06/23/2019  ? C. difficile colitis 05/22/2019  ? Hypophosphatemia 05/21/2019  ? Syncope 05/20/2019  ? OSA on CPAP   ? Acute gastroenteritis   ? Anemia due to blood loss, acute 06/18/2018  ?  Class: Acute  ? Plantar fasciitis of left foot 06/16/2018  ?  Class: Chronic  ? Tendonitis, Achilles, right 06/16/2018  ?  Class: Chronic  ? Osteochondral talar dome lesion 06/16/2018  ?  Class: Chronic  ? Deep incisional surgical site infection   ? Superficial incisional surgical site infection 06/11/2018  ?  Class: Acute  ? Right ankle effusion 06/11/2018  ?  Class: Acute  ? Morbid (severe) obesity due to excess calories ([facility]) 05/29/2018  ?  Class: Chronic  ? Spondylolisthesis, lumbar region 05/27/2018  ?  Class: Chronic  ? Spinal stenosis of lumbar region 05/27/2018  ?  Class: Chronic  ? Urinary tract infection 05/27/2018  ?  Class: Acute  ? Fusion of spine of lumbar region 05/27/2018  ? Pain of right hip joint 07/03/2017  ? Acute right-sided low back pain with right-sided sciatica 03/27/2017  ? Chronic right shoulder pain 02/13/2017  ? History of rotator cuff tear 11/30/2016  ? Impingement syndrome of right shoulder 11/30/2016  ? Exacerbation of asthma 07/18/2016  ? Hypokalemia 07/18/2016  ? Hypertension 07/18/2016  ? Depression with anxiety 07/18/2016  ? Hypothyroidism 07/18/2016  ? Hyperglycemia 07/18/2016  ? Asthma, chronic 07/18/2016  ? Surgical wound dehiscence left hip; questionable superficial infection 11/10/2015  ? Postoperative wound infection 11/10/2015  ? Osteoarthritis of left hip 09/09/2015  ? Status post total replacement of left hip 09/09/2015  ? Obesity (BMI 35.0-39.9 without comorbidity) 04/28/2013  ? ?Past Medical History:  ?Diagnosis Date  ? Anemia   ? taking iron now. pt states having no current issues 09/02/2015  ? Anginal pain ([facility])   ? pt states experiences chest wall pain pt states related to her asthma   ? Anxiety   ? with MRI's  ?  Arthritis   ? Everywhere  ? Asthma   ? Depression   ? Dizziness   ? GERD (gastroesophageal reflux disease)   ? Headache(784.0)   ? HX OF MIGRAINES  ? History of bronchitis   ? Hypertension   ? Hypothyroidism   ? takes levothyroxen  ? OSA on CPAP   ? wears cpap  ? Shortness of breath   ?  with exertion  ? Wears glasses   ?  ?Family History  ?Problem Relation Age of Onset  ? Cancer Mother   ?     colon  ? Epilepsy Mother   ? Cancer Father   ?     prostate  ? Diabetes Father   ? Hypertension Father   ? Hypertension Maternal Aunt   ? Diabetes Maternal Aunt   ? Hypertension Paternal Aunt   ?  ?Past Surgical History:  ?Procedure Laterality Date  ? APPLICATION OF WOUND VAC  11/16/2021  ? Procedure: APPLICATION OF WOUND VAC;  Surgeon: Cammy Copa, MD;  Location: [facility];  Service: Orthopedics;;  ? APPLICATION OF WOUND VAC Left 12/01/2021  ? Procedure: APPLICATION OF WOUND VAC;  Surgeon: Cammy Copa, MD;  Location: [facility];  Service: Orthopedics;  Laterality: Left;  ? BACK SURGERY    ? CESAREAN SECTION    ? times 2  ? CHOLECYSTECTOMY    ? COLONOSCOPY  2020  ? ENDOMETRIAL ABLATION    ? I & D EXTREMITY Left 12/06/2021  ? Procedure: LEFT SHOULDER DEBRIDEMENT AND WOUND CLOSURE;  Surgeon: [name], MD;  Location: [facility];  Service: Orthopedics;  Laterality: Left;  ? I & D EXTREMITY Left 12/20/2021  ? Procedure: LEFT SHOULDER DEBRIDEMENT;  Surgeon: [name], MD;  Location[facility] OR;  Service: Orthopedics;  Laterality: Left;  ? I & D EXTREMITY Left 12/22/2021  ? Procedure: LEFT SHOULDER DEBRIDEMENT;  Surgeon: [name], MD;  Location: [facility] OR;  Service: Orthopedics;  Laterality: Left;  ? INCISION AND DRAINAGE Left 12/01/2021  ? Procedure: LEFT SHOULDER IRRIGATION AND EXCISIONAL DEBRIDEMENT;  Surgeon: Cammy Copa, MD;  Location: [facility] OR;  Service: Orthopedics;  Laterality: Left;  ? INCISION AND DRAINAGE HIP Left 11/10/2015  ? Procedure: IRRIGATION AND DEBRIDEMENT LEFT HIP INCISION;  Surgeon: Kathryne Hitch,  MD;  Location: [facility] OR;  Service: Orthopedics;  Laterality: Left;  ? JOINT REPLACEMENT  2011  ? total left knee  ? KNEE ARTHROPLASTY  04/23/2012  ? right   ? KNEE ARTHROSCOPY    ? LUMBAR LAMINECTOMY/DECOM

## 2022-01-15 ENCOUNTER — Ambulatory Visit (INDEPENDENT_AMBULATORY_CARE_PROVIDER_SITE_OTHER): Payer: Medicare Other | Admitting: Orthopedic Surgery

## 2022-01-15 ENCOUNTER — Encounter: Payer: Self-pay | Admitting: Orthopedic Surgery

## 2022-01-15 DIAGNOSIS — S41002D Unspecified open wound of left shoulder, subsequent encounter: Secondary | ICD-10-CM

## 2022-01-15 NOTE — Progress Notes (Signed)
? ?Office Visit Note ?  ?Patient: Marilyn [name]           ?Date of Birth: 1971/04/01           ?MRN: [id number] ?Visit Date: 01/15/2022 ?             ?Requested by: Rebecka Apley, NP ?[address] ?[address] ?[place],  [place] [address] ?PCP: Rebecka Apley, NP ? ?Chief Complaint  ?Patient presents with  ? Left Shoulder - Routine Post Op  ?  12/22/21 left shoulder debridement w/ kerecis graft  ? ? ? ? ?HPI: ?Patient is a 51 year old woman who is status post tissue graft for nonhealing left shoulder wound.  Patient has no pain or symptoms. ? ?Assessment & Plan: ?Visit Diagnoses:  ?1. Wound of left shoulder, subsequent encounter   ? ? ?Plan: Patient will start scar massage sutures are harvested today. ? ?Follow-Up Instructions: Return if symptoms worsen or fail to improve.  ? ?Ortho Exam ? ?Patient is alert, oriented, no adenopathy, well-dressed, normal affect, normal respiratory effort. ?Examination patient's surgical incision is well-healed with excellent epithelization no drainage no redness no cellulitis no pain no signs of infection.  Sutures are harvested today. ? ?Imaging: ?No results found. ?No images are attached to the encounter. ? ?Labs: ?Lab Results  ?Component Value Date  ? HGBA1C 6.2 (H) 07/18/2016  ? HGBA1C 6.2 (H) 09/02/2015  ? ESRSEDRATE 106 (H) 12/18/2021  ? ESRSEDRATE 70 (H) 12/04/2021  ? ESRSEDRATE 56 (H) 01/08/2019  ? CRP 5.2 (H) 12/17/2021  ? CRP 1.8 (H) 12/08/2021  ? CRP 2.5 (H) 12/04/2021  ? LABURIC 8.6 (H) 12/18/2021  ? LABURIC 5.9 01/08/2019  ? LABURIC 8.7 (H) 06/16/2018  ? REPTSTATUS 12/25/2021 FINAL 12/20/2021  ? GRAMSTAIN NO WBC SEEN ?NO ORGANISMS SEEN ? 12/20/2021  ? CULT  12/20/2021  ?  No growth aerobically or anaerobically. ?Performed at [facility], [address] [place], [place] [address] ?  ? LABORGA PSEUDOMONAS AERUGINOSA 06/11/2018  ? ? ? ?Lab Results  ?Component Value Date  ? ALBUMIN 3.9 06/17/2019  ? ALBUMIN 4.1 05/20/2019  ? ALBUMIN 3.3 (L) 07/10/2018   ? ? ?Lab Results  ?Component Value Date  ? MG 2.2 12/23/2021  ? MG 1.6 (L) 12/19/2021  ? MG 1.5 (L) 12/18/2021  ? ?No results found for: VD25OH ? ?No results found for: PREALBUMIN ? ?  Latest Ref Rng & Units 12/23/2021  ? 12:55 AM 12/19/2021  ?  3:08 AM 12/17/2021  ?  9:49 PM  ?CBC EXTENDED  ?WBC 4.0 - 10.5 K/uL 7.1   7.0   6.3    ?RBC 3.87 - 5.11 MIL/uL 2.92   3.04   3.40    ?Hemoglobin 12.0 - 15.0 g/dL 8.2   8.7   9.7    ?HCT 36.0 - 46.0 % 27.1   28.0   32.4    ?Platelets 150 - 400 K/uL 249   247   300    ?NEUT# 1.7 - 7.7 K/uL 5.6   4.3   3.7    ?Lymph# 0.7 - 4.0 K/uL 1.4   1.8   2.0    ? ? ? ?There is no height or weight on file to calculate BMI. ? ?Orders:  ?No orders of the defined types were placed in this encounter. ? ?No orders of the defined types were placed in this encounter. ? ? ? Procedures: ?No procedures performed ? ?Clinical Data: ?No additional findings. ? ?ROS: ? ?  All other systems negative, except as noted in the HPI. ?Review of Systems ? ?Objective: ?Vital Signs: LMP  (LMP Unknown) Comment: tubal ligation ? ?Specialty Comments:  ?No specialty comments available. ? ?PMFS History: ?Patient Active Problem List  ? Diagnosis Date Noted  ? Lactic acidosis 12/18/2021  ? Dyspnea on exertion 12/18/2021  ? Anemia 12/18/2021  ? Gout 12/18/2021  ? Acute pain of left shoulder   ? Arthritis of left shoulder region   ? 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 Unilateral primary osteoarthritis, right hip 05/09/2021  ? S/P lumbar spinal fusion 05/09/2021  ? S/P insertion of spinal cord stimulator 05/09/2021  ? H/O total knee replacement, bilateral 01/18/2021  ? Lumbar post-laminectomy syndrome 04/06/2020  ? Long-term current use of opiate analgesic 11/04/2019  ? Leg pain, right 10/20/2019  ? Dysesthesia 10/20/2019  ? 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 Other spondylosis with radiculopathy, lumbar region   ? Status post lumbar laminectomy 06/23/2019  ? C. difficile colitis  05/22/2019  ? Hypophosphatemia 05/21/2019  ? Syncope 05/20/2019  ? OSA on CPAP   ? Acute gastroenteritis   ? Anemia due to blood loss, acute 06/18/2018  ?  Class: Acute  ? Plantar fasciitis of left foot 06/16/2018  ?  Class: Chronic  ? Tendonitis, Achilles, right 06/16/2018  ?  Class: Chronic  ? Osteochondral talar dome lesion 06/16/2018  ?  Class: Chronic  ? Deep incisional surgical site infection   ? Superficial incisional surgical site infection 06/11/2018  ?  Class: Acute  ? Right ankle effusion 06/11/2018  ?  Class: Acute  ? Morbid (severe) obesity due to excess calories (HCC) 05/29/2018  ?  Class: Chronic  ? Spondylolisthesis, lumbar region 05/27/2018  ?  Class: Chronic  ? Spinal stenosis of lumbar region 05/27/2018  ?  Class: Chronic  ? Urinary tract infection 05/27/2018  ?  Class: Acute  ? Fusion of spine of lumbar region 05/27/2018  ? Pain of right hip joint 07/03/2017  ? Acute right-sided low back pain with right-sided sciatica 03/27/2017  ? Chronic right shoulder pain 02/13/2017  ? History of rotator cuff tear 11/30/2016  ? Impingement syndrome of right shoulder 11/30/2016  ? Exacerbation of asthma 07/18/2016  ? Hypokalemia 07/18/2016  ? Hypertension 07/18/2016  ? Depression with anxiety 07/18/2016  ? Hypothyroidism 07/18/2016  ? Hyperglycemia 07/18/2016  ? Asthma, chronic 07/18/2016  ? Surgical wound dehiscence left hip; questionable superficial infection 11/10/2015  ? Postoperative wound infection 11/10/2015  ? Osteoarthritis of left hip 09/09/2015  ? Status post total replacement of left hip 09/09/2015  ? Obesity (BMI 35.0-39.9 without comorbidity) 04/28/2013  ? ?Past Medical History:  ?Diagnosis Date  ? Anemia   ? taking iron now. pt states having no current issues 09/02/2015  ? Anginal pain (HCC)   ? pt states experiences chest wall pain pt states related to her asthma   ? Anxiety   ? with MRI's  ? Arthritis   ? Everywhere  ? Asthma   ? Depression   ? Dizziness   ? GERD (gastroesophageal reflux  disease)   ? Headache(784.0)   ? HX OF MIGRAINES  ? History of bronchitis   ? Hypertension   ? Hypothyroidism   ? takes levothyroxen  ? OSA on CPAP   ? wears cpap  ? Shortness of breath   ? with exertion  ? Wears glasses   ?  ?Family History  ?Problem Relation Age of Onset  ? Cancer  Mother   ?     colon  ? Epilepsy Mother   ? Cancer Father   ?     prostate  ? Diabetes Father   ? Hypertension Father   ? Hypertension Maternal Aunt   ? Diabetes Maternal Aunt   ? Hypertension Paternal Aunt   ?  ?Past Surgical History:  ?Procedure Laterality Date  ? APPLICATION OF WOUND VAC  11/16/2021  ? Procedure: APPLICATION OF WOUND VAC;  Surgeon: Cammy Copa, MD;  Location: [facility] OR;  Service: Orthopedics;;  ? APPLICATION OF WOUND VAC Left 12/01/2021  ? Procedure: APPLICATION OF WOUND VAC;  Surgeon: Cammy Copa, MD;  Location: [facility] OR;  Service: Orthopedics;  Laterality: Left;  ? BACK SURGERY    ? CESAREAN SECTION    ? times 2  ? CHOLECYSTECTOMY    ? COLONOSCOPY  2020  ? ENDOMETRIAL ABLATION    ? I & D EXTREMITY Left 12/06/2021  ? Procedure: LEFT SHOULDER DEBRIDEMENT AND WOUND CLOSURE;  Surgeon: [name], MD;  Location: [facility] OR;  Service: Orthopedics;  Laterality: Left;  ? I & D EXTREMITY Left 12/20/2021  ? Procedure: LEFT SHOULDER DEBRIDEMENT;  Surgeon: [name], MD;  Location: [facility] OR;  Service: Orthopedics;  Laterality: Left;  ? I & D EXTREMITY Left 12/22/2021  ? Procedure: LEFT SHOULDER DEBRIDEMENT;  Surgeon: [name], MD;  Location: [facility] OR;  Service: Orthopedics;  Laterality: Left;  ? INCISION AND DRAINAGE Left 12/01/2021  ? Procedure: LEFT SHOULDER IRRIGATION AND EXCISIONAL DEBRIDEMENT;  Surgeon: Cammy Copa, MD;  Location: [facility] OR;  Service: Orthopedics;  Laterality: Left;  ? INCISION AND DRAINAGE HIP Left 11/10/2015  ? Procedure: IRRIGATION AND DEBRIDEMENT LEFT HIP INCISION;  Surgeon: Kathryne Hitch, MD;  Location: [facility] OR;  Service: Orthopedics;  Laterality: Left;  ? JOINT REPLACEMENT  2011  ?  total left knee  ? KNEE ARTHROPLASTY  04/23/2012  ? right   ? KNEE ARTHROSCOPY    ? LUMBAR LAMINECTOMY/DECOMPRESSION MICRODISCECTOMY N/A 06/23/2019  ? Procedure: RIGHT LUMBAR FIVE THROUGH SACRAL ONE PARTIAL HEMILA

## 2022-01-18 ENCOUNTER — Other Ambulatory Visit: Payer: Self-pay | Admitting: Surgical

## 2022-01-18 ENCOUNTER — Other Ambulatory Visit: Payer: Self-pay | Admitting: Orthopedic Surgery

## 2022-01-18 MED ORDER — METHOCARBAMOL 500 MG PO TABS: 500.0000 mg | ORAL_TABLET | Freq: Three times a day (TID) | ORAL | 0 refills | Status: DC | PRN

## 2022-01-18 MED ORDER — OXYCODONE-ACETAMINOPHEN 5-325 MG PO TABS
1.0000 | ORAL_TABLET | Freq: Three times a day (TID) | ORAL | 0 refills | Status: DC | PRN
Start: 2022-01-18 — End: 2022-02-09

## 2022-01-18 NOTE — Telephone Encounter (Signed)
Sent to me by mistake 

## 2022-01-19 ENCOUNTER — Other Ambulatory Visit: Payer: Self-pay | Admitting: Family

## 2022-02-09 ENCOUNTER — Telehealth: Payer: Self-pay | Admitting: Orthopedic Surgery

## 2022-02-09 ENCOUNTER — Other Ambulatory Visit: Payer: Self-pay | Admitting: Surgical

## 2022-02-09 ENCOUNTER — Ambulatory Visit (INDEPENDENT_AMBULATORY_CARE_PROVIDER_SITE_OTHER): Payer: Medicare Other | Admitting: Orthopedic Surgery

## 2022-02-09 DIAGNOSIS — Z96612 Presence of left artificial shoulder joint: Secondary | ICD-10-CM

## 2022-02-09 MED ORDER — AMOXICILLIN 500 MG PO TABS: ORAL_TABLET | ORAL | 1 refills | Status: DC

## 2022-02-09 MED ORDER — OXYCODONE-ACETAMINOPHEN 5-325 MG PO TABS: 1.0000 | ORAL_TABLET | Freq: Two times a day (BID) | ORAL | 0 refills | Status: DC | PRN

## 2022-02-09 NOTE — Telephone Encounter (Signed)
Patient called. She would like a refill on oxycodone called in.

## 2022-02-09 NOTE — Telephone Encounter (Signed)
Sent in RX for oxycodone, needs to work on tapering off altogether in next couple weeks

## 2022-02-12 ENCOUNTER — Encounter: Payer: Self-pay | Admitting: Orthopedic Surgery

## 2022-02-12 NOTE — Progress Notes (Signed)
Post-Op Visit Note   Patient: Marilyn [name]           Date of Birth: October 27, 1970           MRN: [id number] Visit Date: 02/09/2022 PCP: Rebecka Apley, NP   Assessment & Plan:  Chief Complaint:  Chief Complaint  Patient presents with   Left Shoulder - Routine Post Op   Visit Diagnoses:  1. Status post reverse arthroplasty of left shoulder     Plan: Patient is a 51 year old female who presents for evaluation following left shoulder reverse shoulder arthroplasty on 11/16/2021 and multiple subsequent irrigation debridements by Dr. August Saucer and Dr. Lajoyce Corners for superficial infection.  She recently had the recently had sutures removed from her latest I&D about 3 to 4 weeks ago.  She notes pain in the shoulder that comes and goes with some days being very tolerable and some days she feels like she experiences moderate to severe shoulder pain.  She denies any fevers or chills.  No drainage from her incision.  She feels the shoulder is very stiff and she was not really able to use her CPM machine due to the multiple surgeries she has had.  She has not worked with physical therapy yet.  Taking oxycodone for pain along with Robaxin.  On exam, she has predictably stiff shoulder.  Incision looks to be healing well but she does have some very superficial areas of epidermal tissue exposed without any expressible drainage or any sign of infection such as surrounding erythema.  Intact axillary nerve with deltoid firing on exam today.  2+ radial pulse of the operative extremity.  Plan is start physical therapy at [facility] facility to focus on achieving as much passive and active range of motion as she can.  Antibiotics for dental appointment sent in for her today.  Use her CPM machine as much as she can tolerate when she is not working with physical therapy.  Follow-up with the office in 3 months for clinical recheck.  Follow-Up Instructions: No follow-ups on file.   Orders:  Orders Placed  This Encounter  Procedures   Ambulatory referral to Physical Therapy   Meds ordered this encounter  Medications   amoxicillin (AMOXIL) 500 MG tablet    Sig: Take 2 grams (#4 tablets) one hour prior to dental procedure.    Dispense:  4 tablet    Refill:  1    Imaging: No results found.  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 difficile colitis 05/22/2019   Hypophosphatemia 05/21/2019   Syncope 05/20/2019   OSA on CPAP    Acute gastroenteritis    Anemia due to blood loss, acute 06/18/2018    Class: Acute   Plantar  fasciitis of left foot 06/16/2018    Class: Chronic   Tendonitis, Achilles, right 06/16/2018    Class: Chronic   Osteochondral talar dome lesion 06/16/2018    Class: Chronic   Deep incisional surgical site infection    Superficial incisional surgical site infection 06/11/2018    Class: Acute   Right ankle effusion 06/11/2018    Class: Acute   Morbid (severe) obesity due to excess calories (HCC) 05/29/2018    Class: Chronic   Spondylolisthesis, lumbar region 05/27/2018    Class: Chronic   Spinal stenosis of lumbar region 05/27/2018    Class: Chronic   Urinary tract infection 05/27/2018    Class:  Acute   Fusion of spine of lumbar region 05/27/2018   Pain of right hip joint 07/03/2017   Acute right-sided low back pain with right-sided sciatica 03/27/2017   Chronic right shoulder pain 02/13/2017   History of rotator cuff tear 11/30/2016   Impingement syndrome of right shoulder 11/30/2016   Exacerbation of asthma 07/18/2016   Hypokalemia 07/18/2016   Hypertension 07/18/2016   Depression with anxiety 07/18/2016   Hypothyroidism 07/18/2016   Hyperglycemia 07/18/2016   Asthma, chronic 07/18/2016   Surgical wound dehiscence left hip; questionable superficial infection 11/10/2015   Postoperative wound infection 11/10/2015   Osteoarthritis of left hip 09/09/2015   Status post total replacement of left hip 09/09/2015   Obesity (BMI 35.0-39.9 without comorbidity) 04/28/2013   Past Medical History:  Diagnosis Date   Anemia    taking iron now. pt states having no current issues 09/02/2015   Anginal pain (HCC)    pt states experiences chest wall pain pt states related to her asthma    Anxiety    with MRI's   Arthritis    Everywhere   Asthma    Depression    Dizziness    GERD (gastroesophageal reflux disease)    Headache(784.0)    HX OF MIGRAINES   History of bronchitis    Hypertension    Hypothyroidism    takes levothyroxen   OSA on CPAP    wears cpap   Shortness of breath    with exertion   Wears glasses     Family History  Problem Relation Age of Onset   Cancer Mother        colon   Epilepsy Mother    Cancer Father        prostate   Diabetes Father    Hypertension Father    Hypertension Maternal Aunt    Diabetes Maternal Aunt    Hypertension Paternal Aunt     Past Surgical History:  Procedure Laterality Date   APPLICATION OF WOUND VAC  11/16/2021   Procedure: APPLICATION OF WOUND VAC;  Surgeon: Cammy Copa, MD;  Location: [facility] OR;  Service: Orthopedics;;   APPLICATION OF WOUND VAC Left 12/01/2021   Procedure: APPLICATION OF WOUND VAC;  Surgeon: Cammy Copa, MD;  Location: [facility] OR;  Service: Orthopedics;  Laterality: Left;   BACK SURGERY     CESAREAN SECTION     times 2   CHOLECYSTECTOMY     COLONOSCOPY  2020   ENDOMETRIAL ABLATION     I & D EXTREMITY Left 12/06/2021   Procedure: LEFT SHOULDER DEBRIDEMENT AND WOUND CLOSURE;  Surgeon: [name], MD;  Location: [facility] OR;  Service: Orthopedics;  Laterality: Left;   I & D EXTREMITY Left 12/20/2021   Procedure: LEFT SHOULDER DEBRIDEMENT;  Surgeon: [name], MD;  Location: [facility]  OR;  Service: Orthopedics;  Laterality: Left;   I & D EXTREMITY Left 12/22/2021   Procedure: LEFT SHOULDER DEBRIDEMENT;  Surgeon: Nadara Mustarduda, Marcus V, MD;  [facility];  Service: Orthopedics;  Laterality: Left;   INCISION AND DRAINAGE Left 12/01/2021   Procedure: LEFT SHOULDER IRRIGATION AND EXCISIONAL DEBRIDEMENT;  Surgeon: Cammy Copaean, Gregory Scott, MD;  [facility];  Service: Orthopedics;  Laterality: Left;   INCISION AND DRAINAGE HIP Left 11/10/2015   Procedure: IRRIGATION AND DEBRIDEMENT LEFT HIP INCISION;  Surgeon: Kathryne Hitchhristopher Y Blackman, MD;  Location: [facility] OR;  Service: Orthopedics;  Laterality: Left;   JOINT REPLACEMENT  2011   total left knee   KNEE ARTHROPLASTY  04/23/2012   right    KNEE ARTHROSCOPY     LUMBAR LAMINECTOMY/DECOMPRESSION MICRODISCECTOMY N/A 06/23/2019   Procedure: RIGHT LUMBAR FIVE THROUGH SACRAL ONE PARTIAL HEMILAMINECTOMY WITH RIGHT LUMBAR FIVE FORAMINOTOMY;  Surgeon: Kerrin ChampagneNitka, James E, MD;  Location: [facility] OR;  Service: Orthopedics;  Laterality: N/A;   LUMBAR WOUND DEBRIDEMENT N/A 06/11/2018   Procedure: LUMBAR WOUND DEBRIDEMENT;  Surgeon: Kerrin ChampagneNitka, James E, MD;  Location: [facility] OR;  Service: Orthopedics;  Laterality: N/A;   RADIOLOGY WITH ANESTHESIA N/A 09/09/2018   Procedure: LUMBER SPINE WITHOUT CONTRAST;  Surgeon: Radiologist, Medication, MD;  Location: [facility] OR;  Service: Radiology;  Laterality: N/A;   REVERSE SHOULDER ARTHROPLASTY Left 11/16/2021   Procedure: LEFT REVERSE SHOULDER ARTHROPLASTY;   Surgeon: Cammy Copaean, Gregory Scott, MD;  [facility];  Service: Orthopedics;  Laterality: Left;   ROTATOR CUFF REPAIR     left    SHOULDER SURGERY     right to repair ligament tear   TOTAL HIP ARTHROPLASTY Left 09/09/2015   Procedure: LEFT TOTAL HIP ARTHROPLASTY ANTERIOR APPROACH;  Surgeon: Kathryne Hitchhristopher Y Blackman, MD;  Location: [facility] ORS;  Service: Orthopedics;  Laterality: Left;   TOTAL KNEE ARTHROPLASTY  04/23/2012   Procedure: TOTAL KNEE ARTHROPLASTY;  Surgeon: Nadara MustardMarcus V Duda, MD;  Location: [facility] OR;  Service: Orthopedics;  Laterality: Right;  Right Total Knee Arthroplasty   TUBAL LIGATION  1996   Social History   Occupational History   Not on file  Tobacco Use   Smoking status: Former    Packs/day: 0.50    Years: 4.00    Pack years: 2.00    Types: Cigarettes    Quit date: 09/18/1987    Years since quitting: 34.4   Smokeless tobacco: Never  Vaping Use   Vaping Use: Never used  Substance and Sexual Activity   Alcohol use: No   Drug use: No   Sexual activity: Yes    Birth control/protection: Surgical

## 2022-02-13 ENCOUNTER — Other Ambulatory Visit (INDEPENDENT_AMBULATORY_CARE_PROVIDER_SITE_OTHER): Payer: Self-pay | Admitting: Specialist

## 2022-02-20 ENCOUNTER — Ambulatory Visit: Payer: Medicare Other | Attending: Orthopedic Surgery | Admitting: Physical Therapy

## 2022-02-20 ENCOUNTER — Encounter: Payer: Self-pay | Admitting: Physical Therapy

## 2022-02-20 DIAGNOSIS — G8929 Other chronic pain: Secondary | ICD-10-CM | POA: Insufficient documentation

## 2022-02-20 DIAGNOSIS — M25512 Pain in left shoulder: Secondary | ICD-10-CM | POA: Diagnosis present

## 2022-02-20 DIAGNOSIS — M25612 Stiffness of left shoulder, not elsewhere classified: Secondary | ICD-10-CM | POA: Insufficient documentation

## 2022-02-20 DIAGNOSIS — Z96612 Presence of left artificial shoulder joint: Secondary | ICD-10-CM | POA: Insufficient documentation

## 2022-02-20 NOTE — Therapy (Signed)
[facility] [address] [place], [place], [address] Phone: [phone]   Fax:  [phone]  Physical Therapy Evaluation  Patient Details  Name: Marilyn [name] MRN: [id number] Date of Birth: 1971-01-01 Referring Provider (PT): Danton SewerGregory Scoot Dean MD   Encounter Date: 02/20/2022   PT End of Session - 02/20/22 1304     Visit Number 1    Number of Visits 12    Date for PT Re-Evaluation 05/21/22    Authorization Type FOTO AT LEAST EVERY 5TH VISIT.  PROGRESS NOTE AT 10TH VISIT.  KX MODIFIER AFTER 15 VISITS.    PT Start Time 1120    PT Stop Time 1158    PT Time Calculation (min) 38 min    Activity Tolerance Patient tolerated treatment well    Behavior During Therapy WFL for tasks assessed/performed             Past Medical History:  Diagnosis Date   Anemia    taking iron now. pt states having no current issues 09/02/2015   Anginal pain (HCC)    pt states experiences chest wall pain pt states related to her asthma    Anxiety    with MRI's   Arthritis    Everywhere   Asthma    Depression    Dizziness    GERD (gastroesophageal reflux disease)    Headache(784.0)    HX OF MIGRAINES   History of bronchitis    Hypertension    Hypothyroidism    takes levothyroxen   OSA on CPAP    wears cpap   Shortness of breath    with exertion   Wears glasses     Past Surgical History:  Procedure Laterality Date   APPLICATION OF WOUND VAC  11/16/2021   Procedure: APPLICATION OF WOUND VAC;  Surgeon: Cammy Copaean, Gregory Scott, MD;  [facility];  Service: Orthopedics;;   APPLICATION OF WOUND VAC Left 12/01/2021   Procedure: APPLICATION OF WOUND VAC;  Surgeon: Cammy Copaean, Gregory Scott, MD;  [facility];  Service: Orthopedics;  Laterality: Left;   BACK SURGERY     CESAREAN SECTION     times 2   CHOLECYSTECTOMY     COLONOSCOPY  2020   ENDOMETRIAL ABLATION     I & D EXTREMITY Left 12/06/2021   Procedure: LEFT SHOULDER DEBRIDEMENT AND WOUND  CLOSURE;  Surgeon: Nadara Mustarduda, Marcus V, MD;  [facility]: [facility] OR;  Service: Orthopedics;  Laterality: Left;   I & D EXTREMITY Left 12/20/2021   Procedure: LEFT SHOULDER DEBRIDEMENT;  Surgeon: Nadara Mustarduda, Marcus V, MD;  [facility];  Service: Orthopedics;  Laterality: Left;   I & D EXTREMITY Left 12/22/2021   Procedure: LEFT SHOULDER DEBRIDEMENT;  Surgeon: Nadara Mustarduda, Marcus V, MD;  [facility];  Service: Orthopedics;  Laterality: Left;   INCISION AND DRAINAGE Left 12/01/2021   Procedure: LEFT SHOULDER IRRIGATION AND EXCISIONAL DEBRIDEMENT;  Surgeon: Cammy Copaean, Gregory Scott, MD;  [facility];  Service: Orthopedics;  Laterality: Left;   INCISION AND DRAINAGE HIP Left 11/10/2015   Procedure: IRRIGATION AND DEBRIDEMENT LEFT HIP INCISION;  Surgeon: Kathryne Hitchhristopher Y Blackman, MD;  [facility];  Service: Orthopedics;  Laterality: Left;   JOINT REPLACEMENT  2011   total left knee   KNEE ARTHROPLASTY  04/23/2012   right    KNEE ARTHROSCOPY     LUMBAR LAMINECTOMY/DECOMPRESSION MICRODISCECTOMY N/A 06/23/2019   Procedure: RIGHT LUMBAR FIVE THROUGH SACRAL ONE PARTIAL HEMILAMINECTOMY WITH RIGHT LUMBAR FIVE FORAMINOTOMY;  Surgeon: Vira BrownsNitka, James  E, MD;  [facility];  Service: Orthopedics;  Laterality: N/A;   LUMBAR WOUND DEBRIDEMENT N/A 06/11/2018   Procedure: LUMBAR WOUND DEBRIDEMENT;  Surgeon: Kerrin Champagne, MD;  [facility];  Service: Orthopedics;  Laterality: N/A;   RADIOLOGY WITH ANESTHESIA N/A 09/09/2018   Procedure: LUMBER SPINE WITHOUT CONTRAST;  Surgeon: Radiologist, Medication, MD;  [facility];  Service: Radiology;  Laterality: N/A;   REVERSE SHOULDER ARTHROPLASTY Left 11/16/2021   Procedure: LEFT REVERSE SHOULDER ARTHROPLASTY;  Surgeon: Cammy Copa, MD;  Location: [facility] OR;  Service: Orthopedics;  Laterality: Left;   ROTATOR CUFF REPAIR     left    SHOULDER SURGERY     right to repair ligament tear   TOTAL HIP ARTHROPLASTY Left 09/09/2015   Procedure: LEFT TOTAL HIP ARTHROPLASTY ANTERIOR  APPROACH;  Surgeon: Kathryne Hitch, MD;  [facility];  Service: Orthopedics;  Laterality: Left;   TOTAL KNEE ARTHROPLASTY  04/23/2012   Procedure: TOTAL KNEE ARTHROPLASTY;  Surgeon: [name], MD;  [facility];  Service: Orthopedics;  Laterality: Right;  Right Total Knee Arthroplasty   TUBAL LIGATION  1996    There were no vitals filed for this visit.    Subjective Assessment - 02/20/22 1307     Subjective The patient presents to the clinic today s/p left reverese total shoulder performed on 11/16/21.  She reports she had to return for multiple hospitalization due to infection with debridement and placement of wound vac.  She has a CPM machine at home that passively abducts her left shoulder but really hasn't been able to use it that much.  She states her shoulder hurts all the time.  She rates her pain at a 6/10 today and higher with movement.  She has not found anything decreases her pain at this point.    Pertinent History Hypothyroidism, back surgery, bilateral TKA's, left THA, bilateral shoulder surgeries, HTN, OA.    Patient Stated Goals Use left UE with minimal pain.    Currently in Pain? Yes    Pain Score 6     Pain Location Shoulder    Pain Orientation Left    Pain Descriptors / Indicators Aching;Sore;Shooting;Sharp;Numbness    Pain Type Chronic pain    Pain Onset More than a month ago    Pain Frequency Constant    Aggravating Factors  See above.    Pain Relieving Factors See above.                [facility] PT Assessment - 02/20/22 0001       Assessment   Medical Diagnosis S/p reverse arthroplasty of left shoulder.    Referring Provider (PT) Danton Sewer MD    Onset Date/Surgical Date 11/16/21      Precautions   Precautions --    Precaution Comments Progress per reverse total shoulder protocol.  No ultrasound.      Restrictions   Other Position/Activity Restrictions No left UE weight bearing.      Balance Screen   Has the patient fallen in  the past 6 months Yes    How many times? 3..right hip gives.  No using cane for safety.    Has the patient had a decrease in activity level because of a fear of falling?  Yes    Is the patient reluctant to leave their home because of a fear of falling?  Yes      Home Environment   Living Environment Private residence  Prior Function   Level of Independence Independent      Observation/Other Assessments   Observations Long left shoulder incsion.  Small "scabbed" area.  No drainage.    Focus on Therapeutic Outcomes (FOTO)  Complete.      ROM / Strength   AROM / PROM / Strength PROM      PROM   Overall PROM Comments In supine:  Gentle left shoulder P/AROM flexion to 95 degrees and ER to 23 degrees.      Palpation   Palpation comment Patient reports tenderness around ger proximal incisional site.                        Objective measurements completed on examination: See above findings.       [facility] Adult PT Treatment/Exercise - 02/20/22 0001       Modalities   Modalities Cryotherapy      Cryotherapy   Number Minutes Cryotherapy 10 Minutes    Cryotherapy Location --   Left shoulder.   Type of Cryotherapy Ice pack                          PT Long Term Goals - 02/20/22 1345       PT LONG TERM GOAL #1   Time 6    Period Weeks    Status New      PT LONG TERM GOAL #2   Title Left active shoulder flexion to 125 degrees so the patient can easily reach overhead.    Time 6    Period Weeks    Status New      PT LONG TERM GOAL #3   Title Left active shoulder ER to 60 degrees+ to allow for easily donning/doffing of apparel.    Time 6    Period Weeks    Status New      PT LONG TERM GOAL #4   Title Perform ADL's with left shoulder pain not > 3/10.    Time 6    Period Weeks    Status New                    Plan - 02/20/22 1325     Clinical Impression Statement The patient presents to OPPT s/p left reverse total shoulder  replacement performed on 3/2//23.  She was not able to start PT at that due to dealing with an infection.  She has a expected loss of range of motion at this time.  She also has a loss of functional use of her left UE at this time.  Her FOTO limitation is 60%.  She reports constant pain at this time.  Will progress patient per reverse total shoulder.  Patient will benefit from skilled physical therapy intervention to address pain and deficits.    Personal Factors and Comorbidities Comorbidity 1;Comorbidity 2;Other    Comorbidities Hypothyroidism, back surgery, bilateral TKA's, left THA, bilateral shoulder surgeries, HTN, OA.    Examination-Activity Limitations Other;Reach Overhead    Examination-Participation Restrictions Other    Stability/Clinical Decision Making Stable/Uncomplicated    Rehab Potential Good    PT Frequency 2x / week    PT Duration 6 weeks    PT Treatment/Interventions ADLs/Self Care Home Management;Cryotherapy;Electrical Stimulation;Moist Heat;Iontophoresis 4mg /ml Dexamethasone;Therapeutic activities;Therapeutic exercise;Patient/family education;Manual techniques;Passive range of motion;Dry needling;Vasopneumatic Device    PT Next Visit Plan Progress per reverse total shoulder protocol.  Begin with gentle left shoulder P/AROM, seated UE  Ranger progressing to standing, pulleys.    Consulted and Agree with Plan of Care Patient             Patient will benefit from skilled therapeutic intervention in order to improve the following deficits and impairments:  Pain, Decreased activity tolerance, Decreased range of motion  Visit Diagnosis: Chronic left shoulder pain  Stiffness of left shoulder, not elsewhere classified - Plan: PT plan of care cert/re-cert     Problem List Patient Active Problem List   Diagnosis Date Noted   Lactic acidosis 12/18/2021   Dyspnea on exertion 12/18/2021   Anemia 12/18/2021   Gout 12/18/2021   Acute pain of left shoulder    Arthritis of  left shoulder region    S/P reverse total shoulder arthroplasty, left 11/16/2021   Unilateral primary osteoarthritis, right hip 05/09/2021   S/P lumbar spinal fusion 05/09/2021   S/P insertion of spinal cord stimulator 05/09/2021   H/O total knee replacement, bilateral 01/18/2021   Lumbar post-laminectomy syndrome 04/06/2020   Long-term current use of opiate analgesic 11/04/2019   Leg pain, right 10/20/2019   Dysesthesia 10/20/2019   Lateral femoral cutaneous neuropathy, 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 difficile colitis 05/22/2019   Hypophosphatemia 05/21/2019   Syncope 05/20/2019   OSA on CPAP    Acute gastroenteritis    Anemia due to blood loss, acute 06/18/2018    Class: Acute   Plantar fasciitis of left foot 06/16/2018    Class: Chronic   Tendonitis, Achilles, right 06/16/2018    Class: Chronic   Osteochondral talar dome lesion 06/16/2018    Class: Chronic   Deep incisional surgical site infection    Superficial incisional surgical site infection 06/11/2018    Class: Acute   Right ankle effusion 06/11/2018    Class: Acute   Morbid (severe) obesity due to excess calories (HCC) 05/29/2018    Class: Chronic   Spondylolisthesis, lumbar region 05/27/2018    Class: Chronic   Spinal stenosis of lumbar region 05/27/2018    Class: Chronic   Urinary tract infection 05/27/2018    Class: Acute   Fusion of spine of lumbar region 05/27/2018   Pain of right hip joint 07/03/2017   Acute right-sided low back pain with right-sided sciatica 03/27/2017   Chronic right shoulder pain 02/13/2017   History of rotator cuff tear 11/30/2016   Impingement syndrome of right shoulder 11/30/2016   Exacerbation of asthma 07/18/2016   Hypokalemia 07/18/2016   Hypertension 07/18/2016   Depression with anxiety 07/18/2016   Hypothyroidism 07/18/2016   Hyperglycemia 07/18/2016   Asthma, chronic 07/18/2016    Surgical wound dehiscence left hip; questionable superficial infection 11/10/2015   Postoperative wound infection 11/10/2015   Osteoarthritis of left hip 09/09/2015   Status post total replacement of left hip 09/09/2015   Obesity (BMI 35.0-39.9 without comorbidity) 04/28/2013   Rationale for Evaluation and Treatment Rehabilitation  Cloyd Ragas, [place], PT 02/20/2022, 1:48 PM  [facility] [address] [place], [place], [address] Phone: [phone]   Fax:  [phone]  Name: Marilyn [name] MRN: [id number] Date of Birth: 11-13-1970

## 2022-02-23 ENCOUNTER — Other Ambulatory Visit: Payer: Self-pay | Admitting: Surgical

## 2022-02-23 MED ORDER — OXYCODONE-ACETAMINOPHEN 5-325 MG PO TABS: 1.0000 | ORAL_TABLET | Freq: Two times a day (BID) | ORAL | 0 refills | Status: DC | PRN

## 2022-02-26 ENCOUNTER — Ambulatory Visit: Payer: Medicare Other | Admitting: Surgical

## 2022-02-28 ENCOUNTER — Encounter: Payer: Medicare Other | Admitting: Physical Therapy

## 2022-03-02 ENCOUNTER — Encounter: Payer: Medicare Other | Admitting: Physical Therapy

## 2022-03-05 ENCOUNTER — Ambulatory Visit: Payer: Medicare Other | Admitting: Orthopaedic Surgery

## 2022-03-06 ENCOUNTER — Ambulatory Visit: Payer: Medicare Other | Admitting: *Deleted

## 2022-03-06 DIAGNOSIS — M25612 Stiffness of left shoulder, not elsewhere classified: Secondary | ICD-10-CM

## 2022-03-06 DIAGNOSIS — M25512 Pain in left shoulder: Secondary | ICD-10-CM | POA: Diagnosis not present

## 2022-03-06 DIAGNOSIS — G8929 Other chronic pain: Secondary | ICD-10-CM

## 2022-03-06 NOTE — Therapy (Signed)
[facility] [address] [place], [place], [address] Phone: [phone]   Fax:  [phone]  Physical Therapy Treatment  Patient Details  Name: Marilyn [name] MRN: [id number] Date of Birth: [birth year] Referring Provider (PT): Danton Sewer MD   Encounter Date: 03/06/2022   PT End of Session - 03/06/22 1127     Visit Number 2    Number of Visits 12    Date for PT Re-Evaluation 05/21/22    Authorization Type FOTO AT LEAST EVERY 5TH VISIT.  PROGRESS NOTE AT 10TH VISIT.  KX MODIFIER AFTER 15 VISITS.    PT Start Time 1120    PT Stop Time 1205    PT Time Calculation (min) 45 min             Past Medical History:  Diagnosis Date   Anemia    taking iron now. pt states having no current issues 09/02/2015   Anginal pain (HCC)    pt states experiences chest wall pain pt states related to her asthma    Anxiety    with MRI's   Arthritis    Everywhere   Asthma    Depression    Dizziness    GERD (gastroesophageal reflux disease)    Headache(784.0)    HX OF MIGRAINES   History of bronchitis    Hypertension    Hypothyroidism    takes levothyroxen   OSA on CPAP    wears cpap   Shortness of breath    with exertion   Wears glasses     Past Surgical History:  Procedure Laterality Date   APPLICATION OF WOUND VAC  11/16/2021   Procedure: APPLICATION OF WOUND VAC;  Surgeon: Cammy Copa, MD;  Location[facility] OR;  Service: Orthopedics;;   APPLICATION OF WOUND VAC Left 12/01/2021   Procedure: APPLICATION OF WOUND VAC;  Surgeon: Cammy Copa, MD;  [facility];  Service: Orthopedics;  Laterality: Left;   BACK SURGERY     CESAREAN SECTION     times 2   CHOLECYSTECTOMY     COLONOSCOPY  2020   ENDOMETRIAL ABLATION     I & D EXTREMITY Left 12/06/2021   Procedure: LEFT SHOULDER DEBRIDEMENT AND WOUND CLOSURE;  Surgeon: [name], MD;  [facility];  Service: Orthopedics;  Laterality: Left;   I & D EXTREMITY  Left 12/20/2021   Procedure: LEFT SHOULDER DEBRIDEMENT;  Surgeon: [name], MD;  Location: [facility] OR;  Service: Orthopedics;  Laterality: Left;   I & D EXTREMITY Left 12/22/2021   Procedure: LEFT SHOULDER DEBRIDEMENT;  Surgeon: [name], MD;  Location: [facility] OR;  Service: Orthopedics;  Laterality: Left;   INCISION AND DRAINAGE Left 12/01/2021   Procedure: LEFT SHOULDER IRRIGATION AND EXCISIONAL DEBRIDEMENT;  Surgeon: Cammy Copa, MD;  Location: [facility] OR;  Service: Orthopedics;  Laterality: Left;   INCISION AND DRAINAGE HIP Left 11/10/2015   Procedure: IRRIGATION AND DEBRIDEMENT LEFT HIP INCISION;  Surgeon: Kathryne Hitch, MD;  [facility]: [facility];  Service: Orthopedics;  Laterality: Left;   JOINT REPLACEMENT  2011   total left knee   KNEE ARTHROPLASTY  04/23/2012   right    KNEE ARTHROSCOPY     LUMBAR LAMINECTOMY/DECOMPRESSION MICRODISCECTOMY N/A 06/23/2019   Procedure: RIGHT LUMBAR FIVE THROUGH SACRAL ONE PARTIAL HEMILAMINECTOMY WITH RIGHT LUMBAR FIVE FORAMINOTOMY;  Surgeon: Kerrin Champagne, MD;  [facility];  Service: Orthopedics;  Laterality: N/A;   LUMBAR WOUND DEBRIDEMENT N/A 06/11/2018  Procedure: LUMBAR WOUND DEBRIDEMENT;  Surgeon: Jessy Oto, MD;  Location: [place];  Service: Orthopedics;  Laterality: N/A;   RADIOLOGY WITH ANESTHESIA N/A 09/09/2018   Procedure: LUMBER SPINE WITHOUT CONTRAST;  Surgeon: Radiologist, Medication, MD;  Location: [place];  Service: Radiology;  Laterality: N/A;   REVERSE SHOULDER ARTHROPLASTY Left 11/16/2021   Procedure: LEFT REVERSE SHOULDER ARTHROPLASTY;  Surgeon: Meredith Pel, MD;  Location: [place];  Service: Orthopedics;  Laterality: Left;   ROTATOR CUFF REPAIR     left    SHOULDER SURGERY     right to repair ligament tear   TOTAL HIP ARTHROPLASTY Left 09/09/2015   Procedure: LEFT TOTAL HIP ARTHROPLASTY ANTERIOR APPROACH;  Surgeon: Mcarthur Rossetti, MD;  Location: [facility];  Service: Orthopedics;  Laterality: Left;   TOTAL KNEE  ARTHROPLASTY  04/23/2012   Procedure: TOTAL KNEE ARTHROPLASTY;  Surgeon: Newt Minion, MD;  Location: [place];  Service: Orthopedics;  Laterality: Right;  Right Total Knee Arthroplasty   TUBAL LIGATION  1996    There were no vitals filed for this visit.   Subjective Assessment - 03/06/22 1126     Subjective LT shldr pain 6/10    Pertinent History Hypothyroidism, back surgery, bilateral TKA's, left THA, bilateral shoulder surgeries, HTN, OA.    Patient Stated Goals Use left UE with minimal pain.    Currently in Pain? Yes    Pain Score 6     Pain Location Shoulder    Pain Orientation Left    Pain Descriptors / Indicators Aching;Sore;Shooting    Pain Type Chronic pain    Pain Onset More than a month ago                               [facility] Adult PT Treatment/Exercise - 03/06/22 0001       Exercises   Exercises Shoulder      Shoulder Exercises: Supine   Other Supine Exercises HEP reviewed with added table top exs.      Shoulder Exercises: Pulleys   Flexion Limitations seated ranger x 6 mins for flex/ext, CW/CCW motions      Manual Therapy   Manual Therapy Passive ROM    Passive ROM PROM and AAROM performed IR/ER and elevation with increased mm activation end of session.                          PT Long Term Goals - 02/20/22 1345       PT LONG TERM GOAL #1   Time 6    Period Weeks    Status New      PT LONG TERM GOAL #2   Title Left active shoulder flexion to 125 degrees so the patient can easily reach overhead.    Time 6    Period Weeks    Status New      PT LONG TERM GOAL #3   Title Left active shoulder ER to 60 degrees+ to allow for easily donning/doffing of apparel.    Time 6    Period Weeks    Status New      PT LONG TERM GOAL #4   Title Perform ADL's with left shoulder pain not > 3/10.    Time 6    Period Weeks    Status New  Plan - 03/06/22 1716     Clinical Impression Statement Pt  arrived today doing fair with LT shldr. Rx focused on AAROM exs as well as manual  PROM and AAROM. HEP also reviewed and table top exs were added. Very challenging for Pt to raise LT UE without assistance.    Personal Factors and Comorbidities Comorbidity 1;Comorbidity 2;Other    Comorbidities Hypothyroidism, back surgery, bilateral TKA's, left THA, bilateral shoulder surgeries, HTN, OA.    Examination-Participation Restrictions Other    Stability/Clinical Decision Making Stable/Uncomplicated    Rehab Potential Good    PT Frequency 2x / week    PT Duration 6 weeks    PT Treatment/Interventions ADLs/Self Care Home Management;Cryotherapy;Electrical Stimulation;Moist Heat;Iontophoresis 4mg /ml Dexamethasone;Therapeutic activities;Therapeutic exercise;Patient/family education;Manual techniques;Passive range of motion;Dry needling;Vasopneumatic Device    PT Next Visit Plan Progress per reverse total shoulder protocol.  Begin with gentle left shoulder P/AROM, seated UE Ranger progressing to standing, pulleys.    Consulted and Agree with Plan of Care Patient             Patient will benefit from skilled therapeutic intervention in order to improve the following deficits and impairments:  Pain, Decreased activity tolerance, Decreased range of motion  Visit Diagnosis: Chronic left shoulder pain  Stiffness of left shoulder, not elsewhere classified     Problem List Patient Active Problem List   Diagnosis Date Noted   Lactic acidosis 12/18/2021   Dyspnea on exertion 12/18/2021   Anemia 12/18/2021   Gout 12/18/2021   Acute pain of left shoulder    Arthritis of left shoulder region    S/P reverse total shoulder arthroplasty, left 11/16/2021   Unilateral primary osteoarthritis, right hip 05/09/2021   S/P lumbar spinal fusion 05/09/2021   S/P insertion of spinal cord stimulator 05/09/2021   H/O total knee replacement, bilateral 01/18/2021   Lumbar post-laminectomy syndrome 04/06/2020    Long-term current use of opiate analgesic 11/04/2019   Leg pain, right 10/20/2019   Dysesthesia 10/20/2019   Lateral femoral cutaneous neuropathy, 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 difficile colitis 05/22/2019   Hypophosphatemia 05/21/2019   Syncope 05/20/2019   OSA on CPAP    Acute gastroenteritis    Anemia due to blood loss, acute 06/18/2018    Class: Acute   Plantar fasciitis of left foot 06/16/2018    Class: Chronic   Tendonitis, Achilles, right 06/16/2018    Class: Chronic   Osteochondral talar dome lesion 06/16/2018    Class: Chronic   Deep incisional surgical site infection    Superficial incisional surgical site infection 06/11/2018    Class: Acute   Right ankle effusion 06/11/2018    Class: Acute   Morbid (severe) obesity due to excess calories (HCC) 05/29/2018    Class: Chronic   Spondylolisthesis, lumbar region 05/27/2018    Class: Chronic   Spinal stenosis of lumbar region 05/27/2018    Class: Chronic   Urinary tract infection 05/27/2018    Class: Acute   Fusion of spine of lumbar region 05/27/2018   Pain of right hip joint 07/03/2017   Acute right-sided low back pain with right-sided sciatica 03/27/2017   Chronic right shoulder pain 02/13/2017   History of rotator cuff tear 11/30/2016   Impingement syndrome of right shoulder 11/30/2016   Exacerbation of asthma 07/18/2016   Hypokalemia 07/18/2016   Hypertension 07/18/2016   Depression with anxiety 07/18/2016   Hypothyroidism 07/18/2016  Hyperglycemia 07/18/2016   Asthma, chronic 07/18/2016   Surgical wound dehiscence left hip; questionable superficial infection 11/10/2015   Postoperative wound infection 11/10/2015   Osteoarthritis of left hip 09/09/2015   Status post total replacement of left hip 09/09/2015   Obesity (BMI 35.0-39.9 without comorbidity) 04/28/2013   Rationale for Evaluation and Treatment Rehabilitation   Briauna Gilmartin,CHRIS, PTA 03/06/2022, [facility] [address] [place], [name], [address] Phone: [phone]   Fax:  [phone]  Name: KAILER MACLIN MRN: [id number] Date of Birth: [birth year]

## 2022-03-07 ENCOUNTER — Ambulatory Visit: Payer: Medicare Other

## 2022-03-07 ENCOUNTER — Encounter: Payer: Self-pay | Admitting: Surgery

## 2022-03-07 ENCOUNTER — Ambulatory Visit (INDEPENDENT_AMBULATORY_CARE_PROVIDER_SITE_OTHER): Payer: Medicare Other | Admitting: Surgery

## 2022-03-07 VITALS — BP 142/68 | HR 75 | Ht 63.0 in | Wt 370.0 lb

## 2022-03-07 DIAGNOSIS — M1611 Unilateral primary osteoarthritis, right hip: Secondary | ICD-10-CM | POA: Diagnosis not present

## 2022-03-07 DIAGNOSIS — Z981 Arthrodesis status: Secondary | ICD-10-CM | POA: Diagnosis not present

## 2022-03-07 DIAGNOSIS — M25551 Pain in right hip: Secondary | ICD-10-CM | POA: Diagnosis not present

## 2022-03-08 ENCOUNTER — Ambulatory Visit: Payer: Medicare Other | Admitting: Physical Therapy

## 2022-03-08 DIAGNOSIS — G8929 Other chronic pain: Secondary | ICD-10-CM

## 2022-03-08 DIAGNOSIS — M25512 Pain in left shoulder: Secondary | ICD-10-CM | POA: Diagnosis not present

## 2022-03-08 DIAGNOSIS — M25612 Stiffness of left shoulder, not elsewhere classified: Secondary | ICD-10-CM

## 2022-03-08 NOTE — Therapy (Signed)
[facility] [address] [place], [place], [address] Phone: [phone]   Fax:  [phone]  Physical Therapy Treatment  Patient Details  Name: Marilyn [name] MRN: [id number] Date of Birth: 05-20-1971 Referring Provider (PT): Danton Sewer MD   Encounter Date: 03/08/2022   PT End of Session - 03/08/22 1335     Visit Number 3    Number of Visits 12    Date for PT Re-Evaluation 05/21/22    Authorization Type FOTO AT LEAST EVERY 5TH VISIT.  PROGRESS NOTE AT 10TH VISIT.  KX MODIFIER AFTER 15 VISITS.    PT Start Time 0103    PT Stop Time 0141    PT Time Calculation (min) 38 min    Activity Tolerance Patient tolerated treatment well    Behavior During Therapy WFL for tasks assessed/performed             Past Medical History:  Diagnosis Date   Anemia    taking iron now. pt states having no current issues 09/02/2015   Anginal pain (HCC)    pt states experiences chest wall pain pt states related to her asthma    Anxiety    with MRI's   Arthritis    Everywhere   Asthma    Depression    Dizziness    GERD (gastroesophageal reflux disease)    Headache(784.0)    HX OF MIGRAINES   History of bronchitis    Hypertension    Hypothyroidism    takes levothyroxen   OSA on CPAP    wears cpap   Shortness of breath    with exertion   Wears glasses     Past Surgical History:  Procedure Laterality Date   APPLICATION OF WOUND VAC  11/16/2021   Procedure: APPLICATION OF WOUND VAC;  Surgeon: Cammy Copa, MD;  Location: [facility] OR;  Service: Orthopedics;;   APPLICATION OF WOUND VAC Left 12/01/2021   Procedure: APPLICATION OF WOUND VAC;  Surgeon: Cammy Copa, MD;  Location: [facility] OR;  Service: Orthopedics;  Laterality: Left;   BACK SURGERY     CESAREAN SECTION     times 2   CHOLECYSTECTOMY     COLONOSCOPY  2020   ENDOMETRIAL ABLATION     I & D EXTREMITY Left 12/06/2021   Procedure: LEFT SHOULDER DEBRIDEMENT AND WOUND  CLOSURE;  Surgeon: [name], MD;  Location: [facility] OR;  Service: Orthopedics;  Laterality: Left;   I & D EXTREMITY Left 12/20/2021   Procedure: LEFT SHOULDER DEBRIDEMENT;  Surgeon: [name], MD;  Location[facility] OR;  Service: Orthopedics;  Laterality: Left;   I & D EXTREMITY Left 12/22/2021   Procedure: LEFT SHOULDER DEBRIDEMENT;  Surgeon: [name], MD;  Location: [facility] OR;  Service: Orthopedics;  Laterality: Left;   INCISION AND DRAINAGE Left 12/01/2021   Procedure: LEFT SHOULDER IRRIGATION AND EXCISIONAL DEBRIDEMENT;  Surgeon: Cammy Copa, MD;  Location: [facility] OR;  Service: Orthopedics;  Laterality: Left;   INCISION AND DRAINAGE HIP Left 11/10/2015   Procedure: IRRIGATION AND DEBRIDEMENT LEFT HIP INCISION;  Surgeon: Kathryne Hitch, MD;  Location: [facility] OR;  Service: Orthopedics;  Laterality: Left;   JOINT REPLACEMENT  2011   total left knee   KNEE ARTHROPLASTY  04/23/2012   right    KNEE ARTHROSCOPY     LUMBAR LAMINECTOMY/DECOMPRESSION MICRODISCECTOMY N/A 06/23/2019   Procedure: RIGHT LUMBAR FIVE THROUGH SACRAL ONE PARTIAL HEMILAMINECTOMY WITH RIGHT LUMBAR FIVE FORAMINOTOMY;  Surgeon: Vira Browns  E, MD;  Location: [facility] OR;  Service: Orthopedics;  Laterality: N/A;   LUMBAR WOUND DEBRIDEMENT N/A 06/11/2018   Procedure: LUMBAR WOUND DEBRIDEMENT;  Surgeon: Kerrin Champagne, MD;  [facility]: [facility] OR;  Service: Orthopedics;  Laterality: N/A;   RADIOLOGY WITH ANESTHESIA N/A 09/09/2018   Procedure: LUMBER SPINE WITHOUT CONTRAST;  Surgeon: Radiologist, Medication, MD;  Location: [facility] OR;  Service: Radiology;  Laterality: N/A;   REVERSE SHOULDER ARTHROPLASTY Left 11/16/2021   Procedure: LEFT REVERSE SHOULDER ARTHROPLASTY;  Surgeon: Cammy Copa, MD;  Location: [facility] OR;  Service: Orthopedics;  Laterality: Left;   ROTATOR CUFF REPAIR     left    SHOULDER SURGERY     right to repair ligament tear   TOTAL HIP ARTHROPLASTY Left 09/09/2015   Procedure: LEFT TOTAL HIP ARTHROPLASTY ANTERIOR  APPROACH;  Surgeon: Kathryne Hitch, MD;  Location: [facility] ORS;  Service: Orthopedics;  Laterality: Left;   TOTAL KNEE ARTHROPLASTY  04/23/2012   Procedure: TOTAL KNEE ARTHROPLASTY;  Surgeon: [name], MD;  Location: [facility] OR;  Service: Orthopedics;  Laterality: Right;  Right Total Knee Arthroplasty   TUBAL LIGATION  1996    There were no vitals filed for this visit.   Subjective Assessment - 03/08/22 1335     Subjective No new complaints.    Pertinent History Hypothyroidism, back surgery, bilateral TKA's, left THA, bilateral shoulder surgeries, HTN, OA.    Currently in Pain? Yes    Pain Score 6     Pain Location Shoulder    Pain Orientation Left    Pain Descriptors / Indicators Aching;Sore;Shooting    Pain Type Chronic pain    Pain Onset More than a month ago                               [facility] Adult PT Treatment/Exercise - 03/08/22 0001       Exercises   Exercises Shoulder      Shoulder Exercises: Seated   Other Seated Exercises Seated UE Ranger x 7 minutes.      Shoulder Exercises: Pulleys   Flexion --   4 minutes.     Cryotherapy   Number Minutes Cryotherapy 10 Minutes    Cryotherapy Location --   Left shoulder.   Type of Cryotherapy Ice pack      Manual Therapy   Manual Therapy Passive ROM    Passive ROM PROM and AA punches to patient's left shoulder x 12 minutes.                          PT Long Term Goals - 02/20/22 1345       PT LONG TERM GOAL #1   Time 6    Period Weeks    Status New      PT LONG TERM GOAL #2   Title Left active shoulder flexion to 125 degrees so the patient can easily reach overhead.    Time 6    Period Weeks    Status New      PT LONG TERM GOAL #3   Title Left active shoulder ER to 60 degrees+ to allow for easily donning/doffing of apparel.    Time 6    Period Weeks    Status New      PT LONG TERM GOAL #4   Title Perform ADL's with left shoulder pain not > 3/10.    Time 6  Period  Weeks    Status New                   Plan - 03/08/22 1338     Clinical Impression Statement Patient doing about the same.  Patient able to perform left shoulder punches to 90 degrees with minimal + manual assist.    Personal Factors and Comorbidities Comorbidity 1;Comorbidity 2;Other    Comorbidities Hypothyroidism, back surgery, bilateral TKA's, left THA, bilateral shoulder surgeries, HTN, OA.    Examination-Activity Limitations Other;Reach Overhead    Examination-Participation Restrictions Other    Stability/Clinical Decision Making Stable/Uncomplicated    Rehab Potential Good    PT Frequency 2x / week    PT Duration 6 weeks    PT Treatment/Interventions ADLs/Self Care Home Management;Cryotherapy;Electrical Stimulation;Moist Heat;Iontophoresis 4mg /ml Dexamethasone;Therapeutic activities;Therapeutic exercise;Patient/family education;Manual techniques;Passive range of motion;Dry needling;Vasopneumatic Device    PT Next Visit Plan Progress per reverse total shoulder protocol.  Begin with gentle left shoulder P/AROM, seated UE Ranger progressing to standing, pulleys.    Consulted and Agree with Plan of Care Patient             Patient will benefit from skilled therapeutic intervention in order to improve the following deficits and impairments:  Pain, Decreased activity tolerance, Decreased range of motion  Visit Diagnosis: Chronic left shoulder pain  Stiffness of left shoulder, not elsewhere classified     Problem List Patient Active Problem List   Diagnosis Date Noted   Lactic acidosis 12/18/2021   Dyspnea on exertion 12/18/2021   Anemia 12/18/2021   Gout 12/18/2021   Acute pain of left shoulder    Arthritis of left shoulder region    S/P reverse total shoulder arthroplasty, left 11/16/2021   Unilateral primary osteoarthritis, right hip 05/09/2021   S/P lumbar spinal fusion 05/09/2021   S/P insertion of spinal cord stimulator 05/09/2021   H/O total knee  replacement, bilateral 01/18/2021   Lumbar post-laminectomy syndrome 04/06/2020   Long-term current use of opiate analgesic 11/04/2019   Leg pain, right 10/20/2019   Dysesthesia 10/20/2019   Lateral femoral cutaneous neuropathy, 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 difficile colitis 05/22/2019   Hypophosphatemia 05/21/2019   Syncope 05/20/2019   OSA on CPAP    Acute gastroenteritis    Anemia due to blood loss, acute 06/18/2018    Class: Acute   Plantar fasciitis of left foot 06/16/2018    Class: Chronic   Tendonitis, Achilles, right 06/16/2018    Class: Chronic   Osteochondral talar dome lesion 06/16/2018    Class: Chronic   Deep incisional surgical site infection    Superficial incisional surgical site infection 06/11/2018    Class: Acute   Right ankle effusion 06/11/2018    Class: Acute   Morbid (severe) obesity due to excess calories (HCC) 05/29/2018    Class: Chronic   Spondylolisthesis, lumbar region 05/27/2018    Class: Chronic   Spinal stenosis of lumbar region 05/27/2018    Class: Chronic   Urinary tract infection 05/27/2018    Class: Acute   Fusion of spine of lumbar region 05/27/2018   Pain of right hip joint 07/03/2017   Acute right-sided low back pain with right-sided sciatica 03/27/2017   Chronic right shoulder pain 02/13/2017   History of rotator cuff tear 11/30/2016   Impingement syndrome of right shoulder 11/30/2016   Exacerbation of asthma 07/18/2016   Hypokalemia 07/18/2016   Hypertension  07/18/2016   Depression with anxiety 07/18/2016   Hypothyroidism 07/18/2016   Hyperglycemia 07/18/2016   Asthma, chronic 07/18/2016   Surgical wound dehiscence left hip; questionable superficial infection 11/10/2015   Postoperative wound infection 11/10/2015   Osteoarthritis of left hip 09/09/2015   Status post total replacement of left hip 09/09/2015   Obesity (BMI 35.0-39.9  without comorbidity) 04/28/2013   Rationale for Evaluation and Treatment Rehabilitation.  Beryl Hornberger, [place], PT 03/08/2022, 1:55 PM  [facility] [address] [place], [place], [address] Phone: [phone]   Fax:  [phone]  Name: HAYLEEN CLINKSCALES MRN: [id number] Date of Birth: Sep 16, 1971

## 2022-03-13 ENCOUNTER — Encounter: Payer: Self-pay | Admitting: Physical Therapy

## 2022-03-13 ENCOUNTER — Ambulatory Visit: Payer: Medicare Other | Admitting: Physical Therapy

## 2022-03-13 DIAGNOSIS — G8929 Other chronic pain: Secondary | ICD-10-CM

## 2022-03-13 DIAGNOSIS — M25612 Stiffness of left shoulder, not elsewhere classified: Secondary | ICD-10-CM

## 2022-03-13 DIAGNOSIS — M25512 Pain in left shoulder: Secondary | ICD-10-CM | POA: Diagnosis not present

## 2022-03-16 ENCOUNTER — Encounter: Payer: Self-pay | Admitting: Physical Therapy

## 2022-03-16 ENCOUNTER — Ambulatory Visit: Payer: Medicare Other | Admitting: Physical Therapy

## 2022-03-16 DIAGNOSIS — M25612 Stiffness of left shoulder, not elsewhere classified: Secondary | ICD-10-CM

## 2022-03-16 DIAGNOSIS — M25512 Pain in left shoulder: Secondary | ICD-10-CM | POA: Diagnosis not present

## 2022-03-16 DIAGNOSIS — G8929 Other chronic pain: Secondary | ICD-10-CM

## 2022-03-16 NOTE — Therapy (Signed)
OUTPATIENT PHYSICAL THERAPY TREATMENT NOTE   Patient Name: Marilyn [name] MRN: [id number] DOB:Aug 10, 1971, 51 y.o., female Today's Date: 03/16/2022   REFERRING PROVIDER: Cammy Copa, MD   PT End of Session - 03/16/22 1108     Visit Number 5    Number of Visits 12    Date for PT Re-Evaluation 05/21/22    Authorization Type FOTO AT LEAST EVERY 5TH VISIT.  PROGRESS NOTE AT 10TH VISIT.  KX MODIFIER AFTER 15 VISITS.    PT Start Time 1121    PT Stop Time 1203    PT Time Calculation (min) 42 min    Activity Tolerance Patient tolerated treatment well    Behavior During Therapy WFL for tasks assessed/performed             Past Medical History:  Diagnosis Date   Anemia    taking iron now. pt states having no current issues 09/02/2015   Anginal pain (HCC)    pt states experiences chest wall pain pt states related to her asthma    Anxiety    with MRI's   Arthritis    Everywhere   Asthma    Depression    Dizziness    GERD (gastroesophageal reflux disease)    Headache(784.0)    HX OF MIGRAINES   History of bronchitis    Hypertension    Hypothyroidism    takes levothyroxen   OSA on CPAP    wears cpap   Shortness of breath    with exertion   Wears glasses    Past Surgical History:  Procedure Laterality Date   APPLICATION OF WOUND VAC  11/16/2021   Procedure: APPLICATION OF WOUND VAC;  Surgeon: Cammy Copa, MD;  Location: [facility] OR;  Service: Orthopedics;;   APPLICATION OF WOUND VAC Left 12/01/2021   Procedure: APPLICATION OF WOUND VAC;  Surgeon: Cammy Copa, MD;  Location: [facility] OR;  Service: Orthopedics;  Laterality: Left;   BACK SURGERY     CESAREAN SECTION     times 2   CHOLECYSTECTOMY     COLONOSCOPY  2020   ENDOMETRIAL ABLATION     I & D EXTREMITY Left 12/06/2021   Procedure: LEFT SHOULDER DEBRIDEMENT AND WOUND CLOSURE;  Surgeon: [name], MD;  Location: [facility] OR;  Service: Orthopedics;  Laterality: Left;   I & D EXTREMITY Left 12/20/2021    Procedure: LEFT SHOULDER DEBRIDEMENT;  Surgeon: [name], MD;  Location: [facility] OR;  Service: Orthopedics;  Laterality: Left;   I & D EXTREMITY Left 12/22/2021   Procedure: LEFT SHOULDER DEBRIDEMENT;  Surgeon: [name], MD;  Location: [facility] OR;  Service: Orthopedics;  Laterality: Left;   INCISION AND DRAINAGE Left 12/01/2021   Procedure: LEFT SHOULDER IRRIGATION AND EXCISIONAL DEBRIDEMENT;  Surgeon: Cammy Copa, MD;  Location: [facility] OR;  Service: Orthopedics;  Laterality: Left;   INCISION AND DRAINAGE HIP Left 11/10/2015   Procedure: IRRIGATION AND DEBRIDEMENT LEFT HIP INCISION;  Surgeon: Kathryne Hitch, MD;  Location: [facility] OR;  Service: Orthopedics;  Laterality: Left;   JOINT REPLACEMENT  2011   total left knee   KNEE ARTHROPLASTY  04/23/2012   right    KNEE ARTHROSCOPY     LUMBAR LAMINECTOMY/DECOMPRESSION MICRODISCECTOMY N/A 06/23/2019   Procedure: RIGHT LUMBAR FIVE THROUGH SACRAL ONE PARTIAL HEMILAMINECTOMY WITH RIGHT LUMBAR FIVE FORAMINOTOMY;  Surgeon: Kerrin Champagne, MD;  Location: [facility] OR;  Service: Orthopedics;  Laterality: N/A;   LUMBAR WOUND DEBRIDEMENT N/A 06/11/2018  Procedure: LUMBAR WOUND DEBRIDEMENT;  Surgeon: Kerrin Champagne, MD;  Location: [facility] OR;  Service: Orthopedics;  Laterality: N/A;   RADIOLOGY WITH ANESTHESIA N/A 09/09/2018   Procedure: LUMBER SPINE WITHOUT CONTRAST;  Surgeon: Radiologist, Medication, MD;  Location: [facility] OR;  Service: Radiology;  Laterality: N/A;   REVERSE SHOULDER ARTHROPLASTY Left 11/16/2021   Procedure: LEFT REVERSE SHOULDER ARTHROPLASTY;  Surgeon: Cammy Copa, MD;  Location: [facility] OR;  Service: Orthopedics;  Laterality: Left;   ROTATOR CUFF REPAIR     left    SHOULDER SURGERY     right to repair ligament tear   TOTAL HIP ARTHROPLASTY Left 09/09/2015   Procedure: LEFT TOTAL HIP ARTHROPLASTY ANTERIOR APPROACH;  Surgeon: Kathryne Hitch, MD;  Location: [facility];  Service: Orthopedics;  Laterality: Left;   TOTAL KNEE ARTHROPLASTY   04/23/2012   Procedure: TOTAL KNEE ARTHROPLASTY;  Surgeon: [name], MD;  Location: [facility] OR;  Service: Orthopedics;  Laterality: Right;  Right Total Knee Arthroplasty   TUBAL LIGATION  1996   Patient Active Problem List   Diagnosis Date Noted   Lactic acidosis 12/18/2021   Dyspnea on exertion 12/18/2021   Anemia 12/18/2021   Gout 12/18/2021   Acute pain of left shoulder    Arthritis of left shoulder region    S/P reverse total shoulder arthroplasty, left 11/16/2021   Unilateral primary osteoarthritis, right hip 05/09/2021   S/P lumbar spinal fusion 05/09/2021   S/P insertion of spinal cord stimulator 05/09/2021   H/O total knee replacement, bilateral 01/18/2021   Lumbar post-laminectomy syndrome 04/06/2020   Long-term current use of opiate analgesic 11/04/2019   Leg pain, right 10/20/2019   Dysesthesia 10/20/2019   Lateral femoral cutaneous neuropathy, 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 difficile colitis 05/22/2019   Hypophosphatemia 05/21/2019   Syncope 05/20/2019   OSA on CPAP    Acute gastroenteritis    Anemia due to blood loss, acute 06/18/2018    Class: Acute   Plantar fasciitis of left foot 06/16/2018    Class: Chronic   Tendonitis, Achilles, right 06/16/2018    Class: Chronic   Osteochondral talar dome lesion 06/16/2018    Class: Chronic   Deep incisional surgical site infection    Superficial incisional surgical site infection 06/11/2018    Class: Acute   Right ankle effusion 06/11/2018    Class: Acute   Morbid (severe) obesity due to excess calories (HCC) 05/29/2018    Class: Chronic   Spondylolisthesis, lumbar region 05/27/2018    Class: Chronic   Spinal stenosis of lumbar region 05/27/2018    Class: Chronic   Urinary tract infection 05/27/2018    Class: Acute   Fusion of spine of lumbar region 05/27/2018   Pain of right hip joint 07/03/2017   Acute right-sided low  back pain with right-sided sciatica 03/27/2017   Chronic right shoulder pain 02/13/2017   History of rotator cuff tear 11/30/2016   Impingement syndrome of right shoulder 11/30/2016   Exacerbation of asthma 07/18/2016   Hypokalemia 07/18/2016   Hypertension 07/18/2016   Depression with anxiety 07/18/2016   Hypothyroidism 07/18/2016   Hyperglycemia 07/18/2016   Asthma, chronic 07/18/2016   Surgical wound dehiscence left hip; questionable superficial infection 11/10/2015   Postoperative wound infection 11/10/2015   Osteoarthritis of left hip 09/09/2015   Status post total replacement of left hip 09/09/2015   Obesity (BMI 35.0-39.9 without comorbidity) 04/28/2013  REFERRING DIAG: chronic left shoulder pain, stiffness of left shoulder, not elsewhere classified  THERAPY DIAG:  Chronic left shoulder pain  Stiffness of left shoulder, not elsewhere classified  Rationale for Evaluation and Treatment Rehabilitation  PERTINENT HISTORY: Hypothyroidism, back surgery, bilateral TKA's, left THA, bilateral shoulder surgeries, HTN, OA.   PRECAUTIONS: Progress per revere shoulder replacement protocol. No ultrasound.  SUBJECTIVE: "Feel like crap" and did not have a good night of sleep. Had to take a pain pill for shoulder pain although she is trying not to have to use medication.  PAIN:  Are you having pain? Yes: NPRS scale: 4/10 Pain location: L shoulder Pain description: constant sore Aggravating factors: none Relieving factors: rest     TODAY'S TREATMENT:                                     EXERCISE LOG  Exercise Repetitions and Resistance Comments  Pulley X5 min   UE ranger  Seated; flex x3 min, CW/ CCW circles x3 min   Tables slides Actively on tray; flex x20 reps, ER x20 reps More difficult with flexion due to discomfort extending away from her  L bicep curl AROM x20 reps   B shoulder protraction AAROM x20 reps    Blank cell = exercise not performed today    Modalities  Unattended Estim: Shoulder, IFC 80-150 hz, 10 mins, Pain        PT Long Term Goals - 03/16/22 1108       PT LONG TERM GOAL #1   Title Patient will be independent with HEP    Time 6    Period Weeks    Status On-going      PT LONG TERM GOAL #2   Title Left active shoulder flexion to 125 degrees so the patient can easily reach overhead.    Time 6    Period Weeks    Status On-going      PT LONG TERM GOAL #3   Title Left active shoulder ER to 60 degrees+ to allow for easily donning/doffing of apparel.    Time 6    Period Weeks    Status On-going      PT LONG TERM GOAL #4   Title Perform ADL's with left shoulder pain not > 3/10.    Time 6    Period Weeks    Status On-going              Plan - 03/16/22 1111     Clinical Impression Statement Patient presented in clinic with reports of continued L shoulder pain as well as LBP and knee pain last night. Patient still limited with any active use of LUE against gravity and requires assist for any use of the LUE such as going to cross her body to scratch RUE. Patient indicating that incision wound is becoming stiff and wondering if consultation with wound care facility may be needed. Normal stimulation response noted following removal of the modality.   Personal Factors and Comorbidities Comorbidity 1;Comorbidity 2;Other    Comorbidities Hypothyroidism, back surgery, bilateral TKA's, left THA, bilateral shoulder surgeries, HTN, OA.    Examination-Activity Limitations Other;Reach Overhead    Examination-Participation Restrictions Other    Stability/Clinical Decision Making Stable/Uncomplicated    Rehab Potential Good    PT Frequency 2x / week    PT Duration 6 weeks    PT Treatment/Interventions ADLs/Self Care Home Management;Cryotherapy;Electrical Stimulation;Moist Heat;Iontophoresis  4mg /ml Dexamethasone;Therapeutic activities;Therapeutic exercise;Patient/family education;Manual techniques;Passive range of  motion;Dry needling;Vasopneumatic Device    PT Next Visit Plan Progress per reverse total shoulder protocol.  Begin with gentle left shoulder P/AROM, seated UE Ranger progressing to standing, pulleys.    Consulted and Agree with Plan of Care Patient             , PTA 03/16/22 12:07 PM

## 2022-03-19 ENCOUNTER — Ambulatory Visit: Payer: Medicare Other | Attending: Orthopedic Surgery | Admitting: Physical Therapy

## 2022-03-19 ENCOUNTER — Encounter: Payer: Self-pay | Admitting: Physical Therapy

## 2022-03-19 DIAGNOSIS — M25512 Pain in left shoulder: Secondary | ICD-10-CM | POA: Diagnosis not present

## 2022-03-19 DIAGNOSIS — G8929 Other chronic pain: Secondary | ICD-10-CM | POA: Insufficient documentation

## 2022-03-19 DIAGNOSIS — M25612 Stiffness of left shoulder, not elsewhere classified: Secondary | ICD-10-CM | POA: Insufficient documentation

## 2022-03-19 NOTE — Therapy (Signed)
OUTPATIENT PHYSICAL THERAPY TREATMENT NOTE   Patient Name: Marilyn [name] MRN: [id number] DOB:10/28/1970, 51 y.o., female Today's Date: 03/19/2022   REFERRING PROVIDER: Cammy Copa, MD   PT End of Session - 03/16/22 1108     Visit Number 6   Number of Visits 12    Date for PT Re-Evaluation 05/21/22    Authorization Type FOTO AT LEAST EVERY 5TH VISIT.  PROGRESS NOTE AT 10TH VISIT.  KX MODIFIER AFTER 15 VISITS.    PT Start Time 1203   PT Stop Time 1244   PT Time Calculation (min) 41   Activity Tolerance Patient tolerated treatment well    Behavior During Therapy WFL for tasks assessed/performed             Past Medical History:  Diagnosis Date   Anemia    taking iron now. pt states having no current issues 09/02/2015   Anginal pain (HCC)    pt states experiences chest wall pain pt states related to her asthma    Anxiety    with MRI's   Arthritis    Everywhere   Asthma    Depression    Dizziness    GERD (gastroesophageal reflux disease)    Headache(784.0)    HX OF MIGRAINES   History of bronchitis    Hypertension    Hypothyroidism    takes levothyroxen   OSA on CPAP    wears cpap   Shortness of breath    with exertion   Wears glasses    Past Surgical History:  Procedure Laterality Date   APPLICATION OF WOUND VAC  11/16/2021   Procedure: APPLICATION OF WOUND VAC;  Surgeon: Cammy Copa, MD;  Location: [facility] OR;  Service: Orthopedics;;   APPLICATION OF WOUND VAC Left 12/01/2021   Procedure: APPLICATION OF WOUND VAC;  Surgeon: Cammy Copa, MD;  Location: [facility] OR;  Service: Orthopedics;  Laterality: Left;   BACK SURGERY     CESAREAN SECTION     times 2   CHOLECYSTECTOMY     COLONOSCOPY  2020   ENDOMETRIAL ABLATION     I & D EXTREMITY Left 12/06/2021   Procedure: LEFT SHOULDER DEBRIDEMENT AND WOUND CLOSURE;  Surgeon: [name], MD;  Location: [facility] OR;  Service: Orthopedics;  Laterality: Left;   I & D EXTREMITY Left 12/20/2021   Procedure:  LEFT SHOULDER DEBRIDEMENT;  Surgeon: [name], MD;  Location: [facility] OR;  Service: Orthopedics;  Laterality: Left;   I & D EXTREMITY Left 12/22/2021   Procedure: LEFT SHOULDER DEBRIDEMENT;  Surgeon: [name], MD;  Location: [facility] OR;  Service: Orthopedics;  Laterality: Left;   INCISION AND DRAINAGE Left 12/01/2021   Procedure: LEFT SHOULDER IRRIGATION AND EXCISIONAL DEBRIDEMENT;  Surgeon: Cammy Copa, MD;  Location: [facility] OR;  Service: Orthopedics;  Laterality: Left;   INCISION AND DRAINAGE HIP Left 11/10/2015   Procedure: IRRIGATION AND DEBRIDEMENT LEFT HIP INCISION;  Surgeon: Kathryne Hitch, MD;  Location: [facility] OR;  Service: Orthopedics;  Laterality: Left;   JOINT REPLACEMENT  2011   total left knee   KNEE ARTHROPLASTY  04/23/2012   right    KNEE ARTHROSCOPY     LUMBAR LAMINECTOMY/DECOMPRESSION MICRODISCECTOMY N/A 06/23/2019   Procedure: RIGHT LUMBAR FIVE THROUGH SACRAL ONE PARTIAL HEMILAMINECTOMY WITH RIGHT LUMBAR FIVE FORAMINOTOMY;  Surgeon: Kerrin Champagne, MD;  Location: [facility] OR;  Service: Orthopedics;  Laterality: N/A;   LUMBAR WOUND DEBRIDEMENT N/A 06/11/2018   Procedure: LUMBAR WOUND DEBRIDEMENT;  Surgeon: Kerrin Champagne, MD;  Location: [facility] OR;  Service: Orthopedics;  Laterality: N/A;   RADIOLOGY WITH ANESTHESIA N/A 09/09/2018   Procedure: LUMBER SPINE WITHOUT CONTRAST;  Surgeon: Radiologist, Medication, MD;  Location: [facility] OR;  Service: Radiology;  Laterality: N/A;   REVERSE SHOULDER ARTHROPLASTY Left 11/16/2021   Procedure: LEFT REVERSE SHOULDER ARTHROPLASTY;  Surgeon: Cammy Copa, MD;  Location: [facility] OR;  Service: Orthopedics;  Laterality: Left;   ROTATOR CUFF REPAIR     left    SHOULDER SURGERY     right to repair ligament tear   TOTAL HIP ARTHROPLASTY Left 09/09/2015   Procedure: LEFT TOTAL HIP ARTHROPLASTY ANTERIOR APPROACH;  Surgeon: Kathryne Hitch, MD;  Location: [facility] ORS;  Service: Orthopedics;  Laterality: Left;   TOTAL KNEE ARTHROPLASTY  04/23/2012    Procedure: TOTAL KNEE ARTHROPLASTY;  Surgeon: [name], MD;  Location: [facility] OR;  Service: Orthopedics;  Laterality: Right;  Right Total Knee Arthroplasty   TUBAL LIGATION  1996   Patient Active Problem List   Diagnosis Date Noted   Lactic acidosis 12/18/2021   Dyspnea on exertion 12/18/2021   Anemia 12/18/2021   Gout 12/18/2021   Acute pain of left shoulder    Arthritis of left shoulder region    S/P reverse total shoulder arthroplasty, left 11/16/2021   Unilateral primary osteoarthritis, right hip 05/09/2021   S/P lumbar spinal fusion 05/09/2021   S/P insertion of spinal cord stimulator 05/09/2021   H/O total knee replacement, bilateral 01/18/2021   Lumbar post-laminectomy syndrome 04/06/2020   Long-term current use of opiate analgesic 11/04/2019   Leg pain, right 10/20/2019   Dysesthesia 10/20/2019   Lateral femoral cutaneous neuropathy, 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 difficile colitis 05/22/2019   Hypophosphatemia 05/21/2019   Syncope 05/20/2019   OSA on CPAP    Acute gastroenteritis    Anemia due to blood loss, acute 06/18/2018    Class: Acute   Plantar fasciitis of left foot 06/16/2018    Class: Chronic   Tendonitis, Achilles, right 06/16/2018    Class: Chronic   Osteochondral talar dome lesion 06/16/2018    Class: Chronic   Deep incisional surgical site infection    Superficial incisional surgical site infection 06/11/2018    Class: Acute   Right ankle effusion 06/11/2018    Class: Acute   Morbid (severe) obesity due to excess calories (HCC) 05/29/2018    Class: Chronic   Spondylolisthesis, lumbar region 05/27/2018    Class: Chronic   Spinal stenosis of lumbar region 05/27/2018    Class: Chronic   Urinary tract infection 05/27/2018    Class: Acute   Fusion of spine of lumbar region 05/27/2018   Pain of right hip joint 07/03/2017   Acute right-sided low back pain with  right-sided sciatica 03/27/2017   Chronic right shoulder pain 02/13/2017   History of rotator cuff tear 11/30/2016   Impingement syndrome of right shoulder 11/30/2016   Exacerbation of asthma 07/18/2016   Hypokalemia 07/18/2016   Hypertension 07/18/2016   Depression with anxiety 07/18/2016   Hypothyroidism 07/18/2016   Hyperglycemia 07/18/2016   Asthma, chronic 07/18/2016   Surgical wound dehiscence left hip; questionable superficial infection 11/10/2015   Postoperative wound infection 11/10/2015   Osteoarthritis of left hip 09/09/2015   Status post total replacement of left hip 09/09/2015   Obesity (BMI 35.0-39.9 without comorbidity) 04/28/2013    REFERRING DIAG: chronic left shoulder  pain, stiffness of left shoulder, not elsewhere classified  THERAPY DIAG:  Chronic left shoulder pain  Stiffness of left shoulder, not elsewhere classified  Rationale for Evaluation and Treatment Rehabilitation  PERTINENT HISTORY: Hypothyroidism, back surgery, bilateral TKA's, left THA, bilateral shoulder surgeries, HTN, OA.   PRECAUTIONS: Progress per revere shoulder replacement protocol. No ultrasound.  SUBJECTIVE: Reports her shoulder feels " alright" today.  PAIN:  Are you having pain? Yes: NPRS scale: 2/10 Pain location: L shoulder Pain description: constant sore Aggravating factors: none Relieving factors: rest     TODAY'S TREATMENT:                                     EXERCISE LOG  Exercise Repetitions and Resistance Comments  Pulley X5 min   UE ranger  Seated; flex x3 min, CW/ CCW circles x3 min   Tables slides Actively on tray; flex x20 reps, ER x20 reps More difficult with flexion due to discomfort extending away from her  L bicep curl AROM x20 reps   L upper cut AROM x20 reps   B shoulder protraction AAROM x20 reps    Blank cell = exercise not performed today   Modalities  Unattended Estim: Shoulder, IFC 80-150 hz, 10 mins, Pain        PT Long Term Goals -  03/16/22 1108       PT LONG TERM GOAL #1   Title Patient will be independent with HEP    Time 6    Period Weeks    Status On-going      PT LONG TERM GOAL #2   Title Left active shoulder flexion to 125 degrees so the patient can easily reach overhead.    Time 6    Period Weeks    Status On-going      PT LONG TERM GOAL #3   Title Left active shoulder ER to 60 degrees+ to allow for easily donning/doffing of apparel.    Time 6    Period Weeks    Status On-going      PT LONG TERM GOAL #4   Title Perform ADL's with left shoulder pain not > 3/10.    Time 6    Period Weeks    Status On-going              Plan - 03/16/22 1111     Clinical Impression Statement Patient presented in clinic with mild L shoulder pain and able to complete all AROM and AAROM exercises. Limited incisional mobility with patient demonstrating scar mobilizations. Open wound still present in mid incision. Normal stimulation response noted following removal of the modality.   Personal Factors and Comorbidities Comorbidity 1;Comorbidity 2;Other    Comorbidities Hypothyroidism, back surgery, bilateral TKA's, left THA, bilateral shoulder surgeries, HTN, OA.    Examination-Activity Limitations Other;Reach Overhead    Examination-Participation Restrictions Other    Stability/Clinical Decision Making Stable/Uncomplicated    Rehab Potential Good    PT Frequency 2x / week    PT Duration 6 weeks    PT Treatment/Interventions ADLs/Self Care Home Management;Cryotherapy;Electrical Stimulation;Moist Heat;Iontophoresis 4mg /ml Dexamethasone;Therapeutic activities;Therapeutic exercise;Patient/family education;Manual techniques;Passive range of motion;Dry needling;Vasopneumatic Device    PT Next Visit Plan Progress per reverse total shoulder protocol.  Begin with gentle left shoulder P/AROM, seated UE Ranger progressing to standing, pulleys.    Consulted and Agree with Plan of Care Patient  Marvell Fuller,  PTA 03/19/22 12:04 PM

## 2022-03-22 ENCOUNTER — Ambulatory Visit: Payer: Medicare Other | Admitting: Physical Therapy

## 2022-03-22 ENCOUNTER — Encounter: Payer: Self-pay | Admitting: Physical Therapy

## 2022-03-22 DIAGNOSIS — G8929 Other chronic pain: Secondary | ICD-10-CM

## 2022-03-22 DIAGNOSIS — M25512 Pain in left shoulder: Secondary | ICD-10-CM | POA: Diagnosis not present

## 2022-03-22 DIAGNOSIS — M25612 Stiffness of left shoulder, not elsewhere classified: Secondary | ICD-10-CM

## 2022-03-22 NOTE — Therapy (Addendum)
OUTPATIENT PHYSICAL THERAPY TREATMENT NOTE   Patient Name: Marilyn [name] MRN: [id number] DOB:[birth year], 51 y.o., female Today's Date: 03/22/2022   REFERRING PROVIDER: Cammy Copa, MD   PT End of Session - 03/16/22 1108     Visit Number 7   Number of Visits 12    Date for PT Re-Evaluation 05/21/22    Authorization Type FOTO AT LEAST EVERY 5TH VISIT.  PROGRESS NOTE AT 10TH VISIT.  KX MODIFIER AFTER 15 VISITS.    PT Start Time 1309   PT Stop Time 1349   PT Time Calculation (min) 40 min   Activity Tolerance Patient tolerated treatment well    Behavior During Therapy WFL for tasks assessed/performed             Past Medical History:  Diagnosis Date   Anemia    taking iron now. pt states having no current issues 09/02/2015   Anginal pain (HCC)    pt states experiences chest wall pain pt states related to her asthma    Anxiety    with MRI's   Arthritis    Everywhere   Asthma    Depression    Dizziness    GERD (gastroesophageal reflux disease)    Headache(784.0)    HX OF MIGRAINES   History of bronchitis    Hypertension    Hypothyroidism    takes levothyroxen   OSA on CPAP    wears cpap   Shortness of breath    with exertion   Wears glasses    Past Surgical History:  Procedure Laterality Date   APPLICATION OF WOUND VAC  11/16/2021   Procedure: APPLICATION OF WOUND VAC;  Surgeon: Cammy Copa, MD;  Location: [facility] OR;  Service: Orthopedics;;   APPLICATION OF WOUND VAC Left 12/01/2021   Procedure: APPLICATION OF WOUND VAC;  Surgeon: Cammy Copa, MD;  Location: [facility] OR;  Service: Orthopedics;  Laterality: Left;   BACK SURGERY     CESAREAN SECTION     times 2   CHOLECYSTECTOMY     COLONOSCOPY  2020   ENDOMETRIAL ABLATION     I & D EXTREMITY Left 12/06/2021   Procedure: LEFT SHOULDER DEBRIDEMENT AND WOUND CLOSURE;  Surgeon: [name], MD;  Location: [facility] OR;  Service: Orthopedics;  Laterality: Left;   I & D EXTREMITY Left 12/20/2021    Procedure: LEFT SHOULDER DEBRIDEMENT;  Surgeon: [name], MD;  Location: [facility] OR;  Service: Orthopedics;  Laterality: Left;   I & D EXTREMITY Left 12/22/2021   Procedure: LEFT SHOULDER DEBRIDEMENT;  Surgeon: [name], MD;  Location: [facility] OR;  Service: Orthopedics;  Laterality: Left;   INCISION AND DRAINAGE Left 12/01/2021   Procedure: LEFT SHOULDER IRRIGATION AND EXCISIONAL DEBRIDEMENT;  Surgeon: Cammy Copa, MD;  Location: [facility] OR;  Service: Orthopedics;  Laterality: Left;   INCISION AND DRAINAGE HIP Left 11/10/2015   Procedure: IRRIGATION AND DEBRIDEMENT LEFT HIP INCISION;  Surgeon: Kathryne Hitch, MD;  Location: [facility] OR;  Service: Orthopedics;  Laterality: Left;   JOINT REPLACEMENT  2011   total left knee   KNEE ARTHROPLASTY  04/23/2012   right    KNEE ARTHROSCOPY     LUMBAR LAMINECTOMY/DECOMPRESSION MICRODISCECTOMY N/A 06/23/2019   Procedure: RIGHT LUMBAR FIVE THROUGH SACRAL ONE PARTIAL HEMILAMINECTOMY WITH RIGHT LUMBAR FIVE FORAMINOTOMY;  Surgeon: Kerrin Champagne, MD;  Location: [facility] OR;  Service: Orthopedics;  Laterality: N/A;   LUMBAR WOUND DEBRIDEMENT N/A 06/11/2018   Procedure: LUMBAR WOUND DEBRIDEMENT;  Surgeon: Kerrin Champagne, MD;  Location: [facility] OR;  Service: Orthopedics;  Laterality: N/A;   RADIOLOGY WITH ANESTHESIA N/A 09/09/2018   Procedure: LUMBER SPINE WITHOUT CONTRAST;  Surgeon: Radiologist, Medication, MD;  Location: [facility] OR;  Service: Radiology;  Laterality: N/A;   REVERSE SHOULDER ARTHROPLASTY Left 11/16/2021   Procedure: LEFT REVERSE SHOULDER ARTHROPLASTY;  Surgeon: Cammy Copa, MD;  Location: [facility] OR;  Service: Orthopedics;  Laterality: Left;   ROTATOR CUFF REPAIR     left    SHOULDER SURGERY     right to repair ligament tear   TOTAL HIP ARTHROPLASTY Left 09/09/2015   Procedure: LEFT TOTAL HIP ARTHROPLASTY ANTERIOR APPROACH;  Surgeon: Kathryne Hitch, MD;  Location: [facility];  Service: Orthopedics;  Laterality: Left;   TOTAL KNEE ARTHROPLASTY   04/23/2012   Procedure: TOTAL KNEE ARTHROPLASTY;  Surgeon: [name], MD;  Location: [facility] OR;  Service: Orthopedics;  Laterality: Right;  Right Total Knee Arthroplasty   TUBAL LIGATION  1996   Patient Active Problem List   Diagnosis Date Noted   Lactic acidosis 12/18/2021   Dyspnea on exertion 12/18/2021   Anemia 12/18/2021   Gout 12/18/2021   Acute pain of left shoulder    Arthritis of left shoulder region    S/P reverse total shoulder arthroplasty, left 11/16/2021   Unilateral primary osteoarthritis, right hip 05/09/2021   S/P lumbar spinal fusion 05/09/2021   S/P insertion of spinal cord stimulator 05/09/2021   H/O total knee replacement, bilateral 01/18/2021   Lumbar post-laminectomy syndrome 04/06/2020   Long-term current use of opiate analgesic 11/04/2019   Leg pain, right 10/20/2019   Dysesthesia 10/20/2019   Lateral femoral cutaneous neuropathy, 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 difficile colitis 05/22/2019   Hypophosphatemia 05/21/2019   Syncope 05/20/2019   OSA on CPAP    Acute gastroenteritis    Anemia due to blood loss, acute 06/18/2018    Class: Acute   Plantar fasciitis of left foot 06/16/2018    Class: Chronic   Tendonitis, Achilles, right 06/16/2018    Class: Chronic   Osteochondral talar dome lesion 06/16/2018    Class: Chronic   Deep incisional surgical site infection    Superficial incisional surgical site infection 06/11/2018    Class: Acute   Right ankle effusion 06/11/2018    Class: Acute   Morbid (severe) obesity due to excess calories (HCC) 05/29/2018    Class: Chronic   Spondylolisthesis, lumbar region 05/27/2018    Class: Chronic   Spinal stenosis of lumbar region 05/27/2018    Class: Chronic   Urinary tract infection 05/27/2018    Class: Acute   Fusion of spine of lumbar region 05/27/2018   Pain of right hip joint 07/03/2017   Acute right-sided low  back pain with right-sided sciatica 03/27/2017   Chronic right shoulder pain 02/13/2017   History of rotator cuff tear 11/30/2016   Impingement syndrome of right shoulder 11/30/2016   Exacerbation of asthma 07/18/2016   Hypokalemia 07/18/2016   Hypertension 07/18/2016   Depression with anxiety 07/18/2016   Hypothyroidism 07/18/2016   Hyperglycemia 07/18/2016   Asthma, chronic 07/18/2016   Surgical wound dehiscence left hip; questionable superficial infection 11/10/2015   Postoperative wound infection 11/10/2015   Osteoarthritis of left hip 09/09/2015   Status post total replacement of left hip 09/09/2015   Obesity (BMI 35.0-39.9 without comorbidity) 04/28/2013    REFERRING DIAG: chronic left shoulder  pain, stiffness of left shoulder, not elsewhere classified  THERAPY DIAG:  Chronic left shoulder pain  Stiffness of left shoulder, not elsewhere classified  Rationale for Evaluation and Treatment Rehabilitation  PERTINENT HISTORY: Hypothyroidism, back surgery, bilateral TKA's, left THA, bilateral shoulder surgeries, HTN, OA.   PRECAUTIONS: Progress per revere shoulder replacement protocol. No ultrasound.  SUBJECTIVE: Reports her shoulder feels " alright" today. Hasn't done much today.  PAIN:  Are you having pain? Yes: NPRS scale: 2-3/10 Pain location: L shoulder Pain description: constant sore Aggravating factors: none Relieving factors: rest     TODAY'S TREATMENT:                                     EXERCISE LOG  Exercise Repetitions and Resistance Comments  Pulley X5 min   UE ranger  Seated; flex x4 min, CW/ CCW circles x3 min   Tables slides (on incline for flexion) Actively on tray; flex x 30 reps, ER x 30 reps   L bicep curl AROM x20 reps   L upper cut AROM x20 reps   B shoulder protraction AROM x20 reps    Blank cell = exercise not performed today   Modalities  Unattended Estim: Shoulder, IFC 80-150 hz, 10 mins, Pain        PT Long Term Goals -  03/16/22 1108       PT LONG TERM GOAL #1   Title Patient will be independent with HEP    Time 6    Period Weeks    Status On-going      PT LONG TERM GOAL #2   Title Left active shoulder flexion to 125 degrees so the patient can easily reach overhead.    Time 6    Period Weeks    Status On-going      PT LONG TERM GOAL #3   Title Left active shoulder ER to 60 degrees+ to allow for easily donning/doffing of apparel.    Time 6    Period Weeks    Status On-going      PT LONG TERM GOAL #4   Title Perform ADL's with left shoulder pain not > 3/10.    Time 6    Period Weeks    Status On-going              Plan - 03/16/22 1111     Clinical Impression Statement Patient presented in clinic with reports of mild L shoulder pain. Patient now worried as superior most incision turning red. Wound still present in mid incision although observed with what looks like closer margins. Shoulder flexion progressed to incline flexion via tray and wedge. No other limitations other than weakness and elbow extension limits noted with therex. Normal stimulation response noted following removal of the modality.   Personal Factors and Comorbidities Comorbidity 1;Comorbidity 2;Other    Comorbidities Hypothyroidism, back surgery, bilateral TKA's, left THA, bilateral shoulder surgeries, HTN, OA.    Examination-Activity Limitations Other;Reach Overhead    Examination-Participation Restrictions Other    Stability/Clinical Decision Making Stable/Uncomplicated    Rehab Potential Good    PT Frequency 2x / week    PT Duration 6 weeks    PT Treatment/Interventions ADLs/Self Care Home Management;Cryotherapy;Electrical Stimulation;Moist Heat;Iontophoresis 4mg /ml Dexamethasone;Therapeutic activities;Therapeutic exercise;Patient/family education;Manual techniques;Passive range of motion;Dry needling;Vasopneumatic Device    PT Next Visit Plan Progress per reverse total shoulder protocol.  Begin with gentle left  shoulder P/AROM, seated  UE Ranger progressing to standing, pulleys.    Consulted and Agree with Plan of Care Patient             Marvell Fuller, PTA 03/22/22 1:54 PM

## 2022-03-27 ENCOUNTER — Ambulatory Visit: Payer: Medicare Other | Admitting: *Deleted

## 2022-03-27 ENCOUNTER — Encounter: Payer: Self-pay | Admitting: *Deleted

## 2022-03-27 DIAGNOSIS — M25512 Pain in left shoulder: Secondary | ICD-10-CM | POA: Diagnosis not present

## 2022-03-27 DIAGNOSIS — G8929 Other chronic pain: Secondary | ICD-10-CM

## 2022-03-27 DIAGNOSIS — M25612 Stiffness of left shoulder, not elsewhere classified: Secondary | ICD-10-CM

## 2022-03-27 NOTE — Therapy (Signed)
OUTPATIENT PHYSICAL THERAPY TREATMENT NOTE   Patient Name: Marilyn [name] MRN: [id number] DOB:Feb 19, 1971, 51 y.o., female Today's Date: 03/27/2022   REFERRING PROVIDER: Cammy Copa, MD   PT End of Session - 03/27/22 1108     Visit Number 7   Number of Visits 12    Date for PT Re-Evaluation 05/21/22    Authorization Type FOTO AT LEAST EVERY 5TH VISIT.  PROGRESS NOTE AT 10TH VISIT.  KX MODIFIER AFTER 15 VISITS.    PT Start Time 1130   PT Stop Time 1210   PT Time Calculation (min) 40 min   Activity Tolerance Patient tolerated treatment well    Behavior During Therapy WFL for tasks assessed/performed             Past Medical History:  Diagnosis Date   Anemia    taking iron now. pt states having no current issues 09/02/2015   Anginal pain (HCC)    pt states experiences chest wall pain pt states related to her asthma    Anxiety    with MRI's   Arthritis    Everywhere   Asthma    Depression    Dizziness    GERD (gastroesophageal reflux disease)    Headache(784.0)    HX OF MIGRAINES   History of bronchitis    Hypertension    Hypothyroidism    takes levothyroxen   OSA on CPAP    wears cpap   Shortness of breath    with exertion   Wears glasses    Past Surgical History:  Procedure Laterality Date   APPLICATION OF WOUND VAC  11/16/2021   Procedure: APPLICATION OF WOUND VAC;  Surgeon: Cammy Copa, MD;  Location: [facility] OR;  Service: Orthopedics;;   APPLICATION OF WOUND VAC Left 12/01/2021   Procedure: APPLICATION OF WOUND VAC;  Surgeon: Cammy Copa, MD;  Location: [facility] OR;  Service: Orthopedics;  Laterality: Left;   BACK SURGERY     CESAREAN SECTION     times 2   CHOLECYSTECTOMY     COLONOSCOPY  2020   ENDOMETRIAL ABLATION     I & D EXTREMITY Left 12/06/2021   Procedure: LEFT SHOULDER DEBRIDEMENT AND WOUND CLOSURE;  Surgeon: [name], MD;  Location: [facility] OR;  Service: Orthopedics;  Laterality: Left;   I & D EXTREMITY Left 12/20/2021    Procedure: LEFT SHOULDER DEBRIDEMENT;  Surgeon: [name], MD;  Location: [facility] OR;  Service: Orthopedics;  Laterality: Left;   I & D EXTREMITY Left 12/22/2021   Procedure: LEFT SHOULDER DEBRIDEMENT;  Surgeon: [name], MD;  Location: [facility] OR;  Service: Orthopedics;  Laterality: Left;   INCISION AND DRAINAGE Left 12/01/2021   Procedure: LEFT SHOULDER IRRIGATION AND EXCISIONAL DEBRIDEMENT;  Surgeon: Cammy Copa, MD;  Location: [facility] OR;  Service: Orthopedics;  Laterality: Left;   INCISION AND DRAINAGE HIP Left 11/10/2015   Procedure: IRRIGATION AND DEBRIDEMENT LEFT HIP INCISION;  Surgeon: Kathryne Hitch, MD;  Location: [facility] OR;  Service: Orthopedics;  Laterality: Left;   JOINT REPLACEMENT  2011   total left knee   KNEE ARTHROPLASTY  04/23/2012   right    KNEE ARTHROSCOPY     LUMBAR LAMINECTOMY/DECOMPRESSION MICRODISCECTOMY N/A 06/23/2019   Procedure: RIGHT LUMBAR FIVE THROUGH SACRAL ONE PARTIAL HEMILAMINECTOMY WITH RIGHT LUMBAR FIVE FORAMINOTOMY;  Surgeon: Kerrin Champagne, MD;  Location: [facility] OR;  Service: Orthopedics;  Laterality: N/A;   LUMBAR WOUND DEBRIDEMENT N/A 06/11/2018   Procedure: LUMBAR WOUND DEBRIDEMENT;  Surgeon: Kerrin Champagne, MD;  Location: [facility] OR;  Service: Orthopedics;  Laterality: N/A;   RADIOLOGY WITH ANESTHESIA N/A 09/09/2018   Procedure: LUMBER SPINE WITHOUT CONTRAST;  Surgeon: Radiologist, Medication, MD;  Location: [facility] OR;  Service: Radiology;  Laterality: N/A;   REVERSE SHOULDER ARTHROPLASTY Left 11/16/2021   Procedure: LEFT REVERSE SHOULDER ARTHROPLASTY;  Surgeon: Cammy Copa, MD;  Location: [facility] OR;  Service: Orthopedics;  Laterality: Left;   ROTATOR CUFF REPAIR     left    SHOULDER SURGERY     right to repair ligament tear   TOTAL HIP ARTHROPLASTY Left 09/09/2015   Procedure: LEFT TOTAL HIP ARTHROPLASTY ANTERIOR APPROACH;  Surgeon: Kathryne Hitch, MD;  Location: [facility] OR[facility];  Service: Orthopedics;  Laterality: Left;   TOTAL KNEE ARTHROPLASTY   04/23/2012   Procedure: TOTAL KNEE ARTHROPLASTY;  Surgeon: [name], MD;  Location: [facility] OR;  Service: Orthopedics;  Laterality: Right;  Right Total Knee Arthroplasty   TUBAL LIGATION  1996   Patient Active Problem List   Diagnosis Date Noted   Lactic acidosis 12/18/2021   Dyspnea on exertion 12/18/2021   Anemia 12/18/2021   Gout 12/18/2021   Acute pain of left shoulder    Arthritis of left shoulder region    S/P reverse total shoulder arthroplasty, left 11/16/2021   Unilateral primary osteoarthritis, right hip 05/09/2021   S/P lumbar spinal fusion 05/09/2021   S/P insertion of spinal cord stimulator 05/09/2021   H/O total knee replacement, bilateral 01/18/2021   Lumbar post-laminectomy syndrome 04/06/2020   Long-term current use of opiate analgesic 11/04/2019   Leg pain, right 10/20/2019   Dysesthesia 10/20/2019   Lateral femoral cutaneous neuropathy, 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 difficile colitis 05/22/2019   Hypophosphatemia 05/21/2019   Syncope 05/20/2019   OSA on CPAP    Acute gastroenteritis    Anemia due to blood loss, acute 06/18/2018    Class: Acute   Plantar fasciitis of left foot 06/16/2018    Class: Chronic   Tendonitis, Achilles, right 06/16/2018    Class: Chronic   Osteochondral talar dome lesion 06/16/2018    Class: Chronic   Deep incisional surgical site infection    Superficial incisional surgical site infection 06/11/2018    Class: Acute   Right ankle effusion 06/11/2018    Class: Acute   Morbid (severe) obesity due to excess calories (HCC) 05/29/2018    Class: Chronic   Spondylolisthesis, lumbar region 05/27/2018    Class: Chronic   Spinal stenosis of lumbar region 05/27/2018    Class: Chronic   Urinary tract infection 05/27/2018    Class: Acute   Fusion of spine of lumbar region 05/27/2018   Pain of right hip joint 07/03/2017   Acute right-sided low  back pain with right-sided sciatica 03/27/2017   Chronic right shoulder pain 02/13/2017   History of rotator cuff tear 11/30/2016   Impingement syndrome of right shoulder 11/30/2016   Exacerbation of asthma 07/18/2016   Hypokalemia 07/18/2016   Hypertension 07/18/2016   Depression with anxiety 07/18/2016   Hypothyroidism 07/18/2016   Hyperglycemia 07/18/2016   Asthma, chronic 07/18/2016   Surgical wound dehiscence left hip; questionable superficial infection 11/10/2015   Postoperative wound infection 11/10/2015   Osteoarthritis of left hip 09/09/2015   Status post total replacement of left hip 09/09/2015   Obesity (BMI 35.0-39.9 without comorbidity) 04/28/2013    REFERRING DIAG: chronic left shoulder  pain, stiffness of left shoulder, not elsewhere classified  THERAPY DIAG:  Chronic left shoulder pain  Stiffness of left shoulder, not elsewhere classified  Rationale for Evaluation and Treatment Rehabilitation  PERTINENT HISTORY: Hypothyroidism, back surgery, bilateral TKA's, left THA, bilateral shoulder surgeries, HTN, OA.   PRECAUTIONS: Progress per reverse shoulder replacement protocol. No ultrasound. No lifting over 20#s  SUBJECTIVE: Reports her LT shldr doing better. Incision still healing  PAIN:  Are you having pain? Yes: NPRS scale: 2-3/10 Pain location: L shoulder Pain description: constant sore Aggravating factors: none Relieving factors: rest     TODAY'S TREATMENT:                                     EXERCISE LOG    03-27-22  Exercise Repetitions and Resistance Comments  Pulley X5 min   UE ranger LT Seated; flex x4 min, CW/ CCW circles x3 min   Tables slide ball roll (on incline for flexion) standing flex x 30 reps,Red ball   L bicep curl 2# 3x10 reps   L upper cut AROM x10 reps, AAROM x10   B shoulder protraction     Blank cell = exercise not performed today   Modalities  Unattended Estim: Shoulder, IFC 80-150 hz, 10 mins, Pain        PT Long  Term Goals - 03/16/22 1108       PT LONG TERM GOAL #1   Title Patient will be independent with HEP    Time 6    Period Weeks    Status On-going      PT LONG TERM GOAL #2   Title Left active shoulder flexion to 125 degrees so the patient can easily reach overhead.    Time 6    Period Weeks    Status On-going      PT LONG TERM GOAL #3   Title Left active shoulder ER to 60 degrees+ to allow for easily donning/doffing of apparel.    Time 6    Period Weeks    Status On-going      PT LONG TERM GOAL #4   Title Perform ADL's with left shoulder pain not > 3/10.    Time 6    Period Weeks    Status On-going              Plan - 03/27/22 1111     Clinical Impression Statement Patient presented in clinic with reports of mild L shoulder pain. 05-14-22 to MD for shldr. Rx focused on ROM as well as mm activation, control, and strengthening. Pt still challenged with elevating LT UE anti-gravity and still needs assistance. Proximal part of incision still healing.   Personal Factors and Comorbidities Comorbidity 1;Comorbidity 2;Other    Comorbidities Hypothyroidism, back surgery, bilateral TKA's, left THA, bilateral shoulder surgeries, HTN, OA.    Examination-Activity Limitations Other;Reach Overhead    Examination-Participation Restrictions Other    Stability/Clinical Decision Making Stable/Uncomplicated    Rehab Potential Good    PT Frequency 2x / week    PT Duration 6 weeks    PT Treatment/Interventions ADLs/Self Care Home Management;Cryotherapy;Electrical Stimulation;Moist Heat;Iontophoresis 4mg /ml Dexamethasone;Therapeutic activities;Therapeutic exercise;Patient/family education;Manual techniques;Passive range of motion;Dry needling;Vasopneumatic Device    PT Next Visit Plan Progress per reverse total shoulder protocol.  Begin with gentle left shoulder P/AROM, seated UE Ranger progressing to standing, pulleys.    Consulted and Agree with Plan of Care Patient  Chris  Janifer Gieselman, PTA 03/27/22 1:20 PM  03/27/22 11:34 AM

## 2022-03-28 ENCOUNTER — Ambulatory Visit: Payer: Medicare Other | Admitting: Orthopaedic Surgery

## 2022-03-29 ENCOUNTER — Encounter: Payer: Self-pay | Admitting: *Deleted

## 2022-03-29 ENCOUNTER — Ambulatory Visit: Payer: Medicare Other | Admitting: *Deleted

## 2022-03-29 DIAGNOSIS — G8929 Other chronic pain: Secondary | ICD-10-CM

## 2022-03-29 DIAGNOSIS — M25612 Stiffness of left shoulder, not elsewhere classified: Secondary | ICD-10-CM

## 2022-03-29 DIAGNOSIS — M25512 Pain in left shoulder: Secondary | ICD-10-CM | POA: Diagnosis not present

## 2022-03-29 NOTE — Therapy (Signed)
OUTPATIENT PHYSICAL THERAPY TREATMENT NOTE   Patient Name: Marilyn [name] MRN: [id number] DOB:10-Apr-1971, 51 y.o., female Today's Date: 03/29/2022   REFERRING PROVIDER: Cammy Copa, MD   PT End of Session - 03/29/22 1108     Visit Number 8   Number of Visits 12    Date for PT Re-Evaluation 05/21/22    Authorization Type FOTO AT LEAST EVERY 5TH VISIT.  PROGRESS NOTE AT 10TH VISIT.  KX MODIFIER AFTER 15 VISITS.    PT Start Time 1314   PT Stop Time 1359   PT Time Calculation (min)   Activity Tolerance Patient tolerated treatment well    Behavior During Therapy WFL for tasks assessed/performed             Past Medical History:  Diagnosis Date   Anemia    taking iron now. pt states having no current issues 09/02/2015   Anginal pain (HCC)    pt states experiences chest wall pain pt states related to her asthma    Anxiety    with MRI's   Arthritis    Everywhere   Asthma    Depression    Dizziness    GERD (gastroesophageal reflux disease)    Headache(784.0)    HX OF MIGRAINES   History of bronchitis    Hypertension    Hypothyroidism    takes levothyroxen   OSA on CPAP    wears cpap   Shortness of breath    with exertion   Wears glasses    Past Surgical History:  Procedure Laterality Date   APPLICATION OF WOUND VAC  11/16/2021   Procedure: APPLICATION OF WOUND VAC;  Surgeon: Cammy Copa, MD;  Location: [facility] OR;  Service: Orthopedics;;   APPLICATION OF WOUND VAC Left 12/01/2021   Procedure: APPLICATION OF WOUND VAC;  Surgeon: Cammy Copa, MD;  Location: [facility] OR;  Service: Orthopedics;  Laterality: Left;   BACK SURGERY     CESAREAN SECTION     times 2   CHOLECYSTECTOMY     COLONOSCOPY  2020   ENDOMETRIAL ABLATION     I & D EXTREMITY Left 12/06/2021   Procedure: LEFT SHOULDER DEBRIDEMENT AND WOUND CLOSURE;  Surgeon: [name], MD;  Location: [facility] OR;  Service: Orthopedics;  Laterality: Left;   I & D EXTREMITY Left 12/20/2021    Procedure: LEFT SHOULDER DEBRIDEMENT;  Surgeon: [name], MD;  Location: [facility] OR;  Service: Orthopedics;  Laterality: Left;   I & D EXTREMITY Left 12/22/2021   Procedure: LEFT SHOULDER DEBRIDEMENT;  Surgeon: [name], MD;  Location: [facility] OR;  Service: Orthopedics;  Laterality: Left;   INCISION AND DRAINAGE Left 12/01/2021   Procedure: LEFT SHOULDER IRRIGATION AND EXCISIONAL DEBRIDEMENT;  Surgeon: Cammy Copa, MD;  Location: [facility] OR;  Service: Orthopedics;  Laterality: Left;   INCISION AND DRAINAGE HIP Left 11/10/2015   Procedure: IRRIGATION AND DEBRIDEMENT LEFT HIP INCISION;  Surgeon: Kathryne Hitch, MD;  Location: [facility] OR;  Service: Orthopedics;  Laterality: Left;   JOINT REPLACEMENT  2011   total left knee   KNEE ARTHROPLASTY  04/23/2012   right    KNEE ARTHROSCOPY     LUMBAR LAMINECTOMY/DECOMPRESSION MICRODISCECTOMY N/A 06/23/2019   Procedure: RIGHT LUMBAR FIVE THROUGH SACRAL ONE PARTIAL HEMILAMINECTOMY WITH RIGHT LUMBAR FIVE FORAMINOTOMY;  Surgeon: Kerrin Champagne, MD;  Location: [facility] OR;  Service: Orthopedics;  Laterality: N/A;   LUMBAR WOUND DEBRIDEMENT N/A 06/11/2018   Procedure: LUMBAR WOUND DEBRIDEMENT;  Surgeon: Kerrin Champagne, MD;  Location: [facility] OR;  Service: Orthopedics;  Laterality: N/A;   RADIOLOGY WITH ANESTHESIA N/A 09/09/2018   Procedure: LUMBER SPINE WITHOUT CONTRAST;  Surgeon: Radiologist, Medication, MD;  Location: [facility] OR;  Service: Radiology;  Laterality: N/A;   REVERSE SHOULDER ARTHROPLASTY Left 11/16/2021   Procedure: LEFT REVERSE SHOULDER ARTHROPLASTY;  Surgeon: Cammy Copa, MD;  Location: [facility] OR;  Service: Orthopedics;  Laterality: Left;   ROTATOR CUFF REPAIR     left    SHOULDER SURGERY     right to repair ligament tear   TOTAL HIP ARTHROPLASTY Left 09/09/2015   Procedure: LEFT TOTAL HIP ARTHROPLASTY ANTERIOR APPROACH;  Surgeon: Kathryne Hitch, MD;  Location: [facility];  Service: Orthopedics;  Laterality: Left;   TOTAL KNEE ARTHROPLASTY   04/23/2012   Procedure: TOTAL KNEE ARTHROPLASTY;  Surgeon: [name], MD;  Location: [facility] OR;  Service: Orthopedics;  Laterality: Right;  Right Total Knee Arthroplasty   TUBAL LIGATION  1996   Patient Active Problem List   Diagnosis Date Noted   Lactic acidosis 12/18/2021   Dyspnea on exertion 12/18/2021   Anemia 12/18/2021   Gout 12/18/2021   Acute pain of left shoulder    Arthritis of left shoulder region    S/P reverse total shoulder arthroplasty, left 11/16/2021   Unilateral primary osteoarthritis, right hip 05/09/2021   S/P lumbar spinal fusion 05/09/2021   S/P insertion of spinal cord stimulator 05/09/2021   H/O total knee replacement, bilateral 01/18/2021   Lumbar post-laminectomy syndrome 04/06/2020   Long-term current use of opiate analgesic 11/04/2019   Leg pain, right 10/20/2019   Dysesthesia 10/20/2019   Lateral femoral cutaneous neuropathy, 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 difficile colitis 05/22/2019   Hypophosphatemia 05/21/2019   Syncope 05/20/2019   OSA on CPAP    Acute gastroenteritis    Anemia due to blood loss, acute 06/18/2018    Class: Acute   Plantar fasciitis of left foot 06/16/2018    Class: Chronic   Tendonitis, Achilles, right 06/16/2018    Class: Chronic   Osteochondral talar dome lesion 06/16/2018    Class: Chronic   Deep incisional surgical site infection    Superficial incisional surgical site infection 06/11/2018    Class: Acute   Right ankle effusion 06/11/2018    Class: Acute   Morbid (severe) obesity due to excess calories (HCC) 05/29/2018    Class: Chronic   Spondylolisthesis, lumbar region 05/27/2018    Class: Chronic   Spinal stenosis of lumbar region 05/27/2018    Class: Chronic   Urinary tract infection 05/27/2018    Class: Acute   Fusion of spine of lumbar region 05/27/2018   Pain of right hip joint 07/03/2017   Acute right-sided low  back pain with right-sided sciatica 03/27/2017   Chronic right shoulder pain 02/13/2017   History of rotator cuff tear 11/30/2016   Impingement syndrome of right shoulder 11/30/2016   Exacerbation of asthma 07/18/2016   Hypokalemia 07/18/2016   Hypertension 07/18/2016   Depression with anxiety 07/18/2016   Hypothyroidism 07/18/2016   Hyperglycemia 07/18/2016   Asthma, chronic 07/18/2016   Surgical wound dehiscence left hip; questionable superficial infection 11/10/2015   Postoperative wound infection 11/10/2015   Osteoarthritis of left hip 09/09/2015   Status post total replacement of left hip 09/09/2015   Obesity (BMI 35.0-39.9 without comorbidity) 04/28/2013    REFERRING DIAG: chronic left shoulder  pain, stiffness of left shoulder, not elsewhere classified  THERAPY DIAG:  Chronic left shoulder pain  Stiffness of left shoulder, not elsewhere classified  Rationale for Evaluation and Treatment Rehabilitation  PERTINENT HISTORY: Hypothyroidism, back surgery, bilateral TKA's, left THA, bilateral shoulder surgeries, HTN, OA.   PRECAUTIONS: Progress per reverse shoulder replacement protocol. No ultrasound. No lifting over 20#s  SUBJECTIVE: Reports her LT shldr is sore todayIncision still healing  PAIN:  Are you having pain? Yes: NPRS scale: 4-5/10 Pain location: L shoulder Pain description: constant sore Aggravating factors: none Relieving factors: rest     TODAY'S TREATMENT:                                     EXERCISE LOG    03-29-22  Exercise Repetitions and Resistance Comments  Pulley X5 min   UE ranger LT Seated; flex x4 min, CW/ CCW circles x3 min   Tables slide ball roll (on incline for flexion) standing    L bicep curl 2# 3x10 reps   L upper cut AROM x10 reps, AAROM x10   B shoulder protraction     Blank cell = exercise not performed today  *MANUAL : AAROM for elevation 1x10 LT UE.   STW to LT anterior deltoid as able, but avoiding healing  incision. Modalities  Unattended Estim: Shoulder, IFC 80-150 hz, 10 mins, Pain        PT Long Term Goals - 03/16/22 1108       PT LONG TERM GOAL #1   Title Patient will be independent with HEP    Time 6    Period Weeks    Status On-going      PT LONG TERM GOAL #2   Title Left active shoulder flexion to 125 degrees so the patient can easily reach overhead.    Time 6    Period Weeks    Status On-going      PT LONG TERM GOAL #3   Title Left active shoulder ER to 60 degrees+ to allow for easily donning/doffing of apparel.    Time 6    Period Weeks    Status On-going      PT LONG TERM GOAL #4   Title Perform ADL's with left shoulder pain not > 3/10.    Time 6    Period Weeks    Status On-going              Plan - 03/29/22 1111     Clinical Impression Statement Patient presented in clinic with reports of mild L shoulder pain. 05-14-22 to MD for shldr. Rx focused on AAROM as well as mm activation, control, and strengthening. Gentle STW performed to anterior deltoid  avoiding incision   Personal Factors and Comorbidities Comorbidity 1;Comorbidity 2;Other    Comorbidities Hypothyroidism, back surgery, bilateral TKA's, left THA, bilateral shoulder surgeries, HTN, OA.    Examination-Activity Limitations Other;Reach Overhead    Examination-Participation Restrictions Other    Stability/Clinical Decision Making Stable/Uncomplicated    Rehab Potential Good    PT Frequency 2x / week    PT Duration 6 weeks    PT Treatment/Interventions ADLs/Self Care Home Management;Cryotherapy;Electrical Stimulation;Moist Heat;Iontophoresis 4mg /ml Dexamethasone;Therapeutic activities;Therapeutic exercise;Patient/family education;Manual techniques;Passive range of motion;Dry needling;Vasopneumatic Device    PT Next Visit Plan Progress per reverse total shoulder protocol.  Begin with gentle left shoulder P/AROM, seated UE Ranger progressing to standing, pulleys.    Consulted and  Agree with Plan of  Care Patient             Gretta Cool, PTA 03/29/22 1:19 PM  03/29/22 1:19 PM

## 2022-04-03 ENCOUNTER — Encounter: Payer: Medicare Other | Admitting: Physical Therapy

## 2022-04-05 ENCOUNTER — Encounter: Payer: Self-pay | Admitting: Physical Therapy

## 2022-04-05 ENCOUNTER — Ambulatory Visit: Payer: Medicare Other | Admitting: Physical Therapy

## 2022-04-05 DIAGNOSIS — M25512 Pain in left shoulder: Secondary | ICD-10-CM | POA: Diagnosis not present

## 2022-04-05 DIAGNOSIS — G8929 Other chronic pain: Secondary | ICD-10-CM

## 2022-04-05 DIAGNOSIS — M25612 Stiffness of left shoulder, not elsewhere classified: Secondary | ICD-10-CM

## 2022-04-05 NOTE — Therapy (Signed)
OUTPATIENT PHYSICAL THERAPY TREATMENT NOTE   Patient Name: Marilyn [name] MRN: [id number] DOB:[birth year], 51 y.o., female Today's Date: 04/05/2022   REFERRING PROVIDER: Meredith Pel, MD   PT End of Session - 03/29/22 1108     Visit Number 9   Number of Visits 12    Date for PT Re-Evaluation 05/21/22    Authorization Type FOTO AT LEAST EVERY 5TH VISIT.  PROGRESS NOTE AT 10TH VISIT.  KX MODIFIER AFTER 15 VISITS.    PT Start Time 1124   PT Stop Time 1205   PT Time Calculation (min) 41 min   Activity Tolerance Patient tolerated treatment well    Behavior During Therapy WFL for tasks assessed/performed             Past Medical History:  Diagnosis Date   Anemia    taking iron now. pt states having no current issues 09/02/2015   Anginal pain (HCC)    pt states experiences chest wall pain pt states related to her asthma    Anxiety    with MRI's   Arthritis    Everywhere   Asthma    Depression    Dizziness    GERD (gastroesophageal reflux disease)    Headache(784.0)    HX OF MIGRAINES   History of bronchitis    Hypertension    Hypothyroidism    takes levothyroxen   OSA on CPAP    wears cpap   Shortness of breath    with exertion   Wears glasses    Past Surgical History:  Procedure Laterality Date   APPLICATION OF WOUND VAC  11/16/2021   Procedure: APPLICATION OF WOUND VAC;  Surgeon: Meredith Pel, MD;  Location: [place];  Service: Orthopedics;;   APPLICATION OF WOUND VAC Left 12/01/2021   Procedure: APPLICATION OF WOUND VAC;  Surgeon: Meredith Pel, MD;  Location: [place];  Service: Orthopedics;  Laterality: Left;   BACK SURGERY     CESAREAN SECTION     times 2   CHOLECYSTECTOMY     COLONOSCOPY  2020   ENDOMETRIAL ABLATION     I & D EXTREMITY Left 12/06/2021   Procedure: LEFT SHOULDER DEBRIDEMENT AND WOUND CLOSURE;  Surgeon: Newt Minion, MD;  Location: [place];  Service: Orthopedics;  Laterality: Left;   I & D EXTREMITY Left 12/20/2021    Procedure: LEFT SHOULDER DEBRIDEMENT;  Surgeon: Newt Minion, MD;  Location: [place];  Service: Orthopedics;  Laterality: Left;   I & D EXTREMITY Left 12/22/2021   Procedure: LEFT SHOULDER DEBRIDEMENT;  Surgeon: Newt Minion, MD;  Location: [place];  Service: Orthopedics;  Laterality: Left;   INCISION AND DRAINAGE Left 12/01/2021   Procedure: LEFT SHOULDER IRRIGATION AND EXCISIONAL DEBRIDEMENT;  Surgeon: Meredith Pel, MD;  Location: [place];  Service: Orthopedics;  Laterality: Left;   INCISION AND DRAINAGE HIP Left 11/10/2015   Procedure: IRRIGATION AND DEBRIDEMENT LEFT HIP INCISION;  Surgeon: Mcarthur Rossetti, MD;  Location: [place];  Service: Orthopedics;  Laterality: Left;   JOINT REPLACEMENT  2011   total left knee   KNEE ARTHROPLASTY  04/23/2012   right    KNEE ARTHROSCOPY     LUMBAR LAMINECTOMY/DECOMPRESSION MICRODISCECTOMY N/A 06/23/2019   Procedure: RIGHT LUMBAR FIVE THROUGH SACRAL ONE PARTIAL HEMILAMINECTOMY WITH RIGHT LUMBAR FIVE FORAMINOTOMY;  Surgeon: Jessy Oto, MD;  Location: [place];  Service: Orthopedics;  Laterality: N/A;   LUMBAR WOUND DEBRIDEMENT N/A 06/11/2018   Procedure: LUMBAR WOUND DEBRIDEMENT;  Surgeon: Kerrin Champagne, MD;  Location: [facility];  Service: Orthopedics;  Laterality: N/A;   RADIOLOGY WITH ANESTHESIA N/A 09/09/2018   Procedure: LUMBER SPINE WITHOUT CONTRAST;  Surgeon: Radiologist, Medication, MD;  Location: [facility];  Service: Radiology;  Laterality: N/A;   REVERSE SHOULDER ARTHROPLASTY Left 11/16/2021   Procedure: LEFT REVERSE SHOULDER ARTHROPLASTY;  Surgeon: Cammy Copa, MD;  Location: [facility] OR;  Service: Orthopedics;  Laterality: Left;   ROTATOR CUFF REPAIR     left    SHOULDER SURGERY     right to repair ligament tear   TOTAL HIP ARTHROPLASTY Left 09/09/2015   Procedure: LEFT TOTAL HIP ARTHROPLASTY ANTERIOR APPROACH;  Surgeon: Kathryne Hitch, MD;  Location: [facility];  Service: Orthopedics;  Laterality: Left;   TOTAL KNEE ARTHROPLASTY   04/23/2012   Procedure: TOTAL KNEE ARTHROPLASTY;  Surgeon: [name], MD;  Location: [facility];  Service: Orthopedics;  Laterality: Right;  Right Total Knee Arthroplasty   TUBAL LIGATION  1996   Patient Active Problem List   Diagnosis Date Noted   Lactic acidosis 12/18/2021   Dyspnea on exertion 12/18/2021   Anemia 12/18/2021   Gout 12/18/2021   Acute pain of left shoulder    Arthritis of left shoulder region    S/P reverse total shoulder arthroplasty, left 11/16/2021   Unilateral primary osteoarthritis, right hip 05/09/2021   S/P lumbar spinal fusion 05/09/2021   S/P insertion of spinal cord stimulator 05/09/2021   H/O total knee replacement, bilateral 01/18/2021   Lumbar post-laminectomy syndrome 04/06/2020   Long-term current use of opiate analgesic 11/04/2019   Leg pain, right 10/20/2019   Dysesthesia 10/20/2019   Lateral femoral cutaneous neuropathy, 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 difficile colitis 05/22/2019   Hypophosphatemia 05/21/2019   Syncope 05/20/2019   OSA on CPAP    Acute gastroenteritis    Anemia due to blood loss, acute 06/18/2018    Class: Acute   Plantar fasciitis of left foot 06/16/2018    Class: Chronic   Tendonitis, Achilles, right 06/16/2018    Class: Chronic   Osteochondral talar dome lesion 06/16/2018    Class: Chronic   Deep incisional surgical site infection    Superficial incisional surgical site infection 06/11/2018    Class: Acute   Right ankle effusion 06/11/2018    Class: Acute   Morbid (severe) obesity due to excess calories (HCC) 05/29/2018    Class: Chronic   Spondylolisthesis, lumbar region 05/27/2018    Class: Chronic   Spinal stenosis of lumbar region 05/27/2018    Class: Chronic   Urinary tract infection 05/27/2018    Class: Acute   Fusion of spine of lumbar region 05/27/2018   Pain of right hip joint 07/03/2017   Acute right-sided low  back pain with right-sided sciatica 03/27/2017   Chronic right shoulder pain 02/13/2017   History of rotator cuff tear 11/30/2016   Impingement syndrome of right shoulder 11/30/2016   Exacerbation of asthma 07/18/2016   Hypokalemia 07/18/2016   Hypertension 07/18/2016   Depression with anxiety 07/18/2016   Hypothyroidism 07/18/2016   Hyperglycemia 07/18/2016   Asthma, chronic 07/18/2016   Surgical wound dehiscence left hip; questionable superficial infection 11/10/2015   Postoperative wound infection 11/10/2015   Osteoarthritis of left hip 09/09/2015   Status post total replacement of left hip 09/09/2015   Obesity (BMI 35.0-39.9 without comorbidity) 04/28/2013    REFERRING DIAG: chronic left shoulder  pain, stiffness of left shoulder, not elsewhere classified  THERAPY DIAG:  Chronic left shoulder pain  Stiffness of left shoulder, not elsewhere classified  Rationale for Evaluation and Treatment Rehabilitation  PERTINENT HISTORY: Hypothyroidism, back surgery, bilateral TKA's, left THA, bilateral shoulder surgeries, HTN, OA.   PRECAUTIONS: Progress per reverse shoulder replacement protocol. No ultrasound. No lifting over 20#s  SUBJECTIVE: Reports shoulder pain today. Neosporin on incision upon arrival.  PAIN:  Are you having pain? Yes: NPRS scale: 5-6/10 Pain location: L shoulder Pain description: constant sore Aggravating factors: none Relieving factors: rest     TODAY'S TREATMENT:                                     EXERCISE LOG    04-05-22  Exercise Repetitions and Resistance Comments  Pulley X5 min   UE ranger LT Seated; flex x4 min, CW/ CCW circles x3 min   L bicep curl 2# 3x10 reps   L upper cut AROM x15 reps    Blank cell = exercise not performed today   Modalities  Unattended Estim: Shoulder, IFC 80-150 hz, 10 mins, Pain   PT Long Term Goals - 04/05/22 1133      PT LONG TERM GOAL #1   Title Patient will be independent with HEP    Time 6    Period  Weeks    Status On-going      PT LONG TERM GOAL #2   Title Left active shoulder flexion to 125 degrees so the patient can easily reach overhead.    Time 6    Period Weeks    Status On-going      PT LONG TERM GOAL #3   Title Left active shoulder ER to 60 degrees+ to allow for easily donning/doffing of apparel.    Time 6    Period Weeks    Status On-going      PT LONG TERM GOAL #4   Title Perform ADL's with left shoulder pain not > 3/10.    Time 6    Period Weeks    Status On-going              Plan - 04/05/22 1133    Clinical Impression Statement Patient presented in clinic with increased L shoulder pain and neosporin donned to incision. Patient applying neosporin per MD instruction. Patient limited mildly with L shoulder exercises due to pain. Normal stimulation response noted following removal of the modality.   Personal Factors and Comorbidities Comorbidity 1;Comorbidity 2;Other    Comorbidities Hypothyroidism, back surgery, bilateral TKA's, left THA, bilateral shoulder surgeries, HTN, OA.    Examination-Activity Limitations Other;Reach Overhead    Examination-Participation Restrictions Other    Stability/Clinical Decision Making Stable/Uncomplicated    Rehab Potential Good    PT Frequency 2x / week    PT Duration 6 weeks    PT Treatment/Interventions ADLs/Self Care Home Management;Cryotherapy;Electrical Stimulation;Moist Heat;Iontophoresis 4mg /ml Dexamethasone;Therapeutic activities;Therapeutic exercise;Patient/family education;Manual techniques;Passive range of motion;Dry needling;Vasopneumatic Device    PT Next Visit Plan Progress per reverse total shoulder protocol.  Begin with gentle left shoulder P/AROM, seated UE Ranger progressing to standing, pulleys.    Consulted and Agree with Plan of Care Patient             , PTA 04/05/22 12:24 PM

## 2022-04-10 ENCOUNTER — Telehealth: Payer: Self-pay | Admitting: Orthopedic Surgery

## 2022-04-10 ENCOUNTER — Encounter: Payer: Self-pay | Admitting: *Deleted

## 2022-04-10 ENCOUNTER — Ambulatory Visit: Payer: Medicare Other | Admitting: *Deleted

## 2022-04-10 DIAGNOSIS — G8929 Other chronic pain: Secondary | ICD-10-CM

## 2022-04-10 DIAGNOSIS — M25612 Stiffness of left shoulder, not elsewhere classified: Secondary | ICD-10-CM

## 2022-04-10 DIAGNOSIS — M25512 Pain in left shoulder: Secondary | ICD-10-CM | POA: Diagnosis not present

## 2022-04-10 NOTE — Telephone Encounter (Signed)
Patient called back and was scheduled.

## 2022-04-10 NOTE — Telephone Encounter (Signed)
IC LMVM for patient to call us back so we can work her in to see Dr August Saucer tomorrow, Wednesday the 26th

## 2022-04-10 NOTE — Telephone Encounter (Signed)
Pt asked to Dr. August Saucer or PA Franky Macho to call [facility] where her referral was sent for pt. Pt states they are concerned about shoulder not healing correctly. Pt states physical therapy is concerned about left shoulder. Pt phone number is [phone].

## 2022-04-10 NOTE — Therapy (Signed)
OUTPATIENT PHYSICAL THERAPY TREATMENT NOTE   Patient Name: Marilyn [name] MRN: [id number] DOB:20-Jan-1971, 51 y.o., female Today's Date: 04/10/2022   REFERRING PROVIDER: Cammy Copa, MD   PT End of Session - 03/29/22 1108     Visit Number 9   Number of Visits 12    Date for PT Re-Evaluation 05/21/22    Authorization Type FOTO AT LEAST EVERY 5TH VISIT.  PROGRESS NOTE AT 10TH VISIT.  KX MODIFIER AFTER 15 VISITS.    PT Start Time 1123   PT Stop Time 1210   PT Time Calculation (min) 47 min   Activity Tolerance Patient tolerated treatment well    Behavior During Therapy WFL for tasks assessed/performed             Past Medical History:  Diagnosis Date   Anemia    taking iron now. pt states having no current issues 09/02/2015   Anginal pain (HCC)    pt states experiences chest wall pain pt states related to her asthma    Anxiety    with MRI's   Arthritis    Everywhere   Asthma    Depression    Dizziness    GERD (gastroesophageal reflux disease)    Headache(784.0)    HX OF MIGRAINES   History of bronchitis    Hypertension    Hypothyroidism    takes levothyroxen   OSA on CPAP    wears cpap   Shortness of breath    with exertion   Wears glasses    Past Surgical History:  Procedure Laterality Date   APPLICATION OF WOUND VAC  11/16/2021   Procedure: APPLICATION OF WOUND VAC;  Surgeon: Cammy Copa, MD;  Location: [facility] OR;  Service: Orthopedics;;   APPLICATION OF WOUND VAC Left 12/01/2021   Procedure: APPLICATION OF WOUND VAC;  Surgeon: Cammy Copa, MD;  Location: [facility];  Service: Orthopedics;  Laterality: Left;   BACK SURGERY     CESAREAN SECTION     times 2   CHOLECYSTECTOMY     COLONOSCOPY  2020   ENDOMETRIAL ABLATION     I & D EXTREMITY Left 12/06/2021   Procedure: LEFT SHOULDER DEBRIDEMENT AND WOUND CLOSURE;  Surgeon: [name], MD;  Location[facility];  Service: Orthopedics;  Laterality: Left;   I & D EXTREMITY Left 12/20/2021    Procedure: LEFT SHOULDER DEBRIDEMENT;  Surgeon: [name], MD;  Location: [facility] OR;  Service: Orthopedics;  Laterality: Left;   I & D EXTREMITY Left 12/22/2021   Procedure: LEFT SHOULDER DEBRIDEMENT;  Surgeon: [name], MD;  Location: [facility] OR;  Service: Orthopedics;  Laterality: Left;   INCISION AND DRAINAGE Left 12/01/2021   Procedure: LEFT SHOULDER IRRIGATION AND EXCISIONAL DEBRIDEMENT;  Surgeon: Cammy Copa, MD;  Location: [facility] OR;  Service: Orthopedics;  Laterality: Left;   INCISION AND DRAINAGE HIP Left 11/10/2015   Procedure: IRRIGATION AND DEBRIDEMENT LEFT HIP INCISION;  Surgeon: Kathryne Hitch, MD;  Location: [facility];  Service: Orthopedics;  Laterality: Left;   JOINT REPLACEMENT  2011   total left knee   KNEE ARTHROPLASTY  04/23/2012   right    KNEE ARTHROSCOPY     LUMBAR LAMINECTOMY/DECOMPRESSION MICRODISCECTOMY N/A 06/23/2019   Procedure: RIGHT LUMBAR FIVE THROUGH SACRAL ONE PARTIAL HEMILAMINECTOMY WITH RIGHT LUMBAR FIVE FORAMINOTOMY;  Surgeon: Kerrin Champagne, MD;  Location: [facility];  Service: Orthopedics;  Laterality: N/A;   LUMBAR WOUND DEBRIDEMENT N/A 06/11/2018   Procedure: LUMBAR WOUND DEBRIDEMENT;  Surgeon: Kerrin Champagne, MD;  Location: [facility] OR;  Service: Orthopedics;  Laterality: N/A;   RADIOLOGY WITH ANESTHESIA N/A 09/09/2018   Procedure: LUMBER SPINE WITHOUT CONTRAST;  Surgeon: Radiologist, Medication, MD;  Location: [facility];  Service: Radiology;  Laterality: N/A;   REVERSE SHOULDER ARTHROPLASTY Left 11/16/2021   Procedure: LEFT REVERSE SHOULDER ARTHROPLASTY;  Surgeon: Cammy Copa, MD;  Location: [facility] OR;  Service: Orthopedics;  Laterality: Left;   ROTATOR CUFF REPAIR     left    SHOULDER SURGERY     right to repair ligament tear   TOTAL HIP ARTHROPLASTY Left 09/09/2015   Procedure: LEFT TOTAL HIP ARTHROPLASTY ANTERIOR APPROACH;  Surgeon: Kathryne Hitch, MD;  Location: [facility];  Service: Orthopedics;  Laterality: Left;   TOTAL KNEE ARTHROPLASTY   04/23/2012   Procedure: TOTAL KNEE ARTHROPLASTY;  Surgeon: [name], MD;  Location: [facility];  Service: Orthopedics;  Laterality: Right;  Right Total Knee Arthroplasty   TUBAL LIGATION  1996   Patient Active Problem List   Diagnosis Date Noted   Lactic acidosis 12/18/2021   Dyspnea on exertion 12/18/2021   Anemia 12/18/2021   Gout 12/18/2021   Acute pain of left shoulder    Arthritis of left shoulder region    S/P reverse total shoulder arthroplasty, left 11/16/2021   Unilateral primary osteoarthritis, right hip 05/09/2021   S/P lumbar spinal fusion 05/09/2021   S/P insertion of spinal cord stimulator 05/09/2021   H/O total knee replacement, bilateral 01/18/2021   Lumbar post-laminectomy syndrome 04/06/2020   Long-term current use of opiate analgesic 11/04/2019   Leg pain, right 10/20/2019   Dysesthesia 10/20/2019   Lateral femoral cutaneous neuropathy, 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 difficile colitis 05/22/2019   Hypophosphatemia 05/21/2019   Syncope 05/20/2019   OSA on CPAP    Acute gastroenteritis    Anemia due to blood loss, acute 06/18/2018    Class: Acute   Plantar fasciitis of left foot 06/16/2018    Class: Chronic   Tendonitis, Achilles, right 06/16/2018    Class: Chronic   Osteochondral talar dome lesion 06/16/2018    Class: Chronic   Deep incisional surgical site infection    Superficial incisional surgical site infection 06/11/2018    Class: Acute   Right ankle effusion 06/11/2018    Class: Acute   Morbid (severe) obesity due to excess calories (HCC) 05/29/2018    Class: Chronic   Spondylolisthesis, lumbar region 05/27/2018    Class: Chronic   Spinal stenosis of lumbar region 05/27/2018    Class: Chronic   Urinary tract infection 05/27/2018    Class: Acute   Fusion of spine of lumbar region 05/27/2018   Pain of right hip joint 07/03/2017   Acute right-sided low  back pain with right-sided sciatica 03/27/2017   Chronic right shoulder pain 02/13/2017   History of rotator cuff tear 11/30/2016   Impingement syndrome of right shoulder 11/30/2016   Exacerbation of asthma 07/18/2016   Hypokalemia 07/18/2016   Hypertension 07/18/2016   Depression with anxiety 07/18/2016   Hypothyroidism 07/18/2016   Hyperglycemia 07/18/2016   Asthma, chronic 07/18/2016   Surgical wound dehiscence left hip; questionable superficial infection 11/10/2015   Postoperative wound infection 11/10/2015   Osteoarthritis of left hip 09/09/2015   Status post total replacement of left hip 09/09/2015   Obesity (BMI 35.0-39.9 without comorbidity) 04/28/2013    REFERRING DIAG: chronic left shoulder  pain, stiffness of left shoulder, not elsewhere classified  THERAPY DIAG:  Chronic left shoulder pain  Stiffness of left shoulder, not elsewhere classified  Rationale for Evaluation and Treatment Rehabilitation  PERTINENT HISTORY: Hypothyroidism, back surgery, bilateral TKA's, left THA, bilateral shoulder surgeries, HTN, OA.   PRECAUTIONS: Progress per reverse shoulder replacement protocol. No ultrasound. No lifting over 20#s  SUBJECTIVE: Reports shoulder pain today is about the same Neosporin on incision upon arrival. Called MD to call PT clinic  PAIN:  Are you having pain? Yes: NPRS scale: 5-6/10 Pain location: L shoulder Pain description: constant sore Aggravating factors: none Relieving factors: rest     TODAY'S TREATMENT:     Surgery date 11-16-21                               EXERCISE LOG    04-05-22  Exercise Repetitions and Resistance Comments  Pulley X5 min   UE ranger LT Seated; flex x4 min, CW/ CCW circles x3 min   L bicep curl    L upper cut AROM x15 reps    Blank cell = exercise not performed today    Manual: AAROM for elevation in sitting 3x10 reaching up, 3x10 flexion elbow straight Open Incision: 5cm long,1cm wide  04-05-22  Modalities  Unattended  Estim: Shoulder, IFC 80-150 hz, 10 mins, Pain   PT Long Term Goals - 04/05/22 1133      PT LONG TERM GOAL #1   Title Patient will be independent with HEP    Time 6    Period Weeks    Status On-going      PT LONG TERM GOAL #2   Title Left active shoulder flexion to 125 degrees so the patient can easily reach overhead.    Time 6    Period Weeks    Status On-going      PT LONG TERM GOAL #3   Title Left active shoulder ER to 60 degrees+ to allow for easily donning/doffing of apparel.    Time 6    Period Weeks    Status On-going      PT LONG TERM GOAL #4   Title Perform ADL's with left shoulder pain not > 3/10.    Time 6    Period Weeks    Status On-going              Plan - 04/10/22 1133    Clinical Impression Statement Patient presented in clinic with increased L shoulder pain and neosporin donned to incision. Rx focused on Elevation and reaching up with LT UE with AROM as well as AAROM exs and manual assist. Active elevation to 85 degrees and PROM to 130 degrees.   Personal Factors and Comorbidities Comorbidity 1;Comorbidity 2;Other    Comorbidities Hypothyroidism, back surgery, bilateral TKA's, left THA, bilateral shoulder surgeries, HTN, OA.    Examination-Activity Limitations Other;Reach Overhead    Examination-Participation Restrictions Other    Stability/Clinical Decision Making Stable/Uncomplicated    Rehab Potential Good    PT Frequency 2x / week    PT Duration 6 weeks    PT Treatment/Interventions ADLs/Self Care Home Management;Cryotherapy;Electrical Stimulation;Moist Heat;Iontophoresis 4mg /ml Dexamethasone;Therapeutic activities;Therapeutic exercise;Patient/family education;Manual techniques;Passive range of motion;Dry needling;Vasopneumatic Device    PT Next Visit Plan Progress per reverse total shoulder protocol.  Begin with gentle left shoulder P/AROM, seated UE Ranger progressing to standing, pulleys.    Consulted and Agree with Plan of Care Patient  Chris Jonny Dearden, PTA 04/10/22 1:49 PM

## 2022-04-11 ENCOUNTER — Encounter: Payer: Medicare Other | Admitting: Physical Medicine and Rehabilitation

## 2022-04-11 ENCOUNTER — Ambulatory Visit (INDEPENDENT_AMBULATORY_CARE_PROVIDER_SITE_OTHER): Payer: Medicare Other | Admitting: Orthopedic Surgery

## 2022-04-11 DIAGNOSIS — M1611 Unilateral primary osteoarthritis, right hip: Secondary | ICD-10-CM

## 2022-04-11 DIAGNOSIS — Z96612 Presence of left artificial shoulder joint: Secondary | ICD-10-CM

## 2022-04-13 ENCOUNTER — Encounter: Payer: Medicare Other | Admitting: Physical Therapy

## 2022-04-14 ENCOUNTER — Encounter: Payer: Self-pay | Admitting: Orthopedic Surgery

## 2022-04-14 NOTE — Progress Notes (Signed)
Post-Op Visit Note   Patient: Marilyn [name]           Date of Birth: 27-Aug-1971           MRN: [id number] Visit Date: 04/11/2022 PCP: Rebecka Apley, NP   Assessment & Plan:  Chief Complaint:  Chief Complaint  Patient presents with   Left Shoulder - Wound Check   Visit Diagnoses:  1. Unilateral primary osteoarthritis, right hip   2. Status post reverse arthroplasty of left shoulder     Plan: Marilyn [name] is a patient with left reverse shoulder.  She had a difficult time getting her incision to heal.  We appreciate the help from Dr. Lajoyce Corners in that regard.  Physical therapy has been working with Marilyn [name] on range of motion.  Overall her pain has improved but her shoulder remains somewhat stiff.  Currently not on antibiotics.  No fevers or chills.  On examination she does have about a 4 cm x 4 mm area of superficial granulation tissue which is healing nicely from inside out.  No fluctuance erythema redness around the incision.  Shoulder itself is not warm.  I think this is healing by secondary intention and requires no intervention at this time.  6-week return for final check.  Follow-Up Instructions: No follow-ups on file.   Orders:  No orders of the defined types were placed in this encounter.  No orders of the defined types were placed in this encounter.   Imaging: No results found.  PMFS History: Patient Active Problem List   Diagnosis Date Noted   Lactic acidosis 12/18/2021   Dyspnea on exertion 12/18/2021   Anemia 12/18/2021   Gout 12/18/2021   Acute pain of left shoulder    Arthritis of left shoulder region    S/P reverse total shoulder arthroplasty, left 11/16/2021   Unilateral primary osteoarthritis, right hip 05/09/2021   S/P lumbar spinal fusion 05/09/2021   S/P insertion of spinal cord stimulator 05/09/2021   H/O total knee replacement, bilateral 01/18/2021   Lumbar post-laminectomy syndrome 04/06/2020   Long-term current use of opiate analgesic  11/04/2019   Leg pain, right 10/20/2019   Dysesthesia 10/20/2019   Lateral femoral cutaneous neuropathy, 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 difficile colitis 05/22/2019   Hypophosphatemia 05/21/2019   Syncope 05/20/2019   OSA on CPAP    Acute gastroenteritis    Anemia due to blood loss, acute 06/18/2018    Class: Acute   Plantar fasciitis of left foot 06/16/2018    Class: Chronic   Tendonitis, Achilles, right 06/16/2018    Class: Chronic   Osteochondral talar dome lesion 06/16/2018    Class: Chronic   Deep incisional surgical site infection    Superficial incisional surgical site infection 06/11/2018    Class: Acute   Right ankle effusion 06/11/2018    Class: Acute   Morbid (severe) obesity due to excess calories (HCC) 05/29/2018    Class: Chronic   Spondylolisthesis, lumbar region 05/27/2018    Class: Chronic   Spinal stenosis of lumbar region 05/27/2018    Class: Chronic   Urinary tract infection 05/27/2018    Class: Acute   Fusion of spine of lumbar region 05/27/2018   Pain of right hip joint 07/03/2017   Acute right-sided low back pain with right-sided sciatica 03/27/2017   Chronic right shoulder pain 02/13/2017   History of rotator cuff tear 11/30/2016   Impingement syndrome  of right shoulder 11/30/2016   Exacerbation of asthma 07/18/2016   Hypokalemia 07/18/2016   Hypertension 07/18/2016   Depression with anxiety 07/18/2016   Hypothyroidism 07/18/2016   Hyperglycemia 07/18/2016   Asthma, chronic 07/18/2016   Surgical wound dehiscence left hip; questionable superficial infection 11/10/2015   Postoperative wound infection 11/10/2015   Osteoarthritis of left hip 09/09/2015   Status post total replacement of left hip 09/09/2015   Obesity (BMI 35.0-39.9 without comorbidity) 04/28/2013   Past Medical History:  Diagnosis Date   Anemia    taking iron now. pt states having no  current issues 09/02/2015   Anginal pain (HCC)    pt states experiences chest wall pain pt states related to her asthma    Anxiety    with MRI's   Arthritis    Everywhere   Asthma    Depression    Dizziness    GERD (gastroesophageal reflux disease)    Headache(784.0)    HX OF MIGRAINES   History of bronchitis    Hypertension    Hypothyroidism    takes levothyroxen   OSA on CPAP    wears cpap   Shortness of breath    with exertion   Wears glasses     Family History  Problem Relation Age of Onset   Cancer Mother        colon   Epilepsy Mother    Cancer Father        prostate   Diabetes Father    Hypertension Father    Hypertension Maternal Aunt    Diabetes Maternal Aunt    Hypertension Paternal Aunt     Past Surgical History:  Procedure Laterality Date   APPLICATION OF WOUND VAC  11/16/2021   Procedure: APPLICATION OF WOUND VAC;  Surgeon: Meredith Pel, MD;  Location: [place];  Service: Orthopedics;;   APPLICATION OF WOUND VAC Left 12/01/2021   Procedure: APPLICATION OF WOUND VAC;  Surgeon: Meredith Pel, MD;  Location: [place];  Service: Orthopedics;  Laterality: Left;   BACK SURGERY     CESAREAN SECTION     times 2   CHOLECYSTECTOMY     COLONOSCOPY  2020   ENDOMETRIAL ABLATION     I & D EXTREMITY Left 12/06/2021   Procedure: LEFT SHOULDER DEBRIDEMENT AND WOUND CLOSURE;  Surgeon: Newt Minion, MD;  Location: [place];  Service: Orthopedics;  Laterality: Left;   I & D EXTREMITY Left 12/20/2021   Procedure: LEFT SHOULDER DEBRIDEMENT;  Surgeon: Newt Minion, MD;  Location: [place];  Service: Orthopedics;  Laterality: Left;   I & D EXTREMITY Left 12/22/2021   Procedure: LEFT SHOULDER DEBRIDEMENT;  Surgeon: Newt Minion, MD;  Location: [place];  Service: Orthopedics;  Laterality: Left;   INCISION AND DRAINAGE Left 12/01/2021   Procedure: LEFT SHOULDER IRRIGATION AND EXCISIONAL DEBRIDEMENT;  Surgeon: Meredith Pel, MD;  Location: [place];  Service: Orthopedics;   Laterality: Left;   INCISION AND DRAINAGE HIP Left 11/10/2015   Procedure: IRRIGATION AND DEBRIDEMENT LEFT HIP INCISION;  Surgeon: Mcarthur Rossetti, MD;  Location: [place];  Service: Orthopedics;  Laterality: Left;   JOINT REPLACEMENT  2011   total left knee   KNEE ARTHROPLASTY  04/23/2012   right    KNEE ARTHROSCOPY     LUMBAR LAMINECTOMY/DECOMPRESSION MICRODISCECTOMY N/A 06/23/2019   Procedure: RIGHT LUMBAR FIVE THROUGH SACRAL ONE PARTIAL HEMILAMINECTOMY WITH RIGHT LUMBAR FIVE FORAMINOTOMY;  Surgeon: Jessy Oto, MD;  Location: [place];  Service: Orthopedics;  Laterality: N/A;   LUMBAR WOUND DEBRIDEMENT N/A 06/11/2018   Procedure: LUMBAR WOUND DEBRIDEMENT;  Surgeon: Kerrin Champagne, MD;  Location[facility];  Service: Orthopedics;  Laterality: N/A;   RADIOLOGY WITH ANESTHESIA N/A 09/09/2018   Procedure: LUMBER SPINE WITHOUT CONTRAST;  Surgeon: Radiologist, Medication, MD;  Location: [facility];  Service: Radiology;  Laterality: N/A;   REVERSE SHOULDER ARTHROPLASTY Left 11/16/2021   Procedure: LEFT REVERSE SHOULDER ARTHROPLASTY;  Surgeon: Cammy Copa, MD;  [facility];  Service: Orthopedics;  Laterality: Left;   ROTATOR CUFF REPAIR     left    SHOULDER SURGERY     right to repair ligament tear   TOTAL HIP ARTHROPLASTY Left 09/09/2015   Procedure: LEFT TOTAL HIP ARTHROPLASTY ANTERIOR APPROACH;  Surgeon: Kathryne Hitch, MD;  Location: [facility];  Service: Orthopedics;  Laterality: Left;   TOTAL KNEE ARTHROPLASTY  04/23/2012   Procedure: TOTAL KNEE ARTHROPLASTY;  Surgeon: [name], MD;  Location: [facility];  Service: Orthopedics;  Laterality: Right;  Right Total Knee Arthroplasty   TUBAL LIGATION  1996   Social History   Occupational History   Not on file  Tobacco Use   Smoking status: Former    Packs/day: 0.50    Years: 4.00    Total pack years: 2.00    Types: Cigarettes    Quit date: 09/18/1987    Years since quitting: 34.5   Smokeless tobacco: Never  Vaping Use    Vaping Use: Never used  Substance and Sexual Activity   Alcohol use: No   Drug use: No   Sexual activity: Yes    Birth control/protection: Surgical

## 2022-04-16 ENCOUNTER — Ambulatory Visit: Payer: Medicare Other | Admitting: Orthopaedic Surgery

## 2022-04-17 ENCOUNTER — Ambulatory Visit: Payer: Medicare Other | Attending: Orthopedic Surgery | Admitting: Physical Therapy

## 2022-04-17 ENCOUNTER — Encounter: Payer: Self-pay | Admitting: Physical Therapy

## 2022-04-17 DIAGNOSIS — M25512 Pain in left shoulder: Secondary | ICD-10-CM | POA: Diagnosis present

## 2022-04-17 DIAGNOSIS — M25612 Stiffness of left shoulder, not elsewhere classified: Secondary | ICD-10-CM | POA: Insufficient documentation

## 2022-04-17 DIAGNOSIS — G8929 Other chronic pain: Secondary | ICD-10-CM | POA: Diagnosis present

## 2022-04-17 NOTE — Therapy (Signed)
OUTPATIENT PHYSICAL THERAPY TREATMENT NOTE   Patient Name: Marilyn [name] MRN: [id number] DOB:Jan 08, 1971, 51 y.o., female Today's Date: 04/17/2022   REFERRING PROVIDER: Meredith Pel, MD     PT End of Session - 04/17/22 1420     Visit Number 10    Number of Visits 12    Authorization Type FOTO AT [place].  PROGRESS NOTE AT 10TH VISIT.  KX MODIFIER AFTER 15 VISITS.    PT Start Time 0111   Late for treatment.   PT Stop Time 0144    PT Time Calculation (min) 33 min    Activity Tolerance Patient tolerated treatment well    Behavior During Therapy WFL for tasks assessed/performed              Past Medical History:  Diagnosis Date   Anemia    taking iron now. pt states having no current issues 09/02/2015   Anginal pain (HCC)    pt states experiences chest wall pain pt states related to her asthma    Anxiety    with MRI's   Arthritis    Everywhere   Asthma    Depression    Dizziness    GERD (gastroesophageal reflux disease)    Headache(784.0)    HX OF MIGRAINES   History of bronchitis    Hypertension    Hypothyroidism    takes levothyroxen   OSA on CPAP    wears cpap   Shortness of breath    with exertion   Wears glasses    Past Surgical History:  Procedure Laterality Date   APPLICATION OF WOUND VAC  11/16/2021   Procedure: APPLICATION OF WOUND VAC;  Surgeon: Meredith Pel, MD;  Location: [place];  Service: Orthopedics;;   APPLICATION OF WOUND VAC Left 12/01/2021   Procedure: APPLICATION OF WOUND VAC;  Surgeon: Meredith Pel, MD;  Location: [place];  Service: Orthopedics;  Laterality: Left;   BACK SURGERY     CESAREAN SECTION     times 2   CHOLECYSTECTOMY     COLONOSCOPY  2020   ENDOMETRIAL ABLATION     I & D EXTREMITY Left 12/06/2021   Procedure: LEFT SHOULDER DEBRIDEMENT AND WOUND CLOSURE;  Surgeon: Newt Minion, MD;  Location: [place];  Service: Orthopedics;  Laterality: Left;   I & D EXTREMITY Left 12/20/2021   Procedure: LEFT  SHOULDER DEBRIDEMENT;  Surgeon: Newt Minion, MD;  Location: [place];  Service: Orthopedics;  Laterality: Left;   I & D EXTREMITY Left 12/22/2021   Procedure: LEFT SHOULDER DEBRIDEMENT;  Surgeon: Newt Minion, MD;  Location: [place];  Service: Orthopedics;  Laterality: Left;   INCISION AND DRAINAGE Left 12/01/2021   Procedure: LEFT SHOULDER IRRIGATION AND EXCISIONAL DEBRIDEMENT;  Surgeon: Meredith Pel, MD;  Location: [place];  Service: Orthopedics;  Laterality: Left;   INCISION AND DRAINAGE HIP Left 11/10/2015   Procedure: IRRIGATION AND DEBRIDEMENT LEFT HIP INCISION;  Surgeon: Mcarthur Rossetti, MD;  Location: [place];  Service: Orthopedics;  Laterality: Left;   JOINT REPLACEMENT  2011   total left knee   KNEE ARTHROPLASTY  04/23/2012   right    KNEE ARTHROSCOPY     LUMBAR LAMINECTOMY/DECOMPRESSION MICRODISCECTOMY N/A 06/23/2019   Procedure: RIGHT LUMBAR FIVE THROUGH SACRAL ONE PARTIAL HEMILAMINECTOMY WITH RIGHT LUMBAR FIVE FORAMINOTOMY;  Surgeon: Jessy Oto, MD;  Location: [place];  Service: Orthopedics;  Laterality: N/A;   LUMBAR WOUND DEBRIDEMENT N/A 06/11/2018   Procedure:  LUMBAR WOUND DEBRIDEMENT;  Surgeon: Jessy Oto, MD;  Location: [place];  Service: Orthopedics;  Laterality: N/A;   RADIOLOGY WITH ANESTHESIA N/A 09/09/2018   Procedure: LUMBER SPINE WITHOUT CONTRAST;  Surgeon: Radiologist, Medication, MD;  Location: [place];  Service: Radiology;  Laterality: N/A;   REVERSE SHOULDER ARTHROPLASTY Left 11/16/2021   Procedure: LEFT REVERSE SHOULDER ARTHROPLASTY;  Surgeon: Meredith Pel, MD;  Location: [place];  Service: Orthopedics;  Laterality: Left;   ROTATOR CUFF REPAIR     left    SHOULDER SURGERY     right to repair ligament tear   TOTAL HIP ARTHROPLASTY Left 09/09/2015   Procedure: LEFT TOTAL HIP ARTHROPLASTY ANTERIOR APPROACH;  Surgeon: Mcarthur Rossetti, MD;  Location: [facility];  Service: Orthopedics;  Laterality: Left;   TOTAL KNEE ARTHROPLASTY  04/23/2012    Procedure: TOTAL KNEE ARTHROPLASTY;  Surgeon: Newt Minion, MD;  Location: [place];  Service: Orthopedics;  Laterality: Right;  Right Total Knee Arthroplasty   TUBAL LIGATION  1996   Patient Active Problem List   Diagnosis Date Noted   Lactic acidosis 12/18/2021   Dyspnea on exertion 12/18/2021   Anemia 12/18/2021   Gout 12/18/2021   Acute pain of left shoulder    Arthritis of left shoulder region    S/P reverse total shoulder arthroplasty, left 11/16/2021   Unilateral primary osteoarthritis, right hip 05/09/2021   S/P lumbar spinal fusion 05/09/2021   S/P insertion of spinal cord stimulator 05/09/2021   H/O total knee replacement, bilateral 01/18/2021   Lumbar post-laminectomy syndrome 04/06/2020   Long-term current use of opiate analgesic 11/04/2019   Leg pain, right 10/20/2019   Dysesthesia 10/20/2019   Lateral femoral cutaneous neuropathy, 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 difficile colitis 05/22/2019   Hypophosphatemia 05/21/2019   Syncope 05/20/2019   OSA on CPAP    Acute gastroenteritis    Anemia due to blood loss, acute 06/18/2018    Class: Acute   Plantar fasciitis of left foot 06/16/2018    Class: Chronic   Tendonitis, Achilles, right 06/16/2018    Class: Chronic   Osteochondral talar dome lesion 06/16/2018    Class: Chronic   Deep incisional surgical site infection    Superficial incisional surgical site infection 06/11/2018    Class: Acute   Right ankle effusion 06/11/2018    Class: Acute   Morbid (severe) obesity due to excess calories ([place]) 05/29/2018    Class: Chronic   Spondylolisthesis, lumbar region 05/27/2018    Class: Chronic   Spinal stenosis of lumbar region 05/27/2018    Class: Chronic   Urinary tract infection 05/27/2018    Class: Acute   Fusion of spine of lumbar region 05/27/2018   Pain of right hip joint 07/03/2017   Acute right-sided low back pain with  right-sided sciatica 03/27/2017   Chronic right shoulder pain 02/13/2017   History of rotator cuff tear 11/30/2016   Impingement syndrome of right shoulder 11/30/2016   Exacerbation of asthma 07/18/2016   Hypokalemia 07/18/2016   Hypertension 07/18/2016   Depression with anxiety 07/18/2016   Hypothyroidism 07/18/2016   Hyperglycemia 07/18/2016   Asthma, chronic 07/18/2016   Surgical wound dehiscence left hip; questionable superficial infection 11/10/2015   Postoperative wound infection 11/10/2015   Osteoarthritis of left hip 09/09/2015   Status post total replacement of left hip 09/09/2015   Obesity (BMI 35.0-39.9 without comorbidity) 04/28/2013    REFERRING  DIAG: chronic left shoulder pain, stiffness of left shoulder, not elsewhere classified  THERAPY DIAG:  Chronic left shoulder pain  Stiffness of left shoulder, not elsewhere classified  Rationale for Evaluation and Treatment Rehabilitation  PERTINENT HISTORY: Hypothyroidism, back surgery, bilateral TKA's, left THA, bilateral shoulder surgeries, HTN, OA.   PRECAUTIONS: Progress per reverse shoulder replacement protocol. No ultrasound. No lifting over 20#s  SUBJECTIVE: Reports shoulder pain today is about the same Neosporin on incision upon arrival. Called MD to call PT clinic  PAIN:  Are you having pain? Yes: NPRS scale: 5-6/10 Pain location: L shoulder Pain description: constant sore Aggravating factors: none Relieving factors: rest     TODAY'S TREATMENT:     Surgery date 11-16-21                               EXERCISE LOG    04/17/22  Exercise Repetitions and Resistance Comments  Pulley X 6 min   UE ranger LT Seated; flex x4 min, CW/ CCW circles x 2  min            Blank cell = exercise not performed today    Manual: AAROM punches, IR/ER in plane of scapula, gentle PROM, ball on table and cone lift to shoulder height shelf.     PT Long Term Goals - 04/05/22 1133      PT LONG TERM GOAL #1   Title Patient  will be independent with HEP    Time 6    Period Weeks    Status On-going      PT LONG TERM GOAL #2   Title Left active shoulder flexion to 125 degrees so the patient can easily reach overhead.    Time 6    Period Weeks    Status On-going      PT LONG TERM GOAL #3   Title Left active shoulder ER to 60 degrees+ to allow for easily donning/doffing of apparel.    Time 6    Period Weeks    Status On-going      PT LONG TERM GOAL #4   Title Perform ADL's with left shoulder pain not > 3/10.    Time 6    Period Weeks    Status On-going              Plan - 04/10/22 1133    Clinical Impression Statement Patient did well today with AAROM.  Added cone lifts to shoulder height.  Patient fatigued quickly.   Personal Factors and Comorbidities Comorbidity 1;Comorbidity 2;Other    Comorbidities Hypothyroidism, back surgery, bilateral TKA's, left THA, bilateral shoulder surgeries, HTN, OA.    Examination-Activity Limitations Other;Reach Overhead    Examination-Participation Restrictions Other    Stability/Clinical Decision Making Stable/Uncomplicated    Rehab Potential Good    PT Frequency 2x / week    PT Duration 6 weeks    PT Treatment/Interventions ADLs/Self Care Home Management;Cryotherapy;Electrical Stimulation;Moist Heat;Iontophoresis 4mg /ml Dexamethasone;Therapeutic activities;Therapeutic exercise;Patient/family education;Manual techniques;Passive range of motion;Dry needling;Vasopneumatic Device    PT Next Visit Plan Progress per reverse total shoulder protocol.  Begin with gentle left shoulder P/AROM, seated UE Ranger progressing to standing, pulleys.    Consulted and Agree with Plan of Care Patient         Progress Note Reporting Period  02/22/22 to 04/17/22.  See note below for Objective Data and Assessment of Progress/Goals. Definite improvement with regards to patient's left shoulder passive range of motion.  AAROM is also improving.  Goals ongoing at this time.     [place]  Arshad Oberholzer MPT

## 2022-04-20 ENCOUNTER — Ambulatory Visit: Payer: Medicare Other

## 2022-04-20 DIAGNOSIS — M25612 Stiffness of left shoulder, not elsewhere classified: Secondary | ICD-10-CM

## 2022-04-20 DIAGNOSIS — M25512 Pain in left shoulder: Secondary | ICD-10-CM | POA: Diagnosis not present

## 2022-04-20 DIAGNOSIS — G8929 Other chronic pain: Secondary | ICD-10-CM

## 2022-04-20 NOTE — Therapy (Signed)
OUTPATIENT PHYSICAL THERAPY TREATMENT NOTE   Patient Name: Marilyn [name] MRN: [id number] DOB:Aug 13, 1971, 51 y.o., female Today's Date: 04/20/2022   REFERRING PROVIDER: Cammy Copa, MD     PT End of Session - 04/20/22 1043     Visit Number 11    Number of Visits 12    Authorization Type FOTO AT LEAST EVERY 5TH VISIT.  PROGRESS NOTE AT 10TH VISIT.  KX MODIFIER AFTER 15 VISITS.    PT Start Time 1040    PT Stop Time 1130    PT Time Calculation (min) 50 min    Activity Tolerance Patient tolerated treatment well    Behavior During Therapy WFL for tasks assessed/performed              Past Medical History:  Diagnosis Date   Anemia    taking iron now. pt states having no current issues 09/02/2015   Anginal pain (HCC)    pt states experiences chest wall pain pt states related to her asthma    Anxiety    with MRI's   Arthritis    Everywhere   Asthma    Depression    Dizziness    GERD (gastroesophageal reflux disease)    Headache(784.0)    HX OF MIGRAINES   History of bronchitis    Hypertension    Hypothyroidism    takes levothyroxen   OSA on CPAP    wears cpap   Shortness of breath    with exertion   Wears glasses    Past Surgical History:  Procedure Laterality Date   APPLICATION OF WOUND VAC  11/16/2021   Procedure: APPLICATION OF WOUND VAC;  Surgeon: Cammy Copa, MD;  Location: [facility] OR;  Service: Orthopedics;;   APPLICATION OF WOUND VAC Left 12/01/2021   Procedure: APPLICATION OF WOUND VAC;  Surgeon: Cammy Copa, MD;  Location: [facility] OR;  Service: Orthopedics;  Laterality: Left;   BACK SURGERY     CESAREAN SECTION     times 2   CHOLECYSTECTOMY     COLONOSCOPY  2020   ENDOMETRIAL ABLATION     I & D EXTREMITY Left 12/06/2021   Procedure: LEFT SHOULDER DEBRIDEMENT AND WOUND CLOSURE;  Surgeon: [name], MD;  Location: [facility] OR;  Service: Orthopedics;  Laterality: Left;   I & D EXTREMITY Left 12/20/2021   Procedure: LEFT SHOULDER  DEBRIDEMENT;  Surgeon: [name], MD;  Location: [facility] OR;  Service: Orthopedics;  Laterality: Left;   I & D EXTREMITY Left 12/22/2021   Procedure: LEFT SHOULDER DEBRIDEMENT;  Surgeon: [name], MD;  Location: [facility] OR;  Service: Orthopedics;  Laterality: Left;   INCISION AND DRAINAGE Left 12/01/2021   Procedure: LEFT SHOULDER IRRIGATION AND EXCISIONAL DEBRIDEMENT;  Surgeon: Cammy Copa, MD;  Location: [facility] OR;  Service: Orthopedics;  Laterality: Left;   INCISION AND DRAINAGE HIP Left 11/10/2015   Procedure: IRRIGATION AND DEBRIDEMENT LEFT HIP INCISION;  Surgeon: Kathryne Hitch, MD;  Location: [facility] OR;  Service: Orthopedics;  Laterality: Left;   JOINT REPLACEMENT  2011   total left knee   KNEE ARTHROPLASTY  04/23/2012   right    KNEE ARTHROSCOPY     LUMBAR LAMINECTOMY/DECOMPRESSION MICRODISCECTOMY N/A 06/23/2019   Procedure: RIGHT LUMBAR FIVE THROUGH SACRAL ONE PARTIAL HEMILAMINECTOMY WITH RIGHT LUMBAR FIVE FORAMINOTOMY;  Surgeon: Kerrin Champagne, MD;  Location: [facility] OR;  Service: Orthopedics;  Laterality: N/A;   LUMBAR WOUND DEBRIDEMENT N/A 06/11/2018   Procedure: LUMBAR WOUND DEBRIDEMENT;  Surgeon: Kerrin Champagne, MD;  Location: [facility] OR;  Service: Orthopedics;  Laterality: N/A;   RADIOLOGY WITH ANESTHESIA N/A 09/09/2018   Procedure: LUMBER SPINE WITHOUT CONTRAST;  Surgeon: Radiologist, Medication, MD;  Location: [facility] OR;  Service: Radiology;  Laterality: N/A;   REVERSE SHOULDER ARTHROPLASTY Left 11/16/2021   Procedure: LEFT REVERSE SHOULDER ARTHROPLASTY;  Surgeon: Cammy Copa, MD;  Location: [facility] OR;  Service: Orthopedics;  Laterality: Left;   ROTATOR CUFF REPAIR     left    SHOULDER SURGERY     right to repair ligament tear   TOTAL HIP ARTHROPLASTY Left 09/09/2015   Procedure: LEFT TOTAL HIP ARTHROPLASTY ANTERIOR APPROACH;  Surgeon: Kathryne Hitch, MD;  Location: [facility];  Service: Orthopedics;  Laterality: Left;   TOTAL KNEE ARTHROPLASTY  04/23/2012   Procedure: TOTAL  KNEE ARTHROPLASTY;  Surgeon: [name], MD;  Location: [facility] OR;  Service: Orthopedics;  Laterality: Right;  Right Total Knee Arthroplasty   TUBAL LIGATION  1996   Patient Active Problem List   Diagnosis Date Noted   Lactic acidosis 12/18/2021   Dyspnea on exertion 12/18/2021   Anemia 12/18/2021   Gout 12/18/2021   Acute pain of left shoulder    Arthritis of left shoulder region    S/P reverse total shoulder arthroplasty, left 11/16/2021   Unilateral primary osteoarthritis, right hip 05/09/2021   S/P lumbar spinal fusion 05/09/2021   S/P insertion of spinal cord stimulator 05/09/2021   H/O total knee replacement, bilateral 01/18/2021   Lumbar post-laminectomy syndrome 04/06/2020   Long-term current use of opiate analgesic 11/04/2019   Leg pain, right 10/20/2019   Dysesthesia 10/20/2019   Lateral femoral cutaneous neuropathy, 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 difficile colitis 05/22/2019   Hypophosphatemia 05/21/2019   Syncope 05/20/2019   OSA on CPAP    Acute gastroenteritis    Anemia due to blood loss, acute 06/18/2018    Class: Acute   Plantar fasciitis of left foot 06/16/2018    Class: Chronic   Tendonitis, Achilles, right 06/16/2018    Class: Chronic   Osteochondral talar dome lesion 06/16/2018    Class: Chronic   Deep incisional surgical site infection    Superficial incisional surgical site infection 06/11/2018    Class: Acute   Right ankle effusion 06/11/2018    Class: Acute   Morbid (severe) obesity due to excess calories (HCC) 05/29/2018    Class: Chronic   Spondylolisthesis, lumbar region 05/27/2018    Class: Chronic   Spinal stenosis of lumbar region 05/27/2018    Class: Chronic   Urinary tract infection 05/27/2018    Class: Acute   Fusion of spine of lumbar region 05/27/2018   Pain of right hip joint 07/03/2017   Acute right-sided low back pain with right-sided  sciatica 03/27/2017   Chronic right shoulder pain 02/13/2017   History of rotator cuff tear 11/30/2016   Impingement syndrome of right shoulder 11/30/2016   Exacerbation of asthma 07/18/2016   Hypokalemia 07/18/2016   Hypertension 07/18/2016   Depression with anxiety 07/18/2016   Hypothyroidism 07/18/2016   Hyperglycemia 07/18/2016   Asthma, chronic 07/18/2016   Surgical wound dehiscence left hip; questionable superficial infection 11/10/2015   Postoperative wound infection 11/10/2015   Osteoarthritis of left hip 09/09/2015   Status post total replacement of left hip 09/09/2015   Obesity (BMI 35.0-39.9 without comorbidity) 04/28/2013    REFERRING DIAG: chronic left shoulder  pain, stiffness of left shoulder, not elsewhere classified  THERAPY DIAG:  Chronic left shoulder pain  Stiffness of left shoulder, not elsewhere classified  Rationale for Evaluation and Treatment Rehabilitation  PERTINENT HISTORY: Hypothyroidism, back surgery, bilateral TKA's, left THA, bilateral shoulder surgeries, HTN, OA.   PRECAUTIONS: Progress per reverse shoulder replacement protocol. No ultrasound. No lifting over 20#s  SUBJECTIVE: Pt reports increased RLE pain today and shoulder isn't as bad today.   PAIN:  Are you having pain? Yes: NPRS scale: 4/10 Pain location: L shoulder Pain description: constant sore Aggravating factors: none Relieving factors: rest     TODAY'S TREATMENT:     Surgery date 11-16-21                               EXERCISE LOG    04/17/22  Exercise Repetitions and Resistance Comments  Pulley X 6 min   UE ranger LT Seated; flex x4 min, CW/ CCW circles x 2  min   Cone to shoulder height 10 cones x 2 set   Ball on table X5 mins    Blank cell = exercise not performed today   Modalities  Date:  Unattended Estim: Shoulder, IFC 80-150 Hz, 15 mins, Pain and Tone Cold Pack: Shoulder, 15 mins, Pain                PT Long Term Goals - 04/05/22 1133      PT LONG  TERM GOAL #1   Title Patient will be independent with HEP    Time 6    Period Weeks    Status On-going      PT LONG TERM GOAL #2   Title Left active shoulder flexion to 125 degrees so the patient can easily reach overhead.    Time 6    Period Weeks    Status On-going      PT LONG TERM GOAL #3   Title Left active shoulder ER to 60 degrees+ to allow for easily donning/doffing of apparel.    Time 6    Period Weeks    Status On-going      PT LONG TERM GOAL #4   Title Perform ADL's with left shoulder pain not > 3/10.    Time 6    Period Weeks    Status On-going              Plan - 04/10/22 1133    Clinical Impression Statement Pt arrives for today's treatment session reporting 5/10 left shoulder pain.  Pt also reports increased right hip and low back paint today.  Pt tolerated treatment well.  Normal responses to estim and cold pack noted upon removal.  Pt reported 4/10 left shoulder pain at completion of today's treatment session.    Personal Factors and Comorbidities Comorbidity 1;Comorbidity 2;Other    Comorbidities Hypothyroidism, back surgery, bilateral TKA's, left THA, bilateral shoulder surgeries, HTN, OA.    Examination-Activity Limitations Other;Reach Overhead    Examination-Participation Restrictions Other    Stability/Clinical Decision Making Stable/Uncomplicated    Rehab Potential Good    PT Frequency 2x / week    PT Duration 6 weeks    PT Treatment/Interventions ADLs/Self Care Home Management;Cryotherapy;Electrical Stimulation;Moist Heat;Iontophoresis 4mg /ml Dexamethasone;Therapeutic activities;Therapeutic exercise;Patient/family education;Manual techniques;Passive range of motion;Dry needling;Vasopneumatic Device    PT Next Visit Plan Progress per reverse total shoulder protocol.  Begin with gentle left shoulder P/AROM, seated UE Ranger progressing to standing, pulleys.  Consulted and Agree with Plan of Care Patient              Rexene Agent,  PTA

## 2022-04-24 ENCOUNTER — Encounter: Payer: Medicare Other | Admitting: Physical Therapy

## 2022-04-27 ENCOUNTER — Encounter: Payer: Self-pay | Admitting: Physical Therapy

## 2022-04-27 ENCOUNTER — Ambulatory Visit: Payer: Medicare Other | Admitting: Physical Therapy

## 2022-04-27 DIAGNOSIS — M25612 Stiffness of left shoulder, not elsewhere classified: Secondary | ICD-10-CM

## 2022-04-27 DIAGNOSIS — M25512 Pain in left shoulder: Secondary | ICD-10-CM | POA: Diagnosis not present

## 2022-04-27 DIAGNOSIS — G8929 Other chronic pain: Secondary | ICD-10-CM

## 2022-04-27 NOTE — Therapy (Addendum)
OUTPATIENT PHYSICAL THERAPY TREATMENT NOTE   Patient Name: Marilyn [name] MRN: [id number] DOB:1971-01-09, 51 y.o., female Today's Date: 04/27/2022   REFERRING PROVIDER: Meredith Pel, MD     PT End of Session - 04/27/22 1030     Visit Number 12    Number of Visits 12    Date for PT Re-Evaluation 05/21/22    Authorization Type FOTO AT LEAST EVERY 5TH VISIT.  PROGRESS NOTE AT 10TH VISIT.  KX MODIFIER AFTER 15 VISITS.    PT Start Time 1017    PT Stop Time 1106    PT Time Calculation (min) 49 min    Activity Tolerance Patient tolerated treatment well    Behavior During Therapy WFL for tasks assessed/performed              Past Medical History:  Diagnosis Date   Anemia    taking iron now. pt states having no current issues 09/02/2015   Anginal pain (HCC)    pt states experiences chest wall pain pt states related to her asthma    Anxiety    with MRI's   Arthritis    Everywhere   Asthma    Depression    Dizziness    GERD (gastroesophageal reflux disease)    Headache(784.0)    HX OF MIGRAINES   History of bronchitis    Hypertension    Hypothyroidism    takes levothyroxen   OSA on CPAP    wears cpap   Shortness of breath    with exertion   Wears glasses    Past Surgical History:  Procedure Laterality Date   APPLICATION OF WOUND VAC  11/16/2021   Procedure: APPLICATION OF WOUND VAC;  Surgeon: Meredith Pel, MD;  Location: [place];  Service: Orthopedics;;   APPLICATION OF WOUND VAC Left 12/01/2021   Procedure: APPLICATION OF WOUND VAC;  Surgeon: Meredith Pel, MD;  Location: [place];  Service: Orthopedics;  Laterality: Left;   BACK SURGERY     CESAREAN SECTION     times 2   CHOLECYSTECTOMY     COLONOSCOPY  2020   ENDOMETRIAL ABLATION     I & D EXTREMITY Left 12/06/2021   Procedure: LEFT SHOULDER DEBRIDEMENT AND WOUND CLOSURE;  Surgeon: Newt Minion, MD;  Location: [place];  Service: Orthopedics;  Laterality: Left;   I & D EXTREMITY Left 12/20/2021    Procedure: LEFT SHOULDER DEBRIDEMENT;  Surgeon: Newt Minion, MD;  Location: [place];  Service: Orthopedics;  Laterality: Left;   I & D EXTREMITY Left 12/22/2021   Procedure: LEFT SHOULDER DEBRIDEMENT;  Surgeon: Newt Minion, MD;  Location: [place];  Service: Orthopedics;  Laterality: Left;   INCISION AND DRAINAGE Left 12/01/2021   Procedure: LEFT SHOULDER IRRIGATION AND EXCISIONAL DEBRIDEMENT;  Surgeon: Meredith Pel, MD;  Location: [place];  Service: Orthopedics;  Laterality: Left;   INCISION AND DRAINAGE HIP Left 11/10/2015   Procedure: IRRIGATION AND DEBRIDEMENT LEFT HIP INCISION;  Surgeon: Mcarthur Rossetti, MD;  Location: [place];  Service: Orthopedics;  Laterality: Left;   JOINT REPLACEMENT  2011   total left knee   KNEE ARTHROPLASTY  04/23/2012   right    KNEE ARTHROSCOPY     LUMBAR LAMINECTOMY/DECOMPRESSION MICRODISCECTOMY N/A 06/23/2019   Procedure: RIGHT LUMBAR FIVE THROUGH SACRAL ONE PARTIAL HEMILAMINECTOMY WITH RIGHT LUMBAR FIVE FORAMINOTOMY;  Surgeon: Jessy Oto, MD;  Location: [place];  Service: Orthopedics;  Laterality: N/A;   LUMBAR WOUND DEBRIDEMENT N/A  06/11/2018   Procedure: LUMBAR WOUND DEBRIDEMENT;  Surgeon: Jessy Oto, MD;  Location: [place];  Service: Orthopedics;  Laterality: N/A;   RADIOLOGY WITH ANESTHESIA N/A 09/09/2018   Procedure: LUMBER SPINE WITHOUT CONTRAST;  Surgeon: Radiologist, Medication, MD;  Location: [place];  Service: Radiology;  Laterality: N/A;   REVERSE SHOULDER ARTHROPLASTY Left 11/16/2021   Procedure: LEFT REVERSE SHOULDER ARTHROPLASTY;  Surgeon: Meredith Pel, MD;  Location: [place];  Service: Orthopedics;  Laterality: Left;   ROTATOR CUFF REPAIR     left    SHOULDER SURGERY     right to repair ligament tear   TOTAL HIP ARTHROPLASTY Left 09/09/2015   Procedure: LEFT TOTAL HIP ARTHROPLASTY ANTERIOR APPROACH;  Surgeon: Mcarthur Rossetti, MD;  Location: [facility];  Service: Orthopedics;  Laterality: Left;   TOTAL KNEE ARTHROPLASTY   04/23/2012   Procedure: TOTAL KNEE ARTHROPLASTY;  Surgeon: Newt Minion, MD;  Location: [place];  Service: Orthopedics;  Laterality: Right;  Right Total Knee Arthroplasty   TUBAL LIGATION  1996   Patient Active Problem List   Diagnosis Date Noted   Lactic acidosis 12/18/2021   Dyspnea on exertion 12/18/2021   Anemia 12/18/2021   Gout 12/18/2021   Acute pain of left shoulder    Arthritis of left shoulder region    S/P reverse total shoulder arthroplasty, left 11/16/2021   Unilateral primary osteoarthritis, right hip 05/09/2021   S/P lumbar spinal fusion 05/09/2021   S/P insertion of spinal cord stimulator 05/09/2021   H/O total knee replacement, bilateral 01/18/2021   Lumbar post-laminectomy syndrome 04/06/2020   Long-term current use of opiate analgesic 11/04/2019   Leg pain, right 10/20/2019   Dysesthesia 10/20/2019   Lateral femoral cutaneous neuropathy, 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 difficile colitis 05/22/2019   Hypophosphatemia 05/21/2019   Syncope 05/20/2019   OSA on CPAP    Acute gastroenteritis    Anemia due to blood loss, acute 06/18/2018    Class: Acute   Plantar fasciitis of left foot 06/16/2018    Class: Chronic   Tendonitis, Achilles, right 06/16/2018    Class: Chronic   Osteochondral talar dome lesion 06/16/2018    Class: Chronic   Deep incisional surgical site infection    Superficial incisional surgical site infection 06/11/2018    Class: Acute   Right ankle effusion 06/11/2018    Class: Acute   Morbid (severe) obesity due to excess calories ([place]) 05/29/2018    Class: Chronic   Spondylolisthesis, lumbar region 05/27/2018    Class: Chronic   Spinal stenosis of lumbar region 05/27/2018    Class: Chronic   Urinary tract infection 05/27/2018    Class: Acute   Fusion of spine of lumbar region 05/27/2018   Pain of right hip joint 07/03/2017   Acute right-sided low  back pain with right-sided sciatica 03/27/2017   Chronic right shoulder pain 02/13/2017   History of rotator cuff tear 11/30/2016   Impingement syndrome of right shoulder 11/30/2016   Exacerbation of asthma 07/18/2016   Hypokalemia 07/18/2016   Hypertension 07/18/2016   Depression with anxiety 07/18/2016   Hypothyroidism 07/18/2016   Hyperglycemia 07/18/2016   Asthma, chronic 07/18/2016   Surgical wound dehiscence left hip; questionable superficial infection 11/10/2015   Postoperative wound infection 11/10/2015   Osteoarthritis of left hip 09/09/2015   Status post total replacement of left hip 09/09/2015   Obesity (BMI 35.0-39.9 without comorbidity) 04/28/2013  REFERRING DIAG: chronic left shoulder pain, stiffness of left shoulder, not elsewhere classified  THERAPY DIAG:  Chronic left shoulder pain  Stiffness of left shoulder, not elsewhere classified  Rationale for Evaluation and Treatment Rehabilitation  PERTINENT HISTORY: Hypothyroidism, back surgery, bilateral TKA's, left THA, bilateral shoulder surgeries, HTN, OA.   PRECAUTIONS: Progress per reverse shoulder replacement protocol. No ultrasound. No lifting over 20#s  SUBJECTIVE: Pain isn't reported as bad today. Having more LE pain recently.  PAIN:  Are you having pain? Yes: NPRS scale: 1-2/10 Pain location: L shoulder Pain description: constant sore Aggravating factors: none Relieving factors: rest     TODAY'S TREATMENT:     Surgery date 11-16-21                               EXERCISE LOG    04/27/2022  Exercise Repetitions and Resistance Comments  Pulley X 6 min   UE ranger LT Seated; flex x4 min, CW/ CCW circles x 2  min   Cone to shoulder height 10 cones x 2 set   Ball on table X3 mins   Wall ladder X10 reps (max 21)    Blank cell = exercise not performed today   Modalities  Date: 04/27/2022 Unattended Estim: Shoulder, IFC, 15 mins, Pain    PT Long Term Goals - 04/05/22 1133      PT LONG TERM GOAL  #1   Title Patient will be independent with HEP    Time 6    Period Weeks    Status Unable to assess     PT LONG TERM GOAL #2   Title Left active shoulder flexion to 125 degrees so the patient can easily reach overhead.    Time 6    Period Weeks    Status Not met     PT LONG TERM GOAL #3   Title Left active shoulder ER to 60 degrees+ to allow for easily donning/doffing of apparel.    Time 6    Period Weeks    Status Not met     PT LONG TERM GOAL #4   Title Perform ADL's with left shoulder pain not > 3/10.    Time 6    Period Weeks    Status Not met             Plan - 04/10/22 1133    Clinical Impression Statement Patient presented in clinic with reports of more LE pain and not so much UEs. Patient fatigued greatly with attempts at cone to cabinet in antigravity. No excessive issue with eccentric from cabinet to countertop. Normal stimulation response noted following removal. 45% FOTO limitation at 12th visit.   Personal Factors and Comorbidities Comorbidity 1;Comorbidity 2;Other    Comorbidities Hypothyroidism, back surgery, bilateral TKA's, left THA, bilateral shoulder surgeries, HTN, OA.    Examination-Activity Limitations Other;Reach Overhead    Examination-Participation Restrictions Other    Stability/Clinical Decision Making Stable/Uncomplicated    Rehab Potential Good    PT Frequency 2x / week    PT Duration 6 weeks    PT Treatment/Interventions ADLs/Self Care Home Management;Cryotherapy;Electrical Stimulation;Moist Heat;Iontophoresis 15m/ml Dexamethasone;Therapeutic activities;Therapeutic exercise;Patient/family education;Manual techniques;Passive range of motion;Dry needling;Vasopneumatic Device    PT Next Visit Plan Wait until MD visit   Consulted and Agree with Plan of Care Patient         KStandley Brooking PTA 04/27/22 12:18 PM

## 2022-05-01 ENCOUNTER — Ambulatory Visit: Payer: Self-pay

## 2022-05-01 ENCOUNTER — Ambulatory Visit (INDEPENDENT_AMBULATORY_CARE_PROVIDER_SITE_OTHER): Payer: Medicare Other | Admitting: Physical Medicine and Rehabilitation

## 2022-05-01 ENCOUNTER — Encounter: Payer: Self-pay | Admitting: Physical Medicine and Rehabilitation

## 2022-05-01 DIAGNOSIS — M25551 Pain in right hip: Secondary | ICD-10-CM | POA: Diagnosis not present

## 2022-05-01 NOTE — Progress Notes (Signed)
   Marilyn [name] - 51 y.o. female MRN [id number]  Date of birth: 1971-08-16  Office Visit Note: Visit Date: 05/01/2022 PCP: Rebecka Apley, NP Referred by: Rebecka Apley, NP  Subjective: Chief Complaint  Patient presents with   Right Hip - Pain   HPI:  Marilyn [name] is a 51 y.o. female who comes in today at the request of Dr. Doneen Poisson for planned Right anesthetic hip arthrogram with fluoroscopic guidance.  The patient has failed conservative care including home exercise, medications, time and activity modification.  This injection will be diagnostic and hopefully therapeutic.  Please see requesting physician notes for further details and justification.   ROS Otherwise per HPI.  Assessment & Plan: Visit Diagnoses:    ICD-10-CM   1. Pain in right hip  M25.551 Large Joint Inj: R hip joint    XR C-ARM NO REPORT      Plan: No additional findings.   Meds & Orders: No orders of the defined types were placed in this encounter.   Orders Placed This Encounter  Procedures   Large Joint Inj: R hip joint   XR C-ARM NO REPORT    Follow-up: Return in about 2 weeks (around 05/15/2022) for Doneen Poisson, MD.   Procedures: Large Joint Inj: R hip joint on 05/01/2022 2:24 PM Indications: diagnostic evaluation and pain Details: 22 G 3.5 in needle, fluoroscopy-guided anterior approach  Arthrogram: No  Medications: 4 mL bupivacaine 0.25 %; 60 mg triamcinolone acetonide 40 MG/ML Outcome: tolerated well, no immediate complications  There was excellent flow of contrast producing a partial arthrogram of the hip. The patient did have relief of symptoms during the anesthetic phase of the injection. Procedure, treatment alternatives, risks and benefits explained, specific risks discussed. Consent was given by the patient. Immediately prior to procedure a time out was called to verify the correct patient, procedure, equipment, support staff and site/side marked as  required. Patient was prepped and draped in the usual sterile fashion.          Clinical History: No specialty comments available.     Objective:  VS:  HT:    WT:   BMI:     BP:   HR: bpm  TEMP: ( )  RESP:  Physical Exam   Imaging: No results found.

## 2022-05-01 NOTE — Progress Notes (Signed)
Pt state right hip pain. Pt sate walking and standing make the pain worse. Pt state she takes over the counter pain meds to help ease her pain.  Numeric Pain Rating Scale and Functional Assessment Average Pain 7   In the last MONTH (on 0-10 scale) has pain interfered with the following?  1. General activity like being  able to carry out your everyday physical activities such as walking, climbing stairs, carrying groceries, or moving a chair?  Rating(10)    -BT, -Dye Allergies.

## 2022-05-06 MED ORDER — TRIAMCINOLONE ACETONIDE 40 MG/ML IJ SUSP
60.0000 mg | INTRAMUSCULAR | Status: AC | PRN
  Administered 2022-05-01: 60 mg via INTRA_ARTICULAR

## 2022-05-06 MED ORDER — BUPIVACAINE HCL 0.25 % IJ SOLN
4.0000 mL | INTRAMUSCULAR | Status: AC | PRN
  Administered 2022-05-01: 4 mL via INTRA_ARTICULAR

## 2022-05-07 ENCOUNTER — Ambulatory Visit (INDEPENDENT_AMBULATORY_CARE_PROVIDER_SITE_OTHER): Payer: Medicare Other | Admitting: Orthopaedic Surgery

## 2022-05-07 ENCOUNTER — Encounter: Payer: Self-pay | Admitting: Orthopaedic Surgery

## 2022-05-07 DIAGNOSIS — M25551 Pain in right hip: Secondary | ICD-10-CM

## 2022-05-07 DIAGNOSIS — M1611 Unilateral primary osteoarthritis, right hip: Secondary | ICD-10-CM | POA: Diagnosis not present

## 2022-05-07 DIAGNOSIS — Z6841 Body Mass Index (BMI) 40.0 and over, adult: Secondary | ICD-10-CM

## 2022-05-07 NOTE — Progress Notes (Signed)
The patient comes in today after having a steroid injection under fluoroscopy in her right hip.  It is noted arthritic hip.  She said the injection really did not help at all.  She does ambulate with a cane.  She is someone who is morbidly obese with a BMI I believe in the 60 range.  I replaced her left hip a very long time ago and that hip developed an infection after surgery.  She has had a shoulder replacement this year by one of our partners and that has been infected twice requiring surgery.  She has a history of both her knees being replaced and extensive lumbar spine surgery.  She has an an extensive soft tissue envelope around her right hip and at this point I am not comfortable performing any type of hip replacement surgery given her obesity combined with the massive soft tissue around her hip and given the fact that she has had infections with the knee joint there is been replaced over the last few years.  She is definitely not a surgical candidate from my standpoint.  She would have to lose a significant mount of weight before this could be considered and I explained this to her.

## 2022-05-13 ENCOUNTER — Encounter (HOSPITAL_COMMUNITY): Payer: Self-pay | Admitting: Emergency Medicine

## 2022-05-13 ENCOUNTER — Emergency Department (HOSPITAL_COMMUNITY)
Admission: EM | Admit: 2022-05-13 | Discharge: 2022-05-13 | Disposition: A | Discharge status: Home / Self Care | Payer: Medicare Other | Attending: Emergency Medicine | Admitting: Emergency Medicine

## 2022-05-13 ENCOUNTER — Other Ambulatory Visit: Payer: Self-pay

## 2022-05-13 DIAGNOSIS — Z7982 Long term (current) use of aspirin: Secondary | ICD-10-CM | POA: Diagnosis not present

## 2022-05-13 DIAGNOSIS — Z91040 Latex allergy status: Secondary | ICD-10-CM | POA: Insufficient documentation

## 2022-05-13 DIAGNOSIS — Z20822 Contact with and (suspected) exposure to covid-19: Secondary | ICD-10-CM | POA: Diagnosis not present

## 2022-05-13 DIAGNOSIS — J45909 Unspecified asthma, uncomplicated: Secondary | ICD-10-CM | POA: Insufficient documentation

## 2022-05-13 DIAGNOSIS — Z79899 Other long term (current) drug therapy: Secondary | ICD-10-CM | POA: Diagnosis not present

## 2022-05-13 DIAGNOSIS — J029 Acute pharyngitis, unspecified: Secondary | ICD-10-CM | POA: Diagnosis present

## 2022-05-13 DIAGNOSIS — I10 Essential (primary) hypertension: Secondary | ICD-10-CM | POA: Insufficient documentation

## 2022-05-13 LAB — RESP PANEL BY RT-PCR (FLU A&B, COVID) ARPGX2
Influenza A by PCR: NEGATIVE
Influenza B by PCR: NEGATIVE
SARS Coronavirus 2 by RT PCR: NEGATIVE

## 2022-05-13 LAB — GROUP A STREP BY PCR: Group A Strep by PCR: NOT DETECTED

## 2022-05-13 MED ORDER — DEXAMETHASONE SODIUM PHOSPHATE 10 MG/ML IJ SOLN
10.0000 mg | Freq: Once | INTRAMUSCULAR | Status: AC
  Administered 2022-05-13: 10 mg via INTRAMUSCULAR
  Filled 2022-05-13: qty 1

## 2022-05-13 NOTE — Discharge Instructions (Signed)
Your strep test was negative.  The steroid should help reduce inflammation over the next few days.  I suspect you have a viral pharyngitis, this should resolve on its own.  You may take over-the-counter Tylenol or Motrin for pain.  Return to the ED if you are unable to swallow, difficulty breathing, facial swelling.  Otherwise follow-up with your primary next week for reevaluation if symptoms persist.

## 2022-05-13 NOTE — ED Triage Notes (Signed)
Pt to the ED with a sore throat since she woke this morning.  Pt has lost her voice.

## 2022-05-13 NOTE — ED Provider Notes (Signed)
[facility] EMERGENCY DEPARTMENT Provider Note   CSN: [id number] Arrival date & time: 05/13/22  1331     History  Chief Complaint  Patient presents with   Sore Throat    Marilyn [name] is a 51 y.o. female.   Sore Throat     Patient with medical history of asthma, hypertension, OSA on CPAP presents today due to sore throat.  States she woke up this morning with a sore throat, worse with swallowing.  States she feels like her voice is hoarse and talking hurts.  No cough, no fevers at home, dental pain, recent dental procedures, AP, N, V, rashes, cough, throat swelilng.  Has not taken any medicine prior to arrival.  Home Medications Prior to Admission medications   Medication Sig Start Date End Date Taking? Authorizing Provider  albuterol (VENTOLIN HFA) 108 (90 Base) MCG/ACT inhaler Inhale 1-2 puffs into the lungs every 6 (six) hours as needed for wheezing or shortness of breath. 12/09/19   Henderly, Britni A, PA-C  allopurinol (ZYLOPRIM) 100 MG tablet Take 1 tablet (100 mg total) by mouth 2 (two) times daily. 02/13/22 05/14/22  Jessy Oto, MD  ammonium lactate (LAC-HYDRIN) 12 % lotion Apply 1 application. topically 2 (two) times daily as needed for dry skin. 07/17/21   [provider]  amoxicillin (AMOXIL) 500 MG tablet Take 2 grams (#4 tablets) one hour prior to dental procedure. 02/09/22   Magnant, Charles L, PA-C  ascorbic Acid (VITAMIN C) 500 MG CPCR Take 500 mg by mouth daily. Gummy    [provider]  aspirin EC 81 MG EC tablet Take 1 tablet (81 mg total) by mouth daily. Swallow whole. 11/19/21   Magnant, Charles L, PA-C  buPROPion (WELLBUTRIN XL) 300 MG 24 hr tablet Take 300 mg by mouth daily. 06/29/20   [provider]  cetirizine (ZYRTEC) 10 MG tablet Take 10 mg by mouth daily.    [provider]  clotrimazole-betamethasone (LOTRISONE) cream Apply 1 application topically daily as needed (irritation). 10/25/19   [provider]   cycloSPORINE (RESTASIS) 0.05 % ophthalmic emulsion Place 1 drop into both eyes 2 (two) times daily as needed (dry eyes).    [provider]  DULoxetine (CYMBALTA) 60 MG capsule Take 60 mg by mouth 2 (two) times daily. 05/02/21   [provider]  Ferrous Gluconate 324 (37.5 Fe) MG TABS Take 324 mg by mouth daily. 02/14/20   [provider]  furosemide (LASIX) 20 MG tablet Take 20 mg by mouth daily.    [provider]  gabapentin (NEURONTIN) 400 MG capsule Take 400 mg by mouth 2 (two) times daily.    [provider]  hydrochlorothiazide (HYDRODIURIL) 25 MG tablet Take 25 mg by mouth daily. 09/15/21   [provider]  levothyroxine (SYNTHROID) 150 MCG tablet Take 150 mcg by mouth daily before breakfast. 05/18/19   [provider]  methocarbamol (ROBAXIN) 500 MG tablet Take 1 tablet (500 mg total) by mouth every 8 (eight) hours as needed for muscle spasms. 01/18/22   Magnant, Gerrianne Scale, PA-C  Misc. Devices MISC Below the knee compression hose style calf Extra large size 1 pair 15-20 mmHg    Fax to [facility] in [place], [name] Patient not taking: Reported on 12/17/2021 06/29/20   [provider]  NARCAN 4 MG/0.1ML LIQD nasal spray kit Place 0.4 mg into the nose once as needed (opiate overdose). 06/28/17   [provider]  oxybutynin (DITROPAN XL) 15 MG 24  hr tablet Take 15 mg by mouth daily.  05/31/17   [provider]  oxyCODONE-acetaminophen (PERCOCET) 5-325 MG tablet Take 1 tablet by mouth every 12 (twelve) hours as needed for severe pain. 02/23/22 02/23/23  Magnant, Charles L, PA-C  VICTOZA 18 MG/3ML SOPN Inject 1.8 mg into the skin daily. 11/16/21   [provider]  vitamin B-12 (CYANOCOBALAMIN) 250 MCG tablet Take 250 mcg by mouth daily.    [provider]  Vitamin D, Ergocalciferol, (DRISDOL) 1.25 MG (50000 UNIT) CAPS capsule Take 50,000 Units by mouth every Monday.    [provider]   zolpidem (AMBIEN) 10 MG tablet Take 10 mg by mouth at bedtime. 12/01/21   [provider]      Allergies    Lisinopril, Bee venom, Bupropion, Latex, Propoxyphene, Methadone, Codeine, Meloxicam, Morphine and related, Tegaderm ag mesh [silver], and Tomato    Review of Systems   Review of Systems  Physical Exam Updated Vital Signs BP (!) 152/77 (BP Location: Right Arm)   Pulse 63   Temp 99 F (37.2 C) (Oral)   Resp 18   Ht '5\' 3"'  (1.6 m)   Wt (!) 167.8 kg   LMP  (LMP Unknown) Comment: tubal ligation  SpO2 100%   BMI 65.53 kg/m  Physical Exam Vitals and nursing note reviewed. Exam conducted with a chaperone present.  Constitutional:      General: She is not in acute distress.    Appearance: Normal appearance.  HENT:     Head: Normocephalic and atraumatic.     Mouth/Throat:     Pharynx: Uvula midline. Posterior oropharyngeal erythema present.     Tonsils: No tonsillar abscesses.     Comments: No sublingual swelling or tenderness. Uvula midline.  Eyes:     General: No scleral icterus.    Extraocular Movements: Extraocular movements intact.     Pupils: Pupils are equal, round, and reactive to light.  Cardiovascular:     Rate and Rhythm: Normal rate and regular rhythm.  Pulmonary:     Effort: Pulmonary effort is normal.     Breath sounds: Normal breath sounds.  Skin:    Coloration: Skin is not jaundiced.  Neurological:     Mental Status: She is alert. Mental status is at baseline.     Coordination: Coordination normal.     ED Results / Procedures / Treatments   Labs (all labs ordered are listed, but only abnormal results are displayed) Labs Reviewed  GROUP A STREP BY PCR  RESP PANEL BY RT-PCR (FLU A&B, COVID) [id number]    EKG None  Radiology No results found.  Procedures Procedures    Medications Ordered in ED Medications  dexamethasone (DECADRON) injection 10 mg (10 mg Intramuscular Given 05/13/22 1426)    ED Course/ Medical Decision Making/  A&P                           Medical Decision Making Risk Prescription drug management.   Patient presents due to sore throat.  Differential includes not limited to strep pharyngitis, viral pharyngitis, peritonsillar abscess, pharyngeal abscess, Ludewig angina.  On exam uvula is midline, there is erythema to the posterior oropharynx.  Normal phonation, handling secretions.  Lungs are clear to auscultation, no signs of Ludwig angina, peritonsillar abscess or retropharyngeal abscess.  Strep test is negative, COVID flu test is pending.  I ordered IM Decadron for the patient's symptoms which helped somewhat.    Do not  think antibiotics indicated given negative strep test.  Consider additional work-up but based on acuity and physical exam I think this is most likely a viral pharyngitis.  Return precautions were discussed with the patient, discharge stable condition.        Final Clinical Impression(s) / ED Diagnoses Final diagnoses:  None    Rx / DC Orders ED Discharge Orders     None         Sherrill Raring, Hershal Coria 05/13/22 1431    Isla Pence, MD 05/13/22 1600

## 2022-05-14 ENCOUNTER — Other Ambulatory Visit (HOSPITAL_COMMUNITY): Payer: Self-pay | Admitting: Adult Health Nurse Practitioner

## 2022-05-14 ENCOUNTER — Ambulatory Visit: Payer: Medicare Other | Admitting: Orthopedic Surgery

## 2022-05-14 DIAGNOSIS — Z1231 Encounter for screening mammogram for malignant neoplasm of breast: Secondary | ICD-10-CM

## 2022-05-23 ENCOUNTER — Ambulatory Visit (INDEPENDENT_AMBULATORY_CARE_PROVIDER_SITE_OTHER): Payer: Medicare Other | Admitting: Orthopedic Surgery

## 2022-05-23 ENCOUNTER — Encounter: Payer: Self-pay | Admitting: Orthopedic Surgery

## 2022-05-23 DIAGNOSIS — Z96612 Presence of left artificial shoulder joint: Secondary | ICD-10-CM

## 2022-05-23 NOTE — Progress Notes (Signed)
Post-Op Visit Note   Patient: Marilyn [name]           Date of Birth: 11-26-1970           MRN: [id number] Visit Date: 05/23/2022 PCP: Rebecka Apley, NP   Assessment & Plan:  Chief Complaint:  Chief Complaint  Patient presents with   Left Shoulder - Wound Check   Visit Diagnoses:  1. Status post reverse arthroplasty of left shoulder     Plan: Marilyn [name] is a 51 year old patient here to recheck her incision around the left reverse replacement.  Her shoulder is doing well.  The incision is healing nicely over a 3 cm longitudinal length with secondary intention.  No fluctuance erythema or drainage.  Overall the incision is improved.  Follow-up in 6 weeks for final check.  Follow-Up Instructions: Return in about 6 weeks (around 07/04/2022).   Orders:  No orders of the defined types were placed in this encounter.  No orders of the defined types were placed in this encounter.   Imaging: No results found.  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 difficile colitis 05/22/2019   Hypophosphatemia 05/21/2019   Syncope 05/20/2019   OSA on CPAP    Acute gastroenteritis    Anemia due to blood loss, acute  06/18/2018    Class: Acute   Plantar fasciitis of left foot 06/16/2018    Class: Chronic   Tendonitis, Achilles, right 06/16/2018    Class: Chronic   Osteochondral talar dome lesion 06/16/2018    Class: Chronic   Deep incisional surgical site infection    Superficial incisional surgical site infection 06/11/2018    Class: Acute   Right ankle effusion 06/11/2018    Class: Acute   Morbid (severe) obesity due to excess calories (HCC) 05/29/2018    Class: Chronic   Spondylolisthesis, lumbar region 05/27/2018    Class: Chronic   Spinal stenosis of lumbar region 05/27/2018    Class: Chronic   Urinary tract infection 05/27/2018    Class: Acute   Fusion of spine of lumbar region 05/27/2018   Pain of right hip joint 07/03/2017   Acute right-sided low back pain with right-sided sciatica 03/27/2017   Chronic right shoulder pain 02/13/2017   History of rotator cuff tear 11/30/2016   Impingement syndrome of right shoulder 11/30/2016   Exacerbation of asthma 07/18/2016   Hypokalemia 07/18/2016   Hypertension 07/18/2016   Depression with anxiety 07/18/2016   Hypothyroidism 07/18/2016   Hyperglycemia 07/18/2016   Asthma, chronic 07/18/2016   Surgical wound dehiscence left hip; questionable superficial infection 11/10/2015   Postoperative wound infection 11/10/2015   Osteoarthritis of left hip 09/09/2015   Status post total replacement of left hip 09/09/2015   Obesity (  BMI 35.0-39.9 without comorbidity) 04/28/2013   Past Medical History:  Diagnosis Date   Anemia    taking iron now. pt states having no current issues 09/02/2015   Anginal pain (HCC)    pt states experiences chest wall pain pt states related to her asthma    Anxiety    with MRI's   Arthritis    Everywhere   Asthma    Depression    Dizziness    GERD (gastroesophageal reflux disease)    Headache(784.0)    HX OF MIGRAINES   History of bronchitis    Hypertension    Hypothyroidism    takes levothyroxen   OSA on  CPAP    wears cpap   Shortness of breath    with exertion   Wears glasses     Family History  Problem Relation Age of Onset   Cancer Mother        colon   Epilepsy Mother    Cancer Father        prostate   Diabetes Father    Hypertension Father    Hypertension Maternal Aunt    Diabetes Maternal Aunt    Hypertension Paternal Aunt     Past Surgical History:  Procedure Laterality Date   APPLICATION OF WOUND VAC  11/16/2021   Procedure: APPLICATION OF WOUND VAC;  Surgeon: Cammy Copa, MD;  Location: [facility] OR;  Service: Orthopedics;;   APPLICATION OF WOUND VAC Left 12/01/2021   Procedure: APPLICATION OF WOUND VAC;  Surgeon: Cammy Copa, MD;  Location: [facility] OR;  Service: Orthopedics;  Laterality: Left;   BACK SURGERY     CESAREAN SECTION     times 2   CHOLECYSTECTOMY     COLONOSCOPY  2020   ENDOMETRIAL ABLATION     I & D EXTREMITY Left 12/06/2021   Procedure: LEFT SHOULDER DEBRIDEMENT AND WOUND CLOSURE;  Surgeon: [name], MD;  Location: [facility] OR;  Service: Orthopedics;  Laterality: Left;   I & D EXTREMITY Left 12/20/2021   Procedure: LEFT SHOULDER DEBRIDEMENT;  Surgeon: [name], MD;  Location: [facility] OR;  Service: Orthopedics;  Laterality: Left;   I & D EXTREMITY Left 12/22/2021   Procedure: LEFT SHOULDER DEBRIDEMENT;  Surgeon: [name], MD;  Location: [facility] OR;  Service: Orthopedics;  Laterality: Left;   INCISION AND DRAINAGE Left 12/01/2021   Procedure: LEFT SHOULDER IRRIGATION AND EXCISIONAL DEBRIDEMENT;  Surgeon: Cammy Copa, MD;  Location: [facility] OR;  Service: Orthopedics;  Laterality: Left;   INCISION AND DRAINAGE HIP Left 11/10/2015   Procedure: IRRIGATION AND DEBRIDEMENT LEFT HIP INCISION;  Surgeon: Kathryne Hitch, MD;  Location: [facility] OR;  Service: Orthopedics;  Laterality: Left;   JOINT REPLACEMENT  2011   total left knee   KNEE ARTHROPLASTY  04/23/2012   right    KNEE ARTHROSCOPY     LUMBAR LAMINECTOMY/DECOMPRESSION MICRODISCECTOMY N/A  06/23/2019   Procedure: RIGHT LUMBAR FIVE THROUGH SACRAL ONE PARTIAL HEMILAMINECTOMY WITH RIGHT LUMBAR FIVE FORAMINOTOMY;  Surgeon: Kerrin Champagne, MD;  Location: [facility] OR;  Service: Orthopedics;  Laterality: N/A;   LUMBAR WOUND DEBRIDEMENT N/A 06/11/2018   Procedure: LUMBAR WOUND DEBRIDEMENT;  Surgeon: Kerrin Champagne, MD;  Location: [facility] OR;  Service: Orthopedics;  Laterality: N/A;   RADIOLOGY WITH ANESTHESIA N/A 09/09/2018   Procedure: LUMBER SPINE WITHOUT CONTRAST;  Surgeon: Radiologist, Medication, MD;  Location: [facility] OR;  Service: Radiology;  Laterality: N/A;   REVERSE SHOULDER ARTHROPLASTY Left 11/16/2021   Procedure: LEFT  REVERSE SHOULDER ARTHROPLASTY;  Surgeon: Cammy Copa, MD;  Location: [facility] OR;  Service: Orthopedics;  Laterality: Left;   ROTATOR CUFF REPAIR     left    SHOULDER SURGERY     right to repair ligament tear   TOTAL HIP ARTHROPLASTY Left 09/09/2015   Procedure: LEFT TOTAL HIP ARTHROPLASTY ANTERIOR APPROACH;  Surgeon: Kathryne Hitch, MD;  Location: [facility];  Service: Orthopedics;  Laterality: Left;   TOTAL KNEE ARTHROPLASTY  04/23/2012   Procedure: TOTAL KNEE ARTHROPLASTY;  Surgeon: [name], MD;  Location: [facility] OR;  Service: Orthopedics;  Laterality: Right;  Right Total Knee Arthroplasty   TUBAL LIGATION  1996   Social History   Occupational History   Not on file  Tobacco Use   Smoking status: Former    Packs/day: 0.50    Years: 4.00    Total pack years: 2.00    Types: Cigarettes    Quit date: 09/18/1987    Years since quitting: 34.7   Smokeless tobacco: Never  Vaping Use   Vaping Use: Never used  Substance and Sexual Activity   Alcohol use: No   Drug use: No   Sexual activity: Yes    Birth control/protection: Surgical

## 2022-05-25 ENCOUNTER — Ambulatory Visit (HOSPITAL_COMMUNITY): Payer: Medicare Other

## 2022-05-25 ENCOUNTER — Encounter (HOSPITAL_COMMUNITY): Payer: Self-pay

## 2022-06-04 ENCOUNTER — Encounter: Payer: Self-pay | Admitting: Physical Therapy

## 2022-06-10 NOTE — Progress Notes (Signed)
Office Visit Note   Patient: Marilyn [name]           Date of Birth: March 24, 1971           MRN: [id number] Visit Date: 03/07/2022              Requested by: Bridget Hartshorn, NP [place],  [place] [address] PCP: Bridget Hartshorn, NP   Assessment & Plan: Visit Diagnoses:  1. S/P lumbar spinal fusion   2. Pain in right hip   3. Unilateral primary osteoarthritis, right hip     Plan: For patient's hip pain I will schedule diagnostic/therapeutic intra-articular right hip injection with Dr. Ernestina Patches.  Patient will follow-up with Dr. Ninfa Linden in 2 months to see how she is feeling.  He can discuss further treatment options with her at that point.  Follow-Up Instructions: Return in about 2 months (around 05/07/2022) for WITH DR [facility] RECHECK RIGHT HIP AFTER INJECTION.   Orders:  Orders Placed This Encounter  Procedures   XR Lumbar Spine 2-3 Views   Ambulatory referral to Physical Medicine Rehab   No orders of the defined types were placed in this encounter.     Procedures: No procedures performed   Clinical Data: No additional findings.   Subjective: Chief Complaint  Patient presents with   Lower Back - Pain    HPI 51--year-old female comes in with complaints of right hip and groin pain.  Patient has had previous fusion at L4-5 and L5-S1 and also has spinal cord stimulator implanted.  Does have some back pain but is primarily complaining of right groin pain.  Patient usually sees Dr. Ninfa Linden for right hip DJD and was last seen by him November 02, 2021. Review of Systems No current complaints of cardiopulmonary GI/GU issues  Objective: Vital Signs: BP (!) 142/68   Pulse 75   Ht 5\' 3"  (1.6 m)   Wt (!) 370 lb (167.8 kg)   LMP  (LMP Unknown) Comment: tubal ligation  BMI 65.54 kg/m   Physical Exam Constitutional:      Appearance: She is obese.  Pulmonary:     Effort: No respiratory distress.  Musculoskeletal:     Comments:  Gait is slow and antalgic.  Patient has marked right groin pain with about 10 to 15 degrees internal/external rotation.  Negative straight leg raise.  No focal motor deficits.  Neurological:     General: No focal deficit present.     Mental Status: She is alert.     Ortho Exam  Specialty Comments:  No specialty comments available.  Imaging: No results found.   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 difficile colitis 05/22/2019   Hypophosphatemia 05/21/2019  Syncope 05/20/2019   OSA on CPAP    Acute gastroenteritis    Anemia due to blood loss, acute 06/18/2018    Class: Acute   Plantar fasciitis of left foot 06/16/2018    Class: Chronic   Tendonitis, Achilles, right 06/16/2018    Class: Chronic   Osteochondral talar dome lesion 06/16/2018    Class: Chronic   Deep incisional surgical site infection    Superficial incisional surgical site infection 06/11/2018    Class: Acute   Right ankle effusion 06/11/2018    Class: Acute   Morbid (severe) obesity due to excess calories (HCC) 05/29/2018    Class: Chronic   Spondylolisthesis, lumbar region 05/27/2018    Class: Chronic   Spinal stenosis of lumbar region  05/27/2018    Class: Chronic   Urinary tract infection 05/27/2018    Class: Acute   Fusion of spine of lumbar region 05/27/2018   Pain of right hip joint 07/03/2017   Acute right-sided low back pain with right-sided sciatica 03/27/2017   Chronic right shoulder pain 02/13/2017   History of rotator cuff tear 11/30/2016   Impingement syndrome of right shoulder 11/30/2016   Exacerbation of asthma 07/18/2016   Hypokalemia 07/18/2016   Hypertension 07/18/2016   Depression with anxiety 07/18/2016   Hypothyroidism 07/18/2016   Hyperglycemia 07/18/2016   Asthma, chronic 07/18/2016   Surgical wound dehiscence left hip; questionable superficial infection 11/10/2015   Postoperative wound infection 11/10/2015   Osteoarthritis of left hip 09/09/2015   Status post total replacement of left hip 09/09/2015   Obesity (BMI 35.0-39.9 without comorbidity) 04/28/2013   Past Medical History:  Diagnosis Date   Anemia    taking iron now. pt states having no current issues 09/02/2015   Anginal pain (HCC)    pt states experiences chest wall pain pt states related to her asthma    Anxiety    with MRI's   Arthritis    Everywhere   Asthma    Depression    Dizziness    GERD (gastroesophageal reflux disease)    Headache(784.0)    HX OF MIGRAINES   History of bronchitis    Hypertension    Hypothyroidism    takes levothyroxen   OSA on CPAP    wears cpap   Shortness of breath    with exertion   Wears glasses     Family History  Problem Relation Age of Onset   Cancer Mother        colon   Epilepsy Mother    Cancer Father        prostate   Diabetes Father    Hypertension Father    Hypertension Maternal Aunt    Diabetes Maternal Aunt    Hypertension Paternal Aunt     Past Surgical History:  Procedure Laterality Date   APPLICATION OF WOUND VAC  11/16/2021   Procedure: APPLICATION OF WOUND VAC;  Surgeon: Cammy Copa, MD;  Location: [facility] OR;  Service: Orthopedics;;   APPLICATION OF WOUND  VAC Left 12/01/2021   Procedure: APPLICATION OF WOUND VAC;  Surgeon: Cammy Copa, MD;  Location: [facility] OR;  Service: Orthopedics;  Laterality: Left;   BACK SURGERY     CESAREAN SECTION     times 2   CHOLECYSTECTOMY     COLONOSCOPY  2020   ENDOMETRIAL ABLATION     I & D EXTREMITY Left 12/06/2021   Procedure: LEFT SHOULDER DEBRIDEMENT AND WOUND CLOSURE;  Surgeon: [name], MD;  Location: [facility]  OR;  Service: Orthopedics;  Laterality: Left;   I & D EXTREMITY Left 12/20/2021   Procedure: LEFT SHOULDER DEBRIDEMENT;  Surgeon: [name], MD;  Location: [facility] OR;  Service: Orthopedics;  Laterality: Left;   I & D EXTREMITY Left 12/22/2021   Procedure: LEFT SHOULDER DEBRIDEMENT;  Surgeon: [name], MD;  Location: [facility] OR;  Service: Orthopedics;  Laterality: Left;   INCISION AND DRAINAGE Left 12/01/2021   Procedure: LEFT SHOULDER IRRIGATION AND EXCISIONAL DEBRIDEMENT;  Surgeon: Cammy Copa, MD;  Location: [facility] OR;  Service: Orthopedics;  Laterality: Left;   INCISION AND DRAINAGE HIP Left 11/10/2015   Procedure: IRRIGATION AND DEBRIDEMENT LEFT HIP INCISION;  Surgeon: Kathryne Hitch, MD;  Location: [facility] OR;  Service: Orthopedics;  Laterality: Left;   JOINT REPLACEMENT  2011   total left knee   KNEE ARTHROPLASTY  04/23/2012   right    KNEE ARTHROSCOPY     LUMBAR LAMINECTOMY/DECOMPRESSION MICRODISCECTOMY N/A 06/23/2019   Procedure: RIGHT LUMBAR FIVE THROUGH SACRAL ONE PARTIAL HEMILAMINECTOMY WITH RIGHT LUMBAR FIVE FORAMINOTOMY;  Surgeon: Kerrin Champagne, MD;  Location: [facility] OR;  Service: Orthopedics;  Laterality: N/A;   LUMBAR WOUND DEBRIDEMENT N/A 06/11/2018   Procedure: LUMBAR WOUND DEBRIDEMENT;  Surgeon: Kerrin Champagne, MD;  Location: [facility] OR;  Service: Orthopedics;  Laterality: N/A;   RADIOLOGY WITH ANESTHESIA N/A 09/09/2018   Procedure: LUMBER SPINE WITHOUT CONTRAST;  Surgeon: Radiologist, Medication, MD;  Location: [facility] OR;  Service: Radiology;  Laterality: N/A;   REVERSE SHOULDER  ARTHROPLASTY Left 11/16/2021   Procedure: LEFT REVERSE SHOULDER ARTHROPLASTY;  Surgeon: Cammy Copa, MD;  Location: [facility] OR;  Service: Orthopedics;  Laterality: Left;   ROTATOR CUFF REPAIR     left    SHOULDER SURGERY     right to repair ligament tear   TOTAL HIP ARTHROPLASTY Left 09/09/2015   Procedure: LEFT TOTAL HIP ARTHROPLASTY ANTERIOR APPROACH;  Surgeon: Kathryne Hitch, MD;  Location: [facility];  Service: Orthopedics;  Laterality: Left;   TOTAL KNEE ARTHROPLASTY  04/23/2012   Procedure: TOTAL KNEE ARTHROPLASTY;  Surgeon: [name], MD;  Location: [facility] OR;  Service: Orthopedics;  Laterality: Right;  Right Total Knee Arthroplasty   TUBAL LIGATION  1996   Social History   Occupational History   Not on file  Tobacco Use   Smoking status: Former    Packs/day: 0.50    Years: 4.00    Total pack years: 2.00    Types: Cigarettes    Quit date: 09/18/1987    Years since quitting: 34.7   Smokeless tobacco: Never  Vaping Use   Vaping Use: Never used  Substance and Sexual Activity   Alcohol use: No   Drug use: No   Sexual activity: Yes    Birth control/protection: Surgical

## 2022-06-18 ENCOUNTER — Telehealth: Payer: Self-pay | Admitting: Orthopedic Surgery

## 2022-06-18 NOTE — Telephone Encounter (Signed)
Pt called about progress report to [facility] in [place]. Please call pt about this matter at [phone]. [employer] fax number is [phone].

## 2022-06-19 ENCOUNTER — Telehealth: Payer: Self-pay | Admitting: Orthopedic Surgery

## 2022-06-19 NOTE — Telephone Encounter (Signed)
I left voicemail requesting return call. 

## 2022-06-19 NOTE — Telephone Encounter (Signed)
Tried calling patient to discuss. No answer. LMVM to call back to further discuss. Additional information needed.

## 2022-06-19 NOTE — Telephone Encounter (Signed)
Pt returned call to Lauren F. Please call pt at [phone].

## 2022-06-19 NOTE — Telephone Encounter (Signed)
Patient returned call asked for a call back. The number to contact patient is [phone]

## 2022-06-20 ENCOUNTER — Other Ambulatory Visit: Payer: Self-pay

## 2022-06-20 DIAGNOSIS — M25511 Pain in right shoulder: Secondary | ICD-10-CM

## 2022-06-21 ENCOUNTER — Telehealth: Payer: Self-pay | Admitting: Orthopedic Surgery

## 2022-06-21 ENCOUNTER — Telehealth: Payer: Self-pay

## 2022-06-21 NOTE — Telephone Encounter (Signed)
Patient called. She would like to speak with Lauren. Her call back number is [phone]

## 2022-06-21 NOTE — Telephone Encounter (Signed)
Refaxed orders for TENS to [facility] to [phone] per patients request

## 2022-06-21 NOTE — Telephone Encounter (Signed)
Received call from pt.stating Marilyn [name] hasn't received order for Tens unit. Needs to be faxed to [phone]- clinical assistant advised and was refaxed to correct number. Patient advised of this and will follow-up with [facility].

## 2022-06-21 NOTE — Telephone Encounter (Signed)
Tammy talked with patient who called again and advised [facility] in [place] did not have orders that were faxed yesterday.  I resubmitted for patient. Tammy advised her.

## 2022-06-29 ENCOUNTER — Encounter: Payer: Self-pay | Admitting: Specialist

## 2022-06-29 ENCOUNTER — Ambulatory Visit (INDEPENDENT_AMBULATORY_CARE_PROVIDER_SITE_OTHER): Payer: Medicare Other | Admitting: Specialist

## 2022-06-29 ENCOUNTER — Ambulatory Visit: Payer: Self-pay

## 2022-06-29 VITALS — BP 140/62 | HR 75 | Ht 63.0 in | Wt 369.9 lb

## 2022-06-29 DIAGNOSIS — M5441 Lumbago with sciatica, right side: Secondary | ICD-10-CM

## 2022-06-29 DIAGNOSIS — M5136 Other intervertebral disc degeneration, lumbar region: Secondary | ICD-10-CM

## 2022-06-29 DIAGNOSIS — N182 Chronic kidney disease, stage 2 (mild): Secondary | ICD-10-CM

## 2022-06-29 DIAGNOSIS — Z6841 Body Mass Index (BMI) 40.0 and over, adult: Secondary | ICD-10-CM

## 2022-06-29 DIAGNOSIS — G8929 Other chronic pain: Secondary | ICD-10-CM

## 2022-06-29 DIAGNOSIS — M4316 Spondylolisthesis, lumbar region: Secondary | ICD-10-CM | POA: Diagnosis not present

## 2022-06-29 MED ORDER — PREGABALIN 75 MG PO CAPS: 75.0000 mg | ORAL_CAPSULE | Freq: Two times a day (BID) | ORAL | 0 refills | Status: DC

## 2022-06-29 NOTE — Patient Instructions (Addendum)
Avoid frequent bending and stooping  No lifting greater than 10 lbs. May use ice or moist heat for pain. Weight loss is of benefit. Best medication for lumbar disc disease is arthritis medications but due to decreased kidney function on your last blood test in 12/2021 you should not take like motrin, celebrex and naprosyn. Exercise is important to improve your indurance and does allow people to function better inspite of back pain. Recommend PT for dynamic stabilization of the spine as a conservative way to deal with the disc degeneration at the level above your fusion site.  If no improvement with PT then imaging with MRI may be ordered to help determine if Surgery would be necessary. Gabapentin is helpful to decrease your nerve pain, you do not feel it is helping so I will prescribe pregablin that is also helpful for relieving nerve pain and chronic pain.   Injection with steroid may be of benefit. Hemp CBD capsules, amazon.com 5,000-7,000 mg per bottle, 60 capsules per bottle, take one capsule twice a day. Cane in the left hand to use with left leg weight bearing. Follow-Up Instructions: No follow-ups on file.

## 2022-06-29 NOTE — Progress Notes (Signed)
Office Visit Note   Patient: Marilyn [name]           Date of Birth: Aug 10, 1971           MRN: [id number] Visit Date: 06/29/2022              Requested by: Rebecka Apley, NP [address] [place],  [place] [address] PCP: Rebecka Apley, NP   Assessment & Plan: Visit Diagnoses:  1. Chronic right-sided low back pain with right-sided sciatica   2. Spondylolisthesis, lumbar region   3. BMI 60.0-69.9, adult (HCC)     Plan:Avoid frequent bending and stooping  No lifting greater than 10 lbs. May use ice or moist heat for pain. Weight loss is of benefit. Best medication for lumbar disc disease is arthritis medications but due to decreased kidney function on your last blood test in 12/2021 you should not take like motrin, celebrex and naprosyn. Exercise is important to improve your indurance and does allow people to function better inspite of back pain. Recommend PT for dynamic stabilization of the spine as a conservative way to deal with the disc degeneration at the level above your fusion site.  If no improvement with PT then imaging with MRI may be ordered to help determine if Surgery would be necessary. Gabapentin is helpful to decrease your nerve pain, you do not feel it is helping so I will prescribe pregablin that is also helpful for relieving nerve pain and chronic pain.   Injection with steroid may be of benefit. Hemp CBD capsules, amazon.com 5,000-7,000 mg per bottle, 60 capsules per bottle, take one capsule twice a day. Cane in the left hand to use with left leg weight bearing. Follow-Up Instructions: No follow-ups on file.    Follow-Up Instructions: Return in about 4 weeks (around 07/27/2022).   Orders:  Orders Placed This Encounter  Procedures   XR Lumbar Spine 2-3 Views   No orders of the defined types were placed in this encounter.     Procedures: No procedures performed   Clinical Data: No additional  findings.   Subjective: Chief Complaint  Patient presents with   Lower Back - Pain    51 year old female with history of lumbar hemilaminectomy L5-S1 for lateral recess stenosis 06/2019. In the interval she has had surgical treatment of  Left shoulder with a reverse shoulder replacement complicated with infection. She reports pain in her lower back and into the right leg with walking it increases and into the right buttock and sometimes into the right lateral posterior buttock. There is pain with coughing and sneezing. She has to sleep in a recliner chair and reports it is a struggle to get up out of a chair and is trying to acquire a lift chair. She has to climb stairs one at a time. She has no diffence in bowel or bladder. Washing dishes she has to lean to decrease pressure in her back and use a standing chair to do cooking. There is tingling all the time in the right leg not sure this is due to nerve or the nerve. She has a spinal cord stimulator and this has been in place for about.    Review of Systems  Constitutional: Negative.   HENT: Negative.    Eyes: Negative.   Respiratory: Negative.    Cardiovascular: Negative.   Gastrointestinal: Negative.   Endocrine: Negative.   Genitourinary: Negative.   Musculoskeletal: Negative.   Skin: Negative.   Allergic/Immunologic:  Negative.   Neurological: Negative.   Hematological: Negative.   Psychiatric/Behavioral: Negative.       Objective: Vital Signs: BP (!) 140/62   Pulse 75   Ht 5\' 3"  (1.6 m)   Wt (!) 369 lb 14.9 oz (167.8 kg)   LMP  (LMP Unknown) Comment: tubal ligation  BMI 65.53 kg/m   Physical Exam Constitutional:      Appearance: She is well-developed.  HENT:     Head: Normocephalic and atraumatic.  Eyes:     Pupils: Pupils are equal, round, and reactive to light.  Pulmonary:     Effort: Pulmonary effort is normal.     Breath sounds: Normal breath sounds.  Abdominal:     General: Bowel sounds are normal.      Palpations: Abdomen is soft.  Musculoskeletal:        General: Normal range of motion.     Cervical back: Normal range of motion and neck supple.  Skin:    General: Skin is warm and dry.  Neurological:     Mental Status: She is alert and oriented to person, place, and time.  Psychiatric:        Behavior: Behavior normal.        Thought Content: Thought content normal.        Judgment: Judgment normal.     Ortho Exam  Specialty Comments:  No specialty comments available.  Imaging: No results found.   PMFS History: Patient Active Problem List   Diagnosis Date Noted   Anemia due to blood loss, acute 06/18/2018    Priority: High    Class: Acute   Superficial incisional surgical site infection 06/11/2018    Priority: High    Class: Acute   Right ankle effusion 06/11/2018    Priority: High    Class: Acute   Morbid (severe) obesity due to excess calories ([place]) 05/29/2018    Priority: High    Class: Chronic   Spondylolisthesis, lumbar region 05/27/2018    Priority: High    Class: Chronic   Spinal stenosis of lumbar region 05/27/2018    Priority: High    Class: Chronic   Urinary tract infection 05/27/2018    Priority: High    Class: Acute   Plantar fasciitis of left foot 06/16/2018    Priority: Medium     Class: Chronic   Tendonitis, Achilles, right 06/16/2018    Priority: Medium     Class: Chronic   Osteochondral talar dome lesion 06/16/2018    Priority: Medium     Class: Chronic   Lactic acidosis 12/18/2021   Dyspnea on exertion 12/18/2021   Anemia 12/18/2021   Gout 12/18/2021   Acute pain of left shoulder    Arthritis of left shoulder region    S/P reverse total shoulder arthroplasty, left 11/16/2021   Unilateral primary osteoarthritis, right hip 05/09/2021   S/P lumbar spinal fusion 05/09/2021   S/P insertion of spinal cord stimulator 05/09/2021   H/O total knee replacement, bilateral 01/18/2021   Lumbar post-laminectomy syndrome 04/06/2020   Long-term  current use of opiate analgesic 11/04/2019   Leg pain, right 10/20/2019   Dysesthesia 10/20/2019   Lateral femoral cutaneous neuropathy, 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 difficile colitis 05/22/2019   Hypophosphatemia 05/21/2019   Syncope 05/20/2019   OSA on CPAP    Acute gastroenteritis    Deep incisional surgical site infection    Fusion  of spine of lumbar region 05/27/2018   Pain of right hip joint 07/03/2017   Acute right-sided low back pain with right-sided sciatica 03/27/2017   Chronic right shoulder pain 02/13/2017   History of rotator cuff tear 11/30/2016   Impingement syndrome of right shoulder 11/30/2016   Exacerbation of asthma 07/18/2016   Hypokalemia 07/18/2016   Hypertension 07/18/2016   Depression with anxiety 07/18/2016   Hypothyroidism 07/18/2016   Hyperglycemia 07/18/2016   Asthma, chronic 07/18/2016   Surgical wound dehiscence left hip; questionable superficial infection 11/10/2015   Postoperative wound infection 11/10/2015   Osteoarthritis of left hip 09/09/2015   Status post total replacement of left hip 09/09/2015   Obesity (BMI 35.0-39.9 without comorbidity) 04/28/2013   Past Medical History:  Diagnosis Date   Anemia    taking iron now. pt states having no current issues 09/02/2015   Anginal pain (HCC)    pt states experiences chest wall pain pt states related to her asthma    Anxiety    with MRI's   Arthritis    Everywhere   Asthma    Depression    Dizziness    GERD (gastroesophageal reflux disease)    Headache(784.0)    HX OF MIGRAINES   History of bronchitis    Hypertension    Hypothyroidism    takes levothyroxen   OSA on CPAP    wears cpap   Shortness of breath    with exertion   Wears glasses     Family History  Problem Relation Age of Onset   Cancer Mother        colon   Epilepsy Mother    Cancer Father        prostate   Diabetes Father     Hypertension Father    Hypertension Maternal Aunt    Diabetes Maternal Aunt    Hypertension Paternal Aunt     Past Surgical History:  Procedure Laterality Date   APPLICATION OF WOUND VAC  11/16/2021   Procedure: APPLICATION OF WOUND VAC;  Surgeon: Cammy Copa, MD;  Location: [facility] OR;  Service: Orthopedics;;   APPLICATION OF WOUND VAC Left 12/01/2021   Procedure: APPLICATION OF WOUND VAC;  Surgeon: Cammy Copa, MD;  Location: [facility] OR;  Service: Orthopedics;  Laterality: Left;   BACK SURGERY     CESAREAN SECTION     times 2   CHOLECYSTECTOMY     COLONOSCOPY  2020   ENDOMETRIAL ABLATION     I & D EXTREMITY Left 12/06/2021   Procedure: LEFT SHOULDER DEBRIDEMENT AND WOUND CLOSURE;  Surgeon: [name], MD;  Location: [facility] OR;  Service: Orthopedics;  Laterality: Left;   I & D EXTREMITY Left 12/20/2021   Procedure: LEFT SHOULDER DEBRIDEMENT;  Surgeon: [name], MD;  Location: [facility] OR;  Service: Orthopedics;  Laterality: Left;   I & D EXTREMITY Left 12/22/2021   Procedure: LEFT SHOULDER DEBRIDEMENT;  Surgeon: [name], MD;  Location: [facility] OR;  Service: Orthopedics;  Laterality: Left;   INCISION AND DRAINAGE Left 12/01/2021   Procedure: LEFT SHOULDER IRRIGATION AND EXCISIONAL DEBRIDEMENT;  Surgeon: Cammy Copa, MD;  Location: [facility] OR;  Service: Orthopedics;  Laterality: Left;   INCISION AND DRAINAGE HIP Left 11/10/2015   Procedure: IRRIGATION AND DEBRIDEMENT LEFT HIP INCISION;  Surgeon: Kathryne Hitch, MD;  Location: [facility] OR;  Service: Orthopedics;  Laterality: Left;   JOINT REPLACEMENT  2011   total left knee   KNEE ARTHROPLASTY  04/23/2012  right    KNEE ARTHROSCOPY     LUMBAR LAMINECTOMY/DECOMPRESSION MICRODISCECTOMY N/A 06/23/2019   Procedure: RIGHT LUMBAR FIVE THROUGH SACRAL ONE PARTIAL HEMILAMINECTOMY WITH RIGHT LUMBAR FIVE FORAMINOTOMY;  Surgeon: Kerrin Champagne, MD;  Location: [facility] OR;  Service: Orthopedics;  Laterality: N/A;   LUMBAR WOUND DEBRIDEMENT N/A  06/11/2018   Procedure: LUMBAR WOUND DEBRIDEMENT;  Surgeon: Kerrin Champagne, MD;  Location: [facility] OR;  Service: Orthopedics;  Laterality: N/A;   RADIOLOGY WITH ANESTHESIA N/A 09/09/2018   Procedure: LUMBER SPINE WITHOUT CONTRAST;  Surgeon: Radiologist, Medication, MD;  Location: [facility] OR;  Service: Radiology;  Laterality: N/A;   REVERSE SHOULDER ARTHROPLASTY Left 11/16/2021   Procedure: LEFT REVERSE SHOULDER ARTHROPLASTY;  Surgeon: Cammy Copa, MD;  Location: [facility] OR;  Service: Orthopedics;  Laterality: Left;   ROTATOR CUFF REPAIR     left    SHOULDER SURGERY     right to repair ligament tear   TOTAL HIP ARTHROPLASTY Left 09/09/2015   Procedure: LEFT TOTAL HIP ARTHROPLASTY ANTERIOR APPROACH;  Surgeon: Kathryne Hitch, MD;  Location: [facility] ORS;  Service: Orthopedics;  Laterality: Left;   TOTAL KNEE ARTHROPLASTY  04/23/2012   Procedure: TOTAL KNEE ARTHROPLASTY;  Surgeon: [name], MD;  Location: [facility] OR;  Service: Orthopedics;  Laterality: Right;  Right Total Knee Arthroplasty   TUBAL LIGATION  1996   Social History   Occupational History   Not on file  Tobacco Use   Smoking status: Former    Packs/day: 0.50    Years: 4.00    Total pack years: 2.00    Types: Cigarettes    Quit date: 09/18/1987    Years since quitting: 34.8   Smokeless tobacco: Never  Vaping Use   Vaping Use: Never used  Substance and Sexual Activity   Alcohol use: No   Drug use: No   Sexual activity: Yes    Birth control/protection: Surgical

## 2022-06-29 NOTE — Addendum Note (Signed)
Addended by: Basil Dess on: 06/29/2022 12:04 PM   Modules accepted: Orders

## 2022-07-06 ENCOUNTER — Emergency Department (HOSPITAL_BASED_OUTPATIENT_CLINIC_OR_DEPARTMENT_OTHER)
Admission: EM | Admit: 2022-07-06 | Discharge: 2022-07-06 | Disposition: A | Discharge status: Home / Self Care | Payer: Medicare Other | Attending: Emergency Medicine | Admitting: Emergency Medicine

## 2022-07-06 ENCOUNTER — Other Ambulatory Visit: Payer: Self-pay

## 2022-07-06 ENCOUNTER — Emergency Department (HOSPITAL_COMMUNITY)
Admission: EM | Admit: 2022-07-06 | Discharge: 2022-07-06 | Discharge status: Against Medical Advice | Payer: Medicare Other

## 2022-07-06 DIAGNOSIS — M545 Low back pain, unspecified: Secondary | ICD-10-CM | POA: Diagnosis present

## 2022-07-06 DIAGNOSIS — Z91040 Latex allergy status: Secondary | ICD-10-CM | POA: Insufficient documentation

## 2022-07-06 DIAGNOSIS — G8929 Other chronic pain: Secondary | ICD-10-CM | POA: Insufficient documentation

## 2022-07-06 DIAGNOSIS — N189 Chronic kidney disease, unspecified: Secondary | ICD-10-CM | POA: Diagnosis not present

## 2022-07-06 DIAGNOSIS — M5441 Lumbago with sciatica, right side: Secondary | ICD-10-CM | POA: Insufficient documentation

## 2022-07-06 MED ORDER — OXYCODONE-ACETAMINOPHEN 5-325 MG PO TABS: 1.0000 | ORAL_TABLET | Freq: Four times a day (QID) | ORAL | 0 refills | Status: DC | PRN

## 2022-07-06 MED ORDER — HYDROMORPHONE HCL 1 MG/ML IJ SOLN: 2.0000 mg | Freq: Once | INTRAMUSCULAR | Status: DC

## 2022-07-06 MED ORDER — PREDNISONE 50 MG PO TABS
60.0000 mg | ORAL_TABLET | Freq: Once | ORAL | Status: AC
  Administered 2022-07-06: 60 mg via ORAL
  Filled 2022-07-06: qty 1

## 2022-07-06 MED ORDER — PREDNISONE 10 MG (21) PO TBPK: ORAL_TABLET | ORAL | 0 refills | Status: DC

## 2022-07-06 MED ORDER — HYDROMORPHONE HCL 1 MG/ML IJ SOLN
2.0000 mg | Freq: Once | INTRAMUSCULAR | Status: AC
  Administered 2022-07-06: 2 mg via INTRAMUSCULAR
  Filled 2022-07-06: qty 2

## 2022-07-06 NOTE — ED Triage Notes (Addendum)
Pt seen by orthopedist for back pain. Pt states that she now has "sliding vertebrae" above where she had surgery 2.5 years ago. Pt has spinal stimulator but isn't helping pain. Films done by ortho.

## 2022-07-06 NOTE — ED Provider Notes (Signed)
[facility]  Provider Note  CSN: [id number] Arrival date & time: 07/06/22 2536  History Chief Complaint  Patient presents with   Back Pain    Marilyn [name] is a 51 y.o. female with history of chronic low back pain, s/p fusion surgery and spine stimulator, saw Dr. Louanne Skye for increased pain about a week ago and had Gabapentin changed to pregabalin without improvement. She has mild CKD so avoids NSAIDs, planning to try PT and Pain Management but has not seen them yet. She reports increasing, diffuse back pain, worse in the R lower and radiating down R leg. No weakness or incontinence. No fevers. No other red flags.    Home Medications Prior to Admission medications   Medication Sig Start Date End Date Taking? Authorizing Provider  oxyCODONE-acetaminophen (PERCOCET/ROXICET) 5-325 MG tablet Take 1 tablet by mouth every 6 (six) hours as needed for severe pain. 07/06/22  Yes Truddie Hidden, MD  predniSONE (STERAPRED UNI-PAK 21 TAB) 10 MG (21) TBPK tablet 10mg  Tabs, 6 day taper. Use as directed 07/06/22  Yes Truddie Hidden, MD  albuterol (VENTOLIN HFA) 108 (90 Base) MCG/ACT inhaler Inhale 1-2 puffs into the lungs every 6 (six) hours as needed for wheezing or shortness of breath. 12/09/19   Henderly, Britni A, PA-C  allopurinol (ZYLOPRIM) 100 MG tablet Take 1 tablet (100 mg total) by mouth 2 (two) times daily. 02/13/22 05/14/22  Jessy Oto, MD  ammonium lactate (LAC-HYDRIN) 12 % lotion Apply 1 application. topically 2 (two) times daily as needed for dry skin. 07/17/21   [provider]  ascorbic Acid (VITAMIN C) 500 MG CPCR Take 500 mg by mouth daily. Gummy    [provider]  buPROPion (WELLBUTRIN XL) 300 MG 24 hr tablet Take 300 mg by mouth daily. 06/29/20   [provider]  cetirizine (ZYRTEC) 10 MG tablet Take 10 mg by mouth daily.    [provider]  cycloSPORINE (RESTASIS) 0.05 % ophthalmic emulsion Place 1 drop  into both eyes 2 (two) times daily as needed (dry eyes).    [provider]  DULoxetine (CYMBALTA) 60 MG capsule Take 60 mg by mouth 2 (two) times daily. 05/02/21   [provider]  Ferrous Gluconate 324 (37.5 Fe) MG TABS Take 324 mg by mouth daily. 02/14/20   [provider]  furosemide (LASIX) 20 MG tablet Take 20 mg by mouth daily.    [provider]  hydrochlorothiazide (HYDRODIURIL) 25 MG tablet Take 25 mg by mouth daily. 09/15/21   [provider]  levothyroxine (SYNTHROID) 150 MCG tablet Take 150 mcg by mouth daily before breakfast. 05/18/19   [provider]  oxybutynin (DITROPAN XL) 15 MG 24 hr tablet Take 15 mg by mouth daily.  05/31/17   [provider]  pregabalin (LYRICA) 75 MG capsule Take 1 capsule (75 mg total) by mouth 2 (two) times daily. 06/29/22   Jessy Oto, MD  vitamin B-12 (CYANOCOBALAMIN) 250 MCG tablet Take 250 mcg by mouth daily.    [provider]  Vitamin D, Ergocalciferol, (DRISDOL) 1.25 MG (50000 UNIT) CAPS capsule Take 50,000 Units by mouth every Monday.    [provider]     Allergies    Lisinopril, Bee venom, Bupropion, Latex, Propoxyphene, Methadone, Codeine, Meloxicam, Morphine and related, Tegaderm ag mesh [silver], and Tomato   Review of Systems   Review of Systems Please see HPI for pertinent positives and negatives  Physical Exam BP (!) 143/74  Pulse 61   Temp 98.2 F (36.8 C) (Oral)   Resp 19   Ht 5\' 3"  (1.6 m)   Wt (!) 167.4 kg   LMP  (LMP Unknown) Comment: tubal ligation  SpO2 100%   BMI 65.37 kg/m   Physical Exam Vitals and nursing note reviewed.  HENT:     Head: Normocephalic.     Nose: Nose normal.  Eyes:     Extraocular Movements: Extraocular movements intact.  Pulmonary:     Effort: Pulmonary effort is normal.  Musculoskeletal:        General: Tenderness (diffuse soft tissue tenderness of entire back, worse in R lumbar paraspinal area.) present.  Normal range of motion.     Cervical back: Neck supple.  Skin:    Findings: No rash (on exposed skin).  Neurological:     Mental Status: She is alert and oriented to person, place, and time.     Motor: No weakness.  Psychiatric:        Mood and Affect: Mood normal.     ED Results / Procedures / Treatments   EKG None  Procedures Procedures  Medications Ordered in the ED Medications  HYDROmorphone (DILAUDID) injection 2 mg (has no administration in time range)  predniSONE (DELTASONE) tablet 60 mg (has no administration in time range)    Initial Impression and Plan  Patient with exacerbation of chronic low back pain, no red flags. Will give IM pain meds, start a course of steroids. Short course of outpatient percocet. Recommend outpatient Ortho/Pain follow up.   ED Course       MDM Rules/Calculators/A&P Medical Decision Making Problems Addressed: Chronic right-sided low back pain with right-sided sciatica: chronic illness or injury with exacerbation, progression, or side effects of treatment  Risk Prescription drug management. Parenteral controlled substances.    Final Clinical Impression(s) / ED Diagnoses Final diagnoses:  Chronic right-sided low back pain with right-sided sciatica    Rx / DC Orders ED Discharge Orders          Ordered    predniSONE (STERAPRED UNI-PAK 21 TAB) 10 MG (21) TBPK tablet        07/06/22 2315    oxyCODONE-acetaminophen (PERCOCET/ROXICET) 5-325 MG tablet  Every 6 hours PRN        07/06/22 2315             07/08/22, MD 07/06/22 2315

## 2022-07-06 NOTE — ED Notes (Signed)
This patient was called for triage with no answer

## 2022-07-09 ENCOUNTER — Ambulatory Visit (INDEPENDENT_AMBULATORY_CARE_PROVIDER_SITE_OTHER): Payer: Medicare Other | Admitting: Orthopedic Surgery

## 2022-07-09 ENCOUNTER — Encounter: Payer: Self-pay | Admitting: Orthopedic Surgery

## 2022-07-09 ENCOUNTER — Ambulatory Visit: Payer: Medicare Other

## 2022-07-09 VITALS — Ht 63.0 in | Wt 369.0 lb

## 2022-07-09 DIAGNOSIS — Z96612 Presence of left artificial shoulder joint: Secondary | ICD-10-CM

## 2022-07-11 ENCOUNTER — Ambulatory Visit: Payer: Medicare Other | Admitting: Physical Therapy

## 2022-07-12 ENCOUNTER — Encounter: Payer: Self-pay | Admitting: Orthopedic Surgery

## 2022-07-12 ENCOUNTER — Ambulatory Visit (INDEPENDENT_AMBULATORY_CARE_PROVIDER_SITE_OTHER): Payer: Medicare Other | Admitting: Surgery

## 2022-07-12 ENCOUNTER — Encounter: Payer: Self-pay | Admitting: Surgery

## 2022-07-12 VITALS — BP 166/84 | HR 71 | Ht 63.0 in | Wt 369.0 lb

## 2022-07-12 DIAGNOSIS — M533 Sacrococcygeal disorders, not elsewhere classified: Secondary | ICD-10-CM | POA: Diagnosis not present

## 2022-07-12 DIAGNOSIS — G8929 Other chronic pain: Secondary | ICD-10-CM

## 2022-07-12 DIAGNOSIS — Z981 Arthrodesis status: Secondary | ICD-10-CM

## 2022-07-12 NOTE — Progress Notes (Signed)
Office Visit Note   Patient: Marilyn [name]           Date of Birth: 1970/12/11           MRN: [id number] Visit Date: 07/12/2022              Requested by: Bridget Hartshorn, NP [place],  [place] [id number] PCP: Bridget Hartshorn, NP   Assessment & Plan: Visit Diagnoses:  1. S/P lumbar spinal fusion   2. Chronic right SI joint pain   3. Chronic left SI joint pain     Plan: We will schedule appointment with Dr. Ernestina Patches for bilateral diagnostic/therapeutic SI joint injections.  I was planning on getting patient into see Dr. Rolena Infante for ultrasound-guided but he thought it would be better under fluoroscopy.  Patient follow-up with Dr. Lorin Mercy about 3 weeks after she has had the injections.  Dr. Louanne Skye previously did surgery with lumbar fusion but he is retiring in a few days.  Follow-Up Instructions: Return if symptoms worsen or fail to improve.   Orders:  Orders Placed This Encounter  Procedures   Ambulatory referral to Physical Medicine Rehab   No orders of the defined types were placed in this encounter.     Procedures: No procedures performed   Clinical Data: No additional findings.   Subjective: Chief Complaint  Patient presents with   Lower Back - Pain    HPI 51 year old black female comes in with complaints of low back pain.  Status postTRANSFORAMINAL LUMBAR INTERBODY FUSION LEFT L4-5 AND L5-S1 WITH DEPUY MPACT SCREWS AND RODS, CONCORDE PROTI CAGES, LOCAL AND ALLOGRAFT BONE GRAFT, VIVIGEN by Dr. Louanne Skye May 27, 2018.  Patient is also status post spinal cord stimulator implant by Dr. Wylene Men with [facility] August 02, 2020.  Patient last seen by Dr. Louanne Skye couple weeks ago.  She continues to have ongoing pain in her low back.  Localizes most of her pain to around the bilateral SI joints.  She was seen in the ED July 06, 2022 and prescribed prednisone taper and took the last dose today.  She states that the spinal cord  stimulator is working well and does help with her leg pain. Review of Systems No current complaints of cardiopulmonary GI/GU issues  Objective: Vital Signs: BP (!) 166/84   Pulse 71   Ht 5\' 3"  (1.6 m)   Wt (!) 369 lb (167.4 kg)   LMP  (LMP Unknown) Comment: tubal ligation  BMI 65.37 kg/m   Physical Exam Constitutional:      Appearance: She is obese.  HENT:     Head: Normocephalic and atraumatic.     Nose: Nose normal.  Eyes:     Extraocular Movements: Extraocular movements intact.  Pulmonary:     Effort: No respiratory distress.  Musculoskeletal:     Comments: Gait is somewhat antalgic.  Patient has marked tenderness over the bilateral SI joints.  Neurological:     Mental Status: She is oriented to person, place, and time.  Psychiatric:        Mood and Affect: Mood normal.     Ortho Exam  Specialty Comments:  No specialty comments available.  Imaging: No results found.   PMFS History: Patient Active Problem List   Diagnosis Date Noted   Lactic acidosis 12/18/2021   Dyspnea on exertion 12/18/2021   Anemia 12/18/2021   Gout 12/18/2021   Acute pain of left shoulder    Arthritis of left shoulder  region    S/P reverse total shoulder arthroplasty, left 11/16/2021   Unilateral primary osteoarthritis, right hip 05/09/2021   S/P lumbar spinal fusion 05/09/2021   S/P insertion of spinal cord stimulator 05/09/2021   H/O total knee replacement, bilateral 01/18/2021   Lumbar post-laminectomy syndrome 04/06/2020   Long-term current use of opiate analgesic 11/04/2019   Leg pain, right 10/20/2019   Dysesthesia 10/20/2019   Lateral femoral cutaneous neuropathy, 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 difficile colitis 05/22/2019   Hypophosphatemia 05/21/2019   Syncope 05/20/2019   OSA on CPAP    Acute gastroenteritis    Anemia due to blood loss, acute 06/18/2018    Class: Acute    Plantar fasciitis of left foot 06/16/2018    Class: Chronic   Tendonitis, Achilles, right 06/16/2018    Class: Chronic   Osteochondral talar dome lesion 06/16/2018    Class: Chronic   Deep incisional surgical site infection    Superficial incisional surgical site infection 06/11/2018    Class: Acute   Right ankle effusion 06/11/2018    Class: Acute   Morbid (severe) obesity due to excess calories (HCC) 05/29/2018    Class: Chronic   Spondylolisthesis, lumbar region 05/27/2018    Class: Chronic   Spinal stenosis of lumbar region 05/27/2018    Class: Chronic   Urinary tract infection 05/27/2018    Class: Acute   Fusion of spine of lumbar region 05/27/2018   Pain of right hip joint 07/03/2017   Acute right-sided low back pain with right-sided sciatica 03/27/2017   Chronic right shoulder pain 02/13/2017   History of rotator cuff tear 11/30/2016   Impingement syndrome of right shoulder 11/30/2016   Exacerbation of asthma 07/18/2016   Hypokalemia 07/18/2016   Hypertension 07/18/2016   Depression with anxiety 07/18/2016   Hypothyroidism 07/18/2016   Hyperglycemia 07/18/2016   Asthma, chronic 07/18/2016   Surgical wound dehiscence left hip; questionable superficial infection 11/10/2015   Postoperative wound infection 11/10/2015   Osteoarthritis of left hip 09/09/2015   Status post total replacement of left hip 09/09/2015   Obesity (BMI 35.0-39.9 without comorbidity) 04/28/2013   Past Medical History:  Diagnosis Date   Anemia    taking iron now. pt states having no current issues 09/02/2015   Anginal pain (HCC)    pt states experiences chest wall pain pt states related to her asthma    Anxiety    with MRI's   Arthritis    Everywhere   Asthma    Depression    Dizziness    GERD (gastroesophageal reflux disease)    Headache(784.0)    HX OF MIGRAINES   History of bronchitis    Hypertension    Hypothyroidism    takes levothyroxen   OSA on CPAP    wears cpap   Shortness  of breath    with exertion   Wears glasses     Family History  Problem Relation Age of Onset   Cancer Mother        colon   Epilepsy Mother    Cancer Father        prostate   Diabetes Father    Hypertension Father    Hypertension Maternal Aunt    Diabetes Maternal Aunt    Hypertension Paternal Aunt     Past Surgical History:  Procedure Laterality Date   APPLICATION OF WOUND VAC  11/16/2021   Procedure: APPLICATION OF  WOUND VAC;  Surgeon: Meredith Pel, MD;  Location: [place];  Service: Orthopedics;;   APPLICATION OF WOUND VAC Left 12/01/2021   Procedure: APPLICATION OF WOUND VAC;  Surgeon: Meredith Pel, MD;  Location: [place];  Service: Orthopedics;  Laterality: Left;   BACK SURGERY     CESAREAN SECTION     times 2   CHOLECYSTECTOMY     COLONOSCOPY  2020   ENDOMETRIAL ABLATION     I & D EXTREMITY Left 12/06/2021   Procedure: LEFT SHOULDER DEBRIDEMENT AND WOUND CLOSURE;  Surgeon: Newt Minion, MD;  Location: [place];  Service: Orthopedics;  Laterality: Left;   I & D EXTREMITY Left 12/20/2021   Procedure: LEFT SHOULDER DEBRIDEMENT;  Surgeon: Newt Minion, MD;  Location: [place];  Service: Orthopedics;  Laterality: Left;   I & D EXTREMITY Left 12/22/2021   Procedure: LEFT SHOULDER DEBRIDEMENT;  Surgeon: Newt Minion, MD;  Location: [place];  Service: Orthopedics;  Laterality: Left;   INCISION AND DRAINAGE Left 12/01/2021   Procedure: LEFT SHOULDER IRRIGATION AND EXCISIONAL DEBRIDEMENT;  Surgeon: Meredith Pel, MD;  Location: [place];  Service: Orthopedics;  Laterality: Left;   INCISION AND DRAINAGE HIP Left 11/10/2015   Procedure: IRRIGATION AND DEBRIDEMENT LEFT HIP INCISION;  Surgeon: Mcarthur Rossetti, MD;  Location: [place];  Service: Orthopedics;  Laterality: Left;   JOINT REPLACEMENT  2011   total left knee   KNEE ARTHROPLASTY  04/23/2012   right    KNEE ARTHROSCOPY     LUMBAR LAMINECTOMY/DECOMPRESSION MICRODISCECTOMY N/A 06/23/2019   Procedure: RIGHT LUMBAR FIVE  THROUGH SACRAL ONE PARTIAL HEMILAMINECTOMY WITH RIGHT LUMBAR FIVE FORAMINOTOMY;  Surgeon: Jessy Oto, MD;  Location: [place];  Service: Orthopedics;  Laterality: N/A;   LUMBAR WOUND DEBRIDEMENT N/A 06/11/2018   Procedure: LUMBAR WOUND DEBRIDEMENT;  Surgeon: Jessy Oto, MD;  Location: [place];  Service: Orthopedics;  Laterality: N/A;   RADIOLOGY WITH ANESTHESIA N/A 09/09/2018   Procedure: LUMBER SPINE WITHOUT CONTRAST;  Surgeon: Radiologist, Medication, MD;  Location: [place];  Service: Radiology;  Laterality: N/A;   REVERSE SHOULDER ARTHROPLASTY Left 11/16/2021   Procedure: LEFT REVERSE SHOULDER ARTHROPLASTY;  Surgeon: Meredith Pel, MD;  Location: [place];  Service: Orthopedics;  Laterality: Left;   ROTATOR CUFF REPAIR     left    SHOULDER SURGERY     right to repair ligament tear   TOTAL HIP ARTHROPLASTY Left 09/09/2015   Procedure: LEFT TOTAL HIP ARTHROPLASTY ANTERIOR APPROACH;  Surgeon: Mcarthur Rossetti, MD;  Location: [facility];  Service: Orthopedics;  Laterality: Left;   TOTAL KNEE ARTHROPLASTY  04/23/2012   Procedure: TOTAL KNEE ARTHROPLASTY;  Surgeon: Newt Minion, MD;  Location: [facility];  Service: Orthopedics;  Laterality: Right;  Right Total Knee Arthroplasty   TUBAL LIGATION  1996   Social History   Occupational History   Not on file  Tobacco Use   Smoking status: Former    Packs/day: 0.50    Years: 4.00    Total pack years: 2.00    Types: Cigarettes    Quit date: 09/18/1987    Years since quitting: 34.8   Smokeless tobacco: Never  Vaping Use   Vaping Use: Never used  Substance and Sexual Activity   Alcohol use: No   Drug use: No   Sexual activity: Yes    Birth control/protection: Surgical

## 2022-07-12 NOTE — Progress Notes (Signed)
Post-Op Visit Note   Patient: Marilyn [name]           Date of Birth: 10/06/1970           MRN: [id number] Visit Date: 07/09/2022 PCP: Bridget Hartshorn, NP   Assessment & Plan:  Chief Complaint:  Chief Complaint  Patient presents with   Left Shoulder - Wound Check    11/16/2021 left [facility]   Visit Diagnoses:  1. Status post reverse arthroplasty of left shoulder     Plan: [name] is here for her final check on her left shoulder incision.  She underwent left reverse shoulder replacement 11/16/2021.  She states she is doing a lot better.  Does not have any drainage.  Shoulder has minimal pain but still remains stiff.  She is seeing Jeneen Rinks for her back on Thursday.  On examination the incision has healed nicely.  She does not quite get to 90 degrees of forward flexion or abduction but below shoulder level her range of motion is reasonably pain-free.  She will follow-up with [place] as needed  Follow-Up Instructions: No follow-ups on file.   Orders:  No orders of the defined types were placed in this encounter.  No orders of the defined types were placed in this encounter.   Imaging: No results found.  PMFS History: Patient Active Problem List   Diagnosis Date Noted   Lactic acidosis 12/18/2021   Dyspnea on exertion 12/18/2021   Anemia 12/18/2021   Gout 12/18/2021   Acute pain of left shoulder    Arthritis of left shoulder region    S/P reverse total shoulder arthroplasty, left 11/16/2021   Unilateral primary osteoarthritis, right hip 05/09/2021   S/P lumbar spinal fusion 05/09/2021   S/P insertion of spinal cord stimulator 05/09/2021   H/O total knee replacement, bilateral 01/18/2021   Lumbar post-laminectomy syndrome 04/06/2020   Long-term current use of opiate analgesic 11/04/2019   Leg pain, right 10/20/2019   Dysesthesia 10/20/2019   Lateral femoral cutaneous neuropathy, right 10/20/2019   Other spondylosis with radiculopathy, lumbar region    Status post lumbar  laminectomy 06/23/2019   C. difficile colitis 05/22/2019   Hypophosphatemia 05/21/2019   Syncope 05/20/2019   OSA on CPAP    Acute gastroenteritis    Anemia due to blood loss, acute 06/18/2018    Class: Acute   Plantar fasciitis of left foot 06/16/2018    Class: Chronic   Tendonitis, Achilles, right 06/16/2018    Class: Chronic   Osteochondral talar dome lesion 06/16/2018    Class: Chronic   Deep incisional surgical site infection    Superficial incisional surgical site infection 06/11/2018    Class: Acute   Right ankle effusion 06/11/2018    Class: Acute   Morbid (severe) obesity due to excess calories ([place]) 05/29/2018    Class: Chronic   Spondylolisthesis, lumbar region 05/27/2018    Class: Chronic   Spinal stenosis of lumbar region 05/27/2018    Class: Chronic   Urinary tract infection 05/27/2018    Class: Acute   Fusion of spine of lumbar region 05/27/2018   Pain of right hip joint 07/03/2017   Acute right-sided low back pain with right-sided sciatica 03/27/2017   Chronic right shoulder pain 02/13/2017   History of rotator cuff tear 11/30/2016   Impingement syndrome of right shoulder 11/30/2016   Exacerbation of asthma 07/18/2016   Hypokalemia 07/18/2016   Hypertension 07/18/2016   Depression with anxiety 07/18/2016   Hypothyroidism 07/18/2016   Hyperglycemia 07/18/2016  Asthma, chronic 07/18/2016   Surgical wound dehiscence left hip; questionable superficial infection 11/10/2015   Postoperative wound infection 11/10/2015   Osteoarthritis of left hip 09/09/2015   Status post total replacement of left hip 09/09/2015   Obesity (BMI 35.0-39.9 without comorbidity) 04/28/2013   Past Medical History:  Diagnosis Date   Anemia    taking iron now. pt states having no current issues 09/02/2015   Anginal pain (HCC)    pt states experiences chest wall pain pt states related to her asthma    Anxiety    with MRI's   Arthritis    Everywhere   Asthma    Depression     Dizziness    GERD (gastroesophageal reflux disease)    Headache(784.0)    HX OF MIGRAINES   History of bronchitis    Hypertension    Hypothyroidism    takes levothyroxen   OSA on CPAP    wears cpap   Shortness of breath    with exertion   Wears glasses     Family History  Problem Relation Age of Onset   Cancer Mother        colon   Epilepsy Mother    Cancer Father        prostate   Diabetes Father    Hypertension Father    Hypertension Maternal Aunt    Diabetes Maternal Aunt    Hypertension Paternal Aunt     Past Surgical History:  Procedure Laterality Date   APPLICATION OF WOUND VAC  11/16/2021   Procedure: APPLICATION OF WOUND VAC;  Surgeon: Meredith Pel, MD;  Location: [place];  Service: Orthopedics;;   APPLICATION OF WOUND VAC Left 12/01/2021   Procedure: APPLICATION OF WOUND VAC;  Surgeon: Meredith Pel, MD;  Location: [place];  Service: Orthopedics;  Laterality: Left;   BACK SURGERY     CESAREAN SECTION     times 2   CHOLECYSTECTOMY     COLONOSCOPY  2020   ENDOMETRIAL ABLATION     I & D EXTREMITY Left 12/06/2021   Procedure: LEFT SHOULDER DEBRIDEMENT AND WOUND CLOSURE;  Surgeon: Newt Minion, MD;  Location: [facility];  Service: Orthopedics;  Laterality: Left;   I & D EXTREMITY Left 12/20/2021   Procedure: LEFT SHOULDER DEBRIDEMENT;  Surgeon: Newt Minion, MD;  Location: [facility];  Service: Orthopedics;  Laterality: Left;   I & D EXTREMITY Left 12/22/2021   Procedure: LEFT SHOULDER DEBRIDEMENT;  Surgeon: Newt Minion, MD;  Location: [facility];  Service: Orthopedics;  Laterality: Left;   INCISION AND DRAINAGE Left 12/01/2021   Procedure: LEFT SHOULDER IRRIGATION AND EXCISIONAL DEBRIDEMENT;  Surgeon: Meredith Pel, MD;  Location: [facility];  Service: Orthopedics;  Laterality: Left;   INCISION AND DRAINAGE HIP Left 11/10/2015   Procedure: IRRIGATION AND DEBRIDEMENT LEFT HIP INCISION;  Surgeon: Mcarthur Rossetti, MD;  Location: [facility];  Service: Orthopedics;   Laterality: Left;   JOINT REPLACEMENT  2011   total left knee   KNEE ARTHROPLASTY  04/23/2012   right    KNEE ARTHROSCOPY     LUMBAR LAMINECTOMY/DECOMPRESSION MICRODISCECTOMY N/A 06/23/2019   Procedure: RIGHT LUMBAR FIVE THROUGH SACRAL ONE PARTIAL HEMILAMINECTOMY WITH RIGHT LUMBAR FIVE FORAMINOTOMY;  Surgeon: Jessy Oto, MD;  Location: [place];  Service: Orthopedics;  Laterality: N/A;   LUMBAR WOUND DEBRIDEMENT N/A 06/11/2018   Procedure: LUMBAR WOUND DEBRIDEMENT;  Surgeon: Jessy Oto, MD;  Location: [facility];  Service: Orthopedics;  Laterality: N/A;  RADIOLOGY WITH ANESTHESIA N/A 09/09/2018   Procedure: LUMBER SPINE WITHOUT CONTRAST;  Surgeon: Radiologist, Medication, MD;  Location: [facility];  Service: Radiology;  Laterality: N/A;   REVERSE SHOULDER ARTHROPLASTY Left 11/16/2021   Procedure: LEFT REVERSE SHOULDER ARTHROPLASTY;  Surgeon: Meredith Pel, MD;  Location: [facility];  Service: Orthopedics;  Laterality: Left;   ROTATOR CUFF REPAIR     left    SHOULDER SURGERY     right to repair ligament tear   TOTAL HIP ARTHROPLASTY Left 09/09/2015   Procedure: LEFT TOTAL HIP ARTHROPLASTY ANTERIOR APPROACH;  Surgeon: Mcarthur Rossetti, MD;  Location: [facility];  Service: Orthopedics;  Laterality: Left;   TOTAL KNEE ARTHROPLASTY  04/23/2012   Procedure: TOTAL KNEE ARTHROPLASTY;  Surgeon: Newt Minion, MD;  Location: [facility];  Service: Orthopedics;  Laterality: Right;  Right Total Knee Arthroplasty   TUBAL LIGATION  1996   Social History   Occupational History   Not on file  Tobacco Use   Smoking status: Former    Packs/day: 0.50    Years: 4.00    Total pack years: 2.00    Types: Cigarettes    Quit date: 09/18/1987    Years since quitting: 34.8   Smokeless tobacco: Never  Vaping Use   Vaping Use: Never used  Substance and Sexual Activity   Alcohol use: No   Drug use: No   Sexual activity: Yes    Birth control/protection: Surgical

## 2022-07-13 ENCOUNTER — Other Ambulatory Visit: Payer: Self-pay

## 2022-07-13 ENCOUNTER — Ambulatory Visit: Payer: Medicare Other | Attending: Orthopedic Surgery | Admitting: Physical Therapy

## 2022-07-13 DIAGNOSIS — M62838 Other muscle spasm: Secondary | ICD-10-CM | POA: Diagnosis present

## 2022-07-13 DIAGNOSIS — G8929 Other chronic pain: Secondary | ICD-10-CM | POA: Diagnosis not present

## 2022-07-13 DIAGNOSIS — M5441 Lumbago with sciatica, right side: Secondary | ICD-10-CM | POA: Diagnosis not present

## 2022-07-13 DIAGNOSIS — M4316 Spondylolisthesis, lumbar region: Secondary | ICD-10-CM | POA: Diagnosis not present

## 2022-07-13 DIAGNOSIS — M5459 Other low back pain: Secondary | ICD-10-CM | POA: Diagnosis not present

## 2022-07-13 DIAGNOSIS — M5136 Other intervertebral disc degeneration, lumbar region: Secondary | ICD-10-CM | POA: Diagnosis not present

## 2022-07-13 NOTE — Therapy (Signed)
OUTPATIENT PHYSICAL THERAPY THORACOLUMBAR EVALUATION   Patient Name: Marilyn [name] MRN: [id number] DOB:1971-06-29, 51 y.o., female Today's Date: 07/13/2022   PT End of Session - 07/13/22 1304     Visit Number 1    Number of Visits 12    Date for PT Re-Evaluation 10/11/22    Authorization Type FOTO AT LEAST EVERY 5TH VISIT.  PROGRESS NOTE AT 10TH VISIT.  KX MODIFIER AFTER 15 VISITS.    PT Start Time 0903    PT Stop Time 0955    PT Time Calculation (min) 52 min    Activity Tolerance Patient tolerated treatment well    Behavior During Therapy WFL for tasks assessed/performed             Past Medical History:  Diagnosis Date   Anemia    taking iron now. pt states having no current issues 09/02/2015   Anginal pain (HCC)    pt states experiences chest wall pain pt states related to her asthma    Anxiety    with MRI's   Arthritis    Everywhere   Asthma    Depression    Dizziness    GERD (gastroesophageal reflux disease)    Headache(784.0)    HX OF MIGRAINES   History of bronchitis    Hypertension    Hypothyroidism    takes levothyroxen   OSA on CPAP    wears cpap   Shortness of breath    with exertion   Wears glasses    Past Surgical History:  Procedure Laterality Date   APPLICATION OF WOUND VAC  11/16/2021   Procedure: APPLICATION OF WOUND VAC;  Surgeon: Cammy Copa, MD;  Location: [facility] OR;  Service: Orthopedics;;   APPLICATION OF WOUND VAC Left 12/01/2021   Procedure: APPLICATION OF WOUND VAC;  Surgeon: Cammy Copa, MD;  Location: [facility] OR;  Service: Orthopedics;  Laterality: Left;   BACK SURGERY     CESAREAN SECTION     times 2   CHOLECYSTECTOMY     COLONOSCOPY  2020   ENDOMETRIAL ABLATION     I & D EXTREMITY Left 12/06/2021   Procedure: LEFT SHOULDER DEBRIDEMENT AND WOUND CLOSURE;  Surgeon: [name], MD;  Location: [facility] OR;  Service: Orthopedics;  Laterality: Left;   I & D EXTREMITY Left 12/20/2021   Procedure: LEFT SHOULDER DEBRIDEMENT;   Surgeon: [name], MD;  Location: [facility] OR;  Service: Orthopedics;  Laterality: Left;   I & D EXTREMITY Left 12/22/2021   Procedure: LEFT SHOULDER DEBRIDEMENT;  Surgeon: [name], MD;  Location: [facility] OR;  Service: Orthopedics;  Laterality: Left;   INCISION AND DRAINAGE Left 12/01/2021   Procedure: LEFT SHOULDER IRRIGATION AND EXCISIONAL DEBRIDEMENT;  Surgeon: Cammy Copa, MD;  Location: [facility] OR;  Service: Orthopedics;  Laterality: Left;   INCISION AND DRAINAGE HIP Left 11/10/2015   Procedure: IRRIGATION AND DEBRIDEMENT LEFT HIP INCISION;  Surgeon: Kathryne Hitch, MD;  Location: [facility] OR;  Service: Orthopedics;  Laterality: Left;   JOINT REPLACEMENT  2011   total left knee   KNEE ARTHROPLASTY  04/23/2012   right    KNEE ARTHROSCOPY     LUMBAR LAMINECTOMY/DECOMPRESSION MICRODISCECTOMY N/A 06/23/2019   Procedure: RIGHT LUMBAR FIVE THROUGH SACRAL ONE PARTIAL HEMILAMINECTOMY WITH RIGHT LUMBAR FIVE FORAMINOTOMY;  Surgeon: Kerrin Champagne, MD;  Location: [facility] OR;  Service: Orthopedics;  Laterality: N/A;   LUMBAR WOUND DEBRIDEMENT N/A 06/11/2018   Procedure: LUMBAR WOUND DEBRIDEMENT;  Surgeon: Vira Browns  E, MD;  Location: [place];  Service: Orthopedics;  Laterality: N/A;   RADIOLOGY WITH ANESTHESIA N/A 09/09/2018   Procedure: LUMBER SPINE WITHOUT CONTRAST;  Surgeon: Radiologist, Medication, MD;  Location: [place];  Service: Radiology;  Laterality: N/A;   REVERSE SHOULDER ARTHROPLASTY Left 11/16/2021   Procedure: LEFT REVERSE SHOULDER ARTHROPLASTY;  Surgeon: Meredith Pel, MD;  Location: [place];  Service: Orthopedics;  Laterality: Left;   ROTATOR CUFF REPAIR     left    SHOULDER SURGERY     right to repair ligament tear   TOTAL HIP ARTHROPLASTY Left 09/09/2015   Procedure: LEFT TOTAL HIP ARTHROPLASTY ANTERIOR APPROACH;  Surgeon: Mcarthur Rossetti, MD;  Location: [facility];  Service: Orthopedics;  Laterality: Left;   TOTAL KNEE ARTHROPLASTY  04/23/2012   Procedure: TOTAL KNEE  ARTHROPLASTY;  Surgeon: Newt Minion, MD;  Location: [place];  Service: Orthopedics;  Laterality: Right;  Right Total Knee Arthroplasty   TUBAL LIGATION  1996   Patient Active Problem List   Diagnosis Date Noted   Lactic acidosis 12/18/2021   Dyspnea on exertion 12/18/2021   Anemia 12/18/2021   Gout 12/18/2021   Acute pain of left shoulder    Arthritis of left shoulder region    S/P reverse total shoulder arthroplasty, left 11/16/2021   Unilateral primary osteoarthritis, right hip 05/09/2021   S/P lumbar spinal fusion 05/09/2021   S/P insertion of spinal cord stimulator 05/09/2021   H/O total knee replacement, bilateral 01/18/2021   Lumbar post-laminectomy syndrome 04/06/2020   Long-term current use of opiate analgesic 11/04/2019   Leg pain, right 10/20/2019   Dysesthesia 10/20/2019   Lateral femoral cutaneous neuropathy, 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 difficile colitis 05/22/2019   Hypophosphatemia 05/21/2019   Syncope 05/20/2019   OSA on CPAP    Acute gastroenteritis    Anemia due to blood loss, acute 06/18/2018    Class: Acute   Plantar fasciitis of left foot 06/16/2018    Class: Chronic   Tendonitis, Achilles, right 06/16/2018    Class: Chronic   Osteochondral talar dome lesion 06/16/2018    Class: Chronic   Deep incisional surgical site infection    Superficial incisional surgical site infection 06/11/2018    Class: Acute   Right ankle effusion 06/11/2018    Class: Acute   Morbid (severe) obesity due to excess calories ([place]) 05/29/2018    Class: Chronic   Spondylolisthesis, lumbar region 05/27/2018    Class: Chronic   Spinal stenosis of lumbar region 05/27/2018    Class: Chronic   Urinary tract infection 05/27/2018    Class: Acute   Fusion of spine of lumbar region 05/27/2018   Pain of right hip joint 07/03/2017   Acute right-sided low back pain with right-sided sciatica  03/27/2017   Chronic right shoulder pain 02/13/2017   History of rotator cuff tear 11/30/2016   Impingement syndrome of right shoulder 11/30/2016   Exacerbation of asthma 07/18/2016   Hypokalemia 07/18/2016   Hypertension 07/18/2016   Depression with anxiety 07/18/2016   Hypothyroidism 07/18/2016   Hyperglycemia 07/18/2016   Asthma, chronic 07/18/2016   Surgical wound dehiscence left hip; questionable superficial infection 11/10/2015   Postoperative wound infection 11/10/2015   Osteoarthritis of left hip 09/09/2015   Status post total replacement of left hip 09/09/2015   Obesity (BMI 35.0-39.9 without comorbidity) 04/28/2013      REFERRING PROVIDER: Basil Dess MD  REFERRING DIAG: Degenerative disc, lumbar  Rationale for Evaluation and Treatment: Rehabilitation  THERAPY DIAG:  Other low back pain  Other muscle spasm  ONSET DATE: Ongoing with severe exacerbation in May/June.  SUBJECTIVE:                                                                                                                                                                                           SUBJECTIVE STATEMENT: The patient presents to the clinic with chronic low back pain with a severe exacerbation in May/June.  Her pain is rated at a 7/10 but has gone to a 10/10.  In fact, she recently had an ED visit.  She is walking with a cane for additional safety due to her high pain-level.  Rest can decrease her pain some.  Standing and bending increases her pain.  PERTINENT HISTORY:  Lumbar fusion, spinal stimulator.  PAIN:  Are you having pain? Yes: NPRS scale: 7/10 Pain location: Lumbar Pain description: Ache, sore, throbbing, shooting Aggravating factors: As above. Relieving factors: As above.   WEIGHT BEARING RESTRICTIONS: No  FALLS:  Has patient fallen in last 6 months? Yes. Number of falls 2  LIVING ENVIRONMENT: Lives in: House/apartment Has following equipment at home: Single point  cane  OCCUPATION: Disabled.  PLOF: Independent with basic ADLs  PATIENT GOALS: Reduce pain especially with motion.   OBJECTIVE:     POSTURE: rounded shoulders, forward head, and flexed trunk   PALPATION: Very tender to palpation with even light overpressure at L3 to L5-S1, bilateral SIJ's and bilateral upper gluteal region.  LUMBAR ROM:   Patient can achieve an upright posture and her flexion is limited by 50%.   LOWER EXTREMITY MMT:    Patient able to provide a normal bilateral knee and ankle strength grade.  However, the longer she is up she states her LE's feel tired.    GAIT: Antalgic gait pattern with a straight cane.  Patient in obvious pain.  TODAY'S TREATMENT:  DATE: Seated:  HMP and IFC at 80-150 Hz on 40% scan x 20 minutes to patient's bilateral lumbar region.  Patient tolerated treatment without complaint.     ASSESSMENT:  CLINICAL IMPRESSION: The patient presents to the clinic with c/o of low back pain that has been severe recently.  Her gait is antalgic in nature.  She is very tender to palpation with overpressure from L2 to L5-S1, bilateral SIJ's and upper gluteal musculature.  She stands in a flexed spine posture.  Her pain increases with increased up times. Her lumbar flexion is limited by 50% and extension to 0 degrees.  Patient will benefit from skilled physical therapy intervention to address pain and deficits.  OBJECTIVE IMPAIRMENTS: Abnormal gait, decreased activity tolerance, decreased mobility, difficulty walking, decreased ROM, increased muscle spasms, postural dysfunction, and pain.   ACTIVITY LIMITATIONS: carrying, lifting, bending, sitting, standing, sleeping, transfers, bed mobility, and locomotion level  PARTICIPATION LIMITATIONS: meal prep, cleaning, and laundry  PERSONAL FACTORS: Time since onset of  injury/illness/exacerbation and 1 comorbidity: prior lumbar fusion  are also affecting patient's functional outcome.   REHAB POTENTIAL: Fair    CLINICAL DECISION MAKING: Stable/uncomplicated  EVALUATION COMPLEXITY: Low   GOALS:  LONG TERM GOALS: Target date: 08/24/2022  Ind with an HEP. Baseline:  Goal status: INITIAL  2.  Perform basic ADL's with pain not > 4-5/10. Baseline:  Goal status: INITIAL  3.  Walk with pain not > 4/10. Baseline:  Goal status: INITIAL  PLAN:  PT FREQUENCY: 2x/week  PT DURATION: 6 weeks  PLANNED INTERVENTIONS: Therapeutic exercises, Therapeutic activity, Neuromuscular re-education, Patient/Family education, Self Care, Dry Needling, Electrical stimulation, Cryotherapy, Moist heat, Ultrasound, and Manual therapy.  PLAN FOR NEXT SESSION: FOTO.  Combo e'stim/US, STW/M, dry needling.  Gentle core exercise progression per patient tolerance.   Armya Westerhoff, [place], PT 07/13/2022, 1:19 PM

## 2022-07-17 ENCOUNTER — Ambulatory Visit: Payer: Medicare Other | Admitting: Sports Medicine

## 2022-07-18 ENCOUNTER — Encounter: Payer: Self-pay | Admitting: Physical Therapy

## 2022-07-18 ENCOUNTER — Ambulatory Visit: Payer: Medicare Other | Attending: Orthopedic Surgery | Admitting: Physical Therapy

## 2022-07-18 DIAGNOSIS — M5459 Other low back pain: Secondary | ICD-10-CM | POA: Insufficient documentation

## 2022-07-18 DIAGNOSIS — M62838 Other muscle spasm: Secondary | ICD-10-CM | POA: Diagnosis present

## 2022-07-18 NOTE — Therapy (Addendum)
OUTPATIENT PHYSICAL THERAPY THORACOLUMBAR EVALUATION   Patient Name: Marilyn [name] MRN: [id number] DOB:Apr 21, 1971, 51 y.o., female Today's Date: 07/18/2022   PT End of Session - 07/18/22 1201     Visit Number 2    Number of Visits 12    Date for PT Re-Evaluation 10/11/22    Authorization Type FOTO AT LEAST EVERY 5TH VISIT.  PROGRESS NOTE AT 10TH VISIT.  KX MODIFIER AFTER 15 VISITS.    PT Start Time 1124    PT Stop Time 1218    PT Time Calculation (min) 54 min    Activity Tolerance Patient tolerated treatment well    Behavior During Therapy WFL for tasks assessed/performed             Past Medical History:  Diagnosis Date   Anemia    taking iron now. pt states having no current issues 09/02/2015   Anginal pain (HCC)    pt states experiences chest wall pain pt states related to her asthma    Anxiety    with MRI's   Arthritis    Everywhere   Asthma    Depression    Dizziness    GERD (gastroesophageal reflux disease)    Headache(784.0)    HX OF MIGRAINES   History of bronchitis    Hypertension    Hypothyroidism    takes levothyroxen   OSA on CPAP    wears cpap   Shortness of breath    with exertion   Wears glasses    Past Surgical History:  Procedure Laterality Date   APPLICATION OF WOUND VAC  11/16/2021   Procedure: APPLICATION OF WOUND VAC;  Surgeon: Cammy Copa, MD;  Location: [facility] OR;  Service: Orthopedics;;   APPLICATION OF WOUND VAC Left 12/01/2021   Procedure: APPLICATION OF WOUND VAC;  Surgeon: Cammy Copa, MD;  Location: [facility] OR;  Service: Orthopedics;  Laterality: Left;   BACK SURGERY     CESAREAN SECTION     times 2   CHOLECYSTECTOMY     COLONOSCOPY  2020   ENDOMETRIAL ABLATION     I & D EXTREMITY Left 12/06/2021   Procedure: LEFT SHOULDER DEBRIDEMENT AND WOUND CLOSURE;  Surgeon: [name], MD;  Location: [facility] OR;  Service: Orthopedics;  Laterality: Left;   I & D EXTREMITY Left 12/20/2021   Procedure: LEFT SHOULDER DEBRIDEMENT;   Surgeon: [name], MD;  Location: [facility] OR;  Service: Orthopedics;  Laterality: Left;   I & D EXTREMITY Left 12/22/2021   Procedure: LEFT SHOULDER DEBRIDEMENT;  Surgeon: [name], MD;  Location: [facility] OR;  Service: Orthopedics;  Laterality: Left;   INCISION AND DRAINAGE Left 12/01/2021   Procedure: LEFT SHOULDER IRRIGATION AND EXCISIONAL DEBRIDEMENT;  Surgeon: Cammy Copa, MD;  Location: [facility] OR;  Service: Orthopedics;  Laterality: Left;   INCISION AND DRAINAGE HIP Left 11/10/2015   Procedure: IRRIGATION AND DEBRIDEMENT LEFT HIP INCISION;  Surgeon: Kathryne Hitch, MD;  Location: [facility];  Service: Orthopedics;  Laterality: Left;   JOINT REPLACEMENT  2011   total left knee   KNEE ARTHROPLASTY  04/23/2012   right    KNEE ARTHROSCOPY     LUMBAR LAMINECTOMY/DECOMPRESSION MICRODISCECTOMY N/A 06/23/2019   Procedure: RIGHT LUMBAR FIVE THROUGH SACRAL ONE PARTIAL HEMILAMINECTOMY WITH RIGHT LUMBAR FIVE FORAMINOTOMY;  Surgeon: Kerrin Champagne, MD;  Location: [facility];  Service: Orthopedics;  Laterality: N/A;   LUMBAR WOUND DEBRIDEMENT N/A 06/11/2018   Procedure: LUMBAR WOUND DEBRIDEMENT;  Surgeon: Vira Browns  E, MD;  Location: [place];  Service: Orthopedics;  Laterality: N/A;   RADIOLOGY WITH ANESTHESIA N/A 09/09/2018   Procedure: LUMBER SPINE WITHOUT CONTRAST;  Surgeon: Radiologist, Medication, MD;  Location: [place];  Service: Radiology;  Laterality: N/A;   REVERSE SHOULDER ARTHROPLASTY Left 11/16/2021   Procedure: LEFT REVERSE SHOULDER ARTHROPLASTY;  Surgeon: Meredith Pel, MD;  Location: [place];  Service: Orthopedics;  Laterality: Left;   ROTATOR CUFF REPAIR     left    SHOULDER SURGERY     right to repair ligament tear   TOTAL HIP ARTHROPLASTY Left 09/09/2015   Procedure: LEFT TOTAL HIP ARTHROPLASTY ANTERIOR APPROACH;  Surgeon: Mcarthur Rossetti, MD;  Location: [facility];  Service: Orthopedics;  Laterality: Left;   TOTAL KNEE ARTHROPLASTY  04/23/2012   Procedure: TOTAL KNEE  ARTHROPLASTY;  Surgeon: Newt Minion, MD;  Location: [facility];  Service: Orthopedics;  Laterality: Right;  Right Total Knee Arthroplasty   TUBAL LIGATION  1996   Patient Active Problem List   Diagnosis Date Noted   Lactic acidosis 12/18/2021   Dyspnea on exertion 12/18/2021   Anemia 12/18/2021   Gout 12/18/2021   Acute pain of left shoulder    Arthritis of left shoulder region    S/P reverse total shoulder arthroplasty, left 11/16/2021   Unilateral primary osteoarthritis, right hip 05/09/2021   S/P lumbar spinal fusion 05/09/2021   S/P insertion of spinal cord stimulator 05/09/2021   H/O total knee replacement, bilateral 01/18/2021   Lumbar post-laminectomy syndrome 04/06/2020   Long-term current use of opiate analgesic 11/04/2019   Leg pain, right 10/20/2019   Dysesthesia 10/20/2019   Lateral femoral cutaneous neuropathy, 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 difficile colitis 05/22/2019   Hypophosphatemia 05/21/2019   Syncope 05/20/2019   OSA on CPAP    Acute gastroenteritis    Anemia due to blood loss, acute 06/18/2018    Class: Acute   Plantar fasciitis of left foot 06/16/2018    Class: Chronic   Tendonitis, Achilles, right 06/16/2018    Class: Chronic   Osteochondral talar dome lesion 06/16/2018    Class: Chronic   Deep incisional surgical site infection    Superficial incisional surgical site infection 06/11/2018    Class: Acute   Right ankle effusion 06/11/2018    Class: Acute   Morbid (severe) obesity due to excess calories ([place]) 05/29/2018    Class: Chronic   Spondylolisthesis, lumbar region 05/27/2018    Class: Chronic   Spinal stenosis of lumbar region 05/27/2018    Class: Chronic   Urinary tract infection 05/27/2018    Class: Acute   Fusion of spine of lumbar region 05/27/2018   Pain of right hip joint 07/03/2017   Acute right-sided low back pain with right-sided sciatica  03/27/2017   Chronic right shoulder pain 02/13/2017   History of rotator cuff tear 11/30/2016   Impingement syndrome of right shoulder 11/30/2016   Exacerbation of asthma 07/18/2016   Hypokalemia 07/18/2016   Hypertension 07/18/2016   Depression with anxiety 07/18/2016   Hypothyroidism 07/18/2016   Hyperglycemia 07/18/2016   Asthma, chronic 07/18/2016   Surgical wound dehiscence left hip; questionable superficial infection 11/10/2015   Postoperative wound infection 11/10/2015   Osteoarthritis of left hip 09/09/2015   Status post total replacement of left hip 09/09/2015   Obesity (BMI 35.0-39.9 without comorbidity) 04/28/2013      REFERRING PROVIDER: Basil Dess MD  REFERRING DIAG: Degenerative disc, lumbar  Rationale for Evaluation and Treatment: Rehabilitation  THERAPY DIAG:  Other low back pain  Other muscle spasm  ONSET DATE: Ongoing with severe exacerbation in May/June.  SUBJECTIVE:                                                                                                                                                                                           SUBJECTIVE STATEMENT: Pain-level remains high.  PERTINENT HISTORY:  Lumbar fusion, spinal stimulator.  PAIN:  Are you having pain? Yes: NPRS scale: 7-8/10 Pain location: Lumbar Pain description: Ache, sore, throbbing, shooting Aggravating factors: As above. Relieving factors: As above.   WEIGHT BEARING RESTRICTIONS: No  FALLS:  Has patient fallen in last 6 months? Yes. Number of falls 2  LIVING ENVIRONMENT: Lives in: House/apartment Has following equipment at home: Single point cane  OCCUPATION: Disabled.  PLOF: Independent with basic ADLs  PATIENT GOALS: Reduce pain especially with motion.   OBJECTIVE:      TODAY'S TREATMENT:                                                                                                                              DATE: Seated: Combo e'stim/US at  1.50 W/CM2 x 12 minutes to bilateral lumbar region f/b STW/M x 11 minutes including ischemic release technique to decrease paina nd tone f/b HMP and IFC at 80-150 Hz on 40% scan x 20 minutes to patient's bilateral lumbar region.  Patient tolerated treatment without complaint.     ASSESSMENT:  CLINICAL IMPRESSION: Patient's pain-level remains high.  She was very palpably tender over her right low back musculature, right SIJ and upper gluteal musculature. Good tolerance for treatment today with normal modality response following removal of modality.    GOALS:  LONG TERM GOALS: Target date: 08/29/2022  Ind with an HEP. Baseline:  Goal status: INITIAL  2.  Perform basic ADL's with pain not > 4-5/10. Baseline:  Goal status: INITIAL  3.  Walk with pain not > 4/10. Baseline:  Goal status: INITIAL  PLAN:  PT  FREQUENCY: 2x/week  PT DURATION: 6 weeks  PLANNED INTERVENTIONS: Therapeutic exercises, Therapeutic activity, Neuromuscular re-education, Patient/Family education, Self Care, Dry Needling, Electrical stimulation, Cryotherapy, Moist heat, Ultrasound, and Manual therapy.  PLAN FOR NEXT SESSION: FOTO.  Combo e'stim/US, STW/M, dry needling.  Gentle core exercise progression per patient tolerance.   Jaren Vanetten, [place], PT 07/18/2022, 12:23 PM

## 2022-07-20 ENCOUNTER — Ambulatory Visit: Payer: Medicare Other | Admitting: Physical Therapy

## 2022-07-22 ENCOUNTER — Emergency Department (HOSPITAL_BASED_OUTPATIENT_CLINIC_OR_DEPARTMENT_OTHER)
Admission: EM | Admit: 2022-07-22 | Discharge: 2022-07-22 | Disposition: A | Discharge status: Home / Self Care | Payer: Medicare Other | Attending: Emergency Medicine | Admitting: Emergency Medicine

## 2022-07-22 ENCOUNTER — Encounter (HOSPITAL_BASED_OUTPATIENT_CLINIC_OR_DEPARTMENT_OTHER): Payer: Self-pay | Admitting: Emergency Medicine

## 2022-07-22 DIAGNOSIS — Z91040 Latex allergy status: Secondary | ICD-10-CM | POA: Insufficient documentation

## 2022-07-22 DIAGNOSIS — M545 Low back pain, unspecified: Secondary | ICD-10-CM | POA: Diagnosis present

## 2022-07-22 DIAGNOSIS — G8929 Other chronic pain: Secondary | ICD-10-CM

## 2022-07-22 MED ORDER — LIDOCAINE 5 % EX PTCH
1.0000 | MEDICATED_PATCH | CUTANEOUS | Status: DC
  Administered 2022-07-22: 1 via TRANSDERMAL
  Filled 2022-07-22: qty 1

## 2022-07-22 MED ORDER — KETOROLAC TROMETHAMINE 30 MG/ML IJ SOLN
30.0000 mg | Freq: Once | INTRAMUSCULAR | Status: AC
  Administered 2022-07-22: 30 mg via INTRAMUSCULAR
  Filled 2022-07-22: qty 1

## 2022-07-22 MED ORDER — CYCLOBENZAPRINE HCL 5 MG PO TABS
5.0000 mg | ORAL_TABLET | Freq: Once | ORAL | Status: AC
  Administered 2022-07-22: 5 mg via ORAL
  Filled 2022-07-22: qty 1

## 2022-07-22 NOTE — ED Provider Notes (Signed)
[facility] EMERGENCY DEPT Provider Note   CSN: [id number] Arrival date & time: 07/22/22  1147     History  Chief Complaint  Patient presents with   Back Pain    Marilyn [name] , a 51 y.o. female  was evaluated in triage.  Pt complains of lumbar back painx2 months. Report hx of chronic back pain, sees pain specialist. Reports pain is worse than usual. No heavy lifting. Pain radiates to R leg. More painful when walking. No loss of bowel or bladder. Take norco 5 Q8H, has taken w/o relief. Has also taken muscle relaxers without relief. Is scheduled for MRI this week.      Home Medications Prior to Admission medications   Medication Sig Start Date End Date Taking? Authorizing Provider  albuterol (VENTOLIN HFA) 108 (90 Base) MCG/ACT inhaler Inhale 1-2 puffs into the lungs every 6 (six) hours as needed for wheezing or shortness of breath. 12/09/19   Henderly, Britni A, PA-C  allopurinol (ZYLOPRIM) 100 MG tablet Take 1 tablet (100 mg total) by mouth 2 (two) times daily. 02/13/22 05/14/22  Jessy Oto, MD  ammonium lactate (LAC-HYDRIN) 12 % lotion Apply 1 application. topically 2 (two) times daily as needed for dry skin. 07/17/21   [provider]  ascorbic Acid (VITAMIN C) 500 MG CPCR Take 500 mg by mouth daily. Gummy    [provider]  buPROPion (WELLBUTRIN XL) 300 MG 24 hr tablet Take 300 mg by mouth daily. 06/29/20   [provider]  cetirizine (ZYRTEC) 10 MG tablet Take 10 mg by mouth daily.    [provider]  cycloSPORINE (RESTASIS) 0.05 % ophthalmic emulsion Place 1 drop into both eyes 2 (two) times daily as needed (dry eyes).    [provider]  DULoxetine (CYMBALTA) 60 MG capsule Take 60 mg by mouth 2 (two) times daily. 05/02/21   [provider]  Ferrous Gluconate 324 (37.5 Fe) MG TABS Take 324 mg by mouth daily. 02/14/20   [provider]  furosemide (LASIX) 20 MG tablet Take 20 mg by mouth daily.     [provider]  hydrochlorothiazide (HYDRODIURIL) 25 MG tablet Take 25 mg by mouth daily. 09/15/21   [provider]  levothyroxine (SYNTHROID) 150 MCG tablet Take 150 mcg by mouth daily before breakfast. 05/18/19   [provider]  oxybutynin (DITROPAN XL) 15 MG 24 hr tablet Take 15 mg by mouth daily.  05/31/17   [provider]  oxyCODONE-acetaminophen (PERCOCET/ROXICET) 5-325 MG tablet Take 1 tablet by mouth every 6 (six) hours as needed for severe pain. 07/06/22   Truddie Hidden, MD  predniSONE (STERAPRED UNI-PAK 21 TAB) 10 MG (21) TBPK tablet 10mg  Tabs, 6 day taper. Use as directed 07/06/22   Truddie Hidden, MD  pregabalin (LYRICA) 75 MG capsule Take 1 capsule (75 mg total) by mouth 2 (two) times daily. 06/29/22   Jessy Oto, MD  vitamin B-12 (CYANOCOBALAMIN) 250 MCG tablet Take 250 mcg by mouth daily.    [provider]  Vitamin D, Ergocalciferol, (DRISDOL) 1.25 MG (50000 UNIT) CAPS capsule Take 50,000 Units by mouth every Monday.    [provider]      Allergies    Lisinopril, Bee venom, Bupropion, Latex, Propoxyphene, Methadone, Codeine, Meloxicam, Morphine and related, Tegaderm ag mesh [silver], and Tomato    Review of Systems   Review of Systems  Genitourinary:  Negative for difficulty urinating.  Musculoskeletal:  Positive for back pain.  Physical Exam Updated Vital Signs BP 129/74 (BP Location: Right Arm)   Pulse 70   Temp 98.1 F (36.7 C)   Resp 20   LMP  (LMP Unknown) Comment: tubal ligation  SpO2 98%  Physical Exam Constitutional:      General: She is not in acute distress.    Appearance: She is well-developed. She is not toxic-appearing.  HENT:     Head: Normocephalic and atraumatic.  Abdominal:     General: There is no distension.     Palpations: Abdomen is soft.     Tenderness: There is no abdominal tenderness.  Musculoskeletal:     Cervical back: Normal range of motion and neck supple. No  spinous process tenderness or muscular tenderness.     Comments: No obvious deformity, appreciable swelling, erythema, ecchymosis, significant open wounds, or increased warmth.  Back: TTP of lumbar spine.  no palpable step off or crepitus.  Lower extremities: ranging @ all major joints. +SLR of  R leg  Skin:    General: Skin is warm and dry.     Findings: No rash.  Neurological:     Mental Status: She is alert.     Comments: Sensation grossly intact to bilateral lower extremities. 5/5 symmetric strength with plantar/dorsiflexion bilaterally. Gait is intact without obvious foot drop.      ED Results / Procedures / Treatments   Labs (all labs ordered are listed, but only abnormal results are displayed) Labs Reviewed - No data to display  EKG None  Radiology No results found.  Procedures Procedures   Medications Ordered in ED Medications  lidocaine (LIDODERM) 5 % 1 patch (1 patch Transdermal Patch Applied 07/22/22 1500)  ketorolac (TORADOL) 30 MG/ML injection 30 mg (30 mg Intramuscular Given 07/22/22 1459)  cyclobenzaprine (FLEXERIL) tablet 5 mg (5 mg Oral Given 07/22/22 1458)    ED Course/ Medical Decision Making/ A&P                           Medical Decision Making Patient is a 51 year old female, here for back pain that is chronic in nature.  She has had [facility] multiple CTs, MRI I scheduled this week.  She states that her back pain has been severe today, and she would like some pain relief.  Takes Norco, every 8 hours and has not had any relief.  Will treat symptomatically given that she has no trauma, has known chronic back pain.  She has no red flag symptoms at this time.  No bowel or bladder loss.  Amount and/or Complexity of Data Reviewed Discussion of management or test interpretation with external provider(s): Discussed with patient, she is feeling much better, she is requesting discharge.  Discussed ibuprofen at home, along with home pain meds, follow-up with her  orthopedic surgeon, and pain specialist.  Patient voiced understanding.  Risk Prescription drug management.    Final Clinical Impression(s) / ED Diagnoses Final diagnoses:  None    Rx / DC Orders ED Discharge Orders     None         Jiovani Mccammon, [place], PA 07/22/22 1542    Loetta Rough, MD 07/23/22 1308

## 2022-07-22 NOTE — ED Provider Triage Note (Signed)
Emergency Medicine Provider Triage Evaluation Note  Marilyn [name] , a 51 y.o. female  was evaluated in triage.  Pt complains of lumbar back painx2 months. Report hx of chronic back pain, sees pain specialist. Reports pain is worse than usual. No heavy lifting. Pain radiates to R leg. More painful when walking. No loss of bowel or bladder. Take norco 5 Q8H, has taken w/o relief. Has also taken muscle relaxers.   Review of Systems  Positive: Back pain, R buttocks pain Negative: Loss of bowel and bladder.   Physical Exam  BP 129/74 (BP Location: Right Arm)   Pulse 70   Temp 98.1 F (36.7 C)   Resp 20   LMP  (LMP Unknown) Comment: tubal ligation  SpO2 98%  Gen:   Awake, no distress   Resp:  Normal effort  MSK:   Moves extremities without difficulty  Other:  +SLR of R leg raise  Medical Decision Making  Medically screening exam initiated at 2:50 PM.  Appropriate orders placed.  Marilyn [name] was informed that the remainder of the evaluation will be completed by another provider, this initial triage assessment does not replace that evaluation, and the importance of remaining in the ED until their evaluation is complete.    Osvaldo Shipper, [place] 07/22/22 1454

## 2022-07-22 NOTE — ED Triage Notes (Signed)
L lower back pain. Has chronic back pain. Seen here 10/20. Pain persists.

## 2022-07-22 NOTE — Discharge Instructions (Addendum)
Follow-up with your orthopedic specialist, and get your MRI.  Also talk to your pain specialist if your pain is uncontrolled.  Return to the ER if you have loss of bowel, bladder.

## 2022-07-22 NOTE — ED Notes (Signed)
Pt had told me she was feeling better and requested d/c.  PA advised.  When I called pt to bring her back to discuss d/c she was no longer in lobby

## 2022-07-23 ENCOUNTER — Ambulatory Visit: Payer: Medicare Other

## 2022-07-23 ENCOUNTER — Telehealth: Payer: Self-pay | Admitting: Physical Medicine and Rehabilitation

## 2022-07-23 DIAGNOSIS — M5459 Other low back pain: Secondary | ICD-10-CM

## 2022-07-23 DIAGNOSIS — M62838 Other muscle spasm: Secondary | ICD-10-CM

## 2022-07-23 NOTE — Telephone Encounter (Signed)
Patient has questions about an appointment [phone]

## 2022-07-23 NOTE — Telephone Encounter (Signed)
Spoke with patient and she wanted to know if it was okay to get SI injection on 07/25/22 and she received an injection today from [facility] Dr. Earlyne Iba. Spoke with Dr. Ernestina Patches and he stated it will not interfere. He also stated we can talk more at appointment about seeing both specialists. Verbalized understanding

## 2022-07-23 NOTE — Therapy (Signed)
OUTPATIENT PHYSICAL THERAPY THORACOLUMBAR TREATMENT   Patient Name: Marilyn [name] MRN: [id number] DOB:08-24-1971, 51 y.o., female Today's Date: 07/23/2022   PT End of Session - 07/23/22 1213     Visit Number 3    Number of Visits 12    Date for PT Re-Evaluation 10/11/22    Authorization Type FOTO AT LEAST EVERY 5TH VISIT.  PROGRESS NOTE AT 10TH VISIT.  KX MODIFIER AFTER 15 VISITS.    PT Start Time 1124    PT Stop Time 1159    PT Time Calculation (min) 35 min    Activity Tolerance Patient tolerated treatment well    Behavior During Therapy WFL for tasks assessed/performed             Past Medical History:  Diagnosis Date   Anemia    taking iron now. pt states having no current issues 09/02/2015   Anginal pain (HCC)    pt states experiences chest wall pain pt states related to her asthma    Anxiety    with MRI's   Arthritis    Everywhere   Asthma    Depression    Dizziness    GERD (gastroesophageal reflux disease)    Headache(784.0)    HX OF MIGRAINES   History of bronchitis    Hypertension    Hypothyroidism    takes levothyroxen   OSA on CPAP    wears cpap   Shortness of breath    with exertion   Wears glasses    Past Surgical History:  Procedure Laterality Date   APPLICATION OF WOUND VAC  11/16/2021   Procedure: APPLICATION OF WOUND VAC;  Surgeon: Meredith Pel, MD;  Location: [facility];  Service: Orthopedics;;   APPLICATION OF WOUND VAC Left 12/01/2021   Procedure: APPLICATION OF WOUND VAC;  Surgeon: Meredith Pel, MD;  Location: [facility];  Service: Orthopedics;  Laterality: Left;   BACK SURGERY     CESAREAN SECTION     times 2   CHOLECYSTECTOMY     COLONOSCOPY  2020   ENDOMETRIAL ABLATION     I & D EXTREMITY Left 12/06/2021   Procedure: LEFT SHOULDER DEBRIDEMENT AND WOUND CLOSURE;  Surgeon: Newt Minion, MD;  Location: [facility];  Service: Orthopedics;  Laterality: Left;   I & D EXTREMITY Left 12/20/2021   Procedure: LEFT SHOULDER DEBRIDEMENT;   Surgeon: Newt Minion, MD;  Location: [facility];  Service: Orthopedics;  Laterality: Left;   I & D EXTREMITY Left 12/22/2021   Procedure: LEFT SHOULDER DEBRIDEMENT;  Surgeon: Newt Minion, MD;  Location: [facility];  Service: Orthopedics;  Laterality: Left;   INCISION AND DRAINAGE Left 12/01/2021   Procedure: LEFT SHOULDER IRRIGATION AND EXCISIONAL DEBRIDEMENT;  Surgeon: Meredith Pel, MD;  Location: [facility];  Service: Orthopedics;  Laterality: Left;   INCISION AND DRAINAGE HIP Left 11/10/2015   Procedure: IRRIGATION AND DEBRIDEMENT LEFT HIP INCISION;  Surgeon: Mcarthur Rossetti, MD;  Location: [facility];  Service: Orthopedics;  Laterality: Left;   JOINT REPLACEMENT  2011   total left knee   KNEE ARTHROPLASTY  04/23/2012   right    KNEE ARTHROSCOPY     LUMBAR LAMINECTOMY/DECOMPRESSION MICRODISCECTOMY N/A 06/23/2019   Procedure: RIGHT LUMBAR FIVE THROUGH SACRAL ONE PARTIAL HEMILAMINECTOMY WITH RIGHT LUMBAR FIVE FORAMINOTOMY;  Surgeon: Jessy Oto, MD;  Location: [facility];  Service: Orthopedics;  Laterality: N/A;   LUMBAR WOUND DEBRIDEMENT N/A 06/11/2018   Procedure: LUMBAR WOUND DEBRIDEMENT;  Surgeon: Basil Dess  E, MD;  Location: [facility];  Service: Orthopedics;  Laterality: N/A;   RADIOLOGY WITH ANESTHESIA N/A 09/09/2018   Procedure: LUMBER SPINE WITHOUT CONTRAST;  Surgeon: Radiologist, Medication, MD;  Location: [facility];  Service: Radiology;  Laterality: N/A;   REVERSE SHOULDER ARTHROPLASTY Left 11/16/2021   Procedure: LEFT REVERSE SHOULDER ARTHROPLASTY;  Surgeon: Meredith Pel, MD;  Location: [facility];  Service: Orthopedics;  Laterality: Left;   ROTATOR CUFF REPAIR     left    SHOULDER SURGERY     right to repair ligament tear   TOTAL HIP ARTHROPLASTY Left 09/09/2015   Procedure: LEFT TOTAL HIP ARTHROPLASTY ANTERIOR APPROACH;  Surgeon: Mcarthur Rossetti, MD;  Location: [facility];  Service: Orthopedics;  Laterality: Left;   TOTAL KNEE ARTHROPLASTY  04/23/2012   Procedure: TOTAL KNEE  ARTHROPLASTY;  Surgeon: Newt Minion, MD;  Location: [facility];  Service: Orthopedics;  Laterality: Right;  Right Total Knee Arthroplasty   TUBAL LIGATION  1996   Patient Active Problem List   Diagnosis Date Noted   Lactic acidosis 12/18/2021   Dyspnea on exertion 12/18/2021   Anemia 12/18/2021   Gout 12/18/2021   Acute pain of left shoulder    Arthritis of left shoulder region    S/P reverse total shoulder arthroplasty, left 11/16/2021   Unilateral primary osteoarthritis, right hip 05/09/2021   S/P lumbar spinal fusion 05/09/2021   S/P insertion of spinal cord stimulator 05/09/2021   H/O total knee replacement, bilateral 01/18/2021   Lumbar post-laminectomy syndrome 04/06/2020   Long-term current use of opiate analgesic 11/04/2019   Leg pain, right 10/20/2019   Dysesthesia 10/20/2019   Lateral femoral cutaneous neuropathy, 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 difficile colitis 05/22/2019   Hypophosphatemia 05/21/2019   Syncope 05/20/2019   OSA on CPAP    Acute gastroenteritis    Anemia due to blood loss, acute 06/18/2018    Class: Acute   Plantar fasciitis of left foot 06/16/2018    Class: Chronic   Tendonitis, Achilles, right 06/16/2018    Class: Chronic   Osteochondral talar dome lesion 06/16/2018    Class: Chronic   Deep incisional surgical site infection    Superficial incisional surgical site infection 06/11/2018    Class: Acute   Right ankle effusion 06/11/2018    Class: Acute   Morbid (severe) obesity due to excess calories ([place]) 05/29/2018    Class: Chronic   Spondylolisthesis, lumbar region 05/27/2018    Class: Chronic   Spinal stenosis of lumbar region 05/27/2018    Class: Chronic   Urinary tract infection 05/27/2018    Class: Acute   Fusion of spine of lumbar region 05/27/2018   Pain of right hip joint 07/03/2017   Acute right-sided low back pain with right-sided sciatica  03/27/2017   Chronic right shoulder pain 02/13/2017   History of rotator cuff tear 11/30/2016   Impingement syndrome of right shoulder 11/30/2016   Exacerbation of asthma 07/18/2016   Hypokalemia 07/18/2016   Hypertension 07/18/2016   Depression with anxiety 07/18/2016   Hypothyroidism 07/18/2016   Hyperglycemia 07/18/2016   Asthma, chronic 07/18/2016   Surgical wound dehiscence left hip; questionable superficial infection 11/10/2015   Postoperative wound infection 11/10/2015   Osteoarthritis of left hip 09/09/2015   Status post total replacement of left hip 09/09/2015   Obesity (BMI 35.0-39.9 without comorbidity) 04/28/2013      REFERRING PROVIDER: Basil Dess MD  REFERRING DIAG: Degenerative disc, lumbar  Rationale for Evaluation and Treatment: Rehabilitation  THERAPY DIAG:  Other low back pain  Other muscle spasm  ONSET DATE: Ongoing with severe exacerbation in May/June.  SUBJECTIVE:                                                                                                                                                                                           SUBJECTIVE STATEMENT: Patient reports that she had to go to the emergency room yesterday due to her pain. She notes that she was in tears due to her pain. She was given an injection and lidocaine patch which seemed to help "for the moment." She is scheduled to get an MRI on 08/08/22. She notes that her feet have been swollen since last Thursday. She had an ultrasound, but this showed no blood clots and they did not know why this was happening.   PERTINENT HISTORY:  Lumbar fusion, spinal stimulator.  PAIN:  Are you having pain? Yes: NPRS scale: 5/10 Pain location: Lumbar Pain description: Ache, sore, throbbing, shooting Aggravating factors: As above. Relieving factors: As above.   WEIGHT BEARING RESTRICTIONS: No  FALLS:  Has patient fallen in last 6 months? Yes. Number of falls 2  LIVING  ENVIRONMENT: Lives in: House/apartment Has following equipment at home: Single point cane  OCCUPATION: Disabled.  PLOF: Independent with basic ADLs  PATIENT GOALS: Reduce pain especially with motion.   OBJECTIVE:      TODAY'S TREATMENT:                                                                                                                              DATE:  Manual Therapy Soft Tissue Mobilization: bilateral lumbar paraspinals and QL, for reduced pain and tone Joint Mobilizations: grade I-II lumbar CPA's , for reduced pain    ASSESSMENT:  CLINICAL IMPRESSION: Treatment focused on manual therapy due to increased pain since her last appointment. Soft tissue mobilization to the right QL and lumbar paraspinals were the most effective at reducing her familiar pain as she reported feeling better  upon the conclusion of treatment. She continues to require skilled physical therapy to address her remaining impairments to maximize her functional mobility.    GOALS:  LONG TERM GOALS: Target date: 08/29/2022  Ind with an HEP. Baseline:  Goal status: INITIAL  2.  Perform basic ADL's with pain not > 4-5/10. Baseline:  Goal status: INITIAL  3.  Walk with pain not > 4/10. Baseline:  Goal status: INITIAL  PLAN:  PT FREQUENCY: 2x/week  PT DURATION: 6 weeks  PLANNED INTERVENTIONS: Therapeutic exercises, Therapeutic activity, Neuromuscular re-education, Patient/Family education, Self Care, Dry Needling, Electrical stimulation, Cryotherapy, Moist heat, Ultrasound, and Manual therapy.  PLAN FOR NEXT SESSION: FOTO.  Combo e'stim/US, STW/M, dry needling.  Gentle core exercise progression per patient tolerance.   Darlin Coco, PT 07/23/2022, 12:41 PM

## 2022-07-25 ENCOUNTER — Encounter: Payer: Self-pay | Admitting: Physical Therapy

## 2022-07-25 ENCOUNTER — Ambulatory Visit: Payer: Medicare Other | Admitting: Physical Therapy

## 2022-07-25 DIAGNOSIS — M5459 Other low back pain: Secondary | ICD-10-CM | POA: Diagnosis not present

## 2022-07-25 DIAGNOSIS — M62838 Other muscle spasm: Secondary | ICD-10-CM

## 2022-07-25 NOTE — Therapy (Signed)
OUTPATIENT PHYSICAL THERAPY THORACOLUMBAR TREATMENT   Patient Name: Marilyn [name] MRN: [id number] DOB:[birth year], 51 y.o., female Today's Date: 07/25/2022   PT End of Session - 07/25/22 1122     Visit Number 4    Number of Visits 12    Date for PT Re-Evaluation 10/11/22    Authorization Type FOTO AT LEAST EVERY 5TH VISIT.  PROGRESS NOTE AT 10TH VISIT.  KX MODIFIER AFTER 15 VISITS.    PT Start Time 1122    PT Stop Time 1158    PT Time Calculation (min) 36 min    Activity Tolerance Patient tolerated treatment well    Behavior During Therapy WFL for tasks assessed/performed             Past Medical History:  Diagnosis Date   Anemia    taking iron now. pt states having no current issues 09/02/2015   Anginal pain (HCC)    pt states experiences chest wall pain pt states related to her asthma    Anxiety    with MRI's   Arthritis    Everywhere   Asthma    Depression    Dizziness    GERD (gastroesophageal reflux disease)    Headache(784.0)    HX OF MIGRAINES   History of bronchitis    Hypertension    Hypothyroidism    takes levothyroxen   OSA on CPAP    wears cpap   Shortness of breath    with exertion   Wears glasses    Past Surgical History:  Procedure Laterality Date   APPLICATION OF WOUND VAC  11/16/2021   Procedure: APPLICATION OF WOUND VAC;  Surgeon: Meredith Pel, MD;  Location: [place];  Service: Orthopedics;;   APPLICATION OF WOUND VAC Left 12/01/2021   Procedure: APPLICATION OF WOUND VAC;  Surgeon: Meredith Pel, MD;  Location: [place];  Service: Orthopedics;  Laterality: Left;   BACK SURGERY     CESAREAN SECTION     times 2   CHOLECYSTECTOMY     COLONOSCOPY  2020   ENDOMETRIAL ABLATION     I & D EXTREMITY Left 12/06/2021   Procedure: LEFT SHOULDER DEBRIDEMENT AND WOUND CLOSURE;  Surgeon: Newt Minion, MD;  Location: [place];  Service: Orthopedics;  Laterality: Left;   I & D EXTREMITY Left 12/20/2021   Procedure: LEFT SHOULDER DEBRIDEMENT;   Surgeon: Newt Minion, MD;  Location: [place];  Service: Orthopedics;  Laterality: Left;   I & D EXTREMITY Left 12/22/2021   Procedure: LEFT SHOULDER DEBRIDEMENT;  Surgeon: Newt Minion, MD;  Location: [place];  Service: Orthopedics;  Laterality: Left;   INCISION AND DRAINAGE Left 12/01/2021   Procedure: LEFT SHOULDER IRRIGATION AND EXCISIONAL DEBRIDEMENT;  Surgeon: Meredith Pel, MD;  Location: [place];  Service: Orthopedics;  Laterality: Left;   INCISION AND DRAINAGE HIP Left 11/10/2015   Procedure: IRRIGATION AND DEBRIDEMENT LEFT HIP INCISION;  Surgeon: Mcarthur Rossetti, MD;  Location: [place];  Service: Orthopedics;  Laterality: Left;   JOINT REPLACEMENT  2011   total left knee   KNEE ARTHROPLASTY  04/23/2012   right    KNEE ARTHROSCOPY     LUMBAR LAMINECTOMY/DECOMPRESSION MICRODISCECTOMY N/A 06/23/2019   Procedure: RIGHT LUMBAR FIVE THROUGH SACRAL ONE PARTIAL HEMILAMINECTOMY WITH RIGHT LUMBAR FIVE FORAMINOTOMY;  Surgeon: Jessy Oto, MD;  Location: [place];  Service: Orthopedics;  Laterality: N/A;   LUMBAR WOUND DEBRIDEMENT N/A 06/11/2018   Procedure: LUMBAR WOUND DEBRIDEMENT;  Surgeon: Basil Dess  E, MD;  Location: [place];  Service: Orthopedics;  Laterality: N/A;   RADIOLOGY WITH ANESTHESIA N/A 09/09/2018   Procedure: LUMBER SPINE WITHOUT CONTRAST;  Surgeon: Radiologist, Medication, MD;  Location: [place];  Service: Radiology;  Laterality: N/A;   REVERSE SHOULDER ARTHROPLASTY Left 11/16/2021   Procedure: LEFT REVERSE SHOULDER ARTHROPLASTY;  Surgeon: Meredith Pel, MD;  Location: [place];  Service: Orthopedics;  Laterality: Left;   ROTATOR CUFF REPAIR     left    SHOULDER SURGERY     right to repair ligament tear   TOTAL HIP ARTHROPLASTY Left 09/09/2015   Procedure: LEFT TOTAL HIP ARTHROPLASTY ANTERIOR APPROACH;  Surgeon: Mcarthur Rossetti, MD;  Location: [facility];  Service: Orthopedics;  Laterality: Left;   TOTAL KNEE ARTHROPLASTY  04/23/2012   Procedure: TOTAL KNEE  ARTHROPLASTY;  Surgeon: Newt Minion, MD;  Location: [place];  Service: Orthopedics;  Laterality: Right;  Right Total Knee Arthroplasty   TUBAL LIGATION  1996   Patient Active Problem List   Diagnosis Date Noted   Lactic acidosis 12/18/2021   Dyspnea on exertion 12/18/2021   Anemia 12/18/2021   Gout 12/18/2021   Acute pain of left shoulder    Arthritis of left shoulder region    S/P reverse total shoulder arthroplasty, left 11/16/2021   Unilateral primary osteoarthritis, right hip 05/09/2021   S/P lumbar spinal fusion 05/09/2021   S/P insertion of spinal cord stimulator 05/09/2021   H/O total knee replacement, bilateral 01/18/2021   Lumbar post-laminectomy syndrome 04/06/2020   Long-term current use of opiate analgesic 11/04/2019   Leg pain, right 10/20/2019   Dysesthesia 10/20/2019   Lateral femoral cutaneous neuropathy, 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 difficile colitis 05/22/2019   Hypophosphatemia 05/21/2019   Syncope 05/20/2019   OSA on CPAP    Acute gastroenteritis    Anemia due to blood loss, acute 06/18/2018    Class: Acute   Plantar fasciitis of left foot 06/16/2018    Class: Chronic   Tendonitis, Achilles, right 06/16/2018    Class: Chronic   Osteochondral talar dome lesion 06/16/2018    Class: Chronic   Deep incisional surgical site infection    Superficial incisional surgical site infection 06/11/2018    Class: Acute   Right ankle effusion 06/11/2018    Class: Acute   Morbid (severe) obesity due to excess calories ([place]) 05/29/2018    Class: Chronic   Spondylolisthesis, lumbar region 05/27/2018    Class: Chronic   Spinal stenosis of lumbar region 05/27/2018    Class: Chronic   Urinary tract infection 05/27/2018    Class: Acute   Fusion of spine of lumbar region 05/27/2018   Pain of right hip joint 07/03/2017   Acute right-sided low back pain with right-sided sciatica  03/27/2017   Chronic right shoulder pain 02/13/2017   History of rotator cuff tear 11/30/2016   Impingement syndrome of right shoulder 11/30/2016   Exacerbation of asthma 07/18/2016   Hypokalemia 07/18/2016   Hypertension 07/18/2016   Depression with anxiety 07/18/2016   Hypothyroidism 07/18/2016   Hyperglycemia 07/18/2016   Asthma, chronic 07/18/2016   Surgical wound dehiscence left hip; questionable superficial infection 11/10/2015   Postoperative wound infection 11/10/2015   Osteoarthritis of left hip 09/09/2015   Status post total replacement of left hip 09/09/2015   Obesity (BMI 35.0-39.9 without comorbidity) 04/28/2013      REFERRING PROVIDER: Basil Dess MD  REFERRING DIAG: Degenerative disc, lumbar  Rationale for Evaluation and Treatment: Rehabilitation  THERAPY DIAG:  Other low back pain  Other muscle spasm  ONSET DATE: Ongoing with severe exacerbation in May/June.  SUBJECTIVE:                                                                                                                                                                                           SUBJECTIVE STATEMENT: Having a better day but has rested more recently and had better sleep.  PERTINENT HISTORY:  Lumbar fusion, spinal stimulator.  PAIN:  Are you having pain? Yes: NPRS scale: 4/10 Pain location: Lumbar Pain description: Ache, sore, throbbing, shooting Aggravating factors: As above. Relieving factors: As above.   WEIGHT BEARING RESTRICTIONS: No  FALLS:  Has patient fallen in last 6 months? Yes. Number of falls 2  LIVING ENVIRONMENT: Lives in: House/apartment Has following equipment at home: Single point cane  OCCUPATION: Disabled.  PLOF: Independent with basic ADLs  PATIENT GOALS: Reduce pain especially with motion.   OBJECTIVE:      TODAY'S TREATMENT:                                                                                                                                Manual Therapy Soft Tissue Mobilization: R lumbar paraspinals, QL, to reduce pain and TP    Modalities  Date: 11/8 Ultrasound: Lumbar, 1.5 w/cm2, 100%, 10 mins, Pain  ASSESSMENT:  CLINICAL IMPRESSION: Patient presented in clinic with reports of continued pain. Patient having a better symptom day today but reports sleeping better and resting more yesterday. TP noted in R inferior paraspinals/ upper glute area. Reported soreness over the palpable TP as well. Normal [place] response noted.  GOALS:  LONG TERM GOALS: Target date: 08/29/2022  Ind with an HEP. Baseline:  Goal status: INITIAL  2.  Perform basic ADL's with pain not > 4-5/10. Baseline:  Goal status: INITIAL  3.  Walk with pain not > 4/10. Baseline:  Goal status: INITIAL  PLAN:  PT FREQUENCY: 2x/week  PT DURATION: 6 weeks  PLANNED INTERVENTIONS: Therapeutic exercises, Therapeutic activity, Neuromuscular re-education, Patient/Family education, Self Care, Dry Needling, Electrical stimulation, Cryotherapy, Moist heat, Ultrasound, and Manual therapy.  PLAN FOR NEXT SESSION: FOTO.  Combo e'stim/US, STW/M, dry needling.  Gentle core exercise progression per patient tolerance.   Marvell Fuller, PTA 07/25/2022, 12:20 PM

## 2022-07-26 ENCOUNTER — Encounter: Payer: Medicare Other | Admitting: Physical Medicine and Rehabilitation

## 2022-07-27 ENCOUNTER — Ambulatory Visit (INDEPENDENT_AMBULATORY_CARE_PROVIDER_SITE_OTHER): Payer: Medicare Other | Admitting: Orthopedic Surgery

## 2022-07-27 ENCOUNTER — Encounter: Payer: Self-pay | Admitting: Orthopedic Surgery

## 2022-07-27 VITALS — BP 141/86 | HR 80 | Ht 61.5 in | Wt 362.0 lb

## 2022-07-27 DIAGNOSIS — M4726 Other spondylosis with radiculopathy, lumbar region: Secondary | ICD-10-CM

## 2022-07-27 NOTE — Progress Notes (Signed)
Orthopedic Spine Surgery Office Note  Assessment: Patient is a 51 y.o. female with chronic low back pain with acute exacerbation of worsening low back pain and radiation of pain down the right leg. Radiates on both the anterior and posterior aspect of the leg. Has a spondylolisthesis and disc height loss above prior L4-S1 fusion.    Plan: -Explained that initially conservative treatment is tried as a significant number of patients may experience relief with these treatment modalities. Discussed that the conservative treatments include:  -activity modification  -physical therapy  -over the counter pain medications  -medrol dosepak  -lumbar steroid injections -Patient has tried all of the above treatments over the years and is in pain management getting narcotic medications to help with the pain as well -She has an MRI of the lumbar spine already ordered so I will see her back after that to discuss the results of that -I explained that surgery would not be considered until her BMI is 40 or less. She will have to continue with conservative treatments until that time due to the risks associated with surgery with her current weight. I will continue to follow her exam and the plan may change if her exam changes -Patient should return to office in 6 weeks, repeat x-rays of lumbar spine at next visit: none   Patient expressed understanding of the plan and all questions were answered to the patient's satisfaction.   ___________________________________________________________________________   History:  Patient is a 51 y.o. female who presents today for lumbar spine.  Patient has a long history of low back pain and is established with pain management.  She has been able to deal with most of her chronic low back pain, but has recently had increasing severity of her low back pain that radiates down her right leg.  She says she feels on the anterior and posterior aspect of the leg mostly in the thigh.   She does not have any symptoms on her left leg.  There is no trauma or injury that brought on the pain.   Weakness: Feels weaker in her legs but no other weakness Symptoms of imbalance: Denies Paresthesias and numbness: Denies Bowel or bladder incontinence: Denies Saddle anesthesia: Denies  Treatments tried: Tylenol, NSAIDs, physical therapy, steroid pills, steroid injections, pain management  Review of systems: Denies fevers and chills, night sweats, unexplained weight loss, history of cancer.  Has had pain that wakes her at night  Past medical history: Hypertension Depression Anxiety GERD Rheumatoid arthritis Sleep apnea Chronic pain Hypothyroidism Anemia The Allergies: Lisinopril, bupropion, latex, propoxyphene, methadone, meloxicam, codeine, morphine, Tegaderm  Past surgical history:  Bilateral TKA Left THA Left shoulder replacement  L4-S1 posterior spinal fusion with TLIF's Spinal cord stimulator placement Tubal ligation Cholecystectomy Cesarean section Multiple I&D's  Social history: Denies use of nicotine product (smoking, vaping, patches, smokeless) Alcohol use: Denies Denies recreational drug use   Physical Exam:  General: no acute distress, appears stated age Neurologic: alert, answering questions appropriately, following commands Respiratory: unlabored breathing on room air, symmetric chest rise Psychiatric: appropriate affect, normal cadence to speech   MSK (spine):  -Strength exam      Left  Right EHL    5/5  5/5 TA    5/5  5/5 GSC    5/5  5/5 Knee extension  5/5  5/5 Hip flexion   5/5  5/5  -Sensory exam    Sensation intact to light touch in L3-S1 nerve distributions of bilateral lower extremities  -Straight leg raise:  Negative -Contralateral straight leg raise: Negative -Clonus: no beats bilaterally  -Left hip exam: No pain through range of motion, negative Stinchfield -Right hip exam: Some pain with internal rotation, no pain  through remainder of range of motion,, positive Stinchfield  Imaging: XR of the lumbar spine from 06/29/2022 and 07/27/2022 was independently reviewed and interpreted, showing spondylolisthesis at L3/4 above her prior fusion - shifts less than 76mm between flexion and extension. No evidence of complication from her [id number] PSIF and interbody fusions. Spinal cord stimulator seen as well. No fracture or dislocation.    Patient name: Marilyn [name] Patient MRN: [id number] Date of visit: 07/27/22

## 2022-07-30 ENCOUNTER — Ambulatory Visit: Payer: Medicare Other | Admitting: Physical Therapy

## 2022-08-01 ENCOUNTER — Encounter: Payer: Self-pay | Admitting: Physical Therapy

## 2022-08-01 ENCOUNTER — Ambulatory Visit: Payer: Medicare Other | Admitting: Physical Therapy

## 2022-08-01 DIAGNOSIS — M5459 Other low back pain: Secondary | ICD-10-CM | POA: Diagnosis not present

## 2022-08-01 DIAGNOSIS — M62838 Other muscle spasm: Secondary | ICD-10-CM

## 2022-08-01 NOTE — Therapy (Signed)
OUTPATIENT PHYSICAL THERAPY THORACOLUMBAR TREATMENT   Patient Name: Marilyn [name] MRN: [id number] DOB:01/18/1971, 51 y.o., female Today's Date: 08/01/2022   PT End of Session - 08/01/22 1207     Visit Number 5    Number of Visits 12    Date for PT Re-Evaluation 10/11/22    Authorization Type FOTO AT LEAST EVERY 5TH VISIT.  PROGRESS NOTE AT 10TH VISIT.  KX MODIFIER AFTER 15 VISITS.    PT Start Time 1127    PT Stop Time 1223    PT Time Calculation (min) 56 min    Activity Tolerance Patient tolerated treatment well    Behavior During Therapy WFL for tasks assessed/performed             Past Medical History:  Diagnosis Date   Anemia    taking iron now. pt states having no current issues 09/02/2015   Anginal pain (HCC)    pt states experiences chest wall pain pt states related to her asthma    Anxiety    with MRI's   Arthritis    Everywhere   Asthma    Depression    Dizziness    GERD (gastroesophageal reflux disease)    Headache(784.0)    HX OF MIGRAINES   History of bronchitis    Hypertension    Hypothyroidism    takes levothyroxen   OSA on CPAP    wears cpap   Shortness of breath    with exertion   Wears glasses    Past Surgical History:  Procedure Laterality Date   APPLICATION OF WOUND VAC  11/16/2021   Procedure: APPLICATION OF WOUND VAC;  Surgeon: Cammy Copa, MD;  Location: [facility];  Service: Orthopedics;;   APPLICATION OF WOUND VAC Left 12/01/2021   Procedure: APPLICATION OF WOUND VAC;  Surgeon: Cammy Copa, MD;  Location: [facility];  Service: Orthopedics;  Laterality: Left;   BACK SURGERY     CESAREAN SECTION     times 2   CHOLECYSTECTOMY     COLONOSCOPY  2020   ENDOMETRIAL ABLATION     I & D EXTREMITY Left 12/06/2021   Procedure: LEFT SHOULDER DEBRIDEMENT AND WOUND CLOSURE;  Surgeon: [name], MD;  Location: [facility];  Service: Orthopedics;  Laterality: Left;   I & D EXTREMITY Left 12/20/2021   Procedure: LEFT SHOULDER DEBRIDEMENT;   Surgeon: [name], MD;  Location: [facility] OR;  Service: Orthopedics;  Laterality: Left;   I & D EXTREMITY Left 12/22/2021   Procedure: LEFT SHOULDER DEBRIDEMENT;  Surgeon: [name], MD;  Location: [facility] OR;  Service: Orthopedics;  Laterality: Left;   INCISION AND DRAINAGE Left 12/01/2021   Procedure: LEFT SHOULDER IRRIGATION AND EXCISIONAL DEBRIDEMENT;  Surgeon: Cammy Copa, MD;  Location: [facility] OR;  Service: Orthopedics;  Laterality: Left;   INCISION AND DRAINAGE HIP Left 11/10/2015   Procedure: IRRIGATION AND DEBRIDEMENT LEFT HIP INCISION;  Surgeon: Kathryne Hitch, MD;  Location: [facility];  Service: Orthopedics;  Laterality: Left;   JOINT REPLACEMENT  2011   total left knee   KNEE ARTHROPLASTY  04/23/2012   right    KNEE ARTHROSCOPY     LUMBAR LAMINECTOMY/DECOMPRESSION MICRODISCECTOMY N/A 06/23/2019   Procedure: RIGHT LUMBAR FIVE THROUGH SACRAL ONE PARTIAL HEMILAMINECTOMY WITH RIGHT LUMBAR FIVE FORAMINOTOMY;  Surgeon: Kerrin Champagne, MD;  Location: [facility];  Service: Orthopedics;  Laterality: N/A;   LUMBAR WOUND DEBRIDEMENT N/A 06/11/2018   Procedure: LUMBAR WOUND DEBRIDEMENT;  Surgeon: Vira Browns  E, MD;  Location: [place];  Service: Orthopedics;  Laterality: N/A;   RADIOLOGY WITH ANESTHESIA N/A 09/09/2018   Procedure: LUMBER SPINE WITHOUT CONTRAST;  Surgeon: Radiologist, Medication, MD;  Location: [place];  Service: Radiology;  Laterality: N/A;   REVERSE SHOULDER ARTHROPLASTY Left 11/16/2021   Procedure: LEFT REVERSE SHOULDER ARTHROPLASTY;  Surgeon: Meredith Pel, MD;  Location: [place];  Service: Orthopedics;  Laterality: Left;   ROTATOR CUFF REPAIR     left    SHOULDER SURGERY     right to repair ligament tear   TOTAL HIP ARTHROPLASTY Left 09/09/2015   Procedure: LEFT TOTAL HIP ARTHROPLASTY ANTERIOR APPROACH;  Surgeon: Mcarthur Rossetti, MD;  Location: [facility];  Service: Orthopedics;  Laterality: Left;   TOTAL KNEE ARTHROPLASTY  04/23/2012   Procedure: TOTAL KNEE  ARTHROPLASTY;  Surgeon: Newt Minion, MD;  Location: [place];  Service: Orthopedics;  Laterality: Right;  Right Total Knee Arthroplasty   TUBAL LIGATION  1996   Patient Active Problem List   Diagnosis Date Noted   Lactic acidosis 12/18/2021   Dyspnea on exertion 12/18/2021   Anemia 12/18/2021   Gout 12/18/2021   Acute pain of left shoulder    Arthritis of left shoulder region    S/P reverse total shoulder arthroplasty, left 11/16/2021   Unilateral primary osteoarthritis, right hip 05/09/2021   S/P lumbar spinal fusion 05/09/2021   S/P insertion of spinal cord stimulator 05/09/2021   H/O total knee replacement, bilateral 01/18/2021   Lumbar post-laminectomy syndrome 04/06/2020   Long-term current use of opiate analgesic 11/04/2019   Leg pain, right 10/20/2019   Dysesthesia 10/20/2019   Lateral femoral cutaneous neuropathy, 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 difficile colitis 05/22/2019   Hypophosphatemia 05/21/2019   Syncope 05/20/2019   OSA on CPAP    Acute gastroenteritis    Anemia due to blood loss, acute 06/18/2018    Class: Acute   Plantar fasciitis of left foot 06/16/2018    Class: Chronic   Tendonitis, Achilles, right 06/16/2018    Class: Chronic   Osteochondral talar dome lesion 06/16/2018    Class: Chronic   Deep incisional surgical site infection    Superficial incisional surgical site infection 06/11/2018    Class: Acute   Right ankle effusion 06/11/2018    Class: Acute   Morbid (severe) obesity due to excess calories ([place]) 05/29/2018    Class: Chronic   Spondylolisthesis, lumbar region 05/27/2018    Class: Chronic   Spinal stenosis of lumbar region 05/27/2018    Class: Chronic   Urinary tract infection 05/27/2018    Class: Acute   Fusion of spine of lumbar region 05/27/2018   Pain of right hip joint 07/03/2017   Acute right-sided low back pain with right-sided sciatica  03/27/2017   Chronic right shoulder pain 02/13/2017   History of rotator cuff tear 11/30/2016   Impingement syndrome of right shoulder 11/30/2016   Exacerbation of asthma 07/18/2016   Hypokalemia 07/18/2016   Hypertension 07/18/2016   Depression with anxiety 07/18/2016   Hypothyroidism 07/18/2016   Hyperglycemia 07/18/2016   Asthma, chronic 07/18/2016   Surgical wound dehiscence left hip; questionable superficial infection 11/10/2015   Postoperative wound infection 11/10/2015   Osteoarthritis of left hip 09/09/2015   Status post total replacement of left hip 09/09/2015   Obesity (BMI 35.0-39.9 without comorbidity) 04/28/2013      REFERRING PROVIDER: Basil Dess MD  REFERRING DIAG: Degenerative disc, lumbar  Rationale for Evaluation and Treatment: Rehabilitation  THERAPY DIAG:  Other low back pain  Other muscle spasm  ONSET DATE: Ongoing with severe exacerbation in May/June.  SUBJECTIVE:                                                                                                                                                                                           SUBJECTIVE STATEMENT: Pain at a 5 on right. PERTINENT HISTORY:  Lumbar fusion, spinal stimulator.  PAIN:  Are you having pain? Yes: NPRS scale: 5/10 Pain location: Lumbar Pain description: Ache, sore, throbbing, shooting Aggravating factors: As above. Relieving factors: As above.    PATIENT GOALS: Reduce pain especially with motion.   OBJECTIVE:      TODAY'S TREATMENT:                                                                                                                               Manual Therapy STW/M x 11 minutes to right gluteal region and Piriformis with ischemic release technique to decrease pain and tone.  Modalities  Combo e'stim/US at 1.50 W/CM2 x 12 minutes to patient's right gluteal region f/b HMP and IFC at 80-150 Hz on 40% scan x 20  minutes.  ASSESSMENT:  CLINICAL IMPRESSION: Patient's right piriformis very tender to palpation.  Good response to STW/M which included ischemic release technique to decrease tone and pain. Normal modality response following removal of modality.  GOALS:  LONG TERM GOALS: Target date: 08/29/2022  Ind with an HEP. Baseline:  Goal status: INITIAL  2.  Perform basic ADL's with pain not > 4-5/10. Baseline:  Goal status: INITIAL  3.  Walk with pain not > 4/10. Baseline:  Goal status: INITIAL  PLAN:  PT FREQUENCY: 2x/week  PT DURATION: 6 weeks  PLANNED INTERVENTIONS: Therapeutic exercises, Therapeutic activity, Neuromuscular re-education, Patient/Family education, Self Care, Dry Needling, Electrical stimulation, Cryotherapy, Moist heat, Ultrasound, and Manual therapy.  PLAN FOR NEXT SESSION: FOTO.  Combo e'stim/US, STW/M, dry needling.  Gentle core exercise progression per patient tolerance.  Anaisa Radi, [place], PT 08/01/2022, 12:30 PM

## 2022-08-05 ENCOUNTER — Encounter: Payer: Self-pay | Admitting: Orthopedic Surgery

## 2022-08-06 MED ORDER — PREGABALIN 75 MG PO CAPS: 75.0000 mg | ORAL_CAPSULE | Freq: Two times a day (BID) | ORAL | 1 refills | Status: DC

## 2022-08-06 NOTE — Telephone Encounter (Signed)
Rx refill.  Please advise on message.  Thank you.

## 2022-08-06 NOTE — Telephone Encounter (Signed)
Called and left a VM advising patient that Rx refill was sent to [facility] pharmacy.

## 2022-08-07 ENCOUNTER — Other Ambulatory Visit: Payer: Self-pay | Admitting: Radiology

## 2022-08-07 ENCOUNTER — Encounter: Payer: Medicare Other | Admitting: Physical Therapy

## 2022-08-07 MED ORDER — PREGABALIN 75 MG PO CAPS: 75.0000 mg | ORAL_CAPSULE | Freq: Two times a day (BID) | ORAL | 1 refills | Status: DC

## 2022-08-08 ENCOUNTER — Encounter: Payer: Medicare Other | Admitting: Physical Therapy

## 2022-09-03 ENCOUNTER — Encounter: Payer: Self-pay | Admitting: Orthopedic Surgery

## 2022-09-07 ENCOUNTER — Ambulatory Visit (INDEPENDENT_AMBULATORY_CARE_PROVIDER_SITE_OTHER): Payer: Medicare Other | Admitting: Orthopedic Surgery

## 2022-09-07 VITALS — Wt 366.8 lb

## 2022-09-07 DIAGNOSIS — M4726 Other spondylosis with radiculopathy, lumbar region: Secondary | ICD-10-CM | POA: Diagnosis not present

## 2022-09-07 NOTE — Progress Notes (Signed)
Orthopedic Spine Surgery Office Note  Assessment: Patient is a 51 y.o. female with chronic low back pain.pain radiates into bilateral legs.  Feels it on the posterior aspect of the left leg and over the anterior and posterior aspects of the right leg.  Has a spondylolisthesis and disc height loss above prior L4-S1 fusion.     Plan: -Explained that initially conservative treatment is tried as a significant number of patients may experience relief with these treatment modalities. Discussed that the conservative treatments include:  -activity modification  -physical therapy  -over the counter pain medications  -medrol dosepak  -lumbar steroid injections -Patient has tried all of the above treatments over the years and is in pain management getting narcotic medications to help with the pain as well  -Talked about trying an injection but she just had one within the last three months and it did not give her much relief -She still needs to work on weight loss. Her BMI today is 68.  Explained that weight loss would help some of her pain.  It would not completely eliminate it.  Just to set expectations, I told her that I have a normal BMI cutoff of 35 before any elective surgery.  If she is able to demonstrate effort towards weight loss and get down to a BMI of 40, I would consider surgery.  I would need an MRI of her lumbar spine before any surgery though to make a diagnosis.  She is exploring bariatric surgery at this time. -Patient should return to office in 12 weeks, x-rays at next visit: none   Patient expressed understanding of the plan and all questions were answered to the patient's satisfaction.   ___________________________________________________________________________  History: Patient is a 51 y.o. female who has been previously seen in the office for symptoms consistent with lumbar radiculopathy. Since the last visit, symptoms have become worse.  She is having more back pain.  There is no  trauma or injury that caused her worsening pain.  She feels pain rating down her right leg.  She feels it on the anterior and posterior aspect of the leg all the way down to the foot.  She also has pain rating down the posterior aspect of her left leg.  This goes down to the foot as well.  She has not noticed any weakness.  No changes in bowel or bladder habits. No saddle anesthesia.   Previous treatments: Tylenol, NSAIDs, physical therapy, steroid pills, steroid injections, pain management   COPY OF FIRST NOTE Patient is a 51 y.o. female who presents today for lumbar spine.  Patient has a long history of low back pain and is established with pain management.  She has been able to deal with most of her chronic low back pain, but has recently had increasing severity of her low back pain that radiates down her right leg.  She says she feels on the anterior and posterior aspect of the leg mostly in the thigh.  She does not have any symptoms on her left leg.  There is no trauma or injury that brought on the pain.     Weakness: Feels weaker in her legs but no other weakness Symptoms of imbalance: Denies Paresthesias and numbness: Denies Bowel or bladder incontinence: Denies Saddle anesthesia: Denies END OF COPY  Physical Exam:  General: no acute distress, appears stated age Neurologic: alert, answering questions appropriately, following commands Respiratory: unlabored breathing on room air, symmetric chest rise Psychiatric: appropriate affect, normal cadence to speech  MSK (spine):  -Strength exam      Left  Right EHL    5/5  5/5 TA    5/5  5/5 GSC    5/5  5/5 Knee extension  5/5  5/5 Hip flexion   5/5  5/5  -Sensory exam    Sensation intact to light touch in L3-S1 nerve distributions of bilateral lower extremities  -Straight leg raise: negative -Contralateral straight leg raise: negative -Clonus: no beats bilaterally  Imaging: XR of the lumbar spine from 06/29/2022 and  07/27/2022 was previously independently reviewed and interpreted, showing spondylolisthesis at L3/4 above her prior fusion - shifts less than 91mm between flexion and extension. No evidence of complication from her L4-S1 PSIF and interbody fusions. Spinal cord stimulator seen as well. No fracture or dislocation.     Patient name: Marilyn [name] Patient MRN: [id number] Date of visit: 09/07/22

## 2022-10-12 ENCOUNTER — Emergency Department (HOSPITAL_COMMUNITY)
Admission: EM | Admit: 2022-10-12 | Discharge: 2022-10-12 | Disposition: A | Discharge status: Home / Self Care | Payer: 59 | Attending: Emergency Medicine | Admitting: Emergency Medicine

## 2022-10-12 DIAGNOSIS — R600 Localized edema: Secondary | ICD-10-CM | POA: Diagnosis not present

## 2022-10-12 DIAGNOSIS — Z91040 Latex allergy status: Secondary | ICD-10-CM | POA: Diagnosis not present

## 2022-10-12 DIAGNOSIS — E039 Hypothyroidism, unspecified: Secondary | ICD-10-CM | POA: Insufficient documentation

## 2022-10-12 DIAGNOSIS — Z79899 Other long term (current) drug therapy: Secondary | ICD-10-CM | POA: Insufficient documentation

## 2022-10-12 DIAGNOSIS — I10 Essential (primary) hypertension: Secondary | ICD-10-CM | POA: Diagnosis not present

## 2022-10-12 DIAGNOSIS — R609 Edema, unspecified: Secondary | ICD-10-CM

## 2022-10-12 DIAGNOSIS — R2243 Localized swelling, mass and lump, lower limb, bilateral: Secondary | ICD-10-CM | POA: Diagnosis present

## 2022-10-12 LAB — CBC WITH DIFFERENTIAL/PLATELET
Abs Immature Granulocytes: 0.01 10*3/uL (ref 0.00–0.07)
Basophils Absolute: 0 10*3/uL (ref 0.0–0.1)
Basophils Relative: 0 %
Eosinophils Absolute: 0.4 10*3/uL (ref 0.0–0.5)
Eosinophils Relative: 7 %
HCT: 34.2 % — ABNORMAL LOW (ref 36.0–46.0)
Hemoglobin: 10.6 g/dL — ABNORMAL LOW (ref 12.0–15.0)
Immature Granulocytes: 0 %
Lymphocytes Relative: 35 %
Lymphs Abs: 1.9 10*3/uL (ref 0.7–4.0)
MCH: 28 pg (ref 26.0–34.0)
MCHC: 31 g/dL (ref 30.0–36.0)
MCV: 90.2 fL (ref 80.0–100.0)
Monocytes Absolute: 0.3 10*3/uL (ref 0.1–1.0)
Monocytes Relative: 5 %
Neutro Abs: 2.9 10*3/uL (ref 1.7–7.7)
Neutrophils Relative %: 53 %
Platelets: 249 10*3/uL (ref 150–400)
RBC: 3.79 MIL/uL — ABNORMAL LOW (ref 3.87–5.11)
RDW: 15.3 % (ref 11.5–15.5)
WBC: 5.5 10*3/uL (ref 4.0–10.5)
nRBC: 0 % (ref 0.0–0.2)

## 2022-10-12 LAB — COMPREHENSIVE METABOLIC PANEL
ALT: 31 U/L (ref 0–44)
AST: 38 U/L (ref 15–41)
Albumin: 3.6 g/dL (ref 3.5–5.0)
Alkaline Phosphatase: 76 U/L (ref 38–126)
Anion gap: 11 (ref 5–15)
BUN: 16 mg/dL (ref 6–20)
CO2: 29 mmol/L (ref 22–32)
Calcium: 9.1 mg/dL (ref 8.9–10.3)
Chloride: 98 mmol/L (ref 98–111)
Creatinine, Ser: 1.26 mg/dL — ABNORMAL HIGH (ref 0.44–1.00)
GFR, Estimated: 51 mL/min — ABNORMAL LOW (ref >60)
Glucose, Bld: 94 mg/dL (ref 70–99)
Potassium: 3.5 mmol/L (ref 3.5–5.1)
Sodium: 138 mmol/L (ref 135–145)
Total Bilirubin: 0.6 mg/dL (ref 0.3–1.2)
Total Protein: 7.6 g/dL (ref 6.5–8.1)

## 2022-10-12 NOTE — Discharge Instructions (Addendum)
If you develop leg pain, weakness, numbness, inability to walk, chest pain, shortness of breath, or any other new/concerning symptoms then return to the ER or call 911.

## 2022-10-12 NOTE — ED Notes (Signed)
Patient complains of bilateral leg swelling for the last week. Patient is able to ambulate into room

## 2022-10-12 NOTE — ED Triage Notes (Signed)
Pt to ED c/o bilat leg swelling x 2 weeks, being followed by cardiology, recent echo completed. Denies chest pain, ambulatory in triage.

## 2022-10-12 NOTE — ED Provider Notes (Signed)
[facility] Provider Note   CSN: [id number] Arrival date & time: 10/12/22  1423     History  Chief Complaint  Patient presents with   Leg Swelling    Marilyn [name] is a 52 y.o. female.  HPI 52 year old female with a history of hypertension, obesity, OSA, hypothyroidism presents with leg swelling.  She has had progressive bilateral leg swelling for about 2 weeks.  No real pain though she presents today because she feels like it is hard to bend her toes due to the edema.  She is chronically on Lasix which she takes twice daily though she cannot remember how strong it is.  She has chronic dyspnea on exertion that she attributes to asthma that she states is not new or worse or different.  There is no chest pain.  No orthopnea.  The leg swelling is bilateral and she feels like from her feet to her inferior knees.  No recent change in her meds.  Home Medications Prior to Admission medications   Medication Sig Start Date End Date Taking? Authorizing Provider  albuterol (VENTOLIN HFA) 108 (90 Base) MCG/ACT inhaler Inhale 1-2 puffs into the lungs every 6 (six) hours as needed for wheezing or shortness of breath. 12/09/19   Henderly, Britni A, PA-C  allopurinol (ZYLOPRIM) 100 MG tablet Take 1 tablet (100 mg total) by mouth 2 (two) times daily. 02/13/22 05/14/22  Jessy Oto, MD  ammonium lactate (LAC-HYDRIN) 12 % lotion Apply 1 application. topically 2 (two) times daily as needed for dry skin. 07/17/21   [provider]  ascorbic Acid (VITAMIN C) 500 MG CPCR Take 500 mg by mouth daily. Gummy    [provider]  buPROPion (WELLBUTRIN XL) 300 MG 24 hr tablet Take 300 mg by mouth daily. 06/29/20   [provider]  cetirizine (ZYRTEC) 10 MG tablet Take 10 mg by mouth daily.    [provider]  cycloSPORINE (RESTASIS) 0.05 % ophthalmic emulsion Place 1 drop into both eyes 2 (two) times daily as needed (dry eyes).     [provider]  DULoxetine (CYMBALTA) 60 MG capsule Take 60 mg by mouth 2 (two) times daily. 05/02/21   [provider]  Ferrous Gluconate 324 (37.5 Fe) MG TABS Take 324 mg by mouth daily. 02/14/20   [provider]  furosemide (LASIX) 20 MG tablet Take 20 mg by mouth daily.    [provider]  hydrochlorothiazide (HYDRODIURIL) 25 MG tablet Take 25 mg by mouth daily. 09/15/21   [provider]  levothyroxine (SYNTHROID) 150 MCG tablet Take 150 mcg by mouth daily before breakfast. 05/18/19   [provider]  oxybutynin (DITROPAN XL) 15 MG 24 hr tablet Take 15 mg by mouth daily.  05/31/17   [provider]  oxyCODONE-acetaminophen (PERCOCET/ROXICET) 5-325 MG tablet Take 1 tablet by mouth every 6 (six) hours as needed for severe pain. 07/06/22   Truddie Hidden, MD  predniSONE (STERAPRED UNI-PAK 21 TAB) 10 MG (21) TBPK tablet 10mg  Tabs, 6 day taper. Use as directed 07/06/22   Truddie Hidden, MD  pregabalin (LYRICA) 75 MG capsule Take 1 capsule (75 mg total) by mouth 2 (two) times daily. 08/07/22   Callie Fielding, MD  vitamin B-12 (CYANOCOBALAMIN) 250 MCG tablet Take 250 mcg by mouth daily.    [provider]  Vitamin D, Ergocalciferol, (DRISDOL) 1.25 MG (50000 UNIT) CAPS capsule Take 50,000 Units by mouth every Monday.  [provider]      Allergies    Lisinopril, Bee venom, Bupropion, Latex, Propoxyphene, Methadone, Codeine, Meloxicam, Morphine and related, Tegaderm ag mesh [silver], and Tomato    Review of Systems   Review of Systems  Constitutional:  Negative for fever.  Respiratory:  Positive for shortness of breath (chronic, unchanged).   Cardiovascular:  Positive for leg swelling. Negative for chest pain.  Gastrointestinal:  Negative for abdominal pain.  Neurological:  Negative for weakness and numbness.    Physical Exam Updated Vital Signs BP (!) 159/71 (BP Location: Right Arm)   Pulse 66    Temp 98.3 F (36.8 C) (Oral)   Resp 16   Ht 5\' 1"  (1.549 m)   Wt (!) 172 kg   LMP  (LMP Unknown) Comment: tubal ligation  SpO2 100%   BMI 71.66 kg/m  Physical Exam Vitals and nursing note reviewed.  Constitutional:      Appearance: She is well-developed. She is obese.  HENT:     Head: Normocephalic and atraumatic.  Cardiovascular:     Rate and Rhythm: Normal rate and regular rhythm.     Pulses:          Dorsalis pedis pulses are 2+ on the right side and 2+ on the left side.     Heart sounds: Normal heart sounds.  Pulmonary:     Effort: Pulmonary effort is normal.     Breath sounds: Normal breath sounds. No wheezing, rhonchi or rales.  Abdominal:     Palpations: Abdomen is soft.     Tenderness: There is no abdominal tenderness.  Musculoskeletal:     Right lower leg: Edema present.     Left lower leg: Edema present.     Comments: Some pitting edema to bilateral feet going up to lower legs beneath knees. Mild pitting  Skin:    General: Skin is warm and dry.  Neurological:     Mental Status: She is alert.     ED Results / Procedures / Treatments   Labs (all labs ordered are listed, but only abnormal results are displayed) Labs Reviewed  COMPREHENSIVE METABOLIC PANEL - Abnormal; Notable for the following components:      Result Value   Creatinine, Ser 1.26 (*)    GFR, Estimated 51 (*)    All other components within normal limits  CBC WITH DIFFERENTIAL/PLATELET - Abnormal; Notable for the following components:   RBC 3.79 (*)    Hemoglobin 10.6 (*)    HCT 34.2 (*)    All other components within normal limits    EKG None  Radiology No results found.  Procedures Procedures    Medications Ordered in ED Medications - No data to display  ED Course/ Medical Decision Making/ A&P                             Medical Decision Making Amount and/or Complexity of Data Reviewed External Data Reviewed: radiology and notes.    Details: Echo from 2 days ago showed no  systolic or diastolic CHF. Labs: ordered.    Details: No AKI, LFT abnormalities, or new anemia   Patient presents with what seems to be progressively worsening edema.  Given no CHF on the echo from 2 days ago I do not think this is cardiogenic.  I do not think increasing or changing her Lasix will help.  Thus, have discussed compression stockings though she states that her legs are  too big for compression stockings.  We discussed other measures such as tighter socks versus elevating her legs.  However with no other symptoms I do not think CHF is likely and I do not think she needs admission or further workup/treatment.  Will discharge home with return precautions.        Final Clinical Impression(s) / ED Diagnoses Final diagnoses:  Peripheral edema    Rx / DC Orders ED Discharge Orders     None         Sherwood Gambler, MD 10/12/22 2300

## 2022-12-06 ENCOUNTER — Ambulatory Visit (INDEPENDENT_AMBULATORY_CARE_PROVIDER_SITE_OTHER): Payer: 59 | Admitting: Orthopedic Surgery

## 2022-12-06 DIAGNOSIS — M4316 Spondylolisthesis, lumbar region: Secondary | ICD-10-CM

## 2022-12-06 NOTE — Progress Notes (Addendum)
Orthopedic Spine Surgery Office Note   Assessment: Patient is a 52 y.o. female with chronic low back pain that radiates into bilateral legs.  Feels it on the posterior aspect of the left leg and over the anterior and posterior aspects of the right leg.  Has a spondylolisthesis and disc height loss above prior L4-S1 fusion.       Plan: -Explained that initially conservative treatment is tried as a significant number of patients may experience relief with these treatment modalities. Discussed that the conservative treatments include:             -activity modification             -physical therapy             -over the counter pain medications             -medrol dosepak             -lumbar steroid injections -Patient has tried all of the above treatments over the years and is in pain management getting narcotic medications to help with the pain -She still needs to work on weight loss. Reiterated that weight loss would help some of her pain.  It would not completely eliminate it.  Just to set expectations, I told her that I have a normal BMI cutoff of 35 before any elective surgery.  If she is able to demonstrate effort towards weight loss and get down to a BMI of 40, I would offer surgery as a treatment option.  I would need an MRI of her lumbar spine before any surgery prior to any surgical intervention -She is currently in with bariatric surgery and is not open to surgery at this time because of prior complications with surgery.  So, she is exploring medications for weight loss -Patient should return to office in 12 weeks, x-rays at next visit: AP/lateral/flex/ex lumbar     Patient expressed understanding of the plan and all questions were answered to the patient's satisfaction.    ___________________________________________________________________________   History: Patient is a 52 y.o. female who has been previously seen in the office for symptoms consistent with lumbar radiculopathy. Since  the last visit, symptoms have remained the same.  Still having significant back pain radiating to the bilateral lower extremities.  Feels the pain in the lower extremities along the posterior aspect bilaterally.  Also has pain along the anterior aspect of the right leg in the thigh region.  Pain is felt with activity and without activity.  She feels it on a daily basis.  She is in pain management is currently taking Norco every 6 hours which takes the edge off but does not completely relieve the pain.  Denies paresthesias and numbness.  No bowel or bladder incontinence.  No saddle anesthesia.   Previous treatments: tylenol, NSAIDs, physical therapy, steroid pills, steroid injections, pain management    Physical Exam:   General: no acute distress, appears stated age Neurologic: alert, answering questions appropriately, following commands Respiratory: unlabored breathing on room air, symmetric chest rise Psychiatric: appropriate affect, normal cadence to speech     MSK (spine):   -Strength exam                                                   Left  Right EHL                              5/5                  5/5 TA                                 5/5                  5/5 GSC                             5/5                  5/5 Knee extension            5/5                  5/5 Hip flexion                    5/5                  5/5   -Sensory exam                           Sensation intact to light touch in L3-S1 nerve distributions of bilateral lower extremities   -Straight leg raise: negative -Contralateral straight leg raise: negative -Clonus: no beats bilaterally   Imaging: XR of the lumbar spine from 06/29/2022 and 07/27/2022 was previously independently reviewed and interpreted, showing spondylolisthesis at L3/4 above her prior fusion - shifts less than 37mm between flexion and extension. No evidence of complication from her [id number] PSIF and interbody fusions. Spinal  cord stimulator seen as well. No fracture or dislocation.       Patient name: Marilyn [name] Patient MRN: [id number] Date of visit: 12/06/22

## 2023-01-23 ENCOUNTER — Other Ambulatory Visit (INDEPENDENT_AMBULATORY_CARE_PROVIDER_SITE_OTHER): Payer: 59

## 2023-01-23 ENCOUNTER — Ambulatory Visit (INDEPENDENT_AMBULATORY_CARE_PROVIDER_SITE_OTHER): Payer: 59 | Admitting: Orthopedic Surgery

## 2023-01-23 DIAGNOSIS — M4316 Spondylolisthesis, lumbar region: Secondary | ICD-10-CM

## 2023-01-23 DIAGNOSIS — M545 Low back pain, unspecified: Secondary | ICD-10-CM

## 2023-01-23 MED ORDER — METHYLPREDNISOLONE 4 MG PO TBPK: ORAL_TABLET | ORAL | 0 refills | Status: DC

## 2023-01-23 NOTE — Progress Notes (Addendum)
Orthopedic Spine Surgery Office Note  Assessment: Patient is a 52 y.o. female with chronic low back pain with acute exacerbation after a fall.  She also has pain radiating into the bilateral buttocks.   Plan: -Patient has tried tylenol, NSAIDs, physical therapy, steroid pills, steroid injections, pain management  -Recommended CT scan of the lumbar spine given her midline tenderness and recent trauma -Patient is in pain management so did not recommend any opioid medications.  Prescribed Medrol Dosepak to help with the acute pain -Would need to get down to a BMI of 40 before any elective spine surgery -Patient should return to office in 3 weeks, x-rays at next visit: none   Patient expressed understanding of the plan and all questions were answered to the patient's satisfaction.   ___________________________________________________________________________  History: Patient is a 52 y.o. female who has been previously seen in the office for chronic low back pain radiating into the bilateral legs.  She had been doing better after establishing with pain management.  She comes in today because she had a fall at her house and landed on her back. This happened on 01/19/2023. She noted acute onset of pain in her lower lumbar spine.  She also felt pain radiating into her bilateral buttocks.  She does not have pain radiating past the buttocks.  Pain is severe that she has not been able to do anything besides sit at her house.  She is taking hydrocodone from pain management and that is not touching it.  She has not noticed any weakness.  Denies change in bowel or bladder habits.  No saddle anesthesia.  Previous treatments: tylenol, NSAIDs, physical therapy, steroid pills, steroid injections, pain management    Physical Exam:  General: no acute distress, appears stated age Neurologic: alert, answering questions appropriately, following commands Respiratory: unlabored breathing on room air, symmetric chest  rise Psychiatric: appropriate affect, normal cadence to speech   MSK (spine):  -Strength exam      Left  Right EHL    5/5  5/5 TA    5/5  5/5 GSC    5/5  5/5 Knee extension  5/5  5/5 Hip flexion   5/5  5/5  -Sensory exam    Sensation intact to light touch in L3-S1 nerve distributions of bilateral lower extremities  -Straight leg raise: negative bilaterally -Femoral nerve stretch test: negative bilaterally -Clonus: no beats bilaterally  -Tender to palpation over the midline of the lower lumbar spine.  Tender even with light superficial palpation  Imaging: XR of the lumbar spine from 01/23/2023 was independently reviewed and interpreted, showing L4-S1 on posterior instrumentation and interbody cages in similar position to films from 06/2022.  Screws have not backed out.  There is no lucency around the screws.  No fracture is seen.  No evidence of instability on flexion/extension views.   Patient name: Marilyn [name] Patient MRN: [id number] Date of visit: 01/23/23

## 2023-01-25 ENCOUNTER — Telehealth: Payer: Self-pay | Admitting: Orthopedic Surgery

## 2023-01-25 MED ORDER — DIAZEPAM 5 MG PO TABS: 5.0000 mg | ORAL_TABLET | Freq: Once | ORAL | 0 refills | Status: DC | PRN

## 2023-01-25 NOTE — Telephone Encounter (Signed)
Patient called in requesting Valium for CT she has anxiety to small spaces it is on 05/17

## 2023-02-01 ENCOUNTER — Ambulatory Visit (HOSPITAL_BASED_OUTPATIENT_CLINIC_OR_DEPARTMENT_OTHER)
Admission: RE | Admit: 2023-02-01 | Discharge: 2023-02-01 | Disposition: A | Discharge status: Home / Self Care | Payer: 59 | Source: Ambulatory Visit | Attending: Orthopedic Surgery | Admitting: Orthopedic Surgery

## 2023-02-01 DIAGNOSIS — M545 Low back pain, unspecified: Secondary | ICD-10-CM | POA: Diagnosis present

## 2023-02-04 ENCOUNTER — Ambulatory Visit (INDEPENDENT_AMBULATORY_CARE_PROVIDER_SITE_OTHER): Payer: 59 | Admitting: Surgical

## 2023-02-04 ENCOUNTER — Encounter: Payer: Self-pay | Admitting: Surgical

## 2023-02-04 ENCOUNTER — Other Ambulatory Visit: Payer: Self-pay

## 2023-02-04 ENCOUNTER — Other Ambulatory Visit (INDEPENDENT_AMBULATORY_CARE_PROVIDER_SITE_OTHER): Payer: 59

## 2023-02-04 DIAGNOSIS — M25511 Pain in right shoulder: Secondary | ICD-10-CM

## 2023-02-04 DIAGNOSIS — G8929 Other chronic pain: Secondary | ICD-10-CM

## 2023-02-04 DIAGNOSIS — M19011 Primary osteoarthritis, right shoulder: Secondary | ICD-10-CM

## 2023-02-06 ENCOUNTER — Encounter: Payer: Self-pay | Admitting: Surgical

## 2023-02-06 MED ORDER — METHYLPREDNISOLONE ACETATE 40 MG/ML IJ SUSP
40.0000 mg | INTRAMUSCULAR | Status: AC | PRN
  Administered 2023-02-04: 40 mg via INTRA_ARTICULAR

## 2023-02-06 MED ORDER — BUPIVACAINE HCL 0.5 % IJ SOLN
9.0000 mL | INTRAMUSCULAR | Status: AC | PRN
  Administered 2023-02-04: 9 mL via INTRA_ARTICULAR

## 2023-02-06 MED ORDER — LIDOCAINE HCL 1 % IJ SOLN
5.0000 mL | INTRAMUSCULAR | Status: AC | PRN
  Administered 2023-02-04: 5 mL

## 2023-02-06 NOTE — Progress Notes (Signed)
Office Visit Note   Patient: Marilyn [name]           Date of Birth: 11-Nov-1970           MRN: [id number] Visit Date: 02/04/2023 Requested by: Rebecka Apley, NP [address] [place],  [place] [address] PCP: Rebecka Apley, NP  Subjective: Chief Complaint  Patient presents with   Right Shoulder - Pain    HPI: Marilyn [name] is a 52 y.o. female who presents to the office reporting right shoulder pain.  Patient has a history of left shoulder reverse shoulder arthroplasty that overall is doing well now but required several irrigation and debridement procedures by Dr. August Saucer in by Dr. Lajoyce Corners.  Left shoulder really not having any pain and no drainage at this point.  With the right shoulder, she has history of chronic right shoulder pain due to osteoarthritis.  She had a fall about 3 weeks ago where her right leg gave way and she fell onto her right shoulder.  She initially notes that it was very painful and had difficulty with range of motion but it is approaching back to her baseline at this point.  She is able to actively lift the arm overhead now.  She can use it for activities of daily living.  Takes oxycodone 5 mg chronically for pain management.  No ecchymosis or bruising or cellulitis that she has noticed..                ROS: All systems reviewed are negative as they relate to the chief complaint within the history of present illness.  Patient denies fevers or chills.  Assessment & Plan: Visit Diagnoses:  1. Chronic right shoulder pain     Plan: Patient is a 52 year old female who presents for evaluation of right shoulder pain ever since she had a fall 3 weeks ago.  She has history of severe right glenohumeral osteoarthritis.  Radiographs taken today demonstrate no acute fracture or dislocation.  Seems consistent with acute flareup of her arthritic pain from the traumatic incident that has been approaching back to her baseline at this point 3 weeks out from  the initial injury.  We discussed options available to patient such as letting it continue to improve as it has been without intervention versus cortisone injection.  She would like to try injection.  Under ultrasound guidance, injection successfully administered into the glenohumeral joint.  Patient tolerated procedure well.  Follow-up with the office as needed if pain does not improve.  Follow-Up Instructions: No follow-ups on file.   Orders:  Orders Placed This Encounter  Procedures   XR Shoulder Right   US Guided Needle Placement - No Linked Charges   No orders of the defined types were placed in this encounter.     Procedures: Large Joint Inj: R glenohumeral on 02/04/2023 9:39 AM Indications: diagnostic evaluation and pain Details: 22 G 1.5 in and 3.5 in needle, ultrasound-guided posterior approach  Arthrogram: No  Medications: 9 mL bupivacaine 0.5 %; 40 mg methylPREDNISolone acetate 40 MG/ML; 5 mL lidocaine 1 % Outcome: tolerated well, no immediate complications Procedure, treatment alternatives, risks and benefits explained, specific risks discussed. Consent was given by the patient. Immediately prior to procedure a time out was called to verify the correct patient, procedure, equipment, support staff and site/side marked as required. Patient was prepped and draped in the usual sterile fashion.       Clinical Data: No additional findings.  Objective:  Vital Signs: LMP  (LMP Unknown) Comment: tubal ligation  Physical Exam:  Constitutional: Patient appears well-developed HEENT:  Head: Normocephalic Eyes:EOM are normal Neck: Normal range of motion Cardiovascular: Normal rate Pulmonary/chest: Effort normal Neurologic: Patient is alert Skin: Skin is warm Psychiatric: Patient has normal mood and affect  Ortho Exam: Ortho exam demonstrates right shoulder with 25 degrees X rotation, 65 degrees abduction, 140 degrees forward elevation both passively and actively.  No  significant pain with passive motion of the shoulder until the end of her range of motion.  She has grinding and crepitus that is consistent with arthritis with passive motion of the shoulder.  Intact rotator cuff strength of supra, infra, subscap rated 5/5.  She has axillary nerve intact with deltoid firing.  Intact bicep and tricep strength as well as pronation/supination, EPL, FPL, finger abduction, grip strength testing.  No ecchymosis or skin changes noted.  Specialty Comments:  No specialty comments available.  Imaging: No results found.   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 difficile colitis 05/22/2019   Hypophosphatemia 05/21/2019   Syncope 05/20/2019   OSA on CPAP    Acute gastroenteritis    Anemia due to blood loss, acute 06/18/2018    Class: Acute   Plantar fasciitis of left foot 06/16/2018    Class: Chronic   Tendonitis, Achilles, right 06/16/2018    Class: Chronic   Osteochondral talar dome lesion 06/16/2018    Class: Chronic   Deep incisional surgical site infection    Superficial incisional surgical site infection 06/11/2018    Class: Acute   Right ankle effusion 06/11/2018    Class: Acute    Morbid (severe) obesity due to excess calories (HCC) 05/29/2018    Class: Chronic   Spondylolisthesis, lumbar region 05/27/2018    Class: Chronic   Spinal stenosis of lumbar region 05/27/2018    Class: Chronic   Urinary tract infection 05/27/2018    Class: Acute   Fusion of spine of lumbar region 05/27/2018   Pain of right hip joint 07/03/2017   Acute right-sided low back pain with right-sided sciatica 03/27/2017   Chronic right shoulder pain 02/13/2017   History of rotator cuff tear 11/30/2016   Impingement syndrome of right shoulder 11/30/2016   Exacerbation of asthma 07/18/2016   Hypokalemia 07/18/2016   Hypertension 07/18/2016   Depression with anxiety 07/18/2016   Hypothyroidism 07/18/2016   Hyperglycemia 07/18/2016   Asthma, chronic 07/18/2016   Surgical wound dehiscence left hip; questionable superficial infection 11/10/2015   Postoperative wound infection 11/10/2015   Osteoarthritis of left hip 09/09/2015   Status post total replacement of left hip 09/09/2015   Obesity (BMI 35.0-39.9 without comorbidity) 04/28/2013   Past Medical History:  Diagnosis Date  Anemia    taking iron now. pt states having no current issues 09/02/2015   Anginal pain (HCC)    pt states experiences chest wall pain pt states related to her asthma    Anxiety    with MRI's   Arthritis    Everywhere   Asthma    Depression    Dizziness    GERD (gastroesophageal reflux disease)    Headache(784.0)    HX OF MIGRAINES   History of bronchitis    Hypertension    Hypothyroidism    takes levothyroxen   OSA on CPAP    wears cpap   Shortness of breath    with exertion   Wears glasses     Family History  Problem Relation Age of Onset   Cancer Mother        colon   Epilepsy Mother    Cancer Father        prostate   Diabetes Father    Hypertension Father    Hypertension Maternal Aunt    Diabetes Maternal Aunt    Hypertension Paternal Aunt     Past Surgical History:  Procedure  Laterality Date   APPLICATION OF WOUND VAC  11/16/2021   Procedure: APPLICATION OF WOUND VAC;  Surgeon: Cammy Copa, MD;  Location: [facility] OR;  Service: Orthopedics;;   APPLICATION OF WOUND VAC Left 12/01/2021   Procedure: APPLICATION OF WOUND VAC;  Surgeon: Cammy Copa, MD;  Location: [facility] OR;  Service: Orthopedics;  Laterality: Left;   BACK SURGERY     CESAREAN SECTION     times 2   CHOLECYSTECTOMY     COLONOSCOPY  2020   ENDOMETRIAL ABLATION     I & D EXTREMITY Left 12/06/2021   Procedure: LEFT SHOULDER DEBRIDEMENT AND WOUND CLOSURE;  Surgeon: [name], MD;  Location: [facility] OR;  Service: Orthopedics;  Laterality: Left;   I & D EXTREMITY Left 12/20/2021   Procedure: LEFT SHOULDER DEBRIDEMENT;  Surgeon: [name], MD;  Location: [facility] OR;  Service: Orthopedics;  Laterality: Left;   I & D EXTREMITY Left 12/22/2021   Procedure: LEFT SHOULDER DEBRIDEMENT;  Surgeon: [name], MD;  Location: [facility] OR;  Service: Orthopedics;  Laterality: Left;   INCISION AND DRAINAGE Left 12/01/2021   Procedure: LEFT SHOULDER IRRIGATION AND EXCISIONAL DEBRIDEMENT;  Surgeon: Cammy Copa, MD;  Location: [facility] OR;  Service: Orthopedics;  Laterality: Left;   INCISION AND DRAINAGE HIP Left 11/10/2015   Procedure: IRRIGATION AND DEBRIDEMENT LEFT HIP INCISION;  Surgeon: Kathryne Hitch, MD;  Location: [facility] OR;  Service: Orthopedics;  Laterality: Left;   JOINT REPLACEMENT  2011   total left knee   KNEE ARTHROPLASTY  04/23/2012   right    KNEE ARTHROSCOPY     LUMBAR LAMINECTOMY/DECOMPRESSION MICRODISCECTOMY N/A 06/23/2019   Procedure: RIGHT LUMBAR FIVE THROUGH SACRAL ONE PARTIAL HEMILAMINECTOMY WITH RIGHT LUMBAR FIVE FORAMINOTOMY;  Surgeon: Kerrin Champagne, MD;  Location: [facility] OR;  Service: Orthopedics;  Laterality: N/A;   LUMBAR WOUND DEBRIDEMENT N/A 06/11/2018   Procedure: LUMBAR WOUND DEBRIDEMENT;  Surgeon: Kerrin Champagne, MD;  Location: [facility] OR;  Service: Orthopedics;  Laterality: N/A;   RADIOLOGY  WITH ANESTHESIA N/A 09/09/2018   Procedure: LUMBER SPINE WITHOUT CONTRAST;  Surgeon: Radiologist, Medication, MD;  Location: [facility] OR;  Service: Radiology;  Laterality: N/A;   REVERSE SHOULDER ARTHROPLASTY Left 11/16/2021   Procedure: LEFT REVERSE SHOULDER ARTHROPLASTY;  Surgeon: Cammy Copa, MD;  Location: [facility] OR;  Service:  Orthopedics;  Laterality: Left;   ROTATOR CUFF REPAIR     left    SHOULDER SURGERY     right to repair ligament tear   TOTAL HIP ARTHROPLASTY Left 09/09/2015   Procedure: LEFT TOTAL HIP ARTHROPLASTY ANTERIOR APPROACH;  Surgeon: Kathryne Hitch, MD;  Location: [facility] ORS;  Service: Orthopedics;  Laterality: Left;   TOTAL KNEE ARTHROPLASTY  04/23/2012   Procedure: TOTAL KNEE ARTHROPLASTY;  Surgeon: [name], MD;  Location: [facility] OR;  Service: Orthopedics;  Laterality: Right;  Right Total Knee Arthroplasty   TUBAL LIGATION  1996   Social History   Occupational History   Not on file  Tobacco Use   Smoking status: Former    Packs/day: 0.50    Years: 4.00    Additional pack years: 0.00    Total pack years: 2.00    Types: Cigarettes    Quit date: 09/18/1987    Years since quitting: 35.4   Smokeless tobacco: Never  Vaping Use   Vaping Use: Never used  Substance and Sexual Activity   Alcohol use: No   Drug use: No   Sexual activity: Yes    Birth control/protection: Surgical

## 2023-02-13 ENCOUNTER — Ambulatory Visit (INDEPENDENT_AMBULATORY_CARE_PROVIDER_SITE_OTHER): Payer: 59 | Admitting: Orthopedic Surgery

## 2023-02-13 DIAGNOSIS — M5442 Lumbago with sciatica, left side: Secondary | ICD-10-CM | POA: Diagnosis not present

## 2023-02-13 DIAGNOSIS — M5441 Lumbago with sciatica, right side: Secondary | ICD-10-CM | POA: Diagnosis not present

## 2023-02-13 DIAGNOSIS — G8929 Other chronic pain: Secondary | ICD-10-CM

## 2023-02-13 NOTE — Progress Notes (Signed)
Orthopedic Spine Surgery Office Note  Assessment: Patient is a 52 y.o. female with chronic, stable low back pain with pain rating into bilateral buttocks   Plan: -Patient has tried tylenol, NSAIDs, physical therapy, steroid pills, steroid injections, pain management  -Recommended continued symptomatic treatment with pain management -Patient would need to lose weight prior to any elective spine surgery.  She would need to get down to a BMI of 40 -Patient should return to office in 12 weeks, x-rays at next visit: AP/lateral/flex/ex lumbar   Patient expressed understanding of the plan and all questions were answered to the patient's satisfaction.   ___________________________________________________________________________  History: Patient is a 52 y.o. female who has been previously seen in the office for symptoms of low back pain that radiates into the bilateral buttocks.  The pain became more severe after a fall which I saw her after on 01/23/2023.  Pain and symptoms are the exact same as when I saw her previously.  She has not noticed any weakness in her lower extremities.  Denies numbness and paresthesias into her lower extremities.  Pain starts in her low back and radiates into the buttock and lateral aspect of the hips.  It does not radiate past the level of the hip.  Previous treatments: tylenol, NSAIDs, physical therapy, steroid pills, steroid injections, pain management   Physical Exam:  General: no acute distress, appears stated age Neurologic: alert, answering questions appropriately, following commands Respiratory: unlabored breathing on room air, symmetric chest rise Psychiatric: appropriate affect, normal cadence to speech   MSK (spine):  -Strength exam      Left  Right EHL    5/5  5/5 TA    5/5  5/5 GSC    5/5  5/5 Knee extension  5/5  5/5 Hip flexion   5/5  5/5  -Sensory exam    Sensation intact to light touch in L3-S1 nerve distributions of bilateral lower  extremities  -Straight leg raise: Negative bilaterally -Femoral nerve stretch test: Negative bilaterally -Clonus: no beats bilaterally  Imaging: XR of the lumbar spine from 01/23/2023 was previously independently reviewed and interpreted, showing L4-S1 on posterior instrumentation and interbody cages in similar position to films from 06/2022.  Screws have not backed out.  There is no lucency around the screws.  No fracture is seen.  No evidence of instability on flexion/extension views.   CT of the lumbar spine from 02/01/2023 was independently reviewed and interpreted, showing L4-S1 posterior instrumented spinal fusion with interbody devices at those interspaces.  There is no lucency around the interbody devices over the screws.  There appears to be fusion mass from the L4-S1 segments.  No fracture or dislocation seen.   Patient name: MARLON OVER Patient MRN: [id number] Date of visit: 02/13/23

## 2023-02-16 ENCOUNTER — Encounter (HOSPITAL_COMMUNITY): Payer: Self-pay | Admitting: Emergency Medicine

## 2023-02-16 ENCOUNTER — Emergency Department (HOSPITAL_COMMUNITY)
Admission: EM | Admit: 2023-02-16 | Discharge: 2023-02-16 | Disposition: A | Discharge status: Home / Self Care | Payer: 59 | Attending: Student | Admitting: Student

## 2023-02-16 ENCOUNTER — Other Ambulatory Visit: Payer: Self-pay

## 2023-02-16 DIAGNOSIS — Z79899 Other long term (current) drug therapy: Secondary | ICD-10-CM | POA: Insufficient documentation

## 2023-02-16 DIAGNOSIS — Z91040 Latex allergy status: Secondary | ICD-10-CM | POA: Insufficient documentation

## 2023-02-16 DIAGNOSIS — E039 Hypothyroidism, unspecified: Secondary | ICD-10-CM | POA: Diagnosis not present

## 2023-02-16 DIAGNOSIS — Z7951 Long term (current) use of inhaled steroids: Secondary | ICD-10-CM | POA: Insufficient documentation

## 2023-02-16 DIAGNOSIS — I10 Essential (primary) hypertension: Secondary | ICD-10-CM | POA: Diagnosis not present

## 2023-02-16 DIAGNOSIS — J029 Acute pharyngitis, unspecified: Secondary | ICD-10-CM | POA: Diagnosis present

## 2023-02-16 DIAGNOSIS — J4521 Mild intermittent asthma with (acute) exacerbation: Secondary | ICD-10-CM | POA: Insufficient documentation

## 2023-02-16 NOTE — Discharge Instructions (Addendum)
You have been seen today for your complaint of asthma exacerbation. Your discharge medications include nebulizer machine.  I have sent a prescription to [facility], however they will likely not accept this prescription from an ED provider. You may buy a nebulizer on Amazon or contact your PCP for a prescription.  Please seek immediate medical care if you develop any of the following symptoms: You are getting worse and do not respond to treatment during an asthma attack. You are short of breath when at rest or when doing very little physical activity. You have difficulty eating, drinking, or talking. You have chest pain or tightness. You develop a fast heartbeat or palpitations. You have a bluish color to your lips or fingernails. You are light-headed or dizzy, or you faint. Your peak flow reading is less than 50% of your personal best. You feel too tired to breathe normally. At this time there does not appear to be the presence of an emergent medical condition, however there is always the potential for conditions to change. Please read and follow the below instructions.  Do not take your medicine if  develop an itchy rash, swelling in your mouth or lips, or difficulty breathing; call 911 and seek immediate emergency medical attention if this occurs.  You may review your lab tests and imaging results in their entirety on your MyChart account.  Please discuss all results of fully with your primary care provider and other specialist at your follow-up visit.  Note: Portions of this text may have been transcribed using voice recognition software. Every effort was made to ensure accuracy; however, inadvertent computerized transcription errors may still be present.

## 2023-02-16 NOTE — ED Triage Notes (Signed)
Pt c/o sore throat and hoarseness since Tuesday. She went to PCP and received rx for nebs medications but she does not own a nebulizer machine. She is here requesting rx for neb machine and denies new concerns.
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
## 2023-02-28 ENCOUNTER — Ambulatory Visit: Payer: 59 | Admitting: Orthopedic Surgery

## 2023-03-04 ENCOUNTER — Ambulatory Visit (INDEPENDENT_AMBULATORY_CARE_PROVIDER_SITE_OTHER): Payer: 59 | Admitting: Orthopedic Surgery

## 2023-03-04 ENCOUNTER — Encounter: Payer: Self-pay | Admitting: Orthopedic Surgery

## 2023-03-04 DIAGNOSIS — G8929 Other chronic pain: Secondary | ICD-10-CM

## 2023-03-04 DIAGNOSIS — M25511 Pain in right shoulder: Secondary | ICD-10-CM

## 2023-03-04 DIAGNOSIS — M19011 Primary osteoarthritis, right shoulder: Secondary | ICD-10-CM

## 2023-03-04 NOTE — Progress Notes (Signed)
Office Visit Note   Patient: Marilyn [name]           Date of Birth: 1971/06/04           MRN: [id number] Visit Date: 03/04/2023 Requested by: Rebecka Apley, NP [address] [place],  [place] [address] PCP: Rebecka Apley, NP  Subjective: Chief Complaint  Patient presents with   Right Shoulder - Pain    HPI: [name] is a 52 y.o. female who presents to the office reporting right shoulder pain.  Patient had glenohumeral joint injection 02/04/2023.  They gave her 2 weeks of relief.  She has good and bad days.  Ambulates with a cane in the right hand.  Doing reasonly well with her left reverse shoulder replacement.  That 1 is still a little bit stiff.  She is right-hand dominant..                ROS: All systems reviewed are negative as they relate to the chief complaint within the history of present illness.  Patient denies fevers or chills.  Assessment & Plan: Visit Diagnoses:  1. Chronic right shoulder pain   2. Glenohumeral arthritis, right     Plan: Impression is right glenohumeral joint arthritis which is not quite as severe as the left.  She still has pretty reasonable he maintained shoulder range of motion.  Come back in October for further assessment and possible injection at that time.  Follow-Up Instructions: No follow-ups on file.   Orders:  No orders of the defined types were placed in this encounter.  No orders of the defined types were placed in this encounter.     Procedures: No procedures performed   Clinical Data: No additional findings.  Objective: Vital Signs: LMP  (LMP Unknown) Comment: tubal ligation  Physical Exam:  Constitutional: Patient appears well-developed HEENT:  Head: Normocephalic Eyes:EOM are normal Neck: Normal range of motion Cardiovascular: Normal rate Pulmonary/chest: Effort normal Neurologic: Patient is alert Skin: Skin is warm Psychiatric: Patient has normal mood and affect  Ortho  Exam: Ortho exam demonstrates range of motion on the right of 60/90/150.  Does have some coarse grinding and crepitus but good rotator cuff strength infraspinatus supraspinatus and subscap muscle testing with no AC joint tenderness and good range of motion of the cervical spine.  Specialty Comments:  No specialty comments available.  Imaging: No results found.   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 difficile colitis 05/22/2019   Hypophosphatemia 05/21/2019   Syncope 05/20/2019   OSA on CPAP    Acute gastroenteritis    Anemia due to blood loss, acute 06/18/2018    Class: Acute   Plantar fasciitis of left foot  06/16/2018    Class: Chronic   Tendonitis, Achilles, right 06/16/2018    Class: Chronic   Osteochondral talar dome lesion 06/16/2018    Class: Chronic   Deep incisional surgical site infection    Superficial incisional surgical site infection 06/11/2018    Class: Acute   Right ankle effusion 06/11/2018    Class: Acute   Morbid (severe) obesity due to excess calories (HCC) 05/29/2018    Class: Chronic   Spondylolisthesis, lumbar region 05/27/2018    Class: Chronic   Spinal stenosis of lumbar region  05/27/2018    Class: Chronic   Urinary tract infection 05/27/2018    Class: Acute   Fusion of spine of lumbar region 05/27/2018   Pain of right hip joint 07/03/2017   Acute right-sided low back pain with right-sided sciatica 03/27/2017   Chronic right shoulder pain 02/13/2017   History of rotator cuff tear 11/30/2016   Impingement syndrome of right shoulder 11/30/2016   Exacerbation of asthma 07/18/2016   Hypokalemia 07/18/2016   Hypertension 07/18/2016   Depression with anxiety 07/18/2016   Hypothyroidism 07/18/2016   Hyperglycemia 07/18/2016   Asthma, chronic 07/18/2016   Surgical wound dehiscence left hip; questionable superficial infection 11/10/2015   Postoperative wound infection 11/10/2015   Osteoarthritis of left hip 09/09/2015   Status post total replacement of left hip 09/09/2015   Obesity (BMI 35.0-39.9 without comorbidity) 04/28/2013   Past Medical History:  Diagnosis Date   Anemia    taking iron now. pt states having no current issues 09/02/2015   Anginal pain (HCC)    pt states experiences chest wall pain pt states related to her asthma    Anxiety    with MRI's   Arthritis    Everywhere   Asthma    Depression    Dizziness    GERD (gastroesophageal reflux disease)    Headache(784.0)    HX OF MIGRAINES   History of bronchitis    Hypertension    Hypothyroidism    takes levothyroxen   OSA on CPAP    wears cpap   Shortness of breath    with exertion   Wears glasses     Family History  Problem Relation Age of Onset   Cancer Mother        colon   Epilepsy Mother    Cancer Father        prostate   Diabetes Father    Hypertension Father    Hypertension Maternal Aunt    Diabetes Maternal Aunt    Hypertension Paternal Aunt     Past Surgical History:  Procedure Laterality Date   APPLICATION OF WOUND VAC  11/16/2021   Procedure: APPLICATION OF WOUND VAC;  Surgeon: Cammy Copa, MD;  Location: [facility] OR;  Service: Orthopedics;;   APPLICATION OF WOUND  VAC Left 12/01/2021   Procedure: APPLICATION OF WOUND VAC;  Surgeon: Cammy Copa, MD;  Location: [facility] OR;  Service: Orthopedics;  Laterality: Left;   BACK SURGERY     CESAREAN SECTION     times 2   CHOLECYSTECTOMY     COLONOSCOPY  2020   ENDOMETRIAL ABLATION     I & D EXTREMITY Left 12/06/2021   Procedure: LEFT SHOULDER DEBRIDEMENT AND WOUND CLOSURE;  Surgeon: [name], MD;  Location: [facility] OR;  Service: Orthopedics;  Laterality: Left;   I & D EXTREMITY Left 12/20/2021   Procedure: LEFT SHOULDER DEBRIDEMENT;  Surgeon: [name], MD;  Location: [facility] OR;  Service: Orthopedics;  Laterality: Left;   I & D EXTREMITY Left 12/22/2021   Procedure: LEFT SHOULDER DEBRIDEMENT;  Surgeon: [name], MD;  Location: [facility] OR;  Service: Orthopedics;  Laterality: Left;   INCISION AND DRAINAGE Left 12/01/2021   Procedure: LEFT SHOULDER IRRIGATION AND EXCISIONAL DEBRIDEMENT;  Surgeon: Cammy Copa, MD;  Location: [facility] OR;  Service: Orthopedics;  Laterality: Left;   INCISION AND DRAINAGE HIP Left 11/10/2015   Procedure: IRRIGATION AND DEBRIDEMENT LEFT HIP INCISION;  Surgeon: Kathryne Hitch, MD;  Location: [facility] OR;  Service: Orthopedics;  Laterality: Left;   JOINT REPLACEMENT  2011   total left knee   KNEE ARTHROPLASTY  04/23/2012   right    KNEE ARTHROSCOPY     LUMBAR LAMINECTOMY/DECOMPRESSION MICRODISCECTOMY N/A 06/23/2019   Procedure: RIGHT LUMBAR FIVE THROUGH SACRAL ONE PARTIAL HEMILAMINECTOMY WITH RIGHT LUMBAR FIVE FORAMINOTOMY;  Surgeon: Kerrin Champagne, MD;  Location: [facility] OR;  Service: Orthopedics;  Laterality: N/A;   LUMBAR WOUND DEBRIDEMENT N/A 06/11/2018   Procedure: LUMBAR WOUND DEBRIDEMENT;  Surgeon: Kerrin Champagne, MD;  Location: [facility] OR;  Service: Orthopedics;  Laterality: N/A;   RADIOLOGY WITH ANESTHESIA N/A 09/09/2018   Procedure: LUMBER SPINE WITHOUT CONTRAST;  Surgeon: Radiologist, Medication, MD;  Location: [facility] OR;  Service: Radiology;  Laterality: N/A;   REVERSE SHOULDER  ARTHROPLASTY Left 11/16/2021   Procedure: LEFT REVERSE SHOULDER ARTHROPLASTY;  Surgeon: Cammy Copa, MD;  Location[facility] OR;  Service: Orthopedics;  Laterality: Left;   ROTATOR CUFF REPAIR     left    SHOULDER SURGERY     right to repair ligament tear   TOTAL HIP ARTHROPLASTY Left 09/09/2015   Procedure: LEFT TOTAL HIP ARTHROPLASTY ANTERIOR APPROACH;  Surgeon: Kathryne Hitch, MD;  Location: [facility] ORS;  Service: Orthopedics;  Laterality: Left;   TOTAL KNEE ARTHROPLASTY  04/23/2012   Procedure: TOTAL KNEE ARTHROPLASTY;  Surgeon: [name], MD;  Location: [facility] OR;  Service: Orthopedics;  Laterality: Right;  Right Total Knee Arthroplasty   TUBAL LIGATION  1996   Social History   Occupational History   Not on file  Tobacco Use   Smoking status: Former    Packs/day: 0.50    Years: 4.00    Additional pack years: 0.00    Total pack years: 2.00    Types: Cigarettes    Quit date: 09/18/1987    Years since quitting: 35.4   Smokeless tobacco: Never  Vaping Use   Vaping Use: Never used  Substance and Sexual Activity   Alcohol use: No   Drug use: No   Sexual activity: Yes    Birth control/protection: Surgical

## 2023-03-31 ENCOUNTER — Encounter (HOSPITAL_COMMUNITY): Payer: Self-pay

## 2023-03-31 ENCOUNTER — Emergency Department (HOSPITAL_COMMUNITY): Payer: 59

## 2023-03-31 ENCOUNTER — Observation Stay (HOSPITAL_COMMUNITY)
Admission: EM | Admit: 2023-03-31 | Discharge: 2023-04-03 | Disposition: A | Discharge status: Home / Self Care | Payer: 59 | Attending: Family Medicine | Admitting: Family Medicine

## 2023-03-31 ENCOUNTER — Other Ambulatory Visit: Payer: Self-pay

## 2023-03-31 DIAGNOSIS — J45998 Other asthma: Secondary | ICD-10-CM | POA: Insufficient documentation

## 2023-03-31 DIAGNOSIS — Z87891 Personal history of nicotine dependence: Secondary | ICD-10-CM | POA: Diagnosis not present

## 2023-03-31 DIAGNOSIS — N179 Acute kidney failure, unspecified: Secondary | ICD-10-CM | POA: Diagnosis not present

## 2023-03-31 DIAGNOSIS — F418 Other specified anxiety disorders: Secondary | ICD-10-CM | POA: Diagnosis present

## 2023-03-31 DIAGNOSIS — E876 Hypokalemia: Secondary | ICD-10-CM | POA: Diagnosis not present

## 2023-03-31 DIAGNOSIS — G4733 Obstructive sleep apnea (adult) (pediatric): Secondary | ICD-10-CM

## 2023-03-31 DIAGNOSIS — R296 Repeated falls: Secondary | ICD-10-CM

## 2023-03-31 DIAGNOSIS — I129 Hypertensive chronic kidney disease with stage 1 through stage 4 chronic kidney disease, or unspecified chronic kidney disease: Secondary | ICD-10-CM | POA: Diagnosis not present

## 2023-03-31 DIAGNOSIS — Z79899 Other long term (current) drug therapy: Secondary | ICD-10-CM | POA: Insufficient documentation

## 2023-03-31 DIAGNOSIS — E039 Hypothyroidism, unspecified: Secondary | ICD-10-CM | POA: Insufficient documentation

## 2023-03-31 DIAGNOSIS — Z6841 Body Mass Index (BMI) 40.0 and over, adult: Secondary | ICD-10-CM | POA: Diagnosis not present

## 2023-03-31 DIAGNOSIS — N1831 Chronic kidney disease, stage 3a: Secondary | ICD-10-CM | POA: Diagnosis not present

## 2023-03-31 DIAGNOSIS — N3080 Other cystitis without hematuria: Secondary | ICD-10-CM | POA: Diagnosis present

## 2023-03-31 DIAGNOSIS — G9341 Metabolic encephalopathy: Secondary | ICD-10-CM | POA: Diagnosis present

## 2023-03-31 DIAGNOSIS — I10 Essential (primary) hypertension: Secondary | ICD-10-CM | POA: Diagnosis present

## 2023-03-31 DIAGNOSIS — M109 Gout, unspecified: Secondary | ICD-10-CM | POA: Diagnosis present

## 2023-03-31 DIAGNOSIS — Z96642 Presence of left artificial hip joint: Secondary | ICD-10-CM | POA: Insufficient documentation

## 2023-03-31 DIAGNOSIS — Z96612 Presence of left artificial shoulder joint: Secondary | ICD-10-CM | POA: Diagnosis not present

## 2023-03-31 DIAGNOSIS — I890 Lymphedema, not elsewhere classified: Secondary | ICD-10-CM | POA: Insufficient documentation

## 2023-03-31 DIAGNOSIS — Z91040 Latex allergy status: Secondary | ICD-10-CM | POA: Insufficient documentation

## 2023-03-31 DIAGNOSIS — M4696 Unspecified inflammatory spondylopathy, lumbar region: Secondary | ICD-10-CM | POA: Insufficient documentation

## 2023-03-31 DIAGNOSIS — Z96653 Presence of artificial knee joint, bilateral: Secondary | ICD-10-CM | POA: Insufficient documentation

## 2023-03-31 DIAGNOSIS — J45909 Unspecified asthma, uncomplicated: Secondary | ICD-10-CM | POA: Diagnosis present

## 2023-03-31 DIAGNOSIS — M25562 Pain in left knee: Secondary | ICD-10-CM | POA: Diagnosis present

## 2023-03-31 LAB — COMPREHENSIVE METABOLIC PANEL
ALT: 32 U/L (ref 0–44)
AST: 46 U/L — ABNORMAL HIGH (ref 15–41)
Albumin: 3.8 g/dL (ref 3.5–5.0)
Alkaline Phosphatase: 73 U/L (ref 38–126)
Anion gap: 14 (ref 5–15)
BUN: 28 mg/dL — ABNORMAL HIGH (ref 6–20)
CO2: 24 mmol/L (ref 22–32)
Calcium: 9.3 mg/dL (ref 8.9–10.3)
Chloride: 96 mmol/L — ABNORMAL LOW (ref 98–111)
Creatinine, Ser: 2.03 mg/dL — ABNORMAL HIGH (ref 0.44–1.00)
GFR, Estimated: 29 mL/min — ABNORMAL LOW (ref >60)
Glucose, Bld: 116 mg/dL — ABNORMAL HIGH (ref 70–99)
Potassium: 2.9 mmol/L — ABNORMAL LOW (ref 3.5–5.1)
Sodium: 134 mmol/L — ABNORMAL LOW (ref 135–145)
Total Bilirubin: 1 mg/dL (ref 0.3–1.2)
Total Protein: 7.4 g/dL (ref 6.5–8.1)

## 2023-03-31 LAB — CBC WITH DIFFERENTIAL/PLATELET
Abs Immature Granulocytes: 0.03 10*3/uL (ref 0.00–0.07)
Basophils Absolute: 0 10*3/uL (ref 0.0–0.1)
Basophils Relative: 0 %
Eosinophils Absolute: 0.3 10*3/uL (ref 0.0–0.5)
Eosinophils Relative: 5 %
HCT: 33.5 % — ABNORMAL LOW (ref 36.0–46.0)
Hemoglobin: 10.5 g/dL — ABNORMAL LOW (ref 12.0–15.0)
Immature Granulocytes: 1 %
Lymphocytes Relative: 31 %
Lymphs Abs: 1.9 10*3/uL (ref 0.7–4.0)
MCH: 28.3 pg (ref 26.0–34.0)
MCHC: 31.3 g/dL (ref 30.0–36.0)
MCV: 90.3 fL (ref 80.0–100.0)
Monocytes Absolute: 0.4 10*3/uL (ref 0.1–1.0)
Monocytes Relative: 7 %
Neutro Abs: 3.5 10*3/uL (ref 1.7–7.7)
Neutrophils Relative %: 56 %
Platelets: 226 10*3/uL (ref 150–400)
RBC: 3.71 MIL/uL — ABNORMAL LOW (ref 3.87–5.11)
RDW: 15.2 % (ref 11.5–15.5)
WBC: 6.2 10*3/uL (ref 4.0–10.5)
nRBC: 0 % (ref 0.0–0.2)

## 2023-03-31 LAB — URINALYSIS, ROUTINE W REFLEX MICROSCOPIC
Bilirubin Urine: NEGATIVE
Glucose, UA: NEGATIVE mg/dL
Ketones, ur: NEGATIVE mg/dL
Nitrite: NEGATIVE
Protein, ur: NEGATIVE mg/dL
Specific Gravity, Urine: 1.011 (ref 1.005–1.030)
pH: 5 (ref 5.0–8.0)

## 2023-03-31 LAB — BLOOD GAS, VENOUS
Acid-Base Excess: 6.7 mmol/L — ABNORMAL HIGH (ref 0.0–2.0)
Bicarbonate: 32.5 mmol/L — ABNORMAL HIGH (ref 20.0–28.0)
Drawn by: 62498
O2 Saturation: 72.7 %
Patient temperature: 36.4
pCO2, Ven: 48 mmHg (ref 44–60)
pH, Ven: 7.44 — ABNORMAL HIGH (ref 7.25–7.43)
pO2, Ven: 41 mmHg (ref 32–45)

## 2023-03-31 LAB — AMMONIA: Ammonia: 12 umol/L (ref 9–35)

## 2023-03-31 LAB — MAGNESIUM: Magnesium: 1.8 mg/dL (ref 1.7–2.4)

## 2023-03-31 MED ORDER — SODIUM CHLORIDE 0.9 % IV SOLN: INTRAVENOUS | Status: AC

## 2023-03-31 MED ORDER — ACETAMINOPHEN 325 MG PO TABS: 650.0000 mg | ORAL_TABLET | Freq: Four times a day (QID) | ORAL | Status: DC | PRN

## 2023-03-31 MED ORDER — IPRATROPIUM-ALBUTEROL 0.5-2.5 (3) MG/3ML IN SOLN: 3.0000 mL | RESPIRATORY_TRACT | Status: DC | PRN

## 2023-03-31 MED ORDER — POTASSIUM CHLORIDE 10 MEQ/100ML IV SOLN
10.0000 meq | INTRAVENOUS | Status: AC
  Administered 2023-03-31 (×3): 10 meq via INTRAVENOUS
  Filled 2023-03-31 (×3): qty 100

## 2023-03-31 MED ORDER — HEPARIN SODIUM (PORCINE) 5000 UNIT/ML IJ SOLN
5000.0000 [IU] | Freq: Three times a day (TID) | INTRAMUSCULAR | Status: DC
  Administered 2023-03-31 – 2023-04-03 (×8): 5000 [IU] via SUBCUTANEOUS
  Filled 2023-03-31 (×7): qty 1

## 2023-03-31 MED ORDER — POTASSIUM CHLORIDE CRYS ER 20 MEQ PO TBCR
40.0000 meq | EXTENDED_RELEASE_TABLET | Freq: Once | ORAL | Status: AC
  Administered 2023-03-31: 40 meq via ORAL
  Filled 2023-03-31: qty 2

## 2023-03-31 MED ORDER — POTASSIUM CHLORIDE CRYS ER 20 MEQ PO TBCR: 40.0000 meq | EXTENDED_RELEASE_TABLET | Freq: Once | ORAL | Status: DC

## 2023-03-31 MED ORDER — ACETAMINOPHEN 650 MG RE SUPP: 650.0000 mg | Freq: Four times a day (QID) | RECTAL | Status: DC | PRN

## 2023-03-31 MED ORDER — ONDANSETRON HCL 4 MG/2ML IJ SOLN: 4.0000 mg | Freq: Three times a day (TID) | INTRAMUSCULAR | Status: DC | PRN

## 2023-03-31 MED ORDER — ONDANSETRON HCL 4 MG PO TABS: 4.0000 mg | ORAL_TABLET | Freq: Three times a day (TID) | ORAL | Status: DC | PRN

## 2023-03-31 MED ORDER — ALLOPURINOL 100 MG PO TABS
100.0000 mg | ORAL_TABLET | Freq: Two times a day (BID) | ORAL | Status: DC
  Administered 2023-03-31 – 2023-04-03 (×6): 100 mg via ORAL
  Filled 2023-03-31 (×6): qty 1

## 2023-03-31 MED ORDER — LEVOTHYROXINE SODIUM 75 MCG PO TABS
150.0000 ug | ORAL_TABLET | Freq: Every day | ORAL | Status: DC
  Administered 2023-04-01 – 2023-04-03 (×3): 150 ug via ORAL
  Filled 2023-03-31 (×3): qty 2

## 2023-03-31 MED ORDER — SODIUM CHLORIDE 0.9 % IV BOLUS
1000.0000 mL | Freq: Once | INTRAVENOUS | Status: AC
  Administered 2023-03-31: 1000 mL via INTRAVENOUS

## 2023-03-31 MED ORDER — PANTOPRAZOLE SODIUM 40 MG PO TBEC
40.0000 mg | DELAYED_RELEASE_TABLET | Freq: Two times a day (BID) | ORAL | Status: DC
  Administered 2023-03-31 – 2023-04-03 (×6): 40 mg via ORAL
  Filled 2023-03-31 (×6): qty 1

## 2023-03-31 MED ORDER — POLYETHYLENE GLYCOL 3350 17 G PO PACK: 17.0000 g | PACK | Freq: Every day | ORAL | Status: DC | PRN

## 2023-03-31 MED ORDER — SODIUM CHLORIDE 0.9 % IV SOLN
2.0000 g | INTRAVENOUS | Status: DC
  Administered 2023-03-31 – 2023-04-01 (×2): 2 g via INTRAVENOUS
  Filled 2023-03-31 (×3): qty 20

## 2023-03-31 NOTE — ED Triage Notes (Signed)
Pt BIB [facility] C/O multiple falls. C/O L foot and L knee pain. Unable to bear weight since fall.

## 2023-03-31 NOTE — Assessment & Plan Note (Addendum)
Cr-2.03, on CKD stage IIIa.  Baseline creatinine 1.3-1.5.  No GI losses.  But she is on Lasix 20 mg twice daily and HCTZ. -Hold HCTZ and Lasix -1 L bolus given, Continue N/s 100cc/hr x 20hrs - Unable to obtain peripheral IV access, central line right femoral placed in ED by ED provider.

## 2023-03-31 NOTE — ED Provider Notes (Signed)
[place] EMERGENCY DEPARTMENT AT [facility] Provider Note   CSN: [id number] Arrival date & time: 03/31/23  1413     History  Chief Complaint  Patient presents with   Marilyn [name] is a 52 y.o. female.   Fall   52 year old female presents emergency department with complaints of fall.  Patient reports fall today when she was at her bedside and leaned over to pick up an object causing her to fall on her left knee.  Patient reports 3-4 falls over the past week or so.  Prior falls included tripping over a ledge on the cement and other of similar mechanical type nature.  Patient denies trauma to head, loss of consciousness, blood thinner use.  States that her legs have been more swollen over the past 3 to 4 weeks making it more difficult to walk and easier to trip on objects.  Reports history of lymphedema.  From past couple of falls, complaining of bilateral hip pain, bilateral knee pain, left foot/ankle pain.  Denies any chest pain, shortness of breath, abdominal pain, nausea, vomiting.  Father at bedside states that he has noticed of the patient being more "tired" over the past couple of days and falling asleep more than usual.  Past medical history significant for obesity, lumbar spinal stenosis, hypertension, hypokalemia, hypothyroidism, asthma, chronic right shoulder pain, right hip pain, fusion of spine of lumbar region, syncope, C. 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 Authorizing Provider  albuterol (VENTOLIN HFA) 108 (90 Base) MCG/ACT inhaler Inhale 1-2 puffs into the lungs every 6 (six) hours as needed for wheezing or shortness of breath. 12/09/19  Yes Henderly, Britni A, PA-C  allopurinol (ZYLOPRIM) 100 MG tablet Take 1 tablet (100 mg total) by mouth 2 (two) times daily. 02/13/22  03/31/23 Yes Kerrin Champagne, MD  ascorbic Acid (VITAMIN C) 500 MG CPCR Take 500 mg by mouth daily. Gummy   Yes [provider]  BELSOMRA 15 MG TABS Take 1 tablet by mouth at bedtime as needed.   Yes [provider]  budesonide (PULMICORT) 0.5 MG/2ML nebulizer solution Take by nebulization. 01/15/23  Yes [provider]  buPROPion (WELLBUTRIN XL) 150 MG 24 hr tablet Take 150 mg by mouth 2 (two) times daily. Take with 300 mg to =450 mg 06/21/22 07/16/23 Yes [provider]  buPROPion (WELLBUTRIN XL) 300 MG 24 hr tablet Take 300 mg by mouth in the morning and at bedtime. Take with 150 mg to = 450 mg twice daily 06/29/20  Yes [provider]  cyclobenzaprine (FLEXERIL) 10 MG tablet Take 1 tablet by mouth 3 (three) times daily as needed. 01/28/23  Yes [provider]  DULoxetine (CYMBALTA) 60 MG capsule Take 60 mg by mouth 2 (two) times daily. 05/02/21  Yes [provider]  Ferrous Gluconate 324 (37.5 Fe) MG TABS Take 324 mg by mouth daily. 02/14/20  Yes [provider]  furosemide (LASIX) 20 MG tablet Take 20 mg by mouth daily.   Yes [provider]  hydrochlorothiazide (HYDRODIURIL) 25 MG tablet Take 25 mg by mouth daily. 09/15/21  Yes [provider]  HYDROcodone-acetaminophen (NORCO/VICODIN) 5-325 MG tablet Take 1 tablet by mouth every 8 (eight) hours as needed. 01/20/23  Yes [provider]  ipratropium-albuterol (DUONEB) 0.5-2.5 (3) MG/3ML SOLN Inhale into the lungs.  02/15/23  Yes [provider]  levothyroxine (SYNTHROID) 150 MCG tablet Take 150 mcg by mouth daily before breakfast. 05/18/19  Yes [provider]  naloxone (NARCAN) nasal spray 4 mg/0.1 mL Place into the nose. 08/21/22  Yes [provider]  nystatin (MYCOSTATIN/NYSTOP) powder Apply 1 Application topically 3 (three) times daily. 11/06/22  Yes [provider]  omeprazole (PRILOSEC) 40 MG capsule Take 40 mg by mouth  2 (two) times daily.   Yes [provider]  oxybutynin (DITROPAN XL) 15 MG 24 hr tablet Take 15 mg by mouth daily.  05/31/17  Yes [provider]  pregabalin (LYRICA) 75 MG capsule Take 1 capsule (75 mg total) by mouth 2 (two) times daily. 08/07/22  Yes London Sheer, MD  vitamin B-12 (CYANOCOBALAMIN) 250 MCG tablet Take 250 mcg by mouth daily.   Yes [provider]  cycloSPORINE (RESTASIS) 0.05 % ophthalmic emulsion Place 1 drop into both eyes 2 (two) times daily as needed (dry eyes).    [provider]      Allergies    Lisinopril, Bee venom, Bupropion, Latex, Propoxyphene, Methadone, Codeine, Meloxicam, Morphine and codeine, Tegaderm ag mesh [silver], and Tomato    Review of Systems   Review of Systems  All other systems reviewed and are negative.   Physical Exam Updated Vital Signs BP 112/71 (BP Location: Left Arm)   Pulse 60   Temp 98 F (36.7 C) (Oral)   Resp 16   Ht 5\' 1"  (1.549 m)   Wt (!) 181.1 kg   LMP  (LMP Unknown) Comment: tubal ligation  SpO2 97%   BMI 75.44 kg/m  Physical Exam Vitals and nursing note reviewed.  Constitutional:      General: She is not in acute distress.    Appearance: She is well-developed. She is obese.     Comments: Patient often dozes off during exam and while being asked questions but able to respond appropriately.  HENT:     Head: Normocephalic and atraumatic.  Eyes:     Conjunctiva/sclera: Conjunctivae normal.  Cardiovascular:     Rate and Rhythm: Normal rate and regular rhythm.     Heart sounds: No murmur heard. Pulmonary:     Effort: Pulmonary effort is normal. No respiratory distress.     Breath sounds: Normal breath sounds. No wheezing or rales.     Comments: Patient with right-sided lateral rib tenderness to palpation.  Some tenderness palpation of right lateral abdomen. Abdominal:     Palpations: Abdomen is soft.     Tenderness: There is no right CVA tenderness, left CVA tenderness or  guarding.  Musculoskeletal:        General: Swelling present.     Cervical back: Neck supple.     Comments: Patient with significant lower extremity swelling.  Tender to palpation of bilateral hips, bilateral knees diffusely as well as left foot.  No midline tenderness of cervical, thoracic, lumbar spine with no obvious step-off or deformity noted.  No tenderness to palpation of upper extremities.  Radial and pedal pulses 2+ bilaterally.  Patient able to move all 4 extremities.  Skin:    General: Skin is warm and dry.     Capillary Refill: Capillary refill takes less than 2 seconds.  Neurological:     Mental Status: She is alert.     Comments: Alert and oriented to self, place, time and event.   Speech is fluent, clear without dysarthria or dysphasia.   Patient moves all 4  extremities without difficulty.  Sensation intact in upper/lower extremities   Gait unable to be assessed CN I not tested  CN II not tested CN III, IV, VI PERRLA and EOMs intact bilaterally  CN V Intact sensation to sharp and light touch to the face  CN VII facial movements symmetric  CN VIII not tested  CN IX, X no uvula deviation, symmetric rise of soft palate  CN XI 5/5 SCM and trapezius strength bilaterally  CN XII Midline tongue protrusion, symmetric L/R movements        ED Results / Procedures / Treatments   Labs (all labs ordered are listed, but only abnormal results are displayed) Labs Reviewed  COMPREHENSIVE METABOLIC PANEL - Abnormal; Notable for the following components:      Result Value   Sodium 134 (*)    Potassium 2.9 (*)    Chloride 96 (*)    Glucose, Bld 116 (*)    BUN 28 (*)    Creatinine, Ser 2.03 (*)    AST 46 (*)    GFR, Estimated 29 (*)    All other components within normal limits  CBC WITH DIFFERENTIAL/PLATELET - Abnormal; Notable for the following components:   RBC 3.71 (*)    Hemoglobin 10.5 (*)    HCT 33.5 (*)    All other components within normal limits  URINALYSIS,  ROUTINE W REFLEX MICROSCOPIC - Abnormal; Notable for the following components:   APPearance HAZY (*)    Hgb urine dipstick MODERATE (*)    Leukocytes,Ua LARGE (*)    Bacteria, UA FEW (*)    All other components within normal limits  BLOOD GAS, VENOUS - Abnormal; Notable for the following components:   pH, Ven 7.44 (*)    Bicarbonate 32.5 (*)    Acid-Base Excess 6.7 (*)    All other components within normal limits  URINE CULTURE  MAGNESIUM  AMMONIA  HIV ANTIBODY (ROUTINE TESTING W REFLEX)  BASIC METABOLIC PANEL  CBC    EKG None  Radiology DG Foot Complete Left  Result Date: 03/31/2023 CLINICAL DATA:  Trauma, fall EXAM: LEFT FOOT - COMPLETE 3+ VIEW COMPARISON:  None Available. FINDINGS: No recent fracture or dislocation is seen. Bony spurs are seen in the first metatarsophalangeal joint and interphalangeal joint of big toe. IMPRESSION: No recent fracture or dislocation is seen left foot. Electronically Signed   By: Ernie Avena M.D.   On: 03/31/2023 16:55   DG Knee Complete 4 Views Right  Result Date: 03/31/2023 CLINICAL DATA:  Trauma, fall, pain EXAM: RIGHT KNEE - COMPLETE 4+ VIEW COMPARISON:  None FINDINGS: Previous right knee arthroplasty. No recent fracture or dislocation is seen. Lateral view is technically less than optimal. Smoothly marginated calcifications adjacent to the distal femur may be residual from previous trauma and previous surgery. IMPRESSION: Previous right knee arthroplasty. No recent fracture or dislocation is seen. Electronically Signed   By: Ernie Avena M.D.   On: 03/31/2023 16:53   DG Knee Complete 4 Views Left  Result Date: 03/31/2023 CLINICAL DATA:  Trauma, fall EXAM: LEFT KNEE - COMPLETE 4+ VIEW COMPARISON:  02/06/2021 FINDINGS: Lateral view is less than optimal. As far as seen, no recent fracture or dislocation is seen. There is previous left knee arthroplasty. Smooth marginated calcification medial to the proximal tibia has not changed.  IMPRESSION: No displaced fracture or dislocation is seen. Previous left knee arthroplasty. Lateral view is less than optimal limiting evaluation. Electronically Signed   By: Harlan Stains.D.  On: 03/31/2023 16:51   CT Cervical Spine Wo Contrast  Result Date: 03/31/2023 CLINICAL DATA:  Trauma, fall EXAM: CT CERVICAL SPINE WITHOUT CONTRAST TECHNIQUE: Multidetector CT imaging of the cervical spine was performed without intravenous contrast. Multiplanar CT image reconstructions were also generated. RADIATION DOSE REDUCTION: This exam was performed according to the departmental dose-optimization program which includes automated exposure control, adjustment of the mA and/or kV according to patient size and/or use of iterative reconstruction technique. COMPARISON:  None Available. FINDINGS: Examination was technically somewhat limited due to patient's body habitus. Alignment: Alignment of posterior margins of vertebral bodies is within normal limits. Skull base and vertebrae: No recent fracture is seen. Soft tissues and spinal canal: There is no central spinal stenosis. Disc levels: There is mild encroachment of neural foramina from C3-C7 levels. Upper chest: Upper lung fields are unremarkable. Other: None. IMPRESSION: No recent fracture is seen cervical spine. Cervical spondylosis with mild encroachment of neural foramina at multiple levels. Electronically Signed   By: Ernie Avena M.D.   On: 03/31/2023 16:50   CT CHEST ABDOMEN PELVIS WO CONTRAST  Result Date: 03/31/2023 CLINICAL DATA:  Multiple falls.  Polytrauma.  Unable to bear weight. EXAM: CT CHEST, ABDOMEN AND PELVIS WITHOUT CONTRAST TECHNIQUE: Multidetector CT imaging of the chest, abdomen and pelvis was performed following the standard protocol without IV contrast. RADIATION DOSE REDUCTION: This exam was performed according to the departmental dose-optimization program which includes automated exposure control, adjustment of the mA and/or  kV according to patient size and/or use of iterative reconstruction technique. COMPARISON:  Multiple exams, including 11/19/2019 and 12/19/2021 FINDINGS: Body habitus reduces diagnostic sensitivity and specificity. CT CHEST FINDINGS Cardiovascular: Atheromatous vascular calcification of the thoracic aorta. Mediastinum/Nodes: Unremarkable Lungs/Pleura: Mild scarring in the left lower lobe. Musculoskeletal: Left total shoulder prosthesis. Dorsal column stimulator with lead tips running from T7 through T11. Mid and lower thoracic spondylosis. CT ABDOMEN PELVIS FINDINGS Hepatobiliary: Diffuse hepatic steatosis.  Cholecystectomy. Pancreas: Unremarkable Spleen: Unremarkable Adrenals/Urinary Tract: The kidneys and adrenal glands appear normal. There is gas in the wall of the urinary bladder compatible with emphysematous cystitis. Trace amount of gas just outside of bladder wall and medial to the pectineus muscle on images 104 through 105 of series 2. Stomach/Bowel: Unremarkable Vascular/Lymphatic: Mild aortoiliac atherosclerotic vascular calcification. Reproductive: Unremarkable Other: No supplemental non-categorized findings. Musculoskeletal: Left total hip prosthesis. Posterolateral rod and pedicle screw fixation at L4-L5-S1, with interbody solid-appearing fusion at L4-5 and L5-S1. Suspected bilateral foraminal impingement at L3-4 and possibly at L2-3 due to spurring. IMPRESSION: 1. Scattered small amounts of gas in the urinary bladder wall compatible with emphysematous cystitis. Miniscule amount of gas tracking just outside the bladder wall in the right lower quadrant medial to the pectineus muscle, significance uncertain but no abscess detected. 2. Diffuse hepatic steatosis. 3. Dorsal column stimulator. 4. Suspected bilateral foraminal impingement at L3-4 and possibly at L2-3 due to spurring. Aortic Atherosclerosis (ICD10-I70.0) Electronically Signed   By: Gaylyn Rong M.D.   On: 03/31/2023 16:48   CT Head Wo  Contrast  Result Date: 03/31/2023 CLINICAL DATA:  Trauma, fall EXAM: CT HEAD WITHOUT CONTRAST TECHNIQUE: Contiguous axial images were obtained from the base of the skull through the vertex without intravenous contrast. RADIATION DOSE REDUCTION: This exam was performed according to the departmental dose-optimization program which includes automated exposure control, adjustment of the mA and/or kV according to patient size and/or use of iterative reconstruction technique. COMPARISON:  08/30/2010 FINDINGS: Brain: No acute findings are seen within the cranium.  There are no signs of bleeding within the cranium. Ventricles are not dilated. Vascular: Unremarkable. Skull: No fracture is seen in cul de sac. Hyperostosis frontalis interna is seen. Sinuses/Orbits: Unremarkable. Other: None. IMPRESSION: No acute intracranial findings are seen in noncontrast CT brain. Electronically Signed   By: Ernie Avena M.D.   On: 03/31/2023 16:39    Procedures Procedures    Medications Ordered in ED Medications  cefTRIAXone (ROCEPHIN) 2 g in sodium chloride 0.9 % 100 mL IVPB (2 g Intravenous New Bag/Given 03/31/23 2325)  allopurinol (ZYLOPRIM) tablet 100 mg (100 mg Oral Given 03/31/23 2309)  ipratropium-albuterol (DUONEB) 0.5-2.5 (3) MG/3ML nebulizer solution 3 mL (has no administration in time range)  levothyroxine (SYNTHROID) tablet 150 mcg (has no administration in time range)  pantoprazole (PROTONIX) EC tablet 40 mg (40 mg Oral Given 03/31/23 2309)  heparin injection 5,000 Units (5,000 Units Subcutaneous Given 03/31/23 2309)  0.9 %  sodium chloride infusion ( Intravenous New Bag/Given 03/31/23 2321)  acetaminophen (TYLENOL) tablet 650 mg (has no administration in time range)    Or  acetaminophen (TYLENOL) suppository 650 mg (has no administration in time range)  ondansetron (ZOFRAN) tablet 4 mg (has no administration in time range)    Or  ondansetron (ZOFRAN) injection 4 mg (has no administration in time range)   polyethylene glycol (MIRALAX / GLYCOLAX) packet 17 g (has no administration in time range)  sodium chloride 0.9 % bolus 1,000 mL (0 mLs Intravenous Stopped 03/31/23 2020)  potassium chloride 10 mEq in 100 mL IVPB (10 mEq Intravenous New Bag/Given 03/31/23 2127)  potassium chloride SA (KLOR-CON M) CR tablet 40 mEq (40 mEq Oral Given 03/31/23 2130)  potassium chloride SA (KLOR-CON M) CR tablet 40 mEq (40 mEq Oral Given 03/31/23 2307)    ED Course/ Medical Decision Making/ A&P Clinical Course as of 04/01/23 0025  Sun Mar 31, 2023  1801 Nurses have been unable to establish IV.  Patient needs an IV for fluids for her AKI along with repletion of her potassium.  I have talked to her about a femoral line and she is agreeable to this. [MB]  2022 Consulted Dr. Marlou Porch of urology who evaluated patient CT imaging of the abdomen with concern for emphysematous bladder with possible perforation of which she seemed to not be too concerned given the patient does not have overlying tenderness in said area. [CR]  2046 Consulted hospitalist Dr. Mariea Clonts who agreed with admission and assume further treatment/care. [CR]    Clinical Course User Index [CR] Peter Garter, PA [MB] Terrilee Files, MD                             Medical Decision Making Amount and/or Complexity of Data Reviewed Labs: ordered. Radiology: ordered.  Risk Prescription drug management. Decision regarding hospitalization.   This patient presents to the ED for concern of fall, this involves an extensive number of treatment options, and is a complaint that carries with it a high risk of complications and morbidity.  The differential diagnosis includes fracture, strain/pain, dislocation, ligamentous/tendinous injury, neurovascular compromise, CVA, sepsis, seizure, metabolic encephalopathy,   Co morbidities that complicate the patient evaluation  See HPI   Additional history obtained:  Additional history obtained from  EMR External records from outside source obtained and reviewed including hospital records   Lab Tests:  I Ordered, and personally interpreted labs.  The pertinent results include: No leukocytosis.  Evidence of anemia with a  hemoglobin of 10.5 of which is near patient's baseline.  Placed within range.  Multiple electrolyte abnormalities including hyponatremia, hypokalemia, hypochloremia of 134, 2.9, 96 respectively.  Patient with AKI with BUN of 28, creatinine of 2.03 and GFR of 29.  Mild nonspecific elevation of AST of 46.  UA contaminated sample with 11-20 squamous epithelial cells but with few bacteria, 11-20 WBCs and large leukocytes.   Imaging Studies ordered:  I ordered imaging studies including CT head/cervical spine, CT chest abdomen pelvis, right and left knee x-ray, left foot x-ray I independently visualized and interpreted imaging which showed  CT head/cervical spine: No acute intracranial abnormality.  No acute fracture or traumatic subluxation of cervical spine. CT chest abdomen pelvis: Small amount of gas and urinary bladder wall.  Diffuse hepatic steatosis.  Dorsal column stimulator.  Suspected bilateral foraminal impingement of L3-4 and possibly at L2-3.  Aortic atherosclerosis. Right knee x-ray: No acute abnormalities Left knee x-ray: No acute abnormalities Left foot x-ray: No acute abnormalities I agree with the radiologist interpretation  Cardiac Monitoring: / EKG:  The patient was maintained on a cardiac monitor.  I personally viewed and interpreted the cardiac monitored which showed an underlying rhythm of: Sinus rhythm   Consultations Obtained:  See ED course  Problem List / ED Course / Critical interventions / Medication management  AKI, hypokalemia, recurrent falls I ordered medication including potassium chloride, normal saline, Rocephin   Reevaluation of the patient after these medicines showed that the patient improved I have reviewed the patients home  medicines and have made adjustments as needed   Social Determinants of Health:  Former cigarette use.  Denies illicit drug use.   Test / Admission - Considered:  AKI, hypokalemia, recurrent falls Vitals signs within normal range and stable throughout visit. Laboratory/imaging studies significant for: See above 52 year old female presents emergency department after a fall.  Patient reports multiple falls over the past week with all being described as mechanical type in nature including the 1 prompting visit to the emergency department today.  From a traumatic perspective, workup today overall reassuring without evidence of acute traumatic injury from fall.  Questionable perforated bladder on CT imaging but when imaging was reviewed by Dr. Marlou Porch of urology, recommendation was for observation given patient without significant suprapubic tenderness on exam.  Initially, patient rather somnolent falling asleep in 30 seconds or so intervals during HPI and during physical exam and described to be different from baseline by family members at bedside.  Regarding patient's slightly altered mentation, patient with evidence of multiple electrolyte abnormalities as well as pretty significant AKI.  After intravenous fluids, patient was awake and alert and much more communicative; I suspect that patient's symptoms were more metabolic in nature as cause of her mild encephalopathy.  Regarding patient's multiple falls, seem to be most likely secondary to increased lower extremity edema causing her to trip and fall.  Given findings of AKI, hypokalemia and patient with recurrent falls, admission deemed most appropriate.  Consulted hospitalist who agreed with admission.  Treatment plan discussed in length with patient and she acknowledged understanding was agreeable to said plan.  Patient stable upon admission.        Final Clinical Impression(s) / ED Diagnoses Final diagnoses:  AKI (acute kidney injury) (HCC)   Recurrent falls  Hypokalemia    Rx / DC Orders ED Discharge Orders     None         Peter Garter, [place] 04/01/23 0026    Bethann Berkshire,  MD 04/01/23 1558

## 2023-03-31 NOTE — ED Notes (Signed)
Dr. Charm Barges at bedside discussing central line placement with patient.

## 2023-03-31 NOTE — Assessment & Plan Note (Addendum)
UA with large leukocytes, few bacteria.  WBC 6.2.  Afebrile.  Rules out for sepsis.  Reports right flank pain.  CT chest abdomen and pelvis without contrast-scattered small amounts of gas near urinary bladder wall compatible with emphysematous cystitis.  Also minuscule amount of gas tracking just outside the bladder wall.  Uncertain significance.  No abscess. -IV ceftriaxone 2 g daily -No recent urine cultures -Add on urine cultures - EDP to Dr. Marlou Porch, no need for urgent procedure -Urology consultation in a.m.

## 2023-03-31 NOTE — ED Notes (Signed)
Pt gone to Xray 

## 2023-03-31 NOTE — Assessment & Plan Note (Signed)
Resume allopurinol 

## 2023-03-31 NOTE — Assessment & Plan Note (Signed)
Stable. -Hold HCTZ and Lasix for now with AKI and to allow for hydration

## 2023-03-31 NOTE — Assessment & Plan Note (Addendum)
Patient quite frequently falling asleep several times during examination.  Arouses to touch, wakes up, alert and answers questions appropriately.  Head CT negative for acute abnormality.  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 Recent frequent falls likely related to above, drowsiness and morbid obesity.  ABG shows pH of 7.4, pCO2 of 48. -CPAP nightly, antibiotics, hydrate -Hold psychoactive medications bupropion, Cymbalta, belsomra, flexeril, Hydrocodone, lyrica -PT eval, may need assistive devices for ambulation

## 2023-03-31 NOTE — Assessment & Plan Note (Signed)
K- 2.9. MAg- 1.8. - Replete

## 2023-03-31 NOTE — ED Notes (Signed)
Pt is a hard stick. Several people have attempted peripheral line.

## 2023-03-31 NOTE — Assessment & Plan Note (Addendum)
Morbid obesity with OSA and possibly OHS with daytime hypoxemia.  BMI of 69.9.  Reports increasing lower extremity swelling, no appreciable pitting on exam.   -CPAP nightly -Hold psychoactive medications

## 2023-03-31 NOTE — Assessment & Plan Note (Signed)
Stable.  Resume home regimen 

## 2023-03-31 NOTE — Assessment & Plan Note (Signed)
Hold Cymbalta bupropion with drowsiness

## 2023-03-31 NOTE — ED Notes (Signed)
Central line kit at bedside for Dr. Charm Barges.

## 2023-03-31 NOTE — ED Provider Notes (Signed)
.  Central Line  Date/Time: 03/31/2023 6:44 PM  Performed by: Terrilee Files, MD Authorized by: Terrilee Files, MD   Consent:    Consent obtained:  Written   Consent given by:  Patient   Risks, benefits, and alternatives were discussed: yes     Risks discussed:  Arterial puncture, bleeding, infection, incorrect placement and nerve damage   Alternatives discussed:  Delayed treatment, no treatment and alternative treatment Universal protocol:    Procedure explained and questions answered to patient or proxy's satisfaction: yes     Patient identity confirmed:  Verbally with patient Pre-procedure details:    Indication(s): central venous access and insufficient peripheral access     Hand hygiene: Hand hygiene performed prior to insertion     Sterile barrier technique: All elements of maximal sterile technique followed     Skin preparation:  Chlorhexidine   Skin preparation agent: Skin preparation agent completely dried prior to procedure   Sedation:    Sedation type:  None Anesthesia:    Anesthesia method:  Local infiltration Procedure details:    Location:  R femoral   Patient position:  Supine   Procedural supplies:  Triple lumen   Ultrasound guidance: yes     Sterile ultrasound techniques: Sterile gel and sterile probe covers were used     Number of attempts:  2   Successful placement: yes   Post-procedure details:    Post-procedure:  Dressing applied and line sutured   Assessment:  Blood return through all ports and free fluid flow   Procedure completion:  Tolerated well, no immediate complications     Terrilee Files, MD 04/01/23 [phone]

## 2023-03-31 NOTE — H&P (Signed)
History and Physical    Marilyn [name]:[id number] DOB: 01-31-1971 DOA: 03/31/2023  PCP: Rebecka Apley, NP   Patient coming from: Home  I have personally briefly reviewed patient's old medical records in [facility]  Chief Complaint: Falls  HPI: Marilyn [name] is a 52 y.o. female with medical history significant for morbid obesity, OSA on CPAP, hypertension, asthma. Patient presented to the ED with complaints of falls over the past 3 to 4 weeks.  She reports a total of 4 falls.  She denies dizziness or passing out.  She did not hit her head.  All the falls are related to mechanical issue, tripping, and the balance when bending over and subsequently falling.  She reports good oral intake, no vomiting no loose stools.  She has been compliant with Lasix 20 mg twice daily and HCTZ.  She reports swelling to her bilateral lower extremities over the past 3 to 4 weeks, making it difficult for her to walk. She denies pain with urination, denied frequency of abdominal pain.  But complains of right flank pain. No difficulty breathing.  No chest pain.  No fevers no chills. She reports increased sleepiness over the past few days.  ED Course: Temperature 97.5.  Heart rate 61-80.  Respirate 16 -20.  Blood pressure systolic 1 10-1 29.  O2 sats 91 to 100% on room air.  Cr elevated at 2.03.  Potassium 2.9.  UA with large leukocytes few bacteria.  VBG shows pH of 7.4, pCO2 of 48. Head cervical CT negative for acute abnormality CT chest abdomen and pelvis without contrast-scattered small amounts of gas near urinary bladder wall compatible with emphysematous cystitis.  Also minuscule amount of gas tracking just outside the bladder wall.  Uncertain significance.  No abscess. Unable to obtain peripheral IV access in ED, central line placed- Right femoral. 1 L bolus given. EDP to Dr. Marlou Porch urology, no urgent need for intervention.   Review of Systems: As per HPI all other systems reviewed  and negative.  Past Medical History:  Diagnosis Date   Anemia    taking iron now. pt states having no current issues 09/02/2015   Anginal pain (HCC)    pt states experiences chest wall pain pt states related to her asthma    Anxiety    with MRI's   Arthritis    Everywhere   Asthma    Depression    Dizziness    GERD (gastroesophageal reflux disease)    Headache(784.0)    HX OF MIGRAINES   History of bronchitis    Hypertension    Hypothyroidism    takes levothyroxen   OSA on CPAP    wears cpap   Shortness of breath    with exertion   Wears glasses     Past Surgical History:  Procedure Laterality Date   APPLICATION OF WOUND VAC  11/16/2021   Procedure: APPLICATION OF WOUND VAC;  Surgeon: Cammy Copa, MD;  Location: [facility] OR;  Service: Orthopedics;;   APPLICATION OF WOUND VAC Left 12/01/2021   Procedure: APPLICATION OF WOUND VAC;  Surgeon: Cammy Copa, MD;  Location: [facility] OR;  Service: Orthopedics;  Laterality: Left;   BACK SURGERY     CESAREAN SECTION     times 2   CHOLECYSTECTOMY     COLONOSCOPY  2020   ENDOMETRIAL ABLATION     I & D EXTREMITY Left 12/06/2021   Procedure: LEFT SHOULDER DEBRIDEMENT AND WOUND CLOSURE;  Surgeon: [name],  MD;  Location: [facility] OR;  Service: Orthopedics;  Laterality: Left;   I & D EXTREMITY Left 12/20/2021   Procedure: LEFT SHOULDER DEBRIDEMENT;  Surgeon: [name], MD;  Location: [facility] OR;  Service: Orthopedics;  Laterality: Left;   I & D EXTREMITY Left 12/22/2021   Procedure: LEFT SHOULDER DEBRIDEMENT;  Surgeon: [name], MD;  Location: [facility] OR;  Service: Orthopedics;  Laterality: Left;   INCISION AND DRAINAGE Left 12/01/2021   Procedure: LEFT SHOULDER IRRIGATION AND EXCISIONAL DEBRIDEMENT;  Surgeon: Cammy Copa, MD;  Location: [facility] OR;  Service: Orthopedics;  Laterality: Left;   INCISION AND DRAINAGE HIP Left 11/10/2015   Procedure: IRRIGATION AND DEBRIDEMENT LEFT HIP INCISION;  Surgeon: Kathryne Hitch, MD;   Location: [facility] OR;  Service: Orthopedics;  Laterality: Left;   JOINT REPLACEMENT  2011   total left knee   KNEE ARTHROPLASTY  04/23/2012   right    KNEE ARTHROSCOPY     LUMBAR LAMINECTOMY/DECOMPRESSION MICRODISCECTOMY N/A 06/23/2019   Procedure: RIGHT LUMBAR FIVE THROUGH SACRAL ONE PARTIAL HEMILAMINECTOMY WITH RIGHT LUMBAR FIVE FORAMINOTOMY;  Surgeon: Kerrin Champagne, MD;  Location: [facility] OR;  Service: Orthopedics;  Laterality: N/A;   LUMBAR WOUND DEBRIDEMENT N/A 06/11/2018   Procedure: LUMBAR WOUND DEBRIDEMENT;  Surgeon: Kerrin Champagne, MD;  Location: [facility] OR;  Service: Orthopedics;  Laterality: N/A;   RADIOLOGY WITH ANESTHESIA N/A 09/09/2018   Procedure: LUMBER SPINE WITHOUT CONTRAST;  Surgeon: Radiologist, Medication, MD;  Location: [facility] OR;  Service: Radiology;  Laterality: N/A;   REVERSE SHOULDER ARTHROPLASTY Left 11/16/2021   Procedure: LEFT REVERSE SHOULDER ARTHROPLASTY;  Surgeon: Cammy Copa, MD;  Location: [facility] OR;  Service: Orthopedics;  Laterality: Left;   ROTATOR CUFF REPAIR     left    SHOULDER SURGERY     right to repair ligament tear   TOTAL HIP ARTHROPLASTY Left 09/09/2015   Procedure: LEFT TOTAL HIP ARTHROPLASTY ANTERIOR APPROACH;  Surgeon: Kathryne Hitch, MD;  Location: [facility] ORS;  Service: Orthopedics;  Laterality: Left;   TOTAL KNEE ARTHROPLASTY  04/23/2012   Procedure: TOTAL KNEE ARTHROPLASTY;  Surgeon: [name], MD;  Location: [facility] OR;  Service: Orthopedics;  Laterality: Right;  Right Total Knee Arthroplasty   TUBAL LIGATION  1996     reports that she quit smoking about 35 years ago. Her smoking use included cigarettes. She started smoking about 39 years ago. She has a 2 pack-year smoking history. She has never used smokeless tobacco. She reports that she does not drink alcohol and does not use drugs.  Allergies  Allergen Reactions   Lisinopril Anaphylaxis    Angioedema   Bee Venom Swelling and Other (See Comments)    Swelling at the site   Bupropion Swelling    Latex Itching and Rash   Propoxyphene Hives   Methadone Other (See Comments)    unknown   Codeine Nausea Only   Meloxicam Other (See Comments)    Insomnia, constipation   Morphine And Codeine Rash   Tegaderm Ag Mesh [Silver] Hives, Itching and Rash   Tomato Rash    Family History  Problem Relation Age of Onset   Cancer Mother        colon   Epilepsy Mother    Cancer Father        prostate   Diabetes Father    Hypertension Father    Hypertension Maternal Aunt    Diabetes Maternal Aunt    Hypertension Paternal Aunt     Prior  to Admission medications   Medication Sig Start Date End Date Taking? Authorizing Provider  albuterol (VENTOLIN HFA) 108 (90 Base) MCG/ACT inhaler Inhale 1-2 puffs into the lungs every 6 (six) hours as needed for wheezing or shortness of breath. 12/09/19  Yes Henderly, Britni A, PA-C  allopurinol (ZYLOPRIM) 100 MG tablet Take 1 tablet (100 mg total) by mouth 2 (two) times daily. 02/13/22 03/31/23 Yes Kerrin Champagne, MD  ascorbic Acid (VITAMIN C) 500 MG CPCR Take 500 mg by mouth daily. Gummy   Yes [provider]  BELSOMRA 15 MG TABS Take 1 tablet by mouth at bedtime as needed.   Yes [provider]  budesonide (PULMICORT) 0.5 MG/2ML nebulizer solution Take by nebulization. 01/15/23  Yes [provider]  buPROPion (WELLBUTRIN XL) 150 MG 24 hr tablet Take 150 mg by mouth 2 (two) times daily. Take with 300 mg to =450 mg 06/21/22 07/16/23 Yes [provider]  buPROPion (WELLBUTRIN XL) 300 MG 24 hr tablet Take 300 mg by mouth in the morning and at bedtime. Take with 150 mg to = 450 mg twice daily 06/29/20  Yes [provider]  cyclobenzaprine (FLEXERIL) 10 MG tablet Take 1 tablet by mouth 3 (three) times daily as needed. 01/28/23  Yes [provider]  DULoxetine (CYMBALTA) 60 MG capsule Take 60 mg by mouth 2 (two) times daily. 05/02/21  Yes [provider]  Ferrous Gluconate 324 (37.5 Fe) MG TABS Take 324 mg  by mouth daily. 02/14/20  Yes [provider]  furosemide (LASIX) 20 MG tablet Take 20 mg by mouth daily.   Yes [provider]  hydrochlorothiazide (HYDRODIURIL) 25 MG tablet Take 25 mg by mouth daily. 09/15/21  Yes [provider]  HYDROcodone-acetaminophen (NORCO/VICODIN) 5-325 MG tablet Take 1 tablet by mouth every 8 (eight) hours as needed. 01/20/23  Yes [provider]  ipratropium-albuterol (DUONEB) 0.5-2.5 (3) MG/3ML SOLN Inhale into the lungs. 02/15/23  Yes [provider]  levothyroxine (SYNTHROID) 150 MCG tablet Take 150 mcg by mouth daily before breakfast. 05/18/19  Yes [provider]  naloxone (NARCAN) nasal spray 4 mg/0.1 mL Place into the nose. 08/21/22  Yes [provider]  nystatin (MYCOSTATIN/NYSTOP) powder Apply 1 Application topically 3 (three) times daily. 11/06/22  Yes [provider]  omeprazole (PRILOSEC) 40 MG capsule Take 40 mg by mouth 2 (two) times daily.   Yes [provider]  oxybutynin (DITROPAN XL) 15 MG 24 hr tablet Take 15 mg by mouth daily.  05/31/17  Yes [provider]  pregabalin (LYRICA) 75 MG capsule Take 1 capsule (75 mg total) by mouth 2 (two) times daily. 08/07/22  Yes London Sheer, MD  vitamin B-12 (CYANOCOBALAMIN) 250 MCG tablet Take 250 mcg by mouth daily.   Yes [provider]  cycloSPORINE (RESTASIS) 0.05 % ophthalmic emulsion Place 1 drop into both eyes 2 (two) times daily as needed (dry eyes).    [provider]    Physical Exam: Vitals:   03/31/23 1452 03/31/23 1453 03/31/23 1700 03/31/23 1845  BP:  129/70 (!) 118/58 110/62  Pulse:  80 71 61  Resp:  16 18 20   Temp:  97.6 F (36.4 C)  (!) 97.5 F (36.4 C)  TempSrc:  Oral  Oral  SpO2:  96% 100% 91%  Weight: (!) 167.8 kg     Height: 5\' 1"  (1.549 m)       Constitutional:  Morbidly obese, NAD, calm, comfortable Vitals:   03/31/23 1452  03/31/23 1453 03/31/23 1700 03/31/23 1845  BP:   129/70 (!) 118/58 110/62  Pulse:  80 71 61  Resp:  16 18 20   Temp:  97.6 F (36.4 C)  (!) 97.5 F (36.4 C)  TempSrc:  Oral  Oral  SpO2:  96% 100% 91%  Weight: (!) 167.8 kg     Height: 5\' 1"  (1.549 m)      Eyes: PERRL, lids and conjunctivae normal ENMT: Mucous membranes are moist.  Neck: normal, supple, no masses, no thyromegaly Respiratory: O2 sats frequently dropping down to 50s, clear to auscultation bilaterally, no wheezing, no crackles. Normal respiratory effort. No accessory muscle use.  Cardiovascular: Regular rate and rhythm, 2/6 systolic murmur / no rubs / gallops.  Very minimal/trace bilateral lower extremity edema worse on the right.   Abdomen: obese, no tenderness, no masses palpated. No hepatosplenomegaly. Bowel sounds positive.  Musculoskeletal: no clubbing / cyanosis. No joint deformity upper and lower extremities.  Skin: no rashes, lesions, ulcers. No induration Neurologic: No apparent cranial nerve abnormality, moving all extremities spontaneously Psychiatric: Patient is quite frequently drifts off to sleep during my evaluation, with subsequent hypoxia that sometimes persist when she is transiently awake  Labs on Admission: I have personally reviewed following labs and imaging studies  CBC: Recent Labs  Lab 03/31/23 1526  WBC 6.2  NEUTROABS 3.5  HGB 10.5*  HCT 33.5*  MCV 90.3  PLT 226   Basic Metabolic Panel: Recent Labs  Lab 03/31/23 1526 03/31/23 1845  NA 134*  --   K 2.9*  --   CL 96*  --   CO2 24  --   GLUCOSE 116*  --   BUN 28*  --   CREATININE 2.03*  --   CALCIUM 9.3  --   MG  --  1.8   GFR: Estimated Creatinine Clearance: 47.9 mL/min (A) (by C-G formula based on SCr of 2.03 mg/dL (H)). Liver Function Tests: Recent Labs  Lab 03/31/23 1526  AST 46*  ALT 32  ALKPHOS 73  BILITOT 1.0  PROT 7.4  ALBUMIN 3.8   No results for input(s): "LIPASE", "AMYLASE" in the last 168 hours. Recent Labs  Lab 03/31/23 1845  AMMONIA 12   Urine  analysis:    Component Value Date/Time   COLORURINE YELLOW 03/31/2023 1820   APPEARANCEUR HAZY (A) 03/31/2023 1820   LABSPEC 1.011 03/31/2023 1820   PHURINE 5.0 03/31/2023 1820   GLUCOSEU NEGATIVE 03/31/2023 1820   HGBUR MODERATE (A) 03/31/2023 1820   BILIRUBINUR NEGATIVE 03/31/2023 1820   KETONESUR NEGATIVE 03/31/2023 1820   PROTEINUR NEGATIVE 03/31/2023 1820   UROBILINOGEN 0.2 07/18/2012 1156   NITRITE NEGATIVE 03/31/2023 1820   LEUKOCYTESUR LARGE (A) 03/31/2023 1820    Radiological Exams on Admission: DG Foot Complete Left  Result Date: 03/31/2023 CLINICAL DATA:  Trauma, fall EXAM: LEFT FOOT - COMPLETE 3+ VIEW COMPARISON:  None Available. FINDINGS: No recent fracture or dislocation is seen. Bony spurs are seen in the first metatarsophalangeal joint and interphalangeal joint of big toe. IMPRESSION: No recent fracture or dislocation is seen left foot. Electronically Signed   By: Ernie Avena M.D.   On: 03/31/2023 16:55   DG Knee Complete 4 Views Right  Result Date: 03/31/2023 CLINICAL DATA:  Trauma, fall, pain EXAM: RIGHT KNEE - COMPLETE 4+ VIEW COMPARISON:  None FINDINGS: Previous right knee arthroplasty. No recent fracture or dislocation is seen. Lateral view is technically less than optimal. Smoothly marginated calcifications adjacent to the distal femur may be  residual from previous trauma and previous surgery. IMPRESSION: Previous right knee arthroplasty. No recent fracture or dislocation is seen. Electronically Signed   By: Ernie Avena M.D.   On: 03/31/2023 16:53   DG Knee Complete 4 Views Left  Result Date: 03/31/2023 CLINICAL DATA:  Trauma, fall EXAM: LEFT KNEE - COMPLETE 4+ VIEW COMPARISON:  02/06/2021 FINDINGS: Lateral view is less than optimal. As far as seen, no recent fracture or dislocation is seen. There is previous left knee arthroplasty. Smooth marginated calcification medial to the proximal tibia has not changed. IMPRESSION: No displaced fracture or  dislocation is seen. Previous left knee arthroplasty. Lateral view is less than optimal limiting evaluation. Electronically Signed   By: Ernie Avena M.D.   On: 03/31/2023 16:51   CT Cervical Spine Wo Contrast  Result Date: 03/31/2023 CLINICAL DATA:  Trauma, fall EXAM: CT CERVICAL SPINE WITHOUT CONTRAST TECHNIQUE: Multidetector CT imaging of the cervical spine was performed without intravenous contrast. Multiplanar CT image reconstructions were also generated. RADIATION DOSE REDUCTION: This exam was performed according to the departmental dose-optimization program which includes automated exposure control, adjustment of the mA and/or kV according to patient size and/or use of iterative reconstruction technique. COMPARISON:  None Available. FINDINGS: Examination was technically somewhat limited due to patient's body habitus. Alignment: Alignment of posterior margins of vertebral bodies is within normal limits. Skull base and vertebrae: No recent fracture is seen. Soft tissues and spinal canal: There is no central spinal stenosis. Disc levels: There is mild encroachment of neural foramina from C3-C7 levels. Upper chest: Upper lung fields are unremarkable. Other: None. IMPRESSION: No recent fracture is seen cervical spine. Cervical spondylosis with mild encroachment of neural foramina at multiple levels. Electronically Signed   By: Ernie Avena M.D.   On: 03/31/2023 16:50   CT CHEST ABDOMEN PELVIS WO CONTRAST  Result Date: 03/31/2023 CLINICAL DATA:  Multiple falls.  Polytrauma.  Unable to bear weight. EXAM: CT CHEST, ABDOMEN AND PELVIS WITHOUT CONTRAST TECHNIQUE: Multidetector CT imaging of the chest, abdomen and pelvis was performed following the standard protocol without IV contrast. RADIATION DOSE REDUCTION: This exam was performed according to the departmental dose-optimization program which includes automated exposure control, adjustment of the mA and/or kV according to patient size and/or  use of iterative reconstruction technique. COMPARISON:  Multiple exams, including 11/19/2019 and 12/19/2021 FINDINGS: Body habitus reduces diagnostic sensitivity and specificity. CT CHEST FINDINGS Cardiovascular: Atheromatous vascular calcification of the thoracic aorta. Mediastinum/Nodes: Unremarkable Lungs/Pleura: Mild scarring in the left lower lobe. Musculoskeletal: Left total shoulder prosthesis. Dorsal column stimulator with lead tips running from T7 through T11. Mid and lower thoracic spondylosis. CT ABDOMEN PELVIS FINDINGS Hepatobiliary: Diffuse hepatic steatosis.  Cholecystectomy. Pancreas: Unremarkable Spleen: Unremarkable Adrenals/Urinary Tract: The kidneys and adrenal glands appear normal. There is gas in the wall of the urinary bladder compatible with emphysematous cystitis. Trace amount of gas just outside of bladder wall and medial to the pectineus muscle on images 104 through 105 of series 2. Stomach/Bowel: Unremarkable Vascular/Lymphatic: Mild aortoiliac atherosclerotic vascular calcification. Reproductive: Unremarkable Other: No supplemental non-categorized findings. Musculoskeletal: Left total hip prosthesis. Posterolateral rod and pedicle screw fixation at L4-L5-S1, with interbody solid-appearing fusion at L4-5 and L5-S1. Suspected bilateral foraminal impingement at L3-4 and possibly at L2-3 due to spurring. IMPRESSION: 1. Scattered small amounts of gas in the urinary bladder wall compatible with emphysematous cystitis. Miniscule amount of gas tracking just outside the bladder wall in the right lower quadrant medial to the pectineus muscle, significance uncertain but no  abscess detected. 2. Diffuse hepatic steatosis. 3. Dorsal column stimulator. 4. Suspected bilateral foraminal impingement at L3-4 and possibly at L2-3 due to spurring. Aortic Atherosclerosis (ICD10-I70.0) Electronically Signed   By: Gaylyn Rong M.D.   On: 03/31/2023 16:48   CT Head Wo Contrast  Result Date:  03/31/2023 CLINICAL DATA:  Trauma, fall EXAM: CT HEAD WITHOUT CONTRAST TECHNIQUE: Contiguous axial images were obtained from the base of the skull through the vertex without intravenous contrast. RADIATION DOSE REDUCTION: This exam was performed according to the departmental dose-optimization program which includes automated exposure control, adjustment of the mA and/or kV according to patient size and/or use of iterative reconstruction technique. COMPARISON:  08/30/2010 FINDINGS: Brain: No acute findings are seen within the cranium. There are no signs of bleeding within the cranium. Ventricles are not dilated. Vascular: Unremarkable. Skull: No fracture is seen in cul de sac. Hyperostosis frontalis interna is seen. Sinuses/Orbits: Unremarkable. Other: None. IMPRESSION: No acute intracranial findings are seen in noncontrast CT brain. Electronically Signed   By: Ernie Avena M.D.   On: 03/31/2023 16:39    EKG: None.   Assessment/Plan Principal Problem:   Acute renal failure superimposed on stage 3a chronic kidney disease (HCC) Active Problems:   Morbid (severe) obesity due to excess calories (HCC)   OSA on CPAP   Emphysematous cystitis   Acute metabolic encephalopathy   Hypokalemia   Hypertension   Depression with anxiety   Asthma, chronic   Gout   Assessment and Plan: * Acute renal failure superimposed on stage 3a chronic kidney disease (HCC) Cr-2.03, on CKD stage IIIa.  Baseline creatinine 1.3-1.5.  No GI losses.  But she is on Lasix 20 mg twice daily and HCTZ. -Hold HCTZ and Lasix -1 L bolus given, Continue N/s 100cc/hr x 20hrs - Unable to obtain peripheral IV access, central line right femoral placed in ED by ED provider.   Acute metabolic encephalopathy Patient quite frequently falling asleep several times during examination.  Arouses to touch, wakes up, alert and answers questions appropriately.  Head CT negative for acute abnormality.  Encephalopathy in the setting of AKI,  emphysematous cystitis, morbid obesity with OSA likely OHS , on several psychoactive meds including opiates. Recent frequent falls likely related to above, drowsiness and morbid obesity.  ABG shows pH of 7.4, pCO2 of 48. -CPAP nightly, antibiotics, hydrate -Hold psychoactive medications bupropion, Cymbalta, belsomra, flexeril, Hydrocodone, lyrica -PT eval, may need assistive devices for ambulation  Emphysematous cystitis UA with large leukocytes, few bacteria.  WBC 6.2.  Afebrile.  Rules out for sepsis.  Reports right flank pain.  CT chest abdomen and pelvis without contrast-scattered small amounts of gas near urinary bladder wall compatible with emphysematous cystitis.  Also minuscule amount of gas tracking just outside the bladder wall.  Uncertain significance.  No abscess. -IV ceftriaxone 2 g daily -No recent urine cultures -Add on urine cultures - EDP to Dr. Marlou Porch, no need for urgent procedure -Urology consultation in a.m.   Morbid (severe) obesity due to excess calories (HCC) Morbid obesity with OSA and possibly OHS with daytime hypoxemia.  BMI of 69.9.  Reports increasing lower extremity swelling, no appreciable pitting on exam.   -CPAP nightly -Hold psychoactive medications  Hypokalemia K- 2.9. MAg- 1.8. - Replete  Gout Resume allopurinol  Asthma, chronic Stable. -Resume home regimen  Depression with anxiety Hold Cymbalta bupropion with drowsiness  Hypertension Stable. -Hold HCTZ and Lasix for now with AKI and to allow for hydration   DVT prophylaxis: Heparin  Code Status: FULL code Family Communication: None at bedside Disposition Plan: ~ 2days Consults called: Urology Admission status:  Obs tele     Author: Onnie Boer, MD 03/31/2023 9:40 PM  For on call review [email or web address].

## 2023-03-31 NOTE — ED Notes (Signed)
Lab at bedside

## 2023-04-01 DIAGNOSIS — N1831 Chronic kidney disease, stage 3a: Secondary | ICD-10-CM | POA: Diagnosis not present

## 2023-04-01 DIAGNOSIS — N179 Acute kidney failure, unspecified: Secondary | ICD-10-CM | POA: Diagnosis not present

## 2023-04-01 LAB — BASIC METABOLIC PANEL
Anion gap: 10 (ref 5–15)
BUN: 22 mg/dL — ABNORMAL HIGH (ref 6–20)
CO2: 28 mmol/L (ref 22–32)
Calcium: 8.7 mg/dL — ABNORMAL LOW (ref 8.9–10.3)
Chloride: 98 mmol/L (ref 98–111)
Creatinine, Ser: 1.65 mg/dL — ABNORMAL HIGH (ref 0.44–1.00)
GFR, Estimated: 37 mL/min — ABNORMAL LOW (ref >60)
Glucose, Bld: 131 mg/dL — ABNORMAL HIGH (ref 70–99)
Potassium: 2.8 mmol/L — ABNORMAL LOW (ref 3.5–5.1)
Sodium: 136 mmol/L (ref 135–145)

## 2023-04-01 LAB — HIV ANTIBODY (ROUTINE TESTING W REFLEX): HIV Screen 4th Generation wRfx: NONREACTIVE

## 2023-04-01 LAB — CBC
HCT: 30 % — ABNORMAL LOW (ref 36.0–46.0)
Hemoglobin: 9.5 g/dL — ABNORMAL LOW (ref 12.0–15.0)
MCH: 28.5 pg (ref 26.0–34.0)
MCHC: 31.7 g/dL (ref 30.0–36.0)
MCV: 90.1 fL (ref 80.0–100.0)
Platelets: 216 10*3/uL (ref 150–400)
RBC: 3.33 MIL/uL — ABNORMAL LOW (ref 3.87–5.11)
RDW: 15.4 % (ref 11.5–15.5)
WBC: 4.9 10*3/uL (ref 4.0–10.5)
nRBC: 0 % (ref 0.0–0.2)

## 2023-04-01 MED ORDER — DULOXETINE HCL 60 MG PO CPEP
60.0000 mg | ORAL_CAPSULE | Freq: Two times a day (BID) | ORAL | Status: DC
  Administered 2023-04-01 – 2023-04-03 (×4): 60 mg via ORAL
  Filled 2023-04-01 (×4): qty 1

## 2023-04-01 MED ORDER — HYDROCODONE-ACETAMINOPHEN 7.5-325 MG PO TABS
1.0000 | ORAL_TABLET | ORAL | Status: DC | PRN
  Administered 2023-04-02 (×3): 1 via ORAL
  Administered 2023-04-03: 2 via ORAL
  Administered 2023-04-03: 1 via ORAL
  Filled 2023-04-01: qty 1
  Filled 2023-04-01: qty 2
  Filled 2023-04-01 (×3): qty 1

## 2023-04-01 MED ORDER — HYDROCODONE-ACETAMINOPHEN 7.5-325 MG PO TABS
1.0000 | ORAL_TABLET | Freq: Four times a day (QID) | ORAL | Status: DC | PRN
  Administered 2023-04-01: 1 via ORAL
  Filled 2023-04-01: qty 1

## 2023-04-01 MED ORDER — CYCLOSPORINE 0.05 % OP EMUL: 1.0000 [drp] | Freq: Two times a day (BID) | OPHTHALMIC | Status: DC | PRN

## 2023-04-01 MED ORDER — OXYBUTYNIN CHLORIDE ER 5 MG PO TB24
15.0000 mg | ORAL_TABLET | Freq: Every day | ORAL | Status: DC
  Administered 2023-04-01 – 2023-04-03 (×3): 15 mg via ORAL
  Filled 2023-04-01 (×3): qty 3

## 2023-04-01 MED ORDER — BUPROPION HCL ER (XL) 300 MG PO TB24
450.0000 mg | ORAL_TABLET | Freq: Every day | ORAL | Status: DC
  Administered 2023-04-01 – 2023-04-03 (×3): 450 mg via ORAL
  Filled 2023-04-01 (×3): qty 1

## 2023-04-01 NOTE — [place] FL2 (Signed)
[place] MEDICAID FL2 LEVEL OF CARE FORM     IDENTIFICATION  Patient Name: Marilyn [name] Birthdate: Jan 21, 1971 Sex: female Admission Date (Current Location): 03/31/2023  [place] and [place] Number:  Marilyn [name] [id number] P Facility and Address:  [facility],  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   Acute metabolic encephalopathy 03/31/2023   Lactic acidosis 12/18/2021   Dyspnea on exertion 12/18/2021   Anemia 12/18/2021   Gout 12/18/2021   Acute pain of left shoulder    Arthritis of left shoulder region    S/P reverse total shoulder arthroplasty, left 11/16/2021   Unilateral primary osteoarthritis, right hip 05/09/2021   S/P lumbar spinal fusion 05/09/2021   S/P insertion of spinal cord stimulator 05/09/2021   H/O total knee replacement, bilateral 01/18/2021   Lumbar post-laminectomy syndrome 04/06/2020   Long-term current use of opiate analgesic 11/04/2019   Leg pain, right 10/20/2019   Dysesthesia 10/20/2019   Lateral femoral cutaneous neuropathy, 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 difficile colitis 05/22/2019   Hypophosphatemia 05/21/2019   Syncope 05/20/2019   OSA on CPAP    Acute gastroenteritis    Anemia due to blood loss, acute 06/18/2018   Plantar fasciitis of left foot 06/16/2018   Tendonitis, Achilles, right 06/16/2018   Osteochondral talar dome  lesion 06/16/2018   Deep incisional surgical site infection    Superficial incisional surgical site infection 06/11/2018   Right ankle effusion 06/11/2018   Morbid (severe) obesity due to excess calories (HCC) 05/29/2018   Spondylolisthesis, lumbar region 05/27/2018   Spinal stenosis of lumbar region 05/27/2018   Urinary tract infection 05/27/2018   Fusion of spine of lumbar region 05/27/2018   Pain of right hip joint 07/03/2017   Acute right-sided low back pain with right-sided sciatica 03/27/2017   Chronic right shoulder pain 02/13/2017   History of rotator cuff tear 11/30/2016   Impingement syndrome of right shoulder 11/30/2016   Exacerbation of asthma 07/18/2016   Hypokalemia 07/18/2016   Hypertension 07/18/2016   Depression with anxiety 07/18/2016   Hypothyroidism 07/18/2016   Hyperglycemia 07/18/2016   Asthma, chronic 07/18/2016   Surgical wound dehiscence left hip; questionable superficial infection 11/10/2015   Postoperative wound infection 11/10/2015   Osteoarthritis of left hip 09/09/2015   Status post total replacement of left hip 09/09/2015   Obesity (BMI 35.0-39.9 without comorbidity) 04/28/2013    Orientation RESPIRATION BLADDER Height & Weight     Self, Time, Situation, Place  O2 (2L, cpap) External catheter Weight: (!) 399 lb 4.1 oz (181.1 kg) Height:  5\' 1"  (154.9 cm)  BEHAVIORAL SYMPTOMS/MOOD NEUROLOGICAL BOWEL NUTRITION STATUS      Continent Diet (Heart healthy. See d/c summary for updates.)  AMBULATORY STATUS COMMUNICATION OF NEEDS Skin   Extensive Assist Verbally Normal  Personal Care Assistance Level of Assistance  Bathing, Feeding, Dressing Bathing Assistance: Maximum assistance Feeding assistance: Limited assistance Dressing Assistance: Maximum assistance     Functional Limitations Info  Sight, Hearing, Speech Sight Info: Adequate Hearing Info: Adequate Speech Info: Adequate    SPECIAL CARE FACTORS FREQUENCY  PT  (By licensed PT)     PT Frequency: 5x weekly              Contractures      Additional Factors Info  Code Status, Allergies, Psychotropic Code Status Info: Full code Allergies Info: Lisinopril  Bee Venom  Bupropion  Latex  Propoxyphene  Methadone  Codeine  Meloxicam  Morphine And Codeine  Tegaderm Ag Mesh (Silver)  Tomato Psychotropic Info: Wellbutrin, Cymbalta         Current Medications (04/01/2023):  This is the current hospital active medication list Current Facility-Administered Medications  Medication Dose Route Frequency Provider Last Rate Last Admin   0.9 %  sodium chloride infusion   Intravenous Continuous Emokpae, Ejiroghene E, MD 100 mL/hr at 03/31/23 2321 New Bag at 03/31/23 2321   acetaminophen (TYLENOL) tablet 650 mg  650 mg Oral Q6H PRN Emokpae, Ejiroghene E, MD       Or   acetaminophen (TYLENOL) suppository 650 mg  650 mg Rectal Q6H PRN Emokpae, Ejiroghene E, MD       allopurinol (ZYLOPRIM) tablet 100 mg  100 mg Oral BID Emokpae, Ejiroghene E, MD   100 mg at 04/01/23 1129   cefTRIAXone (ROCEPHIN) 2 g in sodium chloride 0.9 % 100 mL IVPB  2 g Intravenous Q24H Emokpae, Ejiroghene E, MD 200 mL/hr at 03/31/23 2325 2 g at 03/31/23 2325   heparin injection 5,000 Units  5,000 Units Subcutaneous Q8H Emokpae, Ejiroghene E, MD   5,000 Units at 04/01/23 0544   ipratropium-albuterol (DUONEB) 0.5-2.5 (3) MG/3ML nebulizer solution 3 mL  3 mL Nebulization Q4H PRN Emokpae, Ejiroghene E, MD       levothyroxine (SYNTHROID) tablet 150 mcg  150 mcg Oral Q0600 Emokpae, Ejiroghene E, MD   150 mcg at 04/01/23 0544   ondansetron (ZOFRAN) tablet 4 mg  4 mg Oral Q8H PRN Emokpae, Ejiroghene E, MD       Or   ondansetron (ZOFRAN) injection 4 mg  4 mg Intravenous Q8H PRN Emokpae, Ejiroghene E, MD       pantoprazole (PROTONIX) EC tablet 40 mg  40 mg Oral BID Emokpae, Ejiroghene E, MD   40 mg at 04/01/23 1129   polyethylene glycol (MIRALAX / GLYCOLAX) packet 17 g  17 g Oral Daily PRN Emokpae,  Ejiroghene E, MD         Discharge Medications: Please see discharge summary for a list of discharge medications.  Relevant Imaging Results:  Relevant Lab Results:   Additional Information SSN: [id number].  Karn Cassis, LCSW

## 2023-04-01 NOTE — Plan of Care (Signed)
  Problem: Acute Rehab PT Goals(only PT should resolve) Goal: Pt Will Go Supine/Side To Sit Outcome: Progressing Flowsheets (Taken 04/01/2023 1417) Pt will go Supine/Side to Sit:  with minimal assist  with min guard assist Goal: Patient Will Transfer Sit To/From Stand Outcome: Progressing Flowsheets (Taken 04/01/2023 1417) Patient will transfer sit to/from stand:  with min guard assist  with minimal assist Goal: Pt Will Transfer Bed To Chair/Chair To Bed Outcome: Progressing Flowsheets (Taken 04/01/2023 1417) Pt will Transfer Bed to Chair/Chair to Bed:  min guard assist  with min assist Goal: Pt Will Ambulate Outcome: Progressing Flowsheets (Taken 04/01/2023 1417) Pt will Ambulate:  25 feet  with minimal assist  with moderate assist  with rolling walker   2:17 PM, 04/01/23 Ocie Bob, MPT Physical Therapist with [facility] 336 [phone] office [phone] mobile phone

## 2023-04-01 NOTE — Evaluation (Signed)
Physical Therapy Evaluation Patient Details Name: Marilyn [name] MRN: [id number] DOB: [birth year] Today's Date: 04/01/2023  History of Present Illness  Marilyn [name] is a 52 y.o. female with medical history significant for morbid obesity, OSA on CPAP, hypertension, asthma.  Patient presented to the ED with complaints of falls over the past 3 to 4 weeks.  She reports a total of 4 falls.  She denies dizziness or passing out.  She did not hit her head.  All the falls are related to mechanical issue, tripping, and the balance when bending over and subsequently falling.  She reports good oral intake, no vomiting no loose stools.  She has been compliant with Lasix 20 mg twice daily and HCTZ.  She reports swelling to her bilateral lower extremities over the past 3 to 4 weeks, making it difficult for her to walk.  She denies pain with urination, denied frequency of abdominal pain.  But complains of right flank pain.  No difficulty breathing.  No chest pain.  No fevers no chills.  She reports increased sleepiness over the past few days.   Clinical Impression  Patient demonstrates slow labored movement for sitting up at bedside with c/o severe pain with pressure, movement of BLE, limited to a few side steps at bedside before having to sit due to generalized weakness and fall risk.  Patient tolerated sitting up in chair after therapy.  Patient will benefit from continued skilled physical therapy in hospital and recommended venue below to increase strength, balance, endurance for safe ADLs and gait.           Assistance Recommended at Discharge Set up Supervision/Assistance  If plan is discharge home, recommend the following:  Can travel by private vehicle  A lot of help with bathing/dressing/bathroom;A lot of help with walking and/or transfers;Help with stairs or ramp for entrance;Assistance with cooking/housework   No    Equipment Recommendations None recommended by PT  Recommendations for Other  Services       Functional Status Assessment Patient has had a recent decline in their functional status and demonstrates the ability to make significant improvements in function in a reasonable and predictable amount of time.     Precautions / Restrictions Precautions Precautions: Fall Restrictions Weight Bearing Restrictions: No      Mobility  Bed Mobility Overal bed mobility: Needs Assistance Bed Mobility: Supine to Sit     Supine to sit: Mod assist     General bed mobility comments: increased time, labored movement with c/o severe pain BLE    Transfers Overall transfer level: Needs assistance Equipment used: Rolling walker (2 wheels) Transfers: Sit to/from Stand, Bed to chair/wheelchair/BSC Sit to Stand: Min assist, Mod assist   Step pivot transfers: Min assist, Mod assist       General transfer comment: increased time, labored movement    Ambulation/Gait Ambulation/Gait assistance: Mod assist Gait Distance (Feet): 5 Feet Assistive device: Rolling walker (2 wheels) Gait Pattern/deviations: Decreased step length - right, Decreased step length - left, Decreased stride length Gait velocity: slow     General Gait Details: limited to a few slow labored side steps at bedside before having to sit due to fatigue and generalized weakness  Stairs            Wheelchair Mobility     Tilt Bed    Modified Rankin (Stroke Patients Only)       Balance Overall balance assessment: Needs assistance Sitting-balance support: Feet supported, No upper extremity supported Sitting balance-Leahy  Scale: Fair Sitting balance - Comments: fair/good seated at EOB   Standing balance support: Reliant on assistive device for balance, During functional activity, Bilateral upper extremity supported Standing balance-Leahy Scale: Poor Standing balance comment: fair/poor using RW                             Pertinent Vitals/Pain Pain Assessment Pain Assessment:  Faces Faces Pain Scale: Hurts even more Pain Location: BLE Pain Descriptors / Indicators: Sore, Grimacing, Guarding Pain Intervention(s): Limited activity within patient's tolerance, Monitored during session, Repositioned    Home Living Family/patient expects to be discharged to:: Private residence Living Arrangements: Parent Available Help at Discharge: Family;Available PRN/intermittently Type of Home: House Home Access: Ramped entrance       Home Layout: One level Home Equipment: Cane - single point;Grab bars - tub/shower;Hand held shower head;BSC/3in1      Prior Function Prior Level of Function : Independent/Modified Independent             Mobility Comments: household and short distanced community ambulator using SPC (hurrycane) ADLs Comments: Independent     Hand Dominance   Dominant Hand: Right    Extremity/Trunk Assessment   Upper Extremity Assessment Upper Extremity Assessment: Generalized weakness    Lower Extremity Assessment Lower Extremity Assessment: Generalized weakness    Cervical / Trunk Assessment Cervical / Trunk Assessment: Normal  Communication   Communication: No difficulties  Cognition Arousal/Alertness: Awake/alert Behavior During Therapy: WFL for tasks assessed/performed Overall Cognitive Status: Within Functional Limits for tasks assessed                                          General Comments      Exercises     Assessment/Plan    PT Assessment Patient needs continued PT services  PT Problem List Decreased strength;Decreased activity tolerance;Decreased balance;Decreased mobility       PT Treatment Interventions DME instruction;Gait training;Stair training;Functional mobility training;Therapeutic activities;Therapeutic exercise;Patient/family education;Balance training    PT Goals (Current goals can be found in the Care Plan section)  Acute Rehab PT Goals Patient Stated Goal: return home with family to  assist PT Goal Formulation: With patient Time For Goal Achievement: 04/15/23 Potential to Achieve Goals: Good    Frequency Min 3X/week     Co-evaluation               AM-PAC PT "6 Clicks" Mobility  Outcome Measure Help needed turning from your back to your side while in a flat bed without using bedrails?: A Little Help needed moving from lying on your back to sitting on the side of a flat bed without using bedrails?: A Little Help needed moving to and from a bed to a chair (including a wheelchair)?: A Little Help needed standing up from a chair using your arms (e.g., wheelchair or bedside chair)?: A Little Help needed to walk in hospital room?: A Little Help needed climbing 3-5 steps with a railing? : A Lot 6 Click Score: 17    End of Session   Activity Tolerance: Patient tolerated treatment well;Patient limited by fatigue Patient left: in chair;with call bell/phone within reach Nurse Communication: Mobility status PT Visit Diagnosis: Unsteadiness on feet (R26.81);Other abnormalities of gait and mobility (R26.89);Muscle weakness (generalized) (M62.81)    Time: 1610-9604 PT Time Calculation (min) (ACUTE ONLY): 33 min   Charges:  PT Evaluation $PT Eval Moderate Complexity: 1 Mod PT Treatments $Therapeutic Activity: 23-37 mins PT General Charges $$ ACUTE PT VISIT: 1 Visit         2:15 PM, 04/01/23 Ocie Bob, MPT Physical Therapist with [facility] [phone] office [phone] mobile phone

## 2023-04-01 NOTE — TOC Initial Note (Signed)
Transition of Care [facility]) - Initial/Assessment Note    Patient Details  Name: Marilyn [name] MRN: [id number] Date of Birth: [birth year]  [facility] CM/SW Contact:    Karn Cassis, LCSW Phone Number: 04/01/2023, 1:56 PM  Clinical Narrative: PT evaluated pt and recommend SNF. Discussed with pt who is agreeable. Reviewed Medicare.gov ratings. Will initiate bed search and insurance authorization.                   Expected Discharge Plan: Skilled Nursing Facility Barriers to Discharge: Continued Medical Work up   Patient Goals and CMS Choice Patient states their goals for this hospitalization and ongoing recovery are:: short term rehab   Choice offered to / list presented to : Patient  ownership interest in [facility].provided to:: Patient    Expected Discharge Plan and Services In-house Referral: Clinical Social Work   Post Acute Care Choice: Skilled Nursing Facility Living arrangements for the past 2 months: Single Family Home                                      Prior Living Arrangements/Services Living arrangements for the past 2 months: Single Family Home Lives with:: Parents Patient language and need for interpreter reviewed:: Yes Do you feel safe going back to the place where you live?: Yes      Need for Family Participation in Patient Care: No (Comment)     Criminal Activity/Legal Involvement Pertinent to Current Situation/Hospitalization: No - Comment as needed  Activities of Daily Living Home Assistive Devices/Equipment: Cane (specify quad or straight) ADL Screening (condition at time of admission) Patient's cognitive ability adequate to safely complete daily activities?: Yes Is the patient deaf or have difficulty hearing?: No Does the patient have difficulty seeing, even when wearing glasses/contacts?: No Does the patient have difficulty concentrating, remembering, or making decisions?: No Patient able to  express need for assistance with ADLs?: Yes Does the patient have difficulty dressing or bathing?: No Independently performs ADLs?: Yes (appropriate for developmental age) Does the patient have difficulty walking or climbing stairs?: No Weakness of Legs: Both Weakness of Arms/Hands: None  Permission Sought/Granted                  Emotional Assessment     Affect (typically observed): Appropriate Orientation: : Oriented to Self, Oriented to Place, Oriented to  Time, Oriented to Situation Alcohol / Substance Use: Not Applicable Psych Involvement: No (comment)  Admission diagnosis:  Hypokalemia [E87.6] AKI (acute kidney injury) (HCC) [N17.9] Recurrent falls [R29.6] Patient Active Problem List   Diagnosis Date Noted   Acute renal failure superimposed on stage 3a chronic kidney disease (HCC) 03/31/2023   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   Acute metabolic encephalopathy 03/31/2023   Lactic acidosis 12/18/2021   Dyspnea on exertion 12/18/2021   Anemia 12/18/2021   Gout 12/18/2021   Acute pain of left shoulder    Arthritis of left shoulder region    S/P reverse total shoulder arthroplasty, left 11/16/2021   Unilateral primary osteoarthritis, right hip 05/09/2021   S/P lumbar spinal fusion 05/09/2021   S/P insertion of spinal cord stimulator 05/09/2021   H/O total knee replacement, bilateral 01/18/2021   Lumbar post-laminectomy syndrome 04/06/2020   Long-term current use of opiate analgesic 11/04/2019   Leg pain, right 10/20/2019   Dysesthesia 10/20/2019   Lateral femoral cutaneous neuropathy, right 10/20/2019   Other spondylosis  with radiculopathy, lumbar region    Status post lumbar laminectomy 06/23/2019   C. difficile colitis 05/22/2019   Hypophosphatemia 05/21/2019   Syncope 05/20/2019   OSA on CPAP    Acute gastroenteritis    Anemia due to blood loss, acute 06/18/2018    Class: Acute   Plantar fasciitis of left foot 06/16/2018    Class: Chronic   Tendonitis,  Achilles, right 06/16/2018    Class: Chronic   Osteochondral talar dome lesion 06/16/2018    Class: Chronic   Deep incisional surgical site infection    Superficial incisional surgical site infection 06/11/2018    Class: Acute   Right ankle effusion 06/11/2018    Class: Acute   Morbid (severe) obesity due to excess calories (HCC) 05/29/2018    Class: Chronic   Spondylolisthesis, lumbar region 05/27/2018    Class: Chronic   Spinal stenosis of lumbar region 05/27/2018    Class: Chronic   Urinary tract infection 05/27/2018    Class: Acute   Fusion of spine of lumbar region 05/27/2018   Pain of right hip joint 07/03/2017   Acute right-sided low back pain with right-sided sciatica 03/27/2017   Chronic right shoulder pain 02/13/2017   History of rotator cuff tear 11/30/2016   Impingement syndrome of right shoulder 11/30/2016   Exacerbation of asthma 07/18/2016   Hypokalemia 07/18/2016   Hypertension 07/18/2016   Depression with anxiety 07/18/2016   Hypothyroidism 07/18/2016   Hyperglycemia 07/18/2016   Asthma, chronic 07/18/2016   Surgical wound dehiscence left hip; questionable superficial infection 11/10/2015   Postoperative wound infection 11/10/2015   Osteoarthritis of left hip 09/09/2015   Status post total replacement of left hip 09/09/2015   Obesity (BMI 35.0-39.9 without comorbidity) 04/28/2013   PCP:  Rebecka Apley, NP Pharmacy:   [facility] [address], [place] - [address] [place] [address] Phone: [phone] Fax: [phone]     Social Determinants of Health (SDOH) Social History: SDOH Screenings   Food Insecurity: No Food Insecurity (03/31/2023)  Recent Concern: Food Insecurity - Food Insecurity Present (03/24/2023)   Received from [facility], [facility]  Housing: Low Risk  (03/31/2023)  Transportation Needs: No Transportation Needs (03/31/2023)  Recent Concern: Transportation Needs - Unmet Transportation Needs  (01/18/2023)   Received from [facility], [facility]  Utilities: Not At Risk (03/31/2023)  Depression (PHQ2-9): Low Risk  (09/18/2018)  Financial Resource Strain: High Risk (03/24/2023)   Received from [facility], [facility]  Physical Activity: Insufficiently Active (03/24/2023)   Received from [facility], [facility]  Social Connections: Socially Isolated (03/24/2023)   Received from [facility], [facility]  Stress: Stress Concern Present (03/24/2023)   Received from [facility], [facility]  Tobacco Use: Medium Risk (03/31/2023)   SDOH Interventions:     Readmission Risk Interventions     No data to display

## 2023-04-01 NOTE — Progress Notes (Signed)
Pt complain of pain on right side. Describes pain as shooting and sharp in femoral area. Stopped infusion and flushed line and received blood return.  Notified MD. MD requested for Femoral line to be removed after IV antibiotic given later on tonight. Request for IV team to place peripheral line ordered.

## 2023-04-01 NOTE — Progress Notes (Addendum)
[facility] PROGRESS NOTE  Marilyn [name] (DOB: 1970-09-23) ATF:[id number] PCP: Rebecka Apley, NP   Brief Narrative: Marilyn [name] is a 52 y.o. female with a history of obesity, OSA on CPAP, HTN, asthma who presented to the ED on 03/31/2023 due to trips/falls at home becoming more frequent. She has chronic low back pain and sciatica for which she is followed by orthopedic surgery. She is not currently a surgical candidate. She's also followed by the lymphedema clinic, takes lasix as recommended, also no HCTZ for HTN. In the ED, after having pain medications given, she was somnolent, difficult to rouse. VBG showed compensated respiratory acidosis. Work up further included CT head which was nonacute and CT abd/pelvis which showed emphysematous cystitis. Antibiotics started and PT evaluation sought. Will require SNF rehabilitation for safe disposition.  Subjective: Does not have much assistance at home, feels better than at admission generally.   Objective: BP (!) 114/42 (BP Location: Right Wrist)   Pulse 70   Temp 98.2 F (36.8 C) (Oral)   Resp 16   Ht 5\' 1"  (1.549 m)   Wt (!) 181.1 kg   LMP  (LMP Unknown) Comment: tubal ligation  SpO2 99%   BMI 75.44 kg/m   Gen: Obese female in no acute distress Pulm: Clear, distant, nonlabored, normal rate  CV: RRR, distant, trace pitting edema GI: Soft, NT, ND, +BS  Neuro: Alert and oriented. No new focal deficits. Ext: Warm, no deformities. Skin: No acute rashes, lesions or ulcers on visualized skin   Assessment & Plan: Principal Problem:   Acute renal failure superimposed on stage 3a chronic kidney disease (HCC) Active Problems:   Morbid (severe) obesity due to excess calories (HCC)   OSA on CPAP   Emphysematous cystitis   Acute metabolic encephalopathy   Hypokalemia   Hypertension   Depression with anxiety   Asthma, chronic   Gout  Emphysematous cystitis:  - Continue ceftriaxone - Urine culture pending - Urology,  Dr. Marlou Porch, consulted by EDP.   Acute metabolic encephalopathy: Improved while holding sedating medications.  - Minimize sedating medications, at risk for CO2 retention/hypoventilation. Holding belsomra, flexeril, norco, lyrica for now.  OSA:  - CPAP qHS, suspect OHS as well.   Degenerative lumbar arthropathy: s/p instrumentation. Chronic pain on chronic opioid Tx confirmed by PDMP review - Continue long term opioid as per PDMP review. - Follow up with Dr. Christell Constant or second opinion orthopedic surgery after discharge. No LE weakness.  Morbid obesity: Body mass index is 75.44 kg/m. This has been said to make her not a surgical candidate - Suggest PCP to follow up, consider GLP1 agonist.   Frequent falls: CT head without acute findings. Nonfocal neurological exam. Suspect growing deconditioning is contributing with acute infection precipitating worsening now.  - PT evaluation > seek SNF rehabilitation.   Hypokalemia: Supplemented, repeat supplementation, avoid loop and thiazide diuretics.  Poor IV access:  - IV team consult - Remove femoral line (order placed).   Lymphedema:  - Will plan to restart lasix once AKI completely resolved (likely 7/16) and DC HCTZ to avoid hypokalemia.  AKI:  - Improving, Cr 2 > 1.65, baseline is ~1.3. Avoid nephrotoxins, hold diuretics.   HTN: BP not elevated, continue holding HCTZ, lasix  Depression, anxiety:  - Restart cymbalta, bupropion  Hypothyroidism:  - Check TSH given falls - Continue synthroid  GERD:  - PPI  Asthma: Quiescent, no wheezing.  - Continue current Tx.   Gout: Quiescent - Continue allopurinol  Alycia Rossetti  Dario Guardian, MD [facility] [email or web address] 04/01/2023, 3:14 PM

## 2023-04-01 NOTE — Progress Notes (Signed)
Pt admitted due to acute renal failure superimposed on stage 3a chronic kidney disease. Pt lives with her parents and is independent with ADLs. No needs reported at this time. TOC will follow.     04/01/23 1031  TOC Brief Assessment  Insurance and Status Reviewed  Patient has primary care physician Yes  Home environment has been reviewed Lives with parents.  Prior level of function: Independent.  Prior/Current Home Services No current home services  Social Determinants of Health Reivew SDOH reviewed no interventions necessary  Readmission risk has been reviewed Yes  Transition of care needs no transition of care needs at this time

## 2023-04-02 DIAGNOSIS — N179 Acute kidney failure, unspecified: Secondary | ICD-10-CM | POA: Diagnosis not present

## 2023-04-02 DIAGNOSIS — N1831 Chronic kidney disease, stage 3a: Secondary | ICD-10-CM | POA: Diagnosis not present

## 2023-04-02 LAB — BASIC METABOLIC PANEL
Anion gap: 10 (ref 5–15)
BUN: 17 mg/dL (ref 6–20)
CO2: 27 mmol/L (ref 22–32)
Calcium: 8.9 mg/dL (ref 8.9–10.3)
Chloride: 102 mmol/L (ref 98–111)
Creatinine, Ser: 1.32 mg/dL — ABNORMAL HIGH (ref 0.44–1.00)
GFR, Estimated: 48 mL/min — ABNORMAL LOW (ref >60)
Glucose, Bld: 116 mg/dL — ABNORMAL HIGH (ref 70–99)
Potassium: 3.1 mmol/L — ABNORMAL LOW (ref 3.5–5.1)
Sodium: 139 mmol/L (ref 135–145)

## 2023-04-02 LAB — TSH: TSH: 5.09 u[IU]/mL — ABNORMAL HIGH (ref 0.350–4.500)

## 2023-04-02 MED ORDER — POTASSIUM CHLORIDE CRYS ER 20 MEQ PO TBCR
40.0000 meq | EXTENDED_RELEASE_TABLET | Freq: Once | ORAL | Status: AC
  Administered 2023-04-02: 40 meq via ORAL
  Filled 2023-04-02: qty 2

## 2023-04-02 MED ORDER — CEPHALEXIN 500 MG PO CAPS
500.0000 mg | ORAL_CAPSULE | Freq: Three times a day (TID) | ORAL | Status: DC
  Administered 2023-04-02 – 2023-04-03 (×3): 500 mg via ORAL
  Filled 2023-04-02 (×3): qty 1

## 2023-04-02 NOTE — TOC Progression Note (Addendum)
Transition of Care [facility]) - Progression Note    Patient Details  Name: Marilyn [name] MRN: [id number] Date of Birth: Feb 06, 1971  Transition [facility]) CM/SW Contact  Villa Herb, [place] Phone Number: 04/02/2023, 11:07 AM  Clinical Narrative:    CSW spoke with pt to review bed offers. At this time pt accepts bed offer at [facility]. Insurance Berkley Harvey is pending, facility added. TOC to follow.   Addendum 1:45: CSW spoke to [place] with [facility] to update that pts Berkley Harvey has been approved for SNF at their facility. Eunice Blase is going to see if there is a bed for pt tomorrow. TOC to follow.   Expected Discharge Plan: Skilled Nursing Facility Barriers to Discharge: Continued Medical Work up  Expected Discharge Plan and Services In-house Referral: Clinical Social Work   Post Acute Care Choice: Skilled Nursing Facility Living arrangements for the past 2 months: Single Family Home                                       Social Determinants of Health (SDOH) Interventions SDOH Screenings   Food Insecurity: No Food Insecurity (03/31/2023)  Recent Concern: Food Insecurity - Food Insecurity Present (03/24/2023)   Received from [facility], [facility]  Housing: Low Risk  (03/31/2023)  Transportation Needs: No Transportation Needs (03/31/2023)  Recent Concern: Transportation Needs - Unmet Transportation Needs (01/18/2023)   Received from [facility], [facility]  Utilities: Not At Risk (03/31/2023)  Depression (PHQ2-9): Low Risk  (09/18/2018)  Financial Resource Strain: High Risk (03/24/2023)   Received from [facility], [facility]  Physical Activity: Insufficiently Active (03/24/2023)   Received from [facility], [facility]  Social Connections: Socially Isolated (03/24/2023)   Received from [facility], [facility]  Stress: Stress Concern Present (03/24/2023)   Received from [facility], [facility]  Tobacco Use: Medium Risk (03/31/2023)    Readmission  Risk Interventions     No data to display

## 2023-04-02 NOTE — Progress Notes (Addendum)
Mobility Specialist Progress Note:    04/02/23 1213  Mobility  Activity Ambulated with assistance in hallway  Level of Assistance Contact guard assist, steadying assist  Assistive Device Front wheel walker  Distance Ambulated (ft) 40 ft  Range of Motion/Exercises Active;All extremities  Activity Response Tolerated well  Mobility Referral Yes  $Mobility charge 1 Mobility  Mobility Specialist Start Time (ACUTE ONLY) 1200  Mobility Specialist Stop Time (ACUTE ONLY) 1210  Mobility Specialist Time Calculation (min) (ACUTE ONLY) 10 min   Pt received in chair, agreeable to mobility session. Tolerated well, audible SOB throughout. Pt c/o back pain. Ambulated in hallway with RW and MinG for safety. Returned pt to chair in room, all needs met, call bell in reach.   Feliciana Rossetti Mobility Specialist Please contact via Special educational needs teacher or  Rehab office at [phone]

## 2023-04-02 NOTE — Progress Notes (Signed)
Patient received IV antibiotic with no complaints. Patient did complain of pain to right thigh when rolling her. CVC lines flush with blood return noted. Norco given once this shift per patient request for pain medication for stomach. Patient slept on and off this shift. Took medication without any issue. Continued to monitor.

## 2023-04-02 NOTE — Progress Notes (Signed)
[facility] PROGRESS NOTE  Marilyn [name] (DOB: November 19, 1970) ZOX:[id number] PCP: Rebecka Apley, NP   Brief Narrative: Marilyn [name] is a 52 y.o. female with a history of obesity, OSA on CPAP, HTN, asthma who presented to the ED on 03/31/2023 due to trips/falls at home becoming more frequent. She has chronic low back pain and sciatica for which she is followed by orthopedic surgery. She is not currently a surgical candidate. She's also followed by the lymphedema clinic, takes lasix as recommended, also no HCTZ for HTN. In the ED, after having pain medications given, she was somnolent, difficult to rouse. VBG showed compensated respiratory acidosis. Work up further included CT head which was nonacute and CT abd/pelvis which showed emphysematous cystitis. Antibiotics started and PT evaluation sought. Will require SNF rehabilitation for safe disposition.  The patient is medically stabilized for discharge to SNF at this time.  Subjective: No new events, but remains weaker than baseline, amenable to PT at [facility]. Daughter on FaceTime during encounter.  Objective: BP (!) 113/53 (BP Location: Right Arm)   Pulse 66   Temp 98.7 F (37.1 C) (Oral)   Resp 18   Ht 5\' 1"  (1.549 m)   Wt (!) 181.1 kg   LMP  (LMP Unknown) Comment: tubal ligation  SpO2 100%   BMI 75.44 kg/m   Gen: Obese pleasant female chatting on phone with BiPAP on in no distress Pulm: Clear, nonlabored  CV: RRR, no MRG, distant GI: Soft, NT, ND, +BS Neuro: Alert and oriented. No new focal deficits. Ext: Warm, no deformities. Skin: R femoral CVC removed. No other/new rashes, lesions or ulcers on visualized skin   Assessment & Plan: Principal Problem:   Acute renal failure superimposed on stage 3a chronic kidney disease (HCC) Active Problems:   Morbid (severe) obesity due to excess calories (HCC)   OSA on CPAP   Emphysematous cystitis   Acute metabolic encephalopathy   Hypokalemia   Hypertension   Depression  with anxiety   Asthma, chronic   Gout  Emphysematous cystitis:  - Convert ceftriaxone > keflex pending urine culture data.  - Urology, Dr. Marlou Porch, consulted by EDP. No changes to management recommended.  Acute metabolic encephalopathy: Improved while holding sedating medications.  - Minimize sedating medications, at risk for CO2 retention/hypoventilation. Holding belsomra, flexeril, lyrica for now.  OSA:  - CPAP qHS, suspect OHS as well.   Degenerative lumbar arthropathy: s/p instrumentation. Chronic pain on chronic opioid Tx confirmed by PDMP review - Continue long term opioid as per PDMP review - norco ordered. - Follow up with Dr. Christell Constant or second opinion orthopedic surgery after discharge. No LE weakness.  Morbid obesity: Body mass index is 75.44 kg/m. This has been said to make her not a surgical candidate - Suggest PCP to follow up, consider GLP1 agonist.   Frequent falls: CT head without acute findings. Nonfocal neurological exam. Suspect growing deconditioning is contributing with acute infection precipitating worsening now.  - PT evaluation > seek SNF rehabilitation.   Hypokalemia:  - Repeat supplementation  Poor IV access: No need for IV access as she's taking po and stable for discharge. Had femoral CVC placed on admission, removed 7/16.  Lymphedema:  - Will plan to restart lasix once AKI completely resolved (likely 7/16) and DC HCTZ to avoid hypokalemia.  AKI:  - Improving, Cr 2 > 1.65 > 1.32 which is baseline. DC IVF. Restart lasix 7/17.   HTN: BP not elevated, continue holding HCTZ, lasix today  Depression, anxiety:  -  Restarted cymbalta, bupropion  Hypothyroidism:  - TSH pending - Continue synthroid  GERD:  - PPI  Asthma: Quiescent, no wheezing.  - Continue current Tx.   Gout: Quiescent - Continue allopurinol  Tyrone Nine, MD [facility] [email or web address] 04/02/2023, 10:22 AM

## 2023-04-02 NOTE — Progress Notes (Signed)
Patient ambulated with assistance in hallway by Mobility Specialist. Patient transferred from chair to bed. Verbalized some complaints of pain to back PRN given, see MAR. Patients CVC removed from right femoral by Lucia Bitter.

## 2023-04-03 DIAGNOSIS — N179 Acute kidney failure, unspecified: Secondary | ICD-10-CM | POA: Diagnosis not present

## 2023-04-03 DIAGNOSIS — N1831 Chronic kidney disease, stage 3a: Secondary | ICD-10-CM | POA: Diagnosis not present

## 2023-04-03 LAB — URINE CULTURE: Culture: >=100000 — AB

## 2023-04-03 MED ORDER — CEPHALEXIN 500 MG PO CAPS: 500.0000 mg | ORAL_CAPSULE | Freq: Three times a day (TID) | ORAL | 0 refills | Status: AC

## 2023-04-03 MED ORDER — POTASSIUM CHLORIDE CRYS ER 20 MEQ PO TBCR
40.0000 meq | EXTENDED_RELEASE_TABLET | Freq: Once | ORAL | Status: AC
  Administered 2023-04-03: 40 meq via ORAL
  Filled 2023-04-03: qty 2

## 2023-04-03 MED ORDER — ORAL CARE MOUTH RINSE: 15.0000 mL | OROMUCOSAL | Status: DC | PRN

## 2023-04-03 NOTE — Plan of Care (Signed)

## 2023-04-03 NOTE — Progress Notes (Addendum)
Mobility Specialist Progress Note:   04/03/23 1015  Mobility  Activity Ambulated with assistance in room;Ambulated with assistance in hallway  Level of Assistance Contact guard assist, steadying assist  Assistive Device Front wheel walker  Distance Ambulated (ft) 45 ft  Range of Motion/Exercises Active;All extremities  Activity Response Tolerated well  Mobility Referral Yes  $Mobility charge 1 Mobility  Mobility Specialist Start Time (ACUTE ONLY) 0930  Mobility Specialist Stop Time (ACUTE ONLY) 0945  Mobility Specialist Time Calculation (min) (ACUTE ONLY) 15 min   Pt received sitting up in chair, agreeable to mobility session. Ambulated in room and hallway with RW, CGA for safety. Tolerated well, had labored cadence. Took 1 standing rest break d/t fatigue, pt c/o back and hip pain. Returned to room, sitting up in chair, call bell in reach, all needs met.   Feliciana Rossetti Mobility Specialist Please contact via Special educational needs teacher or  Rehab office at [phone]

## 2023-04-03 NOTE — TOC Progression Note (Signed)
Transition of Care [facility]) - Progression Note    Patient Details  Name: Marilyn [name] MRN: [id number] Date of Birth: 1971/06/04  Transition of Care [facility]) CM/SW Contact  Leitha Bleak, RN Phone Number: 04/03/2023, 9:54 AM  Clinical Narrative:   [facility] will not have a female bed until Friday. Team updated. TOC checking AUTH dates.  Expected Discharge Plan: Skilled Nursing Facility Barriers to Discharge: Continued Medical Work up  Expected Discharge Plan and Services In-house Referral: Clinical Social Work   Post Acute Care Choice: Skilled Nursing Facility Living arrangements for the past 2 months: Single Family Home                                       Social Determinants of Health (SDOH) Interventions SDOH Screenings   Food Insecurity: No Food Insecurity (03/31/2023)  Recent Concern: Food Insecurity - Food Insecurity Present (03/24/2023)   Received from [facility], [facility]  Housing: Low Risk  (03/31/2023)  Transportation Needs: No Transportation Needs (03/31/2023)  Recent Concern: Transportation Needs - Unmet Transportation Needs (01/18/2023)   Received from [facility], [facility]  Utilities: Not At Risk (03/31/2023)  Depression (PHQ2-9): Low Risk  (09/18/2018)  Financial Resource Strain: High Risk (03/24/2023)   Received from [facility], [facility]  Physical Activity: Insufficiently Active (03/24/2023)   Received from [facility], [facility]  Social Connections: Socially Isolated (03/24/2023)   Received from [facility], [facility]  Stress: Stress Concern Present (03/24/2023)   Received from [facility], [facility]  Tobacco Use: Medium Risk (03/31/2023)    Readmission Risk Interventions     No data to display

## 2023-04-03 NOTE — Discharge Summary (Signed)
Physician Discharge Summary   Patient: Marilyn [name] MRN: [id number] DOB: [birth year]  Admit date:     03/31/2023  Discharge date: 04/03/23  Discharge Physician: Kendell Bane   PCP: Rebecka Apley, NP   Recommendations at discharge:    Follow-up with a PCP in 2-4 weeks Follow-up with urologist in 4-6 weeks Fall precaution, aggressive PT OT  Discharge Diagnoses: Principal Problem:   Acute renal failure superimposed on stage 3a chronic kidney disease (HCC) Active Problems:   Morbid (severe) obesity due to excess calories (HCC)   OSA on CPAP   Emphysematous cystitis   Acute metabolic encephalopathy   Hypokalemia   Hypertension   Depression with anxiety   Asthma, chronic   Gout  Marilyn [name] is a 52 y.o. female with a history of obesity, OSA on CPAP, HTN, asthma who presented to the ED on 03/31/2023 due to trips/falls at home becoming more frequent. She has chronic low back pain and sciatica for which she is followed by orthopedic surgery. She is not currently a surgical candidate. She's also followed by the lymphedema clinic, takes lasix as recommended, also no HCTZ for HTN. In the ED, after having pain medications given, she was somnolent, difficult to rouse. VBG showed compensated respiratory acidosis. Work up further included CT head which was nonacute and CT abd/pelvis which showed emphysematous cystitis. Antibiotics started and PT evaluation sought. Will require SNF rehabilitation for safe disposition.   The patient is stable to discharge to SNF  Emphysematous cystitis:  - Convert ceftriaxone > keflex pending urine culture data.  - Urology, Dr. Marlou Porch, consulted by EDP. No changes to management recommended.   Acute metabolic encephalopathy: Improved while holding sedating medications.  - Minimize sedating medications, at risk for CO2 retention/hypoventilation. Holding belsomra, flexeril, lyrica for now.   OSA:  - CPAP qHS, suspect OHS as well.     Degenerative lumbar arthropathy: s/p instrumentation. Chronic pain on chronic opioid Tx confirmed by PDMP review - Continue long term opioid as per PDMP review - norco ordered. - Follow up with Dr. Christell Constant or second opinion orthopedic surgery after discharge. No LE weakness.   Morbid obesity: Body mass index is 75.44 kg/m. This has been said to make her not a surgical candidate - Suggest PCP to follow up, consider GLP1 agonist.    Frequent falls: CT head without acute findings. Nonfocal neurological exam. Suspect growing deconditioning is contributing with acute infection precipitating worsening now.  - PT evaluation > seek SNF rehabilitation.    Hypokalemia:  - Repeat supplementation   Poor IV access: No need for IV access as she's taking po and stable for discharge. Had femoral CVC placed on admission, removed 7/16.   Lymphedema:  - Will plan to restart lasix once AKI completely resolved (likely 7/16) and DC HCTZ to avoid hypokalemia.   AKI:  - Improving, Cr 2 > 1.65 > 1.32 which is baseline. DC IVF. Restart lasix 7/17.    HTN: BP not elevated, continue holding HCTZ, lasix today   Depression, anxiety:  - Restarted cymbalta, bupropion   Hypothyroidism:  - TSH pending - Continue synthroid   GERD:  - PPI   Asthma: Quiescent, no wheezing.  - Continue current Tx.    Gout: Quiescent - Continue allopurinol Procedures performed: PT   Disposition: Skilled nursing facility Diet recommendation:  Discharge Diet Orders (From admission, onward)     Start     Ordered   04/03/23 0000  Diet - low sodium heart healthy  04/03/23 1025           Cardiac diet DISCHARGE MEDICATION: Allergies as of 04/03/2023       Reactions   Lisinopril Anaphylaxis   Angioedema   Bee Venom Swelling, Other (See Comments)   Swelling at the site   Bupropion Swelling   Latex Itching, Rash   Propoxyphene Hives   Methadone Other (See Comments)   unknown   Codeine Nausea Only   Meloxicam  Other (See Comments)   Insomnia, constipation   Morphine And Codeine Rash   Tegaderm Ag Mesh [silver] Hives, Itching, Rash   Tomato Rash        Medication List     STOP taking these medications    Belsomra 15 MG Tabs Generic drug: Suvorexant   hydrochlorothiazide 25 MG tablet Commonly known as: HYDRODIURIL       TAKE these medications    albuterol 108 (90 Base) MCG/ACT inhaler Commonly known as: VENTOLIN HFA Inhale 1-2 puffs into the lungs every 6 (six) hours as needed for wheezing or shortness of breath.   allopurinol 100 MG tablet Commonly known as: ZYLOPRIM Take 1 tablet (100 mg total) by mouth 2 (two) times daily.   ascorbic Acid 500 MG Cpcr Commonly known as: VITAMIN C Take 500 mg by mouth daily. Gummy   budesonide 0.5 MG/2ML nebulizer solution Commonly known as: PULMICORT Take by nebulization.   buPROPion 150 MG 24 hr tablet Commonly known as: WELLBUTRIN XL Take 150 mg by mouth 2 (two) times daily. Take with 300 mg to =450 mg What changed: Another medication with the same name was removed. Continue taking this medication, and follow the directions you see here.   cephALEXin 500 MG capsule Commonly known as: KEFLEX Take 1 capsule (500 mg total) by mouth every 8 (eight) hours for 5 days.   cyclobenzaprine 10 MG tablet Commonly known as: FLEXERIL Take 1 tablet by mouth 3 (three) times daily as needed.   cycloSPORINE 0.05 % ophthalmic emulsion Commonly known as: RESTASIS Place 1 drop into both eyes 2 (two) times daily as needed (dry eyes).   DULoxetine 60 MG capsule Commonly known as: CYMBALTA Take 60 mg by mouth 2 (two) times daily.   Ferrous Gluconate 324 (37.5 Fe) MG Tabs Take 324 mg by mouth daily.   furosemide 20 MG tablet Commonly known as: LASIX Take 20 mg by mouth daily.   HYDROcodone-acetaminophen 5-325 MG tablet Commonly known as: NORCO/VICODIN Take 1 tablet by mouth every 8 (eight) hours as needed.   ipratropium-albuterol 0.5-2.5  (3) MG/3ML Soln Commonly known as: DUONEB Inhale into the lungs.   levothyroxine 150 MCG tablet Commonly known as: SYNTHROID Take 150 mcg by mouth daily before breakfast.   naloxone 4 MG/0.1ML Liqd nasal spray kit Commonly known as: NARCAN Place into the nose.   nystatin powder Commonly known as: MYCOSTATIN/NYSTOP Apply 1 Application topically 3 (three) times daily.   omeprazole 40 MG capsule Commonly known as: PRILOSEC Take 40 mg by mouth 2 (two) times daily.   oxybutynin 15 MG 24 hr tablet Commonly known as: DITROPAN XL Take 15 mg by mouth daily.   pregabalin 75 MG capsule Commonly known as: LYRICA Take 1 capsule (75 mg total) by mouth 2 (two) times daily.   vitamin B-12 250 MCG tablet Commonly known as: CYANOCOBALAMIN Take 250 mcg by mouth daily.        Contact information for after-discharge care     Destination     [facility] Preferred SNF .   Service: Skilled Nursing Contact information: [address] [place] [address] [phone]                    Discharge Exam: Ceasar Mons Weights   03/31/23 1452 03/31/23 2219  Weight: (!) 167.8 kg (!) 181.1 kg        General:  AAO x 3,  cooperative, no distress; morbidly obese  HEENT:  Normocephalic, PERRL, otherwise with in Normal limits   Neuro:  CNII-XII intact. , normal motor and sensation, reflexes intact   Lungs:   Clear to auscultation BL, Respirations unlabored,  No wheezes / crackles  Cardio:    S1/S2, RRR, No murmure, No Rubs or Gallops   Abdomen:  Soft, non-tender, bowel sounds active all four quadrants, no guarding or peritoneal signs.  Muscular  skeletal:  Limited exam -global generalized weaknesses - in bed, able to move all 4 extremities,   2+ pulses,  symmetric, No pitting edema  Skin:  Dry, warm to touch, negative for any Rashes,  Wounds: Please see nursing documentation          Condition at discharge:  fair  The results of significant diagnostics from this hospitalization (including imaging, microbiology, ancillary and laboratory) are listed below for reference.   Imaging Studies: DG Foot Complete Left  Result Date: 03/31/2023 CLINICAL DATA:  Trauma, fall EXAM: LEFT FOOT - COMPLETE 3+ VIEW COMPARISON:  None Available. FINDINGS: No recent fracture or dislocation is seen. Bony spurs are seen in the first metatarsophalangeal joint and interphalangeal joint of big toe. IMPRESSION: No recent fracture or dislocation is seen left foot. Electronically Signed   By: Ernie Avena M.D.   On: 03/31/2023 16:55   DG Knee Complete 4 Views Right  Result Date: 03/31/2023 CLINICAL DATA:  Trauma, fall, pain EXAM: RIGHT KNEE - COMPLETE 4+ VIEW COMPARISON:  None FINDINGS: Previous right knee arthroplasty. No recent fracture or dislocation is seen. Lateral view is technically less than optimal. Smoothly marginated calcifications adjacent to the distal femur may be residual from previous trauma and previous surgery. IMPRESSION: Previous right knee arthroplasty. No recent fracture or dislocation is seen. Electronically Signed   By: Ernie Avena M.D.   On: 03/31/2023 16:53   DG Knee Complete 4 Views Left  Result Date: 03/31/2023 CLINICAL DATA:  Trauma, fall EXAM: LEFT KNEE - COMPLETE 4+ VIEW COMPARISON:  02/06/2021 FINDINGS: Lateral view is less than optimal. As far as seen, no recent fracture or dislocation is seen. There is previous left knee arthroplasty. Smooth marginated calcification medial to the proximal tibia has not changed. IMPRESSION: No displaced fracture or dislocation is seen. Previous left knee arthroplasty. Lateral view is less than optimal limiting evaluation. Electronically Signed   By: Ernie Avena M.D.   On: 03/31/2023 16:51   CT Cervical Spine Wo Contrast  Result Date: 03/31/2023 CLINICAL DATA:  Trauma, fall EXAM: CT CERVICAL SPINE WITHOUT CONTRAST TECHNIQUE: Multidetector CT  imaging of the cervical spine was performed without intravenous contrast. Multiplanar CT image reconstructions were also generated. RADIATION DOSE REDUCTION: This exam was performed according to the departmental dose-optimization program which includes automated exposure control, adjustment of the mA and/or kV according to patient size and/or use of iterative reconstruction technique. COMPARISON:  None Available. FINDINGS: Examination was technically somewhat limited due to patient's body habitus. Alignment: Alignment of posterior margins of vertebral bodies is within normal limits. Skull base and vertebrae: No recent fracture is seen. Soft tissues and  spinal canal: There is no central spinal stenosis. Disc levels: There is mild encroachment of neural foramina from C3-C7 levels. Upper chest: Upper lung fields are unremarkable. Other: None. IMPRESSION: No recent fracture is seen cervical spine. Cervical spondylosis with mild encroachment of neural foramina at multiple levels. Electronically Signed   By: Ernie Avena M.D.   On: 03/31/2023 16:50   CT CHEST ABDOMEN PELVIS WO CONTRAST  Result Date: 03/31/2023 CLINICAL DATA:  Multiple falls.  Polytrauma.  Unable to bear weight. EXAM: CT CHEST, ABDOMEN AND PELVIS WITHOUT CONTRAST TECHNIQUE: Multidetector CT imaging of the chest, abdomen and pelvis was performed following the standard protocol without IV contrast. RADIATION DOSE REDUCTION: This exam was performed according to the departmental dose-optimization program which includes automated exposure control, adjustment of the mA and/or kV according to patient size and/or use of iterative reconstruction technique. COMPARISON:  Multiple exams, including 11/19/2019 and 12/19/2021 FINDINGS: Body habitus reduces diagnostic sensitivity and specificity. CT CHEST FINDINGS Cardiovascular: Atheromatous vascular calcification of the thoracic aorta. Mediastinum/Nodes: Unremarkable Lungs/Pleura: Mild scarring in the left  lower lobe. Musculoskeletal: Left total shoulder prosthesis. Dorsal column stimulator with lead tips running from T7 through T11. Mid and lower thoracic spondylosis. CT ABDOMEN PELVIS FINDINGS Hepatobiliary: Diffuse hepatic steatosis.  Cholecystectomy. Pancreas: Unremarkable Spleen: Unremarkable Adrenals/Urinary Tract: The kidneys and adrenal glands appear normal. There is gas in the wall of the urinary bladder compatible with emphysematous cystitis. Trace amount of gas just outside of bladder wall and medial to the pectineus muscle on images 104 through 105 of series 2. Stomach/Bowel: Unremarkable Vascular/Lymphatic: Mild aortoiliac atherosclerotic vascular calcification. Reproductive: Unremarkable Other: No supplemental non-categorized findings. Musculoskeletal: Left total hip prosthesis. Posterolateral rod and pedicle screw fixation at L4-L5-S1, with interbody solid-appearing fusion at L4-5 and L5-S1. Suspected bilateral foraminal impingement at L3-4 and possibly at L2-3 due to spurring. IMPRESSION: 1. Scattered small amounts of gas in the urinary bladder wall compatible with emphysematous cystitis. Miniscule amount of gas tracking just outside the bladder wall in the right lower quadrant medial to the pectineus muscle, significance uncertain but no abscess detected. 2. Diffuse hepatic steatosis. 3. Dorsal column stimulator. 4. Suspected bilateral foraminal impingement at L3-4 and possibly at L2-3 due to spurring. Aortic Atherosclerosis (ICD10-I70.0) Electronically Signed   By: Gaylyn Rong M.D.   On: 03/31/2023 16:48   CT Head Wo Contrast  Result Date: 03/31/2023 CLINICAL DATA:  Trauma, fall EXAM: CT HEAD WITHOUT CONTRAST TECHNIQUE: Contiguous axial images were obtained from the base of the skull through the vertex without intravenous contrast. RADIATION DOSE REDUCTION: This exam was performed according to the departmental dose-optimization program which includes automated exposure control, adjustment  of the mA and/or kV according to patient size and/or use of iterative reconstruction technique. COMPARISON:  08/30/2010 FINDINGS: Brain: No acute findings are seen within the cranium. There are no signs of bleeding within the cranium. Ventricles are not dilated. Vascular: Unremarkable. Skull: No fracture is seen in cul de sac. Hyperostosis frontalis interna is seen. Sinuses/Orbits: Unremarkable. Other: None. IMPRESSION: No acute intracranial findings are seen in noncontrast CT brain. Electronically Signed   By: Ernie Avena M.D.   On: 03/31/2023 16:39    Microbiology: Results for orders placed or performed during the hospital encounter of 03/31/23  Urine Culture (for pregnant, neutropenic or urologic patients or patients with an indwelling urinary catheter)     Status: Abnormal   Collection Time: 03/31/23  6:20 PM   Specimen: Urine, Clean Catch  Result Value Ref Range Status  Specimen Description   Final    URINE, CLEAN CATCH Performed at [facility], [address], [place], [place] [address]    Special Requests   Final    NONE Performed at [facility], [address] [place], [place] [address]    Culture >=100,000 COLONIES/mL KLEBSIELLA PNEUMONIAE (A)  Final   Report Status 04/03/2023 FINAL  Final   Organism ID, Bacteria KLEBSIELLA PNEUMONIAE (A)  Final      Susceptibility   Klebsiella pneumoniae - MIC*    AMPICILLIN >=32 RESISTANT Resistant     CEFAZOLIN <=4 SENSITIVE Sensitive     CEFEPIME <=0.12 SENSITIVE Sensitive     CEFTRIAXONE <=0.25 SENSITIVE Sensitive     CIPROFLOXACIN <=0.25 SENSITIVE Sensitive     GENTAMICIN <=1 SENSITIVE Sensitive     IMIPENEM <=0.25 SENSITIVE Sensitive     NITROFURANTOIN <=16 SENSITIVE Sensitive     TRIMETH/SULFA <=20 SENSITIVE Sensitive     AMPICILLIN/SULBACTAM 16 INTERMEDIATE Intermediate     PIP/TAZO 16 SENSITIVE Sensitive     * >=100,000 COLONIES/mL KLEBSIELLA PNEUMONIAE    Labs: CBC: Recent Labs  Lab 03/31/23 1526 04/01/23 0455   WBC 6.2 4.9  NEUTROABS 3.5  --   HGB 10.5* 9.5*  HCT 33.5* 30.0*  MCV 90.3 90.1  PLT 226 216   Basic Metabolic Panel: Recent Labs  Lab 03/31/23 1526 03/31/23 1845 04/01/23 0455 04/02/23 0406  NA 134*  --  136 139  K 2.9*  --  2.8* 3.1*  CL 96*  --  98 102  CO2 24  --  28 27  GLUCOSE 116*  --  131* 116*  BUN 28*  --  22* 17  CREATININE 2.03*  --  1.65* 1.32*  CALCIUM 9.3  --  8.7* 8.9  MG  --  1.8  --   --    Liver Function Tests: Recent Labs  Lab 03/31/23 1526  AST 46*  ALT 32  ALKPHOS 73  BILITOT 1.0  PROT 7.4  ALBUMIN 3.8   CBG: No results for input(s): "GLUCAP" in the last 168 hours.  Discharge time spent: greater than 30 minutes.  Signed: Kendell Bane, MD [facility] 04/03/2023

## 2023-04-03 NOTE — TOC Transition Note (Signed)
Transition of Care [facility]) - CM/SW Discharge Note   Patient Details  Name: Marilyn [name] MRN: [id number] Date of Birth: [birth year]  Transition of Care [facility]) CM/SW Contact:  Leitha Bleak, RN Phone Number: 04/03/2023, 12:30 PM   Clinical Narrative:   Patient doing well with PT today. Patient agreeable to go home with HHPT. Clifton Custard with [facility] Accepted. MD aware to order. TOC cancelling [facility] bed and AUTH.    Final next level of care: Home w Home Health Services Barriers to Discharge: Barriers Resolved   Patient Goals and CMS Choice CMS Medicare.gov Compare Post Acute Care list provided to:: Patient Choice offered to / list presented to : Patient  Discharge Placement    Patient and family notified of of transfer: 04/03/23  Discharge Plan and Services Additional resources added to the After Visit Summary for   In-house Referral: Clinical Social Work   Post Acute Care Choice: Skilled Nursing Facility               HH Arranged: PT [facility] Agency: [facility] Date [facility] Agency Contacted: 04/03/23 Time HH Agency Contacted: 1230 Representative spoke with at [facility] Agency: Clifton Custard  Social Determinants of Health (SDOH) Interventions SDOH Screenings   Food Insecurity: No Food Insecurity (03/31/2023)  Recent Concern: Food Insecurity - Food Insecurity Present (03/24/2023)   Received from [facility], [facility]  Housing: Low Risk  (03/31/2023)  Transportation Needs: No Transportation Needs (03/31/2023)  Recent Concern: Transportation Needs - Unmet Transportation Needs (01/18/2023)   Received from [facility], [facility]  Utilities: Not At Risk (03/31/2023)  Depression (PHQ2-9): Low Risk  (09/18/2018)  Financial Resource Strain: High Risk (03/24/2023)   Received from [facility], [facility]  Physical Activity: Insufficiently Active (03/24/2023)   Received from [facility], [facility]  Social Connections: Socially Isolated (03/24/2023)   Received from [facility], [facility]  Stress: Stress Concern Present (03/24/2023)   Received from [facility], [facility]  Tobacco Use: Medium Risk (03/31/2023)    Readmission Risk Interventions     No data to display

## 2023-04-03 NOTE — Progress Notes (Signed)
Physical Therapy Treatment Patient Details Name: Marilyn [name] MRN: [id number] DOB: 1970/12/03 Today's Date: 04/03/2023   History of Present Illness Marilyn [name] is a 52 y.o. female with medical history significant for morbid obesity, OSA on CPAP, hypertension, asthma.  Patient presented to the ED with complaints of falls over the past 3 to 4 weeks.  She reports a total of 4 falls.  She denies dizziness or passing out.  She did not hit her head.  All the falls are related to mechanical issue, tripping, and the balance when bending over and subsequently falling.  She reports good oral intake, no vomiting no loose stools.  She has been compliant with Lasix 20 mg twice daily and HCTZ.  She reports swelling to her bilateral lower extremities over the past 3 to 4 weeks, making it difficult for her to walk.  She denies pain with urination, denied frequency of abdominal pain.  But complains of right flank pain.  No difficulty breathing.  No chest pain.  No fevers no chills.  She reports increased sleepiness over the past few days.    PT Comments  Pt tolerated transfers and ambulation in room/hallway with use of RW. Activity limited due to fatigue and LE weakness. Pt tolerated sitting up in chair after therapy. Patient will benefit from continued skilled physical therapy in hospital and recommended venue below to increase strength, balance, endurance for safe ADLs and gait.      Assistance Recommended at Discharge Set up Supervision/Assistance  If plan is discharge home, recommend the following:  Can travel by private vehicle    Help with stairs or ramp for entrance;Assistance with cooking/housework;A little help with walking and/or transfers;A little help with bathing/dressing/bathroom   No  Equipment Recommendations  Rolling walker (2 wheels)    Recommendations for Other Services       Precautions / Restrictions Precautions Precautions: Fall Restrictions Weight Bearing Restrictions:  No     Mobility  Bed Mobility Overal bed mobility: Modified Independent Bed Mobility: Supine to Sit     Supine to sit: Min assist     General bed mobility comments: increased time, labored movement    Transfers Overall transfer level: Modified independent Equipment used: Rolling walker (2 wheels) Transfers: Sit to/from Stand, Bed to chair/wheelchair/BSC Sit to Stand: Min assist   Step pivot transfers: Min assist       General transfer comment: increased time, labored movement, RW    Ambulation/Gait Ambulation/Gait assistance: Editor, commissioning (Feet): 50 Feet Assistive device: Rolling walker (2 wheels) Gait Pattern/deviations: Decreased step length - right, Decreased step length - left, Decreased stride length Gait velocity: slow     General Gait Details: limited ambulation distance due to fatigue and generalized weakness   Stairs             Wheelchair Mobility     Tilt Bed    Modified Rankin (Stroke Patients Only)       Balance Overall balance assessment: Modified Independent Sitting-balance support: Feet supported, No upper extremity supported Sitting balance-Leahy Scale: Good Sitting balance - Comments: good seated at EOB   Standing balance support: During functional activity, No upper extremity supported, Reliant on assistive device for balance Standing balance-Leahy Scale: Fair Standing balance comment: fait/good sometimes using RW to balance at bedside                            Cognition Arousal/Alertness: Awake/alert Behavior During Therapy: [facility]  for tasks assessed/performed Overall Cognitive Status: Within Functional Limits for tasks assessed                                          Exercises General Exercises - Lower Extremity Ankle Circles/Pumps: AROM, Right, Left, 10 reps, Seated Long Arc Quad: AROM, Seated, Left, Right, 10 reps Toe Raises: AROM, Seated, Right, Left, 10 reps Heel Raises:  AROM, Seated, Right, Left, 10 reps    General Comments        Pertinent Vitals/Pain Pain Assessment Pain Assessment: Faces Pain Score: 0-No pain Faces Pain Scale: Hurts a little bit Pain Location: LB Pain Descriptors / Indicators: Sore, Grimacing    Home Living                          Prior Function            PT Goals (current goals can now be found in the care plan section) Acute Rehab PT Goals Patient Stated Goal: return home with family to assist PT Goal Formulation: With patient Time For Goal Achievement: 04/15/23 Potential to Achieve Goals: Good Progress towards PT goals: Progressing toward goals    Frequency    Min 3X/week      PT Plan Current plan remains appropriate    Co-evaluation              AM-PAC PT "6 Clicks" Mobility   Outcome Measure  Help needed turning from your back to your side while in a flat bed without using bedrails?: A Little Help needed moving from lying on your back to sitting on the side of a flat bed without using bedrails?: A Little Help needed moving to and from a bed to a chair (including a wheelchair)?: A Little Help needed standing up from a chair using your arms (e.g., wheelchair or bedside chair)?: A Little Help needed to walk in hospital room?: A Little Help needed climbing 3-5 steps with a railing? : A Lot 6 Click Score: 17    End of Session   Activity Tolerance: Patient tolerated treatment well;Patient limited by fatigue Patient left: in chair;with call bell/phone within reach Nurse Communication: Mobility status PT Visit Diagnosis: Unsteadiness on feet (R26.81);Other abnormalities of gait and mobility (R26.89);Muscle weakness (generalized) (M62.81)     Time: 1030-1056 PT Time Calculation (min) (ACUTE ONLY): 26 min  Charges:    $Gait Training: 8-22 mins $Therapeutic Exercise: 8-22 mins PT General Charges $$ ACUTE PT VISIT: 1 Visit                     Daryle Amis SPT [place], DPT Program

## 2023-04-03 NOTE — Plan of Care (Signed)
  Problem: Education: Goal: Knowledge of General Education information will improve Description: Including pain rating scale, medication(s)/side effects and non-pharmacologic comfort measures 04/03/2023 1230 by Sheela Stack, RN Outcome: Adequate for Discharge 04/03/2023 1057 by Sheela Stack, RN Outcome: Progressing   Problem: Health Behavior/Discharge Planning: Goal: Ability to manage health-related needs will improve 04/03/2023 1230 by Sheela Stack, RN Outcome: Adequate for Discharge 04/03/2023 1057 by Sheela Stack, RN Outcome: Progressing   Problem: Clinical Measurements: Goal: Ability to maintain clinical measurements within normal limits will improve 04/03/2023 1230 by Sheela Stack, RN Outcome: Adequate for Discharge 04/03/2023 1057 by Sheela Stack, RN Outcome: Progressing Goal: Will remain free from infection 04/03/2023 1230 by Sheela Stack, RN Outcome: Adequate for Discharge 04/03/2023 1057 by Sheela Stack, RN Outcome: Progressing Goal: Diagnostic test results will improve 04/03/2023 1230 by Sheela Stack, RN Outcome: Adequate for Discharge 04/03/2023 1057 by Sheela Stack, RN Outcome: Progressing Goal: Respiratory complications will improve 04/03/2023 1230 by Sheela Stack, RN Outcome: Adequate for Discharge 04/03/2023 1057 by Sheela Stack, RN Outcome: Progressing Goal: Cardiovascular complication will be avoided 04/03/2023 1230 by Sheela Stack, RN Outcome: Adequate for Discharge 04/03/2023 1057 by Sheela Stack, RN Outcome: Progressing   Problem: Activity: Goal: Risk for activity intolerance will decrease 04/03/2023 1230 by Sheela Stack, RN Outcome: Adequate for Discharge 04/03/2023 1057 by Sheela Stack, RN Outcome: Progressing   Problem: Nutrition: Goal: Adequate nutrition will be maintained 04/03/2023 1230 by Sheela Stack, RN Outcome: Adequate for Discharge 04/03/2023 1057 by Sheela Stack, RN Outcome: Progressing    Problem: Coping: Goal: Level of anxiety will decrease 04/03/2023 1230 by Sheela Stack, RN Outcome: Adequate for Discharge 04/03/2023 1057 by Sheela Stack, RN Outcome: Progressing   Problem: Elimination: Goal: Will not experience complications related to bowel motility 04/03/2023 1230 by Sheela Stack, RN Outcome: Adequate for Discharge 04/03/2023 1057 by Sheela Stack, RN Outcome: Progressing Goal: Will not experience complications related to urinary retention 04/03/2023 1230 by Sheela Stack, RN Outcome: Adequate for Discharge 04/03/2023 1057 by Sheela Stack, RN Outcome: Progressing   Problem: Pain Managment: Goal: General experience of comfort will improve 04/03/2023 1230 by Sheela Stack, RN Outcome: Adequate for Discharge 04/03/2023 1057 by Sheela Stack, RN Outcome: Progressing   Problem: Safety: Goal: Ability to remain free from injury will improve 04/03/2023 1230 by Sheela Stack, RN Outcome: Adequate for Discharge 04/03/2023 1057 by Sheela Stack, RN Outcome: Progressing   Problem: Skin Integrity: Goal: Risk for impaired skin integrity will decrease 04/03/2023 1230 by Sheela Stack, RN Outcome: Adequate for Discharge 04/03/2023 1057 by Sheela Stack, RN Outcome: Progressing

## 2023-04-07 ENCOUNTER — Encounter (HOSPITAL_COMMUNITY): Payer: Self-pay

## 2023-04-07 ENCOUNTER — Emergency Department (HOSPITAL_COMMUNITY): Payer: 59

## 2023-04-07 ENCOUNTER — Other Ambulatory Visit: Payer: Self-pay

## 2023-04-07 ENCOUNTER — Emergency Department (HOSPITAL_COMMUNITY)
Admission: EM | Admit: 2023-04-07 | Discharge: 2023-04-08 | Disposition: A | Discharge status: Home / Self Care | Payer: 59 | Attending: Emergency Medicine | Admitting: Emergency Medicine

## 2023-04-07 DIAGNOSIS — N390 Urinary tract infection, site not specified: Secondary | ICD-10-CM | POA: Diagnosis not present

## 2023-04-07 DIAGNOSIS — R1032 Left lower quadrant pain: Secondary | ICD-10-CM

## 2023-04-07 DIAGNOSIS — Z91040 Latex allergy status: Secondary | ICD-10-CM | POA: Diagnosis not present

## 2023-04-07 DIAGNOSIS — I10 Essential (primary) hypertension: Secondary | ICD-10-CM | POA: Diagnosis not present

## 2023-04-07 DIAGNOSIS — N289 Disorder of kidney and ureter, unspecified: Secondary | ICD-10-CM | POA: Diagnosis not present

## 2023-04-07 DIAGNOSIS — D649 Anemia, unspecified: Secondary | ICD-10-CM | POA: Insufficient documentation

## 2023-04-07 DIAGNOSIS — J45909 Unspecified asthma, uncomplicated: Secondary | ICD-10-CM | POA: Diagnosis not present

## 2023-04-07 LAB — COMPREHENSIVE METABOLIC PANEL
ALT: 51 U/L — ABNORMAL HIGH (ref 0–44)
AST: 49 U/L — ABNORMAL HIGH (ref 15–41)
Albumin: 3.9 g/dL (ref 3.5–5.0)
Alkaline Phosphatase: 73 U/L (ref 38–126)
Anion gap: 10 (ref 5–15)
BUN: 11 mg/dL (ref 6–20)
CO2: 22 mmol/L (ref 22–32)
Calcium: 9.4 mg/dL (ref 8.9–10.3)
Chloride: 104 mmol/L (ref 98–111)
Creatinine, Ser: 1.17 mg/dL — ABNORMAL HIGH (ref 0.44–1.00)
GFR, Estimated: 55 mL/min — ABNORMAL LOW (ref >60)
Glucose, Bld: 90 mg/dL (ref 70–99)
Potassium: 3.7 mmol/L (ref 3.5–5.1)
Sodium: 136 mmol/L (ref 135–145)
Total Bilirubin: 1.3 mg/dL — ABNORMAL HIGH (ref 0.3–1.2)
Total Protein: 7.6 g/dL (ref 6.5–8.1)

## 2023-04-07 LAB — URINALYSIS, ROUTINE W REFLEX MICROSCOPIC
Glucose, UA: NEGATIVE mg/dL
Hgb urine dipstick: NEGATIVE
Ketones, ur: 5 mg/dL — AB
Nitrite: NEGATIVE
Protein, ur: 100 mg/dL — AB
Specific Gravity, Urine: 1.027 (ref 1.005–1.030)
pH: 6 (ref 5.0–8.0)

## 2023-04-07 LAB — CBC
HCT: 34.1 % — ABNORMAL LOW (ref 36.0–46.0)
Hemoglobin: 10.8 g/dL — ABNORMAL LOW (ref 12.0–15.0)
MCH: 28.9 pg (ref 26.0–34.0)
MCHC: 31.7 g/dL (ref 30.0–36.0)
MCV: 91.2 fL (ref 80.0–100.0)
Platelets: 203 10*3/uL (ref 150–400)
RBC: 3.74 MIL/uL — ABNORMAL LOW (ref 3.87–5.11)
RDW: 15.8 % — ABNORMAL HIGH (ref 11.5–15.5)
WBC: 5.6 10*3/uL (ref 4.0–10.5)
nRBC: 0 % (ref 0.0–0.2)

## 2023-04-07 LAB — LIPASE, BLOOD: Lipase: 23 U/L (ref 11–51)

## 2023-04-07 MED ORDER — ONDANSETRON HCL 4 MG/2ML IJ SOLN
4.0000 mg | Freq: Once | INTRAMUSCULAR | Status: AC
  Administered 2023-04-07: 4 mg via INTRAVENOUS
  Filled 2023-04-07: qty 2

## 2023-04-07 MED ORDER — HYDROMORPHONE HCL 1 MG/ML IJ SOLN
1.0000 mg | Freq: Once | INTRAMUSCULAR | Status: AC
  Administered 2023-04-07: 1 mg via INTRAVENOUS
  Filled 2023-04-07: qty 1

## 2023-04-07 MED ORDER — IOHEXOL 300 MG/ML  SOLN
100.0000 mL | Freq: Once | INTRAMUSCULAR | Status: AC | PRN
  Administered 2023-04-07: 100 mL via INTRAVENOUS

## 2023-04-07 NOTE — ED Provider Notes (Signed)
[place] EMERGENCY DEPARTMENT AT [facility] Provider Note   CSN: [id number] Arrival date & time: 04/07/23  2005     History {Add pertinent medical, surgical, social history, OB history to HPI:1} Chief Complaint  Patient presents with   Abdominal Pain    Marilyn [name] is a 52 y.o. female.  The history is provided by the patient.  Abdominal Pain She has history of hypertension, asthma, morbid obesity, obstructive sleep apnea on CPAP and comes in complaining of left lower quadrant abdominal pain which started tonight.  There is no radiation of pain.  She denies nausea or vomiting.  She denies any urinary difficulty.  She has chronic loose stools but stools are no different from her baseline.  She has not taken anything for pain.  She has not had similar pain in the past.   Home Medications Prior to Admission medications   Medication Sig Start Date End Date Taking? Authorizing Provider  albuterol (VENTOLIN HFA) 108 (90 Base) MCG/ACT inhaler Inhale 1-2 puffs into the lungs every 6 (six) hours as needed for wheezing or shortness of breath. 12/09/19   Henderly, Britni A, PA-C  allopurinol (ZYLOPRIM) 100 MG tablet Take 1 tablet (100 mg total) by mouth 2 (two) times daily. 02/13/22 03/31/23  Kerrin Champagne, MD  ascorbic Acid (VITAMIN C) 500 MG CPCR Take 500 mg by mouth daily. Gummy    [provider]  budesonide (PULMICORT) 0.5 MG/2ML nebulizer solution Take by nebulization. 01/15/23   [provider]  buPROPion (WELLBUTRIN XL) 150 MG 24 hr tablet Take 150 mg by mouth 2 (two) times daily. Take with 300 mg to =450 mg 06/21/22 07/16/23  [provider]  cephALEXin (KEFLEX) 500 MG capsule Take 1 capsule (500 mg total) by mouth every 8 (eight) hours for 5 days. 04/03/23 04/08/23  Kendell Bane, MD  cyclobenzaprine (FLEXERIL) 10 MG tablet Take 1 tablet by mouth 3 (three) times daily as needed. 01/28/23   [provider]  cycloSPORINE (RESTASIS) 0.05  % ophthalmic emulsion Place 1 drop into both eyes 2 (two) times daily as needed (dry eyes).    [provider]  DULoxetine (CYMBALTA) 60 MG capsule Take 60 mg by mouth 2 (two) times daily. 05/02/21   [provider]  Ferrous Gluconate 324 (37.5 Fe) MG TABS Take 324 mg by mouth daily. 02/14/20   [provider]  furosemide (LASIX) 20 MG tablet Take 20 mg by mouth daily.    [provider]  HYDROcodone-acetaminophen (NORCO/VICODIN) 5-325 MG tablet Take 1 tablet by mouth every 8 (eight) hours as needed. 01/20/23   [provider]  ipratropium-albuterol (DUONEB) 0.5-2.5 (3) MG/3ML SOLN Inhale into the lungs. 02/15/23   [provider]  levothyroxine (SYNTHROID) 150 MCG tablet Take 150 mcg by mouth daily before breakfast. 05/18/19   [provider]  naloxone (NARCAN) nasal spray 4 mg/0.1 mL Place into the nose. 08/21/22   [provider]  nystatin (MYCOSTATIN/NYSTOP) powder Apply 1 Application topically 3 (three) times daily. 11/06/22   [provider]  omeprazole (PRILOSEC) 40 MG capsule Take 40 mg by mouth 2 (two) times daily.    [provider]  oxybutynin (DITROPAN XL) 15 MG 24 hr tablet Take 15 mg by mouth daily.  05/31/17   [provider]  pregabalin (LYRICA) 75 MG capsule Take 1 capsule (75 mg total) by mouth 2 (two) times daily. 08/07/22   London Sheer, MD  vitamin B-12 (CYANOCOBALAMIN) 250 MCG tablet  Take 250 mcg by mouth daily.    [provider]      Allergies    Lisinopril, Bee venom, Bupropion, Latex, Propoxyphene, Methadone, Codeine, Meloxicam, Morphine and codeine, Tegaderm ag mesh [silver], and Tomato    Review of Systems   Review of Systems  Gastrointestinal:  Positive for abdominal pain.  All other systems reviewed and are negative.   Physical Exam Updated Vital Signs BP (!) 153/78 (BP Location: Left Wrist)   Pulse 74   Temp 97.8 F (36.6 C)   Resp 18   Ht 5\' 3"  (1.6 m)    Wt (!) 178.6 kg   LMP  (LMP Unknown) Comment: tubal ligation  SpO2 99%   BMI 69.76 kg/m  Physical Exam Vitals and nursing note reviewed.   52 year old female, resting comfortably and in no acute distress. Vital signs are significant for elevated blood pressure. Oxygen saturation is 99%, which is normal. Head is normocephalic and atraumatic. PERRLA, EOMI. Oropharynx is clear. Neck is nontender and supple without adenopathy or JVD. Back is nontender and there is no CVA tenderness. Lungs are clear without rales, wheezes, or rhonchi. Chest is nontender. Heart has regular rate and rhythm without murmur. Abdomen is soft, flat, with fairly well localized left lower quadrant tenderness.  There is no rebound or guarding. Extremities have 2+ edema, full range of motion is present. Skin is warm and dry without rash. Neurologic: Mental status is normal, cranial nerves are intact, moves all extremities equally.  ED Results / Procedures / Treatments   Labs (all labs ordered are listed, but only abnormal results are displayed) Labs Reviewed  COMPREHENSIVE METABOLIC PANEL - Abnormal; Notable for the following components:      Result Value   Creatinine, Ser 1.17 (*)    AST 49 (*)    ALT 51 (*)    Total Bilirubin 1.3 (*)    GFR, Estimated 55 (*)    All other components within normal limits  CBC - Abnormal; Notable for the following components:   RBC 3.74 (*)    Hemoglobin 10.8 (*)    HCT 34.1 (*)    RDW 15.8 (*)    All other components within normal limits  URINALYSIS, ROUTINE W REFLEX MICROSCOPIC - Abnormal; Notable for the following components:   Color, Urine AMBER (*)    APPearance HAZY (*)    Bilirubin Urine SMALL (*)    Ketones, ur 5 (*)    Protein, ur 100 (*)    Leukocytes,Ua MODERATE (*)    Bacteria, UA FEW (*)    All other components within normal limits  LIPASE, BLOOD    EKG None  Radiology No results found.  Procedures Procedures  {Document cardiac monitor,  telemetry assessment procedure when appropriate:1}  Medications Ordered in ED Medications - No data to display  ED Course/ Medical Decision Making/ A&P   {   Click here for ABCD2, HEART and other calculatorsREFRESH Note before signing :1}                          Medical Decision Making Amount and/or Complexity of Data Reviewed Labs: ordered.   Left lower quadrant pain of uncertain cause.  Differential diagnosis is broad and includes, but is not limited to, urinary tract infection, diverticulitis, urolithiasis, ovarian cyst.  This differential includes conditions with significant risk of morbidity and complications.  I have reviewed her laboratory work ordered at triage, my interpretation is mild elevation  of transaminases of uncertain clinical significance, mildly elevated total bilirubin also of uncertain clinical significance, stable anemia, normal WBC, urinalysis with 21-50 WBCs but few bacteria.  This is concerning for possible urinary tract infection.  I have ordered a urine culture.  I have ordered CT of abdomen and pelvis to evaluate for possible diverticulitis.  I have ordered hydromorphone for pain.  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  {Document critical care time when appropriate:1} {Document review of labs and clinical decision tools ie heart score, Chads2Vasc2 etc:1}  {Document your independent review of radiology images, and any outside records:1} {Document your discussion with family members, caretakers, and with consultants:1} {Document social determinants of health affecting pt's care:1} {Document your decision making why or why not admission, treatments were needed:1} Final Clinical Impression(s) / ED Diagnoses Final diagnoses:  None    Rx  / DC Orders ED Discharge Orders     None

## 2023-04-07 NOTE — ED Triage Notes (Signed)
LLQ ABD pain Started at 19:00 during BM Pt stated stool was loose.  Denies nausea and vomiting

## 2023-04-08 DIAGNOSIS — N390 Urinary tract infection, site not specified: Secondary | ICD-10-CM | POA: Diagnosis not present

## 2023-04-08 MED ORDER — LEVOFLOXACIN 500 MG PO TABS: 500.0000 mg | ORAL_TABLET | Freq: Every day | ORAL | 0 refills | Status: DC

## 2023-04-08 MED ORDER — LEVOFLOXACIN 500 MG PO TABS: 500.0000 mg | ORAL_TABLET | Freq: Once | ORAL | Status: AC

## 2023-04-08 MED ORDER — LEVOFLOXACIN 500 MG PO TABS
ORAL_TABLET | ORAL | Status: AC
  Administered 2023-04-08: 500 mg via ORAL
  Filled 2023-04-08: qty 1

## 2023-04-08 NOTE — ED Notes (Signed)
Patient transported to CT 

## 2023-04-08 NOTE — Discharge Instructions (Addendum)
Take ibuprofen and/or acetaminophen as needed for pain.  Return to the emergency department if symptoms are getting worse.

## 2023-04-09 LAB — URINE CULTURE: Culture: 80000 — AB

## 2023-04-11 LAB — URINE CULTURE

## 2023-04-12 ENCOUNTER — Telehealth (HOSPITAL_BASED_OUTPATIENT_CLINIC_OR_DEPARTMENT_OTHER): Payer: Self-pay | Admitting: *Deleted

## 2023-04-12 NOTE — Telephone Encounter (Signed)
Post ED Visit - Positive Culture Follow-up: Unsuccessful Patient Follow-up  Culture assessed and recommendations reviewed by:  [x]  Daylene Posey, Pharm.D. []  Celedonio Miyamoto, Pharm.D., BCPS AQ-ID []  Garvin Fila, Pharm.D., BCPS []  Georgina Pillion, Pharm.D., BCPS []  [place], [address].D., BCPS, AAHIVP []  Estella Husk, Pharm.D., BCPS, AAHIVP []  Sherlynn Carbon, PharmD []  Pollyann Samples, PharmD, BCPS  Positive urine culture  []  Patient discharged without antimicrobial prescription and treatment is now indicated [x]  Organism is resistant to prescribed ED discharge antimicrobial []  Patient with positive blood cultures   Unable to contact patient after 3 attempts, letter will be sent to address on file  Bing Quarry 04/12/2023, 11:18 AM

## 2023-04-12 NOTE — Progress Notes (Signed)
ED Antimicrobial Stewardship Positive Culture Follow Up   Marilyn [name] is an 52 y.o. female who presented to [facility] on 04/07/2023 with a chief complaint of  Chief Complaint  Patient presents with   Abdominal Pain    Recent Results (from the past 720 hour(s))  Urine Culture (for pregnant, neutropenic or urologic patients or patients with an indwelling urinary catheter)     Status: Abnormal   Collection Time: 03/31/23  6:20 PM   Specimen: Urine, Clean Catch  Result Value Ref Range Status   Specimen Description   Final    URINE, CLEAN CATCH Performed at [facility], [place], [address] [place], [place] [address]    Special Requests   Final    NONE Performed at [facility], [address], [place], [place] [address]    Culture >=100,000 COLONIES/mL KLEBSIELLA PNEUMONIAE (A)  Final   Report Status 04/03/2023 FINAL  Final   Organism ID, Bacteria KLEBSIELLA PNEUMONIAE (A)  Final      Susceptibility   Klebsiella pneumoniae - MIC*    AMPICILLIN >=32 RESISTANT Resistant     CEFAZOLIN <=4 SENSITIVE Sensitive     CEFEPIME <=0.12 SENSITIVE Sensitive     CEFTRIAXONE <=0.25 SENSITIVE Sensitive     CIPROFLOXACIN <=0.25 SENSITIVE Sensitive     GENTAMICIN <=1 SENSITIVE Sensitive     IMIPENEM <=0.25 SENSITIVE Sensitive     NITROFURANTOIN <=16 SENSITIVE Sensitive     TRIMETH/SULFA <=20 SENSITIVE Sensitive     AMPICILLIN/SULBACTAM 16 INTERMEDIATE Intermediate     PIP/TAZO 16 SENSITIVE Sensitive     * >=100,000 COLONIES/mL KLEBSIELLA PNEUMONIAE  Urine Culture     Status: Abnormal   Collection Time: 04/07/23 10:32 PM   Specimen: Urine, Clean Catch  Result Value Ref Range Status   Specimen Description   Final    URINE, CLEAN CATCH Performed at [facility], [address] [place], [place] [address]    Special Requests   Final    NONE Performed at [facility], [address], [place], [place] [address]    Culture (A)  Final    80,000 COLONIES/mL ENTEROCOCCUS FAECALIS 20,000  COLONIES/mL ENTEROCOCCUS AVIUM    Report Status 04/11/2023 FINAL  Final   Organism ID, Bacteria ENTEROCOCCUS FAECALIS (A)  Final   Organism ID, Bacteria ENTEROCOCCUS AVIUM (A)  Final      Susceptibility   Enterococcus avium - MIC*    AMPICILLIN <=2 SENSITIVE Sensitive     NITROFURANTOIN <=16 SENSITIVE Sensitive     VANCOMYCIN <=0.5 SENSITIVE Sensitive     * 20,000 COLONIES/mL ENTEROCOCCUS AVIUM   Enterococcus faecalis - MIC*    AMPICILLIN <=2 SENSITIVE Sensitive     NITROFURANTOIN <=16 SENSITIVE Sensitive     VANCOMYCIN 1 SENSITIVE Sensitive     * 80,000 COLONIES/mL ENTEROCOCCUS FAECALIS    [x]  Treated with levaquin, organism resistant to prescribed antimicrobial []  Patient discharged originally without antimicrobial agent and treatment is now indicated  New antibiotic prescription: Amoxil  ED Provider: Riki Sheer, PA-C   Daylene Posey 04/12/2023, 8:01 AM Clinical Pharmacist Monday - Friday phone -  [phone] Saturday - Sunday phone - [phone]

## 2023-04-12 NOTE — Telephone Encounter (Signed)
Post ED Visit - Positive Culture Follow-up: Successful Patient Follow-Up  Culture assessed and recommendations reviewed by:  []  Enzo Bi, Pharm.D. []  Celedonio Miyamoto, Pharm.D., BCPS AQ-ID []  Garvin Fila, Pharm.D., BCPS []  Georgina Pillion, Pharm.D., BCPS []  [place], [address].D., BCPS, AAHIVP []  Estella Husk, Pharm.D., BCPS, AAHIVP []  Lysle Pearl, PharmD, BCPS []  Phillips Climes, PharmD, BCPS []  Agapito Games, PharmD, BCPS []  Verlan Friends, PharmD  Positive urine culture  []  Patient discharged without antimicrobial prescription and treatment is now indicated [x]  Organism is resistant to prescribed ED discharge antimicrobial []  Patient with positive blood cultures  Changes discussed with ED provider: Riki Sheer PA New antibiotic prescription Patient return phone call and prescription Amoxil 500mg  q8 X 5 days was called in to [facility] in [facility] [phone]   Contacted patient, date 04/12/23, time 1155   Nena Polio [place] 04/12/2023, 12:01 PM

## 2023-04-26 ENCOUNTER — Other Ambulatory Visit (INDEPENDENT_AMBULATORY_CARE_PROVIDER_SITE_OTHER): Payer: 59

## 2023-04-26 ENCOUNTER — Ambulatory Visit: Payer: 59 | Admitting: Surgical

## 2023-04-26 ENCOUNTER — Other Ambulatory Visit: Payer: Self-pay

## 2023-04-26 DIAGNOSIS — M542 Cervicalgia: Secondary | ICD-10-CM

## 2023-04-26 DIAGNOSIS — M25511 Pain in right shoulder: Secondary | ICD-10-CM | POA: Diagnosis not present

## 2023-04-27 ENCOUNTER — Encounter: Payer: Self-pay | Admitting: Surgical

## 2023-04-27 NOTE — Progress Notes (Signed)
Office Visit Note   Patient: Marilyn [name]           Date of Birth: May 26, 1971           MRN: [id number] Visit Date: 04/26/2023 Requested by: Rebecka Apley, NP [address] [place],  [place] [address] PCP: Rebecka Apley, NP  Subjective: Chief Complaint  Patient presents with   Right Shoulder - Pain    Fall 04/03/2023    HPI: Marilyn [name] is a 52 y.o. female who presents to the office reporting right shoulder pain.  Patient states that she has recently had UTI requiring hospitalization that feels like it interfered with her gait.  She fell on 04/03/2023 landing directly onto her right shoulder.  She notes that she was unable to move the right shoulder for several days due to the amount of pain she was in.  She also noticed new ecchymosis in the lateral aspect of the shoulder the next day after her fall.  She states that she feels the shoulder is quite painful.  She is waking with pain at night.  She has history of right shoulder arthritis with prior injection that gave her relief for about 2 to 3 weeks.  Ambulates with a cane in her right hand.  Hydrocodone has not given her any relief.  Describes primarily anterior pain today without radiation.  However she does also notes numbness and tingling in the arm that is new that has been radiating down from her shoulder all the way down to the palmar and dorsal aspect of her right hand.  No left-sided radicular symptoms or numbness/tingling.  Entire hand is involved.  Denies any incontinence.  Does feel like her shoulder is weaker.  No new neck pain..                ROS: All systems reviewed are negative as they relate to the chief complaint within the history of present illness.  Patient denies fevers or chills.  Assessment & Plan: Visit Diagnoses:  1. Acute pain of right shoulder   2. Neck pain   3. Cervicalgia     Plan: Patient is a 52 year old female who presents for evaluation of right shoulder pain.   Has history of right shoulder osteoarthritis.  Has had prior left reverse shoulder arthroplasty that required several revision surgeries in order to solve a drainage problem from the midportion of the incision.  Left shoulder is doing okay now.  Right shoulder has increased pain since a fall on 04/03/2023 with bruising noted the following day.  She has radiographs taken today demonstrating no obvious fracture though there is some suggestion on the axillary view that the posterior glenoid lip is angled slightly differently compared with the prior axillary view from about 2 months ago which could just be from the different angle of the x-ray being or may be a fracture that is new for [facility].  If this fracture displaces significantly, that could impact potential surgical planning in the future so plan for further evaluation with CT scan of the right shoulder to evaluate glenoid fracture.  Currently however she eligible for reverse shoulder replacement based on her BMI.  She also complains of radicular numbness and tingling that extends down the entirety of her right arm.  This is also new since the fall.  She has numbness that travels down the entirety of the arm into the right hand both on the dorsal and palmar aspect of the hand.  No weakness is noted on exam today aside from some rotator cuff weakness.  Plan to evaluate source of radicular numbness with MRI of the cervical spine.  She does have spinal cord stimulator but she states that this is able to be turned off.  Follow-up after scans to review results.  Follow-Up Instructions: No follow-ups on file.   Orders:  Orders Placed This Encounter  Procedures   XR Shoulder Right   XR Cervical Spine 2 or 3 views   CT SHOULDER RIGHT WO CONTRAST   MR Cervical Spine w/o contrast   No orders of the defined types were placed in this encounter.     Procedures: No procedures performed   Clinical Data: No additional findings.  Objective: Vital Signs: LMP   (LMP Unknown) Comment: tubal ligation  Physical Exam:  Constitutional: Patient appears well-developed HEENT:  Head: Normocephalic Eyes:EOM are normal Neck: Normal range of motion Cardiovascular: Normal rate Pulmonary/chest: Effort normal Neurologic: Patient is alert Skin: Skin is warm Psychiatric: Patient has normal mood and affect  Ortho Exam: Ortho exam demonstrates right shoulder with 20 degrees external rotation, 60 degrees abduction, 140 degrees forward elevation passively.  She has active abduction to about 50 degrees.  She has tenderness diffusely throughout the right shoulder but no ecchymosis noted on inspection.  No cellulitis or skin changes.  She has some tenderness over the acromion and AC joint.  She has decent external rotation strength and subscapularis strength on exam today though these both reproduce her pain.  She has some supraspinatus weakness noted 4/5.  Axillary nerve intact with deltoid firing.  Intact EPL, FPL, finger abduction, pronation/supination, bicep, tricep, deltoid.  Does have some pain that shoots down her arm with looking straight up toward the ceiling and full extension of the cervical spine.  Negative Spurling sign.  Specialty Comments:  No specialty comments available.  Imaging: No results found.   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   Acute metabolic encephalopathy 03/31/2023   Lactic acidosis 12/18/2021   Dyspnea on exertion 12/18/2021   Anemia 12/18/2021   Gout 12/18/2021   Acute pain of left shoulder    Arthritis of left shoulder region    S/P reverse total shoulder arthroplasty, left 11/16/2021   Unilateral primary osteoarthritis, right hip 05/09/2021   S/P lumbar spinal fusion 05/09/2021   S/P insertion of spinal cord stimulator 05/09/2021   H/O total knee replacement, bilateral  01/18/2021   Lumbar post-laminectomy syndrome 04/06/2020   Long-term current use of opiate analgesic 11/04/2019   Leg pain, right 10/20/2019   Dysesthesia 10/20/2019   Lateral femoral cutaneous neuropathy, 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 difficile colitis 05/22/2019   Hypophosphatemia 05/21/2019   Syncope 05/20/2019   OSA on CPAP    Acute gastroenteritis    Anemia due to blood loss, acute 06/18/2018    Class: Acute   Plantar fasciitis of left foot 06/16/2018    Class: Chronic   Tendonitis, Achilles, right 06/16/2018    Class: Chronic   Osteochondral talar dome lesion 06/16/2018    Class: Chronic   Deep incisional surgical site infection    Superficial incisional surgical site infection 06/11/2018    Class: Acute   Right ankle effusion 06/11/2018    Class: Acute   Morbid (severe)  obesity due to excess calories (HCC) 05/29/2018    Class: Chronic   Spondylolisthesis, lumbar region 05/27/2018    Class: Chronic   Spinal stenosis of lumbar region 05/27/2018    Class: Chronic   Urinary tract infection 05/27/2018    Class: Acute   Fusion of spine of lumbar region 05/27/2018   Pain of right hip joint 07/03/2017   Acute right-sided low back pain with right-sided sciatica 03/27/2017   Chronic right shoulder pain 02/13/2017   History of rotator cuff tear 11/30/2016   Impingement syndrome of right shoulder 11/30/2016   Exacerbation of asthma 07/18/2016   Hypokalemia 07/18/2016   Hypertension 07/18/2016   Depression with anxiety 07/18/2016   Hypothyroidism 07/18/2016   Hyperglycemia 07/18/2016   Asthma, chronic 07/18/2016   Surgical wound dehiscence left hip; questionable superficial infection 11/10/2015   Postoperative wound infection 11/10/2015   Osteoarthritis of left hip 09/09/2015   Status post total replacement of left hip 09/09/2015   Obesity (BMI 35.0-39.9 without comorbidity)  04/28/2013   Past Medical History:  Diagnosis Date   Anemia    taking iron now. pt states having no current issues 09/02/2015   Anginal pain (HCC)    pt states experiences chest wall pain pt states related to her asthma    Anxiety    with MRI's   Arthritis    Everywhere   Asthma    Depression    Dizziness    GERD (gastroesophageal reflux disease)    Headache(784.0)    HX OF MIGRAINES   History of bronchitis    Hypertension    Hypothyroidism    takes levothyroxen   OSA on CPAP    wears cpap   Shortness of breath    with exertion   Wears glasses     Family History  Problem Relation Age of Onset   Cancer Mother        colon   Epilepsy Mother    Cancer Father        prostate   Diabetes Father    Hypertension Father    Hypertension Maternal Aunt    Diabetes Maternal Aunt    Hypertension Paternal Aunt     Past Surgical History:  Procedure Laterality Date   APPLICATION OF WOUND VAC  11/16/2021   Procedure: APPLICATION OF WOUND VAC;  Surgeon: Cammy Copa, MD;  Location: [facility] OR;  Service: Orthopedics;;   APPLICATION OF WOUND VAC Left 12/01/2021   Procedure: APPLICATION OF WOUND VAC;  Surgeon: Cammy Copa, MD;  Location: [facility] OR;  Service: Orthopedics;  Laterality: Left;   BACK SURGERY     CESAREAN SECTION     times 2   CHOLECYSTECTOMY     COLONOSCOPY  2020   ENDOMETRIAL ABLATION     I & D EXTREMITY Left 12/06/2021   Procedure: LEFT SHOULDER DEBRIDEMENT AND WOUND CLOSURE;  Surgeon: [name], MD;  Location: [facility] OR;  Service: Orthopedics;  Laterality: Left;   I & D EXTREMITY Left 12/20/2021   Procedure: LEFT SHOULDER DEBRIDEMENT;  Surgeon: [name], MD;  Location: [facility] OR;  Service: Orthopedics;  Laterality: Left;   I & D EXTREMITY Left 12/22/2021   Procedure: LEFT SHOULDER DEBRIDEMENT;  Surgeon: [name], MD;  Location: [facility] OR;  Service: Orthopedics;  Laterality: Left;   INCISION AND DRAINAGE Left 12/01/2021   Procedure: LEFT SHOULDER IRRIGATION AND  EXCISIONAL DEBRIDEMENT;  Surgeon: Cammy Copa, MD;  Location: [facility] OR;  Service: Orthopedics;  Laterality: Left;  INCISION AND DRAINAGE HIP Left 11/10/2015   Procedure: IRRIGATION AND DEBRIDEMENT LEFT HIP INCISION;  Surgeon: Kathryne Hitch, MD;  Location: [facility] OR;  Service: Orthopedics;  Laterality: Left;   JOINT REPLACEMENT  2011   total left knee   KNEE ARTHROPLASTY  04/23/2012   right    KNEE ARTHROSCOPY     LUMBAR LAMINECTOMY/DECOMPRESSION MICRODISCECTOMY N/A 06/23/2019   Procedure: RIGHT LUMBAR FIVE THROUGH SACRAL ONE PARTIAL HEMILAMINECTOMY WITH RIGHT LUMBAR FIVE FORAMINOTOMY;  Surgeon: Kerrin Champagne, MD;  Location: [facility] OR;  Service: Orthopedics;  Laterality: N/A;   LUMBAR WOUND DEBRIDEMENT N/A 06/11/2018   Procedure: LUMBAR WOUND DEBRIDEMENT;  Surgeon: Kerrin Champagne, MD;  Location: [facility] OR;  Service: Orthopedics;  Laterality: N/A;   RADIOLOGY WITH ANESTHESIA N/A 09/09/2018   Procedure: LUMBER SPINE WITHOUT CONTRAST;  Surgeon: Radiologist, Medication, MD;  Location: [facility] OR;  Service: Radiology;  Laterality: N/A;   REVERSE SHOULDER ARTHROPLASTY Left 11/16/2021   Procedure: LEFT REVERSE SHOULDER ARTHROPLASTY;  Surgeon: Cammy Copa, MD;  Location: [facility] OR;  Service: Orthopedics;  Laterality: Left;   ROTATOR CUFF REPAIR     left    SHOULDER SURGERY     right to repair ligament tear   TOTAL HIP ARTHROPLASTY Left 09/09/2015   Procedure: LEFT TOTAL HIP ARTHROPLASTY ANTERIOR APPROACH;  Surgeon: Kathryne Hitch, MD;  Location: [facility] ORS;  Service: Orthopedics;  Laterality: Left;   TOTAL KNEE ARTHROPLASTY  04/23/2012   Procedure: TOTAL KNEE ARTHROPLASTY;  Surgeon: [name], MD;  Location: [facility] OR;  Service: Orthopedics;  Laterality: Right;  Right Total Knee Arthroplasty   TUBAL LIGATION  1996   Social History   Occupational History   Not on file  Tobacco Use   Smoking status: Former    Current packs/day: 0.00    Average packs/day: 0.5 packs/day for 4.0 years (2.0  ttl pk-yrs)    Types: Cigarettes    Start date: 09/18/1983    Quit date: 09/18/1987    Years since quitting: 35.6   Smokeless tobacco: Never  Vaping Use   Vaping status: Never Used  Substance and Sexual Activity   Alcohol use: No   Drug use: No   Sexual activity: Yes    Birth control/protection: Surgical

## 2023-04-29 ENCOUNTER — Telehealth: Payer: Self-pay | Admitting: Orthopaedic Surgery

## 2023-04-29 ENCOUNTER — Ambulatory Visit
Admission: RE | Admit: 2023-04-29 | Discharge: 2023-04-29 | Disposition: A | Discharge status: Home / Self Care | Payer: 59 | Source: Ambulatory Visit | Attending: Surgical | Admitting: Surgical

## 2023-04-29 DIAGNOSIS — M25511 Pain in right shoulder: Secondary | ICD-10-CM

## 2023-04-29 NOTE — Telephone Encounter (Signed)
Patient called and said she went and had the CAT scan and forgot the control to the nerve stimulator, so she wasn't able to get the CAT scan. CB#[phone]. Patient said she would let you know when she get it.

## 2023-04-29 NOTE — Telephone Encounter (Signed)
noted 

## 2023-04-30 NOTE — Telephone Encounter (Signed)
Tech with [facility] imaging cancelled pt exam because pt has spinal stimulator and no one was with her to shut it off long enough to have MRI.

## 2023-05-03 ENCOUNTER — Telehealth: Payer: Self-pay

## 2023-05-03 NOTE — Telephone Encounter (Signed)
Pt called the triage line and did not realize that her CT scan was ordered STAT? She said that she does not know where to go for this and wanted a call back. Can you pease advise? [phone]

## 2023-05-05 ENCOUNTER — Emergency Department (HOSPITAL_COMMUNITY): Payer: 59

## 2023-05-05 ENCOUNTER — Emergency Department (HOSPITAL_COMMUNITY)
Admission: EM | Admit: 2023-05-05 | Discharge: 2023-05-05 | Disposition: A | Discharge status: Home / Self Care | Payer: 59 | Attending: Emergency Medicine | Admitting: Emergency Medicine

## 2023-05-05 ENCOUNTER — Encounter (HOSPITAL_COMMUNITY): Payer: Self-pay

## 2023-05-05 ENCOUNTER — Other Ambulatory Visit: Payer: Self-pay

## 2023-05-05 DIAGNOSIS — M25511 Pain in right shoulder: Secondary | ICD-10-CM | POA: Insufficient documentation

## 2023-05-05 DIAGNOSIS — Z91040 Latex allergy status: Secondary | ICD-10-CM | POA: Diagnosis not present

## 2023-05-05 DIAGNOSIS — W19XXXA Unspecified fall, initial encounter: Secondary | ICD-10-CM | POA: Insufficient documentation

## 2023-05-05 NOTE — ED Notes (Signed)
Pt received her CD of the CT done here today.

## 2023-05-05 NOTE — ED Provider Notes (Signed)
[facility] AT [facility] Provider Note   CSN: [id number] Arrival date & time: 05/05/23  1026     History Chief Complaint  Patient presents with   Shoulder Pain    Marilyn [name] is a 52 y.o. female.  Patient presents to the emergency department complaints of right shoulder pain.  Reports that she had a fall a few days ago and went to orthopedic group and they attempted to have a CT of her shoulder ordered given prior history of surgery in the shoulder. Patient's imaging was ordered stat and patient was able to have this completed due to scheduling issues.  Reports that she is still in considerable pain and is unable to move right shoulder without significant limitations due to pain.  Currently managing with hydrocodone that patient has had prescribed for prior surgery.   Shoulder Pain      Home Medications Prior to Admission medications   Medication Sig Start Date End Date Taking? Authorizing Provider  albuterol (VENTOLIN HFA) 108 (90 Base) MCG/ACT inhaler Inhale 1-2 puffs into the lungs every 6 (six) hours as needed for wheezing or shortness of breath. 12/09/19   Henderly, Britni A, PA-C  allopurinol (ZYLOPRIM) 100 MG tablet Take 1 tablet (100 mg total) by mouth 2 (two) times daily. 02/13/22 03/31/23  Kerrin Champagne, MD  ascorbic Acid (VITAMIN C) 500 MG CPCR Take 500 mg by mouth daily. Gummy    [provider]  budesonide (PULMICORT) 0.5 MG/2ML nebulizer solution Take by nebulization. 01/15/23   [provider]  buPROPion (WELLBUTRIN XL) 150 MG 24 hr tablet Take 150 mg by mouth 2 (two) times daily. Take with 300 mg to =450 mg 06/21/22 07/16/23  [provider]  cyclobenzaprine (FLEXERIL) 10 MG tablet Take 1 tablet by mouth 3 (three) times daily as needed. 01/28/23   [provider]  cycloSPORINE (RESTASIS) 0.05 % ophthalmic emulsion Place 1 drop into both eyes 2 (two) times daily as needed (dry eyes).    [provider]  DULoxetine (CYMBALTA) 60 MG capsule Take 60 mg by mouth 2 (two) times daily. 05/02/21   [provider]  Ferrous Gluconate 324 (37.5 Fe) MG TABS Take 324 mg by mouth daily. 02/14/20   [provider]  furosemide (LASIX) 20 MG tablet Take 20 mg by mouth daily.    [provider]  HYDROcodone-acetaminophen (NORCO/VICODIN) 5-325 MG tablet Take 1 tablet by mouth every 8 (eight) hours as needed. 01/20/23   [provider]  ipratropium-albuterol (DUONEB) 0.5-2.5 (3) MG/3ML SOLN Inhale into the lungs. 02/15/23   [provider]  levofloxacin (LEVAQUIN) 500 MG tablet Take 1 tablet (500 mg total) by mouth daily. 04/08/23   Dione Booze, MD  levothyroxine (SYNTHROID) 150 MCG tablet Take 150 mcg by mouth daily before breakfast. 05/18/19   [provider]  naloxone [facility]) nasal spray 4 mg/0.1 mL Place into the nose. 08/21/22   [provider]  nystatin (MYCOSTATIN/NYSTOP) powder Apply 1 Application topically 3 (three) times daily. 11/06/22   [provider]  omeprazole (PRILOSEC) 40 MG capsule Take 40 mg by mouth 2 (two) times daily.    [provider]  oxybutynin (DITROPAN XL) 15 MG 24 hr tablet Take 15 mg by mouth daily.  05/31/17   [provider]  pregabalin (LYRICA) 75 MG capsule Take 1 capsule (75 mg total) by mouth 2 (two) times daily. 08/07/22   London Sheer, MD  vitamin B-12 (CYANOCOBALAMIN) 250 MCG  tablet Take 250 mcg by mouth daily.    [provider]      Allergies    Lisinopril, Bee venom, Bupropion, Latex, Propoxyphene, Methadone, Codeine, Meloxicam, Morphine and codeine, Tegaderm ag mesh [silver], and Tomato    Review of Systems   Review of Systems  Musculoskeletal:        Right shoulder pain  All other systems reviewed and are negative.   Physical Exam Updated Vital Signs BP (!) 157/80 (BP Location: Right Arm)   Pulse 71   Temp 97.9 F (36.6 C) (Oral)   Resp 18   Ht 5'  3" (1.6 m)   Wt (!) 178.6 kg   LMP  (LMP Unknown) Comment: tubal ligation  SpO2 100%   BMI 69.76 kg/m  Physical Exam Vitals and nursing note reviewed.  HENT:     Head: Normocephalic and atraumatic.  Eyes:     General: No scleral icterus.       Right eye: No discharge.        Left eye: No discharge.  Cardiovascular:     Rate and Rhythm: Normal rate and regular rhythm.  Musculoskeletal:        General: Tenderness and signs of injury present. No swelling or deformity.     Comments: Pain with ROM testing. Positive Neers. Unable to perform lift off. Pain with active external and internal rotation of the shoulder with elbow flexed at 90 degrees.  Skin:    Findings: No rash.     ED Results / Procedures / Treatments   Labs (all labs ordered are listed, but only abnormal results are displayed) Labs Reviewed - No data to display  EKG None  Radiology CT Shoulder Right Wo Contrast  Result Date: 05/05/2023 CLINICAL DATA:  Right shoulder pain status post fall on 04/03/2023 EXAM: CT OF THE UPPER RIGHT EXTREMITY WITHOUT CONTRAST TECHNIQUE: Multidetector CT imaging of the upper right extremity was performed according to the standard protocol. RADIATION DOSE REDUCTION: This exam was performed according to the departmental dose-optimization program which includes automated exposure control, adjustment of the mA and/or kV according to patient size and/or use of iterative reconstruction technique. COMPARISON:  None Available. FINDINGS: Bones/Joint/Cartilage No fracture or dislocation. Normal alignment. No joint effusion. Severe osteoarthritis of the glenohumeral joint. Mild arthropathy of the acromioclavicular joint. Ligaments Ligaments are suboptimally evaluated by CT. Muscles and Tendons Muscles are normal.  No muscle atrophy. Soft tissue No fluid collection or hematoma. No soft tissue mass. Visualized portions of the lung are clear. Thoracic aortic atherosclerosis. IMPRESSION: 1. No acute osseous  injury of the right shoulder. 2. Severe osteoarthritis of the glenohumeral joint. 3.  Aortic Atherosclerosis (ICD10-I70.0). Electronically Signed   By: Elige Ko M.D.   On: 05/05/2023 16:58    Procedures Procedures   Medications Ordered in ED Medications - No data to display  ED Course/ Medical Decision Making/ A&P                               Medical Decision Making Amount and/or Complexity of Data Reviewed Radiology: ordered.   This patient presents to the ED for concern of shoulder pain. Differential diagnosis includes shoulder dislocation, labral tear, shifted     Imaging Studies ordered:  I ordered imaging studies including CT right shoulder I independently visualized and interpreted imaging which showed no evidence of any acute abnormality, severe osteoarthritis I agree with the radiologist interpretation   Problem List /  ED Course:  Patient presents to the emergency department concerns of right shoulder pain.  Reports prior history of surgery in that shoulder due to rotator cuff issues.  Reports a recent fall and was seen by her orthopedist who ordered CT of the right shoulder.  Patient was able to have this performed and is now here to have CT imaging performed per request by orthopedics.  Patient denies significant limitations in range of motion of the right shoulder but does report that there is pain with movement.  Patient is neurovascularly intact and obvious deformity noted on physical examination.  Will order CT imaging. CT negative for any acute findings.  Severe osteoarthritis noted.  Patient does report that she has been taking hydrocodone at home for continued pain control and does report this is helping.  Encourage patient to follow-up with orthopedics for further evaluation of symptoms now that she has CT imaging available.  Will attempt to provide patient with a disc copy of this imaging per patient's request.  Encourage patient return the emergency department  if she has any acute worsening decline in symptoms or has another fall.  Strict return precautions discussed.  Patient is agreeable to treatment plan verbalized understanding return precautions. All questions answered prior to patient discharge.  Final Clinical Impression(s) / ED Diagnoses Final diagnoses:  Acute pain of right shoulder    Rx / DC Orders ED Discharge Orders     None         Smitty Knudsen, PA-C 05/05/23 1734    Loetta Rough, MD 05/05/23 [phone]

## 2023-05-05 NOTE — Discharge Instructions (Signed)
You were seen in the ER today for right shoulder pain. Your CT did not demonstrate any acute injury, however severe arthritis is present. I would recommend planning on following up with your orthopedist for further evaluation now that you have the imaging performed. If symptoms worsen and you cannot be seen by orthopedics, return to the ER.

## 2023-05-05 NOTE — ED Notes (Signed)
Pt returned from CT °

## 2023-05-05 NOTE — ED Triage Notes (Signed)
Pt stated that she fell on 8/17 and went to her PCP who ordered a CT to be done, but pt was not able to get it done. Pt then asked her PCP is she could come here to have a CT scan done for her shoulder. Pt stated that she now has pain in her right leg as well as her shoulder. Pt ambulatory to triage,

## 2023-05-06 NOTE — Telephone Encounter (Signed)
I discussed results with her over the weekend. She just needs GH injection this week whenever is available

## 2023-05-06 NOTE — Telephone Encounter (Signed)
Per Martie Lee, this has been addressed.

## 2023-05-07 ENCOUNTER — Emergency Department (HOSPITAL_BASED_OUTPATIENT_CLINIC_OR_DEPARTMENT_OTHER)
Admission: EM | Admit: 2023-05-07 | Discharge: 2023-05-07 | Disposition: A | Discharge status: Home / Self Care | Payer: 59 | Attending: Emergency Medicine | Admitting: Emergency Medicine

## 2023-05-07 ENCOUNTER — Emergency Department (HOSPITAL_BASED_OUTPATIENT_CLINIC_OR_DEPARTMENT_OTHER): Payer: 59 | Admitting: Radiology

## 2023-05-07 ENCOUNTER — Encounter (HOSPITAL_BASED_OUTPATIENT_CLINIC_OR_DEPARTMENT_OTHER): Payer: Self-pay

## 2023-05-07 ENCOUNTER — Other Ambulatory Visit: Payer: Self-pay

## 2023-05-07 ENCOUNTER — Other Ambulatory Visit: Payer: Self-pay | Admitting: Specialist

## 2023-05-07 DIAGNOSIS — I10 Essential (primary) hypertension: Secondary | ICD-10-CM | POA: Insufficient documentation

## 2023-05-07 DIAGNOSIS — Z91040 Latex allergy status: Secondary | ICD-10-CM | POA: Diagnosis not present

## 2023-05-07 DIAGNOSIS — M25551 Pain in right hip: Secondary | ICD-10-CM | POA: Insufficient documentation

## 2023-05-07 DIAGNOSIS — Z79899 Other long term (current) drug therapy: Secondary | ICD-10-CM | POA: Insufficient documentation

## 2023-05-07 MED ORDER — HYDROCODONE-ACETAMINOPHEN 5-325 MG PO TABS
1.0000 | ORAL_TABLET | Freq: Once | ORAL | Status: AC
  Administered 2023-05-07: 1 via ORAL
  Filled 2023-05-07: qty 1

## 2023-05-07 NOTE — ED Provider Notes (Signed)
[place] EMERGENCY DEPARTMENT AT [facility] Provider Note   CSN: [id number] Arrival date & time: 05/07/23  1419     History  Chief Complaint  Patient presents with   Fall   Hip Pain    right    Marilyn [name] is a 52 y.o. female.   Fall  Hip Pain  52 year old female history of chronic pain, GERD, hypertension, depression, arthritis presenting for right hip pain.  Patient states she has had frequent falls lately due to chronic pain.  She states she has chronic right-sided hip pain.  Today she was trying to get out of bed when she felt like her right hip started to hurt a lot and her leg locked up.  Because she difficulty moving it she fell and landed on her right hip.  She did not hit her head or lose consciousness.  Is not on anticoagulation.  No headache or neurologic changes or neck pain.  She has no back pain, she has right shoulder pain from recent fall where she was evaluated the ED with a negative x-ray.  No new symptom is right hip pain.  No radicular pain, groin numbness, saddle esthesia or trouble to bathroom.  He did not have any dizziness or presyncope and has been eating and drinking normally.  No vomiting or diarrhea or fevers.  No medication changes other than running out of her Norco last night.  She follows with orthopedics and her primary care doctor.     Home Medications Prior to Admission medications   Medication Sig Start Date End Date Taking? Authorizing Provider  albuterol (VENTOLIN HFA) 108 (90 Base) MCG/ACT inhaler Inhale 1-2 puffs into the lungs every 6 (six) hours as needed for wheezing or shortness of breath. 12/09/19   Henderly, Britni A, PA-C  allopurinol (ZYLOPRIM) 100 MG tablet Take 1 tablet (100 mg total) by mouth 2 (two) times daily. 02/13/22 03/31/23  Kerrin Champagne, MD  ascorbic Acid (VITAMIN C) 500 MG CPCR Take 500 mg by mouth daily. Gummy    [provider]  budesonide (PULMICORT) 0.5 MG/2ML nebulizer solution Take by  nebulization. 01/15/23   [provider]  buPROPion (WELLBUTRIN XL) 150 MG 24 hr tablet Take 150 mg by mouth 2 (two) times daily. Take with 300 mg to =450 mg 06/21/22 07/16/23  [provider]  cyclobenzaprine (FLEXERIL) 10 MG tablet Take 1 tablet by mouth 3 (three) times daily as needed. 01/28/23   [provider]  cycloSPORINE (RESTASIS) 0.05 % ophthalmic emulsion Place 1 drop into both eyes 2 (two) times daily as needed (dry eyes).    [provider]  DULoxetine (CYMBALTA) 60 MG capsule Take 60 mg by mouth 2 (two) times daily. 05/02/21   [provider]  Ferrous Gluconate 324 (37.5 Fe) MG TABS Take 324 mg by mouth daily. 02/14/20   [provider]  furosemide (LASIX) 20 MG tablet Take 20 mg by mouth daily.    [provider]  HYDROcodone-acetaminophen (NORCO/VICODIN) 5-325 MG tablet Take 1 tablet by mouth every 8 (eight) hours as needed. 01/20/23   [provider]  ipratropium-albuterol (DUONEB) 0.5-2.5 (3) MG/3ML SOLN Inhale into the lungs. 02/15/23   [provider]  levofloxacin (LEVAQUIN) 500 MG tablet Take 1 tablet (500 mg total) by mouth daily. 04/08/23   Dione Booze, MD  levothyroxine (SYNTHROID) 150 MCG tablet Take 150 mcg by mouth daily before breakfast. 05/18/19   [provider]  naloxone [facility]) nasal spray 4 mg/0.1 mL  Place into the nose. 08/21/22   [provider]  nystatin (MYCOSTATIN/NYSTOP) powder Apply 1 Application topically 3 (three) times daily. 11/06/22   [provider]  omeprazole (PRILOSEC) 40 MG capsule Take 40 mg by mouth 2 (two) times daily.    [provider]  oxybutynin (DITROPAN XL) 15 MG 24 hr tablet Take 15 mg by mouth daily.  05/31/17   [provider]  pregabalin (LYRICA) 75 MG capsule Take 1 capsule (75 mg total) by mouth 2 (two) times daily. 08/07/22   London Sheer, MD  vitamin B-12 (CYANOCOBALAMIN) 250 MCG tablet Take 250 mcg by mouth daily.     [provider]      Allergies    Lisinopril, Bee venom, Bupropion, Latex, Propoxyphene, Methadone, Codeine, Meloxicam, Morphine and codeine, Tegaderm ag mesh [silver], and Tomato    Review of Systems   Review of Systems Review of systems completed and notable as per HPI.  ROS otherwise negative.   Physical Exam Updated Vital Signs BP 130/66   Pulse 67   Temp (!) 97.4 F (36.3 C) (Oral)   Resp (!) 22   Ht 5\' 2"  (1.575 m)   Wt (!) 178.3 kg   LMP  (LMP Unknown) Comment: tubal ligation  SpO2 100%   BMI 71.88 kg/m  Physical Exam Vitals and nursing note reviewed.  Constitutional:      General: She is not in acute distress.    Appearance: She is well-developed.  HENT:     Head: Normocephalic and atraumatic.  Eyes:     Extraocular Movements: Extraocular movements intact.     Conjunctiva/sclera: Conjunctivae normal.     Pupils: Pupils are equal, round, and reactive to light.  Cardiovascular:     Rate and Rhythm: Normal rate and regular rhythm.     Heart sounds: No murmur heard. Pulmonary:     Effort: Pulmonary effort is normal. No respiratory distress.     Breath sounds: Normal breath sounds.  Abdominal:     Palpations: Abdomen is soft.     Tenderness: There is no abdominal tenderness.  Musculoskeletal:        General: No swelling.     Cervical back: Normal range of motion and neck supple. No rigidity or tenderness.     Right lower leg: No edema.     Left lower leg: No edema.     Comments: Mild tenderness and pain with any motion of the right hip.  2+ DP and PT pulse.  She has full strength at the hip, knee, ankle.  Normal sensation throughout.  No skin changes.  No spinal tenderness.  Full range of motion of the neck without pain or tenderness.  Skin:    General: Skin is warm and dry.     Capillary Refill: Capillary refill takes less than 2 seconds.  Neurological:     General: No focal deficit present.     Mental Status: She is alert and oriented to person,  place, and time. Mental status is at baseline.  Psychiatric:        Mood and Affect: Mood normal.     ED Results / Procedures / Treatments   Labs (all labs ordered are listed, but only abnormal results are displayed) Labs Reviewed - No data to display  EKG None  Radiology DG Hip Unilat  With Pelvis 2-3 Views Right  Result Date: 05/07/2023 CLINICAL DATA:  Right hip pain following a fall today. EXAM: DG HIP (WITH OR WITHOUT PELVIS) 2-3V  RIGHT COMPARISON:  Abdomen and pelvis CT dated 04/07/2023. Right hip CT dated 10/31/2021. FINDINGS: Minimal right hip degenerative changes. No fracture or dislocation. Stable left hip prosthesis and lumbosacral spine fixation hardware. IMPRESSION: 1. No fracture. 2. Minimal right hip degenerative changes. Electronically Signed   By: Beckie Salts M.D.   On: 05/07/2023 17:07    Procedures Procedures    Medications Ordered in ED Medications  HYDROcodone-acetaminophen (NORCO/VICODIN) 5-325 MG per tablet 1 tablet (1 tablet Oral Given 05/07/23 1659)  HYDROcodone-acetaminophen (NORCO/VICODIN) 5-325 MG per tablet 1 tablet (1 tablet Oral Given 05/07/23 1813)    ED Course/ Medical Decision Making/ A&P                                 Medical Decision Making Amount and/or Complexity of Data Reviewed Radiology: ordered.  Risk Prescription drug management.   Medical Decision Making:   ATALAYA KLIMENT is a 52 y.o. female who presented to the ED today with mechanical fall and right hip pain.  Patient has chronic right hip pain and stated today she felt her pain got worse and felt like her leg locked up.  This caused her to fall.  No presyncope or syncope, I do not think labs are warranted at this time.  She has had a lot of falls related to this although has significant history of arthritis and chronic pain.  On exam she is neurovascularly intact, mildly tender around the right hip but no signs of infection.  Reviewed her x-ray, see degenerative changes but  also any signs of acute fracture.  She has been able to bear weight on this.  Will treat symptomatically here and await radiology read.  I do not see any other signs of acute trauma including no signs of head or neck trauma.  Reassessment and Plan:   X-ray reviewed, shows degenerative changes but no acute fracture.  Pain is improved on reevaluation.  She is able to ambulate.  Her pain is acute on chronic, she has follow-up with her pain provider tomorrow.  I do not see any signs of acute injury here that would require hospitalization.  Suspect muscle skeletal strain and chronic degenerative changes.  I recommend she follow-up with her pain doctor tomorrow and her PCP.  Discharged with return precautions.   Patient's presentation is most consistent with acute complicated illness / injury requiring diagnostic workup.  ,         Final Clinical Impression(s) / ED Diagnoses Final diagnoses:  Right hip pain    Rx / DC Orders ED Discharge Orders     None         Marilyn Spates, MD 05/07/23 2359

## 2023-05-07 NOTE — Discharge Instructions (Signed)
Follow-up with your pain doctor tomorrow primary care doctor within the next week.  If you develop severe pain or any other new concerning symptoms you should return to the ED.

## 2023-05-07 NOTE — ED Triage Notes (Signed)
Pt to ED c/o fall, Reports fell out of bed at aprox 330 am today. Now c/o right hip pain. Pt did not hit head. No LOC. Not on blood thinner. Ambulatory since fall.

## 2023-05-07 NOTE — Telephone Encounter (Signed)
Raelyn Number, can you get Jalani to see Dr Magnus Ivan or Bronson Curb?

## 2023-05-08 NOTE — Telephone Encounter (Signed)
I would have her see if her PCP thinks she needs to continue taking this

## 2023-05-09 ENCOUNTER — Ambulatory Visit: Payer: 59 | Admitting: Orthopaedic Surgery

## 2023-05-09 ENCOUNTER — Ambulatory Visit: Payer: 59 | Admitting: Physician Assistant

## 2023-05-10 ENCOUNTER — Other Ambulatory Visit: Payer: Self-pay

## 2023-05-10 ENCOUNTER — Ambulatory Visit (INDEPENDENT_AMBULATORY_CARE_PROVIDER_SITE_OTHER): Payer: 59 | Admitting: Surgical

## 2023-05-10 ENCOUNTER — Telehealth: Payer: Self-pay

## 2023-05-10 DIAGNOSIS — M25511 Pain in right shoulder: Secondary | ICD-10-CM

## 2023-05-10 DIAGNOSIS — M19011 Primary osteoarthritis, right shoulder: Secondary | ICD-10-CM | POA: Diagnosis not present

## 2023-05-10 DIAGNOSIS — G8929 Other chronic pain: Secondary | ICD-10-CM

## 2023-05-10 MED ORDER — ALLOPURINOL 100 MG PO TABS: 100.0000 mg | ORAL_TABLET | Freq: Two times a day (BID) | ORAL | 0 refills | Status: DC

## 2023-05-10 NOTE — Telephone Encounter (Signed)
Called and left message for pt to return my call

## 2023-05-10 NOTE — Telephone Encounter (Signed)
Patient states he hasn't heard anything from [facility] for her neck MRI

## 2023-05-12 ENCOUNTER — Encounter: Payer: Self-pay | Admitting: Surgical

## 2023-05-12 MED ORDER — BUPIVACAINE HCL 0.25 % IJ SOLN
9.0000 mL | INTRAMUSCULAR | Status: AC | PRN
Start: 2023-05-10 — End: 2023-05-10
  Administered 2023-05-10: 9 mL via INTRA_ARTICULAR

## 2023-05-12 MED ORDER — LIDOCAINE HCL 1 % IJ SOLN
5.0000 mL | INTRAMUSCULAR | Status: AC | PRN
Start: 2023-05-10 — End: 2023-05-10
  Administered 2023-05-10: 5 mL

## 2023-05-12 MED ORDER — METHYLPREDNISOLONE ACETATE 40 MG/ML IJ SUSP
40.0000 mg | INTRAMUSCULAR | Status: AC | PRN
Start: 2023-05-10 — End: 2023-05-10
  Administered 2023-05-10: 40 mg via INTRA_ARTICULAR

## 2023-05-12 NOTE — Progress Notes (Signed)
   Procedure Note  Patient: Marilyn [name]             Date of Birth: 1971-01-27           MRN: [id number]             Visit Date: 05/10/2023  Procedures: Visit Diagnoses:  1. Glenohumeral arthritis, right   2. Chronic right shoulder pain     Large Joint Inj: R glenohumeral on 05/10/2023 12:43 PM Details: 22 G 3.5 in needle, ultrasound-guided posterior approach Medications: 5 mL lidocaine 1 %; 9 mL bupivacaine 0.25 %; 40 mg methylPREDNISolone acetate 40 MG/ML Outcome: tolerated well, no immediate complications Procedure, treatment alternatives, risks and benefits explained, specific risks discussed. Consent was given by the patient. Patient was prepped and draped in the usual sterile fashion.

## 2023-05-15 ENCOUNTER — Telehealth: Payer: Self-pay | Admitting: Physician Assistant

## 2023-05-15 ENCOUNTER — Encounter: Payer: Self-pay | Admitting: Physician Assistant

## 2023-05-15 ENCOUNTER — Ambulatory Visit (INDEPENDENT_AMBULATORY_CARE_PROVIDER_SITE_OTHER): Payer: 59 | Admitting: Physician Assistant

## 2023-05-15 DIAGNOSIS — M5416 Radiculopathy, lumbar region: Secondary | ICD-10-CM | POA: Diagnosis not present

## 2023-05-15 NOTE — Telephone Encounter (Signed)
Called pt and informed her [facility] is needing the stimulator information from her and then they will need to research it to see if compatible for MRI. Gave pt number of [phone] to call.

## 2023-05-15 NOTE — Telephone Encounter (Signed)
Patient called asked if she is suppose to go to [facility] today or tomorrow? The number to contact patient is [phone]

## 2023-05-15 NOTE — Progress Notes (Signed)
Office Visit Note   Patient: Marilyn [name]           Date of Birth: [birth year]           MRN: [id number] Visit Date: 05/15/2023              Requested by: Rebecka Apley, NP [address] [place],  [place] [address] PCP: Rebecka Apley, NP  No chief complaint on file.     HPI: Patient is a pleasant 52 year old woman with a chief complaint of her right leg "going numb and giving way.  She is a patient of Dr. Langston Masker.  He last saw her in May and wanted her to follow-up for new x-rays in 12 weeks.  She has not done that.  She was not aware of it.  She says she has had a 1 month history of now her right leg going numb and giving way.  Has a little bit of pain in the groin.  Describes her pain as moderate and is in the bilateral buttocks and then shoots down the right leg when her leg goes numb she is fallen several times in the last month  Assessment & Plan: Visit Diagnoses: Low back pain  Plan: The patient has had progression of her symptoms Dr. Christell Constant had wanted to see her back for new spine x-rays including flexion and extension views.  I discussed with the patient today that this would be better as he has an opening tomorrow at 145.  She also is in the process of getting studies on her cervical spine this is complicated by the fact that she has a stimulator.  Follow-Up Instructions: No follow-ups on file.   Ortho Exam Examination of her spine shows no deformity no redness no erythema.  She has no pain with manipulation and logrolling of her right hip no groin pain she actually has pretty good motion.  She has good flexion extension does have pain reproduced with straight leg raise bilaterally in her buttocks.  Sensation currently is intact.  Compartments are soft and nontender Patient is alert, oriented, no adenopathy, well-dressed, normal affect, normal respiratory effort.   Imaging: No results found. No images are attached to the  encounter.  Labs: Lab Results  Component Value Date   HGBA1C 6.2 (H) 07/18/2016   HGBA1C 6.2 (H) 09/02/2015   ESRSEDRATE 106 (H) 12/18/2021   ESRSEDRATE 70 (H) 12/04/2021   ESRSEDRATE 56 (H) 01/08/2019   CRP 5.2 (H) 12/17/2021   CRP 1.8 (H) 12/08/2021   CRP 2.5 (H) 12/04/2021   LABURIC 8.6 (H) 12/18/2021   LABURIC 5.9 01/08/2019   LABURIC 8.7 (H) 06/16/2018   REPTSTATUS 04/11/2023 FINAL 04/07/2023   GRAMSTAIN NO WBC SEEN NO ORGANISMS SEEN  12/20/2021   CULT (A) 04/07/2023    80,000 COLONIES/mL ENTEROCOCCUS FAECALIS 20,000 COLONIES/mL ENTEROCOCCUS AVIUM    LABORGA ENTEROCOCCUS FAECALIS (A) 04/07/2023   LABORGA ENTEROCOCCUS AVIUM (A) 04/07/2023     Lab Results  Component Value Date   ALBUMIN 3.9 04/07/2023   ALBUMIN 3.8 03/31/2023   ALBUMIN 3.6 10/12/2022    Lab Results  Component Value Date   MG 1.8 03/31/2023   MG 2.2 12/23/2021   MG 1.6 (L) 12/19/2021   No results found for: "VD25OH"  No results found for: "PREALBUMIN"    Latest Ref Rng & Units 04/07/2023    8:44 PM 04/01/2023    4:55 AM 03/31/2023    3:26 PM  CBC EXTENDED  WBC  4.0 - 10.5 K/uL 5.6  4.9  6.2   RBC 3.87 - 5.11 MIL/uL 3.74  3.33  3.71   Hemoglobin 12.0 - 15.0 g/dL 21.3  9.5  08.6   HCT 57.8 - 46.0 % 34.1  30.0  33.5   Platelets 150 - 400 K/uL 203  216  226   NEUT# 1.7 - 7.7 K/uL   3.5   Lymph# 0.7 - 4.0 K/uL   1.9      There is no height or weight on file to calculate BMI.  Orders:  No orders of the defined types were placed in this encounter.  No orders of the defined types were placed in this encounter.    Procedures: No procedures performed  Clinical Data: No additional findings.  ROS:  All other systems negative, except as noted in the HPI. Review of Systems  Objective: Vital Signs: LMP  (LMP Unknown) Comment: tubal ligation  Specialty Comments:  No specialty comments available.  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   Acute metabolic encephalopathy 03/31/2023   Lactic acidosis 12/18/2021   Dyspnea on exertion 12/18/2021   Anemia 12/18/2021   Gout 12/18/2021   Acute pain of left shoulder    Arthritis of left shoulder region    S/P reverse total shoulder arthroplasty, left 11/16/2021   Unilateral primary osteoarthritis, right hip 05/09/2021   S/P lumbar spinal fusion 05/09/2021   S/P insertion of spinal cord stimulator 05/09/2021   H/O total knee replacement, bilateral 01/18/2021   Lumbar post-laminectomy syndrome 04/06/2020   Long-term current use of opiate analgesic 11/04/2019   Leg pain, right 10/20/2019   Dysesthesia 10/20/2019   Lateral femoral cutaneous neuropathy, 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 difficile colitis 05/22/2019   Hypophosphatemia 05/21/2019   Syncope 05/20/2019   OSA on CPAP    Acute gastroenteritis    Anemia due to blood loss, acute 06/18/2018    Class: Acute   Plantar fasciitis of left foot 06/16/2018    Class: Chronic   Tendonitis, Achilles, right 06/16/2018    Class: Chronic   Osteochondral talar dome lesion 06/16/2018    Class: Chronic   Deep incisional surgical site infection    Superficial incisional surgical site infection 06/11/2018    Class: Acute   Right ankle effusion 06/11/2018    Class: Acute   Morbid (severe) obesity due to excess calories (HCC) 05/29/2018    Class: Chronic   Spondylolisthesis, lumbar region 05/27/2018    Class: Chronic   Spinal stenosis of lumbar region 05/27/2018    Class: Chronic   Urinary tract infection 05/27/2018    Class: Acute   Fusion of spine of lumbar region 05/27/2018   Pain of right hip joint 07/03/2017   Acute right-sided low back pain with right-sided sciatica 03/27/2017   Chronic right shoulder pain 02/13/2017    History of rotator cuff tear 11/30/2016   Impingement syndrome of right shoulder 11/30/2016   Exacerbation of asthma 07/18/2016   Hypokalemia 07/18/2016   Hypertension 07/18/2016   Depression with anxiety 07/18/2016   Hypothyroidism 07/18/2016   Hyperglycemia 07/18/2016   Asthma, chronic 07/18/2016   Surgical wound dehiscence left hip; questionable superficial infection 11/10/2015   Postoperative wound infection 11/10/2015   Osteoarthritis of left hip 09/09/2015   Status  post total replacement of left hip 09/09/2015   Obesity (BMI 35.0-39.9 without comorbidity) 04/28/2013   Past Medical History:  Diagnosis Date   Anemia    taking iron now. pt states having no current issues 09/02/2015   Anginal pain (HCC)    pt states experiences chest wall pain pt states related to her asthma    Anxiety    with MRI's   Arthritis    Everywhere   Asthma    Depression    Dizziness    GERD (gastroesophageal reflux disease)    Headache(784.0)    HX OF MIGRAINES   History of bronchitis    Hypertension    Hypothyroidism    takes levothyroxen   OSA on CPAP    wears cpap   Shortness of breath    with exertion   Wears glasses     Family History  Problem Relation Age of Onset   Cancer Mother        colon   Epilepsy Mother    Cancer Father        prostate   Diabetes Father    Hypertension Father    Hypertension Maternal Aunt    Diabetes Maternal Aunt    Hypertension Paternal Aunt     Past Surgical History:  Procedure Laterality Date   APPLICATION OF WOUND VAC  11/16/2021   Procedure: APPLICATION OF WOUND VAC;  Surgeon: Cammy Copa, MD;  Location: [facility] OR;  Service: Orthopedics;;   APPLICATION OF WOUND VAC Left 12/01/2021   Procedure: APPLICATION OF WOUND VAC;  Surgeon: Cammy Copa, MD;  Location: [facility] OR;  Service: Orthopedics;  Laterality: Left;   BACK SURGERY     CESAREAN SECTION     times 2   CHOLECYSTECTOMY     COLONOSCOPY  2020   ENDOMETRIAL ABLATION     I & D  EXTREMITY Left 12/06/2021   Procedure: LEFT SHOULDER DEBRIDEMENT AND WOUND CLOSURE;  Surgeon: [name], MD;  Location: [facility] OR;  Service: Orthopedics;  Laterality: Left;   I & D EXTREMITY Left 12/20/2021   Procedure: LEFT SHOULDER DEBRIDEMENT;  Surgeon: [name], MD;  Location: [facility] OR;  Service: Orthopedics;  Laterality: Left;   I & D EXTREMITY Left 12/22/2021   Procedure: LEFT SHOULDER DEBRIDEMENT;  Surgeon: [name], MD;  Location: [facility] OR;  Service: Orthopedics;  Laterality: Left;   INCISION AND DRAINAGE Left 12/01/2021   Procedure: LEFT SHOULDER IRRIGATION AND EXCISIONAL DEBRIDEMENT;  Surgeon: Cammy Copa, MD;  Location: [facility] OR;  Service: Orthopedics;  Laterality: Left;   INCISION AND DRAINAGE HIP Left 11/10/2015   Procedure: IRRIGATION AND DEBRIDEMENT LEFT HIP INCISION;  Surgeon: Kathryne Hitch, MD;  Location: [facility] OR;  Service: Orthopedics;  Laterality: Left;   JOINT REPLACEMENT  2011   total left knee   KNEE ARTHROPLASTY  04/23/2012   right    KNEE ARTHROSCOPY     LUMBAR LAMINECTOMY/DECOMPRESSION MICRODISCECTOMY N/A 06/23/2019   Procedure: RIGHT LUMBAR FIVE THROUGH SACRAL ONE PARTIAL HEMILAMINECTOMY WITH RIGHT LUMBAR FIVE FORAMINOTOMY;  Surgeon: Kerrin Champagne, MD;  Location: [facility] OR;  Service: Orthopedics;  Laterality: N/A;   LUMBAR WOUND DEBRIDEMENT N/A 06/11/2018   Procedure: LUMBAR WOUND DEBRIDEMENT;  Surgeon: Kerrin Champagne, MD;  Location: [facility] OR;  Service: Orthopedics;  Laterality: N/A;   RADIOLOGY WITH ANESTHESIA N/A 09/09/2018   Procedure: LUMBER SPINE WITHOUT CONTRAST;  Surgeon: Radiologist, Medication, MD;  Location: [facility] OR;  Service: Radiology;  Laterality: N/A;  REVERSE SHOULDER ARTHROPLASTY Left 11/16/2021   Procedure: LEFT REVERSE SHOULDER ARTHROPLASTY;  Surgeon: Cammy Copa, MD;  Location[facility] OR;  Service: Orthopedics;  Laterality: Left;   ROTATOR CUFF REPAIR     left    SHOULDER SURGERY     right to repair ligament tear   TOTAL HIP ARTHROPLASTY  Left 09/09/2015   Procedure: LEFT TOTAL HIP ARTHROPLASTY ANTERIOR APPROACH;  Surgeon: Kathryne Hitch, MD;  Location: [facility] ORS;  Service: Orthopedics;  Laterality: Left;   TOTAL KNEE ARTHROPLASTY  04/23/2012   Procedure: TOTAL KNEE ARTHROPLASTY;  Surgeon: [name], MD;  Location: [facility] OR;  Service: Orthopedics;  Laterality: Right;  Right Total Knee Arthroplasty   TUBAL LIGATION  1996   Social History   Occupational History   Not on file  Tobacco Use   Smoking status: Former    Current packs/day: 0.00    Average packs/day: 0.5 packs/day for 4.0 years (2.0 ttl pk-yrs)    Types: Cigarettes    Start date: 09/18/1983    Quit date: 09/18/1987    Years since quitting: 35.6   Smokeless tobacco: Never  Vaping Use   Vaping status: Never Used  Substance and Sexual Activity   Alcohol use: No   Drug use: No   Sexual activity: Yes    Birth control/protection: Surgical

## 2023-05-16 ENCOUNTER — Ambulatory Visit (INDEPENDENT_AMBULATORY_CARE_PROVIDER_SITE_OTHER): Payer: 59 | Admitting: Orthopedic Surgery

## 2023-05-16 ENCOUNTER — Other Ambulatory Visit (INDEPENDENT_AMBULATORY_CARE_PROVIDER_SITE_OTHER): Payer: 59

## 2023-05-16 ENCOUNTER — Ambulatory Visit: Payer: 59 | Admitting: Orthopedic Surgery

## 2023-05-16 DIAGNOSIS — M5416 Radiculopathy, lumbar region: Secondary | ICD-10-CM | POA: Diagnosis not present

## 2023-05-16 NOTE — Progress Notes (Addendum)
Orthopedic Spine Surgery Office Note   Assessment: Patient is a 52 y.o. female with chronic, worsening low back pain with pain rating into bilateral buttocks and the right lateral thigh. Has had a number of falls recently due to the pain     Plan: -Patient has tried tylenol, NSAIDs, physical therapy, steroid pills, steroid injections, pain management  -Patient has not had any relief of her back pain with conservative treatment over the last several months. She has also been falling and has developed weakness, so ordered an MRI of her lumbar spine to evaluate further -Still would recommend weight loss prior to surgery but if her weakness is progressive, I told her I still may recommend surgery and we would have to accept that her risk for complication would be higher -Patient should return to office in 4 weeks, x-rays at next visit: none     Patient expressed understanding of the plan and all questions were answered to the patient's satisfaction.    ___________________________________________________________________________   History: Patient is a 52 y.o. female who has been previously seen in the office for her lumbar spine. She comes in today with worsening of her low back pain. She feels it across the lower lumbar spine. She also has bilateral buttock pain. She is having pain that she describes as burning in her right lateral thigh as well. She does not have any pain radiating past the knee on either side. She has been having falls that started within the last six weeks. They have gotten more frequent within the last 2 weeks. Denies paresthesias and numbness. No saddle anesthesia. No bowel or bladder incontinence.   Previous treatments: tylenol, NSAIDs, physical therapy, steroid pills, steroid injections, pain management     Physical Exam:   General: no acute distress, appears stated age, came in with [employer] cup Neurologic: alert, answering questions appropriately, following  commands Respiratory: unlabored breathing on room air, symmetric chest rise Psychiatric: appropriate affect, normal cadence to speech     MSK (spine):   -Strength exam                                                   Left                  Right EHL                              5/5                  5/5 TA                                 5/5                  5/5 GSC                             5/5                  5/5 Knee extension            5/5                  5/5 Hip flexion  4/5                  4+/5   -Sensory exam                           Sensation intact to light touch in L3-S1 nerve distributions of bilateral lower extremities   -Straight leg raise: Negative bilaterally -Femoral nerve stretch test: Negative bilaterally -Clonus: no beats bilaterally -Negative romberg -2/4 biceps and brachioradialis DTRs bilaterally -1/4 patellar and achilles DTRs bilaterally -Negative hoffman bilaterally    Imaging: XR of the lumbar spine from 05/16/2023 was independently reviewed and interpreted, showing posterior instrumentation from L4-S1. The screws appear in appropriate position. There is no lucency around the screws and none of the screws have backed out. There are interbody devices in the L4/5 and L5/S1 former disc spaces that are in appropriate position. No lucency seen around the interbody devices. Disc height loss with grade 1 spondylolisthesis at L3/4. No fracture or dislocation seen.     Patient name: Marilyn [name] Patient MRN: [id number] Date of visit: 05/16/23

## 2023-05-28 DIAGNOSIS — R6889 Other general symptoms and signs: Secondary | ICD-10-CM | POA: Diagnosis not present

## 2023-05-28 DIAGNOSIS — D509 Iron deficiency anemia, unspecified: Secondary | ICD-10-CM | POA: Diagnosis not present

## 2023-05-29 ENCOUNTER — Other Ambulatory Visit: Payer: Self-pay | Admitting: Orthopedic Surgery

## 2023-05-29 DIAGNOSIS — M5410 Radiculopathy, site unspecified: Secondary | ICD-10-CM

## 2023-06-04 DIAGNOSIS — D509 Iron deficiency anemia, unspecified: Secondary | ICD-10-CM | POA: Diagnosis not present

## 2023-06-04 DIAGNOSIS — R6889 Other general symptoms and signs: Secondary | ICD-10-CM | POA: Diagnosis not present

## 2023-06-13 ENCOUNTER — Ambulatory Visit: Payer: 59 | Admitting: Orthopedic Surgery

## 2023-06-14 ENCOUNTER — Ambulatory Visit (HOSPITAL_COMMUNITY): Payer: Medicare HMO

## 2023-06-14 ENCOUNTER — Ambulatory Visit (HOSPITAL_COMMUNITY): Admission: RE | Admit: 2023-06-14 | Payer: Medicare HMO | Source: Ambulatory Visit

## 2023-06-14 ENCOUNTER — Other Ambulatory Visit (HOSPITAL_COMMUNITY): Payer: Medicare HMO

## 2023-06-17 DIAGNOSIS — M5416 Radiculopathy, lumbar region: Secondary | ICD-10-CM | POA: Diagnosis not present

## 2023-06-23 ENCOUNTER — Other Ambulatory Visit: Payer: 59

## 2023-07-03 ENCOUNTER — Ambulatory Visit: Payer: 59 | Admitting: Orthopedic Surgery

## 2023-08-14 ENCOUNTER — Ambulatory Visit: Payer: Medicare HMO | Admitting: Surgical

## 2023-08-14 ENCOUNTER — Other Ambulatory Visit: Payer: Self-pay

## 2023-08-14 ENCOUNTER — Encounter: Payer: Self-pay | Admitting: Surgical

## 2023-08-14 ENCOUNTER — Other Ambulatory Visit: Payer: Self-pay | Admitting: Radiology

## 2023-08-14 DIAGNOSIS — M25511 Pain in right shoulder: Secondary | ICD-10-CM | POA: Diagnosis not present

## 2023-08-14 DIAGNOSIS — M19011 Primary osteoarthritis, right shoulder: Secondary | ICD-10-CM

## 2023-08-14 DIAGNOSIS — G8929 Other chronic pain: Secondary | ICD-10-CM

## 2023-08-14 MED ORDER — LIDOCAINE HCL 1 % IJ SOLN
10.0000 mL | INTRAMUSCULAR | Status: AC | PRN
  Administered 2023-08-14: 10 mL

## 2023-08-14 MED ORDER — BUPIVACAINE HCL 0.25 % IJ SOLN
5.0000 mL | INTRAMUSCULAR | Status: AC | PRN
  Administered 2023-08-14: 5 mL via INTRA_ARTICULAR

## 2023-08-14 MED ORDER — ALLOPURINOL 100 MG PO TABS: 100.0000 mg | ORAL_TABLET | Freq: Two times a day (BID) | ORAL | 0 refills | Status: DC

## 2023-08-14 MED ORDER — METHYLPREDNISOLONE ACETATE 40 MG/ML IJ SUSP
40.0000 mg | INTRAMUSCULAR | Status: AC | PRN
  Administered 2023-08-14: 40 mg via INTRA_ARTICULAR

## 2023-08-14 NOTE — Progress Notes (Signed)
Follow-up Office Visit Note   Patient: Marilyn [name]           Date of Birth: Aug 18, 1971           MRN: [id number] Visit Date: 08/14/2023 Requested by: Rebecka Apley, NP [address] [place],  [place] [address] PCP: Rebecka Apley, NP  Subjective: Chief Complaint  Patient presents with   Right Shoulder - Pain    HPI: Marilyn [name] is a 52 y.o. female who returns to the office for follow-up visit.    Plan at last visit was: Right glenohumeral injection on 05/10/2023  Since then, patient notes she had partial relief of her shoulder pain for about 1.5 to 2 months.  Over the last month she has had recurrence of pain and it is more severe than it was prior to the last injection back in summer.  She has not had any recent injuries or falls.  No fevers or chills.  She has fairly constant pain mostly localizing to the anterior aspect of the shoulder.  Does not radiate down the arm past the elbow but does occasionally radiate into the bicep muscle.  No numbness or tingling.              ROS: All systems reviewed are negative as they relate to the chief complaint within the history of present illness.  Patient denies fevers or chills.  Assessment & Plan: Visit Diagnoses:  1. Chronic right shoulder pain     Plan: [name] is a 52 y.o. female who returns to the office for follow-up visit.  Plan from last visit was noted above in HPI.  They now return with right shoulder pain.  Pain is worse than it was at last visit though the glenohumeral injection did help for about 2 months.  She reports mostly anterior pain.  Could be pain from the arthritis or could be contribution of pain from severe biceps tendinosis of the long head of the bicep.  May have some synovitis blocking the injection from affecting the bicipital groove from the last injection.  After discussion of options, she would like to try injection again.  We administered 10 cc of lidocaine under  ultrasound guidance initially and this did help her anterior pain significantly so we proceeded with just a glenohumeral injection consisting of 5 cc Marcaine mixed with 1 cc Depo-Medrol.  We will see how this does for her and if her anterior pain does not really go away we could consider bicipital groove injection.  However based on her response to the lidocaine injection into her glenohumeral joint, think that the joint does communicate with the bicipital groove as it does normally so expect that this will help.  Follow-up as needed.  Follow-Up Instructions: Return if symptoms worsen or fail to improve.   Orders:  Orders Placed This Encounter  Procedures   US Guided Needle Placement - No Linked Charges   No orders of the defined types were placed in this encounter.     Procedures: Large Joint Inj: R glenohumeral on 08/14/2023 5:02 PM Details: 22 G 3.5 in needle, ultrasound-guided posterior approach Medications: 10 mL lidocaine 1 %; 5 mL bupivacaine 0.25 %; 40 mg methylPREDNISolone acetate 40 MG/ML Outcome: tolerated well, no immediate complications Procedure, treatment alternatives, risks and benefits explained, specific risks discussed. Consent was given by the patient. Patient was prepped and draped in the usual sterile fashion.       Clinical Data:  No additional findings.  Objective: Vital Signs: LMP  (LMP Unknown) Comment: tubal ligation  Physical Exam:  Constitutional: Patient appears well-developed HEENT:  Head: Normocephalic Eyes:EOM are normal Neck: Normal range of motion Cardiovascular: Normal rate Pulmonary/chest: Effort normal Neurologic: Patient is alert Skin: Skin is warm Psychiatric: Patient has normal mood and affect  Ortho Exam: Ortho exam demonstrates right shoulder with severely reduced passive and active range of motion.  Axillary nerve intact with deltoid firing.  No cellulitis or skin changes noted.  Tenderness over the anterior aspect the shoulder in  the bicipital groove.  Specialty Comments:  No specialty comments available.  Imaging: US Guided Needle Placement - No Linked Charges  Result Date: 08/14/2023 Ultrasound imaging demonstrates needle placement into the glenohumeral joint with extravasation of fluid and no complication.     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   Acute metabolic encephalopathy 03/31/2023   Lactic acidosis 12/18/2021   Dyspnea on exertion 12/18/2021   Anemia 12/18/2021   Gout 12/18/2021   Acute pain of left shoulder    Arthritis of left shoulder region    S/P reverse total shoulder arthroplasty, left 11/16/2021   Unilateral primary osteoarthritis, right hip 05/09/2021   S/P lumbar spinal fusion 05/09/2021   S/P insertion of spinal cord stimulator 05/09/2021   H/O total knee replacement, bilateral 01/18/2021   Lumbar post-laminectomy syndrome 04/06/2020   Long-term current use of opiate analgesic 11/04/2019   Leg pain, right 10/20/2019   Dysesthesia 10/20/2019   Lateral femoral cutaneous neuropathy, 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 difficile colitis 05/22/2019   Hypophosphatemia 05/21/2019   Syncope 05/20/2019   OSA on CPAP    Acute gastroenteritis    Anemia due to blood loss, acute 06/18/2018    Class: Acute   Plantar fasciitis of left foot 06/16/2018    Class: Chronic   Tendonitis, Achilles, right 06/16/2018    Class: Chronic   Osteochondral talar dome lesion 06/16/2018    Class: Chronic   Deep incisional surgical site infection    Superficial incisional surgical site infection 06/11/2018    Class: Acute   Right ankle effusion 06/11/2018    Class: Acute   Morbid (severe) obesity due to excess calories (HCC) 05/29/2018    Class: Chronic    Spondylolisthesis, lumbar region 05/27/2018    Class: Chronic   Spinal stenosis of lumbar region 05/27/2018    Class: Chronic   Urinary tract infection 05/27/2018    Class: Acute   Fusion of spine of lumbar region 05/27/2018   Pain of right hip joint 07/03/2017   Acute right-sided low back pain with right-sided sciatica 03/27/2017   Chronic right shoulder pain 02/13/2017   History of rotator cuff tear 11/30/2016   Impingement syndrome of right shoulder 11/30/2016   Exacerbation of asthma 07/18/2016   Hypokalemia 07/18/2016   Hypertension 07/18/2016   Depression with anxiety 07/18/2016   Hypothyroidism 07/18/2016   Hyperglycemia 07/18/2016   Asthma, chronic 07/18/2016   Surgical wound dehiscence left hip; questionable superficial infection 11/10/2015   Postoperative wound infection 11/10/2015   Osteoarthritis of left hip 09/09/2015   Status post total replacement of left hip 09/09/2015   Obesity (BMI 35.0-39.9 without comorbidity) 04/28/2013   Past Medical History:  Diagnosis Date   Anemia  taking iron now. pt states having no current issues 09/02/2015   Anginal pain (HCC)    pt states experiences chest wall pain pt states related to her asthma    Anxiety    with MRI's   Arthritis    Everywhere   Asthma    Depression    Dizziness    GERD (gastroesophageal reflux disease)    Headache(784.0)    HX OF MIGRAINES   History of bronchitis    Hypertension    Hypothyroidism    takes levothyroxen   OSA on CPAP    wears cpap   Shortness of breath    with exertion   Wears glasses     Family History  Problem Relation Age of Onset   Cancer Mother        colon   Epilepsy Mother    Cancer Father        prostate   Diabetes Father    Hypertension Father    Hypertension Maternal Aunt    Diabetes Maternal Aunt    Hypertension Paternal Aunt     Past Surgical History:  Procedure Laterality Date   APPLICATION OF WOUND VAC  11/16/2021   Procedure: APPLICATION OF WOUND VAC;   Surgeon: Cammy Copa, MD;  Location: [facility] OR;  Service: Orthopedics;;   APPLICATION OF WOUND VAC Left 12/01/2021   Procedure: APPLICATION OF WOUND VAC;  Surgeon: Cammy Copa, MD;  Location: [facility] OR;  Service: Orthopedics;  Laterality: Left;   BACK SURGERY     CESAREAN SECTION     times 2   CHOLECYSTECTOMY     COLONOSCOPY  2020   ENDOMETRIAL ABLATION     I & D EXTREMITY Left 12/06/2021   Procedure: LEFT SHOULDER DEBRIDEMENT AND WOUND CLOSURE;  Surgeon: [name], MD;  Location: [facility] OR;  Service: Orthopedics;  Laterality: Left;   I & D EXTREMITY Left 12/20/2021   Procedure: LEFT SHOULDER DEBRIDEMENT;  Surgeon: [name], MD;  Location: [facility] OR;  Service: Orthopedics;  Laterality: Left;   I & D EXTREMITY Left 12/22/2021   Procedure: LEFT SHOULDER DEBRIDEMENT;  Surgeon: [name], MD;  Location: [facility] OR;  Service: Orthopedics;  Laterality: Left;   INCISION AND DRAINAGE Left 12/01/2021   Procedure: LEFT SHOULDER IRRIGATION AND EXCISIONAL DEBRIDEMENT;  Surgeon: Cammy Copa, MD;  Location: [facility] OR;  Service: Orthopedics;  Laterality: Left;   INCISION AND DRAINAGE HIP Left 11/10/2015   Procedure: IRRIGATION AND DEBRIDEMENT LEFT HIP INCISION;  Surgeon: Kathryne Hitch, MD;  Location: [facility] OR;  Service: Orthopedics;  Laterality: Left;   JOINT REPLACEMENT  2011   total left knee   KNEE ARTHROPLASTY  04/23/2012   right    KNEE ARTHROSCOPY     LUMBAR LAMINECTOMY/DECOMPRESSION MICRODISCECTOMY N/A 06/23/2019   Procedure: RIGHT LUMBAR FIVE THROUGH SACRAL ONE PARTIAL HEMILAMINECTOMY WITH RIGHT LUMBAR FIVE FORAMINOTOMY;  Surgeon: Kerrin Champagne, MD;  Location: [facility] OR;  Service: Orthopedics;  Laterality: N/A;   LUMBAR WOUND DEBRIDEMENT N/A 06/11/2018   Procedure: LUMBAR WOUND DEBRIDEMENT;  Surgeon: Kerrin Champagne, MD;  Location: [facility] OR;  Service: Orthopedics;  Laterality: N/A;   RADIOLOGY WITH ANESTHESIA N/A 09/09/2018   Procedure: LUMBER SPINE WITHOUT CONTRAST;  Surgeon:  Radiologist, Medication, MD;  Location: [facility] OR;  Service: Radiology;  Laterality: N/A;   REVERSE SHOULDER ARTHROPLASTY Left 11/16/2021   Procedure: LEFT REVERSE SHOULDER ARTHROPLASTY;  Surgeon: Cammy Copa, MD;  Location: [facility] OR;  Service: Orthopedics;  Laterality: Left;  ROTATOR CUFF REPAIR     left    SHOULDER SURGERY     right to repair ligament tear   TOTAL HIP ARTHROPLASTY Left 09/09/2015   Procedure: LEFT TOTAL HIP ARTHROPLASTY ANTERIOR APPROACH;  Surgeon: Kathryne Hitch, MD;  Location: [facility] ORS;  Service: Orthopedics;  Laterality: Left;   TOTAL KNEE ARTHROPLASTY  04/23/2012   Procedure: TOTAL KNEE ARTHROPLASTY;  Surgeon: [name], MD;  Location: [facility] OR;  Service: Orthopedics;  Laterality: Right;  Right Total Knee Arthroplasty   TUBAL LIGATION  1996   Social History   Occupational History   Not on file  Tobacco Use   Smoking status: Former    Current packs/day: 0.00    Average packs/day: 0.5 packs/day for 4.0 years (2.0 ttl pk-yrs)    Types: Cigarettes    Start date: 09/18/1983    Quit date: 09/18/1987    Years since quitting: 35.9   Smokeless tobacco: Never  Vaping Use   Vaping status: Never Used  Substance and Sexual Activity   Alcohol use: No   Drug use: No   Sexual activity: Yes    Birth control/protection: Surgical

## 2023-08-19 ENCOUNTER — Telehealth: Payer: Self-pay | Admitting: Surgical

## 2023-08-19 NOTE — Telephone Encounter (Signed)
IC appt scheduled.  

## 2023-08-19 NOTE — Telephone Encounter (Signed)
Patient called triage and LVM; wanting an appointment for shoulder. Please call patient with appointment.  [phone]

## 2023-08-19 NOTE — Telephone Encounter (Signed)
I spoke with Marilyn [name] over the weekend.  Recommended that she wait until today or tomorrow to see if the injection will help.  Did help in the clinic and it can take up to about a week for the cortisone injection to take full effect.  She agreed with plan and she would contact the office if she has continued pain.

## 2023-08-30 ENCOUNTER — Encounter: Payer: Self-pay | Admitting: Orthopedic Surgery

## 2023-08-30 ENCOUNTER — Telehealth: Payer: Self-pay | Admitting: Orthopedic Surgery

## 2023-08-30 ENCOUNTER — Ambulatory Visit (INDEPENDENT_AMBULATORY_CARE_PROVIDER_SITE_OTHER): Payer: Medicare HMO | Admitting: Orthopedic Surgery

## 2023-08-30 DIAGNOSIS — M19011 Primary osteoarthritis, right shoulder: Secondary | ICD-10-CM

## 2023-08-30 DIAGNOSIS — G8929 Other chronic pain: Secondary | ICD-10-CM

## 2023-08-30 DIAGNOSIS — M25511 Pain in right shoulder: Secondary | ICD-10-CM | POA: Diagnosis not present

## 2023-08-30 NOTE — Telephone Encounter (Signed)
Marilyn [name] from lab called and said that she needed diagnosis codes as well as the lab codes. That was missing from hard script. CB#[id number]

## 2023-08-30 NOTE — Progress Notes (Signed)
Office Visit Note   Patient: Marilyn [name]           Date of Birth: 06-21-1971           MRN: [id number] Visit Date: 08/30/2023 Requested by: Rebecka Apley, NP [address] [place],  [place] [address] PCP: Rebecka Apley, NP  Subjective: Chief Complaint  Patient presents with   Right Shoulder - Pain    HPI: Marilyn [name] is a 52 y.o. female who presents to the office reporting severe right shoulder pain.  She had left reverse shoulder replacement which has given her good pain relief.  Function is adequate.  Pain relief has been good.  Her shoulder was complicated by delayed healing which Dr. Lajoyce Corners was able to achieve healing with using modern techniques.  Patient does use a cane in her right hand.  Mother and father at home.  Patient has multiple other medical problems..                ROS: All systems reviewed are negative as they relate to the chief complaint within the history of present illness.  Patient denies fevers or chills.  Assessment & Plan: Visit Diagnoses:  1. Chronic right shoulder pain   2. Glenohumeral arthritis, right     Plan: Impression is end-stage right shoulder arthritis the patient with morbid obesity and multiple other medical comorbidities.  She did well with the left shoulder replacement although incisional healing was delayed and required multiple other surgeries.  I think that would be a risk for the current right shoulder as well.  Nonetheless her pain is borderline intractable.  Does not appear to be cervical spine related.  Thin cut CT scan pending to evaluate bony anatomy for preop planning.  Also would like for her to have her albumin and vitamin D levels checked prior to surgery.  Follow-Up Instructions: No follow-ups on file.   Orders:  Orders Placed This Encounter  Procedures   CT SHOULDER RIGHT WO CONTRAST   No orders of the defined types were placed in this encounter.     Procedures: No procedures  performed   Clinical Data: No additional findings.  Objective: Vital Signs: LMP  (LMP Unknown) Comment: tubal ligation  Physical Exam:  Constitutional: Patient appears well-developed HEENT:  Head: Normocephalic Eyes:EOM are normal Neck: Normal range of motion Cardiovascular: Normal rate Pulmonary/chest: Effort normal Neurologic: Patient is alert Skin: Skin is warm Psychiatric: Patient has normal mood and affect  Ortho Exam: Ortho exam demonstrates functional deltoid on the right.  Motor or sensory function of the hand is intact.  Radial pulses intact.  Range of motion is pretty painful and does not really exceed 50 or 60 degrees of active abduction or forward flexion.  Coarse grinding is present with passive range of motion of the shoulder.  Specialty Comments:  No specialty comments available.  Imaging: No results found.   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   Acute metabolic encephalopathy 03/31/2023   Lactic acidosis 12/18/2021   Dyspnea on exertion 12/18/2021   Anemia 12/18/2021   Gout 12/18/2021   Acute pain of left shoulder    Arthritis of left shoulder region    S/P reverse total shoulder arthroplasty, left 11/16/2021   Unilateral primary osteoarthritis, right hip 05/09/2021   S/P lumbar spinal fusion 05/09/2021  S/P insertion of spinal cord stimulator 05/09/2021   H/O total knee replacement, bilateral 01/18/2021   Lumbar post-laminectomy syndrome 04/06/2020   Long-term current use of opiate analgesic 11/04/2019   Leg pain, right 10/20/2019   Dysesthesia 10/20/2019   Lateral femoral cutaneous neuropathy, 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 difficile colitis 05/22/2019   Hypophosphatemia 05/21/2019   Syncope 05/20/2019    OSA on CPAP    Acute gastroenteritis    Anemia due to blood loss, acute 06/18/2018    Class: Acute   Plantar fasciitis of left foot 06/16/2018    Class: Chronic   Tendonitis, Achilles, right 06/16/2018    Class: Chronic   Osteochondral talar dome lesion 06/16/2018    Class: Chronic   Deep incisional surgical site infection    Superficial incisional surgical site infection 06/11/2018    Class: Acute   Right ankle effusion 06/11/2018    Class: Acute   Morbid (severe) obesity due to excess calories (HCC) 05/29/2018    Class: Chronic   Spondylolisthesis, lumbar region 05/27/2018    Class: Chronic   Spinal stenosis of lumbar region 05/27/2018    Class: Chronic   Urinary tract infection 05/27/2018    Class: Acute   Fusion of spine of lumbar region 05/27/2018   Pain of right hip joint 07/03/2017   Acute right-sided low back pain with right-sided sciatica 03/27/2017   Chronic right shoulder pain 02/13/2017   History of rotator cuff tear 11/30/2016   Impingement syndrome of right shoulder 11/30/2016   Exacerbation of asthma 07/18/2016   Hypokalemia 07/18/2016   Hypertension 07/18/2016   Depression with anxiety 07/18/2016   Hypothyroidism 07/18/2016   Hyperglycemia 07/18/2016   Asthma, chronic 07/18/2016   Surgical wound dehiscence left hip; questionable superficial infection 11/10/2015   Postoperative wound infection 11/10/2015   Osteoarthritis of left hip 09/09/2015   Status post total replacement of left hip 09/09/2015   Obesity (BMI 35.0-39.9 without comorbidity) 04/28/2013   Past Medical History:  Diagnosis Date   Anemia    taking iron now. pt states having no current issues 09/02/2015   Anginal pain (HCC)    pt states experiences chest wall pain pt states related to her asthma    Anxiety    with MRI's   Arthritis    Everywhere   Asthma    Depression    Dizziness    GERD (gastroesophageal reflux disease)    Headache(784.0)    HX OF MIGRAINES   History of  bronchitis    Hypertension    Hypothyroidism    takes levothyroxen   OSA on CPAP    wears cpap   Shortness of breath    with exertion   Wears glasses     Family History  Problem Relation Age of Onset   Cancer Mother        colon   Epilepsy Mother    Cancer Father        prostate   Diabetes Father    Hypertension Father    Hypertension Maternal Aunt    Diabetes Maternal Aunt    Hypertension Paternal Aunt     Past Surgical History:  Procedure Laterality Date   APPLICATION OF WOUND VAC  11/16/2021   Procedure: APPLICATION OF WOUND VAC;  Surgeon: Cammy Copa, MD;  Location: [facility] OR;  Service: Orthopedics;;   APPLICATION OF WOUND VAC Left 12/01/2021   Procedure: APPLICATION OF  WOUND VAC;  Surgeon: Cammy Copa, MD;  Location: [facility] OR;  Service: Orthopedics;  Laterality: Left;   BACK SURGERY     CESAREAN SECTION     times 2   CHOLECYSTECTOMY     COLONOSCOPY  2020   ENDOMETRIAL ABLATION     I & D EXTREMITY Left 12/06/2021   Procedure: LEFT SHOULDER DEBRIDEMENT AND WOUND CLOSURE;  Surgeon: [name], MD;  Location: [facility] OR;  Service: Orthopedics;  Laterality: Left;   I & D EXTREMITY Left 12/20/2021   Procedure: LEFT SHOULDER DEBRIDEMENT;  Surgeon: [name], MD;  Location: [facility] OR;  Service: Orthopedics;  Laterality: Left;   I & D EXTREMITY Left 12/22/2021   Procedure: LEFT SHOULDER DEBRIDEMENT;  Surgeon: [name], MD;  Location: [facility] OR;  Service: Orthopedics;  Laterality: Left;   INCISION AND DRAINAGE Left 12/01/2021   Procedure: LEFT SHOULDER IRRIGATION AND EXCISIONAL DEBRIDEMENT;  Surgeon: Cammy Copa, MD;  Location: [facility] OR;  Service: Orthopedics;  Laterality: Left;   INCISION AND DRAINAGE HIP Left 11/10/2015   Procedure: IRRIGATION AND DEBRIDEMENT LEFT HIP INCISION;  Surgeon: Kathryne Hitch, MD;  Location: [facility] OR;  Service: Orthopedics;  Laterality: Left;   JOINT REPLACEMENT  2011   total left knee   KNEE ARTHROPLASTY  04/23/2012   right    KNEE  ARTHROSCOPY     LUMBAR LAMINECTOMY/DECOMPRESSION MICRODISCECTOMY N/A 06/23/2019   Procedure: RIGHT LUMBAR FIVE THROUGH SACRAL ONE PARTIAL HEMILAMINECTOMY WITH RIGHT LUMBAR FIVE FORAMINOTOMY;  Surgeon: Kerrin Champagne, MD;  Location: [facility] OR;  Service: Orthopedics;  Laterality: N/A;   LUMBAR WOUND DEBRIDEMENT N/A 06/11/2018   Procedure: LUMBAR WOUND DEBRIDEMENT;  Surgeon: Kerrin Champagne, MD;  Location: [facility] OR;  Service: Orthopedics;  Laterality: N/A;   RADIOLOGY WITH ANESTHESIA N/A 09/09/2018   Procedure: LUMBER SPINE WITHOUT CONTRAST;  Surgeon: Radiologist, Medication, MD;  Location: [facility] OR;  Service: Radiology;  Laterality: N/A;   REVERSE SHOULDER ARTHROPLASTY Left 11/16/2021   Procedure: LEFT REVERSE SHOULDER ARTHROPLASTY;  Surgeon: Cammy Copa, MD;  Location: [facility] OR;  Service: Orthopedics;  Laterality: Left;   ROTATOR CUFF REPAIR     left    SHOULDER SURGERY     right to repair ligament tear   TOTAL HIP ARTHROPLASTY Left 09/09/2015   Procedure: LEFT TOTAL HIP ARTHROPLASTY ANTERIOR APPROACH;  Surgeon: Kathryne Hitch, MD;  Location: [facility] OR[facility];  Service: Orthopedics;  Laterality: Left;   TOTAL KNEE ARTHROPLASTY  04/23/2012   Procedure: TOTAL KNEE ARTHROPLASTY;  Surgeon: [name], MD;  Location: [facility] OR;  Service: Orthopedics;  Laterality: Right;  Right Total Knee Arthroplasty   TUBAL LIGATION  1996   Social History   Occupational History   Not on file  Tobacco Use   Smoking status: Former    Current packs/day: 0.00    Average packs/day: 0.5 packs/day for 4.0 years (2.0 ttl pk-yrs)    Types: Cigarettes    Start date: 09/18/1983    Quit date: 09/18/1987    Years since quitting: 35.9   Smokeless tobacco: Never  Vaping Use   Vaping status: Never Used  Substance and Sexual Activity   Alcohol use: No   Drug use: No   Sexual activity: Yes    Birth control/protection: Surgical

## 2023-09-05 ENCOUNTER — Encounter: Payer: Self-pay | Admitting: Orthopedic Surgery

## 2023-09-08 ENCOUNTER — Encounter: Payer: Self-pay | Admitting: Orthopedic Surgery

## 2023-09-09 ENCOUNTER — Inpatient Hospital Stay
Admission: RE | Admit: 2023-09-09 | Discharge: 2023-09-09 | Discharge status: Home / Self Care | Payer: Medicare HMO | Source: Ambulatory Visit | Attending: Orthopedic Surgery | Admitting: Orthopedic Surgery

## 2023-09-09 DIAGNOSIS — G8929 Other chronic pain: Secondary | ICD-10-CM

## 2023-09-09 DIAGNOSIS — M19011 Primary osteoarthritis, right shoulder: Secondary | ICD-10-CM

## 2023-09-09 NOTE — Telephone Encounter (Signed)
Hi Debbie has this patient been scheduled yet?

## 2023-09-09 NOTE — Telephone Encounter (Signed)
CT is scheduled for today.

## 2023-09-23 ENCOUNTER — Ambulatory Visit (INDEPENDENT_AMBULATORY_CARE_PROVIDER_SITE_OTHER): Payer: Medicare HMO | Admitting: Surgical

## 2023-09-23 ENCOUNTER — Encounter: Payer: Self-pay | Admitting: Surgical

## 2023-09-23 DIAGNOSIS — M19011 Primary osteoarthritis, right shoulder: Secondary | ICD-10-CM

## 2023-09-23 DIAGNOSIS — G8929 Other chronic pain: Secondary | ICD-10-CM

## 2023-09-23 DIAGNOSIS — M25511 Pain in right shoulder: Secondary | ICD-10-CM

## 2023-09-23 NOTE — Progress Notes (Signed)
 Follow-up Office Visit Note   Patient: Marilyn [name]           Date of Birth: Feb 07, 1971           MRN: [id number] Visit Date: 09/23/2023 Requested by: Baird Comer GAILS, NP [address] [place],  [place] [address] PCP: Baird Comer GAILS, NP  Subjective: Chief Complaint  Patient presents with   Other     Review CT scan and discuss surgery    HPI: Marilyn [name] is a 53 y.o. female who returns to the office for follow-up visit.    Plan at last visit was: Impression is end-stage right shoulder arthritis the patient with morbid obesity and multiple other medical comorbidities. She did well with the left shoulder replacement although incisional healing was delayed and required multiple other surgeries. I think that would be a risk for the current right shoulder as well. Nonetheless her pain is borderline intractable. Does not appear to be cervical spine related. Thin cut CT scan pending to evaluate bony anatomy for preop planning. Also would like for her to have her albumin  and vitamin D  levels checked prior to surgery.   Since then, patient notes she has had CT scan of the right shoulder.  This was done on 09/09/2023.  She is ready to discuss surgery.  She notes worsening pain that is pretty much constant at this point.              ROS: All systems reviewed are negative as they relate to the chief complaint within the history of present illness.  Patient denies fevers or chills.  Assessment & Plan: Visit Diagnoses:  1. Glenohumeral arthritis, right   2. Chronic right shoulder pain     Plan: Marilyn [name] is a 53 y.o. female who returns to the office for follow-up visit.  Plan from last visit was noted above in HPI.  They now return with continued right shoulder pain.  She is at the point where she would like to proceed with surgery as she discussed with Dr. Addie at her last appointment.  She did have wound dehiscence requiring multiple I&D's with Dr.  Harden after her left reverse shoulder arthroplasty.  She understands that this is a significant risk with this surgery as well and specifically talked today about the risk of nerve/blood vessel damage, infection, wound dehiscence, need for revision surgery, amputation, heart attack/stroke/death/other medical problems.  She would still like to proceed.  She understands the recovery timeframe.  Will check her albumin  and vitamin D  at [facility] in the future.  We tried to draw her blood today but she was too dehydrated.  We will post her for surgery 3 months out from her last injection with the soonest surgical date of 11/14/2023.  Follow-up after procedure.  Follow-Up Instructions: No follow-ups on file.   Orders:  No orders of the defined types were placed in this encounter.  No orders of the defined types were placed in this encounter.     Procedures: No procedures performed   Clinical Data: No additional findings.  Objective: Vital Signs: LMP  (LMP Unknown) Comment: tubal ligation  Physical Exam:  Constitutional: Patient appears well-developed HEENT:  Head: Normocephalic Eyes:EOM are normal Neck: Normal range of motion Cardiovascular: Normal rate Pulmonary/chest: Effort normal Neurologic: Patient is alert Skin: Skin is warm Psychiatric: Patient has normal mood and affect  Ortho Exam: Ortho exam demonstrates right shoulder with about 50 degrees active abduction and forward flexion.  Plan of crepitus noted with passive motion of the shoulder.  Axillary nerve intact with deltoid firing.  Intact lateral sensation over the deltoid.  2+ radial pulse of the right upper extremity.  Intact EPL, FPL, finger abduction.  Specialty Comments:  No specialty comments available.  Imaging: No results found.   PMFS History: Patient Active Problem List   Diagnosis Date Noted   Acute renal failure superimposed on stage 3a chronic kidney disease (HCC) 03/31/2023   Emphysematous cystitis  03/31/2023   Acute metabolic encephalopathy 03/31/2023   Lactic acidosis 12/18/2021   Dyspnea on exertion 12/18/2021   Anemia 12/18/2021   Gout 12/18/2021   Acute pain of left shoulder    Arthritis of left shoulder region    S/P reverse total shoulder arthroplasty, left 11/16/2021   Unilateral primary osteoarthritis, right hip 05/09/2021   S/P lumbar spinal fusion 05/09/2021   S/P insertion of spinal cord stimulator 05/09/2021   H/O total knee replacement, bilateral 01/18/2021   Lumbar post-laminectomy syndrome 04/06/2020   Long-term current use of opiate analgesic 11/04/2019   Leg pain, right 10/20/2019   Dysesthesia 10/20/2019   Lateral femoral cutaneous neuropathy, 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 difficile colitis 05/22/2019   Hypophosphatemia 05/21/2019   Syncope 05/20/2019   OSA on CPAP    Acute gastroenteritis    Anemia due to blood loss, acute 06/18/2018    Class: Acute   Plantar fasciitis of left foot 06/16/2018    Class: Chronic   Tendonitis, Achilles, right 06/16/2018    Class: Chronic   Osteochondral talar dome lesion 06/16/2018    Class: Chronic   Deep incisional surgical site infection    Superficial incisional surgical site infection 06/11/2018    Class: Acute   Right ankle effusion 06/11/2018    Class: Acute   Morbid (severe) obesity due to excess calories (HCC) 05/29/2018    Class: Chronic   Spondylolisthesis, lumbar region 05/27/2018    Class: Chronic   Spinal stenosis of lumbar region 05/27/2018    Class: Chronic   Urinary tract infection 05/27/2018    Class: Acute   Fusion of spine of lumbar region 05/27/2018   Pain of right hip joint 07/03/2017   Acute right-sided low back pain with right-sided sciatica 03/27/2017   Chronic right shoulder pain 02/13/2017   History of rotator cuff tear 11/30/2016   Impingement syndrome of right shoulder 11/30/2016    Exacerbation of asthma 07/18/2016   Hypokalemia 07/18/2016   Hypertension 07/18/2016   Depression with anxiety 07/18/2016   Hypothyroidism 07/18/2016   Hyperglycemia 07/18/2016   Asthma, chronic 07/18/2016   Surgical wound dehiscence left hip; questionable superficial infection 11/10/2015   Postoperative wound infection 11/10/2015   Osteoarthritis of left hip 09/09/2015   Status post total replacement of left hip 09/09/2015   Obesity (BMI 35.0-39.9 without comorbidity) 04/28/2013   Past Medical History:  Diagnosis Date   Anemia    taking iron  now. pt states having no current issues 09/02/2015   Anginal pain (HCC)    pt states experiences chest wall pain pt states related to her asthma    Anxiety    with MRI's   Arthritis    Everywhere   Asthma    Depression    Dizziness    GERD (gastroesophageal reflux disease)    Headache(784.0)    HX OF MIGRAINES   History of bronchitis    Hypertension  Hypothyroidism    takes levothyroxen   OSA on CPAP    wears cpap   Shortness of breath    with exertion   Wears glasses     Family History  Problem Relation Age of Onset   Cancer Mother        colon   Epilepsy Mother    Cancer Father        prostate   Diabetes Father    Hypertension Father    Hypertension Maternal Aunt    Diabetes Maternal Aunt    Hypertension Paternal Aunt     Past Surgical History:  Procedure Laterality Date   APPLICATION OF WOUND VAC  11/16/2021   Procedure: APPLICATION OF WOUND VAC;  Surgeon: Addie Cordella Hamilton, MD;  Location: [facility] OR;  Service: Orthopedics;;   APPLICATION OF WOUND VAC Left 12/01/2021   Procedure: APPLICATION OF WOUND VAC;  Surgeon: Addie Cordella Hamilton, MD;  Location: [facility] OR;  Service: Orthopedics;  Laterality: Left;   BACK SURGERY     CESAREAN SECTION     times 2   CHOLECYSTECTOMY     COLONOSCOPY  2020   ENDOMETRIAL ABLATION     I & D EXTREMITY Left 12/06/2021   Procedure: LEFT SHOULDER DEBRIDEMENT AND WOUND CLOSURE;  Surgeon: Harden Jerona GAILS, MD;  Location: [facility] OR;  Service: Orthopedics;  Laterality: Left;   I & D EXTREMITY Left 12/20/2021   Procedure: LEFT SHOULDER DEBRIDEMENT;  Surgeon: Harden Jerona GAILS, MD;  Location: [facility] OR;  Service: Orthopedics;  Laterality: Left;   I & D EXTREMITY Left 12/22/2021   Procedure: LEFT SHOULDER DEBRIDEMENT;  Surgeon: Harden Jerona GAILS, MD;  Location: [facility] OR;  Service: Orthopedics;  Laterality: Left;   INCISION AND DRAINAGE Left 12/01/2021   Procedure: LEFT SHOULDER IRRIGATION AND EXCISIONAL DEBRIDEMENT;  Surgeon: Addie Cordella Hamilton, MD;  Location: [facility] OR;  Service: Orthopedics;  Laterality: Left;   INCISION AND DRAINAGE HIP Left 11/10/2015   Procedure: IRRIGATION AND DEBRIDEMENT LEFT HIP INCISION;  Surgeon: Lonni CINDERELLA Poli, MD;  Location: [facility] OR;  Service: Orthopedics;  Laterality: Left;   JOINT REPLACEMENT  2011   total left knee   KNEE ARTHROPLASTY  04/23/2012   right    KNEE ARTHROSCOPY     LUMBAR LAMINECTOMY/DECOMPRESSION MICRODISCECTOMY N/A 06/23/2019   Procedure: RIGHT LUMBAR FIVE THROUGH SACRAL ONE PARTIAL HEMILAMINECTOMY WITH RIGHT LUMBAR FIVE FORAMINOTOMY;  Surgeon: Lucilla Lynwood BRAVO, MD;  Location: [facility] OR;  Service: Orthopedics;  Laterality: N/A;   LUMBAR WOUND DEBRIDEMENT N/A 06/11/2018   Procedure: LUMBAR WOUND DEBRIDEMENT;  Surgeon: Lucilla Lynwood BRAVO, MD;  Location: [facility] OR;  Service: Orthopedics;  Laterality: N/A;   RADIOLOGY WITH ANESTHESIA N/A 09/09/2018   Procedure: LUMBER SPINE WITHOUT CONTRAST;  Surgeon: Radiologist, Medication, MD;  Location: [facility] OR;  Service: Radiology;  Laterality: N/A;   REVERSE SHOULDER ARTHROPLASTY Left 11/16/2021   Procedure: LEFT REVERSE SHOULDER ARTHROPLASTY;  Surgeon: Addie Cordella Hamilton, MD;  Location: [facility] OR;  Service: Orthopedics;  Laterality: Left;   ROTATOR CUFF REPAIR     left    SHOULDER SURGERY     right to repair ligament tear   TOTAL HIP ARTHROPLASTY Left 09/09/2015   Procedure: LEFT TOTAL HIP ARTHROPLASTY ANTERIOR APPROACH;  Surgeon: Lonni CINDERELLA Poli, MD;  Location: [facility];  Service: Orthopedics;  Laterality: Left;   TOTAL KNEE ARTHROPLASTY  04/23/2012   Procedure: TOTAL KNEE ARTHROPLASTY;  Surgeon: Jerona GAILS Harden, MD;  Location: [facility] OR;  Service: Orthopedics;  Laterality: Right;  Right Total Knee Arthroplasty   TUBAL LIGATION  1996   Social History   Occupational History   Not on file  Tobacco Use   Smoking status: Former    Current packs/day: 0.00    Average packs/day: 0.5 packs/day for 4.0 years (2.0 ttl pk-yrs)    Types: Cigarettes    Start date: 09/18/1983    Quit date: 09/18/1987    Years since quitting: 36.0   Smokeless tobacco: Never  Vaping Use   Vaping status: Never Used  Substance and Sexual Activity   Alcohol  use: No   Drug use: No   Sexual activity: Yes    Birth control/protection: Surgical

## 2023-09-30 ENCOUNTER — Encounter: Payer: Self-pay | Admitting: Orthopedic Surgery

## 2023-10-01 NOTE — Telephone Encounter (Signed)
 Sorry must wait b/c of all her other joints thx

## 2023-10-02 ENCOUNTER — Ambulatory Visit: Payer: Medicare HMO | Admitting: Orthopedic Surgery

## 2023-10-12 ENCOUNTER — Emergency Department (HOSPITAL_COMMUNITY): Payer: Medicare HMO

## 2023-10-12 ENCOUNTER — Inpatient Hospital Stay (HOSPITAL_COMMUNITY)
Admission: EM | Admit: 2023-10-12 | Discharge: 2023-10-16 | DRG: 683 | Disposition: A | Discharge status: Home Health | Payer: Medicare HMO | Attending: Internal Medicine | Admitting: Internal Medicine

## 2023-10-12 ENCOUNTER — Encounter (HOSPITAL_COMMUNITY): Payer: Self-pay

## 2023-10-12 ENCOUNTER — Other Ambulatory Visit: Payer: Self-pay

## 2023-10-12 DIAGNOSIS — Z833 Family history of diabetes mellitus: Secondary | ICD-10-CM

## 2023-10-12 DIAGNOSIS — E876 Hypokalemia: Secondary | ICD-10-CM | POA: Diagnosis present

## 2023-10-12 DIAGNOSIS — Z7951 Long term (current) use of inhaled steroids: Secondary | ICD-10-CM | POA: Diagnosis not present

## 2023-10-12 DIAGNOSIS — F40240 Claustrophobia: Secondary | ICD-10-CM | POA: Diagnosis present

## 2023-10-12 DIAGNOSIS — K219 Gastro-esophageal reflux disease without esophagitis: Secondary | ICD-10-CM | POA: Diagnosis present

## 2023-10-12 DIAGNOSIS — M159 Polyosteoarthritis, unspecified: Secondary | ICD-10-CM | POA: Diagnosis present

## 2023-10-12 DIAGNOSIS — R339 Retention of urine, unspecified: Secondary | ICD-10-CM | POA: Diagnosis present

## 2023-10-12 DIAGNOSIS — Z885 Allergy status to narcotic agent status: Secondary | ICD-10-CM

## 2023-10-12 DIAGNOSIS — Z981 Arthrodesis status: Secondary | ICD-10-CM

## 2023-10-12 DIAGNOSIS — I129 Hypertensive chronic kidney disease with stage 1 through stage 4 chronic kidney disease, or unspecified chronic kidney disease: Secondary | ICD-10-CM | POA: Diagnosis present

## 2023-10-12 DIAGNOSIS — M549 Dorsalgia, unspecified: Secondary | ICD-10-CM | POA: Diagnosis present

## 2023-10-12 DIAGNOSIS — Z6841 Body Mass Index (BMI) 40.0 and over, adult: Secondary | ICD-10-CM | POA: Diagnosis not present

## 2023-10-12 DIAGNOSIS — R159 Full incontinence of feces: Secondary | ICD-10-CM | POA: Diagnosis present

## 2023-10-12 DIAGNOSIS — Z888 Allergy status to other drugs, medicaments and biological substances status: Secondary | ICD-10-CM

## 2023-10-12 DIAGNOSIS — Z87891 Personal history of nicotine dependence: Secondary | ICD-10-CM

## 2023-10-12 DIAGNOSIS — E039 Hypothyroidism, unspecified: Secondary | ICD-10-CM | POA: Diagnosis present

## 2023-10-12 DIAGNOSIS — N179 Acute kidney failure, unspecified: Principal | ICD-10-CM | POA: Diagnosis present

## 2023-10-12 DIAGNOSIS — Z79891 Long term (current) use of opiate analgesic: Secondary | ICD-10-CM

## 2023-10-12 DIAGNOSIS — Z91040 Latex allergy status: Secondary | ICD-10-CM

## 2023-10-12 DIAGNOSIS — M069 Rheumatoid arthritis, unspecified: Secondary | ICD-10-CM | POA: Diagnosis present

## 2023-10-12 DIAGNOSIS — D631 Anemia in chronic kidney disease: Secondary | ICD-10-CM | POA: Diagnosis present

## 2023-10-12 DIAGNOSIS — G894 Chronic pain syndrome: Secondary | ICD-10-CM | POA: Diagnosis present

## 2023-10-12 DIAGNOSIS — G4733 Obstructive sleep apnea (adult) (pediatric): Secondary | ICD-10-CM | POA: Diagnosis present

## 2023-10-12 DIAGNOSIS — R338 Other retention of urine: Secondary | ICD-10-CM | POA: Insufficient documentation

## 2023-10-12 DIAGNOSIS — Z96653 Presence of artificial knee joint, bilateral: Secondary | ICD-10-CM | POA: Diagnosis present

## 2023-10-12 DIAGNOSIS — J45909 Unspecified asthma, uncomplicated: Secondary | ICD-10-CM | POA: Diagnosis present

## 2023-10-12 DIAGNOSIS — N1831 Chronic kidney disease, stage 3a: Secondary | ICD-10-CM | POA: Diagnosis present

## 2023-10-12 DIAGNOSIS — Z9682 Presence of neurostimulator: Secondary | ICD-10-CM

## 2023-10-12 DIAGNOSIS — Z91018 Allergy to other foods: Secondary | ICD-10-CM

## 2023-10-12 DIAGNOSIS — Z79890 Hormone replacement therapy (postmenopausal): Secondary | ICD-10-CM

## 2023-10-12 DIAGNOSIS — Z8249 Family history of ischemic heart disease and other diseases of the circulatory system: Secondary | ICD-10-CM | POA: Diagnosis not present

## 2023-10-12 DIAGNOSIS — Z91030 Bee allergy status: Secondary | ICD-10-CM

## 2023-10-12 DIAGNOSIS — Z96612 Presence of left artificial shoulder joint: Secondary | ICD-10-CM | POA: Diagnosis present

## 2023-10-12 DIAGNOSIS — Z79899 Other long term (current) drug therapy: Secondary | ICD-10-CM

## 2023-10-12 DIAGNOSIS — Z96642 Presence of left artificial hip joint: Secondary | ICD-10-CM | POA: Diagnosis present

## 2023-10-12 DIAGNOSIS — M545 Low back pain, unspecified: Secondary | ICD-10-CM | POA: Diagnosis not present

## 2023-10-12 DIAGNOSIS — G9529 Other cord compression: Secondary | ICD-10-CM | POA: Diagnosis present

## 2023-10-12 DIAGNOSIS — W010XXA Fall on same level from slipping, tripping and stumbling without subsequent striking against object, initial encounter: Secondary | ICD-10-CM | POA: Diagnosis present

## 2023-10-12 DIAGNOSIS — I10 Essential (primary) hypertension: Secondary | ICD-10-CM | POA: Diagnosis present

## 2023-10-12 DIAGNOSIS — Z9049 Acquired absence of other specified parts of digestive tract: Secondary | ICD-10-CM

## 2023-10-12 LAB — MAGNESIUM: Magnesium: 2.2 mg/dL (ref 1.7–2.4)

## 2023-10-12 LAB — URINALYSIS, ROUTINE W REFLEX MICROSCOPIC
Bilirubin Urine: NEGATIVE
Glucose, UA: NEGATIVE mg/dL
Hgb urine dipstick: NEGATIVE
Ketones, ur: NEGATIVE mg/dL
Nitrite: NEGATIVE
Protein, ur: NEGATIVE mg/dL
Specific Gravity, Urine: 1.012 (ref 1.005–1.030)
pH: 5 (ref 5.0–8.0)

## 2023-10-12 LAB — CBC WITH DIFFERENTIAL/PLATELET
Abs Immature Granulocytes: 0.01 10*3/uL (ref 0.00–0.07)
Basophils Absolute: 0 10*3/uL (ref 0.0–0.1)
Basophils Relative: 0 %
Eosinophils Absolute: 0.5 10*3/uL (ref 0.0–0.5)
Eosinophils Relative: 6 %
HCT: 32.6 % — ABNORMAL LOW (ref 36.0–46.0)
Hemoglobin: 10.3 g/dL — ABNORMAL LOW (ref 12.0–15.0)
Immature Granulocytes: 0 %
Lymphocytes Relative: 16 %
Lymphs Abs: 1.3 10*3/uL (ref 0.7–4.0)
MCH: 28.9 pg (ref 26.0–34.0)
MCHC: 31.6 g/dL (ref 30.0–36.0)
MCV: 91.3 fL (ref 80.0–100.0)
Monocytes Absolute: 0.6 10*3/uL (ref 0.1–1.0)
Monocytes Relative: 7 %
Neutro Abs: 6.3 10*3/uL (ref 1.7–7.7)
Neutrophils Relative %: 71 %
Platelets: 200 10*3/uL (ref 150–400)
RBC: 3.57 MIL/uL — ABNORMAL LOW (ref 3.87–5.11)
RDW: 14.1 % (ref 11.5–15.5)
WBC: 8.7 10*3/uL (ref 4.0–10.5)
nRBC: 0 % (ref 0.0–0.2)

## 2023-10-12 LAB — COMPREHENSIVE METABOLIC PANEL
ALT: 19 U/L (ref 0–44)
AST: 37 U/L (ref 15–41)
Albumin: 3.6 g/dL (ref 3.5–5.0)
Alkaline Phosphatase: 58 U/L (ref 38–126)
Anion gap: 16 — ABNORMAL HIGH (ref 5–15)
BUN: 51 mg/dL — ABNORMAL HIGH (ref 6–20)
CO2: 24 mmol/L (ref 22–32)
Calcium: 9.7 mg/dL (ref 8.9–10.3)
Chloride: 98 mmol/L (ref 98–111)
Creatinine, Ser: 2.99 mg/dL — ABNORMAL HIGH (ref 0.44–1.00)
GFR, Estimated: 18 mL/min — ABNORMAL LOW (ref >60)
Glucose, Bld: 88 mg/dL (ref 70–99)
Potassium: 2.6 mmol/L — CL (ref 3.5–5.1)
Sodium: 138 mmol/L (ref 135–145)
Total Bilirubin: 0.7 mg/dL (ref 0.0–1.2)
Total Protein: 7.4 g/dL (ref 6.5–8.1)

## 2023-10-12 MED ORDER — CYCLOBENZAPRINE HCL 10 MG PO TABS
10.0000 mg | ORAL_TABLET | Freq: Three times a day (TID) | ORAL | Status: DC | PRN
  Administered 2023-10-15: 10 mg via ORAL
  Filled 2023-10-12: qty 1

## 2023-10-12 MED ORDER — PANTOPRAZOLE SODIUM 40 MG PO TBEC
40.0000 mg | DELAYED_RELEASE_TABLET | Freq: Every day | ORAL | Status: DC
  Administered 2023-10-13 – 2023-10-16 (×4): 40 mg via ORAL
  Filled 2023-10-12 (×4): qty 1

## 2023-10-12 MED ORDER — DULOXETINE HCL 60 MG PO CPEP
60.0000 mg | ORAL_CAPSULE | Freq: Two times a day (BID) | ORAL | Status: DC
Start: 2023-10-13 — End: 2023-10-16
  Administered 2023-10-12 – 2023-10-16 (×8): 60 mg via ORAL
  Filled 2023-10-12 (×8): qty 1

## 2023-10-12 MED ORDER — FENTANYL CITRATE PF 50 MCG/ML IJ SOSY
50.0000 ug | PREFILLED_SYRINGE | Freq: Once | INTRAMUSCULAR | Status: AC
  Administered 2023-10-12: 50 ug via INTRAVENOUS
  Filled 2023-10-12: qty 1

## 2023-10-12 MED ORDER — LACTATED RINGERS IV BOLUS
1000.0000 mL | Freq: Once | INTRAVENOUS | Status: AC
  Administered 2023-10-12: 1000 mL via INTRAVENOUS

## 2023-10-12 MED ORDER — BUDESONIDE 0.5 MG/2ML IN SUSP
0.5000 mg | Freq: Every day | RESPIRATORY_TRACT | Status: DC
  Administered 2023-10-13 – 2023-10-16 (×5): 0.5 mg via RESPIRATORY_TRACT
  Filled 2023-10-12 (×4): qty 2

## 2023-10-12 MED ORDER — CYCLOSPORINE 0.05 % OP EMUL
1.0000 [drp] | Freq: Two times a day (BID) | OPHTHALMIC | Status: DC | PRN
Start: 2023-10-12 — End: 2023-10-16

## 2023-10-12 MED ORDER — ORAL CARE MOUTH RINSE: 15.0000 mL | OROMUCOSAL | Status: DC | PRN

## 2023-10-12 MED ORDER — OXYCODONE HCL 5 MG PO TABS
5.0000 mg | ORAL_TABLET | Freq: Once | ORAL | Status: AC
  Administered 2023-10-12: 5 mg via ORAL
  Filled 2023-10-12: qty 1

## 2023-10-12 MED ORDER — POTASSIUM CHLORIDE 10 MEQ/100ML IV SOLN
10.0000 meq | INTRAVENOUS | Status: AC
  Administered 2023-10-12 (×3): 10 meq via INTRAVENOUS
  Filled 2023-10-12 (×2): qty 100

## 2023-10-12 MED ORDER — IPRATROPIUM-ALBUTEROL 0.5-2.5 (3) MG/3ML IN SOLN: 3.0000 mL | RESPIRATORY_TRACT | Status: DC | PRN

## 2023-10-12 MED ORDER — ONDANSETRON HCL 4 MG PO TABS: 4.0000 mg | ORAL_TABLET | Freq: Four times a day (QID) | ORAL | Status: DC | PRN

## 2023-10-12 MED ORDER — ENOXAPARIN SODIUM 40 MG/0.4ML IJ SOSY: 40.0000 mg | PREFILLED_SYRINGE | Freq: Every day | INTRAMUSCULAR | Status: DC

## 2023-10-12 MED ORDER — PREGABALIN 75 MG PO CAPS
75.0000 mg | ORAL_CAPSULE | Freq: Two times a day (BID) | ORAL | Status: DC
  Administered 2023-10-12 – 2023-10-16 (×8): 75 mg via ORAL
  Filled 2023-10-12 (×8): qty 1

## 2023-10-12 MED ORDER — LIDOCAINE 5 % EX PTCH
1.0000 | MEDICATED_PATCH | CUTANEOUS | Status: DC
  Administered 2023-10-12 – 2023-10-16 (×4): 1 via TRANSDERMAL
  Filled 2023-10-12 (×5): qty 1

## 2023-10-12 MED ORDER — POTASSIUM CHLORIDE 10 MEQ/100ML IV SOLN
10.0000 meq | Freq: Once | INTRAVENOUS | Status: AC
  Administered 2023-10-12: 10 meq via INTRAVENOUS
  Filled 2023-10-12: qty 100

## 2023-10-12 MED ORDER — ONDANSETRON HCL 4 MG/2ML IJ SOLN: 4.0000 mg | Freq: Four times a day (QID) | INTRAMUSCULAR | Status: DC | PRN

## 2023-10-12 MED ORDER — HYDROCODONE-ACETAMINOPHEN 5-325 MG PO TABS
1.0000 | ORAL_TABLET | Freq: Three times a day (TID) | ORAL | Status: DC | PRN
  Administered 2023-10-15 – 2023-10-16 (×2): 1 via ORAL
  Filled 2023-10-12 (×2): qty 1

## 2023-10-12 MED ORDER — HYDROMORPHONE HCL 1 MG/ML IJ SOLN: 0.5000 mg | INTRAMUSCULAR | Status: DC | PRN

## 2023-10-12 MED ORDER — LEVOTHYROXINE SODIUM 75 MCG PO TABS
150.0000 ug | ORAL_TABLET | Freq: Every day | ORAL | Status: DC
  Administered 2023-10-13 – 2023-10-16 (×4): 150 ug via ORAL
  Filled 2023-10-12 (×4): qty 2

## 2023-10-12 MED ORDER — POTASSIUM CHLORIDE CRYS ER 20 MEQ PO TBCR
40.0000 meq | EXTENDED_RELEASE_TABLET | Freq: Two times a day (BID) | ORAL | Status: DC
  Administered 2023-10-12 – 2023-10-13 (×2): 40 meq via ORAL
  Filled 2023-10-12 (×2): qty 2

## 2023-10-12 MED ORDER — ACETAMINOPHEN 500 MG PO TABS
1000.0000 mg | ORAL_TABLET | Freq: Once | ORAL | Status: AC
  Administered 2023-10-12: 1000 mg via ORAL
  Filled 2023-10-12: qty 2

## 2023-10-12 MED ORDER — BUPROPION HCL ER (XL) 150 MG PO TB24
450.0000 mg | ORAL_TABLET | Freq: Every day | ORAL | Status: DC
Start: 2023-10-13 — End: 2023-10-16
  Administered 2023-10-13 – 2023-10-16 (×4): 450 mg via ORAL
  Filled 2023-10-12 (×4): qty 3

## 2023-10-12 NOTE — ED Notes (Signed)
Walked with pt to restroom. Pt's pull up was covered in feces and pt stated she felt like she had to urinate but was unable to produce any. Walked with pt back to room with hospital walker, as pt states she uses one at home.

## 2023-10-12 NOTE — H&P (Signed)
History and Physical    Patient: Marilyn [name]:[id number] DOB: 01-17-1971 DOA: 10/12/2023 DOS: the patient was seen and examined on 10/12/2023 PCP: Rebecka Apley, NP  Patient coming from: Home  Chief Complaint:  Chief Complaint  Patient presents with   Back Pain   HPI: Marilyn [name] is a 53 y.o. female with medical history significant of asthma, anxiety, GERD, morbid obesity, hypertension, obstructive sleep apnea, hypothyroidism, chronic pain, status post lumbar laminectomy and lumbar fusion, osteoarthritis.  Patient was trying to get out of bed this morning and had a fall.  She slipped and fell onto her buttocks.  She denies head injury or loss of consciousness.  She has been having back pain since then with some of the pain radiating down into her lower legs bilaterally.  No numbness or tingling or saddle paresthesias.  She came in for evaluation.  Here, routine blood work showed an AKI with hypokalemia.  CT of the pelvis was obtained showing urinary retention.  A Foley catheter was placed and she had over a liter of urine that was immediately drained.  Review of Systems: As mentioned in the history of present illness. All other systems reviewed and are negative. Past Medical History:  Diagnosis Date   Anemia    taking iron now. pt states having no current issues 09/02/2015   Anginal pain (HCC)    pt states experiences chest wall pain pt states related to her asthma    Anxiety    with MRI's   Arthritis    Everywhere   Asthma    Depression    Dizziness    GERD (gastroesophageal reflux disease)    Headache(784.0)    HX OF MIGRAINES   History of bronchitis    Hypertension    Hypothyroidism    takes levothyroxen   OSA on CPAP    wears cpap   Shortness of breath    with exertion   Wears glasses    Past Surgical History:  Procedure Laterality Date   APPLICATION OF WOUND VAC  11/16/2021   Procedure: APPLICATION OF WOUND VAC;  Surgeon: Cammy Copa, MD;   Location: [facility] OR;  Service: Orthopedics;;   APPLICATION OF WOUND VAC Left 12/01/2021   Procedure: APPLICATION OF WOUND VAC;  Surgeon: Cammy Copa, MD;  Location: [facility] OR;  Service: Orthopedics;  Laterality: Left;   BACK SURGERY     CESAREAN SECTION     times 2   CHOLECYSTECTOMY     COLONOSCOPY  2020   ENDOMETRIAL ABLATION     I & D EXTREMITY Left 12/06/2021   Procedure: LEFT SHOULDER DEBRIDEMENT AND WOUND CLOSURE;  Surgeon: [name], MD;  Location: [facility] OR;  Service: Orthopedics;  Laterality: Left;   I & D EXTREMITY Left 12/20/2021   Procedure: LEFT SHOULDER DEBRIDEMENT;  Surgeon: [name], MD;  Location: [facility] OR;  Service: Orthopedics;  Laterality: Left;   I & D EXTREMITY Left 12/22/2021   Procedure: LEFT SHOULDER DEBRIDEMENT;  Surgeon: [name], MD;  Location: [facility] OR;  Service: Orthopedics;  Laterality: Left;   INCISION AND DRAINAGE Left 12/01/2021   Procedure: LEFT SHOULDER IRRIGATION AND EXCISIONAL DEBRIDEMENT;  Surgeon: Cammy Copa, MD;  Location: [facility] OR;  Service: Orthopedics;  Laterality: Left;   INCISION AND DRAINAGE HIP Left 11/10/2015   Procedure: IRRIGATION AND DEBRIDEMENT LEFT HIP INCISION;  Surgeon: Kathryne Hitch, MD;  Location: [facility] OR;  Service: Orthopedics;  Laterality: Left;   JOINT REPLACEMENT  2011   total left knee   KNEE ARTHROPLASTY  04/23/2012   right    KNEE ARTHROSCOPY     LUMBAR LAMINECTOMY/DECOMPRESSION MICRODISCECTOMY N/A 06/23/2019   Procedure: RIGHT LUMBAR FIVE THROUGH SACRAL ONE PARTIAL HEMILAMINECTOMY WITH RIGHT LUMBAR FIVE FORAMINOTOMY;  Surgeon: Kerrin Champagne, MD;  Location: [facility] OR;  Service: Orthopedics;  Laterality: N/A;   LUMBAR WOUND DEBRIDEMENT N/A 06/11/2018   Procedure: LUMBAR WOUND DEBRIDEMENT;  Surgeon: Kerrin Champagne, MD;  Location: [facility] OR;  Service: Orthopedics;  Laterality: N/A;   RADIOLOGY WITH ANESTHESIA N/A 09/09/2018   Procedure: LUMBER SPINE WITHOUT CONTRAST;  Surgeon: Radiologist, Medication, MD;   Location: [facility] OR;  Service: Radiology;  Laterality: N/A;   REVERSE SHOULDER ARTHROPLASTY Left 11/16/2021   Procedure: LEFT REVERSE SHOULDER ARTHROPLASTY;  Surgeon: Cammy Copa, MD;  Location: [facility] OR;  Service: Orthopedics;  Laterality: Left;   ROTATOR CUFF REPAIR     left    SHOULDER SURGERY     right to repair ligament tear   SPINAL CORD STIMULATOR IMPLANT  08/02/2020   [facility]   TOTAL HIP ARTHROPLASTY Left 09/09/2015   Procedure: LEFT TOTAL HIP ARTHROPLASTY ANTERIOR APPROACH;  Surgeon: Kathryne Hitch, MD;  Location: [facility] OR[facility];  Service: Orthopedics;  Laterality: Left;   TOTAL KNEE ARTHROPLASTY  04/23/2012   Procedure: TOTAL KNEE ARTHROPLASTY;  Surgeon: [name], MD;  Location: [facility] OR;  Service: Orthopedics;  Laterality: Right;  Right Total Knee Arthroplasty   TUBAL LIGATION  1996   Social History:  reports that she quit smoking about 36 years ago. Her smoking use included cigarettes. She started smoking about 40 years ago. She has a 2 pack-year smoking history. She has never used smokeless tobacco. She reports that she does not drink alcohol and does not use drugs.  Allergies  Allergen Reactions   Lisinopril Anaphylaxis    Angioedema   Bee Venom Swelling and Other (See Comments)    Swelling at the site   Bupropion Swelling   Latex Itching and Rash   Propoxyphene Hives   Methadone Other (See Comments)    unknown   Codeine Nausea Only   Meloxicam Other (See Comments)    Insomnia, constipation   Morphine And Codeine Rash   Tegaderm Ag Mesh [Silver] Hives, Itching and Rash   Tomato Rash    Family History  Problem Relation Age of Onset   Cancer Mother        colon   Epilepsy Mother    Cancer Father        prostate   Diabetes Father    Hypertension Father    Hypertension Maternal Aunt    Diabetes Maternal Aunt    Hypertension Paternal Aunt     Prior to Admission medications   Medication Sig Start Date End Date Taking? Authorizing Provider  albuterol  (VENTOLIN HFA) 108 (90 Base) MCG/ACT inhaler Inhale 1-2 puffs into the lungs every 6 (six) hours as needed for wheezing or shortness of breath. 12/09/19   Henderly, Britni A, PA-C  allopurinol (ZYLOPRIM) 100 MG tablet Take 1 tablet (100 mg total) by mouth 2 (two) times daily. 08/14/23 11/12/23  Cammy Copa, MD  ascorbic Acid (VITAMIN C) 500 MG CPCR Take 500 mg by mouth daily. Gummy    [provider]  budesonide (PULMICORT) 0.5 MG/2ML nebulizer solution Take by nebulization. 01/15/23   [provider]  buPROPion (WELLBUTRIN XL) 150 MG 24 hr tablet Take 150 mg by mouth 2 (two) times daily. Take  with 300 mg to =450 mg 06/21/22 07/16/23  [provider]  cyclobenzaprine (FLEXERIL) 10 MG tablet Take 1 tablet by mouth 3 (three) times daily as needed. 01/28/23   [provider]  cycloSPORINE (RESTASIS) 0.05 % ophthalmic emulsion Place 1 drop into both eyes 2 (two) times daily as needed (dry eyes).    [provider]  DULoxetine (CYMBALTA) 60 MG capsule Take 60 mg by mouth 2 (two) times daily. 05/02/21   [provider]  Ferrous Gluconate 324 (37.5 Fe) MG TABS Take 324 mg by mouth daily. 02/14/20   [provider]  furosemide (LASIX) 20 MG tablet Take 20 mg by mouth daily.    [provider]  HYDROcodone-acetaminophen (NORCO/VICODIN) 5-325 MG tablet Take 1 tablet by mouth every 8 (eight) hours as needed. 01/20/23   [provider]  ipratropium-albuterol (DUONEB) 0.5-2.5 (3) MG/3ML SOLN Inhale into the lungs. 02/15/23   [provider]  levofloxacin (LEVAQUIN) 500 MG tablet Take 1 tablet (500 mg total) by mouth daily. 04/08/23   Dione Booze, MD  levothyroxine (SYNTHROID) 150 MCG tablet Take 150 mcg by mouth daily before breakfast. 05/18/19   [provider]  naloxone [employer] [facility] [employer]) nasal spray 4 mg/0.1 mL Place into the nose. 08/21/22   [provider]  nystatin (MYCOSTATIN/NYSTOP) powder Apply 1 Application  topically 3 (three) times daily. 11/06/22   [provider]  omeprazole (PRILOSEC) 40 MG capsule Take 40 mg by mouth 2 (two) times daily.    [provider]  oxybutynin (DITROPAN XL) 15 MG 24 hr tablet Take 15 mg by mouth daily.  05/31/17   [provider]  pregabalin (LYRICA) 75 MG capsule Take 1 capsule (75 mg total) by mouth 2 (two) times daily. 08/07/22   London Sheer, MD  vitamin B-12 (CYANOCOBALAMIN) 250 MCG tablet Take 250 mcg by mouth daily.    [provider]    Physical Exam: Vitals:   10/12/23 1345 10/12/23 1400 10/12/23 1430 10/12/23 1500  BP: 124/61 110/61 125/84   Pulse: 74 74 82   Resp:      Temp:    (!) 97.2 F (36.2 C)  TempSrc:    Rectal  SpO2: 96% 98% 96%   Weight:      Height:       General: Middle-age female. Awake and alert and oriented x3. No acute cardiopulmonary distress.  HEENT: Normocephalic atraumatic.  Right and left ears normal in appearance.  Pupils equal, round, reactive to light. Extraocular muscles are intact. Sclerae anicteric and noninjected.  Moist mucosal membranes. No mucosal lesions.  Neck: Neck supple without lymphadenopathy. No carotid bruits. No masses palpated.  Cardiovascular: Regular rate with normal S1-S2 sounds. No murmurs, rubs, gallops auscultated. No JVD.  Respiratory: Good respiratory effort with no wheezes, rales, rhonchi. Lungs clear to auscultation bilaterally.  No accessory muscle use. Abdomen: Overly obese.  Soft, nontender, nondistended. Active bowel sounds. No masses or hepatosplenomegaly  Skin: No rashes, lesions, or ulcerations.  Dry, warm to touch. 2+ dorsalis pedis and radial pulses. Musculoskeletal: No calf or leg pain. All major joints not erythematous nontender.  No upper or lower joint deformation.  Good ROM.  No contractures  Psychiatric: Intact judgment and insight. Pleasant and cooperative. Neurologic: No focal neurological deficits. Strength is 5/5 and symmetric in upper and lower  extremities.    Data Reviewed: Results for orders placed or performed during the hospital encounter of 10/12/23 (from the past 24 hours)  CBC with Differential  Status: Abnormal   Collection Time: 10/12/23 12:44 PM  Result Value Ref Range   WBC 8.7 4.0 - 10.5 K/uL   RBC 3.57 (L) 3.87 - 5.11 MIL/uL   Hemoglobin 10.3 (L) 12.0 - 15.0 g/dL   HCT 16.1 (L) 09.6 - 04.5 %   MCV 91.3 80.0 - 100.0 fL   MCH 28.9 26.0 - 34.0 pg   MCHC 31.6 30.0 - 36.0 g/dL   RDW 40.9 81.1 - 91.4 %   Platelets 200 150 - 400 K/uL   nRBC 0.0 0.0 - 0.2 %   Neutrophils Relative % 71 %   Neutro Abs 6.3 1.7 - 7.7 K/uL   Lymphocytes Relative 16 %   Lymphs Abs 1.3 0.7 - 4.0 K/uL   Monocytes Relative 7 %   Monocytes Absolute 0.6 0.1 - 1.0 K/uL   Eosinophils Relative 6 %   Eosinophils Absolute 0.5 0.0 - 0.5 K/uL   Basophils Relative 0 %   Basophils Absolute 0.0 0.0 - 0.1 K/uL   Immature Granulocytes 0 %   Abs Immature Granulocytes 0.01 0.00 - 0.07 K/uL  Comprehensive metabolic panel     Status: Abnormal   Collection Time: 10/12/23 12:44 PM  Result Value Ref Range   Sodium 138 135 - 145 mmol/L   Potassium 2.6 (LL) 3.5 - 5.1 mmol/L   Chloride 98 98 - 111 mmol/L   CO2 24 22 - 32 mmol/L   Glucose, Bld 88 70 - 99 mg/dL   BUN 51 (H) 6 - 20 mg/dL   Creatinine, Ser 7.82 (H) 0.44 - 1.00 mg/dL   Calcium 9.7 8.9 - 95.6 mg/dL   Total Protein 7.4 6.5 - 8.1 g/dL   Albumin 3.6 3.5 - 5.0 g/dL   AST 37 15 - 41 U/L   ALT 19 0 - 44 U/L   Alkaline Phosphatase 58 38 - 126 U/L   Total Bilirubin 0.7 0.0 - 1.2 mg/dL   GFR, Estimated 18 (L) >60 mL/min   Anion gap 16 (H) 5 - 15  Magnesium     Status: None   Collection Time: 10/12/23 12:44 PM  Result Value Ref Range   Magnesium 2.2 1.7 - 2.4 mg/dL   *Note: Due to a large number of results and/or encounters for the requested time period, some results have not been displayed. A complete set of results can be found in Results Review.    CT Lumbar Spine Wo Contrast Result  Date: 10/12/2023 CLINICAL DATA:  53 year old female status post fall. Back pain. Radiculopathy. Prior surgery. EXAM: CT LUMBAR SPINE WITHOUT CONTRAST TECHNIQUE: Multidetector CT imaging of the lumbar spine was performed without intravenous contrast administration. Multiplanar CT image reconstructions were also generated. RADIATION DOSE REDUCTION: This exam was performed according to the departmental dose-optimization program which includes automated exposure control, adjustment of the mA and/or kV according to patient size and/or use of iterative reconstruction technique. COMPARISON:  Lumbar radiographs 1022 hours today. Lumbar spine CT 02/01/2023. FINDINGS: Segmentation: Normal, same numbering system used previously. Alignment: Stable lumbar lordosis with minimal levoconvex lumbar scoliosis. Mild chronic degenerative appearing retrolisthesis of L3 on L4. Vertebrae: Lower thoracic Diffuse idiopathic skeletal hyperostosis (DISH). With flowing endplate osteophytes. Chronic T10-T11 interbody ankylosis. Chronic absent ankylosis at T11-T12, chronically developing at T12-L1. Chronic postoperative changes from L4 to the sacrum, detailed below. Stable vertebral height. Visible lower ribs appear intact. Sacrum and SI joints appear stable and intact. No acute osseous abnormality identified. Paraspinal and other soft tissues: Chronic lower thoracic spinal stimulator, stable.  Electrical leads enter the dorsal spinal canal at the T12 and L1 levels as before. Partially visible left posterior subcutaneous generator. Superimposed other chronic lumbar paraspinal soft tissue changes, stable. Visible noncontrast lower chest, abdomen, pelvis with distended urinary bladder but no other acute visceral finding identified. Calcified aortic atherosclerosis. Disc levels: L3-L4: Chronic adjacent segment disease with progressed vacuum disc since last year. Chronic retrolisthesis, severe facet and ligament flavum hypertrophy. Multifactorial  spinal stenosis which could be moderate or severe (series 4, image 82), stable by CT since last year. L4-L5: Stable chronic decompression and fusion. Evidence of solid arthrodesis via the right posterior elements. L5-S1: Stable chronic decompression and fusion. Chronic posterior element arthrodesis. IMPRESSION: 1. No acute traumatic injury identified in the Lumbar Spine. 2. 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 Progressed vacuum disc since last year, and chronic probably Severe multifactorial spinal stenosis there. 3. Distended urinary bladder, query urinary retention. 4. Chronic lower thoracic spinal stimulator. Aortic Atherosclerosis (ICD10-I70.0). Electronically Signed   By: Odessa Fleming M.D.   On: 10/12/2023 12:12   DG Thoracic Spine 2 View Result Date: 10/12/2023 CLINICAL DATA:  Fall, pain EXAM: THORACIC SPINE 2 VIEWS; LUMBAR SPINE - COMPLETE 4+ VIEW COMPARISON:  None Available. FINDINGS: Significantly limited radiographic assessment of the thoracic spine due to severely underpenetrated lateral views. Within this limitation, no obvious fracture or dislocation. Mild multilevel disc degenerative disease throughout the lumbar spine. Cardiomegaly. Limited assessment of the lumbar spine due to very rotated lateral views provided. Within this limitation, no fracture or dislocation. Mild multilevel disc space height loss and osteophytosis throughout. Status post posterior discectomy and fusion of L4-S1. Nonobstructive pattern of overlying bowel gas. IMPRESSION: 1. Significantly limited radiographic assessment of the thoracic spine due to severely underpenetrated lateral views. Within this limitation, no obvious fracture or dislocation. 2. Limited assessment of the lumbar spine due to very rotated lateral views provided. Within this limitation, no fracture or dislocation. 3. Recommend CT if there is high clinical suspicion for fracture. 4. Mild multilevel  disc degenerative disease throughout the lumbar spine. 5. Status post posterior discectomy and fusion of L4-S1. Electronically Signed   By: Jearld Lesch M.D.   On: 10/12/2023 10:53   DG Lumbar Spine Complete Result Date: 10/12/2023 CLINICAL DATA:  Fall, pain EXAM: THORACIC SPINE 2 VIEWS; LUMBAR SPINE - COMPLETE 4+ VIEW COMPARISON:  None Available. FINDINGS: Significantly limited radiographic assessment of the thoracic spine due to severely underpenetrated lateral views. Within this limitation, no obvious fracture or dislocation. Mild multilevel disc degenerative disease throughout the lumbar spine. Cardiomegaly. Limited assessment of the lumbar spine due to very rotated lateral views provided. Within this limitation, no fracture or dislocation. Mild multilevel disc space height loss and osteophytosis throughout. Status post posterior discectomy and fusion of L4-S1. Nonobstructive pattern of overlying bowel gas. IMPRESSION: 1. Significantly limited radiographic assessment of the thoracic spine due to severely underpenetrated lateral views. Within this limitation, no obvious fracture or dislocation. 2. Limited assessment of the lumbar spine due to very rotated lateral views provided. Within this limitation, no fracture or dislocation. 3. Recommend CT if there is high clinical suspicion for fracture. 4. Mild multilevel disc degenerative disease throughout the lumbar spine. 5. Status post posterior discectomy and fusion of L4-S1. Electronically Signed   By: Jearld Lesch M.D.   On: 10/12/2023 10:53     Assessment and Plan: No notes have been filed under this hospital service. Service: Hospitalist  Principal Problem:  AKI (acute kidney injury) (HCC) Active Problems:   Acute hypokalemia   Hypertension   Asthma, chronic   Hypothyroidism   Back pain   Acute urinary retention  AKI IV fluids Hold Lasix Recheck creatinine in the morning Hypokalemia Replace potassium via oral and IV methods Mag  level okay Recheck potassium in the morning Back pain Patient unable to get MRI here.  Will obtain MRI at [facility] and have neurosurgery follow Urinary retention Will check MRI for cord compression Foley catheter placed Will hold Ditropan, Lasix, anticholinergics Hypothyroidism Continue Synthroid Hypertension   Advance Care Planning:   Code Status: Full Code confirmed by patient  Consults: Neurosurgery  Family Communication: Patient's mother present during interview and exam  Severity of Illness: The appropriate patient status for this patient is INPATIENT. Inpatient status is judged to be reasonable and necessary in order to provide the required intensity of service to ensure the patient's safety. The patient's presenting symptoms, physical exam findings, and initial radiographic and laboratory data in the context of their chronic comorbidities is felt to place them at high risk for further clinical deterioration. Furthermore, it is not anticipated that the patient will be medically stable for discharge from the hospital within 2 midnights of admission.   * I certify that at the point of admission it is my clinical judgment that the patient will require inpatient hospital care spanning beyond 2 midnights from the point of admission due to high intensity of service, high risk for further deterioration and high frequency of surveillance required.*  Author: Levie Heritage, DO 10/12/2023 4:47 PM  For on call review [email or web address].

## 2023-10-12 NOTE — ED Triage Notes (Signed)
Pt fell this am from chair (slid down to floor per pt. States she did not hit head but is having lower back pain.

## 2023-10-12 NOTE — ED Provider Notes (Signed)
[facility] AT [facility] Provider Note   CSN: [id number] Arrival date & time: 10/12/23  0935     History  Chief Complaint  Patient presents with   Back Pain    Marilyn [name] is a 53 y.o. female.  She has PMH of spinal stenosis, status post lumbar Liebkemann ectomy and lumbar fusion, osteoarthritis, morbid obesity and stage II CKD.  Presents the ER today complaining of pain in her upper and lower back after a fall.  She is trying out of bed and states she slipped and fell down onto her buttocks, denies head injury or loss of consciousness having back pain since then.  Reports some pain radiating into the lower legs bilaterally but no numbness or tingling, no saddle anesthesia or paresthesia, no bowel or bladder incontinence. She denies urinary retention   Back Pain      Home Medications Prior to Admission medications   Medication Sig Start Date End Date Taking? Authorizing Provider  albuterol (VENTOLIN HFA) 108 (90 Base) MCG/ACT inhaler Inhale 1-2 puffs into the lungs every 6 (six) hours as needed for wheezing or shortness of breath. 12/09/19  Yes Henderly, Britni A, PA-C  amitriptyline (ELAVIL) 25 MG tablet Take 25 mg by mouth at bedtime.   Yes [provider]  budesonide (PULMICORT) 0.5 MG/2ML nebulizer solution Inhale into the lungs. 01/15/23  Yes [provider]  buPROPion (WELLBUTRIN XL) 150 MG 24 hr tablet Take 150 mg by mouth daily. Take with 300 mg to equal 450 08/27/23 11/25/23 Yes [provider]  buPROPion (WELLBUTRIN XL) 300 MG 24 hr tablet Take 300 mg by mouth daily. Take with 150 mg to equal 450 mg 08/27/23 11/25/23 Yes [provider]  Cetirizine HCl (ZYRTEC ALLERGY) 10 MG CAPS Take by mouth. 04/30/23  Yes [provider]  cyclobenzaprine (FLEXERIL) 10 MG tablet Take 1 tablet by mouth 3 (three) times daily as needed. 01/28/23  Yes [provider]  cycloSPORINE (RESTASIS) 0.05 %  ophthalmic emulsion Place 1 drop into both eyes 2 (two) times daily as needed (dry eyes).   Yes [provider]  DULoxetine (CYMBALTA) 60 MG capsule Take 60 mg by mouth 2 (two) times daily. 05/02/21  Yes [provider]  Ferrous Gluconate 324 (37.5 Fe) MG TABS Take 324 mg by mouth daily. 02/14/20  Yes [provider]  furosemide (LASIX) 20 MG tablet Take 20 mg by mouth daily.   Yes [provider]  hydrochlorothiazide (HYDRODIURIL) 25 MG tablet Take 25 mg by mouth daily.   Yes [provider]  HYDROcodone-acetaminophen (NORCO) 10-325 MG tablet Take 2 tablets by mouth 2 (two) times daily. 10/13/23 11/12/23 Yes [provider]  ipratropium-albuterol (DUONEB) 0.5-2.5 (3) MG/3ML SOLN Inhale into the lungs. 02/15/23  Yes [provider]  levothyroxine (SYNTHROID) 150 MCG tablet Take 150 mcg by mouth daily before breakfast. 05/18/19  Yes [provider]  naloxone (NARCAN) nasal spray 4 mg/0.1 mL Place into the nose. 08/21/22  Yes [provider]  nystatin (MYCOSTATIN/NYSTOP) powder Apply 1 Application topically 3 (three) times daily. 11/06/22  Yes [provider]  omeprazole (PRILOSEC) 40 MG capsule Take 40 mg by mouth 2 (two) times daily.   Yes [provider]  oxybutynin (DITROPAN XL) 15 MG 24 hr tablet Take 15 mg by mouth daily.  05/31/17  Yes [provider]  pilocarpine (SALAGEN) 5 MG tablet Take 5 mg by mouth 3 (three) times daily. 08/24/23  Yes [provider]  pregabalin (LYRICA) 75 MG capsule Take 1 capsule (75 mg total) by mouth 2 (two) times daily. 08/07/22  Yes London Sheer, MD  topiramate (TOPAMAX) 25 MG tablet Take 25 mg by mouth daily.   Yes [provider]  traZODone (DESYREL) 50 MG tablet Take 50-100 mg by mouth at bedtime.   Yes [provider]  vitamin B-12 (CYANOCOBALAMIN) 250 MCG tablet Take 250 mcg by mouth daily.   Yes [provider]  allopurinol  (ZYLOPRIM) 100 MG tablet Take 1 tablet (100 mg total) by mouth 2 (two) times daily. Patient not taking: Reported on 10/12/2023 08/14/23 11/12/23  Cammy Copa, MD  buPROPion (WELLBUTRIN XL) 150 MG 24 hr tablet Take 150 mg by mouth 2 (two) times daily. Take with 300 mg to =450 mg 06/21/22 07/16/23  [provider]  HYDROcodone-acetaminophen (NORCO) 10-325 MG tablet Take by mouth. Patient not taking: Reported on 10/12/2023 09/13/23 10/13/23  [provider]  HYDROcodone-acetaminophen (NORCO/VICODIN) 5-325 MG tablet Take 1 tablet by mouth every 8 (eight) hours as needed. Patient not taking: Reported on 10/12/2023 01/20/23   [provider]  levofloxacin (LEVAQUIN) 500 MG tablet Take 1 tablet (500 mg total) by mouth daily. Patient not taking: Reported on 10/12/2023 04/08/23   Dione Booze, MD      Allergies    Lisinopril, Bee venom, Bupropion, Latex, Propoxyphene, Methadone, Codeine, Meloxicam, Morphine and codeine, Tegaderm ag mesh [silver], and Tomato    Review of Systems   Review of Systems  Musculoskeletal:  Positive for back pain.    Physical Exam Updated Vital Signs BP 106/70   Pulse 73   Temp (!) 97.2 F (36.2 C) (Rectal)   Resp 20   Ht 5' 3.5" (1.613 m)   Wt (!) 154.2 kg   LMP  (LMP Unknown) Comment: tubal ligation  SpO2 99%   BMI 59.28 kg/m  Physical Exam Vitals and nursing note reviewed.  Constitutional:      General: She is not in acute distress.    Appearance: She is well-developed. She is obese.  HENT:     Head: Normocephalic and atraumatic.     Mouth/Throat:     Mouth: Mucous membranes are moist.  Eyes:     Conjunctiva/sclera: Conjunctivae normal.  Cardiovascular:     Rate and Rhythm: Normal rate and regular rhythm.     Heart sounds: No murmur heard. Pulmonary:     Effort: Pulmonary effort is normal. No respiratory distress.     Breath sounds: Normal breath sounds.  Abdominal:     Palpations: Abdomen is soft.     Tenderness: There  is no abdominal tenderness.  Musculoskeletal:        General: No swelling.     Cervical back: Normal and neck supple. No tenderness.     Lumbar back: Bony tenderness present. Positive left straight leg raise test.  Skin:    General: Skin is warm and dry.     Capillary Refill: Capillary refill takes less than 2 seconds.  Neurological:     General: No focal deficit present.     Mental Status: She is alert and oriented to person, place, and time.     Comments: Normal sensation to bilateral lower extremities, pt able to lift both legs off of the bed without resistance  Psychiatric:        Mood and Affect: Mood normal.     ED Results / Procedures / Treatments   Labs (all labs ordered are listed,  but only abnormal results are displayed) Labs Reviewed  CBC WITH DIFFERENTIAL/PLATELET - Abnormal; Notable for the following components:      Result Value   RBC 3.57 (*)    Hemoglobin 10.3 (*)    HCT 32.6 (*)    All other components within normal limits  COMPREHENSIVE METABOLIC PANEL - Abnormal; Notable for the following components:   Potassium 2.6 (*)    BUN 51 (*)    Creatinine, Ser 2.99 (*)    GFR, Estimated 18 (*)    Anion gap 16 (*)    All other components within normal limits  MAGNESIUM  URINALYSIS, ROUTINE W REFLEX MICROSCOPIC    EKG None  Radiology CT Lumbar Spine Wo Contrast Result Date: 10/12/2023 CLINICAL DATA:  53 year old female status post fall. Back pain. Radiculopathy. Prior surgery. EXAM: CT LUMBAR SPINE WITHOUT CONTRAST TECHNIQUE: Multidetector CT imaging of the lumbar spine was performed without intravenous contrast administration. Multiplanar CT image reconstructions were also generated. RADIATION DOSE REDUCTION: This exam was performed according to the departmental dose-optimization program which includes automated exposure control, adjustment of the mA and/or kV according to patient size and/or use of iterative reconstruction technique. COMPARISON:  Lumbar  radiographs 1022 hours today. Lumbar spine CT 02/01/2023. FINDINGS: Segmentation: Normal, same numbering system used previously. Alignment: Stable lumbar lordosis with minimal levoconvex lumbar scoliosis. Mild chronic degenerative appearing retrolisthesis of L3 on L4. Vertebrae: Lower thoracic Diffuse idiopathic skeletal hyperostosis (DISH). With flowing endplate osteophytes. Chronic T10-T11 interbody ankylosis. Chronic absent ankylosis at T11-T12, chronically developing at T12-L1. Chronic postoperative changes from L4 to the sacrum, detailed below. Stable vertebral height. Visible lower ribs appear intact. Sacrum and SI joints appear stable and intact. No acute osseous abnormality identified. Paraspinal and other soft tissues: Chronic lower thoracic spinal stimulator, stable. Electrical leads enter the dorsal spinal canal at the T12 and L1 levels as before. Partially visible left posterior subcutaneous generator. Superimposed other chronic lumbar paraspinal soft tissue changes, stable. Visible noncontrast lower chest, abdomen, pelvis with distended urinary bladder but no other acute visceral finding identified. Calcified aortic atherosclerosis. Disc levels: L3-L4: Chronic adjacent segment disease with progressed vacuum disc since last year. Chronic retrolisthesis, severe facet and ligament flavum hypertrophy. Multifactorial spinal stenosis which could be moderate or severe (series 4, image 82), stable by CT since last year. L4-L5: Stable chronic decompression and fusion. Evidence of solid arthrodesis via the right posterior elements. L5-S1: Stable chronic decompression and fusion. Chronic posterior element arthrodesis. IMPRESSION: 1. No acute traumatic injury identified in the Lumbar Spine. 2. 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 Progressed vacuum disc since last year, and chronic probably Severe multifactorial spinal stenosis there. 3. Distended  urinary bladder, query urinary retention. 4. Chronic lower thoracic spinal stimulator. Aortic Atherosclerosis (ICD10-I70.0). Electronically Signed   By: Odessa Fleming M.D.   On: 10/12/2023 12:12   DG Thoracic Spine 2 View Result Date: 10/12/2023 CLINICAL DATA:  Fall, pain EXAM: THORACIC SPINE 2 VIEWS; LUMBAR SPINE - COMPLETE 4+ VIEW COMPARISON:  None Available. FINDINGS: Significantly limited radiographic assessment of the thoracic spine due to severely underpenetrated lateral views. Within this limitation, no obvious fracture or dislocation. Mild multilevel disc degenerative disease throughout the lumbar spine. Cardiomegaly. Limited assessment of the lumbar spine due to very rotated lateral views provided. Within this limitation, no fracture or dislocation. Mild multilevel disc space height loss and osteophytosis throughout. Status post posterior discectomy and fusion of L4-S1. Nonobstructive pattern of overlying bowel gas.  IMPRESSION: 1. Significantly limited radiographic assessment of the thoracic spine due to severely underpenetrated lateral views. Within this limitation, no obvious fracture or dislocation. 2. Limited assessment of the lumbar spine due to very rotated lateral views provided. Within this limitation, no fracture or dislocation. 3. Recommend CT if there is high clinical suspicion for fracture. 4. Mild multilevel disc degenerative disease throughout the lumbar spine. 5. Status post posterior discectomy and fusion of L4-S1. Electronically Signed   By: Jearld Lesch M.D.   On: 10/12/2023 10:53   DG Lumbar Spine Complete Result Date: 10/12/2023 CLINICAL DATA:  Fall, pain EXAM: THORACIC SPINE 2 VIEWS; LUMBAR SPINE - COMPLETE 4+ VIEW COMPARISON:  None Available. FINDINGS: Significantly limited radiographic assessment of the thoracic spine due to severely underpenetrated lateral views. Within this limitation, no obvious fracture or dislocation. Mild multilevel disc degenerative disease throughout the  lumbar spine. Cardiomegaly. Limited assessment of the lumbar spine due to very rotated lateral views provided. Within this limitation, no fracture or dislocation. Mild multilevel disc space height loss and osteophytosis throughout. Status post posterior discectomy and fusion of L4-S1. Nonobstructive pattern of overlying bowel gas. IMPRESSION: 1. Significantly limited radiographic assessment of the thoracic spine due to severely underpenetrated lateral views. Within this limitation, no obvious fracture or dislocation. 2. Limited assessment of the lumbar spine due to very rotated lateral views provided. Within this limitation, no fracture or dislocation. 3. Recommend CT if there is high clinical suspicion for fracture. 4. Mild multilevel disc degenerative disease throughout the lumbar spine. 5. Status post posterior discectomy and fusion of L4-S1. Electronically Signed   By: Jearld Lesch M.D.   On: 10/12/2023 10:53    Procedures Procedures    Medications Ordered in ED Medications  lidocaine (LIDODERM) 5 % 1 patch (1 patch Transdermal Patch Applied 10/12/23 1218)  potassium chloride SA (KLOR-CON M) CR tablet 40 mEq (has no administration in time range)  potassium chloride 10 mEq in 100 mL IVPB (10 mEq Intravenous New Bag/Given 10/12/23 1653)  oxyCODONE (Oxy IR/ROXICODONE) immediate release tablet 5 mg (5 mg Oral Given 10/12/23 1047)  acetaminophen (TYLENOL) tablet 1,000 mg (1,000 mg Oral Given 10/12/23 1216)  lactated ringers bolus 1,000 mL (1,000 mLs Intravenous New Bag/Given 10/12/23 1540)  potassium chloride 10 mEq in 100 mL IVPB (0 mEq Intravenous Stopped 10/12/23 1651)  fentaNYL (SUBLIMAZE) injection 50 mcg (50 mcg Intravenous Given 10/12/23 1543)    ED Course/ Medical Decision Making/ A&P                                 Medical Decision Making This patient presents to the ED for concern of low back pain after a fall, this involves an extensive number of treatment options, and is a complaint that  carries with it a high risk of complications and morbidity.  The differential diagnosis includes after, HNP, cauda equina, other   Co morbidities that complicate the patient evaluation :   Obesity, osteoarthritis, spinal stenosis   Additional history obtained:  Additional history obtained from EMR External records from outside source obtained and reviewed including prior notes and labs   Lab Tests:  I Ordered, and personally interpreted labs.  The pertinent results include: Hypokalemia, potassium is 2.6, AKI with BUN of 51 and creatinine of 2.99 increased anion gap at 16.  Creatinine was 1.176 months ago   Imaging Studies ordered:  I ordered imaging studies including CT lumbar spine shows which shows  severe disc disease at L3-L4 adjacent to stable chronic L4-S1 decompression, severe spinal stenosis there, distended bladder I independently visualized and interpreted imaging within scope of identifying emergent findings  I agree with the radiologist interpretation    Consultations Obtained:  I requested consultation with the hospitalist, and discussed lab and imaging findings as well as pertinent plan - they recommend: Admission for AKI, consult with neurosurgery for possible MRI  I consulted with Dr. Christell Constant of Ortho care who follows patient for her spinal stenosis.  Recommended admission to [facility], can get MRI for any concern for cauda equina   Problem List / ED Course / Critical interventions / Medication management   Presenting for back pain after a fall, states she slid out of her bed and fell onto her buttocks and complaining of pain to her whole back but worse in the low back with pain radiating down posterior aspect of left leg, positive left leg raise on exam.  She is some relief with oxycodone but was difficult to arouse.  Family at bedside states that this is her usual, they states she stays awake all night and sleeps during the day, regardless we will obtain some basic  labs and urine, patient had significant abnormal labs with hypokalemia and significant AKI.  No leukocytosis, no significant anemia from patient's baseline.  Magnesium normal.  Slightly increased anion gap likely from uremia. 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  Patient was not able to void at all on bedpan, concern for urinary retention causing her AKI, Foley catheter was inserted and returned just over 1000 mL of urine.  Given the urinary retention along with back pain and injury will get MRI.  It is arousable and answering questions, denies decreased p.o. intake, denies vomiting or diarrhea.  Rectal exam difficult due to his body habitus, it seems patient does have some tone but is leaking stool.  Patient's body habitus limits MRI at this facility, patient will have to go to [facility] for MRI  IV fluids, potassium ordered as well as fentanyl as pain has improved  I have reviewed the patients home medicines and have made adjustments as needed     Amount and/or Complexity of Data Reviewed Labs: ordered. Radiology: ordered.  Risk OTC drugs. Prescription drug management. Decision regarding hospitalization.           Final Clinical Impression(s) / ED Diagnoses Final diagnoses:  AKI (acute kidney injury) (HCC)  Hypokalemia  Urinary retention    Rx / DC Orders ED Discharge Orders     None         Ma Rings, PA-C 10/12/23 1742    Coral Spikes, DO 10/13/23 1535

## 2023-10-12 NOTE — Plan of Care (Addendum)
Orthopedic Plan of Care Note  Received consult from [facility] for rule out cauda equina syndrome. I do not have privileges at [facility] and spine surgery does not get performed at that hospital. Patient reportedly has no saddle anesthesia. She has urinary retention and fecal incontinence. Rectal tone was difficult to do per report and BCR was not performed. I told the ER PA that if she has rectal tone, intact sensation in the saddle region, and an intact BCR, then she does not have cauda equina. However, they wanted to get an MRI which I feel is reasonable. This is a surgical urgency though so she needs to get advanced imaging soon and should be transferred to [facility] this evening where I can see the patient and perform surgery if necessary. Given the nature of the condition that the ER staff is concerned about, this transfer should happen urgently so appropriate work up and care can be performed in a timely fashion.  Update: Looking back at the chart. The patient arrived to the ER at 930am. The ER called for concern about an emergent condition at 445pm. Patient remains at [facility] and no advanced imaging has been obtained so I called the ER at 845pm to check in on her status. [facility] has not arrived. They are going to touch base again with [facility] to get her moved to [facility].    London Sheer, MD Orthopedic Surgeon

## 2023-10-12 NOTE — ED Notes (Signed)
Pt refused pain medication at this time. It was pulled from the pyxis then wasted as the outer packaging had been opened.

## 2023-10-12 NOTE — ED Notes (Signed)
Pt away at testing. Will administer medication when she returns.

## 2023-10-13 ENCOUNTER — Inpatient Hospital Stay (HOSPITAL_COMMUNITY): Payer: Medicare HMO

## 2023-10-13 DIAGNOSIS — M545 Low back pain, unspecified: Secondary | ICD-10-CM

## 2023-10-13 DIAGNOSIS — N179 Acute kidney failure, unspecified: Secondary | ICD-10-CM | POA: Diagnosis not present

## 2023-10-13 LAB — RENAL FUNCTION PANEL
Albumin: 2.8 g/dL — ABNORMAL LOW (ref 3.5–5.0)
Anion gap: 14 (ref 5–15)
BUN: 29 mg/dL — ABNORMAL HIGH (ref 6–20)
CO2: 24 mmol/L (ref 22–32)
Calcium: 9.1 mg/dL (ref 8.9–10.3)
Chloride: 104 mmol/L (ref 98–111)
Creatinine, Ser: 1.8 mg/dL — ABNORMAL HIGH (ref 0.44–1.00)
GFR, Estimated: 33 mL/min — ABNORMAL LOW (ref >60)
Glucose, Bld: 72 mg/dL (ref 70–99)
Phosphorus: 3.5 mg/dL (ref 2.5–4.6)
Potassium: 3.1 mmol/L — ABNORMAL LOW (ref 3.5–5.1)
Sodium: 142 mmol/L (ref 135–145)

## 2023-10-13 LAB — CBC
HCT: 31.6 % — ABNORMAL LOW (ref 36.0–46.0)
Hemoglobin: 10.3 g/dL — ABNORMAL LOW (ref 12.0–15.0)
MCH: 28.9 pg (ref 26.0–34.0)
MCHC: 32.6 g/dL (ref 30.0–36.0)
MCV: 88.8 fL (ref 80.0–100.0)
Platelets: 212 10*3/uL (ref 150–400)
RBC: 3.56 MIL/uL — ABNORMAL LOW (ref 3.87–5.11)
RDW: 13.8 % (ref 11.5–15.5)
WBC: 7 10*3/uL (ref 4.0–10.5)
nRBC: 0 % (ref 0.0–0.2)

## 2023-10-13 LAB — BASIC METABOLIC PANEL
Anion gap: 13 (ref 5–15)
BUN: 38 mg/dL — ABNORMAL HIGH (ref 6–20)
CO2: 24 mmol/L (ref 22–32)
Calcium: 9.2 mg/dL (ref 8.9–10.3)
Chloride: 103 mmol/L (ref 98–111)
Creatinine, Ser: 2.22 mg/dL — ABNORMAL HIGH (ref 0.44–1.00)
GFR, Estimated: 26 mL/min — ABNORMAL LOW (ref >60)
Glucose, Bld: 77 mg/dL (ref 70–99)
Potassium: 3.1 mmol/L — ABNORMAL LOW (ref 3.5–5.1)
Sodium: 140 mmol/L (ref 135–145)

## 2023-10-13 MED ORDER — POTASSIUM CHLORIDE IN NACL 20-0.9 MEQ/L-% IV SOLN
INTRAVENOUS | Status: AC
  Filled 2023-10-13 (×2): qty 1000

## 2023-10-13 MED ORDER — CHLORHEXIDINE GLUCONATE CLOTH 2 % EX PADS
6.0000 | MEDICATED_PAD | Freq: Every day | CUTANEOUS | Status: DC
  Administered 2023-10-14 – 2023-10-16 (×3): 6 via TOPICAL

## 2023-10-13 MED ORDER — TAMSULOSIN HCL 0.4 MG PO CAPS
0.4000 mg | ORAL_CAPSULE | Freq: Every day | ORAL | Status: DC
  Administered 2023-10-13 – 2023-10-16 (×4): 0.4 mg via ORAL
  Filled 2023-10-13 (×4): qty 1

## 2023-10-13 MED ORDER — DIAZEPAM 5 MG PO TABS
5.0000 mg | ORAL_TABLET | Freq: Once | ORAL | Status: AC | PRN
  Administered 2023-10-13: 5 mg via ORAL
  Filled 2023-10-13: qty 1

## 2023-10-13 NOTE — Progress Notes (Signed)
Patient has the remote to her [employer].  The device has been charging over 2 hours and it is stating the stimulator battery is still low. The patient states she can fell the stimulator heating up but she can't tell if the device is charged or not.  I informed her that the device needs to be completely charged and able to go into MRI Mode.  We will need to contact [employer] in the morning to follow up with this device if it hasn't charged.[name]

## 2023-10-13 NOTE — Plan of Care (Addendum)
Orthopedic Note  It appears patient has now transferred to [facility]. Will see her this morning. MRI has been ordered and was put in correctly as stat. Has not yet been completed. NPO and no anticoagulation until decision about spine surgery has been made.    London Sheer, MD Orthopedic Surgeon

## 2023-10-13 NOTE — Plan of Care (Signed)
Orthopedic Note  Called MRI techs this morning to see the status of her scan and see when we can get it done. I was told that the patient just got out of the scanner. This was at 645am. I do not see the scan in the system. I also see the scan as ordered. It is not labeled as arrived or exam ended which makes me less confident that it was actually done like I was told.   London Sheer, MD Orthopedic Surgeon

## 2023-10-13 NOTE — Progress Notes (Signed)
PROGRESS NOTE    Marilyn [name]  ZOX:[id number] DOB: 1971-08-10 DOA: 10/12/2023 PCP: Rebecka Apley, NP  Outpatient Specialists:     Brief Narrative:  Patient is a 53 year old female, past medical history significant for asthma, anxiety, GERD, morbid obesity, hypertension, obstructive sleep apnea, hypothyroidism, chronic pain, status post lumbar laminectomy and lumbar fusion, osteoarthritis.  Patient reports long history of instability, especially, difficulties with right lower extremity.  Patient was admitted following a fall.  No loss of consciousness.  Workup done so far has revealed acute kidney injury and hypokalemia.  CT scan done revealed significant urinary retention (1 L of urine was drained on placement of Foley catheter).  Orthopedic surgery team has ordered MRI of the lumbar spine to rule out cauda equina syndrome.  MRI is still pending as patient has stimulators.  Patient-[employer] will need to assess the stimulator prior to MRI.  10/13/2023: Patient seen alongside patient's brother and son.  Potassium of 3.1 noted (improved from 2.6).  Serum creatinine has also improved from 2.99-2.22.  Will start patient on IV fluids.  Dry buccal mucosa noted.  Leave Foley catheter in situ.  Pain is controlled.   Assessment & Plan:   Principal Problem:   AKI (acute kidney injury) (HCC) Active Problems:   Acute hypokalemia   Hypertension   Hypothyroidism   Asthma, chronic   Back pain   Acute urinary retention   AKI on chronic kidney disease stage IIIa: -Baseline serum creatinine of 1.17 and EGFR of 56 mL/min/1.73 m.   -Urinary retention noted. -Patient was on Lasix and HCTZ prior to admission. -Hold diuretics. -Potassium of 3.1 noted. -IV fluids. -Monitor renal function and electrolytes. -Renal ultrasound.   Hypokalemia: -Patient was on Lasix and HCTZ prior to presentation. -Hold HCTZ and Lasix. -Potassium has improved from 2.66-3.1. -IV fluids with  potassium. -Monitor renal function and electrolytes.  Back pain: -Orthopedic input is appreciated. -For MRI of the lumbar spine (when possible).  Patient has a spinal cord stimulator.  See above documentation. -Pain is controlled.  Hypothyroidism: -Continue Synthroid  Hypertension: -Low normal. -Continue to monitor. -IV fluids.  6.  Morbid obesity: -Diet and exercise when feasible. -PCP to consider subcutaneous semaglutide or Mounjaro.  DVT prophylaxis:  Code Status: Full code Family Communication: Son and brother  Disposition Plan: Home eventually.   Consultants:  Orthopedic surgery  Procedures:  None  Antimicrobials:  None   Subjective: No new complaints. Pain is controlled  Objective: Vitals:   10/13/23 0453 10/13/23 0455 10/13/23 0743 10/13/23 1021  BP: (!) 113/43 (!) 116/54  (!) 93/44  Pulse: 78 77 77 78  Resp: 18  18   Temp: 98.6 F (37 C)   98.9 F (37.2 C)  TempSrc:    Oral  SpO2: 99% 100% 100% 98%  Weight:      Height:        Intake/Output Summary (Last 24 hours) at 10/13/2023 1525 Last data filed at 10/13/2023 0400 Gross per 24 hour  Intake 1120 ml  Output 2500 ml  Net -1380 ml   Filed Weights   10/12/23 1007 10/12/23 2240  Weight: (!) 154.2 kg (!) 166.4 kg    Examination:  General exam: Appears calm and comfortable.  Patient is morbidly obese.  Patient moves all extremities. Respiratory system: Clear to auscultation.  Cardiovascular system: S1 & S2 heard Gastrointestinal system: Abdomen is obese, soft and nontender.   Central nervous system: Alert and oriented.  Patient moves all extremities.   Extremities: No  leg edema.  Data Reviewed: I have personally reviewed following labs and imaging studies  CBC: Recent Labs  Lab 10/12/23 1244 10/13/23 0729  WBC 8.7 7.0  NEUTROABS 6.3  --   HGB 10.3* 10.3*  HCT 32.6* 31.6*  MCV 91.3 88.8  PLT 200 212   Basic Metabolic Panel: Recent Labs  Lab 10/12/23 1244 10/13/23 0729  NA  138 140  K 2.6* 3.1*  CL 98 103  CO2 24 24  GLUCOSE 88 77  BUN 51* 38*  CREATININE 2.99* 2.22*  CALCIUM 9.7 9.2  MG 2.2  --    GFR: Estimated Creatinine Clearance: 44.2 mL/min (A) (by C-G formula based on SCr of 2.22 mg/dL (H)). Liver Function Tests: Recent Labs  Lab 10/12/23 1244  AST 37  ALT 19  ALKPHOS 58  BILITOT 0.7  PROT 7.4  ALBUMIN 3.6   No results for input(s): "LIPASE", "AMYLASE" in the last 168 hours. No results for input(s): "AMMONIA" in the last 168 hours. Coagulation Profile: No results for input(s): "INR", "PROTIME" in the last 168 hours. Cardiac Enzymes: No results for input(s): "CKTOTAL", "CKMB", "CKMBINDEX", "TROPONINI" in the last 168 hours. BNP (last 3 results) No results for input(s): "PROBNP" in the last 8760 hours. HbA1C: No results for input(s): "HGBA1C" in the last 72 hours. CBG: No results for input(s): "GLUCAP" in the last 168 hours. Lipid Profile: No results for input(s): "CHOL", "HDL", "LDLCALC", "TRIG", "CHOLHDL", "LDLDIRECT" in the last 72 hours. Thyroid Function Tests: No results for input(s): "TSH", "T4TOTAL", "FREET4", "T3FREE", "THYROIDAB" in the last 72 hours. Anemia Panel: No results for input(s): "VITAMINB12", "FOLATE", "FERRITIN", "TIBC", "IRON", "RETICCTPCT" in the last 72 hours. Urine analysis:    Component Value Date/Time   COLORURINE YELLOW 10/12/2023 2039   APPEARANCEUR HAZY (A) 10/12/2023 2039   LABSPEC 1.012 10/12/2023 2039   PHURINE 5.0 10/12/2023 2039   GLUCOSEU NEGATIVE 10/12/2023 2039   HGBUR NEGATIVE 10/12/2023 2039   BILIRUBINUR NEGATIVE 10/12/2023 2039   KETONESUR NEGATIVE 10/12/2023 2039   PROTEINUR NEGATIVE 10/12/2023 2039   UROBILINOGEN 0.2 07/18/2012 1156   NITRITE NEGATIVE 10/12/2023 2039   LEUKOCYTESUR MODERATE (A) 10/12/2023 2039   Sepsis Labs: @LABRCNTIP (procalcitonin:4,lacticidven:4)  )No results found for this or any previous visit (from the past 240 hours).       Radiology Studies: CT  Lumbar Spine Wo Contrast Result Date: 10/12/2023 CLINICAL DATA:  53 year old female status post fall. Back pain. Radiculopathy. Prior surgery. EXAM: CT LUMBAR SPINE WITHOUT CONTRAST TECHNIQUE: Multidetector CT imaging of the lumbar spine was performed without intravenous contrast administration. Multiplanar CT image reconstructions were also generated. RADIATION DOSE REDUCTION: This exam was performed according to the departmental dose-optimization program which includes automated exposure control, adjustment of the mA and/or kV according to patient size and/or use of iterative reconstruction technique. COMPARISON:  Lumbar radiographs 1022 hours today. Lumbar spine CT 02/01/2023. FINDINGS: Segmentation: Normal, same numbering system used previously. Alignment: Stable lumbar lordosis with minimal levoconvex lumbar scoliosis. Mild chronic degenerative appearing retrolisthesis of L3 on L4. Vertebrae: Lower thoracic Diffuse idiopathic skeletal hyperostosis (DISH). With flowing endplate osteophytes. Chronic T10-T11 interbody ankylosis. Chronic absent ankylosis at T11-T12, chronically developing at T12-L1. Chronic postoperative changes from L4 to the sacrum, detailed below. Stable vertebral height. Visible lower ribs appear intact. Sacrum and SI joints appear stable and intact. No acute osseous abnormality identified. Paraspinal and other soft tissues: Chronic lower thoracic spinal stimulator, stable. Electrical leads enter the dorsal spinal canal at the T12 and L1 levels as before. Partially visible  left posterior subcutaneous generator. Superimposed other chronic lumbar paraspinal soft tissue changes, stable. Visible noncontrast lower chest, abdomen, pelvis with distended urinary bladder but no other acute visceral finding identified. Calcified aortic atherosclerosis. Disc levels: L3-L4: Chronic adjacent segment disease with progressed vacuum disc since last year. Chronic retrolisthesis, severe facet and ligament  flavum hypertrophy. Multifactorial spinal stenosis which could be moderate or severe (series 4, image 82), stable by CT since last year. L4-L5: Stable chronic decompression and fusion. Evidence of solid arthrodesis via the right posterior elements. L5-S1: Stable chronic decompression and fusion. Chronic posterior element arthrodesis. IMPRESSION: 1. No acute traumatic injury identified in the Lumbar Spine. 2. 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 Progressed vacuum disc since last year, and chronic probably Severe multifactorial spinal stenosis there. 3. Distended urinary bladder, query urinary retention. 4. Chronic lower thoracic spinal stimulator. Aortic Atherosclerosis (ICD10-I70.0). Electronically Signed   By: Odessa Fleming M.D.   On: 10/12/2023 12:12   DG Thoracic Spine 2 View Result Date: 10/12/2023 CLINICAL DATA:  Fall, pain EXAM: THORACIC SPINE 2 VIEWS; LUMBAR SPINE - COMPLETE 4+ VIEW COMPARISON:  None Available. FINDINGS: Significantly limited radiographic assessment of the thoracic spine due to severely underpenetrated lateral views. Within this limitation, no obvious fracture or dislocation. Mild multilevel disc degenerative disease throughout the lumbar spine. Cardiomegaly. Limited assessment of the lumbar spine due to very rotated lateral views provided. Within this limitation, no fracture or dislocation. Mild multilevel disc space height loss and osteophytosis throughout. Status post posterior discectomy and fusion of L4-S1. Nonobstructive pattern of overlying bowel gas. IMPRESSION: 1. Significantly limited radiographic assessment of the thoracic spine due to severely underpenetrated lateral views. Within this limitation, no obvious fracture or dislocation. 2. Limited assessment of the lumbar spine due to very rotated lateral views provided. Within this limitation, no fracture or dislocation. 3. Recommend CT if there is high clinical suspicion  for fracture. 4. Mild multilevel disc degenerative disease throughout the lumbar spine. 5. Status post posterior discectomy and fusion of L4-S1. Electronically Signed   By: Jearld Lesch M.D.   On: 10/12/2023 10:53   DG Lumbar Spine Complete Result Date: 10/12/2023 CLINICAL DATA:  Fall, pain EXAM: THORACIC SPINE 2 VIEWS; LUMBAR SPINE - COMPLETE 4+ VIEW COMPARISON:  None Available. FINDINGS: Significantly limited radiographic assessment of the thoracic spine due to severely underpenetrated lateral views. Within this limitation, no obvious fracture or dislocation. Mild multilevel disc degenerative disease throughout the lumbar spine. Cardiomegaly. Limited assessment of the lumbar spine due to very rotated lateral views provided. Within this limitation, no fracture or dislocation. Mild multilevel disc space height loss and osteophytosis throughout. Status post posterior discectomy and fusion of L4-S1. Nonobstructive pattern of overlying bowel gas. IMPRESSION: 1. Significantly limited radiographic assessment of the thoracic spine due to severely underpenetrated lateral views. Within this limitation, no obvious fracture or dislocation. 2. Limited assessment of the lumbar spine due to very rotated lateral views provided. Within this limitation, no fracture or dislocation. 3. Recommend CT if there is high clinical suspicion for fracture. 4. Mild multilevel disc degenerative disease throughout the lumbar spine. 5. Status post posterior discectomy and fusion of L4-S1. Electronically Signed   By: Jearld Lesch M.D.   On: 10/12/2023 10:53        Scheduled Meds:  budesonide  0.5 mg Nebulization Daily   buPROPion  450 mg Oral Daily   [START ON 10/14/2023] Chlorhexidine Gluconate Cloth  6 each Topical Q0600  DULoxetine  60 mg Oral BID   levothyroxine  150 mcg Oral QAC breakfast   lidocaine  1 patch Transdermal Q24H   pantoprazole  40 mg Oral Daily   pregabalin  75 mg Oral BID   Continuous Infusions:  0.9 %  NaCl with KCl 20 mEq / L       LOS: 1 day    Time spent: 35 minutes.    Berton Mount, MD  [facility] Pager #: [phone] 7PM-7AM contact night coverage as above

## 2023-10-13 NOTE — Plan of Care (Addendum)
Orthopedic Note  I had put in a duplicate MRI order to again try to get her MRI done. Day shift MRI tech has let me know that they see the order. Her MRI has not been done. She has a stimulator which they need to research to see if they can do the scan. No one informed me overnight or this morning when I called of the reason for delay. Just now I was also told that they only usually do MRIs with stimulator in place during the week for safety reasons. I stated that I would need a stat CT myelogram then or we have to get her to a higher level of care like [facility] or [facility] where they can obtain the necessary imaging.    London Sheer, MD Orthopedic Surgeon

## 2023-10-13 NOTE — Consult Note (Addendum)
Orthopedic Spine Surgery Consult Note  Assessment: Patient is a 53 y.o. female with low back pain, urinary incontinence, and fecal incontinence. Her history makes this more of a subacute case if it is cauda equina. Her exam is not concerning for cauda equina given intact saddle sensation, intact rectal tone, and intact BCR   Plan: -Recommended MRI of the lumbar spine -Hold anticoagulation and keep NPO for now -Will review MRI and talk with patient to decide on next steps -Patient has a spinal cord stimulator. MRI tech let her know she needs to get the remote so that they can safely do the MRI. She said it was en route this morning -Will continue to follow  ___________________________________________________________________________  Reason for consult: rule out cauda equina syndrome  History: Patient is 53 y.o. female who presented to [facility] ER last night and was eventually transferred to [facility]. She was admitted to a medicine service for AKI. Orthopedics was consulted for possible cauda equina syndrome. Patient had a fall yesterday morning and had acute on chronic low back pain. She also told the ER that she has urinary incontinence and fecal incontinence. She expressed to me that she has been wearing depends for urinary incontinence since before surgery with Dr. Otelia Sergeant which that last surgery was in 2020. She has noticed small amounts of stool coming out and has been constipated recently. She said that this started 10 days ago. She said she knows she has to go to the restroom for bowel movements most of the time but sometimes she notices small amounts of stool in her depends. She currently does not have any pain radiating into her lower extremities. No saddle anesthesia. No numbness or paresthesias in her legs   Review of systems: General: denies fevers and chills, myalgias Neurologic: denies recent changes in vision, slurred speech Abdomen: denies nausea, vomiting,  hematemesis Respiratory: denies cough, shortness of breath  Past medical history: Hypertension Depression Anxiety GERD Rheumatoid arthritis Sleep apnea Chronic pain Hypothyroidism Anemia The Allergies: Lisinopril, bupropion, latex, propoxyphene, methadone, meloxicam, codeine, morphine, Tegaderm   Past surgical history:  Bilateral TKA Left THA Left shoulder replacement  L4-S1 posterior spinal fusion with TLIF's Spinal cord stimulator placement Tubal ligation Cholecystectomy Cesarean section Multiple I&D's  Social history: Denies use of nicotine product (smoking, vaping, patches, smokeless) Alcohol use: Denies Denies recreational drug use  Family history: -reviewed and not pertinent to possible cauda equina syndrome   Physical Exam:  BMI of 67.1  General: no acute distress, appears stated age Neurologic: alert, answering questions appropriately, following commands Cardiovascular: regular rate, no cyanosis Respiratory: unlabored breathing on room air, symmetric chest rise Psychiatric: appropriate affect, normal cadence to speech   MSK (spine):  -Strength exam      Left  Right  EHL    5/5  5/5 TA    5/5  5/5 GSC    5/5  5/5 Knee extension  5/5  5/5 Knee flexion   5/5  5/5 Hip flexion   4/5  4/5  -Sensory exam    Sensation intact to light touch in L1-S1 nerve distributions of bilateral lower extremities  With the help of the nurse and as a chaperone, this portion of the exam was performed. No saddle anesthesia. Intact rectal tone. Foley catheter was in place and BCR was checked. Intact BCR reflex.    CT lumbar spine from 10/12/2023 was independently reviewed and interpreted, showing interbody devices at L4/5 and L5/S1. They appear in appropriate position and have not  backed out. There is posterior instrumented from L4 to S1. Screws appear well contained within bone. Spinal cord stimulator seen. No fracture or dislocation seen.   Patient name: Marilyn [name] Patient MRN: [id number] Date: 10/13/23

## 2023-10-14 ENCOUNTER — Inpatient Hospital Stay (HOSPITAL_COMMUNITY): Payer: Medicare HMO

## 2023-10-14 DIAGNOSIS — N179 Acute kidney failure, unspecified: Secondary | ICD-10-CM | POA: Diagnosis not present

## 2023-10-14 LAB — RENAL FUNCTION PANEL
Albumin: 3 g/dL — ABNORMAL LOW (ref 3.5–5.0)
Anion gap: 14 (ref 5–15)
BUN: 25 mg/dL — ABNORMAL HIGH (ref 6–20)
CO2: 25 mmol/L (ref 22–32)
Calcium: 9.4 mg/dL (ref 8.9–10.3)
Chloride: 105 mmol/L (ref 98–111)
Creatinine, Ser: 1.51 mg/dL — ABNORMAL HIGH (ref 0.44–1.00)
GFR, Estimated: 41 mL/min — ABNORMAL LOW (ref >60)
Glucose, Bld: 79 mg/dL (ref 70–99)
Phosphorus: 2.8 mg/dL (ref 2.5–4.6)
Potassium: 3.2 mmol/L — ABNORMAL LOW (ref 3.5–5.1)
Sodium: 144 mmol/L (ref 135–145)

## 2023-10-14 LAB — GLUCOSE, CAPILLARY: Glucose-Capillary: 77 mg/dL (ref 70–99)

## 2023-10-14 LAB — MAGNESIUM: Magnesium: 1.9 mg/dL (ref 1.7–2.4)

## 2023-10-14 MED ORDER — POTASSIUM CHLORIDE CRYS ER 20 MEQ PO TBCR
40.0000 meq | EXTENDED_RELEASE_TABLET | Freq: Two times a day (BID) | ORAL | Status: AC
  Administered 2023-10-14 (×2): 40 meq via ORAL
  Filled 2023-10-14 (×2): qty 2

## 2023-10-14 MED ORDER — DIAZEPAM 5 MG/ML IJ SOLN: 5.0000 mg | Freq: Once | INTRAMUSCULAR | Status: DC

## 2023-10-14 MED ORDER — GUAIFENESIN-DM 100-10 MG/5ML PO SYRP
5.0000 mL | ORAL_SOLUTION | ORAL | Status: DC | PRN
  Administered 2023-10-14 – 2023-10-16 (×2): 5 mL via ORAL
  Filled 2023-10-14 (×2): qty 10

## 2023-10-14 MED ORDER — LORAZEPAM 2 MG/ML IJ SOLN
1.0000 mg | Freq: Once | INTRAMUSCULAR | Status: AC
  Administered 2023-10-14: 1 mg via INTRAVENOUS
  Filled 2023-10-14: qty 1

## 2023-10-14 NOTE — Progress Notes (Signed)
PROGRESS NOTE    Marilyn [name]  WUJ:[id number] DOB: 1971/06/14 DOA: 10/12/2023 PCP: Rebecka Apley, NP    Brief Narrative:  53 year old with morbid obesity, asthma, anxiety, GERD, sleep apnea hypothyroidism and chronic pain syndrome, chronic back pain with history of lumbar laminectomy and lumbar fusion osteoarthritis and placement of spinal stimulator presented to the hospital with fall, episodic right leg weakness, urinary retention needing Foley catheter.  She presented to [facility] and was transferred to [facility].  She was also found to have AKI.  Admitted with subacute cauda equina syndrome.  Waiting for MRI.  Subjective: Patient seen and examined.  Still waiting for MRI.  Mild pain while at rest.  Patient tells me that she does not feel her stool coming out.  She feels a Foley catheter.  She has episodic weakness of the right leg that comes and goes.  She has been n.p.o. for last 48 hours. Assessment & Plan:   AKI on CKD stage IIIa: Baseline creatinine 1.17.  On Lasix and hydrochlorothiazide at home.  Holding.  Remains on maintenance IV fluids.  Creatinine 1.51.  Potassium 3.2, will replace further.  Renal ultrasound without evidence of hydronephrosis.  Back pain/suspected lumbar spine compression: Followed by orthopedics.  Could not get MRI due to spinal cord stimulator.  Hopefully it will be done today.  Bedrest until MRI or surgical decision. If unable to do MRI, may need CT myelogram.  Chronic medical issues including Hypothyroidism: Synthroid.  Stable Hypertension, blood pressures stable.  Antihypertensives on hold. Sleep apnea, morbid obesity: Use CPAP at night.      DVT prophylaxis: SCDs   Code Status: Full code Family Communication: None at bedside Disposition Plan: Status is: Inpatient Remains inpatient appropriate because: Significant neurological issues, may need inpatient surgery     Consultants:  Orthopedics  Procedures:   None  Antimicrobials:  None     Objective: Vitals:   10/13/23 2044 10/14/23 0449 10/14/23 0752 10/14/23 0833  BP: (!) 137/52 (!) 122/47 (!) 129/53   Pulse: 73 82 82   Resp: 20 17 17    Temp: 98.1 F (36.7 C) 99 F (37.2 C)    TempSrc: Oral     SpO2: 98% 95% 98% 97%  Weight:      Height:        Intake/Output Summary (Last 24 hours) at 10/14/2023 1154 Last data filed at 10/14/2023 0459 Gross per 24 hour  Intake 830.2 ml  Output 1750 ml  Net -919.8 ml   Filed Weights   10/12/23 1007 10/12/23 2240  Weight: (!) 154.2 kg (!) 166.4 kg    Examination:  General exam: Appears calm and comfortable  Respiratory system: Clear to auscultation. Respiratory effort normal. Cardiovascular system: S1 & S2 heard, RRR.  Gastrointestinal system: Soft.  Nontender.  Bowel sound present.  Foley catheter with clear urine. Central nervous system: Alert and oriented. No obvious motor or sensory deficits  Both lower extremities. Psychiatry: Judgement and insight appear normal. Mood & affect appropriate.     Data Reviewed: I have personally reviewed following labs and imaging studies  CBC: Recent Labs  Lab 10/12/23 1244 10/13/23 0729  WBC 8.7 7.0  NEUTROABS 6.3  --   HGB 10.3* 10.3*  HCT 32.6* 31.6*  MCV 91.3 88.8  PLT 200 212   Basic Metabolic Panel: Recent Labs  Lab 10/12/23 1244 10/13/23 0729 10/13/23 2007 10/14/23 0705  NA 138 140 142 144  K 2.6* 3.1* 3.1* 3.2*  CL 98 103  104 105  CO2 24 24 24 25   GLUCOSE 88 77 72 79  BUN 51* 38* 29* 25*  CREATININE 2.99* 2.22* 1.80* 1.51*  CALCIUM 9.7 9.2 9.1 9.4  MG 2.2  --   --  1.9  PHOS  --   --  3.5 2.8   GFR: Estimated Creatinine Clearance: 65 mL/min (A) (by C-G formula based on SCr of 1.51 mg/dL (H)). Liver Function Tests: Recent Labs  Lab 10/12/23 1244 10/13/23 2007 10/14/23 0705  AST 37  --   --   ALT 19  --   --   ALKPHOS 58  --   --   BILITOT 0.7  --   --   PROT 7.4  --   --   ALBUMIN 3.6 2.8* 3.0*   No  results for input(s): "LIPASE", "AMYLASE" in the last 168 hours. No results for input(s): "AMMONIA" in the last 168 hours. Coagulation Profile: No results for input(s): "INR", "PROTIME" in the last 168 hours. Cardiac Enzymes: No results for input(s): "CKTOTAL", "CKMB", "CKMBINDEX", "TROPONINI" in the last 168 hours. BNP (last 3 results) No results for input(s): "PROBNP" in the last 8760 hours. HbA1C: No results for input(s): "HGBA1C" in the last 72 hours. CBG: Recent Labs  Lab 10/14/23 0753  GLUCAP 77   Lipid Profile: No results for input(s): "CHOL", "HDL", "LDLCALC", "TRIG", "CHOLHDL", "LDLDIRECT" in the last 72 hours. Thyroid Function Tests: No results for input(s): "TSH", "T4TOTAL", "FREET4", "T3FREE", "THYROIDAB" in the last 72 hours. Anemia Panel: No results for input(s): "VITAMINB12", "FOLATE", "FERRITIN", "TIBC", "IRON", "RETICCTPCT" in the last 72 hours. Sepsis Labs: No results for input(s): "PROCALCITON", "LATICACIDVEN" in the last 168 hours.  No results found for this or any previous visit (from the past 240 hours).       Radiology Studies: US RENAL Result Date: 10/13/2023 CLINICAL DATA:  [id number].  Acute kidney injury. EXAM: RENAL / URINARY TRACT ULTRASOUND COMPLETE COMPARISON:  CT with IV contrast 04/07/2023 FINDINGS: Right Kidney: Renal measurements: 11.3 x 4.5 x 5.9 cm = volume: 158.22 mL. Echogenicity within normal limits. No mass, stone or hydronephrosis visualized. Left Kidney: Renal measurements: 10.5 by 5.0 x 4.1 cm = volume: 111.78 mL. Echogenicity within normal limits. No mass, stones or hydronephrosis visualized. Bladder: Foley catheter in place. The bladder is contracted and not well seen. Other: No appreciable free fluid. Diffusely attenuating liver consistent with the moderate hepatic steatosis also noted on CT. IMPRESSION: 1. No evidence of hydronephrosis.  Unremarkable kidneys. 2. Foley catheter in place. The bladder is contracted and not well seen. 3.  Moderate hepatic steatosis. Electronically Signed   By: Almira Bar M.D.   On: 10/13/2023 20:40        Scheduled Meds:  budesonide  0.5 mg Nebulization Daily   buPROPion  450 mg Oral Daily   Chlorhexidine Gluconate Cloth  6 each Topical Q0600   DULoxetine  60 mg Oral BID   levothyroxine  150 mcg Oral QAC breakfast   lidocaine  1 patch Transdermal Q24H   pantoprazole  40 mg Oral Daily   potassium chloride  40 mEq Oral BID   pregabalin  75 mg Oral BID   tamsulosin  0.4 mg Oral Daily   Continuous Infusions:  0.9 % NaCl with KCl 20 mEq / L 100 mL/hr at 10/14/23 0459     LOS: 2 days    Time spent: 35 minutes     Dorcas Carrow, MD [facility]

## 2023-10-14 NOTE — Progress Notes (Addendum)
[facility] night cross cover note:   I was contacted by RN who conveyed that the patient had previously required morphine prior to MRI in the setting of patient's claustrophobia.  She is scheduled to undergo MRI this evening and is requesting Valium.  I subsequently ordered Valium 5 mg IV x 1 dose to be administered prior to MRI.  Update: Due to pyxis shortage of Valium, existing order for Valium has been changed to Ativan.   Update: attempts were made to pursue MRI, as ordered, but MRI scanner was incompatible with body habitus.     Newton Pigg, DO Hospitalist

## 2023-10-15 ENCOUNTER — Inpatient Hospital Stay (HOSPITAL_COMMUNITY): Payer: Medicare HMO

## 2023-10-15 DIAGNOSIS — N179 Acute kidney failure, unspecified: Secondary | ICD-10-CM | POA: Diagnosis not present

## 2023-10-15 NOTE — Evaluation (Signed)
Physical Therapy Evaluation Patient Details Name: Marilyn [name] MRN: [id number] DOB: Jul 23, 1971 Today's Date: 10/15/2023  History of Present Illness  53 y/o F presenting to ED on 1/25 with back pain after a fall, CT lumbar spine with severe disc disease at L3-4 adjacent to stable chronic L4-S1 decompression. Concern for subacute cauda equina syndrome, ruled out, admitted for AKI. PMH includes spinal stenosis, s/p lumbar laminectomy and lumbar fusion, OA, morbid obesity, GERD, asthma, anxity, HTN, OSA, hypothyroidism.  Clinical Impression  Pt admitted with above diagnosis. Appears close to baseline based on reported PLOF. Recommended pt transition to RW rather than use her SPC due to reported buckling of Rt leg, which was not apparent during assessment today. Able to ambulate 150 feet with RW, supervision level, no LOB while using this device. Educated on proper use, awareness, and safety. Complains of ongoing RLE paresthesias that do no follow a specific dermatomal pattern based on her descriptions. Follows Dr. Christell Constant for back issues, Dr. August Saucer for Rt rTSA planned for end of month. Good family support at home. Denies any additional equipment needs but recommended looking into a bed rail and she agrees this may be helpful. Open to HHPT follow-up for home safety assessment due to falls. Will follow acutely. Pt currently with functional limitations due to the deficits listed below (see PT Problem List). Pt will benefit from acute skilled PT to increase their independence and safety with mobility to allow discharge.           If plan is discharge home, recommend the following: Assistance with cooking/housework;Assist for transportation   Can travel by private vehicle        Equipment Recommendations None recommended by PT  Recommendations for Other Services       Functional Status Assessment Patient has had a recent decline in their functional status and demonstrates the ability to make  significant improvements in function in a reasonable and predictable amount of time.     Precautions / Restrictions Precautions Precautions: Fall Precaution Comments: multiple falls OOB at home Restrictions Weight Bearing Restrictions Per Provider Order: No      Mobility  Bed Mobility               General bed mobility comments: OOB in chair upon arrival and departure    Transfers Overall transfer level: Needs assistance Equipment used: Rolling walker (2 wheels) Transfers: Sit to/from Stand Sit to Stand: Supervision           General transfer comment: Supervision for safety, cues for RW placement/set-up. no assist needed, stable upon rising with device    Ambulation/Gait Ambulation/Gait assistance: Supervision Gait Distance (Feet): 150 Feet Assistive device: Rolling walker (2 wheels) Gait Pattern/deviations: Step-through pattern, Trunk flexed Gait velocity: dec Gait velocity interpretation: <1.8 ft/sec, indicate of risk for recurrent falls   General Gait Details: Supervision for safety, cues for posture, RW placement closer to proximity. Required 2 standing rest breaks and to lean on RW for support to complete distance. No buckling observed. Educated on safety and awareness.  Stairs            Wheelchair Mobility     Tilt Bed    Modified Rankin (Stroke Patients Only)       Balance Overall balance assessment: Needs assistance Sitting-balance support: Feet supported Sitting balance-Leahy Scale: Good     Standing balance support: No upper extremity supported Standing balance-Leahy Scale: Fair Standing balance comment: Most stable with RW for support.  Pertinent Vitals/Pain Pain Assessment Pain Assessment: No/denies pain    Home Living Family/patient expects to be discharged to:: Private residence Living Arrangements: Parent Available Help at Discharge: Family;Available PRN/intermittently Type of  Home: House Home Access: Ramped entrance       Home Layout: One level Home Equipment: Cane - single point;Grab bars - tub/shower;Hand held shower head;BSC/3in1;Wheelchair - manual;Shower seat;Rolling Environmental consultant (2 wheels)      Prior Function Prior Level of Function : Independent/Modified Independent;Driving             Mobility Comments: uses SPC, denies ambulatory falls ADLs Comments: ind with ADLs     Extremity/Trunk Assessment   Upper Extremity Assessment Upper Extremity Assessment: Defer to OT evaluation    Lower Extremity Assessment Lower Extremity Assessment: Generalized weakness (RLE paresthesias throughout, not a specific dermatomal pattern.)    Cervical / Trunk Assessment Cervical / Trunk Assessment: Other exceptions (body habitus)  Communication   Communication Communication: No apparent difficulties  Cognition Arousal: Alert Behavior During Therapy: WFL for tasks assessed/performed Overall Cognitive Status: Within Functional Limits for tasks assessed                                 General Comments: pt states she has fallen OOB multiple times at home, but never knows it happens, pt is always in a deep sleep and is found on the floor by her father. States she does not remember being brought to the hospital this admission        General Comments General comments (skin integrity, edema, etc.): Educated on safety awareness, transition to RW rather than cane since she states RLE does buckle at times. Told pt to look into bed rails which can be found on [employer] and in hardware stores that fit under bed/mattress and have a railing system to help with bed mobility and reduce fall risk from rolling out of bed.    Exercises     Assessment/Plan    PT Assessment Patient needs continued PT services  PT Problem List Decreased strength;Decreased activity tolerance;Decreased balance;Decreased mobility;Decreased knowledge of use of DME;Impaired  sensation;Obesity       PT Treatment Interventions DME instruction;Gait training;Functional mobility training;Therapeutic activities;Therapeutic exercise;Balance training;Neuromuscular re-education;Patient/family education    PT Goals (Current goals can be found in the Care Plan section)  Acute Rehab PT Goals Patient Stated Goal: Get well, have Rt shoulder replacement end of month PT Goal Formulation: With patient Time For Goal Achievement: 10/29/23 Potential to Achieve Goals: Good    Frequency Min 1X/week     Co-evaluation               AM-PAC PT "6 Clicks" Mobility  Outcome Measure Help needed turning from your back to your side while in a flat bed without using bedrails?: None Help needed moving from lying on your back to sitting on the side of a flat bed without using bedrails?: None Help needed moving to and from a bed to a chair (including a wheelchair)?: None Help needed standing up from a chair using your arms (e.g., wheelchair or bedside chair)?: A Little Help needed to walk in hospital room?: A Little Help needed climbing 3-5 steps with a railing? : A Little 6 Click Score: 21    End of Session Equipment Utilized During Treatment: Gait belt Activity Tolerance: Patient tolerated treatment well Patient left: in chair;with call bell/phone within reach   PT Visit Diagnosis: Other abnormalities of  gait and mobility (R26.89);Muscle weakness (generalized) (M62.81);Repeated falls (R29.6);Unsteadiness on feet (R26.81)    Time: 4098-1191 PT Time Calculation (min) (ACUTE ONLY): 19 min   Charges:   PT Evaluation $PT Eval Low Complexity: 1 Low   PT General Charges $$ ACUTE PT VISIT: 1 Visit         Kathlyn Sacramento, PT, DPT [facility] Physical Therapist Office: [phone] Website: [email or web address]   Berton Mount 10/15/2023, 4:40 PM

## 2023-10-15 NOTE — Evaluation (Signed)
Occupational Therapy Evaluation Patient Details Name: Marilyn [name] MRN: [id number] DOB: [birth year] Today's Date: 10/15/2023   History of Present Illness 53 y/o F presenting to ED on 1/25 with back pain after a fall, CT lumbar spine with severe disc disease at L3-4 adjacent to stable chronic L4-S1 decompression. Concern for subacute cauda equina syndrome, ruled out, admitted for AKI. PMH includes spinal stenosis, s/p lumbar Liebkemann ectomy and lumbar fusion, OA, morbid obesity, GERD, asthma, anxity, HTN, OSA, hypothyroidism   Clinical Impression   Pt reports ind at baseline with ADLs, uses cane for mobility and lives with parent at home who can assist at d/c. Pt currently needing set up -mod A for ADLs and CGA for transfers with RW. Pt educated on back precautions for comfort and verbalized understanding. Pt presenting with impairments listed below, will follow acutely. Recommend HHOT at d/c.        If plan is discharge home, recommend the following: A little help with walking and/or transfers;A little help with bathing/dressing/bathroom;A lot of help with bathing/dressing/bathroom;Assistance with cooking/housework;Assist for transportation;Help with stairs or ramp for entrance    Functional Status Assessment  Patient has had a recent decline in their functional status and demonstrates the ability to make significant improvements in function in a reasonable and predictable amount of time.  Equipment Recommendations  None recommended by OT (pt has all needed DME)    Recommendations for Other Services PT consult     Precautions / Restrictions Precautions Precautions: Fall Precaution Comments: multiple falls OOB at home Restrictions Weight Bearing Restrictions Per Provider Order: No      Mobility Bed Mobility               General bed mobility comments: OOB in chair upon arrival and departure    Transfers Overall transfer level: Needs assistance Equipment used:  Rolling walker (2 wheels) Transfers: Sit to/from Stand Sit to Stand: Contact guard assist                  Balance Overall balance assessment: Needs assistance Sitting-balance support: Feet supported Sitting balance-Leahy Scale: Good Sitting balance - Comments: reaches down to pull up sock without LOB   Standing balance support: During functional activity, Reliant on assistive device for balance Standing balance-Leahy Scale: Poor Standing balance comment: reliant on external support                           ADL either performed or assessed with clinical judgement   ADL Overall ADL's : Needs assistance/impaired Eating/Feeding: Set up   Grooming: Set up;Standing   Upper Body Bathing: Minimal assistance   Lower Body Bathing: Moderate assistance   Upper Body Dressing : Minimal assistance   Lower Body Dressing: Minimal assistance   Toilet Transfer: Contact guard assist;Ambulation;Regular Toilet;Rolling walker (2 wheels)   Toileting- Clothing Manipulation and Hygiene: Minimal assistance       Functional mobility during ADLs: Rolling walker (2 wheels);Contact guard assist       Vision Baseline Vision/History: 1 Wears glasses Vision Assessment?: No apparent visual deficits     Perception Perception: Not tested       Praxis Praxis: Not tested       Pertinent Vitals/Pain Pain Assessment Pain Assessment: No/denies pain     Extremity/Trunk Assessment Upper Extremity Assessment Upper Extremity Assessment: Generalized weakness (limited shoulder AROM R>L UE, hx of L shoulder replacement, plans for R shoulder replacement in Feb 2025)   Lower Extremity Assessment  Lower Extremity Assessment: Defer to PT evaluation (reports decr sensation to R thigh)   Cervical / Trunk Assessment Cervical / Trunk Assessment: Other exceptions (body habitus)   Communication Communication Communication: No apparent difficulties   Cognition Arousal: Alert Behavior  During Therapy: WFL for tasks assessed/performed Overall Cognitive Status: Within Functional Limits for tasks assessed                                 General Comments: pt states she has fallen OOB multiple times at home, but never knows it happens, pt is always in a deep sleep and is found on the floor by her father. States she does not remember being brought to the hospital this admission     General Comments  VSS    Exercises     Shoulder Instructions      Home Living Family/patient expects to be discharged to:: Private residence Living Arrangements: Parent Available Help at Discharge: Family;Available PRN/intermittently Type of Home: House Home Access: Ramped entrance     Home Layout: One level     Bathroom Shower/Tub: Chief Strategy Officer: Handicapped height Bathroom Accessibility: Yes   Home Equipment: Cane - single point;Grab bars - tub/shower;Hand held shower head;BSC/3in1;Wheelchair - manual;Shower seat;Rolling Environmental consultant (2 wheels)          Prior Functioning/Environment Prior Level of Function : Independent/Modified Independent;Driving             Mobility Comments: uses SPC ADLs Comments: ind with ADLs        OT Problem List: Decreased strength;Decreased range of motion;Decreased activity tolerance;Impaired balance (sitting and/or standing);Impaired UE functional use      OT Treatment/Interventions: Self-care/ADL training;Therapeutic exercise;Energy conservation;DME and/or AE instruction;Therapeutic activities;Balance training;Patient/family education    OT Goals(Current goals can be found in the care plan section) Acute Rehab OT Goals Patient Stated Goal: to go home OT Goal Formulation: With patient Time For Goal Achievement: 10/29/23 Potential to Achieve Goals: Good ADL Goals Pt Will Perform Upper Body Dressing: with supervision;sitting Pt Will Perform Lower Body Dressing: with supervision;sitting/lateral leans;sit  to/from stand Pt Will Transfer to Toilet: with supervision;ambulating;regular height toilet Pt Will Perform Tub/Shower Transfer: Tub transfer;Shower transfer;with supervision;ambulating;rolling walker Additional ADL Goal #1: pt will perform bed mobility supervision in prep for ADLs  OT Frequency: Min 1X/week    Co-evaluation              AM-PAC OT "6 Clicks" Daily Activity     Outcome Measure Help from another person eating meals?: None Help from another person taking care of personal grooming?: A Little Help from another person toileting, which includes using toliet, bedpan, or urinal?: A Little Help from another person bathing (including washing, rinsing, drying)?: A Lot Help from another person to put on and taking off regular upper body clothing?: A Little Help from another person to put on and taking off regular lower body clothing?: A Little 6 Click Score: 18   End of Session Equipment Utilized During Treatment: Gait belt;Rolling walker (2 wheels) Nurse Communication: Mobility status  Activity Tolerance: Patient tolerated treatment well Patient left: with call bell/phone within reach;in chair  OT Visit Diagnosis: Unsteadiness on feet (R26.81);Other abnormalities of gait and mobility (R26.89);Muscle weakness (generalized) (M62.81)                Time: 0981-1914 OT Time Calculation (min): 31 min Charges:  OT General Charges $OT Visit: 1 Visit OT Evaluation $  OT Eval Moderate Complexity: 1 Mod OT Treatments $Therapeutic Activity: 8-22 mins  Carver Fila, OTD, OTR/L [employer] Preferred Acute Rehab [phone]   Dalphine Handing 10/15/2023, 3:52 PM

## 2023-10-15 NOTE — Plan of Care (Signed)
Orthopedic Plan of Care Note  Patient's symptoms are subacute in nature. Her exam is not concerning for cauda equina. She has no lower extremity weakness. She was unable to get in the MRI scanner due to her body habitus. Could get a CT myelogram but given the lack of acutely of her symptoms and they high risk nature of surgery especially she has had infections after various other orthopedic surgeries, I do not feel the surgical intervention is warranted at this time. Will hold off on getting the myelogram since this is an invasive procedure and I would want to get one in the future if she were to develop new symptoms. Having an older one would not be helpful if she were to develop new symptoms. Can mobilize patient. Okay for diet and dvt ppx. As I have emphasized to her in the past, she should work on weight loss as her BMI of 67 complicates her care, puts her health at risk, and puts her at risk for complication should a surgery even need to be done. She can follow up outpatient with me.  ]  London Sheer, MD Orthopedic Surgeon

## 2023-10-15 NOTE — Progress Notes (Signed)
PROGRESS NOTE    Marilyn [name]  WUJ:[id number] DOB: March 10, 1971 DOA: 10/12/2023 PCP: Rebecka Apley, NP    Brief Narrative:  52 year old with morbid obesity, asthma, anxiety, GERD, sleep apnea hypothyroidism and chronic pain syndrome, chronic back pain with history of lumbar laminectomy and lumbar fusion osteoarthritis and placement of spinal stimulator presented to the hospital with fall, episodic right leg weakness, urinary retention needing Foley catheter.  She presented to [facility] and was transferred to [facility].  She was also found to have AKI.  Admitted with subacute back pain, suspected cauda equina.  Ruled out.  Subjective: Patient seen and examined.  Overnight events noted.  She could not fit on the MRI table so could not get MRI.  She is tired.  Did not use CPAP overnight.  Complained of watery diarrhea 2-3 in a day.  No recent antibiotic use. Case discussed with orthopedics.  Assessment & Plan:   AKI on CKD stage IIIa: Baseline creatinine 1.17.  On Lasix and hydrochlorothiazide at home.  Holding.  Treated with isotonic fluids.  Already improving.  Renal ultrasound without evidence of hydronephrosis.  Back pain/suspected lumbar spine compression: Followed by orthopedics.  Could not get MRI due to body habitus. Neurological changes are mostly chronic.  Orthopedics recommended immobilization.    Chronic medical issues including  Hypothyroidism: Synthroid.  Stable  Hypertension, blood pressures stable.  Antihypertensives on hold.  Sleep apnea, morbid obesity: Uses CPAP at night.  Did not use CPAP last night.  Mobilize with PT OT.       DVT prophylaxis: SCDs   Code Status: Full code Family Communication: None at bedside Disposition Plan: Status is: Inpatient Remains inpatient appropriate because: Inpatient therapies needed.     Consultants:  Orthopedics  Procedures:  None  Antimicrobials:  None     Objective: Vitals:   10/14/23  1710 10/14/23 2051 10/15/23 0416 10/15/23 0834  BP: (!) 113/47 (!) 120/59 (!) 117/56   Pulse: 78 82 82   Resp: 17 17 17    Temp:  98.1 F (36.7 C) 98.3 F (36.8 C)   TempSrc:  Oral Oral   SpO2: 97% 98% 99% 97%  Weight:      Height:        Intake/Output Summary (Last 24 hours) at 10/15/2023 1301 Last data filed at 10/15/2023 9562 Gross per 24 hour  Intake --  Output 1500 ml  Net -1500 ml   Filed Weights   10/12/23 1007 10/12/23 2240  Weight: (!) 154.2 kg (!) 166.4 kg    Examination:  General exam: Appears calm and comfortable.  Anxious today.  Not in any distress. Respiratory system: Clear to auscultation. Respiratory effort normal. Cardiovascular system: S1 & S2 heard, RRR.  Gastrointestinal system: Soft.  Nontender.  Bowel sound present.  Foley catheter with clear urine. Central nervous system: Alert and oriented. No obvious motor or sensory deficits  Both lower extremities. Psychiatry: Judgement and insight appear normal. Mood & affect anxious and flat.    Data Reviewed: I have personally reviewed following labs and imaging studies  CBC: Recent Labs  Lab 10/12/23 1244 10/13/23 0729  WBC 8.7 7.0  NEUTROABS 6.3  --   HGB 10.3* 10.3*  HCT 32.6* 31.6*  MCV 91.3 88.8  PLT 200 212   Basic Metabolic Panel: Recent Labs  Lab 10/12/23 1244 10/13/23 0729 10/13/23 2007 10/14/23 0705  NA 138 140 142 144  K 2.6* 3.1* 3.1* 3.2*  CL 98 103 104 105  CO2 24  24 24 25   GLUCOSE 88 77 72 79  BUN 51* 38* 29* 25*  CREATININE 2.99* 2.22* 1.80* 1.51*  CALCIUM 9.7 9.2 9.1 9.4  MG 2.2  --   --  1.9  PHOS  --   --  3.5 2.8   GFR: Estimated Creatinine Clearance: 65 mL/min (A) (by C-G formula based on SCr of 1.51 mg/dL (H)). Liver Function Tests: Recent Labs  Lab 10/12/23 1244 10/13/23 2007 10/14/23 0705  AST 37  --   --   ALT 19  --   --   ALKPHOS 58  --   --   BILITOT 0.7  --   --   PROT 7.4  --   --   ALBUMIN 3.6 2.8* 3.0*   No results for input(s): "LIPASE",  "AMYLASE" in the last 168 hours. No results for input(s): "AMMONIA" in the last 168 hours. Coagulation Profile: No results for input(s): "INR", "PROTIME" in the last 168 hours. Cardiac Enzymes: No results for input(s): "CKTOTAL", "CKMB", "CKMBINDEX", "TROPONINI" in the last 168 hours. BNP (last 3 results) No results for input(s): "PROBNP" in the last 8760 hours. HbA1C: No results for input(s): "HGBA1C" in the last 72 hours. CBG: Recent Labs  Lab 10/14/23 0753  GLUCAP 77   Lipid Profile: No results for input(s): "CHOL", "HDL", "LDLCALC", "TRIG", "CHOLHDL", "LDLDIRECT" in the last 72 hours. Thyroid Function Tests: No results for input(s): "TSH", "T4TOTAL", "FREET4", "T3FREE", "THYROIDAB" in the last 72 hours. Anemia Panel: No results for input(s): "VITAMINB12", "FOLATE", "FERRITIN", "TIBC", "IRON", "RETICCTPCT" in the last 72 hours. Sepsis Labs: No results for input(s): "PROCALCITON", "LATICACIDVEN" in the last 168 hours.  No results found for this or any previous visit (from the past 240 hours).       Radiology Studies: US RENAL Result Date: 10/13/2023 CLINICAL DATA:  [id number].  Acute kidney injury. EXAM: RENAL / URINARY TRACT ULTRASOUND COMPLETE COMPARISON:  CT with IV contrast 04/07/2023 FINDINGS: Right Kidney: Renal measurements: 11.3 x 4.5 x 5.9 cm = volume: 158.22 mL. Echogenicity within normal limits. No mass, stone or hydronephrosis visualized. Left Kidney: Renal measurements: 10.5 by 5.0 x 4.1 cm = volume: 111.78 mL. Echogenicity within normal limits. No mass, stones or hydronephrosis visualized. Bladder: Foley catheter in place. The bladder is contracted and not well seen. Other: No appreciable free fluid. Diffusely attenuating liver consistent with the moderate hepatic steatosis also noted on CT. IMPRESSION: 1. No evidence of hydronephrosis.  Unremarkable kidneys. 2. Foley catheter in place. The bladder is contracted and not well seen. 3. Moderate hepatic steatosis.  Electronically Signed   By: Almira Bar M.D.   On: 10/13/2023 20:40        Scheduled Meds:  budesonide  0.5 mg Nebulization Daily   buPROPion  450 mg Oral Daily   Chlorhexidine Gluconate Cloth  6 each Topical Q0600   DULoxetine  60 mg Oral BID   levothyroxine  150 mcg Oral QAC breakfast   lidocaine  1 patch Transdermal Q24H   pantoprazole  40 mg Oral Daily   pregabalin  75 mg Oral BID   tamsulosin  0.4 mg Oral Daily   Continuous Infusions:     LOS: 3 days    Time spent: 35 minutes     Dorcas Carrow, MD [facility]

## 2023-10-15 NOTE — Care Management Important Message (Signed)
Important Message  Patient Details  Name: Marilyn [name] MRN: [id number] Date of Birth: 1971-06-20   Important Message Given:  Yes - Medicare IM     Dorena Bodo 10/15/2023, 2:51 PM

## 2023-10-16 DIAGNOSIS — N179 Acute kidney failure, unspecified: Secondary | ICD-10-CM | POA: Diagnosis not present

## 2023-10-16 NOTE — Progress Notes (Signed)
Patient verbalized understanding of dc instructions. All belongings given to patient.

## 2023-10-16 NOTE — Progress Notes (Signed)
Transition of Care [facility]) - Inpatient Brief Assessment   Patient Details  Name: HANIFA ANTONETTI MRN: [id number] Date of Birth: 12-30-1970  Transition of Care [facility]) CM/SW Contact:    Janae Bridgeman, RN Phone Number: 10/16/2023, 12:04 PM   Clinical Narrative: CM met with the patient prior to discharge to home and patient states that she plans to return home with her parents today.  Patient was offered Medicare choice regarding home health services and patient states that she was recently active with [facility] and plans to have home health services through them.  I called Clifton Custard, CM with [facility] and he accepted for services.  HH orders were modified and OT was added to the orders.  Patient has DMe at the home that includes WC, 2 Canes, RW, shower chair, CPAP machine, and nebulizer.  Patient states that she is working with her PCP to obtain personal care services through [facility] as well.  No other TOC needs and patient is discharging to home today.   Transition of Care Asessment: Insurance and Status: Insurance coverage has been reviewed Patient has primary care physician: Yes Home environment has been reviewed: from home with parents Prior level of function:: Counselling psychologist Home Services: No current home services Social Drivers of Health Review: SDOH reviewed needs interventions Readmission risk has been reviewed: Yes Transition of care needs: (P) transition of care needs identified, TOC will continue to follow

## 2023-10-16 NOTE — Discharge Summary (Signed)
Physician Discharge Summary  Marilyn [name] ZOX:[id number] DOB: 01/27/1971 DOA: 10/12/2023  PCP: Rebecka Apley, NP  Admit date: 10/12/2023 Discharge date: 10/16/2023  Admitted From: Home Disposition: Home with home health therapies  Recommendations for Outpatient Follow-up:  Follow up with PCP in 1-2 weeks Orthopedics to schedule follow-up  Home Health: Physical therapy Equipment/Devices: Present at home  Discharge Condition: Stable CODE STATUS: Full code Diet recommendation: Low-salt diet  Discharge summary: 53 year old with morbid obesity, asthma, anxiety, GERD, sleep apnea, hypothyroidism and chronic pain syndrome, chronic back pain with history of lumbar laminectomy and lumbar fusion osteoarthritis and placement of spinal stimulator presented to the hospital with fall, episodic right leg weakness, urinary retention needing Foley catheter.  She presented to [facility] and was transferred to [facility].  She was also found to have AKI.  Admitted with subacute back pain, suspected cauda equina.  Ruled out.   AKI on CKD stage IIIa: Baseline creatinine 1.17.  On Lasix and hydrochlorothiazide at home.  This was held and she was treated with isotonic fluid.  Appropriately improved.  Renal ultrasound without evidence of hydronephrosis.   Patient does not need 2 diuretics.  Will ask to continue hydrochlorothiazide.  Discontinue Lasix.   Last creatinine 1.5 on 1/27.   Back pain/suspected lumbar spine compression: Followed by orthopedics.  Could not get MRI due to body habitus. Neurological changes are mostly chronic.  Patient without any new neurological findings.  She did not have any evidence of cauda equina syndrome.  She was able to mobilize and Foley catheter was removed with successful voiding trial.  She is on multiple pain medications and maintenance medications that she will continue.   Hypothyroidism: Synthroid.  Stable   Hypertension, blood pressures stable.   Resume blood pressure medications.   Sleep apnea, morbid obesity: Uses CPAP at night.  Stated compliance.   Morbid obesity: Complicating care and surgical outcomes.  Patient mobilized with therapies.  She did pretty well.  Currently without any new neurological deficits.  Stable to discharge home.  She will benefit with ongoing therapies at home.  Home health physical therapy ordered.  Discharge Diagnoses:  Principal Problem:   AKI (acute kidney injury) (HCC) Active Problems:   Acute hypokalemia   Hypertension   Asthma, chronic   Hypothyroidism   Back pain   Acute urinary retention    Discharge Instructions  Discharge Instructions     Diet - low sodium heart healthy   Complete by: As directed    Increase activity slowly   Complete by: As directed       Allergies as of 10/16/2023       Reactions   Lisinopril Anaphylaxis   Angioedema   Bee Venom Swelling, Other (See Comments)   Swelling at the site   Bupropion Swelling   Latex Itching, Rash   Propoxyphene Hives   Methadone Other (See Comments)   unknown   Codeine Nausea Only   Meloxicam Other (See Comments)   Insomnia, constipation   Morphine And Codeine Rash   Tegaderm Ag Mesh [silver] Hives, Itching, Rash   Tomato Rash        Medication List     STOP taking these medications    furosemide 20 MG tablet Commonly known as: LASIX   levofloxacin 500 MG tablet Commonly known as: LEVAQUIN       TAKE these medications    albuterol 108 (90 Base) MCG/ACT inhaler Commonly known as: VENTOLIN HFA Inhale 1-2 puffs into the lungs  every 6 (six) hours as needed for wheezing or shortness of breath.   allopurinol 100 MG tablet Commonly known as: ZYLOPRIM Take 1 tablet (100 mg total) by mouth 2 (two) times daily.   amitriptyline 25 MG tablet Commonly known as: ELAVIL Take 25 mg by mouth at bedtime.   budesonide 0.5 MG/2ML nebulizer solution Commonly known as: PULMICORT Inhale into the lungs.    buPROPion 150 MG 24 hr tablet Commonly known as: WELLBUTRIN XL Take 150 mg by mouth daily. Take with 300 mg to equal 450 What changed: Another medication with the same name was removed. Continue taking this medication, and follow the directions you see here.   buPROPion 300 MG 24 hr tablet Commonly known as: WELLBUTRIN XL Take 300 mg by mouth daily. Take with 150 mg to equal 450 mg What changed: Another medication with the same name was removed. Continue taking this medication, and follow the directions you see here.   cyclobenzaprine 10 MG tablet Commonly known as: FLEXERIL Take 1 tablet by mouth 3 (three) times daily as needed.   cycloSPORINE 0.05 % ophthalmic emulsion Commonly known as: RESTASIS Place 1 drop into both eyes 2 (two) times daily as needed (dry eyes).   DULoxetine 60 MG capsule Commonly known as: CYMBALTA Take 60 mg by mouth 2 (two) times daily.   Ferrous Gluconate 324 (37.5 Fe) MG Tabs Take 324 mg by mouth daily.   hydrochlorothiazide 25 MG tablet Commonly known as: HYDRODIURIL Take 25 mg by mouth daily.   HYDROcodone-acetaminophen 5-325 MG tablet Commonly known as: NORCO/VICODIN Take 1 tablet by mouth every 8 (eight) hours as needed. What changed: Another medication with the same name was removed. Continue taking this medication, and follow the directions you see here.   HYDROcodone-acetaminophen 10-325 MG tablet Commonly known as: NORCO Take 2 tablets by mouth 2 (two) times daily. What changed: Another medication with the same name was removed. Continue taking this medication, and follow the directions you see here.   ipratropium-albuterol 0.5-2.5 (3) MG/3ML Soln Commonly known as: DUONEB Inhale into the lungs.   levothyroxine 150 MCG tablet Commonly known as: SYNTHROID Take 150 mcg by mouth daily before breakfast.   naloxone 4 MG/0.1ML Liqd nasal spray kit Commonly known as: NARCAN Place into the nose.   nystatin powder Commonly known as:  MYCOSTATIN/NYSTOP Apply 1 Application topically 3 (three) times daily.   omeprazole 40 MG capsule Commonly known as: PRILOSEC Take 40 mg by mouth 2 (two) times daily.   oxybutynin 15 MG 24 hr tablet Commonly known as: DITROPAN XL Take 15 mg by mouth daily.   pilocarpine 5 MG tablet Commonly known as: SALAGEN Take 5 mg by mouth 3 (three) times daily.   pregabalin 75 MG capsule Commonly known as: LYRICA Take 1 capsule (75 mg total) by mouth 2 (two) times daily.   topiramate 25 MG tablet Commonly known as: TOPAMAX Take 25 mg by mouth daily.   traZODone 50 MG tablet Commonly known as: DESYREL Take 50-100 mg by mouth at bedtime.   vitamin B-12 250 MCG tablet Commonly known as: CYANOCOBALAMIN Take 250 mcg by mouth daily.   ZyrTEC Allergy 10 MG Caps Generic drug: Cetirizine HCl Take by mouth.        Allergies  Allergen Reactions   Lisinopril Anaphylaxis    Angioedema   Bee Venom Swelling and Other (See Comments)    Swelling at the site   Bupropion Swelling   Latex Itching and Rash   Propoxyphene Hives  Methadone Other (See Comments)    unknown   Codeine Nausea Only   Meloxicam Other (See Comments)    Insomnia, constipation   Morphine And Codeine Rash   Tegaderm Ag Mesh [Silver] Hives, Itching and Rash   Tomato Rash    Consultations: Orthopedics   Procedures/Studies: US RENAL Result Date: 10/13/2023 CLINICAL DATA:  [id number].  Acute kidney injury. EXAM: RENAL / URINARY TRACT ULTRASOUND COMPLETE COMPARISON:  CT with IV contrast 04/07/2023 FINDINGS: Right Kidney: Renal measurements: 11.3 x 4.5 x 5.9 cm = volume: 158.22 mL. Echogenicity within normal limits. No mass, stone or hydronephrosis visualized. Left Kidney: Renal measurements: 10.5 by 5.0 x 4.1 cm = volume: 111.78 mL. Echogenicity within normal limits. No mass, stones or hydronephrosis visualized. Bladder: Foley catheter in place. The bladder is contracted and not well seen. Other: No appreciable free fluid.  Diffusely attenuating liver consistent with the moderate hepatic steatosis also noted on CT. IMPRESSION: 1. No evidence of hydronephrosis.  Unremarkable kidneys. 2. Foley catheter in place. The bladder is contracted and not well seen. 3. Moderate hepatic steatosis. Electronically Signed   By: Almira Bar M.D.   On: 10/13/2023 20:40   CT Lumbar Spine Wo Contrast Result Date: 10/12/2023 CLINICAL DATA:  53 year old female status post fall. Back pain. Radiculopathy. Prior surgery. EXAM: CT LUMBAR SPINE WITHOUT CONTRAST TECHNIQUE: Multidetector CT imaging of the lumbar spine was performed without intravenous contrast administration. Multiplanar CT image reconstructions were also generated. RADIATION DOSE REDUCTION: This exam was performed according to the departmental dose-optimization program which includes automated exposure control, adjustment of the mA and/or kV according to patient size and/or use of iterative reconstruction technique. COMPARISON:  Lumbar radiographs 1022 hours today. Lumbar spine CT 02/01/2023. FINDINGS: Segmentation: Normal, same numbering system used previously. Alignment: Stable lumbar lordosis with minimal levoconvex lumbar scoliosis. Mild chronic degenerative appearing retrolisthesis of L3 on L4. Vertebrae: Lower thoracic Diffuse idiopathic skeletal hyperostosis (DISH). With flowing endplate osteophytes. Chronic T10-T11 interbody ankylosis. Chronic absent ankylosis at T11-T12, chronically developing at T12-L1. Chronic postoperative changes from L4 to the sacrum, detailed below. Stable vertebral height. Visible lower ribs appear intact. Sacrum and SI joints appear stable and intact. No acute osseous abnormality identified. Paraspinal and other soft tissues: Chronic lower thoracic spinal stimulator, stable. Electrical leads enter the dorsal spinal canal at the T12 and L1 levels as before. Partially visible left posterior subcutaneous generator. Superimposed other chronic lumbar paraspinal  soft tissue changes, stable. Visible noncontrast lower chest, abdomen, pelvis with distended urinary bladder but no other acute visceral finding identified. Calcified aortic atherosclerosis. Disc levels: L3-L4: Chronic adjacent segment disease with progressed vacuum disc since last year. Chronic retrolisthesis, severe facet and ligament flavum hypertrophy. Multifactorial spinal stenosis which could be moderate or severe (series 4, image 82), stable by CT since last year. L4-L5: Stable chronic decompression and fusion. Evidence of solid arthrodesis via the right posterior elements. L5-S1: Stable chronic decompression and fusion. Chronic posterior element arthrodesis. IMPRESSION: 1. No acute traumatic injury identified in the Lumbar Spine. 2. 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 Progressed vacuum disc since last year, and chronic probably Severe multifactorial spinal stenosis there. 3. Distended urinary bladder, query urinary retention. 4. Chronic lower thoracic spinal stimulator. Aortic Atherosclerosis (ICD10-I70.0). Electronically Signed   By: Odessa Fleming M.D.   On: 10/12/2023 12:12   DG Thoracic Spine 2 View Result Date: 10/12/2023 CLINICAL DATA:  Fall, pain EXAM: THORACIC SPINE 2 VIEWS; LUMBAR SPINE - COMPLETE  4+ VIEW COMPARISON:  None Available. FINDINGS: Significantly limited radiographic assessment of the thoracic spine due to severely underpenetrated lateral views. Within this limitation, no obvious fracture or dislocation. Mild multilevel disc degenerative disease throughout the lumbar spine. Cardiomegaly. Limited assessment of the lumbar spine due to very rotated lateral views provided. Within this limitation, no fracture or dislocation. Mild multilevel disc space height loss and osteophytosis throughout. Status post posterior discectomy and fusion of L4-S1. Nonobstructive pattern of overlying bowel gas. IMPRESSION: 1. Significantly limited  radiographic assessment of the thoracic spine due to severely underpenetrated lateral views. Within this limitation, no obvious fracture or dislocation. 2. Limited assessment of the lumbar spine due to very rotated lateral views provided. Within this limitation, no fracture or dislocation. 3. Recommend CT if there is high clinical suspicion for fracture. 4. Mild multilevel disc degenerative disease throughout the lumbar spine. 5. Status post posterior discectomy and fusion of L4-S1. Electronically Signed   By: Jearld Lesch M.D.   On: 10/12/2023 10:53   DG Lumbar Spine Complete Result Date: 10/12/2023 CLINICAL DATA:  Fall, pain EXAM: THORACIC SPINE 2 VIEWS; LUMBAR SPINE - COMPLETE 4+ VIEW COMPARISON:  None Available. FINDINGS: Significantly limited radiographic assessment of the thoracic spine due to severely underpenetrated lateral views. Within this limitation, no obvious fracture or dislocation. Mild multilevel disc degenerative disease throughout the lumbar spine. Cardiomegaly. Limited assessment of the lumbar spine due to very rotated lateral views provided. Within this limitation, no fracture or dislocation. Mild multilevel disc space height loss and osteophytosis throughout. Status post posterior discectomy and fusion of L4-S1. Nonobstructive pattern of overlying bowel gas. IMPRESSION: 1. Significantly limited radiographic assessment of the thoracic spine due to severely underpenetrated lateral views. Within this limitation, no obvious fracture or dislocation. 2. Limited assessment of the lumbar spine due to very rotated lateral views provided. Within this limitation, no fracture or dislocation. 3. Recommend CT if there is high clinical suspicion for fracture. 4. Mild multilevel disc degenerative disease throughout the lumbar spine. 5. Status post posterior discectomy and fusion of L4-S1. Electronically Signed   By: Jearld Lesch M.D.   On: 10/12/2023 10:53   (Echo, Carotid, EGD, Colonoscopy, ERCP)     Subjective: Patient seen and examined.  Denies any complaints.  No more diarrhea.  She feels like she is back to herself.   Discharge Exam: Vitals:   10/16/23 0810 10/16/23 0811  BP: 117/63   Pulse: 71   Resp: 18   Temp: 97.6 F (36.4 C)   SpO2: 95% 95%   Vitals:   10/16/23 0059 10/16/23 0402 10/16/23 0810 10/16/23 0811  BP:  104/79 117/63   Pulse: 74 72 71   Resp: 16 19 18    Temp:  98.3 F (36.8 C) 97.6 F (36.4 C)   TempSrc:   Oral   SpO2: 98% 100% 95% 95%  Weight:      Height:        General: Pt is alert, awake, not in acute distress Sitting in chair and eating breakfast. Cardiovascular: RRR, S1/S2 +, no rubs, no gallops Respiratory: CTA bilaterally, no wheezing, no rhonchi Abdominal: Soft, NT, ND, bowel sounds +, obese and pendulous. Extremities: no edema, no cyanosis    The results of significant diagnostics from this hospitalization (including imaging, microbiology, ancillary and laboratory) are listed below for reference.     Microbiology: No results found for this or any previous visit (from the past 240 hours).   Labs: BNP (last 3 results) No results for input(s): "  BNP" in the last 8760 hours. Basic Metabolic Panel: Recent Labs  Lab 10/12/23 1244 10/13/23 0729 10/13/23 2007 10/14/23 0705  NA 138 140 142 144  K 2.6* 3.1* 3.1* 3.2*  CL 98 103 104 105  CO2 24 24 24 25   GLUCOSE 88 77 72 79  BUN 51* 38* 29* 25*  CREATININE 2.99* 2.22* 1.80* 1.51*  CALCIUM 9.7 9.2 9.1 9.4  MG 2.2  --   --  1.9  PHOS  --   --  3.5 2.8   Liver Function Tests: Recent Labs  Lab 10/12/23 1244 10/13/23 2007 10/14/23 0705  AST 37  --   --   ALT 19  --   --   ALKPHOS 58  --   --   BILITOT 0.7  --   --   PROT 7.4  --   --   ALBUMIN 3.6 2.8* 3.0*   No results for input(s): "LIPASE", "AMYLASE" in the last 168 hours. No results for input(s): "AMMONIA" in the last 168 hours. CBC: Recent Labs  Lab 10/12/23 1244 10/13/23 0729  WBC 8.7 7.0  NEUTROABS 6.3   --   HGB 10.3* 10.3*  HCT 32.6* 31.6*  MCV 91.3 88.8  PLT 200 212   Cardiac Enzymes: No results for input(s): "CKTOTAL", "CKMB", "CKMBINDEX", "TROPONINI" in the last 168 hours. BNP: Invalid input(s): "POCBNP" CBG: Recent Labs  Lab 10/14/23 0753  GLUCAP 77   D-Dimer No results for input(s): "DDIMER" in the last 72 hours. Hgb A1c No results for input(s): "HGBA1C" in the last 72 hours. Lipid Profile No results for input(s): "CHOL", "HDL", "LDLCALC", "TRIG", "CHOLHDL", "LDLDIRECT" in the last 72 hours. Thyroid function studies No results for input(s): "TSH", "T4TOTAL", "T3FREE", "THYROIDAB" in the last 72 hours.  Invalid input(s): "FREET3" Anemia work up No results for input(s): "VITAMINB12", "FOLATE", "FERRITIN", "TIBC", "IRON", "RETICCTPCT" in the last 72 hours. Urinalysis    Component Value Date/Time   COLORURINE YELLOW 10/12/2023 2039   APPEARANCEUR HAZY (A) 10/12/2023 2039   LABSPEC 1.012 10/12/2023 2039   PHURINE 5.0 10/12/2023 2039   GLUCOSEU NEGATIVE 10/12/2023 2039   HGBUR NEGATIVE 10/12/2023 2039   BILIRUBINUR NEGATIVE 10/12/2023 2039   KETONESUR NEGATIVE 10/12/2023 2039   PROTEINUR NEGATIVE 10/12/2023 2039   UROBILINOGEN 0.2 07/18/2012 1156   NITRITE NEGATIVE 10/12/2023 2039   LEUKOCYTESUR MODERATE (A) 10/12/2023 2039   Sepsis Labs Recent Labs  Lab 10/12/23 1244 10/13/23 0729  WBC 8.7 7.0   Microbiology No results found for this or any previous visit (from the past 240 hours).   Time coordinating discharge: 35 minutes  SIGNED:   Dorcas Carrow, MD  [facility] 10/16/2023, 10:12 AM

## 2023-10-16 NOTE — Plan of Care (Signed)
  Problem: Education: Goal: Knowledge of General Education information will improve Description: Including pain rating scale, medication(s)/side effects and non-pharmacologic comfort measures Outcome: Progressing   Problem: Clinical Measurements: Goal: Ability to maintain clinical measurements within normal limits will improve Outcome: Progressing   Problem: Elimination: Goal: Will not experience complications related to urinary retention Outcome: Progressing   Problem: Pain Managment: Goal: General experience of comfort will improve and/or be controlled Outcome: Progressing

## 2023-10-28 NOTE — Telephone Encounter (Signed)
 Marilyn [name], looks like Josalin never had her labs done.  She is going to come in on Wednesday morning for labs to include albumin  and vitamin D 

## 2023-10-30 ENCOUNTER — Other Ambulatory Visit: Payer: Self-pay

## 2023-10-30 ENCOUNTER — Ambulatory Visit (INDEPENDENT_AMBULATORY_CARE_PROVIDER_SITE_OTHER): Payer: Medicare HMO

## 2023-10-30 DIAGNOSIS — N182 Chronic kidney disease, stage 2 (mild): Secondary | ICD-10-CM

## 2023-10-30 NOTE — Progress Notes (Signed)
Patient was here for lab draw only.

## 2023-10-31 LAB — VITAMIN D 25 HYDROXY (VIT D DEFICIENCY, FRACTURES): Vit D, 25-Hydroxy: 44 ng/mL (ref 30–100)

## 2023-10-31 LAB — ALBUMIN: Albumin: 4.6 g/dL (ref 3.6–5.1)

## 2023-11-06 ENCOUNTER — Other Ambulatory Visit: Payer: Self-pay

## 2023-11-06 ENCOUNTER — Emergency Department (HOSPITAL_BASED_OUTPATIENT_CLINIC_OR_DEPARTMENT_OTHER)
Admission: EM | Admit: 2023-11-06 | Discharge: 2023-11-06 | Disposition: A | Discharge status: Home / Self Care | Payer: Medicare HMO | Attending: Emergency Medicine | Admitting: Emergency Medicine

## 2023-11-06 ENCOUNTER — Encounter (HOSPITAL_BASED_OUTPATIENT_CLINIC_OR_DEPARTMENT_OTHER): Payer: Self-pay

## 2023-11-06 DIAGNOSIS — Z91040 Latex allergy status: Secondary | ICD-10-CM | POA: Insufficient documentation

## 2023-11-06 DIAGNOSIS — Z79899 Other long term (current) drug therapy: Secondary | ICD-10-CM | POA: Insufficient documentation

## 2023-11-06 DIAGNOSIS — M545 Low back pain, unspecified: Secondary | ICD-10-CM | POA: Diagnosis present

## 2023-11-06 DIAGNOSIS — E039 Hypothyroidism, unspecified: Secondary | ICD-10-CM | POA: Diagnosis not present

## 2023-11-06 DIAGNOSIS — M5441 Lumbago with sciatica, right side: Secondary | ICD-10-CM | POA: Insufficient documentation

## 2023-11-06 DIAGNOSIS — M5431 Sciatica, right side: Secondary | ICD-10-CM

## 2023-11-06 MED ORDER — OXYCODONE-ACETAMINOPHEN 5-325 MG PO TABS: 1.0000 | ORAL_TABLET | Freq: Four times a day (QID) | ORAL | 0 refills | Status: DC | PRN

## 2023-11-06 MED ORDER — HYDROMORPHONE HCL 1 MG/ML IJ SOLN
2.0000 mg | Freq: Once | INTRAMUSCULAR | Status: AC
  Administered 2023-11-06: 2 mg via INTRAMUSCULAR
  Filled 2023-11-06: qty 2

## 2023-11-06 NOTE — ED Notes (Cosign Needed)
9:28 AM Called by [facility] pharmacy --patient was seen in the emergency department last night.  Chart reviewed.  Oxycodone prescription was sent in.  Received phone call regarding patient having a monthly prescription for hydrocodone 10 mg - 325 mg.  Asking if they should fill this.  Given that patient is already on chronic narcotics, and has had recent fill on January 29 for 1 month, do not feel that it would be safe to fill additional prescription.   Renne Crigler, PA-C 11/06/23 0930

## 2023-11-06 NOTE — Discharge Instructions (Signed)
Begin taking Percocet as prescribed as needed for pain.  Stop taking hydrocodone while taking this medication.  Rest.  Follow-up with primary doctor if not improving in the next week.

## 2023-11-06 NOTE — ED Triage Notes (Signed)
Exacerbation of chronic Lower back pain with right leg pain

## 2023-11-06 NOTE — ED Provider Notes (Signed)
[place] [facility] DEPARTMENT AT [facility] Provider Note   CSN: [id number] Arrival date & time: 11/06/23  0446     History  Chief Complaint  Patient presents with   Back Pain   Leg Pain    Exacerbation of chronic Lower back pain with right leg pain    Marilyn [name] is a 53 y.o. female.  Patient is a 53 year old female with past medical history of spinal stenosis with nerve stimulator, morbid obesity, hypothyroidism.  Patient presenting today with complaints of severe pain in the back of her right leg and buttock consistent with prior episodes of sciatica.  She denies any injury or trauma.  She was started on prednisone over the weekend by her primary doctor and also given a shot of Toradol yesterday.  Pain became more severe this evening and having difficulty sleeping.  No bowel or bladder complaints.  No weakness or numbness.  The history is provided by the patient.       Home Medications Prior to Admission medications   Medication Sig Start Date End Date Taking? Authorizing Provider  albuterol (VENTOLIN HFA) 108 (90 Base) MCG/ACT inhaler Inhale 1-2 puffs into the lungs every 6 (six) hours as needed for wheezing or shortness of breath. 12/09/19   Henderly, Britni A, PA-C  allopurinol (ZYLOPRIM) 100 MG tablet Take 1 tablet (100 mg total) by mouth 2 (two) times daily. Patient not taking: Reported on 10/12/2023 08/14/23 11/12/23  Cammy Copa, MD  amitriptyline (ELAVIL) 25 MG tablet Take 25 mg by mouth at bedtime.    [provider]  budesonide (PULMICORT) 0.5 MG/2ML nebulizer solution Inhale into the lungs. 01/15/23   [provider]  buPROPion (WELLBUTRIN XL) 150 MG 24 hr tablet Take 150 mg by mouth daily. Take with 300 mg to equal 450 08/27/23 11/25/23  [provider]  buPROPion (WELLBUTRIN XL) 300 MG 24 hr tablet Take 300 mg by mouth daily. Take with 150 mg to equal 450 mg 08/27/23 11/25/23  [provider]  Cetirizine HCl  (ZYRTEC ALLERGY) 10 MG CAPS Take by mouth. 04/30/23   [provider]  cyclobenzaprine (FLEXERIL) 10 MG tablet Take 1 tablet by mouth 3 (three) times daily as needed. 01/28/23   [provider]  cycloSPORINE (RESTASIS) 0.05 % ophthalmic emulsion Place 1 drop into both eyes 2 (two) times daily as needed (dry eyes).    [provider]  DULoxetine (CYMBALTA) 60 MG capsule Take 60 mg by mouth 2 (two) times daily. 05/02/21   [provider]  Ferrous Gluconate 324 (37.5 Fe) MG TABS Take 324 mg by mouth daily. 02/14/20   [provider]  hydrochlorothiazide (HYDRODIURIL) 25 MG tablet Take 25 mg by mouth daily.    [provider]  HYDROcodone-acetaminophen (NORCO) 10-325 MG tablet Take 2 tablets by mouth 2 (two) times daily. 10/13/23 11/12/23  [provider]  HYDROcodone-acetaminophen (NORCO/VICODIN) 5-325 MG tablet Take 1 tablet by mouth every 8 (eight) hours as needed. Patient not taking: Reported on 10/12/2023 01/20/23   [provider]  ipratropium-albuterol (DUONEB) 0.5-2.5 (3) MG/3ML SOLN Inhale into the lungs. 02/15/23   [provider]  levothyroxine (SYNTHROID) 150 MCG tablet Take 150 mcg by mouth daily before breakfast. 05/18/19   [provider]  naloxone (NARCAN) nasal spray 4 mg/0.1 mL Place into the nose. 08/21/22   [provider]  nystatin (MYCOSTATIN/NYSTOP) powder Apply 1 Application topically 3 (three) times daily. 11/06/22   [provider]  omeprazole (PRILOSEC) 40  MG capsule Take 40 mg by mouth 2 (two) times daily.    [provider]  oxybutynin (DITROPAN XL) 15 MG 24 hr tablet Take 15 mg by mouth daily.  05/31/17   [provider]  pilocarpine (SALAGEN) 5 MG tablet Take 5 mg by mouth 3 (three) times daily. 08/24/23   [provider]  pregabalin (LYRICA) 75 MG capsule Take 1 capsule (75 mg total) by mouth 2 (two) times daily. 08/07/22   London Sheer, MD   topiramate (TOPAMAX) 25 MG tablet Take 25 mg by mouth daily.    [provider]  traZODone (DESYREL) 50 MG tablet Take 50-100 mg by mouth at bedtime.    [provider]  vitamin B-12 (CYANOCOBALAMIN) 250 MCG tablet Take 250 mcg by mouth daily.    [provider]      Allergies    Lisinopril, Bee venom, Bupropion, Latex, Propoxyphene, Methadone, Codeine, Meloxicam, Morphine and codeine, Tegaderm ag mesh [silver], and Tomato    Review of Systems   Review of Systems  All other systems reviewed and are negative.   Physical Exam Updated Vital Signs BP (!) 166/74 (BP Location: Right Arm)   Pulse 66   Temp 98 F (36.7 C) (Oral)   Ht 5\' 2"  (1.575 m)   Wt (!) 166.4 kg   LMP  (LMP Unknown) Comment: tubal ligation  SpO2 100%   BMI 67.10 kg/m  Physical Exam Vitals and nursing note reviewed.  Constitutional:      General: She is not in acute distress.    Appearance: She is well-developed. She is not diaphoretic.  HENT:     Head: Normocephalic and atraumatic.  Cardiovascular:     Rate and Rhythm: Normal rate and regular rhythm.     Heart sounds: No murmur heard.    No friction rub. No gallop.  Pulmonary:     Effort: Pulmonary effort is normal. No respiratory distress.     Breath sounds: Normal breath sounds. No wheezing.  Abdominal:     General: Bowel sounds are normal. There is no distension.     Palpations: Abdomen is soft.     Tenderness: There is no abdominal tenderness.  Musculoskeletal:        General: Normal range of motion.     Cervical back: Normal range of motion and neck supple.  Skin:    General: Skin is warm and dry.  Neurological:     General: No focal deficit present.     Mental Status: She is alert and oriented to person, place, and time.     Comments: Strength is 5 out of 5 in both lower extremities.  Sensation is intact throughout both lower extremities.     ED Results / Procedures / Treatments   Labs (all labs ordered are  listed, but only abnormal results are displayed) Labs Reviewed - No data to display  EKG None  Radiology No results found.  Procedures Procedures    Medications Ordered in ED Medications  HYDROmorphone (DILAUDID) injection 2 mg (has no administration in time range)    ED Course/ Medical Decision Making/ A&P  Patient is a 53 year old female presenting with complaints of sciatica.  She has had a history of this in the past.  She arrives with stable vital signs and is neurologically intact.  Patient given IM Dilaudid with good results.  She will be discharged with Percocet, continued prednisone, and return as needed.  No red flags that would suggest an emergent situation  such as bowel or bladder complaints, weakness, or numbness.  Final Clinical Impression(s) / ED Diagnoses Final diagnoses:  None    Rx / DC Orders ED Discharge Orders     None         Geoffery Lyons, MD 11/06/23 [phone]

## 2023-11-07 NOTE — Pre-Procedure Instructions (Signed)
Surgical Instructions   Your procedure is scheduled on Thursday, February 27th. Report to [facility] Main Entrance "A" at 05:30 A.M., then check in with the Admitting office. Any questions or running late day of surgery: call [phone]  Questions prior to your surgery date: call [phone], Monday-Friday, 8am-4pm. If you experience any cold or flu symptoms such as cough, fever, chills, shortness of breath, etc. between now and your scheduled surgery, please notify us at the above number.     Remember:  Do not eat after midnight the night before your surgery  You may drink clear liquids until 04:30 AM the morning of your surgery.   Clear liquids allowed are: Water, Non-Citrus Juices (without pulp), Carbonated Beverages, Clear Tea (no milk, honey, etc.), Black Coffee Only (NO MILK, CREAM OR POWDERED CREAMER of any kind), and Gatorade.  Patient Instructions  The night before surgery:  No food after midnight. ONLY clear liquids after midnight  The day of surgery (if you do NOT have diabetes):  Drink ONE (1) Pre-Surgery Clear Ensure by 04:30 AM the morning of surgery. Drink in one sitting. Do not sip.  This drink was given to you during your hospital  pre-op appointment visit.  Nothing else to drink after completing the  Pre-Surgery Clear Ensure.          If you have questions, please contact your surgeon's office.    Take these medicines the morning of surgery with A SIP OF WATER  budesonide (PULMICORT)  buPROPion (WELLBUTRIN XL)  Cetirizine HCl (ZYRTEC ALLERGY)  DULoxetine (CYMBALTA)  HYDROcodone-acetaminophen (NORCO)  ipratropium-albuterol (DUONEB)  levothyroxine (SYNTHROID)  omeprazole (PRILOSEC)  oxybutynin (DITROPAN XL)  pilocarpine (SALAGEN)  pregabalin (LYRICA)  topiramate (TOPAMAX)    May take these medicines IF NEEDED: albuterol (VENTOLIN HFA)- bring inhaler with you cyclobenzaprine (FLEXERIL)  cycloSPORINE (RESTASIS) eye drops oxyCODONE-acetaminophen  (PERCOCET)    One week prior to surgery, STOP taking any Aspirin (unless otherwise instructed by your surgeon) Aleve, Naproxen, Ibuprofen, Motrin, Advil, Goody's, BC's, all herbal medications, fish oil, and non-prescription vitamins.                     Do NOT Smoke (Tobacco/Vaping) for 24 hours prior to your procedure.  If you use a CPAP at night, you may bring your mask/headgear for your overnight stay.   You will be asked to remove any contacts, glasses, piercing's, hearing aid's, dentures/partials prior to surgery. Please bring cases for these items if needed.    Patients discharged the day of surgery will not be allowed to drive home, and someone needs to stay with them for 24 hours.  SURGICAL WAITING ROOM VISITATION Patients may have no more than 2 support people in the waiting area - these visitors may rotate.   Pre-op nurse will coordinate an appropriate time for 1 ADULT support person, who may not rotate, to accompany patient in pre-op.  Children under the age of 65 must have an adult with them who is not the patient and must remain in the main waiting area with an adult.  If the patient needs to stay at the hospital during part of their recovery, the visitor guidelines for inpatient rooms apply.  Please refer to the [facility] website for the visitor guidelines for any additional information.   If you received a COVID test during your pre-op visit  it is requested that you wear a mask when out in public, stay away from anyone that may not be feeling well and  notify your surgeon if you develop symptoms. If you have been in contact with anyone that has tested positive in the last 10 days please notify you surgeon.      Pre-operative 5 CHG Bathing Instructions   You can play a key role in reducing the risk of infection after surgery. Your skin needs to be as free of germs as possible. You can reduce the number of germs on your skin by washing with CHG (chlorhexidine gluconate)  soap before surgery. CHG is an antiseptic soap that kills germs and continues to kill germs even after washing.   DO NOT use if you have an allergy to chlorhexidine/CHG or antibacterial soaps. If your skin becomes reddened or irritated, stop using the CHG and notify one of our RNs at [phone].   Please shower with the CHG soap starting 4 days before surgery using the following schedule:     Please keep in mind the following:  DO NOT shave, including legs and underarms, starting the day of your first shower.   You may shave your face at any point before/day of surgery.  Place clean sheets on your bed the day you start using CHG soap. Use a clean washcloth (not used since being washed) for each shower. DO NOT sleep with pets once you start using the CHG.   CHG Shower Instructions:  Wash your face and private area with normal soap. If you choose to wash your hair, wash first with your normal shampoo.  After you use shampoo/soap, rinse your hair and body thoroughly to remove shampoo/soap residue.  Turn the water OFF and apply about 3 tablespoons (45 ml) of CHG soap to a CLEAN washcloth.  Apply CHG soap ONLY FROM YOUR NECK DOWN TO YOUR TOES (washing for 3-5 minutes)  DO NOT use CHG soap on face, private areas, open wounds, or sores.  Pay special attention to the area where your surgery is being performed.  If you are having back surgery, having someone wash your back for you may be helpful. Wait 2 minutes after CHG soap is applied, then you may rinse off the CHG soap.  Pat dry with a clean towel  Put on clean clothes/pajamas   If you choose to wear lotion, please use ONLY the CHG-compatible lotions that are listed below.  Additional instructions for the day of surgery: DO NOT APPLY any lotions, deodorants, cologne, or perfumes.   Do not bring valuables to the hospital. [facility] is not responsible for any belongings/valuables. Do not wear nail polish, gel polish, artificial nails, or  any other type of covering on natural nails (fingers and toes) Do not wear jewelry or makeup Put on clean/comfortable clothes.  Please brush your teeth.  Ask your nurse before applying any prescription medications to the skin.     CHG Compatible Lotions   Aveeno Moisturizing lotion  Cetaphil Moisturizing Cream  Cetaphil Moisturizing Lotion  Clairol Herbal Essence Moisturizing Lotion, Dry Skin  Clairol Herbal Essence Moisturizing Lotion, Extra Dry Skin  Clairol Herbal Essence Moisturizing Lotion, Normal Skin  Curel Age Defying Therapeutic Moisturizing Lotion with Alpha Hydroxy  Curel Extreme Care Body Lotion  Curel Soothing Hands Moisturizing Hand Lotion  Curel Therapeutic Moisturizing Cream, Fragrance-Free  Curel Therapeutic Moisturizing Lotion, Fragrance-Free  Curel Therapeutic Moisturizing Lotion, Original Formula  Eucerin Daily Replenishing Lotion  Eucerin Dry Skin Therapy Plus Alpha Hydroxy Crme  Eucerin Dry Skin Therapy Plus Alpha Hydroxy Lotion  Eucerin Original Crme  Eucerin Original Lotion  Eucerin Plus Crme  Eucerin Plus Lotion  Eucerin TriLipid Replenishing Lotion  Keri Anti-Bacterial Hand Lotion  Keri Deep Conditioning Original Lotion Dry Skin Formula Softly Scented  Keri Deep Conditioning Original Lotion, Fragrance Free Sensitive Skin Formula  Keri Lotion Fast Absorbing Fragrance Free Sensitive Skin Formula  Keri Lotion Fast Absorbing Softly Scented Dry Skin Formula  Keri Original Lotion  Keri Skin Renewal Lotion Keri Silky Smooth Lotion  Keri Silky Smooth Sensitive Skin Lotion  Nivea Body Creamy Conditioning Oil  Nivea Body Extra Enriched Lotion  Nivea Body Original Lotion  Nivea Body Sheer Moisturizing Lotion Nivea Crme  Nivea Skin Firming Lotion  NutraDerm 30 Skin Lotion  NutraDerm Skin Lotion  NutraDerm Therapeutic Skin Cream  NutraDerm Therapeutic Skin Lotion  ProShield Protective Hand Cream  Provon moisturizing lotion  Please read over the  following fact sheets that you were given.    [place]- Preparing for Total Shoulder Arthroplasty  Before surgery, you can play an important role. Because skin is not sterile, your skin needs to be as free of germs as possible. You can reduce the number of germs on your skin by using the following products.   Benzoyl Peroxide Gel  o Reduces the number of germs present on the skin  o Applied twice a day to shoulder area starting two days before surgery   ==================================================================  Please follow these instructions carefully:  BENZOYL PEROXIDE 5% GEL  Please do not use if you have an allergy to benzoyl peroxide. If your skin becomes reddened/irritated stop using the benzoyl peroxide.  Starting two days before surgery, apply as follows:  1. Apply benzoyl peroxide in the morning and at night. Apply after taking a shower. If you are not taking a shower clean entire shoulder front, back, and side along with the armpit with a clean wet washcloth.  2. Place a quarter-sized dollop on your SHOULDER and rub in thoroughly, making sure to cover the front, back, and side of your shoulder, along with the armpit.   2 Days prior to Surgery First Dose on _____________ Morning Second Dose on ______________ Night  Day Before Surgery First Dose on ______________ Morning Night before surgery wash (entire body except face and private areas) with CHG Soap THEN Second Dose on ____________ Night   Morning of Surgery  wash BODY AGAIN with CHG Soap   4. Do NOT apply benzoyl peroxide gel on the day of surgery

## 2023-11-08 ENCOUNTER — Encounter (HOSPITAL_COMMUNITY)
Admission: RE | Admit: 2023-11-08 | Discharge: 2023-11-08 | Disposition: A | Discharge status: Home / Self Care | Payer: Medicare HMO | Source: Ambulatory Visit | Attending: Orthopedic Surgery | Admitting: Orthopedic Surgery

## 2023-11-08 ENCOUNTER — Other Ambulatory Visit: Payer: Self-pay

## 2023-11-08 ENCOUNTER — Encounter (HOSPITAL_COMMUNITY): Payer: Self-pay

## 2023-11-08 VITALS — BP 170/77 | HR 67 | Temp 97.9°F | Resp 17 | Ht 62.0 in | Wt 339.5 lb

## 2023-11-08 DIAGNOSIS — Z01818 Encounter for other preprocedural examination: Secondary | ICD-10-CM | POA: Insufficient documentation

## 2023-11-08 DIAGNOSIS — Z01810 Encounter for preprocedural cardiovascular examination: Secondary | ICD-10-CM | POA: Diagnosis present

## 2023-11-08 DIAGNOSIS — G9341 Metabolic encephalopathy: Secondary | ICD-10-CM | POA: Insufficient documentation

## 2023-11-08 DIAGNOSIS — Z01812 Encounter for preprocedural laboratory examination: Secondary | ICD-10-CM | POA: Diagnosis present

## 2023-11-08 HISTORY — DX: Presence of other specified functional implants: Z96.89

## 2023-11-08 LAB — CBC
HCT: 37.8 % (ref 36.0–46.0)
Hemoglobin: 11.9 g/dL — ABNORMAL LOW (ref 12.0–15.0)
MCH: 28.7 pg (ref 26.0–34.0)
MCHC: 31.5 g/dL (ref 30.0–36.0)
MCV: 91.1 fL (ref 80.0–100.0)
Platelets: 264 10*3/uL (ref 150–400)
RBC: 4.15 MIL/uL (ref 3.87–5.11)
RDW: 13.8 % (ref 11.5–15.5)
WBC: 9.6 10*3/uL (ref 4.0–10.5)
nRBC: 0 % (ref 0.0–0.2)

## 2023-11-08 LAB — SURGICAL PCR SCREEN
MRSA, PCR: POSITIVE — AB
Staphylococcus aureus: POSITIVE — AB

## 2023-11-08 LAB — URINALYSIS, W/ REFLEX TO CULTURE (INFECTION SUSPECTED)
Bacteria, UA: NONE SEEN
Bilirubin Urine: NEGATIVE
Glucose, UA: NEGATIVE mg/dL
Hgb urine dipstick: NEGATIVE
Ketones, ur: NEGATIVE mg/dL
Leukocytes,Ua: NEGATIVE
Nitrite: NEGATIVE
Protein, ur: NEGATIVE mg/dL
Specific Gravity, Urine: 1.021 (ref 1.005–1.030)
pH: 5 (ref 5.0–8.0)

## 2023-11-08 LAB — BASIC METABOLIC PANEL
Anion gap: 8 (ref 5–15)
BUN: 22 mg/dL — ABNORMAL HIGH (ref 6–20)
CO2: 27 mmol/L (ref 22–32)
Calcium: 9.2 mg/dL (ref 8.9–10.3)
Chloride: 102 mmol/L (ref 98–111)
Creatinine, Ser: 1.15 mg/dL — ABNORMAL HIGH (ref 0.44–1.00)
GFR, Estimated: 57 mL/min — ABNORMAL LOW (ref >60)
Glucose, Bld: 82 mg/dL (ref 70–99)
Potassium: 3.1 mmol/L — ABNORMAL LOW (ref 3.5–5.1)
Sodium: 137 mmol/L (ref 135–145)

## 2023-11-08 NOTE — Progress Notes (Signed)
PCP - Sharon Seller Cardiologist - denies  PPM/ICD - denies Device Orders - n/a Rep Notified - n/a  Patient states that she has a spinal cord stimulator [employer]) - patient was instructed to bring remote with her.  Patient verbalizes understanding.    Chest x-ray - denies EKG - 11/08/23 Stress Test - denies ECHO - 10/10/22 Cardiac Cath - denies  Sleep Study - OSA+ - wears CPAP but does not know settings  No DM  Last dose of GLP1 agonist-  n/a GLP1 instructions: n/a  Blood Thinner Instructions: n/a Aspirin Instructions: n/a  ERAS Protcol - clears until 0430 PRE-SURGERY Ensure or G2- Ensure as ordered  COVID TEST- n/a   Anesthesia review: no  Patient denies shortness of breath, fever, cough and chest pain at PAT appointment   All instructions explained to the patient, with a verbal understanding of the material. Patient agrees to go over the instructions while at home for a better understanding. Patient also instructed to self quarantine after being tested for COVID-19. The opportunity to ask questions was provided.

## 2023-11-08 NOTE — Progress Notes (Signed)
Luke notified of patient's positive surgical PCR and states that he will order Vanc for day of surgery.

## 2023-11-14 ENCOUNTER — Observation Stay (HOSPITAL_BASED_OUTPATIENT_CLINIC_OR_DEPARTMENT_OTHER): Payer: Medicare HMO | Admitting: Anesthesiology

## 2023-11-14 ENCOUNTER — Other Ambulatory Visit: Payer: Self-pay

## 2023-11-14 ENCOUNTER — Encounter (HOSPITAL_COMMUNITY): Payer: Self-pay | Admitting: Orthopedic Surgery

## 2023-11-14 ENCOUNTER — Observation Stay (HOSPITAL_COMMUNITY): Payer: Medicare HMO

## 2023-11-14 ENCOUNTER — Encounter (HOSPITAL_COMMUNITY)
Admission: RE | Disposition: A | Discharge status: Home Health | Payer: Self-pay | Source: Home / Self Care | Attending: Orthopedic Surgery

## 2023-11-14 ENCOUNTER — Observation Stay (HOSPITAL_COMMUNITY): Payer: Medicare HMO | Admitting: Anesthesiology

## 2023-11-14 ENCOUNTER — Observation Stay (HOSPITAL_COMMUNITY)
Admission: RE | Admit: 2023-11-14 | Discharge: 2023-11-16 | Disposition: A | Discharge status: Home Health | Payer: Medicare HMO | Attending: Orthopedic Surgery | Admitting: Orthopedic Surgery

## 2023-11-14 DIAGNOSIS — Z87891 Personal history of nicotine dependence: Secondary | ICD-10-CM

## 2023-11-14 DIAGNOSIS — J45909 Unspecified asthma, uncomplicated: Secondary | ICD-10-CM | POA: Diagnosis not present

## 2023-11-14 DIAGNOSIS — Z96642 Presence of left artificial hip joint: Secondary | ICD-10-CM | POA: Diagnosis not present

## 2023-11-14 DIAGNOSIS — Z96612 Presence of left artificial shoulder joint: Secondary | ICD-10-CM | POA: Diagnosis not present

## 2023-11-14 DIAGNOSIS — Z96651 Presence of right artificial knee joint: Secondary | ICD-10-CM | POA: Insufficient documentation

## 2023-11-14 DIAGNOSIS — Z79899 Other long term (current) drug therapy: Secondary | ICD-10-CM | POA: Diagnosis not present

## 2023-11-14 DIAGNOSIS — M19011 Primary osteoarthritis, right shoulder: Principal | ICD-10-CM

## 2023-11-14 DIAGNOSIS — E039 Hypothyroidism, unspecified: Secondary | ICD-10-CM | POA: Insufficient documentation

## 2023-11-14 DIAGNOSIS — Z96611 Presence of right artificial shoulder joint: Secondary | ICD-10-CM

## 2023-11-14 DIAGNOSIS — I10 Essential (primary) hypertension: Secondary | ICD-10-CM

## 2023-11-14 DIAGNOSIS — G9341 Metabolic encephalopathy: Principal | ICD-10-CM

## 2023-11-14 DIAGNOSIS — M19019 Primary osteoarthritis, unspecified shoulder: Secondary | ICD-10-CM | POA: Diagnosis present

## 2023-11-14 HISTORY — PX: REVERSE SHOULDER ARTHROPLASTY: SHX5054

## 2023-11-14 SURGERY — ARTHROPLASTY, SHOULDER, TOTAL, REVERSE
Anesthesia: General | Site: Shoulder | Laterality: Right

## 2023-11-14 MED ORDER — ROCURONIUM BROMIDE 10 MG/ML (PF) SYRINGE
PREFILLED_SYRINGE | INTRAVENOUS | Status: AC
  Filled 2023-11-14: qty 10

## 2023-11-14 MED ORDER — DEXAMETHASONE SODIUM PHOSPHATE 10 MG/ML IJ SOLN
INTRAMUSCULAR | Status: DC | PRN
  Administered 2023-11-14: 5 mg via INTRAVENOUS

## 2023-11-14 MED ORDER — SUGAMMADEX SODIUM 200 MG/2ML IV SOLN
INTRAVENOUS | Status: DC | PRN
  Administered 2023-11-14 (×2): 100 mg via INTRAVENOUS

## 2023-11-14 MED ORDER — BUPIVACAINE HCL (PF) 0.25 % IJ SOLN
INTRAMUSCULAR | Status: AC
  Filled 2023-11-14: qty 30

## 2023-11-14 MED ORDER — KETOROLAC TROMETHAMINE 30 MG/ML IJ SOLN
INTRAMUSCULAR | Status: AC
  Administered 2023-11-14: 30 mg
  Filled 2023-11-14: qty 1

## 2023-11-14 MED ORDER — ACETAMINOPHEN 160 MG/5ML PO SOLN: 325.0000 mg | Freq: Once | ORAL | Status: DC | PRN

## 2023-11-14 MED ORDER — HYDROMORPHONE HCL 1 MG/ML IJ SOLN
0.5000 mg | INTRAMUSCULAR | Status: DC | PRN
  Administered 2023-11-15: 1 mg via INTRAVENOUS
  Administered 2023-11-15: 0.5 mg via INTRAVENOUS
  Administered 2023-11-16 (×2): 1 mg via INTRAVENOUS
  Filled 2023-11-14 (×4): qty 1

## 2023-11-14 MED ORDER — FUROSEMIDE 20 MG PO TABS: 20.0000 mg | ORAL_TABLET | Freq: Two times a day (BID) | ORAL | Status: DC | PRN

## 2023-11-14 MED ORDER — HYDROCHLOROTHIAZIDE 25 MG PO TABS
25.0000 mg | ORAL_TABLET | Freq: Every day | ORAL | Status: DC
  Administered 2023-11-15 – 2023-11-16 (×2): 25 mg via ORAL
  Filled 2023-11-14 (×2): qty 1

## 2023-11-14 MED ORDER — CELECOXIB 100 MG PO CAPS
100.0000 mg | ORAL_CAPSULE | Freq: Two times a day (BID) | ORAL | Status: DC
  Administered 2023-11-14 – 2023-11-16 (×5): 100 mg via ORAL
  Filled 2023-11-14 (×6): qty 1

## 2023-11-14 MED ORDER — DEXAMETHASONE SODIUM PHOSPHATE 10 MG/ML IJ SOLN
INTRAMUSCULAR | Status: AC
  Filled 2023-11-14: qty 1

## 2023-11-14 MED ORDER — 0.9 % SODIUM CHLORIDE (POUR BTL) OPTIME
TOPICAL | Status: DC | PRN
  Administered 2023-11-14 (×5): 1000 mL

## 2023-11-14 MED ORDER — IRRISEPT - 450ML BOTTLE WITH 0.05% CHG IN STERILE WATER, USP 99.95% OPTIME
TOPICAL | Status: DC | PRN
  Administered 2023-11-14: 450 mL

## 2023-11-14 MED ORDER — ONDANSETRON HCL 4 MG/2ML IJ SOLN
INTRAMUSCULAR | Status: DC | PRN
  Administered 2023-11-14: 4 mg via INTRAVENOUS

## 2023-11-14 MED ORDER — PHENYLEPHRINE HCL-NACL 20-0.9 MG/250ML-% IV SOLN
INTRAVENOUS | Status: DC | PRN
  Administered 2023-11-14: 40 ug/min via INTRAVENOUS

## 2023-11-14 MED ORDER — POVIDONE-IODINE 7.5 % EX SOLN
Freq: Once | CUTANEOUS | Status: DC
  Filled 2023-11-14: qty 118

## 2023-11-14 MED ORDER — EPHEDRINE 5 MG/ML INJ
INTRAVENOUS | Status: AC
  Filled 2023-11-14: qty 5

## 2023-11-14 MED ORDER — HYDROMORPHONE HCL 1 MG/ML IJ SOLN
INTRAMUSCULAR | Status: AC
  Filled 2023-11-14: qty 1

## 2023-11-14 MED ORDER — CHLORHEXIDINE GLUCONATE 0.12 % MT SOLN
15.0000 mL | Freq: Once | OROMUCOSAL | Status: AC
  Administered 2023-11-14: 15 mL via OROMUCOSAL
  Filled 2023-11-14: qty 15

## 2023-11-14 MED ORDER — CLONIDINE HCL (ANALGESIA) 100 MCG/ML EP SOLN
EPIDURAL | Status: AC
  Filled 2023-11-14: qty 10

## 2023-11-14 MED ORDER — CEFAZOLIN SODIUM 1 G IJ SOLR
INTRAMUSCULAR | Status: AC
  Filled 2023-11-14: qty 10

## 2023-11-14 MED ORDER — ALLOPURINOL 100 MG PO TABS
100.0000 mg | ORAL_TABLET | Freq: Two times a day (BID) | ORAL | Status: DC
  Administered 2023-11-14 – 2023-11-16 (×4): 100 mg via ORAL
  Filled 2023-11-14 (×4): qty 1

## 2023-11-14 MED ORDER — LACTATED RINGERS IV SOLN
INTRAVENOUS | Status: DC
Start: 2023-11-14 — End: 2023-11-14

## 2023-11-14 MED ORDER — METHOCARBAMOL 500 MG PO TABS
500.0000 mg | ORAL_TABLET | Freq: Four times a day (QID) | ORAL | Status: DC | PRN
  Filled 2023-11-14: qty 1

## 2023-11-14 MED ORDER — HYDROMORPHONE HCL 1 MG/ML IJ SOLN: 0.5000 mg | INTRAMUSCULAR | Status: DC | PRN

## 2023-11-14 MED ORDER — LACTATED RINGERS IV SOLN: INTRAVENOUS | Status: DC

## 2023-11-14 MED ORDER — SUGAMMADEX SODIUM 200 MG/2ML IV SOLN
INTRAVENOUS | Status: AC
  Filled 2023-11-14: qty 2

## 2023-11-14 MED ORDER — MEPERIDINE HCL 25 MG/ML IJ SOLN: 6.2500 mg | INTRAMUSCULAR | Status: DC | PRN

## 2023-11-14 MED ORDER — ACETAMINOPHEN 325 MG PO TABS
325.0000 mg | ORAL_TABLET | Freq: Four times a day (QID) | ORAL | Status: DC | PRN
  Administered 2023-11-16: 650 mg via ORAL
  Filled 2023-11-14: qty 2

## 2023-11-14 MED ORDER — SODIUM CHLORIDE (PF) 0.9 % IJ SOLN: INTRAMUSCULAR | Status: DC | PRN

## 2023-11-14 MED ORDER — POVIDONE-IODINE 10 % EX SWAB
2.0000 | Freq: Once | CUTANEOUS | Status: AC
  Administered 2023-11-14: 2 via TOPICAL

## 2023-11-14 MED ORDER — BUPROPION HCL ER (XL) 300 MG PO TB24
300.0000 mg | ORAL_TABLET | Freq: Every day | ORAL | Status: DC
  Administered 2023-11-15 – 2023-11-16 (×2): 300 mg via ORAL
  Filled 2023-11-14 (×2): qty 1

## 2023-11-14 MED ORDER — ACETAMINOPHEN 500 MG PO TABS
1000.0000 mg | ORAL_TABLET | Freq: Four times a day (QID) | ORAL | Status: AC
  Administered 2023-11-15 (×3): 1000 mg via ORAL
  Filled 2023-11-14 (×4): qty 2

## 2023-11-14 MED ORDER — PROPOFOL 10 MG/ML IV BOLUS
INTRAVENOUS | Status: AC
  Filled 2023-11-14: qty 20

## 2023-11-14 MED ORDER — ALBUTEROL SULFATE HFA 108 (90 BASE) MCG/ACT IN AERS: 1.0000 | INHALATION_SPRAY | Freq: Four times a day (QID) | RESPIRATORY_TRACT | Status: DC | PRN

## 2023-11-14 MED ORDER — DROPERIDOL 2.5 MG/ML IJ SOLN: 0.6250 mg | Freq: Once | INTRAMUSCULAR | Status: DC | PRN

## 2023-11-14 MED ORDER — ONDANSETRON HCL 4 MG PO TABS: 4.0000 mg | ORAL_TABLET | Freq: Four times a day (QID) | ORAL | Status: DC | PRN

## 2023-11-14 MED ORDER — CEFAZOLIN SODIUM-DEXTROSE 3-4 GM/150ML-% IV SOLN
3.0000 g | INTRAVENOUS | Status: AC
Start: 2023-11-14 — End: 2023-11-14
  Administered 2023-11-14 (×2): 3 g via INTRAVENOUS
  Filled 2023-11-14: qty 150

## 2023-11-14 MED ORDER — EPHEDRINE SULFATE-NACL 50-0.9 MG/10ML-% IV SOSY
PREFILLED_SYRINGE | INTRAVENOUS | Status: DC | PRN
  Administered 2023-11-14 (×5): 5 mg via INTRAVENOUS

## 2023-11-14 MED ORDER — ROCURONIUM BROMIDE 10 MG/ML (PF) SYRINGE
PREFILLED_SYRINGE | INTRAVENOUS | Status: DC | PRN
  Administered 2023-11-14: 50 mg via INTRAVENOUS

## 2023-11-14 MED ORDER — MORPHINE SULFATE (PF) 4 MG/ML IV SOLN
INTRAVENOUS | Status: AC
  Filled 2023-11-14: qty 2

## 2023-11-14 MED ORDER — IPRATROPIUM-ALBUTEROL 0.5-2.5 (3) MG/3ML IN SOLN: 3.0000 mL | RESPIRATORY_TRACT | Status: DC | PRN

## 2023-11-14 MED ORDER — ALBUMIN HUMAN 5 % IV SOLN: INTRAVENOUS | Status: DC | PRN

## 2023-11-14 MED ORDER — ACETAMINOPHEN 10 MG/ML IV SOLN
1000.0000 mg | Freq: Once | INTRAVENOUS | Status: DC | PRN
  Administered 2023-11-14: 1000 mg via INTRAVENOUS

## 2023-11-14 MED ORDER — CEFAZOLIN SODIUM-DEXTROSE 2-4 GM/100ML-% IV SOLN
INTRAVENOUS | Status: AC
  Filled 2023-11-14: qty 100

## 2023-11-14 MED ORDER — SUCCINYLCHOLINE CHLORIDE 200 MG/10ML IV SOSY
PREFILLED_SYRINGE | INTRAVENOUS | Status: AC
  Filled 2023-11-14: qty 10

## 2023-11-14 MED ORDER — PILOCARPINE HCL 5 MG PO TABS
5.0000 mg | ORAL_TABLET | Freq: Three times a day (TID) | ORAL | Status: DC
  Administered 2023-11-14 – 2023-11-16 (×7): 5 mg via ORAL
  Filled 2023-11-14 (×8): qty 1

## 2023-11-14 MED ORDER — BUPIVACAINE LIPOSOME 1.3 % IJ SUSP
INTRAMUSCULAR | Status: DC | PRN
  Administered 2023-11-14: 40 mL

## 2023-11-14 MED ORDER — MORPHINE SULFATE (PF) 4 MG/ML IV SOLN
INTRAVENOUS | Status: AC
  Filled 2023-11-14: qty 1

## 2023-11-14 MED ORDER — MIDAZOLAM HCL 2 MG/2ML IJ SOLN
INTRAMUSCULAR | Status: AC
  Filled 2023-11-14: qty 2

## 2023-11-14 MED ORDER — TRANEXAMIC ACID-NACL 1000-0.7 MG/100ML-% IV SOLN
1000.0000 mg | INTRAVENOUS | Status: AC
Start: 2023-11-14 — End: 2023-11-14
  Administered 2023-11-14: 1000 mg via INTRAVENOUS
  Filled 2023-11-14: qty 100

## 2023-11-14 MED ORDER — LABETALOL HCL 5 MG/ML IV SOLN: 5.0000 mg | INTRAVENOUS | Status: DC | PRN

## 2023-11-14 MED ORDER — PANTOPRAZOLE SODIUM 40 MG PO TBEC
80.0000 mg | DELAYED_RELEASE_TABLET | Freq: Every day | ORAL | Status: DC
  Administered 2023-11-15 – 2023-11-16 (×2): 80 mg via ORAL
  Filled 2023-11-14 (×2): qty 2

## 2023-11-14 MED ORDER — ACETAMINOPHEN 325 MG PO TABS: 325.0000 mg | ORAL_TABLET | Freq: Once | ORAL | Status: DC | PRN

## 2023-11-14 MED ORDER — TOPIRAMATE 25 MG PO TABS
25.0000 mg | ORAL_TABLET | Freq: Every day | ORAL | Status: DC
  Administered 2023-11-15 – 2023-11-16 (×2): 25 mg via ORAL
  Filled 2023-11-14 (×2): qty 1

## 2023-11-14 MED ORDER — VANCOMYCIN HCL 1500 MG/300ML IV SOLN
1500.0000 mg | INTRAVENOUS | Status: AC
  Administered 2023-11-14: 1500 mg via INTRAVENOUS
  Filled 2023-11-14: qty 300

## 2023-11-14 MED ORDER — ASPIRIN 81 MG PO TBEC
81.0000 mg | DELAYED_RELEASE_TABLET | Freq: Every day | ORAL | Status: DC
  Administered 2023-11-15 – 2023-11-16 (×2): 81 mg via ORAL
  Filled 2023-11-14 (×2): qty 1

## 2023-11-14 MED ORDER — ONDANSETRON HCL 4 MG/2ML IJ SOLN: 4.0000 mg | Freq: Four times a day (QID) | INTRAMUSCULAR | Status: DC | PRN

## 2023-11-14 MED ORDER — FENTANYL CITRATE (PF) 250 MCG/5ML IJ SOLN
INTRAMUSCULAR | Status: AC
Start: 2023-11-14 — End: ?
  Filled 2023-11-14: qty 5

## 2023-11-14 MED ORDER — BUPIVACAINE-EPINEPHRINE (PF) 0.25% -1:200000 IJ SOLN
INTRAMUSCULAR | Status: AC
  Filled 2023-11-14: qty 30

## 2023-11-14 MED ORDER — PHENOL 1.4 % MT LIQD: 1.0000 | OROMUCOSAL | Status: DC | PRN

## 2023-11-14 MED ORDER — TRAZODONE HCL 50 MG PO TABS
50.0000 mg | ORAL_TABLET | Freq: Every day | ORAL | Status: DC
  Administered 2023-11-14: 50 mg via ORAL
  Administered 2023-11-15: 100 mg via ORAL
  Filled 2023-11-14 (×2): qty 2

## 2023-11-14 MED ORDER — ONDANSETRON HCL 4 MG/2ML IJ SOLN
INTRAMUSCULAR | Status: AC
  Filled 2023-11-14: qty 2

## 2023-11-14 MED ORDER — VANCOMYCIN HCL 1000 MG IV SOLR
INTRAVENOUS | Status: DC | PRN
  Administered 2023-11-14: 1000 mg via TOPICAL

## 2023-11-14 MED ORDER — PREGABALIN 25 MG PO CAPS
100.0000 mg | ORAL_CAPSULE | Freq: Three times a day (TID) | ORAL | Status: DC
  Administered 2023-11-14 – 2023-11-16 (×7): 100 mg via ORAL
  Filled 2023-11-14 (×7): qty 4

## 2023-11-14 MED ORDER — AMITRIPTYLINE HCL 25 MG PO TABS
25.0000 mg | ORAL_TABLET | Freq: Every day | ORAL | Status: DC
Start: 2023-11-14 — End: 2023-11-16
  Administered 2023-11-14 – 2023-11-15 (×2): 25 mg via ORAL
  Filled 2023-11-14 (×3): qty 1

## 2023-11-14 MED ORDER — BUPROPION HCL ER (XL) 150 MG PO TB24
150.0000 mg | ORAL_TABLET | Freq: Every day | ORAL | Status: DC
  Administered 2023-11-15 – 2023-11-16 (×2): 150 mg via ORAL
  Filled 2023-11-14 (×2): qty 1

## 2023-11-14 MED ORDER — OXYBUTYNIN CHLORIDE ER 5 MG PO TB24
15.0000 mg | ORAL_TABLET | Freq: Every day | ORAL | Status: DC
  Administered 2023-11-15 – 2023-11-16 (×2): 15 mg via ORAL
  Filled 2023-11-14 (×2): qty 1

## 2023-11-14 MED ORDER — MORPHINE SULFATE 4 MG/ML IJ SOLN
INTRAMUSCULAR | Status: DC | PRN
  Administered 2023-11-14: 13 mL

## 2023-11-14 MED ORDER — LIDOCAINE 2% (20 MG/ML) 5 ML SYRINGE
INTRAMUSCULAR | Status: DC | PRN
  Administered 2023-11-14: 60 mg via INTRAVENOUS

## 2023-11-14 MED ORDER — MENTHOL 3 MG MT LOZG: 1.0000 | LOZENGE | OROMUCOSAL | Status: DC | PRN

## 2023-11-14 MED ORDER — FERROUS GLUCONATE 324 (38 FE) MG PO TABS
324.0000 mg | ORAL_TABLET | Freq: Every day | ORAL | Status: DC
  Administered 2023-11-15 – 2023-11-16 (×2): 324 mg via ORAL
  Filled 2023-11-14 (×2): qty 1

## 2023-11-14 MED ORDER — OXYCODONE HCL 5 MG PO TABS
10.0000 mg | ORAL_TABLET | Freq: Once | ORAL | Status: AC
  Administered 2023-11-14: 10 mg via ORAL

## 2023-11-14 MED ORDER — OXYCODONE HCL 5 MG PO TABS
ORAL_TABLET | ORAL | Status: AC
  Filled 2023-11-14: qty 2

## 2023-11-14 MED ORDER — HYDROMORPHONE HCL 1 MG/ML IJ SOLN
0.2500 mg | INTRAMUSCULAR | Status: DC | PRN
  Administered 2023-11-14: 0.25 mg via INTRAVENOUS
  Administered 2023-11-14 (×3): 0.5 mg via INTRAVENOUS

## 2023-11-14 MED ORDER — DOCUSATE SODIUM 100 MG PO CAPS
100.0000 mg | ORAL_CAPSULE | Freq: Two times a day (BID) | ORAL | Status: DC
  Administered 2023-11-14 – 2023-11-16 (×4): 100 mg via ORAL
  Filled 2023-11-14 (×4): qty 1

## 2023-11-14 MED ORDER — OXYCODONE HCL 5 MG PO TABS
5.0000 mg | ORAL_TABLET | ORAL | Status: DC | PRN
  Administered 2023-11-15 – 2023-11-16 (×3): 10 mg via ORAL
  Filled 2023-11-14 (×3): qty 2

## 2023-11-14 MED ORDER — PROPOFOL 1000 MG/100ML IV EMUL
INTRAVENOUS | Status: AC
  Filled 2023-11-14: qty 100

## 2023-11-14 MED ORDER — FENTANYL CITRATE (PF) 250 MCG/5ML IJ SOLN
INTRAMUSCULAR | Status: DC | PRN
Start: 2023-11-14 — End: 2023-11-14
  Administered 2023-11-14: 25 ug via INTRAVENOUS
  Administered 2023-11-14 (×3): 50 ug via INTRAVENOUS
  Administered 2023-11-14: 25 ug via INTRAVENOUS
  Administered 2023-11-14: 50 ug via INTRAVENOUS

## 2023-11-14 MED ORDER — MIDAZOLAM HCL 2 MG/2ML IJ SOLN
INTRAMUSCULAR | Status: DC | PRN
  Administered 2023-11-14: 2 mg via INTRAVENOUS

## 2023-11-14 MED ORDER — VANCOMYCIN HCL 1000 MG IV SOLR
INTRAVENOUS | Status: AC
  Filled 2023-11-14: qty 20

## 2023-11-14 MED ORDER — ORAL CARE MOUTH RINSE: 15.0000 mL | Freq: Once | OROMUCOSAL | Status: AC

## 2023-11-14 MED ORDER — BUPIVACAINE LIPOSOME 1.3 % IJ SUSP
INTRAMUSCULAR | Status: AC
  Filled 2023-11-14: qty 20

## 2023-11-14 MED ORDER — LIDOCAINE 2% (20 MG/ML) 5 ML SYRINGE
INTRAMUSCULAR | Status: AC
  Filled 2023-11-14: qty 5

## 2023-11-14 MED ORDER — METOCLOPRAMIDE HCL 5 MG/ML IJ SOLN: 5.0000 mg | Freq: Three times a day (TID) | INTRAMUSCULAR | Status: DC | PRN

## 2023-11-14 MED ORDER — CEFAZOLIN SODIUM-DEXTROSE 2-4 GM/100ML-% IV SOLN
2.0000 g | Freq: Three times a day (TID) | INTRAVENOUS | Status: AC
  Administered 2023-11-14 – 2023-11-15 (×3): 2 g via INTRAVENOUS
  Filled 2023-11-14 (×3): qty 100

## 2023-11-14 MED ORDER — PROPOFOL 10 MG/ML IV BOLUS
INTRAVENOUS | Status: DC | PRN
  Administered 2023-11-14: 200 mg via INTRAVENOUS

## 2023-11-14 MED ORDER — SUCCINYLCHOLINE CHLORIDE 200 MG/10ML IV SOSY
PREFILLED_SYRINGE | INTRAVENOUS | Status: DC | PRN
  Administered 2023-11-14: 100 mg via INTRAVENOUS

## 2023-11-14 MED ORDER — ACETAMINOPHEN 10 MG/ML IV SOLN
INTRAVENOUS | Status: AC
  Filled 2023-11-14: qty 100

## 2023-11-14 MED ORDER — METHOCARBAMOL 1000 MG/10ML IJ SOLN: 500.0000 mg | Freq: Four times a day (QID) | INTRAMUSCULAR | Status: DC | PRN

## 2023-11-14 MED ORDER — KETOROLAC TROMETHAMINE 30 MG/ML IJ SOLN: 30.0000 mg | Freq: Once | INTRAMUSCULAR | Status: DC

## 2023-11-14 MED ORDER — METOCLOPRAMIDE HCL 5 MG PO TABS: 5.0000 mg | ORAL_TABLET | Freq: Three times a day (TID) | ORAL | Status: DC | PRN

## 2023-11-14 MED ORDER — LEVOTHYROXINE SODIUM 150 MCG PO TABS
150.0000 ug | ORAL_TABLET | Freq: Every day | ORAL | Status: DC
  Administered 2023-11-15 – 2023-11-16 (×2): 150 ug via ORAL
  Filled 2023-11-14: qty 1
  Filled 2023-11-14 (×2): qty 2
  Filled 2023-11-14: qty 1

## 2023-11-14 SURGICAL SUPPLY — 79 items
ALCOHOL 70% 16 OZ (MISCELLANEOUS) ×1 IMPLANT
AUG COMP REV MI TAPER ADAPTER (Joint) ×1 IMPLANT
AUGMENT COMP REV MI TAPR ADPTR (Joint) IMPLANT
BAG COUNTER SPONGE SURGICOUNT (BAG) ×1 IMPLANT
BEARING HUMERAL SHLDER 36M STD (Shoulder) IMPLANT
BENZOIN TINCTURE PRP APPL 2/3 (GAUZE/BANDAGES/DRESSINGS) IMPLANT
BIT DRILL 2.7 W/STOP DISP (BIT) IMPLANT
BIT DRILL QUICK REL 1/8 2PK SL (BIT) IMPLANT
BIT DRILL TWIST 2.7 (BIT) IMPLANT
BLADE SAW SGTL 13X75X1.27 (BLADE) ×1 IMPLANT
CANISTER WOUNDNEG PRESSURE 500 (CANNISTER) IMPLANT
CHLORAPREP W/TINT 26 (MISCELLANEOUS) ×1 IMPLANT
COOLER ICEMAN CLASSIC (MISCELLANEOUS) ×1 IMPLANT
COVER SURGICAL LIGHT HANDLE (MISCELLANEOUS) ×1 IMPLANT
DRAPE DERMATAC (DRAPES) IMPLANT
DRAPE INCISE IOBAN 66X45 STRL (DRAPES) ×1 IMPLANT
DRAPE U-SHAPE 47X51 STRL (DRAPES) ×2 IMPLANT
DRESSING PEEL AND PLAC PRVNA20 (GAUZE/BANDAGES/DRESSINGS) IMPLANT
DRSG AQUACEL AG ADV 3.5X10 (GAUZE/BANDAGES/DRESSINGS) ×1 IMPLANT
DRSG PEEL AND PLACE PREVENA 20 (GAUZE/BANDAGES/DRESSINGS) ×1 IMPLANT
ELECT BLADE 4.0 EZ CLEAN MEGAD (MISCELLANEOUS) ×1 IMPLANT
ELECT REM PT RETURN 9FT ADLT (ELECTROSURGICAL) ×1 IMPLANT
ELECTRODE BLDE 4.0 EZ CLN MEGD (MISCELLANEOUS) ×1 IMPLANT
ELECTRODE REM PT RTRN 9FT ADLT (ELECTROSURGICAL) ×1 IMPLANT
GAUZE SPONGE 4X4 12PLY STRL LF (GAUZE/BANDAGES/DRESSINGS) ×1 IMPLANT
GLENOID SPHERE STD STRL 36MM (Orthopedic Implant) IMPLANT
GLOVE BIOGEL PI IND STRL 6.5 (GLOVE) ×1 IMPLANT
GLOVE BIOGEL PI IND STRL 8 (GLOVE) ×1 IMPLANT
GLOVE ECLIPSE 6.5 STRL STRAW (GLOVE) ×1 IMPLANT
GLOVE ECLIPSE 8.0 STRL XLNG CF (GLOVE) ×1 IMPLANT
GOWN STRL REUS W/ TWL LRG LVL3 (GOWN DISPOSABLE) ×1 IMPLANT
GOWN STRL REUS W/ TWL XL LVL3 (GOWN DISPOSABLE) ×1 IMPLANT
GRAFT SKIN WND MICRO 38 (Tissue) IMPLANT
GUIDE BONE RSA SHLD ROT RT (ORTHOPEDIC DISPOSABLE SUPPLIES) IMPLANT
HYDROGEN PEROXIDE 16OZ (MISCELLANEOUS) ×1 IMPLANT
JET LAVAGE IRRISEPT WOUND (IRRIGATION / IRRIGATOR) ×1 IMPLANT
KIT BASIN OR (CUSTOM PROCEDURE TRAY) ×1 IMPLANT
KIT DRSG PREVENA PLUS 7DAY 125 (MISCELLANEOUS) IMPLANT
KIT TURNOVER KIT B (KITS) ×1 IMPLANT
LAVAGE JET IRRISEPT WOUND (IRRIGATION / IRRIGATOR) ×1 IMPLANT
MANIFOLD NEPTUNE II (INSTRUMENTS) ×1 IMPLANT
NDL HYPO 21X1.5 SAFETY (NEEDLE) IMPLANT
NDL SPNL 20GX3.5 QUINCKE YW (NEEDLE) IMPLANT
NDL SUT 6 .5 CRC .975X.05 MAYO (NEEDLE) IMPLANT
NEEDLE HYPO 21X1.5 SAFETY (NEEDLE) ×1 IMPLANT
NEEDLE SPNL 20GX3.5 QUINCKE YW (NEEDLE) ×1 IMPLANT
NS IRRIG 1000ML POUR BTL (IV SOLUTION) ×1 IMPLANT
PACK SHOULDER (CUSTOM PROCEDURE TRAY) ×1 IMPLANT
PAD COLD SHLDR WRAP-ON (PAD) ×1 IMPLANT
PIN THREADED REVERSE (PIN) IMPLANT
REAMER GUIDE BUSHING SURG DISP (MISCELLANEOUS) IMPLANT
REAMER GUIDE W/SCREW AUG (MISCELLANEOUS) IMPLANT
RESTRAINT HEAD UNIVERSAL NS (MISCELLANEOUS) ×1 IMPLANT
RETRIEVER SUT HEWSON (MISCELLANEOUS) ×1 IMPLANT
SCREW CENTRAL 6.5X20MM (Screw) IMPLANT
SCREW LOCKING 4.75MMX15MM (Screw) IMPLANT
SCREW LOCKING NS 4.75MMX20MM (Screw) IMPLANT
SCREW LOCKING STRL 4.75X25X3.5 (Screw) IMPLANT
SHOULDER HUMERAL BEAR 36M STD (Shoulder) ×1 IMPLANT
SLING ARM IMMOBILIZER LRG (SOFTGOODS) ×1 IMPLANT
SLING ARM IMMOBILIZER XL (CAST SUPPLIES) IMPLANT
SOL PREP POV-IOD 4OZ 10% (MISCELLANEOUS) ×1 IMPLANT
SPONGE T-LAP 18X18 ~~LOC~~+RFID (SPONGE) ×1 IMPLANT
SPONGE T-LAP 4X18 ~~LOC~~+RFID (SPONGE) IMPLANT
STEM HUMERAL STRL 9MMX83MM (Stem) IMPLANT
STOCKINETTE IMPERVIOUS LG (DRAPES) IMPLANT
STRIP CLOSURE SKIN 1/2X4 (GAUZE/BANDAGES/DRESSINGS) ×1 IMPLANT
SUCTION TUBE FRAZIER 10FR DISP (SUCTIONS) ×1 IMPLANT
SUT BROADBAND TAPE 2PK 1.5 (SUTURE) IMPLANT
SUT MNCRL AB 3-0 PS2 18 (SUTURE) ×1 IMPLANT
SUT SILK 2 0 TIES 10X30 (SUTURE) ×1 IMPLANT
SUT VIC AB 0 CT1 27XBRD ANBCTR (SUTURE) ×4 IMPLANT
SUT VIC AB 1 CT1 27XBRD ANBCTR (SUTURE) ×2 IMPLANT
SUT VIC AB 1 CT1 36 (SUTURE) IMPLANT
SUT VIC AB 2-0 CT2 27 (SUTURE) ×3 IMPLANT
SUT VICRYL 0 UR6 27IN ABS (SUTURE) ×2 IMPLANT
TOWEL GREEN STERILE (TOWEL DISPOSABLE) ×1 IMPLANT
TRAY HUM REV SHOULDER STD +6 (Shoulder) IMPLANT
WATER STERILE IRR 1000ML POUR (IV SOLUTION) ×1 IMPLANT

## 2023-11-14 NOTE — Plan of Care (Signed)
  Problem: Clinical Measurements: Goal: Respiratory complications will improve Outcome: Progressing   Problem: Coping: Goal: Level of anxiety will decrease Outcome: Progressing   Problem: Pain Managment: Goal: General experience of comfort will improve and/or be controlled Outcome: Progressing   Problem: Safety: Goal: Ability to remain free from injury will improve Outcome: Progressing   Problem: Skin Integrity: Goal: Risk for impaired skin integrity will decrease Outcome: Progressing

## 2023-11-14 NOTE — Brief Op Note (Signed)
    11/14/2023  12:31 PM  PATIENT:  Seward Speck Meyer  53 y.o. female  PRE-OPERATIVE DIAGNOSIS:  right shoiulder osteoarthritis  POST-OPERATIVE DIAGNOSIS:  right shoiulder osteoarthritis  PROCEDURE:  Procedure(s): RIGHT REVERSE SHOULDER ARTHROPLASTY  SURGEON:  Surgeon(s): August Saucer, Corrie Mckusick, MD  ASSISTANT: magnant pa  ANESTHESIA:   general  EBL: 75 ml    Total I/O In: 1150 [I.V.:500; IV Piggyback:650] Out: 75 [Blood:75]  BLOOD ADMINISTERED: none  DRAINS: none   LOCAL MEDICATIONS USED:  none  SPECIMEN:  No Specimen  COUNTS:  YES  TOURNIQUET:  * No tourniquets in log *  DICTATION: .Other Dictation: Dictation Number [id number]  PLAN OF CARE: Admit for overnight observation  PATIENT DISPOSITION:  PACU - hemodynamically stable

## 2023-11-14 NOTE — Anesthesia Procedure Notes (Addendum)
 Procedure Name: Intubation Date/Time: 11/14/2023 8:11 AM  Performed by: Shary Decamp, CRNAPre-anesthesia Checklist: Patient identified, Patient being monitored, Timeout performed, Emergency Drugs available and Suction available Patient Re-evaluated:Patient Re-evaluated prior to induction Oxygen Delivery Method: Circle System Utilized Preoxygenation: Pre-oxygenation with 100% oxygen Induction Type: IV induction and Rapid sequence Ventilation: Mask ventilation without difficulty Laryngoscope Size: Miller and 2 Grade View: Grade I Tube type: Oral Tube size: 7.0 mm Number of attempts: 1 Airway Equipment and Method: Stylet Placement Confirmation: ETT inserted through vocal cords under direct vision, positive ETCO2 and breath sounds checked- equal and bilateral Secured at: 21 cm Tube secured with: Tape Dental Injury: Teeth and Oropharynx as per pre-operative assessment

## 2023-11-14 NOTE — Op Note (Signed)
 [name], [name] MEDICAL RECORD NO: [id number] ACCOUNT NO: [id number] DATE OF BIRTH: Jan 29, 1971 FACILITY: [facility] LOCATION: [facility] PHYSICIAN: Marilyn Shiver. August Saucer, MD  Operative Report   DATE OF PROCEDURE: 11/14/2023  PREOPERATIVE DIAGNOSIS:  Right shoulder end-stage arthritis.  POSTOPERATIVE DIAGNOSIS:  Right shoulder end-stage arthritis.  PROCEDURE:  Right reverse shoulder replacement using small augmented baseplate with one central compression screw, four peripheral locking screws, 36 standard glenosphere with mini humeral stem size 9 mm x 83 and standard thickness +6 tapered offset 40  mm humeral tray with standard 36 mm bearing.  SURGEON:  Marilyn Shiver. August Saucer, MD  ASSISTANT:  Karenann Cai, PA  INDICATIONS:  The patient is a 53 year old patient with end-stage right shoulder arthritis. Failure of conservative management. Night pain, rest pain, as well as pain which interferes with ADLs. Reasonable pain relief from left reverse shoulder  replacement. Significant medical comorbidities, increased operative risk, all of which is discussed with the patient on multiple occasions prior to surgery.  DESCRIPTION OF PROCEDURE:  The patient was brought to the operating room where general anesthetic was induced, preoperative antibiotic administered, timeout was called. The patient was placed in the beach chair position with the head in neutral position.  The right arm was prescrubbed with hydrogen peroxide followed by alcohol and Betadine, which was allowed to air dry and prepped with ChloraPrep solution and draped in a sterile manner. [name] was used to seal the operative field and cover the operative  field. Timeout was called.  After calling timeout, deltopectoral approach was made. Skin and subcutaneous tissue was sharply divided. The cephalic vein was localized and mobilized laterally. Significant soft tissue envelope was present. The deltopectoral  interval was then developed. Proximal 1.5 cm of  the pec was released. Deltoid was elevated off its anterior attachment using manual elevation and subdeltoid and subcoracoid and subacromial adhesions were released. Biceps tendon then tenodesed to the pec  tendon as well as surrounding soft tissue. Next, the proximal part of the biceps tendon was tagged and then the rotator interval and the transverse humeral ligament was opened. Next, the axillary nerve was palpated and protected at all times during the  case. Circumflex vessels were ligated using silk sutures. The subscapularis was then detached along with the capsule using sharp dissection initially. Then, the cautery was used to carry the dissection down about 2 cm off the humeral neck down around the  inferior humeral neck to around the 5 o'clock position. This was completed with a Cobb elevator. Rotator cuff was intact. Preop range of motion was about 35/90/130 passively. Following capsular and subscap detachment, the subscap was tagged. The rotator  interval was completely opened up to the base of the coracoid. Next, the humerus was dislocated. Browne retractor was placed. Proximal reaming was then performed up to a size 9. The head was then cut in 30 degrees of retroversion. Broaching then  performed up to a size 9 with very good fit obtained. Cap was placed. Posterior retractor placed. Anterior retractor placed. Labrum was circumferentially excised with care being taken to avoid injury to the axillary nerve. All twitches were present when  we excised the labrum using the cautery. Bankart lesion created from the 12 o'clock to 6 o'clock position. Guide was placed and pins were placed. Reaming was then performed. Bone was very hard on the glenoid. Proximal reaming was performed for the  augment. Next, the baseplate trial was placed. Very good contact 100% was achieved. The true baseplate was placed after we  irrigated with Irrisept solution. We also used Irrisept solution after the incision as well as  after the arthrotomy. Once central  compression screw was placed, four peripheral locking screws were placed into the very high-quality bone. Next, we did trial reductions and the best stability was achieved with the 36 standard offset glenosphere, 1.5 mm inferior offset along with the +6  humeral tray and standard-bearing polyethylene. Next, with those trial components in position, the shoulder was difficult to reduce, difficult to re-dislocate. Very good stability was present with extension, adduction, and a superior force along with  internal and external rotation at 90 degrees of abduction. Next, the construct was dislocated. The humeral broach was removed. Six SutureTapes were placed through the lesser tuberosity using a drill bit. Next, the humeral shaft was irrigated with  Irrisept solution. At this time, the trial glenosphere was removed. The true glenosphere was placed onto the dried Morse taper with 1.5 mm inferior offset and tapped into position with very good fit obtained. Next, the true stem was placed and the same  stability parameters were maintained. Prior to placing the stem, we placed the vancomycin powder into the canal. Next, after placing vancomycin powder into the canal, the true stem was placed. Same stability parameters were maintained and the true +6  base plate and standard offset polyethylene liner were placed. Same stability parameters were maintained. 3 liters of irrigating solution poured into the shoulder joint at this time. The subscap was then repaired to the lesser tuberosity using the  SutureTapes x6 and NICE knots. Good repair was achieved with the arm in 30 degrees of external rotation. Thorough irrigation was again performed in the joint using Irrisept solution followed by vancomycin powder placed on the prosthesis. The rotator  interval was then closed using #1 Vicryl suture with the arm in 30 degrees of external rotation. The axillary nerve again palpated and found to  be intact. Next, the Irrisept solution was used on top of the closure and vancomycin powder placed. The  deltopectoral interval was then closed using #1 Vicryl suture. Irrigation again performed and then we placed Kerecis into that part of the incision and then that was closed using 0 Vicryl suture, 2-0 Vicryl suture, and 3-0 Monocryl with Steri-Strips  applied to the proximal distal end of the incision and an incisional wound VAC applied. Shoulder immobilizer placed. The patient tolerated the procedure well without immediate complications. Transferred to the recovery room in stable condition. Luke's  assistance was required at all times for retraction, open and closing mobilization of tissue. His assistance was of medical necessity.   SHY D: 11/14/2023 12:41:44 pm T: 11/14/2023 3:18:00 pm  JOB: [id number]/ [id number]

## 2023-11-14 NOTE — Transfer of Care (Signed)
 Immediate Anesthesia Transfer of Care Note  Patient: Marilyn [name]  Procedure(s) Performed: RIGHT REVERSE SHOULDER ARTHROPLASTY (Right: Shoulder)  Patient Location: PACU  Anesthesia Type:General  Level of Consciousness: awake, alert , oriented, and patient cooperative  Airway & Oxygen Therapy: Patient Spontanous Breathing and Patient connected to face mask oxygen  Post-op Assessment: Report given to RN and Post -op Vital signs reviewed and stable  Post vital signs: Reviewed and stable  Last Vitals:  Vitals Value Taken Time  BP    Temp    Pulse 71 11/14/23 1243  Resp 23 11/14/23 1243  SpO2 100 % 11/14/23 1243  Vitals shown include unfiled device data.  Last Pain:  Vitals:   11/14/23 0627  TempSrc:   PainSc: 10-Worst pain ever      Patients Stated Pain Goal: 1 (11/14/23 4098)  Complications: No notable events documented.

## 2023-11-14 NOTE — H&P (Signed)
 Marilyn [name] is an 53 y.o. female.   Chief Complaint: right shoulder pain HPI: Marilyn [name] is a 53 year old patient with longstanding right shoulder arthritis.  Currently her pain in the shoulder is severe and debilitating.  Affects her sleep as well as keeps her from doing activities of daily living.  She has had left reverse shoulder replacement which has given her pain relief and marginal functional outcome.  Patient has had other joints replaced.  Last hospitalization the patient did have some respiratory issues which are likely a combination of the block as well as her morbid obesity.  In addition she had wound healing problems which were addressed by Dr. Lajoyce Corners and we have consulted for this case as well.  Past Medical History:  Diagnosis Date   Anemia    taking iron now. pt states having no current issues 09/02/2015   Anginal pain (HCC)    pt states experiences chest wall pain pt states related to her asthma    Anxiety    with MRI's   Arthritis    Everywhere   Asthma    Depression    Dizziness    GERD (gastroesophageal reflux disease)    Headache(784.0)    HX OF MIGRAINES   History of bronchitis    Hypertension    Hypothyroidism    takes levothyroxen   OSA on CPAP    wears cpap   Shortness of breath    with exertion   Spinal cord stimulator status    Wears glasses     Past Surgical History:  Procedure Laterality Date   APPLICATION OF WOUND VAC  11/16/2021   Procedure: APPLICATION OF WOUND VAC;  Surgeon: Cammy Copa, MD;  Location: [facility] OR;  Service: Orthopedics;;   APPLICATION OF WOUND VAC Left 12/01/2021   Procedure: APPLICATION OF WOUND VAC;  Surgeon: Cammy Copa, MD;  Location: [facility] OR;  Service: Orthopedics;  Laterality: Left;   BACK SURGERY     CESAREAN SECTION     times 2   CHOLECYSTECTOMY     COLONOSCOPY  2020   ENDOMETRIAL ABLATION     I & D EXTREMITY Left 12/06/2021   Procedure: LEFT SHOULDER DEBRIDEMENT AND WOUND CLOSURE;  Surgeon: [name], MD;  Location: [facility] OR;  Service: Orthopedics;  Laterality: Left;   I & D EXTREMITY Left 12/20/2021   Procedure: LEFT SHOULDER DEBRIDEMENT;  Surgeon: [name], MD;  Location: [facility] OR;  Service: Orthopedics;  Laterality: Left;   I & D EXTREMITY Left 12/22/2021   Procedure: LEFT SHOULDER DEBRIDEMENT;  Surgeon: [name], MD;  Location: [facility] OR;  Service: Orthopedics;  Laterality: Left;   INCISION AND DRAINAGE Left 12/01/2021   Procedure: LEFT SHOULDER IRRIGATION AND EXCISIONAL DEBRIDEMENT;  Surgeon: Cammy Copa, MD;  Location: [facility] OR;  Service: Orthopedics;  Laterality: Left;   INCISION AND DRAINAGE HIP Left 11/10/2015   Procedure: IRRIGATION AND DEBRIDEMENT LEFT HIP INCISION;  Surgeon: Kathryne Hitch, MD;  Location: [facility] OR;  Service: Orthopedics;  Laterality: Left;   JOINT REPLACEMENT  2011   total left knee   KNEE ARTHROPLASTY  04/23/2012   right    KNEE ARTHROSCOPY     LUMBAR LAMINECTOMY/DECOMPRESSION MICRODISCECTOMY N/A 06/23/2019   Procedure: RIGHT LUMBAR FIVE THROUGH SACRAL ONE PARTIAL HEMILAMINECTOMY WITH RIGHT LUMBAR FIVE FORAMINOTOMY;  Surgeon: Kerrin Champagne, MD;  Location: [facility] OR;  Service: Orthopedics;  Laterality: N/A;   LUMBAR WOUND DEBRIDEMENT N/A 06/11/2018   Procedure: LUMBAR WOUND DEBRIDEMENT;  Surgeon: Kerrin Champagne, MD;  Location: [facility] OR;  Service: Orthopedics;  Laterality: N/A;   RADIOLOGY WITH ANESTHESIA N/A 09/09/2018   Procedure: LUMBER SPINE WITHOUT CONTRAST;  Surgeon: Radiologist, Medication, MD;  Location: [facility] OR;  Service: Radiology;  Laterality: N/A;   REVERSE SHOULDER ARTHROPLASTY Left 11/16/2021   Procedure: LEFT REVERSE SHOULDER ARTHROPLASTY;  Surgeon: Cammy Copa, MD;  Location: [facility] OR;  Service: Orthopedics;  Laterality: Left;   ROTATOR CUFF REPAIR     left    SHOULDER SURGERY     right to repair ligament tear   SPINAL CORD STIMULATOR IMPLANT  08/02/2020   [facility]   TOTAL HIP ARTHROPLASTY Left 09/09/2015   Procedure: LEFT TOTAL  HIP ARTHROPLASTY ANTERIOR APPROACH;  Surgeon: Kathryne Hitch, MD;  Location: [facility] ORS;  Service: Orthopedics;  Laterality: Left;   TOTAL KNEE ARTHROPLASTY  04/23/2012   Procedure: TOTAL KNEE ARTHROPLASTY;  Surgeon: [name], MD;  Location: [facility] OR;  Service: Orthopedics;  Laterality: Right;  Right Total Knee Arthroplasty   TUBAL LIGATION  1996    Family History  Problem Relation Age of Onset   Cancer Mother        colon   Epilepsy Mother    Cancer Father        prostate   Diabetes Father    Hypertension Father    Hypertension Maternal Aunt    Diabetes Maternal Aunt    Hypertension Paternal Aunt    Social History:  reports that she quit smoking about 36 years ago. Her smoking use included cigarettes. She started smoking about 40 years ago. She has a 2 pack-year smoking history. She has never used smokeless tobacco. She reports that she does not drink alcohol and does not use drugs.  Allergies:  Allergies  Allergen Reactions   Lisinopril Anaphylaxis    Angioedema   Bee Venom Swelling and Other (See Comments)    Swelling at the site   Bupropion Swelling   Latex Itching and Rash   Propoxyphene Hives   Methadone Other (See Comments)    unknown   Codeine Nausea Only   Meloxicam Other (See Comments)    Insomnia, constipation   Morphine And Codeine Rash   Tegaderm Ag Mesh [Silver] Hives, Itching and Rash   Tomato Rash    Medications Prior to Admission  Medication Sig Dispense Refill   albuterol (VENTOLIN HFA) 108 (90 Base) MCG/ACT inhaler Inhale 1-2 puffs into the lungs every 6 (six) hours as needed for wheezing or shortness of breath. 8 g 0   allopurinol (ZYLOPRIM) 100 MG tablet Take 1 tablet (100 mg total) by mouth 2 (two) times daily. 180 tablet 0   amitriptyline (ELAVIL) 25 MG tablet Take 25 mg by mouth at bedtime.     budesonide (PULMICORT) 0.5 MG/2ML nebulizer solution Take 0.5 mg by nebulization 2 (two) times daily as needed.     buPROPion (WELLBUTRIN XL) 150 MG  24 hr tablet Take 150 mg by mouth daily. Take with 300 mg to equal 450     buPROPion (WELLBUTRIN XL) 300 MG 24 hr tablet Take 300 mg by mouth daily. Take with 150 mg to equal 450 mg     cyclobenzaprine (FLEXERIL) 10 MG tablet Take 1 tablet by mouth 3 (three) times daily as needed.     Ferrous Gluconate 324 (37.5 Fe) MG TABS Take 324 mg by mouth daily.     furosemide (LASIX) 20 MG tablet Take 20 mg by mouth 2 (two) times daily  as needed for fluid or edema.     hydrochlorothiazide (HYDRODIURIL) 25 MG tablet Take 25 mg by mouth daily.     ipratropium-albuterol (DUONEB) 0.5-2.5 (3) MG/3ML SOLN Inhale 3 mLs into the lungs every 4 (four) hours as needed (SOB/Wheezing).     levothyroxine (SYNTHROID) 150 MCG tablet Take 150 mcg by mouth daily before breakfast.     naloxone (NARCAN) nasal spray 4 mg/0.1 mL Place into the nose.     nystatin (MYCOSTATIN/NYSTOP) powder Apply 1 Application topically 3 (three) times daily.     omeprazole (PRILOSEC) 40 MG capsule Take 40 mg by mouth daily.     oxybutynin (DITROPAN XL) 15 MG 24 hr tablet Take 15 mg by mouth daily.      oxyCODONE-acetaminophen (PERCOCET) 7.5-325 MG tablet Take 1 tablet by mouth 3 (three) times daily as needed for moderate pain (pain score 4-6) or severe pain (pain score 7-10).     pilocarpine (SALAGEN) 5 MG tablet Take 5 mg by mouth 3 (three) times daily.     pregabalin (LYRICA) 100 MG capsule Take 100 mg by mouth 3 (three) times daily.     topiramate (TOPAMAX) 25 MG tablet Take 25 mg by mouth daily.     traZODone (DESYREL) 50 MG tablet Take 50-100 mg by mouth at bedtime.     vitamin B-12 (CYANOCOBALAMIN) 250 MCG tablet Take 250 mcg by mouth daily.     Cetirizine HCl (ZYRTEC ALLERGY) 10 MG CAPS Take by mouth. (Patient not taking: Reported on 11/08/2023)     cycloSPORINE (RESTASIS) 0.05 % ophthalmic emulsion Place 1 drop into both eyes 2 (two) times daily as needed (dry eyes). (Patient not taking: Reported on 11/08/2023)     DULoxetine (CYMBALTA) 60  MG capsule Take 60 mg by mouth 2 (two) times daily. (Patient not taking: Reported on 11/08/2023)      No results found. However, due to the size of the patient record, not all encounters were searched. Please check Results Review for a complete set of results. No results found.  Review of Systems  Musculoskeletal:  Positive for arthralgias.  All other systems reviewed and are negative.   Blood pressure (!) 157/70, pulse 63, temperature 98.3 F (36.8 C), temperature source Oral, resp. rate 17, height 5\' 2"  (1.575 m), weight (!) 153.3 kg, SpO2 98%. Physical Exam Vitals reviewed.  HENT:     Head: Normocephalic.     Nose: Nose normal.     Mouth/Throat:     Mouth: Mucous membranes are moist.  Eyes:     Pupils: Pupils are equal, round, and reactive to light.  Cardiovascular:     Rate and Rhythm: Normal rate.     Pulses: Normal pulses.  Pulmonary:     Effort: Pulmonary effort is normal.  Abdominal:     General: Abdomen is flat.  Musculoskeletal:     Cervical back: Normal range of motion.  Skin:    General: Skin is warm.     Capillary Refill: Capillary refill takes less than 2 seconds.  Neurological:     General: No focal deficit present.     Mental Status: She is alert.  Psychiatric:        Mood and Affect: Mood normal.    Ortho exam demonstrates right shoulder with about 50 degrees active abduction and forward flexion.  Plan of crepitus noted with passive motion of the shoulder.  Axillary nerve intact with deltoid firing.  Intact lateral sensation over the deltoid.  2+ radial pulse of  the right upper extremity.  Intact EPL, FPL, finger abduction.   Assessment/Plan Impression is severe right shoulder arthritis in a patient who has morbid obesity.  BMI 63.  We have explained to [facility] that her BMI puts her in the risk category which is elevated over the normal risk of joint replacement.  This could potentially complicate not only the right reverse shoulder replacement but her other  multiple joint replacements.  It is explained that it is clear from the data that she is in a higher risk full because of her increased body mass index.  Nonetheless her current state of pain in the shoulder is to the degree that she is willing to undergo the risk of surgery including but not limited to infection instability nerve and vessel damage as well as delayed wound healing and possible need for more surgery not only in the right shoulder but in her other joints as well.  We have had these discussions with [facility] on multiple occasions but the pain is so severe in the right shoulder that she really "cannot live with this pain anymore".  In addition we will use both IV vancomycin and IV Ancef as preoperative antibiotics.  Furthermore we will plan to use Kerecis in the incision along with an incisional wound VAC per Dr. Audrie Lia recommendations.  We will also likely keep the shoulder immobile for the first 5 days in order to allow for wound healing.  Interscalene block should not be performed in this patient.  We will use local anesthetic in the field.  Burnard Bunting, MD 11/14/2023, 6:34 AM

## 2023-11-14 NOTE — Anesthesia Preprocedure Evaluation (Addendum)
 Anesthesia Evaluation  Patient identified by MRN, date of birth, ID band Patient awake    Reviewed: Allergy & Precautions, NPO status , Patient's Chart, lab work & pertinent test results  Airway Mallampati: II  TM Distance: >3 FB Neck ROM: Full    Dental  (+) Teeth Intact, Dental Advisory Given   Pulmonary asthma , sleep apnea and Continuous Positive Airway Pressure Ventilation , former smoker   breath sounds clear to auscultation       Cardiovascular hypertension, Pt. on medications + angina   Rhythm:Regular Rate:Normal     Neuro/Psych  Headaches PSYCHIATRIC DISORDERS Anxiety Depression     Neuromuscular disease    GI/Hepatic Neg liver ROS,GERD  ,,  Endo/Other  Hypothyroidism  Class 4 obesity  Renal/GU Renal disease     Musculoskeletal  (+) Arthritis ,    Abdominal   Peds  Hematology  (+) Blood dyscrasia, anemia   Anesthesia Other Findings   Reproductive/Obstetrics                             Anesthesia Physical Anesthesia Plan  ASA: 4  Anesthesia Plan: General   Post-op Pain Management:    Induction: Intravenous  PONV Risk Score and Plan: 4 or greater and Ondansetron, Dexamethasone, Midazolam and Scopolamine patch - Pre-op  Airway Management Planned: Oral ETT  Additional Equipment: None  Intra-op Plan:   Post-operative Plan: Extubation in OR  Informed Consent:   Plan Discussed with: CRNA  Anesthesia Plan Comments:        Anesthesia Quick Evaluation

## 2023-11-15 ENCOUNTER — Encounter (HOSPITAL_COMMUNITY): Payer: Self-pay | Admitting: Orthopedic Surgery

## 2023-11-15 DIAGNOSIS — M19011 Primary osteoarthritis, right shoulder: Secondary | ICD-10-CM | POA: Diagnosis not present

## 2023-11-15 NOTE — Progress Notes (Signed)
 I called patient and spoke with her.  She is doing well overall.  Pain in her shoulder is pretty well-controlled aside from 1 movement that she did with occupational therapy but aside from this instance, shoulder is doing well.  Her main concern is numbness in her right foot that is new for her since surgery.  She localizes the numbness to the arch of the foot with no numbness on the top of the foot.  She has no dropfoot and she is able to dorsiflex her right ankle.  She does have recent episodes of right-sided sciatica that are bothering her enough to seek evaluation at the emergency department several weeks ago.  This may be an exacerbation of her existing low back pathology.  She does feel like the numbness in her foot has improved to some degree with her toes regain sensation.  For now we will just monitor this with reevaluation tomorrow.  She was able to ambulate well with physical therapy for about 80 feet using a hemiwalker.  She denies any fevers or chills or any shortness of breath.  Plan is discharge home tomorrow morning.  Discharge orders will be placed and arranged and Dr. August Saucer will stop by to evaluate her tomorrow morning prior to discharge.

## 2023-11-15 NOTE — Anesthesia Postprocedure Evaluation (Signed)
 Anesthesia Post Note  Patient: Marilyn [name]  Procedure(s) Performed: RIGHT REVERSE SHOULDER ARTHROPLASTY (Right: Shoulder)     Patient location during evaluation: PACU Anesthesia Type: General Level of consciousness: awake and alert Pain management: pain level controlled Vital Signs Assessment: post-procedure vital signs reviewed and stable Respiratory status: spontaneous breathing, nonlabored ventilation, respiratory function stable and patient connected to nasal cannula oxygen Cardiovascular status: blood pressure returned to baseline and stable Postop Assessment: no apparent nausea or vomiting Anesthetic complications: no   No notable events documented.               Shelton Silvas

## 2023-11-15 NOTE — Evaluation (Addendum)
 Occupational Therapy Evaluation Patient Details Name: Marilyn [name] MRN: [id number] DOB: Jan 28, 1971 Today's Date: 11/15/2023   History of Present Illness   53 y/o F admitted 2/27 for Rt reverse TSA. PMHx: spinal stenosis, s/p lumbar lami and fusion, OA, morbid obesity, GERD, asthma, anxiety, HTN, OSA, hypothyroidism.     Clinical Impressions This 53 yo female admitted and underwent above presents to acute OT with PLOF of needing occasional A for basic ADLs and IADLs. Currently she needs setup-total A for basic ADLs and min A for mobility (bed and stand step from bed>recliner). She will continue to benefit from acute OT with follow up HHOT.  Okay for lap slides per secure chat with Dr. August Saucer (since she cannot safely do pendulums)     If plan is discharge home, recommend the following:   A little help with walking and/or transfers;A lot of help with bathing/dressing/bathroom;Assistance with cooking/housework;Help with stairs or ramp for entrance;Assist for transportation     Functional Status Assessment   Patient has had a recent decline in their functional status and demonstrates the ability to make significant improvements in function in a reasonable and predictable amount of time.     Equipment Recommendations   None recommended by OT      Precautions/Restrictions   Precautions Precautions: Fall;Shoulder Type of Shoulder Precautions: Sling At all times except ADL / exercise;  Non weight bearing-YES;  AROM elbow, wrist and hand to tolerance- YES;  OK for pendulums-YES;  External Rotation 0-30; AROM of shoulder--NO Shoulder Interventions: Shoulder sling/immobilizer Precaution Booklet Issued: Yes (comment) Recall of Precautions/Restrictions: Intact Required Braces or Orthoses: Sling Restrictions Weight Bearing Restrictions Per Provider Order: Yes RUE Weight Bearing Per Provider Order: Non weight bearing     Mobility Bed Mobility Overal bed mobility: Needs  Assistance Bed Mobility: Supine to Sit     Supine to sit: Min assist, HOB elevated, Used rails     General bed mobility comments: support at back to maintain balance while working her way to EOB    Transfers Overall transfer level: Needs assistance Equipment used: 1 person hand held assist Transfers: Sit to/from Stand, Bed to chair/wheelchair/BSC Sit to Stand: Min assist     Step pivot transfers: Min assist     General transfer comment: min A      Balance Overall balance assessment: Needs assistance Sitting-balance support: No upper extremity supported, Feet supported Sitting balance-Leahy Scale: Good     Standing balance support: Single extremity supported Standing balance-Leahy Scale: Poor Standing balance comment: reliant on one person support                           ADL either performed or assessed with clinical judgement   ADL Overall ADL's : Needs assistance/impaired Eating/Feeding: Set up;Sitting   Grooming: Moderate assistance;Sitting   Upper Body Bathing: Moderate assistance;Sitting   Lower Body Bathing: Total assistance Lower Body Bathing Details (indicate cue type and reason): min A sit<>stand Upper Body Dressing : Total assistance;Sitting Upper Body Dressing Details (indicate cue type and reason): min A sit<>stand Lower Body Dressing: Total assistance Lower Body Dressing Details (indicate cue type and reason): min A sit<>stand Toilet Transfer: Minimal assistance;Stand-pivot Toilet Transfer Details (indicate cue type and reason): simulated bed>recliner Toileting- Clothing Manipulation and Hygiene: Total assistance Toileting - Clothing Manipulation Details (indicate cue type and reason): min A sit<>stand             Vision Ability to See in Adequate Light:  0 Adequate Patient Visual Report: No change from baseline              Pertinent Vitals/Pain Pain Assessment Pain Assessment: Faces Faces Pain Scale: Hurts even more Pain  Location: left shoulder with initially getting up OOB Pain Descriptors / Indicators: Sore, Aching, Grimacing Pain Intervention(s): Limited activity within patient's tolerance, Monitored during session, Repositioned, Premedicated before session, Ice applied     Extremity/Trunk Assessment Upper Extremity Assessment Upper Extremity Assessment: Right hand dominant;RUE deficits/detail RUE Deficits / Details: shoulder sx this admission--has WNL for elbow, forearm, wrist, and hand; able to get to neutral for external rotation RUE Coordination: decreased gross motor           Communication Communication Communication: No apparent difficulties   Cognition Arousal: Alert Behavior During Therapy: WFL for tasks assessed/performed Cognition: No apparent impairments                               Following commands: Intact       Cueing   Cueing Techniques: Verbal cues      Exercises Other Exercises Other Exercises: Pt completed 10 reps of elbow, forearm, wrist, hand (all 4 WNL) , and external rotation exercises (only able to achieve neutral)        Home Living Family/patient expects to be discharged to:: Private residence Living Arrangements: Alone Available Help at Discharge: Family;Available PRN/intermittently (available most of the day) Type of Home: House Home Access: Ramped entrance     Home Layout: One level     Bathroom Shower/Tub: Producer, television/film/video: Handicapped height     Home Equipment: Cane - single point;Grab bars - tub/shower;Hand held shower head;BSC/3in1;Wheelchair - manual;Shower seat;Rolling Environmental consultant (2 wheels)          Prior Functioning/Environment Prior Level of Function : Independent/Modified Independent;Driving             Mobility Comments: uses SPC, denies ambulatory falls ADLs Comments: I with ADLs    OT Problem List: Decreased strength;Decreased range of motion;Impaired balance (sitting and/or  standing);Pain;Obesity;Impaired UE functional use   OT Treatment/Interventions: Self-care/ADL training;Patient/family education;Balance training;DME and/or AE instruction;Therapeutic exercise      OT Goals(Current goals can be found in the care plan section)   Acute Rehab OT Goals Patient Stated Goal: to maybe go home tomorrow OT Goal Formulation: With patient/family Time For Goal Achievement: 11/29/23 Potential to Achieve Goals: Good   OT Frequency:  Min 3X/week       AM-PAC OT "6 Clicks" Daily Activity     Outcome Measure Help from another person eating meals?: A Little Help from another person taking care of personal grooming?: A Lot Help from another person toileting, which includes using toliet, bedpan, or urinal?: A Lot Help from another person bathing (including washing, rinsing, drying)?: Total Help from another person to put on and taking off regular upper body clothing?: A Lot Help from another person to put on and taking off regular lower body clothing?: Total 6 Click Score: 11   End of Session Equipment Utilized During Treatment: Gait belt (RUE immobilizer sling) Nurse Communication:  (pt reports she has already be A'd up with staff to [facility])  Activity Tolerance: Patient tolerated treatment well Patient left: in chair;with call bell/phone within reach  OT Visit Diagnosis: Unsteadiness on feet (R26.81);Other abnormalities of gait and mobility (R26.89);Muscle weakness (generalized) (M62.81);Pain Pain - Right/Left: Right Pain - part of body: Shoulder  Time: 0865-7846 OT Time Calculation (min): 51 min Charges:  OT General Charges $OT Visit: 1 Visit OT Evaluation $OT Eval Moderate Complexity: 1 Mod OT Treatments $Self Care/Home Management : 8-22 mins $Therapeutic Exercise: 8-22 mins  Lindon Romp OT Acute Rehabilitation Services Office [phone]    Evette Georges 11/15/2023, 11:21 AM

## 2023-11-15 NOTE — TOC Initial Note (Addendum)
 Transition of Care (TOC) - Initial/Assessment Note   Spoke to patient at bedside.   PT recommending OP PT and hemiwalker   OT recommending HHPT   NCM secure chatted Dr August Saucer , for clarification. Await response.   Patient voiced understanding.   1425 Received a call from Brandi with [facility]. Patient is active with them from last hospital stay for HHPT. They saw her for assessment visit , however PCP did not sign ongoing orders   Secure chatted Ralph Dowdy , plan for HHPT/OT , hemi walker . Asked for orders Clifton Custard with [facility] aware.   Ordered hemi walker with Mitch with [facility]  Patient Details  Name: Marilyn [name] MRN: [id number] Date of Birth: August 07, 1971  Transition of Care [facility]) CM/SW Contact:    Kingsley Plan, RN Phone Number: 11/15/2023, 1:48 PM  Clinical Narrative:                   Expected Discharge Plan:  (see note) Barriers to Discharge: Continued Medical Work up   Patient Goals and CMS Choice Patient states their goals for this hospitalization and ongoing recovery are:: to return to home CMS Medicare.gov Compare Post Acute Care list provided to::  (see note)        Expected Discharge Plan and Services In-house Referral: NA Discharge Planning Services: CM Consult Post Acute Care Choice:  (see note)                   DME Arranged:  (see note)         HH Arranged:  (see note) HH Agency:  (see note)        Prior Living Arrangements/Services   Lives with:: Relatives Patient language and need for interpreter reviewed:: Yes Do you feel safe going back to the place where you live?: Yes      Need for Family Participation in Patient Care: Yes (Comment) Care giver support system in place?: Yes (comment)   Criminal Activity/Legal Involvement Pertinent to Current Situation/Hospitalization: No - Comment as needed  Activities of Daily Living   ADL Screening (condition at time of admission) Independently performs ADLs?: Yes  (appropriate for developmental age) Is the patient deaf or have difficulty hearing?: Yes Does the patient have difficulty seeing, even when wearing glasses/contacts?: Yes Does the patient have difficulty concentrating, remembering, or making decisions?: Yes  Permission Sought/Granted   Permission granted to share information with : No              Emotional Assessment Appearance:: Appears stated age Attitude/Demeanor/Rapport: Engaged Affect (typically observed): Appropriate Orientation: : Oriented to Self, Oriented to Place, Oriented to  Time, Oriented to Situation Alcohol / Substance Use: Not Applicable Psych Involvement: No (comment)  Admission diagnosis:  OA (osteoarthritis) of shoulder [M19.019] S/P reverse total shoulder arthroplasty, right [Z96.611] Patient Active Problem List   Diagnosis Date Noted   OA (osteoarthritis) of shoulder 11/14/2023   S/P reverse total shoulder arthroplasty, right 11/14/2023   AKI (acute kidney injury) (HCC) 10/12/2023   Acute urinary retention 10/12/2023   Acute renal failure superimposed on stage 3a chronic kidney disease (HCC) 03/31/2023   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   Acute metabolic encephalopathy 03/31/2023   Lactic acidosis 12/18/2021   Dyspnea on exertion 12/18/2021   Anemia 12/18/2021   Gout 12/18/2021   Acute pain of left shoulder    Arthritis of left shoulder region    S/P reverse total shoulder arthroplasty, left 11/16/2021   Unilateral primary osteoarthritis, right hip 05/09/2021  S/P lumbar spinal fusion 05/09/2021   S/P insertion of spinal cord stimulator 05/09/2021   H/O total knee replacement, bilateral 01/18/2021   Lumbar post-laminectomy syndrome 04/06/2020   Long-term current use of opiate analgesic 11/04/2019   Leg pain, right 10/20/2019   Dysesthesia 10/20/2019   Lateral femoral cutaneous neuropathy, right 10/20/2019   Other spondylosis with radiculopathy, lumbar region    Status post lumbar laminectomy  06/23/2019   C. difficile colitis 05/22/2019   Hypophosphatemia 05/21/2019   Syncope 05/20/2019   OSA on CPAP    Acute gastroenteritis    Anemia due to blood loss, acute 06/18/2018    Class: Acute   Plantar fasciitis of left foot 06/16/2018    Class: Chronic   Tendonitis, Achilles, right 06/16/2018    Class: Chronic   Osteochondral talar dome lesion 06/16/2018    Class: Chronic   Deep incisional surgical site infection    Superficial incisional surgical site infection 06/11/2018    Class: Acute   Right ankle effusion 06/11/2018    Class: Acute   Morbid (severe) obesity due to excess calories (HCC) 05/29/2018    Class: Chronic   Spondylolisthesis, lumbar region 05/27/2018    Class: Chronic   Spinal stenosis of lumbar region 05/27/2018    Class: Chronic   Urinary tract infection 05/27/2018    Class: Acute   Fusion of spine of lumbar region 05/27/2018   Pain of right hip joint 07/03/2017   Back pain 03/27/2017   Chronic right shoulder pain 02/13/2017   History of rotator cuff tear 11/30/2016   Impingement syndrome of right shoulder 11/30/2016   Exacerbation of asthma 07/18/2016   Acute hypokalemia 07/18/2016   Hypertension 07/18/2016   Depression with anxiety 07/18/2016   Hypothyroidism 07/18/2016   Hyperglycemia 07/18/2016   Asthma, chronic 07/18/2016   Surgical wound dehiscence left hip; questionable superficial infection 11/10/2015   Postoperative wound infection 11/10/2015   Osteoarthritis of left hip 09/09/2015   Status post total replacement of left hip 09/09/2015   Obesity (BMI 35.0-39.9 without comorbidity) 04/28/2013   PCP:  Rebecka Apley, NP Pharmacy:   [facility] [address] [place] - [address] [place] [address] Phone: [phone] Fax: [phone]     Social Drivers of Health (SDOH) Social History: SDOH Screenings   Food Insecurity: Patient Unable To Answer (11/14/2023)  Housing: Patient Unable To Answer  (11/14/2023)  Transportation Needs: Patient Unable To Answer (11/14/2023)  Utilities: Patient Unable To Answer (11/14/2023)  Depression (PHQ2-9): Low Risk  (09/18/2018)  Financial Resource Strain: Low Risk  (11/05/2023)   Received from [facility]  Physical Activity: Inactive (04/21/2023)   Received from [facility]  Social Connections: Socially Isolated (04/21/2023)   Received from [facility]  Stress: Stress Concern Present (04/21/2023)   Received from [facility]  Tobacco Use: Medium Risk (11/14/2023)   SDOH Interventions:     Readmission Risk Interventions    10/16/2023   11:53 AM  Readmission Risk Prevention Plan  Transportation Screening Complete  PCP or Specialist Appt within 5-7 Days Complete  Home Care Screening Complete  Medication Review (RN CM) Complete

## 2023-11-15 NOTE — Evaluation (Signed)
 Physical Therapy Evaluation Patient Details Name: Marilyn [name] MRN: [id number] DOB: 1971/05/25 Today's Date: 11/15/2023  History of Present Illness  53 y/o F admitted 2/27 for Rt reverse TSA. PMHx: spinal stenosis, s/p lumbar lami and fusion, OA, morbid obesity, GERD, asthma, anxiety, HTN, OSA, hypothyroidism.  Clinical Impression  Pt pleasant and reports Rt foot numbness. Pt does not recall paraesthesias she experienced during 1/28 admission and reports they had resolved prior to sx. Pt with intact motor function and foot clearance with gait but benefit from hemiwalker for stability and recommend continued use. Pt also fatigues quickly with gait and would benefit from OPPT to improve activity tolerance and balance. Will follow acutely to decrease burden of care.         If plan is discharge home, recommend the following: A little help with walking and/or transfers;Assistance with cooking/housework;Assist for transportation   Can travel by private vehicle        Equipment Recommendations Other (comment) Psychologist, educational)  Recommendations for Other Services       Functional Status Assessment Patient has had a recent decline in their functional status and/or demonstrates limited ability to make significant improvements in function in a reasonable and predictable amount of time     Precautions / Restrictions Precautions Precautions: Fall;Other (comment) Type of Shoulder Precautions: Sling At all times, VAC Shoulder Interventions: Shoulder sling/immobilizer Recall of Precautions/Restrictions: Intact Required Braces or Orthoses: Sling Restrictions RUE Weight Bearing Per Provider Order: Non weight bearing      Mobility  Bed Mobility               General bed mobility comments: pt in recliner on arrival and end of session    Transfers Overall transfer level: Needs assistance   Transfers: Sit to/from Stand Sit to Stand: Contact guard assist           General transfer  comment: cues for hand placement with pt able to rise without physical assist    Ambulation/Gait Ambulation/Gait assistance: Contact guard assist Gait Distance (Feet): 80 Feet Assistive device: Hemi-walker Gait Pattern/deviations: Step-through pattern, Decreased stride length   Gait velocity interpretation: 1.31 - 2.62 ft/sec, indicative of limited community ambulator   General Gait Details: pt with cues for placement and sequence of hemiwalker, clearing both feet with stepping but reports continued numbness rt foot. Pt limited by fatigue and needed chair pulled to her end of gait  Stairs            Wheelchair Mobility     Tilt Bed    Modified Rankin (Stroke Patients Only)       Balance Overall balance assessment: Needs assistance Sitting-balance support: No upper extremity supported, Feet supported Sitting balance-Leahy Scale: Good     Standing balance support: Single extremity supported Standing balance-Leahy Scale: Poor Standing balance comment: hemiwalker in standing                             Pertinent Vitals/Pain Pain Assessment Pain Assessment: 0-10 Pain Score: 5  Pain Location: Rt shoulder 5/10, Rt foot 4/10 Pain Descriptors / Indicators: Sore, Aching Pain Intervention(s): Limited activity within patient's tolerance, Monitored during session, Repositioned    Home Living Family/patient expects to be discharged to:: Private residence Living Arrangements: Parent;Children Available Help at Discharge: Family;Available 24 hours/day Type of Home: House Home Access: Ramped entrance       Home Layout: One level Home Equipment: Cane - single point;Grab bars - tub/shower;Hand held  shower head;BSC/3in1;Wheelchair - manual;Shower seat;Rolling Walker (2 wheels);Cane - quad      Prior Function Prior Level of Function : Independent/Modified Independent;Driving             Mobility Comments: uses SPC, denies ambulatory falls ADLs Comments: I  with ADLs     Extremity/Trunk Assessment   Upper Extremity Assessment Upper Extremity Assessment: Defer to OT evaluation RUE Deficits / Details: shoulder sx this admission--has WNL for elbow, forearm, wrist, and hand; able to get to neutral for external rotation RUE Coordination: decreased gross motor    Lower Extremity Assessment Lower Extremity Assessment: Generalized weakness    Cervical / Trunk Assessment Cervical / Trunk Assessment: Other exceptions Cervical / Trunk Exceptions: increased body habitus  Communication   Communication Communication: No apparent difficulties    Cognition Arousal: Alert Behavior During Therapy: WFL for tasks assessed/performed   PT - Cognitive impairments: No apparent impairments                         Following commands: Intact       Cueing Cueing Techniques: Verbal cues     General Comments      Exercises     Assessment/Plan    PT Assessment Patient needs continued PT services  PT Problem List Decreased strength;Decreased activity tolerance;Decreased balance;Decreased mobility;Decreased knowledge of use of DME       PT Treatment Interventions DME instruction;Gait training;Functional mobility training;Therapeutic activities;Patient/family education    PT Goals (Current goals can be found in the Care Plan section)  Acute Rehab PT Goals Patient Stated Goal: return home PT Goal Formulation: With patient/family Time For Goal Achievement: 11/22/23 Potential to Achieve Goals: Good    Frequency Min 1X/week     Co-evaluation               AM-PAC PT "6 Clicks" Mobility  Outcome Measure Help needed turning from your back to your side while in a flat bed without using bedrails?: A Little Help needed moving from lying on your back to sitting on the side of a flat bed without using bedrails?: A Little Help needed moving to and from a bed to a chair (including a wheelchair)?: A Little Help needed standing up from  a chair using your arms (e.g., wheelchair or bedside chair)?: A Little Help needed to walk in hospital room?: A Little Help needed climbing 3-5 steps with a railing? : A Little 6 Click Score: 18    End of Session Equipment Utilized During Treatment: Gait belt;Other (comment) (sling) Activity Tolerance: Patient tolerated treatment well Patient left: in chair;with call bell/phone within reach;with family/visitor present Nurse Communication: Mobility status;Precautions PT Visit Diagnosis: Other abnormalities of gait and mobility (R26.89);Difficulty in walking, not elsewhere classified (R26.2)    Time: 4098-1191 PT Time Calculation (min) (ACUTE ONLY): 20 min   Charges:   PT Evaluation $PT Eval Low Complexity: 1 Low   PT General Charges $$ ACUTE PT VISIT: 1 Visit         Merryl Hacker, PT Acute Rehabilitation Services Office: [phone]   Bayleigh Loflin B Madline Oesterling 11/15/2023, 1:20 PM

## 2023-11-15 NOTE — Progress Notes (Signed)
 Marilyn [name] was at [facility] on 11/14/2023 helping to take care of of his mother who underwent major shoulder surgery.  Please excuse him from work on that day while he was at the hospital caring for his mother.  Thank you  Burnard Bunting, MD

## 2023-11-15 NOTE — Progress Notes (Signed)
  Subjective: Patient stable.  Pain controlled   Objective: Vital signs in last 24 hours: Temp:  [97.6 F (36.4 C)-99 F (37.2 C)] 99 F (37.2 C) (02/28 0432) Pulse Rate:  [64-81] 71 (02/28 0432) Resp:  [16-21] 18 (02/28 0432) BP: (115-195)/(52-88) 129/52 (02/28 0432) SpO2:  [91 %-100 %] 99 % (02/28 0432)  Intake/Output from previous day: 02/27 0701 - 02/28 0700 In: 1920 [P.O.:270; I.V.:800; IV Piggyback:850] Out: 1025 [Urine:700; Stool:250; Blood:75] Intake/Output this shift: No intake/output data recorded.  Exam:  Intact pulses distally No cellulitis present  Labs: No results for input(s): "HGB" in the last 72 hours. No results for input(s): "WBC", "RBC", "HCT", "PLT" in the last 72 hours. No results for input(s): "NA", "K", "CL", "CO2", "BUN", "CREATININE", "GLUCOSE", "CALCIUM" in the last 72 hours. No results for input(s): "LABPT", "INR" in the last 72 hours.  Assessment/Plan: Plan at this time is occupational therapy and physical therapy today.  Okay for a little shoulder range of motion but mostly wrist and elbow range of motion and sling education.  Also would like for her to see physical therapy to assess how she is in terms of ambulating.  She did go through this with the left shoulder before.  Anticipate discharge tomorrow.  Incisional VAC in place.  The closure yesterday was very good and therefore we can start moving the shoulder some but not quite as much as a standard shoulder replacement in order to allow for that soft tissue incisional healing.   G Scott Perfecto Purdy 11/15/2023, 8:16 AM

## 2023-11-15 NOTE — Care Management Obs Status (Signed)
 MEDICARE OBSERVATION STATUS NOTIFICATION   Patient Details  Name: Marilyn [name] MRN: [id number] Date of Birth: 20-Aug-1971   Medicare Observation Status Notification Given:  Yes    Kingsley Plan, RN 11/15/2023, 1:45 PM

## 2023-11-15 NOTE — Plan of Care (Signed)

## 2023-11-16 DIAGNOSIS — M19011 Primary osteoarthritis, right shoulder: Secondary | ICD-10-CM | POA: Diagnosis not present

## 2023-11-16 MED ORDER — CHLORHEXIDINE GLUCONATE 4 % EX SOLN
CUTANEOUS | 1 refills | Status: AC
Start: 2023-11-16 — End: ?

## 2023-11-16 MED ORDER — CELECOXIB 100 MG PO CAPS: 100.0000 mg | ORAL_CAPSULE | Freq: Two times a day (BID) | ORAL | 0 refills | Status: AC

## 2023-11-16 MED ORDER — MUPIROCIN 2 % EX OINT: 1.0000 | TOPICAL_OINTMENT | Freq: Two times a day (BID) | CUTANEOUS | 0 refills | Status: AC

## 2023-11-16 MED ORDER — ASPIRIN 81 MG PO TBEC: 81.0000 mg | DELAYED_RELEASE_TABLET | Freq: Every day | ORAL | 12 refills | Status: AC

## 2023-11-16 MED ORDER — OXYCODONE HCL 5 MG PO TABS: 5.0000 mg | ORAL_TABLET | ORAL | 0 refills | Status: DC | PRN

## 2023-11-16 MED ORDER — DOCUSATE SODIUM 100 MG PO CAPS: 100.0000 mg | ORAL_CAPSULE | Freq: Two times a day (BID) | ORAL | 0 refills | Status: AC

## 2023-11-16 NOTE — Progress Notes (Signed)
 Placed Aquacel dressing over R shoulder surgical incision site.

## 2023-11-16 NOTE — TOC Transition Note (Signed)
 Transition of Care [facility]) - Discharge Note   Patient Details  Name: Marilyn [name] MRN: [id number] Date of Birth: 10-30-1970  Transition of [facility]) CM/SW Contact:  Ronny Bacon, RN Phone Number: 11/16/2023, 9:51 AM   Clinical Narrative:   Patient is being discharged today. Katina with [facility] made aware.    Final next level of care: Home w Home Health Services Barriers to Discharge: No Barriers Identified   Patient Goals and CMS Choice Patient states their goals for this hospitalization and ongoing recovery are:: to return to home CMS Medicare.gov Compare Post Acute Care list provided to::  (see note)        Discharge Placement                       Discharge Plan and Services Additional resources added to the After Visit Summary for   In-house Referral: NA Discharge Planning Services: CM Consult Post Acute Care Choice:  (see note)          DME Arranged:  (see note)         HH Arranged:  (see note) HH Agency:  (see note)        Social Drivers of Health (SDOH) Interventions SDOH Screenings   Food Insecurity: Patient Unable To Answer (11/14/2023)  Housing: Patient Unable To Answer (11/14/2023)  Transportation Needs: Patient Unable To Answer (11/14/2023)  Utilities: Patient Unable To Answer (11/14/2023)  Depression (PHQ2-9): Low Risk  (09/18/2018)  Financial Resource Strain: Low Risk  (11/05/2023)   Received from [facility]  Physical Activity: Inactive (04/21/2023)   Received from [facility]  Social Connections: Socially Isolated (04/21/2023)   Received from [facility]  Stress: Stress Concern Present (04/21/2023)   Received from [facility]  Tobacco Use: Medium Risk (11/14/2023)     Readmission Risk Interventions    10/16/2023   11:53 AM  Readmission Risk Prevention Plan  Transportation Screening Complete  PCP or Specialist Appt within 5-7 Days Complete  Home Care Screening Complete  Medication Review (RN CM) Complete

## 2023-11-16 NOTE — Progress Notes (Signed)
 Called and s/w Dr. Ophelia Charter to clarify need for Prevena/vac but he stated she did not need it for home and he stated to place an Aquacel dressing on the incision after DCing the VAC dressing and suction. Pt has all DME and dtr is with pt and has cared for her for years at home. Pt states did well with PT with hemi walker. DC AVS copy given and explained.

## 2023-11-16 NOTE — Progress Notes (Signed)
 Occupational Therapy Treatment Patient Details Name: Marilyn [name] MRN: [id number] DOB: [birth year] Today's Date: 11/16/2023   History of present illness 53 y/o F admitted 2/27 for Rt reverse TSA. PMHx: spinal stenosis, s/p lumbar lami and fusion, OA, morbid obesity, GERD, asthma, anxiety, HTN, OSA, hypothyroidism.   OT comments  Pt progressing toward established OT goals with family in room preparing to take pt home. Pt with AROM of elbow/wrist/hand WFL. Pt needing significant assist with family present assisting for BADL. Pt aware of precautions and sling wear schedule with no questions at this time.       If plan is discharge home, recommend the following:  A little help with walking and/or transfers;A lot of help with bathing/dressing/bathroom;Assistance with cooking/housework;Help with stairs or ramp for entrance;Assist for transportation   Equipment Recommendations  None recommended by OT    Recommendations for Other Services      Precautions / Restrictions Precautions Precautions: Fall;Other (comment) Type of Shoulder Precautions: no AROM shoulder; AROM elbow/wrist/hand Shoulder Interventions: Shoulder sling/immobilizer Precaution Booklet Issued: Yes (comment) Recall of Precautions/Restrictions: Intact Required Braces or Orthoses: Sling Restrictions Weight Bearing Restrictions Per Provider Order: Yes RUE Weight Bearing Per Provider Order: Non weight bearing       Mobility Bed Mobility               General bed mobility comments: OOB in recliner    Transfers Overall transfer level: Needs assistance Equipment used: 1 person hand held assist Transfers: Sit to/from Stand Sit to Stand: Contact guard assist           General transfer comment: cues for hand placement     Balance                                           ADL either performed or assessed with clinical judgement   ADL                   Upper Body Dressing : With  caregiver independent assisting Upper Body Dressing Details (indicate cue type and reason): caregiver in room independent assisting Lower Body Dressing: With caregiver independent assisting                      Extremity/Trunk Assessment Upper Extremity Assessment Upper Extremity Assessment: RUE deficits/detail RUE Deficits / Details: shoulder sx this admission--has WNL for elbow, forearm, wrist, and hand; able to get to neutral for external rotation RUE Coordination: decreased gross motor   Lower Extremity Assessment Lower Extremity Assessment: Defer to PT evaluation        Vision       Perception     Praxis     Communication Communication Communication: No apparent difficulties   Cognition Arousal: Alert Behavior During Therapy: WFL for tasks assessed/performed Cognition: No apparent impairments                               Following commands: Intact        Cueing   Cueing Techniques: Verbal cues  Exercises Exercises: Other exercises Other Exercises Other Exercises: Pt completed 10 reps of elbow, forearm, wrist, hand (all 4 WNL) , and external rotation exercises (only able to achieve neutral); provided handout    Shoulder Instructions       General Comments  Pertinent Vitals/ Pain       Pain Assessment Pain Assessment: Faces Faces Pain Scale: Hurts little more Pain Location: R shoulder Pain Descriptors / Indicators: Sore, Aching Pain Intervention(s): Limited activity within patient's tolerance, Monitored during session  Home Living                                          Prior Functioning/Environment              Frequency           Progress Toward Goals  OT Goals(current goals can now be found in the care plan section)  Progress towards OT goals: Progressing toward goals  Acute Rehab OT Goals Patient Stated Goal: go home OT Goal Formulation: With patient/family Time For Goal Achievement:  11/29/23 Potential to Achieve Goals: Good  Plan      Co-evaluation                 AM-PAC OT "6 Clicks" Daily Activity     Outcome Measure   Help from another person eating meals?: A Little Help from another person taking care of personal grooming?: A Lot Help from another person toileting, which includes using toliet, bedpan, or urinal?: A Lot Help from another person bathing (including washing, rinsing, drying)?: Total Help from another person to put on and taking off regular upper body clothing?: A Lot Help from another person to put on and taking off regular lower body clothing?: Total 6 Click Score: 11    End of Session Equipment Utilized During Treatment: Other (comment) (RUE sling)  OT Visit Diagnosis: Unsteadiness on feet (R26.81);Other abnormalities of gait and mobility (R26.89);Muscle weakness (generalized) (M62.81);Pain Pain - Right/Left: Right Pain - part of body: Shoulder   Activity Tolerance Patient tolerated treatment well   Patient Left in chair;with call bell/phone within reach   Nurse Communication Mobility status        Time: 1530-1540 OT Time Calculation (min): 10 min  Charges: OT General Charges $OT Visit: 1 Visit OT Treatments $Therapeutic Exercise: 8-22 mins  Tyler Deis, OTR/L [facility] Office: [phone]   Marilyn [name] 11/16/2023, 3:45 PM

## 2023-11-16 NOTE — Progress Notes (Signed)
 Patient ID: Marilyn [name], female   DOB: 01-27-1971, 53 y.o.   MRN: [id number] Eating breakfast pancakes. Has not been OOB today. Once safe ambulation to BR then she can be discharged home. Parents at home. ROV to see Dr. August Saucer as scheduled.

## 2023-11-16 NOTE — Plan of Care (Signed)
  Problem: Education: Goal: Knowledge of General Education information will improve Description: Including pain rating scale, medication(s)/side effects and non-pharmacologic comfort measures Outcome: Progressing   Problem: Health Behavior/Discharge Planning: Goal: Ability to manage health-related needs will improve Outcome: Progressing   Problem: Pain Managment: Goal: General experience of comfort will improve and/or be controlled Outcome: Progressing   Problem: Activity: Goal: Ability to tolerate increased activity will improve Outcome: Progressing

## 2023-11-21 DIAGNOSIS — M19011 Primary osteoarthritis, right shoulder: Secondary | ICD-10-CM

## 2023-11-25 ENCOUNTER — Telehealth: Payer: Self-pay | Admitting: Orthopedic Surgery

## 2023-11-25 NOTE — Telephone Encounter (Signed)
 Yes  - ot for shoulder pendulums 30 reps 3 x a day until rov thx

## 2023-11-25 NOTE — Telephone Encounter (Signed)
 Marilyn [name] with [facility] requesting verbal orders home health PT: 1 week 1 & 2 week 2   Pt requesting order for a lift chair    Marilyn [name]'s call back number [phone]

## 2023-11-26 NOTE — Telephone Encounter (Signed)
 IC no answer. LMVM for [facility] advising per Dr August Saucer

## 2023-11-27 NOTE — Discharge Summary (Signed)
 Physician Discharge Summary      Patient ID: Marilyn [name] MRN: [id number] DOB/AGE: 1970/09/24 53 y.o.  Admit date: 11/14/2023 Discharge date: 11/16/2023  Admission Diagnoses:  Principal Problem:   OA (osteoarthritis) of shoulder Active Problems:   S/P reverse total shoulder arthroplasty, right   Arthritis of right shoulder region   Discharge Diagnoses:  Same  Surgeries: Procedure(s): RIGHT REVERSE SHOULDER ARTHROPLASTY on 11/14/2023   Consultants:   Discharged Condition: Stable  Hospital Course: Marilyn [name] is an 53 y.o. female who was admitted 11/14/2023 with a chief complaint of right shoulder pain, and found to have a diagnosis of right shoulder arthritis.  They were brought to the operating room on 11/14/2023 and underwent the above named procedures.  Pt awoke from anesthesia without complication and was transferred to the floor. On POD1, patient's pain was overall controlled.  She did have some numbness in her feet that did improve over the course of her hospitalization.  She improved her mobility and pain control by 11/16/2023 to the point where she felt comfortable for discharge home.  She was discharged home and plan to follow-up with me in clinic 2 weeks postop.   Antibiotics given:  Anti-infectives (From admission, onward)    Start     Dose/Rate Route Frequency Ordered Stop   11/14/23 2000  ceFAZolin (ANCEF) IVPB 2g/100 mL premix        2 g 200 mL/hr over 30 Minutes Intravenous Every 8 hours 11/14/23 1411 11/15/23 1526   11/14/23 1030  vancomycin (VANCOCIN) powder  Status:  Discontinued          As needed 11/14/23 1031 11/14/23 1237   11/14/23 0700  ceFAZolin (ANCEF) IVPB 3g/150 mL premix        3 g 300 mL/hr over 30 Minutes Intravenous On call to O.R. 11/14/23 0605 11/14/23 1248   11/14/23 0700  vancomycin (VANCOREADY) IVPB 1500 mg/300 mL        1,500 mg 150 mL/hr over 120 Minutes Intravenous On call to O.R. 11/14/23 6440 11/14/23 0904     .  Recent  vital signs:  Vitals:   11/16/23 0622 11/16/23 0742  BP: (!) 134/55 (!) 132/48  Pulse: 73 73  Resp: 18 17  Temp: 99 F (37.2 C) 98.8 F (37.1 C)  SpO2: 97% 98%    Recent laboratory studies:  Results for orders placed or performed during the hospital encounter of 11/08/23  Surgical pcr screen   Collection Time: 11/08/23 10:38 AM   Specimen: Nasal Mucosa; Nasal Swab  Result Value Ref Range   MRSA, PCR POSITIVE (A) NEGATIVE   Staphylococcus aureus POSITIVE (A) NEGATIVE  CBC   Collection Time: 11/08/23 10:38 AM  Result Value Ref Range   WBC 9.6 4.0 - 10.5 K/uL   RBC 4.15 3.87 - 5.11 MIL/uL   Hemoglobin 11.9 (L) 12.0 - 15.0 g/dL   HCT 34.7 42.5 - 95.6 %   MCV 91.1 80.0 - 100.0 fL   MCH 28.7 26.0 - 34.0 pg   MCHC 31.5 30.0 - 36.0 g/dL   RDW 38.7 56.4 - 33.2 %   Platelets 264 150 - 400 K/uL   nRBC 0.0 0.0 - 0.2 %  Basic metabolic panel   Collection Time: 11/08/23 10:38 AM  Result Value Ref Range   Sodium 137 135 - 145 mmol/L   Potassium 3.1 (L) 3.5 - 5.1 mmol/L   Chloride 102 98 - 111 mmol/L   CO2 27 22 - 32 mmol/L   Glucose,  Bld 82 70 - 99 mg/dL   BUN 22 (H) 6 - 20 mg/dL   Creatinine, Ser 2.95 (H) 0.44 - 1.00 mg/dL   Calcium 9.2 8.9 - 62.1 mg/dL   GFR, Estimated 57 (L) >60 mL/min   Anion gap 8 5 - 15  Urinalysis, w/ Reflex to Culture (Infection Suspected) -Urine, Clean Catch   Collection Time: 11/08/23 12:20 PM  Result Value Ref Range   Specimen Source URINE, CLEAN CATCH    Color, Urine YELLOW YELLOW   APPearance CLEAR CLEAR   Specific Gravity, Urine 1.021 1.005 - 1.030   pH 5.0 5.0 - 8.0   Glucose, UA NEGATIVE NEGATIVE mg/dL   Hgb urine dipstick NEGATIVE NEGATIVE   Bilirubin Urine NEGATIVE NEGATIVE   Ketones, ur NEGATIVE NEGATIVE mg/dL   Protein, ur NEGATIVE NEGATIVE mg/dL   Nitrite NEGATIVE NEGATIVE   Leukocytes,Ua NEGATIVE NEGATIVE   RBC / HPF 0-5 0 - 5 RBC/hpf   WBC, UA 0-5 0 - 5 WBC/hpf   Bacteria, UA NONE SEEN NONE SEEN   Squamous Epithelial / HPF 0-5 0  - 5 /HPF   Mucus PRESENT    *Note: Due to a large number of results and/or encounters for the requested time period, some results have not been displayed. A complete set of results can be found in Results Review.    Discharge Medications:   Allergies as of 11/16/2023       Reactions   Lisinopril Anaphylaxis   Angioedema   Bee Venom Swelling, Other (See Comments)   Swelling at the site   Bupropion Swelling   Latex Itching, Rash   Propoxyphene Hives   Methadone Other (See Comments)   unknown   Codeine Nausea Only   Meloxicam Other (See Comments)   Insomnia, constipation   Morphine And Codeine Rash   Tegaderm Ag Mesh [silver] Hives, Itching, Rash   Tomato Rash        Medication List     TAKE these medications    albuterol 108 (90 Base) MCG/ACT inhaler Commonly known as: VENTOLIN HFA Inhale 1-2 puffs into the lungs every 6 (six) hours as needed for wheezing or shortness of breath.   allopurinol 100 MG tablet Commonly known as: ZYLOPRIM Take 1 tablet (100 mg total) by mouth 2 (two) times daily.   amitriptyline 25 MG tablet Commonly known as: ELAVIL Take 25 mg by mouth at bedtime.   aspirin EC 81 MG tablet Take 1 tablet (81 mg total) by mouth daily. Swallow whole.   budesonide 0.5 MG/2ML nebulizer solution Commonly known as: PULMICORT Take 0.5 mg by nebulization 2 (two) times daily as needed.   buPROPion 150 MG 24 hr tablet Commonly known as: WELLBUTRIN XL Take 150 mg by mouth daily. Take with 300 mg to equal 450   buPROPion 300 MG 24 hr tablet Commonly known as: WELLBUTRIN XL Take 300 mg by mouth daily. Take with 150 mg to equal 450 mg   celecoxib 100 MG capsule Commonly known as: CELEBREX Take 1 capsule (100 mg total) by mouth 2 (two) times daily.   chlorhexidine 4 % external liquid Commonly known as: HIBICLENS Apply topically as directed for 30 doses. Use as directed daily for 5 days every other week for 6 weeks.   cyclobenzaprine 10 MG tablet Commonly  known as: FLEXERIL Take 1 tablet by mouth 3 (three) times daily as needed.   cycloSPORINE 0.05 % ophthalmic emulsion Commonly known as: RESTASIS Place 1 drop into both eyes 2 (two) times daily  as needed (dry eyes).   docusate sodium 100 MG capsule Commonly known as: COLACE Take 1 capsule (100 mg total) by mouth 2 (two) times daily.   DULoxetine 60 MG capsule Commonly known as: CYMBALTA Take 60 mg by mouth 2 (two) times daily.   Ferrous Gluconate 324 (37.5 Fe) MG Tabs Take 324 mg by mouth daily.   furosemide 20 MG tablet Commonly known as: LASIX Take 20 mg by mouth 2 (two) times daily as needed for fluid or edema.   hydrochlorothiazide 25 MG tablet Commonly known as: HYDRODIURIL Take 25 mg by mouth daily.   ipratropium-albuterol 0.5-2.5 (3) MG/3ML Soln Commonly known as: DUONEB Inhale 3 mLs into the lungs every 4 (four) hours as needed (SOB/Wheezing).   levothyroxine 150 MCG tablet Commonly known as: SYNTHROID Take 150 mcg by mouth daily before breakfast.   mupirocin ointment 2 % Commonly known as: BACTROBAN Place 1 Application into the nose 2 (two) times daily for 60 doses. Use as directed 2 times daily for 5 days every other week for 6 weeks.   naloxone 4 MG/0.1ML Liqd nasal spray kit Commonly known as: NARCAN Place into the nose.   nystatin powder Commonly known as: MYCOSTATIN/NYSTOP Apply 1 Application topically 3 (three) times daily.   omeprazole 40 MG capsule Commonly known as: PRILOSEC Take 40 mg by mouth daily.   oxybutynin 15 MG 24 hr tablet Commonly known as: DITROPAN XL Take 15 mg by mouth daily.   oxyCODONE 5 MG immediate release tablet Commonly known as: Oxy IR/ROXICODONE Take 1 tablet (5 mg total) by mouth every 4 (four) hours as needed for moderate pain (pain score 4-6) (pain score 4-6).   oxyCODONE-acetaminophen 7.5-325 MG tablet Commonly known as: PERCOCET Take 1 tablet by mouth 3 (three) times daily as needed for moderate pain (pain score  4-6) or severe pain (pain score 7-10).   pilocarpine 5 MG tablet Commonly known as: SALAGEN Take 5 mg by mouth 3 (three) times daily.   pregabalin 100 MG capsule Commonly known as: LYRICA Take 100 mg by mouth 3 (three) times daily.   topiramate 25 MG tablet Commonly known as: TOPAMAX Take 25 mg by mouth daily.   traZODone 50 MG tablet Commonly known as: DESYREL Take 50-100 mg by mouth at bedtime.   vitamin B-12 250 MCG tablet Commonly known as: CYANOCOBALAMIN Take 250 mcg by mouth daily.   ZyrTEC Allergy 10 MG Caps Generic drug: Cetirizine HCl Take by mouth.        Diagnostic Studies: DG Shoulder Right Port Result Date: 11/14/2023 CLINICAL DATA:  Status post shoulder surgery. EXAM: RIGHT SHOULDER - 1 VIEW COMPARISON:  None Available. FINDINGS: Reverse right shoulder arthroplasty in expected alignment. No periprosthetic lucency or fracture. Recent postsurgical change includes air and edema in the joint space and soft tissues. IMPRESSION: Reverse right shoulder arthroplasty without immediate postoperative complication. Electronically Signed   By: Narda Rutherford M.D.   On: 11/14/2023 13:55    Disposition: Discharge disposition: 01-Home or Self Care       Discharge Instructions     Call MD / Call 911   Complete by: As directed    If you experience chest pain or shortness of breath, CALL 911 and be transported to the hospital emergency room.  If you develope a fever above 101 F, pus (white drainage) or increased drainage or redness at the wound, or calf pain, call your surgeon's office.   Constipation Prevention   Complete by: As directed  Drink plenty of fluids.  Prune juice may be helpful.  You may use a stool softener, such as Colace (over the counter) 100 mg twice a day.  Use MiraLax (over the counter) for constipation as needed.   Diet - low sodium heart healthy   Complete by: As directed    Discharge instructions   Complete by: As directed    You may shower,  dressing is waterproof.  Do not bathe or soak the operative shoulder in a tub, pool.  Use the CPM machine 3 times a day for one hour each time starting on 11/22/23.  No lifting with the operative shoulder. Continue use of the sling.  Call the office to schedule an appointment for Wednesday or Friday afternoon of next week in order to remove the postop wound VAC dressing and replace it with a new waterproof dressing.  Dental Antibiotics:  In most cases prophylactic antibiotics for Dental procdeures after total joint surgery are not necessary.  Exceptions are as follows:  1. History of prior total joint infection  2. Severely immunocompromised (Organ Transplant, cancer chemotherapy, Rheumatoid biologic meds such as Humera)  3. Poorly controlled diabetes (A1C &gt; 8.0, blood glucose over 200)  If you have one of these conditions, contact your surgeon for an antibiotic prescription, prior to your dental procedure.   Increase activity slowly as tolerated   Complete by: As directed    Post-operative opioid taper instructions:   Complete by: As directed    POST-OPERATIVE OPIOID TAPER INSTRUCTIONS: It is important to wean off of your opioid medication as soon as possible. If you do not need pain medication after your surgery it is ok to stop day one. Opioids include: Codeine, Hydrocodone(Norco, Vicodin), Oxycodone(Percocet, oxycontin) and hydromorphone amongst others.  Long term and even short term use of opiods can cause: Increased pain response Dependence Constipation Depression Respiratory depression And more.  Withdrawal symptoms can include Flu like symptoms Nausea, vomiting And more Techniques to manage these symptoms Hydrate well Eat regular healthy meals Stay active Use relaxation techniques(deep breathing, meditating, yoga) Do Not substitute Alcohol to help with tapering If you have been on opioids for less than two weeks and do not have pain than it is ok to stop all  together.  Plan to wean off of opioids This plan should start within one week post op of your joint replacement. Maintain the same interval or time between taking each dose and first decrease the dose.  Cut the total daily intake of opioids by one tablet each day Next start to increase the time between doses. The last dose that should be eliminated is the evening dose.           Follow-up Information     [facility] Follow up.   Specialty: [facility] Contact information: [address] [place] [address] [phone]         Cammy Copa, MD Follow up in 1 week(s).   Specialty: Orthopedic Surgery Contact information: [address] [place] [address] [phone]                  Signed: Karenann Cai 11/27/2023, 11:35 AM

## 2023-11-29 ENCOUNTER — Ambulatory Visit (INDEPENDENT_AMBULATORY_CARE_PROVIDER_SITE_OTHER): Payer: Medicare HMO | Admitting: Surgical

## 2023-11-29 ENCOUNTER — Other Ambulatory Visit (INDEPENDENT_AMBULATORY_CARE_PROVIDER_SITE_OTHER): Payer: Self-pay

## 2023-11-29 DIAGNOSIS — G8929 Other chronic pain: Secondary | ICD-10-CM | POA: Diagnosis not present

## 2023-11-29 DIAGNOSIS — M25511 Pain in right shoulder: Secondary | ICD-10-CM

## 2023-11-29 DIAGNOSIS — Z96611 Presence of right artificial shoulder joint: Secondary | ICD-10-CM

## 2023-11-29 MED ORDER — OXYCODONE HCL 5 MG PO TABS: 5.0000 mg | ORAL_TABLET | Freq: Four times a day (QID) | ORAL | 0 refills | Status: AC | PRN

## 2023-11-29 MED ORDER — DOXYCYCLINE HYCLATE 100 MG PO CAPS: 100.0000 mg | ORAL_CAPSULE | Freq: Two times a day (BID) | ORAL | 0 refills | Status: AC

## 2023-11-29 NOTE — Progress Notes (Signed)
 Post-Op Visit Note   Patient: Marilyn [name]           Date of Birth: 02/09/1971           MRN: [id number] Visit Date: 11/29/2023 PCP: Rebecka Apley, NP   Assessment & Plan:  Chief Complaint: No chief complaint on file.  Visit Diagnoses:  1. S/P reverse total shoulder arthroplasty, right   2. Chronic right shoulder pain     Plan: Marilyn [name] is a 53 y.o. female who presents s/p right reverse shoulder arthroplasty on 11/14/2023.  Patient is doing well and pain is overall controlled.  Denies any chest pain, SOB, fevers, chills.  No complaint of any instability symptoms.  Bandage removed today.  Pain level is about 5/10.  Does have to take oxycodone.  She is not having any shortness of breath with ambulation like she did with her last procedure.  She is continuing to use the nasal mupirocin as well as washing with Hibiclens.    On exam, patient has range of motion deferred to next visit due to incision.  Intact EPL, FPL, finger abduction, finger adduction, pronation/supination, bicep, tricep, deltoid of operative extremity.  Axillary nerve intact with deltoid firing.  Incision is healing well all throughout except for 1 spot about 1 fingerbreadth up from the distal aspect of the incision where there is some scant expressible drainage.  There is no surrounding erythema..  Incision was made sure to be covered with Steri-Strips from the proximal to distal aspect of the length of the incision; the one area of gapping at the distal portion incision was left open to be covered with a Band-Aid to be changed daily.  2+ radial pulse of the operative extremity  Plan is continue with the sling and okay to come out of the sling several times through the day to start pendulum exercises.  Main concern at this point is making sure her incision heals completely so we can avoid any sort of repeat irrigation and debridement as she had on her contralateral shoulder.  Prescribed doxycycline to take  twice daily for 10 days and refilled her pain medication.  Will see her back in 1 week for clinical recheck regarding her incision.  Call with any concerns in the meantime.  Follow-Up Instructions: No follow-ups on file.   Orders:  No orders of the defined types were placed in this encounter.  No orders of the defined types were placed in this encounter.   Imaging: No results found.  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   Acute metabolic encephalopathy 03/31/2023   Lactic acidosis 12/18/2021   Dyspnea on exertion 12/18/2021   Anemia 12/18/2021   Gout 12/18/2021   Acute pain of left shoulder    Arthritis of left shoulder region    S/P reverse total shoulder arthroplasty, left 11/16/2021   Unilateral primary osteoarthritis, right hip 05/09/2021   S/P lumbar spinal fusion 05/09/2021   S/P insertion of spinal cord stimulator 05/09/2021   H/O total knee replacement, bilateral 01/18/2021   Lumbar post-laminectomy syndrome 04/06/2020   Long-term current use of opiate analgesic 11/04/2019   Leg  pain, right 10/20/2019   Dysesthesia 10/20/2019   Lateral femoral cutaneous neuropathy, 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 difficile colitis 05/22/2019   Hypophosphatemia 05/21/2019   Syncope 05/20/2019   OSA on CPAP    Acute gastroenteritis    Anemia due to blood loss, acute 06/18/2018    Class: Acute   Plantar fasciitis of left foot 06/16/2018    Class: Chronic   Tendonitis, Achilles, right 06/16/2018    Class:  Chronic   Osteochondral talar dome lesion 06/16/2018    Class: Chronic   Deep incisional surgical site infection    Superficial incisional surgical site infection 06/11/2018    Class: Acute   Right ankle effusion 06/11/2018    Class: Acute   Morbid (severe) obesity due to excess calories (HCC) 05/29/2018    Class: Chronic   Spondylolisthesis, lumbar region 05/27/2018    Class: Chronic   Spinal stenosis of lumbar region 05/27/2018    Class: Chronic   Urinary tract infection 05/27/2018    Class: Acute   Fusion of spine of lumbar region 05/27/2018   Pain of right hip joint 07/03/2017   Back pain 03/27/2017   Chronic right shoulder pain 02/13/2017   History of rotator cuff tear 11/30/2016   Impingement syndrome of right shoulder 11/30/2016   Exacerbation of asthma 07/18/2016   Acute hypokalemia 07/18/2016   Hypertension 07/18/2016   Depression with anxiety 07/18/2016   Hypothyroidism 07/18/2016   Hyperglycemia 07/18/2016   Asthma, chronic 07/18/2016   Surgical wound dehiscence left hip; questionable superficial infection 11/10/2015   Postoperative wound infection 11/10/2015   Osteoarthritis of left hip 09/09/2015   Status post total replacement of left hip 09/09/2015   Obesity (BMI 35.0-39.9 without comorbidity) 04/28/2013   Past Medical History:  Diagnosis Date   Anemia    taking iron now. pt states having no current issues 09/02/2015   Anginal pain (HCC)    pt states experiences chest wall pain pt states related to her asthma    Anxiety    with MRI's   Arthritis    Everywhere   Asthma    Depression    Dizziness    GERD (gastroesophageal reflux disease)    Headache(784.0)    HX OF MIGRAINES   History of bronchitis    Hypertension    Hypothyroidism    takes levothyroxen   OSA on CPAP    wears cpap   Shortness of breath    with exertion   Spinal cord stimulator status    Wears glasses     Family History  Problem Relation Age of Onset   Cancer Mother         colon   Epilepsy Mother    Cancer Father        prostate   Diabetes Father    Hypertension Father    Hypertension Maternal Aunt    Diabetes Maternal Aunt    Hypertension Paternal Aunt     Past Surgical History:  Procedure Laterality Date   APPLICATION OF WOUND VAC  11/16/2021   Procedure: APPLICATION OF WOUND VAC;  Surgeon: Cammy Copa, MD;  Location: [facility] OR;  Service: Orthopedics;;   APPLICATION OF WOUND VAC Left 12/01/2021   Procedure: APPLICATION OF WOUND VAC;  Surgeon: Cammy Copa, MD;  Location: [facility] OR;  Service: Orthopedics;  Laterality: Left;   BACK SURGERY     CESAREAN SECTION  times 2   CHOLECYSTECTOMY     COLONOSCOPY  2020   ENDOMETRIAL ABLATION     I & D EXTREMITY Left 12/06/2021   Procedure: LEFT SHOULDER DEBRIDEMENT AND WOUND CLOSURE;  Surgeon: [name], MD;  Location: [facility] OR;  Service: Orthopedics;  Laterality: Left;   I & D EXTREMITY Left 12/20/2021   Procedure: LEFT SHOULDER DEBRIDEMENT;  Surgeon: [name], MD;  Location: [facility] OR;  Service: Orthopedics;  Laterality: Left;   I & D EXTREMITY Left 12/22/2021   Procedure: LEFT SHOULDER DEBRIDEMENT;  Surgeon: [name], MD;  Location: [facility] OR;  Service: Orthopedics;  Laterality: Left;   INCISION AND DRAINAGE Left 12/01/2021   Procedure: LEFT SHOULDER IRRIGATION AND EXCISIONAL DEBRIDEMENT;  Surgeon: Cammy Copa, MD;  Location: [facility] OR;  Service: Orthopedics;  Laterality: Left;   INCISION AND DRAINAGE HIP Left 11/10/2015   Procedure: IRRIGATION AND DEBRIDEMENT LEFT HIP INCISION;  Surgeon: Kathryne Hitch, MD;  Location: [facility] OR;  Service: Orthopedics;  Laterality: Left;   JOINT REPLACEMENT  2011   total left knee   KNEE ARTHROPLASTY  04/23/2012   right    KNEE ARTHROSCOPY     LUMBAR LAMINECTOMY/DECOMPRESSION MICRODISCECTOMY N/A 06/23/2019   Procedure: RIGHT LUMBAR FIVE THROUGH SACRAL ONE PARTIAL HEMILAMINECTOMY WITH RIGHT LUMBAR FIVE FORAMINOTOMY;  Surgeon: Kerrin Champagne, MD;   Location: [facility] OR;  Service: Orthopedics;  Laterality: N/A;   LUMBAR WOUND DEBRIDEMENT N/A 06/11/2018   Procedure: LUMBAR WOUND DEBRIDEMENT;  Surgeon: Kerrin Champagne, MD;  Location: [facility] OR;  Service: Orthopedics;  Laterality: N/A;   RADIOLOGY WITH ANESTHESIA N/A 09/09/2018   Procedure: LUMBER SPINE WITHOUT CONTRAST;  Surgeon: Radiologist, Medication, MD;  Location: [facility] OR;  Service: Radiology;  Laterality: N/A;   REVERSE SHOULDER ARTHROPLASTY Left 11/16/2021   Procedure: LEFT REVERSE SHOULDER ARTHROPLASTY;  Surgeon: Cammy Copa, MD;  Location: [facility] OR;  Service: Orthopedics;  Laterality: Left;   REVERSE SHOULDER ARTHROPLASTY Right 11/14/2023   Procedure: RIGHT REVERSE SHOULDER ARTHROPLASTY;  Surgeon: Cammy Copa, MD;  Location: [facility] OR;  Service: Orthopedics;  Laterality: Right;   ROTATOR CUFF REPAIR     left    SHOULDER SURGERY     right to repair ligament tear   SPINAL CORD STIMULATOR IMPLANT  08/02/2020   [facility]   TOTAL HIP ARTHROPLASTY Left 09/09/2015   Procedure: LEFT TOTAL HIP ARTHROPLASTY ANTERIOR APPROACH;  Surgeon: Kathryne Hitch, MD;  Location: [facility] ORS;  Service: Orthopedics;  Laterality: Left;   TOTAL KNEE ARTHROPLASTY  04/23/2012   Procedure: TOTAL KNEE ARTHROPLASTY;  Surgeon: [name], MD;  Location: [facility] OR;  Service: Orthopedics;  Laterality: Right;  Right Total Knee Arthroplasty   TUBAL LIGATION  1996   Social History   Occupational History   Not on file  Tobacco Use   Smoking status: Former    Current packs/day: 0.00    Average packs/day: 0.5 packs/day for 4.0 years (2.0 ttl pk-yrs)    Types: Cigarettes    Start date: 09/18/1983    Quit date: 09/18/1987    Years since quitting: 36.2   Smokeless tobacco: Never  Vaping Use   Vaping status: Never Used  Substance and Sexual Activity   Alcohol use: No   Drug use: No   Sexual activity: Yes    Birth control/protection: Surgical

## 2023-12-01 ENCOUNTER — Encounter: Payer: Self-pay | Admitting: Surgical

## 2023-12-02 ENCOUNTER — Telehealth: Payer: Self-pay | Admitting: Orthopedic Surgery

## 2023-12-02 NOTE — Telephone Encounter (Signed)
IC verbal given.  

## 2023-12-02 NOTE — Telephone Encounter (Signed)
 [facility]. Verbal orders  1 week for 3 weeks home health - ot  Best call back [phone]

## 2023-12-04 ENCOUNTER — Telehealth: Payer: Self-pay | Admitting: Orthopedic Surgery

## 2023-12-04 NOTE — Telephone Encounter (Signed)
 I called and discussed with Marilyn [name].  She is coming back for reevaluation with Dr. August Saucer on Friday

## 2023-12-04 NOTE — Telephone Encounter (Signed)
 Marilyn [name] called. She is the home health nurse working with patient. Would like a call concerning patient's progression. [phone]

## 2023-12-06 ENCOUNTER — Ambulatory Visit: Admitting: Orthopedic Surgery

## 2023-12-06 ENCOUNTER — Encounter: Payer: Self-pay | Admitting: Orthopedic Surgery

## 2023-12-06 DIAGNOSIS — Z96611 Presence of right artificial shoulder joint: Secondary | ICD-10-CM

## 2023-12-06 MED ORDER — DOXYCYCLINE HYCLATE 100 MG PO TABS: 100.0000 mg | ORAL_TABLET | Freq: Two times a day (BID) | ORAL | 0 refills | Status: AC

## 2023-12-06 NOTE — Progress Notes (Signed)
 Post-Op Visit Note   Patient: Marilyn [name]           Date of Birth: [birth year]           MRN: [id number] Visit Date: 12/06/2023 PCP: Rebecka Apley, NP   Assessment & Plan:  Chief Complaint:  Chief Complaint  Patient presents with   Right Shoulder - Follow-up, Wound Check    right reverse shoulder arthroplasty on 11/14/2023   Visit Diagnoses:  1. S/P reverse total shoulder arthroplasty, right     Plan: Emberlee is a patient who is now about 4 weeks out from right reverse shoulder replacement.  On examination she has 1 small spot at the inferior aspect of the incision which probes to about 4 5 mm with scant serous drainage.  I cannot express any purulence.  Rest the incision is healed up nicely.  Shoulder moves reasonably well.  Plan at this time is to continue doxycycline with 1 week return.  May decide about debridement at that time.  Follow-Up Instructions: No follow-ups on file.   Orders:  No orders of the defined types were placed in this encounter.  No orders of the defined types were placed in this encounter.   Imaging: No results found.  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   Acute metabolic encephalopathy 03/31/2023   Lactic acidosis 12/18/2021   Dyspnea on exertion 12/18/2021   Anemia 12/18/2021   Gout 12/18/2021   Acute pain of left shoulder    Arthritis of left shoulder region    S/P reverse total shoulder arthroplasty, left 11/16/2021   Unilateral primary osteoarthritis, right hip 05/09/2021   S/P lumbar spinal fusion 05/09/2021   S/P insertion of spinal cord  stimulator 05/09/2021   H/O total knee replacement, bilateral 01/18/2021   Lumbar post-laminectomy syndrome 04/06/2020   Long-term current use of opiate analgesic 11/04/2019   Leg pain, right 10/20/2019   Dysesthesia 10/20/2019   Lateral femoral cutaneous neuropathy, 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 difficile colitis 05/22/2019   Hypophosphatemia 05/21/2019   Syncope 05/20/2019   OSA on CPAP    Acute gastroenteritis    Anemia due to blood loss, acute 06/18/2018    Class: Acute   Plantar fasciitis of left foot 06/16/2018    Class: Chronic   Tendonitis, Achilles, right 06/16/2018    Class: Chronic   Osteochondral talar dome lesion 06/16/2018    Class: Chronic   Deep incisional surgical site infection    Superficial incisional surgical site infection 06/11/2018    Class: Acute   Right ankle effusion 06/11/2018    Class: Acute   Morbid (severe) obesity due to excess calories (HCC) 05/29/2018    Class: Chronic   Spondylolisthesis, lumbar region 05/27/2018    Class: Chronic   Spinal stenosis of lumbar region 05/27/2018    Class: Chronic   Urinary tract infection 05/27/2018    Class: Acute   Fusion of spine of lumbar region 05/27/2018   Pain of right hip joint 07/03/2017   Back pain 03/27/2017   Chronic right  shoulder pain 02/13/2017   History of rotator cuff tear 11/30/2016   Impingement syndrome of right shoulder 11/30/2016   Exacerbation of asthma 07/18/2016   Acute hypokalemia 07/18/2016   Hypertension 07/18/2016   Depression with anxiety 07/18/2016   Hypothyroidism 07/18/2016   Hyperglycemia 07/18/2016   Asthma, chronic 07/18/2016   Surgical wound dehiscence left hip; questionable superficial infection 11/10/2015   Postoperative wound infection 11/10/2015   Osteoarthritis of left hip 09/09/2015   Status post total replacement of left hip 09/09/2015   Obesity (BMI 35.0-39.9 without  comorbidity) 04/28/2013   Past Medical History:  Diagnosis Date   Anemia    taking iron now. pt states having no current issues 09/02/2015   Anginal pain (HCC)    pt states experiences chest wall pain pt states related to her asthma    Anxiety    with MRI's   Arthritis    Everywhere   Asthma    Depression    Dizziness    GERD (gastroesophageal reflux disease)    Headache(784.0)    HX OF MIGRAINES   History of bronchitis    Hypertension    Hypothyroidism    takes levothyroxen   OSA on CPAP    wears cpap   Shortness of breath    with exertion   Spinal cord stimulator status    Wears glasses     Family History  Problem Relation Age of Onset   Cancer Mother        colon   Epilepsy Mother    Cancer Father        prostate   Diabetes Father    Hypertension Father    Hypertension Maternal Aunt    Diabetes Maternal Aunt    Hypertension Paternal Aunt     Past Surgical History:  Procedure Laterality Date   APPLICATION OF WOUND VAC  11/16/2021   Procedure: APPLICATION OF WOUND VAC;  Surgeon: Cammy Copa, MD;  Location: [facility] OR;  Service: Orthopedics;;   APPLICATION OF WOUND VAC Left 12/01/2021   Procedure: APPLICATION OF WOUND VAC;  Surgeon: Cammy Copa, MD;  Location: [facility] OR;  Service: Orthopedics;  Laterality: Left;   BACK SURGERY     CESAREAN SECTION     times 2   CHOLECYSTECTOMY     COLONOSCOPY  2020   ENDOMETRIAL ABLATION     I & D EXTREMITY Left 12/06/2021   Procedure: LEFT SHOULDER DEBRIDEMENT AND WOUND CLOSURE;  Surgeon: [name], MD;  Location: [facility] OR;  Service: Orthopedics;  Laterality: Left;   I & D EXTREMITY Left 12/20/2021   Procedure: LEFT SHOULDER DEBRIDEMENT;  Surgeon: [name], MD;  Location: [facility] OR;  Service: Orthopedics;  Laterality: Left;   I & D EXTREMITY Left 12/22/2021   Procedure: LEFT SHOULDER DEBRIDEMENT;  Surgeon: [name], MD;  Location: [facility] OR;  Service: Orthopedics;  Laterality: Left;   INCISION AND DRAINAGE  Left 12/01/2021   Procedure: LEFT SHOULDER IRRIGATION AND EXCISIONAL DEBRIDEMENT;  Surgeon: Cammy Copa, MD;  Location: [facility] OR;  Service: Orthopedics;  Laterality: Left;   INCISION AND DRAINAGE HIP Left 11/10/2015   Procedure: IRRIGATION AND DEBRIDEMENT LEFT HIP INCISION;  Surgeon: Kathryne Hitch, MD;  Location: [facility] OR;  Service: Orthopedics;  Laterality: Left;   JOINT REPLACEMENT  2011   total left knee   KNEE ARTHROPLASTY  04/23/2012   right    KNEE ARTHROSCOPY     LUMBAR LAMINECTOMY/DECOMPRESSION MICRODISCECTOMY N/A 06/23/2019   Procedure: RIGHT  LUMBAR FIVE THROUGH SACRAL ONE PARTIAL HEMILAMINECTOMY WITH RIGHT LUMBAR FIVE FORAMINOTOMY;  Surgeon: Kerrin Champagne, MD;  Location: [facility] OR;  Service: Orthopedics;  Laterality: N/A;   LUMBAR WOUND DEBRIDEMENT N/A 06/11/2018   Procedure: LUMBAR WOUND DEBRIDEMENT;  Surgeon: Kerrin Champagne, MD;  Location: [facility] OR;  Service: Orthopedics;  Laterality: N/A;   RADIOLOGY WITH ANESTHESIA N/A 09/09/2018   Procedure: LUMBER SPINE WITHOUT CONTRAST;  Surgeon: Radiologist, Medication, MD;  Location: [facility] OR;  Service: Radiology;  Laterality: N/A;   REVERSE SHOULDER ARTHROPLASTY Left 11/16/2021   Procedure: LEFT REVERSE SHOULDER ARTHROPLASTY;  Surgeon: Cammy Copa, MD;  Location: [facility] OR;  Service: Orthopedics;  Laterality: Left;   REVERSE SHOULDER ARTHROPLASTY Right 11/14/2023   Procedure: RIGHT REVERSE SHOULDER ARTHROPLASTY;  Surgeon: Cammy Copa, MD;  Location: [facility] OR;  Service: Orthopedics;  Laterality: Right;   ROTATOR CUFF REPAIR     left    SHOULDER SURGERY     right to repair ligament tear   SPINAL CORD STIMULATOR IMPLANT  08/02/2020   [facility]   TOTAL HIP ARTHROPLASTY Left 09/09/2015   Procedure: LEFT TOTAL HIP ARTHROPLASTY ANTERIOR APPROACH;  Surgeon: Kathryne Hitch, MD;  Location: [facility];  Service: Orthopedics;  Laterality: Left;   TOTAL KNEE ARTHROPLASTY  04/23/2012   Procedure: TOTAL KNEE ARTHROPLASTY;  Surgeon: [name], MD;  Location: [facility] OR;  Service: Orthopedics;  Laterality: Right;  Right Total Knee Arthroplasty   TUBAL LIGATION  1996   Social History   Occupational History   Not on file  Tobacco Use   Smoking status: Former    Current packs/day: 0.00    Average packs/day: 0.5 packs/day for 4.0 years (2.0 ttl pk-yrs)    Types: Cigarettes    Start date: 09/18/1983    Quit date: 09/18/1987    Years since quitting: 36.2   Smokeless tobacco: Never  Vaping Use   Vaping status: Never Used  Substance and Sexual Activity   Alcohol use: No   Drug use: No   Sexual activity: Yes    Birth control/protection: Surgical

## 2023-12-11 ENCOUNTER — Encounter: Admitting: Surgical

## 2023-12-11 ENCOUNTER — Ambulatory Visit (INDEPENDENT_AMBULATORY_CARE_PROVIDER_SITE_OTHER): Admitting: Surgical

## 2023-12-11 DIAGNOSIS — Z96611 Presence of right artificial shoulder joint: Secondary | ICD-10-CM

## 2023-12-13 ENCOUNTER — Ambulatory Visit (INDEPENDENT_AMBULATORY_CARE_PROVIDER_SITE_OTHER): Admitting: Surgical

## 2023-12-13 ENCOUNTER — Encounter: Payer: Self-pay | Admitting: Surgical

## 2023-12-13 DIAGNOSIS — Z96611 Presence of right artificial shoulder joint: Secondary | ICD-10-CM

## 2023-12-13 NOTE — Progress Notes (Signed)
 Post-Op Visit Note   Patient: Marilyn [name]           Date of Birth: 09-23-1970           MRN: [id number] Visit Date: 12/13/2023 PCP: Rebecka Apley, NP   Assessment & Plan:  Chief Complaint: No chief complaint on file.  Visit Diagnoses:  1. S/P reverse total shoulder arthroplasty, right     Plan: Patient is a 53 year old female who returns for evaluation of right shoulder incision following right reverse shoulder arthroplasty.  She is doing well in terms of her pain control.  No fevers or chills.  She has been using gauze and Hypafix dressing with packing material and since her last visit.  This was removed.  There is no expressible drainage compared with last visit.  No areas of fluctuance.  No cellulitis.  Patient does continue to take doxycycline.  The small opening was probed to about 1.5 cm depth.  The incision was repacked and patient will follow-up on Monday for clinical recheck and reprobing to see if the depth is improving.  May need to consider localized incision and irrigation/debridement with reclosure in the operating room based on her next visit.  Follow-Up Instructions: No follow-ups on file.   Orders:  No orders of the defined types were placed in this encounter.  No orders of the defined types were placed in this encounter.   Imaging: No results found.  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   Acute metabolic encephalopathy 03/31/2023   Lactic acidosis 12/18/2021   Dyspnea on exertion 12/18/2021   Anemia 12/18/2021   Gout 12/18/2021   Acute pain of left shoulder     Arthritis of left shoulder region    S/P reverse total shoulder arthroplasty, left 11/16/2021   Unilateral primary osteoarthritis, right hip 05/09/2021   S/P lumbar spinal fusion 05/09/2021   S/P insertion of spinal cord stimulator 05/09/2021   H/O total knee replacement, bilateral 01/18/2021   Lumbar post-laminectomy syndrome 04/06/2020   Long-term current use of opiate analgesic 11/04/2019   Leg pain, right 10/20/2019   Dysesthesia 10/20/2019   Lateral femoral cutaneous neuropathy, 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 difficile colitis 05/22/2019   Hypophosphatemia 05/21/2019   Syncope 05/20/2019   OSA on CPAP    Acute gastroenteritis    Anemia due to blood loss, acute 06/18/2018    Class: Acute   Plantar fasciitis of left foot 06/16/2018    Class: Chronic   Tendonitis, Achilles, right 06/16/2018    Class: Chronic   Osteochondral talar dome lesion 06/16/2018    Class: Chronic   Deep incisional surgical site infection    Superficial incisional surgical site infection 06/11/2018    Class: Acute   Right ankle effusion 06/11/2018    Class: Acute   Morbid (severe) obesity due to excess calories (HCC) 05/29/2018    Class: Chronic   Spondylolisthesis, lumbar region 05/27/2018    Class: Chronic   Spinal stenosis of lumbar region 05/27/2018    Class: Chronic   Urinary tract infection  05/27/2018    Class: Acute   Fusion of spine of lumbar region 05/27/2018   Pain of right hip joint 07/03/2017   Back pain 03/27/2017   Chronic right shoulder pain 02/13/2017   History of rotator cuff tear 11/30/2016   Impingement syndrome of right shoulder 11/30/2016   Exacerbation of asthma 07/18/2016   Acute hypokalemia 07/18/2016   Hypertension 07/18/2016   Depression with anxiety 07/18/2016   Hypothyroidism 07/18/2016   Hyperglycemia 07/18/2016   Asthma, chronic 07/18/2016   Surgical wound dehiscence left  hip; questionable superficial infection 11/10/2015   Postoperative wound infection 11/10/2015   Osteoarthritis of left hip 09/09/2015   Status post total replacement of left hip 09/09/2015   Obesity (BMI 35.0-39.9 without comorbidity) 04/28/2013   Past Medical History:  Diagnosis Date   Anemia    taking iron now. pt states having no current issues 09/02/2015   Anginal pain (HCC)    pt states experiences chest wall pain pt states related to her asthma    Anxiety    with MRI's   Arthritis    Everywhere   Asthma    Depression    Dizziness    GERD (gastroesophageal reflux disease)    Headache(784.0)    HX OF MIGRAINES   History of bronchitis    Hypertension    Hypothyroidism    takes levothyroxen   OSA on CPAP    wears cpap   Shortness of breath    with exertion   Spinal cord stimulator status    Wears glasses     Family History  Problem Relation Age of Onset   Cancer Mother        colon   Epilepsy Mother    Cancer Father        prostate   Diabetes Father    Hypertension Father    Hypertension Maternal Aunt    Diabetes Maternal Aunt    Hypertension Paternal Aunt     Past Surgical History:  Procedure Laterality Date   APPLICATION OF WOUND VAC  11/16/2021   Procedure: APPLICATION OF WOUND VAC;  Surgeon: Cammy Copa, MD;  Location: [facility] OR;  Service: Orthopedics;;   APPLICATION OF WOUND VAC Left 12/01/2021   Procedure: APPLICATION OF WOUND VAC;  Surgeon: Cammy Copa, MD;  Location: [facility] OR;  Service: Orthopedics;  Laterality: Left;   BACK SURGERY     CESAREAN SECTION     times 2   CHOLECYSTECTOMY     COLONOSCOPY  2020   ENDOMETRIAL ABLATION     I & D EXTREMITY Left 12/06/2021   Procedure: LEFT SHOULDER DEBRIDEMENT AND WOUND CLOSURE;  Surgeon: [name], MD;  Location: [facility] OR;  Service: Orthopedics;  Laterality: Left;   I & D EXTREMITY Left 12/20/2021   Procedure: LEFT SHOULDER DEBRIDEMENT;  Surgeon: [name], MD;  Location: [facility] OR;  Service:  Orthopedics;  Laterality: Left;   I & D EXTREMITY Left 12/22/2021   Procedure: LEFT SHOULDER DEBRIDEMENT;  Surgeon: [name], MD;  Location: [facility] OR;  Service: Orthopedics;  Laterality: Left;   INCISION AND DRAINAGE Left 12/01/2021   Procedure: LEFT SHOULDER IRRIGATION AND EXCISIONAL DEBRIDEMENT;  Surgeon: Cammy Copa, MD;  Location: [facility] OR;  Service: Orthopedics;  Laterality: Left;   INCISION AND DRAINAGE HIP Left 11/10/2015   Procedure: IRRIGATION AND DEBRIDEMENT LEFT HIP INCISION;  Surgeon: Kathryne Hitch, MD;  Location: [facility] OR;  Service: Orthopedics;  Laterality: Left;   JOINT REPLACEMENT  2011  total left knee   KNEE ARTHROPLASTY  04/23/2012   right    KNEE ARTHROSCOPY     LUMBAR LAMINECTOMY/DECOMPRESSION MICRODISCECTOMY N/A 06/23/2019   Procedure: RIGHT LUMBAR FIVE THROUGH SACRAL ONE PARTIAL HEMILAMINECTOMY WITH RIGHT LUMBAR FIVE FORAMINOTOMY;  Surgeon: Kerrin Champagne, MD;  Location: [facility] OR;  Service: Orthopedics;  Laterality: N/A;   LUMBAR WOUND DEBRIDEMENT N/A 06/11/2018   Procedure: LUMBAR WOUND DEBRIDEMENT;  Surgeon: Kerrin Champagne, MD;  Location: [facility] OR;  Service: Orthopedics;  Laterality: N/A;   RADIOLOGY WITH ANESTHESIA N/A 09/09/2018   Procedure: LUMBER SPINE WITHOUT CONTRAST;  Surgeon: Radiologist, Medication, MD;  Location: [facility] OR;  Service: Radiology;  Laterality: N/A;   REVERSE SHOULDER ARTHROPLASTY Left 11/16/2021   Procedure: LEFT REVERSE SHOULDER ARTHROPLASTY;  Surgeon: Cammy Copa, MD;  Location: [facility] OR;  Service: Orthopedics;  Laterality: Left;   REVERSE SHOULDER ARTHROPLASTY Right 11/14/2023   Procedure: RIGHT REVERSE SHOULDER ARTHROPLASTY;  Surgeon: Cammy Copa, MD;  Location: [facility] OR;  Service: Orthopedics;  Laterality: Right;   ROTATOR CUFF REPAIR     left    SHOULDER SURGERY     right to repair ligament tear   SPINAL CORD STIMULATOR IMPLANT  08/02/2020   [facility]   TOTAL HIP ARTHROPLASTY Left 09/09/2015   Procedure: LEFT TOTAL HIP  ARTHROPLASTY ANTERIOR APPROACH;  Surgeon: Kathryne Hitch, MD;  Location: [facility] ORS;  Service: Orthopedics;  Laterality: Left;   TOTAL KNEE ARTHROPLASTY  04/23/2012   Procedure: TOTAL KNEE ARTHROPLASTY;  Surgeon: [name], MD;  Location: [facility] OR;  Service: Orthopedics;  Laterality: Right;  Right Total Knee Arthroplasty   TUBAL LIGATION  1996   Social History   Occupational History   Not on file  Tobacco Use   Smoking status: Former    Current packs/day: 0.00    Average packs/day: 0.5 packs/day for 4.0 years (2.0 ttl pk-yrs)    Types: Cigarettes    Start date: 09/18/1983    Quit date: 09/18/1987    Years since quitting: 36.2   Smokeless tobacco: Never  Vaping Use   Vaping status: Never Used  Substance and Sexual Activity   Alcohol use: No   Drug use: No   Sexual activity: Yes    Birth control/protection: Surgical

## 2023-12-15 ENCOUNTER — Encounter: Payer: Self-pay | Admitting: Surgical

## 2023-12-15 NOTE — Progress Notes (Signed)
 Post-Op Visit Note   Patient: Marilyn [name]           Date of Birth: January 08, 1971           MRN: [id number] Visit Date: 12/11/2023 PCP: Rebecka Apley, NP   Assessment & Plan:  Chief Complaint:  Chief Complaint  Patient presents with   Right Shoulder - Follow-up    right reverse shoulder arthroplasty on 11/14/2023   Visit Diagnoses:  1. S/P reverse total shoulder arthroplasty, right     Plan: Ayaana is a 53 year old female who returns for incision recheck for her right shoulder following right reverse shoulder arthroplasty on 11/14/2023.  The proximal portion of the incision continues to look well-healed and there is a little bit of drainage from the continued presence of the sinus tract distally.  There is no fluctuance or erythema.  She is still taking antibiotics.  She still has a refill left for the antibiotics.  On probing the incision, it probes to a depth of about 1.5 to 2 cm.  We discussed options available to [facility].  She would like to avoid irrigation and debridement like she had with her contralateral shoulder replacement.  We will try packing the area while she remains on antibiotics and see if the depth will improve over the coming week.  If it improves, we can avoid surgical intervention but if this fails to significantly improve and she has continued drainage with no decrease in the wound bed depth, we will need to consider surgical debridement and reclosure likely using Kerecis graft and wound VAC.  The wound was packed with iodoform gauze with patient tolerating procedure well.  Follow-up in 2 days.  Follow-Up Instructions: No follow-ups on file.   Orders:  No orders of the defined types were placed in this encounter.  No orders of the defined types were placed in this encounter.   Imaging: No results found.  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   Acute metabolic encephalopathy 03/31/2023   Lactic acidosis 12/18/2021   Dyspnea on exertion 12/18/2021   Anemia 12/18/2021   Gout 12/18/2021   Acute pain of left shoulder    Arthritis of left shoulder region    S/P reverse total shoulder arthroplasty, left 11/16/2021   Unilateral primary osteoarthritis, right hip 05/09/2021   S/P lumbar spinal fusion 05/09/2021   S/P insertion of spinal cord stimulator 05/09/2021   H/O total knee replacement, bilateral 01/18/2021   Lumbar post-laminectomy syndrome 04/06/2020   Long-term current use of opiate analgesic 11/04/2019   Leg pain, right 10/20/2019   Dysesthesia 10/20/2019   Lateral femoral cutaneous neuropathy, 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 difficile colitis 05/22/2019   Hypophosphatemia 05/21/2019   Syncope 05/20/2019   OSA on CPAP    Acute gastroenteritis    Anemia due to blood loss, acute 06/18/2018    Class: Acute   Plantar fasciitis of left foot 06/16/2018    Class: Chronic   Tendonitis, Achilles, right 06/16/2018    Class: Chronic   Osteochondral talar dome lesion 06/16/2018  Class: Chronic   Deep incisional surgical site infection    Superficial incisional surgical site infection 06/11/2018    Class: Acute   Right ankle effusion 06/11/2018    Class: Acute   Morbid (severe) obesity due to excess calories (HCC) 05/29/2018    Class: Chronic   Spondylolisthesis, lumbar region 05/27/2018    Class: Chronic   Spinal stenosis of lumbar region 05/27/2018    Class: Chronic   Urinary tract infection 05/27/2018    Class: Acute   Fusion of spine of lumbar  region 05/27/2018   Pain of right hip joint 07/03/2017   Back pain 03/27/2017   Chronic right shoulder pain 02/13/2017   History of rotator cuff tear 11/30/2016   Impingement syndrome of right shoulder 11/30/2016   Exacerbation of asthma 07/18/2016   Acute hypokalemia 07/18/2016   Hypertension 07/18/2016   Depression with anxiety 07/18/2016   Hypothyroidism 07/18/2016   Hyperglycemia 07/18/2016   Asthma, chronic 07/18/2016   Surgical wound dehiscence left hip; questionable superficial infection 11/10/2015   Postoperative wound infection 11/10/2015   Osteoarthritis of left hip 09/09/2015   Status post total replacement of left hip 09/09/2015   Obesity (BMI 35.0-39.9 without comorbidity) 04/28/2013   Past Medical History:  Diagnosis Date   Anemia    taking iron now. pt states having no current issues 09/02/2015   Anginal pain (HCC)    pt states experiences chest wall pain pt states related to her asthma    Anxiety    with MRI's   Arthritis    Everywhere   Asthma    Depression    Dizziness    GERD (gastroesophageal reflux disease)    Headache(784.0)    HX OF MIGRAINES   History of bronchitis    Hypertension    Hypothyroidism    takes levothyroxen   OSA on CPAP    wears cpap   Shortness of breath    with exertion   Spinal cord stimulator status    Wears glasses     Family History  Problem Relation Age of Onset   Cancer Mother        colon   Epilepsy Mother    Cancer Father        prostate   Diabetes Father    Hypertension Father    Hypertension Maternal Aunt    Diabetes Maternal Aunt    Hypertension Paternal Aunt     Past Surgical History:  Procedure Laterality Date   APPLICATION OF WOUND VAC  11/16/2021   Procedure: APPLICATION OF WOUND VAC;  Surgeon: Cammy Copa, MD;  Location: [facility] OR;  Service: Orthopedics;;   APPLICATION OF WOUND VAC Left 12/01/2021   Procedure: APPLICATION OF WOUND VAC;  Surgeon: Cammy Copa, MD;  Location: [facility] OR;   Service: Orthopedics;  Laterality: Left;   BACK SURGERY     CESAREAN SECTION     times 2   CHOLECYSTECTOMY     COLONOSCOPY  2020   ENDOMETRIAL ABLATION     I & D EXTREMITY Left 12/06/2021   Procedure: LEFT SHOULDER DEBRIDEMENT AND WOUND CLOSURE;  Surgeon: [name], MD;  Location: [facility] OR;  Service: Orthopedics;  Laterality: Left;   I & D EXTREMITY Left 12/20/2021   Procedure: LEFT SHOULDER DEBRIDEMENT;  Surgeon: [name], MD;  Location: [facility] OR;  Service: Orthopedics;  Laterality: Left;   I & D EXTREMITY Left 12/22/2021   Procedure: LEFT SHOULDER DEBRIDEMENT;  Surgeon: [name], MD;  Location:  [facility] OR;  Service: Orthopedics;  Laterality: Left;   INCISION AND DRAINAGE Left 12/01/2021   Procedure: LEFT SHOULDER IRRIGATION AND EXCISIONAL DEBRIDEMENT;  Surgeon: Cammy Copa, MD;  Location: [facility] OR;  Service: Orthopedics;  Laterality: Left;   INCISION AND DRAINAGE HIP Left 11/10/2015   Procedure: IRRIGATION AND DEBRIDEMENT LEFT HIP INCISION;  Surgeon: Kathryne Hitch, MD;  Location: [facility] OR;  Service: Orthopedics;  Laterality: Left;   JOINT REPLACEMENT  2011   total left knee   KNEE ARTHROPLASTY  04/23/2012   right    KNEE ARTHROSCOPY     LUMBAR LAMINECTOMY/DECOMPRESSION MICRODISCECTOMY N/A 06/23/2019   Procedure: RIGHT LUMBAR FIVE THROUGH SACRAL ONE PARTIAL HEMILAMINECTOMY WITH RIGHT LUMBAR FIVE FORAMINOTOMY;  Surgeon: Kerrin Champagne, MD;  Location: [facility] OR;  Service: Orthopedics;  Laterality: N/A;   LUMBAR WOUND DEBRIDEMENT N/A 06/11/2018   Procedure: LUMBAR WOUND DEBRIDEMENT;  Surgeon: Kerrin Champagne, MD;  Location: [facility] OR;  Service: Orthopedics;  Laterality: N/A;   RADIOLOGY WITH ANESTHESIA N/A 09/09/2018   Procedure: LUMBER SPINE WITHOUT CONTRAST;  Surgeon: Radiologist, Medication, MD;  Location: [facility] OR;  Service: Radiology;  Laterality: N/A;   REVERSE SHOULDER ARTHROPLASTY Left 11/16/2021   Procedure: LEFT REVERSE SHOULDER ARTHROPLASTY;  Surgeon: Cammy Copa,  MD;  Location: [facility] OR;  Service: Orthopedics;  Laterality: Left;   REVERSE SHOULDER ARTHROPLASTY Right 11/14/2023   Procedure: RIGHT REVERSE SHOULDER ARTHROPLASTY;  Surgeon: Cammy Copa, MD;  Location: [facility] OR;  Service: Orthopedics;  Laterality: Right;   ROTATOR CUFF REPAIR     left    SHOULDER SURGERY     right to repair ligament tear   SPINAL CORD STIMULATOR IMPLANT  08/02/2020   [facility]   TOTAL HIP ARTHROPLASTY Left 09/09/2015   Procedure: LEFT TOTAL HIP ARTHROPLASTY ANTERIOR APPROACH;  Surgeon: Kathryne Hitch, MD;  Location: [facility] OR[facility];  Service: Orthopedics;  Laterality: Left;   TOTAL KNEE ARTHROPLASTY  04/23/2012   Procedure: TOTAL KNEE ARTHROPLASTY;  Surgeon: [name], MD;  Location: [facility] OR;  Service: Orthopedics;  Laterality: Right;  Right Total Knee Arthroplasty   TUBAL LIGATION  1996   Social History   Occupational History   Not on file  Tobacco Use   Smoking status: Former    Current packs/day: 0.00    Average packs/day: 0.5 packs/day for 4.0 years (2.0 ttl pk-yrs)    Types: Cigarettes    Start date: 09/18/1983    Quit date: 09/18/1987    Years since quitting: 36.2   Smokeless tobacco: Never  Vaping Use   Vaping status: Never Used  Substance and Sexual Activity   Alcohol use: No   Drug use: No   Sexual activity: Yes    Birth control/protection: Surgical

## 2023-12-16 ENCOUNTER — Ambulatory Visit (INDEPENDENT_AMBULATORY_CARE_PROVIDER_SITE_OTHER): Admitting: Surgical

## 2023-12-16 ENCOUNTER — Encounter: Payer: Self-pay | Admitting: Surgical

## 2023-12-16 DIAGNOSIS — Z96611 Presence of right artificial shoulder joint: Secondary | ICD-10-CM

## 2023-12-16 NOTE — Progress Notes (Signed)
 Post-Op Visit Note   Patient: Marilyn [name]           Date of Birth: 09-16-1971           MRN: [id number] Visit Date: 12/16/2023 PCP: Rebecka Apley, NP   Assessment & Plan:  Chief Complaint:  Chief Complaint  Patient presents with   Right Shoulder - Routine Post Op, Wound Check    11/14/2023 Right RSA   Visit Diagnoses:  1. S/P reverse total shoulder arthroplasty, right     Plan: Patient is a 53 year old female who returns for reevaluation of right reverse shoulder arthroplasty incision from surgery performed on 11/14/2023.  Doing okay.  Feels the shoulder is continuing to be painful with a little bit of increased pain in the anterior aspect of the shoulder away from the incision opening.  Has been taking doxycycline twice daily.  Packing removed and there is some continued serosanguineous drainage that is expressible.  No fluctuance noted.  The incision was probed with sterile Q-tip and this demonstrated about 2 cm wound depth  Patient was evaluated by Dr. Lajoyce Corners today who recommended daily Vashe dressing changes and continuing antibiotics.  Follow-up in 1 week for clinical recheck with Dr. Lajoyce Corners for further evaluation of incision.  Follow-Up Instructions: No follow-ups on file.   Orders:  No orders of the defined types were placed in this encounter.  No orders of the defined types were placed in this encounter.   Imaging: No results found.  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   Acute metabolic encephalopathy 03/31/2023   Lactic acidosis 12/18/2021   Dyspnea on exertion 12/18/2021    Anemia 12/18/2021   Gout 12/18/2021   Acute pain of left shoulder    Arthritis of left shoulder region    S/P reverse total shoulder arthroplasty, left 11/16/2021   Unilateral primary osteoarthritis, right hip 05/09/2021   S/P lumbar spinal fusion 05/09/2021   S/P insertion of spinal cord stimulator 05/09/2021   H/O total knee replacement, bilateral 01/18/2021   Lumbar post-laminectomy syndrome 04/06/2020   Long-term current use of opiate analgesic 11/04/2019   Leg pain, right 10/20/2019   Dysesthesia 10/20/2019   Lateral femoral cutaneous neuropathy, 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 difficile colitis 05/22/2019   Hypophosphatemia 05/21/2019   Syncope 05/20/2019   OSA on CPAP    Acute gastroenteritis    Anemia due to blood loss, acute 06/18/2018    Class: Acute   Plantar fasciitis of left foot 06/16/2018    Class: Chronic   Tendonitis, Achilles, right 06/16/2018    Class: Chronic   Osteochondral talar dome lesion 06/16/2018    Class: Chronic   Deep incisional surgical site infection    Superficial incisional surgical site infection 06/11/2018    Class: Acute   Right ankle effusion 06/11/2018    Class: Acute   Morbid (severe) obesity due to excess calories (HCC) 05/29/2018    Class: Chronic   Spondylolisthesis, lumbar region 05/27/2018    Class: Chronic   Spinal stenosis of lumbar region 05/27/2018  Class: Chronic   Urinary tract infection 05/27/2018    Class: Acute   Fusion of spine of lumbar region 05/27/2018   Pain of right hip joint 07/03/2017   Back pain 03/27/2017   Chronic right shoulder pain 02/13/2017   History of rotator cuff tear 11/30/2016   Impingement syndrome of right shoulder 11/30/2016   Exacerbation of asthma 07/18/2016   Acute hypokalemia 07/18/2016   Hypertension 07/18/2016   Depression with anxiety 07/18/2016   Hypothyroidism 07/18/2016   Hyperglycemia  07/18/2016   Asthma, chronic 07/18/2016   Surgical wound dehiscence left hip; questionable superficial infection 11/10/2015   Postoperative wound infection 11/10/2015   Osteoarthritis of left hip 09/09/2015   Status post total replacement of left hip 09/09/2015   Obesity (BMI 35.0-39.9 without comorbidity) 04/28/2013   Past Medical History:  Diagnosis Date   Anemia    taking iron now. pt states having no current issues 09/02/2015   Anginal pain (HCC)    pt states experiences chest wall pain pt states related to her asthma    Anxiety    with MRI's   Arthritis    Everywhere   Asthma    Depression    Dizziness    GERD (gastroesophageal reflux disease)    Headache(784.0)    HX OF MIGRAINES   History of bronchitis    Hypertension    Hypothyroidism    takes levothyroxen   OSA on CPAP    wears cpap   Shortness of breath    with exertion   Spinal cord stimulator status    Wears glasses     Family History  Problem Relation Age of Onset   Cancer Mother        colon   Epilepsy Mother    Cancer Father        prostate   Diabetes Father    Hypertension Father    Hypertension Maternal Aunt    Diabetes Maternal Aunt    Hypertension Paternal Aunt     Past Surgical History:  Procedure Laterality Date   APPLICATION OF WOUND VAC  11/16/2021   Procedure: APPLICATION OF WOUND VAC;  Surgeon: Cammy Copa, MD;  Location: [facility] OR;  Service: Orthopedics;;   APPLICATION OF WOUND VAC Left 12/01/2021   Procedure: APPLICATION OF WOUND VAC;  Surgeon: Cammy Copa, MD;  Location: [facility] OR;  Service: Orthopedics;  Laterality: Left;   BACK SURGERY     CESAREAN SECTION     times 2   CHOLECYSTECTOMY     COLONOSCOPY  2020   ENDOMETRIAL ABLATION     I & D EXTREMITY Left 12/06/2021   Procedure: LEFT SHOULDER DEBRIDEMENT AND WOUND CLOSURE;  Surgeon: [name], MD;  Location: [facility] OR;  Service: Orthopedics;  Laterality: Left;   I & D EXTREMITY Left 12/20/2021   Procedure: LEFT  SHOULDER DEBRIDEMENT;  Surgeon: [name], MD;  Location: [facility] OR;  Service: Orthopedics;  Laterality: Left;   I & D EXTREMITY Left 12/22/2021   Procedure: LEFT SHOULDER DEBRIDEMENT;  Surgeon: [name], MD;  Location: [facility] OR;  Service: Orthopedics;  Laterality: Left;   INCISION AND DRAINAGE Left 12/01/2021   Procedure: LEFT SHOULDER IRRIGATION AND EXCISIONAL DEBRIDEMENT;  Surgeon: Cammy Copa, MD;  Location: [facility] OR;  Service: Orthopedics;  Laterality: Left;   INCISION AND DRAINAGE HIP Left 11/10/2015   Procedure: IRRIGATION AND DEBRIDEMENT LEFT HIP INCISION;  Surgeon: Kathryne Hitch, MD;  Location: [facility] OR;  Service: Orthopedics;  Laterality:  Left;   JOINT REPLACEMENT  2011   total left knee   KNEE ARTHROPLASTY  04/23/2012   right    KNEE ARTHROSCOPY     LUMBAR LAMINECTOMY/DECOMPRESSION MICRODISCECTOMY N/A 06/23/2019   Procedure: RIGHT LUMBAR FIVE THROUGH SACRAL ONE PARTIAL HEMILAMINECTOMY WITH RIGHT LUMBAR FIVE FORAMINOTOMY;  Surgeon: Kerrin Champagne, MD;  Location: [facility] OR;  Service: Orthopedics;  Laterality: N/A;   LUMBAR WOUND DEBRIDEMENT N/A 06/11/2018   Procedure: LUMBAR WOUND DEBRIDEMENT;  Surgeon: Kerrin Champagne, MD;  Location: [facility] OR;  Service: Orthopedics;  Laterality: N/A;   RADIOLOGY WITH ANESTHESIA N/A 09/09/2018   Procedure: LUMBER SPINE WITHOUT CONTRAST;  Surgeon: Radiologist, Medication, MD;  Location: [facility] OR;  Service: Radiology;  Laterality: N/A;   REVERSE SHOULDER ARTHROPLASTY Left 11/16/2021   Procedure: LEFT REVERSE SHOULDER ARTHROPLASTY;  Surgeon: Cammy Copa, MD;  Location: [facility] OR;  Service: Orthopedics;  Laterality: Left;   REVERSE SHOULDER ARTHROPLASTY Right 11/14/2023   Procedure: RIGHT REVERSE SHOULDER ARTHROPLASTY;  Surgeon: Cammy Copa, MD;  Location: [facility] OR;  Service: Orthopedics;  Laterality: Right;   ROTATOR CUFF REPAIR     left    SHOULDER SURGERY     right to repair ligament tear   SPINAL CORD STIMULATOR IMPLANT  08/02/2020    [facility]   TOTAL HIP ARTHROPLASTY Left 09/09/2015   Procedure: LEFT TOTAL HIP ARTHROPLASTY ANTERIOR APPROACH;  Surgeon: Kathryne Hitch, MD;  Location: [facility] ORS;  Service: Orthopedics;  Laterality: Left;   TOTAL KNEE ARTHROPLASTY  04/23/2012   Procedure: TOTAL KNEE ARTHROPLASTY;  Surgeon: [name], MD;  Location: [facility] OR;  Service: Orthopedics;  Laterality: Right;  Right Total Knee Arthroplasty   TUBAL LIGATION  1996   Social History   Occupational History   Not on file  Tobacco Use   Smoking status: Former    Current packs/day: 0.00    Average packs/day: 0.5 packs/day for 4.0 years (2.0 ttl pk-yrs)    Types: Cigarettes    Start date: 09/18/1983    Quit date: 09/18/1987    Years since quitting: 36.2   Smokeless tobacco: Never  Vaping Use   Vaping status: Never Used  Substance and Sexual Activity   Alcohol use: No   Drug use: No   Sexual activity: Yes    Birth control/protection: Surgical

## 2023-12-17 ENCOUNTER — Telehealth: Payer: Self-pay | Admitting: Orthopedic Surgery

## 2023-12-17 NOTE — Telephone Encounter (Signed)
 Malori with [facility] request a call back to determine if Dr August Saucer would like to discontinue PT service for now      Malori's call back number [phone]

## 2023-12-17 NOTE — Telephone Encounter (Signed)
 Sending back as FYI IC advised to d/c until seeing Dr Lajoyce Corners next week. They did say if you wanted additional HHPT it will have to be reordered.

## 2023-12-17 NOTE — Telephone Encounter (Signed)
 Yes dc for now til sees duda next week thx

## 2023-12-18 ENCOUNTER — Encounter: Admitting: Surgical

## 2023-12-23 ENCOUNTER — Encounter: Payer: Self-pay | Admitting: Orthopedic Surgery

## 2023-12-23 ENCOUNTER — Ambulatory Visit (INDEPENDENT_AMBULATORY_CARE_PROVIDER_SITE_OTHER): Admitting: Orthopedic Surgery

## 2023-12-23 DIAGNOSIS — G8929 Other chronic pain: Secondary | ICD-10-CM | POA: Diagnosis not present

## 2023-12-23 DIAGNOSIS — M25511 Pain in right shoulder: Secondary | ICD-10-CM | POA: Diagnosis not present

## 2023-12-23 DIAGNOSIS — Z96611 Presence of right artificial shoulder joint: Secondary | ICD-10-CM

## 2023-12-23 DIAGNOSIS — S41001A Unspecified open wound of right shoulder, initial encounter: Secondary | ICD-10-CM

## 2023-12-23 NOTE — Progress Notes (Signed)
 Office Visit Note   Patient: Marilyn [name]           Date of Birth: Jul 11, 1971           MRN: [id number] Visit Date: 12/23/2023              Requested by: Rebecka Apley, NP [address] [place],  [place] [address] PCP: Rebecka Apley, NP  Chief Complaint  Patient presents with   Right Shoulder - Wound Check      HPI: Patient is a 53 year old woman who is seen status post reverse right total shoulder arthroplasty.  Patient has had wound dehiscence distally.  She was started on Vashe dressing changes and doxycycline last week.  Assessment & Plan: Visit Diagnoses:  1. Chronic right shoulder pain   2. S/P reverse total shoulder arthroplasty, right   3. Wound of right shoulder, initial encounter     Plan: Continue current wound care.  Deep cultures were obtained.  Will adjust antibiotics as needed.  Discussed that if were not showing improvement in 2 weeks we may need to proceed with surgical debridement and tissue grafting and wound closure.  Follow-Up Instructions: Return in about 2 weeks (around 01/06/2024).   Ortho Exam  Patient is alert, oriented, no adenopathy, well-dressed, normal affect, normal respiratory effort. Examination patient has a small pinpoint wound over the distal incision.  There is no redness or cellulitis in the skin.  There is a small amount of serosanguineous drainage.  The ulcer is probed with a Q-tip and this was sent for cultures.  Patient is currently on doxycycline.  The wound is 3 mm in diameter.  Imaging: No results found.   Labs: Lab Results  Component Value Date   HGBA1C 6.2 (H) 07/18/2016   HGBA1C 6.2 (H) 09/02/2015   ESRSEDRATE 106 (H) 12/18/2021   ESRSEDRATE 70 (H) 12/04/2021   ESRSEDRATE 56 (H) 01/08/2019   CRP 5.2 (H) 12/17/2021   CRP 1.8 (H) 12/08/2021   CRP 2.5 (H) 12/04/2021   LABURIC 8.6 (H) 12/18/2021   LABURIC 5.9 01/08/2019   LABURIC 8.7 (H) 06/16/2018   REPTSTATUS 04/11/2023 FINAL  04/07/2023   GRAMSTAIN NO WBC SEEN NO ORGANISMS SEEN  12/20/2021   CULT (A) 04/07/2023    80,000 COLONIES/mL ENTEROCOCCUS FAECALIS 20,000 COLONIES/mL ENTEROCOCCUS AVIUM    LABORGA ENTEROCOCCUS FAECALIS (A) 04/07/2023   LABORGA ENTEROCOCCUS AVIUM (A) 04/07/2023     Lab Results  Component Value Date   ALBUMIN 3.0 (L) 10/14/2023   ALBUMIN 2.8 (L) 10/13/2023   ALBUMIN 3.6 10/12/2023    Lab Results  Component Value Date   MG 1.9 10/14/2023   MG 2.2 10/12/2023   MG 1.8 03/31/2023   Lab Results  Component Value Date   VD25OH 44 10/30/2023    No results found for: "PREALBUMIN"    Latest Ref Rng & Units 11/08/2023   10:38 AM 10/13/2023    7:29 AM 10/12/2023   12:44 PM  CBC EXTENDED  WBC 4.0 - 10.5 K/uL 9.6  7.0  8.7   RBC 3.87 - 5.11 MIL/uL 4.15  3.56  3.57   Hemoglobin 12.0 - 15.0 g/dL 84.6  96.2  95.2   HCT 36.0 - 46.0 % 37.8  31.6  32.6   Platelets 150 - 400 K/uL 264  212  200   NEUT# 1.7 - 7.7 K/uL   6.3   Lymph# 0.7 - 4.0 K/uL   1.3      There is  no height or weight on file to calculate BMI.  Orders:  Orders Placed This Encounter  Procedures   Wound culture   No orders of the defined types were placed in this encounter.    Procedures: No procedures performed  Clinical Data: No additional findings.  ROS:  All other systems negative, except as noted in the HPI. Review of Systems  Objective: Vital Signs: LMP  (LMP Unknown) Comment: tubal ligation  Specialty Comments:  No specialty comments available.  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   Acute metabolic  encephalopathy 03/31/2023   Lactic acidosis 12/18/2021   Dyspnea on exertion 12/18/2021   Anemia 12/18/2021   Gout 12/18/2021   Acute pain of left shoulder    Arthritis of left shoulder region    S/P reverse total shoulder arthroplasty, left 11/16/2021   Unilateral primary osteoarthritis, right hip 05/09/2021   S/P lumbar spinal fusion 05/09/2021   S/P insertion of spinal cord stimulator 05/09/2021   H/O total knee replacement, bilateral 01/18/2021   Lumbar post-laminectomy syndrome 04/06/2020   Long-term current use of opiate analgesic 11/04/2019   Leg pain, right 10/20/2019   Dysesthesia 10/20/2019   Lateral femoral cutaneous neuropathy, 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 difficile colitis 05/22/2019   Hypophosphatemia 05/21/2019   Syncope 05/20/2019   OSA on CPAP    Acute gastroenteritis    Anemia due to blood loss, acute 06/18/2018    Class: Acute   Plantar fasciitis of left foot 06/16/2018    Class: Chronic   Tendonitis, Achilles, right 06/16/2018    Class: Chronic   Osteochondral talar dome lesion 06/16/2018    Class: Chronic   Deep incisional surgical site infection    Superficial incisional surgical site infection 06/11/2018    Class: Acute   Right ankle effusion 06/11/2018    Class: Acute   Morbid (severe) obesity due to excess calories (HCC) 05/29/2018    Class: Chronic   Spondylolisthesis, lumbar region 05/27/2018    Class: Chronic   Spinal stenosis of lumbar region 05/27/2018    Class: Chronic   Urinary tract infection 05/27/2018    Class: Acute   Fusion of spine of lumbar region 05/27/2018   Pain of right hip joint 07/03/2017   Back pain 03/27/2017   Chronic right shoulder pain 02/13/2017   History of rotator cuff tear 11/30/2016   Impingement syndrome of right shoulder 11/30/2016   Exacerbation of asthma 07/18/2016   Acute hypokalemia 07/18/2016   Hypertension  07/18/2016   Depression with anxiety 07/18/2016   Hypothyroidism 07/18/2016   Hyperglycemia 07/18/2016   Asthma, chronic 07/18/2016   Surgical wound dehiscence left hip; questionable superficial infection 11/10/2015   Postoperative wound infection 11/10/2015   Osteoarthritis of left hip 09/09/2015   Status post total replacement of left hip 09/09/2015   Obesity (BMI 35.0-39.9 without comorbidity) 04/28/2013   Past Medical History:  Diagnosis Date   Anemia    taking iron now. pt states having no current issues 09/02/2015   Anginal pain (HCC)    pt states experiences chest  wall pain pt states related to her asthma    Anxiety    with MRI's   Arthritis    Everywhere   Asthma    Depression    Dizziness    GERD (gastroesophageal reflux disease)    Headache(784.0)    HX OF MIGRAINES   History of bronchitis    Hypertension    Hypothyroidism    takes levothyroxen   OSA on CPAP    wears cpap   Shortness of breath    with exertion   Spinal cord stimulator status    Wears glasses     Family History  Problem Relation Age of Onset   Cancer Mother        colon   Epilepsy Mother    Cancer Father        prostate   Diabetes Father    Hypertension Father    Hypertension Maternal Aunt    Diabetes Maternal Aunt    Hypertension Paternal Aunt     Past Surgical History:  Procedure Laterality Date   APPLICATION OF WOUND VAC  11/16/2021   Procedure: APPLICATION OF WOUND VAC;  Surgeon: Cammy Copa, MD;  Location: [facility] OR;  Service: Orthopedics;;   APPLICATION OF WOUND VAC Left 12/01/2021   Procedure: APPLICATION OF WOUND VAC;  Surgeon: Cammy Copa, MD;  Location: [facility] OR;  Service: Orthopedics;  Laterality: Left;   BACK SURGERY     CESAREAN SECTION     times 2   CHOLECYSTECTOMY     COLONOSCOPY  2020   ENDOMETRIAL ABLATION     I & D EXTREMITY Left 12/06/2021   Procedure: LEFT SHOULDER DEBRIDEMENT AND WOUND CLOSURE;  Surgeon: [name], MD;  Location: [facility] OR;   Service: Orthopedics;  Laterality: Left;   I & D EXTREMITY Left 12/20/2021   Procedure: LEFT SHOULDER DEBRIDEMENT;  Surgeon: [name], MD;  Location: [facility] OR;  Service: Orthopedics;  Laterality: Left;   I & D EXTREMITY Left 12/22/2021   Procedure: LEFT SHOULDER DEBRIDEMENT;  Surgeon: [name], MD;  Location: [facility] OR;  Service: Orthopedics;  Laterality: Left;   INCISION AND DRAINAGE Left 12/01/2021   Procedure: LEFT SHOULDER IRRIGATION AND EXCISIONAL DEBRIDEMENT;  Surgeon: Cammy Copa, MD;  Location: [facility] OR;  Service: Orthopedics;  Laterality: Left;   INCISION AND DRAINAGE HIP Left 11/10/2015   Procedure: IRRIGATION AND DEBRIDEMENT LEFT HIP INCISION;  Surgeon: Kathryne Hitch, MD;  Location: [facility] OR;  Service: Orthopedics;  Laterality: Left;   JOINT REPLACEMENT  2011   total left knee   KNEE ARTHROPLASTY  04/23/2012   right    KNEE ARTHROSCOPY     LUMBAR LAMINECTOMY/DECOMPRESSION MICRODISCECTOMY N/A 06/23/2019   Procedure: RIGHT LUMBAR FIVE THROUGH SACRAL ONE PARTIAL HEMILAMINECTOMY WITH RIGHT LUMBAR FIVE FORAMINOTOMY;  Surgeon: Kerrin Champagne, MD;  Location: [facility] OR;  Service: Orthopedics;  Laterality: N/A;   LUMBAR WOUND DEBRIDEMENT N/A 06/11/2018   Procedure: LUMBAR WOUND DEBRIDEMENT;  Surgeon: Kerrin Champagne, MD;  Location: [facility] OR;  Service: Orthopedics;  Laterality: N/A;   RADIOLOGY WITH ANESTHESIA N/A 09/09/2018   Procedure: LUMBER SPINE WITHOUT CONTRAST;  Surgeon: Radiologist, Medication, MD;  Location: [facility] OR;  Service: Radiology;  Laterality: N/A;   REVERSE SHOULDER ARTHROPLASTY Left 11/16/2021   Procedure: LEFT REVERSE SHOULDER ARTHROPLASTY;  Surgeon: Cammy Copa, MD;  Location: [facility] OR;  Service: Orthopedics;  Laterality: Left;   REVERSE SHOULDER ARTHROPLASTY Right 11/14/2023   Procedure: RIGHT REVERSE SHOULDER ARTHROPLASTY;  Surgeon:  Cammy Copa, MD;  Location: [facility] OR;  Service: Orthopedics;  Laterality: Right;   ROTATOR CUFF REPAIR     left     SHOULDER SURGERY     right to repair ligament tear   SPINAL CORD STIMULATOR IMPLANT  08/02/2020   [facility]   TOTAL HIP ARTHROPLASTY Left 09/09/2015   Procedure: LEFT TOTAL HIP ARTHROPLASTY ANTERIOR APPROACH;  Surgeon: Kathryne Hitch, MD;  Location: [facility];  Service: Orthopedics;  Laterality: Left;   TOTAL KNEE ARTHROPLASTY  04/23/2012   Procedure: TOTAL KNEE ARTHROPLASTY;  Surgeon: [name], MD;  Location: [facility] OR;  Service: Orthopedics;  Laterality: Right;  Right Total Knee Arthroplasty   TUBAL LIGATION  1996   Social History   Occupational History   Not on file  Tobacco Use   Smoking status: Former    Current packs/day: 0.00    Average packs/day: 0.5 packs/day for 4.0 years (2.0 ttl pk-yrs)    Types: Cigarettes    Start date: 09/18/1983    Quit date: 09/18/1987    Years since quitting: 36.2   Smokeless tobacco: Never  Vaping Use   Vaping status: Never Used  Substance and Sexual Activity   Alcohol use: No   Drug use: No   Sexual activity: Yes    Birth control/protection: Surgical

## 2023-12-24 ENCOUNTER — Encounter: Payer: Self-pay | Admitting: Orthopedic Surgery

## 2023-12-26 ENCOUNTER — Telehealth: Payer: Self-pay

## 2023-12-26 LAB — WOUND CULTURE
MICRO NUMBER:: 16297374
SPECIMEN QUALITY:: ADEQUATE

## 2023-12-26 NOTE — Telephone Encounter (Signed)
 I called and sw pt to advise of lab results. Voiced understanding and will call with any questions.

## 2023-12-26 NOTE — Telephone Encounter (Signed)
-----   Message from [name] sent at 12/26/2023  3:18 PM EDT ----- Call patient.  Tissue cultures were negative for bacteria. ----- Message ----- From: Interface, [facility] Lab Results In Sent: 12/26/2023   2:10 PM EDT To: [name], MD

## 2024-01-06 ENCOUNTER — Encounter: Payer: Self-pay | Admitting: Orthopedic Surgery

## 2024-01-06 ENCOUNTER — Ambulatory Visit (INDEPENDENT_AMBULATORY_CARE_PROVIDER_SITE_OTHER): Admitting: Orthopedic Surgery

## 2024-01-06 DIAGNOSIS — M1611 Unilateral primary osteoarthritis, right hip: Secondary | ICD-10-CM | POA: Diagnosis not present

## 2024-01-06 DIAGNOSIS — Z96611 Presence of right artificial shoulder joint: Secondary | ICD-10-CM | POA: Diagnosis not present

## 2024-01-06 MED ORDER — HYDROCODONE-ACETAMINOPHEN 5-325 MG PO TABS: 1.0000 | ORAL_TABLET | Freq: Four times a day (QID) | ORAL | 0 refills | Status: AC | PRN

## 2024-01-06 NOTE — Progress Notes (Unsigned)
 Office Visit Note   Patient: Marilyn [name]           Date of Birth: [birth year]           MRN: [id number] Visit Date: 01/06/2024              Requested by: Fonda Hymen, NP [address] [place],  [place] [address] PCP: Fonda Hymen, NP  Chief Complaint  Patient presents with  . Right Shoulder - Routine Post Op      HPI: Patient is a 53 year old woman who is seen in follow-up for buttocks pain that radiates to the anterior aspect of the right thigh.  Patient states she has to grab her thigh to ambulate with a cane on the opposite hand.  She has had injections lumbar spine with Dr. Daisey Dryer with assumption this was radicular pain however patient had no relief.  Assessment & Plan: Visit Diagnoses:  1. S/P reverse total shoulder arthroplasty, right   2. Unilateral primary osteoarthritis, right hip     Plan: Will have patient follow-up with Dr. Lucienne Ryder.  A refill prescription was provided for Vicodin for pain.  Follow-Up Instructions: Return if symptoms worsen or fail to improve.   Ortho Exam  Patient is alert, oriented, no adenopathy, well-dressed, normal affect, normal respiratory effort. Examination patient has a abductor lurch on the right.  She uses a cane in her left hand.  She has 0 degrees of internal rotation of the right hip 20 degrees of external rotation of the right hip.  Radiograph shows osteoarthritis of her left knee and degenerative osteoarthritis of her right hip.  Patient is symptomatic in her left knee but there is no effusion.  Pain most likely from overcompensating with her right hip.  Imaging: No results found. No images are attached to the encounter.  Labs: Lab Results  Component Value Date   HGBA1C 6.2 (H) 07/18/2016   HGBA1C 6.2 (H) 09/02/2015   ESRSEDRATE 106 (H) 12/18/2021   ESRSEDRATE 70 (H) 12/04/2021   ESRSEDRATE 56 (H) 01/08/2019   CRP 5.2 (H) 12/17/2021   CRP 1.8 (H) 12/08/2021   CRP 2.5 (H) 12/04/2021    LABURIC 8.6 (H) 12/18/2021   LABURIC 5.9 01/08/2019   LABURIC 8.7 (H) 06/16/2018   REPTSTATUS 04/11/2023 FINAL 04/07/2023   GRAMSTAIN NO WBC SEEN NO ORGANISMS SEEN  12/20/2021   CULT (A) 04/07/2023    80,000 COLONIES/mL ENTEROCOCCUS FAECALIS 20,000 COLONIES/mL ENTEROCOCCUS AVIUM    LABORGA ENTEROCOCCUS FAECALIS (A) 04/07/2023   LABORGA ENTEROCOCCUS AVIUM (A) 04/07/2023     Lab Results  Component Value Date   ALBUMIN  3.0 (L) 10/14/2023   ALBUMIN  2.8 (L) 10/13/2023   ALBUMIN  3.6 10/12/2023    Lab Results  Component Value Date   MG 1.9 10/14/2023   MG 2.2 10/12/2023   MG 1.8 03/31/2023   Lab Results  Component Value Date   VD25OH 44 10/30/2023    No results found for: "PREALBUMIN"    Latest Ref Rng & Units 11/08/2023   10:38 AM 10/13/2023    7:29 AM 10/12/2023   12:44 PM  CBC EXTENDED  WBC 4.0 - 10.5 K/uL 9.6  7.0  8.7   RBC 3.87 - 5.11 MIL/uL 4.15  3.56  3.57   Hemoglobin 12.0 - 15.0 g/dL 78.2  95.6  21.3   HCT 36.0 - 46.0 % 37.8  31.6  32.6   Platelets 150 - 400 K/uL 264  212  200   NEUT#  1.7 - 7.7 K/uL   6.3   Lymph# 0.7 - 4.0 K/uL   1.3      There is no height or weight on file to calculate BMI.  Orders:  Orders Placed This Encounter  Procedures  . Ambulatory referral to Physical Therapy   No orders of the defined types were placed in this encounter.    Procedures: No procedures performed  Clinical Data: No additional findings.  ROS:  All other systems negative, except as noted in the HPI. Review of Systems  Objective: Vital Signs: LMP  (LMP Unknown) Comment: tubal ligation  Specialty Comments:  No specialty comments available.  PMFS History: Patient Active Problem List   Diagnosis Date Noted  . Arthritis of right shoulder region 11/21/2023  . OA (osteoarthritis) of shoulder 11/14/2023  . S/P reverse total shoulder arthroplasty, right 11/14/2023  . AKI (acute kidney injury) (HCC) 10/12/2023  . Acute urinary retention 10/12/2023  .  Acute renal failure superimposed on stage 3a chronic kidney disease (HCC) 03/31/2023  . 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 Acute metabolic encephalopathy 03/31/2023  . Lactic acidosis 12/18/2021  . Dyspnea on exertion 12/18/2021  . Anemia 12/18/2021  . Gout 12/18/2021  . Acute pain of left shoulder   . Arthritis of left shoulder region   . S/P reverse total shoulder arthroplasty, left 11/16/2021  . Unilateral primary osteoarthritis, right hip 05/09/2021  . S/P lumbar spinal fusion 05/09/2021  . S/P insertion of spinal cord stimulator 05/09/2021  . H/O total knee replacement, bilateral 01/18/2021  . Lumbar post-laminectomy syndrome 04/06/2020  . Long-term current use of opiate analgesic 11/04/2019  . Leg pain, right 10/20/2019  . Dysesthesia 10/20/2019  . 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 Other spondylosis with radiculopathy, lumbar region   . Status post lumbar laminectomy 06/23/2019  . C. difficile colitis 05/22/2019  . Hypophosphatemia 05/21/2019  . Syncope 05/20/2019  . OSA on CPAP   . Acute gastroenteritis   . Anemia due to blood loss, acute 06/18/2018    Class: Acute  . Plantar fasciitis of left foot 06/16/2018    Class: Chronic  . Tendonitis, Achilles, right 06/16/2018    Class: Chronic  . Osteochondral talar dome lesion 06/16/2018    Class: Chronic  . Deep incisional surgical site infection   . Superficial incisional surgical site infection 06/11/2018    Class: Acute  . Right ankle effusion 06/11/2018    Class: Acute  . Morbid (severe) obesity due to excess calories (HCC) 05/29/2018    Class: Chronic  . Spondylolisthesis, lumbar region 05/27/2018    Class: Chronic  . Spinal stenosis of lumbar region 05/27/2018    Class: Chronic  . Urinary tract infection 05/27/2018    Class: Acute  . Fusion of spine of lumbar region 05/27/2018  . Pain of right hip joint 07/03/2017  . Back pain 03/27/2017  . Chronic right shoulder pain 02/13/2017  .  History of rotator cuff tear 11/30/2016  . Impingement syndrome of right shoulder 11/30/2016  . Exacerbation of asthma 07/18/2016  . Acute hypokalemia 07/18/2016  . Hypertension 07/18/2016  . Depression with anxiety 07/18/2016  . Hypothyroidism 07/18/2016  . Hyperglycemia 07/18/2016  . Asthma, chronic 07/18/2016  . Surgical wound dehiscence left hip; questionable superficial infection 11/10/2015  . Postoperative wound infection 11/10/2015  . Osteoarthritis of left hip 09/09/2015  . 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 Obesity (BMI 35.0-39.9 without comorbidity) 04/28/2013   Past Medical History:  Diagnosis Date  .  Anemia    taking iron  now. pt states having no current issues 09/02/2015  . Anginal pain (HCC)    pt states experiences chest wall pain pt states related to her asthma   . Anxiety    with MRI's  . Arthritis    Everywhere  . Asthma   . Depression   . Dizziness   . GERD (gastroesophageal reflux disease)   . Headache(784.0)    HX OF MIGRAINES  . History of bronchitis   . Hypertension   . Hypothyroidism    takes levothyroxen  . OSA on CPAP    wears cpap  . Shortness of breath    with exertion  . Spinal cord stimulator status   . Wears glasses     Family History  Problem Relation Age of Onset  . Cancer Mother        colon  . Epilepsy Mother   . Cancer Father        prostate  . Diabetes Father   . Hypertension Father   . Hypertension Maternal Aunt   . Diabetes Maternal Aunt   . Hypertension Paternal Aunt     Past Surgical History:  Procedure Laterality Date  . APPLICATION OF WOUND VAC  11/16/2021   Procedure: APPLICATION OF WOUND VAC;  Surgeon: Jasmine Mesi, MD;  Location: [facility] OR;  Service: Orthopedics;;  . APPLICATION OF WOUND VAC Left 12/01/2021   Procedure: APPLICATION OF WOUND VAC;  Surgeon: Jasmine Mesi, MD;  Location: [facility] OR;  Service: Orthopedics;  Laterality: Left;  . BACK SURGERY    . CESAREAN SECTION     times  2  . CHOLECYSTECTOMY    . COLONOSCOPY  2020  . ENDOMETRIAL ABLATION    . I & D EXTREMITY Left 12/06/2021   Procedure: LEFT SHOULDER DEBRIDEMENT AND WOUND CLOSURE;  Surgeon: Timothy Ford, MD;  Location: [facility] OR;  Service: Orthopedics;  Laterality: Left;  . I & D EXTREMITY Left 12/20/2021   Procedure: LEFT SHOULDER DEBRIDEMENT;  Surgeon: Timothy Ford, MD;  Location: [facility] OR;  Service: Orthopedics;  Laterality: Left;  . I & D EXTREMITY Left 12/22/2021   Procedure: LEFT SHOULDER DEBRIDEMENT;  Surgeon: Timothy Ford, MD;  Location: [facility] OR;  Service: Orthopedics;  Laterality: Left;  . INCISION AND DRAINAGE Left 12/01/2021   Procedure: LEFT SHOULDER IRRIGATION AND EXCISIONAL DEBRIDEMENT;  Surgeon: Jasmine Mesi, MD;  Location: [facility] OR;  Service: Orthopedics;  Laterality: Left;  . INCISION AND DRAINAGE HIP Left 11/10/2015   Procedure: IRRIGATION AND DEBRIDEMENT LEFT HIP INCISION;  Surgeon: Arnie Lao, MD;  Location: [facility] OR;  Service: Orthopedics;  Laterality: Left;  . JOINT REPLACEMENT  2011   total left knee  . KNEE ARTHROPLASTY  04/23/2012   right   . KNEE ARTHROSCOPY    . LUMBAR LAMINECTOMY/DECOMPRESSION MICRODISCECTOMY N/A 06/23/2019   Procedure: RIGHT LUMBAR FIVE THROUGH SACRAL ONE PARTIAL HEMILAMINECTOMY WITH RIGHT LUMBAR FIVE FORAMINOTOMY;  Surgeon: Alphonso Jean, MD;  Location: [facility] OR;  Service: Orthopedics;  Laterality: N/A;  . LUMBAR WOUND DEBRIDEMENT N/A 06/11/2018   Procedure: LUMBAR WOUND DEBRIDEMENT;  Surgeon: Alphonso Jean, MD;  Location: [facility] OR;  Service: Orthopedics;  Laterality: N/A;  . RADIOLOGY WITH ANESTHESIA N/A 09/09/2018   Procedure: LUMBER SPINE WITHOUT CONTRAST;  Surgeon: Radiologist, Medication, MD;  Location: [facility] OR;  Service: Radiology;  Laterality: N/A;  . REVERSE SHOULDER ARTHROPLASTY Left 11/16/2021   Procedure: LEFT REVERSE SHOULDER ARTHROPLASTY;  Surgeon: Jasmine Mesi, MD;  Location: [facility] OR;  Service: Orthopedics;  Laterality: Left;  . REVERSE  SHOULDER ARTHROPLASTY Right 11/14/2023   Procedure: RIGHT REVERSE SHOULDER ARTHROPLASTY;  Surgeon: Jasmine Mesi, MD;  Location: [facility] OR;  Service: Orthopedics;  Laterality: Right;  . ROTATOR CUFF REPAIR     left   . SHOULDER SURGERY     right to repair ligament tear  . SPINAL CORD STIMULATOR IMPLANT  08/02/2020   [facility]  . TOTAL HIP ARTHROPLASTY Left 09/09/2015   Procedure: LEFT TOTAL HIP ARTHROPLASTY ANTERIOR APPROACH;  Surgeon: Arnie Lao, MD;  Location: [facility] ORS;  Service: Orthopedics;  Laterality: Left;  . TOTAL KNEE ARTHROPLASTY  04/23/2012   Procedure: TOTAL KNEE ARTHROPLASTY;  Surgeon: Timothy Ford, MD;  Location: [facility] OR;  Service: Orthopedics;  Laterality: Right;  Right Total Knee Arthroplasty  . TUBAL LIGATION  1996   Social History   Occupational History  . Not on file  Tobacco Use  . Smoking status: Former    Current packs/day: 0.00    Average packs/day: 0.5 packs/day for 4.0 years (2.0 ttl pk-yrs)    Types: Cigarettes    Start date: 09/18/1983    Quit date: 09/18/1987    Years since quitting: 36.3  . Smokeless tobacco: Never  Vaping Use  . Vaping status: Never Used  Substance and Sexual Activity  . Alcohol  use: No  . Drug use: No  . Sexual activity: Yes    Birth control/protection: Surgical

## 2024-01-09 ENCOUNTER — Ambulatory Visit: Admitting: Physical Therapy

## 2024-01-15 ENCOUNTER — Other Ambulatory Visit: Payer: Self-pay

## 2024-01-15 ENCOUNTER — Ambulatory Visit: Attending: Orthopedic Surgery | Admitting: Physical Therapy

## 2024-01-15 DIAGNOSIS — M25511 Pain in right shoulder: Secondary | ICD-10-CM | POA: Insufficient documentation

## 2024-01-15 DIAGNOSIS — G8929 Other chronic pain: Secondary | ICD-10-CM | POA: Insufficient documentation

## 2024-01-15 DIAGNOSIS — M25611 Stiffness of right shoulder, not elsewhere classified: Secondary | ICD-10-CM | POA: Diagnosis present

## 2024-01-15 DIAGNOSIS — Z96611 Presence of right artificial shoulder joint: Secondary | ICD-10-CM | POA: Diagnosis not present

## 2024-01-15 NOTE — Therapy (Signed)
 OUTPATIENT PHYSICAL THERAPY SHOULDER EVALUATION   Patient Name: Marilyn [name] MRN: [id number] DOB:May 09, 1971, 53 y.o., female Today's Date: 01/15/2024  END OF SESSION:  PT End of Session - 01/15/24 1518     Visit Number 1    Number of Visits 12    Date for PT Re-Evaluation 02/26/24    PT Start Time 0108    PT Stop Time 0144    PT Time Calculation (min) 36 min    Activity Tolerance Patient tolerated treatment well    Behavior During Therapy [facility] for tasks assessed/performed             Past Medical History:  Diagnosis Date   Anemia    taking iron  now. pt states having no current issues 09/02/2015   Anginal pain (HCC)    pt states experiences chest wall pain pt states related to her asthma    Anxiety    with MRI's   Arthritis    Everywhere   Asthma    Depression    Dizziness    GERD (gastroesophageal reflux disease)    Headache(784.0)    HX OF MIGRAINES   History of bronchitis    Hypertension    Hypothyroidism    takes levothyroxen   OSA on CPAP    wears cpap   Shortness of breath    with exertion   Spinal cord stimulator status    Wears glasses    Past Surgical History:  Procedure Laterality Date   APPLICATION OF WOUND VAC  11/16/2021   Procedure: APPLICATION OF WOUND VAC;  Surgeon: Jasmine Mesi, MD;  Location: [facility] OR;  Service: Orthopedics;;   APPLICATION OF WOUND VAC Left 12/01/2021   Procedure: APPLICATION OF WOUND VAC;  Surgeon: Jasmine Mesi, MD;  Location: [facility] OR;  Service: Orthopedics;  Laterality: Left;   BACK SURGERY     CESAREAN SECTION     times 2   CHOLECYSTECTOMY     COLONOSCOPY  2020   ENDOMETRIAL ABLATION     I & D EXTREMITY Left 12/06/2021   Procedure: LEFT SHOULDER DEBRIDEMENT AND WOUND CLOSURE;  Surgeon: Timothy Ford, MD;  Location: [facility] OR;  Service: Orthopedics;  Laterality: Left;   I & D EXTREMITY Left 12/20/2021   Procedure: LEFT SHOULDER DEBRIDEMENT;  Surgeon: Timothy Ford, MD;  Location: [facility] OR;  Service:  Orthopedics;  Laterality: Left;   I & D EXTREMITY Left 12/22/2021   Procedure: LEFT SHOULDER DEBRIDEMENT;  Surgeon: Timothy Ford, MD;  Location: [facility] OR;  Service: Orthopedics;  Laterality: Left;   INCISION AND DRAINAGE Left 12/01/2021   Procedure: LEFT SHOULDER IRRIGATION AND EXCISIONAL DEBRIDEMENT;  Surgeon: Jasmine Mesi, MD;  Location: [facility] OR;  Service: Orthopedics;  Laterality: Left;   INCISION AND DRAINAGE HIP Left 11/10/2015   Procedure: IRRIGATION AND DEBRIDEMENT LEFT HIP INCISION;  Surgeon: Arnie Lao, MD;  Location: [facility] OR;  Service: Orthopedics;  Laterality: Left;   JOINT REPLACEMENT  2011   total left knee   KNEE ARTHROPLASTY  04/23/2012   right    KNEE ARTHROSCOPY     LUMBAR LAMINECTOMY/DECOMPRESSION MICRODISCECTOMY N/A 06/23/2019   Procedure: RIGHT LUMBAR FIVE THROUGH SACRAL ONE PARTIAL HEMILAMINECTOMY WITH RIGHT LUMBAR FIVE FORAMINOTOMY;  Surgeon: Alphonso Jean, MD;  Location: [facility] OR;  Service: Orthopedics;  Laterality: N/A;   LUMBAR WOUND DEBRIDEMENT N/A 06/11/2018   Procedure: LUMBAR WOUND DEBRIDEMENT;  Surgeon: Alphonso Jean, MD;  Location: [facility] OR;  Service: Orthopedics;  Laterality: N/A;  RADIOLOGY WITH ANESTHESIA N/A 09/09/2018   Procedure: LUMBER SPINE WITHOUT CONTRAST;  Surgeon: Radiologist, Medication, MD;  Location: [facility] OR;  Service: Radiology;  Laterality: N/A;   REVERSE SHOULDER ARTHROPLASTY Left 11/16/2021   Procedure: LEFT REVERSE SHOULDER ARTHROPLASTY;  Surgeon: Jasmine Mesi, MD;  Location: [facility] OR;  Service: Orthopedics;  Laterality: Left;   REVERSE SHOULDER ARTHROPLASTY Right 11/14/2023   Procedure: RIGHT REVERSE SHOULDER ARTHROPLASTY;  Surgeon: Jasmine Mesi, MD;  Location: [facility] OR;  Service: Orthopedics;  Laterality: Right;   ROTATOR CUFF REPAIR     left    SHOULDER SURGERY     right to repair ligament tear   SPINAL CORD STIMULATOR IMPLANT  08/02/2020   [facility]   TOTAL HIP ARTHROPLASTY Left 09/09/2015   Procedure: LEFT TOTAL HIP  ARTHROPLASTY ANTERIOR APPROACH;  Surgeon: Arnie Lao, MD;  Location: [facility];  Service: Orthopedics;  Laterality: Left;   TOTAL KNEE ARTHROPLASTY  04/23/2012   Procedure: TOTAL KNEE ARTHROPLASTY;  Surgeon: Timothy Ford, MD;  Location: [facility] OR;  Service: Orthopedics;  Laterality: Right;  Right Total Knee Arthroplasty   TUBAL LIGATION  1996   Patient Active Problem List   Diagnosis Date Noted   Arthritis of right shoulder region 11/21/2023   OA (osteoarthritis) of shoulder 11/14/2023   S/P reverse total shoulder arthroplasty, right 11/14/2023   AKI (acute kidney injury) (HCC) 10/12/2023   Acute urinary retention 10/12/2023   Acute renal failure superimposed on stage 3a chronic kidney disease (HCC) 03/31/2023   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   Acute metabolic encephalopathy 03/31/2023   Lactic acidosis 12/18/2021   Dyspnea on exertion 12/18/2021   Anemia 12/18/2021   Gout 12/18/2021   Acute pain of left shoulder    Arthritis of left shoulder region    S/P reverse total shoulder arthroplasty, left 11/16/2021   Unilateral primary osteoarthritis, right hip 05/09/2021   S/P lumbar spinal fusion 05/09/2021   S/P insertion of spinal cord stimulator 05/09/2021   H/O total knee replacement, bilateral 01/18/2021   Lumbar post-laminectomy syndrome 04/06/2020   Long-term current use of opiate analgesic 11/04/2019   Leg pain, right 10/20/2019   Dysesthesia 10/20/2019   Lateral femoral cutaneous neuropathy, 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 difficile colitis 05/22/2019   Hypophosphatemia 05/21/2019   Syncope 05/20/2019   OSA on CPAP    Acute gastroenteritis    Anemia due to blood loss, acute 06/18/2018    Class: Acute   Plantar fasciitis of left foot 06/16/2018    Class: Chronic   Tendonitis, Achilles, right 06/16/2018    Class: Chronic   Osteochondral talar dome lesion 06/16/2018     Class: Chronic   Deep incisional surgical site infection    Superficial incisional surgical site infection 06/11/2018    Class: Acute   Right ankle effusion 06/11/2018    Class: Acute   Morbid (severe) obesity due to excess calories (HCC) 05/29/2018    Class: Chronic   Spondylolisthesis, lumbar region 05/27/2018    Class: Chronic   Spinal stenosis of lumbar region 05/27/2018    Class: Chronic   Urinary tract infection 05/27/2018    Class: Acute   Fusion of spine of lumbar region 05/27/2018   Pain of right hip joint 07/03/2017   Back pain 03/27/2017   Chronic right shoulder pain 02/13/2017   History of rotator cuff tear 11/30/2016   Impingement syndrome of right shoulder 11/30/2016  Exacerbation of asthma 07/18/2016   Acute hypokalemia 07/18/2016   Hypertension 07/18/2016   Depression with anxiety 07/18/2016   Hypothyroidism 07/18/2016   Hyperglycemia 07/18/2016   Asthma, chronic 07/18/2016   Surgical wound dehiscence left hip; questionable superficial infection 11/10/2015   Postoperative wound infection 11/10/2015   Osteoarthritis of left hip 09/09/2015   Status post total replacement of left hip 09/09/2015   Obesity (BMI 35.0-39.9 without comorbidity) 04/28/2013    REFERRING PROVIDER: Gearldean Keepers MD  REFERRING DIAG: Reverse total shoulder  THERAPY DIAG:  Chronic right shoulder pain  Stiffness of right shoulder, not elsewhere classified  Rationale for Evaluation and Treatment: Rehabilitation  ONSET DATE: Ongoing.  Surgery (11/14/23).  SUBJECTIVE:                                                                                                                                                                                      SUBJECTIVE STATEMENT: The patient presents to the clinic s/p right total shoulder replacement performed on 11/14/23.  She states she had some HH PT.  She has had some drainage from the distal end of her incision and has been receiving treatment,  including oral antibiotics and states it has improved significantly.  No visible signs of drainage observed today.  She reports no pain.    PERTINENT HISTORY: Please see above.    PAIN:  Are you having pain? No  PRECAUTIONS: Other: Per protocol.  No ultrasound.    FALLS:  Has patient fallen in last 6 months? Yes. Number of falls "Bad knees." Using cane.  LIVING ENVIRONMENT: Lives in: House/apartment Has following equipment at home: Single point cane  OCCUPATION: Disabled.  PLOF: Independent with basic ADLs and Independent with household mobility with device  PATIENT GOALS:Use right shoulder without pain.   NEXT MD VISIT:   OBJECTIVE:  Note: Objective measures were completed at Evaluation unless otherwise noted.   PATIENT SURVEYS:  Quick Dash 72.72   UPPER EXTREMITY ROM:   In supine:  Gentle right shoulder passive flexion to 90 degrees of flexion, ER 20 degrees and abduction to 60 degrees.  PALPATION:  Mild tenderness over proximal incisional site.  TREATMENT DATE: In supine:  Gentle PROM x 10 minutes to patient's right shoulder into flexion, ER and abduction.     PATIENT EDUCATION: Education details:  Person educated:  International aid/development worker:  Education comprehension:   HOME EXERCISE PROGRAM:   ASSESSMENT:  CLINICAL IMPRESSION: The patient patient presents to OPPT s/p right reverse total shoulder replacement. She is pleased with her progress thus far and reports no pain.  She has an expected loss of range of motion.  She is currently on antibiotics.  Her Quick DASH is 72.72.  Patient will benefit from skilled physical therapy intervention to address pain and deficits.  OBJECTIVE IMPAIRMENTS: decreased activity tolerance, decreased ROM, and pain.   ACTIVITY LIMITATIONS: carrying, lifting, and reach over head  PARTICIPATION LIMITATIONS: meal  prep, cleaning, and laundry  PERSONAL FACTORS: Time since onset of injury/illness/exacerbation are also affecting patient's functional outcome.   REHAB POTENTIAL: Good  CLINICAL DECISION MAKING: Stable/uncomplicated  EVALUATION COMPLEXITY: Low   GOALS:   SHORT TERM GOALS: Target date: 01/29/24  Ind with an initial HEP. Goal status: INITIAL   LONG TERM GOALS: Target date: 02/26/24  Ind with an advanced HEP.  Goal status: INITIAL  2.  Active right shoulder flexion to 125 degrees so the patient can easily reach overhead. Baseline:  Goal status: INITIAL  3.  Active ER to 60 degrees+ to allow for easily donning/doffing of apparel. Goal status: INITIAL  4.  Increase right shoulder strength to a solid 4/5 to increase stability for performance of functional activities.  Goal status: INITIAL  5.  Perform ADL's with pain not > 3/10.  Goal status: INITIAL  PLAN:  PT FREQUENCY: 2x/week  PT DURATION: 6 weeks  PLANNED INTERVENTIONS: 97110-Therapeutic exercises, 97530- Therapeutic activity, [id number]- Neuromuscular re-education, 97535- Self Care, [id number]- Manual therapy, [id number]- Electrical stimulation (unattended), [id number]- Vasopneumatic device, Cryotherapy, and Moist heat  PLAN FOR NEXT SESSION: Progress per protocol.     Kourtlynn Trevor, [place], PT 01/15/2024, 3:57 PM

## 2024-01-21 ENCOUNTER — Encounter: Admitting: *Deleted

## 2024-01-23 ENCOUNTER — Ambulatory Visit: Attending: Orthopedic Surgery | Admitting: *Deleted

## 2024-01-23 DIAGNOSIS — M25511 Pain in right shoulder: Secondary | ICD-10-CM | POA: Diagnosis present

## 2024-01-23 DIAGNOSIS — G8929 Other chronic pain: Secondary | ICD-10-CM | POA: Insufficient documentation

## 2024-01-23 DIAGNOSIS — M5459 Other low back pain: Secondary | ICD-10-CM | POA: Insufficient documentation

## 2024-01-23 DIAGNOSIS — M25611 Stiffness of right shoulder, not elsewhere classified: Secondary | ICD-10-CM | POA: Insufficient documentation

## 2024-01-23 NOTE — Therapy (Signed)
 OUTPATIENT PHYSICAL THERAPY SHOULDER TREATMENT   Patient Name: Marilyn [name] MRN: [id number] DOB:Jun 27, 1971, 53 y.o., female Today's Date: 01/23/2024  END OF SESSION:  PT End of Session - 01/23/24 1030     Visit Number 2    Number of Visits 12    Date for PT Re-Evaluation 02/26/24    Authorization Type FOTO AT LEAST EVERY 5TH VISIT.  PROGRESS NOTE AT 10TH VISIT.  KX MODIFIER AFTER 15 VISITS.    Authorization - Number of Visits 12     PT Start Time 1020    PT Stop Time 1109    PT Time Calculation (min) 49 min             Past Medical History:  Diagnosis Date   Anemia    taking iron  now. pt states having no current issues 09/02/2015   Anginal pain (HCC)    pt states experiences chest wall pain pt states related to her asthma    Anxiety    with MRI's   Arthritis    Everywhere   Asthma    Depression    Dizziness    GERD (gastroesophageal reflux disease)    Headache(784.0)    HX OF MIGRAINES   History of bronchitis    Hypertension    Hypothyroidism    takes levothyroxen   OSA on CPAP    wears cpap   Shortness of breath    with exertion   Spinal cord stimulator status    Wears glasses    Past Surgical History:  Procedure Laterality Date   APPLICATION OF WOUND VAC  11/16/2021   Procedure: APPLICATION OF WOUND VAC;  Surgeon: Jasmine Mesi, MD;  Location: [facility] OR;  Service: Orthopedics;;   APPLICATION OF WOUND VAC Left 12/01/2021   Procedure: APPLICATION OF WOUND VAC;  Surgeon: Jasmine Mesi, MD;  Location: [facility] OR;  Service: Orthopedics;  Laterality: Left;   BACK SURGERY     CESAREAN SECTION     times 2   CHOLECYSTECTOMY     COLONOSCOPY  2020   ENDOMETRIAL ABLATION     I & D EXTREMITY Left 12/06/2021   Procedure: LEFT SHOULDER DEBRIDEMENT AND WOUND CLOSURE;  Surgeon: Timothy Ford, MD;  Location: [facility] OR;  Service: Orthopedics;  Laterality: Left;   I & D EXTREMITY Left 12/20/2021   Procedure: LEFT SHOULDER DEBRIDEMENT;  Surgeon: Timothy Ford, MD;   Location: [facility] OR;  Service: Orthopedics;  Laterality: Left;   I & D EXTREMITY Left 12/22/2021   Procedure: LEFT SHOULDER DEBRIDEMENT;  Surgeon: Timothy Ford, MD;  Location: [facility] OR;  Service: Orthopedics;  Laterality: Left;   INCISION AND DRAINAGE Left 12/01/2021   Procedure: LEFT SHOULDER IRRIGATION AND EXCISIONAL DEBRIDEMENT;  Surgeon: Jasmine Mesi, MD;  Location: [facility] OR;  Service: Orthopedics;  Laterality: Left;   INCISION AND DRAINAGE HIP Left 11/10/2015   Procedure: IRRIGATION AND DEBRIDEMENT LEFT HIP INCISION;  Surgeon: Arnie Lao, MD;  Location: [facility] OR;  Service: Orthopedics;  Laterality: Left;   JOINT REPLACEMENT  2011   total left knee   KNEE ARTHROPLASTY  04/23/2012   right    KNEE ARTHROSCOPY     LUMBAR LAMINECTOMY/DECOMPRESSION MICRODISCECTOMY N/A 06/23/2019   Procedure: RIGHT LUMBAR FIVE THROUGH SACRAL ONE PARTIAL HEMILAMINECTOMY WITH RIGHT LUMBAR FIVE FORAMINOTOMY;  Surgeon: Alphonso Jean, MD;  Location: [facility] OR;  Service: Orthopedics;  Laterality: N/A;   LUMBAR WOUND DEBRIDEMENT N/A 06/11/2018   Procedure: LUMBAR WOUND DEBRIDEMENT;  Surgeon: Richardo Chandler,  Parnell Bologna, MD;  Location: [facility] OR;  Service: Orthopedics;  Laterality: N/A;   RADIOLOGY WITH ANESTHESIA N/A 09/09/2018   Procedure: LUMBER SPINE WITHOUT CONTRAST;  Surgeon: Radiologist, Medication, MD;  Location: [facility] OR;  Service: Radiology;  Laterality: N/A;   REVERSE SHOULDER ARTHROPLASTY Left 11/16/2021   Procedure: LEFT REVERSE SHOULDER ARTHROPLASTY;  Surgeon: Jasmine Mesi, MD;  Location: [facility] OR;  Service: Orthopedics;  Laterality: Left;   REVERSE SHOULDER ARTHROPLASTY Right 11/14/2023   Procedure: RIGHT REVERSE SHOULDER ARTHROPLASTY;  Surgeon: Jasmine Mesi, MD;  Location: [facility] OR;  Service: Orthopedics;  Laterality: Right;   ROTATOR CUFF REPAIR     left    SHOULDER SURGERY     right to repair ligament tear   SPINAL CORD STIMULATOR IMPLANT  08/02/2020   [facility]   TOTAL HIP ARTHROPLASTY Left 09/09/2015    Procedure: LEFT TOTAL HIP ARTHROPLASTY ANTERIOR APPROACH;  Surgeon: Arnie Lao, MD;  Location: [facility];  Service: Orthopedics;  Laterality: Left;   TOTAL KNEE ARTHROPLASTY  04/23/2012   Procedure: TOTAL KNEE ARTHROPLASTY;  Surgeon: Timothy Ford, MD;  Location: [facility] OR;  Service: Orthopedics;  Laterality: Right;  Right Total Knee Arthroplasty   TUBAL LIGATION  1996   Patient Active Problem List   Diagnosis Date Noted   Arthritis of right shoulder region 11/21/2023   OA (osteoarthritis) of shoulder 11/14/2023   S/P reverse total shoulder arthroplasty, right 11/14/2023   AKI (acute kidney injury) (HCC) 10/12/2023   Acute urinary retention 10/12/2023   Acute renal failure superimposed on stage 3a chronic kidney disease (HCC) 03/31/2023   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   Acute metabolic encephalopathy 03/31/2023   Lactic acidosis 12/18/2021   Dyspnea on exertion 12/18/2021   Anemia 12/18/2021   Gout 12/18/2021   Acute pain of left shoulder    Arthritis of left shoulder region    S/P reverse total shoulder arthroplasty, left 11/16/2021   Unilateral primary osteoarthritis, right hip 05/09/2021   S/P lumbar spinal fusion 05/09/2021   S/P insertion of spinal cord stimulator 05/09/2021   H/O total knee replacement, bilateral 01/18/2021   Lumbar post-laminectomy syndrome 04/06/2020   Long-term current use of opiate analgesic 11/04/2019   Leg pain, right 10/20/2019   Dysesthesia 10/20/2019   Lateral femoral cutaneous neuropathy, 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 difficile colitis 05/22/2019   Hypophosphatemia 05/21/2019   Syncope 05/20/2019   OSA on CPAP    Acute gastroenteritis    Anemia due to blood loss, acute 06/18/2018    Class: Acute   Plantar fasciitis of left foot 06/16/2018    Class: Chronic   Tendonitis, Achilles, right 06/16/2018    Class: Chronic   Osteochondral talar dome  lesion 06/16/2018    Class: Chronic   Deep incisional surgical site infection    Superficial incisional surgical site infection 06/11/2018    Class: Acute   Right ankle effusion 06/11/2018    Class: Acute   Morbid (severe) obesity due to excess calories (HCC) 05/29/2018    Class: Chronic   Spondylolisthesis, lumbar region 05/27/2018    Class: Chronic   Spinal stenosis of lumbar region 05/27/2018    Class: Chronic   Urinary tract infection 05/27/2018    Class: Acute   Fusion of spine of lumbar region 05/27/2018   Pain of right hip joint 07/03/2017   Back pain 03/27/2017   Chronic right shoulder pain 02/13/2017   History  of rotator cuff tear 11/30/2016   Impingement syndrome of right shoulder 11/30/2016   Exacerbation of asthma 07/18/2016   Acute hypokalemia 07/18/2016   Hypertension 07/18/2016   Depression with anxiety 07/18/2016   Hypothyroidism 07/18/2016   Hyperglycemia 07/18/2016   Asthma, chronic 07/18/2016   Surgical wound dehiscence left hip; questionable superficial infection 11/10/2015   Postoperative wound infection 11/10/2015   Osteoarthritis of left hip 09/09/2015   Status post total replacement of left hip 09/09/2015   Obesity (BMI 35.0-39.9 without comorbidity) 04/28/2013    REFERRING PROVIDER: Gearldean Keepers MD  REFERRING DIAG: Reverse total shoulder  THERAPY DIAG:  Chronic right shoulder pain  Stiffness of right shoulder, not elsewhere classified  Other low back pain  Rationale for Evaluation and Treatment: Rehabilitation  ONSET DATE: Ongoing.  Surgery (11/14/23).RT shldr  SUBJECTIVE:                                                                                                                                                                                      SUBJECTIVE STATEMENT: The patient presents to the clinic s/p right total shoulder replacement performed on 11/14/23.  4-5/10 RT shldr pain today     PERTINENT HISTORY: Please see above.     PAIN:  Are you having pain? No  PRECAUTIONS: Other: Per protocol.  No ultrasound.    FALLS:  Has patient fallen in last 6 months? Yes. Number of falls "Bad knees." Using cane.  LIVING ENVIRONMENT: Lives in: House/apartment Has following equipment at home: Single point cane  OCCUPATION: Disabled.  PLOF: Independent with basic ADLs and Independent with household mobility with device  PATIENT GOALS:Use right shoulder without pain.   NEXT MD VISIT:   OBJECTIVE:  Note: Objective measures were completed at Evaluation unless otherwise noted.   PATIENT SURVEYS:  Quick Dash 72.72   UPPER EXTREMITY ROM:   In supine:  Gentle right shoulder passive flexion to 90 degrees of flexion, ER 20 degrees and abduction to 60 degrees.  PALPATION:  Mild tenderness over proximal incisional site.  TREATMENT DATE: 01-23-24 Pulleys   x 5 mins Seated UE ranger   x 6 mins  Seated  Manual: :  Gentle PROM/  AAROM x 15 minutes to patient's right shoulder into flexion, ER and abduction.   IFC 80-150hz  x 15 mins with HP to  RT shldr seated.   PATIENT EDUCATION: Education details:  Person educated:  International aid/development worker:  Education comprehension:   HOME EXERCISE PROGRAM:   ASSESSMENT:  CLINICAL IMPRESSION: The patient patient presents to [facility] s/p right reverse total shoulder replacement.  Rx focused on AAROM therex as well as manual PROM and AAROM for elevation and ER. IFC and HMP end of session.   OBJECTIVE IMPAIRMENTS: decreased activity tolerance, decreased ROM, and pain.   ACTIVITY LIMITATIONS: carrying, lifting, and reach over head  PARTICIPATION LIMITATIONS: meal prep, cleaning, and laundry  PERSONAL FACTORS: Time since onset of injury/illness/exacerbation are also affecting patient's functional outcome.   REHAB POTENTIAL: Good  CLINICAL DECISION MAKING:  Stable/uncomplicated  EVALUATION COMPLEXITY: Low   GOALS:   SHORT TERM GOALS: Target date: 01/29/24  Ind with an initial HEP. Goal status: INITIAL   LONG TERM GOALS: Target date: 02/26/24  Ind with an advanced HEP.  Goal status: INITIAL  2.  Active right shoulder flexion to 125 degrees so the patient can easily reach overhead. Baseline:  Goal status: INITIAL  3.  Active ER to 60 degrees+ to allow for easily donning/doffing of apparel. Goal status: INITIAL  4.  Increase right shoulder strength to a solid 4/5 to increase stability for performance of functional activities.  Goal status: INITIAL  5.  Perform ADL's with pain not > 3/10.  Goal status: INITIAL  PLAN:  PT FREQUENCY: 2x/week  PT DURATION: 6 weeks  PLANNED INTERVENTIONS: 97110-Therapeutic exercises, 97530- Therapeutic activity, [id number]- Neuromuscular re-education, 97535- Self Care, 56433- Manual therapy, [id number]- Electrical stimulation (unattended), 97016- Vasopneumatic device, Cryotherapy, and Moist heat  PLAN FOR NEXT SESSION: Progress per protocol.     Brittni Hult,CHRIS, PTA 01/23/2024, 2:39 PM

## 2024-01-27 ENCOUNTER — Ambulatory Visit: Admitting: Physical Therapy

## 2024-01-27 DIAGNOSIS — M25611 Stiffness of right shoulder, not elsewhere classified: Secondary | ICD-10-CM

## 2024-01-27 DIAGNOSIS — M25511 Pain in right shoulder: Secondary | ICD-10-CM | POA: Diagnosis not present

## 2024-01-27 DIAGNOSIS — G8929 Other chronic pain: Secondary | ICD-10-CM

## 2024-01-27 NOTE — Therapy (Signed)
 OUTPATIENT PHYSICAL THERAPY SHOULDER TREATMENT   Patient Name: Marilyn [name] MRN: [id number] DOB:Apr 19, 1971, 53 y.o., female Today's Date: 01/27/2024  END OF SESSION:  PT End of Session - 01/27/24 1457     Visit Number 3    Number of Visits 12    Date for PT Re-Evaluation 02/26/24    Authorization Type FOTO AT LEAST EVERY 5TH VISIT.  PROGRESS NOTE AT 10TH VISIT.  KX MODIFIER AFTER 15 VISITS.    PT Start Time 0203    PT Stop Time 0256    PT Time Calculation (min) 53 min    Activity Tolerance Patient tolerated treatment well    Behavior During Therapy [facility] for tasks assessed/performed              Past Medical History:  Diagnosis Date   Anemia    taking iron  now. pt states having no current issues 09/02/2015   Anginal pain (HCC)    pt states experiences chest wall pain pt states related to her asthma    Anxiety    with MRI's   Arthritis    Everywhere   Asthma    Depression    Dizziness    GERD (gastroesophageal reflux disease)    Headache(784.0)    HX OF MIGRAINES   History of bronchitis    Hypertension    Hypothyroidism    takes levothyroxen   OSA on CPAP    wears cpap   Shortness of breath    with exertion   Spinal cord stimulator status    Wears glasses    Past Surgical History:  Procedure Laterality Date   APPLICATION OF WOUND VAC  11/16/2021   Procedure: APPLICATION OF WOUND VAC;  Surgeon: Jasmine Mesi, MD;  Location: [facility] OR;  Service: Orthopedics;;   APPLICATION OF WOUND VAC Left 12/01/2021   Procedure: APPLICATION OF WOUND VAC;  Surgeon: Jasmine Mesi, MD;  Location: [facility] OR;  Service: Orthopedics;  Laterality: Left;   BACK SURGERY     CESAREAN SECTION     times 2   CHOLECYSTECTOMY     COLONOSCOPY  2020   ENDOMETRIAL ABLATION     I & D EXTREMITY Left 12/06/2021   Procedure: LEFT SHOULDER DEBRIDEMENT AND WOUND CLOSURE;  Surgeon: Timothy Ford, MD;  Location: [facility] OR;  Service: Orthopedics;  Laterality: Left;   I & D EXTREMITY Left  12/20/2021   Procedure: LEFT SHOULDER DEBRIDEMENT;  Surgeon: Timothy Ford, MD;  Location: [facility] OR;  Service: Orthopedics;  Laterality: Left;   I & D EXTREMITY Left 12/22/2021   Procedure: LEFT SHOULDER DEBRIDEMENT;  Surgeon: Timothy Ford, MD;  Location: [facility] OR;  Service: Orthopedics;  Laterality: Left;   INCISION AND DRAINAGE Left 12/01/2021   Procedure: LEFT SHOULDER IRRIGATION AND EXCISIONAL DEBRIDEMENT;  Surgeon: Jasmine Mesi, MD;  Location: [facility] OR;  Service: Orthopedics;  Laterality: Left;   INCISION AND DRAINAGE HIP Left 11/10/2015   Procedure: IRRIGATION AND DEBRIDEMENT LEFT HIP INCISION;  Surgeon: Arnie Lao, MD;  Location: [facility] OR;  Service: Orthopedics;  Laterality: Left;   JOINT REPLACEMENT  2011   total left knee   KNEE ARTHROPLASTY  04/23/2012   right    KNEE ARTHROSCOPY     LUMBAR LAMINECTOMY/DECOMPRESSION MICRODISCECTOMY N/A 06/23/2019   Procedure: RIGHT LUMBAR FIVE THROUGH SACRAL ONE PARTIAL HEMILAMINECTOMY WITH RIGHT LUMBAR FIVE FORAMINOTOMY;  Surgeon: Alphonso Jean, MD;  Location: [facility] OR;  Service: Orthopedics;  Laterality: N/A;   LUMBAR WOUND DEBRIDEMENT N/A  06/11/2018   Procedure: LUMBAR WOUND DEBRIDEMENT;  Surgeon: Alphonso Jean, MD;  Location: [facility] OR;  Service: Orthopedics;  Laterality: N/A;   RADIOLOGY WITH ANESTHESIA N/A 09/09/2018   Procedure: LUMBER SPINE WITHOUT CONTRAST;  Surgeon: Radiologist, Medication, MD;  Location: [facility] OR;  Service: Radiology;  Laterality: N/A;   REVERSE SHOULDER ARTHROPLASTY Left 11/16/2021   Procedure: LEFT REVERSE SHOULDER ARTHROPLASTY;  Surgeon: Jasmine Mesi, MD;  Location: [facility] OR;  Service: Orthopedics;  Laterality: Left;   REVERSE SHOULDER ARTHROPLASTY Right 11/14/2023   Procedure: RIGHT REVERSE SHOULDER ARTHROPLASTY;  Surgeon: Jasmine Mesi, MD;  Location: [facility] OR;  Service: Orthopedics;  Laterality: Right;   ROTATOR CUFF REPAIR     left    SHOULDER SURGERY     right to repair ligament tear   SPINAL CORD  STIMULATOR IMPLANT  08/02/2020   [facility]   TOTAL HIP ARTHROPLASTY Left 09/09/2015   Procedure: LEFT TOTAL HIP ARTHROPLASTY ANTERIOR APPROACH;  Surgeon: Arnie Lao, MD;  Location: [facility];  Service: Orthopedics;  Laterality: Left;   TOTAL KNEE ARTHROPLASTY  04/23/2012   Procedure: TOTAL KNEE ARTHROPLASTY;  Surgeon: Timothy Ford, MD;  Location: [facility] OR;  Service: Orthopedics;  Laterality: Right;  Right Total Knee Arthroplasty   TUBAL LIGATION  1996   Patient Active Problem List   Diagnosis Date Noted   Arthritis of right shoulder region 11/21/2023   OA (osteoarthritis) of shoulder 11/14/2023   S/P reverse total shoulder arthroplasty, right 11/14/2023   AKI (acute kidney injury) (HCC) 10/12/2023   Acute urinary retention 10/12/2023   Acute renal failure superimposed on stage 3a chronic kidney disease (HCC) 03/31/2023   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   Acute metabolic encephalopathy 03/31/2023   Lactic acidosis 12/18/2021   Dyspnea on exertion 12/18/2021   Anemia 12/18/2021   Gout 12/18/2021   Acute pain of left shoulder    Arthritis of left shoulder region    S/P reverse total shoulder arthroplasty, left 11/16/2021   Unilateral primary osteoarthritis, right hip 05/09/2021   S/P lumbar spinal fusion 05/09/2021   S/P insertion of spinal cord stimulator 05/09/2021   H/O total knee replacement, bilateral 01/18/2021   Lumbar post-laminectomy syndrome 04/06/2020   Long-term current use of opiate analgesic 11/04/2019   Leg pain, right 10/20/2019   Dysesthesia 10/20/2019   Lateral femoral cutaneous neuropathy, 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 difficile colitis 05/22/2019   Hypophosphatemia 05/21/2019   Syncope 05/20/2019   OSA on CPAP    Acute gastroenteritis    Anemia due to blood loss, acute 06/18/2018    Class: Acute   Plantar fasciitis of left foot 06/16/2018    Class: Chronic    Tendonitis, Achilles, right 06/16/2018    Class: Chronic   Osteochondral talar dome lesion 06/16/2018    Class: Chronic   Deep incisional surgical site infection    Superficial incisional surgical site infection 06/11/2018    Class: Acute   Right ankle effusion 06/11/2018    Class: Acute   Morbid (severe) obesity due to excess calories (HCC) 05/29/2018    Class: Chronic   Spondylolisthesis, lumbar region 05/27/2018    Class: Chronic   Spinal stenosis of lumbar region 05/27/2018    Class: Chronic   Urinary tract infection 05/27/2018    Class: Acute   Fusion of spine of lumbar region 05/27/2018   Pain of right hip joint 07/03/2017   Back pain 03/27/2017  Chronic right shoulder pain 02/13/2017   History of rotator cuff tear 11/30/2016   Impingement syndrome of right shoulder 11/30/2016   Exacerbation of asthma 07/18/2016   Acute hypokalemia 07/18/2016   Hypertension 07/18/2016   Depression with anxiety 07/18/2016   Hypothyroidism 07/18/2016   Hyperglycemia 07/18/2016   Asthma, chronic 07/18/2016   Surgical wound dehiscence left hip; questionable superficial infection 11/10/2015   Postoperative wound infection 11/10/2015   Osteoarthritis of left hip 09/09/2015   Status post total replacement of left hip 09/09/2015   Obesity (BMI 35.0-39.9 without comorbidity) 04/28/2013    REFERRING PROVIDER: Gearldean Keepers MD  REFERRING DIAG: Reverse total shoulder  THERAPY DIAG:  Chronic right shoulder pain  Stiffness of right shoulder, not elsewhere classified  Rationale for Evaluation and Treatment: Rehabilitation  ONSET DATE: Ongoing.  Surgery (11/14/23).RT shldr  SUBJECTIVE:                                                                                                                                                                                      SUBJECTIVE STATEMENT: 4-5/10 right  shoulder pain today   PERTINENT HISTORY: Please see above.    PAIN:  Are you having  pain? No  PRECAUTIONS: Other: Per protocol.  No ultrasound.    FALLS:  Has patient fallen in last 6 months? Yes. Number of falls "Bad knees." Using cane.  LIVING ENVIRONMENT: Lives in: House/apartment Has following equipment at home: Single point cane  OCCUPATION: Disabled.  PLOF: Independent with basic ADLs and Independent with household mobility with device  PATIENT GOALS:Use right shoulder without pain.   NEXT MD VISIT:   OBJECTIVE:  Note: Objective measures were completed at Evaluation unless otherwise noted.   PATIENT SURVEYS:  Quick Dash 72.72   UPPER EXTREMITY ROM:   In supine:  Gentle right shoulder passive flexion to 90 degrees of flexion, ER 20 degrees and abduction to 60 degrees.  PALPATION:  Mild tenderness over proximal incisional site.  TREATMENT DATE:  01/27/24:  Pulleys x 5 minutes Seated UE Ranger x 5 minutes Gentle LLLDS x 13 minutes to patient's right shoulder into flexion, ER and abduction f/b  HMP and IFC at 80-150 Hz on 40% scan x    01-23-24 Pulleys   x 5 mins Seated UE ranger   x 6 mins  Seated  Manual: :  Gentle PROM/  AAROM x 15 minutes to patient's right shoulder into flexion, ER and abduction.   IFC 80-150hz  x 15 mins with HP to  RT shldr seated.   PATIENT EDUCATION: Education details:  Person educated:  International aid/development worker:  Education comprehension:   HOME EXERCISE PROGRAM:   ASSESSMENT:  CLINICAL IMPRESSION: Patient did very well with low load long duration stretching today, tolerating without complaint.     OBJECTIVE IMPAIRMENTS: decreased activity tolerance, decreased ROM, and pain.   ACTIVITY LIMITATIONS: carrying, lifting, and reach over head  PARTICIPATION LIMITATIONS: meal prep, cleaning, and laundry  PERSONAL FACTORS: Time since onset of injury/illness/exacerbation are also affecting  patient's functional outcome.   REHAB POTENTIAL: Good  CLINICAL DECISION MAKING: Stable/uncomplicated  EVALUATION COMPLEXITY: Low   GOALS:   SHORT TERM GOALS: Target date: 01/29/24  Ind with an initial HEP. Goal status: INITIAL   LONG TERM GOALS: Target date: 02/26/24  Ind with an advanced HEP.  Goal status: INITIAL  2.  Active right shoulder flexion to 125 degrees so the patient can easily reach overhead. Baseline:  Goal status: INITIAL  3.  Active ER to 60 degrees+ to allow for easily donning/doffing of apparel. Goal status: INITIAL  4.  Increase right shoulder strength to a solid 4/5 to increase stability for performance of functional activities.  Goal status: INITIAL  5.  Perform ADL's with pain not > 3/10.  Goal status: INITIAL  PLAN:  PT FREQUENCY: 2x/week  PT DURATION: 6 weeks  PLANNED INTERVENTIONS: 97110-Therapeutic exercises, 97530- Therapeutic activity, [id number]- Neuromuscular re-education, 97535- Self Care, 16109- Manual therapy, G0283- Electrical stimulation (unattended), 97016- Vasopneumatic device, Cryotherapy, and Moist heat  PLAN FOR NEXT SESSION: Progress per protocol.     Kelina Beauchamp, [place], PT 01/27/2024, 2:58 PM

## 2024-01-29 ENCOUNTER — Ambulatory Visit

## 2024-01-29 DIAGNOSIS — M25511 Pain in right shoulder: Secondary | ICD-10-CM | POA: Diagnosis not present

## 2024-01-29 DIAGNOSIS — G8929 Other chronic pain: Secondary | ICD-10-CM

## 2024-01-29 DIAGNOSIS — M25611 Stiffness of right shoulder, not elsewhere classified: Secondary | ICD-10-CM

## 2024-01-29 NOTE — Therapy (Signed)
 OUTPATIENT PHYSICAL THERAPY SHOULDER TREATMENT   Patient Name: Marilyn [name] MRN: [id number] DOB:[birth year], 53 y.o., female Today's Date: 01/29/2024  END OF SESSION:  PT End of Session - 01/29/24 1439     Visit Number 4    Number of Visits 12    Date for PT Re-Evaluation 02/26/24    Authorization Type FOTO AT LEAST EVERY 5TH VISIT.  PROGRESS NOTE AT 10TH VISIT.  KX MODIFIER AFTER 15 VISITS.    PT Start Time 1434    PT Stop Time 1537    PT Time Calculation (min) 63 min    Activity Tolerance Patient tolerated treatment well    Behavior During Therapy WFL for tasks assessed/performed              Past Medical History:  Diagnosis Date   Anemia    taking iron  now. pt states having no current issues 09/02/2015   Anginal pain (HCC)    pt states experiences chest wall pain pt states related to her asthma    Anxiety    with MRI's   Arthritis    Everywhere   Asthma    Depression    Dizziness    GERD (gastroesophageal reflux disease)    Headache(784.0)    HX OF MIGRAINES   History of bronchitis    Hypertension    Hypothyroidism    takes levothyroxen   OSA on CPAP    wears cpap   Shortness of breath    with exertion   Spinal cord stimulator status    Wears glasses    Past Surgical History:  Procedure Laterality Date   APPLICATION OF WOUND VAC  11/16/2021   Procedure: APPLICATION OF WOUND VAC;  Surgeon: Jasmine Mesi, MD;  Location: [facility] OR;  Service: Orthopedics;;   APPLICATION OF WOUND VAC Left 12/01/2021   Procedure: APPLICATION OF WOUND VAC;  Surgeon: Jasmine Mesi, MD;  [facility];  Service: Orthopedics;  Laterality: Left;   BACK SURGERY     CESAREAN SECTION     times 2   CHOLECYSTECTOMY     COLONOSCOPY  2020   ENDOMETRIAL ABLATION     I & D EXTREMITY Left 12/06/2021   Procedure: LEFT SHOULDER DEBRIDEMENT AND WOUND CLOSURE;  Surgeon: Timothy Ford, MD;  [facility] OR;  Service: Orthopedics;  Laterality: Left;   I & D EXTREMITY Left  12/20/2021   Procedure: LEFT SHOULDER DEBRIDEMENT;  Surgeon: Timothy Ford, MD;  Location: [facility] OR;  Service: Orthopedics;  Laterality: Left;   I & D EXTREMITY Left 12/22/2021   Procedure: LEFT SHOULDER DEBRIDEMENT;  Surgeon: Timothy Ford, MD;  Location: [facility] OR;  Service: Orthopedics;  Laterality: Left;   INCISION AND DRAINAGE Left 12/01/2021   Procedure: LEFT SHOULDER IRRIGATION AND EXCISIONAL DEBRIDEMENT;  Surgeon: Jasmine Mesi, MD;  Location: [facility] OR;  Service: Orthopedics;  Laterality: Left;   INCISION AND DRAINAGE HIP Left 11/10/2015   Procedure: IRRIGATION AND DEBRIDEMENT LEFT HIP INCISION;  Surgeon: Arnie Lao, MD;  [facility] OR;  Service: Orthopedics;  Laterality: Left;   JOINT REPLACEMENT  2011   total left knee   KNEE ARTHROPLASTY  04/23/2012   right    KNEE ARTHROSCOPY     LUMBAR LAMINECTOMY/DECOMPRESSION MICRODISCECTOMY N/A 06/23/2019   Procedure: RIGHT LUMBAR FIVE THROUGH SACRAL ONE PARTIAL HEMILAMINECTOMY WITH RIGHT LUMBAR FIVE FORAMINOTOMY;  Surgeon: Alphonso Jean, MD;  [facility] OR;  Service: Orthopedics;  Laterality: N/A;   LUMBAR WOUND DEBRIDEMENT N/A  06/11/2018   Procedure: LUMBAR WOUND DEBRIDEMENT;  Surgeon: Alphonso Jean, MD;  Location: [facility] OR;  Service: Orthopedics;  Laterality: N/A;   RADIOLOGY WITH ANESTHESIA N/A 09/09/2018   Procedure: LUMBER SPINE WITHOUT CONTRAST;  Surgeon: Radiologist, Medication, MD;  Location: [facility] OR;  Service: Radiology;  Laterality: N/A;   REVERSE SHOULDER ARTHROPLASTY Left 11/16/2021   Procedure: LEFT REVERSE SHOULDER ARTHROPLASTY;  Surgeon: Jasmine Mesi, MD;  Location: [facility] OR;  Service: Orthopedics;  Laterality: Left;   REVERSE SHOULDER ARTHROPLASTY Right 11/14/2023   Procedure: RIGHT REVERSE SHOULDER ARTHROPLASTY;  Surgeon: Jasmine Mesi, MD;  Location: [facility] OR;  Service: Orthopedics;  Laterality: Right;   ROTATOR CUFF REPAIR     left    SHOULDER SURGERY     right to repair ligament tear   SPINAL CORD  STIMULATOR IMPLANT  08/02/2020   [facility]   TOTAL HIP ARTHROPLASTY Left 09/09/2015   Procedure: LEFT TOTAL HIP ARTHROPLASTY ANTERIOR APPROACH;  Surgeon: Arnie Lao, MD;  Location: [facility] OR[facility];  Service: Orthopedics;  Laterality: Left;   TOTAL KNEE ARTHROPLASTY  04/23/2012   Procedure: TOTAL KNEE ARTHROPLASTY;  Surgeon: Timothy Ford, MD;  [facility]: [facility] OR;  Service: Orthopedics;  Laterality: Right;  Right Total Knee Arthroplasty   TUBAL LIGATION  1996   Patient Active Problem List   Diagnosis Date Noted   Arthritis of right shoulder region 11/21/2023   OA (osteoarthritis) of shoulder 11/14/2023   S/P reverse total shoulder arthroplasty, right 11/14/2023   AKI (acute kidney injury) (HCC) 10/12/2023   Acute urinary retention 10/12/2023   Acute renal failure superimposed on stage 3a chronic kidney disease (HCC) 03/31/2023   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   Acute metabolic encephalopathy 03/31/2023   Lactic acidosis 12/18/2021   Dyspnea on exertion 12/18/2021   Anemia 12/18/2021   Gout 12/18/2021   Acute pain of left shoulder    Arthritis of left shoulder region    S/P reverse total shoulder arthroplasty, left 11/16/2021   Unilateral primary osteoarthritis, right hip 05/09/2021   S/P lumbar spinal fusion 05/09/2021   S/P insertion of spinal cord stimulator 05/09/2021   H/O total knee replacement, bilateral 01/18/2021   Lumbar post-laminectomy syndrome 04/06/2020   Long-term current use of opiate analgesic 11/04/2019   Leg pain, right 10/20/2019   Dysesthesia 10/20/2019   Lateral femoral cutaneous neuropathy, 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 difficile colitis 05/22/2019   Hypophosphatemia 05/21/2019   Syncope 05/20/2019   OSA on CPAP    Acute gastroenteritis    Anemia due to blood loss, acute 06/18/2018    Class: Acute   Plantar fasciitis of left foot 06/16/2018    Class: Chronic    Tendonitis, Achilles, right 06/16/2018    Class: Chronic   Osteochondral talar dome lesion 06/16/2018    Class: Chronic   Deep incisional surgical site infection    Superficial incisional surgical site infection 06/11/2018    Class: Acute   Right ankle effusion 06/11/2018    Class: Acute   Morbid (severe) obesity due to excess calories (HCC) 05/29/2018    Class: Chronic   Spondylolisthesis, lumbar region 05/27/2018    Class: Chronic   Spinal stenosis of lumbar region 05/27/2018    Class: Chronic   Urinary tract infection 05/27/2018    Class: Acute   Fusion of spine of lumbar region 05/27/2018   Pain of right hip joint 07/03/2017   Back pain 03/27/2017  Chronic right shoulder pain 02/13/2017   History of rotator cuff tear 11/30/2016   Impingement syndrome of right shoulder 11/30/2016   Exacerbation of asthma 07/18/2016   Acute hypokalemia 07/18/2016   Hypertension 07/18/2016   Depression with anxiety 07/18/2016   Hypothyroidism 07/18/2016   Hyperglycemia 07/18/2016   Asthma, chronic 07/18/2016   Surgical wound dehiscence left hip; questionable superficial infection 11/10/2015   Postoperative wound infection 11/10/2015   Osteoarthritis of left hip 09/09/2015   Status post total replacement of left hip 09/09/2015   Obesity (BMI 35.0-39.9 without comorbidity) 04/28/2013    REFERRING PROVIDER: Gearldean Keepers MD  REFERRING DIAG: Reverse total shoulder  THERAPY DIAG:  Chronic right shoulder pain  Stiffness of right shoulder, not elsewhere classified  Rationale for Evaluation and Treatment: Rehabilitation  ONSET DATE: Ongoing.  Surgery (11/14/23).RT shldr  SUBJECTIVE:                                                                                                                                                                                      SUBJECTIVE STATEMENT: Pt reports 5/10 right shoulder pain today.   PERTINENT HISTORY: Please see above.    PAIN:  Are you  having pain? Yes: NPRS scale: 5/10 Pain location: right shoulder  PRECAUTIONS: Other: Per protocol.  No ultrasound.    FALLS:  Has patient fallen in last 6 months? Yes. Number of falls "Bad knees." Using cane.  LIVING ENVIRONMENT: Lives in: House/apartment Has following equipment at home: Single point cane  OCCUPATION: Disabled.  PLOF: Independent with basic ADLs and Independent with household mobility with device  PATIENT GOALS:Use right shoulder without pain.   NEXT MD VISIT:   OBJECTIVE:  Note: Objective measures were completed at Evaluation unless otherwise noted.   PATIENT SURVEYS:  Quick Dash 72.72   UPPER EXTREMITY ROM:   In supine:  Gentle right shoulder passive flexion to 90 degrees of flexion, ER 20 degrees and abduction to 60 degrees.  PALPATION:  Mild tenderness over proximal incisional site.  TREATMENT DATE:   01/29/24                                 EXERCISE LOG  Exercise Repetitions and Resistance Comments  Pulleys 5 mins   UE Ranger Flex/ext; CW and CCW circles x 2 mins each   Wall Ladder 5 reps (max # 18)   AA Flexion Cane 2 sets of 10 reps   AA Chest Press Cane 2 sets of 10 reps    Blank cell = exercise not performed today   Manual Therapy Passive ROM: PROM/AAROM right shoulder, flexion, ER, abduction to increase ROM and function   Modalities  Date:  Unattended Estim: Shoulder, IFC 80-150 Hz, 15 mins, Pain and Tone Hot Pack: Shoulder, 15 mins, Pain and Tone   01/27/24:  Pulleys x 5 minutes Seated UE Ranger x 5 minutes Gentle LLLDS x 13 minutes to patient's right shoulder into flexion, ER and abduction f/b  HMP and IFC at 80-150 Hz on 40% scan x    01-23-24 Pulleys   x 5 mins Seated UE ranger   x 6 mins  Seated  Manual: :  Gentle PROM/  AAROM x 15 minutes to patient's right shoulder into flexion, ER and abduction.    IFC 80-150hz  x 15 mins with HP to  RT shldr seated.   PATIENT EDUCATION: Education details:  Person educated:  International aid/development worker:  Education comprehension:   HOME EXERCISE PROGRAM:   ASSESSMENT:  CLINICAL IMPRESSION: Pt arrives for today's treatment session reporting 5/10 right shoulder pain.  Pt able to tolerate increased time with UE ranger today working on increased ROM.  Pt introduced to wall ladder with max number reached of 18 today.  Pt also introduced to seated AA flexion and chest press with use of cane.  PROM/AAROM performed to right shoulder with gentle hold at end range to tolerance.  Normal responses to estim and MH noted upon removal.  Pt reported decreased pain at completion of today's treatment session.   OBJECTIVE IMPAIRMENTS: decreased activity tolerance, decreased ROM, and pain.   ACTIVITY LIMITATIONS: carrying, lifting, and reach over head  PARTICIPATION LIMITATIONS: meal prep, cleaning, and laundry  PERSONAL FACTORS: Time since onset of injury/illness/exacerbation are also affecting patient's functional outcome.   REHAB POTENTIAL: Good  CLINICAL DECISION MAKING: Stable/uncomplicated  EVALUATION COMPLEXITY: Low   GOALS:   SHORT TERM GOALS: Target date: 01/29/24  Ind with an initial HEP. Goal status: INITIAL   LONG TERM GOALS: Target date: 02/26/24  Ind with an advanced HEP.  Goal status: INITIAL  2.  Active right shoulder flexion to 125 degrees so the patient can easily reach overhead. Baseline:  Goal status: INITIAL  3.  Active ER to 60 degrees+ to allow for easily donning/doffing of apparel. Goal status: INITIAL  4.  Increase right shoulder strength to a solid 4/5 to increase stability for performance of functional activities.  Goal status: INITIAL  5.  Perform ADL's with pain not > 3/10.  Goal status: INITIAL  PLAN:  PT FREQUENCY: 2x/week  PT DURATION: 6 weeks  PLANNED INTERVENTIONS: 97110-Therapeutic exercises, 97530- Therapeutic  activity, [id number]- Neuromuscular re-education, 97535- Self Care, 82956- Manual therapy, [id number]- Electrical stimulation (unattended), 97016- Vasopneumatic device, Cryotherapy, and Moist heat  PLAN FOR NEXT SESSION: Progress per protocol.     Deryl Flora, PTA 01/29/2024, 3:40 PM

## 2024-02-03 ENCOUNTER — Ambulatory Visit

## 2024-02-05 ENCOUNTER — Ambulatory Visit (INDEPENDENT_AMBULATORY_CARE_PROVIDER_SITE_OTHER): Admitting: Orthopedic Surgery

## 2024-02-05 DIAGNOSIS — Z96611 Presence of right artificial shoulder joint: Secondary | ICD-10-CM

## 2024-02-06 ENCOUNTER — Encounter: Admitting: *Deleted

## 2024-02-06 ENCOUNTER — Encounter: Payer: Self-pay | Admitting: Orthopedic Surgery

## 2024-02-06 NOTE — Progress Notes (Signed)
 Post-Op Visit Note   Patient: Marilyn [name]           Date of Birth: 27-Mar-1971           MRN: [id number] Visit Date: 02/05/2024 PCP: Fonda Hymen, NP   Assessment & Plan:  Chief Complaint:  Chief Complaint  Patient presents with   Right Shoulder - Follow-up    11/14/23 right RSA   Visit Diagnoses:  1. S/P reverse total shoulder arthroplasty, right     Plan: That was now 3 months out right reverse shoulder replacement.  She is seeing Dr. Julio Ohm for a small little wound problem at the distal aspect of the incision but that has healed up with no issues.  She is only had therapy for 3 visits.  On exam she has improving shoulder forward flexion and abduction.  External rotation is to about 25 degrees.  Plan is to continue physical therapy with range of motion and strengthening 1 time a week for the next 8 weeks and final recheck in 8 weeks  Follow-Up Instructions: Return in about 2 months (around 04/06/2024).   Orders:  Orders Placed This Encounter  Procedures   Ambulatory referral to Physical Therapy   No orders of the defined types were placed in this encounter.   Imaging: No results found.  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   Acute metabolic encephalopathy 03/31/2023   Lactic acidosis 12/18/2021   Dyspnea on exertion 12/18/2021   Anemia 12/18/2021   Gout 12/18/2021   Acute pain of left shoulder    Arthritis of left shoulder region    S/P reverse total shoulder arthroplasty, left 11/16/2021   Unilateral primary osteoarthritis, right hip 05/09/2021   S/P lumbar spinal  fusion 05/09/2021   S/P insertion of spinal cord stimulator 05/09/2021   H/O total knee replacement, bilateral 01/18/2021   Lumbar post-laminectomy syndrome 04/06/2020   Long-term current use of opiate analgesic 11/04/2019   Leg pain, right 10/20/2019   Dysesthesia 10/20/2019   Lateral femoral cutaneous neuropathy, 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 difficile colitis 05/22/2019   Hypophosphatemia 05/21/2019   Syncope 05/20/2019   OSA on CPAP    Acute gastroenteritis    Anemia due to blood loss, acute 06/18/2018    Class: Acute   Plantar fasciitis of left foot 06/16/2018    Class: Chronic   Tendonitis, Achilles, right 06/16/2018    Class: Chronic   Osteochondral talar dome lesion 06/16/2018    Class: Chronic   Deep incisional surgical site infection    Superficial incisional surgical site infection 06/11/2018    Class: Acute   Right ankle effusion 06/11/2018    Class: Acute   Morbid (severe) obesity due to excess calories (HCC) 05/29/2018    Class: Chronic   Spondylolisthesis, lumbar region 05/27/2018    Class: Chronic   Spinal stenosis of lumbar region 05/27/2018    Class: Chronic   Urinary tract infection 05/27/2018    Class: Acute   Fusion of spine of lumbar region 05/27/2018   Pain of right  hip joint 07/03/2017   Back pain 03/27/2017   Chronic right shoulder pain 02/13/2017   History of rotator cuff tear 11/30/2016   Impingement syndrome of right shoulder 11/30/2016   Exacerbation of asthma 07/18/2016   Acute hypokalemia 07/18/2016   Hypertension 07/18/2016   Depression with anxiety 07/18/2016   Hypothyroidism 07/18/2016   Hyperglycemia 07/18/2016   Asthma, chronic 07/18/2016   Surgical wound dehiscence left hip; questionable superficial infection 11/10/2015   Postoperative wound infection 11/10/2015   Osteoarthritis of left hip 09/09/2015   Status post total replacement of  left hip 09/09/2015   Obesity (BMI 35.0-39.9 without comorbidity) 04/28/2013   Past Medical History:  Diagnosis Date   Anemia    taking iron  now. pt states having no current issues 09/02/2015   Anginal pain (HCC)    pt states experiences chest wall pain pt states related to her asthma    Anxiety    with MRI's   Arthritis    Everywhere   Asthma    Depression    Dizziness    GERD (gastroesophageal reflux disease)    Headache(784.0)    HX OF MIGRAINES   History of bronchitis    Hypertension    Hypothyroidism    takes levothyroxen   OSA on CPAP    wears cpap   Shortness of breath    with exertion   Spinal cord stimulator status    Wears glasses     Family History  Problem Relation Age of Onset   Cancer Mother        colon   Epilepsy Mother    Cancer Father        prostate   Diabetes Father    Hypertension Father    Hypertension Maternal Aunt    Diabetes Maternal Aunt    Hypertension Paternal Aunt     Past Surgical History:  Procedure Laterality Date   APPLICATION OF WOUND VAC  11/16/2021   Procedure: APPLICATION OF WOUND VAC;  Surgeon: Jasmine Mesi, MD;  Location: [facility] OR;  Service: Orthopedics;;   APPLICATION OF WOUND VAC Left 12/01/2021   Procedure: APPLICATION OF WOUND VAC;  Surgeon: Jasmine Mesi, MD;  Location: [facility] OR;  Service: Orthopedics;  Laterality: Left;   BACK SURGERY     CESAREAN SECTION     times 2   CHOLECYSTECTOMY     COLONOSCOPY  2020   ENDOMETRIAL ABLATION     I & D EXTREMITY Left 12/06/2021   Procedure: LEFT SHOULDER DEBRIDEMENT AND WOUND CLOSURE;  Surgeon: Timothy Ford, MD;  Location: [facility] OR;  Service: Orthopedics;  Laterality: Left;   I & D EXTREMITY Left 12/20/2021   Procedure: LEFT SHOULDER DEBRIDEMENT;  Surgeon: Timothy Ford, MD;  Location: [facility] OR;  Service: Orthopedics;  Laterality: Left;   I & D EXTREMITY Left 12/22/2021   Procedure: LEFT SHOULDER DEBRIDEMENT;  Surgeon: Timothy Ford, MD;  Location: [facility] OR;  Service:  Orthopedics;  Laterality: Left;   INCISION AND DRAINAGE Left 12/01/2021   Procedure: LEFT SHOULDER IRRIGATION AND EXCISIONAL DEBRIDEMENT;  Surgeon: Jasmine Mesi, MD;  Location: [facility] OR;  Service: Orthopedics;  Laterality: Left;   INCISION AND DRAINAGE HIP Left 11/10/2015   Procedure: IRRIGATION AND DEBRIDEMENT LEFT HIP INCISION;  Surgeon: Arnie Lao, MD;  Location: [facility] OR;  Service: Orthopedics;  Laterality: Left;   JOINT REPLACEMENT  2011   total left knee   KNEE ARTHROPLASTY  04/23/2012   right    KNEE ARTHROSCOPY  LUMBAR LAMINECTOMY/DECOMPRESSION MICRODISCECTOMY N/A 06/23/2019   Procedure: RIGHT LUMBAR FIVE THROUGH SACRAL ONE PARTIAL HEMILAMINECTOMY WITH RIGHT LUMBAR FIVE FORAMINOTOMY;  Surgeon: Alphonso Jean, MD;  Location: [facility] OR;  Service: Orthopedics;  Laterality: N/A;   LUMBAR WOUND DEBRIDEMENT N/A 06/11/2018   Procedure: LUMBAR WOUND DEBRIDEMENT;  Surgeon: Alphonso Jean, MD;  Location: [facility] OR;  Service: Orthopedics;  Laterality: N/A;   RADIOLOGY WITH ANESTHESIA N/A 09/09/2018   Procedure: LUMBER SPINE WITHOUT CONTRAST;  Surgeon: Radiologist, Medication, MD;  Location: [facility] OR;  Service: Radiology;  Laterality: N/A;   REVERSE SHOULDER ARTHROPLASTY Left 11/16/2021   Procedure: LEFT REVERSE SHOULDER ARTHROPLASTY;  Surgeon: Jasmine Mesi, MD;  Location: [facility] OR;  Service: Orthopedics;  Laterality: Left;   REVERSE SHOULDER ARTHROPLASTY Right 11/14/2023   Procedure: RIGHT REVERSE SHOULDER ARTHROPLASTY;  Surgeon: Jasmine Mesi, MD;  Location: [facility] OR;  Service: Orthopedics;  Laterality: Right;   ROTATOR CUFF REPAIR     left    SHOULDER SURGERY     right to repair ligament tear   SPINAL CORD STIMULATOR IMPLANT  08/02/2020   [facility]   TOTAL HIP ARTHROPLASTY Left 09/09/2015   Procedure: LEFT TOTAL HIP ARTHROPLASTY ANTERIOR APPROACH;  Surgeon: Arnie Lao, MD;  Location: [facility];  Service: Orthopedics;  Laterality: Left;   TOTAL KNEE ARTHROPLASTY  04/23/2012    Procedure: TOTAL KNEE ARTHROPLASTY;  Surgeon: Timothy Ford, MD;  Location: [facility] OR;  Service: Orthopedics;  Laterality: Right;  Right Total Knee Arthroplasty   TUBAL LIGATION  1996   Social History   Occupational History   Not on file  Tobacco Use   Smoking status: Former    Current packs/day: 0.00    Average packs/day: 0.5 packs/day for 4.0 years (2.0 ttl pk-yrs)    Types: Cigarettes    Start date: 09/18/1983    Quit date: 09/18/1987    Years since quitting: 36.4   Smokeless tobacco: Never  Vaping Use   Vaping status: Never Used  Substance and Sexual Activity   Alcohol  use: No   Drug use: No   Sexual activity: Yes    Birth control/protection: Surgical

## 2024-02-12 ENCOUNTER — Encounter: Payer: Self-pay | Admitting: Physical Therapy

## 2024-02-12 ENCOUNTER — Ambulatory Visit: Admitting: Physical Therapy

## 2024-02-12 DIAGNOSIS — M25611 Stiffness of right shoulder, not elsewhere classified: Secondary | ICD-10-CM

## 2024-02-12 DIAGNOSIS — M25511 Pain in right shoulder: Secondary | ICD-10-CM | POA: Diagnosis not present

## 2024-02-12 DIAGNOSIS — G8929 Other chronic pain: Secondary | ICD-10-CM

## 2024-02-12 NOTE — Therapy (Addendum)
 OUTPATIENT PHYSICAL THERAPY SHOULDER TREATMENT   Patient Name: Marilyn [name] MRN: [id number] DOB:1971-02-19, 53 y.o., female Today's Date: 02/12/2024  END OF SESSION:  PT End of Session - 02/12/24 1138     Visit Number 5    Number of Visits 12    Date for PT Re-Evaluation 02/26/24    Authorization Type FOTO AT LEAST EVERY 5TH VISIT.  PROGRESS NOTE AT 10TH VISIT.  KX MODIFIER AFTER 15 VISITS.    PT Start Time 1106    PT Stop Time 1158    PT Time Calculation (min) 52 min    Activity Tolerance Patient tolerated treatment well    Behavior During Therapy WFL for tasks assessed/performed               Past Medical History:  Diagnosis Date   Anemia    taking iron  now. pt states having no current issues 09/02/2015   Anginal pain (HCC)    pt states experiences chest wall pain pt states related to her asthma    Anxiety    with MRI's   Arthritis    Everywhere   Asthma    Depression    Dizziness    GERD (gastroesophageal reflux disease)    Headache(784.0)    HX OF MIGRAINES   History of bronchitis    Hypertension    Hypothyroidism    takes levothyroxen   OSA on CPAP    wears cpap   Shortness of breath    with exertion   Spinal cord stimulator status    Wears glasses    Past Surgical History:  Procedure Laterality Date   APPLICATION OF WOUND VAC  11/16/2021   Procedure: APPLICATION OF WOUND VAC;  Surgeon: Addie Cordella Hamilton, MD;  Location: [facility] OR;  Service: Orthopedics;;   APPLICATION OF WOUND VAC Left 12/01/2021   Procedure: APPLICATION OF WOUND VAC;  Surgeon: Addie Cordella Hamilton, MD;  Location: [facility] OR;  Service: Orthopedics;  Laterality: Left;   BACK SURGERY     CESAREAN SECTION     times 2   CHOLECYSTECTOMY     COLONOSCOPY  2020   ENDOMETRIAL ABLATION     I & D EXTREMITY Left 12/06/2021   Procedure: LEFT SHOULDER DEBRIDEMENT AND WOUND CLOSURE;  Surgeon: Harden Jerona GAILS, MD;  Location: [facility] OR;  Service: Orthopedics;  Laterality: Left;   I & D EXTREMITY Left  12/20/2021   Procedure: LEFT SHOULDER DEBRIDEMENT;  Surgeon: Harden Jerona GAILS, MD;  Location: [facility] OR;  Service: Orthopedics;  Laterality: Left;   I & D EXTREMITY Left 12/22/2021   Procedure: LEFT SHOULDER DEBRIDEMENT;  Surgeon: Harden Jerona GAILS, MD;  Location: [facility] OR;  Service: Orthopedics;  Laterality: Left;   INCISION AND DRAINAGE Left 12/01/2021   Procedure: LEFT SHOULDER IRRIGATION AND EXCISIONAL DEBRIDEMENT;  Surgeon: Addie Cordella Hamilton, MD;  Location: [facility] OR;  Service: Orthopedics;  Laterality: Left;   INCISION AND DRAINAGE HIP Left 11/10/2015   Procedure: IRRIGATION AND DEBRIDEMENT LEFT HIP INCISION;  Surgeon: Lonni CINDERELLA Poli, MD;  Location: [facility] OR;  Service: Orthopedics;  Laterality: Left;   JOINT REPLACEMENT  2011   total left knee   KNEE ARTHROPLASTY  04/23/2012   right    KNEE ARTHROSCOPY     LUMBAR LAMINECTOMY/DECOMPRESSION MICRODISCECTOMY N/A 06/23/2019   Procedure: RIGHT LUMBAR FIVE THROUGH SACRAL ONE PARTIAL HEMILAMINECTOMY WITH RIGHT LUMBAR FIVE FORAMINOTOMY;  Surgeon: Lucilla Lynwood BRAVO, MD;  Location: [facility] OR;  Service: Orthopedics;  Laterality: N/A;   LUMBAR WOUND DEBRIDEMENT  N/A 06/11/2018   Procedure: LUMBAR WOUND DEBRIDEMENT;  Surgeon: Lucilla Lynwood BRAVO, MD;  Location: [facility] OR;  Service: Orthopedics;  Laterality: N/A;   RADIOLOGY WITH ANESTHESIA N/A 09/09/2018   Procedure: LUMBER SPINE WITHOUT CONTRAST;  Surgeon: Radiologist, Medication, MD;  Location: [facility] OR;  Service: Radiology;  Laterality: N/A;   REVERSE SHOULDER ARTHROPLASTY Left 11/16/2021   Procedure: LEFT REVERSE SHOULDER ARTHROPLASTY;  Surgeon: Addie Cordella Hamilton, MD;  Location: [facility] OR;  Service: Orthopedics;  Laterality: Left;   REVERSE SHOULDER ARTHROPLASTY Right 11/14/2023   Procedure: RIGHT REVERSE SHOULDER ARTHROPLASTY;  Surgeon: Addie Cordella Hamilton, MD;  Location: [facility] OR;  Service: Orthopedics;  Laterality: Right;   ROTATOR CUFF REPAIR     left    SHOULDER SURGERY     right to repair ligament tear   SPINAL CORD  STIMULATOR IMPLANT  08/02/2020   [facility]   TOTAL HIP ARTHROPLASTY Left 09/09/2015   Procedure: LEFT TOTAL HIP ARTHROPLASTY ANTERIOR APPROACH;  Surgeon: Lonni CINDERELLA Poli, MD;  Location: [facility] ORS;  Service: Orthopedics;  Laterality: Left;   TOTAL KNEE ARTHROPLASTY  04/23/2012   Procedure: TOTAL KNEE ARTHROPLASTY;  Surgeon: Jerona LULLA Sage, MD;  Location: [facility] OR;  Service: Orthopedics;  Laterality: Right;  Right Total Knee Arthroplasty   TUBAL LIGATION  1996   Patient Active Problem List   Diagnosis Date Noted   Arthritis of right shoulder region 11/21/2023   OA (osteoarthritis) of shoulder 11/14/2023   S/P reverse total shoulder arthroplasty, right 11/14/2023   AKI (acute kidney injury) (HCC) 10/12/2023   Acute urinary retention 10/12/2023   Acute renal failure superimposed on stage 3a chronic kidney disease (HCC) 03/31/2023   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   Acute metabolic encephalopathy 03/31/2023   Lactic acidosis 12/18/2021   Dyspnea on exertion 12/18/2021   Anemia 12/18/2021   Gout 12/18/2021   Acute pain of left shoulder    Arthritis of left shoulder region    S/P reverse total shoulder arthroplasty, left 11/16/2021   Unilateral primary osteoarthritis, right hip 05/09/2021   S/P lumbar spinal fusion 05/09/2021   S/P insertion of spinal cord stimulator 05/09/2021   H/O total knee replacement, bilateral 01/18/2021   Lumbar post-laminectomy syndrome 04/06/2020   Long-term current use of opiate analgesic 11/04/2019   Leg pain, right 10/20/2019   Dysesthesia 10/20/2019   Lateral femoral cutaneous neuropathy, 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 difficile colitis 05/22/2019   Hypophosphatemia 05/21/2019   Syncope 05/20/2019   OSA on CPAP    Acute gastroenteritis    Anemia due to blood loss, acute 06/18/2018    Class: Acute   Plantar fasciitis of left foot 06/16/2018    Class: Chronic    Tendonitis, Achilles, right 06/16/2018    Class: Chronic   Osteochondral talar dome lesion 06/16/2018    Class: Chronic   Deep incisional surgical site infection    Superficial incisional surgical site infection 06/11/2018    Class: Acute   Right ankle effusion 06/11/2018    Class: Acute   Morbid (severe) obesity due to excess calories (HCC) 05/29/2018    Class: Chronic   Spondylolisthesis, lumbar region 05/27/2018    Class: Chronic   Spinal stenosis of lumbar region 05/27/2018    Class: Chronic   Urinary tract infection 05/27/2018    Class: Acute   Fusion of spine of lumbar region 05/27/2018   Pain of right hip joint 07/03/2017   Back pain  03/27/2017   Chronic right shoulder pain 02/13/2017   History of rotator cuff tear 11/30/2016   Impingement syndrome of right shoulder 11/30/2016   Exacerbation of asthma 07/18/2016   Acute hypokalemia 07/18/2016   Hypertension 07/18/2016   Depression with anxiety 07/18/2016   Hypothyroidism 07/18/2016   Hyperglycemia 07/18/2016   Asthma, chronic 07/18/2016   Surgical wound dehiscence left hip; questionable superficial infection 11/10/2015   Postoperative wound infection 11/10/2015   Osteoarthritis of left hip 09/09/2015   Status post total replacement of left hip 09/09/2015   Obesity (BMI 35.0-39.9 without comorbidity) 04/28/2013    REFERRING PROVIDER: Jerona Sage MD  REFERRING DIAG: Reverse total shoulder  THERAPY DIAG:  Chronic right shoulder pain  Stiffness of right shoulder, not elsewhere classified  Rationale for Evaluation and Treatment: Rehabilitation  ONSET DATE: Ongoing.  Surgery (11/14/23).RT shldr  SUBJECTIVE:                                                                                                                                                                                      SUBJECTIVE STATEMENT: Pt reports 5/10 right shoulder pain today. A lot of LBP today.  PERTINENT HISTORY: Please see above.     PAIN:  Are you having pain? Yes: NPRS scale: 5/10 Pain location: right shoulder  PRECAUTIONS: Other: Per protocol.  No ultrasound.    FALLS:  Has patient fallen in last 6 months? Yes. Number of falls Bad knees. Using cane.  LIVING ENVIRONMENT: Lives in: House/apartment Has following equipment at home: Single point cane  OCCUPATION: Disabled.  PLOF: Independent with basic ADLs and Independent with household mobility with device  PATIENT GOALS:Use right shoulder without pain.   NEXT MD VISIT:   OBJECTIVE:  Note: Objective measures were completed at Evaluation unless otherwise noted.   PATIENT SURVEYS:  Quick Dash 72.72   UPPER EXTREMITY ROM:   In supine:  Gentle right shoulder passive flexion to 90 degrees of flexion, ER 20 degrees and abduction to 60 degrees.  PALPATION:  Mild tenderness over proximal incisional site.  TREATMENT DATE:  02/12/24:  Pulleys x 5 minutes f/b seated ranger x 6 minutes f/b right shoulder PROM x 12 minutes f/b  HMP and IFC at 80-150 Hz on 40% scan x     01/29/24                                 EXERCISE LOG  Exercise Repetitions and Resistance Comments  Pulleys 5 mins   UE Ranger Flex/ext; CW and CCW circles x 2 mins each   Wall Ladder 5 reps (max # 18)   AA Flexion Cane 2 sets of 10 reps   AA Chest Press Cane 2 sets of 10 reps    Blank cell = exercise not performed today   Manual Therapy Passive ROM: PROM/AAROM right shoulder, flexion, ER, abduction to increase ROM and function   Modalities  Date:  Unattended Estim: Shoulder, IFC 80-150 Hz, 15 mins, Pain and Tone Hot Pack: Shoulder, 15 mins, Pain and Tone   01/27/24:  Pulleys x 5 minutes Seated UE Ranger x 5 minutes Gentle LLLDS x 13 minutes to patient's right shoulder into flexion, ER and abduction f/b  HMP and IFC at 80-150 Hz on 40% scan x     01-23-24 Pulleys   x 5 mins Seated UE ranger   x 6 mins  Seated  Manual: :  Gentle PROM/  AAROM x 15 minutes to patient's right shoulder into flexion, ER and abduction.   IFC 80-150hz  x 15 mins with HP to  RT shldr seated.   PATIENT EDUCATION: Education details:  Person educated:  International aid/development worker:  Education comprehension:   HOME EXERCISE PROGRAM:   ASSESSMENT:  CLINICAL IMPRESSION: Patient had returned to MD and was pleased wit her progress and decreased PT frequency to one time a week.  She did well with PT today.  No wall ladder today due to a lot of low back pain.    OBJECTIVE IMPAIRMENTS: decreased activity tolerance, decreased ROM, and pain.   ACTIVITY LIMITATIONS: carrying, lifting, and reach over head  PARTICIPATION LIMITATIONS: meal prep, cleaning, and laundry  PERSONAL FACTORS: Time since onset of injury/illness/exacerbation are also affecting patient's functional outcome.   REHAB POTENTIAL: Good  CLINICAL DECISION MAKING: Stable/uncomplicated  EVALUATION COMPLEXITY: Low   GOALS:   SHORT TERM GOALS: Target date: 01/29/24  Ind with an initial HEP. Goal status: INITIAL   LONG TERM GOALS: Target date: 02/26/24  Ind with an advanced HEP.  Goal status: INITIAL  2.  Active right shoulder flexion to 125 degrees so the patient can easily reach overhead. Baseline:  Goal status: INITIAL  3.  Active ER to 60 degrees+ to allow for easily donning/doffing of apparel. Goal status: INITIAL  4.  Increase right shoulder strength to a solid 4/5 to increase stability for performance of functional activities.  Goal status: INITIAL  5.  Perform ADL's with pain not > 3/10.  Goal status: INITIAL  PLAN:  PT FREQUENCY: 2x/week  PT DURATION: 6 weeks  PLANNED INTERVENTIONS: 97110-Therapeutic exercises, 97530- Therapeutic activity, [id number]- Neuromuscular re-education, 97535- Self Care, [id number]- Manual therapy, [id number]- Electrical stimulation (unattended), [id number]-  Vasopneumatic device, Cryotherapy, and Moist heat  PLAN FOR NEXT SESSION: Progress per protocol.     Rogers Ditter, [place], PT 02/12/2024, 12:01 PM   PHYSICAL THERAPY DISCHARGE SUMMARY  Visits from Start of Care: 5.  Current functional level related to goals / functional outcomes: See above.   Remaining deficits: See  below.   Education / Equipment: HEP.   Patient agrees to discharge. Patient goals were not met. Patient is being discharged due to not returning since the last visit.    Donta Fuster MPT

## 2024-02-13 ENCOUNTER — Encounter: Admitting: *Deleted

## 2024-02-17 ENCOUNTER — Encounter: Payer: Self-pay | Admitting: Orthopedic Surgery

## 2024-02-17 ENCOUNTER — Ambulatory Visit (INDEPENDENT_AMBULATORY_CARE_PROVIDER_SITE_OTHER): Admitting: Orthopedic Surgery

## 2024-02-17 DIAGNOSIS — M5416 Radiculopathy, lumbar region: Secondary | ICD-10-CM

## 2024-02-17 NOTE — Progress Notes (Signed)
 Orthopedic Spine Surgery Office Note  Assessment: Patient is a 53 y.o. 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  I talked about her options at this point.  Since she is in pain management, I do not think that there are a lot of medication options for her.  I talked about injection as an additional nonoperative treatment.  She said if that did not work what would her options be.  I told her that surgery could be done for her symptoms but she is not currently a candidate given her weight and the increased risk of infection associated with surgery in those with BMI greater than 40.  I encouraged weight loss.  After explaining the rationale for optimization of the patient's BMI prior to elective spine surgery, the patient's daughter got upset and said that she had never heard anything like that before.  She is also had multiple other orthopedist to have operated on her including several of my partners.  She did not understand why I am the only one that is telling them that she needs to lose weight prior to elective surgery.  I explained that every surgeon has different criteria and opinions about optimization prior to elective surgeries.  The patient and the daughter were not happy with this restriction and explanation.  They wanted a second opinion and so they would find a different spine surgeon in town -Patient can follow up on an as needed basis   Patient expressed understanding of the plan and all questions were answered to the patient's satisfaction.   ___________________________________________________________________________  History: Patient is a 53 y.o. female who has been previously seen in the office for low back pain.  She comes in today with low back pain and bilateral lateral thigh pain.  She has the pain  with activity and at rest.  She feels the pain over her whole lumbar spine.  She is in pain management and feels that that is not adequately covering her pain.  She has not noticed any weakness in her lower extremities.  She is ambulating with a cane.  She has a history of urinary and fecal incontinence.  She has not noticed any recent changes in her bowel and bladder habits.  No saddle anesthesia.  No pain radiating past the knee on either side.   Physical Exam:  General: no acute distress, appears stated age Neurologic: alert, answering questions appropriately, following commands Respiratory: unlabored breathing on room air, symmetric chest rise Psychiatric: appropriate affect, normal cadence to speech   MSK (spine):  -Strength exam      Left  Right EHL    5/5  5/5 TA    5/5  5/5 GSC    5/5  5/5 Knee extension  5/5  5/5 Hip flexion   5/5  5/5  -Sensory exam    Sensation intact to light touch in L2-S1 nerve distributions of bilateral lower extremities  -Seated straight leg raise: negative bilaterally -Clonus: no beats bilaterally  Imaging: None obtained at today's visit   Patient name: Marilyn [name] Patient MRN: [id number] Date of visit: 02/17/24

## 2024-02-19 ENCOUNTER — Encounter

## 2024-02-21 ENCOUNTER — Encounter: Admitting: Physical Therapy

## 2024-02-24 ENCOUNTER — Other Ambulatory Visit (HOSPITAL_COMMUNITY): Payer: Self-pay

## 2024-02-24 DIAGNOSIS — M810 Age-related osteoporosis without current pathological fracture: Secondary | ICD-10-CM

## 2024-02-26 ENCOUNTER — Encounter

## 2024-02-27 ENCOUNTER — Ambulatory Visit: Admitting: Orthopedic Surgery

## 2024-03-16 ENCOUNTER — Other Ambulatory Visit (HOSPITAL_COMMUNITY)

## 2024-03-16 ENCOUNTER — Encounter (HOSPITAL_COMMUNITY): Payer: Self-pay

## 2024-03-18 ENCOUNTER — Telehealth: Payer: Self-pay | Admitting: Orthopedic Surgery

## 2024-03-18 NOTE — Telephone Encounter (Signed)
 Received vm from patient, IC, spoke with patient, she needs copy of her medical records by Friday. She will come by office to complete auth for records and I will prepare for her once I receive auth.

## 2024-04-06 ENCOUNTER — Encounter: Admitting: Surgical

## 2024-04-20 ENCOUNTER — Encounter: Admitting: Surgical

## 2024-04-24 ENCOUNTER — Encounter: Admitting: Surgical

## 2024-05-04 ENCOUNTER — Ambulatory Visit (INDEPENDENT_AMBULATORY_CARE_PROVIDER_SITE_OTHER): Admitting: Surgical

## 2024-05-04 ENCOUNTER — Encounter: Payer: Self-pay | Admitting: Surgical

## 2024-05-04 DIAGNOSIS — Z96611 Presence of right artificial shoulder joint: Secondary | ICD-10-CM

## 2024-05-04 NOTE — Progress Notes (Signed)
 Post-Op Visit Note   Patient: Marilyn [name]           Date of Birth: 05-16-1971           MRN: [id number] Visit Date: 05/04/2024 PCP: Baird Comer GAILS, NP   Assessment & Plan:  Chief Complaint:  Chief Complaint  Patient presents with   Right Shoulder - Follow-up    11/14/2023 Right RSA   Visit Diagnoses:  1. S/P reverse total shoulder arthroplasty, right     Plan: Marilyn [name] is a 53 y.o. female who presents s/p right reverse shoulder arthroplasty on 11/14/2023.  Patient is doing well and pain is overall controlled.  She feels she is doing very well and her right shoulder is very functional for her.  She is ambulating with a cane and able to bear weight through the cane with her right arm with minimal discomfort.  Only gets an occasional twinge every so often but nothing that requires any pain medication.  [facility] which she is looking forward to.  On exam, patient has 30 degrees X rotation, 90 degrees abduction, 160 degrees forward elevation both passively and actively.  Incision is well-healed.  No sinus tract.  No cellulitis or skin changes.  Axillary nerve is intact with deltoid firing.  Intact subscapularis strength.  Intact supination and bicep flexion strength with no noticeable Popeye deformity.  Plan at this time is continue with physical therapy.  She will follow-up with the office as needed.  Did caution her against any lifting more than 25 pounds.  Call with any concerns.  Will instructions: No follow-ups on file.   Orders:  No orders of the defined types were placed in this encounter.  No orders of the defined types were placed in this encounter.   Imaging: No results found.  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   Acute metabolic encephalopathy 03/31/2023   Lactic acidosis 12/18/2021   Dyspnea on exertion 12/18/2021   Anemia 12/18/2021   Gout 12/18/2021   Acute pain of left shoulder    Arthritis of left shoulder region    S/P reverse total shoulder arthroplasty, left 11/16/2021   Unilateral primary osteoarthritis, right hip 05/09/2021   S/P lumbar spinal fusion 05/09/2021   S/P insertion of spinal cord stimulator 05/09/2021   H/O total knee replacement, bilateral 01/18/2021   Lumbar post-laminectomy syndrome 04/06/2020   Long-term current use of opiate analgesic 11/04/2019   Leg pain, right 10/20/2019   Dysesthesia 10/20/2019   Lateral femoral cutaneous neuropathy, 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 difficile colitis 05/22/2019   Hypophosphatemia 05/21/2019   Syncope 05/20/2019   OSA on CPAP    Acute gastroenteritis    Anemia due to blood loss, acute 06/18/2018    Class: Acute   Plantar fasciitis of left foot 06/16/2018    Class: Chronic   Tendonitis, Achilles, right 06/16/2018    Class: Chronic   Osteochondral talar dome lesion 06/16/2018    Class: Chronic  Deep incisional surgical site infection    Superficial incisional surgical site infection 06/11/2018    Class: Acute   Right ankle effusion 06/11/2018    Class: Acute   Morbid (severe) obesity due to excess calories (HCC) 05/29/2018    Class: Chronic   Spondylolisthesis, lumbar region 05/27/2018    Class: Chronic   Spinal stenosis of lumbar region 05/27/2018    Class: Chronic   Urinary tract infection 05/27/2018    Class: Acute   Fusion of spine of lumbar region 05/27/2018   Pain of right hip joint 07/03/2017   Back  pain 03/27/2017   Chronic right shoulder pain 02/13/2017   History of rotator cuff tear 11/30/2016   Impingement syndrome of right shoulder 11/30/2016   Exacerbation of asthma 07/18/2016   Acute hypokalemia 07/18/2016   Hypertension 07/18/2016   Depression with anxiety 07/18/2016   Hypothyroidism 07/18/2016   Hyperglycemia 07/18/2016   Asthma, chronic 07/18/2016   Surgical wound dehiscence left hip; questionable superficial infection 11/10/2015   Postoperative wound infection 11/10/2015   Osteoarthritis of left hip 09/09/2015   Status post total replacement of left hip 09/09/2015   Obesity (BMI 35.0-39.9 without comorbidity) 04/28/2013   Past Medical History:  Diagnosis Date   Anemia    taking iron  now. pt states having no current issues 09/02/2015   Anginal pain (HCC)    pt states experiences chest wall pain pt states related to her asthma    Anxiety    with MRI's   Arthritis    Everywhere   Asthma    Depression    Dizziness    GERD (gastroesophageal reflux disease)    Headache(784.0)    HX OF MIGRAINES   History of bronchitis    Hypertension    Hypothyroidism    takes levothyroxen   OSA on CPAP    wears cpap   Shortness of breath    with exertion   Spinal cord stimulator status    Wears glasses     Family History  Problem Relation Age of Onset   Cancer Mother        colon   Epilepsy Mother    Cancer Father        prostate   Diabetes Father    Hypertension Father    Hypertension Maternal Aunt    Diabetes Maternal Aunt    Hypertension Paternal Aunt     Past Surgical History:  Procedure Laterality Date   APPLICATION OF WOUND VAC  11/16/2021   Procedure: APPLICATION OF WOUND VAC;  Surgeon: Marilyn Cordella Hamilton, MD;  Location: [facility] OR;  Service: Orthopedics;;   APPLICATION OF WOUND VAC Left 12/01/2021   Procedure: APPLICATION OF WOUND VAC;  Surgeon: Marilyn Cordella Hamilton, MD;  Location: [facility] OR;  Service: Orthopedics;  Laterality: Left;   BACK SURGERY      CESAREAN SECTION     times 2   CHOLECYSTECTOMY     COLONOSCOPY  2020   ENDOMETRIAL ABLATION     I & D EXTREMITY Left 12/06/2021   Procedure: LEFT SHOULDER DEBRIDEMENT AND WOUND CLOSURE;  Surgeon: Marilyn Jerona GAILS, MD;  Location: [facility] OR;  Service: Orthopedics;  Laterality: Left;   I & D EXTREMITY Left 12/20/2021   Procedure: LEFT SHOULDER DEBRIDEMENT;  Surgeon: Marilyn Jerona GAILS, MD;  Location: [facility] OR;  Service: Orthopedics;  Laterality: Left;   I & D EXTREMITY Left 12/22/2021   Procedure: LEFT SHOULDER DEBRIDEMENT;  Surgeon: Marilyn Jerona GAILS, MD;  Location: [facility] OR;  Service:  Orthopedics;  Laterality: Left;   INCISION AND DRAINAGE Left 12/01/2021   Procedure: LEFT SHOULDER IRRIGATION AND EXCISIONAL DEBRIDEMENT;  Surgeon: Marilyn Cordella Hamilton, MD;  Location: [facility] OR;  Service: Orthopedics;  Laterality: Left;   INCISION AND DRAINAGE HIP Left 11/10/2015   Procedure: IRRIGATION AND DEBRIDEMENT LEFT HIP INCISION;  Surgeon: Lonni CINDERELLA Poli, MD;  Location: [facility] OR;  Service: Orthopedics;  Laterality: Left;   JOINT REPLACEMENT  2011   total left knee   KNEE ARTHROPLASTY  04/23/2012   right    KNEE ARTHROSCOPY     LUMBAR LAMINECTOMY/DECOMPRESSION MICRODISCECTOMY N/A 06/23/2019   Procedure: RIGHT LUMBAR FIVE THROUGH SACRAL ONE PARTIAL HEMILAMINECTOMY WITH RIGHT LUMBAR FIVE FORAMINOTOMY;  Surgeon: Lucilla Lynwood BRAVO, MD;  Location: [facility] OR;  Service: Orthopedics;  Laterality: N/A;   LUMBAR WOUND DEBRIDEMENT N/A 06/11/2018   Procedure: LUMBAR WOUND DEBRIDEMENT;  Surgeon: Lucilla Lynwood BRAVO, MD;  Location: [facility] OR;  Service: Orthopedics;  Laterality: N/A;   RADIOLOGY WITH ANESTHESIA N/A 09/09/2018   Procedure: LUMBER SPINE WITHOUT CONTRAST;  Surgeon: Radiologist, Medication, MD;  Location: [facility] OR;  Service: Radiology;  Laterality: N/A;   REVERSE SHOULDER ARTHROPLASTY Left 11/16/2021   Procedure: LEFT REVERSE SHOULDER ARTHROPLASTY;  Surgeon: Marilyn Cordella Hamilton, MD;  Location: [facility] OR;  Service: Orthopedics;  Laterality: Left;    REVERSE SHOULDER ARTHROPLASTY Right 11/14/2023   Procedure: RIGHT REVERSE SHOULDER ARTHROPLASTY;  Surgeon: Marilyn Cordella Hamilton, MD;  Location: [facility] OR;  Service: Orthopedics;  Laterality: Right;   ROTATOR CUFF REPAIR     left    SHOULDER SURGERY     right to repair ligament tear   SPINAL CORD STIMULATOR IMPLANT  08/02/2020   [facility]   TOTAL HIP ARTHROPLASTY Left 09/09/2015   Procedure: LEFT TOTAL HIP ARTHROPLASTY ANTERIOR APPROACH;  Surgeon: Lonni CINDERELLA Poli, MD;  Location: [facility] ORS;  Service: Orthopedics;  Laterality: Left;   TOTAL KNEE ARTHROPLASTY  04/23/2012   Procedure: TOTAL KNEE ARTHROPLASTY;  Surgeon: Jerona LULLA Sage, MD;  Location: [facility] OR;  Service: Orthopedics;  Laterality: Right;  Right Total Knee Arthroplasty   TUBAL LIGATION  1996   Social History   Occupational History   Not on file  Tobacco Use   Smoking status: Former    Current packs/day: 0.00    Average packs/day: 0.5 packs/day for 4.0 years (2.0 ttl pk-yrs)    Types: Cigarettes    Start date: 09/18/1983    Quit date: 09/18/1987    Years since quitting: 36.6   Smokeless tobacco: Never  Vaping Use   Vaping status: Never Used  Substance and Sexual Activity   Alcohol  use: No   Drug use: No   Sexual activity: Yes    Birth control/protection: Surgical

## 2024-06-21 ENCOUNTER — Other Ambulatory Visit: Payer: Self-pay | Admitting: Orthopedic Surgery

## 2024-06-22 ENCOUNTER — Other Ambulatory Visit: Payer: Self-pay | Admitting: Radiology

## 2024-06-22 NOTE — Telephone Encounter (Signed)
 Okay to refill this but 1 time but she really needs to have her primary care fill it moving forward thanks

## 2024-07-17 ENCOUNTER — Encounter (HOSPITAL_BASED_OUTPATIENT_CLINIC_OR_DEPARTMENT_OTHER): Payer: Self-pay | Admitting: Emergency Medicine

## 2024-07-17 ENCOUNTER — Emergency Department (HOSPITAL_BASED_OUTPATIENT_CLINIC_OR_DEPARTMENT_OTHER): Admitting: Radiology

## 2024-07-17 ENCOUNTER — Emergency Department (HOSPITAL_BASED_OUTPATIENT_CLINIC_OR_DEPARTMENT_OTHER)
Admission: EM | Admit: 2024-07-17 | Discharge: 2024-07-17 | Disposition: A | Discharge status: Home / Self Care | Attending: Emergency Medicine | Admitting: Emergency Medicine

## 2024-07-17 ENCOUNTER — Other Ambulatory Visit: Payer: Self-pay

## 2024-07-17 DIAGNOSIS — Z7982 Long term (current) use of aspirin: Secondary | ICD-10-CM | POA: Diagnosis not present

## 2024-07-17 DIAGNOSIS — M25551 Pain in right hip: Secondary | ICD-10-CM | POA: Diagnosis present

## 2024-07-17 DIAGNOSIS — I129 Hypertensive chronic kidney disease with stage 1 through stage 4 chronic kidney disease, or unspecified chronic kidney disease: Secondary | ICD-10-CM | POA: Insufficient documentation

## 2024-07-17 DIAGNOSIS — N1831 Chronic kidney disease, stage 3a: Secondary | ICD-10-CM | POA: Diagnosis not present

## 2024-07-17 DIAGNOSIS — Z79899 Other long term (current) drug therapy: Secondary | ICD-10-CM | POA: Diagnosis not present

## 2024-07-17 DIAGNOSIS — E039 Hypothyroidism, unspecified: Secondary | ICD-10-CM | POA: Diagnosis not present

## 2024-07-17 DIAGNOSIS — Z91040 Latex allergy status: Secondary | ICD-10-CM | POA: Insufficient documentation

## 2024-07-17 DIAGNOSIS — J45909 Unspecified asthma, uncomplicated: Secondary | ICD-10-CM | POA: Diagnosis not present

## 2024-07-17 DIAGNOSIS — Z96642 Presence of left artificial hip joint: Secondary | ICD-10-CM | POA: Insufficient documentation

## 2024-07-17 MED ORDER — DICLOFENAC SODIUM 1 % EX GEL
4.0000 g | Freq: Once | CUTANEOUS | Status: AC
  Administered 2024-07-17: 4 g via TOPICAL
  Filled 2024-07-17: qty 100

## 2024-07-17 MED ORDER — HYDROMORPHONE HCL 1 MG/ML IJ SOLN
0.5000 mg | Freq: Once | INTRAMUSCULAR | Status: DC
Start: 2024-07-17 — End: 2024-07-17

## 2024-07-17 MED ORDER — LIDOCAINE 5 % EX PTCH: 1.0000 | MEDICATED_PATCH | CUTANEOUS | 0 refills | Status: AC

## 2024-07-17 MED ORDER — LIDOCAINE 5 % EX PTCH
1.0000 | MEDICATED_PATCH | CUTANEOUS | Status: DC
  Administered 2024-07-17: 1 via TRANSDERMAL
  Filled 2024-07-17: qty 1

## 2024-07-17 NOTE — ED Provider Notes (Signed)
 [place] EMERGENCY DEPARTMENT AT [facility] Provider Note   CSN: [id number] Arrival date & time: 07/17/24  1122     Patient presents with: Hip Pain   Marilyn [name] is a 53 y.o. female who presents to the emergency department with a chief complaint of severe right hip pain.  Patient states that she has had mild to moderate right hip pain for quite some time however last night her pain increased to severe.  Patient states that since then she has been unable to walk normally.  Denies radiating numbness/tingling.  Denies acute trauma/injury.  Patient has a known history of bilateral osteoarthritis in her hips with a previous left hip replacement completed.  Patient states that she is ambulatory with significant discomfort.  Patient states that she saw her spine specialist a few weeks ago and was prescribed a prednisone  taper because they felt the pain may be coming from her back as well as her hip.  Has medical history significant for spinal stenosis of lumbar region, morbid obesity, osteoarthritis of left hip, total hip replacement of left hip, hypertension, hypothyroidism, asthma, rotator cuff tear, pain of right hip joint, spondylolisthesis, fusion of spine of lumbar region, acute renal failure superimposed on stage IIIa chronic kidney disease, etc.    Hip Pain       Prior to Admission medications   Medication Sig Start Date End Date Taking? Authorizing Provider  lidocaine  (LIDODERM ) 5 % Place 1 patch onto the skin daily. Remove & Discard patch within 12 hours or as directed by MD, place over intact skin for help with R hip pain 07/17/24  Yes Kemani Heidel F, PA-C  albuterol  (VENTOLIN  HFA) 108 (90 Base) MCG/ACT inhaler Inhale 1-2 puffs into the lungs every 6 (six) hours as needed for wheezing or shortness of breath. 12/09/19   Henderly, Britni A, PA-C  allopurinol  (ZYLOPRIM ) 100 MG tablet Take 1 tablet by mouth twice daily 06/22/24   Addie Cordella Hamilton, MD  amitriptyline   (ELAVIL ) 25 MG tablet Take 25 mg by mouth at bedtime.    [provider]  aspirin  EC 81 MG tablet Take 1 tablet (81 mg total) by mouth daily. Swallow whole. 11/16/23   Barbarann Oneil BROCKS, MD  budesonide  (PULMICORT ) 0.5 MG/2ML nebulizer solution Take 0.5 mg by nebulization 2 (two) times daily as needed. 01/15/23   [provider]  buPROPion  (WELLBUTRIN  XL) 150 MG 24 hr tablet Take 150 mg by mouth daily. Take with 300 mg to equal 450 08/27/23 11/25/23  [provider]  buPROPion  (WELLBUTRIN  XL) 300 MG 24 hr tablet Take 300 mg by mouth daily. Take with 150 mg to equal 450 mg 08/27/23 11/25/23  [provider]  celecoxib  (CELEBREX ) 100 MG capsule Take 1 capsule (100 mg total) by mouth 2 (two) times daily. 11/16/23   Barbarann Oneil BROCKS, MD  Cetirizine HCl (ZYRTEC ALLERGY) 10 MG CAPS Take by mouth. Patient not taking: Reported on 11/08/2023 04/30/23   [provider]  chlorhexidine  (HIBICLENS ) 4 % external liquid Apply topically as directed for 30 doses. Use as directed daily for 5 days every other week for 6 weeks. 11/16/23   Barbarann Oneil BROCKS, MD  cyclobenzaprine  (FLEXERIL ) 10 MG tablet Take 1 tablet by mouth 3 (three) times daily as needed. 01/28/23   [provider]  cycloSPORINE  (RESTASIS ) 0.05 % ophthalmic emulsion Place 1 drop into both eyes 2 (two) times daily as needed (dry eyes). Patient not taking: Reported on 11/08/2023    [provider]  docusate sodium  (COLACE) 100 MG capsule Take 1 capsule (100 mg total) by mouth 2 (two) times daily. 11/16/23   Barbarann Oneil BROCKS, MD  doxycycline  (VIBRA -TABS) 100 MG tablet Take 1 tablet (100 mg total) by mouth 2 (two) times daily. 12/06/23   Addie Cordella Hamilton, MD  doxycycline  (VIBRAMYCIN ) 100 MG capsule Take 1 capsule (100 mg total) by mouth 2 (two) times daily. 11/29/23   Magnant, Charles L, PA-C  DULoxetine  (CYMBALTA ) 60 MG capsule Take 60 mg by mouth 2 (two) times daily. Patient not taking: Reported on 11/08/2023 05/02/21    [provider]  Ferrous Gluconate  324 (37.5 Fe) MG TABS Take 324 mg by mouth daily. 02/14/20   [provider]  furosemide  (LASIX ) 20 MG tablet Take 20 mg by mouth 2 (two) times daily as needed for fluid or edema. 10/29/23   [provider]  hydrochlorothiazide  (HYDRODIURIL ) 25 MG tablet Take 25 mg by mouth daily.    [provider]  HYDROcodone -acetaminophen  (NORCO/VICODIN) 5-325 MG tablet Take 1 tablet by mouth every 6 (six) hours as needed. 01/06/24   Harden Jerona GAILS, MD  ipratropium-albuterol  (DUONEB) 0.5-2.5 (3) MG/3ML SOLN Inhale 3 mLs into the lungs every 4 (four) hours as needed (SOB/Wheezing). 02/15/23   [provider]  levothyroxine  (SYNTHROID ) 150 MCG tablet Take 150 mcg by mouth daily before breakfast. 05/18/19   [provider]  naloxone  (NARCAN ) nasal spray 4 mg/0.1 mL Place into the nose. 08/21/22   [provider]  nystatin  (MYCOSTATIN /NYSTOP ) powder Apply 1 Application topically 3 (three) times daily. 11/06/22   [provider]  omeprazole (PRILOSEC) 40 MG capsule Take 40 mg by mouth daily.    [provider]  oxybutynin  (DITROPAN  XL) 15 MG 24 hr tablet Take 15 mg by mouth daily.  05/31/17   [provider]  oxyCODONE  (OXY IR/ROXICODONE ) 5 MG immediate release tablet Take 1 tablet (5 mg total) by mouth every 6 (six) hours as needed for moderate pain (pain score 4-6) (pain score 4-6). 11/29/23   Magnant, Charles L, PA-C  pilocarpine  (SALAGEN ) 5 MG tablet Take 5 mg by mouth 3 (three) times daily. 08/24/23   [provider]  pregabalin  (LYRICA ) 100 MG capsule Take 100 mg by mouth 3 (three) times daily.    [provider]  topiramate  (TOPAMAX ) 25 MG tablet Take 25 mg by mouth daily.    [provider]  traZODone  (DESYREL ) 50 MG tablet Take 50-100 mg by mouth at bedtime.    [provider]  vitamin B-12 (CYANOCOBALAMIN ) 250 MCG tablet Take 250 mcg by mouth daily.     [provider]    Allergies: Lisinopril, Bee venom, Bupropion , Latex, Propoxyphene, Acetic acid, Methadone, Codeine , Meloxicam, Morphine  and codeine , Tegaderm ag mesh [silver], and Tomato    Review of Systems  Musculoskeletal:  Positive for arthralgias (R hip pain).    Updated Vital Signs BP (!) 162/88   Pulse (!) 56   Temp 97.8 F (36.6 C) (Oral)   Resp 16   Ht 5' 2 (1.575 m)   Wt (!) 158.8 kg   LMP  (LMP Unknown) Comment: tubal ligation  SpO2 99%   BMI 64.02 kg/m   Physical Exam Vitals and nursing note reviewed.  Constitutional:      General: She is awake. She is not in acute distress.    Appearance: Normal appearance. She is not ill-appearing, toxic-appearing or diaphoretic.  HENT:     Head: Normocephalic and atraumatic.  Eyes:  General: No scleral icterus. Pulmonary:     Effort: Pulmonary effort is normal. No respiratory distress.  Abdominal:     General: Abdomen is flat. There is no distension.     Palpations: Abdomen is soft.     Tenderness: There is no abdominal tenderness. There is no right CVA tenderness, left CVA tenderness, guarding or rebound.  Musculoskeletal:     Cervical back: Normal range of motion.     Right lower leg: No edema.     Left lower leg: No edema.     Comments: Tenderness with palpation of right hip joint and anterior right hip no tenderness to palpation of cervical, thoracic, lumbar spine, no femur tenderness, no right knee tenderness  Symmetrical strength of bilateral lower extremities, lower right extremity neurovascularly intact, patient able to hold right leg off the bed against resistance as well as gravity  Patient ambulatory without assistance however with significant discomfort   Skin:    General: Skin is warm.     Capillary Refill: Capillary refill takes less than 2 seconds.     Comments: No overlying skin changes  Neurological:     General: No focal deficit present.     Mental Status: She is alert and oriented to  person, place, and time.  Psychiatric:        Mood and Affect: Mood normal.        Behavior: Behavior normal. Behavior is cooperative.     (all labs ordered are listed, but only abnormal results are displayed) Labs Reviewed - No data to display  EKG: None  Radiology: DG Hip Unilat  With Pelvis 2-3 Views Right Result Date: 07/17/2024 CLINICAL DATA:  Right hip pain EXAM: DG HIP (WITH OR WITHOUT PELVIS) 3V*R* COMPARISON:  Right hip radiograph dated 05/07/2023 FINDINGS: There is no evidence of hip fracture or dislocation. Mild degenerative changes of the right hip. Postsurgical changes of left hip arthroplasty. Partially imaged hardware appears intact and well seated. Lower lumbar spinal fusion. Hardware appears intact. IMPRESSION: 1. No acute fracture or dislocation. 2. Mild degenerative changes of the right hip. Electronically Signed   By: Limin  Xu M.D.   On: 07/17/2024 12:33     Procedures   Medications Ordered in the ED  diclofenac  Sodium (VOLTAREN ) 1 % topical gel 4 g (4 g Topical Given 07/17/24 1436)                                    Medical Decision Making Amount and/or Complexity of Data Reviewed Radiology: ordered.  Risk Prescription drug management.   Patient presents to the ED for concern of right hip pain, this involves an extensive number of treatment options, and is a complaint that carries with it a high risk of complications and morbidity.  The differential diagnosis includes septic arthritis, osteoarthritis, inflammatory arthritis, bursitis, etc.   Co morbidities that complicate the patient evaluation  spinal stenosis of lumbar region, morbid obesity, osteoarthritis of left hip, total hip replacement of left hip, hypertension, hypothyroidism, asthma, rotator cuff tear, pain of right hip joint, spondylolisthesis, fusion of spine of lumbar region, acute renal failure superimposed on stage IIIa chronic kidney disease   Imaging Studies ordered:  I ordered  imaging studies including x-ray of right hip I independently visualized and interpreted imaging which showed mild degenerative changes of right hip I agree with the radiologist interpretation  Medicines ordered and prescription drug management:  I  ordered medication including Voltaren  gel, lidocaine  patch for right hip pain Reevaluation of the patient after these medicines showed that the patient improved I have reviewed the patients home medicines and have made adjustments as needed   Test Considered:  None   Critical Interventions:  None   Problem List / ED Course:  53 year old female, right hip pain, known arthritis, denies trauma/injury, no fever or chills, no IV drug use On physical exam patient overall well-appearing and asymptomatic at rest however pain of right hip occurs with movement, specifically weightbearing and external rotation, tenderness to palpation of right hip joint and anterior right hip area X-rays today show mild to moderate osteoarthritis Patient symptomatically treated with topical Voltaren  gel as well as lidocaine  patches, symptomatic treatment for this patient was extremely limited due to multiple outpatient pain medications, patient has recently completed a course of steroids, is already on narcotic pain medication, already taking Tylenol , has a history of kidney dysfunction and is unable to tolerate NSAID therapy, etc. Slightly improved on reassessment, educated on findings Recommend follow-up with orthopedics Precautions given Patient discharged Most likely diagnosis at this time is arthritis for the right hip with acute flare, patient ambulatory prior to discharge, patient discharged with voltaren  gel and lidoderm  patch   Reevaluation:  After the interventions noted above, I reevaluated the patient and found that they have :improved   Social Determinants of Health:  none   Dispostion:  After consideration of the diagnostic results and the  patients response to treatment, I feel that the patent would benefit from discharge and outpatient therapy as described, follow-up with orthopedics as soon as possible for ongoing right hip pain.       Final diagnoses:  Right hip pain    ED Discharge Orders          Ordered    lidocaine  (LIDODERM ) 5 %  Every 24 hours        07/17/24 1452               Auriah Hollings F, PA-C 07/17/24 2037    Levander Houston, MD 07/19/24 [phone]

## 2024-07-17 NOTE — ED Triage Notes (Signed)
 Pt caox4 c/o R hip pain that has been hurting for a while but worsened last night to the point of not being able to walk. Denies any recent trauma/injury. Last took hydrocodone  at 1045.

## 2024-07-17 NOTE — Discharge Instructions (Addendum)
 It was a pleasure taking care of you today.  Based on your history, physical exam, as well as imaging I feel you are safe for discharge.  Based on your physical exam I am suspicious that your right hip pain is due to an arthritis flare.  Because of this I recommend that you follow-up with your orthopedic team as soon as possible, preferably next week.  Please continue your prescribed pain medications at home.  You have also been discharged with topical Voltaren  gel and given a prescription for lidocaine  patches, please use both of these to try and help with pain as well.  If you experience any of the following symptoms including but not limited to fever, chills, chest pain, shortness of breath, unexplained weakness, groin numbness/tingling, inability to walk, or other concerning symptom please return to the emergency department or seek further medical care.  Please make your primary care provider aware of your visit today and workup.

## 2024-07-17 NOTE — ED Notes (Signed)
 Patient transported to X-ray

## 2024-07-20 ENCOUNTER — Encounter: Payer: Self-pay | Admitting: Radiology

## 2024-07-27 ENCOUNTER — Ambulatory Visit: Admitting: Physician Assistant

## 2024-07-29 ENCOUNTER — Ambulatory Visit (INDEPENDENT_AMBULATORY_CARE_PROVIDER_SITE_OTHER): Admitting: Physician Assistant

## 2024-07-29 DIAGNOSIS — M1611 Unilateral primary osteoarthritis, right hip: Secondary | ICD-10-CM

## 2024-07-29 NOTE — Progress Notes (Signed)
 HPI: Marilyn [name] returns today due to right hip pain.  She reports that on Halloween she woke up and was unable to walk due to right hip pain.  No injury.  She states pain is in her groin area.  She seen in the ER where radiographs were obtained and showed no acute fractures or acute findings.  Films were reviewed by myself today and showed no acute findings.  She has mild hip arthritis with pincer type impingement. We are provided her with oral prednisone , lidocaine  patch and Voltaren  gel which she states none of these helped much.  She is now walking with a cane.  She reports the pain to be 8 out of 10 over the last few days.  No numbness tingling down the leg.  She has some numbness about the hip region though.  She is in water  therapy.  She is nondiabetic  Review systems: See HPI otherwise negative  Physical exam: General well-developed well-nourished female in no acute distress. Respirations: Unlabored Left hip: Good range of motion without pain. Right hip: Good range of motion with pain with extreme internal rotation.  Otherwise fluid movement  Impression: Mild right hip arthritis Obesity  Plan: Will have her undergo an intra-articular injection of the right hip with [name].  Then she will follow-up with [name] in 4 weeks see how she is doing overall.  She is not a surgical candidate given her weight.  Questions were encouraged and answered at length.

## 2024-07-29 NOTE — Addendum Note (Signed)
 Addended by: Antario Yasuda B on: 07/29/2024 12:44 PM   Modules accepted: Orders

## 2024-08-05 ENCOUNTER — Ambulatory Visit: Admitting: Orthopaedic Surgery

## 2024-08-10 ENCOUNTER — Telehealth: Payer: Self-pay | Admitting: Physical Medicine and Rehabilitation

## 2024-08-10 ENCOUNTER — Encounter: Admitting: Physical Medicine and Rehabilitation

## 2024-08-10 NOTE — Telephone Encounter (Signed)
 Pt called wanting  to reschedule her appt she missed this morning. Call back number is [phone]

## 2024-08-18 ENCOUNTER — Encounter: Admitting: Physical Medicine and Rehabilitation
